# Patient Record
Sex: Male | Born: 1955 | Race: Black or African American | Hispanic: No | Marital: Single | Smoking: Current every day smoker
Health system: Southern US, Community
[De-identification: ages and names within clinical notes are randomized; demographics above are authoritative.]

## PROBLEM LIST (undated history)

## (undated) DIAGNOSIS — F329 Major depressive disorder, single episode, unspecified: Secondary | ICD-10-CM

## (undated) DIAGNOSIS — J811 Chronic pulmonary edema: Secondary | ICD-10-CM

## (undated) DIAGNOSIS — R51 Headache: Secondary | ICD-10-CM

## (undated) DIAGNOSIS — R079 Chest pain, unspecified: Secondary | ICD-10-CM

## (undated) DIAGNOSIS — M5030 Other cervical disc degeneration, unspecified cervical region: Secondary | ICD-10-CM

## (undated) DIAGNOSIS — R519 Headache, unspecified: Secondary | ICD-10-CM

## (undated) DIAGNOSIS — I219 Acute myocardial infarction, unspecified: Secondary | ICD-10-CM

## (undated) DIAGNOSIS — I200 Unstable angina: Secondary | ICD-10-CM

## (undated) DIAGNOSIS — M5430 Sciatica, unspecified side: Secondary | ICD-10-CM

## (undated) DIAGNOSIS — I10 Essential (primary) hypertension: Secondary | ICD-10-CM

## (undated) DIAGNOSIS — F32A Depression, unspecified: Secondary | ICD-10-CM

## (undated) DIAGNOSIS — E119 Type 2 diabetes mellitus without complications: Secondary | ICD-10-CM

## (undated) DIAGNOSIS — M549 Dorsalgia, unspecified: Secondary | ICD-10-CM

## (undated) DIAGNOSIS — I2510 Atherosclerotic heart disease of native coronary artery without angina pectoris: Secondary | ICD-10-CM

## (undated) DIAGNOSIS — J9690 Respiratory failure, unspecified, unspecified whether with hypoxia or hypercapnia: Secondary | ICD-10-CM

## (undated) DIAGNOSIS — Z720 Tobacco use: Secondary | ICD-10-CM

## (undated) DIAGNOSIS — F419 Anxiety disorder, unspecified: Secondary | ICD-10-CM

## (undated) DIAGNOSIS — S82209A Unspecified fracture of shaft of unspecified tibia, initial encounter for closed fracture: Secondary | ICD-10-CM

## (undated) DIAGNOSIS — E7800 Pure hypercholesterolemia, unspecified: Secondary | ICD-10-CM

## (undated) DIAGNOSIS — I509 Heart failure, unspecified: Secondary | ICD-10-CM

## (undated) DIAGNOSIS — I639 Cerebral infarction, unspecified: Secondary | ICD-10-CM

## (undated) DIAGNOSIS — K219 Gastro-esophageal reflux disease without esophagitis: Secondary | ICD-10-CM

## (undated) DIAGNOSIS — G8929 Other chronic pain: Secondary | ICD-10-CM

## (undated) DIAGNOSIS — M1990 Unspecified osteoarthritis, unspecified site: Secondary | ICD-10-CM

## (undated) DIAGNOSIS — T300 Burn of unspecified body region, unspecified degree: Secondary | ICD-10-CM

## (undated) DIAGNOSIS — G4730 Sleep apnea, unspecified: Secondary | ICD-10-CM

## (undated) DIAGNOSIS — F99 Mental disorder, not otherwise specified: Secondary | ICD-10-CM

## (undated) DIAGNOSIS — Z95810 Presence of automatic (implantable) cardiac defibrillator: Secondary | ICD-10-CM

## (undated) DIAGNOSIS — I429 Cardiomyopathy, unspecified: Secondary | ICD-10-CM

## (undated) HISTORY — PX: HERNIA REPAIR: SHX51

## (undated) HISTORY — PX: SKIN GRAFT: SHX250

## (undated) HISTORY — PX: INCISIONAL HERNIA REPAIR: SHX193

---

## 1995-10-30 HISTORY — PX: ABDOMINAL EXPLORATION SURGERY: SHX538

## 2006-04-14 ENCOUNTER — Encounter: Payer: Self-pay | Admitting: Emergency Medicine

## 2006-04-14 ENCOUNTER — Inpatient Hospital Stay (HOSPITAL_COMMUNITY): Admission: EM | Admit: 2006-04-14 | Discharge: 2006-04-17 | Payer: Self-pay | Admitting: Emergency Medicine

## 2006-04-14 ENCOUNTER — Ambulatory Visit: Payer: Self-pay | Admitting: *Deleted

## 2006-04-16 ENCOUNTER — Encounter: Payer: Self-pay | Admitting: Internal Medicine

## 2006-05-17 ENCOUNTER — Ambulatory Visit: Payer: Self-pay | Admitting: *Deleted

## 2006-05-31 ENCOUNTER — Ambulatory Visit: Payer: Self-pay | Admitting: Cardiology

## 2007-10-29 ENCOUNTER — Ambulatory Visit: Payer: Self-pay | Admitting: Cardiology

## 2007-11-10 ENCOUNTER — Ambulatory Visit: Payer: Self-pay | Admitting: Cardiology

## 2008-10-12 ENCOUNTER — Ambulatory Visit: Payer: Self-pay | Admitting: Cardiology

## 2011-03-13 NOTE — Assessment & Plan Note (Signed)
[facility]                          [place] [facility] OFFICE NOTE   Daniel Li, Daniel Li                  MRN:          [id number]  DATE:10/29/2007                            DOB:          1956/06/12    REFERRING PHYSICIAN:  Ernestine Conrad, MD   REASON FOR VISIT:  Transfer of medical care from our [place] office  to the [facility] office.   HISTORY OF PRESENT ILLNESS:  The patient is an [place] American male  with prior history of myocardial infarction in June 2007.  The patient  had heart catheterization which showed severe three vessel disease with  preserved LV function.  However, decision was made to treat him  percutaneously.  He underwent bare metal stenting of the distant  circumflex and bare metal stenting to the diagonal branch with total  occluded right collateral.  The patient reports that he has had some  increase symptoms of substernal chest pain when walking. He denies,  however, orthopnea or PND.  He has no palpitations or syncope.  He uses  quite a bit of Naproxen secondary to arthritis of the hip.  Unfortunately, he has ongoing tobacco use.  The patient is also not sure  of his medications in due part to changing insurance.  Apparently, he is  not taking Lopressor, Lisinopril or Cardizem nor his Niacin.   CURRENT MEDICATIONS:  1. Zantac 150 mg p.o. b.i.d.  2. Aspirin 325 mg p.o. daily.  3. Plavix 75 mg p.o. daily.  4. Lipitor 80 mg p.o. daily.  5. Glipizide 10 mg p.o. daily.  6. Lasix 40 mg p.o. daily.  7. Fish oil 1000 mg p.o. daily.  8. Diovan/Hydrochlorothiazide 26/12.5 mg p.o. daily.  9. Protonix 40 mg p.o. daily.  10.Naproxen 5 mg p.o. b.i.d.  11.Flexeril 10 mg p.o. daily.   PHYSICAL EXAMINATION:  VITAL SIGNS:  Blood pressure 150/102, heart rate  108, weight 192 pounds.  NECK:  Normal carotid upstrokes.  No carotid bruits.  LUNGS:  Clear breath sounds bilaterally.  HEART:  Regular rate and rhythm.  Normal S1, S2.  No  murmurs, rubs or  gallops.  ABDOMEN:  Soft, nontender, no rebound or guarding.  Good bowel sounds.  EXTREMITIES:  No cyanosis, clubbing or edema.  NEUROLOGICAL:  The patient is alert, oriented and grossly nonfocal.   PROBLEMS:  1. Coronary artery disease.      a.     Status post stent placement x2 to circumflex and diagonal       vessel.      b.     Significant residual coronary artery disease.      c.     Exertional angina.      d.     Normal LV systolic function __________ .  2. Multiple cardiac risk factors.  3. Dyslipidemia (followed by Dr. Loney Hering).  4. Tobacco use.  5. Hypertension, poorly controlled.   PLAN:  1. The patient will be started back on his previous medications, in      particular, Lopressor and Lisinopril for better blood pressure      control.  2. The patient appears to  be having angina, and we have added Imdur 30      mg p.o. daily to his medical regimen.  He has also been scheduled      for an echocardiogram and Cardiolite study for further evaluation.  3. I will encourage the patient to discontinue tobacco use.  4. The patient will follow up with [place] a few months earlier pending the      results of the above studies.     Learta Codding, MD,FACC  Electronically Signed    GED/MedQ  DD: 10/31/2007  DT: 10/31/2007  Job #: [id number]   cc:   Ernestine Conrad, MD

## 2011-03-16 NOTE — Assessment & Plan Note (Signed)
[facility]                         [place] [facility] OFFICE NOTE   Daniel Li, Daniel Li                  MRN:          [id number]  DATE:05/17/2006                            DOB:          [birth year]    PRIMARY CARE PHYSICIAN:  [facility].  I think he  has only been there one time.   This is a gentleman who has coronary artery disease, status post acute  myocardial infarction in the middle of June.  He had a heart catheterization  which showed severe three vessel coronary disease with preserved LV systolic  function.  The decision was made to treat him with percutaneous  revascularization.  He received a bare metal stent to his distal circumflex  and a bare metal stent to his diagonal branch with an totally occluded right  coronary artery which continues to be occluded.  His ejection fraction was  55% at that point.  He has hypertension, hyperlipidemia, diabetes mellitus,  ongoing tobacco abuse, and a history of alcohol abuse.  He is on a myriad of  medications and complains of a cough ever since he started taking his  lisinopril.   CURRENT MEDICATIONS:  1.  Zantac 150 twice a day.  2.  Aspirin 325 mg once a day.  3.  Lopressor 50 twice a day.  4.  Plavix 75 mg once a day.  5.  Lisinopril 20 mg a day.  6.  Lipitor 80 mg a day.  7.  Cardizem 120 mg a day.  8.  Niaspan 250 mg a day.  9.  Glipizide 10 mg a day.  10. Lasix 40 mg a day.  11. Fish oil 1000 mg once a day.  12. Takes nitroglycerin on an as-needed basis.   Has an occasional episode of chest discomfort, which is not limiting.  He  continues to smoke, unfortunately, and we talked a little bit about that.  His other limitation is that he has some limited financial reserve and is  waiting to start cardiac rehab once this has been approved.   PHYSICAL EXAMINATION:  VITAL SIGNS:  Blood pressure 144/94, pulse 81.  GENERAL:  He is an obese black male in  no apparent distress, alert and  oriented x4.  CHEST:  Decreased breath sounds at the bases.  CARDIOVASCULAR:  Irregular.  He has no jugular venous distention or carotid  bruits.  I do not hear a murmur.  His point of maximal impulse is not  palpable.  His cath site in his groin is well healed.  He has 1+ pulses.  There are no bruits.  EXTREMITIES:  There is no lower extremity edema.   His electrocardiogram shows an old anterior septal myocardial infarction  with no ischemic ST-T wave changes.  His R wave transition is in V4.  I have  no old EKGs for comparison.   So, this is a gentleman with several issues.  His coronary artery disease  appears to be substantial and has not been completely vascularized.  His  discomfort in his chest, however, is [place] Cardiovascular Society Class  I, under reasonable treatment.  His second issue are his multiple cardiac risk factors, which were  previously completely untreated but are now under the care of the [facility] Department.  His blood pressure is not adequately controlled  and will need to address that.  My plan is to stop his lisinopril because of  the cough and add Diovan 160 with 12.5 mg of hydrochlorothiazide in an  attempt to improve his blood pressure management.  We will have to pay close  attention to his potassium and his renal function.  Still, he is coming back  to see [place] in two weeks for blood pressure check.  At that point, we will  check some basic metabolic panel to insure that he has not had any worsening  problems.   With regard to his cigarette smoking, I have encouraged him to stop smoking.  He is pending cardiac rehab, so hopefully  get into a smoking cessation  program when he does that.  His lipids are aggressively treated currently  and will need to check some lipid functions probably in about three months.  Will see him back at that time.                                   Farris Has. Dorethea Clan,  MD   JMH/MedQ  DD:  05/17/2006  DT:  05/17/2006  Job #:  [id number]

## 2011-03-16 NOTE — H&P (Signed)
Daniel Li, Daniel Li           ACCOUNT NO.:  [id number]   MEDICAL RECORD NO.:  [id number]          PATIENT TYPE:  INP   LOCATION:  2020                         FACILITY:  [facility]   PHYSICIAN:  Arvilla Meres, M.D. LHCDATE OF BIRTH:  Sep 24, 1956   DATE OF ADMISSION:  04/14/2006  DATE OF DISCHARGE:                                HISTORY & PHYSICAL   CHIEF COMPLAINT:  Chest pain.   HISTORY OF PRESENT ILLNESS:  This is a 55 year old male with a history of  diabetes who presents with substernal chest pain.  He originally presented  to [facility] emergency room and was transferred to [facility] for further  evaluation.  He describes the chest pain as intense, 10/10, and is  refractory to several doses of nitroglycerin and Tylenol.   PAST MEDICAL HISTORY:  Alcohol abuse and long-standing diabetes.   MEDICATIONS:  The patient is unaware of all of his medications, but thinks  that he takes:  1.  Duragesic patch for back pain.  2.  Glipizide.   ALLERGIES:  No known drug allergies.   SOCIAL HISTORY:  Greater than 40 pack year smoking history, and history of  tobacco abuse.   REVIEW OF SYSTEMS:  Otherwise negative, besides what is reported in the HPI.   PHYSICAL EXAMINATION:  VITAL SIGNS:  Temperature was 110/70 in the right  arm, 107/90 in the left arm, pulse is 80, respiratory rate 18, pulse  oximetry 98% on room air.  NECK:  Supple.  LUNGS:  Clear to auscultation bilaterally.  CARDIOVASCULAR:  Demonstrates a laterally displaced S1 and S2 with no  murmurs, rubs, or gallops.  ABDOMEN:  Soft and nontender.  EXTREMITIES:  No clubbing, cyanosis, or edema.  NEUROLOGIC:  Cranial nerves II-XII intact with no focal deficits.  Pulses  are 2+ throughout with normal contour.   ELECTROCARDIOGRAM:  Normal sinus rhythm with no ST segment changes.   ASSESSMENT AND PLAN:  This is a 55 year old gentleman with atypical chest  pain.  A primary concern was given that the intensity of his chest  pain  seems out of proportion to his physical examination.  Will rule out for  aortic dissection with a CT of the thorax.  I have a low suspicion for this,  but will make it a consideration.  Other  possibilities include GERD.  Will start the patient on a proton pump  inhibitor, and will consider acute coronary syndrome with ruling out enzymes  and telemetry.  Will also place on sliding scale insulin for his diabetes,  check a hemoglobin A1C, a thyroid panel, lipid panel, initiate ACE inhibitor  and beta blockers.      [name], MD       ______________________________  Arvilla Meres, M.D. [facility]    DBH/MEDQ  D:  04/14/2006  T:  04/15/2006  Job:  [id number]

## 2011-03-16 NOTE — Discharge Summary (Signed)
NAMEJAMY, CLECKLER           ACCOUNT NO.:  [id number]   MEDICAL RECORD NO.:  [id number]          PATIENT TYPE:  INP   LOCATION:  6524                         FACILITY:  [facility]   PHYSICIAN:  Charlton Haws, M.D.     DATE OF BIRTH:  04/12/1956   DATE OF ADMISSION:  04/14/2006  DATE OF DISCHARGE:  04/17/2006                                 DISCHARGE SUMMARY   PHYSICIAN:  Primary cardiologist will be either Dr. Dorethea Clan or Dr. Dietrich Pates  in [place].   PRINCIPAL DIAGNOSIS:  Non-ST elevation myocardial infarction/coronary artery  disease.   OTHER DIAGNOSES:  1.  Hypertension.  2.  Hyperlipidemia.  3.  Type 2 diabetes mellitus.  4.  Ongoing tobacco abuse.  5.  Ethanol abuse.   ALLERGIES:  NO KNOWN DRUG ALLERGIES.   PROCEDURE:  1.  Left heart cardiac catheterization.  2.  PCI and stenting of the distal left circumflex and first diagonal with      bare-metal stents.   HISTORY OF PRESENT ILLNESS:  This patient is a 55 year old, African-American  male without prior history of CAD who presented to the [facility] emergency  department on April 14, 2006 with complaints of tenderness and chest pain  refractory to sublingual nitroglycerin and Tylenol.  His ECG showed no acute  ST or T-wave changes and he was transferred to [facility] for further  evaluation.   HOSPITAL COURSE:  Following arrival to [facility], he ruled in for non-ST  elevation MI with peak CK of 607, CK-MB of 14.4 and troponin-I of 1.29.  He  was initiated on aspirin, statin, beta blocker and ACE inhibitor therapy.  He underwent left heart cardiac catheterization on April 15, 2006, which  revealed an 80 percent stenosis in the first diagonal and an 80 percent  ostial stenosis in a small second diagonal, an 80 percent stenosis in the  distal second obtuse marginal, and a subtotal occlusion of the  posterolateral artery with right-to-right collaterals.  Ejection fraction  was 55 percent.  It was felt the infarct vessel  was likely the  posterolateral artery, which was not felt to be amenable to PCI.  Films were  reviewed with Dr. Juanda Chance and the decision was made to pursue PCI of the  distal left circumflex and first diagonal.  PCI took place on April 16, 2006  with placement of a 2.75 x 18 millimeter micro-driver MX-squared bare-metal  stent in the second obtuse marginal, and a placement of a 2.25 x 14  millimeter micro-driver MX-squared stent in the first diagonal.  He  tolerated this procedure well and postprocedure, his CK-MB is 3.1.  He has  been ambulating without limitations or recurrent chest discomfort and he is  being discharged home today in satisfactory condition.   DISCHARGE LABORATORY DATA:  Hemoglobin 13.6 and hematocrit 41.0, white blood  cell count 7.9, platelet count 198,000.  Sodium 144, potassium 4.0, chloride  106, CO2 of 31, BUN 8 and creatinine 1.2, glucose 102, calcium 9.5.  Peak CK  607, CK-MB 14.4, peak troponin-I 1.29.  Total cholesterol 221, triglycerides  151, HDL 40, LDL 151.  TSH  1.389.   DISPOSITION:  The patient is being discharged home today in good condition.   FOLLOW UP:  Follow-up appointment:  The patient will follow-up at [facility] in [place] in approximately two weeks.  He will be  contacted with an appointment.   DISCHARGE MEDICATIONS:  1.  Aspirin 325 mg p.o. daily.  2.  Plavix 75 mg p.o. daily.  3.  Lopressor 100 mg half a tablet p.o. b.i.d.  4.  Lisinopril 20 mg daily.  5.  Lipitor 80 mg daily.  6.  Glipizide as previously prescribed.  7.  Nitroglycerin 0.4 mg sublingual p.r.n. chest pain.   Outstanding labs and studies:  None.  Duration of discharge encounter:  45  minutes including physician time.      Ok Anis, NP    ______________________________  Charlton Haws, M.D.    CRB/MEDQ  D:  04/17/2006  T:  04/17/2006  Job:  [id number]   cc:   [facility]  [place]

## 2011-03-16 NOTE — Cardiovascular Report (Signed)
NAMEOAK, DOREY           ACCOUNT NO.:  [id number]   MEDICAL RECORD NO.:  [id number]          PATIENT TYPE:  INP   LOCATION:  6524                         FACILITY:  [facility]   PHYSICIAN:  Charlies Constable, M.D. [facility] DATE OF BIRTH:  May 15, 1956   DATE OF PROCEDURE:  04/16/2006  DATE OF DISCHARGE:                              CARDIAC CATHETERIZATION   PROCEDURES:  Percutaneous coronary intervention.   HISTORY:  Mr. Nielson is 55 years old and was admitted to the hospital  with chest pain, and his enzymes returned positive consistent with a non-ST-  elevation infarction.  He was studied yesterday by Dr. Eden Emms and found to  have total occlusion of the distal right coronary artery, 95% stenosis in  the distal circumflex artery, and 90% stenosis in the first diagonal branch  which is a moderate-sized vessel, and 95% stenosis at the ostium of second  diagonal branch which was a small vessel.  We elected to perform  intervention on the distal circumflex artery and first diagonal branch  today.   The procedure was performed via the right femoral artery using an arterial  sheath and a 6-French CLS-4 guiding catheter with side holes.  We passed a  Prowater wire down the circumflex artery across the lesion without  difficulty.  We predilated with a 2.25 x 50 mm Maverick, performing one  inflation up to 8 atmospheres for 30 seconds.  We then deployed a 2.75 x 18  mm driver bare metal stent with one inflation of 11 atmospheres for 30  seconds, and we postdilated 3.0 x 15 mm Quantum Maverick, performing two  inflations up to 15 atmospheres for 30 seconds.   We then approached the first diagonal branch of the LAD.  We wired the  vessel with the Prowater wire.  We predilated with a 2.25 x 50 mm Maverick,  performing one inflation to 7 atmospheres for 30 seconds.  We then deployed  a 2.25 x 14 mm driver, deploying this with one inflation of 8 atmospheres  for 30 seconds.  We postdilated with a  2.25 x 12 mm Quantum Maverick,  performing two inflations up to 15 atmospheres for 30 seconds.  Final  diagnostic study was then performed through the guiding catheter.  The  patient tolerated the procedure well and left the laboratory in satisfactory  condition.   RESULTS:  Initially stenosis in the  distal circumflex artery was estimated  at 95%.  Following stenting, this improved to 0%.   Initially stenosis in the first diagonal branch, which was just past the  ostium, was estimated at 95%.  Following stenting, this improved to 0%.   CONCLUSION:  1.  Successful percutaneous intervention of the lesion in the distal      circumflex artery using a driver bare metal stent with improvement in      stent narrowing from 95% to 0%.  2.  Successful percutaneous intervention of the lesion in the first diagonal      branch of the left anterior descending  using a driver bare metal stent      with improvement in stent narrowing from 95%  to 0%.   DISPOSITION:  The patient was then returned further observation.           ______________________________  Charlies Constable, M.D. [facility]     BB/MEDQ  D:  04/16/2006  T:  04/16/2006  Job:  [id number]   cc:   Charlton Haws, M.D.  [address]  [address]  [place]  [place] [address]   Cardiopulmonary Lab

## 2011-03-16 NOTE — Cardiovascular Report (Signed)
NAMEPRATHAM, CASSATT           ACCOUNT NO.:  [id number]   MEDICAL RECORD NO.:  [id number]          PATIENT TYPE:  INP   LOCATION:  2020                         FACILITY:  [facility]   PHYSICIAN:  Charlton Haws, M.D.     DATE OF BIRTH:  1956/10/01   DATE OF PROCEDURE:  DATE OF DISCHARGE:                              CARDIAC CATHETERIZATION   Ms. Want is a 54 year old patient with diabetes.  He was admitted to  the hospital with a probable subendocardial MI and chest pain.  Catheterization was indicated to clarify degree of coronary artery stenosis.  Catheterization was done using a 6-French sheath in the right femoral  artery.   The left main coronary artery had a 20% distal stenosis.   The left anterior descending artery proper was normal.   The first diagonal branch had 80% multiple discrete lesions.   The second diagonal branch was small with an 80% ostial lesion.   The circumflex coronary artery was a large caliber vessel with 40-50% distal  disease.  A second obtuse marginal branch had an 80% eccentric lesion.  The  superior branch had 50% discrete lesions.   The right coronary artery was dominant.  There were 20-30% multiple discrete  lesions distally.  The posterolateral branch appeared subtotally occluded  with right-to-right collaterals.   I suspect this was his infarct vessel.  It did not look very amenable to  percutaneous intervention.   RIGHT VENTRICULOGRAPHY:  Right ventriculography showed an EF of 55%.  There  was no gradient across the aortic valve and no MR.  Aortic pressure was  142/87.  LV pressure was 140/19.   IMPRESSION:  I suspect the patient's infarct artery was the posterolateral  branch.  I do not think that this should be treated percutaneously; however,  he still has myocardium in jeopardy in the first diagonal branch and obtuse  marginal branch lesions.   We will have our interventionalist review this.  It may be worthwhile to  bring him  back for intervention of the diagonal and/or the obtuse marginal  branch.           ______________________________  Charlton Haws, M.D.     PN/MEDQ  D:  04/15/2006  T:  04/16/2006  Job:  [id number]

## 2012-01-14 ENCOUNTER — Encounter (HOSPITAL_COMMUNITY): Payer: Self-pay | Admitting: Oncology

## 2012-01-14 ENCOUNTER — Emergency Department (HOSPITAL_COMMUNITY)
Admission: EM | Admit: 2012-01-14 | Discharge: 2012-01-14 | Disposition: A | Discharge status: Home / Self Care | Payer: Medicaid Other | Attending: Emergency Medicine | Admitting: Emergency Medicine

## 2012-01-14 ENCOUNTER — Emergency Department (HOSPITAL_COMMUNITY): Payer: Medicaid Other

## 2012-01-14 DIAGNOSIS — M5431 Sciatica, right side: Secondary | ICD-10-CM

## 2012-01-14 DIAGNOSIS — M5430 Sciatica, unspecified side: Secondary | ICD-10-CM | POA: Insufficient documentation

## 2012-01-14 DIAGNOSIS — M545 Low back pain, unspecified: Secondary | ICD-10-CM | POA: Insufficient documentation

## 2012-01-14 DIAGNOSIS — M25559 Pain in unspecified hip: Secondary | ICD-10-CM | POA: Insufficient documentation

## 2012-01-14 DIAGNOSIS — G8929 Other chronic pain: Secondary | ICD-10-CM

## 2012-01-14 DIAGNOSIS — M79609 Pain in unspecified limb: Secondary | ICD-10-CM | POA: Insufficient documentation

## 2012-01-14 HISTORY — DX: Cerebral infarction, unspecified: I63.9

## 2012-01-14 HISTORY — DX: Unspecified fracture of shaft of unspecified tibia, initial encounter for closed fracture: S82.209A

## 2012-01-14 HISTORY — DX: Burn of unspecified body region, unspecified degree: T30.0

## 2012-01-14 MED ORDER — HYDROCODONE-ACETAMINOPHEN 5-325 MG PO TABS
1.0000 | ORAL_TABLET | Freq: Once | ORAL | Status: AC
  Administered 2012-01-14: 1 via ORAL
  Filled 2012-01-14: qty 1

## 2012-01-14 MED ORDER — GABAPENTIN 100 MG PO CAPS: 100.0000 mg | ORAL_CAPSULE | Freq: Three times a day (TID) | ORAL | Status: DC

## 2012-01-14 MED ORDER — HYDROCODONE-ACETAMINOPHEN 5-325 MG PO TABS: ORAL_TABLET | ORAL | Status: DC

## 2012-01-14 NOTE — Discharge Instructions (Signed)
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
## 2012-03-26 ENCOUNTER — Other Ambulatory Visit: Payer: Self-pay

## 2012-03-26 ENCOUNTER — Encounter (HOSPITAL_COMMUNITY)
Admission: EM | Disposition: A | Discharge status: Home / Self Care | Payer: Self-pay | Source: Home / Self Care | Attending: Cardiovascular Disease

## 2012-03-26 ENCOUNTER — Encounter (HOSPITAL_COMMUNITY): Payer: Self-pay | Admitting: *Deleted

## 2012-03-26 ENCOUNTER — Emergency Department (HOSPITAL_COMMUNITY): Payer: Medicaid Other

## 2012-03-26 ENCOUNTER — Inpatient Hospital Stay (HOSPITAL_COMMUNITY)
Admission: EM | Admit: 2012-03-26 | Discharge: 2012-03-28 | DRG: 249 | Disposition: A | Discharge status: Home / Self Care | Payer: Medicaid Other | Attending: Cardiovascular Disease | Admitting: Cardiovascular Disease

## 2012-03-26 ENCOUNTER — Ambulatory Visit (HOSPITAL_COMMUNITY): Admit: 2012-03-26 | Payer: Self-pay | Admitting: Cardiovascular Disease

## 2012-03-26 DIAGNOSIS — I2510 Atherosclerotic heart disease of native coronary artery without angina pectoris: Secondary | ICD-10-CM

## 2012-03-26 DIAGNOSIS — F17200 Nicotine dependence, unspecified, uncomplicated: Secondary | ICD-10-CM | POA: Diagnosis present

## 2012-03-26 DIAGNOSIS — Z7982 Long term (current) use of aspirin: Secondary | ICD-10-CM

## 2012-03-26 DIAGNOSIS — I219 Acute myocardial infarction, unspecified: Secondary | ICD-10-CM

## 2012-03-26 DIAGNOSIS — I10 Essential (primary) hypertension: Secondary | ICD-10-CM | POA: Diagnosis present

## 2012-03-26 DIAGNOSIS — E785 Hyperlipidemia, unspecified: Secondary | ICD-10-CM | POA: Diagnosis present

## 2012-03-26 DIAGNOSIS — Z9861 Coronary angioplasty status: Secondary | ICD-10-CM

## 2012-03-26 DIAGNOSIS — E118 Type 2 diabetes mellitus with unspecified complications: Secondary | ICD-10-CM | POA: Diagnosis present

## 2012-03-26 DIAGNOSIS — I2119 ST elevation (STEMI) myocardial infarction involving other coronary artery of inferior wall: Principal | ICD-10-CM | POA: Diagnosis present

## 2012-03-26 DIAGNOSIS — Z8673 Personal history of transient ischemic attack (TIA), and cerebral infarction without residual deficits: Secondary | ICD-10-CM

## 2012-03-26 DIAGNOSIS — Z720 Tobacco use: Secondary | ICD-10-CM | POA: Insufficient documentation

## 2012-03-26 DIAGNOSIS — I213 ST elevation (STEMI) myocardial infarction of unspecified site: Secondary | ICD-10-CM

## 2012-03-26 DIAGNOSIS — I252 Old myocardial infarction: Secondary | ICD-10-CM

## 2012-03-26 DIAGNOSIS — Z955 Presence of coronary angioplasty implant and graft: Secondary | ICD-10-CM

## 2012-03-26 DIAGNOSIS — I498 Other specified cardiac arrhythmias: Secondary | ICD-10-CM | POA: Diagnosis not present

## 2012-03-26 DIAGNOSIS — E7800 Pure hypercholesterolemia, unspecified: Secondary | ICD-10-CM | POA: Insufficient documentation

## 2012-03-26 DIAGNOSIS — E119 Type 2 diabetes mellitus without complications: Secondary | ICD-10-CM | POA: Diagnosis present

## 2012-03-26 HISTORY — DX: Essential (primary) hypertension: I10

## 2012-03-26 HISTORY — DX: Atherosclerotic heart disease of native coronary artery without angina pectoris: I25.10

## 2012-03-26 HISTORY — DX: Pure hypercholesterolemia, unspecified: E78.00

## 2012-03-26 HISTORY — DX: Tobacco use: Z72.0

## 2012-03-26 HISTORY — PX: LEFT HEART CATH: SHX5478

## 2012-03-26 LAB — CARDIAC PANEL(CRET KIN+CKTOT+MB+TROPI)
CK, MB: 10.6 ng/mL (ref 0.3–4.0)
CK, MB: 30.8 ng/mL (ref 0.3–4.0)
CK, MB: 35.3 ng/mL (ref 0.3–4.0)
Relative Index: 3.2 — ABNORMAL HIGH (ref 0.0–2.5)
Total CK: 973 U/L — ABNORMAL HIGH (ref 7–232)
Total CK: 1123 U/L — ABNORMAL HIGH (ref 7–232)
Troponin I: 14.44 ng/mL (ref <0.30)

## 2012-03-26 LAB — CBC
Hemoglobin: 14.4 g/dL (ref 13.0–17.0)
Hemoglobin: 12.9 g/dL — ABNORMAL LOW (ref 13.0–17.0)
MCH: 27.5 pg (ref 26.0–34.0)
MCH: 27 pg (ref 26.0–34.0)
MCHC: 33.4 g/dL (ref 30.0–36.0)
MCHC: 33.2 g/dL (ref 30.0–36.0)
MCV: 82.3 fL (ref 78.0–100.0)
MCV: 81.4 fL (ref 78.0–100.0)
Platelets: 165 10*3/uL (ref 150–400)
RBC: 5.24 MIL/uL (ref 4.22–5.81)
RBC: 4.78 MIL/uL (ref 4.22–5.81)
RDW: 16.3 % — ABNORMAL HIGH (ref 11.5–15.5)
WBC: 11.1 10*3/uL — ABNORMAL HIGH (ref 4.0–10.5)

## 2012-03-26 LAB — BASIC METABOLIC PANEL
CO2: 24 mEq/L (ref 19–32)
Calcium: 8.8 mg/dL (ref 8.4–10.5)
Chloride: 101 mEq/L (ref 96–112)
Creatinine, Ser: 0.9 mg/dL (ref 0.50–1.35)
GFR calc non Af Amer: >90 mL/min (ref >90)
Glucose, Bld: 232 mg/dL — ABNORMAL HIGH (ref 70–99)
Glucose, Bld: 169 mg/dL — ABNORMAL HIGH (ref 70–99)
Sodium: 139 mEq/L (ref 135–145)

## 2012-03-26 LAB — LIPID PANEL
Cholesterol: 236 mg/dL — ABNORMAL HIGH (ref 0–200)
HDL: 60 mg/dL (ref >39)
Total CHOL/HDL Ratio: 3.9 RATIO
Triglycerides: 84 mg/dL (ref <150)
VLDL: 17 mg/dL (ref 0–40)

## 2012-03-26 LAB — POCT I-STAT TROPONIN I: Troponin i, poc: 0.01 ng/mL (ref 0.00–0.08)

## 2012-03-26 LAB — GLUCOSE, CAPILLARY
Glucose-Capillary: 166 mg/dL — ABNORMAL HIGH (ref 70–99)
Glucose-Capillary: 117 mg/dL — ABNORMAL HIGH (ref 70–99)

## 2012-03-26 LAB — PRO B NATRIURETIC PEPTIDE: Pro B Natriuretic peptide (BNP): 25.2 pg/mL (ref 0–125)

## 2012-03-26 LAB — HEMOGLOBIN A1C: Hgb A1c MFr Bld: 6.8 % — ABNORMAL HIGH (ref <5.7)

## 2012-03-26 SURGERY — LEFT HEART CATH
Anesthesia: LOCAL

## 2012-03-26 MED ORDER — SODIUM CHLORIDE 0.9 % IV SOLN
1.0000 mL/kg/h | INTRAVENOUS | Status: AC
  Administered 2012-03-26: 1 mL/kg/h via INTRAVENOUS

## 2012-03-26 MED ORDER — ATORVASTATIN CALCIUM 40 MG PO TABS
40.0000 mg | ORAL_TABLET | Freq: Every day | ORAL | Status: DC
  Administered 2012-03-26: 40 mg via ORAL
  Filled 2012-03-26 (×2): qty 1

## 2012-03-26 MED ORDER — MORPHINE SULFATE 2 MG/ML IJ SOLN: 2.0000 mg | INTRAMUSCULAR | Status: DC | PRN

## 2012-03-26 MED ORDER — NITROGLYCERIN IN D5W 200-5 MCG/ML-% IV SOLN
10.0000 ug/min | INTRAVENOUS | Status: DC
  Administered 2012-03-26: 5 ug/min via INTRAVENOUS
  Filled 2012-03-26: qty 250

## 2012-03-26 MED ORDER — LIDOCAINE HCL (PF) 1 % IJ SOLN
INTRAMUSCULAR | Status: AC
  Filled 2012-03-26: qty 30

## 2012-03-26 MED ORDER — CLOPIDOGREL BISULFATE 300 MG PO TABS
300.0000 mg | ORAL_TABLET | Freq: Every day | ORAL | Status: DC
  Administered 2012-03-26: 300 mg via ORAL
  Filled 2012-03-26 (×2): qty 1

## 2012-03-26 MED ORDER — ACETAMINOPHEN 325 MG PO TABS: 650.0000 mg | ORAL_TABLET | ORAL | Status: DC | PRN

## 2012-03-26 MED ORDER — MIDAZOLAM HCL 2 MG/2ML IJ SOLN
INTRAMUSCULAR | Status: AC
  Filled 2012-03-26: qty 2

## 2012-03-26 MED ORDER — SODIUM CHLORIDE 0.9 % IV SOLN
1000.0000 mL | Freq: Once | INTRAVENOUS | Status: AC
  Administered 2012-03-26: 1000 mL via INTRAVENOUS

## 2012-03-26 MED ORDER — HYDROCODONE-ACETAMINOPHEN 5-325 MG PO TABS: 1.0000 | ORAL_TABLET | ORAL | Status: DC | PRN

## 2012-03-26 MED ORDER — TICAGRELOR 90 MG PO TABS
90.0000 mg | ORAL_TABLET | Freq: Two times a day (BID) | ORAL | Status: DC
  Administered 2012-03-26 – 2012-03-28 (×5): 90 mg via ORAL
  Filled 2012-03-26 (×6): qty 1

## 2012-03-26 MED ORDER — ASPIRIN 81 MG PO CHEW
81.0000 mg | CHEWABLE_TABLET | Freq: Every day | ORAL | Status: DC
  Administered 2012-03-26 – 2012-03-28 (×3): 81 mg via ORAL
  Filled 2012-03-26 (×3): qty 1

## 2012-03-26 MED ORDER — GABAPENTIN 100 MG PO CAPS
100.0000 mg | ORAL_CAPSULE | Freq: Three times a day (TID) | ORAL | Status: DC
  Administered 2012-03-26 – 2012-03-28 (×7): 100 mg via ORAL
  Filled 2012-03-26 (×9): qty 1

## 2012-03-26 MED ORDER — DOPAMINE-DEXTROSE 3.2-5 MG/ML-% IV SOLN
INTRAVENOUS | Status: AC
  Filled 2012-03-26: qty 250

## 2012-03-26 MED ORDER — HEPARIN (PORCINE) IN NACL 100-0.45 UNIT/ML-% IJ SOLN
12.0000 [IU]/kg/h | INTRAMUSCULAR | Status: DC
  Administered 2012-03-26 (×2): 12 [IU]/kg/h via INTRAVENOUS
  Filled 2012-03-26: qty 250

## 2012-03-26 MED ORDER — FENTANYL CITRATE 0.05 MG/ML IJ SOLN
INTRAMUSCULAR | Status: AC
  Filled 2012-03-26: qty 2

## 2012-03-26 MED ORDER — ONDANSETRON HCL 4 MG/2ML IJ SOLN: 4.0000 mg | Freq: Four times a day (QID) | INTRAMUSCULAR | Status: DC | PRN

## 2012-03-26 MED ORDER — NITROGLYCERIN 0.2 MG/ML ON CALL CATH LAB
INTRAVENOUS | Status: AC
  Filled 2012-03-26: qty 1

## 2012-03-26 MED ORDER — BIOTENE DRY MOUTH MT LIQD
15.0000 mL | Freq: Two times a day (BID) | OROMUCOSAL | Status: DC
  Administered 2012-03-26: 15 mL via OROMUCOSAL

## 2012-03-26 MED ORDER — ASPIRIN 81 MG PO CHEW
CHEWABLE_TABLET | ORAL | Status: AC
  Administered 2012-03-26: 324 mg via ORAL
  Filled 2012-03-26: qty 4

## 2012-03-26 MED ORDER — METOPROLOL TARTRATE 12.5 MG HALF TABLET
12.5000 mg | ORAL_TABLET | Freq: Two times a day (BID) | ORAL | Status: DC
  Administered 2012-03-26 – 2012-03-28 (×5): 12.5 mg via ORAL
  Filled 2012-03-26 (×6): qty 1

## 2012-03-26 MED ORDER — INSULIN ASPART 100 UNIT/ML ~~LOC~~ SOLN: 0.0000 [IU] | Freq: Three times a day (TID) | SUBCUTANEOUS | Status: DC

## 2012-03-26 MED ORDER — HEPARIN (PORCINE) IN NACL 2-0.9 UNIT/ML-% IJ SOLN
INTRAMUSCULAR | Status: AC
  Filled 2012-03-26: qty 2000

## 2012-03-26 MED ORDER — GLIPIZIDE ER 10 MG PO TB24
10.0000 mg | ORAL_TABLET | Freq: Every day | ORAL | Status: DC
  Administered 2012-03-26 – 2012-03-28 (×3): 10 mg via ORAL
  Filled 2012-03-26 (×4): qty 1

## 2012-03-26 MED ORDER — SODIUM CHLORIDE 0.9 % IV SOLN: 1000.0000 mL | INTRAVENOUS | Status: DC

## 2012-03-26 MED ORDER — QUETIAPINE FUMARATE ER 50 MG PO TB24
150.0000 mg | ORAL_TABLET | Freq: Every day | ORAL | Status: DC
  Administered 2012-03-26: 150 mg via ORAL
  Filled 2012-03-26 (×2): qty 3

## 2012-03-26 MED ORDER — METOPROLOL TARTRATE 50 MG PO TABS
25.0000 mg | ORAL_TABLET | Freq: Once | ORAL | Status: AC
  Administered 2012-03-26: 25 mg via ORAL
  Filled 2012-03-26: qty 1

## 2012-03-26 MED ORDER — HEPARIN SODIUM (PORCINE) 5000 UNIT/ML IJ SOLN
60.0000 [IU]/kg | Freq: Three times a day (TID) | INTRAMUSCULAR | Status: DC
  Administered 2012-03-26: 5100 [IU] via INTRAVENOUS
  Filled 2012-03-26 (×5): qty 1.02

## 2012-03-26 MED ORDER — MORPHINE SULFATE 2 MG/ML IJ SOLN
2.0000 mg | Freq: Once | INTRAMUSCULAR | Status: AC
  Administered 2012-03-26: 2 mg via INTRAVENOUS
  Filled 2012-03-26: qty 1

## 2012-03-26 MED ORDER — MORPHINE SULFATE 2 MG/ML IJ SOLN
2.0000 mg | Freq: Once | INTRAMUSCULAR | Status: AC
  Administered 2012-03-26: 2 mg via INTRAVENOUS

## 2012-03-26 MED ORDER — BIVALIRUDIN 250 MG IV SOLR
INTRAVENOUS | Status: AC
  Filled 2012-03-26: qty 250

## 2012-03-26 MED ORDER — MORPHINE SULFATE 2 MG/ML IJ SOLN
INTRAMUSCULAR | Status: AC
  Filled 2012-03-26: qty 1

## 2012-03-26 MED ORDER — TICAGRELOR 90 MG PO TABS
ORAL_TABLET | ORAL | Status: AC
  Filled 2012-03-26: qty 2
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
## 2012-03-26 NOTE — ED Notes (Signed)
Initial ECG was hand delivered to Dr. Colon Branch at 00:47. [facility]

## 2012-03-26 NOTE — Progress Notes (Signed)
CARDIAC REHAB PHASE I   PRE:  Rate/Rhythm: 68 SR    BP: sitting 129/90    SaO2: 98 2L, 97 RA  MODE:  Ambulation: 280 ft   POST:  Rate/Rhythm: 87  SR    BP: sitting 136/86     SaO2: 97 RA  Pt c/o some lightheadedness upon standing and walking. Seemed to resolve however did have left knee buckled x3, he sts due to sciatic issues. Pt difficult to understand his speech, sounds thick. Sts he feels groggy, something about something they gave him. Strange countenance. To recliner, gave MI book and stent card. Will f/u in am. [phone]  Harriet Masson CES, ACSM
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
## 2012-03-26 NOTE — Care Management Note (Addendum)
    Page 1 of 1   03/28/2012     10:01:47 AM   CARE MANAGEMENT NOTE 03/28/2012  Patient:  STETSON, PELAEZ   Account Number:  [id number]  Date Initiated:  03/26/2012  Documentation initiated by:  Junius Creamer  Subjective/Objective Assessment:   adm w mi     Action/Plan:   lives w friend, pcp dr Lyda Perone bluth   Anticipated DC Date:  03/28/2012   Anticipated DC Plan:  HOME/SELF CARE      DC Planning Services  CM consult      Choice offered to / List presented to:             Status of service:   Medicare Important Message given?   (If response is "NO", the following Medicare IM given date fields will be blank) Date Medicare IM given:   Date Additional Medicare IM given:    Discharge Disposition:  HOME/SELF CARE  Per UR Regulation:  Reviewed for med. necessity/level of care/duration of stay  If discussed at Long Length of Stay Meetings, dates discussed:    Comments:  03-28-12 0956 Tomi Bamberger, RN,BSN [phone] CM did call [facility] in [place] and they have medication available. CM also placed mediciad # in sticky note for MD/PA to call for prior authorization. MD please write Rx for 30 day supply no refills and then the original with refills. Thanks. CM did give pt a 30 day free card. No further needs from CM at this time.   5/29 9:25a debbie dowell rn,bsn [phone] on brilinta 90mg  bid. have req cm sec ck on copay amt. pt has medicaid for meds but want to be sure no problems w med. has $3.00 copay for brilinta.

## 2012-03-26 NOTE — CV Procedure (Signed)
Cardiac Catheterization Procedure Note  Name: Daniel Li MRN: [id number] DOB: 1956/05/22  Procedure: Left Heart Cath, Selective Coronary Angiography, LV angiography, PTCA and stenting of the RCA  Indication: Acute inferior wall MI.  Procedural Details:  The right wrist was prepped, draped, and anesthetized with 1% lidocaine. Using the modified Seldinger technique, a 6 French sheath was introduced into the right radial artery. 2.5 mg of nicardipine was administered through the sheath, and Angiomax was started for anticoagulation. The patient had been preloaded with 300 mg of Plavix. Standard Judkins catheters were used for selective coronary angiography and left ventriculography. Left ventriculography was done at the completion of the interventional procedure. Catheter exchanges were performed over an exchange length guidewire.  PROCEDURAL FINDINGS Hemodynamics: AO 96/54 LV 96/25   Coronary angiography: Coronary dominance: right  Left mainstem: Patent with mild luminal irregularity.  Left anterior descending (LAD): There are diffuse irregularities throughout. The proximal LAD has scattered 50% stenoses. The LAD after the first diagonal has a hazy 50% stenosis. The first diagonal has a patent stent with no significant in-stent restenosis. However, there is 50% ostial stenosis of this vessel. The second diagonal is small and has mild ostial stenosis. The LAD reaches the left ventricular apex without any critical stenoses  Left circumflex (LCx): The left circumflex has severe diffuse plaque throughout. There are no high-grade stenoses but there is marked irregularity of the vessel. There is an ulcerated-appearing area in the proximal circumflex with associated 30-40% stenosis. The mid circumflex has scattered 30-50% stenoses. The distal circumflex has a patent stent with no significant in-stent restenosis. The obtuse marginal bifurcates and the twin vessels, both of which are diffusely  diseased without severe focal stenoses.  Right coronary artery (RCA): The RCA is totally occluded in the proximal vessel just after the ostium. The distal branch vessels the right coronary artery fill from left to right collaterals.  Left ventriculography: There is mild inferior wall hypokinesis, left ventricular ejection fraction is estimated at 55%.  PCI Note:  Following the diagnostic procedure, the decision was made to proceed with PCI.  The patient had been loaded with 300 mg of Plavix in the emergency room. I decided to reload him with brilinta 180 mg considering his diabetes and acute myocardial infarction. This was administered on the Cath Lab table. Once a therapeutic ACT was achieved, a 6 [place] JR 4 guide catheter was inserted.  A cougar coronary guidewire was used to cross the lesion.  The lesion was predilated with a 2.5 x 15 mm balloon.  The vessel remained subtotally occluded with TIMI 1 flow. The distal portion of the occlusion could be visualized and I felt the lesion should be stented as the best way to reperfuse this patient. A 4.0 x 22 mm integrity bare-metal stent platform was chosen. The stent was deployed at 14 atmospheres. This restored TIMI-3 flow to the vessel. The stent was postdilated with a 4.5 mm noncompliant balloon. Following reperfusion, patient developed marked bradycardia and hypotension and he required intravenous atropine and dopamine. By the end of the procedure, the dopamine was discontinued and the patient was hemodynamically stable.  Following PCI, there was 0% residual stenosis and TIMI-3 flow. There was distal subtotal occlusion of the posterolateral branch, but this was unchanged compared to the previous study from 2007. Final angiography confirmed an excellent result. The patient tolerated the procedure well. There were no immediate procedural complications. A TR band was used for radial hemostasis. The patient was transferred to the post  catheterization recovery  area for further monitoring.  PCI Data: Vessel - RCA/Segment - proximal Percent Stenosis (pre)  100 TIMI-flow 0 Stent 4.0 x 22 mm integrity bare-metal stent Percent Stenosis (post) 0 TIMI-flow (post) 3  Final Conclusions:   1. Acute inferior wall myocardial infarction secondary to total occlusion of the right coronary artery, treated successfully with primary PCI using a bare-metal stent 2. Continued patency of the stented segments in the first diagonal branch of the LAD and second obtuse marginal branch of the left circumflex 3. Severe diffuse coronary irregularity and plaque without associated high-grade stenoses in the left main, LAD, and left circumflex vessels 4. Mild segmental left ventricular systolic dysfunction   Recommendations:  Transferred to the CCU for post MI care. Anticipate hospital discharge in 48 hours if no complications.  Daniel Li 03/26/2012, 3:36 AM

## 2012-03-26 NOTE — Progress Notes (Signed)
CRITICAL VALUE ALERT  Critical value received:  CKMB = 10.6; Troponin =2.06  Date of notification:  03/26/2012  Time of notification:  0527  Critical value read back:yes  Nurse who received alert:  Mayford Knife [address] MD notified (1st page):  No one called; expected results  Time of first page:  MD notified (2nd page):  Time of second page:  Responding MD:  Time MD responded:

## 2012-03-26 NOTE — ED Notes (Signed)
Pt. transferred to [facility] Cath Lab via [facility]

## 2012-03-26 NOTE — Progress Notes (Signed)
Daniel Li Internal Medicine Resident Note  Subjective:  This morning feels very weak but denies any chest pain, SOB, abdominal pain, nausea or vomiting.   Objective:  Vital Signs in the last 24 hours: Filed Vitals:   03/26/12 0600 03/26/12 0615 03/26/12 0630 03/26/12 0700  BP: 120/93 114/84 129/100 123/95  Pulse: 77 81 77   Temp:      TempSrc:      Resp: 12 15 13    Height:      Weight:      SpO2: 100% 99% 100%     Intake/Output from previous day: 05/28 0701 - 05/29 0700 In: 484.2 [P.O.:200; I.V.:284.2] Out: -   24-hour weight change: Weight change:   Weight trends: Filed Weights   03/26/12 0049 03/26/12 0337  Weight: 188 lb (85.276 kg) 181 lb 14.1 oz (82.5 kg)    Physical Exam: Pt is alert and oriented, NAD HEENT: normal Neck: JVP - normal, carotids 2+ equal without bruits Lungs: CTA bilaterally CV: RRR without murmur or gallop Abd: soft, NT, Positive BS,  Ext: no edema, distal pulses intact and equal Skin: warm/dry no rash  Lab Results:  Basename 03/26/12 0430 03/26/12 0057  WBC 8.9 11.1*  HGB 12.9* 14.4  PLT 169 165    Basename 03/26/12 0430 03/26/12 0057  NA 139 140  K 4.0 3.5  CL 105 101  CO2 24 25  GLUCOSE 169* 232*  BUN 11 11  CREATININE 0.90 1.05    Basename 03/26/12 0350  TROPONINI 2.06*    Basename 03/26/12 0356  GLUCAP 166*    Basename 03/26/12 0057  PROBNP 25.2   Cardiac Studies:  Cath 5/29 3:40 am by Dr Excell Seltzer 1. Acute inferior wall myocardial infarction secondary to total occlusion of the right coronary artery, treated successfully with primary PCI using a bare-metal stent  2. Continued patency of the stented segments in the first diagonal branch of the LAD and second obtuse marginal branch of the left circumflex  3. Severe diffuse coronary irregularity and plaque without associated high-grade stenoses in the left main, LAD, and left circumflex vessels  4. Mild segmental left ventricular systolic dysfunction with EF of 55  %   Scheduled Meds:   . sodium chloride  1,000 mL Intravenous Once  . antiseptic oral rinse  15 mL Mouth Rinse BID  . aspirin      . aspirin  81 mg Oral Daily  . atorvastatin  40 mg Oral q1800  . bivalirudin      . DOPamine      . fentaNYL      . gabapentin  100 mg Oral TID  . glipiZIDE  10 mg Oral Q breakfast  . heparin      . insulin aspart  0-15 Units Subcutaneous TID WC  . lidocaine      . metoprolol tartrate  25 mg Oral Once  . metoprolol tartrate  12.5 mg Oral BID  . midazolam      . morphine  2 mg Intravenous Once  . morphine  2 mg Intravenous Once  . nitroGLYCERIN      . QUEtiapine Fumarate  150 mg Oral QHS  . Ticagrelor      . Ticagrelor  90 mg Oral BID  . DISCONTD: clopidogrel  300 mg Oral Q breakfast  . DISCONTD: heparin  60 Units/kg Intravenous Q8H   Continuous Infusions:   . sodium chloride 1 mL/kg/hr (03/26/12 0345)  . DISCONTD: sodium chloride    . DISCONTD: heparin  12 Units/kg/hr (03/26/12 0128)  . DISCONTD: nitroGLYCERIN Stopped (03/26/12 0216)   PRN Meds:.acetaminophen, HYDROcodone-acetaminophen, morphine injection, ondansetron (ZOFRAN) IV  Imaging: Chest Portable 1 View  03/26/2012  *RADIOLOGY REPORT*  Clinical Data: Chest pain  PORTABLE CHEST - 1 VIEW  Comparison: CT chest 04/14/2006  Findings: Shallow inspiration.  Normal heart size and pulmonary vascularity.  No focal airspace consolidation in the lungs.  No blunting of costophrenic angles.  No pneumothorax.  Mildly tortuous aorta.  IMPRESSION: No evidence of active pulmonary disease.  Original Report Authenticated By: Marlon Pel, M.D.   Assessment/Plan:   Pt is a 56 y.o. yo male with a PMHx of CAD s/p PCI in 2007 , DM, Type 2, HLD, Hypertension  who was admitted on 03/26/2012 for chest pain and was found to have STEMI. Acute inferior wall myocardial infarction secondary to total occlusion of the right coronary artery, treated successfully with primary PCI using a bare-metal stent   1.  STEMI: s/p PCI with bare-metal stent to RCA. Patient has history of multivessel disease s/p PCI in 2007. Currently on Aspirin, Brilinta, Statin , Metoprolol. Cardiac rehab phase in place.   2. Hyperlipidemia : LDL 159. Currently on Statin   3. Hypertension: BP well controlled. Currently on metoprolol.  - Consider ACEi if BP stable in the setting of MI and DM   4. DM, Type 2: Hgb A1c  pending - Continue Glipizide and SSI  5. Tobacco abuse. Counseled. Cessation Consult in place  Length of Stay: 0 days  Patient history and plan of care reviewed with attending, Dr. Jerry Caras, M.D. 03/26/2012, 7:08 AM   As above  Pt examined and agree with assessmend and plan as above

## 2012-03-26 NOTE — Progress Notes (Signed)
Received cm consult for cbg meter. Case manager does not get this for pt. Pt will need prescription at disch then picks up at drugstore.

## 2012-03-26 NOTE — Progress Notes (Signed)
Chaplain responded to a spiritual care consult. Chaplain provided presence and listened. The visit concluded when the patient needed to go to the bathroom. Chaplain identified no spiritual care needs at this time. Chaplain will follow up as needed.

## 2012-03-26 NOTE — ED Notes (Signed)
Pt stated started having pain in chest and SOB about 2 hrs ago. Has a previous HX of MI. Pain is generalized over entire chest.

## 2012-03-26 NOTE — Consult Note (Signed)
Pt smokes 6 cigarettes per day down from 1 1/2 ppd. He is in action stage and very eager to quit. Recommended 14 mg patch to start with. Discussed patch use instructions. Pt verbalizes understanding. Referred to 1-800 quit now for f/u and support. Discussed oral fixation substitutes, second hand smoke and in home smoking policy. Reviewed and gave pt Written education/contact information.

## 2012-03-27 DIAGNOSIS — I2510 Atherosclerotic heart disease of native coronary artery without angina pectoris: Secondary | ICD-10-CM

## 2012-03-27 LAB — CARDIAC PANEL(CRET KIN+CKTOT+MB+TROPI)
CK, MB: 10.9 ng/mL (ref 0.3–4.0)
CK, MB: 20.2 ng/mL (ref 0.3–4.0)
Relative Index: 1.6 (ref 0.0–2.5)
Relative Index: 1.8 (ref 0.0–2.5)
Relative Index: 2.4 (ref 0.0–2.5)
Total CK: 611 U/L — ABNORMAL HIGH (ref 7–232)
Total CK: 846 U/L — ABNORMAL HIGH (ref 7–232)
Troponin I: 5.21 ng/mL (ref <0.30)

## 2012-03-27 LAB — CBC
Hemoglobin: 13.1 g/dL (ref 13.0–17.0)
MCH: 26.6 pg (ref 26.0–34.0)
MCV: 80.7 fL (ref 78.0–100.0)
Platelets: 163 10*3/uL (ref 150–400)
RBC: 4.93 MIL/uL (ref 4.22–5.81)
WBC: 6.4 10*3/uL (ref 4.0–10.5)

## 2012-03-27 LAB — BASIC METABOLIC PANEL
CO2: 25 mEq/L (ref 19–32)
Calcium: 8.9 mg/dL (ref 8.4–10.5)
Chloride: 108 mEq/L (ref 96–112)
Creatinine, Ser: 0.91 mg/dL (ref 0.50–1.35)
Glucose, Bld: 99 mg/dL (ref 70–99)

## 2012-03-27 LAB — GLUCOSE, CAPILLARY
Glucose-Capillary: 94 mg/dL (ref 70–99)
Glucose-Capillary: 127 mg/dL — ABNORMAL HIGH (ref 70–99)

## 2012-03-27 MED ORDER — LISINOPRIL 2.5 MG PO TABS
2.5000 mg | ORAL_TABLET | Freq: Every day | ORAL | Status: DC
  Administered 2012-03-27 – 2012-03-28 (×2): 2.5 mg via ORAL
  Filled 2012-03-27 (×2): qty 1

## 2012-03-27 MED ORDER — ATORVASTATIN CALCIUM 80 MG PO TABS
80.0000 mg | ORAL_TABLET | Freq: Every day | ORAL | Status: DC
  Administered 2012-03-27: 80 mg via ORAL
  Filled 2012-03-27 (×2): qty 1

## 2012-03-27 MED FILL — Dextrose Inj 5%: INTRAVENOUS | Qty: 50 | Status: AC

## 2012-03-27 NOTE — Progress Notes (Signed)
CARDIAC REHAB PHASE I   PRE:  Rate/Rhythm: 71 SR    BP: sitting 121/90    SaO2:   MODE:  Ambulation: 620 ft   POST:  Rate/Rhythm: 87 SR    BP: sitting 128/88     SaO2:   Pt very sleepy today. Sts he can't wake up after taking med last night. No CP/SOB walking. Wobbly at times due to sleepiness. Discussed CRPII in [place] and pt requests referral be sent. Will f/u am with rest of ed so pt will be more alert.  [id number]  Harriet Masson CES, ACSM

## 2012-03-27 NOTE — Progress Notes (Signed)
*  PRELIMINARY RESULTS* Echocardiogram 2D Echocardiogram has been performed.  Daniel Li, Daniel Li 03/27/2012, 2:05 PM

## 2012-03-27 NOTE — Progress Notes (Signed)
Patient being transferred to [id number], report given to [facility]. Discussed reason for admission, intervention and previous medical history. Advised that patient is oriented but lethargic this morning. All questions answered.

## 2012-03-27 NOTE — Progress Notes (Signed)
Daniel Li Internal Medicine Resident Note  Subjective:  Feels very sleepy. Denies any chest pain, SOB, abdominal pain, nausea or vomiting. During rehab assesment yesterday felt lightheadedness upon standing and walking.   Objective:  Vital Signs in the last 24 hours: Filed Vitals:   03/27/12 0200 03/27/12 0300 03/27/12 0400 03/27/12 0500  BP: 128/84 129/76 134/82 115/85  Pulse:   66   Temp:   98.4 F (36.9 C)   TempSrc:   Oral   Resp:   18   Height:      Weight:      SpO2:   93%     Intake/Output from previous day: 05/29 0701 - 05/30 0700 In: 1102.5 [P.O.:690; I.V.:412.5] Out: 2650 [Urine:2650]  24-hour weight change: Weight change:   Weight trends: Filed Weights   03/26/12 0049 03/26/12 0337  Weight: 188 lb (85.276 kg) 181 lb 14.1 oz (82.5 kg)    Physical Exam: Pt is alert and oriented, NAD HEENT: normal Neck: JVP - normal, carotids 2+ equal without bruits Lungs: CTA bilaterally CV: RRR without murmur or gallop Abd: soft, NT, Positive BS,  Ext: no C/C/E, distal pulses intact and equal Skin: warm/dry no rash  Lab Results:  Basename 03/26/12 0430 03/26/12 0057  WBC 8.9 11.1*  HGB 12.9* 14.4  PLT 169 165    Basename 03/26/12 0430 03/26/12 0057  NA 139 140  K 4.0 3.5  CL 105 101  CO2 24 25  GLUCOSE 169* 232*  BUN 11 11  CREATININE 0.90 1.05    Basename 03/26/12 2344 03/26/12 1600  TROPONINI 10.22* 16.54*    Basename 03/26/12 2124 03/26/12 1605 03/26/12 1232 03/26/12 0758 03/26/12 0356  GLUCAP 122* 122* 117* 106* 166*    Basename 03/26/12 0057  PROBNP 25.2   Tele: Episodes of Vtach.   Scheduled Meds:   . aspirin  81 mg Oral Daily  . atorvastatin  40 mg Oral q1800  . gabapentin  100 mg Oral TID  . glipiZIDE  10 mg Oral Q breakfast  . insulin aspart  0-15 Units Subcutaneous TID WC  . metoprolol tartrate  12.5 mg Oral BID  . QUEtiapine Fumarate  150 mg Oral QHS  . Ticagrelor  90 mg Oral BID  . DISCONTD: antiseptic oral rinse  15 mL  Mouth Rinse BID   Continuous Infusions:   . sodium chloride 1 mL/kg/hr (03/26/12 0345)   PRN Meds:.acetaminophen, HYDROcodone-acetaminophen, morphine injection, ondansetron (ZOFRAN) IV  Imaging: Chest Portable 1 View  03/26/2012  *RADIOLOGY REPORT*  Clinical Data: Chest pain  PORTABLE CHEST - 1 VIEW  Comparison: CT chest 04/14/2006  Findings: Shallow inspiration.  Normal heart size and pulmonary vascularity.  No focal airspace consolidation in the lungs.  No blunting of costophrenic angles.  No pneumothorax.  Mildly tortuous aorta.  IMPRESSION: No evidence of active pulmonary disease.  Original Report Authenticated By: Marlon Pel, M.D.   Assessment/Plan:   Pt is a 56 y.o. yo male with a PMHx of CAD s/p PCI in 2007 , DM, Type 2, HLD, Hypertension who was admitted on 03/26/2012 for chest pain and was found to have STEMI. Acute inferior wall myocardial infarction secondary to total occlusion of the right coronary artery, treated successfully with primary PCI using a bare-metal stent   1. STEMI: s/p PCI with bare-metal stent to RCA. Patient has history of multivessel disease s/p PCI in 2007. Troponin max elevation of 16 now trending down. TSH 0.75. Currently on Aspirin, Brilinta, Statin , Metoprolol.  Cardiac rehab phase I in place. Case manager assisting with  Medication.  Plan: Patient can be transferred to tele today and possible d/c tomorrow.  Need to ambulate.   2. Hyperlipidemia : LDL 159. Currently on Statin   3. Hypertension: BP well controlled. Currently on metoprolol 12.5 bid.  Patient lightheaded with ambulation. Will hold of ACEi as of now but should be reevaluated as an outpatient .   4. DM, Type 2: Hgb A1c 6.8  - Continue Glipizide and SSI   5. Tobacco abuse. Counseled. Cessation Consult in place    Length of Stay: 1 days  Patient history and plan of care reviewed with attending, Dr. Shirlee Latch.   Almyra Deforest, M.D. 03/27/2012, 6:25 AM   Patient seen with  resident, agree with note.  1. S/p inferior STEMI: Continue work with rehab, may go to telemetry today.  Continue current meds, add lisinopril 2.5 mg daily.  Will get echo.  2. Rhythm: 2 episodes of what appear to be type II 2nd degree AV block last night.  Hopefully this is a sequelae of the inferior MI that will resolve. Continue to monitor on telemetry.  3.  Need to stop smoking.   Marca Ancona 03/27/2012 8:55 AM

## 2012-03-28 ENCOUNTER — Encounter (HOSPITAL_COMMUNITY): Payer: Self-pay | Admitting: Cardiology

## 2012-03-28 DIAGNOSIS — E7800 Pure hypercholesterolemia, unspecified: Secondary | ICD-10-CM | POA: Insufficient documentation

## 2012-03-28 DIAGNOSIS — Z720 Tobacco use: Secondary | ICD-10-CM | POA: Insufficient documentation

## 2012-03-28 LAB — HEPATIC FUNCTION PANEL
AST: 48 U/L — ABNORMAL HIGH (ref 0–37)
Albumin: 3.2 g/dL — ABNORMAL LOW (ref 3.5–5.2)
Alkaline Phosphatase: 76 U/L (ref 39–117)
Total Bilirubin: 0.4 mg/dL (ref 0.3–1.2)

## 2012-03-28 MED ORDER — ASPIRIN 81 MG PO CHEW: 81.0000 mg | CHEWABLE_TABLET | Freq: Every day | ORAL | Status: DC

## 2012-03-28 MED ORDER — NITROGLYCERIN 0.4 MG SL SUBL: 0.4000 mg | SUBLINGUAL_TABLET | SUBLINGUAL | Status: DC | PRN

## 2012-03-28 MED ORDER — TICAGRELOR 90 MG PO TABS: 90.0000 mg | ORAL_TABLET | Freq: Two times a day (BID) | ORAL | Status: DC

## 2012-03-28 MED ORDER — METOPROLOL TARTRATE 25 MG PO TABS: 12.5000 mg | ORAL_TABLET | Freq: Two times a day (BID) | ORAL | Status: DC

## 2012-03-28 MED ORDER — SIMVASTATIN 40 MG PO TABS: 40.0000 mg | ORAL_TABLET | Freq: Every evening | ORAL | Status: DC

## 2012-03-28 MED ORDER — LISINOPRIL 2.5 MG PO TABS: 2.5000 mg | ORAL_TABLET | Freq: Every day | ORAL | Status: DC

## 2012-03-28 NOTE — Discharge Summary (Signed)
Agree as outlined. Please see my progress note the same date.  [name] 03/28/2012 6:23 PM

## 2012-03-28 NOTE — Discharge Instructions (Signed)
Myocardial Infarction A myocardial infarction (MI) is damage to the heart that is not reversible. It is also called a heart attack. An MI usually occurs when a heart (coronary) artery becomes blocked or narrowed. This cuts off the blood supply to the heart. When one or more of the heart (coronary) arteries becomes blocked, that area of the heart begins to die. This causes pain felt during an MI.  If you think you might be having an MI, call your local emergency services immediately (911 in [place] It is recommended that you take a 162 mg non-enteric coated aspirin if you do not have an aspirin allergy. Do not drive yourself to the hospital or wait to see if your symptoms go away. The sooner MI is treated, the greater the amount of heart muscle saved. Time is muscle. It can save your life. CAUSES  An MI can occur from:  A gradual buildup of a fatty substance called plaque. When plaque builds up in the arteries, this condition is called atherosclerosis. This buildup can block or reduce the blood supply to the heart artery(s).   A sudden plaque rupture within a heart artery that causes a blood clot (thrombus). A blood clot can block the heart artery which does not allow blood flow to the heart.   A severe tightening (spasm) of the heart artery. This is a less common cause of a heart attack. When a heart artery spasms, it cuts off blood flow through the artery. Spasms can occur in heart arteries that do not have atherosclerosis.  RISK FACTORS People at risk for an MI usually have one or more risk factors, such as:  High blood pressure.   High cholesterol.   Smoking.   Gender. Men have a higher heart attack risk.   Overweight/obesity.   Age.   Family history.   Lack of exercise.   Diabetes.   Stress.   Excessive alcohol use.   Street drug use (cocaine and methamphetamines).  SYMPTOMS  MI symptoms can vary, such as:  In both men and women, MI symptoms can include the following:    Chest pain. The chest pain may feel like a crushing, squeezing, or "pressure" type feeling. MI pain can be "referred," meaning pain can be caused in one part of the body but felt in another part of the body. Referred MI pain may occur in the left arm, neck, or jaw. Pain may even be felt in the right arm.   Shortness of breath (dyspnea).   Heartburn or indigestion with or without vomiting, shortness of breath, or sweating (diaphoresis).   Sudden, cold sweats.   Sudden lightheadedness.   Upper back pain.   Women can have unique MI symptoms, such as:   Unexplained feelings of nervousness or anxiety.   Discomfort between the shoulder blades (scapula) or upper back.   Tingling in the hands and arms.   In elderly people (regardless of gender), MI symptoms can be subtle, such as:   Sweating (diaphoresis).   Shortness of breath (dyspnea).   General tiredness (fatigue) or not feeling well (malaise).  DIAGNOSIS  Diagnosis of an MI involves several tests such as:  An assessment of your vital signs such as heart rhythm, blood pressure, respiratory rate, and oxygen level.   An EKG (ECG) to look at the electrical activity of your heart.   Blood tests called cardiac markers are drawn at scheduled times to measure proteins or enzymes released by the damaged heart muscle.   A chest  X-ray.   An echocardiogram to evaluate heart motion and blood flow.   Coronary angiography (cardiac catheterization). This is a diagnostic procedure to look at the heart arteries.  TREATMENT  Acute Intervention. For an MI, the national standard in the [place] is to have an acute intervention in under 90 minutes from the time you get to the hospital. An acute intervention is a special procedure to open up the heart arteries. It is done in a treatment room called a "catheterization lab" (cath lab). Some hospitals do no have a cath lab. If you are having an MI and the hospital does not have a cath lab, the  standard is to transport you to a hospital that has one. In the cath lab, acute intervention includes:  Angioplasty. An angioplasty involves inserting a thin, flexible tube (catheter) into an artery in either your groin or wrist. The catheter is threaded to the heart arteries. A balloon at the end of the catheter is inflated to open a narrowed or blocked heart artery. During an angioplasty procedure, a small mesh tube (stent) may be used to keep the heart artery open. Depending on your condition and health history, one of two types of stents may be placed:   Drug-eluting stent (DES). A DES is coated with a medicine to prevent scar tissue from growing over the stent. With drug-eluting stents, blood thinning medicine will need to be taken for up to a year.   Bare metal stent. This type of stent has no special coating to keep tissue from growing over it. This type of stent is used if you cannot take blood thinning medicine for a prolonged time or you need surgery in the near future. After a bare metal stent is placed, blood thinning medicine will need to be taken for about a month.   If you are taking blood thinning medicine (anti-platelet therapy) after stent placement, do not stop taking it unless your caregiver says it is okay to do so. Make sure you understand how long you need to take the medicine.  Surgical Intervention  If an acute intervention is not successful, surgery may be needed:   Open heart surgery (coronary artery bypass graft, CABG). CABG takes a vein (saphenous vein) from your leg. The vein is then attached to the blocked heart artery which bypasses the blockage. This then allows blood flow to the heart muscle.  Additional Interventions  A "clot buster" medicine (thrombolytic) may be given. This medicine can help break up a clot in the heart artery. This medicine may be given if a person cannot get to a cath lab right away.   Intra-aortic balloon pump (IABP). If you have suffered a  very severe MI and are too unstable to go to the cath lab or to surgery, an IABP may be used. This is a temporary mechanical device used to increase blood flow to the heart and reduce the workload of the heart until you are stable enough to go to the cath lab or surgery.  HOME CARE INSTRUCTIONS After an MI, you may need the following:  Medication. Take medication as directed by your caregiver. Medications after an MI may:   Keep your blood from clotting easily (blood thinners).   Control your blood pressure.   Help lower your cholesterol.   Control abnormal heart rhythms.   Lifestyle changes. Under the guidance of your caregiver, lifestyle changes include:   Quitting smoking, if you smoke. Your caregiver can help you quit.   Being   physically active.   Maintaining a healthy weight.   Eating a heart healthy diet. A dietician can help you learn healthy eating options.   Managing diabetes.   Reducing stress.   Limiting alcohol intake.  SEEK IMMEDIATE MEDICAL CARE IF:   You have severe chest pain, especially if the pain is crushing or pressure-like and spreads to the arms, back, neck, or jaw. This is an emergency. Do not wait to see if the pain will go away. Get medical help at once. Call your local emergency services (911 in the [place] Do not drive yourself to the hospital.   You have shortness of breath during rest, sleep, or with activity.   You have sudden sweating or clammy skin.   You feel sick to your stomach (nauseous) and throw up (vomit).   You suddenly become lightheaded or dizzy.   You feel your heart beating rapidly or you notice "skipped" beats.  MAKE SURE YOU:   Understand these instructions.   Will watch your condition.   Will get help right away if you are not doing well or get worse.  Document Released: 10/15/2005 Document Revised: 10/04/2011 Document Reviewed: 03/14/2011 [facility] Patient Information 2012 [place], [place]. Radial Site Care Refer to this  sheet in the next few weeks. These instructions provide you with information on caring for yourself after your procedure. Your caregiver may also give you more specific instructions. Your treatment has been planned according to current medical practices, but problems sometimes occur. Call your caregiver if you have any problems or questions after your procedure. HOME CARE INSTRUCTIONS  You may shower the day after the procedure.Remove the bandage (dressing) and gently wash the site with plain soap and water.Gently pat the site dry.   Do not apply powder or lotion to the site.   Do not submerge the affected site in water for 3 to 5 days.   Inspect the site at least twice daily.   Do not flex or bend the affected arm for 24 hours.   No lifting over 5 pounds (2.3 kg) for 5 days after your procedure.   Do not drive home if you are discharged the same day of the procedure. Have someone else drive you.   You may drive 24 hours after the procedure unless otherwise instructed by your caregiver.   Do not operate machinery or power tools for 24 hours.   A responsible adult should be with you for the first 24 hours after you arrive home.  What to expect:  Any bruising will usually fade within 1 to 2 weeks.   Blood that collects in the tissue (hematoma) may be painful to the touch. It should usually decrease in size and tenderness within 1 to 2 weeks.  SEEK IMMEDIATE MEDICAL CARE IF:  You have unusual pain at the radial site.   You have redness, warmth, swelling, or pain at the radial site.   You have drainage (other than a small amount of blood on the dressing).   You have chills.   You have a fever or persistent symptoms for more than 72 hours.   You have a fever and your symptoms suddenly get worse.   Your arm becomes pale, cool, tingly, or numb.   You have heavy bleeding from the site. Hold pressure on the site.  Document Released: 11/17/2010 Document Revised: 10/04/2011  Document Reviewed: 11/17/2010 [facility] Patient Information 2012 [place], [place].

## 2012-03-28 NOTE — Progress Notes (Signed)
Ed completed with pt. Very receptive. Sts he is done smoking. [id number] Ethelda Chick CES, ACSM
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
## 2012-03-28 NOTE — Progress Notes (Signed)
    Subjective:  No chest pain or dyspnea. No complaints this am.   Objective:  Vital Signs in the last 24 hours: Temp:  [97.9 F (36.6 C)-99.1 F (37.3 C)] 97.9 F (36.6 C) (05/31 0500) Pulse Rate:  [68-78] 68  (05/31 0500) Resp:  [12-18] 18  (05/31 0500) BP: (108-141)/(73-96) 108/73 mmHg (05/31 0500) SpO2:  [97 %-99 %] 97 % (05/31 0500)  Intake/Output from previous day: 05/30 0701 - 05/31 0700 In: -  Out: 500 [Urine:500]  Physical Exam: Pt is alert and oriented, NAD HEENT: normal Neck: JVP - normal, carotids 2+= without bruits Lungs: CTA bilaterally CV: RRR without murmur or gallop Abd: soft, NT, Positive BS, no hepatomegaly Ext: no C/C/E, distal pulses intact and equal. Right radial site ok. Skin: warm/dry no rash  Lab Results:  Basename 03/27/12 0613 03/26/12 0430  WBC 6.4 8.9  HGB 13.1 12.9*  PLT 163 169    Basename 03/27/12 0613 03/26/12 0430  NA 144 139  K 3.7 4.0  CL 108 105  CO2 25 24  GLUCOSE 99 169*  BUN 8 11  CREATININE 0.91 0.90    Basename 03/27/12 1540 03/27/12 0925  TROPONINI 5.21* 6.36*    Cardiac Studies: 2D Echo pending  Tele: sinus rhythm  Assessment/Plan:  1. Inferior MI - stable after primary PCI with a bare-metal stent to the RCA. 2D Echo pending - ok for discharge home after the echo is interpreted. ASA/Brilinta 12 months if able to get the medicine. Needs 30 day card for brilinta. I stressed the importance of med adherence and tobacco cessation. Follow-up in [place] office.  2. Hyperlipidemia - change statin to zocor for cost  3. HTN - controlled on lisinopril and metoprolol  4. Dispo - home today.   [name], M.D. 03/28/2012, 8:26 AM

## 2012-03-28 NOTE — Progress Notes (Signed)
Pt discharged to home per MD order.  Pt received all discharge instructions and medication information, including follow appointments  And prescriptions and card for 30-day Brilinta supply.  Pt alert and oriented at discharge with no complaints of pain.  Pt escorted to exit via guest services.  Daniel Li

## 2012-04-04 ENCOUNTER — Ambulatory Visit (INDEPENDENT_AMBULATORY_CARE_PROVIDER_SITE_OTHER): Payer: Medicaid Other | Admitting: Adult Health

## 2012-04-04 ENCOUNTER — Encounter: Payer: Self-pay | Admitting: Adult Health

## 2012-04-04 VITALS — BP 139/95 | HR 74 | Ht 66.0 in | Wt 186.0 lb

## 2012-04-04 DIAGNOSIS — I10 Essential (primary) hypertension: Secondary | ICD-10-CM

## 2012-04-04 DIAGNOSIS — I2510 Atherosclerotic heart disease of native coronary artery without angina pectoris: Secondary | ICD-10-CM

## 2012-04-04 DIAGNOSIS — E7800 Pure hypercholesterolemia, unspecified: Secondary | ICD-10-CM

## 2012-04-04 MED ORDER — GLIPIZIDE ER 10 MG PO TB24: 10.0000 mg | ORAL_TABLET | Freq: Every day | ORAL | Status: DC

## 2012-04-04 NOTE — Assessment & Plan Note (Signed)
Continues on simvastatin. Doubt muscle pain is related to use of statin at this time. Will follow. He is to continue naproxen for pain control.
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
## 2012-04-04 NOTE — Assessment & Plan Note (Signed)
He had totally occluded RCA requiring BMS intervention. He is now on DAPT with Brilinta and ASA. He has no complaints of bleeding or significant bruising. He is medically complaint and has stopped smoking. He is due to see cardiac rehab for first appointment on June 20th,. He will be seen in the office on follow up in 3 months. I have advised that he follow up as requested for continuing assessment and treatment of CAD.

## 2012-04-04 NOTE — Patient Instructions (Signed)
**Note De-Identified  Obfuscation** We have refilled Glipizide for you at this visit, in the future please have you Primary care doctor refill your diabetic medications  Your physician recommends that you continue on your current medications as directed. Please refer to the Current Medication list given to you today.  Your physician recommends that you schedule a follow-up appointment in: 3 months

## 2012-04-04 NOTE — Assessment & Plan Note (Signed)
Currently well controlled on lisinopril 2.5 mg. Creatinine on discharge 0.91 with potassium of 3.7.

## 2012-04-17 ENCOUNTER — Other Ambulatory Visit: Payer: Self-pay | Admitting: *Deleted

## 2012-04-17 ENCOUNTER — Encounter (HOSPITAL_COMMUNITY): Payer: Self-pay

## 2012-04-17 ENCOUNTER — Encounter (HOSPITAL_COMMUNITY)
Admission: RE | Admit: 2012-04-17 | Discharge: 2012-04-17 | Disposition: A | Discharge status: Home / Self Care | Payer: Medicaid Other | Source: Ambulatory Visit | Attending: Cardiovascular Disease | Admitting: Cardiovascular Disease

## 2012-04-17 NOTE — Patient Instructions (Signed)
Pt has finished orientation and is scheduled to start CR on 04/21/12 at 11 am. Pt has been instructed to arrive to class 15 minutes early for scheduled class. Pt has been instructed to wear comfortable clothing and shoes with rubber soles. Pt has been told to take their medications 1 hour prior to coming to class.  If the patient is not going to attend class, he/she has been instructed to call.

## 2012-04-17 NOTE — Telephone Encounter (Signed)
Per Hart Rochester in Cardiac Rehab, patient's blood pressure was high upon initial assessment for Rehab.  Discussed with Joni Reining and will advise patient to take and extra 2.5 mg of Lisinopril today and total of 5 mg daily thereafter.  Blood pressure check scheduled for Monday am prior to first Rehab appointment.
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
## 2012-04-21 ENCOUNTER — Encounter (HOSPITAL_COMMUNITY): Payer: Medicaid Other

## 2012-04-21 ENCOUNTER — Ambulatory Visit (INDEPENDENT_AMBULATORY_CARE_PROVIDER_SITE_OTHER): Payer: Medicaid Other

## 2012-04-21 DIAGNOSIS — I10 Essential (primary) hypertension: Secondary | ICD-10-CM

## 2012-04-21 MED ORDER — HYDROCHLOROTHIAZIDE 12.5 MG PO CAPS: 12.5000 mg | ORAL_CAPSULE | Freq: Every day | ORAL | Status: DC

## 2012-04-21 NOTE — Progress Notes (Signed)
**Note De-Identified  Obfuscation** Pt's BP in left arm was 147/103 at 9 am. Pt. stated that he had just taken his BP meds at 8:30 this morning. His BP was rechecked at 9:50 am in left arm and was 152/107. Per Joni Reining, NP pt. Is advised to start taking HCTZ 12.5 mg daily and to have BMET drawn on 6-27, he verbalized understanding. RX sent to [facility] (HCTZ is $4.00 for 30 tablets) and lab faxed to [facility] lab. /LV

## 2012-04-23 ENCOUNTER — Encounter (HOSPITAL_COMMUNITY): Payer: Medicaid Other

## 2012-04-23 ENCOUNTER — Other Ambulatory Visit: Payer: Self-pay

## 2012-04-23 DIAGNOSIS — I10 Essential (primary) hypertension: Secondary | ICD-10-CM

## 2012-04-23 MED ORDER — HYDROCHLOROTHIAZIDE 12.5 MG PO CAPS: 12.5000 mg | ORAL_CAPSULE | Freq: Every day | ORAL | Status: DC

## 2012-04-23 MED ORDER — LISINOPRIL 2.5 MG PO TABS: 2.5000 mg | ORAL_TABLET | Freq: Every day | ORAL | Status: DC

## 2012-04-23 MED ORDER — NITROGLYCERIN 0.4 MG SL SUBL: 0.4000 mg | SUBLINGUAL_TABLET | SUBLINGUAL | Status: DC | PRN

## 2012-04-23 MED ORDER — METOPROLOL TARTRATE 25 MG PO TABS: 12.5000 mg | ORAL_TABLET | Freq: Two times a day (BID) | ORAL | Status: DC

## 2012-04-23 MED ORDER — TICAGRELOR 90 MG PO TABS: 90.0000 mg | ORAL_TABLET | Freq: Two times a day (BID) | ORAL | Status: DC

## 2012-04-23 MED ORDER — SIMVASTATIN 40 MG PO TABS: 40.0000 mg | ORAL_TABLET | Freq: Every evening | ORAL | Status: DC

## 2012-04-24 LAB — BASIC METABOLIC PANEL
BUN: 14 mg/dL (ref 6–23)
Calcium: 9.6 mg/dL (ref 8.4–10.5)
Glucose, Bld: 135 mg/dL — ABNORMAL HIGH (ref 70–99)

## 2012-04-25 ENCOUNTER — Encounter (HOSPITAL_COMMUNITY): Payer: Medicaid Other

## 2012-04-28 ENCOUNTER — Telehealth: Payer: Self-pay | Admitting: Adult Health

## 2012-04-28 ENCOUNTER — Encounter (HOSPITAL_COMMUNITY): Payer: Medicaid Other

## 2012-04-28 NOTE — Telephone Encounter (Signed)
**Note De-identified  Obfuscation** Pt. advised, he verbalized understanding./LV 

## 2012-04-28 NOTE — Telephone Encounter (Signed)
Lab results / tg  °

## 2012-04-30 ENCOUNTER — Encounter (HOSPITAL_COMMUNITY)
Admission: RE | Admit: 2012-04-30 | Discharge: 2012-04-30 | Disposition: A | Discharge status: Home / Self Care | Payer: Medicaid Other | Source: Ambulatory Visit | Attending: Cardiology | Admitting: Cardiology

## 2012-04-30 DIAGNOSIS — I2510 Atherosclerotic heart disease of native coronary artery without angina pectoris: Secondary | ICD-10-CM | POA: Insufficient documentation

## 2012-04-30 DIAGNOSIS — Z5189 Encounter for other specified aftercare: Secondary | ICD-10-CM | POA: Insufficient documentation

## 2012-04-30 DIAGNOSIS — I252 Old myocardial infarction: Secondary | ICD-10-CM | POA: Insufficient documentation

## 2012-05-02 ENCOUNTER — Encounter: Payer: Self-pay | Admitting: Cardiovascular Disease

## 2012-05-02 ENCOUNTER — Encounter (HOSPITAL_COMMUNITY)
Admission: RE | Admit: 2012-05-02 | Discharge: 2012-05-02 | Disposition: A | Discharge status: Home / Self Care | Payer: Medicaid Other | Source: Ambulatory Visit | Attending: Cardiology | Admitting: Cardiology

## 2012-05-05 ENCOUNTER — Encounter (HOSPITAL_COMMUNITY)
Admission: RE | Admit: 2012-05-05 | Discharge: 2012-05-05 | Disposition: A | Discharge status: Home / Self Care | Payer: Medicaid Other | Source: Ambulatory Visit | Attending: Cardiology | Admitting: Cardiology

## 2012-05-06 ENCOUNTER — Encounter: Payer: Self-pay | Admitting: Cardiovascular Disease

## 2012-05-06 ENCOUNTER — Ambulatory Visit (INDEPENDENT_AMBULATORY_CARE_PROVIDER_SITE_OTHER): Payer: Medicaid Other | Admitting: Cardiovascular Disease

## 2012-05-06 VITALS — BP 145/95 | Ht 66.0 in | Wt 184.4 lb

## 2012-05-06 DIAGNOSIS — I2510 Atherosclerotic heart disease of native coronary artery without angina pectoris: Secondary | ICD-10-CM

## 2012-05-06 DIAGNOSIS — I10 Essential (primary) hypertension: Secondary | ICD-10-CM

## 2012-05-06 NOTE — Assessment & Plan Note (Signed)
Stable with no angina and good activity level.  Continue medical Rx Samples of Brillinta given.  F/U PA in 6 months

## 2012-05-06 NOTE — Assessment & Plan Note (Signed)
Well controlled.  Continue current medications and low sodium Dash type diet.    
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
## 2012-05-06 NOTE — Assessment & Plan Note (Signed)
Cholesterol is at goal.  Continue current dose of statin and diet Rx.  No myalgias or side effects.  F/U  LFT's in 6 months. Lab Results  Component Value Date   LDLCALC 159* 03/26/2012

## 2012-05-06 NOTE — Patient Instructions (Signed)
Your physician recommends that you continue on your current medications as directed. Please refer to the Current Medication list given to you today.  Your physician recommends that you schedule a follow-up appointment in: 6 months  

## 2012-05-07 ENCOUNTER — Encounter (HOSPITAL_COMMUNITY)
Admission: RE | Admit: 2012-05-07 | Discharge: 2012-05-07 | Disposition: A | Discharge status: Home / Self Care | Payer: Medicaid Other | Source: Ambulatory Visit | Attending: Cardiology | Admitting: Cardiology

## 2012-05-09 ENCOUNTER — Encounter (HOSPITAL_COMMUNITY): Payer: Medicaid Other

## 2012-05-12 ENCOUNTER — Encounter (HOSPITAL_COMMUNITY): Payer: Medicaid Other

## 2012-05-14 ENCOUNTER — Encounter (HOSPITAL_COMMUNITY)
Admission: RE | Admit: 2012-05-14 | Discharge: 2012-05-14 | Disposition: A | Discharge status: Home / Self Care | Payer: Medicaid Other | Source: Ambulatory Visit | Attending: Cardiology | Admitting: Cardiology

## 2012-05-16 ENCOUNTER — Encounter (HOSPITAL_COMMUNITY)
Admission: RE | Admit: 2012-05-16 | Discharge: 2012-05-16 | Disposition: A | Discharge status: Home / Self Care | Payer: Medicaid Other | Source: Ambulatory Visit | Attending: Cardiology | Admitting: Cardiology

## 2012-05-19 ENCOUNTER — Encounter (HOSPITAL_COMMUNITY): Payer: Medicaid Other

## 2012-05-21 ENCOUNTER — Encounter (HOSPITAL_COMMUNITY)
Admission: RE | Admit: 2012-05-21 | Discharge: 2012-05-21 | Disposition: A | Discharge status: Home / Self Care | Payer: Medicaid Other | Source: Ambulatory Visit | Attending: Cardiology | Admitting: Cardiology

## 2012-05-23 ENCOUNTER — Encounter (HOSPITAL_COMMUNITY)
Admission: RE | Admit: 2012-05-23 | Discharge: 2012-05-23 | Disposition: A | Discharge status: Home / Self Care | Payer: Medicaid Other | Source: Ambulatory Visit | Attending: Cardiology | Admitting: Cardiology

## 2012-05-26 ENCOUNTER — Encounter (HOSPITAL_COMMUNITY)
Admission: RE | Admit: 2012-05-26 | Discharge: 2012-05-26 | Disposition: A | Discharge status: Home / Self Care | Payer: Medicaid Other | Source: Ambulatory Visit | Attending: Cardiology | Admitting: Cardiology

## 2012-05-28 ENCOUNTER — Encounter (HOSPITAL_COMMUNITY)
Admission: RE | Admit: 2012-05-28 | Discharge: 2012-05-28 | Disposition: A | Discharge status: Home / Self Care | Payer: Medicaid Other | Source: Ambulatory Visit | Attending: Cardiology | Admitting: Cardiology

## 2012-05-30 ENCOUNTER — Encounter (HOSPITAL_COMMUNITY)
Admission: RE | Admit: 2012-05-30 | Discharge: 2012-05-30 | Disposition: A | Discharge status: Home / Self Care | Payer: Medicaid Other | Source: Ambulatory Visit | Attending: Cardiology | Admitting: Cardiology

## 2012-05-30 DIAGNOSIS — I2510 Atherosclerotic heart disease of native coronary artery without angina pectoris: Secondary | ICD-10-CM | POA: Insufficient documentation

## 2012-05-30 DIAGNOSIS — I252 Old myocardial infarction: Secondary | ICD-10-CM | POA: Insufficient documentation

## 2012-05-30 DIAGNOSIS — Z5189 Encounter for other specified aftercare: Secondary | ICD-10-CM | POA: Insufficient documentation

## 2012-06-02 ENCOUNTER — Encounter (HOSPITAL_COMMUNITY)
Admission: RE | Admit: 2012-06-02 | Discharge: 2012-06-02 | Disposition: A | Discharge status: Home / Self Care | Payer: Medicaid Other | Source: Ambulatory Visit | Attending: Cardiology | Admitting: Cardiology

## 2012-06-04 ENCOUNTER — Encounter (HOSPITAL_COMMUNITY)
Admission: RE | Admit: 2012-06-04 | Discharge: 2012-06-04 | Disposition: A | Discharge status: Home / Self Care | Payer: Medicaid Other | Source: Ambulatory Visit | Attending: Cardiology | Admitting: Cardiology

## 2012-06-06 ENCOUNTER — Encounter (HOSPITAL_COMMUNITY)
Admission: RE | Admit: 2012-06-06 | Discharge: 2012-06-06 | Disposition: A | Discharge status: Home / Self Care | Payer: Medicaid Other | Source: Ambulatory Visit | Attending: Cardiology | Admitting: Cardiology

## 2012-06-09 ENCOUNTER — Encounter (HOSPITAL_COMMUNITY)
Admission: RE | Admit: 2012-06-09 | Discharge: 2012-06-09 | Disposition: A | Discharge status: Home / Self Care | Payer: Medicaid Other | Source: Ambulatory Visit | Attending: Cardiology | Admitting: Cardiology

## 2012-06-10 ENCOUNTER — Emergency Department (HOSPITAL_COMMUNITY): Payer: Medicaid Other

## 2012-06-10 ENCOUNTER — Encounter (HOSPITAL_COMMUNITY): Payer: Self-pay

## 2012-06-10 ENCOUNTER — Emergency Department (HOSPITAL_COMMUNITY)
Admission: EM | Admit: 2012-06-10 | Discharge: 2012-06-10 | Disposition: A | Discharge status: Home / Self Care | Payer: Medicaid Other | Attending: Emergency Medicine | Admitting: Emergency Medicine

## 2012-06-10 DIAGNOSIS — Z7982 Long term (current) use of aspirin: Secondary | ICD-10-CM | POA: Insufficient documentation

## 2012-06-10 DIAGNOSIS — Z8673 Personal history of transient ischemic attack (TIA), and cerebral infarction without residual deficits: Secondary | ICD-10-CM | POA: Insufficient documentation

## 2012-06-10 DIAGNOSIS — Z79899 Other long term (current) drug therapy: Secondary | ICD-10-CM | POA: Insufficient documentation

## 2012-06-10 DIAGNOSIS — E119 Type 2 diabetes mellitus without complications: Secondary | ICD-10-CM | POA: Insufficient documentation

## 2012-06-10 DIAGNOSIS — M79609 Pain in unspecified limb: Secondary | ICD-10-CM | POA: Insufficient documentation

## 2012-06-10 DIAGNOSIS — R209 Unspecified disturbances of skin sensation: Secondary | ICD-10-CM | POA: Insufficient documentation

## 2012-06-10 DIAGNOSIS — I2510 Atherosclerotic heart disease of native coronary artery without angina pectoris: Secondary | ICD-10-CM | POA: Insufficient documentation

## 2012-06-10 DIAGNOSIS — G8929 Other chronic pain: Secondary | ICD-10-CM

## 2012-06-10 DIAGNOSIS — E7800 Pure hypercholesterolemia, unspecified: Secondary | ICD-10-CM | POA: Insufficient documentation

## 2012-06-10 DIAGNOSIS — I10 Essential (primary) hypertension: Secondary | ICD-10-CM | POA: Insufficient documentation

## 2012-06-10 HISTORY — DX: Gastro-esophageal reflux disease without esophagitis: K21.9

## 2012-06-10 HISTORY — DX: Sciatica, unspecified side: M54.30

## 2012-06-10 HISTORY — DX: Unspecified osteoarthritis, unspecified site: M19.90

## 2012-06-10 HISTORY — DX: Dorsalgia, unspecified: M54.9

## 2012-06-10 HISTORY — DX: Other chronic pain: G89.29

## 2012-06-10 LAB — TROPONIN I: Troponin I: <0.3 ng/mL (ref <0.30)

## 2012-06-10 LAB — CBC
MCH: 27.5 pg (ref 26.0–34.0)
Platelets: 208 10*3/uL (ref 150–400)
RBC: 4.83 MIL/uL (ref 4.22–5.81)
WBC: 7.1 10*3/uL (ref 4.0–10.5)

## 2012-06-10 LAB — BASIC METABOLIC PANEL
Calcium: 9.9 mg/dL (ref 8.4–10.5)
GFR calc Af Amer: >90 mL/min (ref >90)
GFR calc non Af Amer: >90 mL/min (ref >90)
Sodium: 141 mEq/L (ref 135–145)

## 2012-06-10 MED ORDER — HYDROCODONE-ACETAMINOPHEN 5-325 MG PO TABS
2.0000 | ORAL_TABLET | Freq: Once | ORAL | Status: AC
  Administered 2012-06-10: 2 via ORAL
  Filled 2012-06-10: qty 2

## 2012-06-10 MED ORDER — OXYCODONE-ACETAMINOPHEN 5-325 MG PO TABS: ORAL_TABLET | ORAL | Status: AC
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
## 2012-06-10 NOTE — ED Notes (Signed)
Pt reports that he has been having bil handand arm numbness. For 1 year, pain is worse today and he has been to see his pmd for same and told it was from his high blood pressure.

## 2012-06-11 ENCOUNTER — Encounter (HOSPITAL_COMMUNITY)
Admission: RE | Admit: 2012-06-11 | Discharge: 2012-06-11 | Disposition: A | Discharge status: Home / Self Care | Payer: Medicaid Other | Source: Ambulatory Visit | Attending: Cardiology | Admitting: Cardiology

## 2012-06-13 ENCOUNTER — Encounter (HOSPITAL_COMMUNITY)
Admission: RE | Admit: 2012-06-13 | Discharge: 2012-06-13 | Disposition: A | Discharge status: Home / Self Care | Payer: Medicaid Other | Source: Ambulatory Visit | Attending: Cardiology | Admitting: Cardiology

## 2012-06-16 ENCOUNTER — Encounter (HOSPITAL_COMMUNITY)
Admission: RE | Admit: 2012-06-16 | Discharge: 2012-06-16 | Disposition: A | Discharge status: Home / Self Care | Payer: Medicaid Other | Source: Ambulatory Visit | Attending: Cardiology | Admitting: Cardiology

## 2012-06-18 ENCOUNTER — Encounter (HOSPITAL_COMMUNITY)
Admission: RE | Admit: 2012-06-18 | Discharge: 2012-06-18 | Disposition: A | Discharge status: Home / Self Care | Payer: Medicaid Other | Source: Ambulatory Visit | Attending: Cardiology | Admitting: Cardiology

## 2012-06-20 ENCOUNTER — Encounter (HOSPITAL_COMMUNITY)
Admission: RE | Admit: 2012-06-20 | Discharge: 2012-06-20 | Disposition: A | Discharge status: Home / Self Care | Payer: Medicaid Other | Source: Ambulatory Visit | Attending: Cardiology | Admitting: Cardiology

## 2012-06-23 ENCOUNTER — Encounter (HOSPITAL_COMMUNITY)
Admission: RE | Admit: 2012-06-23 | Discharge: 2012-06-23 | Disposition: A | Discharge status: Home / Self Care | Payer: Medicaid Other | Source: Ambulatory Visit | Attending: Cardiology | Admitting: Cardiology

## 2012-06-25 ENCOUNTER — Encounter (HOSPITAL_COMMUNITY)
Admission: RE | Admit: 2012-06-25 | Discharge: 2012-06-25 | Disposition: A | Discharge status: Home / Self Care | Payer: Medicaid Other | Source: Ambulatory Visit | Attending: Cardiology | Admitting: Cardiology

## 2012-06-27 ENCOUNTER — Encounter (HOSPITAL_COMMUNITY)
Admission: RE | Admit: 2012-06-27 | Discharge: 2012-06-27 | Disposition: A | Discharge status: Home / Self Care | Payer: Medicaid Other | Source: Ambulatory Visit | Attending: Cardiology | Admitting: Cardiology

## 2012-06-30 ENCOUNTER — Encounter (HOSPITAL_COMMUNITY): Payer: Medicaid Other

## 2012-07-02 ENCOUNTER — Encounter (HOSPITAL_COMMUNITY)
Admission: RE | Admit: 2012-07-02 | Discharge: 2012-07-02 | Disposition: A | Discharge status: Home / Self Care | Payer: Medicaid Other | Source: Ambulatory Visit | Attending: Cardiology | Admitting: Cardiology

## 2012-07-02 DIAGNOSIS — I252 Old myocardial infarction: Secondary | ICD-10-CM | POA: Insufficient documentation

## 2012-07-02 DIAGNOSIS — Z5189 Encounter for other specified aftercare: Secondary | ICD-10-CM | POA: Insufficient documentation

## 2012-07-02 DIAGNOSIS — I2510 Atherosclerotic heart disease of native coronary artery without angina pectoris: Secondary | ICD-10-CM | POA: Insufficient documentation

## 2012-07-04 ENCOUNTER — Encounter (HOSPITAL_COMMUNITY)
Admission: RE | Admit: 2012-07-04 | Discharge: 2012-07-04 | Disposition: A | Discharge status: Home / Self Care | Payer: Medicaid Other | Source: Ambulatory Visit | Attending: Cardiology | Admitting: Cardiology

## 2012-07-07 ENCOUNTER — Encounter (HOSPITAL_COMMUNITY)
Admission: RE | Admit: 2012-07-07 | Discharge: 2012-07-07 | Disposition: A | Discharge status: Home / Self Care | Payer: Medicaid Other | Source: Ambulatory Visit | Attending: Cardiology | Admitting: Cardiology

## 2012-07-08 ENCOUNTER — Ambulatory Visit (INDEPENDENT_AMBULATORY_CARE_PROVIDER_SITE_OTHER): Payer: Medicaid Other | Admitting: Adult Health

## 2012-07-08 ENCOUNTER — Encounter: Payer: Self-pay | Admitting: Adult Health

## 2012-07-08 VITALS — BP 170/110 | HR 82 | Ht 66.0 in | Wt 184.8 lb

## 2012-07-08 DIAGNOSIS — E119 Type 2 diabetes mellitus without complications: Secondary | ICD-10-CM

## 2012-07-08 DIAGNOSIS — I10 Essential (primary) hypertension: Secondary | ICD-10-CM

## 2012-07-08 DIAGNOSIS — E7800 Pure hypercholesterolemia, unspecified: Secondary | ICD-10-CM

## 2012-07-08 DIAGNOSIS — I2510 Atherosclerotic heart disease of native coronary artery without angina pectoris: Secondary | ICD-10-CM

## 2012-07-08 LAB — CBC WITH DIFFERENTIAL/PLATELET
Basophils Absolute: 0 10*3/uL (ref 0.0–0.1)
HCT: 43.8 % (ref 39.0–52.0)
Lymphocytes Relative: 33 % (ref 12–46)
Monocytes Absolute: 0.4 10*3/uL (ref 0.1–1.0)
Neutro Abs: 3.8 10*3/uL (ref 1.7–7.7)
RDW: 15.6 % — ABNORMAL HIGH (ref 11.5–15.5)
WBC: 6.3 10*3/uL (ref 4.0–10.5)

## 2012-07-08 LAB — COMPREHENSIVE METABOLIC PANEL
ALT: 22 U/L (ref 0–53)
AST: 30 U/L (ref 0–37)
Alkaline Phosphatase: 62 U/L (ref 39–117)
CO2: 28 mEq/L (ref 19–32)
Sodium: 141 mEq/L (ref 135–145)
Total Bilirubin: 0.6 mg/dL (ref 0.3–1.2)
Total Protein: 6.8 g/dL (ref 6.0–8.3)

## 2012-07-08 LAB — HEMOGLOBIN A1C: Mean Plasma Glucose: 140 mg/dL — ABNORMAL HIGH (ref <117)

## 2012-07-08 MED ORDER — METOPROLOL TARTRATE 25 MG PO TABS: 12.5000 mg | ORAL_TABLET | Freq: Two times a day (BID) | ORAL | Status: DC

## 2012-07-08 MED ORDER — TICAGRELOR 90 MG PO TABS: 90.0000 mg | ORAL_TABLET | Freq: Two times a day (BID) | ORAL | Status: DC

## 2012-07-08 MED ORDER — LISINOPRIL 2.5 MG PO TABS: 2.5000 mg | ORAL_TABLET | Freq: Every day | ORAL | Status: DC

## 2012-07-08 MED ORDER — SIMVASTATIN 40 MG PO TABS: 40.0000 mg | ORAL_TABLET | Freq: Every evening | ORAL | Status: DC

## 2012-07-08 NOTE — Assessment & Plan Note (Signed)
Blood pressure is not only control. This is due in part because he has not taken his medications today. I advised him to take his medications every day on time with he is to continue a low sodium diet and quit smoking. I have advised him to bring his bottles with him on the next visit so that we can evaluate his medications better than by verbal list he provides. I will check some labs today to include a CBC and chemistries along with a hemoglobin A1c which will be also sent to his primary care physician. We will see him again in 6 months unless becomes symptomatic.

## 2012-07-08 NOTE — Patient Instructions (Addendum)
LABS TODAY:  CBC, CMET, HGBA1C  Your physician wants you to follow-up in: 6 months with Joni Reining, NP.  You will receive a reminder letter in the mail two months in advance. If you don't receive a letter, please call our office to schedule the follow-up appointment.
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
## 2012-07-08 NOTE — Assessment & Plan Note (Signed)
He has no further complaints of recurrent chest discomfort or shortness of breath. I have provided him samples of Brilinta. He is advised a low-sodium low-cholesterol diet. He will need to have followup cholesterol studies iwhen he is fasting ongoing assessment and risk reduction.

## 2012-07-08 NOTE — Assessment & Plan Note (Signed)
He has had cholesterol evaluated 3 months ago and remains on simvastatin 40 mg daily. He will he followup studies in the next 6 months.

## 2012-07-09 ENCOUNTER — Encounter (HOSPITAL_COMMUNITY)
Admission: RE | Admit: 2012-07-09 | Discharge: 2012-07-09 | Disposition: A | Discharge status: Home / Self Care | Payer: Medicaid Other | Source: Ambulatory Visit | Attending: Cardiology | Admitting: Cardiology

## 2012-07-11 ENCOUNTER — Encounter (HOSPITAL_COMMUNITY)
Admission: RE | Admit: 2012-07-11 | Discharge: 2012-07-11 | Disposition: A | Discharge status: Home / Self Care | Payer: Medicaid Other | Source: Ambulatory Visit | Attending: Cardiology | Admitting: Cardiology

## 2012-07-14 ENCOUNTER — Encounter (HOSPITAL_COMMUNITY)
Admission: RE | Admit: 2012-07-14 | Discharge: 2012-07-14 | Disposition: A | Discharge status: Home / Self Care | Payer: Medicaid Other | Source: Ambulatory Visit | Attending: Cardiology | Admitting: Cardiology

## 2012-07-16 ENCOUNTER — Encounter (HOSPITAL_COMMUNITY)
Admission: RE | Admit: 2012-07-16 | Discharge: 2012-07-16 | Disposition: A | Discharge status: Home / Self Care | Payer: Medicaid Other | Source: Ambulatory Visit | Attending: Cardiology | Admitting: Cardiology

## 2012-07-18 ENCOUNTER — Encounter (HOSPITAL_COMMUNITY)
Admission: RE | Admit: 2012-07-18 | Discharge: 2012-07-18 | Disposition: A | Discharge status: Home / Self Care | Payer: Medicaid Other | Source: Ambulatory Visit | Attending: Cardiology | Admitting: Cardiology

## 2012-07-21 ENCOUNTER — Encounter (HOSPITAL_COMMUNITY): Payer: Medicaid Other

## 2012-07-23 ENCOUNTER — Encounter (HOSPITAL_COMMUNITY)
Admission: RE | Admit: 2012-07-23 | Discharge: 2012-07-23 | Disposition: A | Discharge status: Home / Self Care | Payer: Medicaid Other | Source: Ambulatory Visit | Attending: Cardiovascular Disease | Admitting: Cardiovascular Disease

## 2012-07-25 ENCOUNTER — Encounter (HOSPITAL_COMMUNITY)
Admission: RE | Admit: 2012-07-25 | Discharge: 2012-07-25 | Disposition: A | Discharge status: Home / Self Care | Payer: Medicaid Other | Source: Ambulatory Visit | Attending: Cardiology | Admitting: Cardiology

## 2012-07-28 ENCOUNTER — Encounter (HOSPITAL_COMMUNITY)
Admission: RE | Admit: 2012-07-28 | Discharge: 2012-07-28 | Disposition: A | Discharge status: Home / Self Care | Payer: Medicaid Other | Source: Ambulatory Visit | Attending: Cardiology | Admitting: Cardiology

## 2012-07-30 ENCOUNTER — Encounter (HOSPITAL_COMMUNITY)
Admission: RE | Admit: 2012-07-30 | Discharge: 2012-07-30 | Disposition: A | Discharge status: Home / Self Care | Payer: Medicaid Other | Source: Ambulatory Visit | Attending: Cardiology | Admitting: Cardiology

## 2012-07-30 DIAGNOSIS — I252 Old myocardial infarction: Secondary | ICD-10-CM | POA: Insufficient documentation

## 2012-07-30 DIAGNOSIS — Z5189 Encounter for other specified aftercare: Secondary | ICD-10-CM | POA: Insufficient documentation

## 2012-08-01 ENCOUNTER — Encounter (HOSPITAL_COMMUNITY): Payer: Medicaid Other

## 2012-08-04 ENCOUNTER — Encounter (HOSPITAL_COMMUNITY): Payer: Medicaid Other

## 2012-08-06 ENCOUNTER — Encounter (HOSPITAL_COMMUNITY): Payer: Medicaid Other

## 2012-08-12 NOTE — Progress Notes (Signed)
Patient graduated from Cardiac Rehabilitation today on 07/30/12 after completing 36 sessions. He achieved LTG of 30 minutes of aerobic exercise at Max Met level of 4.0. All patients vitals are WNL. Patient did not meet with dietician due to no transportation on day of class. Discharge instruction has been reviewed in detail and patient stated an understanding of material given. Patient plans to exercise at home on by walking in his neighborhood. Cardiac Rehab staff will make f/u calls at 1 month, 6 months, and 1 year. Patient had no complaints of any abnormal S/S or pain on their exit visit.

## 2012-08-12 NOTE — Patient Instructions (Signed)
Pt has graduated from program on 07/30/12. Home exercise packet given and explained. Patient told to call at anytime if there are every any questions. Staff will be doing follow-up calls at 1 month, 6 months and 1 year.

## 2012-08-13 NOTE — Progress Notes (Signed)
Cardiac Rehabilitation Program Outcomes Report   Orientation:  04/17/2012 Graduate Date:  07/30/2012 Discharge Date:  07/30/2012 # of sessions completed: 36 DX: Stent X 1 and myocardial Infarction   Cardiologist: [place] [facility] Family MD:  Dr. Lauris Chroman Class Time:  08:15  A.  Exercise Program:  Tolerates exercise @ 4.0 METS for 15 minutes, No Change functional capacity  0 % and Improved  muscular strength  141.8 %  B.  Mental Health:  Good mental attitude and Quality of Life (QOL)  improvements:  Overall  5.86 %, Health/Functioning 3.39 %, Socioeconomics 33.19 %, Psych/Spiritual 0 %, Family 15 %    C.  Education/Instruction/Skills  Knows THR for exercise, Uses Perceived Exertion Scale and/or Dyspnea Scale and Attended all education classes  Uses Perceived Exertion Scale and/or Dyspnea Scale  D.  Nutrition/Weight Control/Body Composition:  Adherence to prescribed nutrition program: good , % Body Fat  23.8%, Patient has lost 3.7 kg and Patient weight change: 8   E.  Blood Lipids    Lab Results  Component Value Date   CHOL 236* 03/26/2012   HDL 60 03/26/2012   LDLCALC 159* 03/26/2012   TRIG 84 03/26/2012   CHOLHDL 3.9 03/26/2012    F.  Lifestyle Changes:  Making positive lifestyle changes and Not smoking:  Quit 2007  G.  Symptoms noted with exercise:  Asymptomatic  Report Completed By:  Lelon Huh. Alberta Cairns RN   Comments:  Patient has completed all 36 sessions. His peak Mets were 4.0.  His resting HR was 104 and his resting BP was 142/80 and his peak HR was 114 and peak BP was 172/92. He did very well while in rehab. He has improved remarkably.

## 2012-08-13 NOTE — Progress Notes (Signed)
Cardiac Rehabilitation Program Outcomes Report   Orientation: 04/17/2012 Graduate Date:  tbd  Discharge Date: tbd # of sessions completed: 18 DX. Stent X1 and Myocardial Infarction  Cardiologist: [place] Bing Family MD:  Dr. Lauris Chroman Class Time:  08:15  A.  Exercise Program:  Tolerates exercise @ 4.0  METS for 15 minutes  B.  Mental Health:  Good mental attitude  C.  Education/Instruction/Skills  Knows THR for exercise and Uses Perceived Exertion Scale and/or Dyspnea Scale  Uses Perceived Exertion Scale and/or Dyspnea Scale  D.  Nutrition/Weight Control/Body Composition:  Adherence to prescribed nutrition program: good    E.  Blood Lipids    Lab Results  Component Value Date   CHOL 236* 03/26/2012   HDL 60 03/26/2012   LDLCALC 159* 03/26/2012   TRIG 84 03/26/2012   CHOLHDL 3.9 03/26/2012    F.  Lifestyle Changes:  Making positive lifestyle changes  G.  Symptoms noted with exercise:  Asymptomatic  Report Completed By:  Lelon Huh. Clevland Cork RN   Comments:  This is the patients halfway repprt. He has achieved a 4.0 mets. Resting HR was 89 and resting BP was 162/90, His peak HR was 109 and peak BP was 172/92. He has progressed very well. A graduation report will follow.

## 2012-08-13 NOTE — Progress Notes (Signed)
Cardiac Rehabilitation Program Outcomes Report   Orientation:  04/17/2012 Graduate Date:  07/30/2012 Discharge Date:  07/30/12 # of sessions completed: 36 DX: Stent X1 and MI  Cardiologist: [place] Bing Family MD:  Dr. Ernestine Conrad Class Time:  08:15  A.  Exercise Program:  Tolerates exercise @ 1.7 METS for 15 minutes  B.  Mental Health:  Good mental attitude  C.  Education/Instruction/Skills  Knows THR for exercise, Uses Perceived Exertion Scale and/or Dyspnea Scale and Attended 1 education classes  Uses Perceived Exertion Scale and/or Dyspnea Scale  D.  Nutrition/Weight Control/Body Composition:  Adherence to prescribed nutrition program: good    E.  Blood Lipids    Lab Results  Component Value Date   CHOL 236* 03/26/2012   HDL 60 03/26/2012   LDLCALC 159* 03/26/2012   TRIG 84 03/26/2012   CHOLHDL 3.9 03/26/2012    F.  Lifestyle Changes:  Making positive lifestyle changes  G.  Symptoms noted with exercise:  Asymptomatic  Report Completed By:  Lelon Huh. Eleanna Theilen RN   Comments:  Patient has completed the first week of rehab. He achieved a peak Mets of 1.9 during the week. His peak HR was 102 and his peak BP was 118/90. He has done very well for his first week.

## 2012-10-23 ENCOUNTER — Emergency Department (HOSPITAL_COMMUNITY): Payer: Medicaid Other

## 2012-10-23 ENCOUNTER — Encounter (HOSPITAL_COMMUNITY): Payer: Self-pay

## 2012-10-23 ENCOUNTER — Emergency Department (HOSPITAL_COMMUNITY)
Admission: EM | Admit: 2012-10-23 | Discharge: 2012-10-23 | Disposition: A | Discharge status: Home / Self Care | Payer: Medicaid Other | Attending: Emergency Medicine | Admitting: Emergency Medicine

## 2012-10-23 DIAGNOSIS — J209 Acute bronchitis, unspecified: Secondary | ICD-10-CM | POA: Insufficient documentation

## 2012-10-23 DIAGNOSIS — I10 Essential (primary) hypertension: Secondary | ICD-10-CM | POA: Insufficient documentation

## 2012-10-23 DIAGNOSIS — R059 Cough, unspecified: Secondary | ICD-10-CM | POA: Insufficient documentation

## 2012-10-23 DIAGNOSIS — Z87891 Personal history of nicotine dependence: Secondary | ICD-10-CM | POA: Insufficient documentation

## 2012-10-23 DIAGNOSIS — M79609 Pain in unspecified limb: Secondary | ICD-10-CM | POA: Insufficient documentation

## 2012-10-23 DIAGNOSIS — G8929 Other chronic pain: Secondary | ICD-10-CM | POA: Insufficient documentation

## 2012-10-23 DIAGNOSIS — Z8719 Personal history of other diseases of the digestive system: Secondary | ICD-10-CM | POA: Insufficient documentation

## 2012-10-23 DIAGNOSIS — J40 Bronchitis, not specified as acute or chronic: Secondary | ICD-10-CM

## 2012-10-23 DIAGNOSIS — I252 Old myocardial infarction: Secondary | ICD-10-CM | POA: Insufficient documentation

## 2012-10-23 DIAGNOSIS — R509 Fever, unspecified: Secondary | ICD-10-CM | POA: Insufficient documentation

## 2012-10-23 DIAGNOSIS — Z8781 Personal history of (healed) traumatic fracture: Secondary | ICD-10-CM | POA: Insufficient documentation

## 2012-10-23 DIAGNOSIS — R0602 Shortness of breath: Secondary | ICD-10-CM | POA: Insufficient documentation

## 2012-10-23 DIAGNOSIS — E119 Type 2 diabetes mellitus without complications: Secondary | ICD-10-CM | POA: Insufficient documentation

## 2012-10-23 DIAGNOSIS — Z8673 Personal history of transient ischemic attack (TIA), and cerebral infarction without residual deficits: Secondary | ICD-10-CM | POA: Insufficient documentation

## 2012-10-23 DIAGNOSIS — R05 Cough: Secondary | ICD-10-CM | POA: Insufficient documentation

## 2012-10-23 DIAGNOSIS — H53149 Visual discomfort, unspecified: Secondary | ICD-10-CM | POA: Insufficient documentation

## 2012-10-23 DIAGNOSIS — Z87828 Personal history of other (healed) physical injury and trauma: Secondary | ICD-10-CM | POA: Insufficient documentation

## 2012-10-23 DIAGNOSIS — R079 Chest pain, unspecified: Secondary | ICD-10-CM | POA: Insufficient documentation

## 2012-10-23 DIAGNOSIS — I2510 Atherosclerotic heart disease of native coronary artery without angina pectoris: Secondary | ICD-10-CM | POA: Insufficient documentation

## 2012-10-23 DIAGNOSIS — H538 Other visual disturbances: Secondary | ICD-10-CM | POA: Insufficient documentation

## 2012-10-23 DIAGNOSIS — Z8739 Personal history of other diseases of the musculoskeletal system and connective tissue: Secondary | ICD-10-CM | POA: Insufficient documentation

## 2012-10-23 DIAGNOSIS — R61 Generalized hyperhidrosis: Secondary | ICD-10-CM | POA: Insufficient documentation

## 2012-10-23 DIAGNOSIS — M549 Dorsalgia, unspecified: Secondary | ICD-10-CM | POA: Insufficient documentation

## 2012-10-23 DIAGNOSIS — Z79899 Other long term (current) drug therapy: Secondary | ICD-10-CM | POA: Insufficient documentation

## 2012-10-23 DIAGNOSIS — J329 Chronic sinusitis, unspecified: Secondary | ICD-10-CM

## 2012-10-23 LAB — CBC WITH DIFFERENTIAL/PLATELET
Basophils Absolute: 0 10*3/uL (ref 0.0–0.1)
Eosinophils Absolute: 0.2 10*3/uL (ref 0.0–0.7)
Eosinophils Relative: 2 % (ref 0–5)
MCH: 26.5 pg (ref 26.0–34.0)
MCHC: 32.3 g/dL (ref 30.0–36.0)
MCV: 82 fL (ref 78.0–100.0)
Platelets: 254 10*3/uL (ref 150–400)
RDW: 14.6 % (ref 11.5–15.5)

## 2012-10-23 LAB — TROPONIN I: Troponin I: <0.3 ng/mL (ref <0.30)

## 2012-10-23 LAB — BASIC METABOLIC PANEL
Calcium: 9.7 mg/dL (ref 8.4–10.5)
Creatinine, Ser: 0.93 mg/dL (ref 0.50–1.35)
GFR calc non Af Amer: >90 mL/min (ref >90)
Sodium: 137 mEq/L (ref 135–145)

## 2012-10-23 MED ORDER — AMOXICILLIN-POT CLAVULANATE 875-125 MG PO TABS: 1.0000 | ORAL_TABLET | Freq: Two times a day (BID) | ORAL | Status: DC

## 2012-10-23 MED ORDER — KETOROLAC TROMETHAMINE 30 MG/ML IJ SOLN
30.0000 mg | Freq: Once | INTRAMUSCULAR | Status: AC
  Administered 2012-10-23: 30 mg via INTRAVENOUS
  Filled 2012-10-23: qty 1

## 2012-10-23 MED ORDER — HYDROCODONE-HOMATROPINE 5-1.5 MG PO TABS: 1.0000 | ORAL_TABLET | ORAL | Status: DC | PRN
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
## 2012-10-23 NOTE — ED Notes (Signed)
C/o headache x3 days with blurred vision and cp.

## 2012-10-29 HISTORY — PX: CORONARY ANGIOPLASTY WITH STENT PLACEMENT: SHX49

## 2012-11-03 ENCOUNTER — Ambulatory Visit: Payer: Medicaid Other | Admitting: Adult Health

## 2012-11-25 ENCOUNTER — Encounter (HOSPITAL_COMMUNITY)
Admission: EM | Disposition: A | Discharge status: Home / Self Care | Payer: Self-pay | Source: Ambulatory Visit | Attending: Cardiology

## 2012-11-25 ENCOUNTER — Inpatient Hospital Stay (HOSPITAL_COMMUNITY)
Admission: EM | Admit: 2012-11-25 | Discharge: 2012-11-28 | DRG: 282 | Disposition: A | Discharge status: Home / Self Care | Payer: Medicaid Other | Source: Ambulatory Visit | Attending: Cardiology | Admitting: Cardiology

## 2012-11-25 ENCOUNTER — Encounter (HOSPITAL_COMMUNITY): Payer: Self-pay | Admitting: *Deleted

## 2012-11-25 ENCOUNTER — Emergency Department (HOSPITAL_COMMUNITY): Payer: Medicaid Other

## 2012-11-25 DIAGNOSIS — I2510 Atherosclerotic heart disease of native coronary artery without angina pectoris: Secondary | ICD-10-CM

## 2012-11-25 DIAGNOSIS — R0789 Other chest pain: Secondary | ICD-10-CM | POA: Clinically undetermined

## 2012-11-25 DIAGNOSIS — R079 Chest pain, unspecified: Secondary | ICD-10-CM

## 2012-11-25 DIAGNOSIS — Z9861 Coronary angioplasty status: Secondary | ICD-10-CM

## 2012-11-25 DIAGNOSIS — Z87891 Personal history of nicotine dependence: Secondary | ICD-10-CM

## 2012-11-25 DIAGNOSIS — Z8673 Personal history of transient ischemic attack (TIA), and cerebral infarction without residual deficits: Secondary | ICD-10-CM

## 2012-11-25 DIAGNOSIS — Z79899 Other long term (current) drug therapy: Secondary | ICD-10-CM

## 2012-11-25 DIAGNOSIS — I2582 Chronic total occlusion of coronary artery: Secondary | ICD-10-CM | POA: Diagnosis present

## 2012-11-25 DIAGNOSIS — K219 Gastro-esophageal reflux disease without esophagitis: Secondary | ICD-10-CM | POA: Diagnosis present

## 2012-11-25 DIAGNOSIS — Z720 Tobacco use: Secondary | ICD-10-CM

## 2012-11-25 DIAGNOSIS — Z7901 Long term (current) use of anticoagulants: Secondary | ICD-10-CM

## 2012-11-25 DIAGNOSIS — I2119 ST elevation (STEMI) myocardial infarction involving other coronary artery of inferior wall: Secondary | ICD-10-CM

## 2012-11-25 DIAGNOSIS — E119 Type 2 diabetes mellitus without complications: Secondary | ICD-10-CM | POA: Diagnosis present

## 2012-11-25 DIAGNOSIS — Z23 Encounter for immunization: Secondary | ICD-10-CM

## 2012-11-25 DIAGNOSIS — I10 Essential (primary) hypertension: Secondary | ICD-10-CM | POA: Diagnosis present

## 2012-11-25 DIAGNOSIS — I200 Unstable angina: Secondary | ICD-10-CM

## 2012-11-25 DIAGNOSIS — E7800 Pure hypercholesterolemia, unspecified: Secondary | ICD-10-CM | POA: Diagnosis present

## 2012-11-25 DIAGNOSIS — I214 Non-ST elevation (NSTEMI) myocardial infarction: Principal | ICD-10-CM | POA: Diagnosis present

## 2012-11-25 DIAGNOSIS — M129 Arthropathy, unspecified: Secondary | ICD-10-CM | POA: Diagnosis present

## 2012-11-25 DIAGNOSIS — I252 Old myocardial infarction: Secondary | ICD-10-CM

## 2012-11-25 DIAGNOSIS — G8929 Other chronic pain: Secondary | ICD-10-CM | POA: Diagnosis present

## 2012-11-25 HISTORY — PX: LEFT HEART CATHETERIZATION WITH CORONARY ANGIOGRAM: SHX5451

## 2012-11-25 LAB — COMPREHENSIVE METABOLIC PANEL
ALT: 17 U/L (ref 0–53)
AST: 29 U/L (ref 0–37)
Calcium: 9.4 mg/dL (ref 8.4–10.5)
Creatinine, Ser: 1.03 mg/dL (ref 0.50–1.35)
GFR calc Af Amer: >90 mL/min (ref >90)
GFR calc non Af Amer: 79 mL/min — ABNORMAL LOW (ref >90)
Glucose, Bld: 140 mg/dL — ABNORMAL HIGH (ref 70–99)
Sodium: 145 mEq/L (ref 135–145)
Total Protein: 7.3 g/dL (ref 6.0–8.3)

## 2012-11-25 LAB — GLUCOSE, CAPILLARY
Glucose-Capillary: 125 mg/dL — ABNORMAL HIGH (ref 70–99)
Glucose-Capillary: 114 mg/dL — ABNORMAL HIGH (ref 70–99)
Glucose-Capillary: 114 mg/dL — ABNORMAL HIGH (ref 70–99)
Glucose-Capillary: 159 mg/dL — ABNORMAL HIGH (ref 70–99)
Glucose-Capillary: 143 mg/dL — ABNORMAL HIGH (ref 70–99)

## 2012-11-25 LAB — POCT ACTIVATED CLOTTING TIME: Activated Clotting Time: 328 seconds

## 2012-11-25 LAB — MRSA PCR SCREENING: MRSA by PCR: NEGATIVE

## 2012-11-25 LAB — CBC
MCH: 27 pg (ref 26.0–34.0)
MCHC: 33.5 g/dL (ref 30.0–36.0)
MCV: 80.6 fL (ref 78.0–100.0)
Platelets: 216 10*3/uL (ref 150–400)

## 2012-11-25 LAB — TROPONIN I
Troponin I: <0.3 ng/mL (ref <0.30)
Troponin I: 2.78 ng/mL (ref <0.30)
Troponin I: 3.12 ng/mL (ref <0.30)
Troponin I: 6.88 ng/mL (ref <0.30)
Troponin I: 12.93 ng/mL (ref <0.30)

## 2012-11-25 LAB — HEMOGLOBIN A1C: Hgb A1c MFr Bld: 7 % — ABNORMAL HIGH (ref <5.7)

## 2012-11-25 SURGERY — LEFT HEART CATHETERIZATION WITH CORONARY ANGIOGRAM
Anesthesia: LOCAL

## 2012-11-25 MED ORDER — TICAGRELOR 90 MG PO TABS
90.0000 mg | ORAL_TABLET | Freq: Two times a day (BID) | ORAL | Status: DC
  Administered 2012-11-25 – 2012-11-28 (×7): 90 mg via ORAL
  Filled 2012-11-25 (×10): qty 1

## 2012-11-25 MED ORDER — SODIUM CHLORIDE 0.9 % IV BOLUS (SEPSIS)
250.0000 mL | Freq: Once | INTRAVENOUS | Status: AC
  Administered 2012-11-25: 250 mL via INTRAVENOUS

## 2012-11-25 MED ORDER — GABAPENTIN 100 MG PO CAPS
100.0000 mg | ORAL_CAPSULE | Freq: Three times a day (TID) | ORAL | Status: DC
  Administered 2012-11-25 – 2012-11-28 (×10): 100 mg via ORAL
  Filled 2012-11-25 (×12): qty 1

## 2012-11-25 MED ORDER — TICAGRELOR 90 MG PO TABS
90.0000 mg | ORAL_TABLET | Freq: Once | ORAL | Status: AC
  Administered 2012-11-25: 90 mg via ORAL

## 2012-11-25 MED ORDER — METOPROLOL TARTRATE 12.5 MG HALF TABLET
12.5000 mg | ORAL_TABLET | Freq: Two times a day (BID) | ORAL | Status: DC
  Administered 2012-11-25 – 2012-11-28 (×6): 12.5 mg via ORAL
  Filled 2012-11-25 (×8): qty 1

## 2012-11-25 MED ORDER — HEPARIN (PORCINE) IN NACL 2-0.9 UNIT/ML-% IJ SOLN
INTRAMUSCULAR | Status: AC
  Filled 2012-11-25: qty 1500

## 2012-11-25 MED ORDER — INSULIN ASPART 100 UNIT/ML ~~LOC~~ SOLN
0.0000 [IU] | Freq: Three times a day (TID) | SUBCUTANEOUS | Status: DC
  Administered 2012-11-25: 2 [IU] via SUBCUTANEOUS
  Administered 2012-11-27: 1 [IU] via SUBCUTANEOUS

## 2012-11-25 MED ORDER — SODIUM CHLORIDE 0.9 % IJ SOLN: 3.0000 mL | INTRAMUSCULAR | Status: DC | PRN

## 2012-11-25 MED ORDER — BIVALIRUDIN 250 MG IV SOLR
INTRAVENOUS | Status: AC
  Filled 2012-11-25: qty 250

## 2012-11-25 MED ORDER — SODIUM CHLORIDE 0.9 % IV SOLN
0.2500 mg/kg/h | INTRAVENOUS | Status: DC
  Filled 2012-11-25: qty 250

## 2012-11-25 MED ORDER — NITROGLYCERIN 0.4 MG SL SUBL: 0.4000 mg | SUBLINGUAL_TABLET | SUBLINGUAL | Status: DC | PRN

## 2012-11-25 MED ORDER — OXYCODONE-ACETAMINOPHEN 5-325 MG PO TABS
1.0000 | ORAL_TABLET | ORAL | Status: DC | PRN
  Administered 2012-11-25: 2 via ORAL
  Filled 2012-11-25: qty 2

## 2012-11-25 MED ORDER — MORPHINE SULFATE 2 MG/ML IJ SOLN: 2.0000 mg | INTRAMUSCULAR | Status: DC | PRN

## 2012-11-25 MED ORDER — QUETIAPINE FUMARATE ER 50 MG PO TB24
150.0000 mg | ORAL_TABLET | Freq: Every day | ORAL | Status: DC
  Administered 2012-11-25 – 2012-11-27 (×3): 150 mg via ORAL
  Filled 2012-11-25 (×5): qty 3

## 2012-11-25 MED ORDER — LIDOCAINE HCL (PF) 1 % IJ SOLN
INTRAMUSCULAR | Status: AC
  Filled 2012-11-25: qty 30

## 2012-11-25 MED ORDER — ASPIRIN 81 MG PO CHEW: 324.0000 mg | CHEWABLE_TABLET | Freq: Once | ORAL | Status: DC

## 2012-11-25 MED ORDER — HEPARIN BOLUS VIA INFUSION
4000.0000 [IU] | Freq: Once | INTRAVENOUS | Status: AC
  Administered 2012-11-25: 4000 [IU] via INTRAVENOUS
  Filled 2012-11-25: qty 4000

## 2012-11-25 MED ORDER — MIDAZOLAM HCL 2 MG/2ML IJ SOLN
INTRAMUSCULAR | Status: AC
  Filled 2012-11-25: qty 2

## 2012-11-25 MED ORDER — ACETAMINOPHEN 325 MG PO TABS: 650.0000 mg | ORAL_TABLET | ORAL | Status: DC | PRN

## 2012-11-25 MED ORDER — GI COCKTAIL ~~LOC~~
30.0000 mL | Freq: Once | ORAL | Status: AC
  Administered 2012-11-25: 30 mL via ORAL
  Filled 2012-11-25: qty 30

## 2012-11-25 MED ORDER — ASPIRIN EC 81 MG PO TBEC
81.0000 mg | DELAYED_RELEASE_TABLET | Freq: Every day | ORAL | Status: DC
  Administered 2012-11-26 – 2012-11-28 (×3): 81 mg via ORAL
  Filled 2012-11-25 (×3): qty 1

## 2012-11-25 MED ORDER — SODIUM CHLORIDE 0.9 % IV SOLN
INTRAVENOUS | Status: DC
  Administered 2012-11-25: 50 mL via INTRAVENOUS

## 2012-11-25 MED ORDER — SODIUM CHLORIDE 0.9 % IJ SOLN: 3.0000 mL | Freq: Two times a day (BID) | INTRAMUSCULAR | Status: DC

## 2012-11-25 MED ORDER — NITROGLYCERIN IN D5W 200-5 MCG/ML-% IV SOLN
2.0000 ug/min | INTRAVENOUS | Status: DC
  Administered 2012-11-25: 5 ug/min via INTRAVENOUS
  Administered 2012-11-26: 20 ug/min via INTRAVENOUS
  Filled 2012-11-25: qty 250

## 2012-11-25 MED ORDER — SODIUM CHLORIDE 0.9 % IV SOLN
INTRAVENOUS | Status: AC
  Administered 2012-11-25: 75 mL/h via INTRAVENOUS

## 2012-11-25 MED ORDER — SODIUM CHLORIDE 0.9 % IV SOLN: 250.0000 mL | INTRAVENOUS | Status: DC | PRN

## 2012-11-25 MED ORDER — SODIUM CHLORIDE 0.9 % IV SOLN: 1.0000 mL/kg/h | INTRAVENOUS | Status: DC

## 2012-11-25 MED ORDER — SIMVASTATIN 40 MG PO TABS
40.0000 mg | ORAL_TABLET | Freq: Every evening | ORAL | Status: DC
  Administered 2012-11-25 – 2012-11-27 (×3): 40 mg via ORAL
  Filled 2012-11-25 (×4): qty 1

## 2012-11-25 MED ORDER — SODIUM CHLORIDE 0.9 % IV SOLN
20.0000 mL | INTRAVENOUS | Status: DC
  Administered 2012-11-25: 20 mL via INTRAVENOUS

## 2012-11-25 MED ORDER — FENTANYL CITRATE 0.05 MG/ML IJ SOLN
INTRAMUSCULAR | Status: AC
  Filled 2012-11-25: qty 2

## 2012-11-25 MED ORDER — ONDANSETRON HCL 4 MG/2ML IJ SOLN: 4.0000 mg | Freq: Four times a day (QID) | INTRAMUSCULAR | Status: DC | PRN

## 2012-11-25 MED ORDER — MORPHINE SULFATE 2 MG/ML IJ SOLN
2.0000 mg | INTRAMUSCULAR | Status: DC | PRN
  Administered 2012-11-25: 2 mg via INTRAVENOUS
  Filled 2012-11-25: qty 1

## 2012-11-25 MED ORDER — SODIUM CHLORIDE 0.9 % IV SOLN
INTRAVENOUS | Status: DC | PRN
  Administered 2012-11-25: 10 mL/h via INTRAVENOUS

## 2012-11-25 MED ORDER — LISINOPRIL 2.5 MG PO TABS
2.5000 mg | ORAL_TABLET | Freq: Every day | ORAL | Status: DC
  Administered 2012-11-26 – 2012-11-28 (×3): 2.5 mg via ORAL
  Filled 2012-11-25 (×4): qty 1

## 2012-11-25 MED ORDER — HEPARIN (PORCINE) IN NACL 100-0.45 UNIT/ML-% IJ SOLN
950.0000 [IU]/h | INTRAMUSCULAR | Status: DC
  Administered 2012-11-25: 950 [IU]/h via INTRAVENOUS
  Filled 2012-11-25: qty 250

## 2012-11-25 MED ORDER — NITROGLYCERIN 0.2 MG/ML ON CALL CATH LAB
INTRAVENOUS | Status: AC
  Filled 2012-11-25: qty 1

## 2012-11-25 MED ORDER — ASPIRIN 81 MG PO CHEW: 324.0000 mg | CHEWABLE_TABLET | ORAL | Status: DC
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
## 2012-11-25 NOTE — ED Notes (Signed)
Attempted to call report. Floor RN unable to accept report.  

## 2012-11-25 NOTE — CV Procedure (Signed)
Cardiac Catheterization Operative Report  Siraj Dermody Novell [id number] 1/28/201412:34 PM Ernestine Conrad, MD  Procedure Performed:  1. Left Heart Catheterization 2. Selective Coronary Angiography 3. Left ventricular angiogram 4.   Attempted PTCA of the chronically occluded Circumflex  Operator: Verne Carrow, MD  Arterial access site:  Right radial artery.   Indication:  57 yo male with history of  HTN, HLD, DM, CAD s/p prior PCI with stent placement OM, Diagonal and RCA with last presentation in 2013 with bare metal stent placed proximal RCA in May 2013 in setting of STEMI and totally occluded RCA. Now admitted with chest pain, troponin elevated. Urgent cath.                               Procedure Details: The risks, benefits, complications, treatment options, and expected outcomes were discussed with the patient. The patient and/or family concurred with the proposed plan, giving informed consent. The patient was brought to the cath lab after IV hydration was begun and oral premedication was given. The patient was further sedated with Versed and Fentanyl. The right wrist was assessed with an Allens test which was positive. The right wrist was prepped and draped in a sterile fashion. 1% lidocaine was used for local anesthesia. Using the modified Seldinger access technique, a 5 French sheath was placed in the right radial artery. 3 mg Verapamil was given through the sheath. A bolus of Angiomax was given and a drip was started.  Standard diagnostic catheters were used to perform selective coronary angiography. I engaged the left main with a XB LAD 3.5 guiding catheter. The films were carefully reviewed. His RCA disease is chronic distally with patent RCA stent int he proximal segment. The Circumflex is now totally occluded just beyond the ostium. There is collateral filling of the mid and distal Circumflex which is known to be a diffusely diseased vessel by past cath films. I elected to  try and open the Circumflex. The ACT was over 200. I then attempted to pass a Runthrough wire down the Circumflex. This wire would not pass. I then used a Whisper wire without success. This appeared to be a chronic occlusion. I then inserted a Finecross catheter with a Fielder XT wire and advanced this system into the Circumflex. Angiography through the catheter confirmed I was intra-luminal. I could not pass a wire down the vessel. Multiple wires were used. The PCI was stopped.  A pigtail catheter was used to perform a left ventricular angiogram. The sheath was removed from the right radial artery and a Terumo hemostasis band was applied at the arteriotomy site on the right wrist.    There were no immediate complications. The patient was taken to the recovery area in stable condition.   Hemodynamic Findings: Central aortic pressure: 105/72 Left ventricular pressure: 103/9/12  Angiographic Findings:  Left main:  No obstructive disease.   Left Anterior Descending Artery: Moderate caliber vessel that courses to the apex. The proximal vessel has serial 30% stenoses. The mid vessel has a 40% stenosis just beyond the takeoff of the diagonal branch. The distal LAD has a 50% focal stenosis. The diagonal branch is moderate in caliber with patent proximal stent. The mid portion of the diagonal branch has a 80% stenosis (1.5 mm vessel).   Circumflex Artery: 100% proximal occlusion. The mid and distal vessel including a distal obtuse marginal branch fills from left to left collaterals.   Right Coronary  Artery: Large, dominant vessel with patent proximal stent without restenosis. The distal vessel has serial 30% stenoses. The PDA is moderate in caliber and has a focal 50% stenosis. The Posterolateral branch has a sub-total occlusion and fills from right to right collaterals.(This is known from prior cath)  Left Ventricular Angiogram: LVEF 50-55%  Impression: 1. Triple vessel CAD with chronic occlusion of  Circumflex (unable to open with PCI), chronic occlusion right sided posterolateral branch, moderate disease LAD and diagonal.  2.  NSTEMI 3.  Preserved LV systolic function.   Recommendations: Unable to open the Circumflex which appears to be chronically occluded. Will monitor in CCU. Will continue ASA and Brilinta. Will continue beta blocker,statin. Will resume NTG drip.        Complications:  None. The patient tolerated the procedure well.

## 2012-11-25 NOTE — Progress Notes (Signed)
CRITICAL VALUE ALERT  Critical value received:  Troponin 2.78  Date of notification:  11/25/12  Time of notification:  0940  Critical value read back: YES  Nurse who received alert: Jaelon Gatley   MD notified (1st page):Rothbart/Trish   Time of first page:  0945  MD notified (2nd page):  Time of second page:  Responding MD:  Rosann Auerbach  Time MD responded: [phone]

## 2012-11-25 NOTE — ED Notes (Signed)
Patient transported to X-ray 

## 2012-11-25 NOTE — Interval H&P Note (Signed)
History and Physical Interval Note:  11/25/2012 11:10 AM  Daniel Li  has presented today for cardiac cath with the diagnosis of nstemi  The various methods of treatment have been discussed with the patient and family. After consideration of risks, benefits and other options for treatment, the patient has consented to  Procedure(s) (LRB) with comments: LEFT HEART CATHETERIZATION WITH CORONARY ANGIOGRAM (N/A) as a surgical intervention .  The patient's history has been reviewed, patient examined, no change in status, stable for surgery.  I have reviewed the patient's chart and labs.  Questions were answered to the patient's satisfaction.     MCALHANY,CHRISTOPHER
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
## 2012-11-25 NOTE — Progress Notes (Signed)
PROGRESS NOTE  Subjective:   Pt is a 57 yo with hx of cad who presents with symptoms consistent with unstable angina. His initial troponin levels were normal. The turn positive overnight.  He has been started on heparin. He has been kept n.p.o. for possible cardiac catheterization today.  He is already on Brilinta 90 mg twice a day and aspirin 81 mg a day. He's also on lisinopril 2.5 mg a day and metoprolol 12.5 mg twice a day.  Objective:    Vital Signs:   Temp:  [98.1 F (36.7 C)-98.3 F (36.8 C)] 98.1 F (36.7 C) (01/28 0800) Pulse Rate:  [53-75] 56  (01/28 0900) Resp:  [14-17] 15  (01/28 0900) BP: (89-124)/(56-89) 104/64 mmHg (01/28 0900) SpO2:  [96 %-100 %] 99 % (01/28 0900) Weight:  [172 lb 6.4 oz (78.2 kg)] 172 lb 6.4 oz (78.2 kg) (01/28 0700)      24-hour weight change: Weight change:   Weight trends: Filed Weights   11/25/12 0700  Weight: 172 lb 6.4 oz (78.2 kg)    Intake/Output:  01/27 0701 - 01/28 0700 In: 70 [I.V.:70] Out: -  Total I/O In: 100 [I.V.:100] Out: -    Physical Exam: BP 104/64  Pulse 56  Temp 98.1 F (36.7 C) (Oral)  Resp 15  Ht 5\' 6"  (1.676 m)  Wt 172 lb 6.4 oz (78.2 kg)  BMI 27.83 kg/m2  SpO2 99%  General: Vital signs reviewed and noted. sleepy  Head: Normocephalic, atraumatic.  Eyes: conjunctivae/corneas clear.  EOM's intact.   Throat: normal  Neck:  normal  Lungs:   clear  Heart:  RR  Abdomen:  Soft, non-tender, non-distended    Extremities: No edema, pulses are intact.   Neurologic: A&O X3, CN II - XII are grossly intact.   Psych: Normal     Labs: BMET:  Basename 11/25/12 0351  NA 145  K 3.8  CL 105  CO2 30  GLUCOSE 140*  BUN 8  CREATININE 1.03  CALCIUM 9.4  MG 2.3  PHOS --    Liver function tests:  Basename 11/25/12 0351  AST 29  ALT 17  ALKPHOS 71  BILITOT 0.2*  PROT 7.3  ALBUMIN 3.5    Basename 11/25/12 0440  LIPASE 39  AMYLASE --    CBC:  Basename 11/25/12 0351  WBC 8.3  NEUTROABS --    HGB 14.3  HCT 42.7  MCV 80.6  PLT 216    Cardiac Enzymes:  Basename 11/25/12 0832 11/25/12 0352  CKTOTAL -- --  CKMB -- --  TROPONINI 2.78*3.12* <0.30    Coagulation Studies:  Basename 11/25/12 0832  LABPROT 16.1*  INR 1.32    Other: No components found with this basename: POCBNP:3 No results found for this basename: DDIMER in the last 72 hours No results found for this basename: HGBA1C in the last 72 hours No results found for this basename: CHOL,HDL,LDLCALC,TRIG,CHOLHDL in the last 72 hours No results found for this basename: TSH,T4TOTAL,FREET3,T3FREE,THYROIDAB in the last 72 hours No results found for this basename: VITAMINB12,FOLATE,FERRITIN,TIBC,IRON,RETICCTPCT in the last 72 hours   Other results:  Tele:  NSR, TWI anteriorly  Medications:    Infusions:    . sodium chloride 20 mL (11/25/12 0435)  . sodium chloride 50 mL (11/25/12 0739)  . heparin 950 Units/hr (11/25/12 0906)    Scheduled Medications:    . aspirin EC  81 mg Oral Daily  . gabapentin  100 mg Oral TID  . insulin aspart  0-9  Units Subcutaneous TID WC  . lisinopril  2.5 mg Oral Daily  . metoprolol tartrate  12.5 mg Oral BID  . QUEtiapine Fumarate  150 mg Oral QHS  . simvastatin  40 mg Oral QPM  . Ticagrelor  90 mg Oral BID    Assessment/ Plan:    1. Coronary artery disease the patient has a history of coronary artery disease and is status post stenting in the past. He presents now with several hours of symptoms consistent with unstable angina and now has positive troponin levels. His EKG is clearly different than his previous EKG in December and shows T-wave inversions in the anterior leads.  Patient needs an urgent heart catheterization this morning. We will keep him n.p.o.  We discussed the risks, benefits, and options of heart catheterization. He understands and agrees to proceed.  He has only been taking his Brilinta 51 ONCE a day.  Will re-bolus him    Disposition: cath  today.  Length of Stay: 0  Vesta Mixer, Montez Hageman., MD, [facility] 11/25/2012, 10:24 AM Office [phone] Pager [phone]

## 2012-11-25 NOTE — Progress Notes (Signed)
Utilization Review Completed.Daniel Li T12/05/2013  

## 2012-11-25 NOTE — H&P (View-Only) (Signed)
 PROGRESS NOTE  Subjective:   Pt is a 57 yo with hx of cad who presents with symptoms consistent with unstable angina. His initial troponin levels were normal. The turn positive overnight.  He has been started on heparin. He has been kept n.p.o. for possible cardiac catheterization today.  He is already on Brilinta 90 mg twice a day and aspirin 81 mg a day. He's also on lisinopril 2.5 mg a day and metoprolol 12.5 mg twice a day.  Objective:    Vital Signs:   Temp:  [98.1 F (36.7 C)-98.3 F (36.8 C)] 98.1 F (36.7 C) (01/28 0800) Pulse Rate:  [53-75] 56  (01/28 0900) Resp:  [14-17] 15  (01/28 0900) BP: (89-124)/(56-89) 104/64 mmHg (01/28 0900) SpO2:  [96 %-100 %] 99 % (01/28 0900) Weight:  [172 lb 6.4 oz (78.2 kg)] 172 lb 6.4 oz (78.2 kg) (01/28 0700)      24-hour weight change: Weight change:   Weight trends: Filed Weights   11/25/12 0700  Weight: 172 lb 6.4 oz (78.2 kg)    Intake/Output:  01/27 0701 - 01/28 0700 In: 70 [I.V.:70] Out: -  Total I/O In: 100 [I.V.:100] Out: -    Physical Exam: BP 104/64  Pulse 56  Temp 98.1 F (36.7 C) (Oral)  Resp 15  Ht 5' 6" (1.676 m)  Wt 172 lb 6.4 oz (78.2 kg)  BMI 27.83 kg/m2  SpO2 99%  General: Vital signs reviewed and noted. sleepy  Head: Normocephalic, atraumatic.  Eyes: conjunctivae/corneas clear.  EOM's intact.   Throat: normal  Neck:  normal  Lungs:   clear  Heart:  RR  Abdomen:  Soft, non-tender, non-distended    Extremities: No edema, pulses are intact.   Neurologic: A&O X3, CN II - XII are grossly intact.   Psych: Normal     Labs: BMET:  Basename 11/25/12 0351  NA 145  K 3.8  CL 105  CO2 30  GLUCOSE 140*  BUN 8  CREATININE 1.03  CALCIUM 9.4  MG 2.3  PHOS --    Liver function tests:  Basename 11/25/12 0351  AST 29  ALT 17  ALKPHOS 71  BILITOT 0.2*  PROT 7.3  ALBUMIN 3.5    Basename 11/25/12 0440  LIPASE 39  AMYLASE --    CBC:  Basename 11/25/12 0351  WBC 8.3  NEUTROABS --    HGB 14.3  HCT 42.7  MCV 80.6  PLT 216    Cardiac Enzymes:  Basename 11/25/12 0832 11/25/12 0352  CKTOTAL -- --  CKMB -- --  TROPONINI 2.78*3.12* <0.30    Coagulation Studies:  Basename 11/25/12 0832  LABPROT 16.1*  INR 1.32    Other: No components found with this basename: POCBNP:3 No results found for this basename: DDIMER in the last 72 hours No results found for this basename: HGBA1C in the last 72 hours No results found for this basename: CHOL,HDL,LDLCALC,TRIG,CHOLHDL in the last 72 hours No results found for this basename: TSH,T4TOTAL,FREET3,T3FREE,THYROIDAB in the last 72 hours No results found for this basename: VITAMINB12,FOLATE,FERRITIN,TIBC,IRON,RETICCTPCT in the last 72 hours   Other results:  Tele:  NSR, TWI anteriorly  Medications:    Infusions:    . sodium chloride 20 mL (11/25/12 0435)  . sodium chloride 50 mL (11/25/12 0739)  . heparin 950 Units/hr (11/25/12 0906)    Scheduled Medications:    . aspirin EC  81 mg Oral Daily  . gabapentin  100 mg Oral TID  . insulin aspart  0-9   Units Subcutaneous TID WC  . lisinopril  2.5 mg Oral Daily  . metoprolol tartrate  12.5 mg Oral BID  . QUEtiapine Fumarate  150 mg Oral QHS  . simvastatin  40 mg Oral QPM  . Ticagrelor  90 mg Oral BID    Assessment/ Plan:    1. Coronary artery disease the patient has a history of coronary artery disease and is status post stenting in the past. He presents now with several hours of symptoms consistent with unstable angina and now has positive troponin levels. His EKG is clearly different than his previous EKG in December and shows T-wave inversions in the anterior leads.  Patient needs an urgent heart catheterization this morning. We will keep him n.p.o.  We discussed the risks, benefits, and options of heart catheterization. He understands and agrees to proceed.  He has only been taking his Brilinta 90 ONCE a day.  Will re-bolus him    Disposition: cath  today.  Length of Stay: 0  Philip J. Nahser, Jr., MD, FACC 11/25/2012, 10:24 AM Office [phone] Pager [phone]    

## 2012-11-25 NOTE — Progress Notes (Signed)
Patient to Cath Lab.

## 2012-11-25 NOTE — ED Notes (Signed)
Per EMS: [facility] coming [facility] with c/o chest pain. Pt states he took nitro x2 prior to EMS arrival with no relief. Pt states it's a burning sensation that starts from epigastric radiating up center chest. Watching TV when pain started tonight, 10/10 pain, skin warm and dry, respirations equal and unlabored. A&Ox4. EMS gave pt 324 asa and nitro x2 with no relief. 20g in left [facility]

## 2012-11-26 DIAGNOSIS — I214 Non-ST elevation (NSTEMI) myocardial infarction: Principal | ICD-10-CM | POA: Diagnosis present

## 2012-11-26 DIAGNOSIS — R0789 Other chest pain: Secondary | ICD-10-CM | POA: Clinically undetermined

## 2012-11-26 DIAGNOSIS — I219 Acute myocardial infarction, unspecified: Secondary | ICD-10-CM

## 2012-11-26 LAB — GLUCOSE, CAPILLARY
Glucose-Capillary: 111 mg/dL — ABNORMAL HIGH (ref 70–99)
Glucose-Capillary: 107 mg/dL — ABNORMAL HIGH (ref 70–99)
Glucose-Capillary: 112 mg/dL — ABNORMAL HIGH (ref 70–99)

## 2012-11-26 LAB — PROTIME-INR: INR: 1.42 (ref 0.00–1.49)

## 2012-11-26 LAB — BASIC METABOLIC PANEL
Chloride: 105 mEq/L (ref 96–112)
GFR calc Af Amer: >90 mL/min (ref >90)
GFR calc non Af Amer: 82 mL/min — ABNORMAL LOW (ref >90)
Potassium: 3.8 mEq/L (ref 3.5–5.1)
Sodium: 141 mEq/L (ref 135–145)

## 2012-11-26 LAB — CBC
Hemoglobin: 12.5 g/dL — ABNORMAL LOW (ref 13.0–17.0)
MCHC: 32.6 g/dL (ref 30.0–36.0)
RDW: 15.1 % (ref 11.5–15.5)
WBC: 9.7 10*3/uL (ref 4.0–10.5)

## 2012-11-26 MED ORDER — ISOSORBIDE MONONITRATE ER 30 MG PO TB24
30.0000 mg | ORAL_TABLET | Freq: Every day | ORAL | Status: DC
  Administered 2012-11-26 – 2012-11-28 (×3): 30 mg via ORAL
  Filled 2012-11-26 (×3): qty 1

## 2012-11-26 MED ORDER — HYDROCODONE-ACETAMINOPHEN 5-325 MG PO TABS
1.0000 | ORAL_TABLET | ORAL | Status: DC | PRN
  Administered 2012-11-26 (×2): 1 via ORAL
  Administered 2012-11-27: 2 via ORAL
  Administered 2012-11-27: 1 via ORAL
  Filled 2012-11-26 (×2): qty 1
  Filled 2012-11-26: qty 2
  Filled 2012-11-26: qty 1

## 2012-11-26 MED FILL — Dextrose Inj 5%: INTRAVENOUS | Qty: 50 | Status: AC

## 2012-11-26 NOTE — Progress Notes (Signed)
11/26/2012  1030  CK MB and Trop results given to [facility] PA  Lilliah Priego, Linnell Fulling

## 2012-11-26 NOTE — Progress Notes (Signed)
  Echocardiogram 2D Echocardiogram has been performed.  Ellender Hose A 11/26/2012, 1:39 PM

## 2012-11-26 NOTE — Progress Notes (Signed)
Pt states he is having 9/10 CP, NTG has been increased from to . EKG obtained no new changes noted, Morphine 2mg  IV given. Pt still complaining of 9/10 CP VSS. MD made aware, order for NS bolus obtained and given since BP was getting soft (92/53). Will continue to monitor closely.

## 2012-11-26 NOTE — Progress Notes (Signed)
Patient Name: Daniel Li Date of Encounter: 11/26/2012  Principal Problem:  *Non-ST elevation myocardial infarction (NSTEMI), initial episode of care Active Problems:  Acute chest wall pain  Hypertension, essential  CAD (coronary artery disease)  Hypercholesterolemia  Tobacco abuse    SUBJECTIVE: Pt c/o 9/10-10/10 chest pain, starts in his abdomen, goes up his chest and through to the upper part of his back. Worse with deep inspiration and cough. NTG not much help, morphine helps, has not been pain-free overnight. Eating breakfast without difficulty. Tenderness noted to chest wall > abdomen.  (Per RN, pt slept well but would c/o 10/10 pain when he woke.)  OBJECTIVE Filed Vitals:   11/26/12 0400 11/26/12 0500 11/26/12 0600 11/26/12 0700  BP: 93/49 88/56 89/57  91/59  Pulse: 61 59 61 61  Temp:      TempSrc:      Resp:      Height:      Weight:      SpO2: 94% 92% 94% 95%    Intake/Output Summary (Last 24 hours) at 11/26/12 0814 Last data filed at 11/26/12 0700  Gross per 24 hour  Intake 2219.13 ml  Output    450 ml  Net 1769.13 ml   Filed Weights   11/25/12 0700  Weight: 172 lb 6.4 oz (78.2 kg)    PHYSICAL EXAM General: Well developed, well nourished, male in mild distress. Head: Normocephalic, atraumatic.  Neck: Supple without bruits, JVD not elevated. Lungs:  Resp regular and unlabored, few basilar rales, decreased with cough. Heart: RRR, S1, S2, no S3, S4, or murmur. No rub Abdomen: Soft, non-tender, non-distended, BS + x 4.  Extremities: No clubbing, cyanosis, no edema.  Neuro: Alert and oriented X 3. Moves all extremities spontaneously. Psych: Normal affect.  LABS: CBC:  Basename 11/26/12 0530 11/25/12 0351  WBC 9.7 8.3  NEUTROABS -- --  HGB 12.5* 14.3  HCT 38.3* 42.7  MCV 81.5 80.6  PLT 196 216   INR:  Basename 11/26/12 0530  INR 1.42   Basic Metabolic Panel:  Basename 11/26/12 0530 11/25/12 0351  NA 141 145  K 3.8 3.8  CL 105  105  CO2 28 30  GLUCOSE 102* 140*  BUN 7 8  CREATININE 1.00 1.03  CALCIUM 9.0 9.4  MG -- 2.3  PHOS -- --   Liver Function Tests:  Basename 11/25/12 0351  AST 29  ALT 17  ALKPHOS 71  BILITOT 0.2*  PROT 7.3  ALBUMIN 3.5   Cardiac Enzymes:  Basename 11/25/12 1849 11/25/12 1307 11/25/12 0832  CKTOTAL -- -- --  CKMB -- -- --  CKMBINDEX -- -- --  TROPONINI 12.93* 6.88* 2.78*3.12*   Lab Results  Component Value Date   CKTOTAL 1553* 11/26/2012   CKMB 34.8* 11/26/2012   CKMB INDEX 2.2    TROPONINI 12.68* 11/26/2012     BNP: Pro B Natriuretic peptide (BNP)  Date/Time Value Range Status  11/25/2012  8:32 AM 33.4  0 - 125 pg/mL Final  03/26/2012 12:57 AM 25.2  0 - 125 pg/mL Final   Hemoglobin A1C:  Basename 11/25/12 0832  HGBA1C 7.0*   TELE:   SR, occ PVCs and pairs  ECG: 26-Nov-2012 06:57:18 T wave abnormality, consider anterolateral ischemia - T wave inversions in V4-5 more pronounced than previous ECGs Vent. rate 60 BPM PR interval 162 ms QRS duration 88 ms QT/QTc 434/434 ms P-R-T axes 58 27 -80  Radiology/Studies: Dg Chest 2 View 11/25/2012  *RADIOLOGY REPORT*  Clinical Data: Chest  pain  CHEST - 2 VIEW  Comparison: 10/23/2012  Findings: Aortic tortuosity.  Normal heart size.  Clear lungs.  No pleural effusion or pneumothorax.  No acute osseous finding.  IMPRESSION: No radiographic evidence of acute cardiopulmonary process.   Original Report Authenticated By: Jearld Lesch, M.D.     Current Medications:     . aspirin EC  81 mg Oral Daily  . gabapentin  100 mg Oral TID  . insulin aspart  0-9 Units Subcutaneous TID WC  . lisinopril  2.5 mg Oral Daily  . metoprolol tartrate  12.5 mg Oral BID  . QUEtiapine Fumarate  150 mg Oral QHS  . simvastatin  40 mg Oral QPM  . Ticagrelor  90 mg Oral BID      . nitroGLYCERIN 30 mcg/min (11/26/12 0600)    ASSESSMENT AND PLAN: Principal Problem:  *Non-ST elevation myocardial infarction (NSTEMI), initial episode of  care - see cath report, culprit lesion is CFX (U/A to do PCI), RCA with disease but unchanged, EF 55%, medical therapy. Pt on ASA/BB/statin/ACE. No med changes as BP has been soft. Will recheck troponin and CKMB. OK to wean NTG drip if enzymes trending down.  Addendum: enzymes rechecked and slight downward trend on troponin and index normal on CKMB although both are elevated. Pt had no increase in pain with decreasing IV NTG and seems more comfortable with oral pain meds.  Active Problems:  Acute chest wall pain - will add oral pain meds and try to get better control of symptoms.   Hypertension, essential - good control.   CAD (coronary artery disease) - see the cath report   Hypercholesterolemia - on statin, ck profile   Tobacco abuse - cessation encouraged.  Plan - tx TCU today and continue to monitor, treat symptoms.  Melida Quitter , PA-C 8:14 AM 11/26/2012

## 2012-11-26 NOTE — Progress Notes (Signed)
    I have seen and examined the patient. I agree with the above note with the addition of : The patient presented with non-ST elevation myocardial infarction. He continues to have atypical chest discomfort worse with deep breath with some back discomfort. He is still on nitroglycerin drip. I reviewed the cardiac cath films. The left circumflex was diffusely diseased and tortuous on previous angiography. It was occluded on angiography from yesterday with what seems to be a large organized thrombus proximally. Occlusion could not be crossed in spite of multiple attempts by Dr. Sanjuana Kava. The left circumflex had left to left collaterals. I recommend aggressive treatment for coronary artery disease and modifying risk factors. He does not need further revascularization. I will add long-acting nitroglycerin and try to wean him off the drip. Also given that he has some pleuritic discomfort, I will request an echocardiogram to make sure he has no pericardial effusion. Continue dual antiplatelet therapy for a year given his recent MI.  Lorine Bears MD, [facility] 11/26/2012 4:46 PM

## 2012-11-27 ENCOUNTER — Inpatient Hospital Stay (HOSPITAL_COMMUNITY): Payer: Medicaid Other

## 2012-11-27 LAB — GLUCOSE, CAPILLARY
Glucose-Capillary: 122 mg/dL — ABNORMAL HIGH (ref 70–99)
Glucose-Capillary: 94 mg/dL (ref 70–99)

## 2012-11-27 NOTE — Progress Notes (Signed)
CARDIAC REHAB PHASE I   PRE:  Rate/Rhythm: 80SR  BP:  Supine:   Sitting: 118/66  Standing:    SaO2: 94%RA  MODE:  Ambulation: 450 ft   POST:  Rate/Rhythem: 84SR  BP:  Supine:   Sitting: 118/70  Standing:    SaO2: 96%RA 1332-1422 Pt ambulated fairly well with assistance x1. Pt stated CP was 5/10 when entering the room, and 3/10 after walking. C/o pain and muscle soreness in jaw, shoulders, and upper back. Educated on MI, anti-platelet medication, exercise, nutrition, restrictions, NTG, and smoking cessation. Pt states that he is currently working on smoking cessation by weaning off tobacco products. Pt gave permission to contact about CRP II at [facility]. Returned pt to bed with call light and phone within reach.    Deetta Perla

## 2012-11-27 NOTE — Progress Notes (Signed)
Noted pt is MI with plan to treat med. Please advise when pt is able to ambulate. Will follow. Thx. Ethelda Chick CES, ACSM

## 2012-11-27 NOTE — Progress Notes (Signed)
Subjective:  No left-sided chest discomfort.  Still some rightsided chest pain worse with movement.  Objective:  Vital Signs in the last 24 hours: Temp:  [97.3 F (36.3 C)-99.2 F (37.3 C)] 98.8 F (37.1 C) (01/30 0356) Pulse Rate:  [61-79] 79  (01/29 2227) Resp:  [18-20] 18  (01/30 0352) BP: (97-123)/(58-80) 100/60 mmHg (01/30 0352) SpO2:  [91 %-96 %] 92 % (01/30 0356)  Intake/Output from previous day: 01/29 0701 - 01/30 0700 In: 496 [P.O.:480; I.V.:16] Out: 500 [Urine:500] Intake/Output from this shift:       . aspirin EC  81 mg Oral Daily  . gabapentin  100 mg Oral TID  . insulin aspart  0-9 Units Subcutaneous TID WC  . isosorbide mononitrate  30 mg Oral Daily  . lisinopril  2.5 mg Oral Daily  . metoprolol tartrate  12.5 mg Oral BID  . QUEtiapine Fumarate  150 mg Oral QHS  . simvastatin  40 mg Oral QPM  . Ticagrelor  90 mg Oral BID      Physical Exam: The patient appears to be in no distress.  Head and neck exam reveals that the pupils are equal and reactive.  The extraocular movements are full.  There is no scleral icterus.  Mouth and pharynx are benign.  No lymphadenopathy.  No carotid bruits.  The jugular venous pressure is normal.  Thyroid is not enlarged or tender.  Chest is clear to percussion and auscultation.  Mild inspiratory rales right base.  No rub.  Expansion of the chest is symmetrical.  Heart reveals no abnormal lift or heave.  First and second heart sounds are normal.  There is no murmur gallop rub or click.  The abdomen is soft and nontender.  Bowel sounds are normoactive.  There is no hepatosplenomegaly or mass.  There are no abdominal bruits.  Extremities reveal no phlebitis or edema.  Pedal pulses are good.  There is no cyanosis or clubbing.  Neurologic exam is normal strength and no lateralizing weakness.  No sensory deficits.  Integument reveals no rash  Lab Results:  Basename 11/26/12 0530 11/25/12 0351  WBC 9.7 8.3  HGB 12.5* 14.3   PLT 196 216    Basename 11/26/12 0530 11/25/12 0351  NA 141 145  K 3.8 3.8  CL 105 105  CO2 28 30  GLUCOSE 102* 140*  BUN 7 8  CREATININE 1.00 1.03    Basename 11/26/12 0918 11/25/12 1849  TROPONINI 12.68* 12.93*   Hepatic Function Panel  Basename 11/25/12 0351  PROT 7.3  ALBUMIN 3.5  AST 29  ALT 17  ALKPHOS 71  BILITOT 0.2*  BILIDIR --  IBILI --   No results found for this basename: CHOL in the last 72 hours No results found for this basename: PROTIME in the last 72 hours  Imaging: Imaging results have been reviewed  Cardiac Studies: Telemetry shows NSR.  2D echo 11/26/12 shows:  Study Conclusions  Left ventricle: The cavity size was normal. Wall thickness was increased in a pattern of mild LVH. Systolic function was normal. The estimated ejection fraction was in the range of 50% to 55%. There is mild hypokinesis of the distalanteroseptal myocardium. Doppler parameters are consistent with abnormal left ventricular relaxation (grade 1 diastolic dysfunction). Transthoracic echocardiography. M-mode, complete 2D, spectral Doppler, and color Doppler. Height: Height: 167.6cm. Height: 66in. Weight: Weight: 78.2kg. Weight: 172lb. Body mass index: BMI: 27.8kg/m^2. Body surface area: BSA: 1.74m^2. Blood pressure: 105/76. Patient status: Inpatient. Location: ICU/CCU  Assessment/Plan:  Patient Active Hospital Problem List: Non-ST elevation myocardial infarction (NSTEMI), initial episode of care (11/26/2012)   Assessment: No angina off IV NTG   Plan: Advance activity, transfer to telemetry.  Cardiac rehab   CAD (coronary artery disease) (03/26/2012)   Assessment: Triple vessel disease.    Plan: Medical management. DAPT for at least a year.   Acute chest wall pain (11/26/2012)   Assessment: Still having rightsided chest pain.   Plan: Repeat chest xray and EKG today. Anticipate home 11/28/12   LOS: 2 days    Cassell Clement 11/27/2012, 8:35 AM

## 2012-11-28 ENCOUNTER — Encounter (HOSPITAL_COMMUNITY): Payer: Self-pay | Admitting: Cardiology

## 2012-11-28 LAB — GLUCOSE, CAPILLARY: Glucose-Capillary: 106 mg/dL — ABNORMAL HIGH (ref 70–99)

## 2012-11-28 MED ORDER — ASPIRIN 81 MG PO TBEC: 81.0000 mg | DELAYED_RELEASE_TABLET | Freq: Every day | ORAL | Status: DC

## 2012-11-28 MED ORDER — ACETAMINOPHEN 325 MG PO TABS: 650.0000 mg | ORAL_TABLET | ORAL | Status: DC | PRN

## 2012-11-28 MED ORDER — AMOXICILLIN-POT CLAVULANATE 875-125 MG PO TABS: 1.0000 | ORAL_TABLET | Freq: Two times a day (BID) | ORAL | Status: DC

## 2012-11-28 MED ORDER — ISOSORBIDE MONONITRATE ER 30 MG PO TB24: 30.0000 mg | ORAL_TABLET | Freq: Every day | ORAL | Status: DC

## 2012-11-28 NOTE — Progress Notes (Signed)
Cardiology Progress Note Patient Name: Daniel Li Date of Encounter: 11/28/2012, 6:33 AM     Subjective  No chest pain or sob. Mild right sided chest soreness and back pain (improved with Vicodin).    Objective   Telemetry: Sinus rhythm 70s  Medications: . aspirin EC  81 mg Oral Daily  . gabapentin  100 mg Oral TID  . insulin aspart  0-9 Units Subcutaneous TID WC  . isosorbide mononitrate  30 mg Oral Daily  . lisinopril  2.5 mg Oral Daily  . metoprolol tartrate  12.5 mg Oral BID  . QUEtiapine Fumarate  150 mg Oral QHS  . simvastatin  40 mg Oral QPM  . Ticagrelor  90 mg Oral BID      Physical Exam: Temp:  [98.8 F (37.1 C)-99.5 F (37.5 C)] 99.5 F (37.5 C) (01/30 2100) Pulse Rate:  [76-90] 90  (01/30 2252) Resp:  [16-18] 16  (01/30 2100) BP: (105-125)/(67-89) 121/76 mmHg (01/30 2252) SpO2:  [90 %-100 %] 100 % (01/30 2100)  General: Well developed black male, in no acute distress. Head: Normocephalic, atraumatic, sclera non-icteric, nares are without discharge.  Neck: Supple. No JVD Lungs: Clear bilaterally to auscultation without wheezes, rales, or rhonchi. Breathing is unlabored. Heart: RRR S1 S2 without murmurs, rubs, or gallops.  Abdomen: Soft, non-tender, non-distended with normoactive bowel sounds. No rebound/guarding. No obvious abdominal masses. Msk:  Strength and tone appear normal for age. Extremities: No edema. No clubbing or cyanosis. Distal pedal pulses are intact and equal bilaterally. Neuro: Alert and oriented X 3. Moves all extremities spontaneously. Psych:  Responds to questions appropriately with a flat affect.   Intake/Output Summary (Last 24 hours) at 11/28/12 0633 Last data filed at 11/27/12 1100  Gross per 24 hour  Intake    360 ml  Output    500 ml  Net   -140 ml    Labs:  Basename 11/26/12 0530  NA 141  K 3.8  CL 105  CO2 28  GLUCOSE 102*  BUN 7  CREATININE 1.00  CALCIUM 9.0   Basename 11/26/12 0530  WBC 9.7    HGB 12.5*  HCT 38.3*  MCV 81.5  PLT 196   Basename 11/27/12 0925 11/26/12 0918 11/25/12 1849 11/25/12 1307  CKTOTAL -- 1553* -- --  CKMB -- 34.8* -- --  TROPONINI 7.23* 12.68* 12.93* 6.88*   Basename 11/25/12 0832  HGBA1C 7.0*    Radiology/Studies:    11/27/2012 - CHEST - 2 VIEW Findings: Heart size and vascularity are normal and the lungs are clear.  No osseous abnormality of significance.  IMPRESSION: Essentially normal chest.  No acute disease.     11/25/12 - Cardiac Cath Hemodynamic Findings:  Central aortic pressure: 105/72  Left ventricular pressure: 103/9/12  Angiographic Findings:  Left main: No obstructive disease.  Left Anterior Descending Artery: Moderate caliber vessel that courses to the apex. The proximal vessel has serial 30% stenoses. The mid vessel has a 40% stenosis just beyond the takeoff of the diagonal branch. The distal LAD has a 50% focal stenosis. The diagonal branch is moderate in caliber with patent proximal stent. The mid portion of the diagonal branch has a 80% stenosis (1.5 mm vessel).  Circumflex Artery: 100% proximal occlusion. The mid and distal vessel including a distal obtuse marginal branch fills from left to left collaterals.  Right Coronary Artery: Large, dominant vessel with patent proximal stent without restenosis. The distal vessel has serial 30% stenoses. The  PDA is moderate in caliber and has a focal 50% stenosis. The Posterolateral branch has a sub-total occlusion and fills from right to right collaterals.(This is known from prior cath)  Left Ventricular Angiogram: LVEF 50-55%  Impression:  1. 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  2. NSTEMI  3. Preserved LV systolic function.  Recommendations: Unable to open the Circumflex which appears to be chronically occluded. Will monitor in CCU. Will continue ASA and  Brilinta. Will continue beta blocker,statin. Will resume NTG drip.   11/26/12 - Echo Study Conclusions: Left ventricle: The cavity size was normal. Wall thickness was increased in a pattern of mild LVH. Systolic function was normal. The estimated ejection fraction was in the range of 50% to 55%. There is mild hypokinesis of the distalanteroseptal myocardium. Doppler parameters are consistent with abnormal left ventricular relaxation (grade 1 diastolic dysfunction).      Assessment and Plan   1. NSTEMI/CAD: Cath 11/25/12 showed chronic occlusion of LCx and PL branch, mod dz of LAD and diagonal, and preserved LV systolic fxn. EF 50-55% by echo with grade 1 diastolic dysfunction and mild hypokinesis of the distalanteroseptal myocardium. Cath site stable. Able to ambulate with cardiac rehab with improvement in chest pain. Cont medical therapy with ASA, Brilinta, BB, Imdur (new), statin, ACEI.   2. Acute Chest Wall Pain: C/o right sided chest pain that actually improved with ambulation yesterday. Still "sore" this morning. CXR without acute findings. EKG without acute ST/T changes. Troponin trending down. Suspect musculoskeletal in nature.  3. Hyperlipidemia: Cont statin and follow up lipid panel as outpatient.  4. Tobacco Abuse: Encourage cessation  5. Diabetes Mellitus, Type 2: A1c 7.0. Resume Glipizide at DC and follow up with PCP  6. Hypertension: HCTZ stopped on admission. BB and ACEI continued. Imdur added. SBPs 100-120s. MD advise on holding HCTZ at discharge.  Disposition: Likely home today pending evaluation by MD  Signed, HOPE, JESSICA PA-C  Patient examined.  No further ischemic heart pain.  He still has a lot of musculoskeletal chest wall pain. Chest xray okay.  EKG shows improving ST-T changes laterally BP low normal on current therapy so will not restart HCTZ at this time. It can be restarted as an outpatient if BP trends upward. Would use tylenol for chest wall pain. Okay for  discharge today.
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
## 2012-12-10 ENCOUNTER — Ambulatory Visit: Payer: Medicaid Other | Admitting: Physician Assistant

## 2012-12-11 ENCOUNTER — Ambulatory Visit: Payer: Medicaid Other | Admitting: Adult Health

## 2012-12-17 ENCOUNTER — Encounter: Payer: Self-pay | Admitting: Adult Health

## 2012-12-17 ENCOUNTER — Ambulatory Visit (INDEPENDENT_AMBULATORY_CARE_PROVIDER_SITE_OTHER): Payer: Medicaid Other | Admitting: Adult Health

## 2012-12-17 VITALS — BP 142/94 | HR 93 | Ht 66.5 in | Wt 183.2 lb

## 2012-12-17 DIAGNOSIS — Z720 Tobacco use: Secondary | ICD-10-CM

## 2012-12-17 DIAGNOSIS — E7800 Pure hypercholesterolemia, unspecified: Secondary | ICD-10-CM

## 2012-12-17 DIAGNOSIS — I10 Essential (primary) hypertension: Secondary | ICD-10-CM

## 2012-12-17 DIAGNOSIS — F17200 Nicotine dependence, unspecified, uncomplicated: Secondary | ICD-10-CM

## 2012-12-17 DIAGNOSIS — I2510 Atherosclerotic heart disease of native coronary artery without angina pectoris: Secondary | ICD-10-CM

## 2012-12-17 NOTE — Progress Notes (Deleted)
Name: Daniel Li    DOB: 1956-06-21  Age: 57 y.o.  MR#: [id number]       PCP:  Avon Gully, MD      Insurance: Payor: MEDICAID [place]  Plan: MEDICAID [place] ACCESS  Product Type: *No Product type*    CC:    Chief Complaint  Patient presents with  . Follow-up    weakness in left arm and shoulder area since hospital stay    VS Filed Vitals:   12/17/12 1414  BP: 142/94  Pulse: 93  Height: 5' 6.5" (1.689 m)  Weight: 183 lb 3.2 oz (83.099 kg)  SpO2: 97%    Weights Current Weight  12/17/12 183 lb 3.2 oz (83.099 kg)  11/25/12 172 lb 6.4 oz (78.2 kg)  11/25/12 172 lb 6.4 oz (78.2 kg)    Blood Pressure  BP Readings from Last 3 Encounters:  12/17/12 142/94  11/28/12 116/70  11/28/12 116/70     Admit date:  (Not on file) Last encounter with RMR:  12/11/2012   Allergy Pork-derived products  Current Outpatient Prescriptions  Medication Sig Dispense Refill  . acetaminophen (TYLENOL) 325 MG tablet Take 2 tablets (650 mg total) by mouth every 4 (four) hours as needed for pain.      [name] Kitchen amoxicillin-clavulanate (AUGMENTIN) 875-125 MG per tablet Take 1 tablet by mouth every 12 (twelve) hours. Complete as previously prescribed      . aspirin EC 81 MG EC tablet Take 1 tablet (81 mg total) by mouth daily.      [name] Kitchen gabapentin (NEURONTIN) 100 MG capsule Take 1 capsule (100 mg total) by mouth 3 (three) times daily.  30 capsule  0  . glipiZIDE (GLUCOTROL XL) 10 MG 24 hr tablet Take 1 tablet (10 mg total) by mouth daily.  30 tablet  1  . isosorbide mononitrate (IMDUR) 30 MG 24 hr tablet Take 1 tablet (30 mg total) by mouth daily.  30 tablet  2  . lisinopril (PRINIVIL,ZESTRIL) 2.5 MG tablet Take 1 tablet (2.5 mg total) by mouth daily.  30 tablet  3  . metoprolol tartrate (LOPRESSOR) 25 MG tablet Take 0.5 tablets (12.5 mg total) by mouth 2 (two) times daily.  60 tablet  3  . nitroGLYCERIN (NITROSTAT) 0.4 MG SL tablet Place 1 tablet (0.4 mg total) under the tongue every 5 (five) minutes as  needed for chest pain (up to 3 doses).  25 tablet  3  . QUEtiapine Fumarate (SEROQUEL XR) 150 MG 24 hr tablet Take 150 mg by mouth at bedtime.      . simvastatin (ZOCOR) 40 MG tablet Take 1 tablet (40 mg total) by mouth every evening.  30 tablet  3  . Ticagrelor (BRILINTA) 90 MG TABS tablet Take 1 tablet (90 mg total) by mouth 2 (two) times daily.  60 tablet  6   No current facility-administered medications for this visit.    Discontinued Meds:   There are no discontinued medications.  Patient Active Problem List  Diagnosis  . Acute MI, inferior wall, initial episode of care  . Diabetes mellitus type 2 with complications  . Hypertension, essential  . CAD (coronary artery disease)  . Hypercholesterolemia  . Tobacco abuse  . Non-ST elevation myocardial infarction (NSTEMI), initial episode of care  . Acute chest wall pain    LABS @BMET3 @  @CMPRESULT3 @ @CBC3 @  Lipid Panel     Component Value Date/Time   CHOL 236* 03/26/2012 0430   TRIG 84 03/26/2012 0430   HDL 60 03/26/2012  0430   CHOLHDL 3.9 03/26/2012 0430   VLDL 17 03/26/2012 0430   LDLCALC 159* 03/26/2012 0430    ABG No results found for this basename: phart, pco2, pco2art, po2, po2art, hco3, tco2, acidbasedef, o2sat     BNP (last 3 results)  Recent Labs  03/26/12 0057 11/25/12 0832  PROBNP 25.2 33.4   Cardiac Panel (last 3 results) No results found for this basename: CKTOTAL, CKMB, TROPONINI, RELINDX,  in the last 72 hours  Iron/TIBC/Ferritin No results found for this basename: iron, tibc, ferritin     EKG Orders placed in visit on 12/17/12  . EKG 12-LEAD     Prior Assessment and Plan Problem List as of 12/17/2012     ICD-9-CM     Cardiology Problems   Acute MI, inferior wall, initial episode of care   Hypertension, essential   Last Assessment & Plan   07/08/2012 Office Visit Written 07/08/2012 12:18 PM by Jodelle Gross, NP     Blood pressure is not only control. This is due in part because he has  not taken his medications today. I advised him to take his medications every day on time with he is to continue a low sodium diet and quit smoking. I have advised him to bring his bottles with him on the next visit so that we can evaluate his medications better than by verbal list he provides. I will check some labs today to include a CBC and chemistries along with a hemoglobin A1c which will be also sent to his primary care physician. We will see him again in 6 months unless becomes symptomatic.    CAD (coronary artery disease)   Last Assessment & Plan   07/08/2012 Office Visit Written 07/08/2012 12:19 PM by Jodelle Gross, NP     He has no further complaints of recurrent chest discomfort or shortness of breath. I have provided him samples of Brilinta. He is advised a low-sodium low-cholesterol diet. He will need to have followup cholesterol studies iwhen he is fasting ongoing assessment and risk reduction.    Hypercholesterolemia   Last Assessment & Plan   07/08/2012 Office Visit Written 07/08/2012 12:19 PM by Jodelle Gross, NP     He has had cholesterol evaluated 3 months ago and remains on simvastatin 40 mg daily. He will he followup studies in the next 6 months.    Non-ST elevation myocardial infarction (NSTEMI), initial episode of care     Other   Diabetes mellitus type 2 with complications   Tobacco abuse   Acute chest wall pain       Imaging: Dg Chest 2 View  11/27/2012  *RADIOLOGY REPORT*  Clinical Data: Right-sided chest pain.  Shortness of breath and cough.  CHEST - 2 VIEW  Comparison: 11/25/2012  Findings: Heart size and vascularity are normal and the lungs are clear.  No osseous abnormality of significance.  IMPRESSION: Essentially normal chest.  No acute disease.   Original Report Authenticated By: Francene Boyers, M.D.    Dg Chest 2 View  11/25/2012  *RADIOLOGY REPORT*  Clinical Data: Chest pain  CHEST - 2 VIEW  Comparison: 10/23/2012  Findings: Aortic tortuosity.  Normal  heart size.  Clear lungs.  No pleural effusion or pneumothorax.  No acute osseous finding.  IMPRESSION: No radiographic evidence of acute cardiopulmonary process.   Original Report Authenticated By: Jearld Lesch, M.D.

## 2012-12-17 NOTE — Assessment & Plan Note (Signed)
He is stopped for now, but doesn't think he will be able to do this for long. I have encourage him in his efforts to stop completely.

## 2012-12-17 NOTE — Patient Instructions (Signed)
Your physician recommends that you schedule a follow-up appointment in:  1 month with Laurine Blazer  Stop Simvastatin(zocor) for one week and restart, if cramps continue call the office at [phone].
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
## 2012-12-17 NOTE — Assessment & Plan Note (Signed)
He is mildly elevated today on this visit. Will continue to follow for adjustment of medications if needed.

## 2012-12-17 NOTE — Assessment & Plan Note (Signed)
He is now asymptomatic. He continues on DAPT and nitrates. He has stopped smoking for now. Will continue to treat medically. Non-obstructive disease elsewhere. I have encouraged medical compliance, especially Brilinta. He verbalizes understanding. We will see him again in one month for close follow up.

## 2012-12-17 NOTE — Assessment & Plan Note (Signed)
He is experiencing what appears to be myalgia pain with walking and cramping in his hands on simvastatin. I have asked him to stop this medication for one week to see if this is the case. If symptoms subside, will try Pravachol and add Co-Q-10 to medical regimen.  I have checked distal pulses and found them to be strong, therefore do not have suspicion for intermittent claudication.

## 2013-01-13 ENCOUNTER — Telehealth: Payer: Self-pay

## 2013-01-13 NOTE — Telephone Encounter (Signed)
Pt was referred by Dr. Felecia Shelling for screening colonoscopy. He said he has a hx of cardiac problems. OV with Tana Coast, PA scheduled for 01/28/2013 at 1:30 PM.
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
## 2013-01-14 ENCOUNTER — Encounter: Payer: Self-pay | Admitting: Adult Health

## 2013-01-14 ENCOUNTER — Ambulatory Visit (INDEPENDENT_AMBULATORY_CARE_PROVIDER_SITE_OTHER): Payer: Medicaid Other | Admitting: Adult Health

## 2013-01-14 VITALS — BP 146/96 | HR 90 | Ht 66.5 in | Wt 184.0 lb

## 2013-01-14 DIAGNOSIS — I10 Essential (primary) hypertension: Secondary | ICD-10-CM

## 2013-01-14 DIAGNOSIS — F17200 Nicotine dependence, unspecified, uncomplicated: Secondary | ICD-10-CM

## 2013-01-14 DIAGNOSIS — E7800 Pure hypercholesterolemia, unspecified: Secondary | ICD-10-CM

## 2013-01-14 DIAGNOSIS — Z720 Tobacco use: Secondary | ICD-10-CM

## 2013-01-14 MED ORDER — TICAGRELOR 90 MG PO TABS: 90.0000 mg | ORAL_TABLET | Freq: Two times a day (BID) | ORAL | Status: DC

## 2013-01-14 NOTE — Patient Instructions (Addendum)
Your physician recommends that you schedule a follow-up appointment in: 6 months  

## 2013-01-14 NOTE — Assessment & Plan Note (Signed)
He is without complaint concerning chest pain, shortness of breath, dizziness, or lack of energy. He has not had to use any nitroglycerin sublingual. He is not complaining of any melena or using dual antiplatelet therapy. Continue him on his current medication regimen if he is stable and responding well to it. We will see him again in 6 months unless he becomes symptomatic. He knows to call us if he does have recurrent discomfort.

## 2013-01-14 NOTE — Assessment & Plan Note (Signed)
He has stopped smoking for 3 weeks. He is using a Nicoderm patch for assistance. I congratulated him on his efforts and have encouraged him to continue in this lifestyle change.

## 2013-01-14 NOTE — Progress Notes (Deleted)
Name: Daniel Li    DOB: [birth year]  Age: 57 y.o.  MR#: [id number]       PCP:  Avon Gully, MD      Insurance: Payor: MEDICAID [place]  Plan: MEDICAID [place] ACCESS  Product Type: *No Product type*    CC:   No chief complaint on file.   VS Filed Vitals:   01/14/13 1259  BP: 146/96  Pulse: 90  Height: 5' 6.5" (1.689 m)  Weight: 184 lb (83.462 kg)  SpO2: 98%    Weights Current Weight  01/14/13 184 lb (83.462 kg)  12/17/12 183 lb 3.2 oz (83.099 kg)  11/25/12 172 lb 6.4 oz (78.2 kg)    Blood Pressure  BP Readings from Last 3 Encounters:  01/14/13 146/96  12/17/12 142/94  11/28/12 116/70     Admit date:  (Not on file) Last encounter with RMR:  12/17/2012   Allergy Pork-derived products  Current Outpatient Prescriptions  Medication Sig Dispense Refill  . acetaminophen (TYLENOL) 325 MG tablet Take 2 tablets (650 mg total) by mouth every 4 (four) hours as needed for pain.      [name] Kitchen amoxicillin-clavulanate (AUGMENTIN) 875-125 MG per tablet Take 1 tablet by mouth every 12 (twelve) hours. Complete as previously prescribed      . aspirin EC 81 MG EC tablet Take 1 tablet (81 mg total) by mouth daily.      [name] Kitchen glipiZIDE (GLUCOTROL XL) 10 MG 24 hr tablet Take 1 tablet (10 mg total) by mouth daily.  30 tablet  1  . isosorbide mononitrate (IMDUR) 30 MG 24 hr tablet Take 1 tablet (30 mg total) by mouth daily.  30 tablet  2  . lisinopril (PRINIVIL,ZESTRIL) 2.5 MG tablet Take 1 tablet (2.5 mg total) by mouth daily.  30 tablet  3  . metoprolol tartrate (LOPRESSOR) 25 MG tablet Take 0.5 tablets (12.5 mg total) by mouth 2 (two) times daily.  60 tablet  3  . nitroGLYCERIN (NITROSTAT) 0.4 MG SL tablet Place 1 tablet (0.4 mg total) under the tongue every 5 (five) minutes as needed for chest pain (up to 3 doses).  25 tablet  3  . simvastatin (ZOCOR) 40 MG tablet Take 1 tablet (40 mg total) by mouth every evening.  30 tablet  3  . Ticagrelor (BRILINTA) 90 MG TABS tablet Take 1 tablet (90 mg total)  by mouth 2 (two) times daily.  60 tablet  6   No current facility-administered medications for this visit.    Discontinued Meds:    Medications Discontinued During This Encounter  Medication Reason  . QUEtiapine Fumarate (SEROQUEL XR) 150 MG 24 hr tablet Patient Preference    Patient Active Problem List  Diagnosis  . Diabetes mellitus type 2 with complications  . Hypertension, essential  . CAD (coronary artery disease)  . Hypercholesterolemia  . Tobacco abuse  . Non-ST elevation myocardial infarction (NSTEMI), initial episode of care    LABS    Component Value Date/Time   NA 141 11/26/2012 0530   NA 145 11/25/2012 0351   NA 137 10/23/2012 1345   K 3.8 11/26/2012 0530   K 3.8 11/25/2012 0351   K 4.0 10/23/2012 1345   CL 105 11/26/2012 0530   CL 105 11/25/2012 0351   CL 101 10/23/2012 1345   CO2 28 11/26/2012 0530   CO2 30 11/25/2012 0351   CO2 29 10/23/2012 1345   GLUCOSE 102* 11/26/2012 0530   GLUCOSE 140* 11/25/2012 0351   GLUCOSE 97 10/23/2012 1345  BUN 7 11/26/2012 0530   BUN 8 11/25/2012 0351   BUN 9 10/23/2012 1345   CREATININE 1.00 11/26/2012 0530   CREATININE 1.03 11/25/2012 0351   CREATININE 0.93 10/23/2012 1345   CREATININE 0.88 07/08/2012 1226   CREATININE 1.02 04/24/2012 0752   CALCIUM 9.0 11/26/2012 0530   CALCIUM 9.4 11/25/2012 0351   CALCIUM 9.7 10/23/2012 1345   GFRNONAA 82* 11/26/2012 0530   GFRNONAA 79* 11/25/2012 0351   GFRNONAA >90 10/23/2012 1345   GFRAA >90 11/26/2012 0530   GFRAA >90 11/25/2012 0351   GFRAA >90 10/23/2012 1345   CMP     Component Value Date/Time   NA 141 11/26/2012 0530   K 3.8 11/26/2012 0530   CL 105 11/26/2012 0530   CO2 28 11/26/2012 0530   GLUCOSE 102* 11/26/2012 0530   BUN 7 11/26/2012 0530   CREATININE 1.00 11/26/2012 0530   CREATININE 0.88 07/08/2012 1226   CALCIUM 9.0 11/26/2012 0530   PROT 7.3 11/25/2012 0351   ALBUMIN 3.5 11/25/2012 0351   AST 29 11/25/2012 0351   ALT 17 11/25/2012 0351   ALKPHOS 71 11/25/2012 0351   BILITOT  0.2* 11/25/2012 0351   GFRNONAA 82* 11/26/2012 0530   GFRAA >90 11/26/2012 0530       Component Value Date/Time   WBC 9.7 11/26/2012 0530   WBC 8.3 11/25/2012 0351   WBC 8.7 10/23/2012 1345   HGB 12.5* 11/26/2012 0530   HGB 14.3 11/25/2012 0351   HGB 14.1 10/23/2012 1345   HCT 38.3* 11/26/2012 0530   HCT 42.7 11/25/2012 0351   HCT 43.7 10/23/2012 1345   MCV 81.5 11/26/2012 0530   MCV 80.6 11/25/2012 0351   MCV 82.0 10/23/2012 1345    Lipid Panel     Component Value Date/Time   CHOL 236* 03/26/2012 0430   TRIG 84 03/26/2012 0430   HDL 60 03/26/2012 0430   CHOLHDL 3.9 03/26/2012 0430   VLDL 17 03/26/2012 0430   LDLCALC 159* 03/26/2012 0430    ABG No results found for this basename: phart, pco2, pco2art, po2, po2art, hco3, tco2, acidbasedef, o2sat     Lab Results  Component Value Date   TSH 0.759 03/26/2012   BNP (last 3 results)  Recent Labs  03/26/12 0057 11/25/12 0832  PROBNP 25.2 33.4   Cardiac Panel (last 3 results) No results found for this basename: CKTOTAL, CKMB, TROPONINI, RELINDX,  in the last 72 hours  Iron/TIBC/Ferritin No results found for this basename: iron, tibc, ferritin     EKG Orders placed in visit on 12/17/12  . EKG 12-LEAD     Prior Assessment and Plan Problem List as of 01/14/2013     ICD-9-CM     Cardiology Problems   Hypertension, essential   Last Assessment & Plan   12/17/2012 Office Visit Written 12/17/2012  4:16 PM by Jodelle Gross, NP     He is mildly elevated today on this visit. Will continue to follow for adjustment of medications if needed.     CAD (coronary artery disease)   Last Assessment & Plan   12/17/2012 Office Visit Written 12/17/2012  4:15 PM by Jodelle Gross, NP     He is now asymptomatic. He continues on DAPT and nitrates. He has stopped smoking for now. Will continue to treat medically. Non-obstructive disease elsewhere. I have encouraged medical compliance, especially Brilinta. He verbalizes understanding. We will  see him again in one month for close follow up.     Hypercholesterolemia  Last Assessment & Plan   12/17/2012 Office Visit Written 12/17/2012  4:18 PM by Jodelle Gross, NP     He is experiencing what appears to be myalgia pain with walking and cramping in his hands on simvastatin. I have asked him to stop this medication for one week to see if this is the case. If symptoms subside, will try Pravachol and add Co-Q-10 to medical regimen.  I have checked distal pulses and found them to be strong, therefore do not have suspicion for intermittent claudication.     Non-ST elevation myocardial infarction (NSTEMI), initial episode of care     Other   Diabetes mellitus type 2 with complications   Tobacco abuse   Last Assessment & Plan   12/17/2012 Office Visit Written 12/17/2012  4:19 PM by Jodelle Gross, NP     He is stopped for now, but doesn't think he will be able to do this for long. I have encourage him in his efforts to stop completely.        Imaging: No results found.

## 2013-01-14 NOTE — Assessment & Plan Note (Signed)
He denies any myalgias with use of simvastatin. We will have followup labs completed in 6 months to include fasting lipids and LFT's  if not completed by primary care physician.

## 2013-01-14 NOTE — Assessment & Plan Note (Signed)
Excellent control of blood pressure on this visit. He is to continue on lisinopril, and metoprolol. Followup labs will be ordered in 6 months on return visit.

## 2013-01-28 ENCOUNTER — Ambulatory Visit (INDEPENDENT_AMBULATORY_CARE_PROVIDER_SITE_OTHER): Payer: Medicaid Other | Admitting: Gastroenterology

## 2013-01-28 ENCOUNTER — Encounter: Payer: Self-pay | Admitting: Gastroenterology

## 2013-01-28 VITALS — BP 151/94 | HR 106 | Temp 98.1°F | Ht 66.0 in | Wt 182.4 lb

## 2013-01-28 DIAGNOSIS — Z79899 Other long term (current) drug therapy: Secondary | ICD-10-CM | POA: Insufficient documentation

## 2013-01-28 DIAGNOSIS — Z1211 Encounter for screening for malignant neoplasm of colon: Secondary | ICD-10-CM

## 2013-01-28 MED ORDER — PEG 3350-KCL-NA BICARB-NACL 420 G PO SOLR: 4000.0000 mL | ORAL | Status: DC

## 2013-01-28 NOTE — Progress Notes (Signed)
Brilinta of with screening colonoscopy. As he was just started on this medication in January, we do not hold this for a minimum of one year unless in emergency situations, GI Bleed etc.

## 2013-01-28 NOTE — Patient Instructions (Addendum)
We have scheduled you for a colonoscopy with Dr. [name]. Please see separate instructions.  I will discuss your case with Daniel Li and if we need to postpone your procedure we will let you know.
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
## 2013-01-28 NOTE — Assessment & Plan Note (Signed)
Patient presents to schedule his first ever screening colonoscopy. He is without any GI symptoms. He is on antiplatelet therapy for h/o cardiac stents. Last MI in 10/2012 with unsuccessful stenting at that time. I will touch base with his cardiologist, Joni Reining to see if he is fit for procedure. I suspect that we can maintain him on Brilinta for the colonoscopy with caveat if large polyp encounter he may have to come back at later date to have removed off Brilinta.   Will tentatively put him on schedule for colonoscopy. Phenergan 25mg  IV 30 mins before procedure to augment conscious sedation given history of chronic etoh use.

## 2013-01-28 NOTE — Progress Notes (Signed)
CC PCP 

## 2013-01-31 ENCOUNTER — Emergency Department (HOSPITAL_COMMUNITY): Payer: Medicaid Other

## 2013-01-31 ENCOUNTER — Encounter (HOSPITAL_COMMUNITY): Payer: Self-pay

## 2013-01-31 ENCOUNTER — Observation Stay (HOSPITAL_COMMUNITY)
Admission: EM | Admit: 2013-01-31 | Discharge: 2013-02-02 | Disposition: A | Discharge status: Home / Self Care | Payer: Medicaid Other | Attending: Internal Medicine | Admitting: Internal Medicine

## 2013-01-31 DIAGNOSIS — E119 Type 2 diabetes mellitus without complications: Secondary | ICD-10-CM | POA: Insufficient documentation

## 2013-01-31 DIAGNOSIS — M5030 Other cervical disc degeneration, unspecified cervical region: Secondary | ICD-10-CM | POA: Insufficient documentation

## 2013-01-31 DIAGNOSIS — E785 Hyperlipidemia, unspecified: Secondary | ICD-10-CM | POA: Insufficient documentation

## 2013-01-31 DIAGNOSIS — R9431 Abnormal electrocardiogram [ECG] [EKG]: Secondary | ICD-10-CM

## 2013-01-31 DIAGNOSIS — F1020 Alcohol dependence, uncomplicated: Secondary | ICD-10-CM | POA: Insufficient documentation

## 2013-01-31 DIAGNOSIS — I2510 Atherosclerotic heart disease of native coronary artery without angina pectoris: Secondary | ICD-10-CM | POA: Insufficient documentation

## 2013-01-31 DIAGNOSIS — R079 Chest pain, unspecified: Principal | ICD-10-CM | POA: Insufficient documentation

## 2013-01-31 DIAGNOSIS — F10929 Alcohol use, unspecified with intoxication, unspecified: Secondary | ICD-10-CM

## 2013-01-31 DIAGNOSIS — I10 Essential (primary) hypertension: Secondary | ICD-10-CM | POA: Insufficient documentation

## 2013-01-31 HISTORY — DX: Other cervical disc degeneration, unspecified cervical region: M50.30

## 2013-01-31 LAB — CBC WITH DIFFERENTIAL/PLATELET
Eosinophils Absolute: 0.3 10*3/uL (ref 0.0–0.7)
Eosinophils Relative: 4 % (ref 0–5)
HCT: 45.6 % (ref 39.0–52.0)
Hemoglobin: 15.6 g/dL (ref 13.0–17.0)
Lymphocytes Relative: 44 % (ref 12–46)
Lymphs Abs: 3 10*3/uL (ref 0.7–4.0)
MCH: 27.7 pg (ref 26.0–34.0)
MCV: 81 fL (ref 78.0–100.0)
Monocytes Relative: 4 % (ref 3–12)
RBC: 5.63 MIL/uL (ref 4.22–5.81)
WBC: 6.7 10*3/uL (ref 4.0–10.5)

## 2013-01-31 LAB — COMPREHENSIVE METABOLIC PANEL
ALT: 20 U/L (ref 0–53)
AST: 38 U/L — ABNORMAL HIGH (ref 0–37)
CO2: 26 mEq/L (ref 19–32)
Calcium: 9.8 mg/dL (ref 8.4–10.5)
Chloride: 104 mEq/L (ref 96–112)
Creatinine, Ser: 1.03 mg/dL (ref 0.50–1.35)
GFR calc Af Amer: >90 mL/min (ref >90)
GFR calc non Af Amer: 79 mL/min — ABNORMAL LOW (ref >90)
Glucose, Bld: 115 mg/dL — ABNORMAL HIGH (ref 70–99)
Total Bilirubin: 0.3 mg/dL (ref 0.3–1.2)

## 2013-01-31 LAB — D-DIMER, QUANTITATIVE: D-Dimer, Quant: 0.38 ug/mL-FEU (ref 0.00–0.48)

## 2013-01-31 MED ORDER — SODIUM CHLORIDE 0.9 % IV SOLN
INTRAVENOUS | Status: DC
  Administered 2013-01-31 – 2013-02-02 (×3): via INTRAVENOUS

## 2013-01-31 MED ORDER — LORAZEPAM 1 MG PO TABS
1.0000 mg | ORAL_TABLET | ORAL | Status: DC | PRN
  Administered 2013-01-31 – 2013-02-01 (×2): 1 mg via ORAL
  Filled 2013-01-31 (×2): qty 1

## 2013-01-31 MED ORDER — LISINOPRIL 5 MG PO TABS
2.5000 mg | ORAL_TABLET | Freq: Every day | ORAL | Status: DC
  Administered 2013-02-01 – 2013-02-02 (×2): 2.5 mg via ORAL
  Filled 2013-01-31 (×2): qty 1

## 2013-01-31 MED ORDER — GLIPIZIDE ER 10 MG PO TB24
10.0000 mg | ORAL_TABLET | Freq: Every day | ORAL | Status: DC
Start: 2013-02-01 — End: 2013-02-02
  Administered 2013-02-01 – 2013-02-02 (×2): 10 mg via ORAL
  Filled 2013-01-31 (×4): qty 1

## 2013-01-31 MED ORDER — TICAGRELOR 90 MG PO TABS
90.0000 mg | ORAL_TABLET | Freq: Two times a day (BID) | ORAL | Status: DC
  Administered 2013-02-01 – 2013-02-02 (×3): 90 mg via ORAL
  Filled 2013-01-31 (×7): qty 1

## 2013-01-31 MED ORDER — ACETAMINOPHEN 325 MG PO TABS: 650.0000 mg | ORAL_TABLET | ORAL | Status: DC | PRN

## 2013-01-31 MED ORDER — SIMVASTATIN 20 MG PO TABS
40.0000 mg | ORAL_TABLET | Freq: Every evening | ORAL | Status: DC
  Administered 2013-02-01: 40 mg via ORAL
  Filled 2013-01-31: qty 2
  Filled 2013-01-31: qty 1

## 2013-01-31 MED ORDER — ISOSORBIDE MONONITRATE ER 60 MG PO TB24
30.0000 mg | ORAL_TABLET | Freq: Every day | ORAL | Status: DC
  Administered 2013-02-01 – 2013-02-02 (×2): 30 mg via ORAL
  Filled 2013-01-31 (×4): qty 1

## 2013-01-31 MED ORDER — HYDROCODONE-ACETAMINOPHEN 10-325 MG PO TABS
1.0000 | ORAL_TABLET | ORAL | Status: DC | PRN
  Administered 2013-01-31 – 2013-02-02 (×7): 1 via ORAL
  Filled 2013-01-31 (×7): qty 1

## 2013-01-31 MED ORDER — METOPROLOL TARTRATE 25 MG PO TABS
12.5000 mg | ORAL_TABLET | Freq: Two times a day (BID) | ORAL | Status: DC
  Administered 2013-01-31 – 2013-02-02 (×4): 12.5 mg via ORAL
  Filled 2013-01-31 (×4): qty 1

## 2013-01-31 MED ORDER — SODIUM CHLORIDE 0.9 % IV SOLN
INTRAVENOUS | Status: DC
  Administered 2013-01-31: 20:00:00 via INTRAVENOUS

## 2013-01-31 MED ORDER — ASPIRIN EC 81 MG PO TBEC
81.0000 mg | DELAYED_RELEASE_TABLET | Freq: Every day | ORAL | Status: DC
  Administered 2013-02-01 – 2013-02-02 (×2): 81 mg via ORAL
  Filled 2013-01-31 (×4): qty 1

## 2013-01-31 MED ORDER — ASPIRIN 81 MG PO CHEW
324.0000 mg | CHEWABLE_TABLET | Freq: Once | ORAL | Status: AC
  Administered 2013-01-31: 324 mg via ORAL
  Filled 2013-01-31: qty 4

## 2013-01-31 MED ORDER — NITROGLYCERIN 0.4 MG SL SUBL: 0.4000 mg | SUBLINGUAL_TABLET | SUBLINGUAL | Status: DC | PRN

## 2013-01-31 MED ORDER — FENTANYL CITRATE 0.05 MG/ML IJ SOLN
50.0000 ug | INTRAMUSCULAR | Status: DC | PRN
  Administered 2013-01-31: 50 ug via INTRAVENOUS
  Filled 2013-01-31: qty 2

## 2013-01-31 MED ORDER — PANTOPRAZOLE SODIUM 40 MG IV SOLR
40.0000 mg | INTRAVENOUS | Status: DC
  Administered 2013-01-31: 40 mg via INTRAVENOUS
  Filled 2013-01-31: qty 40
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
## 2013-01-31 NOTE — ED Notes (Signed)
Pt c/o pain on his right side.

## 2013-02-01 LAB — GLUCOSE, CAPILLARY: Glucose-Capillary: 83 mg/dL (ref 70–99)

## 2013-02-01 LAB — TROPONIN I: Troponin I: <0.3 ng/mL (ref <0.30)

## 2013-02-01 MED ORDER — SODIUM CHLORIDE 0.9 % IV SOLN: INTRAVENOUS | Status: AC

## 2013-02-01 MED ORDER — ONDANSETRON HCL 4 MG/2ML IJ SOLN: 4.0000 mg | Freq: Three times a day (TID) | INTRAMUSCULAR | Status: AC | PRN

## 2013-02-01 MED ORDER — PANTOPRAZOLE SODIUM 40 MG PO TBEC
40.0000 mg | DELAYED_RELEASE_TABLET | Freq: Every day | ORAL | Status: DC
  Administered 2013-02-01: 40 mg via ORAL
  Filled 2013-02-01: qty 1

## 2013-02-01 MED ORDER — ENOXAPARIN SODIUM 40 MG/0.4ML ~~LOC~~ SOLN
40.0000 mg | SUBCUTANEOUS | Status: DC
  Administered 2013-02-01 – 2013-02-02 (×2): 40 mg via SUBCUTANEOUS
  Filled 2013-02-01 (×3): qty 0.4

## 2013-02-01 NOTE — Progress Notes (Signed)
The patient is receiving Protonix by the intravenous route.  Based on criteria approved by the Pharmacy and Therapeutics Committee and the Medical Executive Committee, the medication is being converted to the equivalent oral dose form.  These criteria include: -No Active GI bleeding -Able to tolerate diet of full liquids (or better) or tube feeding OR able to tolerate other medications by the oral or enteral route  If you have any questions about this conversion, please contact the Pharmacy Department (ext [phone]).  Thank you.  Mady Gemma, [facility] 02/01/2013 9:11 AM

## 2013-02-01 NOTE — Progress Notes (Signed)
Pharmacy Consult: Lovenox for VTE prophylaxis.  Patient Measurements: Height: 5' 6.5" (168.9 cm) Weight: 176 lb 14.4 oz (80.241 kg) IBW/kg (Calculated) : 64.95 Body mass index is 28.13 kg/(m^2).   VITALS Filed Vitals:   02/01/13 0428  BP: 118/83  Pulse: 78  Temp: 98 F (36.7 C)  Resp: 20    INR Last Three Days: No results found for this basename: INR,  in the last 72 hours  CBC:    Component Value Date/Time   WBC 6.7 01/31/2013 1925   RBC 5.63 01/31/2013 1925   HGB 15.6 01/31/2013 1925   HCT 45.6 01/31/2013 1925   PLT 212 01/31/2013 1925   MCV 81.0 01/31/2013 1925   MCH 27.7 01/31/2013 1925   MCHC 34.2 01/31/2013 1925   RDW 16.6* 01/31/2013 1925   LYMPHSABS 3.0 01/31/2013 1925   MONOABS 0.3 01/31/2013 1925   EOSABS 0.3 01/31/2013 1925   BASOSABS 0.0 01/31/2013 1925    RENAL FUNCTION: Estimated Creatinine Clearance: 80.5 ml/min (by C-G formula based on Cr of 1.03).  Assessment: Dose stable for age, weight, renal function and indication.  Plan: Sign off.  Mady Gemma, [facility] 02/01/2013 9:49 AM

## 2013-02-01 NOTE — Progress Notes (Signed)
Subjective: Patient yesterday due to alcohol intoxication and back and chest pain. He has rt sided chest pain.  Objective: Vital signs in last 24 hours: Temp:  [98 F (36.7 C)-98.4 F (36.9 C)] 98 F (36.7 C) (04/06 0428) Pulse Rate:  [78-100] 78 (04/06 0428) Resp:  [18-20] 20 (04/06 0428) BP: (100-127)/(68-88) 118/83 mmHg (04/06 0428) SpO2:  [93 %-98 %] 93 % (04/06 0428) Weight:  [80.241 kg (176 lb 14.4 oz)-82.555 kg (182 lb)] 80.241 kg (176 lb 14.4 oz) (04/05 2257) Weight change:  Last BM Date: 01/31/13  Intake/Output from previous day:    PHYSICAL EXAM General appearance: alert Resp: clear to auscultation bilaterally Cardio: S1, S2 normal GI: soft, non-tender; bowel sounds normal; no masses,  no organomegaly Extremities: extremities normal, atraumatic, no cyanosis or edema  Lab Results:    @labtest @ [facility] No results found for this basename: PHART, PCO2, PO2ART, TCO2, HCO3,  in the last 72 hours CULTURES No results found for this or any previous visit (from the past 240 hour(s)). Studies/Results: Dg Ribs Unilateral W/chest Right  01/31/2013  *RADIOLOGY REPORT*  Clinical Data: Pain on right side of the chest.  No known injury.  RIGHT RIBS AND CHEST - 3+ VIEW  Comparison: 11/27/2012  Findings: Heart size is normal.  There are no focal consolidations or pleural effusions.  Oblique views demonstrate no acute rib fractures.  Degenerative changes are seen in the spine.  IMPRESSION:  1. No evidence for acute cardiopulmonary abnormality. 2.  No evidence for acute rib fracture.   Original Report Authenticated By: Norva Pavlov, M.D.     Medications: I have reviewed the patient's current medications.  Assesment: 1. Rt side Chest pain probably musculoskeletal cause 2. Coronary artery disease.  3. Hypertension.  4. Ethyl alcoholism, currently inebriated.  5. Cervical degenerative disk disease.  6. Diabetes.  7. Hyperlipidemia.  Active Problems:   * No active hospital  problems. *    Plan: Continue pain management Current treatment    LOS: 1 day   Aiyana Stegmann 02/01/2013, 9:31 AM

## 2013-02-01 NOTE — H&P (Signed)
[name], [name]           ACCOUNT NO.:  [id number]  MEDICAL RECORD NO.:  [id number]  LOCATION:  A305                          FACILITY:  [facility]  PHYSICIAN:  Mila Homer. Sudie Bailey, M.D.DATE OF BIRTH:  [birth year]  DATE OF ADMISSION:  01/31/2013 DATE OF DISCHARGE:  LH                             HISTORY & PHYSICAL   This 57 year old presented to the emergency room tonight with pain in the right chest.  He also had been drinking heavily.  He was quite sleepy at time of my exam, so I did not get much of a history from him.  The electronic medical record noted the following problems:  Type 2 diabetes, benign essential hypertension, coronary artery disease, hyperlipidemia, tobacco use disorder, long-term use of high risk medication.  Apparently, he had a STEMI in May, 2013 and had a stent placed.  He had a non-STEM MI in January, 2014 with total occlusion of the LCX, but this was not able to be opened with PCI.  CURRENT MEDICATIONS: 1. Acetaminophen. 2. Aspirin. 3. Glipizide. 4. Isosorbide. 5. Lisinopril. 6. Metoprolol. 7. P.r.n. nitroglycerin. 8. Simvastatin. 9. Valenti.  ADMISSION EXAM:  GENERAL/VITAL SIGNS:  A very sleepy middle-aged man whose temperature is 98.4, pulse 87, respiratory rate 18, blood pressure is 100/68. HEART:  Seemed to have a regular rhythm, rate of 70. LUNGS:  His lungs showed some mild expiratory wheezing in the right anterior chest. ABDOMEN:  Soft without organomegaly or mass or tenderness.  He had no edema of his ankles.  Chest x-ray with rib detail was negative.  His CBC was normal.  CMP showed a glucose of 115, AST of 38.  His ethyl alcohol level was 253.  EKG showed what appeared to be an old septal infarct.  His troponin was less than 0.30 on admission, however.  ADMISSION DIAGNOSES: 1. Chest pain. 2. Coronary artery disease. 3. Hypertension. 4. Ethyl alcoholism, currently inebriated. 5. Cervical degenerative disk disease. 6.  Diabetes. 7. Hyperlipidemia. 8. Chronic pain in the arms, back, and leg. Admitted to the hospital.  We will check another troponin in 6 hours and 12 hours.  He is currently on IV fluids.  I will continue his current meds.  His LMD, Dr. Felecia Shelling, will see him in the morning.  This patient at this point is an observation patient.     Mila Homer. Sudie Bailey, M.D.     SDK/MEDQ  D:  01/31/2013  T:  02/01/2013  Job:  [id number]

## 2013-02-01 NOTE — Progress Notes (Signed)
Utilization Review Completed.   Marlana Mckowen, RN, BSN Nurse Case Manager  [phone]  

## 2013-02-02 LAB — BASIC METABOLIC PANEL
BUN: 11 mg/dL (ref 6–23)
Chloride: 104 mEq/L (ref 96–112)
GFR calc non Af Amer: 79 mL/min — ABNORMAL LOW (ref >90)
Glucose, Bld: 101 mg/dL — ABNORMAL HIGH (ref 70–99)
Potassium: 4.1 mEq/L (ref 3.5–5.1)
Sodium: 140 mEq/L (ref 135–145)

## 2013-02-02 LAB — GLUCOSE, CAPILLARY: Glucose-Capillary: 116 mg/dL — ABNORMAL HIGH (ref 70–99)

## 2013-02-02 MED ORDER — NAPROXEN 500 MG PO TABS: 500.0000 mg | ORAL_TABLET | Freq: Two times a day (BID) | ORAL | Status: DC

## 2013-02-02 NOTE — Progress Notes (Signed)
Pt. States understanding of medications and discharge instructions.

## 2013-02-02 NOTE — Progress Notes (Signed)
Subjective: Patient continue to complain of rt side chest and back pain. No nausea or vomiting.  Objective: Vital signs in last 24 hours: Temp:  [97.7 F (36.5 C)-98.1 F (36.7 C)] 97.7 F (36.5 C) (04/07 0503) Pulse Rate:  [63-80] 63 (04/07 0503) Resp:  [18-20] 18 (04/07 0503) BP: (116-136)/(80-83) 136/83 mmHg (04/07 0503) SpO2:  [96 %-97 %] 97 % (04/07 0503) Weight change:  Last BM Date: 01/31/13  Intake/Output from previous day: 04/06 0701 - 04/07 0700 In: 680 [P.O.:680] Out: 800 [Urine:800]  PHYSICAL EXAM General appearance: alert Resp: clear to auscultation bilaterally Cardio: S1, S2 normal GI: soft, non-tender; bowel sounds normal; no masses,  no organomegaly Extremities: extremities normal, atraumatic, no cyanosis or edema  Lab Results:    @labtest @ [facility] No results found for this basename: PHART, PCO2, PO2ART, TCO2, HCO3,  in the last 72 hours CULTURES No results found for this or any previous visit (from the past 240 hour(s)). Studies/Results: Dg Ribs Unilateral W/chest Right  01/31/2013  *RADIOLOGY REPORT*  Clinical Data: Pain on right side of the chest.  No known injury.  RIGHT RIBS AND CHEST - 3+ VIEW  Comparison: 11/27/2012  Findings: Heart size is normal.  There are no focal consolidations or pleural effusions.  Oblique views demonstrate no acute rib fractures.  Degenerative changes are seen in the spine.  IMPRESSION:  1. No evidence for acute cardiopulmonary abnormality. 2.  No evidence for acute rib fracture.   Original Report Authenticated By: Norva Pavlov, M.D.     Medications: I have reviewed the patient's current medications.  Assesment: 1. Rt side Chest pain probably musculoskeletal cause 2. Coronary artery disease.  3. Hypertension.  4. Ethyl alcoholism, currently inebriated.  5. Cervical degenerative disk disease.  6. Diabetes.  7. Hyperlipidemia.  Active Problems:   * No active hospital problems. *    Plan: Continue pain  management Current treatment    LOS: 2 days   Emmalou Hunger 02/02/2013, 7:14 AM
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
## 2013-02-02 NOTE — Discharge Summary (Signed)
Physician Discharge Summary  Patient ID: Daniel Li MRN: [id number] DOB/AGE: 03/16/1956 57 y.o. Primary Care Physician:Jameya Pontiff, MD Admit date: 01/31/2013 Discharge date: 02/02/2013    Discharge Diagnoses:  1. Rt side Chest pain probably musculoskeletal cause  2. Coronary artery disease.  3. Hypertension.  4. Ethyl alcoholism, currently inebriated.  5. Cervical degenerative disk disease.  6. Diabetes.  7. Hyperlipidem   Active Problems:   * No active hospital problems. *     Medication List    TAKE these medications       acetaminophen 325 MG tablet  Commonly known as:  TYLENOL  Take 2 tablets (650 mg total) by mouth every 4 (four) hours as needed for pain.     aspirin 81 MG EC tablet  Take 1 tablet (81 mg total) by mouth daily.     glipiZIDE 10 MG 24 hr tablet  Commonly known as:  GLUCOTROL XL  Take 1 tablet (10 mg total) by mouth daily.     isosorbide mononitrate 30 MG 24 hr tablet  Commonly known as:  IMDUR  Take 1 tablet (30 mg total) by mouth daily.     lisinopril 2.5 MG tablet  Commonly known as:  PRINIVIL,ZESTRIL  Take 1 tablet (2.5 mg total) by mouth daily.     metoprolol tartrate 25 MG tablet  Commonly known as:  LOPRESSOR  Take 0.5 tablets (12.5 mg total) by mouth 2 (two) times daily.     naproxen 500 MG tablet  Commonly known as:  NAPROSYN  Take 1 tablet (500 mg total) by mouth 2 (two) times daily with a meal.     nitroGLYCERIN 0.4 MG SL tablet  Commonly known as:  NITROSTAT  Place 1 tablet (0.4 mg total) under the tongue every 5 (five) minutes as needed for chest pain (up to 3 doses).     polyethylene glycol-electrolytes 420 G solution  Commonly known as:  TRILYTE  Take 4,000 mLs by mouth as directed.     simvastatin 40 MG tablet  Commonly known as:  ZOCOR  Take 1 tablet (40 mg total) by mouth every evening.     Ticagrelor 90 MG Tabs tablet  Commonly known as:  BRILINTA  Take 1 tablet (90 mg total) by mouth 2 (two) times  daily.        Discharged Condition: improved    Consults: none  Significant Diagnostic Studies: Dg Ribs Unilateral W/chest Right  01/31/2013  *RADIOLOGY REPORT*  Clinical Data: Pain on right side of the chest.  No known injury.  RIGHT RIBS AND CHEST - 3+ VIEW  Comparison: 11/27/2012  Findings: Heart size is normal.  There are no focal consolidations or pleural effusions.  Oblique views demonstrate no acute rib fractures.  Degenerative changes are seen in the spine.  IMPRESSION:  1. No evidence for acute cardiopulmonary abnormality. 2.  No evidence for acute rib fracture.   Original Report Authenticated By: Norva Pavlov, M.D.     Lab Results: Basic Metabolic Panel:  Recent Labs  40/98/11 1925 02/02/13 0544  NA 142 140  K 3.8 4.1  CL 104 104  CO2 26 27  GLUCOSE 115* 101*  BUN 11 11  CREATININE 1.03 1.03  CALCIUM 9.8 9.0   Liver Function Tests:  Recent Labs  01/31/13 1925  AST 38*  ALT 20  ALKPHOS 71  BILITOT 0.3  PROT 7.5  ALBUMIN 3.9     CBC:  Recent Labs  01/31/13 1925  WBC 6.7  NEUTROABS 3.2  HGB 15.6  HCT 45.6  MCV 81.0  PLT 212    No results found for this or any previous visit (from the past 240 hour(s)).   Hospital Course:  This is a 57 years old male patient with history of multiple medical illnesses was admitted due alcohol intoxication and rt side chest/back pain. Patient was admitted under telemetry and was monitored. He received pain medications improved.  Discharge Exam: Blood pressure 129/88, pulse 76, temperature 97.9 F (36.6 C), temperature source Oral, resp. rate 18, height 5' 6.5" (1.689 m), weight 80.241 kg (176 lb 14.4 oz), SpO2 96.00%.   Disposition:  Home         Follow-up Information   Follow up with [facility], [place] In 1 week.   Contact information:   [address] [place] [address] [phone]       Signed: Avon Gully   02/02/2013, 2:40 PM

## 2013-02-02 NOTE — Care Management Note (Signed)
    Page 1 of 1   02/02/2013     3:04:57 PM   CARE MANAGEMENT NOTE 02/02/2013  Patient:  Daniel Li, Daniel Li   Account Number:  [id number]  Date Initiated:  02/02/2013  Documentation initiated by:  Sharrie Rothman  Subjective/Objective Assessment:   Pt admitted from home with CP and etoh abuse. Pt lives with his niece and will return home at discharge. Pt admits to drinking beer prior to admission. Pt is independent with ADL's.     Action/Plan:   No CM needs noted.   Anticipated DC Date:  02/03/2013   Anticipated DC Plan:  HOME/SELF CARE      DC Planning Services  CM consult      Choice offered to / List presented to:             Status of service:  Completed, signed off Medicare Important Message given?   (If response is "NO", the following Medicare IM given date fields will be blank) Date Medicare IM given:   Date Additional Medicare IM given:    Discharge Disposition:  HOME/SELF CARE  Per UR Regulation:    If discussed at Long Length of Stay Meetings, dates discussed:    Comments:  02/02/13 1505 Arlyss Queen, RN BSN CM Pt discharged home today. Pt will be followed by [facility]. No other CM needs noted.  02/02/13 0930 Arlyss Queen, RN BSN CM

## 2013-02-03 NOTE — Progress Notes (Signed)
Daniel Gross, NP ','<More Detail >>       Message    Yes, he can have colonoscopy in May. Can stop Brilinta for 3 days if you need to but re-start ASAP afterwards. He has multiple stents, (RCA and LAD) and therefore will need to stay on Brilinta for life.      ----- Message -----   From: Tiffany Kocher, PA-C   Sent: 01/29/2013 4:04 PM   To: Daniel Gross, NP, Tiffany Kocher, PA-C      Can you look at this one again? Looks like 10/2012 no stent. 02/2012 stent. D/C 02/2012 had Brilinta. Is this guy doable for colonoscopy in May of this year or do we need to wait until 10/2013?

## 2013-02-03 NOTE — Progress Notes (Signed)
Discussed case further with Joni Reining, NP. Patient started on Brilinta in 02/2012 at time of last stent. He has multiple stents and will be on Brilinta for life.  Cancel colonoscopy for now. He cannot come off Brilinta until 03/2013.  Make OV for latter part of May to schedule his colonoscopy off Brilinta.

## 2013-02-04 NOTE — Progress Notes (Signed)
I spoke to [name] power of attorney Elease Hashimoto and she is aware of TCS being cancelled and OPV in May

## 2013-02-20 ENCOUNTER — Ambulatory Visit (HOSPITAL_COMMUNITY): Admission: RE | Admit: 2013-02-20 | Payer: Medicaid Other | Source: Ambulatory Visit | Admitting: Internal Medicine

## 2013-02-20 ENCOUNTER — Encounter (HOSPITAL_COMMUNITY): Admission: RE | Payer: Self-pay | Source: Ambulatory Visit

## 2013-02-20 SURGERY — COLONOSCOPY
Anesthesia: Moderate Sedation

## 2013-03-24 ENCOUNTER — Encounter: Payer: Self-pay | Admitting: Gastroenterology

## 2013-03-24 ENCOUNTER — Ambulatory Visit (INDEPENDENT_AMBULATORY_CARE_PROVIDER_SITE_OTHER): Payer: Medicaid Other | Admitting: Gastroenterology

## 2013-03-24 VITALS — BP 130/96 | HR 82 | Temp 98.2°F | Ht 66.5 in | Wt 183.6 lb

## 2013-03-24 DIAGNOSIS — Z1211 Encounter for screening for malignant neoplasm of colon: Secondary | ICD-10-CM

## 2013-03-24 NOTE — Patient Instructions (Addendum)
We've scheduled you for colonoscopy with Dr. Rourk. Please see separate instructions. 

## 2013-03-24 NOTE — Progress Notes (Signed)
Cc PCP 

## 2013-03-24 NOTE — Assessment & Plan Note (Signed)
Colonoscopy in the near future. Per previous recommendations by his cardiologist, Joni Reining NP, we will hold his Brilinta for 3 days before procedure and start back asap. He has h/o chronic heavy etoh abuse. Will plan on deep sedation in the OR given this and because we want to ensure adequate sedation given we are stopping his Brilinta. Patient is agreeable with this approach.  I have discussed the risks, alternatives, benefits with regards to but not limited to the risk of reaction to medication, bleeding, infection, perforation and the patient is agreeable to proceed. Written consent to be obtained.  Dr. [name] to provide further instructions regarding starting back Brilinta after procedure .
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
## 2013-04-02 ENCOUNTER — Encounter (HOSPITAL_COMMUNITY): Payer: Self-pay | Admitting: Pharmacy Technician

## 2013-04-08 NOTE — Patient Instructions (Addendum)
Daniel Li  04/08/2013   Your procedure is scheduled on:  04/16/2013  Report to [facility] at  615 AM.  Call this number if you have problems the morning of surgery: [phone]   Remember:   Do not eat food or drink liquids after midnight.   Take these medicines the morning of surgery with A SIP OF WATER: neurontin, lisinopril, metoprolol,    Hold Brilainta 3 days prior to procedure.  Do not wear jewelry, make-up or nail polish.  Do not wear lotions, powders, or perfumes.   Do not shave 48 hours prior to surgery. Men may shave face and neck.  Do not bring valuables to the hospital.  [facility] is not responsible  for any belongings or valuables.  Contacts, dentures or bridgework may not be worn into surgery.  Leave suitcase in the car. After surgery it may be brought to your room.  For patients admitted to the hospital, checkout time is 11:00 AM the day of discharge.   Patients discharged the day of surgery will not be allowed to drive  home.  Name and phone number of your driver: family  Special Instructions: N/A   Please read over the following fact sheets that you were given: Pain Booklet, Coughing and Deep Breathing, MRSA Information, Surgical Site Infection Prevention, Anesthesia Post-op Instructions and Care and Recovery After Surgery Colonoscopy A colonoscopy is an exam to evaluate your entire colon. In this exam, your colon is cleansed. A long fiberoptic tube is inserted through your rectum and into your colon. The fiberoptic scope (endoscope) is a long bundle of enclosed and very flexible fibers. These fibers transmit light to the area examined and send images from that area to your caregiver. Discomfort is usually minimal. You may be given a drug to help you sleep (sedative) during or prior to the procedure. This exam helps to detect lumps (tumors), polyps, inflammation, and areas of bleeding. Your caregiver may also take a small piece of tissue (biopsy) that will  be examined under a microscope. LET YOUR CAREGIVER KNOW ABOUT:   Allergies to food or medicine.  Medicines taken, including vitamins, herbs, eyedrops, over-the-counter medicines, and creams.  Use of steroids (by mouth or creams).  Previous problems with anesthetics or numbing medicines.  History of bleeding problems or blood clots.  Previous surgery.  Other health problems, including diabetes and kidney problems.  Possibility of pregnancy, if this applies. BEFORE THE PROCEDURE   A clear liquid diet may be required for 2 days before the exam.  Ask your caregiver about changing or stopping your regular medications.  Liquid injections (enemas) or laxatives may be required.  A large amount of electrolyte solution may be given to you to drink over a short period of time. This solution is used to clean out your colon.  You should be present 60 minutes prior to your procedure or as directed by your caregiver. AFTER THE PROCEDURE   If you received a sedative or pain relieving medication, you will need to arrange for someone to drive you home.  Occasionally, there is a little blood passed with the first bowel movement. Do not be concerned. FINDING OUT THE RESULTS OF YOUR TEST Not all test results are available during your visit. If your test results are not back during the visit, make an appointment with your caregiver to find out the results. Do not assume everything is normal if you have not heard from your caregiver or the medical  facility. It is important for you to follow up on all of your test results. HOME CARE INSTRUCTIONS   It is not unusual to pass moderate amounts of gas and experience mild abdominal cramping following the procedure. This is due to air being used to inflate your colon during the exam. Walking or a warm pack on your belly (abdomen) may help.  You may resume all normal meals and activities after sedatives and medicines have worn off.  Only take  over-the-counter or prescription medicines for pain, discomfort, or fever as directed by your caregiver. Do not use aspirin or blood thinners if a biopsy was taken. Consult your caregiver for medicine usage if biopsies were taken. SEEK IMMEDIATE MEDICAL CARE IF:   You have a fever.  You pass large blood clots or fill a toilet with blood following the procedure. This may also occur 10 to 14 days following the procedure. This is more likely if a biopsy was taken.  You develop abdominal pain that keeps getting worse and cannot be relieved with medicine. Document Released: 10/12/2000 Document Revised: 01/07/2012 Document Reviewed: 05/27/2008 [facility] Patient Information 2014 [place], [place]. PATIENT INSTRUCTIONS POST-ANESTHESIA  IMMEDIATELY FOLLOWING SURGERY:  Do not drive or operate machinery for the first twenty four hours after surgery.  Do not make any important decisions for twenty four hours after surgery or while taking narcotic pain medications or sedatives.  If you develop intractable nausea and vomiting or a severe headache please notify your doctor immediately.  FOLLOW-UP:  Please make an appointment with your surgeon as instructed. You do not need to follow up with anesthesia unless specifically instructed to do so.  WOUND CARE INSTRUCTIONS (if applicable):  Keep a dry clean dressing on the anesthesia/puncture wound site if there is drainage.  Once the wound has quit draining you may leave it open to air.  Generally you should leave the bandage intact for twenty four hours unless there is drainage.  If the epidural site drains for more than 36-48 hours please call the anesthesia department.  QUESTIONS?:  Please feel free to call your physician or the hospital operator if you have any questions, and they will be happy to assist you.

## 2013-04-09 ENCOUNTER — Encounter (HOSPITAL_COMMUNITY)
Admission: RE | Admit: 2013-04-09 | Discharge: 2013-04-09 | Disposition: A | Discharge status: Home / Self Care | Payer: Medicaid Other | Source: Ambulatory Visit | Attending: Internal Medicine | Admitting: Internal Medicine

## 2013-04-09 ENCOUNTER — Encounter (HOSPITAL_COMMUNITY): Payer: Self-pay

## 2013-04-09 ENCOUNTER — Encounter (HOSPITAL_COMMUNITY): Payer: Self-pay | Admitting: Pharmacy Technician

## 2013-04-09 HISTORY — DX: Acute myocardial infarction, unspecified: I21.9

## 2013-04-09 HISTORY — DX: Depression, unspecified: F32.A

## 2013-04-09 HISTORY — DX: Major depressive disorder, single episode, unspecified: F32.9

## 2013-04-09 HISTORY — DX: Anxiety disorder, unspecified: F41.9

## 2013-04-09 HISTORY — DX: Mental disorder, not otherwise specified: F99

## 2013-04-09 LAB — CBC
Hemoglobin: 15.2 g/dL (ref 13.0–17.0)
MCH: 27.9 pg (ref 26.0–34.0)
MCHC: 33.9 g/dL (ref 30.0–36.0)
MCV: 82.4 fL (ref 78.0–100.0)
Platelets: 203 10*3/uL (ref 150–400)
RBC: 5.45 MIL/uL (ref 4.22–5.81)

## 2013-04-09 LAB — BASIC METABOLIC PANEL
CO2: 32 mEq/L (ref 19–32)
Calcium: 9.9 mg/dL (ref 8.4–10.5)
Creatinine, Ser: 1.01 mg/dL (ref 0.50–1.35)
GFR calc non Af Amer: 81 mL/min — ABNORMAL LOW (ref >90)
Glucose, Bld: 153 mg/dL — ABNORMAL HIGH (ref 70–99)

## 2013-04-16 ENCOUNTER — Encounter (HOSPITAL_COMMUNITY): Payer: Self-pay | Admitting: Anesthesiology

## 2013-04-16 ENCOUNTER — Ambulatory Visit (HOSPITAL_COMMUNITY)
Admission: RE | Admit: 2013-04-16 | Discharge: 2013-04-16 | Disposition: A | Discharge status: Home / Self Care | Payer: Medicaid Other | Source: Ambulatory Visit | Attending: Internal Medicine | Admitting: Internal Medicine

## 2013-04-16 ENCOUNTER — Ambulatory Visit (HOSPITAL_COMMUNITY): Payer: Medicaid Other | Admitting: Anesthesiology

## 2013-04-16 ENCOUNTER — Encounter (HOSPITAL_COMMUNITY)
Admission: RE | Disposition: A | Discharge status: Home / Self Care | Payer: Self-pay | Source: Ambulatory Visit | Attending: Internal Medicine

## 2013-04-16 ENCOUNTER — Encounter (HOSPITAL_COMMUNITY): Payer: Self-pay | Admitting: *Deleted

## 2013-04-16 DIAGNOSIS — E119 Type 2 diabetes mellitus without complications: Secondary | ICD-10-CM | POA: Insufficient documentation

## 2013-04-16 DIAGNOSIS — Z01812 Encounter for preprocedural laboratory examination: Secondary | ICD-10-CM | POA: Insufficient documentation

## 2013-04-16 DIAGNOSIS — I10 Essential (primary) hypertension: Secondary | ICD-10-CM | POA: Insufficient documentation

## 2013-04-16 DIAGNOSIS — Z1211 Encounter for screening for malignant neoplasm of colon: Secondary | ICD-10-CM

## 2013-04-16 DIAGNOSIS — Z01810 Encounter for preprocedural cardiovascular examination: Secondary | ICD-10-CM | POA: Insufficient documentation

## 2013-04-16 DIAGNOSIS — D128 Benign neoplasm of rectum: Secondary | ICD-10-CM | POA: Insufficient documentation

## 2013-04-16 DIAGNOSIS — K620 Anal polyp: Secondary | ICD-10-CM

## 2013-04-16 DIAGNOSIS — K621 Rectal polyp: Secondary | ICD-10-CM

## 2013-04-16 HISTORY — PX: COLONOSCOPY WITH PROPOFOL: SHX5780

## 2013-04-16 HISTORY — PX: POLYPECTOMY: SHX5525

## 2013-04-16 HISTORY — PX: BIOPSY: SHX5522

## 2013-04-16 LAB — GLUCOSE, CAPILLARY: Glucose-Capillary: 93 mg/dL (ref 70–99)

## 2013-04-16 SURGERY — COLONOSCOPY WITH PROPOFOL
Anesthesia: Monitor Anesthesia Care | Site: Rectum

## 2013-04-16 MED ORDER — WATER FOR IRRIGATION, STERILE IR SOLN
Status: DC | PRN
  Administered 2013-04-16: 1000 mL

## 2013-04-16 MED ORDER — STERILE WATER FOR IRRIGATION IR SOLN
Status: DC | PRN
  Administered 2013-04-16: 08:00:00

## 2013-04-16 MED ORDER — ONDANSETRON HCL 4 MG/2ML IJ SOLN
4.0000 mg | Freq: Once | INTRAMUSCULAR | Status: AC
  Administered 2013-04-16: 4 mg via INTRAVENOUS

## 2013-04-16 MED ORDER — GLYCOPYRROLATE 0.2 MG/ML IJ SOLN
INTRAMUSCULAR | Status: AC
  Filled 2013-04-16: qty 1

## 2013-04-16 MED ORDER — FENTANYL CITRATE 0.05 MG/ML IJ SOLN
INTRAMUSCULAR | Status: AC
  Filled 2013-04-16: qty 2

## 2013-04-16 MED ORDER — LACTATED RINGERS IV SOLN
INTRAVENOUS | Status: DC
  Administered 2013-04-16: 1000 mL via INTRAVENOUS

## 2013-04-16 MED ORDER — GLYCOPYRROLATE 0.2 MG/ML IJ SOLN
0.2000 mg | Freq: Once | INTRAMUSCULAR | Status: AC
  Administered 2013-04-16: 0.2 mg via INTRAVENOUS

## 2013-04-16 MED ORDER — MIDAZOLAM HCL 2 MG/2ML IJ SOLN
INTRAMUSCULAR | Status: AC
  Filled 2013-04-16: qty 2

## 2013-04-16 MED ORDER — PROPOFOL INFUSION 10 MG/ML OPTIME
INTRAVENOUS | Status: DC | PRN
  Administered 2013-04-16: 100 ug/kg/min via INTRAVENOUS

## 2013-04-16 MED ORDER — FENTANYL CITRATE 0.05 MG/ML IJ SOLN
25.0000 ug | INTRAMUSCULAR | Status: DC | PRN
  Administered 2013-04-16 (×2): 25 ug via INTRAVENOUS

## 2013-04-16 MED ORDER — FENTANYL CITRATE 0.05 MG/ML IJ SOLN: 25.0000 ug | INTRAMUSCULAR | Status: DC | PRN

## 2013-04-16 MED ORDER — ONDANSETRON HCL 4 MG/2ML IJ SOLN
INTRAMUSCULAR | Status: AC
  Filled 2013-04-16: qty 2

## 2013-04-16 MED ORDER — MIDAZOLAM HCL 2 MG/2ML IJ SOLN
1.0000 mg | INTRAMUSCULAR | Status: DC | PRN
  Administered 2013-04-16 (×2): 2 mg via INTRAVENOUS

## 2013-04-16 MED ORDER — ONDANSETRON HCL 4 MG/2ML IJ SOLN: 4.0000 mg | Freq: Once | INTRAMUSCULAR | Status: DC | PRN

## 2013-04-16 SURGICAL SUPPLY — 21 items
ELECT REM PT RETURN 9FT ADLT (ELECTROSURGICAL)
ELECTRODE REM PT RTRN 9FT ADLT (ELECTROSURGICAL) IMPLANT
FCP BXJMBJMB 240X2.8X (CUTTING FORCEPS) ×2
FLOOR PAD 36X40 (MISCELLANEOUS) ×3
FORCEPS BIOP RAD 4 LRG CAP 4 (CUTTING FORCEPS) IMPLANT
FORCEPS BIOP RJ4 240 W/NDL (CUTTING FORCEPS) ×1
FORCEPS BXJMBJMB 240X2.8X (CUTTING FORCEPS) ×2 IMPLANT
INJECTOR/SNARE I SNARE (MISCELLANEOUS) IMPLANT
LUBRICANT JELLY 4.5OZ STERILE (MISCELLANEOUS) ×3 IMPLANT
MANIFOLD NEPTUNE II (INSTRUMENTS) ×3 IMPLANT
NEEDLE SCLEROTHERAPY 25GX240 (NEEDLE) ×3 IMPLANT
PAD FLOOR 36X40 (MISCELLANEOUS) ×2 IMPLANT
PROBE APC STR FIRE (PROBE) IMPLANT
PROBE INJECTION GOLD (MISCELLANEOUS)
PROBE INJECTION GOLD 7FR (MISCELLANEOUS) IMPLANT
SNARE ROTATE MED OVAL 20MM (MISCELLANEOUS) IMPLANT
SYR 50ML LL SCALE MARK (SYRINGE) ×3 IMPLANT
TRAP SPECIMEN MUCOUS 40CC (MISCELLANEOUS) ×3 IMPLANT
TUBING ENDO SMARTCAP PENTAX (MISCELLANEOUS) ×3 IMPLANT
TUBING IRRIGATION ENDOGATOR (MISCELLANEOUS) IMPLANT
WATER STERILE IRR 1000ML POUR (IV SOLUTION) ×6 IMPLANT

## 2013-04-16 NOTE — Transfer of Care (Signed)
Immediate Anesthesia Transfer of Care Note  Patient: Daniel Li  Procedure(s) Performed: Procedure(s) with comments: COLONOSCOPY WITH PROPOFOL (N/A) - in cecum at 0756, out at = total mins POLYPECTOMY (N/A)  Patient Location: PACU  Anesthesia Type:MAC  Level of Consciousness: awake, alert  and oriented  Airway & Oxygen Therapy: Patient Spontanous Breathing and Patient connected to face mask oxygen  Post-op Assessment: Report given to PACU RN  Post vital signs: Reviewed and stable  Complications: No apparent anesthesia complications
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
## 2013-04-16 NOTE — Anesthesia Preprocedure Evaluation (Signed)
Anesthesia Evaluation  Patient identified by MRN, date of birth, ID band Patient awake    Reviewed: Allergy & Precautions, H&P , NPO status , Patient's Chart, lab work & pertinent test results  Airway Mallampati: II TM Distance: >3 FB     Dental  (+) Teeth Intact and Partial Lower   Pulmonary  breath sounds clear to auscultation        Cardiovascular hypertension, Pt. on medications + CAD and + Past MI Rhythm:Regular Rate:Normal     Neuro/Psych PSYCHIATRIC DISORDERS Anxiety Depression  Neuromuscular disease CVA    GI/Hepatic GERD-  ,  Endo/Other  diabetes, Well Controlled, Type 2, Oral Hypoglycemic Agents  Renal/GU      Musculoskeletal  (+) Arthritis -,   Abdominal   Peds  Hematology   Anesthesia Other Findings   Reproductive/Obstetrics                           Anesthesia Physical Anesthesia Plan  ASA: III  Anesthesia Plan: MAC   Post-op Pain Management:    Induction: Intravenous  Airway Management Planned: Simple Face Mask  Additional Equipment:   Intra-op Plan:   Post-operative Plan:   Informed Consent: I have reviewed the patients History and Physical, chart, labs and discussed the procedure including the risks, benefits and alternatives for the proposed anesthesia with the patient or authorized representative who has indicated his/her understanding and acceptance.     Plan Discussed with:   Anesthesia Plan Comments:         Anesthesia Quick Evaluation

## 2013-04-16 NOTE — Interval H&P Note (Signed)
History and Physical Interval Note:  04/16/2013 7:30 AM  Daniel Li  has presented today for surgery, with the diagnosis of Screening TCS  The various methods of treatment have been discussed with the patient and family. After consideration of risks, benefits and other options for treatment, the patient has consented to  Procedure(s) with comments: COLONOSCOPY WITH PROPOFOL (N/A) - 7:30-high risk med, etoh abuse as a surgical intervention .  The patient's history has been reviewed, patient examined, no change in status, stable for surgery.  I have reviewed the patient's chart and labs.  Questions were answered to the patient's satisfaction.      High-risk screening colonoscopy-first-ever examination per plan.  The risks, benefits, limitations, alternatives and imponderables have been reviewed with the patient. Questions have been answered. All parties are agreeable.   [name]

## 2013-04-16 NOTE — Addendum Note (Signed)
Addendum created 04/16/13 0981 by Moshe Salisbury, CRNA   Modules edited: Anesthesia Events

## 2013-04-16 NOTE — Anesthesia Postprocedure Evaluation (Signed)
  Anesthesia Post-op Note  Patient: Daniel Li  Procedure(s) Performed: Procedure(s) with comments: COLONOSCOPY WITH PROPOFOL (N/A) - in cecum at 0756, out at = total mins POLYPECTOMY (N/A)  Patient Location: PACU  Anesthesia Type:MAC  Level of Consciousness: awake, alert  and oriented  Airway and Oxygen Therapy: Patient Spontanous Breathing and Patient connected to face mask oxygen  Post-op Pain: none  Post-op Assessment: Post-op Vital signs reviewed, Patient's Cardiovascular Status Stable, Respiratory Function Stable, Patent Airway and No signs of Nausea or vomiting  Post-op Vital Signs: Reviewed and stable, 37.1  Complications: No apparent anesthesia complications

## 2013-04-17 NOTE — Op Note (Signed)
[facility] [address] [place], [address]   COLONOSCOPY PROCEDURE REPORT  PATIENT: [name], [name]  MR#:         [id number] BIRTHDATE: [birth year] , 56  yrs. old GENDER: Male ENDOSCOPIST: R.  Roetta Sessions, MD FACP FACG REFERRED BY:  Glenice Laine, M.D.  Kathlen Brunswick, M.D. PROCEDURE DATE:  04/16/2013 PROCEDURE:     Colonoscopy with snare polypectomy and biopsy  INDICATIONS: First-ever high risk repeat colonoscopy-Brilinta stopped  3 days ago per cardiology  INFORMED CONSENT:  The risks, benefits, alternatives and imponderables including but not limited to bleeding, perforation as well as the possibility of a missed lesion have been reviewed.  The potential for biopsy, lesion removal, etc. have also been discussed.  Questions have been answered.  All parties agreeable. Please see the history and physical in the medical record for more information.  MEDICATIONS:   Deep sedation per Dr. [facility]  DESCRIPTION OF PROCEDURE:  After a digital rectal exam was performed, the     colonoscope was advanced from the anus through the rectum and colon to the area of the cecum, ileocecal valve and appendiceal orifice.  The cecum was deeply intubated.  These structures were well-seen and photographed for the record.  From the level of the cecum and ileocecal valve, the scope was slowly and cautiously withdrawn.  The mucosal surfaces were carefully surveyed utilizing scope tip deflection to facilitate fold flattening as needed.  The scope was pulled down into the rectum where a thorough examination including retroflexion was performed.     FINDINGS:  Adequate preparation. (1) 4 mm pedunculated polyp at 3 cm in from the anal verge; (1) diminutive polyp in at 5 cm from the anal verge; otherwise normal rectum. Somewhat tortuous but otherwise normal-appearing colon.  THERAPEUTIC / DIAGNOSTIC MANEUVERS PERFORMED:  The larger rectal polyp was hot  snared and recovered. The smaller polyp was cold biopsied/removed.  COMPLICATIONS: None  CECAL WITHDRAWAL TIME:  15 minutes  IMPRESSION:  Rectal polyps-removed as described above  RECOMMENDATIONS: Followup on pathology. Patient instructed to resume Brilinta on Saturday, June 21st   _______________________________ eSigned:  R. Roetta Sessions, MD FACP [facility] 04/16/2013 8:28 AM   CC:    PATIENT NAME:  Daniel, Li MR#: [id number]

## 2013-04-20 ENCOUNTER — Encounter (HOSPITAL_COMMUNITY): Payer: Self-pay | Admitting: Internal Medicine

## 2013-04-23 ENCOUNTER — Encounter: Payer: Self-pay | Admitting: Cardiology

## 2013-05-10 ENCOUNTER — Encounter: Payer: Self-pay | Admitting: Internal Medicine

## 2013-06-29 ENCOUNTER — Encounter: Payer: Self-pay | Admitting: Cardiology

## 2013-08-10 ENCOUNTER — Ambulatory Visit: Payer: Medicaid Other | Admitting: Adult Health

## 2013-08-11 ENCOUNTER — Encounter: Payer: Self-pay | Admitting: Cardiology

## 2013-08-11 ENCOUNTER — Ambulatory Visit (INDEPENDENT_AMBULATORY_CARE_PROVIDER_SITE_OTHER): Payer: Medicaid Other | Admitting: Cardiology

## 2013-08-11 VITALS — BP 137/96 | HR 80 | Ht 66.0 in | Wt 183.0 lb

## 2013-08-11 DIAGNOSIS — I25810 Atherosclerosis of coronary artery bypass graft(s) without angina pectoris: Secondary | ICD-10-CM

## 2013-08-11 MED ORDER — NICOTINE 7 MG/24HR TD PT24: 1.0000 | MEDICATED_PATCH | TRANSDERMAL | Status: DC

## 2013-08-11 MED ORDER — HYDROCHLOROTHIAZIDE 12.5 MG PO CAPS: 12.5000 mg | ORAL_CAPSULE | Freq: Every day | ORAL | Status: DC

## 2013-08-11 MED ORDER — LISINOPRIL 20 MG PO TABS: 20.0000 mg | ORAL_TABLET | Freq: Every day | ORAL | Status: DC

## 2013-08-11 MED ORDER — NICOTINE 21 MG/24HR TD PT24: 1.0000 | MEDICATED_PATCH | TRANSDERMAL | Status: DC

## 2013-08-11 MED ORDER — NICOTINE 14 MG/24HR TD PT24: 1.0000 | MEDICATED_PATCH | TRANSDERMAL | Status: DC
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
## 2013-08-11 NOTE — Patient Instructions (Signed)
Your physician recommends that you schedule a follow-up appointment in: 6 months with Dr. Wyline Mood. You should receive a letter in the mail in 4 months. If you do not receive this letter by February 2015 call our office to schedule this appointment.   Your physician has recommended you make the following change in your medication:  Stop: Lisinopril / HCTZ Stop: Simvastatin Start: Lisinopril 20 MG once daily Start: Hydrochlorothiazide 12.5 MG once daily. Start: Crestor 20 MG once daily Start: Nicotine patches As directed.  Continue all other medications the same.

## 2013-08-12 ENCOUNTER — Other Ambulatory Visit: Payer: Self-pay | Admitting: *Deleted

## 2013-08-12 MED ORDER — ROSUVASTATIN CALCIUM 20 MG PO TABS: 20.0000 mg | ORAL_TABLET | Freq: Every day | ORAL | Status: DC

## 2014-03-01 ENCOUNTER — Encounter: Payer: Self-pay | Admitting: Cardiology

## 2014-03-01 ENCOUNTER — Ambulatory Visit (INDEPENDENT_AMBULATORY_CARE_PROVIDER_SITE_OTHER): Payer: Medicaid Other | Admitting: Cardiology

## 2014-03-01 VITALS — BP 167/103 | HR 100 | Ht 66.0 in | Wt 192.0 lb

## 2014-03-01 DIAGNOSIS — E7800 Pure hypercholesterolemia, unspecified: Secondary | ICD-10-CM

## 2014-03-01 DIAGNOSIS — Z79899 Other long term (current) drug therapy: Secondary | ICD-10-CM

## 2014-03-01 DIAGNOSIS — I10 Essential (primary) hypertension: Secondary | ICD-10-CM

## 2014-03-01 DIAGNOSIS — I2510 Atherosclerotic heart disease of native coronary artery without angina pectoris: Secondary | ICD-10-CM

## 2014-03-01 MED ORDER — HYDROCHLOROTHIAZIDE 25 MG PO TABS: 25.0000 mg | ORAL_TABLET | Freq: Every day | ORAL | Status: DC
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
## 2014-03-01 NOTE — Patient Instructions (Signed)
   Stop Brilanta   Increase HCTZ to 25mg  daily - new sent to pharm Continue all other medications.   Lab for CMET, FLP - Reminder:  Nothing to eat or drink after 12 midnight prior to labs. Office will contact with results via phone or letter.   Nurse visit in 2 weeks for blood pressure check Your physician wants you to follow up in: 6 months.  You will receive a reminder letter in the mail one-two months in advance.  If you don't receive a letter, please call our office to schedule the follow up appointment

## 2014-03-16 ENCOUNTER — Ambulatory Visit (INDEPENDENT_AMBULATORY_CARE_PROVIDER_SITE_OTHER): Payer: Medicaid Other | Admitting: *Deleted

## 2014-03-16 VITALS — BP 144/99 | HR 88 | Ht 66.0 in | Wt 186.0 lb

## 2014-03-16 DIAGNOSIS — I10 Essential (primary) hypertension: Secondary | ICD-10-CM

## 2014-03-16 NOTE — Progress Notes (Signed)
Patient presents to office for nurse visit due to recent changes in medication at last visit and elevated BP. Patient hasn't taken his medications today due to having lab work but says he will take it when he get home. Patient hasn't missed any doses of medication and reports no side effects. No c/o chest pain, dizziness or sob.

## 2014-06-28 ENCOUNTER — Telehealth: Payer: Self-pay | Admitting: Cardiology

## 2014-06-28 NOTE — Telephone Encounter (Signed)
Patient came in wanting to reschedule his Oct appt to a sooner appointment due to experiencing recent chest pains.  He was not currently experiencing active chest pain when he came in.   I reschedule him to Sept 15th and advised him to go to ER at anytime that he felt he needed to.   He asked that Dr Nelly Laurence nurse contact him just to discuss what he has been experiencing

## 2014-06-28 NOTE — Telephone Encounter (Signed)
Spoke with sister Mardene Celeste) - stated he was currently at [facility] for elevated BP.

## 2014-06-29 ENCOUNTER — Encounter (HOSPITAL_COMMUNITY): Payer: Self-pay | Admitting: Nurse Practitioner

## 2014-06-29 ENCOUNTER — Inpatient Hospital Stay (HOSPITAL_COMMUNITY)
Admission: AD | Admit: 2014-06-29 | Discharge: 2014-07-01 | DRG: 287 | Disposition: A | Discharge status: Home / Self Care | Payer: Medicaid Other | Source: Other Acute Inpatient Hospital | Attending: Cardiovascular Disease | Admitting: Cardiovascular Disease

## 2014-06-29 ENCOUNTER — Telehealth: Payer: Self-pay | Admitting: Cardiology

## 2014-06-29 DIAGNOSIS — Z720 Tobacco use: Secondary | ICD-10-CM

## 2014-06-29 DIAGNOSIS — M5020 Other cervical disc displacement, unspecified cervical region: Secondary | ICD-10-CM | POA: Diagnosis present

## 2014-06-29 DIAGNOSIS — I509 Heart failure, unspecified: Secondary | ICD-10-CM | POA: Diagnosis present

## 2014-06-29 DIAGNOSIS — I10 Essential (primary) hypertension: Secondary | ICD-10-CM | POA: Diagnosis present

## 2014-06-29 DIAGNOSIS — Z8673 Personal history of transient ischemic attack (TIA), and cerebral infarction without residual deficits: Secondary | ICD-10-CM | POA: Diagnosis not present

## 2014-06-29 DIAGNOSIS — E785 Hyperlipidemia, unspecified: Secondary | ICD-10-CM | POA: Diagnosis present

## 2014-06-29 DIAGNOSIS — I2510 Atherosclerotic heart disease of native coronary artery without angina pectoris: Secondary | ICD-10-CM | POA: Diagnosis present

## 2014-06-29 DIAGNOSIS — E118 Type 2 diabetes mellitus with unspecified complications: Secondary | ICD-10-CM | POA: Diagnosis present

## 2014-06-29 DIAGNOSIS — M5030 Other cervical disc degeneration, unspecified cervical region: Secondary | ICD-10-CM | POA: Diagnosis present

## 2014-06-29 DIAGNOSIS — Z9861 Coronary angioplasty status: Secondary | ICD-10-CM | POA: Diagnosis not present

## 2014-06-29 DIAGNOSIS — I200 Unstable angina: Secondary | ICD-10-CM | POA: Diagnosis present

## 2014-06-29 DIAGNOSIS — G8929 Other chronic pain: Secondary | ICD-10-CM | POA: Diagnosis present

## 2014-06-29 DIAGNOSIS — I25110 Atherosclerotic heart disease of native coronary artery with unstable angina pectoris: Secondary | ICD-10-CM

## 2014-06-29 DIAGNOSIS — I5032 Chronic diastolic (congestive) heart failure: Secondary | ICD-10-CM | POA: Diagnosis present

## 2014-06-29 DIAGNOSIS — F17200 Nicotine dependence, unspecified, uncomplicated: Secondary | ICD-10-CM | POA: Diagnosis present

## 2014-06-29 DIAGNOSIS — I519 Heart disease, unspecified: Secondary | ICD-10-CM | POA: Diagnosis present

## 2014-06-29 DIAGNOSIS — E7800 Pure hypercholesterolemia, unspecified: Secondary | ICD-10-CM | POA: Diagnosis present

## 2014-06-29 DIAGNOSIS — Z7982 Long term (current) use of aspirin: Secondary | ICD-10-CM | POA: Diagnosis not present

## 2014-06-29 DIAGNOSIS — F411 Generalized anxiety disorder: Secondary | ICD-10-CM | POA: Diagnosis present

## 2014-06-29 DIAGNOSIS — F3289 Other specified depressive episodes: Secondary | ICD-10-CM | POA: Diagnosis present

## 2014-06-29 DIAGNOSIS — I252 Old myocardial infarction: Secondary | ICD-10-CM | POA: Diagnosis not present

## 2014-06-29 DIAGNOSIS — K219 Gastro-esophageal reflux disease without esophagitis: Secondary | ICD-10-CM | POA: Diagnosis present

## 2014-06-29 DIAGNOSIS — F329 Major depressive disorder, single episode, unspecified: Secondary | ICD-10-CM | POA: Diagnosis present

## 2014-06-29 HISTORY — DX: Type 2 diabetes mellitus without complications: E11.9

## 2014-06-29 LAB — GLUCOSE, CAPILLARY
GLUCOSE-CAPILLARY: 105 mg/dL — AB (ref 70–99)
GLUCOSE-CAPILLARY: 216 mg/dL — AB (ref 70–99)
Glucose-Capillary: 66 mg/dL — ABNORMAL LOW (ref 70–99)
Glucose-Capillary: 170 mg/dL — ABNORMAL HIGH (ref 70–99)

## 2014-06-29 LAB — HEPARIN LEVEL (UNFRACTIONATED): Heparin Unfractionated: <0.1 IU/mL — ABNORMAL LOW (ref 0.30–0.70)

## 2014-06-29 MED ORDER — METHOCARBAMOL 500 MG PO TABS
500.0000 mg | ORAL_TABLET | Freq: Three times a day (TID) | ORAL | Status: DC | PRN
  Filled 2014-06-29: qty 1

## 2014-06-29 MED ORDER — GABAPENTIN 100 MG PO CAPS
100.0000 mg | ORAL_CAPSULE | Freq: Three times a day (TID) | ORAL | Status: DC
  Administered 2014-06-29 – 2014-07-01 (×4): 100 mg via ORAL
  Filled 2014-06-29 (×7): qty 1

## 2014-06-29 MED ORDER — SODIUM CHLORIDE 0.9 % IJ SOLN: 3.0000 mL | INTRAMUSCULAR | Status: DC | PRN

## 2014-06-29 MED ORDER — ACETAMINOPHEN 325 MG PO TABS: 650.0000 mg | ORAL_TABLET | ORAL | Status: DC | PRN

## 2014-06-29 MED ORDER — HEPARIN BOLUS VIA INFUSION
2300.0000 [IU] | Freq: Once | INTRAVENOUS | Status: AC
  Administered 2014-06-29: 2300 [IU] via INTRAVENOUS
  Filled 2014-06-29: qty 2300

## 2014-06-29 MED ORDER — NICOTINE 14 MG/24HR TD PT24
14.0000 mg | MEDICATED_PATCH | TRANSDERMAL | Status: DC
  Administered 2014-06-29 – 2014-06-30 (×2): 14 mg via TRANSDERMAL
  Filled 2014-06-29 (×3): qty 1

## 2014-06-29 MED ORDER — ATORVASTATIN CALCIUM 80 MG PO TABS
80.0000 mg | ORAL_TABLET | Freq: Every day | ORAL | Status: DC
Start: 2014-06-30 — End: 2014-07-01
  Administered 2014-06-30 – 2014-07-01 (×2): 80 mg via ORAL
  Filled 2014-06-29 (×2): qty 1

## 2014-06-29 MED ORDER — SODIUM CHLORIDE 0.9 % IV SOLN
INTRAVENOUS | Status: DC
  Administered 2014-06-30: 04:00:00 via INTRAVENOUS

## 2014-06-29 MED ORDER — ONDANSETRON HCL 4 MG/2ML IJ SOLN: 4.0000 mg | Freq: Four times a day (QID) | INTRAMUSCULAR | Status: DC | PRN

## 2014-06-29 MED ORDER — ASPIRIN EC 81 MG PO TBEC
81.0000 mg | DELAYED_RELEASE_TABLET | Freq: Every day | ORAL | Status: DC
  Administered 2014-06-30 – 2014-07-01 (×2): 81 mg via ORAL
  Filled 2014-06-29 (×2): qty 1

## 2014-06-29 MED ORDER — TRAMADOL-ACETAMINOPHEN 37.5-325 MG PO TABS
1.0000 | ORAL_TABLET | Freq: Three times a day (TID) | ORAL | Status: DC | PRN
  Administered 2014-07-01: 1 via ORAL
  Filled 2014-06-29: qty 1

## 2014-06-29 MED ORDER — NITROGLYCERIN 0.4 MG SL SUBL: 0.4000 mg | SUBLINGUAL_TABLET | SUBLINGUAL | Status: DC | PRN

## 2014-06-29 MED ORDER — GLIPIZIDE ER 10 MG PO TB24
10.0000 mg | ORAL_TABLET | Freq: Every day | ORAL | Status: DC
  Administered 2014-07-01: 10 mg via ORAL
  Filled 2014-06-29 (×3): qty 1

## 2014-06-29 MED ORDER — NITROGLYCERIN 2 % TD OINT
0.5000 [in_us] | TOPICAL_OINTMENT | Freq: Four times a day (QID) | TRANSDERMAL | Status: DC
  Administered 2014-06-30: 0.5 [in_us] via TOPICAL
  Filled 2014-06-29: qty 30

## 2014-06-29 MED ORDER — INSULIN ASPART 100 UNIT/ML ~~LOC~~ SOLN: 0.0000 [IU] | Freq: Three times a day (TID) | SUBCUTANEOUS | Status: DC

## 2014-06-29 MED ORDER — LISINOPRIL 20 MG PO TABS
20.0000 mg | ORAL_TABLET | Freq: Every day | ORAL | Status: DC
  Administered 2014-06-29 – 2014-07-01 (×3): 20 mg via ORAL
  Filled 2014-06-29 (×3): qty 1

## 2014-06-29 MED ORDER — SODIUM CHLORIDE 0.9 % IJ SOLN: 3.0000 mL | Freq: Two times a day (BID) | INTRAMUSCULAR | Status: DC

## 2014-06-29 MED ORDER — METOPROLOL TARTRATE 25 MG PO TABS
25.0000 mg | ORAL_TABLET | Freq: Two times a day (BID) | ORAL | Status: DC
  Administered 2014-06-29 – 2014-07-01 (×4): 25 mg via ORAL
  Filled 2014-06-29 (×5): qty 1

## 2014-06-29 MED ORDER — ASPIRIN 81 MG PO CHEW
81.0000 mg | CHEWABLE_TABLET | ORAL | Status: AC
  Administered 2014-06-30: 81 mg via ORAL
  Filled 2014-06-29: qty 1

## 2014-06-29 MED ORDER — HEPARIN (PORCINE) IN NACL 100-0.45 UNIT/ML-% IJ SOLN
1250.0000 [IU]/h | INTRAMUSCULAR | Status: DC
  Administered 2014-06-30: 1250 [IU]/h via INTRAVENOUS
  Filled 2014-06-29 (×2): qty 250

## 2014-06-29 MED ORDER — SODIUM CHLORIDE 0.9 % IV SOLN: 250.0000 mL | INTRAVENOUS | Status: DC | PRN
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
## 2014-06-29 NOTE — Telephone Encounter (Signed)
Contacted by Dr Ronne Binning at [facility], patient admitted with symptoms concerning for angina that occurred at rest. By report no specific EKG changes, negative troponins. Nuclear stress by verbal report over phone with large inferolateral wall reversible defect. Patient with known multivessel CAD including known CTO LCX that was not able to be opened last year, also known RCA disease with prior stenting. Either could explain inferolateral ischemia. I have asked he be transferred for [facility] for further management and likely repeat cath.   Zandra Abts MD

## 2014-06-30 ENCOUNTER — Ambulatory Visit (HOSPITAL_COMMUNITY): Admission: RE | Admit: 2014-06-30 | Payer: Medicaid Other | Source: Ambulatory Visit | Admitting: Cardiovascular Disease

## 2014-06-30 ENCOUNTER — Encounter (HOSPITAL_COMMUNITY)
Admission: AD | Disposition: A | Discharge status: Home / Self Care | Payer: Medicaid Other | Source: Other Acute Inpatient Hospital | Attending: Cardiovascular Disease

## 2014-06-30 DIAGNOSIS — I2510 Atherosclerotic heart disease of native coronary artery without angina pectoris: Secondary | ICD-10-CM

## 2014-06-30 HISTORY — PX: LEFT HEART CATHETERIZATION WITH CORONARY ANGIOGRAM: SHX5451

## 2014-06-30 LAB — COMPREHENSIVE METABOLIC PANEL
ALT: 23 U/L (ref 0–53)
AST: 21 U/L (ref 0–37)
Albumin: 3.4 g/dL — ABNORMAL LOW (ref 3.5–5.2)
Alkaline Phosphatase: 59 U/L (ref 39–117)
Anion gap: 10 (ref 5–15)
BUN: 10 mg/dL (ref 6–23)
CALCIUM: 9.1 mg/dL (ref 8.4–10.5)
CO2: 28 meq/L (ref 19–32)
Chloride: 105 mEq/L (ref 96–112)
Creatinine, Ser: 1.07 mg/dL (ref 0.50–1.35)
GFR calc Af Amer: 87 mL/min — ABNORMAL LOW (ref >90)
GFR calc non Af Amer: 75 mL/min — ABNORMAL LOW (ref >90)
Glucose, Bld: 122 mg/dL — ABNORMAL HIGH (ref 70–99)
Potassium: 4.5 mEq/L (ref 3.7–5.3)
SODIUM: 143 meq/L (ref 137–147)
TOTAL PROTEIN: 6.5 g/dL (ref 6.0–8.3)
Total Bilirubin: 0.3 mg/dL (ref 0.3–1.2)

## 2014-06-30 LAB — LIPID PANEL
CHOLESTEROL: 242 mg/dL — AB (ref 0–200)
HDL: 58 mg/dL (ref >39)
LDL Cholesterol: 162 mg/dL — ABNORMAL HIGH (ref 0–99)
TRIGLYCERIDES: 111 mg/dL (ref <150)
Total CHOL/HDL Ratio: 4.2 RATIO
VLDL: 22 mg/dL (ref 0–40)

## 2014-06-30 LAB — CBC
HCT: 42.8 % (ref 39.0–52.0)
HEMOGLOBIN: 14 g/dL (ref 13.0–17.0)
MCH: 27.8 pg (ref 26.0–34.0)
MCHC: 32.7 g/dL (ref 30.0–36.0)
MCV: 85.1 fL (ref 78.0–100.0)
Platelets: 182 10*3/uL (ref 150–400)
RBC: 5.03 MIL/uL (ref 4.22–5.81)
RDW: 15 % (ref 11.5–15.5)
WBC: 5.9 10*3/uL (ref 4.0–10.5)

## 2014-06-30 LAB — GLUCOSE, CAPILLARY
GLUCOSE-CAPILLARY: 120 mg/dL — AB (ref 70–99)
GLUCOSE-CAPILLARY: 103 mg/dL — AB (ref 70–99)
GLUCOSE-CAPILLARY: 187 mg/dL — AB (ref 70–99)
Glucose-Capillary: 117 mg/dL — ABNORMAL HIGH (ref 70–99)

## 2014-06-30 LAB — HEPARIN LEVEL (UNFRACTIONATED)
HEPARIN UNFRACTIONATED: 0.52 [IU]/mL (ref 0.30–0.70)
Heparin Unfractionated: 0.52 IU/mL (ref 0.30–0.70)

## 2014-06-30 LAB — PROTIME-INR
INR: 1.31 (ref 0.00–1.49)
Prothrombin Time: 16.3 seconds — ABNORMAL HIGH (ref 11.6–15.2)

## 2014-06-30 SURGERY — LEFT HEART CATHETERIZATION WITH CORONARY ANGIOGRAM
Anesthesia: LOCAL

## 2014-06-30 MED ORDER — SODIUM CHLORIDE 0.9 % IV SOLN
INTRAVENOUS | Status: AC
  Administered 2014-06-30: 18:00:00 via INTRAVENOUS

## 2014-06-30 MED ORDER — TRAMADOL HCL 50 MG PO TABS: 50.0000 mg | ORAL_TABLET | Freq: Two times a day (BID) | ORAL | Status: DC | PRN

## 2014-06-30 MED ORDER — LIDOCAINE HCL (PF) 1 % IJ SOLN
INTRAMUSCULAR | Status: AC
  Filled 2014-06-30: qty 30

## 2014-06-30 MED ORDER — VERAPAMIL HCL 2.5 MG/ML IV SOLN
INTRAVENOUS | Status: AC
  Filled 2014-06-30: qty 2

## 2014-06-30 MED ORDER — CYCLOBENZAPRINE HCL 10 MG PO TABS
10.0000 mg | ORAL_TABLET | Freq: Two times a day (BID) | ORAL | Status: DC | PRN
  Filled 2014-06-30: qty 1

## 2014-06-30 MED ORDER — HEPARIN SODIUM (PORCINE) 1000 UNIT/ML IJ SOLN
INTRAMUSCULAR | Status: AC
  Filled 2014-06-30: qty 1

## 2014-06-30 MED ORDER — ISOSORBIDE MONONITRATE ER 30 MG PO TB24
30.0000 mg | ORAL_TABLET | Freq: Every day | ORAL | Status: DC
  Administered 2014-06-30 – 2014-07-01 (×2): 30 mg via ORAL
  Filled 2014-06-30 (×2): qty 1

## 2014-06-30 MED ORDER — MIDAZOLAM HCL 2 MG/2ML IJ SOLN
INTRAMUSCULAR | Status: AC
  Filled 2014-06-30: qty 2

## 2014-06-30 MED ORDER — HEPARIN (PORCINE) IN NACL 2-0.9 UNIT/ML-% IJ SOLN
INTRAMUSCULAR | Status: AC
  Filled 2014-06-30: qty 1000

## 2014-06-30 MED ORDER — PNEUMOCOCCAL VAC POLYVALENT 25 MCG/0.5ML IJ INJ
0.5000 mL | INJECTION | INTRAMUSCULAR | Status: DC
  Filled 2014-06-30: qty 0.5

## 2014-06-30 MED ORDER — FENTANYL CITRATE 0.05 MG/ML IJ SOLN
INTRAMUSCULAR | Status: AC
  Filled 2014-06-30: qty 2

## 2014-06-30 MED FILL — Heparin Sodium (Porcine) 100 Unt/ML in Sodium Chloride 0.45%: INTRAMUSCULAR | Qty: 250 | Status: AC

## 2014-06-30 NOTE — Progress Notes (Signed)
ANTICOAGULATION CONSULT NOTE - Follow Up Consult  Pharmacy Consult for heparin Indication: USAP  Labs:  Recent Labs  06/29/14 1930 06/30/14 0350  HGB  --  14.0  HCT  --  42.8  PLT  --  182  LABPROT  --  16.3*  INR  --  1.31  HEPARINUNFRC <0.10* 0.52  CREATININE  --  1.07    Assessment: 58yo male now therapeutic on heparin after rate increase, plan for cath this afternoon.  Will continue gtt at current rate and confirm stable with additional level.  Wynona Neat, PharmD, BCPS  06/30/2014,5:06 AM

## 2014-06-30 NOTE — Progress Notes (Addendum)
Patient Name: Daniel Li Date of Encounter: 06/30/2014  Principal Problem:   Unstable angina Active Problems:   Diabetes mellitus type 2 with complications   Hypertension, essential   Hypercholesterolemia   Tobacco abuse    Patient Profile: 58 yo AA male with a history of HTN, HLD, DM type 2, tobacco abuse, multiple CVAs, chronic diastolic CHF, CAD s/p BMS to 1st OM & RCA and occlusion of the LCx admitted on 06/29/14 for chest pain.   SUBJECTIVE: No chest pain or DOE overnight. Orthopnea remains, but improving. No dizziness, swelling in extremities, or presyncopal symptoms. Patient states that he is overall fatigued. No other complaints. Patient repeatedly joked about his hunger pains and anticipated being able to eat following the cath.   OBJECTIVE Filed Vitals:   06/29/14 1827 06/29/14 2039 06/30/14 0454 06/30/14 0915  BP: 142/111 145/93 150/107 139/98  Pulse: 74 77 79 74  Temp: 98.8 F (37.1 C) 98.4 F (36.9 C) 97.7 F (36.5 C) 98.7 F (37.1 C)  TempSrc: Oral Oral Oral Oral  Resp: 18 18 18    Height: 5\' 6"  (1.676 m)     Weight: 174 lb (78.926 kg)  189 lb (85.73 kg)   SpO2: 97% 96% 98% 94%   No intake or output data in the 24 hours ending 06/30/14 1032 Filed Weights   06/29/14 1827 06/30/14 0454  Weight: 174 lb (78.926 kg) 189 lb (85.73 kg)    PHYSICAL EXAM General: Well developed, well nourished, male in no acute distress. Head: Normocephalic, atraumatic.  Neck: Supple without bruits, JVD not elevated. Lungs:  Resp regular and unlabored, Few basilar crackles bilaterally. Heart: RRR, S1, S2, no S3, S4, or murmur; no rub. Abdomen: Soft, non-tender, non-distended, BS + x 4.  Extremities: No clubbing, cyanosis, edema.  Neuro: Alert and oriented X 3. Moves all extremities spontaneously. Psych: Normal affect.  LABS: CBC:  Recent Labs  06/30/14 0350  WBC 5.9  HGB 14.0  HCT 42.8  MCV 85.1  PLT 182   INR:  Recent Labs  06/30/14 0350  INR 1.31     Basic Metabolic Panel:  Recent Labs  06/30/14 0350  NA 143  K 4.5  CL 105  CO2 28  GLUCOSE 122*  BUN 10  CREATININE 1.07  CALCIUM 9.1   Liver Function Tests:  Recent Labs  06/30/14 0350  AST 21  ALT 23  ALKPHOS 59  BILITOT 0.3  PROT 6.5  ALBUMIN 3.4*  Fasting Lipid Panel:  Recent Labs  06/30/14 0350  CHOL 242*  HDL 58  LDLCALC 162*  TRIG 111  CHOLHDL 4.2    TELE:   SR, No significant ectopy noted.  ECG: SR with t wave inversions in leads I, II, V4 and V5 and LVH (old)  Current Medications:  . aspirin EC  81 mg Oral Daily  . atorvastatin  80 mg Oral q1800  . gabapentin  100 mg Oral TID  . glipiZIDE  10 mg Oral QAC breakfast  . insulin aspart  0-15 Units Subcutaneous TID WC  . lisinopril  20 mg Oral Daily  . metoprolol tartrate  25 mg Oral BID  . nicotine  14 mg Transdermal Q24H  . nitroGLYCERIN  0.5 inch Topical 4 times per day  . sodium chloride  3 mL Intravenous Q12H   . sodium chloride 50 mL/hr at 06/30/14 0400  . heparin 1,250 Units/hr (06/30/14 0956)    ASSESSMENT AND PLAN:   Unstable angina: Stress testing 06/29/2014 which  revealed a large area of reversibility in the inferolateral wall. He has known prior history of right coronary artery stenting as well as an occluded left circumflex based on catheterization in 2014. He is currently chest pain-free. Continue aspirin, beta blocker, statin and heparin therapy. NPO for scheduled for diagnostic cardiac catheterization today. If stenting is required, a BMS may be necessary due to potential back surgery in the near future.     HTN: Stable.       HLD: Cholesterol and LDL high. Liver function tests look good. Lipitor 80 mg is new.    DM: Cont glipizide and SSI.        ? New ICM: Ef 30% on spect. Prev nl in 10/2012. Will repeat echo, timing per MD. Euvolemic on exam. Cont BB/ACE.     Tob Abuse: Cessation advised.      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 We will need to keep this in mind in case he requires stenting. On home rx.  Signed, Lucy Antigua, PA-S  Seen and agree with changes made. Rosaria Ferries , PA-C 10:32 AM 06/30/2014   Patient seen and examined. Agree with assessment and plan. No recurrent chest pain on IV heparin. EF 30% on nuclear study yesterday with a large area of inferolateral ischemia, probable contributed by occluded Lcx but also ? Progressive RCA disease. For cath and possible PCI later today. If stenting is necessary may need BMS in light of potential surgery in near future. Not a candidate for effient with prior CVA's. LDL 161, now on lipitor 80 mg.    Troy Sine, MD, [facility] 06/30/2014 11:03 AM

## 2014-06-30 NOTE — Progress Notes (Signed)
TR band removed; pressure dsg applied; no immediate complications noted.  BP (149/102) has been elevated but this is pt's baseline.  30mg  Imdur PO ordered by MD and given to pt at 17:51.  Informed pt to leave pressure dsg in place for at least 24hr.  LT radial has remained at level 0.  Will continue to monitor.

## 2014-06-30 NOTE — CV Procedure (Signed)
Cardiac Catheterization Operative Report  Sylvain Hasten Cooter [id number] 9/2/20154:16 PM FANTA,TESFAYE, MD  Procedure Performed:  1. Left Heart Catheterization 2. Selective Coronary Angiography 3. Left ventricular angiogram  Operator: Lauree Chandler, MD  Arterial access site:  Left radial artery.   Indication: 58 yo male with history of CAD with prior PCI and known occlusion of Circumflex, HTN, HLD, DM who was transferred to [facility] from [facility] ED where he was admitted with chest pain and had a stress myoview with inferolateral ischemia, new LV dysfunction with LVEF=30%. Troponin negative.                                     Procedure Details: The risks, benefits, complications, treatment options, and expected outcomes were discussed with the patient. The patient and/or family concurred with the proposed plan, giving informed consent. The patient was brought to the cath lab after IV hydration was begun and oral premedication was given. The patient was further sedated with Versed and Fentanyl. The left wrist was assessed with a reverse Allens test which was positive. The left wrist was prepped and draped in a sterile fashion. 1% lidocaine was used for local anesthesia. Using the modified Seldinger access technique, a 5 French sheath was placed in the left radial artery. 3 mg Verapamil was given through the sheath. 4100 units IV heparin was given. Standard diagnostic catheters were used to perform selective coronary angiography. A pigtail catheter was used to perform a left ventricular angiogram. The sheath was removed from the right radial artery and a Terumo hemostasis band was applied at the arteriotomy site on the right wrist.   There were no immediate complications. The patient was taken to the recovery area in stable condition.   Hemodynamic Findings: Central aortic pressure: 131/86 Left ventricular pressure: 132/14/20  Angiographic Findings:  Left main: No obstructive  disease.   Left Anterior Descending Artery: Moderate caliber vessel that courses to the apex. The proximal vessel has serial 30% stenoses. The mid vessel has an eccentric 40% stenosis just beyond the takeoff of the diagonal branch. There is a smooth irregularity which is unchanged from last cath in 2014. The distal LAD has a 40% focal stenosis. The diagonal branch is moderate in caliber with patent proximal stent. The mid portion of the diagonal branch has a 60-70% stenosis (1.75 mm vessel). This is unchanged from last cath.   Circumflex Artery: 100% proximal occlusion. (chronic). The mid and distal vessel fills from left to left collaterals.   Right Coronary Artery: Large, dominant vessel with patent proximal stent. The stented segment has no restenosis. The distal vessel has serial 30-40% stenoses. The PDA is moderate in caliber and has a focal 50% stenosis, unchanged. The Posterolateral branch has a sub-total occlusion and fills from right to right collaterals.(This is known from prior cath)  Left Ventricular Angiogram: LVEF=35%  Impression: 1. Triple vessel CAD. His coronary anatomy is grossly unchanged from cath in January 2015. He has diffuse disease in his distal RCA that is not amenable to PCI. His Circumflex is chronically occluded and fills from collaterals. The LAD has mild to moderate non-obstructive disease. There is moderate disease in the diagonal branch and the PDA.  2. Moderate LV systolic dysfunction  Recommendations: Will continue medical management. I will add Imdur.        Complications:  None. The patient tolerated the procedure well.

## 2014-06-30 NOTE — Progress Notes (Signed)
ANTICOAGULATION CONSULT NOTE - Initial Consult  Pharmacy Consult for Heparin Indication: chest pain/ACS  Allergies  Allergen Reactions  . Pork-Derived Products Other (See Comments)    Does not eat for religious reasons    Patient Measurements: Height: 5\' 6"  (167.6 cm) Weight: 189 lb (85.73 kg) IBW/kg (Calculated) : 63.8 Heparin Dosing Weight: 79 kg  Vital Signs: Temp: 98.7 F (37.1 C) (09/02 0915) Temp src: Oral (09/02 0915) BP: 139/98 mmHg (09/02 0915) Pulse Rate: 74 (09/02 0915)  Labs:  Recent Labs  06/29/14 1930 06/30/14 0350 06/30/14 1010  HGB  --  14.0  --   HCT  --  42.8  --   PLT  --  182  --   LABPROT  --  16.3*  --   INR  --  1.31  --   HEPARINUNFRC <0.10* 0.52 0.52  CREATININE  --  1.07  --     Estimated Creatinine Clearance: 78.2 ml/min (by C-G formula based on Cr of 1.07).   Medical History: Past Medical History  Diagnosis Date  . Type 2 diabetes mellitus   . Burn   . Tibia fracture (l) leg  . Hypercholesterolemia   . HTN (hypertension)   . CAD (coronary artery disease)     a.. NSTEMI s/p BMS to 1st Diagonal and distal OM2 in 2007; b. STEMI 03/26/12 s/p BMS to RCA; c. NSTEMI 10/2012 cath  chronic occlusion of LCx (unable to open with PCI) and PL branch, mod dz of LAD and diagonal, and preserved LV systolic fxn, Med Rx;  d. 06/2014 Myoview:  EF 30%, large area of reversibility in inflat wall.  . Tobacco abuse   . Chronic back pain   . Arthritis   . GERD (gastroesophageal reflux disease)   . Sciatic pain   . Chronic arm pain   . DDD (degenerative disc disease), cervical   . Stroke     a. multiple dating back to 2002.  . Mental disorder   . Depression   . Anxiety   . Chronic diastolic CHF (congestive heart failure)     a. 10/2012 Echo: EF 50-55%, mild HK of dist antsept myocardium, Gr 1 DD.    Medications:  Prescriptions prior to admission  Medication Sig Dispense Refill  . albuterol (PROVENTIL HFA;VENTOLIN HFA) 108 (90 BASE) MCG/ACT  inhaler Inhale 2 puffs into the lungs 2 (two) times daily as needed for wheezing or shortness of breath.      [name] Kitchen aspirin EC 81 MG tablet Take 81 mg by mouth daily.      . cyclobenzaprine (FLEXERIL) 10 MG tablet Take 10 mg by mouth 2 (two) times daily as needed for muscle spasms.      [name] Kitchen gabapentin (NEURONTIN) 100 MG capsule Take 100 mg by mouth 3 (three) times daily.      [name] Kitchen glipiZIDE (GLUCOTROL XL) 10 MG 24 hr tablet Take 1 tablet (10 mg total) by mouth daily.  30 tablet  1  . hydrochlorothiazide (HYDRODIURIL) 25 MG tablet Take 1 tablet (25 mg total) by mouth daily.  30 tablet  6  . lisinopril (PRINIVIL,ZESTRIL) 20 MG tablet Take 1 tablet (20 mg total) by mouth daily.  30 tablet  6  . metoprolol tartrate (LOPRESSOR) 25 MG tablet Take 25 mg by mouth 2 (two) times daily.      . nitroGLYCERIN (NITROSTAT) 0.4 MG SL tablet Place 0.4 mg under the tongue every 5 (five) minutes as needed for chest pain (up to 3 doses).      [name] Kitchen  traMADol (ULTRAM) 50 MG tablet Take 50 mg by mouth 2 (two) times daily as needed for moderate pain.        Assessment: 58yo male transferred from [facility] on heparin for chest pain. MI was ruled out prior to transfer.  He has a history of CAD, s/p NSTEMI in 2007 and has history of CVAs. Heparin level is therapeutic x2.  No signs of bleeding  Goal of Therapy:  Heparin level 0.3-0.7 units/ml Monitor platelets by anticoagulation protocol: Yes   Plan:  -Continue heparin 1250 units/hr -Daily HL, CBC -F/u after cath this afternoon   Hughes Better, PharmD, BCPS Clinical Pharmacist Pager: [phone] 06/30/2014 11:07 AM

## 2014-06-30 NOTE — Interval H&P Note (Signed)
History and Physical Interval Note:  06/30/2014 3:35 PM  Daniel Li  has presented today for cardiac cath with the diagnosis of cp/unstable angina.  The various methods of treatment have been discussed with the patient and family. After consideration of risks, benefits and other options for treatment, the patient has consented to  Procedure(s): LEFT HEART CATHETERIZATION WITH CORONARY ANGIOGRAM (N/A) as a surgical intervention .  The patient's history has been reviewed, patient examined, no change in status, stable for surgery.  I have reviewed the patient's chart and labs.  Questions were answered to the patient's satisfaction.    Cath Lab Visit (complete for each Cath Lab visit)  Clinical Evaluation Leading to the Procedure:   ACS: No.  Non-ACS:    Anginal Classification: CCS III  Anti-ischemic medical therapy: Minimal Therapy (1 class of medications)  Non-Invasive Test Results: No non-invasive testing performed  Prior CABG: No previous CABG        MCALHANY,CHRISTOPHER

## 2014-06-30 NOTE — H&P (View-Only) (Signed)
Patient Name: Shadrach Bartunek Ptacek Date of Encounter: 06/30/2014  Principal Problem:   Unstable angina Active Problems:   Diabetes mellitus type 2 with complications   Hypertension, essential   Hypercholesterolemia   Tobacco abuse    Patient Profile: 58 yo AA male with a history of HTN, HLD, DM type 2, tobacco abuse, multiple CVAs, chronic diastolic CHF, CAD s/p BMS to 1st OM & RCA and occlusion of the LCx admitted on 06/29/14 for chest pain.   SUBJECTIVE: No chest pain or DOE overnight. Orthopnea remains, but improving. No dizziness, swelling in extremities, or presyncopal symptoms. Patient states that he is overall fatigued. No other complaints. Patient repeatedly joked about his hunger pains and anticipated being able to eat following the cath.   OBJECTIVE Filed Vitals:   06/29/14 1827 06/29/14 2039 06/30/14 0454 06/30/14 0915  BP: 142/111 145/93 150/107 139/98  Pulse: 74 77 79 74  Temp: 98.8 F (37.1 C) 98.4 F (36.9 C) 97.7 F (36.5 C) 98.7 F (37.1 C)  TempSrc: Oral Oral Oral Oral  Resp: 18 18 18    Height: 5\' 6"  (1.676 m)     Weight: 174 lb (78.926 kg)  189 lb (85.73 kg)   SpO2: 97% 96% 98% 94%   No intake or output data in the 24 hours ending 06/30/14 1032 Filed Weights   06/29/14 1827 06/30/14 0454  Weight: 174 lb (78.926 kg) 189 lb (85.73 kg)    PHYSICAL EXAM General: Well developed, well nourished, male in no acute distress. Head: Normocephalic, atraumatic.  Neck: Supple without bruits, JVD not elevated. Lungs:  Resp regular and unlabored, Few basilar crackles bilaterally. Heart: RRR, S1, S2, no S3, S4, or murmur; no rub. Abdomen: Soft, non-tender, non-distended, BS + x 4.  Extremities: No clubbing, cyanosis, edema.  Neuro: Alert and oriented X 3. Moves all extremities spontaneously. Psych: Normal affect.  LABS: CBC:  Recent Labs  06/30/14 0350  WBC 5.9  HGB 14.0  HCT 42.8  MCV 85.1  PLT 182   INR:  Recent Labs  06/30/14 0350  INR 1.31     Basic Metabolic Panel:  Recent Labs  06/30/14 0350  NA 143  K 4.5  CL 105  CO2 28  GLUCOSE 122*  BUN 10  CREATININE 1.07  CALCIUM 9.1   Liver Function Tests:  Recent Labs  06/30/14 0350  AST 21  ALT 23  ALKPHOS 59  BILITOT 0.3  PROT 6.5  ALBUMIN 3.4*  Fasting Lipid Panel:  Recent Labs  06/30/14 0350  CHOL 242*  HDL 58  LDLCALC 162*  TRIG 111  CHOLHDL 4.2    TELE:   SR, No significant ectopy noted.  ECG: SR with t wave inversions in leads I, II, V4 and V5 and LVH (old)  Current Medications:  . aspirin EC  81 mg Oral Daily  . atorvastatin  80 mg Oral q1800  . gabapentin  100 mg Oral TID  . glipiZIDE  10 mg Oral QAC breakfast  . insulin aspart  0-15 Units Subcutaneous TID WC  . lisinopril  20 mg Oral Daily  . metoprolol tartrate  25 mg Oral BID  . nicotine  14 mg Transdermal Q24H  . nitroGLYCERIN  0.5 inch Topical 4 times per day  . sodium chloride  3 mL Intravenous Q12H   . sodium chloride 50 mL/hr at 06/30/14 0400  . heparin 1,250 Units/hr (06/30/14 0956)    ASSESSMENT AND PLAN:   Unstable angina: Stress testing 06/29/2014 which  revealed a large area of reversibility in the inferolateral wall. He has known prior history of right coronary artery stenting as well as an occluded left circumflex based on catheterization in 2014. He is currently chest pain-free. Continue aspirin, beta blocker, statin and heparin therapy. NPO for scheduled for diagnostic cardiac catheterization today. If stenting is required, a BMS may be necessary due to potential back surgery in the near future.     HTN: Stable.       HLD: Cholesterol and LDL high. Liver function tests look good. Lipitor 80 mg is new.    DM: Cont glipizide and SSI.        ? New ICM: Ef 30% on spect. Prev nl in 10/2012. Will repeat echo, timing per MD. Euvolemic on exam. Cont BB/ACE.     Tob Abuse: Cessation advised.      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 We will need to keep this in mind in case he requires stenting. On home rx.  Signed, Lucy Antigua, PA-S  Seen and agree with changes made. Rosaria Ferries , PA-C 10:32 AM 06/30/2014   Patient seen and examined. Agree with assessment and plan. No recurrent chest pain on IV heparin. EF 30% on nuclear study yesterday with a large area of inferolateral ischemia, probable contributed by occluded Lcx but also ? Progressive RCA disease. For cath and possible PCI later today. If stenting is necessary may need BMS in light of potential surgery in near future. Not a candidate for effient with prior CVA's. LDL 161, now on lipitor 80 mg.    Troy Sine, MD, [facility] 06/30/2014 11:03 AM

## 2014-06-30 NOTE — Progress Notes (Signed)
Utilization review completed.  

## 2014-07-01 ENCOUNTER — Encounter (HOSPITAL_COMMUNITY): Payer: Self-pay | Admitting: General Practice

## 2014-07-01 LAB — GLUCOSE, CAPILLARY
GLUCOSE-CAPILLARY: 129 mg/dL — AB (ref 70–99)
Glucose-Capillary: 145 mg/dL — ABNORMAL HIGH (ref 70–99)

## 2014-07-01 MED ORDER — METOPROLOL TARTRATE 25 MG PO TABS: 37.5000 mg | ORAL_TABLET | Freq: Two times a day (BID) | ORAL | Status: DC

## 2014-07-01 MED ORDER — ATORVASTATIN CALCIUM 80 MG PO TABS: 80.0000 mg | ORAL_TABLET | Freq: Every day | ORAL | Status: DC

## 2014-07-01 MED ORDER — ISOSORBIDE MONONITRATE ER 30 MG PO TB24: 30.0000 mg | ORAL_TABLET | Freq: Every day | ORAL | Status: DC

## 2014-07-01 MED ORDER — METOPROLOL TARTRATE 25 MG PO TABS
37.5000 mg | ORAL_TABLET | Freq: Two times a day (BID) | ORAL | Status: DC
  Filled 2014-07-01: qty 1

## 2014-07-01 MED ORDER — METOPROLOL TARTRATE 12.5 MG HALF TABLET
12.5000 mg | ORAL_TABLET | Freq: Once | ORAL | Status: AC
  Administered 2014-07-01: 12.5 mg via ORAL
  Filled 2014-07-01: qty 1

## 2014-07-01 NOTE — Discharge Instructions (Signed)
PLEASE REMEMBER TO BRING ALL OF YOUR MEDICATIONS TO EACH OF YOUR FOLLOW-UP OFFICE VISITS.  PLEASE ATTEND ALL SCHEDULED FOLLOW-UP APPOINTMENTS.   Activity: Increase activity slowly as tolerated. You may shower, but no soaking baths (or swimming) for 1 week. No driving for 2 days. No lifting over 5 lbs for 1 week. No sexual activity for 1 week.   You May Return to Work: in 1 week (if applicable)  Wound Care: You may wash cath site gently with soap and water. Keep cath site clean and dry. If you notice pain, swelling, bleeding or pus at your cath site, please call [phone].    Cardiac Cath Site Care Refer to this sheet in the next few weeks. These instructions provide you with information on caring for yourself after your procedure. Your caregiver may also give you more specific instructions. Your treatment has been planned according to current medical practices, but problems sometimes occur. Call your caregiver if you have any problems or questions after your procedure. HOME CARE INSTRUCTIONS  You may shower 24 hours after the procedure. Remove the bandage (dressing) and gently wash the site with plain soap and water. Gently pat the site dry.   Do not apply powder or lotion to the site.   Do not sit in a bathtub, swimming pool, or whirlpool for 5 to 7 days.   No bending, squatting, or lifting anything over 10 pounds (4.5 kg) as directed by your caregiver.   Inspect the site at least twice daily.   Do not drive home if you are discharged the same day of the procedure. Have someone else drive you.   You may drive 24 hours after the procedure unless otherwise instructed by your caregiver.  What to expect:  Any bruising will usually fade within 1 to 2 weeks.   Blood that collects in the tissue (hematoma) may be painful to the touch. It should usually decrease in size and tenderness within 1 to 2 weeks.  SEEK IMMEDIATE MEDICAL CARE IF:  You have unusual pain at the site or down the  affected limb.   You have redness, warmth, swelling, or pain at the site.   You have drainage (other than a small amount of blood on the dressing).   You have chills.   You have a fever or persistent symptoms for more than 72 hours.   You have a fever and your symptoms suddenly get worse.   Your leg becomes pale, cool, tingly, or numb.   You have heavy bleeding from the site. Hold pressure on the site.  Document Released: 11/17/2010 Document Revised: 10/04/2011 Document Reviewed: 11/17/2010 [facility] Patient Information 2012 [place].    Angina Pectoris Angina pectoris is extreme discomfort in your chest, neck, or arm. Your doctor may call it just angina. It is caused by a lack of oxygen to your heart wall. It may feel like tightness or heavy pressure. It may feel like a crushing or squeezing pain. Some people say it feels like gas. It may go down your shoulders, back, and arms. Some people have symptoms other than pain. These include:  Tiredness.  Shortness of breath.  Cold sweats.  Feeling sick to your stomach (nausea). There are four types of angina:  Stable angina. This type often lasts the same amount of time each time it happens. Activity, stress, or excitement can bring it on. It often gets better after taking a medicine called nitroglycerin. This goes under your tongue.  Unstable angina. This type can  happen when you are not active or even during sleep. It can suddenly get worse or happen more often. It may not get better after taking the special medicine. It can last up to 30 minutes.  Microvascular angina. This type is more common in women. It may be more severe or last longer than other types.  Prinzmetal angina. This type often happens when you are not active or in the early morning hours. HOME CARE   Only take medicines as told by your doctor.  Stay active or exercise more as told by your doctor.  Limit very hard activity as told by your  doctor.  Limit heavy lifting as told by your doctor.  Keep a healthy weight.  Learn about and eat foods that are healthy for your heart.  Do not use any tobacco such as cigarettes, chewing tobacco, or e-cigarettes. GET HELP RIGHT AWAY IF:   You have chest, neck, deep shoulder, or arm pain or discomfort that lasts more than a few minutes.  You have chest, neck, deep shoulder, or arm pain or discomfort that goes away and comes back over and over again.  You have heavy sweating that seems to happen for no reason.  You have shortness of breath or trouble breathing.  Your angina does not get better after a few minutes of rest.  Your angina does not get better after you take nitroglycerin medicine. These can all be symptoms of a heart attack. Get help right away. Call your local emergency service (911 in [place] Do not  drive yourself to the hospital. Do not  wait to for your symptoms to go away. MAKE SURE YOU:   Understand these instructions.  Will watch your condition.  Will get help right away if you are not doing well or get worse. Document Released: 04/02/2008 Document Revised: 10/20/2013 Document Reviewed: 02/16/2014 [facility] Patient Information 2015 [place], [place]. This information is not intended to replace advice given to you by your health care provider. Make sure you discuss any questions you have with your health care provider.  Smoking Cessation Quitting smoking is important to your health and has many advantages. However, it is not always easy to quit since nicotine is a very addictive drug. Oftentimes, people try 3 times or more before being able to quit. This document explains the best ways for you to prepare to quit smoking. Quitting takes hard work and a lot of effort, but you can do it. ADVANTAGES OF QUITTING SMOKING  You will live longer, feel better, and live better.  Your body will feel the impact of quitting smoking almost immediately.  Within 20 minutes,  blood pressure decreases. Your pulse returns to its normal level.  After 8 hours, carbon monoxide levels in the blood return to normal. Your oxygen level increases.  After 24 hours, the chance of having a heart attack starts to decrease. Your breath, hair, and body stop smelling like smoke.  After 48 hours, damaged nerve endings begin to recover. Your sense of taste and smell improve.  After 72 hours, the body is virtually free of nicotine. Your bronchial tubes relax and breathing becomes easier.  After 2 to 12 weeks, lungs can hold more air. Exercise becomes easier and circulation improves.  The risk of having a heart attack, stroke, cancer, or lung disease is greatly reduced.  After 1 year, the risk of coronary heart disease is cut in half.  After 5 years, the risk of stroke falls to the same as a nonsmoker.  After 10 years, the risk of lung cancer is cut in half and the risk of other cancers decreases significantly.  After 15 years, the risk of coronary heart disease drops, usually to the level of a nonsmoker.  If you are pregnant, quitting smoking will improve your chances of having a healthy baby.  The people you live with, especially any children, will be healthier.  You will have extra money to spend on things other than cigarettes. QUESTIONS TO THINK ABOUT BEFORE ATTEMPTING TO QUIT You may want to talk about your answers with your health care provider.  Why do you want to quit?  If you tried to quit in the past, what helped and what did not?  What will be the most difficult situations for you after you quit? How will you plan to handle them?  Who can help you through the tough times? Your family? Friends? A health care provider?  What pleasures do you get from smoking? What ways can you still get pleasure if you quit? Here are some questions to ask your health care provider:  How can you help me to be successful at quitting?  What medicine do you think would be best  for me and how should I take it?  What should I do if I need more help?  What is smoking withdrawal like? How can I get information on withdrawal? GET READY  Set a quit date.  Change your environment by getting rid of all cigarettes, ashtrays, matches, and lighters in your home, car, or work. Do not let people smoke in your home.  Review your past attempts to quit. Think about what worked and what did not. GET SUPPORT AND ENCOURAGEMENT You have a better chance of being successful if you have help. You can get support in many ways.  Tell your family, friends, and coworkers that you are going to quit and need their support. Ask them not to smoke around you.  Get individual, group, or telephone counseling and support. Programs are available at [employer] and health centers. Call your local health department for information about programs in your area.  Spiritual beliefs and practices may help some smokers quit.  Download a "quit meter" on your computer to keep track of quit statistics, such as how long you have gone without smoking, cigarettes not smoked, and money saved.  Get a self-help book about quitting smoking and staying off tobacco. [place] yourself from urges to smoke. Talk to someone, go for a walk, or occupy your time with a task.  Change your normal routine. Take a different route to work. Drink tea instead of coffee. Eat breakfast in a different place.  Reduce your stress. Take a hot bath, exercise, or read a book.  Plan something enjoyable to do every day. Reward yourself for not smoking.  Explore interactive web-based programs that specialize in helping you quit. GET MEDICINE AND USE IT CORRECTLY Medicines can help you stop smoking and decrease the urge to smoke. Combining medicine with the above behavioral methods and support can greatly increase your chances of successfully quitting smoking.  Nicotine replacement therapy helps  deliver nicotine to your body without the negative effects and risks of smoking. Nicotine replacement therapy includes nicotine gum, lozenges, inhalers, nasal sprays, and skin patches. Some may be available over-the-counter and others require a prescription.  Antidepressant medicine helps people abstain from smoking, but how this works is unknown. This medicine is available by prescription.  Nicotinic receptor partial agonist medicine simulates the effect of nicotine in your brain. This medicine is available by prescription. Ask your health care provider for advice about which medicines to use and how to use them based on your health history. Your health care provider will tell you what side effects to look out for if you choose to be on a medicine or therapy. Carefully read the information on the package. Do not use any other product containing nicotine while using a nicotine replacement product.  RELAPSE OR DIFFICULT SITUATIONS Most relapses occur within the first 3 months after quitting. Do not be discouraged if you start smoking again. Remember, most people try several times before finally quitting. You may have symptoms of withdrawal because your body is used to nicotine. You may crave cigarettes, be irritable, feel very hungry, cough often, get headaches, or have difficulty concentrating. The withdrawal symptoms are only temporary. They are strongest when you first quit, but they will go away within 10-14 days. To reduce the chances of relapse, try to:  Avoid drinking alcohol. Drinking lowers your chances of successfully quitting.  Reduce the amount of caffeine you consume. Once you quit smoking, the amount of caffeine in your body increases and can give you symptoms, such as a rapid heartbeat, sweating, and anxiety.  Avoid smokers because they can make you want to smoke.  Do not let weight gain distract you. Many smokers will gain weight when they quit, usually less than 10 pounds. Eat a  healthy diet and stay active. You can always lose the weight gained after you quit.  Find ways to improve your mood other than smoking. FOR MORE INFORMATION  www.smokefree.gov  Document Released: 10/09/2001 Document Revised: 03/01/2014 Document Reviewed: 01/24/2012 [facility] Patient Information 2015 [place], [place]. This information is not intended to replace advice given to you by your health care provider. Make sure you discuss any questions you have with your health care provider.  You Can Quit Smoking If you are ready to quit smoking or are thinking about it, congratulations! You have chosen to help yourself be healthier and live longer! There are lots of different ways to quit smoking. Nicotine gum, nicotine patches, a nicotine inhaler, or nicotine nasal spray can help with physical craving. Hypnosis, support groups, and medicines help break the habit of smoking. TIPS TO GET OFF AND STAY OFF CIGARETTES  Learn to predict your moods. Do not let a bad situation be your excuse to have a cigarette. Some situations in your life might tempt you to have a cigarette.  Ask friends and co-workers not to smoke around you.  Make your home smoke-free.  Never have "just one" cigarette. It leads to wanting another and another. Remind yourself of your decision to quit.  On a card, make a list of your reasons for not smoking. Read it at least the same number of times a day as you have a cigarette. Tell yourself everyday, "I do not want to smoke. I choose not to smoke."  Ask someone at home or work to help you with your plan to quit smoking.  Have something planned after you eat or have a cup of coffee. Take a walk or get other exercise to perk you up. This will help to keep you from overeating.  Try a relaxation exercise to calm you down and decrease your stress. Remember, you may be tense and nervous the first two weeks after you quit. This will pass.  Find new activities to keep your hands  busy. Play  with a pen, coin, or rubber band. Doodle or draw things on paper.  Brush your teeth right after eating. This will help cut down the craving for the taste of tobacco after meals. You can try mouthwash too.  Try gum, breath mints, or diet candy to keep something in your mouth. IF YOU SMOKE AND WANT TO QUIT:  Do not stock up on cigarettes. Never buy a carton. Wait until one pack is finished before you buy another.  Never carry cigarettes with you at work or at home.  Keep cigarettes as far away from you as possible. Leave them with someone else.  Never carry matches or a lighter with you.  Ask yourself, "Do I need this cigarette or is this just a reflex?"  Bet with someone that you can quit. Put cigarette money in a piggy bank every morning. If you smoke, you give up the money. If you do not smoke, by the end of the week, you keep the money.  Keep trying. It takes 21 days to change a habit!  Talk to your doctor about using medicines to help you quit. These include nicotine replacement gum, lozenges, or skin patches. Document Released: 08/11/2009 Document Revised: 01/07/2012 Document Reviewed: 08/11/2009 [facility] Patient Information 2015 [place], [place]. This information is not intended to replace advice given to you by your health care provider. Make sure you discuss any questions you have with your health care provider.

## 2014-07-01 NOTE — Progress Notes (Signed)
CARDIAC REHAB PHASE I   PRE:  Rate/Rhythm: 78 SR  BP:  Supine: 136/92  Sitting:   Standing:    SaO2: 94 RA  MODE:  Ambulation: 1000 ft   POST:  Rate/Rhythm: 78  BP:  Supine:   Sitting: 155/98  Standing:    SaO2: 97 RA 1025-1120 Pt tolerated ambulation well without c/o of cp or SOB. BP up before and after walk. Pt to recliner after walker with call light in reach. Completed discharge education with pt. We discussed angina proper use of sl NTG, CHF zones, sodium restrictions and when to call MD and 911. He voices understanding. Pt declines Outpt. CRP, wants to do his exercise at home.  Rodney Langton RN 07/01/2014 11:20 AM

## 2014-07-01 NOTE — Discharge Summary (Signed)
CARDIOLOGY DISCHARGE SUMMARY   Patient ID: Daniel Li MRN: [id number] DOB/AGE: 58-Aug-1957 58 y.o.  Admit date: 06/29/2014 Discharge date: 07/01/2014  PCP: Rosita Fire, MD Primary Cardiologist: Dr. Harl Bowie  Primary Discharge Diagnosis:  Unstable angina - medical therapy for CAD  Secondary Discharge Diagnosis:    Diabetes mellitus type 2 with complications   Hypertension, essential   Hypercholesterolemia   Tobacco abuse  Procedures: Cardiac catheterization, coronary angiogram, left ventriculogram  Hospital Course: Daniel Li is a 58 y.o. male with a history of CAD. He had intermittent substernal chest pain and came to  [facility] where he was admitted for further evaluation and treatment. His cardiac enzymes were negative for MI, but a stress test was abnormal with an EF of 30% and inferolateral ischemia. He was transferred to [facility] for Catheterization.  He has known significant coronary artery disease with multiple stents in the past. Cardiac catheterization results are below. His anatomy had no lesions amenable to percutaneous intervention and was not significantly changed from his previous catheterization. Medical therapy was recommended. Imdur was added to his medication regimen. A lipid profile was performed which showed significant hyperlipidemia. He also had a statin added to his medication regimen.  On 09/03, he was seen by Dr. Claiborne Billings and all data were reviewed. He was tolerating the medications well and ambulating without chest pain or shortness of breath. No further inpatient workup was indicated and he is considered stable for discharge, to follow up as an outpatient.   Labs:   Lab Results  Component Value Date   WBC 5.9 06/30/2014   HGB 14.0 06/30/2014   HCT 42.8 06/30/2014   MCV 85.1 06/30/2014   PLT 182 06/30/2014     Recent Labs Lab 06/30/14 0350  NA 143  K 4.5  CL 105  CO2 28  BUN 10  CREATININE 1.07  CALCIUM 9.1  PROT 6.5    BILITOT 0.3  ALKPHOS 59  ALT 23  AST 21  GLUCOSE 122*   Lipid Panel     Component Value Date/Time   CHOL 242* 06/30/2014 0350   TRIG 111 06/30/2014 0350   HDL 58 06/30/2014 0350   CHOLHDL 4.2 06/30/2014 0350   VLDL 22 06/30/2014 0350   LDLCALC 162* 06/30/2014 0350    Recent Labs  06/30/14 0350  INR 1.31   Cardiac Cath: 06/30/2014 Angiographic Findings:  Left main: No obstructive disease.  Left Anterior Descending Artery: Moderate caliber vessel that courses to the apex. The proximal vessel has serial 30% stenoses. The mid vessel has an eccentric 40% stenosis just beyond the takeoff of the diagonal branch. There is a smooth irregularity which is unchanged from last cath in 2014. The distal LAD has a 40% focal stenosis. The diagonal branch is moderate in caliber with patent proximal stent. The mid portion of the diagonal branch has a 60-70% stenosis (1.75 mm vessel). This is unchanged from last cath.  Circumflex Artery: 100% proximal occlusion. (chronic). The mid and distal vessel fills from left to left collaterals.  Right Coronary Artery: Large, dominant vessel with patent proximal stent. The stented segment has no restenosis. The distal vessel has serial 30-40% stenoses. The PDA is moderate in caliber and has a focal 50% stenosis, unchanged. The Posterolateral branch has a sub-total occlusion and fills from right to right collaterals.(This is known from prior cath)  Left Ventricular Angiogram: LVEF=35%  Impression:  1. Triple vessel CAD. His coronary anatomy is grossly unchanged from cath in January  2015. He has diffuse disease in his distal RCA that is not amenable to PCI. His Circumflex is chronically occluded and fills from collaterals. The LAD has mild to moderate non-obstructive disease. There is moderate disease in the diagonal branch and the PDA.  2. Moderate LV systolic dysfunction  Recommendations: Will continue medical management. I will add Imdur.   EKG: 07/01/2014 Sinus rhythm,  inferolateral T-wave inversions Vent. rate 71 BPM PR interval 186 ms QRS duration 96 ms QT/QTc 406/441 ms P-R-T axes 51 13 201  FOLLOW UP PLANS AND APPOINTMENTS Allergies  Allergen Reactions  . Pork-Derived Products Other (See Comments)    Does not eat for religious reasons     Medication List         albuterol 108 (90 BASE) MCG/ACT inhaler  Commonly known as:  PROVENTIL HFA;VENTOLIN HFA  Inhale 2 puffs into the lungs 2 (two) times daily as needed for wheezing or shortness of breath.     aspirin EC 81 MG tablet  Take 81 mg by mouth daily.     atorvastatin 80 MG tablet  Commonly known as:  LIPITOR  Take 1 tablet (80 mg total) by mouth daily at 6 PM.     cyclobenzaprine 10 MG tablet  Commonly known as:  FLEXERIL  Take 10 mg by mouth 2 (two) times daily as needed for muscle spasms.     gabapentin 100 MG capsule  Commonly known as:  NEURONTIN  Take 100 mg by mouth 3 (three) times daily.     glipiZIDE 10 MG 24 hr tablet  Commonly known as:  GLUCOTROL XL  Take 1 tablet (10 mg total) by mouth daily.     hydrochlorothiazide 25 MG tablet  Commonly known as:  HYDRODIURIL  Take 1 tablet (25 mg total) by mouth daily.     isosorbide mononitrate 30 MG 24 hr tablet  Commonly known as:  IMDUR  Take 1 tablet (30 mg total) by mouth daily.     lisinopril 20 MG tablet  Commonly known as:  PRINIVIL,ZESTRIL  Take 1 tablet (20 mg total) by mouth daily.     metoprolol tartrate 25 MG tablet  Commonly known as:  LOPRESSOR  Take 1.5 tablets (37.5 mg total) by mouth 2 (two) times daily.     nitroGLYCERIN 0.4 MG SL tablet  Commonly known as:  NITROSTAT  Place 0.4 mg under the tongue every 5 (five) minutes as needed for chest pain (up to 3 doses).     traMADol 50 MG tablet  Commonly known as:  ULTRAM  Take 50 mg by mouth 2 (two) times daily as needed for moderate pain.        Discharge Instructions   Diet - low sodium heart healthy    Complete by:  As directed      Diet Carb  Modified    Complete by:  As directed      Increase activity slowly    Complete by:  As directed           Follow-up Information   Follow up with Arnoldo Lenis, MD On 07/13/2014. (At 3:00 PM)    Specialty:  Cardiology   Contact information:   [place] [address] [phone]       BRING ALL MEDICATIONS WITH YOU TO FOLLOW UP APPOINTMENTS  Time spent with patient to include physician time: 43 min Signed: Rosaria Ferries, PA-C 07/01/2014, 1:14 PM Co-Sign MD

## 2014-07-01 NOTE — Progress Notes (Signed)
Patient Name: Daniel Li Date of Encounter: 07/01/2014  Principal Problem:   Unstable angina Active Problems:   Diabetes mellitus type 2 with complications   Hypertension, essential   Hypercholesterolemia   Tobacco abuse    Patient Profile: 58 yo AA male with a history of HTN, HLD, DM type 2, tobacco abuse, multiple CVAs, chronic diastolic CHF, CAD s/p BMS to 1st OM & RCA and occlusion of the LCx admitted on 06/29/14 for chest pain.   SUBJECTIVE: No chest pain or palpations overnight. Was able to sleep flat all night. Awoke once last nigh with SOB, but resolved within one minute while he continued to lay flat.   OBJECTIVE Filed Vitals:   06/30/14 1745 06/30/14 1830 06/30/14 1959 07/01/14 0500  BP: 147/107 149/102 131/85 138/78  Pulse: 84 82 86 70  Temp:   98.5 F (36.9 C) 98.1 F (36.7 C)  TempSrc:   Oral Oral  Resp: 14 14 16 18   Height:      Weight:    173 lb 3.2 oz (78.563 kg)  SpO2: 91% 94% 95% 96%    Intake/Output Summary (Last 24 hours) at 07/01/14 0917 Last data filed at 06/30/14 1626  Gross per 24 hour  Intake 758.75 ml  Output      0 ml  Net 758.75 ml   Filed Weights   06/29/14 1827 06/30/14 0454 07/01/14 0500  Weight: 174 lb (78.926 kg) 189 lb (85.73 kg) 173 lb 3.2 oz (78.563 kg)    PHYSICAL EXAM General: Well developed, well nourished, male in no acute distress. Head: Normocephalic, atraumatic.  Neck: Supple without bruits, JVD not elevated. Lungs:  Resp regular and unlabored, few basilar crackles. Heart: RRR, S1, S2, no S3, S4, or murmur; no rub. Abdomen: Soft, non-tender, non-distended, BS + x 4.  Extremities: No clubbing, cyanosis, no edema.  Neuro: Alert and oriented X 3. Moves all extremities spontaneously. Psych: Normal affect.  LABS: CBC: Recent Labs  06/30/14 0350  WBC 5.9  HGB 14.0  HCT 42.8  MCV 85.1  PLT 182   INR: Recent Labs  06/30/14 0350  INR 8.29   Basic Metabolic Panel: Recent Labs  06/30/14 0350  NA  143  K 4.5  CL 105  CO2 28  GLUCOSE 122*  BUN 10  CREATININE 1.07  CALCIUM 9.1   Liver Function Tests: Recent Labs  06/30/14 0350  AST 21  ALT 23  ALKPHOS 59  BILITOT 0.3  PROT 6.5  ALBUMIN 3.4*   Fasting Lipid Panel: Recent Labs  06/30/14 0350  CHOL 242*  HDL 58  LDLCALC 162*  TRIG 111  CHOLHDL 4.2   TELE:   SR with rate in the 80s with occasional PVCs. No significant ectopy.     Cardiac Catheterization: 9/02 Dr. Angelena Form  Hemodynamic Findings:  Central aortic pressure: 131/86  Left ventricular pressure: 132/14/20  Angiographic Findings:  Left main: No obstructive disease.  Left Anterior Descending Artery: Moderate caliber vessel that courses to the apex. The proximal vessel has serial 30% stenoses. The mid vessel has an eccentric 40% stenosis just beyond the takeoff of the diagonal branch. There is a smooth irregularity which is unchanged from last cath in 2014. The distal LAD has a 40% focal stenosis. The diagonal branch is moderate in caliber with patent proximal stent. The mid portion of the diagonal branch has a 60-70% stenosis (1.75 mm vessel). This is unchanged from last cath.  Circumflex Artery: 100% proximal occlusion. (chronic). The mid  and distal vessel fills from left to left collaterals.  Right Coronary Artery: Large, dominant vessel with patent proximal stent. The stented segment has no restenosis. The distal vessel has serial 30-40% stenoses. The PDA is moderate in caliber and has a focal 50% stenosis, unchanged. The Posterolateral branch has a sub-total occlusion and fills from right to right collaterals.(This is known from prior cath)  Left Ventricular Angiogram: LVEF=35%  Impression:  1. Triple vessel CAD. His coronary anatomy is grossly unchanged from cath in January 2015. He has diffuse disease in his distal RCA that is not amenable to PCI. His Circumflex is chronically occluded and fills from collaterals. The LAD has mild to moderate non-obstructive  disease. There is moderate disease in the diagonal branch and the PDA.  2. Moderate LV systolic dysfunction  Recommendations: Will continue medical management. I will add Imdur.    Current Medications:  . aspirin EC  81 mg Oral Daily  . atorvastatin  80 mg Oral q1800  . gabapentin  100 mg Oral TID  . glipiZIDE  10 mg Oral QAC breakfast  . insulin aspart  0-15 Units Subcutaneous TID WC  . isosorbide mononitrate  30 mg Oral Daily  . lisinopril  20 mg Oral Daily  . metoprolol tartrate  25 mg Oral BID  . nicotine  14 mg Transdermal Q24H  . pneumococcal 23 valent vaccine  0.5 mL Intramuscular Tomorrow-1000      ASSESSMENT AND PLAN: Unstable angina: Stress testing 06/29/2014 which revealed a large area of reversibility in the inferolateral wall. He has known prior history of right coronary artery stenting as well as an occluded left circumflex based on catheterization in 2014. Cardiac Cath on 9/02 shows triple vessel CAD and moderate LV systolic dysfunction (see study results above). He is currently chest pain-free. Continue ASA, BB, ACE, statin. Dr. Angelena Form added Imdur 30mg  on 9/02 following cath. Possibly d/c today, advise per MD.   HTN: Stable. On BB, ACEI, Imdur.   HLD: Cholesterol and LDL high. On Lipitor 80 mg, new per this hospitalization.  DM: Cont glipizide and SSI.   ? New ICM: Ef 30% on spect. Prev nl in 10/2012. Will repeat echo, timing per MD.  Euvolemic on exam. Cont BB/ACE. Consider low dose Diuretic (Cr 1.05), MD to advise.    Tob Abuse: Cessation advised.  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 On home rx.   Signed,  Lucy Antigua, PA-S2  Seen and agree, with changes made. Rosaria Ferries , PA-C 9:17 AM 07/01/2014  Patient seen and examined. Agree with assessment and plan. Cath data reviewed. Ischemia on nuclear study related to old LCx occlusion and diffuse distal RCA disease. Imdur added at 30 mg ,  would titrate to 60 mg as outpatient. Increase lopressor to 37.5 mg bid and titrate as outpatient. If patient continues to have recurrent angina consider addition of ranolazine to medical regimen or amlodipine. Plan DC later today and f/u in [place].   Troy Sine, MD, [facility] 07/01/2014 10:35 AM

## 2014-07-13 ENCOUNTER — Ambulatory Visit (INDEPENDENT_AMBULATORY_CARE_PROVIDER_SITE_OTHER): Payer: Medicaid Other | Admitting: Cardiology

## 2014-07-13 ENCOUNTER — Encounter: Payer: Self-pay | Admitting: Cardiology

## 2014-07-13 VITALS — BP 133/76 | HR 80 | Ht 66.0 in | Wt 194.8 lb

## 2014-07-13 DIAGNOSIS — I519 Heart disease, unspecified: Secondary | ICD-10-CM

## 2014-07-13 DIAGNOSIS — R404 Transient alteration of awareness: Secondary | ICD-10-CM

## 2014-07-13 DIAGNOSIS — I2510 Atherosclerotic heart disease of native coronary artery without angina pectoris: Secondary | ICD-10-CM | POA: Diagnosis not present

## 2014-07-13 DIAGNOSIS — R400 Somnolence: Secondary | ICD-10-CM

## 2014-07-13 MED ORDER — NITROGLYCERIN 0.4 MG SL SUBL: 0.4000 mg | SUBLINGUAL_TABLET | SUBLINGUAL | Status: DC | PRN
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
## 2014-07-13 NOTE — Patient Instructions (Addendum)
   Refill sent for your Nitroglycerin There were no changes to your medications. Continue as directed. Your physician has requested that you have an echocardiogram. Echocardiography is a painless test that uses sound waves to create images of your heart. It provides your doctor with information about the size and shape of your heart and how well your heart's chambers and valves are working. This procedure takes approximately one hour. There are no restrictions for this procedure. Referral sent for Sleep Study. Dr. Laqueta Linden' office will contact you to schedule. Your physician has recommended that you have a sleep study. This test records several body functions during sleep, including: brain activity, eye movement, oxygen and carbon dioxide blood levels, heart rate and rhythm, breathing rate and rhythm, the flow of air through your mouth and nose, snoring, body muscle movements, and chest and belly movement. Office will contact with results via phone or letter.   Your physician wants you to follow up in:  3 months.

## 2014-07-19 ENCOUNTER — Encounter (HOSPITAL_COMMUNITY): Payer: Self-pay | Admitting: Emergency Medicine

## 2014-07-19 ENCOUNTER — Inpatient Hospital Stay (HOSPITAL_COMMUNITY)
Admission: EM | Admit: 2014-07-19 | Discharge: 2014-07-21 | DRG: 293 | Disposition: A | Discharge status: Home Health | Payer: Medicaid Other | Attending: Internal Medicine | Admitting: Internal Medicine

## 2014-07-19 ENCOUNTER — Emergency Department (HOSPITAL_COMMUNITY): Payer: Medicaid Other

## 2014-07-19 ENCOUNTER — Inpatient Hospital Stay (HOSPITAL_COMMUNITY): Payer: Medicaid Other

## 2014-07-19 DIAGNOSIS — Z7982 Long term (current) use of aspirin: Secondary | ICD-10-CM | POA: Diagnosis not present

## 2014-07-19 DIAGNOSIS — F411 Generalized anxiety disorder: Secondary | ICD-10-CM | POA: Diagnosis present

## 2014-07-19 DIAGNOSIS — E1142 Type 2 diabetes mellitus with diabetic polyneuropathy: Secondary | ICD-10-CM | POA: Diagnosis present

## 2014-07-19 DIAGNOSIS — I509 Heart failure, unspecified: Secondary | ICD-10-CM | POA: Diagnosis present

## 2014-07-19 DIAGNOSIS — Z79899 Other long term (current) drug therapy: Secondary | ICD-10-CM | POA: Diagnosis not present

## 2014-07-19 DIAGNOSIS — R071 Chest pain on breathing: Secondary | ICD-10-CM | POA: Diagnosis present

## 2014-07-19 DIAGNOSIS — R079 Chest pain, unspecified: Secondary | ICD-10-CM | POA: Diagnosis present

## 2014-07-19 DIAGNOSIS — F3289 Other specified depressive episodes: Secondary | ICD-10-CM | POA: Diagnosis present

## 2014-07-19 DIAGNOSIS — K219 Gastro-esophageal reflux disease without esophagitis: Secondary | ICD-10-CM | POA: Diagnosis present

## 2014-07-19 DIAGNOSIS — F17200 Nicotine dependence, unspecified, uncomplicated: Secondary | ICD-10-CM | POA: Diagnosis present

## 2014-07-19 DIAGNOSIS — I2510 Atherosclerotic heart disease of native coronary artery without angina pectoris: Secondary | ICD-10-CM | POA: Diagnosis present

## 2014-07-19 DIAGNOSIS — E1149 Type 2 diabetes mellitus with other diabetic neurological complication: Secondary | ICD-10-CM | POA: Diagnosis present

## 2014-07-19 DIAGNOSIS — J189 Pneumonia, unspecified organism: Secondary | ICD-10-CM | POA: Insufficient documentation

## 2014-07-19 DIAGNOSIS — I10 Essential (primary) hypertension: Secondary | ICD-10-CM | POA: Diagnosis present

## 2014-07-19 DIAGNOSIS — M129 Arthropathy, unspecified: Secondary | ICD-10-CM | POA: Diagnosis present

## 2014-07-19 DIAGNOSIS — I252 Old myocardial infarction: Secondary | ICD-10-CM

## 2014-07-19 DIAGNOSIS — I429 Cardiomyopathy, unspecified: Secondary | ICD-10-CM

## 2014-07-19 DIAGNOSIS — F1010 Alcohol abuse, uncomplicated: Secondary | ICD-10-CM | POA: Diagnosis present

## 2014-07-19 DIAGNOSIS — I5043 Acute on chronic combined systolic (congestive) and diastolic (congestive) heart failure: Secondary | ICD-10-CM | POA: Diagnosis present

## 2014-07-19 DIAGNOSIS — I200 Unstable angina: Secondary | ICD-10-CM

## 2014-07-19 DIAGNOSIS — Z1211 Encounter for screening for malignant neoplasm of colon: Secondary | ICD-10-CM

## 2014-07-19 DIAGNOSIS — E785 Hyperlipidemia, unspecified: Secondary | ICD-10-CM | POA: Diagnosis present

## 2014-07-19 DIAGNOSIS — Z720 Tobacco use: Secondary | ICD-10-CM | POA: Diagnosis present

## 2014-07-19 DIAGNOSIS — I2589 Other forms of chronic ischemic heart disease: Secondary | ICD-10-CM | POA: Diagnosis present

## 2014-07-19 DIAGNOSIS — Z86718 Personal history of other venous thrombosis and embolism: Secondary | ICD-10-CM

## 2014-07-19 DIAGNOSIS — Z9861 Coronary angioplasty status: Secondary | ICD-10-CM

## 2014-07-19 DIAGNOSIS — M549 Dorsalgia, unspecified: Secondary | ICD-10-CM | POA: Diagnosis present

## 2014-07-19 DIAGNOSIS — Z8673 Personal history of transient ischemic attack (TIA), and cerebral infarction without residual deficits: Secondary | ICD-10-CM | POA: Diagnosis not present

## 2014-07-19 DIAGNOSIS — G8929 Other chronic pain: Secondary | ICD-10-CM | POA: Diagnosis present

## 2014-07-19 DIAGNOSIS — I255 Ischemic cardiomyopathy: Secondary | ICD-10-CM | POA: Diagnosis present

## 2014-07-19 DIAGNOSIS — E7800 Pure hypercholesterolemia, unspecified: Secondary | ICD-10-CM | POA: Diagnosis present

## 2014-07-19 DIAGNOSIS — F329 Major depressive disorder, single episode, unspecified: Secondary | ICD-10-CM | POA: Diagnosis present

## 2014-07-19 DIAGNOSIS — E118 Type 2 diabetes mellitus with unspecified complications: Secondary | ICD-10-CM | POA: Diagnosis present

## 2014-07-19 DIAGNOSIS — R0789 Other chest pain: Secondary | ICD-10-CM

## 2014-07-19 DIAGNOSIS — I5042 Chronic combined systolic (congestive) and diastolic (congestive) heart failure: Secondary | ICD-10-CM

## 2014-07-19 HISTORY — DX: Cardiomyopathy, unspecified: I42.9

## 2014-07-19 LAB — CBC
HCT: 41.7 % (ref 39.0–52.0)
Hemoglobin: 14.1 g/dL (ref 13.0–17.0)
MCH: 27.4 pg (ref 26.0–34.0)
MCHC: 33.8 g/dL (ref 30.0–36.0)
MCV: 81.1 fL (ref 78.0–100.0)
Platelets: 184 10*3/uL (ref 150–400)
RBC: 5.14 MIL/uL (ref 4.22–5.81)
RDW: 13.9 % (ref 11.5–15.5)
WBC: 11.9 10*3/uL — ABNORMAL HIGH (ref 4.0–10.5)

## 2014-07-19 LAB — COMPREHENSIVE METABOLIC PANEL
ALT: 20 U/L (ref 0–53)
AST: 26 U/L (ref 0–37)
Albumin: 3.6 g/dL (ref 3.5–5.2)
Alkaline Phosphatase: 65 U/L (ref 39–117)
Anion gap: 14 (ref 5–15)
BUN: 13 mg/dL (ref 6–23)
CALCIUM: 9 mg/dL (ref 8.4–10.5)
CO2: 23 mEq/L (ref 19–32)
Chloride: 102 mEq/L (ref 96–112)
Creatinine, Ser: 0.87 mg/dL (ref 0.50–1.35)
GFR calc Af Amer: >90 mL/min (ref >90)
GFR calc non Af Amer: >90 mL/min (ref >90)
Glucose, Bld: 111 mg/dL — ABNORMAL HIGH (ref 70–99)
POTASSIUM: 3.8 meq/L (ref 3.7–5.3)
SODIUM: 139 meq/L (ref 137–147)
TOTAL PROTEIN: 6.7 g/dL (ref 6.0–8.3)
Total Bilirubin: 0.3 mg/dL (ref 0.3–1.2)

## 2014-07-19 LAB — TROPONIN I

## 2014-07-19 LAB — I-STAT TROPONIN, ED: TROPONIN I, POC: 0 ng/mL (ref 0.00–0.08)

## 2014-07-19 LAB — D-DIMER, QUANTITATIVE: D-Dimer, Quant: 0.54 ug/mL-FEU — ABNORMAL HIGH (ref 0.00–0.48)

## 2014-07-19 LAB — GLUCOSE, CAPILLARY: GLUCOSE-CAPILLARY: 189 mg/dL — AB (ref 70–99)

## 2014-07-19 LAB — PRO B NATRIURETIC PEPTIDE: Pro B Natriuretic peptide (BNP): 209 pg/mL — ABNORMAL HIGH (ref 0–125)

## 2014-07-19 LAB — MRSA PCR SCREENING: MRSA BY PCR: NEGATIVE

## 2014-07-19 MED ORDER — GABAPENTIN 100 MG PO CAPS
100.0000 mg | ORAL_CAPSULE | Freq: Three times a day (TID) | ORAL | Status: DC
  Administered 2014-07-19 – 2014-07-21 (×5): 100 mg via ORAL
  Filled 2014-07-19 (×7): qty 1

## 2014-07-19 MED ORDER — ACETAMINOPHEN 325 MG PO TABS: 650.0000 mg | ORAL_TABLET | ORAL | Status: DC | PRN

## 2014-07-19 MED ORDER — NITROGLYCERIN 0.4 MG SL SUBL: 0.4000 mg | SUBLINGUAL_TABLET | SUBLINGUAL | Status: DC | PRN

## 2014-07-19 MED ORDER — DEXTROSE 5 % IV SOLN
1.0000 g | Freq: Once | INTRAVENOUS | Status: DC
  Administered 2014-07-19: 1 g via INTRAVENOUS
  Filled 2014-07-19: qty 1

## 2014-07-19 MED ORDER — ALBUTEROL SULFATE HFA 108 (90 BASE) MCG/ACT IN AERS: 2.0000 | INHALATION_SPRAY | Freq: Two times a day (BID) | RESPIRATORY_TRACT | Status: DC | PRN

## 2014-07-19 MED ORDER — ALBUTEROL SULFATE (2.5 MG/3ML) 0.083% IN NEBU: 2.5000 mg | INHALATION_SOLUTION | Freq: Two times a day (BID) | RESPIRATORY_TRACT | Status: DC | PRN

## 2014-07-19 MED ORDER — ATORVASTATIN CALCIUM 80 MG PO TABS
80.0000 mg | ORAL_TABLET | Freq: Every day | ORAL | Status: DC
  Administered 2014-07-19 – 2014-07-20 (×2): 80 mg via ORAL
  Filled 2014-07-19 (×3): qty 1

## 2014-07-19 MED ORDER — TRAMADOL HCL 50 MG PO TABS
50.0000 mg | ORAL_TABLET | Freq: Two times a day (BID) | ORAL | Status: DC | PRN
  Administered 2014-07-20: 50 mg via ORAL
  Filled 2014-07-19: qty 1

## 2014-07-19 MED ORDER — ASPIRIN 81 MG PO CHEW: 324.0000 mg | CHEWABLE_TABLET | Freq: Once | ORAL | Status: DC

## 2014-07-19 MED ORDER — METOPROLOL TARTRATE 25 MG PO TABS
37.5000 mg | ORAL_TABLET | Freq: Two times a day (BID) | ORAL | Status: DC
  Administered 2014-07-19 – 2014-07-21 (×4): 37.5 mg via ORAL
  Filled 2014-07-19 (×5): qty 1

## 2014-07-19 MED ORDER — VANCOMYCIN HCL IN DEXTROSE 750-5 MG/150ML-% IV SOLN
750.0000 mg | Freq: Three times a day (TID) | INTRAVENOUS | Status: DC
  Administered 2014-07-19 – 2014-07-20 (×3): 750 mg via INTRAVENOUS
  Filled 2014-07-19 (×5): qty 150

## 2014-07-19 MED ORDER — HYDROMORPHONE HCL 1 MG/ML IJ SOLN
1.0000 mg | INTRAMUSCULAR | Status: AC | PRN
Start: 2014-07-19 — End: 2014-07-20
  Administered 2014-07-19 – 2014-07-20 (×2): 1 mg via INTRAVENOUS
  Filled 2014-07-19 (×2): qty 1

## 2014-07-19 MED ORDER — HEPARIN SODIUM (PORCINE) 5000 UNIT/ML IJ SOLN
5000.0000 [IU] | Freq: Three times a day (TID) | INTRAMUSCULAR | Status: DC
  Administered 2014-07-19 – 2014-07-21 (×6): 5000 [IU] via SUBCUTANEOUS
  Filled 2014-07-19 (×8): qty 1

## 2014-07-19 MED ORDER — DEXTROSE 5 % IV SOLN
2.0000 g | Freq: Two times a day (BID) | INTRAVENOUS | Status: DC
  Administered 2014-07-19 – 2014-07-20 (×3): 2 g via INTRAVENOUS
  Filled 2014-07-19 (×4): qty 2

## 2014-07-19 MED ORDER — ASPIRIN EC 81 MG PO TBEC
81.0000 mg | DELAYED_RELEASE_TABLET | Freq: Every day | ORAL | Status: DC
  Administered 2014-07-20 – 2014-07-21 (×2): 81 mg via ORAL
  Filled 2014-07-19 (×2): qty 1

## 2014-07-19 MED ORDER — MORPHINE SULFATE 2 MG/ML IJ SOLN
2.0000 mg | INTRAMUSCULAR | Status: DC | PRN
  Administered 2014-07-20 (×2): 2 mg via INTRAVENOUS
  Filled 2014-07-19 (×2): qty 1

## 2014-07-19 MED ORDER — CYCLOBENZAPRINE HCL 10 MG PO TABS: 10.0000 mg | ORAL_TABLET | Freq: Two times a day (BID) | ORAL | Status: DC | PRN

## 2014-07-19 MED ORDER — ONDANSETRON HCL 4 MG/2ML IJ SOLN: 4.0000 mg | Freq: Four times a day (QID) | INTRAMUSCULAR | Status: DC | PRN

## 2014-07-19 MED ORDER — ISOSORBIDE MONONITRATE ER 30 MG PO TB24
30.0000 mg | ORAL_TABLET | Freq: Every day | ORAL | Status: DC
  Administered 2014-07-20 – 2014-07-21 (×2): 30 mg via ORAL
  Filled 2014-07-19 (×2): qty 1

## 2014-07-19 MED ORDER — MORPHINE SULFATE 4 MG/ML IJ SOLN
4.0000 mg | Freq: Once | INTRAMUSCULAR | Status: AC
  Administered 2014-07-19: 4 mg via INTRAVENOUS
  Filled 2014-07-19: qty 1

## 2014-07-19 MED ORDER — INSULIN ASPART 100 UNIT/ML ~~LOC~~ SOLN
0.0000 [IU] | Freq: Three times a day (TID) | SUBCUTANEOUS | Status: DC
  Administered 2014-07-20: 1 [IU] via SUBCUTANEOUS

## 2014-07-19 MED ORDER — METHOCARBAMOL 500 MG PO TABS
500.0000 mg | ORAL_TABLET | Freq: Three times a day (TID) | ORAL | Status: DC | PRN
  Filled 2014-07-19: qty 1

## 2014-07-19 MED ORDER — FUROSEMIDE 10 MG/ML IJ SOLN
40.0000 mg | Freq: Once | INTRAMUSCULAR | Status: AC
  Administered 2014-07-19: 40 mg via INTRAVENOUS
  Filled 2014-07-19: qty 4
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
## 2014-07-19 NOTE — ED Notes (Signed)
Pt retrieved from X-ray to take to room.  Using urinal.
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
## 2014-07-19 NOTE — Progress Notes (Signed)
Report received from ED nurse Janett Billow for patient to be admitted into 5w17

## 2014-07-19 NOTE — ED Notes (Signed)
Bed change from telemetry to step down, flow and charge nurse notified.

## 2014-07-19 NOTE — ED Notes (Signed)
Attempted to give report.  RN Justice Rocher unable to take report.  Charge RN too busy with 4 patients.

## 2014-07-19 NOTE — ED Notes (Signed)
Pt comes in via [facility], with CP 10/10 no SOB, no N/V. PTA pt took two nitro, then received three more via EMS, 3mg  of morphine and 324 of ASA. Unrelieved pain.
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
## 2014-07-19 NOTE — ED Provider Notes (Signed)
Pt seen and evaluated.  C/O CP across entire anterior chest, into B neck and BUE.  Worse with movement, breathing. Onset 1 hr ago sitting "watching TV".  Cough for "a few weeks".  "shakes" on the way over c EMS.  No documented fever at home, afebrile here.  No relief with NTG x 5, and IV MS per EMS.  History of heart disease, with recent admit, and unchanged cath.  9/2 Per Cr. [place]Al[place]: Left Ventricular Angiogram: LVEF=35%  Impression:  1. Triple vessel CAD. His coronary anatomy is grossly unchanged from cath in January 2015. He has diffuse disease in his distal RCA that is not amenable to PCI. His Circumflex is chronically occluded and fills from collaterals. The LAD has mild to moderate non-obstructive disease. There is moderate disease in the diagonal branch and the PDA.  2. Moderate LV systolic dysfunction  Recommendations: Will continue medical management. I will add Imdur.   EKG today with inferior and lateral T inversions without change. First Troponin normal.  CXR with [place] of Lt hemidiaphragm.  WBC 11.9.    CP both typical and atypical features. Recent admit with rule out, and cath with DZ only ammenable to medical management. Will give antibiotics, run enzymes, d/w cardiology.   Tanna Furry, MD 07/19/14 [phone]

## 2014-07-20 ENCOUNTER — Encounter (HOSPITAL_COMMUNITY): Payer: Self-pay | Admitting: *Deleted

## 2014-07-20 ENCOUNTER — Inpatient Hospital Stay (HOSPITAL_COMMUNITY): Payer: Medicaid Other

## 2014-07-20 DIAGNOSIS — I255 Ischemic cardiomyopathy: Secondary | ICD-10-CM | POA: Diagnosis present

## 2014-07-20 DIAGNOSIS — I5042 Chronic combined systolic (congestive) and diastolic (congestive) heart failure: Secondary | ICD-10-CM

## 2014-07-20 DIAGNOSIS — I509 Heart failure, unspecified: Secondary | ICD-10-CM

## 2014-07-20 DIAGNOSIS — F1010 Alcohol abuse, uncomplicated: Secondary | ICD-10-CM

## 2014-07-20 DIAGNOSIS — Z86718 Personal history of other venous thrombosis and embolism: Secondary | ICD-10-CM

## 2014-07-20 DIAGNOSIS — J189 Pneumonia, unspecified organism: Secondary | ICD-10-CM

## 2014-07-20 DIAGNOSIS — I10 Essential (primary) hypertension: Secondary | ICD-10-CM

## 2014-07-20 DIAGNOSIS — I059 Rheumatic mitral valve disease, unspecified: Secondary | ICD-10-CM

## 2014-07-20 DIAGNOSIS — E785 Hyperlipidemia, unspecified: Secondary | ICD-10-CM

## 2014-07-20 DIAGNOSIS — R071 Chest pain on breathing: Secondary | ICD-10-CM

## 2014-07-20 DIAGNOSIS — I428 Other cardiomyopathies: Secondary | ICD-10-CM

## 2014-07-20 DIAGNOSIS — E7800 Pure hypercholesterolemia, unspecified: Secondary | ICD-10-CM

## 2014-07-20 DIAGNOSIS — F17200 Nicotine dependence, unspecified, uncomplicated: Secondary | ICD-10-CM

## 2014-07-20 DIAGNOSIS — I429 Cardiomyopathy, unspecified: Secondary | ICD-10-CM

## 2014-07-20 DIAGNOSIS — I2510 Atherosclerotic heart disease of native coronary artery without angina pectoris: Secondary | ICD-10-CM

## 2014-07-20 HISTORY — DX: Cardiomyopathy, unspecified: I42.9

## 2014-07-20 LAB — CBC WITH DIFFERENTIAL/PLATELET
BASOS ABS: 0 10*3/uL (ref 0.0–0.1)
BASOS PCT: 0 % (ref 0–1)
Eosinophils Absolute: 0 10*3/uL (ref 0.0–0.7)
Eosinophils Relative: 0 % (ref 0–5)
HEMATOCRIT: 44.7 % (ref 39.0–52.0)
HEMOGLOBIN: 15.2 g/dL (ref 13.0–17.0)
LYMPHS PCT: 17 % (ref 12–46)
Lymphs Abs: 2 10*3/uL (ref 0.7–4.0)
MCH: 27.4 pg (ref 26.0–34.0)
MCHC: 34 g/dL (ref 30.0–36.0)
MCV: 80.7 fL (ref 78.0–100.0)
MONOS PCT: 6 % (ref 3–12)
Monocytes Absolute: 0.7 10*3/uL (ref 0.1–1.0)
NEUTROS ABS: 9.3 10*3/uL — AB (ref 1.7–7.7)
Neutrophils Relative %: 77 % (ref 43–77)
Platelets: 206 10*3/uL (ref 150–400)
RBC: 5.54 MIL/uL (ref 4.22–5.81)
RDW: 13.6 % (ref 11.5–15.5)
WBC: 12 10*3/uL — AB (ref 4.0–10.5)

## 2014-07-20 LAB — TROPONIN I

## 2014-07-20 LAB — COMPREHENSIVE METABOLIC PANEL
ALBUMIN: 3.5 g/dL (ref 3.5–5.2)
ALT: 17 U/L (ref 0–53)
ANION GAP: 12 (ref 5–15)
AST: 18 U/L (ref 0–37)
Alkaline Phosphatase: 70 U/L (ref 39–117)
BUN: 12 mg/dL (ref 6–23)
CALCIUM: 9.9 mg/dL (ref 8.4–10.5)
CO2: 27 mEq/L (ref 19–32)
CREATININE: 0.87 mg/dL (ref 0.50–1.35)
Chloride: 97 mEq/L (ref 96–112)
GFR calc Af Amer: >90 mL/min (ref >90)
GFR calc non Af Amer: >90 mL/min (ref >90)
GLUCOSE: 135 mg/dL — AB (ref 70–99)
Potassium: 3.7 mEq/L (ref 3.7–5.3)
Sodium: 136 mEq/L — ABNORMAL LOW (ref 137–147)
TOTAL PROTEIN: 7.3 g/dL (ref 6.0–8.3)
Total Bilirubin: 0.7 mg/dL (ref 0.3–1.2)

## 2014-07-20 LAB — MAGNESIUM: Magnesium: 1.9 mg/dL (ref 1.5–2.5)

## 2014-07-20 LAB — GLUCOSE, CAPILLARY
GLUCOSE-CAPILLARY: 105 mg/dL — AB (ref 70–99)
GLUCOSE-CAPILLARY: 132 mg/dL — AB (ref 70–99)
GLUCOSE-CAPILLARY: 95 mg/dL (ref 70–99)
Glucose-Capillary: 111 mg/dL — ABNORMAL HIGH (ref 70–99)

## 2014-07-20 LAB — RAPID URINE DRUG SCREEN, HOSP PERFORMED
Amphetamines: NOT DETECTED
BARBITURATES: NOT DETECTED
Benzodiazepines: NOT DETECTED
Cocaine: NOT DETECTED
Opiates: POSITIVE — AB
Tetrahydrocannabinol: NOT DETECTED

## 2014-07-20 MED ORDER — IOHEXOL 350 MG/ML SOLN
100.0000 mL | Freq: Once | INTRAVENOUS | Status: AC | PRN
  Administered 2014-07-20: 100 mL via INTRAVENOUS

## 2014-07-20 MED ORDER — FUROSEMIDE 40 MG PO TABS
40.0000 mg | ORAL_TABLET | Freq: Every day | ORAL | Status: DC
  Administered 2014-07-21: 40 mg via ORAL
  Filled 2014-07-20: qty 1

## 2014-07-20 MED ORDER — METHOCARBAMOL 750 MG PO TABS
750.0000 mg | ORAL_TABLET | Freq: Three times a day (TID) | ORAL | Status: DC | PRN
  Filled 2014-07-20: qty 1

## 2014-07-20 NOTE — Consult Note (Signed)
Chart reviewed.  Patient personally interviewed and examined by me.  Patient with known 3 vessel distal CAD on medical management presents with very atypical CP.  It initially started as a sharp pain immediately after drinking water and then became a pressure.  The pain was not affected by NTG at all but did improve some with morphine.  His pain is completely reproducible with palpation of his chest wall and the patient jumps if even slight pressure applied to his right chest wall.  His WBC is elevated and chest xray is suspicious for possible PNA.  Would continue to rule out with serial enzymes.  Agree with a dose of Lasix for elevated BNP.

## 2014-07-20 NOTE — Progress Notes (Signed)
Subjective: Awaiting echo, still with 9/10 chest pain   Objective: Vital signs in last 24 hours: Temp:  [97.8 F (36.6 C)-98.7 F (37.1 C)] 98.1 F (36.7 C) (09/22 0740) Pulse Rate:  [74-91] 77 (09/22 0740) Resp:  [11-22] 21 (09/22 0740) BP: (121-151)/(87-104) 121/87 mmHg (09/22 0740) SpO2:  [93 %-98 %] 96 % (09/22 0740) Weight:  [194 lb (87.998 kg)] 194 lb (87.998 kg) (09/21 1438) Weight change:  Last BM Date: 07/20/14 Intake/Output from previous day: -2125 09/21 0701 - 09/22 0700 In: 300 [IV Piggyback:300] Out: 2425 [Urine:2425] Intake/Output this shift:    PE: General:Pleasant affect, NAD Skin:Warm and dry, brisk capillary refill HEENT:normocephalic, sclera clear, mucus membranes moist Neck:supple, no JVD, no bruits  Heart:S1S2 RRR without murmur, gallup, rub or click, + chest wall tenderness to palpation Lungs:without rales, + rhonchi, no wheezes FTD:DUKG, non tender, + BS, do not palpate liver spleen or masses Ext:no lower ext edema, 2+ pedal pulses, 2+ radial pulses Neuro:alert and oriented, MAE, follows commands, + facial symmetry  tele:  SR no ectopy Lab Results:  Recent Labs  07/19/14 1435  WBC 11.9*  HGB 14.1  HCT 41.7  PLT 184   BMET  Recent Labs  07/19/14 1435  NA 139  K 3.8  CL 102  CO2 23  GLUCOSE 111*  BUN 13  CREATININE 0.87  CALCIUM 9.0    Recent Labs  07/19/14 2238 07/20/14 0109  TROPONINI <0.30 <0.30    Lab Results  Component Value Date   CHOL 242* 06/30/2014   HDL 58 06/30/2014   LDLCALC 162* 06/30/2014   TRIG 111 06/30/2014   CHOLHDL 4.2 06/30/2014   Lab Results  Component Value Date   HGBA1C 7.0* 11/25/2012     Lab Results  Component Value Date   TSH 0.759 03/26/2012    Hepatic Function Panel  Recent Labs  07/19/14 1435  PROT 6.7  ALBUMIN 3.6  AST 26  ALT 20  ALKPHOS 65  BILITOT 0.3   No results found for this basename: CHOL,  in the last 72 hours No results found for this basename: PROTIME,  in  the last 72 hours  D dimer 0.54   Studies/Results: Dg Chest 2 View  07/19/2014   CLINICAL DATA:  Chest pain.  Shortness of breath.  EXAM: CHEST  2 VIEW  COMPARISON:  Earlier today at 1400 hr.  FINDINGS: 1823 hr. Lateral view degraded by patient arm position. Artifact degradation posteriorly on the lateral view. Midline trachea. Mild cardiomegaly. No pleural effusion or pneumothorax. lung volumes remain low, slightly improved. Mild left base subsegmental atelectasis remains. No lobar consolidation. No congestive failure.  IMPRESSION: No acute cardiopulmonary disease.  Cardiomegaly without congestive failure.  Mild subsegmental atelectasis at the left lung base. No evidence of pneumonia.   Electronically Signed   By: Abigail Miyamoto M.D.   On: 07/19/2014 18:43   Dg Chest Port 1 View  07/19/2014   CLINICAL DATA:  Chest pain, neck pain left arm pain. Five coronary artery stents placed in the past.  EXAM: PORTABLE CHEST - 1 VIEW  COMPARISON:  06/28/2014.  FINDINGS: Decreased inspiration. Stable borderline enlarged cardiac silhouette. Mildly prominent pulmonary vasculature, accentuated by the poor inspiration. Mild patchy density at the left lung base. Clear right lung. Lower thoracic spine degenerative changes. Small amount of bilateral neck calcification.  IMPRESSION: 1. Poor inspiration with mild patchy atelectasis or pneumonia in the left lower lobe. 2. Stable borderline cardiomegaly and  mild pulmonary vascular congestion. 3. Small amount of bilateral carotid artery atheromatous calcification.   Electronically Signed   By: Enrique Sack M.D.   On: 07/19/2014 15:07    Medications: I have reviewed the patient's current medications. Scheduled Meds: . aspirin  324 mg Oral Once  . aspirin EC  81 mg Oral Daily  . atorvastatin  80 mg Oral q1800  . ceFEPime (MAXIPIME) IV  2 g Intravenous Q12H  . gabapentin  100 mg Oral TID  . heparin  5,000 Units Subcutaneous 3 times per day  . insulin aspart  0-9 Units  Subcutaneous TID WC  . isosorbide mononitrate  30 mg Oral Daily  . metoprolol tartrate  37.5 mg Oral BID  . vancomycin  750 mg Intravenous Q8H   Continuous Infusions:  PRN Meds:.acetaminophen, albuterol, cyclobenzaprine, methocarbamol, morphine injection, nitroGLYCERIN, ondansetron (ZOFRAN) IV, traMADol   Assessment/Plan: Principal Problem:   Chest pain- negative MI, CXR 2 view much improved from PCXR; elevated ddimer and hx of DVTs in the past.  CTA results pending, this does not seem like cardiac pain  Active Problems:   Diabetes mellitus type 2 with complications-per IM   Hypercholesterolemia on lipitor 80 but LDL of 162, will reinforce importance of taking meds- he has not been taking   Tobacco abuse- recommend stopping- he said he stopped 3-4 days ago because the chest pain was so bad, we discussed not resuming.     Cardiomyopathy- new decrease in EF on last visit 06/30/14 to 35% by cath, awaiting echo- some HF yesterday now -2L after lasix.    CAD- + nuc study in 06/29/14 but on cath 06/30/14  No changes from cath 10/2013: 100% stenosis of LCX-chronic, LAD 30-40%, diag branch with patent stent mid portion with 60-70% stenosis in 1.75 mm vessel- and patent stent in RCA PDA with focal 50% stenosis, PLA sub-total occlusion and fills from right to right collateral. Isosorbide was added.    LOS: 1 day   Time spent with pt. :15 minutes. [facility] R  Nurse Practitioner Certified Pager [phone] or after 5pm and on weekends call [phone] 07/20/2014, 10:49 AM   I have seen and evaluated the patient this PM Cecilie Kicks, NP. I agree with her findings, examination as well as impression recommendations.  Continues to have + CP despite Neg Troponin & NEg CTA chest.  Unlikely PE or Cardiac etiology.  He did diurese well after IV Lasix x 1 -- should be on home lasix PO.  Will order PO 40mg  daily -- this will replace home HCTZ.  At this point, I do not feel that additional cardiac evaluation is  necessary.  BP & HR are stable on BB, Nitrate.  Had not been taking statin -- (was told that he doesn't need it, misunderstood).  On Abx per [facility] for ? PNA.  Will monitor & f/u on Echo.   Leonie Man, M.D., M.S. Interventional Cardiologist   Pager # [phone]

## 2014-07-20 NOTE — Progress Notes (Signed)
  Echocardiogram 2D Echocardiogram has been performed.  Mauricio Po 07/20/2014, 12:40 PM

## 2014-07-20 NOTE — Progress Notes (Signed)
[facility] TEAM 1 - Stepdown/ICU TEAM Progress Note  Daniel Li XFG:[id number] DOB: [birth year] DOA: 07/19/2014 PCP: Rosita Fire, MD  Admit HPI / Brief Narrative: 58 y/o B? 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 It felt like the pain he had exhibited from his first heart attack.  The quality pain is described as being a tightness with relief if he lays still. It is worsened if he turns in the bed if he coughs and there is a reproducible nature to the pain  Pocketing the picture however that he had a cough for the past month and he is had some sputum but he says that sputum has been greenish. He's had no ill contacts but was hospitalized for 5 days earlier this month next line he was completely fine yesterday according to sister who saw him at church he was singing walking laughing and doing well.  At the time of having the chest pain he is not doing any activity other than watching TV and had stood up to get a glass of water  Emergency room workup revealed proBNP to 9.2 troponin 0 point 30 chest x-ray portable showed poor inspiration with atelectasis/pneumonia left lower lobe stable borderline cardiomegaly mild pulmonary vascular congestion  White count 11.9 rest of his CBC normal  Renal panel completely normal-random glucose 111  EKG shows normal sinus rhythm PR interval 0.08 QRS axis 45 her inversions V3 V4 V5 which are no new  sinus rhythm.   HPI/Subjective: 9/22 continued substernal chest pain with radiation into her left arm rated at 9/10, described as sharp. Positive SOB.  Assessment/Plan: Chronic systolic and diastolic CHF/chest pain -Patient with MI x3, troponin x3 negative  -9/22 echocardiogram; shows worsening cardiac function see results below -CXR negative for pneumonia, afebrile, negative bands or left shift. Leukocytosis most likely reactive DC antibiotics -UDS pending  HTN -Within AHA guidelines -Continue Lasix 40 mg a -Continue Imdur 30 mg daily -Continue metoprolol 37.5 mg BID -Cardiology recommendations pending  Chest pain noncardiac -Continue tramadol 50 mg BID -Continue Robaxin 750 mg TID  Hx DVT LLE not on anticoagulation/abnormal d-dimer -Stat CT angiogram; negative for PE.  HLD -Continue Lipitor 80 mg daily -9/2 Lipid panel shows patient has not with an AHA guidelines, dietitian consult  Alcohol abuse -Stable -UDS pending  Tobacco abuse -Counseled on cessation    Code Status: FULL Family Communication: no family present at time of exam Disposition Plan: Discharge in 24-48 hours    Consultants: Dr. Glenetta Hew (cardiology)    Procedure/Significant Events: 11/26/2012 echocardiogram;Left ventricle: mild LVH. -LVEF= 50% to 55%.; Mild hypokinesis of the distalanteroseptal myocardium.  -(grade1 diastolic dysfunction).   9/21 CXR;No acute cardiopulmonary disease.-Cardiomegaly w/o CHF -Mild subsegmental atelectasis at the left lung base.- No evidence of PNA 9/22 CT angiogram chest PE protocol;No evidence of PE 9/22 echocardiogram; LVEF=  35% to 40%. Hypokinesis of the inferolateral/ inferior myocardium. - (grade 1 diastolic  dysfunction).   Culture 9/21 blood pending 9/21 MRSA by PCR negative   Antibiotics: Cefepime 9/21>> stopped 9/22 Vancomycin 9/21>> stopped 9/22    DVT prophylaxis: Heparin subcutaneous   Devices NA   LINES / TUBES:       Continuous Infusions:   Objective: VITAL SIGNS: Temp: 98.1 F (36.7 C) (09/22 0740) Temp src: Oral (09/22 0740) BP: 121/87 mmHg (09/22 0740) Pulse Rate: 77 (09/22 0740) SPO2; FIO2:   Intake/Output Summary (Last 24 hours) at 07/20/14 0834 Last data filed at 07/20/14 0400  Gross per 24 hour  Intake    300 ml  Output   2425 ml  Net  -2125 ml     Exam: General: A./O. x4, moderate distress secondary to ongoing CT rated at 9/10, positive SOB  Lungs: Clear to auscultation bilaterally without wheezes or crackles Cardiovascular: Regular rate and rhythm without murmur gallop or rub normal S1 and S2; reproducible CP to palpation over the sternum and left breast.  Abdomen: Nontender, nondistended, soft, bowel sounds positive, no rebound, no ascites, no appreciable mass Extremities: No significant cyanosis, clubbing, or edema bilateral lower extremities  Data Reviewed: Basic Metabolic Panel:  Recent Labs Lab 07/19/14 1435  NA 139  K 3.8  CL 102  CO2 23  GLUCOSE 111*  BUN 13  CREATININE 0.87  CALCIUM 9.0   Liver Function Tests:  Recent Labs Lab 07/19/14 1435  AST 26  ALT 20  ALKPHOS 65  BILITOT 0.3  PROT 6.7  ALBUMIN 3.6   No results found for this basename: LIPASE, AMYLASE,  in the last 168 hours No results found for this basename: AMMONIA,  in the last 168 hours CBC:  Recent Labs Lab 07/19/14 1435  WBC 11.9*  HGB 14.1  HCT 41.7  MCV 81.1  PLT 184   Cardiac Enzymes:  Recent Labs Lab 07/19/14 1436 07/19/14 2010 07/19/14 2238 07/20/14 0109  TROPONINI <0.30 <0.30 <0.30 <0.30   BNP (last 3 results)  Recent Labs  07/19/14 1436  PROBNP 209.0*   CBG:  Recent Labs Lab 07/19/14 2157 07/20/14 0749  GLUCAP 189* 111*    Recent Results (from the past 240 hour(s))  MRSA PCR SCREENING     Status: None   Collection Time    07/19/14  7:33 PM      Result Value Ref Range Status   MRSA by PCR NEGATIVE  NEGATIVE Final   Comment:             The GeneXpert MRSA Assay (FDA     approved for NASAL specimens     only), is one component of a     comprehensive MRSA colonization     surveillance program. It is not     intended to diagnose MRSA     infection nor to guide or     monitor treatment for     MRSA infections.     Studies:  Recent x-ray studies have been reviewed in detail by the Attending Physician  Scheduled Meds:  Scheduled Meds: . aspirin  324 mg Oral Once  . aspirin EC  81 mg Oral Daily  . atorvastatin  80 mg Oral q1800  . ceFEPime (MAXIPIME) IV  2 g Intravenous Q12H  . gabapentin  100 mg Oral TID  . heparin  5,000 Units Subcutaneous 3 times per day  . insulin aspart  0-9 Units Subcutaneous TID WC  . isosorbide mononitrate  30 mg Oral Daily  . metoprolol tartrate  37.5 mg Oral BID  .  vancomycin  750 mg Intravenous Q8H    Time spent on care of this patient: 40 mins   Allie Bossier , MD   [facility] Office  [phone] Pager [phone]  On-Call/Text Page:      [email or web address]      password [email or web address]  If 7PM-7AM, please contact night-coverage [email or web address] Password [email or web address] 07/20/2014, 8:34 AM   LOS: 1 day

## 2014-07-21 DIAGNOSIS — I2589 Other forms of chronic ischemic heart disease: Secondary | ICD-10-CM

## 2014-07-21 LAB — PRO B NATRIURETIC PEPTIDE: Pro B Natriuretic peptide (BNP): 211.4 pg/mL — ABNORMAL HIGH (ref 0–125)

## 2014-07-21 LAB — GLUCOSE, CAPILLARY
GLUCOSE-CAPILLARY: 94 mg/dL (ref 70–99)
Glucose-Capillary: 128 mg/dL — ABNORMAL HIGH (ref 70–99)

## 2014-07-21 MED ORDER — FUROSEMIDE 40 MG PO TABS: 40.0000 mg | ORAL_TABLET | Freq: Every day | ORAL | Status: DC

## 2014-07-21 NOTE — Progress Notes (Signed)
Patient prepared for d/c home at 15;30. Sister updated with regard to home services.

## 2014-07-21 NOTE — Discharge Summary (Signed)
Physician Discharge Summary  Daniel Li OBS:[id number] DOB: 03-06-1956 DOA: 07/19/2014  PCP: Rosita Fire, MD  Admit date: 07/19/2014 Discharge date: 07/21/2014  Time spent: >30 minutes  Recommendations for Outpatient Follow-up:  1. Followup with Dr. Harl Bowie as scheduled 2. Followup with Dr. Felicie Morn to schedule appointment as soon as possible 3. Home health RN for CAD and CHF management after discharge  Discharge Diagnoses:    Chest pain/triple vessel CAD - medical management   Diabetes mellitus type 2 with complications   Hypercholesterolemia   History of statin induced rhabdomyolysis   Tobacco abuse   Ischemic cardiomyopathy: EF 35% on cath 06/30/14, new from Jan 2015; Confirmed on Echo with Inferior Hypokinesis  Discharge Condition: Stable  Diet recommendation: Heart healthy  Filed Weights   07/19/14 1438 07/21/14 0400  Weight: 194 lb (87.998 kg) 193 lb 12.6 oz (87.9 kg)    History of present illness:  58 year old male patient with type 2 diabetes, dyslipidemia, hypertension, history of multiple MIs and prior coronary interventions. Most recent cardiac catheterization on 06/30/2014 he was found to have 3 vessel CAD with no apparent amenable targets for PCI. At time of discharge medical therapy was planned. He also has ongoing history of alcohol abuse. On the morning of admission he had sudden onset chest pain. He took nitroglycerin without relief. EMS was called and administered further nitroglycerin again without any relief. Patient described the pain as typical to his initial heart attack. He described this as being tightness. Patient also had been having a productive cough for the past month with greenish-colored sputum.   In the emergency department patient's initial troponin was negative, EKG was negative for acute ischemic changes. Chest x-ray showed possible atelectasis versus left lower lobe pneumonia and mild pulmonary vascular congestion. Had a white count of  11,900.  Hospital Course:   Acute on chronic systolic and diastolic CHF EF on recent cardiac catheterization 9/2 estimated at 30%-echocardiogram completed this admission and estimated ejection fraction 35-40%. Cardiology has suspected that initial presenting symptoms may be related to heart failure exacerbation. Was on hydrochlorothiazide prior to admission this was discontinued in favor of Lasix. Patient had a brisk diuretic response and currently is nearly 6000 cc negative. Plan is to continue Lasix, ACE inhibitor and beta blocker after discharge. Discussed with patient's sister and she has noticed the patient has some short-term memory deficits and forgets to participate in his care appropriately including forgetting physician appointments. She agrees that home health RN with focus on CAD and CHF management would be appropriate.  Known 3 vessel CAD/chest pain Appreciate cardiology assistance this admission. EKG and serial enzymes have been negative. Imdur was continued. Suspect pain was related to angina but no additional cardiac evaluation indicated at this time since recently underwent cardiac catheterization earlier in the month. Cardiology documents that recent echo confirms cath results with reproducible regional wall motion abnormalities. It was felt though he may not have PCI options but consideration was given to asking the interventionalists to review the films for the patient's cardiologist Dr. Harl Bowie. On date of discharge patient was chest pain free.  Remote history DVT Patient reports that while incarcerated around the year 2000 he had a DVT. This was a solitary event and has not recurred since that time. Because of his atypical chest pain a CT of the chest was obtained which was negative for pulmonary embolism. Patient does not have any physical findings consistent with lower extremity edema/DVT so no additional workup is indicated.  HTN  Currently well controlled  HLD /apparent  statin induced rhabdomyolysis Recent cholesterol 242 with LDL elevated at 162. Previously documented this admission (and the patient's sister reported) that his doctor took him off his cholesterol medication. Discuss with Dr. Legrand Rams. Patient apparently with history of recurrent issues related to myalgias and elevated CPK in setting of statin use. Initially Crestor was the offending agent so it was discontinued, but based on my conversation with Dr. Legrand Rams it seems the Lipitor was also discontinued for the same reason. I discussed this with inpatient cardiology group and plans are to not send patient home on statin. Dr. Harl Bowie can further discuss with Dr. Legrand Rams to determine if this patient is appropriate for an attempt to resume statin therapy  Alcohol abuse  Currently stable and did not exhibit symptoms of withdrawal during the hospitalization   Tobacco abuse  Counseled on cessation by Dr. Sherral Hammers this admission   Procedures: 2-D echocardiogram: LVEF= 35% to 40%. Hypokinesis of the inferolateral/ inferior myocardium.  - (grade 1 diastolic dysfunction).   Consultations: Cardiology/Dr. Ellyn Hack  Discharge Exam: Filed Vitals:   07/21/14 1243  BP: 122/77  Pulse: 70  Temp: 97.4 F (36.3 C)  Resp: 15   General: No acute distress, seems restless but denies chest pain or shortness of breath Lungs: Clear to auscultation bilaterally without wheezes or crackles, room air  Cardiovascular: Regular rate and rhythm without murmur gallop or rub normal S1 and S2; and 10 used to have reproducible CP to palpation over the sternum and left breast.  Abdomen: Nontender, nondistended, soft, bowel sounds positive, no rebound, no ascites, no appreciable mass  Extremities: No significant cyanosis, clubbing, or edema bilateral lower extremities  Current Discharge Medication List    START taking these medications   Details  furosemide (LASIX) 40 MG tablet Take 1 tablet (40 mg total) by mouth daily. Qty: 30  tablet, Refills: 0      CONTINUE these medications which have NOT CHANGED   Details  aspirin EC 81 MG tablet Take 81 mg by mouth daily.    cyclobenzaprine (FLEXERIL) 10 MG tablet Take 10 mg by mouth 2 (two) times daily as needed for muscle spasms.    gabapentin (NEURONTIN) 100 MG capsule Take 100 mg by mouth 3 (three) times daily.    glipiZIDE (GLUCOTROL XL) 10 MG 24 hr tablet Take 1 tablet (10 mg total) by mouth daily. Qty: 30 tablet, Refills: 1    isosorbide mononitrate (IMDUR) 30 MG 24 hr tablet Take 1 tablet (30 mg total) by mouth daily. Qty: 30 tablet, Refills: 11    lisinopril (PRINIVIL,ZESTRIL) 20 MG tablet Take 1 tablet (20 mg total) by mouth daily. Qty: 30 tablet, Refills: 6    methocarbamol (ROBAXIN) 500 MG tablet Take 500 mg by mouth every 8 (eight) hours as needed for muscle spasms.    metoprolol tartrate (LOPRESSOR) 25 MG tablet Take 1.5 tablets (37.5 mg total) by mouth 2 (two) times daily. Qty: 90 tablet, Refills: 11    nitroGLYCERIN (NITROSTAT) 0.4 MG SL tablet Place 1 tablet (0.4 mg total) under the tongue every 5 (five) minutes as needed for chest pain (up to 3 doses). Qty: 25 tablet, Refills: 3    traMADol (ULTRAM) 50 MG tablet Take 50 mg by mouth 2 (two) times daily as needed for moderate pain.    albuterol (PROVENTIL HFA;VENTOLIN HFA) 108 (90 BASE) MCG/ACT inhaler Inhale 2 puffs into the lungs 2 (two) times daily as needed for wheezing or shortness of breath.  STOP taking these medications     atorvastatin (LIPITOR) 80 MG tablet      hydrochlorothiazide (HYDRODIURIL) 25 MG tablet        Allergies  Allergen Reactions  . Pork-Derived Products Other (See Comments)    Does not eat for religious reasons   Follow-up Information   Schedule an appointment as soon as possible for a visit with [facility], MD.   Specialty:  Internal Medicine   Contact information:   [address] [place] [id number] [phone]       The results of  significant diagnostics from this hospitalization (including imaging, microbiology, ancillary and laboratory) are listed below for reference.    Significant Diagnostic Studies: Dg Chest 2 View  07/19/2014   CLINICAL DATA:  Chest pain.  Shortness of breath.  EXAM: CHEST  2 VIEW  COMPARISON:  Earlier today at 1400 hr.  FINDINGS: 1823 hr. Lateral view degraded by patient arm position. Artifact degradation posteriorly on the lateral view. Midline trachea. Mild cardiomegaly. No pleural effusion or pneumothorax. lung volumes remain low, slightly improved. Mild left base subsegmental atelectasis remains. No lobar consolidation. No congestive failure.  IMPRESSION: No acute cardiopulmonary disease.  Cardiomegaly without congestive failure.  Mild subsegmental atelectasis at the left lung base. No evidence of pneumonia.   Electronically Signed   By: Abigail Miyamoto M.D.   On: 07/19/2014 18:43   Ct Angio Chest Pe W/cm &/or Wo Cm  07/20/2014   CLINICAL DATA:  Chest pain, shortness of breath.  EXAM: CT ANGIOGRAPHY CHEST WITH CONTRAST  TECHNIQUE: Multidetector CT imaging of the chest was performed using the standard protocol during bolus administration of intravenous contrast. Multiplanar CT image reconstructions and MIPs were obtained to evaluate the vascular anatomy.  CONTRAST:  147mL OMNIPAQUE IOHEXOL 350 MG/ML SOLN  COMPARISON:  CT scan of April 14, 2006.  FINDINGS: No pneumothorax or pleural effusion is noted. Minimal subsegmental atelectasis is noted posteriorly in both lung bases. There is no evidence of pulmonary embolus. Visualized portion of upper abdomen appears normal. No mediastinal mass or adenopathy is noted. No significant osseous abnormality is noted. There is no evidence of thoracic aortic aneurysm.  Review of the MIP images confirms the above findings.  IMPRESSION: No evidence of pulmonary embolus. No significant abnormality seen in the chest.   Electronically Signed   By: Sabino Dick M.D.   On: 07/20/2014  11:05   Dg Chest Port 1 View  07/19/2014   CLINICAL DATA:  Chest pain, neck pain left arm pain. Five coronary artery stents placed in the past.  EXAM: PORTABLE CHEST - 1 VIEW  COMPARISON:  06/28/2014.  FINDINGS: Decreased inspiration. Stable borderline enlarged cardiac silhouette. Mildly prominent pulmonary vasculature, accentuated by the poor inspiration. Mild patchy density at the left lung base. Clear right lung. Lower thoracic spine degenerative changes. Small amount of bilateral neck calcification.  IMPRESSION: 1. Poor inspiration with mild patchy atelectasis or pneumonia in the left lower lobe. 2. Stable borderline cardiomegaly and mild pulmonary vascular congestion. 3. Small amount of bilateral carotid artery atheromatous calcification.   Electronically Signed   By: Enrique Sack M.D.   On: 07/19/2014 15:07    Microbiology: Recent Results (from the past 240 hour(s))  CULTURE, BLOOD (SINGLE)     Status: None   Collection Time    07/19/14  5:33 PM      Result Value Ref Range Status   Specimen Description BLOOD LEFT ANTECUBITAL   Final   Special Requests BOTTLES DRAWN  AEROBIC ONLY 4CC   Final   Culture  Setup Time     Final   Value: 07/20/2014 01:29     Performed at [facility]   Culture     Final   Value:        BLOOD CULTURE RECEIVED NO GROWTH TO DATE CULTURE WILL BE HELD FOR 5 DAYS BEFORE ISSUING A FINAL NEGATIVE REPORT     Performed at [facility]   Report Status PENDING   Incomplete  MRSA PCR SCREENING     Status: None   Collection Time    07/19/14  7:33 PM      Result Value Ref Range Status   MRSA by PCR NEGATIVE  NEGATIVE Final   Comment:            The GeneXpert MRSA Assay (FDA     approved for NASAL specimens     only), is one component of a     comprehensive MRSA colonization     surveillance program. It is not     intended to diagnose MRSA     infection nor to guide or     monitor treatment for     MRSA infections.     Labs: Basic Metabolic  Panel:  Recent Labs Lab 07/19/14 1435 07/20/14 1010  NA 139 136*  K 3.8 3.7  CL 102 97  CO2 23 27  GLUCOSE 111* 135*  BUN 13 12  CREATININE 0.87 0.87  CALCIUM 9.0 9.9  MG  --  1.9   Liver Function Tests:  Recent Labs Lab 07/19/14 1435 07/20/14 1010  AST 26 18  ALT 20 17  ALKPHOS 65 70  BILITOT 0.3 0.7  PROT 6.7 7.3  ALBUMIN 3.6 3.5   CBC:  Recent Labs Lab 07/19/14 1435 07/20/14 1010  WBC 11.9* 12.0*  NEUTROABS  --  9.3*  HGB 14.1 15.2  HCT 41.7 44.7  MCV 81.1 80.7  PLT 184 206   Cardiac Enzymes:  Recent Labs Lab 07/19/14 1436 07/19/14 2010 07/19/14 2238 07/20/14 0109  TROPONINI <0.30 <0.30 <0.30 <0.30   BNP: BNP (last 3 results)  Recent Labs  07/19/14 1436 07/21/14 0245  PROBNP 209.0* 211.4*   CBG:  Recent Labs Lab 07/20/14 1243 07/20/14 1704 07/20/14 2142 07/21/14 0810 07/21/14 1242  GLUCAP 105* 132* 95 128* 94   Signed:  ELLIS,ALLISON L. ANP [facility] 07/21/2014, 3:03 PM  I have personally examined this patient and reviewed the entire database. I have reviewed the above note, made any necessary editorial changes, and agree with its content.  Cherene Altes, MD [facility]

## 2014-07-21 NOTE — Discharge Instructions (Signed)
Furosemide tablets What is this medicine? FUROSEMIDE (fyoor [place] se mide) is a diuretic. It helps you make more urine and to lose salt and excess water from your body. This medicine is used to treat high blood pressure, and edema or swelling from heart, kidney, or liver disease. This medicine may be used for other purposes; ask your health care provider or pharmacist if you have questions. COMMON BRAND NAME(S): Delone, Lasix What should I tell my health care provider before I take this medicine? They need to know if you have any of these conditions: -abnormal blood electrolytes -diarrhea or vomiting -gout -heart disease -kidney disease, small amounts of urine, or difficulty passing urine -liver disease -an unusual or allergic reaction to furosemide, sulfa drugs, other medicines, foods, dyes, or preservatives -pregnant or trying to get pregnant -breast-feeding How should I use this medicine? Take this medicine by mouth with a glass of water. Follow the directions on the prescription label. You may take this medicine with or without food. If it upsets your stomach, take it with food or milk. Do not take your medicine more often than directed. Remember that you will need to pass more urine after taking this medicine. Do not take your medicine at a time of day that will cause you problems. Do not take at bedtime. Talk to your pediatrician regarding the use of this medicine in children. While this drug may be prescribed for selected conditions, precautions do apply. Overdosage: If you think you have taken too much of this medicine contact a poison control center or emergency room at once. NOTE: This medicine is only for you. Do not share this medicine with others. What if I miss a dose? If you miss a dose, take it as soon as you can. If it is almost time for your next dose, take only that dose. Do not take double or extra doses. What may interact with this medicine? -aspirin and aspirin-like  medicines -certain antibiotics -chloral hydrate -cisplatin -cyclosporine -digoxin -diuretics -laxatives -lithium -medicines for blood pressure -medicines that relax muscles for surgery -methotrexate -NSAIDs, medicines for pain and inflammation like ibuprofen, naproxen, or indomethacin -phenytoin -steroid medicines like prednisone or cortisone -sucralfate This list may not describe all possible interactions. Give your health care provider a list of all the medicines, herbs, non-prescription drugs, or dietary supplements you use. Also tell them if you smoke, drink alcohol, or use illegal drugs. Some items may interact with your medicine. What should I watch for while using this medicine? Visit your doctor or health care professional for regular checks on your progress. Check your blood pressure regularly. Ask your doctor or health care professional what your blood pressure should be, and when you should contact him or her. If you are a diabetic, check your blood sugar as directed. You may need to be on a special diet while taking this medicine. Check with your doctor. Also, ask how many glasses of fluid you need to drink a day. You must not get dehydrated. You may get drowsy or dizzy. Do not drive, use machinery, or do anything that needs mental alertness until you know how this drug affects you. Do not stand or sit up quickly, especially if you are an older patient. This reduces the risk of dizzy or fainting spells. Alcohol can make you more drowsy and dizzy. Avoid alcoholic drinks. This medicine can make you more sensitive to the sun. Keep out of the sun. If you cannot avoid being in the sun, wear protective clothing  and use sunscreen. Do not use sun lamps or tanning beds/booths. What side effects may I notice from receiving this medicine? Side effects that you should report to your doctor or health care professional as soon as possible: -blood in urine or stools -dry mouth -fever or  chills -hearing loss or ringing in the ears -irregular heartbeat -muscle pain or weakness, cramps -skin rash -stomach upset, pain, or nausea -tingling or numbness in the hands or feet -unusually weak or tired -vomiting or diarrhea -yellowing of the eyes or skin Side effects that usually do not require medical attention (report to your doctor or health care professional if they continue or are bothersome): -headache -loss of appetite -unusual bleeding or bruising This list may not describe all possible side effects. Call your doctor for medical advice about side effects. You may report side effects to FDA at 1-800-FDA-1088. Where should I keep my medicine? Keep out of the reach of children. Store at room temperature between 15 and 30 degrees C (59 and 86 degrees F). Protect from light. Throw away any unused medicine after the expiration date. NOTE: This sheet is a summary. It may not cover all possible information. If you have questions about this medicine, talk to your doctor, pharmacist, or health care provider.  2015, Elsevier/Gold Standard. (2009-10-03 16:24:50) Heart Failure Heart failure means your heart has trouble pumping blood. This makes it hard for your body to work well. Heart failure is usually a long-term (chronic) condition. You must take good care of yourself and follow your doctor's treatment plan. HOME CARE  Take your heart medicine as told by your doctor.  Do not stop taking medicine unless your doctor tells you to.  Do not skip any dose of medicine.  Refill your medicines before they run out.  Take other medicines only as told by your doctor or pharmacist.  Stay active if told by your doctor. The elderly and people with severe heart failure should talk with a doctor about physical activity.  Eat heart-healthy foods. Choose foods that are without trans fat and are low in saturated fat, cholesterol, and salt (sodium). This includes fresh or frozen fruits and  vegetables, fish, lean meats, fat-free or low-fat dairy foods, whole grains, and high-fiber foods. Lentils and dried peas and beans (legumes) are also good choices.  Limit salt if told by your doctor.  Cook in a healthy way. Roast, grill, broil, bake, poach, steam, or stir-fry foods.  Limit fluids as told by your doctor.  Weigh yourself every morning. Do this after you pee (urinate) and before you eat breakfast. Write down your weight to give to your doctor.  Take your blood pressure and write it down if your doctor tells you to.  Ask your doctor how to check your pulse. Check your pulse as told.  Lose weight if told by your doctor.  Stop smoking or chewing tobacco. Do not use gum or patches that help you quit without your doctor's approval.  Schedule and go to doctor visits as told.  Nonpregnant women should have no more than 1 drink a day. Men should have no more than 2 drinks a day. Talk to your doctor about drinking alcohol.  Stop illegal drug use.  Stay current with shots (immunizations).  Manage your health conditions as told by your doctor.  Learn to manage your stress.  Rest when you are tired.  If it is really hot outside:  Avoid intense activities.  Use air conditioning or fans, or get in  a cooler place.  Avoid caffeine and alcohol.  Wear loose-fitting, lightweight, and light-colored clothing.  If it is really cold outside:  Avoid intense activities.  Layer your clothing.  Wear mittens or gloves, a hat, and a scarf when going outside.  Avoid alcohol.  Learn about heart failure and get support as needed.  Get help to maintain or improve your quality of life and your ability to care for yourself as needed. GET HELP IF:   You gain 03 lb/1.4 kg or more in 1 day or 05 lb/2.3 kg in a week.  You are more short of breath than usual.  You cannot do your normal activities.  You tire easily.  You cough more than normal, especially with activity.  You  have any or more puffiness (swelling) in areas such as your hands, feet, ankles, or belly (abdomen).  You cannot sleep because it is hard to breathe.  You feel like your heart is beating fast (palpitations).  You get dizzy or light-headed when you stand up. GET HELP RIGHT AWAY IF:   You have trouble breathing.  There is a change in mental status, such as becoming less alert or not being able to focus.  You have chest pain or discomfort.  You faint. MAKE SURE YOU:   Understand these instructions.  Will watch your condition.  Will get help right away if you are not doing well or get worse. Document Released: 07/24/2008 Document Revised: 03/01/2014 Document Reviewed: 12/01/2012 [facility] Patient Information 2015 [place], [place]. This information is not intended to replace advice given to you by your health care provider. Make sure you discuss any questions you have with your health care provider.

## 2014-07-21 NOTE — Care Management Note (Signed)
    Page 1 of 1   07/21/2014     3:05:17 PM CARE MANAGEMENT NOTE 07/21/2014  Patient:  Daniel, Li   Account Number:  [id number]  Date Initiated:  07/21/2014  Documentation initiated by:  Coreyon Nicotra  Subjective/Objective Assessment:   dx diastolic/systolic failure; lives alone    PCP  Tesfaye Fanta     Action/Plan:   Anticipated DC Date:     Anticipated DC Plan:  [place]  CM consult      [facility] Choice  HOME HEALTH   Choice offered to / List presented to:  C-5 Sibling      HH arranged  HH-1 RN  [place].   Status of service:  Completed, signed off  Discharge Disposition:  [place]  Per UR Regulation:  Reviewed for med. necessity/level of care/duration of stay  Comments:  07/21/14 [place] MSN BSN CCM Pt to discharge home with home health RN for heart failure education and mgmt.  Provided list of agencies to pt but he wants CM to discuss with sister.  TC to sister, Daniel Li, @[phone], and reviewed list of agencies.  Referral made to [facility] per sister's choice.  Pt will be residing @ [address] in [place], telephone [phone].

## 2014-07-21 NOTE — Progress Notes (Signed)
Patient Name: Daniel Li Date of Encounter: 07/21/2014  Principal Problem:   Chest pain Active Problems:   Diabetes mellitus type 2 with complications   CAD (coronary artery disease)   Hypercholesterolemia   Tobacco abuse   Cardiomyopathy EF 35% on cath 06/30/14, new from Jan 2015    Patient Profile: 58 yo male w/ hx cath 06/2014 CFX 100%, RCA dist dz (no PCI possible), LAD mod dz, D1 80% EF 35%. Also w/ possible OSA, HTN, HLD, TOB, GERD. Admitted 09/21 w/ chest pain, ez neg MI, EF unchanged from 06/30/2014 cath. CTA neg for PE.   SUBJECTIVE: Still gets a little pain w/ deep inspiration, denies other chest pain or SOB  OBJECTIVE Filed Vitals:   07/20/14 2203 07/21/14 0011 07/21/14 0400 07/21/14 0804  BP: 124/81 109/70 111/80   Pulse: 84 76 68   Temp:  98.2 F (36.8 C) 98 F (36.7 C) 98.1 F (36.7 C)  TempSrc:  Oral Oral Oral  Resp:  13 11   Height:      Weight:   193 lb 12.6 oz (87.9 kg)   SpO2:  95% 90%     Intake/Output Summary (Last 24 hours) at 07/21/14 1219 Last data filed at 07/21/14 1100  Gross per 24 hour  Intake    240 ml  Output   3300 ml  Net  -3060 ml   Filed Weights   07/19/14 1438 07/21/14 0400  Weight: 194 lb (87.998 kg) 193 lb 12.6 oz (87.9 kg)    PHYSICAL EXAM General: Well developed, well nourished, male in no acute distress. Head: Normocephalic, atraumatic.  Neck: Supple without bruits, JVD minimal elevation Lungs:  Resp regular and unlabored, few rales bases, good air exchange. Heart: RRR, S1, S2, no S3, S4, or murmur; no rub. Abdomen: Soft, non-tender, non-distended, BS + x 4.  Extremities: No clubbing, cyanosis, no edema.  Neuro: Alert and oriented X 3. Moves all extremities spontaneously. Psych: Normal affect.  LABS: CBC:  Recent Labs  07/19/14 1435 07/20/14 1010  WBC 11.9* 12.0*  NEUTROABS  --  9.3*  HGB 14.1 15.2  HCT 41.7 44.7  MCV 81.1 80.7  PLT 184 161   Basic Metabolic Panel:  Recent Labs   07/19/14 1435 07/20/14 1010  NA 139 136*  K 3.8 3.7  CL 102 97  CO2 23 27  GLUCOSE 111* 135*  BUN 13 12  CREATININE 0.87 0.87  CALCIUM 9.0 9.9  MG  --  1.9   Liver Function Tests:  Recent Labs  07/19/14 1435 07/20/14 1010  AST 26 18  ALT 20 17  ALKPHOS 65 70  BILITOT 0.3 0.7  PROT 6.7 7.3  ALBUMIN 3.6 3.5   Cardiac Enzymes:  Recent Labs  07/19/14 2010 07/19/14 2238 07/20/14 0109  TROPONINI <0.30 <0.30 <0.30    Recent Labs  07/19/14 1448  TROPIPOC 0.00   BNP: Pro B Natriuretic peptide (BNP)  Date/Time Value Ref Range Status  07/21/2014  2:45 AM 211.4* 0 - 125 pg/mL Final  07/19/2014  2:36 PM 209.0* 0 - 125 pg/mL Final   D-dimer:  Recent Labs  07/19/14 1733  DDIMER 0.54*   Lab Results  Component Value Date   CHOL 242* 06/30/2014   HDL 58 06/30/2014   LDLCALC 162* 06/30/2014   TRIG 111 06/30/2014   CHOLHDL 4.2 06/30/2014   TELE:  SR,     ECHO: 07/19/2014  Study Conclusions - Left ventricle: The cavity size was normal. Systolic function  was moderately reduced. The estimated ejection fraction was in the range of 35% to 40%. There is hypokinesis of the inferolateral and inferior myocardium. Doppler parameters are consistent with abnormal left ventricular relaxation (grade 1 diastolic dysfunction). - Mitral valve: There was mild regurgitation. Impressions: - When compared to prior echo (10/2012), EF is reduced. (Same as EF from cath 06/30/2014.)   Radiology/Studies: Dg Chest 2 View  07/19/2014   CLINICAL DATA:  Chest pain.  Shortness of breath.  EXAM: CHEST  2 VIEW  COMPARISON:  Earlier today at 1400 hr.  FINDINGS: 1823 hr. Lateral view degraded by patient arm position. Artifact degradation posteriorly on the lateral view. Midline trachea. Mild cardiomegaly. No pleural effusion or pneumothorax. lung volumes remain low, slightly improved. Mild left base subsegmental atelectasis remains. No lobar consolidation. No congestive failure.  IMPRESSION: No acute  cardiopulmonary disease.  Cardiomegaly without congestive failure.  Mild subsegmental atelectasis at the left lung base. No evidence of pneumonia.   Electronically Signed   By: Abigail Miyamoto M.D.   On: 07/19/2014 18:43   Ct Angio Chest Pe W/cm &/or Wo Cm 07/20/2014   CLINICAL DATA:  Chest pain, shortness of breath.  EXAM: CT ANGIOGRAPHY CHEST WITH CONTRAST  TECHNIQUE: Multidetector CT imaging of the chest was performed using the standard protocol during bolus administration of intravenous contrast. Multiplanar CT image reconstructions and MIPs were obtained to evaluate the vascular anatomy.  CONTRAST:  160m OMNIPAQUE IOHEXOL 350 MG/ML SOLN  COMPARISON:  CT scan of April 14, 2006.  FINDINGS: No pneumothorax or pleural effusion is noted. Minimal subsegmental atelectasis is noted posteriorly in both lung bases. There is no evidence of pulmonary embolus. Visualized portion of upper abdomen appears normal. No mediastinal mass or adenopathy is noted. No significant osseous abnormality is noted. There is no evidence of thoracic aortic aneurysm.  Review of the MIP images confirms the above findings.  IMPRESSION: No evidence of pulmonary embolus. No significant abnormality seen in the chest.   Electronically Signed   By: JSabino DickM.D.   On: 07/20/2014 11:05     Current Medications:  . aspirin  324 mg Oral Once  . aspirin EC  81 mg Oral Daily  . atorvastatin  80 mg Oral q1800  . furosemide  40 mg Oral Daily  . gabapentin  100 mg Oral TID  . heparin  5,000 Units Subcutaneous 3 times per day  . insulin aspart  0-9 Units Subcutaneous TID WC  . isosorbide mononitrate  30 mg Oral Daily  . metoprolol tartrate  37.5 mg Oral BID      ASSESSMENT AND PLAN: Principal Problem:  Chest pain- ez negative MI, elevated ddimer and hx of DVTs, but CTA negative. Consider ESR, will leave to MD.   Active Problems:  Diabetes mellitus type 2 with complications-per IM   Hypercholesterolemia on lipitor 80 but LDL of 162,  -- had been on Crestor that was "stopped" by PCP or Dr. BHarl Bowie would not confuse the issue by Rx'ing this med for d/c.  Tobacco abuse- recommend stopping- he said he stopped 3-4 days ago because the chest pain was so bad. Encouraged continued cessation.  Cardiomyopathy- new decrease in EF from 2014 to 06/30/14 to 35% by cath, confirmed by echo- some HF yesterday now -6L .   CAD- + nuc study in 06/29/14 but on cath 06/30/14 No changes from cath 10/2012: 100% stenosis of LCX-chronic, LAD 30-40%, diag branch with patent stent mid portion with 60-70% stenosis in 1.75 mm vessel-  and patent stent in RCA PDA with focal 50% stenosis, PLA sub-total occlusion and fills from right to right collateral. Isosorbide was added.   Principal Problem:   Chest pain Active Problems:   Diabetes mellitus type 2 with complications   CAD (coronary artery disease)   Hypercholesterolemia   Tobacco abuse   Cardiomyopathy EF 35% on cath 06/30/14, new from Jan 2015   Signed, Rosaria Ferries , PA-C 12:19 PM 07/21/2014   have seen and evaluated the patient this PM with Rosaria Ferries, PA-C.   I agree with her findings, examination as well as impression recommendations.   CP notalby improved today - only with deep inspiration.  Neg Troponin & NEg CTA chest. Unlikely PE or Cardiac etiology.   Continues to diurese briskly on home lasix  PO 24m daily -- this will replace home HCTZ.  At this point, I do not feel that additional cardiac evaluation is necessary.   Echo yesterday essentially confirms Cath results (regional WMA also concur with angiography) -- unfortunately, it did not seem as though he has PCI options (can ask [employer] to review films for Dr. BHarl Bowie-- but the report is not promising)  BP & HR are stable on BB, Nitrate.  Had not been taking statin --  Crestor,  He was told to stop.  Will defer to Dr. BHarl Bowie/ PCP.  Would not Rx Atorvastatin (as ordered in-pt).  [facility] - stopped  yesterday.  No active cardiac conditions -- meds are stable   I anticipate d/c home soon per [facility].  Will ensure f/u established with Dr. BHarl Bowie  HLeonie Man M.D., M.S. Interventional Cardiologist   Pager # [phone]

## 2014-07-22 NOTE — ED Provider Notes (Signed)
I saw and evaluated the patient, reviewed the resident's note and I agree with the findings and plan.   EKG Interpretation   Date/Time:  Monday July 19 2014 14:30:33 EDT Ventricular Rate:  81 PR Interval:  146 QRS Duration: 97 QT Interval:  382 QTC Calculation: 443 R Axis:   23 Text Interpretation:  Sinus rhythm Consider left atrial enlargement  Borderline repolarization abnormality Confirmed by Jeneen Rinks  MD, [place] ([id number])  on 07/19/2014 2:51:02 PM      Please see my additional dictation.  Tanna Furry, MD 07/22/14 [phone]

## 2014-07-26 LAB — CULTURE, BLOOD (SINGLE): CULTURE: NO GROWTH

## 2014-07-29 ENCOUNTER — Other Ambulatory Visit: Payer: Medicaid Other

## 2014-08-02 ENCOUNTER — Ambulatory Visit: Payer: Medicaid Other | Admitting: Cardiology

## 2014-08-18 ENCOUNTER — Other Ambulatory Visit: Payer: Self-pay | Admitting: Cardiology

## 2014-08-25 ENCOUNTER — Other Ambulatory Visit: Payer: Self-pay | Admitting: Cardiology

## 2014-10-07 ENCOUNTER — Encounter (HOSPITAL_COMMUNITY): Payer: Self-pay | Admitting: Cardiovascular Disease

## 2014-10-14 ENCOUNTER — Ambulatory Visit (INDEPENDENT_AMBULATORY_CARE_PROVIDER_SITE_OTHER): Payer: Medicaid Other | Admitting: Cardiology

## 2014-10-14 ENCOUNTER — Ambulatory Visit: Payer: Medicaid Other | Admitting: Cardiology

## 2014-10-14 ENCOUNTER — Encounter: Payer: Self-pay | Admitting: Cardiology

## 2014-10-14 VITALS — BP 134/87 | HR 89 | Ht 66.5 in | Wt 195.0 lb

## 2014-10-14 DIAGNOSIS — E7800 Pure hypercholesterolemia, unspecified: Secondary | ICD-10-CM

## 2014-10-14 DIAGNOSIS — G4730 Sleep apnea, unspecified: Secondary | ICD-10-CM

## 2014-10-14 DIAGNOSIS — I10 Essential (primary) hypertension: Secondary | ICD-10-CM

## 2014-10-14 DIAGNOSIS — I5022 Chronic systolic (congestive) heart failure: Secondary | ICD-10-CM

## 2014-10-14 DIAGNOSIS — I2510 Atherosclerotic heart disease of native coronary artery without angina pectoris: Secondary | ICD-10-CM

## 2014-10-14 MED ORDER — EZETIMIBE 10 MG PO TABS: 10.0000 mg | ORAL_TABLET | Freq: Every day | ORAL | Status: DC

## 2014-10-14 MED ORDER — CARVEDILOL 6.25 MG PO TABS: 6.2500 mg | ORAL_TABLET | Freq: Two times a day (BID) | ORAL | Status: DC
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
## 2014-10-14 NOTE — Patient Instructions (Signed)
Your physician recommends that you schedule a follow-up appointment in: 1 month. Your physician has recommended you make the following change in your medication:  Stop metoprolol tartrate. Start carvedilol 6.25 mg twice daily. Start zetia 10 mg daily. Increase your furosemide to 40 mg in the morning and 20 mg in the evening for 3 days, then resume previous dosing of 40 mg daily in the morning. Continue all other medications the same. You have been referred to Dr. Adriana Reams.

## 2014-11-09 ENCOUNTER — Encounter: Payer: Self-pay | Admitting: Cardiology

## 2014-11-09 ENCOUNTER — Ambulatory Visit (INDEPENDENT_AMBULATORY_CARE_PROVIDER_SITE_OTHER): Payer: Medicaid Other | Admitting: Cardiology

## 2014-11-09 ENCOUNTER — Ambulatory Visit: Payer: Medicaid Other | Admitting: Cardiology

## 2014-11-09 VITALS — BP 148/102 | HR 84 | Ht 66.0 in | Wt 195.1 lb

## 2014-11-09 DIAGNOSIS — I10 Essential (primary) hypertension: Secondary | ICD-10-CM

## 2014-11-09 DIAGNOSIS — E785 Hyperlipidemia, unspecified: Secondary | ICD-10-CM

## 2014-11-09 DIAGNOSIS — I5022 Chronic systolic (congestive) heart failure: Secondary | ICD-10-CM

## 2014-11-09 DIAGNOSIS — I2510 Atherosclerotic heart disease of native coronary artery without angina pectoris: Secondary | ICD-10-CM

## 2014-11-09 MED ORDER — EZETIMIBE 10 MG PO TABS: 10.0000 mg | ORAL_TABLET | Freq: Every day | ORAL | Status: DC

## 2014-11-09 MED ORDER — FUROSEMIDE 40 MG PO TABS: 40.0000 mg | ORAL_TABLET | Freq: Every day | ORAL | Status: DC

## 2014-11-09 MED ORDER — CARVEDILOL 12.5 MG PO TABS: 12.5000 mg | ORAL_TABLET | Freq: Two times a day (BID) | ORAL | Status: DC

## 2014-11-09 NOTE — Patient Instructions (Addendum)
Your physician recommends that you schedule a follow-up appointment in: 1 month with Dr. Harl Bowie  Your physician has recommended you make the following change in your medication:   INCREASE COREG TO 12.5 MG TWICE DAILY  START ZETIA 10 MG DAILY  WE HAVE REFILLED LASIX WITH 11 REFILLS  Thank you for choosing [facility]!!
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
## 2014-11-11 ENCOUNTER — Ambulatory Visit: Payer: Medicaid Other | Admitting: Cardiology

## 2014-11-15 ENCOUNTER — Ambulatory Visit: Payer: Medicaid Other | Admitting: Cardiology

## 2014-12-10 ENCOUNTER — Ambulatory Visit (INDEPENDENT_AMBULATORY_CARE_PROVIDER_SITE_OTHER): Payer: Medicaid Other | Admitting: Cardiology

## 2014-12-10 ENCOUNTER — Encounter: Payer: Self-pay | Admitting: Cardiology

## 2014-12-10 VITALS — BP 127/85 | HR 90 | Ht 66.0 in | Wt 195.0 lb

## 2014-12-10 DIAGNOSIS — I2510 Atherosclerotic heart disease of native coronary artery without angina pectoris: Secondary | ICD-10-CM

## 2014-12-10 DIAGNOSIS — I10 Essential (primary) hypertension: Secondary | ICD-10-CM

## 2014-12-10 DIAGNOSIS — I5022 Chronic systolic (congestive) heart failure: Secondary | ICD-10-CM

## 2014-12-10 MED ORDER — CARVEDILOL 25 MG PO TABS: 25.0000 mg | ORAL_TABLET | Freq: Two times a day (BID) | ORAL | Status: DC

## 2014-12-10 NOTE — Patient Instructions (Signed)
   Increase Coreg to 25mg  twice a day  (may take 2 tabs of your 12.5mg  tablet twice a day till finish current supply) - new sent to pharmacy today.  Continue all other medications.   Follow up in  1 month
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
## 2015-01-21 ENCOUNTER — Encounter: Payer: Self-pay | Admitting: Cardiology

## 2015-01-21 ENCOUNTER — Ambulatory Visit (INDEPENDENT_AMBULATORY_CARE_PROVIDER_SITE_OTHER): Payer: Medicaid Other | Admitting: Cardiology

## 2015-01-21 VITALS — BP 123/78 | HR 93 | Ht 66.0 in | Wt 191.0 lb

## 2015-01-21 DIAGNOSIS — E785 Hyperlipidemia, unspecified: Secondary | ICD-10-CM

## 2015-01-21 DIAGNOSIS — I255 Ischemic cardiomyopathy: Secondary | ICD-10-CM

## 2015-01-21 DIAGNOSIS — I2510 Atherosclerotic heart disease of native coronary artery without angina pectoris: Secondary | ICD-10-CM | POA: Diagnosis not present

## 2015-01-21 DIAGNOSIS — I10 Essential (primary) hypertension: Secondary | ICD-10-CM

## 2015-01-21 MED ORDER — LISINOPRIL 40 MG PO TABS: 40.0000 mg | ORAL_TABLET | Freq: Every day | ORAL | Status: DC

## 2015-01-21 NOTE — Patient Instructions (Signed)
Your physician recommends that you schedule a follow-up appointment in: 1 month with Dr. Harl Bowie  Your physician has recommended you make the following change in your medication:   INCREASE LISINOPRIL 40 MG DAILY  CONTINUE ALL OTHER MEDICATIONS AS DIRECTED  Your physician recommends that you return for lab work in: 2 WEEKS BMP/MAG  Thank you for choosing [facility]!!
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
## 2015-02-08 ENCOUNTER — Telehealth: Payer: Self-pay | Admitting: *Deleted

## 2015-02-08 DIAGNOSIS — N289 Disorder of kidney and ureter, unspecified: Secondary | ICD-10-CM

## 2015-02-08 MED ORDER — LISINOPRIL 20 MG PO TABS: 20.0000 mg | ORAL_TABLET | Freq: Every day | ORAL | Status: DC

## 2015-02-08 NOTE — Telephone Encounter (Signed)
-----   Message from Laurine Blazer, LPN sent at 05/01/8888  8:06 AM EDT -----   ----- Message -----    From: Arnoldo Lenis, MD    Sent: 02/07/2015  12:56 PM      To: Laurine Blazer, LPN  The recent increase in his lisinopril appears to have put some strain on his kidneys. Please go back to lisonpril 20mg  daily, repeat BMET in 2 weeks.  Zandra Abts MD

## 2015-02-08 NOTE — Telephone Encounter (Signed)
PT sister Mardene Celeste) made aware of decrease of lisinopril and repeat BMP. Medication sent to pharmacy. Mailed lab orders with date to repeat. Forwarded results to Dr. Legrand Rams

## 2015-02-18 ENCOUNTER — Ambulatory Visit (INDEPENDENT_AMBULATORY_CARE_PROVIDER_SITE_OTHER): Payer: Medicaid Other | Admitting: Cardiology

## 2015-02-18 ENCOUNTER — Encounter: Payer: Self-pay | Admitting: Cardiology

## 2015-02-18 VITALS — BP 131/89 | HR 103 | Ht 66.0 in | Wt 188.0 lb

## 2015-02-18 DIAGNOSIS — I2510 Atherosclerotic heart disease of native coronary artery without angina pectoris: Secondary | ICD-10-CM | POA: Diagnosis not present

## 2015-02-18 DIAGNOSIS — I5022 Chronic systolic (congestive) heart failure: Secondary | ICD-10-CM

## 2015-02-18 NOTE — Patient Instructions (Signed)
Continue all current medications. Follow up in  3 months 
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
## 2015-05-04 ENCOUNTER — Encounter: Payer: Self-pay | Admitting: Cardiology

## 2015-05-04 ENCOUNTER — Encounter: Payer: Self-pay | Admitting: *Deleted

## 2015-05-04 ENCOUNTER — Ambulatory Visit (INDEPENDENT_AMBULATORY_CARE_PROVIDER_SITE_OTHER): Payer: Medicaid Other | Admitting: Cardiology

## 2015-05-04 VITALS — BP 137/89 | HR 78 | Ht 66.0 in | Wt 191.0 lb

## 2015-05-04 DIAGNOSIS — E785 Hyperlipidemia, unspecified: Secondary | ICD-10-CM | POA: Diagnosis not present

## 2015-05-04 DIAGNOSIS — I5022 Chronic systolic (congestive) heart failure: Secondary | ICD-10-CM

## 2015-05-04 DIAGNOSIS — I10 Essential (primary) hypertension: Secondary | ICD-10-CM

## 2015-05-04 DIAGNOSIS — I2510 Atherosclerotic heart disease of native coronary artery without angina pectoris: Secondary | ICD-10-CM | POA: Diagnosis not present

## 2015-05-04 MED ORDER — NITROGLYCERIN 0.4 MG SL SUBL: 0.4000 mg | SUBLINGUAL_TABLET | SUBLINGUAL | Status: DC | PRN

## 2015-05-04 MED ORDER — HYDRALAZINE HCL 25 MG PO TABS: 25.0000 mg | ORAL_TABLET | Freq: Three times a day (TID) | ORAL | Status: DC

## 2015-05-04 NOTE — Patient Instructions (Signed)
   Begin Hydralazine 25mg  three times per day - new sent to pharmacy today. Continue all other medications.   Follow up in  6 weeks
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
## 2015-05-06 ENCOUNTER — Other Ambulatory Visit: Payer: Self-pay | Admitting: Cardiology

## 2015-06-15 ENCOUNTER — Other Ambulatory Visit: Payer: Self-pay | Admitting: Cardiology

## 2015-06-20 ENCOUNTER — Encounter: Payer: Self-pay | Admitting: Cardiology

## 2015-06-20 ENCOUNTER — Ambulatory Visit (INDEPENDENT_AMBULATORY_CARE_PROVIDER_SITE_OTHER): Payer: Medicaid Other | Admitting: Cardiology

## 2015-06-20 VITALS — BP 113/75 | HR 67 | Ht 66.0 in | Wt 190.8 lb

## 2015-06-20 DIAGNOSIS — I10 Essential (primary) hypertension: Secondary | ICD-10-CM | POA: Diagnosis not present

## 2015-06-20 DIAGNOSIS — I5022 Chronic systolic (congestive) heart failure: Secondary | ICD-10-CM

## 2015-06-20 DIAGNOSIS — I2510 Atherosclerotic heart disease of native coronary artery without angina pectoris: Secondary | ICD-10-CM | POA: Diagnosis not present

## 2015-06-20 MED ORDER — SPIRONOLACTONE 25 MG PO TABS: 12.5000 mg | ORAL_TABLET | Freq: Every day | ORAL | Status: DC

## 2015-06-20 NOTE — Patient Instructions (Signed)
Your physician recommends that you schedule a follow-up appointment in: 3 MONTHS WITH DR. [place]  Your physician has recommended you make the following change in your medication:   START SPIRONOLACTONE 12.5 MG DAILY  Your physician recommends that you return for lab work in: 2 WEEKS BMP  Thank you for choosing [facility]!!
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
## 2015-07-08 ENCOUNTER — Encounter: Payer: Self-pay | Admitting: *Deleted

## 2015-07-12 ENCOUNTER — Other Ambulatory Visit: Payer: Self-pay | Admitting: Cardiology

## 2015-08-05 ENCOUNTER — Other Ambulatory Visit: Payer: Self-pay | Admitting: Cardiology

## 2015-08-05 ENCOUNTER — Other Ambulatory Visit: Payer: Self-pay | Admitting: *Deleted

## 2015-08-05 MED ORDER — CARVEDILOL 25 MG PO TABS: 25.0000 mg | ORAL_TABLET | Freq: Two times a day (BID) | ORAL | Status: DC

## 2015-09-08 ENCOUNTER — Other Ambulatory Visit: Payer: Self-pay | Admitting: *Deleted

## 2015-09-08 MED ORDER — LISINOPRIL 20 MG PO TABS: 20.0000 mg | ORAL_TABLET | Freq: Every day | ORAL | Status: DC

## 2015-09-16 ENCOUNTER — Encounter: Payer: Self-pay | Admitting: *Deleted

## 2015-09-16 ENCOUNTER — Ambulatory Visit (INDEPENDENT_AMBULATORY_CARE_PROVIDER_SITE_OTHER): Payer: Medicaid Other | Admitting: Cardiology

## 2015-09-16 ENCOUNTER — Encounter: Payer: Self-pay | Admitting: Cardiology

## 2015-09-16 VITALS — BP 118/76 | HR 85 | Ht 66.0 in | Wt 192.8 lb

## 2015-09-16 DIAGNOSIS — I2510 Atherosclerotic heart disease of native coronary artery without angina pectoris: Secondary | ICD-10-CM | POA: Diagnosis not present

## 2015-09-16 DIAGNOSIS — E785 Hyperlipidemia, unspecified: Secondary | ICD-10-CM

## 2015-09-16 DIAGNOSIS — I5022 Chronic systolic (congestive) heart failure: Secondary | ICD-10-CM
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
## 2015-09-16 NOTE — Patient Instructions (Signed)
Your physician recommends that you schedule a follow-up appointment in: 3 MONTHS WITH DR. Willowbrook  Your physician recommends that you continue on your current medications as directed. Please refer to the Current Medication list given to you today.  PLEASE BRING YOUR MEDICATIONS WITH YOU FOR A NURSE VISIT NEXT WEEK.  Thank you for choosing [facility]!!

## 2015-09-20 ENCOUNTER — Ambulatory Visit (INDEPENDENT_AMBULATORY_CARE_PROVIDER_SITE_OTHER): Payer: Medicaid Other | Admitting: *Deleted

## 2015-09-20 DIAGNOSIS — Z5181 Encounter for therapeutic drug level monitoring: Secondary | ICD-10-CM | POA: Diagnosis not present

## 2015-09-20 NOTE — Progress Notes (Signed)
Patient in office this morning for review of all his medication bottles.    See medication changes in EPIC.    His Coreg bottle is for 6.25mg  twice a day  - this was increased to 25mg  twice a day on 12/10/14.  Patient did not have bottles for the following:  Imdur, Toprol XL, Pravastatin, & Ultram.

## 2015-09-29 NOTE — Progress Notes (Signed)
RE: medication review  Received: 2 days ago    Arnoldo Lenis, MD  Laurine Blazer, LPN           I did, he can continue meds currently until our next follow up   Zandra Abts MD       Previous Messages     ----- Message -----   From: Laurine Blazer, LPN   Sent: [id number]  8:36 AM    To: Arnoldo Lenis, MD  Subject: medication review                 Did you get the nurse visit on this patient? This is the one you had come back for [place] to review all medication bottles. There were some changes noted.    Thanks,  Edd Fabian

## 2015-09-29 NOTE — Progress Notes (Signed)
Sister Mardene Celeste) notified & verbalized understanding.

## 2015-10-03 ENCOUNTER — Other Ambulatory Visit: Payer: Self-pay | Admitting: Cardiology

## 2015-10-25 ENCOUNTER — Other Ambulatory Visit: Payer: Self-pay | Admitting: Cardiology

## 2015-11-25 ENCOUNTER — Other Ambulatory Visit: Payer: Self-pay | Admitting: Cardiology

## 2015-12-19 ENCOUNTER — Encounter: Payer: Self-pay | Admitting: Cardiology

## 2015-12-19 ENCOUNTER — Ambulatory Visit (INDEPENDENT_AMBULATORY_CARE_PROVIDER_SITE_OTHER): Payer: Medicaid Other | Admitting: Cardiology

## 2015-12-19 VITALS — BP 108/81 | HR 89 | Ht 66.0 in | Wt 182.0 lb

## 2015-12-19 DIAGNOSIS — I5022 Chronic systolic (congestive) heart failure: Secondary | ICD-10-CM

## 2015-12-19 DIAGNOSIS — I2510 Atherosclerotic heart disease of native coronary artery without angina pectoris: Secondary | ICD-10-CM

## 2015-12-19 DIAGNOSIS — E785 Hyperlipidemia, unspecified: Secondary | ICD-10-CM

## 2015-12-19 DIAGNOSIS — I10 Essential (primary) hypertension: Secondary | ICD-10-CM | POA: Diagnosis not present

## 2015-12-19 NOTE — Patient Instructions (Signed)
Continue all current medications. Your physician wants you to follow up in: 6 months.  You will receive a reminder letter in the mail one-two months in advance.  If you don't receive a letter, please call our office to schedule the follow up appointment   
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
## 2015-12-20 ENCOUNTER — Encounter: Payer: Self-pay | Admitting: *Deleted

## 2015-12-26 ENCOUNTER — Other Ambulatory Visit: Payer: Self-pay | Admitting: Cardiology

## 2016-01-18 ENCOUNTER — Other Ambulatory Visit: Payer: Self-pay | Admitting: Cardiology

## 2016-02-13 ENCOUNTER — Other Ambulatory Visit: Payer: Self-pay | Admitting: Cardiology

## 2016-08-15 ENCOUNTER — Encounter: Payer: Self-pay | Admitting: Internal Medicine

## 2016-08-15 DIAGNOSIS — Z139 Encounter for screening, unspecified: Secondary | ICD-10-CM

## 2017-02-21 ENCOUNTER — Inpatient Hospital Stay (HOSPITAL_COMMUNITY): Payer: Medicaid Other

## 2017-02-21 ENCOUNTER — Encounter (HOSPITAL_COMMUNITY): Payer: Self-pay | Admitting: *Deleted

## 2017-02-21 ENCOUNTER — Encounter (HOSPITAL_COMMUNITY)
Admission: EM | Disposition: A | Discharge status: Home Health | Payer: Self-pay | Source: Home / Self Care | Attending: Cardiovascular Disease

## 2017-02-21 ENCOUNTER — Inpatient Hospital Stay (HOSPITAL_COMMUNITY)
Admission: EM | Admit: 2017-02-21 | Discharge: 2017-02-26 | DRG: 246 | Disposition: A | Discharge status: Home Health | Payer: Medicaid Other | Attending: Cardiovascular Disease | Admitting: Cardiovascular Disease

## 2017-02-21 DIAGNOSIS — I959 Hypotension, unspecified: Secondary | ICD-10-CM | POA: Diagnosis not present

## 2017-02-21 DIAGNOSIS — I70219 Atherosclerosis of native arteries of extremities with intermittent claudication, unspecified extremity: Secondary | ICD-10-CM | POA: Diagnosis not present

## 2017-02-21 DIAGNOSIS — K567 Ileus, unspecified: Secondary | ICD-10-CM | POA: Diagnosis present

## 2017-02-21 DIAGNOSIS — F419 Anxiety disorder, unspecified: Secondary | ICD-10-CM | POA: Diagnosis present

## 2017-02-21 DIAGNOSIS — Z955 Presence of coronary angioplasty implant and graft: Secondary | ICD-10-CM

## 2017-02-21 DIAGNOSIS — M6282 Rhabdomyolysis: Secondary | ICD-10-CM | POA: Diagnosis present

## 2017-02-21 DIAGNOSIS — K219 Gastro-esophageal reflux disease without esophagitis: Secondary | ICD-10-CM | POA: Diagnosis present

## 2017-02-21 DIAGNOSIS — I2109 ST elevation (STEMI) myocardial infarction involving other coronary artery of anterior wall: Secondary | ICD-10-CM | POA: Diagnosis not present

## 2017-02-21 DIAGNOSIS — Z7984 Long term (current) use of oral hypoglycemic drugs: Secondary | ICD-10-CM

## 2017-02-21 DIAGNOSIS — J810 Acute pulmonary edema: Secondary | ICD-10-CM | POA: Diagnosis not present

## 2017-02-21 DIAGNOSIS — Z8673 Personal history of transient ischemic attack (TIA), and cerebral infarction without residual deficits: Secondary | ICD-10-CM

## 2017-02-21 DIAGNOSIS — Z7982 Long term (current) use of aspirin: Secondary | ICD-10-CM | POA: Diagnosis not present

## 2017-02-21 DIAGNOSIS — E7800 Pure hypercholesterolemia, unspecified: Secondary | ICD-10-CM | POA: Diagnosis present

## 2017-02-21 DIAGNOSIS — I110 Hypertensive heart disease with heart failure: Secondary | ICD-10-CM | POA: Diagnosis present

## 2017-02-21 DIAGNOSIS — I2510 Atherosclerotic heart disease of native coronary artery without angina pectoris: Secondary | ICD-10-CM | POA: Diagnosis not present

## 2017-02-21 DIAGNOSIS — J9601 Acute respiratory failure with hypoxia: Secondary | ICD-10-CM | POA: Diagnosis not present

## 2017-02-21 DIAGNOSIS — E785 Hyperlipidemia, unspecified: Secondary | ICD-10-CM | POA: Diagnosis present

## 2017-02-21 DIAGNOSIS — I252 Old myocardial infarction: Secondary | ICD-10-CM

## 2017-02-21 DIAGNOSIS — M1990 Unspecified osteoarthritis, unspecified site: Secondary | ICD-10-CM | POA: Diagnosis present

## 2017-02-21 DIAGNOSIS — G4733 Obstructive sleep apnea (adult) (pediatric): Secondary | ICD-10-CM | POA: Diagnosis present

## 2017-02-21 DIAGNOSIS — R42 Dizziness and giddiness: Secondary | ICD-10-CM | POA: Diagnosis not present

## 2017-02-21 DIAGNOSIS — E1165 Type 2 diabetes mellitus with hyperglycemia: Secondary | ICD-10-CM | POA: Diagnosis present

## 2017-02-21 DIAGNOSIS — F329 Major depressive disorder, single episode, unspecified: Secondary | ICD-10-CM | POA: Diagnosis present

## 2017-02-21 DIAGNOSIS — Z9861 Coronary angioplasty status: Secondary | ICD-10-CM

## 2017-02-21 DIAGNOSIS — I5042 Chronic combined systolic (congestive) and diastolic (congestive) heart failure: Secondary | ICD-10-CM

## 2017-02-21 DIAGNOSIS — Z8249 Family history of ischemic heart disease and other diseases of the circulatory system: Secondary | ICD-10-CM

## 2017-02-21 DIAGNOSIS — Z9114 Patient's other noncompliance with medication regimen: Secondary | ICD-10-CM

## 2017-02-21 DIAGNOSIS — I2102 ST elevation (STEMI) myocardial infarction involving left anterior descending coronary artery: Principal | ICD-10-CM | POA: Diagnosis present

## 2017-02-21 DIAGNOSIS — I513 Intracardiac thrombosis, not elsewhere classified: Secondary | ICD-10-CM | POA: Diagnosis present

## 2017-02-21 DIAGNOSIS — R0902 Hypoxemia: Secondary | ICD-10-CM | POA: Diagnosis not present

## 2017-02-21 DIAGNOSIS — J9691 Respiratory failure, unspecified with hypoxia: Secondary | ICD-10-CM

## 2017-02-21 DIAGNOSIS — E876 Hypokalemia: Secondary | ICD-10-CM | POA: Diagnosis present

## 2017-02-21 DIAGNOSIS — Z87891 Personal history of nicotine dependence: Secondary | ICD-10-CM

## 2017-02-21 DIAGNOSIS — R51 Headache: Secondary | ICD-10-CM | POA: Diagnosis not present

## 2017-02-21 DIAGNOSIS — I5043 Acute on chronic combined systolic (congestive) and diastolic (congestive) heart failure: Secondary | ICD-10-CM | POA: Diagnosis present

## 2017-02-21 DIAGNOSIS — I509 Heart failure, unspecified: Secondary | ICD-10-CM

## 2017-02-21 DIAGNOSIS — I213 ST elevation (STEMI) myocardial infarction of unspecified site: Secondary | ICD-10-CM | POA: Diagnosis present

## 2017-02-21 DIAGNOSIS — R10819 Abdominal tenderness, unspecified site: Secondary | ICD-10-CM

## 2017-02-21 DIAGNOSIS — Z823 Family history of stroke: Secondary | ICD-10-CM

## 2017-02-21 HISTORY — PX: LEFT HEART CATH AND CORONARY ANGIOGRAPHY: CATH118249

## 2017-02-21 HISTORY — PX: CORONARY STENT INTERVENTION: CATH118234

## 2017-02-21 LAB — POCT I-STAT 3, ART BLOOD GAS (G3+)
Acid-Base Excess: 2 mmol/L (ref 0.0–2.0)
BICARBONATE: 26.3 mmol/L (ref 20.0–28.0)
O2 Saturation: 100 %
PCO2 ART: 38.1 mmHg (ref 32.0–48.0)
PH ART: 7.446 (ref 7.350–7.450)
PO2 ART: 363 mmHg — AB (ref 83.0–108.0)
TCO2: 27 mmol/L (ref 0–100)

## 2017-02-21 LAB — CBC
HCT: 42.6 % (ref 39.0–52.0)
Hemoglobin: 14.3 g/dL (ref 13.0–17.0)
MCH: 26.9 pg (ref 26.0–34.0)
MCHC: 33.6 g/dL (ref 30.0–36.0)
MCV: 80.1 fL (ref 78.0–100.0)
PLATELETS: 268 10*3/uL (ref 150–400)
RBC: 5.32 MIL/uL (ref 4.22–5.81)
RDW: 15 % (ref 11.5–15.5)
WBC: 10.1 10*3/uL (ref 4.0–10.5)

## 2017-02-21 LAB — COMPREHENSIVE METABOLIC PANEL
ALBUMIN: 3.1 g/dL — AB (ref 3.5–5.0)
ALT: 36 U/L (ref 17–63)
ANION GAP: 11 (ref 5–15)
AST: 37 U/L (ref 15–41)
Alkaline Phosphatase: 71 U/L (ref 38–126)
BILIRUBIN TOTAL: 0.4 mg/dL (ref 0.3–1.2)
BUN: 15 mg/dL (ref 6–20)
CHLORIDE: 102 mmol/L (ref 101–111)
CO2: 22 mmol/L (ref 22–32)
Calcium: 8.6 mg/dL — ABNORMAL LOW (ref 8.9–10.3)
Creatinine, Ser: 1.09 mg/dL (ref 0.61–1.24)
Glucose, Bld: 272 mg/dL — ABNORMAL HIGH (ref 65–99)
POTASSIUM: 3.7 mmol/L (ref 3.5–5.1)
SODIUM: 135 mmol/L (ref 135–145)
TOTAL PROTEIN: 6.3 g/dL — AB (ref 6.5–8.1)

## 2017-02-21 LAB — LIPASE, BLOOD: Lipase: 18 U/L (ref 11–51)

## 2017-02-21 LAB — PROTIME-INR
INR: 5.77
INR: 5.17 — AB
PROTHROMBIN TIME: 53.7 s — AB (ref 11.4–15.2)
Prothrombin Time: 48.4 seconds — ABNORMAL HIGH (ref 11.4–15.2)

## 2017-02-21 LAB — LIPID PANEL
CHOL/HDL RATIO: 3.8 ratio
CHOLESTEROL: 233 mg/dL — AB (ref 0–200)
HDL: 62 mg/dL (ref >40)
LDL Cholesterol: 158 mg/dL — ABNORMAL HIGH (ref 0–99)
TRIGLYCERIDES: 65 mg/dL (ref <150)
VLDL: 13 mg/dL (ref 0–40)

## 2017-02-21 LAB — BASIC METABOLIC PANEL
ANION GAP: 13 (ref 5–15)
BUN: 14 mg/dL (ref 6–20)
CALCIUM: 9 mg/dL (ref 8.9–10.3)
CO2: 23 mmol/L (ref 22–32)
Chloride: 100 mmol/L — ABNORMAL LOW (ref 101–111)
Creatinine, Ser: 1.06 mg/dL (ref 0.61–1.24)
GLUCOSE: 234 mg/dL — AB (ref 65–99)
POTASSIUM: 3.7 mmol/L (ref 3.5–5.1)
SODIUM: 136 mmol/L (ref 135–145)

## 2017-02-21 LAB — TROPONIN I
Troponin I: 0.08 ng/mL (ref <0.03)
Troponin I: 0.09 ng/mL (ref <0.03)
Troponin I: 0.1 ng/mL (ref <0.03)

## 2017-02-21 LAB — GLUCOSE, CAPILLARY: GLUCOSE-CAPILLARY: 197 mg/dL — AB (ref 65–99)

## 2017-02-21 LAB — MRSA PCR SCREENING: MRSA by PCR: NEGATIVE

## 2017-02-21 LAB — BRAIN NATRIURETIC PEPTIDE: B Natriuretic Peptide: 1119.4 pg/mL — ABNORMAL HIGH (ref 0.0–100.0)

## 2017-02-21 LAB — APTT: aPTT: 99 seconds — ABNORMAL HIGH (ref 24–36)

## 2017-02-21 LAB — POCT ACTIVATED CLOTTING TIME: ACTIVATED CLOTTING TIME: 362 s

## 2017-02-21 LAB — LACTIC ACID, PLASMA: LACTIC ACID, VENOUS: 1.8 mmol/L (ref 0.5–1.9)

## 2017-02-21 SURGERY — LEFT HEART CATH AND CORONARY ANGIOGRAPHY
Anesthesia: LOCAL

## 2017-02-21 MED ORDER — ONDANSETRON HCL 4 MG/2ML IJ SOLN: 4.0000 mg | Freq: Four times a day (QID) | INTRAMUSCULAR | Status: DC | PRN

## 2017-02-21 MED ORDER — ORAL CARE MOUTH RINSE
15.0000 mL | Freq: Two times a day (BID) | OROMUCOSAL | Status: DC
  Administered 2017-02-21 – 2017-02-24 (×4): 15 mL via OROMUCOSAL

## 2017-02-21 MED ORDER — FUROSEMIDE 10 MG/ML IJ SOLN
60.0000 mg | Freq: Two times a day (BID) | INTRAMUSCULAR | Status: DC
  Administered 2017-02-22 (×2): 60 mg via INTRAVENOUS
  Filled 2017-02-21 (×2): qty 6

## 2017-02-21 MED ORDER — LIDOCAINE HCL (PF) 1 % IJ SOLN
INTRAMUSCULAR | Status: DC | PRN
  Administered 2017-02-21: 5 mL

## 2017-02-21 MED ORDER — SODIUM CHLORIDE 0.9 % IV SOLN
1.7500 mg/kg/h | INTRAVENOUS | Status: AC
  Filled 2017-02-21 (×3): qty 250

## 2017-02-21 MED ORDER — HEPARIN (PORCINE) IN NACL 2-0.9 UNIT/ML-% IJ SOLN
INTRAMUSCULAR | Status: DC | PRN
  Administered 2017-02-21: 1000 mL

## 2017-02-21 MED ORDER — HYDRALAZINE HCL 20 MG/ML IJ SOLN: 5.0000 mg | INTRAMUSCULAR | Status: AC | PRN

## 2017-02-21 MED ORDER — SODIUM CHLORIDE 0.9 % IV SOLN
INTRAVENOUS | Status: DC | PRN
  Administered 2017-02-21 (×2): 1.75 mg/kg/h via INTRAVENOUS

## 2017-02-21 MED ORDER — HEPARIN (PORCINE) IN NACL 2-0.9 UNIT/ML-% IJ SOLN
INTRAMUSCULAR | Status: AC
  Filled 2017-02-21: qty 1000

## 2017-02-21 MED ORDER — NITROGLYCERIN 0.4 MG SL SUBL
0.4000 mg | SUBLINGUAL_TABLET | SUBLINGUAL | Status: DC | PRN
  Administered 2017-02-22: 0.4 mg via SUBLINGUAL
  Filled 2017-02-21: qty 1

## 2017-02-21 MED ORDER — FUROSEMIDE 10 MG/ML IJ SOLN
INTRAMUSCULAR | Status: DC | PRN
  Administered 2017-02-21: 40 mg via INTRAVENOUS

## 2017-02-21 MED ORDER — TICAGRELOR 90 MG PO TABS
90.0000 mg | ORAL_TABLET | Freq: Two times a day (BID) | ORAL | Status: AC
  Administered 2017-02-21 – 2017-02-22 (×3): 90 mg via ORAL
  Filled 2017-02-21 (×3): qty 1

## 2017-02-21 MED ORDER — CANGRELOR TETRASODIUM 50 MG IV SOLR
INTRAVENOUS | Status: AC
  Filled 2017-02-21: qty 50

## 2017-02-21 MED ORDER — NITROGLYCERIN 1 MG/10 ML FOR IR/CATH LAB
INTRA_ARTERIAL | Status: AC
  Filled 2017-02-21: qty 10

## 2017-02-21 MED ORDER — BIVALIRUDIN TRIFLUOROACETATE 250 MG IV SOLR
INTRAVENOUS | Status: AC
  Filled 2017-02-21: qty 250

## 2017-02-21 MED ORDER — CARVEDILOL 3.125 MG PO TABS
3.1250 mg | ORAL_TABLET | Freq: Two times a day (BID) | ORAL | Status: DC
Start: 2017-02-21 — End: 2017-02-26
  Administered 2017-02-21 – 2017-02-26 (×11): 3.125 mg via ORAL
  Filled 2017-02-21 (×11): qty 1

## 2017-02-21 MED ORDER — IOPAMIDOL (ISOVUE-370) INJECTION 76%
INTRAVENOUS | Status: DC | PRN
  Administered 2017-02-21: 165 mL via INTRA_ARTERIAL

## 2017-02-21 MED ORDER — TICAGRELOR 90 MG PO TABS
ORAL_TABLET | ORAL | Status: AC
  Filled 2017-02-21: qty 2

## 2017-02-21 MED ORDER — INSULIN ASPART 100 UNIT/ML ~~LOC~~ SOLN
0.0000 [IU] | Freq: Three times a day (TID) | SUBCUTANEOUS | Status: DC
  Administered 2017-02-21 – 2017-02-22 (×2): 2 [IU] via SUBCUTANEOUS
  Administered 2017-02-22 (×2): 1 [IU] via SUBCUTANEOUS
  Administered 2017-02-23: 2 [IU] via SUBCUTANEOUS
  Administered 2017-02-24: 1 [IU] via SUBCUTANEOUS
  Administered 2017-02-24: 3 [IU] via SUBCUTANEOUS
  Administered 2017-02-25 – 2017-02-26 (×2): 1 [IU] via SUBCUTANEOUS

## 2017-02-21 MED ORDER — TICAGRELOR 90 MG PO TABS
ORAL_TABLET | ORAL | Status: DC | PRN
  Administered 2017-02-21: 180 mg via ORAL

## 2017-02-21 MED ORDER — FUROSEMIDE 10 MG/ML IJ SOLN: 40.0000 mg | Freq: Two times a day (BID) | INTRAMUSCULAR | Status: DC

## 2017-02-21 MED ORDER — BIVALIRUDIN BOLUS VIA INFUSION - CUPID
INTRAVENOUS | Status: DC | PRN
  Administered 2017-02-21: 62.25 mg via INTRAVENOUS

## 2017-02-21 MED ORDER — SODIUM CHLORIDE 0.9 % IV SOLN: INTRAVENOUS | Status: AC

## 2017-02-21 MED ORDER — LABETALOL HCL 5 MG/ML IV SOLN: 10.0000 mg | INTRAVENOUS | Status: AC | PRN

## 2017-02-21 MED ORDER — SODIUM CHLORIDE 0.9 % IV SOLN: 250.0000 mL | INTRAVENOUS | Status: DC | PRN

## 2017-02-21 MED ORDER — ASPIRIN 81 MG PO CHEW: 81.0000 mg | CHEWABLE_TABLET | Freq: Every day | ORAL | Status: DC

## 2017-02-21 MED ORDER — FUROSEMIDE 10 MG/ML IJ SOLN
INTRAMUSCULAR | Status: AC
  Filled 2017-02-21: qty 4

## 2017-02-21 MED ORDER — ASPIRIN EC 81 MG PO TBEC
81.0000 mg | DELAYED_RELEASE_TABLET | Freq: Every day | ORAL | Status: DC
  Administered 2017-02-22 – 2017-02-26 (×5): 81 mg via ORAL
  Filled 2017-02-21 (×5): qty 1

## 2017-02-21 MED ORDER — ACETAMINOPHEN 325 MG PO TABS
650.0000 mg | ORAL_TABLET | ORAL | Status: DC | PRN
  Administered 2017-02-22 – 2017-02-25 (×5): 650 mg via ORAL
  Filled 2017-02-21 (×6): qty 2

## 2017-02-21 MED ORDER — PANTOPRAZOLE SODIUM 40 MG IV SOLR
40.0000 mg | Freq: Every day | INTRAVENOUS | Status: DC
  Administered 2017-02-21 – 2017-02-25 (×5): 40 mg via INTRAVENOUS
  Filled 2017-02-21 (×5): qty 40

## 2017-02-21 MED ORDER — SODIUM CHLORIDE 0.9% FLUSH
3.0000 mL | Freq: Two times a day (BID) | INTRAVENOUS | Status: DC
  Administered 2017-02-21: 3 mL via INTRAVENOUS
  Administered 2017-02-22: 10 mL via INTRAVENOUS
  Administered 2017-02-22: 3 mL via INTRAVENOUS
  Administered 2017-02-23: 10 mL via INTRAVENOUS
  Administered 2017-02-25 – 2017-02-26 (×3): 3 mL via INTRAVENOUS

## 2017-02-21 MED ORDER — IOPAMIDOL (ISOVUE-370) INJECTION 76%
INTRAVENOUS | Status: AC
  Filled 2017-02-21: qty 125

## 2017-02-21 MED ORDER — SODIUM CHLORIDE 0.9% FLUSH
3.0000 mL | INTRAVENOUS | Status: DC | PRN
  Administered 2017-02-24: 3 mL via INTRAVENOUS
  Filled 2017-02-21: qty 3

## 2017-02-21 MED ORDER — MORPHINE SULFATE (PF) 4 MG/ML IV SOLN
2.0000 mg | INTRAVENOUS | Status: DC | PRN
  Administered 2017-02-26: 2 mg via INTRAVENOUS
  Filled 2017-02-21: qty 1

## 2017-02-21 MED ORDER — CANGRELOR BOLUS VIA INFUSION
INTRAVENOUS | Status: DC | PRN
  Administered 2017-02-21: 2490 ug via INTRAVENOUS

## 2017-02-21 SURGICAL SUPPLY — 18 items
BALLN EUPHORA RX 2.0X12 (BALLOONS) ×2
BALLN ~~LOC~~ EUPHORA RX 2.5X12 (BALLOONS) ×2
BALLOON EUPHORA RX 2.0X12 (BALLOONS) ×1 IMPLANT
BALLOON ~~LOC~~ EUPHORA RX 2.5X12 (BALLOONS) ×1 IMPLANT
CATH INFINITI 5FR MULTPACK ANG (CATHETERS) ×2 IMPLANT
CATH VISTA GUIDE 6FR XBLAD3.5 (CATHETERS) ×2 IMPLANT
ELECT DEFIB PAD ADLT CADENCE (PAD) ×2 IMPLANT
KIT ENCORE 26 ADVANTAGE (KITS) ×2 IMPLANT
KIT HEART LEFT (KITS) ×2 IMPLANT
PACK CARDIAC CATHETERIZATION (CUSTOM PROCEDURE TRAY) ×2 IMPLANT
SHEATH PINNACLE 6F 10CM (SHEATH) ×2 IMPLANT
STENT SYNERGY DES 2.25X16 (Permanent Stent) ×2 IMPLANT
SYR MEDRAD MARK V 150ML (SYRINGE) ×2 IMPLANT
TRANSDUCER W/STOPCOCK (MISCELLANEOUS) ×2 IMPLANT
TUBING CIL FLEX 10 FLL-RA (TUBING) ×2 IMPLANT
WIRE ASAHI PROWATER 180CM (WIRE) ×2 IMPLANT
WIRE EMERALD 3MM-J .035X150CM (WIRE) ×2 IMPLANT
WIRE HI TORQ VERSACORE-J 145CM (WIRE) ×2 IMPLANT
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
## 2017-02-21 NOTE — Progress Notes (Signed)
Patient ID: Daniel Li, male   DOB: 14-Dec-1955, 61 y.o.   MRN: [id number]  Patient discussed with Dr. Gwenlyn Found.  HF team will take over care in am.   [name] 02/21/2017 9:18 PM

## 2017-02-21 NOTE — Progress Notes (Signed)
This note also relates to the following rows which could not be included: SpO2 - Cannot attach notes to unvalidated device data  RT assisted in pt transport from ED to cath lab on Bipap.  Pt's vitals remained stable throughout.

## 2017-02-21 NOTE — Care Management Note (Signed)
Case Management Note  Patient Details  Name: Daniel Li MRN: [id number] Date of Birth: 28-Feb-1956  Subjective/Objective:   Present from [place] with STEMI, Acute on Chronic HF, Acute resp Failure , HTN, , HLD, hx CVA.                  Action/Plan: NCM will follow for dc needs.  Expected Discharge Date:                  Expected Discharge Plan:     In-House Referral:     Discharge planning Services  CM Consult  Post Acute Care Choice:    Choice offered to:     DME Arranged:    DME Agency:     HH Arranged:    HH Agency:     Status of Service:  In process, will continue to follow  If discussed at Long Length of Stay Meetings, dates discussed:    Additional Comments:  Zenon Mayo, RN 02/21/2017, 3:25 PM
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
## 2017-02-21 NOTE — Progress Notes (Signed)
eLink Physician-Brief Progress Note Patient Name: Jasdeep Kepner Knueppel DOB: 04/19/1956 MRN: [id number]   Date of Service  02/21/2017  HPI/Events of Note  Ab x ray shows Ileus vs partial SBO  eICU Interventions  Keep NPO Monitor abd exam, x ray     Intervention Category Intermediate Interventions: Abdominal pain - evaluation and management  Marygrace Sandoval 02/21/2017, 6:34 PM

## 2017-02-21 NOTE — ED Notes (Signed)
Patiednt was trasferred to cath lab with RN, Kemper Durie,  MD and Resp therapy assisting

## 2017-02-21 NOTE — Progress Notes (Signed)
CRITICAL VALUE ALERT  Critical value received:  INR 5.17  Date of notification:  02/21/2017  Time of notification:  4680  Critical value read back:Yes.    Paged NP Mancel Bale to inform, NP to bedside to evaluate for orders.

## 2017-02-21 NOTE — Progress Notes (Signed)
    Called due to INR of 5.7, repeat done 5.1. No signs of bleeding at femoral site. Hemodynamically stable on assessment. Discussed with Dr. Gwenlyn Found. Will leave sheath in at this time given these results. Daily INRs. Blood pressure stable, therefore add low dose coreg now.   Barnet Pall, NP-C 02/21/2017, 4:34 PM Pager: [phone]

## 2017-02-21 NOTE — ED Triage Notes (Signed)
Patient transfer from [facility], ems states patient went to the ED c/o chest pain and sob, onset approx. 1 hour PTA. Patient was given Rocephin, Heparin, NTG, ASA, ativan, lasis and Mag at [facility], code stemi was called however patient was having breathing problems and stopped very briefly. Dr Gwenlyn Found at bedside , exam Patient and took emergent to cath lab.

## 2017-02-21 NOTE — Progress Notes (Signed)
CRITICAL VALUE ALERT  Critical value received:  INR 5.77  Date of notification:  4/26  Time of notification:  1500  Critical value read back:Yes.    Nurse who received alert:  Lexi  MD notified (1st page):  Mancel Bale, NP Cards  Time of first page:  1500  MD notified (2nd page):  Time of second page:  Responding MD:  Mancel Bale NP  Time MD responded:  [phone]

## 2017-02-21 NOTE — Progress Notes (Signed)
Called eLink and spoke with MD Mannam regarding patient's abdominal xray showing ileus.  MD Mannam advised to make patient NPO

## 2017-02-21 NOTE — ED Notes (Signed)
All belongings sent with patient including  His teeth to the cath lab.

## 2017-02-22 ENCOUNTER — Inpatient Hospital Stay (HOSPITAL_COMMUNITY): Payer: Medicaid Other

## 2017-02-22 DIAGNOSIS — I5042 Chronic combined systolic (congestive) and diastolic (congestive) heart failure: Secondary | ICD-10-CM

## 2017-02-22 DIAGNOSIS — R0902 Hypoxemia: Secondary | ICD-10-CM

## 2017-02-22 DIAGNOSIS — I5043 Acute on chronic combined systolic (congestive) and diastolic (congestive) heart failure: Secondary | ICD-10-CM

## 2017-02-22 DIAGNOSIS — I2510 Atherosclerotic heart disease of native coronary artery without angina pectoris: Secondary | ICD-10-CM

## 2017-02-22 DIAGNOSIS — E876 Hypokalemia: Secondary | ICD-10-CM

## 2017-02-22 LAB — GLUCOSE, CAPILLARY
GLUCOSE-CAPILLARY: 149 mg/dL — AB (ref 65–99)
GLUCOSE-CAPILLARY: 131 mg/dL — AB (ref 65–99)
Glucose-Capillary: 128 mg/dL — ABNORMAL HIGH (ref 65–99)
Glucose-Capillary: 167 mg/dL — ABNORMAL HIGH (ref 65–99)
Glucose-Capillary: 198 mg/dL — ABNORMAL HIGH (ref 65–99)

## 2017-02-22 LAB — ECHOCARDIOGRAM COMPLETE
Ao-asc: 37 cm
CHL CUP DOP CALC LVOT VTI: 7.57 cm
CHL CUP LV S' LATERAL: 4.26 cm/s
FS: 2 % — AB (ref 28–44)
Height: 66 in
IVS/LV PW RATIO, ED: 1.1
LA ID, A-P, ES: 37 mm
LA diam index: 2.1 cm/m2
LA vol A4C: 85.6 ml
LA vol index: 49.3 mL/m2
LA vol: 86.8 mL
LEFT ATRIUM END SYS DIAM: 37 mm
LV PW d: 12.6 mm — AB (ref 0.6–1.1)
LV TDI E'MEDIAL: 5.63
LVOT area: 4.52 cm2
LVOT peak vel: 53.5 cm/s
LVOTD: 24 mm
LVOTSV: 34 mL
Lateral S' vel: 13.3 cm/s
PISA EROA: 0.17 cm2
RV TAPSE: 13.7 mm
VTI: 129 cm
Weight: 2380.97 oz

## 2017-02-22 LAB — BASIC METABOLIC PANEL
ANION GAP: 8 (ref 5–15)
ANION GAP: 9 (ref 5–15)
BUN: 12 mg/dL (ref 6–20)
BUN: 15 mg/dL (ref 6–20)
CALCIUM: 8.8 mg/dL — AB (ref 8.9–10.3)
CHLORIDE: 107 mmol/L (ref 101–111)
CHLORIDE: 103 mmol/L (ref 101–111)
CO2: 25 mmol/L (ref 22–32)
CO2: 27 mmol/L (ref 22–32)
CREATININE: 1.12 mg/dL (ref 0.61–1.24)
Calcium: 8.9 mg/dL (ref 8.9–10.3)
Creatinine, Ser: 1.36 mg/dL — ABNORMAL HIGH (ref 0.61–1.24)
GFR calc non Af Amer: >60 mL/min (ref >60)
GFR calc non Af Amer: 55 mL/min — ABNORMAL LOW (ref >60)
Glucose, Bld: 136 mg/dL — ABNORMAL HIGH (ref 65–99)
Glucose, Bld: 159 mg/dL — ABNORMAL HIGH (ref 65–99)
Potassium: 3.9 mmol/L (ref 3.5–5.1)
Potassium: 4.1 mmol/L (ref 3.5–5.1)
SODIUM: 140 mmol/L (ref 135–145)
SODIUM: 139 mmol/L (ref 135–145)

## 2017-02-22 LAB — LIPID PANEL
CHOLESTEROL: 212 mg/dL — AB (ref 0–200)
HDL: 55 mg/dL (ref >40)
LDL Cholesterol: 140 mg/dL — ABNORMAL HIGH (ref 0–99)
Total CHOL/HDL Ratio: 3.9 RATIO
Triglycerides: 84 mg/dL (ref <150)
VLDL: 17 mg/dL (ref 0–40)

## 2017-02-22 LAB — CK: Total CK: 286 U/L (ref 49–397)

## 2017-02-22 LAB — CBC
HCT: 40.2 % (ref 39.0–52.0)
HEMOGLOBIN: 13.3 g/dL (ref 13.0–17.0)
MCH: 26.1 pg (ref 26.0–34.0)
MCHC: 33.1 g/dL (ref 30.0–36.0)
MCV: 79 fL (ref 78.0–100.0)
PLATELETS: 272 10*3/uL (ref 150–400)
RBC: 5.09 MIL/uL (ref 4.22–5.81)
RDW: 14.5 % (ref 11.5–15.5)
WBC: 12.8 10*3/uL — AB (ref 4.0–10.5)

## 2017-02-22 LAB — HEMOGLOBIN A1C
Hgb A1c MFr Bld: 6.4 % — ABNORMAL HIGH (ref 4.8–5.6)
Hgb A1c MFr Bld: 6.7 % — ABNORMAL HIGH (ref 4.8–5.6)
MEAN PLASMA GLUCOSE: 137 mg/dL
Mean Plasma Glucose: 146 mg/dL

## 2017-02-22 LAB — MAGNESIUM: MAGNESIUM: 2.1 mg/dL (ref 1.7–2.4)

## 2017-02-22 LAB — HIV ANTIBODY (ROUTINE TESTING W REFLEX): HIV Screen 4th Generation wRfx: NONREACTIVE

## 2017-02-22 LAB — PROTIME-INR
INR: 1.49
PROTHROMBIN TIME: 18.2 s — AB (ref 11.4–15.2)

## 2017-02-22 LAB — TROPONIN I: Troponin I: 0.12 ng/mL (ref <0.03)

## 2017-02-22 MED ORDER — PERFLUTREN LIPID MICROSPHERE
INTRAVENOUS | Status: AC
  Administered 2017-02-22: 2 mL
  Filled 2017-02-22: qty 10

## 2017-02-22 MED ORDER — CLOPIDOGREL BISULFATE 300 MG PO TABS
300.0000 mg | ORAL_TABLET | Freq: Once | ORAL | Status: AC
  Administered 2017-02-23: 300 mg via ORAL
  Filled 2017-02-22: qty 1

## 2017-02-22 MED ORDER — WARFARIN SODIUM 3 MG PO TABS
6.0000 mg | ORAL_TABLET | Freq: Once | ORAL | Status: AC
  Administered 2017-02-22: 6 mg via ORAL
  Filled 2017-02-22: qty 2

## 2017-02-22 MED ORDER — PERFLUTREN LIPID MICROSPHERE
1.0000 mL | INTRAVENOUS | Status: AC | PRN
  Administered 2017-02-22: 4 mL via INTRAVENOUS
  Filled 2017-02-22: qty 10

## 2017-02-22 MED ORDER — POTASSIUM CHLORIDE CRYS ER 20 MEQ PO TBCR
40.0000 meq | EXTENDED_RELEASE_TABLET | Freq: Once | ORAL | Status: AC
  Administered 2017-02-22: 40 meq via ORAL
  Filled 2017-02-22: qty 2

## 2017-02-22 MED ORDER — WARFARIN - PHARMACIST DOSING INPATIENT
Freq: Every day | Status: DC
  Administered 2017-02-22: 1

## 2017-02-22 MED ORDER — SPIRONOLACTONE 25 MG PO TABS
12.5000 mg | ORAL_TABLET | Freq: Every day | ORAL | Status: DC
  Administered 2017-02-22 – 2017-02-23 (×2): 12.5 mg via ORAL
  Filled 2017-02-22 (×2): qty 1

## 2017-02-22 MED ORDER — EZETIMIBE 10 MG PO TABS: 10.0000 mg | ORAL_TABLET | Freq: Every day | ORAL | Status: DC

## 2017-02-22 MED ORDER — HEPARIN (PORCINE) IN NACL 100-0.45 UNIT/ML-% IJ SOLN
1000.0000 [IU]/h | INTRAMUSCULAR | Status: DC
  Administered 2017-02-22: 800 [IU]/h via INTRAVENOUS
  Filled 2017-02-22: qty 250

## 2017-02-22 MED ORDER — LOSARTAN POTASSIUM 25 MG PO TABS
25.0000 mg | ORAL_TABLET | Freq: Every day | ORAL | Status: DC
  Administered 2017-02-22 – 2017-02-23 (×2): 25 mg via ORAL
  Filled 2017-02-22 (×2): qty 1

## 2017-02-22 MED ORDER — EZETIMIBE 10 MG PO TABS
10.0000 mg | ORAL_TABLET | Freq: Every day | ORAL | Status: DC
  Administered 2017-02-22 – 2017-02-26 (×5): 10 mg via ORAL
  Filled 2017-02-22 (×5): qty 1

## 2017-02-22 MED ORDER — CLOPIDOGREL BISULFATE 75 MG PO TABS
75.0000 mg | ORAL_TABLET | Freq: Every day | ORAL | Status: DC
  Administered 2017-02-24 – 2017-02-26 (×3): 75 mg via ORAL
  Filled 2017-02-22 (×3): qty 1

## 2017-02-22 MED FILL — Cangrelor Tetrasodium For IV Soln 50 MG: INTRAVENOUS | Qty: 50 | Status: AC

## 2017-02-22 MED FILL — Nitroglycerin IV Soln 100 MCG/ML in D5W: INTRA_ARTERIAL | Qty: 10 | Status: AC

## 2017-02-22 NOTE — Progress Notes (Signed)
[phone] Pt still on bedrest. MI education started with pt who voiced understanding. Stressed importance of brilinta with stent. Pt stated he has been on brilinta in the past. Needs to see case manager. Discussed smoking cessation and gave fake cigarette and handout. Gave diabetic and heart healthy diets. Pt has attended CRP 2 before in [place]. Will refer to [facility] again. Pt voiced understanding of ed and told me correct use of NTG for CP. Will followup tomorrow to walk and to complete ed. Pt stated he has had MI education before. Feel that pt knows what to do, just needs to be compliant. Graylon Good RN BSN 02/22/2017 2:26 PM

## 2017-02-22 NOTE — Progress Notes (Signed)
Pt not taking any medications. Called pt pharmacy and they have not filled any medications for pt since late last year. [facility] and they administered medications but needed a consent form and pt refused to sign. Pt requested to contact sister, she called back and verified that pt is not currently taking any medications.

## 2017-02-22 NOTE — Care Management Note (Addendum)
Case Management Note [name] RN, BSN Unit 2W-Case Manager [phone]  Patient Details  Name: Daniel Li MRN: [id number] Date of Birth: 06/17/1956  Subjective/Objective:    Pt admitted from [facility] with Stemi, s/p emergent cath- pt has been placed on Brilinta                Action/Plan: PTA pt lived at home- referral for medication needs received- pt has Medicaid- cost per med $3.70- Brilinta is covered under Medicaid- attempted to see pt at bedside to discuss d/c needs- pt resting (4/27 am)- CM will see pt prior to discharge and provide 30 day free card for Brilinta.   Expected Discharge Date:                  Expected Discharge Plan:     In-House Referral:     Discharge planning Services  CM Consult, Medication Assistance  Post Acute Care Choice:    Choice offered to:     DME Arranged:    DME Agency:     HH Arranged:    HH Agency:     Status of Service:  In process, will continue to follow  If discussed at Long Length of Stay Meetings, dates discussed:    Discharge Disposition:   Additional Comments:  02/23/17- 1530- [name] RN, CM- spoke with pt at bedside- per pt he lives alone- has a nephew that helps him and a sister. He indicates that he does not have difficulty getting his meds with Medicaid and uses [facility]. Per conversation with bedside RN- [name]- plan now is not to d/c on Brilinta and pt will be on ASA/plavix/coumadin due to thrombus that was found. Pt may benefit from [facility] at discharge for medication management- would not qualify for HHPT/OT as he does not have a qualifying dx under medicaid. CM will continue to follow for d/c needs  Dawayne Patricia, RN 02/22/2017, 11:24 AM

## 2017-02-22 NOTE — Progress Notes (Addendum)
Patient ID: Daniel Li, male   DOB: [birth year], 61 y.o.   MRN: [id number]     CARDIOLOGY CONSULT NOTE  Patient ID: Daniel Li MRN: [id number] DOB/AGE: 31-Aug-1956 61 y.o.  Admit date: 02/21/2017 Primary Physician: Legrand Rams Primary Cardiologist: Harl Bowie Reason for Consultation: CHF  HPI: We were asked to see Daniel Li in consultation for CHF by Dr. Gwenlyn Found  62 yo with long history of CAD s/p multiple ACS episodes, active smoking, HTN, hyperlipidemia, and DM presented this admission with anterior STEMI and pulmonary edema.  On talking with him, it seems like he has been out of a number of his medications (he is not really sure which ones he has been taking and not taking).  Symptoms began around a week ago with bilateral leg cramps (calves/thighs).  This was so bad he had to walk with a cane. 4-5 days prior to admission, he started to develop central chest pain and then severe exertional dyspnea.  When he finally decided to come to the hospital, he was markedly short of breath.   In the ER, he was noted to have anterior STEMI on ECG and pulmonary edema on CXR.  He was given Lasix IV and sent to the cath lab for coronary angiography. This showed old total occlusion of LCx with collaterals, old total occlusion PLV, and new occlusion of the proximal LAD.  There were, of note, some collaterals to LAD territory.  Patient had DES to LAD.  He is no longer having chest pain.  Interestingly, troponin only rose to 0.12.    This morning, no chest pain.  Breathing improved with good diuresis overnight.  Still having pain in legs. Still has right femoral sheath (INR was high yesterday but likely inaccurate, fine this morning for sheath removal).   Review of systems complete and found to be negative unless listed above in HPI  Past Medical History: 1. OSA: Supposed to be using CPAP.  2. HTN 3. CAD: Long history.  - NSTMEI 2007 with BMS D1 and OM2. - STEMI 5/13 with BMS RCA - NSTEMI 1/14 with  CTO LCx and PLV, unable to open LCx.  - STEMI 4/18 with LHC showing old TO LCx with collaterals, old TO PLV, 70% ostial D1, totally occluded proximal LAD with some collaterals => DES to LAD.  4. Depression 5. Type II DM 6. Hyperlipidemia: History of rhabdomyolysis with statins, he is on Zetia.  7. Smoker 8. Chronic systolic CHF: Ischemic cardiomyopathy.  Last echo from 9/16 showed improvement in EF back to 55-60%. However, LV-gram this admission with EF 25%.   Family History  Problem Relation Age of Onset  . Stroke Mother   . Heart attack Mother   . Heart attack Father   . Stroke Sister   . Heart attack Sister   . Heart attack Brother   . Stroke Brother   . Liver disease Neg Hx   . Colon cancer Neg Hx     Social History   Social History  . Marital status: Single    Spouse name: N/A  . Number of children: 1  . Years of education: N/A   Occupational History  . Not on file.   Social History Main Topics  . Smoking status: Former Smoker    Packs/day: 0.50    Years: 20.00    Types: Cigarettes    Start date: 07/21/1977    Quit date: 10/29/2014  . Smokeless tobacco: Never Used     Comment: cutting back to 1  or 2 cigarettes every 3 days  . Alcohol use 1.8 oz/week    3 Cans of beer per week     Comment: 16 ounce per day  . Drug use: No     Comment: previously incarcerated for drug related offense.  [name] Kitchen Sexual activity: Not Currently   Other Topics Concern  . Not on file   Social History Narrative  . No narrative on file     Prescriptions Prior to Admission  Medication Sig Dispense Refill Last Dose  . aspirin EC 81 MG tablet Take 81 mg by mouth daily.   Taking  . carvedilol (COREG) 25 MG tablet Take 25 mg by mouth 2 (two) times daily with a meal.   Taking  . cyclobenzaprine (FLEXERIL) 10 MG tablet Take 10 mg by mouth 2 (two) times daily as needed for muscle spasms.   Taking  . furosemide (LASIX) 40 MG tablet TAKE 1 TABLET DAILY. 30 tablet 6   . gabapentin (NEURONTIN) 100  MG capsule Take 100 mg by mouth 3 (three) times daily.   Taking  . glipiZIDE (GLUCOTROL XL) 10 MG 24 hr tablet Take 1 tablet (10 mg total) by mouth daily. 30 tablet 1 Taking  . hydrALAZINE (APRESOLINE) 25 MG tablet TAKE 1 TABLET BY MOUTH 3 TIMES DAILY. 90 tablet 6   . lisinopril (PRINIVIL,ZESTRIL) 20 MG tablet TAKE (1) TABLET BY MOUTH ONCE DAILY. 30 tablet 6   . methocarbamol (ROBAXIN) 500 MG tablet Take 500 mg by mouth every 8 (eight) hours as needed for muscle spasms.   Taking  . nitroGLYCERIN (NITROSTAT) 0.4 MG SL tablet PLACE ONE (1) TABLET UNDER TONGUE EVERY 5 MINUTES UP TO (3) DOSES AS NEEDED FOR CHEST PAIN. 25 tablet 3   . spironolactone (ALDACTONE) 25 MG tablet TAKE (1/2) TABLET BY MOUTH DAILY. 15 tablet 3   . ZETIA 10 MG tablet TAKE 1 TABLET ONCE DAILY. 30 tablet 6 Taking    Physical exam Blood pressure 107/89, pulse 94, temperature 98.5 F (36.9 C), temperature source Oral, resp. rate 20, height 5\' 6"  (1.676 m), weight 148 lb 13 oz (67.5 kg), SpO2 95 %. General: NAD Neck: JVP 9-10 cm, no thyromegaly or thyroid nodule.  Lungs: Clear to auscultation bilaterally with normal respiratory effort. CV: Nondisplaced PMI.  Heart regular S1/S2, no S3/S4, no murmur.  No peripheral edema.  No carotid bruit.  Trace bilateral PT pulses.  Abdomen: Soft, mild peri-umbilical tenderness, bowel sounds present, no hepatosplenomegaly, no distention.  Skin: Intact without lesions or rashes.  Neurologic: Alert and oriented x 3.  Psych: Normal affect. Extremities: No clubbing or cyanosis. Right femoral sheath still in place.  HEENT: Normal.   Labs:   Lab Results  Component Value Date   WBC 12.8 (H) 02/22/2017   HGB 13.3 02/22/2017   HCT 40.2 02/22/2017   MCV 79.0 02/22/2017   PLT 272 02/22/2017    Recent Labs Lab 02/21/17 1302  02/22/17 0410  NA 135  < > 140  K 3.7  < > 3.9  CL 102  < > 107  CO2 22  < > 25  BUN 15  < > 12  CREATININE 1.09  < > 1.12  CALCIUM 8.6*  < > 8.8*  PROT 6.3*  --    --   BILITOT 0.4  --   --   ALKPHOS 71  --   --   ALT 36  --   --   AST 37  --   --  GLUCOSE 272*  < > 136*  < > = values in this interval not displayed.   TnI 0.12 BNP 1119  Radiology: - Abdominal XR: Ileus versus possible SBO - CXR: Pulmonary edema  EKG (Personally reviewed): NSR, anterior MI  ASSESSMENT AND PLAN: 61 yo with long history of CAD s/p multiple ACS episodes, active smoking, HTN, hyperlipidemia, and DM presented this admission with anterior STEMI and pulmonary edema.  1. CAD: Recent anterior STEMI.  Extensive prior history of CAD.  He has known total occlusion LCx and PLV, DES to totally occluded LAD this admission.  CHF symptoms seemed more prominent and TnI only rose to 0.12, it is possible that his actual event (LAD occlusion) was a number of days ago based on symptom timing.  He did have some collaterals to the LAD system.  No further chest pain.   - Continue ASA 81 and ticagrelor.  - No statin given history of rhabdomyolysis.  LDL 153.  Will continue Zetia (not sure if he was taking at home).  Ideally, will need to start PCSK9-inhibitor.  - Remove arterial sheath.  2. Acute on chronic systolic CHF: Ischemic cardiomyoapthy.  EF 25% on LV-gram, LVEDP 38 on cath 4/26.  Pulmonary edema on admission CXR.  He diuresed well overnight, feels better today.  Still volume overloaded on exam.  - Will need formal echo.  - Continue Lasix 60 mg IV bid.  - Start spironolactone 12.5 mg daily and losartan 25 mg daily (questionable whether or not he was taking lisinopril at home, probably not => will give losartan today and transition over to [facility] tomorrow if BP stable).  - Continue low dose Coreg 3.125 mg bid for now, will not titrate up until CHF controlled.  3. Diabetes: Per primary service.  4. Smoking: Strongly encouraged to quit.  5. Leg pain/cramps: Bilateral.  Has history of rhabdomyolysis.  Unlikely to have recurred but will send CK level.  Weak pedal pulses, will check  peripheral arterial dopplers.  6. Abdominal pain: AXR suggests ileus, less likely partial SBO.  Has bowel sounds this morning, no nausea/vomiting.  Currently NPO with meds with sips but suspect he could have a trial of liquids soon.   Work with cardiac rehab after sheath out/bed rest complete.   Signed: [name] 02/22/2017 8:32 AM  LV thrombus noted on echo.  Start heparin gtt/overlap with warfarin 8 hrs after sheath is out. He will need to transition from ticagrelor to clopidogrel prior to discharge, stop ASA 81 at 1 month.   [name] 02/22/2017 3:02 PM

## 2017-02-22 NOTE — Progress Notes (Signed)
Pt c/o throbbing chest pain right to mid location.  RN made aware.  EKG completed -- no significant changes to be noted.  RN gave PRN 0.4 SL nitro X1 and patient felt relief with no c/o of pain.    RN will continue to monitor patient closely.

## 2017-02-22 NOTE — Progress Notes (Signed)
  Echocardiogram 2D Echocardiogram has been performed.  Bobbye Charleston 02/22/2017, 3:15 PM

## 2017-02-22 NOTE — Progress Notes (Signed)
Pt right femoral sheath removed. Pressure held for twenty minutes. No bleeding, hematoma, or hypotension noted. Pt tolerated fairly well. Education provided about maintaining pressure to site when coughing, sneezing, laughing, and straight leg time. Pt verbalized understanding of teaching. Site level zero at this time. Pressure dressing applied. Will continue to monitor.

## 2017-02-22 NOTE — Progress Notes (Signed)
Patient name: Daniel Li Medical record number: [id number] Date of birth: 04-Feb-1956 Age: 61 y.o. Gender: male PCP: Rosita Fire, MD  Date: 02/22/2017 Reason for Consult: STEMI, acute on chronic CHF exacerbation with pulmonary edema   Referring Physician: Dr. Gwenlyn Found   HPI: 61 year old man with PMH CAD s/p multiple caths and bare metal stents x3, chronic combined CHF (06/2014 EF 35-40%, G1DD), HTN, HLD, OSA on CPAP, and CVA who presented from [facility] with STEMI.   He was transferred from [facility] where he presented complaining of intermittent chest pain, cough, dyspnea, and weakness for the past 3 weeks. In the ED his chest xray showed bilateral pleural effusions with infiltrates. EKG showed acute anterior ST segment elevation. There he was given heparin, aspirin, sublingual nitroglycerine, IV lasix 40 mg, mag, IV rocephin, IV steroids, and breathing treatments. He developed respiratory distress and was placed on BiPAP. Code STEMI was called and he was transferred to [facility] via EMS. In the ED EKG showed ST segment elevations in V1-V3 and troponin 0.08 and he was taken to cath lab emergently.   He follows with Dr. Harl Bowie for his CAD, he was last seen 11/2015 and was not having chest pain, shortness of breath, dyspnea on exertion, or lower extremity edema at that time.   Physical Exam:  Temp:  [96.6 F (35.9 C)-98.6 F (37 C)] 98.5 F (36.9 C) (04/27 0300) Pulse Rate:  [0-109] 94 (04/27 0700) Resp:  [0-53] 20 (04/27 0700) BP: (90-137)/(74-111) 107/89 (04/27 0700) SpO2:  [0 %-100 %] 95 % (04/27 0700) Arterial Line BP: (107-142)/(73-109) 114/73 (04/27 0700) FiO2 (%):  [100 %] 100 % (04/26 1348) Weight:  [148 lb 13 oz (67.5 kg)] 148 lb 13 oz (67.5 kg) (04/26 1348)  Intake/Output Summary (Last 24 hours) at 02/22/17 0738 Last data filed at 02/22/17 8242  Gross per 24 hour  Intake              462 ml  Output             2810 ml  Net            -2348 ml   Physical  exam  General: no acute distress, well developed, well nourished, appears stated age Neuro: awake, alert, oriented x3, conversational, moving all extremities HEENT: moist mucous membranes, PERRLA  Cardiovascular: RRR, no appreciated murmur, no peripheral edema  Pulmonary: On [place], crackles on bases, breathing comfortably. Abdomen: Soft, non distended, tenderness and guarding to palpation over the umbilicus and epigastrum Skin: Intact, warm, dry    LAB RESULTS BMET    Component Value Date/Time   NA 140 02/22/2017 0410   K 3.9 02/22/2017 0410   CL 107 02/22/2017 0410   CO2 25 02/22/2017 0410   GLUCOSE 136 (H) 02/22/2017 0410   BUN 12 02/22/2017 0410   CREATININE 1.12 02/22/2017 0410   CREATININE 0.88 07/08/2012 1226   CALCIUM 8.8 (L) 02/22/2017 0410   GFRNONAA >60 02/22/2017 0410   GFRAA >60 02/22/2017 0410   CBC    Component Value Date/Time   WBC 12.8 (H) 02/22/2017 0410   RBC 5.09 02/22/2017 0410   HGB 13.3 02/22/2017 0410   HCT 40.2 02/22/2017 0410   PLT 272 02/22/2017 0410   MCV 79.0 02/22/2017 0410   MCH 26.1 02/22/2017 0410   MCHC 33.1 02/22/2017 0410   RDW 14.5 02/22/2017 0410   LYMPHSABS 2.0 07/20/2014 1010   MONOABS 0.7 07/20/2014 1010   EOSABS 0.0 07/20/2014 1010   BASOSABS  0.0 07/20/2014 1010   ABG    Component Value Date/Time   PHART 7.446 02/21/2017 1520   HCO3 26.3 02/21/2017 1520   TCO2 27 02/21/2017 1520   O2SAT 100.0 02/21/2017 1520   Radiology Assessment and plan: Patient Active Problem List   Diagnosis Date Noted  . Acute ST elevation myocardial infarction (STEMI) involving left anterior descending (LAD) coronary artery ([place]) 02/21/2017  . STEMI (ST elevation myocardial infarction) ([place]) 02/21/2017  . Heart attack ([place])   . Acute pulmonary edema (HCC)   . Acute respiratory failure with hypoxia ([place])   . Ischemic cardiomyopathy: EF 35% on cath 06/30/14, new from Jan 2015; Confirmed on Echo with Inferior Hypokinesis 07/20/2014  . HCAP  (healthcare-associated pneumonia) 07/19/2014  . Chest pain 07/19/2014  . Unstable angina ([place]) 06/29/2014  . High risk medication use 01/28/2013  . Special screening for malignant neoplasms, colon 01/28/2013  . Hypercholesterolemia   . Tobacco abuse   . Diabetes mellitus type 2 with complications ([place]) 48/54/6270  . Hypertension, essential 03/26/2012  . CAD (coronary artery disease) 03/26/2012   Acute Respiratory Failure 2/2 pulmonary edema in the setting of acute on chronic heart failure in the setting of acute STEMI Patient is breathing comfortably at this time but has crackles on pulmonary exam. -2.3 L yesterday. Will need further diuresis today.  CHF team on board, diuresis per their rec fup ECHO.  Monitor resp status, hopefully will not need escalation as we diurese further.   Acute anterior STEMI with hx of signifcant CAD including multiple stents in the past due to NSTEMI and STEMI.  Noncompliance with meds HLD - hx of statin induced myopathy, on zetia  Taken for emergent cath at presentation, a drug eluding stent was placed in his LAD. He has been started on Brilinta Lipid profile at admission revealed elevated LDL 158, he is on Zetia at home but ?compliance. May benefit from PCSK9 inhibitor, would benefit from the lipid clinic.   GI prophylaxis  Received Solumedrol at presentation.  -Ordered IV protonix 40 mg daily   Epigastric tenderness , possible ileus He has had abdominal pain for weeks, and had 3 episodes of non bloody non bilious vomiting prior to admissiont, subjective fever, diaphoresis, and decreased appetite.  - ABD xray showed possible ileus. Patient is NPO. Bowel rest for now. Assess for improvement. No NGT needed   Hyperglycemia  No HgbA1c since 10/2012 in our system, glipizide is on his home medication list. Sensitive sliding scale and CBG with meals and qHS ordered.     DVT prophylaxis  - ordered SCDs for now, had elevated INR  Elevated INR - unclear  etiology, resolved on repeat, no active bleeding noted,  - f/up INR and CBC  Attending Note:  61 year old male with CAD, DM and high cholesterol who presents to the hospital with a STEMI.  Patient was taken to the cath lab and multiple stents were placed.  Patient developed respiratory failure that was hypoxemic requiring BiPAP.  Patient was diuresed overnight and slowly BiPAP is coming off.  On exam, bibasilar crackles noted.  I reviewed CXR myself, pulmonary edema noted.  Discussed with PCCM-resident.  Acute respiratory failure:  - BiPAP to PRN   Acute pulmonary edema:  - Lasix 60 mg IV BID  Hypokalemia:  - Replace specially with lasix  - BMET in AM  Hypoxemia:  - Titrate O2 for sat of 88-92%.  PCCM will sign off, please call back if needed.  Patient seen and examined,  agree with above note.  I dictated the care and orders written for this patient under my direction.  Rush Farmer, MD [phone]

## 2017-02-22 NOTE — Progress Notes (Signed)
THIS PATIENT HAS [place] MEDICAID  CO-PAY- $ 3.70 FOR EACH RX  NO OTHER INS

## 2017-02-22 NOTE — Progress Notes (Signed)
ANTICOAGULATION CONSULT NOTE - Initial Consult  Pharmacy Consult:  Heparin / Coumadin (Overlap D#1) Indication:  LV thrombus  Allergies  Allergen Reactions  . Pork-Derived Products Other (See Comments)    Does not eat for religious reasons    Patient Measurements: Height: 5\' 6"  (167.6 cm) Weight: 148 lb 13 oz (67.5 kg) IBW/kg (Calculated) : 63.8 Heparin Dosing Weight: 64 kg  Vital Signs: Temp: 98.2 F (36.8 C) (04/27 1100) Temp Source: Oral (04/27 1100) BP: 104/84 (04/27 1400) Pulse Rate: 95 (04/27 1400)  Labs:  Recent Labs  02/21/17 1302 02/21/17 1356 02/21/17 1405 02/21/17 1514 02/21/17 1918 02/22/17 0410 02/22/17 0428  HGB 14.3  --   --   --   --  13.3  --   HCT 42.6  --   --   --   --  40.2  --   PLT 268  --   --   --   --  272  --   APTT  --   --  99*  --   --   --   --   LABPROT  --   --  53.7* 48.4*  --  18.2*  --   INR  --   --  5.77* 5.17*  --  1.49  --   CREATININE 1.09 1.06  --   --   --  1.12  --   CKTOTAL  --   --   --   --   --   --  286  TROPONINI 0.08* 0.09*  --   --  0.10*  --  0.12*    Estimated Creatinine Clearance: 63.3 mL/min (by C-G formula based on SCr of 1.12 mg/dL).   Medical History: Past Medical History:  Diagnosis Date  . Anxiety   . Arthritis   . Burn   . CAD (coronary artery disease)    a.. NSTEMI s/p BMS to 1st Diagonal and distal OM2 in 2007; b. STEMI 03/26/12 s/p BMS to RCA; c. NSTEMI 10/2012 cath  chronic occlusion of LCx (unable to open with PCI) and PL branch, mod dz of LAD and diagonal, and preserved LV systolic fxn, Med Rx;  d. 06/2014 Myoview:  EF 30%, large area of reversibility in inflat wall.  . Cardiomyopathy EF 35% on cath 06/30/14, new from jan 2015 07/20/2014  . Chronic arm pain   . Chronic back pain   . Chronic diastolic CHF (congestive heart failure) ([place])    a. 10/2012 Echo: EF 50-55%, mild HK of dist antsept myocardium, Gr 1 DD.  [name] Kitchen DDD (degenerative disc disease), cervical   . Depression   . GERD  (gastroesophageal reflux disease)   . HTN (hypertension)   . Hypercholesterolemia   . Mental disorder   . Myocardial infarction ([place])   . Sciatic pain   . Shortness of breath   . Stroke [facility])    a. multiple dating back to 2002.  . Tibia fracture (l) leg  . Tobacco abuse   . Type 2 diabetes mellitus (HCC)       Assessment: 54 YOM with significant cardiac history presented from [facility] with a STEMI.  He underwent cardiac catheterization yesterday and sheath removal was delayed until this AM due to an elevated INR.  INR rechecked and it was at 1.49, and the sheath was removed at 0900.  Pharmacy consulted to start IV heparin 8 hours post sheath removal and also Coumadin for an LV thrombus found on the ECHO.  No bleeding nor hematoma  per RN.  Patient does not eat/use pork-derived products due to religious reasons.  Spoke to patient and he agrees to be on IV heparin.   Goal of Therapy:  Heparin level 0.3-0.7 units/ml Monitor platelets by anticoagulation protocol: Yes    Plan:  - At 1700, start heparin gtt at 800 units/hr, no bolus post cath - Check 6 hr heparin level - Coumadin 6mg  PO today - Daily PT / INR, CBC, heparin level   Nyleah Mcginnis D. Mina Marble, PharmD, BCPS Pager:  [phone] 02/22/2017, 3:31 PM

## 2017-02-23 LAB — CBC
HCT: 42.6 % (ref 39.0–52.0)
HEMOGLOBIN: 14.1 g/dL (ref 13.0–17.0)
MCH: 26.3 pg (ref 26.0–34.0)
MCHC: 33.1 g/dL (ref 30.0–36.0)
MCV: 79.3 fL (ref 78.0–100.0)
Platelets: 253 10*3/uL (ref 150–400)
RBC: 5.37 MIL/uL (ref 4.22–5.81)
RDW: 14.7 % (ref 11.5–15.5)
WBC: 8.5 10*3/uL (ref 4.0–10.5)

## 2017-02-23 LAB — GLUCOSE, CAPILLARY
GLUCOSE-CAPILLARY: 123 mg/dL — AB (ref 65–99)
GLUCOSE-CAPILLARY: 121 mg/dL — AB (ref 65–99)
GLUCOSE-CAPILLARY: 151 mg/dL — AB (ref 65–99)
Glucose-Capillary: 153 mg/dL — ABNORMAL HIGH (ref 65–99)

## 2017-02-23 LAB — MAGNESIUM: MAGNESIUM: 2 mg/dL (ref 1.7–2.4)

## 2017-02-23 LAB — BASIC METABOLIC PANEL
ANION GAP: 12 (ref 5–15)
Anion gap: 7 (ref 5–15)
BUN: 16 mg/dL (ref 6–20)
BUN: 12 mg/dL (ref 6–20)
CALCIUM: 8.9 mg/dL (ref 8.9–10.3)
CHLORIDE: 104 mmol/L (ref 101–111)
CO2: 26 mmol/L (ref 22–32)
CO2: 27 mmol/L (ref 22–32)
Calcium: 8.8 mg/dL — ABNORMAL LOW (ref 8.9–10.3)
Chloride: 101 mmol/L (ref 101–111)
Creatinine, Ser: 1.21 mg/dL (ref 0.61–1.24)
Creatinine, Ser: 1.09 mg/dL (ref 0.61–1.24)
GFR calc Af Amer: >60 mL/min (ref >60)
GFR calc non Af Amer: >60 mL/min (ref >60)
GFR calc non Af Amer: >60 mL/min (ref >60)
GLUCOSE: 109 mg/dL — AB (ref 65–99)
Glucose, Bld: 109 mg/dL — ABNORMAL HIGH (ref 65–99)
POTASSIUM: 4.1 mmol/L (ref 3.5–5.1)
Potassium: 3.4 mmol/L — ABNORMAL LOW (ref 3.5–5.1)
SODIUM: 138 mmol/L (ref 135–145)
Sodium: 139 mmol/L (ref 135–145)

## 2017-02-23 LAB — PROTIME-INR
INR: 1.53
Prothrombin Time: 18.6 seconds — ABNORMAL HIGH (ref 11.4–15.2)

## 2017-02-23 LAB — PHOSPHORUS: Phosphorus: 3.3 mg/dL (ref 2.5–4.6)

## 2017-02-23 LAB — HEPARIN LEVEL (UNFRACTIONATED)
HEPARIN UNFRACTIONATED: 0.27 [IU]/mL — AB (ref 0.30–0.70)
HEPARIN UNFRACTIONATED: 0.46 [IU]/mL (ref 0.30–0.70)
Heparin Unfractionated: <0.1 IU/mL — ABNORMAL LOW (ref 0.30–0.70)

## 2017-02-23 MED ORDER — HEPARIN (PORCINE) IN NACL 100-0.45 UNIT/ML-% IJ SOLN
1050.0000 [IU]/h | INTRAMUSCULAR | Status: DC
  Administered 2017-02-23 – 2017-02-24 (×2): 1150 [IU]/h via INTRAVENOUS
  Administered 2017-02-25: 1050 [IU]/h via INTRAVENOUS
  Filled 2017-02-23 (×3): qty 250

## 2017-02-23 MED ORDER — WARFARIN SODIUM 3 MG PO TABS
6.0000 mg | ORAL_TABLET | Freq: Once | ORAL | Status: AC
  Administered 2017-02-23: 6 mg via ORAL
  Filled 2017-02-23: qty 2

## 2017-02-23 MED ORDER — FUROSEMIDE 40 MG PO TABS
40.0000 mg | ORAL_TABLET | Freq: Every day | ORAL | Status: DC
  Administered 2017-02-24 – 2017-02-26 (×3): 40 mg via ORAL
  Filled 2017-02-23 (×3): qty 1

## 2017-02-23 MED ORDER — POTASSIUM CHLORIDE CRYS ER 20 MEQ PO TBCR
40.0000 meq | EXTENDED_RELEASE_TABLET | Freq: Once | ORAL | Status: AC
  Administered 2017-02-23: 40 meq via ORAL
  Filled 2017-02-23: qty 2

## 2017-02-23 NOTE — Progress Notes (Signed)
ANTICOAGULATION CONSULT NOTE - Follow Up Consult  Pharmacy Consult for Heparin  Indication: LV thrombus  Allergies  Allergen Reactions  . Pork-Derived Products Other (See Comments)    Does not eat for religious reasons    Patient Measurements: Height: 5\' 6"  (167.6 cm) Weight: 148 lb 13 oz (67.5 kg) IBW/kg (Calculated) : 63.8  Vital Signs: Temp: 98.4 F (36.9 C) (04/27 2330) Temp Source: Oral (04/27 2330) BP: 87/58 (04/28 0032) Pulse Rate: 81 (04/28 0032)  Labs:  Recent Labs  02/21/17 1302 02/21/17 1356 02/21/17 1405 02/21/17 1514 02/21/17 1918 02/22/17 0410 02/22/17 0428 02/22/17 1626 02/22/17 2325  HGB 14.3  --   --   --   --  13.3  --   --   --   HCT 42.6  --   --   --   --  40.2  --   --   --   PLT 268  --   --   --   --  272  --   --   --   APTT  --   --  99*  --   --   --   --   --   --   LABPROT  --   --  53.7* 48.4*  --  18.2*  --   --   --   INR  --   --  5.77* 5.17*  --  1.49  --   --   --   HEPARINUNFRC  --   --   --   --   --   --   --   --  <0.10*  CREATININE 1.09 1.06  --   --   --  1.12  --  1.36*  --   CKTOTAL  --   --   --   --   --   --  286  --   --   TROPONINI 0.08* 0.09*  --   --  0.10*  --  0.12*  --   --     Estimated Creatinine Clearance: 52.1 mL/min (A) (by C-G formula based on SCr of 1.36 mg/dL (H)).    Assessment: 5 YOM with significant cardiac history presented from [facility] with a STEMI.  He underwent cardiac catheterization 4/26 and sheath removal was delayed until 4/27 AM due to an elevated INR.  INR rechecked and it was at 1.49, and the sheath was removed at 0900.  Pharmacy consulted to start IV heparin 8 hours post sheath removal and also Coumadin for an LV thrombus found on the ECHO. Patient does not eat/use pork-derived products due to religious reasons, has agreed to use IV heparin  4/28 AM: initial heparin level undetectable  Goal of Therapy:  Heparin level 0.3-0.7 units/ml Monitor platelets by anticoagulation protocol:  Yes   Plan:  -Inc heparin to 1000 units/hr -0900 HL  Narda Bonds 02/23/2017,1:05 AM

## 2017-02-23 NOTE — Progress Notes (Signed)
ANTICOAGULATION CONSULT NOTE - Follow Up Consult  Pharmacy Consult for heparin/coumadin (overlap day #2) Indication: LV thrombus  Allergies  Allergen Reactions  . Pork-Derived Products Other (See Comments)    Does not eat for religious reasons - okay with using IV heparin    Patient Measurements: Height: 5\' 6"  (167.6 cm) Weight: 148 lb 13 oz (67.5 kg) IBW/kg (Calculated) : 63.8 Heparin Dosing Weight: 64 kg  Vital Signs: Temp: 98 F (36.7 C) (04/28 1535) Temp Source: Oral (04/28 1535) BP: 81/58 (04/28 1500) Pulse Rate: 85 (04/28 0800)  Labs:  Recent Labs  02/21/17 1302 02/21/17 1356  02/21/17 1405 02/21/17 1514 02/21/17 1918 02/22/17 0410 02/22/17 0428 02/22/17 1626 02/22/17 2325 02/23/17 0406 02/23/17 0928 02/23/17 1708  HGB 14.3  --   --   --   --   --  13.3  --   --   --  14.1  --   --   HCT 42.6  --   --   --   --   --  40.2  --   --   --  42.6  --   --   PLT 268  --   --   --   --   --  272  --   --   --  253  --   --   APTT  --   --   --  99*  --   --   --   --   --   --   --   --   --   LABPROT  --   --   < > 53.7* 48.4*  --  18.2*  --   --   --  18.6*  --   --   INR  --   --   < > 5.77* 5.17*  --  1.49  --   --   --  1.53  --   --   HEPARINUNFRC  --   --   --   --   --   --   --   --   --  <0.10*  --  0.27* 0.46  CREATININE 1.09 1.06  --   --   --   --  1.12  --  1.36*  --  1.21  --  1.09  CKTOTAL  --   --   --   --   --   --   --  286  --   --   --   --   --   TROPONINI 0.08* 0.09*  --   --   --  0.10*  --  0.12*  --   --   --   --   --   < > = values in this interval not displayed.  Estimated Creatinine Clearance: 65 mL/min (by C-G formula based on SCr of 1.09 mg/dL).   Medications:  Infusions:  . sodium chloride Stopped (02/22/17 1807)  . heparin 1,150 Units/hr (02/23/17 1100)    Assessment: 61 yo M admitted 02/21/2017 from [place] with STEMI. He underwent cardiac cath 4/27 and sheath was removed this AM. Pharmacy consulted to start heparin  while INR subtherapeutic. LV thrombus also noted on ECHO.  Heparin level 0.46 (therapeutic), INR 1.53, Hgb 14.1, Plt 253, no signs/symptoms of bleeding noted.  Goal of Therapy:  Heparin level 0.3-0.7 units/ml  Monitor platelets by anticoagulation protocol: Yes   Plan:  - Continue heparin 1150 units/hr - Monitor daily heparin  level, CBC and signs/symptoms of bleeding - Coumadin previously ordered for tonight's dose of 6 mg x1  Dimitri Ped, PharmD, BCPS PGY-2 Infectious Diseases Pharmacy Resident Pager: [phone] 02/23/2017,5:44 PM

## 2017-02-23 NOTE — Progress Notes (Signed)
CARDIAC REHAB PHASE I   PRE:  Rate/Rhythm: 78 SR  BP:  Supine: 105/83 Sitting:   Standing:    SaO2: 100% RA  MODE:  Ambulation: 350 ft   POST:  Rate/Rhythm: 100 with PVCs, couplets  BP:  Supine:   Sitting: 96/73 recheck 97/84 Standing:    SaO2: 85% RA increased to 99% RA with pursed-lip breathing and rest  0947-1050 Patient tolerated ambulation fair with assist X1 and pushing rolling walker. Oxygen saturation dropped between 85%-87% on room air, multiple rest breaks taken. SaO2 recovered with rest and pursed-lip breathing. Blood pressure low after walk, 96/73 pt c/o feeling lightheaded, water given, informed pt's nurse of low BP and sx's. CHF education and exercise guidelines given including daily weights, low sodium diet, and when to call physician/911. Pt verbalizes understanding of instructions given. Rechecked BP at 97/84. Pt was assisted by RN and NT to bathroom.  Seward Carol, MS, ACSM CEP

## 2017-02-23 NOTE — Progress Notes (Signed)
ANTICOAGULATION CONSULT NOTE - Initial Consult  Pharmacy Consult:  Heparin / Coumadin (Overlap D#2) Indication:  LV thrombus  Allergies  Allergen Reactions  . Pork-Derived Products Other (See Comments)    Does not eat for religious reasons - okay with using IV heparin    Patient Measurements: Height: 5\' 6"  (167.6 cm) Weight: 148 lb 13 oz (67.5 kg) IBW/kg (Calculated) : 63.8 Heparin Dosing Weight: 64 kg  Vital Signs: Temp: 97.8 F (36.6 C) (04/28 0819) Temp Source: Oral (04/28 0819) BP: 100/66 (04/28 0800) Pulse Rate: 85 (04/28 0800)  Labs:  Recent Labs  02/21/17 1302 02/21/17 1356  02/21/17 1405 02/21/17 1514 02/21/17 1918 02/22/17 0410 02/22/17 0428 02/22/17 1626 02/22/17 2325 02/23/17 0406 02/23/17 0928  HGB 14.3  --   --   --   --   --  13.3  --   --   --  14.1  --   HCT 42.6  --   --   --   --   --  40.2  --   --   --  42.6  --   PLT 268  --   --   --   --   --  272  --   --   --  253  --   APTT  --   --   --  99*  --   --   --   --   --   --   --   --   LABPROT  --   --   < > 53.7* 48.4*  --  18.2*  --   --   --  18.6*  --   INR  --   --   < > 5.77* 5.17*  --  1.49  --   --   --  1.53  --   HEPARINUNFRC  --   --   --   --   --   --   --   --   --  <0.10*  --  0.27*  CREATININE 1.09 1.06  --   --   --   --  1.12  --  1.36*  --  1.21  --   CKTOTAL  --   --   --   --   --   --   --  286  --   --   --   --   TROPONINI 0.08* 0.09*  --   --   --  0.10*  --  0.12*  --   --   --   --   < > = values in this interval not displayed.  Estimated Creatinine Clearance: 58.6 mL/min (by C-G formula based on SCr of 1.21 mg/dL).   Medical History: Past Medical History:  Diagnosis Date  . Anxiety   . Arthritis   . Burn   . CAD (coronary artery disease)    a.. NSTEMI s/p BMS to 1st Diagonal and distal OM2 in 2007; b. STEMI 03/26/12 s/p BMS to RCA; c. NSTEMI 10/2012 cath  chronic occlusion of LCx (unable to open with PCI) and PL branch, mod dz of LAD and diagonal, and  preserved LV systolic fxn, Med Rx;  d. 06/2014 Myoview:  EF 30%, large area of reversibility in inflat wall.  . Cardiomyopathy EF 35% on cath 06/30/14, new from jan 2015 07/20/2014  . Chronic arm pain   . Chronic back pain   . Chronic diastolic CHF (congestive heart failure) ([place])  a. 10/2012 Echo: EF 50-55%, mild HK of dist antsept myocardium, Gr 1 DD.  [name] Kitchen DDD (degenerative disc disease), cervical   . Depression   . GERD (gastroesophageal reflux disease)   . HTN (hypertension)   . Hypercholesterolemia   . Mental disorder   . Myocardial infarction ([place])   . Sciatic pain   . Shortness of breath   . Stroke [facility])    a. multiple dating back to 2002.  . Tibia fracture (l) leg  . Tobacco abuse   . Type 2 diabetes mellitus (HCC)       Assessment: 33 YOM with significant cardiac history presented from [facility] with a STEMI.  He underwent cardiac catheterization yesterday and sheath removal was delayed until this AM due to an elevated INR.  INR rechecked and it was at 1.49, and the sheath was removed at 0900.  Pharmacy consulted to start IV heparin 8 hours post sheath removal and also Coumadin for an LV thrombus found on the ECHO.  No bleeding nor hematoma per RN.  Patient does not eat/use pork-derived products due to religious reasons.  Spoke to patient and he agrees to be on IV heparin.  Heparin level this morning just slightly below goal at 0.27.  CBC stable, no overt bleeding or complications noted.  Per RN, no issues with IV infusion.   Goal of Therapy:  Heparin level 0.3-0.7 units/ml Monitor platelets by anticoagulation protocol: Yes    Plan:  - Increase IV heparin to 1150 units/hr. - Check 6 hr heparin level - Repeat Coumadin 6mg  PO today - Daily PT / INR, CBC, heparin level  Uvaldo Rising, BCPS  Clinical Pharmacist Pager [phone]  02/23/2017 11:09 AM

## 2017-02-23 NOTE — Progress Notes (Addendum)
Patient ID: Daniel Li, male   DOB: 1955/11/16, 61 y.o.   MRN: [id number]   SUBJECTIVE: Has headache this morning.  No dyspnea.  Diuresed well yesterday.  No chest pain.  BP low overnight, SBP 100s this morning.  Denies lightheadedness.   Started on heparin gtt and warfarin with LV thrombus.   Echo (4/27) with EF 20%, diffuse hypokinesis, LV thrombus noted, normal RV, moderate MR.   Scheduled Meds: . aspirin EC  81 mg Oral Daily  . carvedilol  3.125 mg Oral BID WC  . clopidogrel  300 mg Oral Once  . [START ON 02/24/2017] clopidogrel  75 mg Oral Daily  . ezetimibe  10 mg Oral Daily  . [START ON 02/24/2017] furosemide  40 mg Oral Daily  . insulin aspart  0-9 Units Subcutaneous TID WC  . losartan  25 mg Oral Daily  . mouth rinse  15 mL Mouth Rinse BID  . pantoprazole (PROTONIX) IV  40 mg Intravenous QHS  . potassium chloride  40 mEq Oral Once  . sodium chloride flush  3 mL Intravenous Q12H  . spironolactone  12.5 mg Oral Daily  . Warfarin - Pharmacist Dosing Inpatient   Does not apply q1800   Continuous Infusions: . sodium chloride Stopped (02/22/17 1807)  . heparin 1,000 Units/hr (02/23/17 0108)   PRN Meds:.sodium chloride, acetaminophen, morphine injection, nitroGLYCERIN, ondansetron (ZOFRAN) IV, sodium chloride flush    Vitals:   02/23/17 0600 02/23/17 0700 02/23/17 0800 02/23/17 0819  BP: 99/69 93/68 100/66   Pulse: 85 65 85   Resp: 18 (!) 22 (!) 21   Temp:    97.8 F (36.6 C)  TempSrc:    Oral  SpO2: 98% 90% 92%   Weight:      Height:        Intake/Output Summary (Last 24 hours) at 02/23/17 0853 Last data filed at 02/23/17 0800  Gross per 24 hour  Intake          1067.06 ml  Output             3960 ml  Net         -2892.94 ml    LABS: Basic Metabolic Panel:  Recent Labs  02/22/17 0428 02/22/17 1626 02/23/17 0406  NA  --  139 139  K  --  4.1 3.4*  CL  --  103 101  CO2  --  27 26  GLUCOSE  --  159* 109*  BUN  --  15 16  CREATININE  --  1.36*  1.21  CALCIUM  --  8.9 8.9  MG 2.1  --  2.0  PHOS  --   --  3.3   Liver Function Tests:  Recent Labs  02/21/17 1302  AST 37  ALT 36  ALKPHOS 71  BILITOT 0.4  PROT 6.3*  ALBUMIN 3.1*    Recent Labs  02/21/17 1356  LIPASE 18   CBC:  Recent Labs  02/22/17 0410 02/23/17 0406  WBC 12.8* 8.5  HGB 13.3 14.1  HCT 40.2 42.6  MCV 79.0 79.3  PLT 272 253   Cardiac Enzymes:  Recent Labs  02/21/17 1356 02/21/17 1918 02/22/17 0428  CKTOTAL  --   --  286  TROPONINI 0.09* 0.10* 0.12*   BNP: Invalid input(s): POCBNP D-Dimer: No results for input(s): DDIMER in the last 72 hours. Hemoglobin A1C:  Recent Labs  02/21/17 1356  HGBA1C 6.7*   Fasting Lipid Panel:  Recent Labs  02/22/17 0410  CHOL 212*  HDL 55  LDLCALC 140*  TRIG 84  CHOLHDL 3.9   Thyroid Function Tests: No results for input(s): TSH, T4TOTAL, T3FREE, THYROIDAB in the last 72 hours.  Invalid input(s): FREET3 Anemia Panel: No results for input(s): VITAMINB12, FOLATE, FERRITIN, TIBC, IRON, RETICCTPCT in the last 72 hours.  RADIOLOGY: Dg Abd 1 View  Result Date: 02/21/2017 CLINICAL DATA:  61 year old with periumbilical abdominal pain and tenderness. Cardiac catheterization with stent placement earlier today. EXAM: Portable ABDOMEN - 1 VIEW COMPARISON:  CT abdomen 04/14/2006. FINDINGS: Mild gaseous distention of several loops of jejunum in the left upper quadrant. Distal small bowel and colon of normal caliber. Large amount of stool in the rectum. Surgical anastomotic suture material in the left mid abdomen. Contrast material within the urinary bladder from the cardiac catheterization earlier today. Degenerative changes throughout the lower thoracic and lumbar spine. Interstitial and airspace pulmonary edema involving the visualized lung bases as described on the portable chest x-ray earlier today. IMPRESSION: Localized ileus in the left upper quadrant versus early partial small bowel obstruction. Ileus  is favored. Electronically Signed   By: Evangeline Dakin M.D.   On: 02/21/2017 16:55   Dg Chest Port 1 View  Result Date: 02/21/2017 CLINICAL DATA:  Respiratory failure with hypoxia. EXAM: PORTABLE CHEST 1 VIEW COMPARISON:  Radiographs of February 21, 2017. FINDINGS: Stable cardiomediastinal silhouette. Stable bilateral pulmonary edema is noted. No pneumothorax or pleural effusion is noted. Bony thorax is unremarkable. IMPRESSION: Stable bilateral pulmonary edema. Electronically Signed   By: Marijo Conception, M.D.   On: 02/21/2017 15:01    PHYSICAL EXAM General: NAD Neck: No JVD, no thyromegaly or thyroid nodule.  Lungs: Clear to auscultation bilaterally with normal respiratory effort. CV: Nondisplaced PMI.  Heart regular S1/S2, no S3/S4, 1/6 HSM apex.  No peripheral edema.   Abdomen: Soft, nontender, no hepatosplenomegaly, no distention.  Neurologic: Alert and oriented x 3.  Psych: Normal affect. Extremities: No clubbing or cyanosis. Right groin cath site benign.   TELEMETRY: Reviewed telemetry pt in NSR  ASSESSMENT AND PLAN: 61 yo with long history of CAD s/p multiple ACS episodes, active smoking, HTN, hyperlipidemia, and DM presented this admission with anterior STEMI and pulmonary edema.  1. CAD: Recent anterior STEMI.  Extensive prior history of CAD.  He has known total occlusion LCx and PLV, DES to totally occluded LAD this admission.  CHF symptoms seemed more prominent and TnI only rose to 0.12, it is possible that his actual event (LAD occlusion) was a number of days ago based on symptom timing.  He did have some collaterals to the LAD system. No further chest pain.   - He will need to be on warfarin for LV thrombus.  Will transition from ticagrelor to clopidogrel today.  He will continue ASA 81 for 1 month then stop.   - No statin given history of rhabdomyolysis.  LDL 153.  Will continue Zetia (not sure if he was taking at home).  Ideally, will need to start PCSK9-inhibitor.  2. Acute on  chronic systolic CHF: Ischemic cardiomyoapthy.  EF 25% on LV-gram, LVEDP 38 on cath 4/26.  Pulmonary edema on admission CXR.  Echo with EF 20%, diffuse hypokinesis, LV thrombus noted, normal RV, moderate MR. He diuresed well yesterday and no longer appears volume overloaded on exam. Low BP overnight, stable now.  - No Lasix today, start 40 mg po daily tomorrow.  - Continue Coreg 3.125 mg bid, spironolactone 12.5 mg daily and losartan 25 mg  daily.  BP too low to titrate or change to [facility].  - With very low EF and recent MI, think he would be a good candidate for Lifevest.  Repeat echo in 3 months to determine ICD candidacy.  3. Diabetes: Per primary service.  4. Smoking: Strongly encouraged to quit.  5. Leg pain/cramps: Bilateral.  Has history of rhabdomyolysis.  CK normal.  Weak pedal pulses, will check peripheral arterial dopplers.  6. Abdominal pain: AXR suggests ileus, less likely partial SBO.  Resolved, started diet.  7. LV thrombus: Started on heparin gtt/warfarin overlap.  Continue heparin until INR > 2.   Mobilize with cardiac rehab, may go to stepdown.   [name] 02/23/2017 8:59 AM

## 2017-02-24 ENCOUNTER — Other Ambulatory Visit: Payer: Self-pay

## 2017-02-24 ENCOUNTER — Inpatient Hospital Stay (HOSPITAL_COMMUNITY): Payer: Medicaid Other

## 2017-02-24 LAB — BASIC METABOLIC PANEL
Anion gap: 5 (ref 5–15)
BUN: 10 mg/dL (ref 6–20)
CHLORIDE: 106 mmol/L (ref 101–111)
CO2: 27 mmol/L (ref 22–32)
Calcium: 8.7 mg/dL — ABNORMAL LOW (ref 8.9–10.3)
Creatinine, Ser: 1.06 mg/dL (ref 0.61–1.24)
GFR calc Af Amer: >60 mL/min (ref >60)
GFR calc non Af Amer: >60 mL/min (ref >60)
GLUCOSE: 120 mg/dL — AB (ref 65–99)
POTASSIUM: 3.8 mmol/L (ref 3.5–5.1)
SODIUM: 138 mmol/L (ref 135–145)

## 2017-02-24 LAB — PROTIME-INR
INR: 1.58
Prothrombin Time: 19 seconds — ABNORMAL HIGH (ref 11.4–15.2)

## 2017-02-24 LAB — GLUCOSE, CAPILLARY
GLUCOSE-CAPILLARY: 80 mg/dL (ref 65–99)
Glucose-Capillary: 134 mg/dL — ABNORMAL HIGH (ref 65–99)
Glucose-Capillary: 212 mg/dL — ABNORMAL HIGH (ref 65–99)
Glucose-Capillary: 120 mg/dL — ABNORMAL HIGH (ref 65–99)

## 2017-02-24 LAB — CBC
HEMATOCRIT: 41.4 % (ref 39.0–52.0)
Hemoglobin: 13.3 g/dL (ref 13.0–17.0)
MCH: 25.6 pg — ABNORMAL LOW (ref 26.0–34.0)
MCHC: 32.1 g/dL (ref 30.0–36.0)
MCV: 79.6 fL (ref 78.0–100.0)
Platelets: 241 10*3/uL (ref 150–400)
RBC: 5.2 MIL/uL (ref 4.22–5.81)
RDW: 14.5 % (ref 11.5–15.5)
WBC: 6.3 10*3/uL (ref 4.0–10.5)

## 2017-02-24 LAB — HEPARIN LEVEL (UNFRACTIONATED): HEPARIN UNFRACTIONATED: 0.56 [IU]/mL (ref 0.30–0.70)

## 2017-02-24 MED ORDER — DIGOXIN 125 MCG PO TABS
0.1250 mg | ORAL_TABLET | Freq: Every day | ORAL | Status: DC
  Administered 2017-02-24 – 2017-02-26 (×3): 0.125 mg via ORAL
  Filled 2017-02-24 (×3): qty 1

## 2017-02-24 MED ORDER — SPIRONOLACTONE 25 MG PO TABS
12.5000 mg | ORAL_TABLET | Freq: Every evening | ORAL | Status: DC
  Administered 2017-02-24 – 2017-02-26 (×3): 12.5 mg via ORAL
  Filled 2017-02-24 (×3): qty 1

## 2017-02-24 MED ORDER — GLIPIZIDE 10 MG PO TABS
10.0000 mg | ORAL_TABLET | Freq: Every day | ORAL | Status: DC
  Administered 2017-02-25 – 2017-02-26 (×2): 10 mg via ORAL
  Filled 2017-02-24 (×4): qty 1

## 2017-02-24 MED ORDER — WARFARIN SODIUM 7.5 MG PO TABS
7.5000 mg | ORAL_TABLET | Freq: Once | ORAL | Status: AC
  Administered 2017-02-24: 7.5 mg via ORAL
  Filled 2017-02-24: qty 1

## 2017-02-24 MED ORDER — LOSARTAN POTASSIUM 25 MG PO TABS
12.5000 mg | ORAL_TABLET | Freq: Every day | ORAL | Status: DC
  Administered 2017-02-24 – 2017-02-26 (×3): 12.5 mg via ORAL
  Filled 2017-02-24 (×3): qty 1

## 2017-02-24 NOTE — Progress Notes (Signed)
ANTICOAGULATION CONSULT NOTE - Follow Up Consult  Pharmacy Consult for heparin/coumadin (overlap day #3) Indication: LV thrombus  Allergies  Allergen Reactions  . Pork-Derived Products Other (See Comments)    Does not eat for religious reasons - okay with using IV heparin    Patient Measurements: Height: 5\' 6"  (167.6 cm) Weight: 138 lb 12.8 oz (63 kg) IBW/kg (Calculated) : 63.8 Heparin Dosing Weight: 64 kg  Vital Signs: Temp: 97.7 F (36.5 C) (04/29 0425) Temp Source: Oral (04/29 0425) BP: 81/63 (04/29 0800) Pulse Rate: 79 (04/29 0800)  Labs:  Recent Labs  02/21/17 1356 02/21/17 1405  02/21/17 1918 02/22/17 0410 02/22/17 0428  02/23/17 0406 02/23/17 0928 02/23/17 1708 02/24/17 0410  HGB  --   --   --   --  13.3  --   --  14.1  --   --  13.3  HCT  --   --   --   --  40.2  --   --  42.6  --   --  41.4  PLT  --   --   --   --  272  --   --  253  --   --  241  APTT  --  99*  --   --   --   --   --   --   --   --   --   LABPROT  --  53.7*  < >  --  18.2*  --   --  18.6*  --   --  19.0*  INR  --  5.77*  < >  --  1.49  --   --  1.53  --   --  1.58  HEPARINUNFRC  --   --   --   --   --   --   < >  --  0.27* 0.46 0.56  CREATININE 1.06  --   --   --  1.12  --   < > 1.21  --  1.09 1.06  CKTOTAL  --   --   --   --   --  286  --   --   --   --   --   TROPONINI 0.09*  --   --  0.10*  --  0.12*  --   --   --   --   --   < > = values in this interval not displayed.  Estimated Creatinine Clearance: 66 mL/min (by C-G formula based on SCr of 1.06 mg/dL).   Medications:  Infusions:  . sodium chloride Stopped (02/22/17 1807)  . heparin 1,150 Units/hr (02/23/17 2115)    Assessment: 61 yo M admitted 02/21/2017 from [place] with STEMI. He underwent cardiac cath 4/27 and sheath was removed this AM. Pharmacy consulted to start heparin while INR subtherapeutic. LV thrombus also noted on ECHO.  Heparin level 0.56 (therapeutic), INR 1.58.  CBC stable. No signs/symptoms of bleeding  noted.  Goal of Therapy:  Heparin level 0.3-0.7 units/ml  Monitor platelets by anticoagulation protocol: Yes   Plan:  - Continue heparin 1150 units/hr - Monitor daily heparin level, CBC and signs/symptoms of bleeding - Coumadin 7.5 mg x 1 tonight. - Daily INR.  Uvaldo Rising, BCPS  Clinical Pharmacist Pager [phone]  02/24/2017 8:04 AM

## 2017-02-24 NOTE — Progress Notes (Addendum)
Patient ID: Daniel Li, male   DOB: [birth year], 61 y.o.   MRN: [id number]   SUBJECTIVE: Doing ok this morning.  Felt some "twisting" in his chest overnight.  No dyspnea at rest or with walk, but was mildly lightheaded when he walked yesterday and BP was low.  Oxygen saturation dropped with walking.  SBP down to 80s at times overnight.   Started on heparin gtt and warfarin with LV thrombus.   ECG with persistent anterior ST elevation.   Echo (4/27) with EF 20%, diffuse hypokinesis, LV apical thrombus noted, normal RV, moderate MR.   Scheduled Meds: . aspirin EC  81 mg Oral Daily  . carvedilol  3.125 mg Oral BID WC  . clopidogrel  75 mg Oral Daily  . digoxin  0.125 mg Oral Daily  . ezetimibe  10 mg Oral Daily  . furosemide  40 mg Oral Daily  . insulin aspart  0-9 Units Subcutaneous TID WC  . losartan  12.5 mg Oral Daily  . mouth rinse  15 mL Mouth Rinse BID  . pantoprazole (PROTONIX) IV  40 mg Intravenous QHS  . sodium chloride flush  3 mL Intravenous Q12H  . spironolactone  12.5 mg Oral QPM  . warfarin  7.5 mg Oral ONCE-1800  . Warfarin - Pharmacist Dosing Inpatient   Does not apply q1800   Continuous Infusions: . sodium chloride Stopped (02/22/17 1807)  . heparin 1,150 Units/hr (02/23/17 2115)   PRN Meds:.sodium chloride, acetaminophen, morphine injection, nitroGLYCERIN, ondansetron (ZOFRAN) IV, sodium chloride flush    Vitals:   02/24/17 0500 02/24/17 0600 02/24/17 0700 02/24/17 0800  BP: (!) 85/70 (!) 83/74 90/72 (!) 81/63  Pulse: 79 78 84 79  Resp: 15 (!) 21 20 17   Temp:      TempSrc:      SpO2: 94% 96% 98% 95%  Weight:      Height:        Intake/Output Summary (Last 24 hours) at 02/24/17 0822 Last data filed at 02/24/17 0800  Gross per 24 hour  Intake           1321.5 ml  Output             1625 ml  Net           -303.5 ml    LABS: Basic Metabolic Panel:  Recent Labs  02/22/17 0428  02/23/17 0406 02/23/17 1708 02/24/17 0410  NA  --   < > 139  138 138  K  --   < > 3.4* 4.1 3.8  CL  --   < > 101 104 106  CO2  --   < > 26 27 27   GLUCOSE  --   < > 109* 109* 120*  BUN  --   < > 16 12 10   CREATININE  --   < > 1.21 1.09 1.06  CALCIUM  --   < > 8.9 8.8* 8.7*  MG 2.1  --  2.0  --   --   PHOS  --   --  3.3  --   --   < > = values in this interval not displayed. Liver Function Tests:  Recent Labs  02/21/17 1302  AST 37  ALT 36  ALKPHOS 71  BILITOT 0.4  PROT 6.3*  ALBUMIN 3.1*    Recent Labs  02/21/17 1356  LIPASE 18   CBC:  Recent Labs  02/23/17 0406 02/24/17 0410  WBC 8.5 6.3  HGB 14.1 13.3  HCT 42.6 41.4  MCV 79.3 79.6  PLT 253 241   Cardiac Enzymes:  Recent Labs  02/21/17 1356 02/21/17 1918 02/22/17 0428  CKTOTAL  --   --  286  TROPONINI 0.09* 0.10* 0.12*   BNP: Invalid input(s): POCBNP D-Dimer: No results for input(s): DDIMER in the last 72 hours. Hemoglobin A1C:  Recent Labs  02/21/17 1356  HGBA1C 6.7*   Fasting Lipid Panel:  Recent Labs  02/22/17 0410  CHOL 212*  HDL 55  LDLCALC 140*  TRIG 84  CHOLHDL 3.9   Thyroid Function Tests: No results for input(s): TSH, T4TOTAL, T3FREE, THYROIDAB in the last 72 hours.  Invalid input(s): FREET3 Anemia Panel: No results for input(s): VITAMINB12, FOLATE, FERRITIN, TIBC, IRON, RETICCTPCT in the last 72 hours.  RADIOLOGY: Dg Abd 1 View  Result Date: 02/21/2017 CLINICAL DATA:  61 year old with periumbilical abdominal pain and tenderness. Cardiac catheterization with stent placement earlier today. EXAM: Portable ABDOMEN - 1 VIEW COMPARISON:  CT abdomen 04/14/2006. FINDINGS: Mild gaseous distention of several loops of jejunum in the left upper quadrant. Distal small bowel and colon of normal caliber. Large amount of stool in the rectum. Surgical anastomotic suture material in the left mid abdomen. Contrast material within the urinary bladder from the cardiac catheterization earlier today. Degenerative changes throughout the lower thoracic and  lumbar spine. Interstitial and airspace pulmonary edema involving the visualized lung bases as described on the portable chest x-ray earlier today. IMPRESSION: Localized ileus in the left upper quadrant versus early partial small bowel obstruction. Ileus is favored. Electronically Signed   By: Evangeline Dakin M.D.   On: 02/21/2017 16:55   Dg Chest Port 1 View  Result Date: 02/21/2017 CLINICAL DATA:  Respiratory failure with hypoxia. EXAM: PORTABLE CHEST 1 VIEW COMPARISON:  Radiographs of February 21, 2017. FINDINGS: Stable cardiomediastinal silhouette. Stable bilateral pulmonary edema is noted. No pneumothorax or pleural effusion is noted. Bony thorax is unremarkable. IMPRESSION: Stable bilateral pulmonary edema. Electronically Signed   By: Marijo Conception, M.D.   On: 02/21/2017 15:01    PHYSICAL EXAM General: NAD Neck: JVP 7 cm, no thyromegaly or thyroid nodule.  Lungs: CTAB, somewhat distant BS.  CV: Nondisplaced PMI.  Heart regular S1/S2, no S3/S4, 1/6 HSM apex.  No peripheral edema.   Abdomen: Soft, nontender, no hepatosplenomegaly, no distention.  Neurologic: Alert and oriented x 3.  Psych: Normal affect. Extremities: No clubbing or cyanosis. Right groin cath site benign.   TELEMETRY: Personally reviewed telemetry pt in NSR with occasional PVCs  ASSESSMENT AND PLAN: 61 yo with long history of CAD s/p multiple ACS episodes, active smoking, HTN, hyperlipidemia, and DM presented this admission with anterior STEMI and pulmonary edema.  1. CAD: Recent anterior STEMI.  Extensive prior history of CAD.  He has known total occlusion LCx and PLV, DES to totally occluded LAD this admission.  CHF symptoms seemed more prominent and TnI only rose to 0.12, it is possible that his actual event (LAD occlusion) was a number of days prior to admission based on symptom timing.  He did have some collaterals to the LAD system. No further ischemic-type chest pain.   - He will need to be on warfarin for LV  thrombus.  Continue Plavix.  He will continue ASA 81 for 1 month then stop.   - No statin given history of rhabdomyolysis.  LDL 153.  Will continue Zetia (not sure if he was taking at home).  Ideally, will need to start PCSK9-inhibitor.  2. Acute  on chronic systolic CHF: Ischemic cardiomyoapthy.  EF 25% on LV-gram, LVEDP 38 on cath 4/26.  Pulmonary edema on admission CXR.  Echo with EF 20%, diffuse hypokinesis, LV thrombus noted, normal RV, moderate MR. Persistent ST elevation anteriorly reflective of apical akinesis/aneurysm.  He diuresed well initially, weight down 10 lbs, and no longer appears volume overloaded on exam. Low BP overnight, some lightheadedness with walking.  - Resume home Lasix 40 mg daily.   - Add digoxin.  - With drop in oxygen saturation with walking, will get CXR to make sure lungs are cleared.  May be due to COPD given long smoking history.  - Continue low dose Coreg, decrease losartan to 12.5 mg daily, move spironolactone to the evening.   - With very low EF and recent MI, think he would be a good candidate for Lifevest.  Repeat echo in 3 months to determine ICD candidacy.  3. Diabetes: Can resume home glipizide. May benefit from empagliflozin transition but will need to see if insurance would cover.  4. Smoking: Strongly encouraged to quit.  5. Leg pain/cramps: Bilateral.  Has history of rhabdomyolysis.  CK normal.  Weak pedal pulses, will check peripheral arterial dopplers (still pending).  6. Abdominal pain: Ileus, now resolved.  7. LV thrombus: Started on heparin gtt/warfarin overlap.  Continue heparin until INR > 2.   Mobilize with cardiac rehab, may go to stepdown.   [name] 02/24/2017 8:22 AM

## 2017-02-25 ENCOUNTER — Inpatient Hospital Stay (HOSPITAL_COMMUNITY): Payer: Medicaid Other

## 2017-02-25 DIAGNOSIS — I70219 Atherosclerosis of native arteries of extremities with intermittent claudication, unspecified extremity: Secondary | ICD-10-CM

## 2017-02-25 LAB — GLUCOSE, CAPILLARY
GLUCOSE-CAPILLARY: 168 mg/dL — AB (ref 65–99)
GLUCOSE-CAPILLARY: 115 mg/dL — AB (ref 65–99)
Glucose-Capillary: 130 mg/dL — ABNORMAL HIGH (ref 65–99)
Glucose-Capillary: 88 mg/dL (ref 65–99)
Glucose-Capillary: 84 mg/dL (ref 65–99)

## 2017-02-25 LAB — BASIC METABOLIC PANEL
Anion gap: 6 (ref 5–15)
BUN: 13 mg/dL (ref 6–20)
CALCIUM: 8.9 mg/dL (ref 8.9–10.3)
CO2: 28 mmol/L (ref 22–32)
CREATININE: 1.1 mg/dL (ref 0.61–1.24)
Chloride: 105 mmol/L (ref 101–111)
GFR calc Af Amer: >60 mL/min (ref >60)
GFR calc non Af Amer: >60 mL/min (ref >60)
GLUCOSE: 125 mg/dL — AB (ref 65–99)
Potassium: 3.9 mmol/L (ref 3.5–5.1)
Sodium: 139 mmol/L (ref 135–145)

## 2017-02-25 LAB — CBC
HCT: 38.4 % — ABNORMAL LOW (ref 39.0–52.0)
Hemoglobin: 12.6 g/dL — ABNORMAL LOW (ref 13.0–17.0)
MCH: 26.4 pg (ref 26.0–34.0)
MCHC: 32.8 g/dL (ref 30.0–36.0)
MCV: 80.3 fL (ref 78.0–100.0)
PLATELETS: 218 10*3/uL (ref 150–400)
RBC: 4.78 MIL/uL (ref 4.22–5.81)
RDW: 14.8 % (ref 11.5–15.5)
WBC: 4.8 10*3/uL (ref 4.0–10.5)

## 2017-02-25 LAB — PROTIME-INR
INR: 1.7
Prothrombin Time: 20.2 seconds — ABNORMAL HIGH (ref 11.4–15.2)

## 2017-02-25 LAB — HEPARIN LEVEL (UNFRACTIONATED): HEPARIN UNFRACTIONATED: 0.7 [IU]/mL (ref 0.30–0.70)

## 2017-02-25 MED ORDER — WARFARIN SODIUM 7.5 MG PO TABS
7.5000 mg | ORAL_TABLET | Freq: Once | ORAL | Status: AC
  Administered 2017-02-25: 7.5 mg via ORAL
  Filled 2017-02-25: qty 1

## 2017-02-25 NOTE — Social Work (Addendum)
CSW received consult from [facility] as patient sister, Daniel Li concerned for him at home. CSW spoke with sister and she indicated that he has many social concerns and has court case and she cannot care for him. CSW validated and discussed her issues. CSW encouraged her to f/u with local social service and APS dept for community resources for patient if she see fit. CSW discussed that he is still being medically worked up and does not know a DC plan.  She indicated that she cannot pick him up from hospital when it is time for DC to home and wld we assist. CSW indicated that at that time we would explore options for him for transportation and let her know if we can assist him with getting home. She is worried about his health as pt does not appear to be in the best health. CSW unsure if social service can assist but did provide her that phone number in the community to f/u. Pt has medicaid and could possibly get assisted with housing through county they reside in if sister not feeling she can care for him at her home due to her health.  Sister indicated that she will f/u with social service for more assistance.

## 2017-02-25 NOTE — Progress Notes (Signed)
CARDIAC REHAB PHASE I   PRE:  Rate/Rhythm: 87 SR  BP:  Supine: 97/73  Sitting:   Standing:    SaO2: 98%RA  MODE:  Ambulation: 450 ft   POST:  Rate/Rhythm: 91 SR  BP:  Supine:   Sitting: 98/67  Standing:    SaO2: 99%RA 1020-1100 Pt walked 450 ft on RA with rolling walker and asst x 1 with fairly steady gait. LOB once when he lifted his foot. To recliner after walk. Did not need to stop and rest. Sats good on RA after walk. Encouraged pt to watch his sodium in diet. Offered to show him lifevest video. Will watch later he stated. Call bell in reach. Tired by end of walk.   Graylon Good, RN BSN  02/25/2017 10:56 AM

## 2017-02-25 NOTE — Progress Notes (Signed)
Received phone call from Alphonzo Lemmings, patient's sister and per Mardene Celeste she is Mr. Senkbeil's POA.   Mardene Celeste requested case management give her a call at [phone] to discuss patient's discharge plan as Mardene Celeste doesn't feel it is safe for patient to be discharged home and would like to discuss facility placement.  Called case manager [name], RN, BSN [phone] and left voicemail informing her of request.

## 2017-02-25 NOTE — Progress Notes (Signed)
ANTICOAGULATION CONSULT NOTE - Follow Up Consult  Pharmacy Consult for heparin/coumadin (overlap day #4) Indication: LV thrombus  Allergies  Allergen Reactions  . Pork-Derived Products Other (See Comments)    Does not eat for religious reasons - okay with using IV heparin    Patient Measurements: Height: 5\' 6"  (167.6 cm) Weight: 137 lb 12.6 oz (62.5 kg) IBW/kg (Calculated) : 63.8 Heparin Dosing Weight: 64 kg  Vital Signs: Temp: 98.8 F (37.1 C) (04/30 0407) Temp Source: Oral (04/30 0407) BP: 92/73 (04/30 0407) Pulse Rate: 76 (04/30 0553)  Labs:  Recent Labs  02/23/17 0406  02/23/17 1708 02/24/17 0410 02/25/17 0305  HGB 14.1  --   --  13.3 12.6*  HCT 42.6  --   --  41.4 38.4*  PLT 253  --   --  241 218  LABPROT 18.6*  --   --  19.0* 20.2*  INR 1.53  --   --  1.58 1.70  HEPARINUNFRC  --   < > 0.46 0.56 0.70  CREATININE 1.21  --  1.09 1.06 1.10  < > = values in this interval not displayed.  Estimated Creatinine Clearance: 63.1 mL/min (by C-G formula based on SCr of 1.1 mg/dL).   Medications:  Infusions:  . sodium chloride Stopped (02/22/17 1807)  . heparin 1,150 Units/hr (02/25/17 0400)    Assessment: 61 yo M admitted 02/21/2017 from [facility] with STEMI. He underwent cardiac cath 4/27 and sheath was removed this AM. Pharmacy consulted to start heparin while INR subtherapeutic. LV thrombus also noted on ECHO.  Heparin level 0.7 (therapeutic) but trending up this morning, INR also trending up to 1.7. CBC with slight downward trend. No signs/symptoms of bleeding noted.  Goal of Therapy:  INR goal 2-3 Heparin level 0.3-0.7 units/ml  Monitor platelets by anticoagulation protocol: Yes   Plan:  - Decrease heparin 1050 units/hr - Monitor daily heparin level, CBC and signs/symptoms of bleeding - Repeat Coumadin 7.5 mg x 1 tonight. - Daily INR.  Erin Hearing PharmD., BCPS Clinical Pharmacist Pager [phone] 02/25/2017 7:25 AM

## 2017-02-25 NOTE — Progress Notes (Signed)
Patient ID: Daniel Li, male   DOB: 1956/03/27, 61 y.o.   MRN: [id number]   SUBJECTIVE:  Denies SOB/CP.   Echo (4/27) with EF 20%, diffuse hypokinesis, LV apical thrombus noted, normal RV, moderate MR.   Scheduled Meds: . aspirin EC  81 mg Oral Daily  . carvedilol  3.125 mg Oral BID WC  . clopidogrel  75 mg Oral Daily  . digoxin  0.125 mg Oral Daily  . ezetimibe  10 mg Oral Daily  . furosemide  40 mg Oral Daily  . glipiZIDE  10 mg Oral QAC breakfast  . insulin aspart  0-9 Units Subcutaneous TID WC  . losartan  12.5 mg Oral Daily  . mouth rinse  15 mL Mouth Rinse BID  . pantoprazole (PROTONIX) IV  40 mg Intravenous QHS  . sodium chloride flush  3 mL Intravenous Q12H  . spironolactone  12.5 mg Oral QPM  . warfarin  7.5 mg Oral ONCE-1800  . Warfarin - Pharmacist Dosing Inpatient   Does not apply q1800   Continuous Infusions: . sodium chloride Stopped (02/22/17 1807)  . heparin 1,050 Units/hr (02/25/17 0823)   PRN Meds:.sodium chloride, acetaminophen, morphine injection, nitroGLYCERIN, ondansetron (ZOFRAN) IV, sodium chloride flush    Vitals:   02/25/17 0000 02/25/17 0407 02/25/17 0553 02/25/17 0817  BP:  92/73  (!) 89/78  Pulse: 72 84 76 86  Resp:  19    Temp:  98.8 F (37.1 C)  98.5 F (36.9 C)  TempSrc:  Oral  Oral  SpO2: 97% 96% 98% 97%  Weight:   137 lb 12.6 oz (62.5 kg)   Height:        Intake/Output Summary (Last 24 hours) at 02/25/17 0845 Last data filed at 02/25/17 0817  Gross per 24 hour  Intake              830 ml  Output             1600 ml  Net             -770 ml    LABS: Basic Metabolic Panel:  Recent Labs  02/23/17 0406  02/24/17 0410 02/25/17 0305  NA 139  < > 138 139  K 3.4*  < > 3.8 3.9  CL 101  < > 106 105  CO2 26  < > 27 28  GLUCOSE 109*  < > 120* 125*  BUN 16  < > 10 13  CREATININE 1.21  < > 1.06 1.10  CALCIUM 8.9  < > 8.7* 8.9  MG 2.0  --   --   --   PHOS 3.3  --   --   --   < > = values in this interval not  displayed. Liver Function Tests: No results for input(s): AST, ALT, ALKPHOS, BILITOT, PROT, ALBUMIN in the last 72 hours. No results for input(s): LIPASE, AMYLASE in the last 72 hours. CBC:  Recent Labs  02/24/17 0410 02/25/17 0305  WBC 6.3 4.8  HGB 13.3 12.6*  HCT 41.4 38.4*  MCV 79.6 80.3  PLT 241 218   Cardiac Enzymes: No results for input(s): CKTOTAL, CKMB, CKMBINDEX, TROPONINI in the last 72 hours. BNP: Invalid input(s): POCBNP D-Dimer: No results for input(s): DDIMER in the last 72 hours. Hemoglobin A1C: No results for input(s): HGBA1C in the last 72 hours. Fasting Lipid Panel: No results for input(s): CHOL, HDL, LDLCALC, TRIG, CHOLHDL, LDLDIRECT in the last 72 hours. Thyroid Function Tests: No results for input(s): TSH,  T4TOTAL, T3FREE, THYROIDAB in the last 72 hours.  Invalid input(s): FREET3 Anemia Panel: No results for input(s): VITAMINB12, FOLATE, FERRITIN, TIBC, IRON, RETICCTPCT in the last 72 hours.  RADIOLOGY: Dg Abd 1 View  Result Date: 02/21/2017 CLINICAL DATA:  61 year old with periumbilical abdominal pain and tenderness. Cardiac catheterization with stent placement earlier today. EXAM: Portable ABDOMEN - 1 VIEW COMPARISON:  CT abdomen 04/14/2006. FINDINGS: Mild gaseous distention of several loops of jejunum in the left upper quadrant. Distal small bowel and colon of normal caliber. Large amount of stool in the rectum. Surgical anastomotic suture material in the left mid abdomen. Contrast material within the urinary bladder from the cardiac catheterization earlier today. Degenerative changes throughout the lower thoracic and lumbar spine. Interstitial and airspace pulmonary edema involving the visualized lung bases as described on the portable chest x-ray earlier today. IMPRESSION: Localized ileus in the left upper quadrant versus early partial small bowel obstruction. Ileus is favored. Electronically Signed   By: Evangeline Dakin M.D.   On: 02/21/2017 16:55    Dg Chest Port 1 View  Result Date: 02/24/2017 CLINICAL DATA:  CHF EXAM: PORTABLE CHEST 1 VIEW COMPARISON:  02/21/2017 FINDINGS: There is a small left pleural effusion. There is near complete resolution of bilateral perihilar interstitial and alveolar airspace opacities. There is no focal parenchymal opacity. There is no pneumothorax. Stable cardiomediastinal silhouette. The osseous structures are unremarkable. IMPRESSION: Interval resolution of CHF.  Small persistent left pleural effusion. Electronically Signed   By: Kathreen Devoid   On: 02/24/2017 09:38   Dg Chest Port 1 View  Result Date: 02/21/2017 CLINICAL DATA:  Respiratory failure with hypoxia. EXAM: PORTABLE CHEST 1 VIEW COMPARISON:  Radiographs of February 21, 2017. FINDINGS: Stable cardiomediastinal silhouette. Stable bilateral pulmonary edema is noted. No pneumothorax or pleural effusion is noted. Bony thorax is unremarkable. IMPRESSION: Stable bilateral pulmonary edema. Electronically Signed   By: Marijo Conception, M.D.   On: 02/21/2017 15:01    PHYSICAL EXAM General:  Well appearing. No resp difficulty. Sitting on the side of the bed.  HEENT: normal Neck: supple. no JVD. Carotids 2+ bilat; no bruits. No lymphadenopathy or thryomegaly appreciated. Cor: PMI nondisplaced. Regular rate & rhythm. No rubs, gallops or murmurs. Lungs: Decreased in the bases. On room air.   Abdomen: soft, nontender, nondistended. No hepatosplenomegaly. No bruits or masses. Good bowel sounds. Extremities: no cyanosis, clubbing, rash, edema Neuro: alert & orientedx3, cranial nerves grossly intact. moves all 4 extremities w/o difficulty. Affect pleasant  TELEMETRY: NSR 70s personally reviewed.   ASSESSMENT AND PLAN: 61 yo with long history of CAD s/p multiple ACS episodes, active smoking, HTN, hyperlipidemia, and DM presented this admission with anterior STEMI and pulmonary edema.  1. CAD: Recent anterior STEMI.  Extensive prior history of CAD.  He has known  total occlusion LCx and PLV, DES to totally occluded LAD this admission.  CHF symptoms seemed more prominent and TnI only rose to 0.12, it is possible that his actual event (LAD occlusion) was a number of days prior to admission based on symptom timing.  He did have some collaterals to the LAD system. No further ischemic-type chest pain.   - Continue coumadin with LV thrombus - Continue Plavix.  -  He will continue ASA 81 for 1 month then stop (03/26/2017)   - No statin given history of rhabdomyolysis.  LDL 153.  Will continue Zetia (not sure if he was taking at home).  Ideally, will need to start PCSK9-inhibitor.  2. Acute  on chronic systolic CHF: Ischemic cardiomyoapthy.  EF 25% on LV-gram, LVEDP 38 on cath 4/26.  Pulmonary edema on admission CXR.  Echo with EF 20%, diffuse hypokinesis, LV thrombus noted, normal RV, moderate MR. 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  Volume status stable.  - Continue po lasix.  - SBP soft will not titrate meds. Continue low dose Coreg, losartan 12.5 mg daily, and spiro at the current dose.  -  Repeat echo in 3 months to determine ICD candidacy. - Will need Lifevest prior to d/c   3. Diabetes: Can resume home glipizide. May benefit from empagliflozin transition but will need to see if insurance would cover.  4. Smoking: Strongly encouraged to quit.  5. Leg pain/cramps: Bilateral.  Has history of rhabdomyolysis.  CK normal.   Vascular US ordered.   6. Abdominal pain: Ileus, now resolved.  7. LV thrombus: Continue heparin + coumadin. INR 1.7.  Continue heparin until INR > 2. Pharmacy dosing.   Consult PT. ? HH versus SNF?  Lives alone.   Completed form for Life Vest.   Amy Clegg NP-C  02/25/2017 8:45 AM   Patient seen with NP, agree with the above note.  Walking today with cardiac rehab, but remains weak.  SBP in 90s generally, no room to titrate meds.  Not lightheaded with his afternoon walk.   Awaiting therapeutic INR, on  heparin gtt/warfarin.  Will have PT see him to see if they recommend SNF versus home with PT.   [name] 02/25/2017

## 2017-02-25 NOTE — Care Management Note (Addendum)
Original Note Created by; Case Management Note Daniel Gibbons RN, BSN Unit 2W-Case Manager [phone]  Patient Details  Name: Daniel Li MRN: [id number] Date of Birth: 1955/11/25  Subjective/Objective:    Pt admitted from [facility] with Stemi, s/p emergent cath- pt has been placed on Brilinta                Action/Plan: PTA pt lived at home- referral for medication needs received- pt has Medicaid- cost per med $3.70- Brilinta is covered under Medicaid- attempted to see pt at bedside to discuss d/c needs- pt resting (4/27 am)- CM will see pt prior to discharge and provide 30 day free card for Brilinta.   Expected Discharge Date:                  Expected Discharge Plan:  [facility] [place]  In-House Referral:     Discharge planning Services  CM Consult, Medication Assistance  Post Acute Care Choice:    Choice offered to:     DME Arranged:    DME Agency:     HH Arranged:    HH Agency:     Status of Service:  In process, will continue to follow  If discussed at Long Length of Stay Meetings, dates discussed:    Discharge Disposition:   Additional Comments: 02/25/2017  CM contacted sister POA Daniel Li regarding concern voiced to bedside nurse due to safety concerns with pt returning home.  Per sister ; pt drinks excessively, is non compliant with medications and allows unsafe people in the home to stay with him.  Sister requested placement in SNF - CM explained the process with PT eval and CSW (CSW consulted and will contact sister directly).   CM also explained that [facility] for medication management could be arranged if discharged home.  PT eval not yet completed; however pt has been seen by Cardiac Rehab  Pt has been approved for Life Vest, pt will be  fitted tonight or tomorrow.  CM informed attending that Attending Daniel Li aware of referral.  CM faxed all required documentation to [place] team.  Per NP - pt will likely discharge   tomorrow  (PT ordered to determine safe disposition)- [facility] aware.  Pt is independent from home.    To be covered under medicaid; [employer] requires trial and failure or insufficient response to metformin containing products unless contraindicated or documented adverse event when using either a preferred or a non-prefrerred SGLT2 Inhibitor and Combination.  CM contacted Life Vest rep regarding referral for pt.  CM will place Life Vest application on shadow chart or attending to sign (attending made aware) Daniel Quinones, RN, BSN [phone]   02/23/17- 20- Daniel Gibbons RN, CM- spoke with pt at bedside- per pt he lives alone- has a nephew that helps him and a sister. He indicates that he does not have difficulty getting his meds with Medicaid and uses [facility]. Per conversation with bedside RN- Daniel Li- plan now is not to d/c on Brilinta and pt will be on ASA/plavix/coumadin due to thrombus that was found. Pt may benefit from [facility] at discharge for medication management- would not qualify for HHPT/OT as he does not have a qualifying dx under medicaid. CM will continue to follow for d/c needs

## 2017-02-25 NOTE — Progress Notes (Signed)
VASCULAR LAB PRELIMINARY  ARTERIAL  ABI completed: Right ABI of 0.84 is suggestive of mild arterial occlusive disease at rest. Left ABI of 1.27 is suggestive of arterial flow within normal limits at rest.    RIGHT    LEFT    PRESSURE WAVEFORM  PRESSURE WAVEFORM  BRACHIAL 93 Triphasic BRACHIAL 92 Triphasic  DP 62 Monophasic DP 110 Monophasic  PT 78 Monophasic PT 118 Triphasic    RIGHT LEFT  ABI 0.84 1.27     [name], RVT 02/25/2017, 10:20 AM

## 2017-02-26 ENCOUNTER — Other Ambulatory Visit (HOSPITAL_COMMUNITY): Payer: Self-pay | Admitting: Cardiology

## 2017-02-26 DIAGNOSIS — I2129 ST elevation (STEMI) myocardial infarction involving other sites: Secondary | ICD-10-CM

## 2017-02-26 DIAGNOSIS — I236 Thrombosis of atrium, auricular appendage, and ventricle as current complications following acute myocardial infarction: Secondary | ICD-10-CM | POA: Insufficient documentation

## 2017-02-26 LAB — BASIC METABOLIC PANEL
ANION GAP: 6 (ref 5–15)
BUN: 13 mg/dL (ref 6–20)
CALCIUM: 8.8 mg/dL — AB (ref 8.9–10.3)
CO2: 27 mmol/L (ref 22–32)
Chloride: 106 mmol/L (ref 101–111)
Creatinine, Ser: 1.11 mg/dL (ref 0.61–1.24)
Glucose, Bld: 119 mg/dL — ABNORMAL HIGH (ref 65–99)
Potassium: 3.8 mmol/L (ref 3.5–5.1)
SODIUM: 139 mmol/L (ref 135–145)

## 2017-02-26 LAB — GLUCOSE, CAPILLARY
GLUCOSE-CAPILLARY: 135 mg/dL — AB (ref 65–99)
GLUCOSE-CAPILLARY: 107 mg/dL — AB (ref 65–99)
Glucose-Capillary: 108 mg/dL — ABNORMAL HIGH (ref 65–99)

## 2017-02-26 LAB — CBC
HCT: 40.4 % (ref 39.0–52.0)
HEMOGLOBIN: 13 g/dL (ref 13.0–17.0)
MCH: 25.8 pg — AB (ref 26.0–34.0)
MCHC: 32.2 g/dL (ref 30.0–36.0)
MCV: 80.3 fL (ref 78.0–100.0)
PLATELETS: 226 10*3/uL (ref 150–400)
RBC: 5.03 MIL/uL (ref 4.22–5.81)
RDW: 14.6 % (ref 11.5–15.5)
WBC: 5.2 10*3/uL (ref 4.0–10.5)

## 2017-02-26 LAB — PROTIME-INR
INR: 2
PROTHROMBIN TIME: 23 s — AB (ref 11.4–15.2)

## 2017-02-26 LAB — HEPARIN LEVEL (UNFRACTIONATED): Heparin Unfractionated: 0.45 IU/mL (ref 0.30–0.70)

## 2017-02-26 MED ORDER — DIGOXIN 125 MCG PO TABS: 0.1250 mg | ORAL_TABLET | Freq: Every day | ORAL | 3 refills | Status: DC

## 2017-02-26 MED ORDER — NITROGLYCERIN 0.4 MG SL SUBL: 0.4000 mg | SUBLINGUAL_TABLET | SUBLINGUAL | 3 refills | Status: DC | PRN

## 2017-02-26 MED ORDER — FUROSEMIDE 40 MG PO TABS: 40.0000 mg | ORAL_TABLET | Freq: Every day | ORAL | 3 refills | Status: DC

## 2017-02-26 MED ORDER — CARVEDILOL 3.125 MG PO TABS: 3.1250 mg | ORAL_TABLET | Freq: Two times a day (BID) | ORAL | 3 refills | Status: DC

## 2017-02-26 MED ORDER — CLOPIDOGREL BISULFATE 75 MG PO TABS: 75.0000 mg | ORAL_TABLET | Freq: Every day | ORAL | 3 refills | Status: DC

## 2017-02-26 MED ORDER — SPIRONOLACTONE 25 MG PO TABS: 12.5000 mg | ORAL_TABLET | Freq: Every evening | ORAL | 3 refills | Status: DC

## 2017-02-26 MED ORDER — WARFARIN SODIUM 7.5 MG PO TABS
7.5000 mg | ORAL_TABLET | ORAL | Status: AC
  Administered 2017-02-26: 7.5 mg via ORAL
  Filled 2017-02-26: qty 1

## 2017-02-26 MED ORDER — WARFARIN SODIUM 5 MG PO TABS: 7.5000 mg | ORAL_TABLET | Freq: Every day | ORAL | 3 refills | Status: DC

## 2017-02-26 MED ORDER — LOSARTAN POTASSIUM 25 MG PO TABS: 12.5000 mg | ORAL_TABLET | Freq: Every day | ORAL | 3 refills | Status: DC

## 2017-02-26 NOTE — Telephone Encounter (Signed)
Patient scheduled for discharge 02/26/17, will need a coumadin follow up 5/3 or 5/4 per Rebecca Eaton  Order placed and will call for appointment

## 2017-02-26 NOTE — Care Management Note (Signed)
Original Note Created by; Case Management Note Daniel Gibbons RN, BSN Unit 2W-Case Manager [phone]  Patient Details  Name: Daniel Li MRN: [id number] Date of Birth: [birth year]  Subjective/Objective:    Pt admitted from [facility] with [facility], s/p emergent cath- pt has been placed on Brilinta                Action/Plan: PTA pt lived at home- referral for medication needs received- pt has Medicaid- cost per med $3.70- Brilinta is covered under Medicaid- attempted to see pt at bedside to discuss d/c needs- pt resting (4/27 am)- CM will see pt prior to discharge and provide 30 day free card for Brilinta.   Expected Discharge Date:                  Expected Discharge Plan:  [place]  In-House Referral:     Discharge planning Services  CM Consult, Medication Assistance  Post Acute Care Choice:    Choice offered to:     DME Arranged:    DME Agency:     HH Arranged:    HH Agency:     Status of Service:  In process, will continue to follow  If discussed at Long Length of Stay Meetings, dates discussed:    Discharge Disposition:   Additional Comments: 02/26/2017  CSW followed up with POA - provided DSS of [facility] phone number.  CM spoke with [facility] - pt has been deemed safe to discharge home with out therapy.  CM will request [facility] for medication management.  CSW will arrange transport from hospital to home.  Pt states he has the information for [facility] - but hasn't contacted them as of yet.  Pt denied concerns with returning to home environment without out sisters help - pt states "I can take care of myself".      02/25/17 CM contacted sister POA Daniel Li regarding concern voiced to bedside nurse due to safety concerns with pt returning home.  Per sister ; pt drinks excessively, is non compliant with medications and allows unsafe people in the home to stay with him.  Sister requested placement in SNF - CM explained the process with PT eval and  CSW (CSW consulted and will contact sister directly).   CM also explained that [facility] for medication management could be arranged if discharged home.  PT eval not yet completed; however pt has been seen by Cardiac Rehab  Pt has been approved for Life Vest, pt will be  fitted tonight or tomorrow.  CM informed attending that Attending [place] aware of referral.  CM faxed all required documentation to [place] team.  Per NP - pt will likely discharge  tomorrow  (PT ordered to determine safe disposition)- [place] aware.  Pt is independent from home.    To be covered under medicaid; Jardiance Tablet requires trial and failure or insufficient response to metformin containing products unless contraindicated or documented adverse event when using either a preferred or a non-prefrerred SGLT2 Inhibitor and Combination.  CM contacted Life Vest rep regarding referral for pt.  CM will place Life Vest application on shadow chart or attending to sign (attending made aware) Daniel Quinones, RN, BSN [phone]   02/23/17- 84- Daniel Gibbons RN, CM- spoke with pt at bedside- per pt he lives alone- has a nephew that helps him and a sister. He indicates that he does not have difficulty getting his meds with Medicaid and uses [facility]. Per conversation  with bedside RN- Daniel Li- plan now is not to d/c on Brilinta and pt will be on ASA/plavix/coumadin due to thrombus that was found. Pt may benefit from [facility] at discharge for medication management- would not qualify for HHPT/OT as he does not have a qualifying dx under medicaid. CM will continue to follow for d/c needs

## 2017-02-26 NOTE — Care Management Note (Addendum)
Original Note Created by; Case Management Note Daniel Gibbons RN, BSN Unit 2W-Case Manager [phone]  Patient Details  Name: Daniel Li MRN: [id number] Date of Birth: 05/25/1956  Subjective/Objective:    Pt admitted from [facility] with Stemi, s/p emergent cath- pt has been placed on Brilinta                Action/Plan: PTA pt lived at home- referral for medication needs received- pt has Medicaid- cost per med $3.70- Brilinta is covered under Medicaid- attempted to see pt at bedside to discuss d/c needs- pt resting (4/27 am)- CM will see pt prior to discharge and provide 30 day free card for Brilinta.   Expected Discharge Date:  02/26/17               Expected Discharge Plan:  [place]  In-House Referral:     Discharge planning Services  CM Consult, Medication Assistance  Post Acute Care Choice:    Choice offered to:  Patient  DME Arranged:  3-N-1 DME Agency:  [place]:  RN [facility] Agency:  [place]  Status of Service:  In process, will continue to follow  If discussed at Long Length of Stay Meetings, dates discussed:    Discharge Disposition:   Additional Comments: 02/26/2017  Pt declined HHPT, Aide .  CM offered pt choice for HH/DME - pt chose [facility] - agency contacted and referral accepted - agency informed pt will discharge home today  CSW followed up with POA - provided DSS of [facility] phone number.  CM spoke with [facility] - pt has been deemed safe to discharge home with out therapy.  CM will request [facility] for medication management.  CSW will arrange transport from hospital to home.  Pt states he has the information for [facility] - but hasn't contacted them as of yet.  Pt denied concerns with returning to home environment without out sisters help - pt states "I can take care of myself".   Pt states he has walker and cane in the home  02/25/17 CM contacted sister POA Daniel Li regarding concern voiced  to bedside nurse due to safety concerns with pt returning home.  Per sister ; pt drinks excessively, is non compliant with medications and allows unsafe people in the home to stay with him.  Sister requested placement in SNF - CM explained the process with PT eval and CSW (CSW consulted and will contact sister directly).   CM also explained that [facility] for medication management could be arranged if discharged home.  PT eval not yet completed; however pt has been seen by Cardiac Rehab  Pt has been approved for Life Vest, pt will be  fitted tonight or tomorrow.  CM informed attending that Attending [place] aware of referral.  CM faxed all required documentation to [place] team.  Per NP - pt will likely discharge  tomorrow  (PT ordered to determine safe disposition)- [place] aware.  Pt is independent from home.    To be covered under medicaid; Jardiance Tablet requires trial and failure or insufficient response to metformin containing products unless contraindicated or documented adverse event when using either a preferred or a non-prefrerred SGLT2 Inhibitor and Combination - Information provided to attending via direct text and physician sticky tab.  CM contacted Life Vest rep regarding referral for pt.  CM will place Life Vest application on shadow chart or attending to sign (attending made aware)  [name], RN, BSN [phone]   02/23/17- 45- Daniel Gibbons RN, CM- spoke with pt at bedside- per pt he lives alone- has a nephew that helps him and a sister. He indicates that he does not have difficulty getting his meds with Medicaid and uses [place]. Per conversation with bedside RN- Daniel Li- plan now is not to d/c on Brilinta and pt will be on ASA/plavix/coumadin due to thrombus that was found. Pt may benefit from [facility] at discharge for medication management- would not qualify for HHPT/OT as he does not have a qualifying dx under medicaid. CM will continue to follow for d/c needs

## 2017-02-26 NOTE — Progress Notes (Signed)
Patient ID: Daniel Li, male   DOB: 1956-08-07, 61 y.o.   MRN: [id number]   SUBJECTIVE:  Has a headache this morning. Anxious about going home. States it is a very high stress situation. States he is still occasional SOB but much improved from PTA.   Weight up 1 lb. Creatinine stable.   Echo (4/27) with EF 20%, diffuse hypokinesis, LV apical thrombus noted, normal RV, moderate MR.   Scheduled Meds: . aspirin EC  81 mg Oral Daily  . carvedilol  3.125 mg Oral BID WC  . clopidogrel  75 mg Oral Daily  . digoxin  0.125 mg Oral Daily  . ezetimibe  10 mg Oral Daily  . furosemide  40 mg Oral Daily  . glipiZIDE  10 mg Oral QAC breakfast  . insulin aspart  0-9 Units Subcutaneous TID WC  . losartan  12.5 mg Oral Daily  . pantoprazole (PROTONIX) IV  40 mg Intravenous QHS  . sodium chloride flush  3 mL Intravenous Q12H  . spironolactone  12.5 mg Oral QPM  . Warfarin - Pharmacist Dosing Inpatient   Does not apply q1800   Continuous Infusions: . sodium chloride Stopped (02/22/17 1807)  . heparin 1,050 Units/hr (02/26/17 0600)   PRN Meds:.sodium chloride, acetaminophen, morphine injection, nitroGLYCERIN, ondansetron (ZOFRAN) IV, sodium chloride flush    Vitals:   02/26/17 0400 02/26/17 0431 02/26/17 0500 02/26/17 0816  BP: (!) 89/63   99/61  Pulse: 74 70  72  Resp:      Temp: 98.9 F (37.2 C)   98.8 F (37.1 C)  TempSrc: Oral Oral  Oral  SpO2: 99%   96%  Weight:   138 lb 7.2 oz (62.8 kg)   Height:        Intake/Output Summary (Last 24 hours) at 02/26/17 0843 Last data filed at 02/26/17 0600  Gross per 24 hour  Intake           724.38 ml  Output             2725 ml  Net         -2000.62 ml    LABS: Basic Metabolic Panel:  Recent Labs  02/25/17 0305 02/26/17 0218  NA 139 139  K 3.9 3.8  CL 105 106  CO2 28 27  GLUCOSE 125* 119*  BUN 13 13  CREATININE 1.10 1.11  CALCIUM 8.9 8.8*   Liver Function Tests: No results for input(s): AST, ALT, ALKPHOS, BILITOT,  PROT, ALBUMIN in the last 72 hours. No results for input(s): LIPASE, AMYLASE in the last 72 hours. CBC:  Recent Labs  02/25/17 0305 02/26/17 0218  WBC 4.8 5.2  HGB 12.6* 13.0  HCT 38.4* 40.4  MCV 80.3 80.3  PLT 218 226   Cardiac Enzymes: No results for input(s): CKTOTAL, CKMB, CKMBINDEX, TROPONINI in the last 72 hours. BNP: Invalid input(s): POCBNP D-Dimer: No results for input(s): DDIMER in the last 72 hours. Hemoglobin A1C: No results for input(s): HGBA1C in the last 72 hours. Fasting Lipid Panel: No results for input(s): CHOL, HDL, LDLCALC, TRIG, CHOLHDL, LDLDIRECT in the last 72 hours. Thyroid Function Tests: No results for input(s): TSH, T4TOTAL, T3FREE, THYROIDAB in the last 72 hours.  Invalid input(s): FREET3 Anemia Panel: No results for input(s): VITAMINB12, FOLATE, FERRITIN, TIBC, IRON, RETICCTPCT in the last 72 hours.  RADIOLOGY: Dg Abd 1 View  Result Date: 02/21/2017 CLINICAL DATA:  61 year old with periumbilical abdominal pain and tenderness. Cardiac catheterization with stent placement earlier today. EXAM: Portable  ABDOMEN - 1 VIEW COMPARISON:  CT abdomen 04/14/2006. FINDINGS: Mild gaseous distention of several loops of jejunum in the left upper quadrant. Distal small bowel and colon of normal caliber. Large amount of stool in the rectum. Surgical anastomotic suture material in the left mid abdomen. Contrast material within the urinary bladder from the cardiac catheterization earlier today. Degenerative changes throughout the lower thoracic and lumbar spine. Interstitial and airspace pulmonary edema involving the visualized lung bases as described on the portable chest x-ray earlier today. IMPRESSION: Localized ileus in the left upper quadrant versus early partial small bowel obstruction. Ileus is favored. Electronically Signed   By: Evangeline Dakin M.D.   On: 02/21/2017 16:55   Dg Chest Port 1 View  Result Date: 02/24/2017 CLINICAL DATA:  CHF EXAM: PORTABLE CHEST  1 VIEW COMPARISON:  02/21/2017 FINDINGS: There is a small left pleural effusion. There is near complete resolution of bilateral perihilar interstitial and alveolar airspace opacities. There is no focal parenchymal opacity. There is no pneumothorax. Stable cardiomediastinal silhouette. The osseous structures are unremarkable. IMPRESSION: Interval resolution of CHF.  Small persistent left pleural effusion. Electronically Signed   By: Kathreen Devoid   On: 02/24/2017 09:38   Dg Chest Port 1 View  Result Date: 02/21/2017 CLINICAL DATA:  Respiratory failure with hypoxia. EXAM: PORTABLE CHEST 1 VIEW COMPARISON:  Radiographs of February 21, 2017. FINDINGS: Stable cardiomediastinal silhouette. Stable bilateral pulmonary edema is noted. No pneumothorax or pleural effusion is noted. Bony thorax is unremarkable. IMPRESSION: Stable bilateral pulmonary edema. Electronically Signed   By: Marijo Conception, M.D.   On: 02/21/2017 15:01   PHYSICAL EXAM  General: Fatigued appearing. NAD HEENT: normal Neck: supple. JVD 6-7. Carotids 2+ bilat; no bruits. No thyromegaly or nodule noted. Cor: PMI nondisplaced. RRR, No M/G/R noted Lungs: Diminished basilar sounds.  Abdomen: soft, non-tender, distended, no HSM. No bruits or masses. +BS  Extremities: no cyanosis, clubbing, rash, R and LLE no edema.  Neuro: alert & orientedx3, cranial nerves grossly intact. moves all 4 extremities w/o difficulty. Affect pleasant   TELEMETRY: Personally reviewed, NSR 70-80s   ASSESSMENT AND PLAN: 61 yo with long history of CAD s/p multiple ACS episodes, active smoking, HTN, hyperlipidemia, and DM presented this admission with anterior STEMI and pulmonary edema.   1. CAD: Recent anterior STEMI.  Extensive prior history of CAD.  He has known total occlusion LCx and PLV, DES to totally occluded LAD this admission.  CHF symptoms seemed more prominent and TnI only rose to 0.12, it is possible that his actual event (LAD occlusion) was a number of  days prior to admission based on symptom timing.  He did have some collaterals to the LAD system. No further ischemic-type chest pain.   - Continue coumadin with LV thrombus - Continue Plavix.  - He will continue ASA 81 for 1 month then stop (03/26/2017)   - No statin given history of rhabdomyolysis.  LDL 153.  Will continue Zetia (not sure if he was taking at home).  Ideally, will need to start PCSK9-inhibitor.  - No change.  2. Acute on chronic systolic CHF: Ischemic cardiomyoapthy.  EF 25% on LV-gram, LVEDP 38 on cath 4/26.  Pulmonary edema on admission CXR.  Echo with EF 20%, diffuse hypokinesis, LV thrombus noted, normal RV, moderate MR. 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  Volume status stable.  - Continue po lasix 40 mg daily  - SBP remain soft, preventing med titration at this time.  -  Continue low dose coreg 3.125 mg BID - Continue losartan 12.5 mg daily - Continue spiro 12.5 mg qhs - Will need repeat echo in 3 months to determine ICD candidacy. - Lifevest ordered.  3. Diabetes:  - Continue glipizide. Needs PCP follow up.  - Can resume home glipizide. May benefit from empagliflozin transition but will need to see if insurance would cover.  4. Smoking:  - Encouraged cessation.   5. Leg pain/cramps:  Bilateral.  Has history of rhabdomyolysis.  CK normal.   - ABIs with R ABI of 0.84 suggestive of mild disease. And L ABI of 1.27 (Normal flow at rest) 6. Abdominal pain:  - 2/2 illeus. Now resolved.  7. LV thrombus:  - INR 2.0 this am.  Will stop heparin.  - Will need coumadin clinic follow up arranged once dispo decided.   PT eval pending. ? [place] vs SNF. Per CSW notes, sister does not feel comfortable caring for him at home.   Have ordered Lifevest. Will check timing with rep.   Shirley Friar, PA-C  02/26/2017 8:43 AM   Advanced Heart Failure Team Pager [phone] (M-F; 7a - 4p)  Please contact [facility] for night-coverage after  hours (4p -7a ) and weekends on [email or web address]  Patient seen with PA, agree with the above note.  INR 2 today.  SBP 90s but stable, no lightheadedness.  No room to titrate meds.  I think that he can leave the hospital today.  Need PT to see if they recommend rehab stay, if not discharge home with services.    He will need close followup with me in CHF clinic, arrange for 1-2 weeks.  He will need coumadin clinic followup. He needs lipid clinic followup for [place] or [facility].  He will go home with a Lifevest.  Meds for home: Warfarin aim for INR 2-2.5, ASA 81 until 5/29 then stop, Plavix 75 daily, Lasix 40 mg daily, digoxin 0.125 daily, Coreg 3.125 bid, losartan 12.5 daily, spironolactone 12.5 daily, Zetia 10 daily.   [name] 02/26/2017 9:14 AM

## 2017-02-26 NOTE — Progress Notes (Signed)
Discharge instructions discussed with patient. All medications and when they are due discussed with patient at length.  Will give patient all PM medications prior to discharge, as patient is unsure he will make it to pharmacy before they close this evening.  Discussed need for patient to have a scale, heart failure notified and need for script for NTG SL.  Discussed Life vest with patient and went over all parts of life vest, battery, when to change it, when he could have the vest off.  Appointments were discussed as well.  Emphasized the importance of keeping all appointments and blood draws for his coumadin.  Education material discussed and sent with patient for review.  Patient voiced understanding and was relieved that HH RN would be out in the AM for visit.  Discussed with patient numbers to call for questions or concerns.  

## 2017-02-26 NOTE — Plan of Care (Signed)
Problem: Activity: Goal: Risk for activity intolerance will decrease Outcome: Completed/Met Date Met: 02/26/17 Patient able to ambulate with cardiac rehab and PT in hall.

## 2017-02-26 NOTE — Discharge Summary (Signed)
Advanced Heart Failure Discharge Note  Discharge Summary   Patient ID: Daniel Li MRN: [id number], DOB/AGE: 02-02-1956 61 y.o. Admit date: 02/21/2017 D/C date:     02/26/2017   Primary Discharge Diagnoses:  1. CAD s/p anterior STEMI 2. Acute on chronic systolic CHF 3. Ischemic cardiomyopathy.  4. Diabetes, type II 5. Tobacco abuse 6. LV thrombus 7. Abdominal illeus.  8. Hypertension 9. Hyperlipidemia.   Hospital Course: Mr. Wishon is a 61 year old male with a past medical history of CAD, HTN, HLD, ICM, OSA on CPAP and CVA. He presented to [facility] on 02/21/17 with chest pain, found to have an anterior STEMI. He was transferred to [facility] for urgent cardiac cath. He has a history of CAD with NSTEMI in 2007 with BMS placement to his D1 and OM2. Also with history of STEMI in May 2013 at which time he had a BMS placed to his RCA. NSTEMI in January of 2014, cath showed CTO of left circumflex, unable to intervene upon. EF 55-60% in September 2016.   Left heart cath report below. Cath showed known CTO of the PLV and circumflex. He had a proximal LAD lesion which was treated with a DES. He is not a CABG candidate. LV function was estimated at 15-20%, LVEDP was 38 mm Hg. Echo showed an EF of 20%, also with LV thrombus. Troponin peaked at 0.12, it was felt that he could be late presenting.   1. CAD: Recent anterior STEMI.  Extensive prior history of CAD.  He has known total occlusion LCx and PLV, DES to totally occluded LAD this admission.  CHF symptoms seemed more prominent and TnI only rose to 0.12, it is possible that his actual event (LAD occlusion) was a number of days prior to admission based on symptom timing.  He did have some collaterals to the LAD system. No further ischemic-type chest pain.   - Continue coumadin with LV thrombus. He will have his INR check on Thurs 02/28/17 at the coumadin clinic in [place].  - Continue Plavix.  - He will continue ASA 81 for 1 month then  stop (03/26/2017)   - No statin given history of rhabdomyolysis.  LDL 153.  Will continue Zetia (not sure if he was taking at home).  Ideally, will need to start PCSK9-inhibitor.   2. Acute on chronic systolic CHF: Ischemic cardiomyoapthy.  EF 25% on LV-gram, LVEDP 38 on cath 4/26.  Pulmonary edema on admission CXR.  Echo with EF 20%, diffuse hypokinesis, LV thrombus noted, normal RV, moderate MR. 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  Volume status stable.  - Continue po lasix 40 mg daily  - SBP remain soft, preventing med titration at this time.  - Continue low dose coreg 3.125 mg BID - Continue losartan 12.5 mg daily - Continue spiro 12.5 mg qhs - Digoxin 0.125mg   - Will need repeat echo in 3 months to determine ICD candidacy. - Lifevest ordered, and patient was fitted for vest and given education.   3. Diabetes:  - Continue glipizide. Needs PCP follow up.  - Can resume home glipizide. May benefit from empagliflozin transition but will need to see if insurance would cover.   4. Smoking:  - Encouraged cessation.    5. Leg pain/cramps:  Bilateral.  Has history of rhabdomyolysis.  CK normal.   - ABIs with R ABI of 0.84 suggestive of mild disease. And L ABI of 1.27 (Normal flow at rest)  6. Abdominal  pain:  - 2/2 illeus. Now resolved.   7. LV thrombus:  - On coumadin as above. Will have INR checked on 02/28/17 at [facility] in [place].      Discharge Weight: 138 pounds.  Discharge Vitals: Blood pressure 108/77, pulse 83, temperature 99 F (37.2 C), temperature source Oral, resp. rate 20, height 5\' 6"  (1.676 m), weight 138 lb 7.2 oz (62.8 kg), SpO2 97 %.  Labs: Lab Results  Component Value Date   WBC 5.2 02/26/2017   HGB 13.0 02/26/2017   HCT 40.4 02/26/2017   MCV 80.3 02/26/2017   PLT 226 02/26/2017    Recent Labs Lab 02/21/17 1302  02/26/17 0218  NA 135  < > 139  K 3.7  < > 3.8  CL 102  < > 106  CO2 22  < > 27  BUN 15  < > 13  CREATININE  1.09  < > 1.11  CALCIUM 8.6*  < > 8.8*  PROT 6.3*  --   --   BILITOT 0.4  --   --   ALKPHOS 71  --   --   ALT 36  --   --   AST 37  --   --   GLUCOSE 272*  < > 119*  < > = values in this interval not displayed. Lab Results  Component Value Date   CHOL 212 (H) 02/22/2017   HDL 55 02/22/2017   LDLCALC 140 (H) 02/22/2017   TRIG 84 02/22/2017   BNP (last 3 results)  Recent Labs  02/21/17 1302  BNP 1,119.4*      Diagnostic Studies/Procedures   Transthoracic Echocardiography 02/22/17 Study Conclusions  - Left ventricle: The cavity size was mildly dilated. Wall   thickness was increased in a pattern of mild LVH. 1.9 x 1.2 cm LV   apical thrombus noted. Diffuse severe hypokinesis with akinesis   at the apex. The estimated ejection fraction was 20%. Features   are consistent with a pseudonormal left ventricular filling   pattern, with concomitant abnormal relaxation and increased   filling pressure (grade 2 diastolic dysfunction). - Aortic valve: There was no stenosis. - Aorta: Mildly dilated aortic root and ascending aorta. Aortic   root dimension: 37 mm (ED). Ascending aortic diameter: 37 mm (S). - Mitral valve: Suspect there is a ruptured chord, do not see   evidence for papillary muscle rupture. There was moderate   regurgitation. - Left atrium: The atrium was moderately dilated. - Right ventricle: The cavity size was normal. Systolic function   was normal. - Pulmonary arteries: No complete TR doppler jet so unable to   estimate PA systolic pressure. - Inferior vena cava: The vessel was normal in size. The   respirophasic diameter changes were in the normal range (= 50%),   consistent with normal central venous pressure.  Impressions:  - Mildly dilated LV with severe diffuse hypokinesis and akinesis at   the apex (this suggests multiple coronary territories involved,   as seen on cardiac cath). There was an apical LV thrombus. EF   20%. Normal RV size and  systolic function. There appeared to be a   ruptured chord but no evidence for papillary muscle rupture,   moderate mitral regurgitation.   Discharge Medications   Allergies as of 02/26/2017      Reactions   Pork-derived Products Other (See Comments)   Does not eat for religious reasons - okay with using IV heparin      Medication List  STOP taking these medications   hydrALAZINE 25 MG tablet Commonly known as:  APRESOLINE   lisinopril 20 MG tablet Commonly known as:  PRINIVIL,ZESTRIL     TAKE these medications   aspirin EC 81 MG tablet Take 81 mg by mouth daily.   carvedilol 3.125 MG tablet Commonly known as:  COREG Take 1 tablet (3.125 mg total) by mouth 2 (two) times daily with a meal.   clopidogrel 75 MG tablet Commonly known as:  PLAVIX Take 1 tablet (75 mg total) by mouth daily. Start taking on:  02/27/2017   digoxin 0.125 MG tablet Commonly known as:  LANOXIN Take 1 tablet (0.125 mg total) by mouth daily. Start taking on:  02/27/2017   furosemide 40 MG tablet Commonly known as:  LASIX Take 1 tablet (40 mg total) by mouth daily. Start taking on:  02/27/2017 What changed:  See the new instructions.   glipiZIDE 10 MG 24 hr tablet Commonly known as:  GLUCOTROL XL Take 1 tablet (10 mg total) by mouth daily.   losartan 25 MG tablet Commonly known as:  COZAAR Take 0.5 tablets (12.5 mg total) by mouth daily. Start taking on:  02/27/2017   nitroGLYCERIN 0.4 MG SL tablet Commonly known as:  NITROSTAT PLACE ONE (1) TABLET UNDER TONGUE EVERY 5 MINUTES UP TO (3) DOSES AS NEEDED FOR CHEST PAIN.   spironolactone 25 MG tablet Commonly known as:  ALDACTONE Take 0.5 tablets (12.5 mg total) by mouth every evening. What changed:  See the new instructions.   warfarin 5 MG tablet Commonly known as:  COUMADIN Take 1.5 tablets (7.5 mg total) by mouth daily.   ZETIA 10 MG tablet Generic drug:  ezetimibe TAKE 1 TABLET ONCE DAILY.            Durable Medical  Equipment        Start     Ordered   02/25/17 [phone]  For home use only DME Vest life vest  Once     02/25/17 0851      Disposition   The patient will be discharged in stable condition to home. Discharge Instructions    (HEART FAILURE PATIENTS) Call MD:  Anytime you have any of the following symptoms: 1) 3 pound weight gain in 24 hours or 5 pounds in 1 week 2) shortness of breath, with or without a dry hacking cough 3) swelling in the hands, feet or stomach 4) if you have to sleep on extra pillows at night in order to breathe.    Complete by:  As directed    AMB Referral to Cardiac Rehabilitation - Phase II    Complete by:  As directed    Diagnosis:  STEMI   Amb Referral to Cardiac Rehabilitation    Complete by:  As directed    Diagnosis:   STEMI Coronary Stents     Diet - low sodium heart healthy    Complete by:  As directed    Heart Failure patients record your daily weight using the same scale at the same time of day    Complete by:  As directed    Increase activity slowly    Complete by:  As directed    STOP any activity that causes chest pain, shortness of breath, dizziness, sweating, or exessive weakness    Complete by:  As directed      Follow-up Information    Carlyle Dolly, MD Follow up on 03/12/2017.   Specialty:  Cardiology Why:  at 3:20 for hospital  follow up. Please arrive 15 min prior to your appointment time.  Contact information: [place] [id number] [place] Follow up on 02/28/2017.   Specialty:  Cardiology Why:  at 2:00pm INR check - blood work for Coumadin.  Contact information: [place] [phone]            Duration of Discharge Encounter: Greater than 35 minutes   Signed, Arbutus Leas, NP 02/26/2017, 12:26 PM

## 2017-02-26 NOTE — Progress Notes (Signed)
CARDIAC REHAB PHASE I   PRE:  Rate/Rhythm: 88 SR    BP: sitting 97/79    SaO2:   MODE:  Ambulation: 370 ft   POST:  Rate/Rhythm: 97 SR    BP: sitting 108/77     SaO2:   Pt able to walk with RW. Slow pace, steady. No c/o today. To recliner. Did not review ed as CM here to see him. [place], ACSM 02/26/2017 11:17 AM

## 2017-02-26 NOTE — Plan of Care (Signed)
Problem: Health Behavior/Discharge Planning: Goal: Ability to safely manage health-related needs after discharge will improve Outcome: Adequate for Discharge Patient will be discharged with Life Vest

## 2017-02-26 NOTE — Evaluation (Signed)
Physical Therapy Evaluation Patient Details Name: Daniel Li MRN: [id number] DOB: 1956/01/16 Today's Date: 02/26/2017   History of Present Illness  Pt is a 61 y.o. male admitted as transfer from [place] on 02/21/17 with STEMI. S/p emergent cath on 4/26. Found to have LV thrombus. CXR shows bilateral pulmonary edema. Pertinent PMH includes MI, CHF (EF 25%), DM, smoking, rhabdomyolysis, CVA, depression, DDD, CAD, anxiety, arthritis.   Clinical Impression  Pt presents to PT with generalized weakness, decreased activity tolerance, and an overall decrease in functional mobility secondary to above. PTA, pt mod indep with cane and lives at home alone; does not have 24/7 assist available. Today, pt amb in hallway with RW and min guard for balance. Educ on fall risk prevention and energy conservation. Pt reports indep with all ADLs and confidence in doing these tasks alone at home; states he would need assist for transportation as he does not drive. Discussed these needs with CM. Pt would benefit from continued acute PT services to maximize functional mobility and independence prior to d/c home.    Follow Up Recommendations No PT follow up;Supervision - Intermittent    Equipment Recommendations  3in1 (PT)    Recommendations for Other Services       Precautions / Restrictions Precautions Precautions: Fall Restrictions Weight Bearing Restrictions: No      Mobility  Bed Mobility               General bed mobility comments: Received sitting in chair  Transfers Overall transfer level: Needs assistance Equipment used: None Transfers: Sit to/from Stand Sit to Stand: Supervision         General transfer comment: Stood with supervision for safety; able to stand with no AD but prefers to use RW.  Ambulation/Gait Ambulation/Gait assistance: Min guard Ambulation Distance (Feet): 200 Feet Assistive device: Quad cane;Rolling walker (2 wheeled) Gait Pattern/deviations:  Step-through pattern;Decreased stride length Gait velocity: Decreased Gait velocity interpretation: Below normal speed for age/gender General Gait Details: Amb 16' with quad cane, pt with increased difficulty sequencing with cane and states he prefers to use RW which he will plan to use at home. Amb an additional 47' with RW and min guard for balance, signficantly slow gait speed; 1x standing rest break secondary to dizziness, BP stable.   Stairs            Wheelchair Mobility    Modified Rankin (Stroke Patients Only)       Balance Overall balance assessment: Needs assistance Sitting-balance support: No upper extremity supported;Feet supported Sitting balance-Leahy Scale: Fair       Standing balance-Leahy Scale: Fair Standing balance comment: Able to stand with no AD and min guard for balance; stability improved with UE support                             Pertinent Vitals/Pain Pain Assessment: No/denies pain    Home Living Family/patient expects to be discharged to:: Private residence Living Arrangements: Alone Available Help at Discharge: Friend(s);Available PRN/intermittently Type of Home: House Home Access: Ramped entrance     Home Layout: One level Home Equipment: Walker - 2 wheels;Cane - quad;Wheelchair - manual      Prior Function Level of Independence: Needs assistance   Gait / Transfers Assistance Needed: Mod indep with cane or RW. Does not drive; relies on bus or friends for transportation  ADL's / Homemaking Assistance Needed: Indep with ADLs; has friends help with housework if needed  Hand Dominance        Extremity/Trunk Assessment   Upper Extremity Assessment Upper Extremity Assessment: Generalized weakness    Lower Extremity Assessment Lower Extremity Assessment: Generalized weakness;RLE deficits/detail RLE Deficits / Details: Pt reports R knee "gives out" on him at home       Communication   Communication: Other  (comment) (Slight slurred speech)  Cognition Arousal/Alertness: Awake/alert Behavior During Therapy: WFL for tasks assessed/performed Overall Cognitive Status: Within Functional Limits for tasks assessed                                        General Comments      Exercises     Assessment/Plan    PT Assessment Patient needs continued PT services  PT Problem List Decreased strength;Decreased knowledge of use of DME;Decreased activity tolerance;Decreased balance;Decreased mobility;Cardiopulmonary status limiting activity       PT Treatment Interventions DME instruction;Gait training;Therapeutic exercise;Balance training;Functional mobility training;Stair training;Therapeutic activities;Patient/family education    PT Goals (Current goals can be found in the Care Plan section)  Acute Rehab PT Goals Patient Stated Goal: Return home with help if needed PT Goal Formulation: With patient Time For Goal Achievement: 03/12/17 Potential to Achieve Goals: Good    Frequency Min 3X/week   Barriers to discharge Decreased caregiver support Pt lives alone, has friends who can check on him periodically; will need assist for transport to appointments, groceries, etc.    Co-evaluation               AM-PAC PT "6 Clicks" Daily Activity  Outcome Measure Difficulty turning over in bed (including adjusting bedclothes, sheets and blankets)?: None Difficulty moving from lying on back to sitting on the side of the bed? : None Difficulty sitting down on and standing up from a chair with arms (e.g., wheelchair, bedside commode, etc,.)?: None Help needed moving to and from a bed to chair (including a wheelchair)?: None Help needed walking in hospital room?: A Little Help needed climbing 3-5 steps with a railing? : A Little 6 Click Score: 22    End of Session Equipment Utilized During Treatment: Gait belt Activity Tolerance: Patient tolerated treatment well Patient left: in  chair;with call bell/phone within reach Nurse Communication: Mobility status PT Visit Diagnosis: Other abnormalities of gait and mobility (R26.89)    Time: 8786-7672 PT Time Calculation (min) (ACUTE ONLY): 19 min   Charges:   PT Evaluation $PT Eval Moderate Complexity: 1 Procedure     PT G Codes:       Daniel Li, SPT Office-[phone]  Daniel Caras 02/26/2017, 12:35 PM

## 2017-02-26 NOTE — Progress Notes (Signed)
ANTICOAGULATION CONSULT NOTE - Follow Up Consult  Pharmacy Consult for heparin/coumadin (overlap day #5) Indication: LV thrombus  Allergies  Allergen Reactions  . Pork-Derived Products Other (See Comments)    Does not eat for religious reasons - okay with using IV heparin    Patient Measurements: Height: 5\' 6"  (167.6 cm) Weight: 138 lb 7.2 oz (62.8 kg) IBW/kg (Calculated) : 63.8 Heparin Dosing Weight: 64 kg  Vital Signs: Temp: 99 F (37.2 C) (05/01 1149) Temp Source: Oral (05/01 1149) BP: 108/77 (05/01 1149) Pulse Rate: 83 (05/01 1149)  Labs:  Recent Labs  02/24/17 0410 02/25/17 0305 02/26/17 0218  HGB 13.3 12.6* 13.0  HCT 41.4 38.4* 40.4  PLT 241 218 226  LABPROT 19.0* 20.2* 23.0*  INR 1.58 1.70 2.00  HEPARINUNFRC 0.56 0.70 0.45  CREATININE 1.06 1.10 1.11    Estimated Creatinine Clearance: 62.9 mL/min (by C-G formula based on SCr of 1.11 mg/dL).   Medications:  Infusions:  . sodium chloride Stopped (02/22/17 1807)    Assessment: 61 yo M admitted 02/21/2017 from [facility] with STEMI. He underwent cardiac cath 4/27 and sheath was removed this AM. Pharmacy consulted to start heparin while INR subtherapeutic. LV thrombus also noted on ECHO.  Heparin level 0.45 (therapeutic), INR also trending up to 2. CBC stable. No signs/symptoms of bleeding noted.  Goal of Therapy:  INR goal 2-3 Heparin level 0.3-0.7 units/ml  Monitor platelets by anticoagulation protocol: Yes   Plan:  - stop heparin  - Would prescribe warfarin 7.5mg  daily at d/c with INR check on Thursday, it is likely that he will need a couple of days per week of 5mg  - giving him a weekly dose closer to 47.5. Discussed with HF service.  - Would give him an Rx for 5mg  tablets   Erin Hearing PharmD., BCPS Clinical Pharmacist Pager [phone] 02/26/2017 12:11 PM

## 2017-02-27 DIAGNOSIS — I2109 ST elevation (STEMI) myocardial infarction involving other coronary artery of anterior wall: Secondary | ICD-10-CM | POA: Diagnosis not present

## 2017-02-28 ENCOUNTER — Ambulatory Visit (INDEPENDENT_AMBULATORY_CARE_PROVIDER_SITE_OTHER): Payer: Medicaid Other | Admitting: *Deleted

## 2017-02-28 DIAGNOSIS — I2129 ST elevation (STEMI) myocardial infarction involving other sites: Secondary | ICD-10-CM | POA: Diagnosis not present

## 2017-02-28 LAB — POCT INR: INR: 2.7

## 2017-03-01 ENCOUNTER — Other Ambulatory Visit: Payer: Self-pay | Admitting: *Deleted

## 2017-03-01 MED ORDER — EZETIMIBE 10 MG PO TABS: 10.0000 mg | ORAL_TABLET | Freq: Every day | ORAL | 0 refills | Status: DC

## 2017-03-04 ENCOUNTER — Telehealth: Payer: Self-pay | Admitting: *Deleted

## 2017-03-04 ENCOUNTER — Ambulatory Visit (INDEPENDENT_AMBULATORY_CARE_PROVIDER_SITE_OTHER): Payer: Medicaid Other | Admitting: *Deleted

## 2017-03-04 ENCOUNTER — Encounter: Payer: Self-pay | Admitting: *Deleted

## 2017-03-04 DIAGNOSIS — I2129 ST elevation (STEMI) myocardial infarction involving other sites: Secondary | ICD-10-CM

## 2017-03-04 LAB — POCT INR: INR: 1.6

## 2017-03-04 NOTE — Telephone Encounter (Signed)
Letter sent - pt aware

## 2017-03-04 NOTE — Telephone Encounter (Signed)
Pt requesting Dr Harl Bowie write letter for tomorrow to excuse him from court tomorrow since recent d/c from hospital - pt has appt 5/15

## 2017-03-04 NOTE — Telephone Encounter (Signed)
To whom it may concern,  Mr Daniel Li is followed in our cardiology clinic for multiple serious cardiac conditions. He recently had a severe heart attack requiring hospital admission, and continues to recover. Please excuse him from court obligations at this time during this recovery process   Carlyle Dolly MD

## 2017-03-07 ENCOUNTER — Ambulatory Visit (INDEPENDENT_AMBULATORY_CARE_PROVIDER_SITE_OTHER): Payer: Medicaid Other | Admitting: *Deleted

## 2017-03-07 ENCOUNTER — Telehealth: Payer: Self-pay | Admitting: Cardiology

## 2017-03-07 DIAGNOSIS — I2129 ST elevation (STEMI) myocardial infarction involving other sites: Secondary | ICD-10-CM

## 2017-03-07 LAB — POCT INR: INR: 2.6

## 2017-03-07 NOTE — Telephone Encounter (Signed)
Daniel Li [facility]  pt 30.6 inr 2.6

## 2017-03-07 NOTE — Telephone Encounter (Signed)
Done.  See coumadin note. 

## 2017-03-12 ENCOUNTER — Ambulatory Visit: Payer: Medicaid Other | Admitting: Cardiology

## 2017-03-12 ENCOUNTER — Ambulatory Visit (INDEPENDENT_AMBULATORY_CARE_PROVIDER_SITE_OTHER): Payer: Medicaid Other | Admitting: *Deleted

## 2017-03-12 DIAGNOSIS — I2129 ST elevation (STEMI) myocardial infarction involving other sites: Secondary | ICD-10-CM

## 2017-03-12 LAB — POCT INR: INR: 3.3

## 2017-03-13 ENCOUNTER — Ambulatory Visit (INDEPENDENT_AMBULATORY_CARE_PROVIDER_SITE_OTHER): Payer: Medicaid Other | Admitting: Cardiology

## 2017-03-13 ENCOUNTER — Encounter: Payer: Self-pay | Admitting: Cardiology

## 2017-03-13 VITALS — BP 100/69 | HR 89 | Ht 66.0 in | Wt 155.0 lb

## 2017-03-13 DIAGNOSIS — I5022 Chronic systolic (congestive) heart failure: Secondary | ICD-10-CM | POA: Diagnosis not present

## 2017-03-13 DIAGNOSIS — E782 Mixed hyperlipidemia: Secondary | ICD-10-CM

## 2017-03-13 DIAGNOSIS — I2510 Atherosclerotic heart disease of native coronary artery without angina pectoris: Secondary | ICD-10-CM | POA: Diagnosis not present

## 2017-03-13 DIAGNOSIS — I10 Essential (primary) hypertension: Secondary | ICD-10-CM

## 2017-03-13 NOTE — Patient Instructions (Signed)
Your physician recommends that you schedule a follow-up appointment in: [place] has recommended you make the following change in your medication:   START REPATHA 140 MG INJECTION EVERY 2 WEEKS - WE WILL CONTACT PHARMACY FOR YOU REGARDING GETTING THIS MEDICATION STARTED   TAKE ASPIRIN 81 MG DAILY FOR THE NEXT 10 DAYS THEN STOP   Thank you for choosing [facility]!!
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
## 2017-03-18 ENCOUNTER — Ambulatory Visit (INDEPENDENT_AMBULATORY_CARE_PROVIDER_SITE_OTHER): Payer: Medicaid Other | Admitting: *Deleted

## 2017-03-18 DIAGNOSIS — I2129 ST elevation (STEMI) myocardial infarction involving other sites: Secondary | ICD-10-CM

## 2017-03-18 LAB — POCT INR: INR: 5.2

## 2017-03-19 ENCOUNTER — Other Ambulatory Visit: Payer: Self-pay | Admitting: *Deleted

## 2017-03-19 MED ORDER — EVOLOCUMAB 140 MG/ML ~~LOC~~ SOAJ: 140.0000 mg | SUBCUTANEOUS | 3 refills | Status: DC

## 2017-03-21 ENCOUNTER — Ambulatory Visit (INDEPENDENT_AMBULATORY_CARE_PROVIDER_SITE_OTHER): Payer: Medicaid Other | Admitting: *Deleted

## 2017-03-21 DIAGNOSIS — I2129 ST elevation (STEMI) myocardial infarction involving other sites: Secondary | ICD-10-CM

## 2017-03-21 LAB — POCT INR: INR: 1.9

## 2017-03-27 ENCOUNTER — Other Ambulatory Visit: Payer: Self-pay | Admitting: *Deleted

## 2017-03-27 ENCOUNTER — Ambulatory Visit (INDEPENDENT_AMBULATORY_CARE_PROVIDER_SITE_OTHER): Payer: Medicaid Other | Admitting: *Deleted

## 2017-03-27 DIAGNOSIS — I2129 ST elevation (STEMI) myocardial infarction involving other sites: Secondary | ICD-10-CM

## 2017-03-27 LAB — POCT INR: INR: 3.8

## 2017-03-27 MED ORDER — EZETIMIBE 10 MG PO TABS: 10.0000 mg | ORAL_TABLET | Freq: Every day | ORAL | 3 refills | Status: AC

## 2017-04-03 ENCOUNTER — Ambulatory Visit (INDEPENDENT_AMBULATORY_CARE_PROVIDER_SITE_OTHER): Payer: Medicaid Other | Admitting: *Deleted

## 2017-04-03 DIAGNOSIS — I2129 ST elevation (STEMI) myocardial infarction involving other sites: Secondary | ICD-10-CM

## 2017-04-03 LAB — POCT INR: INR: 2.9

## 2017-04-11 ENCOUNTER — Telehealth: Payer: Self-pay | Admitting: Cardiology

## 2017-04-11 ENCOUNTER — Ambulatory Visit (INDEPENDENT_AMBULATORY_CARE_PROVIDER_SITE_OTHER): Payer: Medicaid Other | Admitting: Cardiology

## 2017-04-11 DIAGNOSIS — I2129 ST elevation (STEMI) myocardial infarction involving other sites: Secondary | ICD-10-CM

## 2017-04-11 LAB — POCT INR: INR: 2.7

## 2017-04-11 NOTE — Telephone Encounter (Signed)
See coumadin encounter of this date 

## 2017-04-11 NOTE — Telephone Encounter (Signed)
Pt  31.9  inr  2.7

## 2017-04-15 ENCOUNTER — Ambulatory Visit: Payer: Medicaid Other | Admitting: Cardiology

## 2017-04-16 ENCOUNTER — Ambulatory Visit (INDEPENDENT_AMBULATORY_CARE_PROVIDER_SITE_OTHER): Payer: Medicaid Other | Admitting: *Deleted

## 2017-04-16 ENCOUNTER — Telehealth: Payer: Self-pay | Admitting: Cardiology

## 2017-04-16 DIAGNOSIS — I2129 ST elevation (STEMI) myocardial infarction involving other sites: Secondary | ICD-10-CM

## 2017-04-16 LAB — POCT INR: INR: 3.1

## 2017-04-16 NOTE — Telephone Encounter (Signed)
Cathy with [facility] called.  PT  36.8  INR  3.1 Please call [phone]

## 2017-04-16 NOTE — Telephone Encounter (Signed)
Done.  See coumadin note. 
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
## 2017-04-18 ENCOUNTER — Ambulatory Visit: Payer: Medicaid Other | Admitting: Cardiology

## 2017-04-22 ENCOUNTER — Encounter (HOSPITAL_COMMUNITY): Payer: Medicaid Other

## 2017-04-23 ENCOUNTER — Ambulatory Visit: Payer: Medicaid Other | Admitting: Cardiovascular Disease

## 2017-04-23 ENCOUNTER — Encounter: Payer: Self-pay | Admitting: *Deleted

## 2017-04-23 ENCOUNTER — Encounter: Payer: Self-pay | Admitting: Cardiovascular Disease

## 2017-04-24 ENCOUNTER — Ambulatory Visit (INDEPENDENT_AMBULATORY_CARE_PROVIDER_SITE_OTHER): Payer: Medicaid Other | Admitting: *Deleted

## 2017-04-24 DIAGNOSIS — I2129 ST elevation (STEMI) myocardial infarction involving other sites: Secondary | ICD-10-CM

## 2017-04-24 LAB — POCT INR: INR: 1.8

## 2017-04-25 ENCOUNTER — Emergency Department (HOSPITAL_COMMUNITY)
Admission: EM | Admit: 2017-04-25 | Discharge: 2017-04-25 | Disposition: A | Discharge status: Home / Self Care | Payer: Medicaid Other | Attending: Emergency Medicine | Admitting: Emergency Medicine

## 2017-04-25 ENCOUNTER — Emergency Department (HOSPITAL_COMMUNITY): Payer: Medicaid Other

## 2017-04-25 ENCOUNTER — Encounter (HOSPITAL_COMMUNITY): Payer: Self-pay | Admitting: Emergency Medicine

## 2017-04-25 DIAGNOSIS — Z87891 Personal history of nicotine dependence: Secondary | ICD-10-CM | POA: Insufficient documentation

## 2017-04-25 DIAGNOSIS — I252 Old myocardial infarction: Secondary | ICD-10-CM | POA: Diagnosis not present

## 2017-04-25 DIAGNOSIS — Z7984 Long term (current) use of oral hypoglycemic drugs: Secondary | ICD-10-CM | POA: Diagnosis not present

## 2017-04-25 DIAGNOSIS — R079 Chest pain, unspecified: Secondary | ICD-10-CM

## 2017-04-25 DIAGNOSIS — E119 Type 2 diabetes mellitus without complications: Secondary | ICD-10-CM | POA: Diagnosis not present

## 2017-04-25 DIAGNOSIS — I2510 Atherosclerotic heart disease of native coronary artery without angina pectoris: Secondary | ICD-10-CM | POA: Insufficient documentation

## 2017-04-25 DIAGNOSIS — Z955 Presence of coronary angioplasty implant and graft: Secondary | ICD-10-CM | POA: Diagnosis not present

## 2017-04-25 DIAGNOSIS — F1410 Cocaine abuse, uncomplicated: Secondary | ICD-10-CM | POA: Insufficient documentation

## 2017-04-25 DIAGNOSIS — F10920 Alcohol use, unspecified with intoxication, uncomplicated: Secondary | ICD-10-CM

## 2017-04-25 DIAGNOSIS — F10120 Alcohol abuse with intoxication, uncomplicated: Secondary | ICD-10-CM | POA: Diagnosis not present

## 2017-04-25 DIAGNOSIS — Z7901 Long term (current) use of anticoagulants: Secondary | ICD-10-CM | POA: Diagnosis not present

## 2017-04-25 DIAGNOSIS — I959 Hypotension, unspecified: Secondary | ICD-10-CM | POA: Diagnosis not present

## 2017-04-25 DIAGNOSIS — I110 Hypertensive heart disease with heart failure: Secondary | ICD-10-CM | POA: Insufficient documentation

## 2017-04-25 DIAGNOSIS — I5043 Acute on chronic combined systolic (congestive) and diastolic (congestive) heart failure: Secondary | ICD-10-CM | POA: Diagnosis not present

## 2017-04-25 DIAGNOSIS — Z79899 Other long term (current) drug therapy: Secondary | ICD-10-CM | POA: Insufficient documentation

## 2017-04-25 LAB — CBC
HCT: 39.1 % (ref 39.0–52.0)
HEMOGLOBIN: 12.4 g/dL — AB (ref 13.0–17.0)
MCH: 25.8 pg — AB (ref 26.0–34.0)
MCHC: 31.7 g/dL (ref 30.0–36.0)
MCV: 81.3 fL (ref 78.0–100.0)
Platelets: 192 10*3/uL (ref 150–400)
RBC: 4.81 MIL/uL (ref 4.22–5.81)
RDW: 16.7 % — ABNORMAL HIGH (ref 11.5–15.5)
WBC: 7.1 10*3/uL (ref 4.0–10.5)

## 2017-04-25 LAB — COMPREHENSIVE METABOLIC PANEL
ALBUMIN: 3.6 g/dL (ref 3.5–5.0)
ALK PHOS: 40 U/L (ref 38–126)
ALT: 18 U/L (ref 17–63)
AST: 27 U/L (ref 15–41)
Anion gap: 6 (ref 5–15)
BILIRUBIN TOTAL: 0.6 mg/dL (ref 0.3–1.2)
BUN: 9 mg/dL (ref 6–20)
CALCIUM: 8.9 mg/dL (ref 8.9–10.3)
CO2: 23 mmol/L (ref 22–32)
Chloride: 109 mmol/L (ref 101–111)
Creatinine, Ser: 1.06 mg/dL (ref 0.61–1.24)
GFR calc Af Amer: >60 mL/min (ref >60)
GFR calc non Af Amer: >60 mL/min (ref >60)
GLUCOSE: 84 mg/dL (ref 65–99)
POTASSIUM: 3.1 mmol/L — AB (ref 3.5–5.1)
Sodium: 138 mmol/L (ref 135–145)
TOTAL PROTEIN: 6 g/dL — AB (ref 6.5–8.1)

## 2017-04-25 LAB — I-STAT TROPONIN, ED
TROPONIN I, POC: 0.08 ng/mL (ref 0.00–0.08)
Troponin i, poc: 0.07 ng/mL (ref 0.00–0.08)

## 2017-04-25 LAB — RAPID URINE DRUG SCREEN, HOSP PERFORMED
Amphetamines: NOT DETECTED
BENZODIAZEPINES: NOT DETECTED
Barbiturates: NOT DETECTED
Cocaine: POSITIVE — AB
Opiates: NOT DETECTED
TETRAHYDROCANNABINOL: NOT DETECTED

## 2017-04-25 LAB — ETHANOL: Alcohol, Ethyl (B): 181 mg/dL — ABNORMAL HIGH (ref <5)

## 2017-04-25 MED ORDER — SODIUM CHLORIDE 0.9 % IV BOLUS (SEPSIS)
500.0000 mL | Freq: Once | INTRAVENOUS | Status: AC
  Administered 2017-04-25: 500 mL via INTRAVENOUS

## 2017-04-25 MED ORDER — OXYCODONE-ACETAMINOPHEN 5-325 MG PO TABS: 1.0000 | ORAL_TABLET | Freq: Once | ORAL | Status: DC

## 2017-04-25 NOTE — ED Triage Notes (Signed)
[place] [facility] pt brough in for chest pain, pt rates 9/10 was given 324 ASA and NTG x 3, pt reports life vest shocked him 6 times today. Twice during transport. Ems personal report no changes in ecg rhythm at the time.

## 2017-04-25 NOTE — ED Notes (Signed)
1 liter bolus finished, pt is alert and speaking. [employer] rep at bedside interrogating life vest. Pt appears intoxicated
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
## 2017-04-25 NOTE — ED Notes (Signed)
EDP made aware of BP. 

## 2017-04-26 ENCOUNTER — Telehealth: Payer: Self-pay | Admitting: *Deleted

## 2017-04-26 NOTE — Telephone Encounter (Signed)
[place] [employer] PA for [place] was denied thorugh [place] [employer] website portal - routed to Dr Harl Bowie

## 2017-04-29 ENCOUNTER — Telehealth: Payer: Self-pay | Admitting: *Deleted

## 2017-04-29 ENCOUNTER — Encounter: Payer: Self-pay | Admitting: *Deleted

## 2017-04-29 NOTE — Telephone Encounter (Signed)
Please send the following:  To whom in may concern,  Daniel Li is followed in our cardiology clinic. Due to multiple ongoing health issues, including recovering from a major heart attack just 2 months ago, please consider excusing any court obligations at this time. He has had multiple emergency room evaluations over the last few weeks for various ongoing symptoms, and from a medical standpoint is not fit to complete court responsibilities at this time   Carlyle Dolly MD

## 2017-04-29 NOTE — Telephone Encounter (Signed)
Letter sent to clerk of court as requested - pt made aware and appreciative

## 2017-04-29 NOTE — Telephone Encounter (Signed)
Pt requesting Dr Harl Bowie send letter to excuse him from court tomorrow - says he was at  Hospital ED (will request records) for weakness/headache and says he is unable to report for court tomorrow. Routed to Dr Harl Bowie

## 2017-05-09 ENCOUNTER — Ambulatory Visit (INDEPENDENT_AMBULATORY_CARE_PROVIDER_SITE_OTHER): Payer: Medicaid Other | Admitting: *Deleted

## 2017-05-09 DIAGNOSIS — I2129 ST elevation (STEMI) myocardial infarction involving other sites: Secondary | ICD-10-CM

## 2017-05-09 LAB — POCT INR: INR: 1.9

## 2017-05-18 ENCOUNTER — Encounter (HOSPITAL_COMMUNITY): Payer: Self-pay

## 2017-05-18 ENCOUNTER — Emergency Department (HOSPITAL_COMMUNITY)
Admission: EM | Admit: 2017-05-18 | Discharge: 2017-05-18 | Disposition: A | Discharge status: Home / Self Care | Payer: Medicaid Other | Attending: Emergency Medicine | Admitting: Emergency Medicine

## 2017-05-18 ENCOUNTER — Emergency Department (HOSPITAL_COMMUNITY): Payer: Medicaid Other

## 2017-05-18 DIAGNOSIS — I110 Hypertensive heart disease with heart failure: Secondary | ICD-10-CM | POA: Diagnosis not present

## 2017-05-18 DIAGNOSIS — I5032 Chronic diastolic (congestive) heart failure: Secondary | ICD-10-CM | POA: Diagnosis not present

## 2017-05-18 DIAGNOSIS — E119 Type 2 diabetes mellitus without complications: Secondary | ICD-10-CM | POA: Diagnosis not present

## 2017-05-18 DIAGNOSIS — Z79899 Other long term (current) drug therapy: Secondary | ICD-10-CM | POA: Diagnosis not present

## 2017-05-18 DIAGNOSIS — Z7902 Long term (current) use of antithrombotics/antiplatelets: Secondary | ICD-10-CM | POA: Insufficient documentation

## 2017-05-18 DIAGNOSIS — Y999 Unspecified external cause status: Secondary | ICD-10-CM | POA: Diagnosis not present

## 2017-05-18 DIAGNOSIS — Y929 Unspecified place or not applicable: Secondary | ICD-10-CM | POA: Insufficient documentation

## 2017-05-18 DIAGNOSIS — S4991XA Unspecified injury of right shoulder and upper arm, initial encounter: Secondary | ICD-10-CM | POA: Diagnosis present

## 2017-05-18 DIAGNOSIS — Z8673 Personal history of transient ischemic attack (TIA), and cerebral infarction without residual deficits: Secondary | ICD-10-CM | POA: Insufficient documentation

## 2017-05-18 DIAGNOSIS — Z7984 Long term (current) use of oral hypoglycemic drugs: Secondary | ICD-10-CM | POA: Diagnosis not present

## 2017-05-18 DIAGNOSIS — Z7901 Long term (current) use of anticoagulants: Secondary | ICD-10-CM | POA: Insufficient documentation

## 2017-05-18 DIAGNOSIS — S46811A Strain of other muscles, fascia and tendons at shoulder and upper arm level, right arm, initial encounter: Secondary | ICD-10-CM | POA: Diagnosis not present

## 2017-05-18 DIAGNOSIS — I2510 Atherosclerotic heart disease of native coronary artery without angina pectoris: Secondary | ICD-10-CM | POA: Insufficient documentation

## 2017-05-18 DIAGNOSIS — Y939 Activity, unspecified: Secondary | ICD-10-CM | POA: Diagnosis not present

## 2017-05-18 DIAGNOSIS — Y33XXXA Other specified events, undetermined intent, initial encounter: Secondary | ICD-10-CM | POA: Diagnosis not present

## 2017-05-18 DIAGNOSIS — Z955 Presence of coronary angioplasty implant and graft: Secondary | ICD-10-CM | POA: Diagnosis not present

## 2017-05-18 DIAGNOSIS — Z87891 Personal history of nicotine dependence: Secondary | ICD-10-CM | POA: Insufficient documentation

## 2017-05-18 LAB — CBC
HEMATOCRIT: 40 % (ref 39.0–52.0)
HEMOGLOBIN: 12.8 g/dL — AB (ref 13.0–17.0)
MCH: 26 pg (ref 26.0–34.0)
MCHC: 32 g/dL (ref 30.0–36.0)
MCV: 81.3 fL (ref 78.0–100.0)
Platelets: 222 10*3/uL (ref 150–400)
RBC: 4.92 MIL/uL (ref 4.22–5.81)
RDW: 17.1 % — ABNORMAL HIGH (ref 11.5–15.5)
WBC: 7 10*3/uL (ref 4.0–10.5)

## 2017-05-18 LAB — BASIC METABOLIC PANEL
ANION GAP: 10 (ref 5–15)
BUN: 10 mg/dL (ref 6–20)
CO2: 23 mmol/L (ref 22–32)
Calcium: 8.6 mg/dL — ABNORMAL LOW (ref 8.9–10.3)
Chloride: 109 mmol/L (ref 101–111)
Creatinine, Ser: 0.98 mg/dL (ref 0.61–1.24)
GFR calc Af Amer: >60 mL/min (ref >60)
GFR calc non Af Amer: >60 mL/min (ref >60)
GLUCOSE: 87 mg/dL (ref 65–99)
POTASSIUM: 3.4 mmol/L — AB (ref 3.5–5.1)
Sodium: 142 mmol/L (ref 135–145)

## 2017-05-18 LAB — I-STAT TROPONIN, ED: Troponin i, poc: 0.03 ng/mL (ref 0.00–0.08)

## 2017-05-18 MED ORDER — DIAZEPAM 2 MG PO TABS
2.0000 mg | ORAL_TABLET | Freq: Once | ORAL | Status: AC
  Administered 2017-05-18: 2 mg via ORAL
  Filled 2017-05-18: qty 1

## 2017-05-18 MED ORDER — ACETAMINOPHEN 500 MG PO TABS
1000.0000 mg | ORAL_TABLET | Freq: Once | ORAL | Status: AC
  Administered 2017-05-18: 1000 mg via ORAL
  Filled 2017-05-18: qty 2

## 2017-05-18 MED ORDER — OXYCODONE HCL 5 MG PO TABS
5.0000 mg | ORAL_TABLET | Freq: Once | ORAL | Status: AC
  Administered 2017-05-18: 5 mg via ORAL
  Filled 2017-05-18: qty 1
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
## 2017-05-18 NOTE — Progress Notes (Signed)
Orthopedic Tech Progress Note Patient Details:  Daniel Li [birth year] [id number]  Ortho Devices Type of Ortho Device: Arm sling Ortho Device/Splint Location: right arm Ortho Device/Splint Interventions: Application, Adjustment   Kristopher Oppenheim 05/18/2017, 4:39 AM

## 2017-05-18 NOTE — ED Notes (Signed)
Patient states that he has been having pain on and off for the last 2 days.

## 2017-05-18 NOTE — Discharge Instructions (Signed)
Tylenol 1-2 tabs po q4h prn  Follow up with your PCP.
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
## 2017-05-18 NOTE — ED Triage Notes (Signed)
Patient here for chest pain that is central radiating to the right arm.  324 ASA and 1 Nitro given. Patient is wearing a lifevest.  Has elevation for EMS in L2, L3, L4.  Repeat EKG shot here and given to [facility] MD.  Patient does have ETOH on board, can talk in complete sentences but has some delayed responses.

## 2017-05-23 ENCOUNTER — Encounter (HOSPITAL_COMMUNITY): Payer: Medicaid Other

## 2017-05-28 ENCOUNTER — Ambulatory Visit (INDEPENDENT_AMBULATORY_CARE_PROVIDER_SITE_OTHER): Payer: Medicaid Other | Admitting: *Deleted

## 2017-05-28 DIAGNOSIS — I2129 ST elevation (STEMI) myocardial infarction involving other sites: Secondary | ICD-10-CM

## 2017-05-28 LAB — POCT INR: INR: 3.8

## 2017-05-29 ENCOUNTER — Telehealth: Payer: Self-pay | Admitting: Cardiology

## 2017-05-29 NOTE — Telephone Encounter (Signed)
Mr. Dahir is moving to [facility] on 05/31/17.  [facility] has requested that his Pharmacy send [place] a list of current medications to compare with our list.  They only need Dr. Sandre Kitty.  Please complete and fax to [phone] attention :  Burman Freestone

## 2017-05-30 ENCOUNTER — Encounter (HOSPITAL_COMMUNITY): Payer: Self-pay

## 2017-05-30 ENCOUNTER — Other Ambulatory Visit (HOSPITAL_COMMUNITY): Payer: Self-pay

## 2017-05-30 ENCOUNTER — Other Ambulatory Visit: Payer: Self-pay | Admitting: *Deleted

## 2017-05-30 DIAGNOSIS — I5022 Chronic systolic (congestive) heart failure: Secondary | ICD-10-CM

## 2017-05-30 NOTE — Progress Notes (Signed)
[employer] renewal order due for patient. Patient was never seen back in CHF clinic post hosp discharge. Will have renewal order signed under Dr. Aundra Dubin who saw patient in hospital and schedule patient for follow up and echo with Dr. Aundra Dubin to determine further renewal vs DC vs EP referral.  Renee Pain, RN

## 2017-05-30 NOTE — Telephone Encounter (Signed)
Faxed as requested

## 2017-06-04 ENCOUNTER — Ambulatory Visit: Payer: Medicaid Other | Admitting: Cardiovascular Disease

## 2017-06-04 ENCOUNTER — Encounter: Payer: Self-pay | Admitting: *Deleted

## 2017-06-04 NOTE — Progress Notes (Signed)
[facility] sent approval for Repatha has been approved with $3 co pay - approval sent for scanning

## 2017-06-06 ENCOUNTER — Emergency Department (HOSPITAL_COMMUNITY): Payer: Medicaid Other

## 2017-06-06 ENCOUNTER — Encounter (HOSPITAL_COMMUNITY): Payer: Self-pay | Admitting: Pharmacy Technician

## 2017-06-06 ENCOUNTER — Inpatient Hospital Stay (HOSPITAL_COMMUNITY)
Admission: EM | Admit: 2017-06-06 | Discharge: 2017-06-11 | DRG: 287 | Disposition: A | Discharge status: Home / Self Care | Payer: Medicaid Other | Attending: Internal Medicine | Admitting: Internal Medicine

## 2017-06-06 DIAGNOSIS — G4733 Obstructive sleep apnea (adult) (pediatric): Secondary | ICD-10-CM | POA: Diagnosis present

## 2017-06-06 DIAGNOSIS — Z79899 Other long term (current) drug therapy: Secondary | ICD-10-CM

## 2017-06-06 DIAGNOSIS — E1140 Type 2 diabetes mellitus with diabetic neuropathy, unspecified: Secondary | ICD-10-CM | POA: Diagnosis present

## 2017-06-06 DIAGNOSIS — I236 Thrombosis of atrium, auricular appendage, and ventricle as current complications following acute myocardial infarction: Secondary | ICD-10-CM | POA: Diagnosis present

## 2017-06-06 DIAGNOSIS — K219 Gastro-esophageal reflux disease without esophagitis: Secondary | ICD-10-CM | POA: Diagnosis present

## 2017-06-06 DIAGNOSIS — E7800 Pure hypercholesterolemia, unspecified: Secondary | ICD-10-CM | POA: Diagnosis present

## 2017-06-06 DIAGNOSIS — Z955 Presence of coronary angioplasty implant and graft: Secondary | ICD-10-CM

## 2017-06-06 DIAGNOSIS — I10 Essential (primary) hypertension: Secondary | ICD-10-CM | POA: Diagnosis present

## 2017-06-06 DIAGNOSIS — I5023 Acute on chronic systolic (congestive) heart failure: Secondary | ICD-10-CM

## 2017-06-06 DIAGNOSIS — Z8249 Family history of ischemic heart disease and other diseases of the circulatory system: Secondary | ICD-10-CM

## 2017-06-06 DIAGNOSIS — I255 Ischemic cardiomyopathy: Secondary | ICD-10-CM | POA: Diagnosis present

## 2017-06-06 DIAGNOSIS — G8929 Other chronic pain: Secondary | ICD-10-CM | POA: Diagnosis present

## 2017-06-06 DIAGNOSIS — Z8673 Personal history of transient ischemic attack (TIA), and cerebral infarction without residual deficits: Secondary | ICD-10-CM

## 2017-06-06 DIAGNOSIS — I513 Intracardiac thrombosis, not elsewhere classified: Secondary | ICD-10-CM | POA: Diagnosis present

## 2017-06-06 DIAGNOSIS — Z823 Family history of stroke: Secondary | ICD-10-CM

## 2017-06-06 DIAGNOSIS — E785 Hyperlipidemia, unspecified: Secondary | ICD-10-CM | POA: Diagnosis present

## 2017-06-06 DIAGNOSIS — Z87891 Personal history of nicotine dependence: Secondary | ICD-10-CM

## 2017-06-06 DIAGNOSIS — R7989 Other specified abnormal findings of blood chemistry: Secondary | ICD-10-CM

## 2017-06-06 DIAGNOSIS — I5042 Chronic combined systolic (congestive) and diastolic (congestive) heart failure: Secondary | ICD-10-CM | POA: Diagnosis present

## 2017-06-06 DIAGNOSIS — R791 Abnormal coagulation profile: Secondary | ICD-10-CM | POA: Diagnosis present

## 2017-06-06 DIAGNOSIS — I110 Hypertensive heart disease with heart failure: Secondary | ICD-10-CM | POA: Diagnosis present

## 2017-06-06 DIAGNOSIS — Z7901 Long term (current) use of anticoagulants: Secondary | ICD-10-CM

## 2017-06-06 DIAGNOSIS — R079 Chest pain, unspecified: Secondary | ICD-10-CM

## 2017-06-06 DIAGNOSIS — I252 Old myocardial infarction: Secondary | ICD-10-CM

## 2017-06-06 DIAGNOSIS — J449 Chronic obstructive pulmonary disease, unspecified: Secondary | ICD-10-CM | POA: Diagnosis present

## 2017-06-06 DIAGNOSIS — I714 Abdominal aortic aneurysm, without rupture: Secondary | ICD-10-CM | POA: Diagnosis present

## 2017-06-06 DIAGNOSIS — G253 Myoclonus: Secondary | ICD-10-CM

## 2017-06-06 DIAGNOSIS — I214 Non-ST elevation (NSTEMI) myocardial infarction: Secondary | ICD-10-CM | POA: Diagnosis present

## 2017-06-06 DIAGNOSIS — R778 Other specified abnormalities of plasma proteins: Secondary | ICD-10-CM

## 2017-06-06 DIAGNOSIS — E118 Type 2 diabetes mellitus with unspecified complications: Secondary | ICD-10-CM | POA: Diagnosis present

## 2017-06-06 DIAGNOSIS — Z9889 Other specified postprocedural states: Secondary | ICD-10-CM

## 2017-06-06 DIAGNOSIS — I25110 Atherosclerotic heart disease of native coronary artery with unstable angina pectoris: Principal | ICD-10-CM | POA: Diagnosis present

## 2017-06-06 LAB — CBC WITH DIFFERENTIAL/PLATELET
BASOS ABS: 0 10*3/uL (ref 0.0–0.1)
Basophils Relative: 0 %
EOS PCT: 1 %
Eosinophils Absolute: 0.1 10*3/uL (ref 0.0–0.7)
HCT: 38.4 % — ABNORMAL LOW (ref 39.0–52.0)
Hemoglobin: 12.3 g/dL — ABNORMAL LOW (ref 13.0–17.0)
LYMPHS PCT: 29 %
Lymphs Abs: 1.7 10*3/uL (ref 0.7–4.0)
MCH: 26.1 pg (ref 26.0–34.0)
MCHC: 32 g/dL (ref 30.0–36.0)
MCV: 81.4 fL (ref 78.0–100.0)
Monocytes Absolute: 0.2 10*3/uL (ref 0.1–1.0)
Monocytes Relative: 3 %
NEUTROS ABS: 3.8 10*3/uL (ref 1.7–7.7)
Neutrophils Relative %: 67 %
PLATELETS: 162 10*3/uL (ref 150–400)
RBC: 4.72 MIL/uL (ref 4.22–5.81)
RDW: 16 % — ABNORMAL HIGH (ref 11.5–15.5)
WBC: 5.8 10*3/uL (ref 4.0–10.5)

## 2017-06-06 LAB — COMPREHENSIVE METABOLIC PANEL
ALT: 12 U/L — AB (ref 17–63)
ANION GAP: 7 (ref 5–15)
AST: 26 U/L (ref 15–41)
Albumin: 3.5 g/dL (ref 3.5–5.0)
Alkaline Phosphatase: 44 U/L (ref 38–126)
BUN: 10 mg/dL (ref 6–20)
CHLORIDE: 109 mmol/L (ref 101–111)
CO2: 27 mmol/L (ref 22–32)
CREATININE: 1.19 mg/dL (ref 0.61–1.24)
Calcium: 9.1 mg/dL (ref 8.9–10.3)
GFR calc non Af Amer: >60 mL/min (ref >60)
Glucose, Bld: 111 mg/dL — ABNORMAL HIGH (ref 65–99)
POTASSIUM: 4.1 mmol/L (ref 3.5–5.1)
SODIUM: 143 mmol/L (ref 135–145)
Total Bilirubin: 0.5 mg/dL (ref 0.3–1.2)
Total Protein: 6 g/dL — ABNORMAL LOW (ref 6.5–8.1)

## 2017-06-06 LAB — I-STAT TROPONIN, ED: Troponin i, poc: 0.15 ng/mL (ref 0.00–0.08)

## 2017-06-06 LAB — TSH: TSH: 0.728 u[IU]/mL (ref 0.350–4.500)

## 2017-06-06 MED ORDER — IOPAMIDOL (ISOVUE-370) INJECTION 76%
INTRAVENOUS | Status: AC
Start: 2017-06-06 — End: 2017-06-07
  Administered 2017-06-07: 100 mL
  Filled 2017-06-06: qty 100

## 2017-06-06 MED ORDER — MORPHINE SULFATE (PF) 4 MG/ML IV SOLN
4.0000 mg | Freq: Once | INTRAVENOUS | Status: AC
  Administered 2017-06-06: 4 mg via INTRAVENOUS
  Filled 2017-06-06: qty 1

## 2017-06-06 NOTE — ED Notes (Signed)
Patient transported to CT 

## 2017-06-06 NOTE — ED Triage Notes (Signed)
Pt arrives [facility] from home with reports of CP 10/10. Pt with Zoll life vest on and unsure if shocking him. Pt given 325mg  aspirin, 2 sublingual Nitroglycerin, 5mg  morphine and 400cc NS prior to arrival. Pt with hx of 7 MI's. VSS on arrival.
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
## 2017-06-06 NOTE — ED Provider Notes (Signed)
Patient has vague historian comPlains of anterior chest pain onset 12 noon today pain migrates from one spot on his chest to another. Does not feel like "heart attacks" he's had in the past. Nothing makes symptoms better or worse.treated by EMS with aspirin and 2 sublingual nitroglycerin 5 mg of morphine and saline 400 mL intravenously prior to arrival He denies shortness of breath. On exam alert Glasgow Coma Score 15. No distress lungs clear auscultation heart regular rate and rhythm abdomen nondistended nontender neurologic cranial nerves II through XII grossly intact. Moves all extremity as well. I have not seen any myoclonic jerks or seizure activity.   Orlie Dakin, MD 06/06/17 2320

## 2017-06-07 ENCOUNTER — Inpatient Hospital Stay (HOSPITAL_COMMUNITY): Payer: Medicaid Other

## 2017-06-07 ENCOUNTER — Encounter (HOSPITAL_COMMUNITY): Payer: Self-pay

## 2017-06-07 DIAGNOSIS — I214 Non-ST elevation (NSTEMI) myocardial infarction: Secondary | ICD-10-CM | POA: Diagnosis not present

## 2017-06-07 DIAGNOSIS — E7800 Pure hypercholesterolemia, unspecified: Secondary | ICD-10-CM

## 2017-06-07 DIAGNOSIS — I255 Ischemic cardiomyopathy: Secondary | ICD-10-CM

## 2017-06-07 DIAGNOSIS — I25110 Atherosclerotic heart disease of native coronary artery with unstable angina pectoris: Principal | ICD-10-CM

## 2017-06-07 DIAGNOSIS — R079 Chest pain, unspecified: Secondary | ICD-10-CM | POA: Diagnosis not present

## 2017-06-07 DIAGNOSIS — I252 Old myocardial infarction: Secondary | ICD-10-CM | POA: Diagnosis not present

## 2017-06-07 DIAGNOSIS — Z8249 Family history of ischemic heart disease and other diseases of the circulatory system: Secondary | ICD-10-CM | POA: Diagnosis not present

## 2017-06-07 DIAGNOSIS — I714 Abdominal aortic aneurysm, without rupture: Secondary | ICD-10-CM | POA: Diagnosis present

## 2017-06-07 DIAGNOSIS — R791 Abnormal coagulation profile: Secondary | ICD-10-CM | POA: Diagnosis present

## 2017-06-07 DIAGNOSIS — R748 Abnormal levels of other serum enzymes: Secondary | ICD-10-CM | POA: Diagnosis not present

## 2017-06-07 DIAGNOSIS — I5042 Chronic combined systolic (congestive) and diastolic (congestive) heart failure: Secondary | ICD-10-CM

## 2017-06-07 DIAGNOSIS — E785 Hyperlipidemia, unspecified: Secondary | ICD-10-CM | POA: Diagnosis present

## 2017-06-07 DIAGNOSIS — Z7901 Long term (current) use of anticoagulants: Secondary | ICD-10-CM | POA: Diagnosis not present

## 2017-06-07 DIAGNOSIS — I10 Essential (primary) hypertension: Secondary | ICD-10-CM | POA: Diagnosis not present

## 2017-06-07 DIAGNOSIS — Z87891 Personal history of nicotine dependence: Secondary | ICD-10-CM | POA: Diagnosis not present

## 2017-06-07 DIAGNOSIS — K219 Gastro-esophageal reflux disease without esophagitis: Secondary | ICD-10-CM | POA: Diagnosis present

## 2017-06-07 DIAGNOSIS — I236 Thrombosis of atrium, auricular appendage, and ventricle as current complications following acute myocardial infarction: Secondary | ICD-10-CM | POA: Diagnosis not present

## 2017-06-07 DIAGNOSIS — Z9889 Other specified postprocedural states: Secondary | ICD-10-CM | POA: Diagnosis not present

## 2017-06-07 DIAGNOSIS — I340 Nonrheumatic mitral (valve) insufficiency: Secondary | ICD-10-CM | POA: Diagnosis not present

## 2017-06-07 DIAGNOSIS — J449 Chronic obstructive pulmonary disease, unspecified: Secondary | ICD-10-CM | POA: Diagnosis present

## 2017-06-07 DIAGNOSIS — G253 Myoclonus: Secondary | ICD-10-CM | POA: Diagnosis present

## 2017-06-07 DIAGNOSIS — I513 Intracardiac thrombosis, not elsewhere classified: Secondary | ICD-10-CM | POA: Diagnosis present

## 2017-06-07 DIAGNOSIS — Z955 Presence of coronary angioplasty implant and graft: Secondary | ICD-10-CM | POA: Diagnosis not present

## 2017-06-07 DIAGNOSIS — I5023 Acute on chronic systolic (congestive) heart failure: Secondary | ICD-10-CM | POA: Diagnosis not present

## 2017-06-07 DIAGNOSIS — Z8673 Personal history of transient ischemic attack (TIA), and cerebral infarction without residual deficits: Secondary | ICD-10-CM | POA: Diagnosis not present

## 2017-06-07 DIAGNOSIS — Z79899 Other long term (current) drug therapy: Secondary | ICD-10-CM | POA: Diagnosis not present

## 2017-06-07 DIAGNOSIS — G8929 Other chronic pain: Secondary | ICD-10-CM | POA: Diagnosis present

## 2017-06-07 DIAGNOSIS — I259 Chronic ischemic heart disease, unspecified: Secondary | ICD-10-CM | POA: Diagnosis not present

## 2017-06-07 DIAGNOSIS — Z823 Family history of stroke: Secondary | ICD-10-CM | POA: Diagnosis not present

## 2017-06-07 DIAGNOSIS — I110 Hypertensive heart disease with heart failure: Secondary | ICD-10-CM | POA: Diagnosis present

## 2017-06-07 DIAGNOSIS — G4733 Obstructive sleep apnea (adult) (pediatric): Secondary | ICD-10-CM | POA: Diagnosis present

## 2017-06-07 DIAGNOSIS — E118 Type 2 diabetes mellitus with unspecified complications: Secondary | ICD-10-CM

## 2017-06-07 DIAGNOSIS — E1140 Type 2 diabetes mellitus with diabetic neuropathy, unspecified: Secondary | ICD-10-CM | POA: Diagnosis present

## 2017-06-07 DIAGNOSIS — I2129 ST elevation (STEMI) myocardial infarction involving other sites: Secondary | ICD-10-CM | POA: Diagnosis not present

## 2017-06-07 LAB — ECHOCARDIOGRAM COMPLETE
HEIGHTINCHES: 66 in
WEIGHTICAEL: 2515.2 [oz_av]

## 2017-06-07 LAB — TROPONIN I
TROPONIN I: 0.14 ng/mL — AB (ref <0.03)
TROPONIN I: 0.13 ng/mL — AB (ref <0.03)
TROPONIN I: 0.09 ng/mL — AB (ref <0.03)

## 2017-06-07 LAB — LIPID PANEL
CHOL/HDL RATIO: 3.2 ratio
Cholesterol: 180 mg/dL (ref 0–200)
HDL: 56 mg/dL (ref >40)
LDL Cholesterol: 112 mg/dL — ABNORMAL HIGH (ref 0–99)
Triglycerides: 58 mg/dL (ref <150)
VLDL: 12 mg/dL (ref 0–40)

## 2017-06-07 LAB — GLUCOSE, CAPILLARY
GLUCOSE-CAPILLARY: 70 mg/dL (ref 65–99)
GLUCOSE-CAPILLARY: 89 mg/dL (ref 65–99)
Glucose-Capillary: 159 mg/dL — ABNORMAL HIGH (ref 65–99)
Glucose-Capillary: 97 mg/dL (ref 65–99)

## 2017-06-07 LAB — HEMOGLOBIN A1C
HEMOGLOBIN A1C: 6.4 % — AB (ref 4.8–5.6)
MEAN PLASMA GLUCOSE: 136.98 mg/dL

## 2017-06-07 LAB — DIGOXIN LEVEL: Digoxin Level: 0.2 ng/mL — ABNORMAL LOW (ref 0.8–2.0)

## 2017-06-07 LAB — BRAIN NATRIURETIC PEPTIDE: B NATRIURETIC PEPTIDE 5: 956.1 pg/mL — AB (ref 0.0–100.0)

## 2017-06-07 LAB — PROTIME-INR
INR: 2.24
PROTHROMBIN TIME: 25.1 s — AB (ref 11.4–15.2)

## 2017-06-07 LAB — I-STAT TROPONIN, ED: TROPONIN I, POC: 0.14 ng/mL — AB (ref 0.00–0.08)

## 2017-06-07 LAB — RAPID URINE DRUG SCREEN, HOSP PERFORMED
Amphetamines: NOT DETECTED
BARBITURATES: NOT DETECTED
Benzodiazepines: NOT DETECTED
COCAINE: NOT DETECTED
Opiates: POSITIVE — AB
TETRAHYDROCANNABINOL: NOT DETECTED

## 2017-06-07 MED ORDER — DICLOFENAC SODIUM 1 % TD GEL
2.0000 g | Freq: Four times a day (QID) | TRANSDERMAL | Status: DC
Start: 2017-06-07 — End: 2017-06-07

## 2017-06-07 MED ORDER — CLOPIDOGREL BISULFATE 75 MG PO TABS
75.0000 mg | ORAL_TABLET | Freq: Every day | ORAL | Status: DC
  Administered 2017-06-07 – 2017-06-11 (×5): 75 mg via ORAL
  Filled 2017-06-07 (×5): qty 1

## 2017-06-07 MED ORDER — FUROSEMIDE 40 MG PO TABS
40.0000 mg | ORAL_TABLET | Freq: Every day | ORAL | Status: DC
  Administered 2017-06-07 – 2017-06-10 (×4): 40 mg via ORAL
  Filled 2017-06-07 (×4): qty 1

## 2017-06-07 MED ORDER — NITROGLYCERIN 0.4 MG SL SUBL: 0.4000 mg | SUBLINGUAL_TABLET | SUBLINGUAL | Status: DC | PRN

## 2017-06-07 MED ORDER — DIGOXIN 125 MCG PO TABS
0.1250 mg | ORAL_TABLET | Freq: Every day | ORAL | Status: DC
  Administered 2017-06-07 – 2017-06-11 (×5): 0.125 mg via ORAL
  Filled 2017-06-07 (×6): qty 1

## 2017-06-07 MED ORDER — ALPRAZOLAM 0.25 MG PO TABS
0.2500 mg | ORAL_TABLET | Freq: Two times a day (BID) | ORAL | Status: DC | PRN
  Administered 2017-06-08: 0.25 mg via ORAL
  Filled 2017-06-07: qty 1

## 2017-06-07 MED ORDER — LOSARTAN POTASSIUM 25 MG PO TABS
12.5000 mg | ORAL_TABLET | Freq: Every day | ORAL | Status: DC
  Administered 2017-06-07 – 2017-06-11 (×5): 12.5 mg via ORAL
  Filled 2017-06-07 (×5): qty 1

## 2017-06-07 MED ORDER — PERFLUTREN LIPID MICROSPHERE
1.0000 mL | INTRAVENOUS | Status: AC | PRN
  Administered 2017-06-07: 2 mL via INTRAVENOUS
  Filled 2017-06-07: qty 10

## 2017-06-07 MED ORDER — MORPHINE SULFATE (PF) 4 MG/ML IV SOLN
2.0000 mg | INTRAVENOUS | Status: DC | PRN
  Administered 2017-06-07: 2 mg via INTRAVENOUS
  Filled 2017-06-07: qty 1

## 2017-06-07 MED ORDER — KETOROLAC TROMETHAMINE 30 MG/ML IJ SOLN
30.0000 mg | Freq: Four times a day (QID) | INTRAMUSCULAR | Status: DC
  Administered 2017-06-07 – 2017-06-10 (×12): 30 mg via INTRAVENOUS
  Filled 2017-06-07 (×12): qty 1

## 2017-06-07 MED ORDER — EZETIMIBE 10 MG PO TABS
10.0000 mg | ORAL_TABLET | Freq: Every day | ORAL | Status: DC
  Administered 2017-06-07 – 2017-06-11 (×5): 10 mg via ORAL
  Filled 2017-06-07 (×5): qty 1

## 2017-06-07 MED ORDER — DICLOFENAC SODIUM 1 % TD GEL
4.0000 g | Freq: Four times a day (QID) | TRANSDERMAL | Status: DC
  Administered 2017-06-07 – 2017-06-11 (×13): 4 g via TOPICAL
  Filled 2017-06-07: qty 100

## 2017-06-07 MED ORDER — ONDANSETRON HCL 4 MG/2ML IJ SOLN: 4.0000 mg | Freq: Four times a day (QID) | INTRAMUSCULAR | Status: DC | PRN

## 2017-06-07 MED ORDER — ACETAMINOPHEN 325 MG PO TABS: 650.0000 mg | ORAL_TABLET | ORAL | Status: DC | PRN

## 2017-06-07 MED ORDER — ZOLPIDEM TARTRATE 5 MG PO TABS
5.0000 mg | ORAL_TABLET | Freq: Every evening | ORAL | Status: DC | PRN
  Administered 2017-06-09: 5 mg via ORAL
  Filled 2017-06-07: qty 1

## 2017-06-07 MED ORDER — CARVEDILOL 3.125 MG PO TABS
3.1250 mg | ORAL_TABLET | Freq: Two times a day (BID) | ORAL | Status: DC
  Administered 2017-06-07 – 2017-06-11 (×9): 3.125 mg via ORAL
  Filled 2017-06-07 (×9): qty 1

## 2017-06-07 MED ORDER — MORPHINE SULFATE (PF) 4 MG/ML IV SOLN: 4.0000 mg | Freq: Once | INTRAVENOUS | Status: DC

## 2017-06-07 MED ORDER — SPIRONOLACTONE 25 MG PO TABS
12.5000 mg | ORAL_TABLET | Freq: Every evening | ORAL | Status: DC
  Administered 2017-06-07 – 2017-06-10 (×4): 12.5 mg via ORAL
  Filled 2017-06-07 (×5): qty 1

## 2017-06-07 MED ORDER — INSULIN ASPART 100 UNIT/ML ~~LOC~~ SOLN
0.0000 [IU] | Freq: Three times a day (TID) | SUBCUTANEOUS | Status: DC
  Administered 2017-06-07: 2 [IU] via SUBCUTANEOUS
  Administered 2017-06-09: 3 [IU] via SUBCUTANEOUS
  Administered 2017-06-09: 1 [IU] via SUBCUTANEOUS

## 2017-06-07 MED ORDER — HYDRALAZINE HCL 20 MG/ML IJ SOLN: 5.0000 mg | INTRAMUSCULAR | Status: DC | PRN

## 2017-06-07 MED ORDER — NICOTINE 21 MG/24HR TD PT24: 21.0000 mg | MEDICATED_PATCH | Freq: Every day | TRANSDERMAL | Status: DC

## 2017-06-07 MED ORDER — INSULIN ASPART 100 UNIT/ML ~~LOC~~ SOLN: 0.0000 [IU] | Freq: Every day | SUBCUTANEOUS | Status: DC

## 2017-06-07 MED ORDER — GABAPENTIN 100 MG PO CAPS
100.0000 mg | ORAL_CAPSULE | Freq: Two times a day (BID) | ORAL | Status: DC
  Administered 2017-06-08 – 2017-06-11 (×7): 100 mg via ORAL
  Filled 2017-06-07 (×7): qty 1

## 2017-06-07 NOTE — Progress Notes (Signed)
Pt is scheduled for left heart cath on Monday 06/10/17 in the afternoon - may possibly go earlier. NPO at [facility] Sunday night. INR check Monday morning.   Tami Lin Evalin Shawhan, PA-C 06/07/2017, 12:40 PM [phone]
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
## 2017-06-07 NOTE — Progress Notes (Signed)
ANTICOAGULATION CONSULT NOTE - Follow Up Consult  Pharmacy Consult for Heparin when INR < 2 Indication: chest pain/ACS and LV thrombus  Allergies  Allergen Reactions  . Pork-Derived Products Other (See Comments)    Does not eat for religious reasons - okay with using IV heparin    Patient Measurements: Height: 5\' 6"  (167.6 cm) Weight: 157 lb 3.2 oz (71.3 kg) IBW/kg (Calculated) : 63.8 Heparin Dosing Weight: 72  Vital Signs: Temp: 98 F (36.7 C) (08/10 0828) Temp Source: Oral (08/10 0828) BP: 115/66 (08/10 0828) Pulse Rate: 71 (08/10 0828)  Labs:  Recent Labs  06/06/17 2112 06/07/17 0425 06/07/17 0731  HGB 12.3*  --   --   HCT 38.4*  --   --   PLT 162  --   --   LABPROT  --  25.1*  --   INR  --  2.24  --   CREATININE 1.19  --   --   TROPONINI  --  0.14* 0.13*    Estimated Creatinine Clearance: 59.6 mL/min (by C-G formula based on SCr of 1.19 mg/dL).   Medications:  Scheduled:  . carvedilol  3.125 mg Oral BID WC  . clopidogrel  75 mg Oral Daily  . diclofenac sodium  4 g Topical QID  . digoxin  0.125 mg Oral Daily  . ezetimibe  10 mg Oral Daily  . furosemide  40 mg Oral Daily  . [START ON 06/08/2017] gabapentin  100 mg Oral BID  . insulin aspart  0-5 Units Subcutaneous QHS  . insulin aspart  0-9 Units Subcutaneous TID WC  . ketorolac  30 mg Intravenous Q6H  . losartan  12.5 mg Oral Daily  . spironolactone  12.5 mg Oral QPM    Assessment: 61yo male presenting with chest pain this AM with extensive cardiac hx on Coumadin pta for LV thrombus, to start heparin bridge when INR < 2.  Pt has not received Vitamin K to reverse INR.  Per d/w pt, he did receive his Coumadin dose on 8/9.  INR was therapeutic this AM.  Goal of Therapy:  Heparin level 0.3-0.7 units/ml Monitor platelets by anticoagulation protocol: Yes   Plan:  Daily INR Start heparin when INR < 2   Gracy Bruins, B.S., PharmD [facility]
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
## 2017-06-07 NOTE — Clinical Social Work Note (Signed)
Clinical Social Work Assessment  Patient Details  Name: Daniel Li MRN: [id number] Date of Birth: Mar 14, 1956  Date of referral:  06/07/17               Reason for consult:  Discharge Planning, Facility Placement                Permission sought to share information with:  Facility Sport and exercise psychologist, Family Supports Permission granted to share information::  No  Name::     Daniel Li  Agency::  [facility]  Relationship::  sister  Contact Information:  [phone]  Housing/Transportation Living arrangements for the past 2 months:  [place], [place] (just moved to ALF 8/2) Source of Information:  Other (Comment Required) (sister) Patient Interpreter Needed:  None Criminal Activity/Legal Involvement Pertinent to Current Situation/Hospitalization:  No - Comment as needed Significant Relationships:  Siblings Lives with:  Facility Resident Do you feel safe going back to the place where you live?  Yes Need for family participation in patient care:  No (Coment)  Care giving concerns: Patient from [facility]F in [place]. Not assessed by PT yet.   Social Worker assessment / plan: CSW attempted to meet with patient at bedside but patient was asleep. CSW called patient's sister and sister confirmed patient from ALF and plan is to return. Patient lived at home until 8/2 when he moved to ALF. Sister very involved in patient's care and has all patient's doctor's information and appointments. CSW to continue to follow and support with discharg back to ALF when ready.  Employment status:  Retired Forensic scientist:  Medicaid In [place] PT Recommendations:  Not assessed at this time Information / Referral to community resources:  Other (Comment Required) (back to ALF)  Patient/Family's Response to care: Patient's sister appreciative of care.  Patient/Family's Understanding of and Emotional Response to Diagnosis, Current Treatment,  and Prognosis: Patient's sister with good understanding of patient's condition and hopeful for patient to return to ALF.   Emotional Assessment Appearance:  Appears stated age Attitude/Demeanor/Rapport:  Unable to Assess (patient asleep) Affect (typically observed):  Unable to Assess (patient asleep) Orientation:  Oriented to Self, Oriented to Place, Oriented to  Time, Oriented to Situation (WDL, but unable to assess firsthand because pt asleep) Alcohol / Substance use:  Not Applicable Psych involvement (Current and /or in the community):  No (Comment)  Discharge Needs  Concerns to be addressed:  Discharge Planning Concerns Readmission within the last 30 days:  No Current discharge risk:  Physical Impairment Barriers to Discharge:  Continued Medical Work up   Daniel Emms, LCSW 06/07/2017, 4:49 PM

## 2017-06-07 NOTE — Progress Notes (Signed)
  Echocardiogram 2D Echocardiogram with Definity has been performed.  Tresa Res 06/07/2017, 11:38 AM

## 2017-06-07 NOTE — Progress Notes (Signed)
PROGRESS NOTE    Daniel Li   JJK:[id number]  DOB: 1956-04-27  DOA: 06/06/2017 PCP: Rosita Fire, MD   Brief Narrative:  Daniel Li a 61 y.o. male with a hx of CAD with NSTEMI in 2007, STEMI in 2013, NSTEMI in 2014, STEMI in 4/18 ,   ischemic cardiomyopathy with EF of 20% and Life Vest, Grade 2dCHF, DM, LV thrombus on Coumadin, HTN, HLD, tobacco abuse, OSA on CPAP. Sent home in April on ASA, Plavix and Coumadin.   Subjective: Ongoing chest pain for at least 1-2 wks now  ROS: no complaints of nausea, vomiting, constipation diarrhea, cough, dyspnea or dysuria. No other complaints.   Assessment & Plan:   Principal Problem:   Chest pain - Morphine helps, nitro did not - Trop 0.14 - R shoulder tenderness and musculoskeletal chest component >> Voltaren gel, Toradol and heating pad - cardiology plans on cath on Monday as INR is 2.24 today  Active Problems:   Diabetes mellitus type 2 with neuropathy - Last A1c 6.7 on 02/22/15, well controled. 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 (HCC) - Coumadin- Heparin when INR comes down  DVT prophylaxis: Coumadin Code Status: Full code Family Communication:  Disposition Plan:  Consultants:   cardiology Procedures:    Antimicrobials:  Anti-infectives    None       Objective: Vitals:   06/07/17 0300 06/07/17 0344 06/07/17 0828 06/07/17 1209  BP: (!) 115/91 (!) 116/91 115/66 102/71  Pulse: 76  71 81  Resp:  16    Temp:  97.6 F (36.4 C) 98 F (36.7 C) 97.9 F (36.6 C)  TempSrc:  Oral Oral Oral  SpO2: 93% 95% 92% 100%  Weight:  71.3 kg (157 lb 3.2  oz)    Height:  5\' 6"  (1.676 m)     No intake or output data in the 24 hours ending 06/07/17 1247 Filed Weights   06/07/17 0344  Weight: 71.3 kg (157 lb 3.2 oz)    Examination: General exam: Appears comfortable  HEENT: PERRLA, oral mucosa moist, no sclera icterus or thrush Respiratory system: Clear to auscultation. Respiratory effort normal. Cardiovascular system: S1 & S2 heard, RRR.  No murmurs  Chest: very tender across chest  Gastrointestinal system: Abdomen soft, non-tender, nondistended. Normal bowel sound. No organomegaly Central nervous system: Alert and oriented. No focal neurological deficits. Extremities: No cyanosis, clubbing or edema Skin: No rashes or ulcers Psychiatry:  Mood & affect appropriate.     Data Reviewed: I have personally reviewed following labs and imaging studies  CBC:  Recent Labs Lab 06/06/17 2112  WBC 5.8  NEUTROABS 3.8  HGB 12.3*  HCT 38.4*  MCV 81.4  PLT 371   Basic Metabolic Panel:  Recent Labs Lab 06/06/17 2112  NA 143  K 4.1  CL 109  CO2 27  GLUCOSE 111*  BUN 10  CREATININE 1.19  CALCIUM 9.1   GFR: Estimated Creatinine Clearance: 59.6 mL/min (by C-G formula based on SCr of 1.19 mg/dL). Liver Function Tests:  Recent Labs Lab 06/06/17 2112  AST 26  ALT 12*  ALKPHOS 44  BILITOT 0.5  PROT 6.0*  ALBUMIN 3.5   No results for input(s): LIPASE, AMYLASE in the last 168 hours. No results for input(s): AMMONIA in the last 168 hours. Coagulation Profile:  Recent Labs Lab 06/07/17 0425  INR 2.24   Cardiac Enzymes:  Recent Labs Lab 06/07/17 0425 06/07/17 0731  TROPONINI 0.14* 0.13*   BNP (last 3 results) No results for input(s): PROBNP in the last 8760 hours. HbA1C:  Recent Labs  06/07/17 0425  HGBA1C 6.4*   CBG:  Recent Labs Lab 06/07/17 0724 06/07/17 1128  GLUCAP 70 89   Lipid Profile:  Recent Labs  06/07/17 0425  CHOL 180  HDL 56  LDLCALC 112*  TRIG 58  CHOLHDL 3.2   Thyroid Function  Tests:  Recent Labs  06/06/17 2137  TSH 0.728   Anemia Panel: No results for input(s): VITAMINB12, FOLATE, FERRITIN, TIBC, IRON, RETICCTPCT in the last 72 hours. Urine analysis: No results found for: COLORURINE, APPEARANCEUR, LABSPEC, PHURINE, GLUCOSEU, HGBUR, BILIRUBINUR, KETONESUR, PROTEINUR, UROBILINOGEN, NITRITE, LEUKOCYTESUR Sepsis Labs: @LABRCNTIP (procalcitonin:4,lacticidven:4) )No results found for this or any previous visit (from the past 240 hour(s)).       Radiology Studies: Dg Chest 2 View  Result Date: 06/06/2017 CLINICAL DATA:  Acute chest pain today. EXAM: CHEST  2 VIEW COMPARISON:  05/18/2017 and prior radiographs FINDINGS: Cardiomegaly noted. There is no evidence of focal airspace disease, pulmonary edema, suspicious pulmonary nodule/mass, pleural effusion, or pneumothorax. No acute bony abnormalities are identified. IMPRESSION: Cardiomegaly without evidence of acute cardiopulmonary disease. Electronically Signed   By: Margarette Canada M.D.   On: 06/06/2017 21:33   Ct Angio Chest/abd/pel For Dissection W And/or Wo Contrast  Result Date: 06/07/2017 CLINICAL DATA:  Sharp chest pain radiating to the back this evening. EXAM: CT ANGIOGRAPHY CHEST, ABDOMEN AND PELVIS TECHNIQUE: Multidetector CT imaging through the chest, abdomen and pelvis was performed using the standard protocol during bolus administration of intravenous contrast. Multiplanar reconstructed images and MIPs were obtained and reviewed to evaluate the vascular anatomy. CONTRAST:  100 cc Isovue 370 IV COMPARISON:  None. FINDINGS: CTA CHEST FINDINGS Cardiovascular: No aortic aneurysm or dissection. Three-vessel coronary arteriosclerosis. Cardiomegaly without pericardial effusion. No acute large central pulmonary embolus. Mediastinum/Nodes: No enlarged mediastinal, hilar, or axillary lymph nodes. Thyroid gland, trachea, and esophagus demonstrate no significant findings. Lungs/Pleura: Minimal centrilobular emphysema in the  right upper lobe. Bibasilar atelectasis. No effusion or pneumothorax. No pulmonary consolidation. Musculoskeletal: No chest wall abnormality. No acute or significant osseous findings. Degenerative disc disease and osteophytes along the thoracic spine. Review of the MIP images confirms the above findings. CTA ABDOMEN AND PELVIS FINDINGS VASCULAR Aorta: Mild infrarenal fusiform dilatation of the abdominal aorta extending to the bifurcation measuring 5.4 cm in craniocaudad dimension by 3.2 cm transverse. Soft plaque is seen along the left lateral wall with ulcerated plaque along its more cephalad margin, series 10, image 62 and series 7, image 165. Celiac: Patent without evidence of aneurysm, dissection, vasculitis or significant stenosis. SMA: Patent without evidence of aneurysm, dissection, vasculitis or significant stenosis. Renals: Single renal arteries bilaterally with calcification at the left renal artery origin but without significant stenosis and minimal calcification of the proximal right renal artery. IMA: Patent Inflow: Ectasia of the right common iliac artery to 2 cm and on the left, 1.5 cm. No focal aneurysm or thrombosis. Mild atherosclerosis of internal and external iliacs. Veins: No obvious venous abnormality within the limitations of this arterial phase study. Review of the MIP images confirms the above findings. NON-VASCULAR Hepatobiliary: No focal liver abnormality  is seen. No gallstones, gallbladder wall thickening, or biliary dilatation. Pancreas: Unremarkable. No pancreatic ductal dilatation or surrounding inflammatory changes. Spleen: Normal in size without focal abnormality. Adrenals/Urinary Tract: Mildly thickened appearing adrenal glands bilaterally which may represent hyperplasia. Cortical thinning and scarring of the lower pole the right kidney laterally. No obstructive uropathy or nephrolithiasis. No definite enhancing mass. The urinary bladder is physiologically distended. Stomach/Bowel:  Contracted stomach with normal small bowel rotation. Moderate stool burden within large bowel. Normal appendix. No bowel inflammation or obstruction. Lymphatic: No adenopathy. Reproductive: Normal size prostate. Other: No free air nor free fluid. Musculoskeletal: Degenerative disc disease with vacuum disc phenomenon L3-4 and L4-5. There is a moderate disc bulge at L3-4. Mild dextroconvex curvature the mid lumbar spine. Multilevel degenerative facet arthropathy. Review of the MIP images confirms the above findings. IMPRESSION: 1. No thoracic aortic dissection. 2. 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 3. Ectasia of the common iliac arteries. 4. Degenerative disc disease with vacuum disc phenomenon at L3-4 and L4-5 with moderate-sized broad-based disc bulge at L3-4. 5. Right renal cortical scarring in the lower pole. Electronically Signed   By: Ashley Royalty M.D.   On: 06/07/2017 01:00      Scheduled Meds: . carvedilol  3.125 mg Oral BID WC  . clopidogrel  75 mg Oral Daily  . diclofenac sodium  4 g Topical QID  . digoxin  0.125 mg Oral Daily  . ezetimibe  10 mg Oral Daily  . furosemide  40 mg Oral Daily  . [START ON 06/08/2017] gabapentin  100 mg Oral BID  . insulin aspart  0-5 Units Subcutaneous QHS  . insulin aspart  0-9 Units Subcutaneous TID WC  . ketorolac  30 mg Intravenous Q6H  . losartan  12.5 mg Oral Daily  . spironolactone  12.5 mg Oral QPM   Continuous Infusions:   LOS: 0 days    Time spent in minutes: 35    Debbe Odea, MD [facility] Pager: [email or web address] Password [facility] 06/07/2017, 12:47 PM

## 2017-06-08 DIAGNOSIS — I259 Chronic ischemic heart disease, unspecified: Secondary | ICD-10-CM

## 2017-06-08 DIAGNOSIS — I10 Essential (primary) hypertension: Secondary | ICD-10-CM

## 2017-06-08 DIAGNOSIS — I2129 ST elevation (STEMI) myocardial infarction involving other sites: Secondary | ICD-10-CM

## 2017-06-08 LAB — PROTIME-INR
INR: 2.09
PROTHROMBIN TIME: 23.8 s — AB (ref 11.4–15.2)

## 2017-06-08 LAB — GLUCOSE, CAPILLARY
GLUCOSE-CAPILLARY: 114 mg/dL — AB (ref 65–99)
GLUCOSE-CAPILLARY: 93 mg/dL (ref 65–99)
Glucose-Capillary: 97 mg/dL (ref 65–99)
Glucose-Capillary: 140 mg/dL — ABNORMAL HIGH (ref 65–99)

## 2017-06-08 MED ORDER — HEPARIN (PORCINE) IN NACL 100-0.45 UNIT/ML-% IJ SOLN
1250.0000 [IU]/h | INTRAMUSCULAR | Status: DC
  Administered 2017-06-08: 1100 [IU]/h via INTRAVENOUS
  Filled 2017-06-08 (×2): qty 250

## 2017-06-08 NOTE — Progress Notes (Signed)
ANTICOAGULATION CONSULT NOTE - Follow Up Consult  Pharmacy Consult for Heparin when INR < 2 Indication: chest pain/ACS and LV thrombus  Allergies  Allergen Reactions  . Pork-Derived Products Other (See Comments)    Does not eat for religious reasons - okay with using IV heparin    Patient Measurements: Height: 5\' 6"  (167.6 cm) Weight: 155 lb 8 oz (70.5 kg) IBW/kg (Calculated) : 63.8 Heparin Dosing Weight: 72  Vital Signs: Temp: 98.3 F (36.8 C) (08/11 1108) Temp Source: Oral (08/11 1108) BP: 110/77 (08/11 1108) Pulse Rate: 75 (08/11 0448)  Labs:  Recent Labs  06/06/17 2112 06/07/17 0425 06/07/17 0731 06/07/17 1425 06/08/17 0320  HGB 12.3*  --   --   --   --   HCT 38.4*  --   --   --   --   PLT 162  --   --   --   --   LABPROT  --  25.1*  --   --  23.8*  INR  --  2.24  --   --  2.09  CREATININE 1.19  --   --   --   --   TROPONINI  --  0.14* 0.13* 0.09*  --     Estimated Creatinine Clearance: 59.6 mL/min (by C-G formula based on SCr of 1.19 mg/dL).   Medications:  Scheduled:  . carvedilol  3.125 mg Oral BID WC  . clopidogrel  75 mg Oral Daily  . diclofenac sodium  4 g Topical QID  . digoxin  0.125 mg Oral Daily  . ezetimibe  10 mg Oral Daily  . furosemide  40 mg Oral Daily  . gabapentin  100 mg Oral BID  . insulin aspart  0-5 Units Subcutaneous QHS  . insulin aspart  0-9 Units Subcutaneous TID WC  . ketorolac  30 mg Intravenous Q6H  . losartan  12.5 mg Oral Daily  . spironolactone  12.5 mg Oral QPM    Assessment: 61yo male presenting with chest pain on 8/10 with extensive cardiac hx on Coumadin pta for LV thrombus, to start heparin bridge when INR < 2.  Pt has not received Vitamin K to reverse INR.  Per d/w pt, he did receive his Coumadin dose on 8/9.  INR was therapeutic this AM at 2.09.  Goal of Therapy:  Heparin level 0.3-0.7 units/ml Monitor platelets by anticoagulation protocol: Yes   Plan:  INR at 1800 today, then daily INR Start heparin when  INR < 2   Leroy Libman, PharmD Pharmacy Resident Pager: [phone]

## 2017-06-08 NOTE — Progress Notes (Signed)
Pt currently still wearing LifeVest while in hospital however per patient and patient's RN Marya Amsler, the battery is dying and patient doesn't have back up battery. Spoke with patient's sister, Mardene Celeste, regarding bringing a replacement battery but sister stated she has her own health issues and is unable to obtain battery at patient's ALF and bring here. Dr. Wynelle Cleveland aware of the above. Spoke with Ignacia Bayley, NP with cardiology who stated to remove lifevest from patient while in hospital, stated patient doesn't need to wear while in house. Call made to Bailey, Linus Galas, regarding battery replacement for patient prior to discharge. Waiting return phone call. Pt's RN Marya Amsler made aware of above, to update patient.

## 2017-06-08 NOTE — Progress Notes (Signed)
PROGRESS NOTE    Daniel Li   NFA:[id number]  DOB: November 13, 1955  DOA: 06/06/2017 PCP: Rosita Fire, MD   Brief Narrative:  Daniel Li a 61 y.o. male with a hx of CAD with NSTEMI in 2007, STEMI in 2013, NSTEMI in 2014, STEMI in 4/18 ,   ischemic cardiomyopathy with EF of 20% and Life Vest, Grade 2dCHF, DM, LV thrombus on Coumadin, HTN, HLD, tobacco abuse, OSA on CPAP. Sent home in April on ASA, Plavix and Coumadin.   Subjective: Chest pain and right shoulder pain is not as severe as yesterday. ROS: no complaints of nausea, vomiting, constipation diarrhea, cough, dyspnea or dysuria. No other complaints.   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 Patient is taking glipizide at home - ISS - Neurontin    Hypertension, essential -continue Coreg, losartan, Lasix and spironolactone for CHF -IV hydralazine when necessary   Chronic combined systolic (congestive) and diastolic (congestive) heart failure  - EF 20-25 % - cont Cozaar, Dig, Lasix and Aldactone - life vest- has run out of battery, trying to replace- cont telemetry    Hypercholesterolemia - Zetia     DVT prophylaxis: Coumadin Code Status: Full code Family Communication:  Disposition Plan:  Consultants:   cardiology Procedures:    Antimicrobials:  Anti-infectives    None       Objective: Vitals:   06/08/17 0448 06/08/17 0806 06/08/17 1007 06/08/17 1108  BP: 100/70 98/83 105/70 110/77  Pulse: 75     Resp:      Temp: 98.3 F (36.8 C) 98.5 F (36.9 C)  98.3 F (36.8 C)    TempSrc: Oral Oral  Oral  SpO2: 96% 97%  98%  Weight: 70.5 kg (155 lb 8 oz)     Height:       No intake or output data in the 24 hours ending 06/08/17 1159 Filed Weights   06/07/17 0344 06/08/17 0448  Weight: 71.3 kg (157 lb 3.2 oz) 70.5 kg (155 lb 8 oz)    Examination: General exam: Appears comfortable  HEENT: PERRLA, oral mucosa moist, no sclera icterus or thrush Respiratory system: Clear to auscultation. Respiratory effort normal. Chest: less tender across chest wall and right shoulder Cardiovascular system: S1 & S2 heard,  No murmurs  Gastrointestinal system: Abdomen soft, non-tender, nondistended. Normal bowel sound. No organomegaly Central nervous system: Alert and oriented. No focal neurological deficits. Extremities: No cyanosis, clubbing or edema Skin: No rashes or ulcers Psychiatry:  Mood & affect appropriate.     Data Reviewed: I have personally reviewed following labs and imaging studies  CBC:  Recent Labs Lab 06/06/17 2112  WBC 5.8  NEUTROABS 3.8  HGB 12.3*  HCT 38.4*  MCV 81.4  PLT 469   Basic Metabolic Panel:  Recent Labs Lab 06/06/17 2112  NA 143  K 4.1  CL 109  CO2 27  GLUCOSE 111*  BUN 10  CREATININE 1.19  CALCIUM 9.1   GFR: Estimated Creatinine Clearance: 59.6 mL/min (by C-G formula based on SCr of 1.19 mg/dL). Liver Function  Tests:  Recent Labs Lab 06/06/17 2112  AST 26  ALT 12*  ALKPHOS 44  BILITOT 0.5  PROT 6.0*  ALBUMIN 3.5   No results for input(s): LIPASE, AMYLASE in the last 168 hours. No results for input(s): AMMONIA in the last 168 hours. Coagulation Profile:  Recent Labs Lab 06/07/17 0425 06/08/17 0320  INR 2.24 2.09   Cardiac Enzymes:  Recent Labs Lab 06/07/17 0425 06/07/17 0731 06/07/17 1425  TROPONINI 0.14* 0.13* 0.09*   BNP (last 3 results) No results for input(s): PROBNP in the last 8760 hours. HbA1C:  Recent Labs  06/07/17 0425  HGBA1C 6.4*   CBG:  Recent Labs Lab 06/07/17 1128  06/07/17 1640 06/07/17 2137 06/08/17 0803 06/08/17 1106  GLUCAP 89 159* 97 97 140*   Lipid Profile:  Recent Labs  06/07/17 0425  CHOL 180  HDL 56  LDLCALC 112*  TRIG 58  CHOLHDL 3.2   Thyroid Function Tests:  Recent Labs  06/06/17 2137  TSH 0.728   Anemia Panel: No results for input(s): VITAMINB12, FOLATE, FERRITIN, TIBC, IRON, RETICCTPCT in the last 72 hours. Urine analysis: No results found for: COLORURINE, APPEARANCEUR, LABSPEC, PHURINE, GLUCOSEU, HGBUR, BILIRUBINUR, KETONESUR, PROTEINUR, UROBILINOGEN, NITRITE, LEUKOCYTESUR Sepsis Labs: @LABRCNTIP (procalcitonin:4,lacticidven:4) )No results found for this or any previous visit (from the past 240 hour(s)).       Radiology Studies: Dg Chest 2 View  Result Date: 06/06/2017 CLINICAL DATA:  Acute chest pain today. EXAM: CHEST  2 VIEW COMPARISON:  05/18/2017 and prior radiographs FINDINGS: Cardiomegaly noted. There is no evidence of focal airspace disease, pulmonary edema, suspicious pulmonary nodule/mass, pleural effusion, or pneumothorax. No acute bony abnormalities are identified. IMPRESSION: Cardiomegaly without evidence of acute cardiopulmonary disease. Electronically Signed   By: Margarette Canada M.D.   On: 06/06/2017 21:33   Ct Angio Chest/abd/pel For Dissection W And/or Wo Contrast  Result Date: 06/07/2017 CLINICAL DATA:  Sharp chest pain radiating to the back this evening. EXAM: CT ANGIOGRAPHY CHEST, ABDOMEN AND PELVIS TECHNIQUE: Multidetector CT imaging through the chest, abdomen and pelvis was performed using the standard protocol during bolus administration of intravenous contrast. Multiplanar reconstructed images and MIPs were obtained and reviewed to evaluate the vascular anatomy. CONTRAST:  100 cc Isovue 370 IV COMPARISON:  None. FINDINGS: CTA CHEST FINDINGS Cardiovascular: No aortic aneurysm or dissection. Three-vessel coronary arteriosclerosis. Cardiomegaly without pericardial effusion. No acute large central  pulmonary embolus. Mediastinum/Nodes: No enlarged mediastinal, hilar, or axillary lymph nodes. Thyroid gland, trachea, and esophagus demonstrate no significant findings. Lungs/Pleura: Minimal centrilobular emphysema in the right upper lobe. Bibasilar atelectasis. No effusion or pneumothorax. No pulmonary consolidation. Musculoskeletal: No chest wall abnormality. No acute or significant osseous findings. Degenerative disc disease and osteophytes along the thoracic spine. Review of the MIP images confirms the above findings. CTA ABDOMEN AND PELVIS FINDINGS VASCULAR Aorta: Mild infrarenal fusiform dilatation of the abdominal aorta extending to the bifurcation measuring 5.4 cm in craniocaudad dimension by 3.2 cm transverse. Soft plaque is seen along the left lateral wall with ulcerated plaque along its more cephalad margin, series 10, image 62 and series 7, image 165. Celiac: Patent without evidence of aneurysm, dissection, vasculitis or significant stenosis. SMA: Patent without evidence of aneurysm, dissection, vasculitis or significant stenosis. Renals: Single renal arteries bilaterally with calcification at the left renal artery origin but without significant stenosis and minimal calcification of the proximal right renal artery. IMA: Patent Inflow: Ectasia of the right common iliac artery to 2 cm and on the left, 1.5 cm. No focal  aneurysm or thrombosis. Mild atherosclerosis of internal and external iliacs. Veins: No obvious venous abnormality within the limitations of this arterial phase study. Review of the MIP images confirms the above findings. NON-VASCULAR Hepatobiliary: No focal liver abnormality is seen. No gallstones, gallbladder wall thickening, or biliary dilatation. Pancreas: Unremarkable. No pancreatic ductal dilatation or surrounding inflammatory changes. Spleen: Normal in size without focal abnormality. Adrenals/Urinary Tract: Mildly thickened appearing adrenal glands bilaterally which may represent  hyperplasia. Cortical thinning and scarring of the lower pole the right kidney laterally. No obstructive uropathy or nephrolithiasis. No definite enhancing mass. The urinary bladder is physiologically distended. Stomach/Bowel: Contracted stomach with normal small bowel rotation. Moderate stool burden within large bowel. Normal appendix. No bowel inflammation or obstruction. Lymphatic: No adenopathy. Reproductive: Normal size prostate. Other: No free air nor free fluid. Musculoskeletal: Degenerative disc disease with vacuum disc phenomenon L3-4 and L4-5. There is a moderate disc bulge at L3-4. Mild dextroconvex curvature the mid lumbar spine. Multilevel degenerative facet arthropathy. Review of the MIP images confirms the above findings. IMPRESSION: 1. No thoracic aortic dissection. 2. 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 3. Ectasia of the common iliac arteries. 4. Degenerative disc disease with vacuum disc phenomenon at L3-4 and L4-5 with moderate-sized broad-based disc bulge at L3-4. 5. Right renal cortical scarring in the lower pole. Electronically Signed   By: Ashley Royalty M.D.   On: 06/07/2017 01:00      Scheduled Meds: . carvedilol  3.125 mg Oral BID WC  . clopidogrel  75 mg Oral Daily  . diclofenac sodium  4 g Topical QID  . digoxin  0.125 mg Oral Daily  . ezetimibe  10 mg Oral Daily  . furosemide  40 mg Oral Daily  . gabapentin  100 mg Oral BID  . insulin aspart  0-5 Units Subcutaneous QHS  . insulin aspart  0-9 Units Subcutaneous TID WC  . ketorolac  30 mg Intravenous Q6H  . losartan  12.5 mg Oral Daily  . spironolactone  12.5 mg Oral QPM   Continuous Infusions:   LOS: 1 day    Time spent in minutes: [place], MD [facility] Pager: [email or web address] Password [email or web address] 06/08/2017, 11:59 AM

## 2017-06-08 NOTE — Progress Notes (Signed)
Subjective:  He complains of some numbness of the left side of his jaw.  States the arm jerking is better and complains of very mild chest pain today.  Objective:  Vital Signs in the last 24 hours: BP 110/77 (BP Location: Right Arm)   Pulse 75   Temp 98.3 F (36.8 C) (Oral)   Resp 16   Ht 5\' 6"  (1.676 m)   Wt 70.5 kg (155 lb 8 oz)   SpO2 98%   BMI 25.10 kg/m   Physical Exam: Pleasant black male in no acute distress Lungs:  Clear Cardiac:  Regular rhythm, normal S1 and S2, no S3, displaced apex Abdomen:  Soft, nontender, no masses Extremities:  No edema present  Intake/Output from previous day: No intake/output data recorded.  Weight Filed Weights   06/07/17 0344 06/08/17 0448  Weight: 71.3 kg (157 lb 3.2 oz) 70.5 kg (155 lb 8 oz)    Lab Results: Basic Metabolic Panel:  Recent Labs  06/06/17 2112  NA 143  K 4.1  CL 109  CO2 27  GLUCOSE 111*  BUN 10  CREATININE 1.19   CBC:  Recent Labs  06/06/17 2112  WBC 5.8  NEUTROABS 3.8  HGB 12.3*  HCT 38.4*  MCV 81.4  PLT 162   Cardiac Enzymes: Troponin (Point of Care Test)  Recent Labs  06/07/17 0128  TROPIPOC 0.14*   Cardiac Panel (last 3 results)  Recent Labs  06/07/17 0425 06/07/17 0731 06/07/17 1425  TROPONINI 0.14* 0.13* 0.09*    Telemetry: Personally reviewed.  Normal sinus rhythm  Assessment/Plan:  1.  Unstable angina pectoris 2.  Recent myocardial infarction with stenting 3.  Ischemic cardiomyopathy 4.  Chronic systolic and diastolic heart failure  Recommendations:  Catheterization is planned for Monday to recheck stent.  Heparin to start when INR drops under 2.     W. Doristine Church  MD [facility]  06/08/2017, 11:45 AM

## 2017-06-08 NOTE — Progress Notes (Signed)
Spoke with Richardson Landry with [employer] with [employer] per [employer] recommendation. Order placed for rep to bring battery out to patient. Per Richardson Landry, the rep will contact charge nurse regarding date/time of arrival for battery.

## 2017-06-09 LAB — CBC
HCT: 37.3 % — ABNORMAL LOW (ref 39.0–52.0)
Hemoglobin: 12.3 g/dL — ABNORMAL LOW (ref 13.0–17.0)
MCH: 26.2 pg (ref 26.0–34.0)
MCHC: 33 g/dL (ref 30.0–36.0)
MCV: 79.4 fL (ref 78.0–100.0)
Platelets: 151 10*3/uL (ref 150–400)
RBC: 4.7 MIL/uL (ref 4.22–5.81)
RDW: 15.3 % (ref 11.5–15.5)
WBC: 7.3 10*3/uL (ref 4.0–10.5)

## 2017-06-09 LAB — PROTIME-INR
INR: 1.74
Prothrombin Time: 20.6 seconds — ABNORMAL HIGH (ref 11.4–15.2)

## 2017-06-09 LAB — GLUCOSE, CAPILLARY
GLUCOSE-CAPILLARY: 100 mg/dL — AB (ref 65–99)
Glucose-Capillary: 208 mg/dL — ABNORMAL HIGH (ref 65–99)
Glucose-Capillary: 141 mg/dL — ABNORMAL HIGH (ref 65–99)
Glucose-Capillary: 124 mg/dL — ABNORMAL HIGH (ref 65–99)

## 2017-06-09 LAB — VITAMIN B1: Vitamin B1 (Thiamine): 107.4 nmol/L (ref 66.5–200.0)

## 2017-06-09 LAB — HEPARIN LEVEL (UNFRACTIONATED)
HEPARIN UNFRACTIONATED: 0.27 [IU]/mL — AB (ref 0.30–0.70)
HEPARIN UNFRACTIONATED: 0.41 [IU]/mL (ref 0.30–0.70)

## 2017-06-09 MED ORDER — ASPIRIN 81 MG PO CHEW
81.0000 mg | CHEWABLE_TABLET | ORAL | Status: AC
  Administered 2017-06-10: 81 mg via ORAL
  Filled 2017-06-09: qty 1

## 2017-06-09 MED ORDER — SODIUM CHLORIDE 0.9 % IV SOLN: INTRAVENOUS | Status: DC

## 2017-06-09 MED ORDER — SODIUM CHLORIDE 0.9 % WEIGHT BASED INFUSION
3.0000 mL/kg/h | INTRAVENOUS | Status: DC
  Administered 2017-06-10: 3 mL/kg/h via INTRAVENOUS

## 2017-06-09 MED ORDER — ASPIRIN 81 MG PO CHEW: 81.0000 mg | CHEWABLE_TABLET | ORAL | Status: AC

## 2017-06-09 MED ORDER — SODIUM CHLORIDE 0.9 % IV SOLN: 250.0000 mL | INTRAVENOUS | Status: DC | PRN

## 2017-06-09 MED ORDER — SODIUM CHLORIDE 0.9% FLUSH
3.0000 mL | Freq: Two times a day (BID) | INTRAVENOUS | Status: DC
  Administered 2017-06-09: 3 mL via INTRAVENOUS

## 2017-06-09 MED ORDER — SODIUM CHLORIDE 0.9% FLUSH: 3.0000 mL | Freq: Two times a day (BID) | INTRAVENOUS | Status: DC

## 2017-06-09 MED ORDER — SODIUM CHLORIDE 0.9% FLUSH: 3.0000 mL | INTRAVENOUS | Status: DC | PRN

## 2017-06-09 MED ORDER — SODIUM CHLORIDE 0.9% FLUSH
3.0000 mL | INTRAVENOUS | Status: DC | PRN
Start: 2017-06-09 — End: 2017-06-10

## 2017-06-09 MED ORDER — SODIUM CHLORIDE 0.9 % WEIGHT BASED INFUSION
1.0000 mL/kg/h | INTRAVENOUS | Status: DC
  Administered 2017-06-10: 1 mL/kg/h via INTRAVENOUS

## 2017-06-09 NOTE — Progress Notes (Signed)
NT told me he thinks pt smoked marijuana in the room. Upon entering, room smelled like smoke. Pt denied smoking. Education provided. Charge nurse aware. Will continue to monitor.

## 2017-06-09 NOTE — Progress Notes (Signed)
Subjective:  No complaints of chest pain or shortness of breath today.  The numbness in his face is better.  No additional jerking motions.  Objective:  Vital Signs in the last 24 hours: BP 121/85   Pulse 96   Temp 98.2 F (36.8 C) (Oral)   Resp 20   Ht 5\' 6"  (1.676 m)   Wt 70.9 kg (156 lb 4.8 oz)   SpO2 96%   BMI 25.23 kg/m   Physical Exam: Pleasant black male in no acute distress Lungs:  Clear Cardiac:  Regular rhythm, normal S1 and S2, no S3, displaced apex Abdomen:  Soft, nontender, no masses Extremities:  No edema present  Intake/Output from previous day: 08/11 0701 - 08/12 0700 In: 110.1 [I.V.:110.1] Out: 950 [Urine:950]  Weight Filed Weights   06/07/17 0344 06/08/17 0448 06/09/17 0554  Weight: 71.3 kg (157 lb 3.2 oz) 70.5 kg (155 lb 8 oz) 70.9 kg (156 lb 4.8 oz)    Lab Results: Basic Metabolic Panel:  Recent Labs  06/06/17 2112  NA 143  K 4.1  CL 109  CO2 27  GLUCOSE 111*  BUN 10  CREATININE 1.19   CBC:  Recent Labs  06/06/17 2112 06/09/17 0222  WBC 5.8 7.3  NEUTROABS 3.8  --   HGB 12.3* 12.3*  HCT 38.4* 37.3*  MCV 81.4 79.4  PLT 162 151   Cardiac Enzymes: Troponin (Point of Care Test)  Recent Labs  06/07/17 0128  TROPIPOC 0.14*   Cardiac Panel (last 3 results)  Recent Labs  06/07/17 0425 06/07/17 0731 06/07/17 1425  TROPONINI 0.14* 0.13* 0.09*    Telemetry: Personally reviewed.  Normal sinus rhythm  Assessment/Plan:  1.  Unstable angina pectoris 2.  Recent myocardial infarction with stenting 3.  Ischemic cardiomyopathy 4.  Chronic systolic and diastolic heart failure  Recommendations:  INR is now less than 2 and he is currently on heparin.  Catheterization is planned for tomorrow.Cardiac catheterization was discussed with the patient fully including risks of myocardial infarction, death, stroke, bleeding, arrhythmia, dye allergy, renal insufficiency or bleeding.  The patient understands and is willing to proceed.   Possibility of intervention at the same time also discussed with patient and he understands and is agreeable to proceed      W. Doristine Church  MD [facility] Cardiology  06/09/2017, 11:18 AM

## 2017-06-09 NOTE — Progress Notes (Signed)
PROGRESS NOTE    Daniel Li   OHY:[id number]  DOB: [birth year]  DOA: 06/06/2017 PCP: Rosita Fire, MD   Brief Narrative:  Daniel Li a 61 y.o. male with a hx of CAD with NSTEMI in 2007, STEMI in 2013, NSTEMI in 2014, STEMI in 4/18 ,   ischemic cardiomyopathy with EF of 20% and Life Vest, Grade 2dCHF, DM, LV thrombus on Coumadin, HTN, HLD, tobacco abuse, OSA on CPAP. Sent home in April on ASA, Plavix and Coumadin.   Subjective: Chest pain and right shoulder pain is much better. No new complaints.  ROS: no complaints of nausea, vomiting, constipation diarrhea, cough, dyspnea or dysuria. No other complaints.   Assessment & Plan:   Principal Problem:   Chest pain - Morphine helps, nitro did not - Trop 0.14 - R shoulder tenderness and musculoskeletal chest wall pain >> Voltaren gel, Toradol and heating pad- no longer tender today - cardiology plans on cath on Monday as INR is elevated  Active Problems:   LV (left ventricular) mural thrombus with acute MI ([facility]) - Coumadin>>  Heparin      Diabetes mellitus type 2 with neuropathy - Last A1c 6.7 on 02/22/15, well controled. Patient is taking glipizide at home - ISS - Neurontin    Hypertension, essential -continue Coreg, losartan, Lasix and spironolactone for CHF -IV hydralazine when necessary   Chronic combined systolic (congestive) and diastolic (congestive) heart failure  - EF 20-25 % - cont Cozaar, Dig, Lasix and Aldactone - life vest- has run out of battery, trying to replace- cont telemetry    Hypercholesterolemia - Zetia     DVT prophylaxis: Coumadin Code Status: Full code Family Communication:  Disposition Plan:  Consultants:   cardiology Procedures:    Antimicrobials:  Anti-infectives    None       Objective: Vitals:   06/08/17 1108 06/08/17 2200 06/09/17 0554 06/09/17 0900  BP: 110/77 112/89 103/81 121/85  Pulse:  77 71 96  Resp:  18 20   Temp: 98.3 F (36.8 C) 98.3 F (36.8  C) 98.2 F (36.8 C)   TempSrc: Oral Oral Oral   SpO2: 98% 96% 96%   Weight:   70.9 kg (156 lb 4.8 oz)   Height:        Intake/Output Summary (Last 24 hours) at 06/09/17 0953 Last data filed at 06/09/17 0700  Gross per 24 hour  Intake           110.12 ml  Output              950 ml  Net          -839.88 ml   Filed Weights   06/07/17 0344 06/08/17 0448 06/09/17 0554  Weight: 71.3 kg (157 lb 3.2 oz) 70.5 kg (155 lb 8 oz) 70.9 kg (156 lb 4.8 oz)    Examination: General exam: Appears comfortable  HEENT: PERRLA, oral mucosa moist, no sclera icterus or thrush Respiratory system: Clear to auscultation. Respiratory effort normal. Cardiovascular system: S1 & S2 heard,  No murmurs  Gastrointestinal system: Abdomen soft, non-tender, nondistended. Normal bowel sound. No organomegaly Central nervous system: Alert and oriented. No focal neurological deficits. Extremities: No cyanosis, clubbing or edema Skin: No rashes or ulcers Psychiatry:  Mood & affect appropriate.    Data Reviewed: I have personally reviewed following labs and imaging studies  CBC:  Recent Labs Lab 06/06/17 2112 06/09/17 0222  WBC 5.8 7.3  NEUTROABS 3.8  --   HGB 12.3* 12.3*  HCT 38.4* 37.3*  MCV 81.4 79.4  PLT 162 156   Basic Metabolic Panel:  Recent Labs Lab 06/06/17 2112  NA 143  K 4.1  CL 109  CO2 27  GLUCOSE 111*  BUN 10  CREATININE 1.19  CALCIUM 9.1   GFR: Estimated Creatinine Clearance: 59.6 mL/min (by C-G formula based on SCr of 1.19 mg/dL). Liver Function Tests:  Recent Labs Lab 06/06/17 2112  AST 26  ALT 12*  ALKPHOS 44  BILITOT 0.5  PROT 6.0*  ALBUMIN 3.5   No results for input(s): LIPASE, AMYLASE in the last 168 hours. No results for input(s): AMMONIA in the last 168 hours. Coagulation Profile:  Recent Labs Lab 06/07/17 0425 06/08/17 0320 06/09/17 0222  INR 2.24 2.09 1.74   Cardiac Enzymes:  Recent Labs Lab 06/07/17 0425 06/07/17 0731 06/07/17 1425    TROPONINI 0.14* 0.13* 0.09*   BNP (last 3 results) No results for input(s): PROBNP in the last 8760 hours. HbA1C:  Recent Labs  06/07/17 0425  HGBA1C 6.4*   CBG:  Recent Labs Lab 06/08/17 0803 06/08/17 1106 06/08/17 1635 06/08/17 2159 06/09/17 0716  GLUCAP 97 140* 114* 93 100*   Lipid Profile:  Recent Labs  06/07/17 0425  CHOL 180  HDL 56  LDLCALC 112*  TRIG 58  CHOLHDL 3.2   Thyroid Function Tests:  Recent Labs  06/06/17 2137  TSH 0.728   Anemia Panel: No results for input(s): VITAMINB12, FOLATE, FERRITIN, TIBC, IRON, RETICCTPCT in the last 72 hours. Urine analysis: No results found for: COLORURINE, APPEARANCEUR, LABSPEC, PHURINE, GLUCOSEU, HGBUR, BILIRUBINUR, KETONESUR, PROTEINUR, UROBILINOGEN, NITRITE, LEUKOCYTESUR Sepsis Labs: @LABRCNTIP (procalcitonin:4,lacticidven:4) )No results found for this or any previous visit (from the past 240 hour(s)).       Radiology Studies: No results found.    Scheduled Meds: . carvedilol  3.125 mg Oral BID WC  . clopidogrel  75 mg Oral Daily  . diclofenac sodium  4 g Topical QID  . digoxin  0.125 mg Oral Daily  . ezetimibe  10 mg Oral Daily  . furosemide  40 mg Oral Daily  . gabapentin  100 mg Oral BID  . insulin aspart  0-5 Units Subcutaneous QHS  . insulin aspart  0-9 Units Subcutaneous TID WC  . ketorolac  30 mg Intravenous Q6H  . losartan  12.5 mg Oral Daily  . spironolactone  12.5 mg Oral QPM   Continuous Infusions: . heparin 1,250 Units/hr (06/09/17 0436)     LOS: 2 days    Time spent in minutes: 35    Debbe Odea, MD [facility] Pager: [email or web address] Password [facility] 06/09/2017, 9:53 AM

## 2017-06-09 NOTE — Progress Notes (Signed)
ANTICOAGULATION CONSULT NOTE - Follow Up Consult  Pharmacy Consult for Heparin when INR < 2 Indication: chest pain/ACS and LV thrombus  Allergies  Allergen Reactions  . Pork-Derived Products Other (See Comments)    Does not eat for religious reasons - okay with using IV heparin    Patient Measurements: Height: 5\' 6"  (167.6 cm) Weight: 156 lb 4.8 oz (70.9 kg) IBW/kg (Calculated) : 63.8 Heparin Dosing Weight: 72 kg  Vital Signs: Temp: 98.2 F (36.8 C) (08/12 0554) Temp Source: Oral (08/12 0554) BP: 121/85 (08/12 0900) Pulse Rate: 96 (08/12 0900)  Labs:  Recent Labs  06/06/17 2112 06/07/17 0425 06/07/17 0731 06/07/17 1425 06/08/17 0320 06/09/17 0222 06/09/17 1111  HGB 12.3*  --   --   --   --  12.3*  --   HCT 38.4*  --   --   --   --  37.3*  --   PLT 162  --   --   --   --  151  --   LABPROT  --  25.1*  --   --  23.8* 20.6*  --   INR  --  2.24  --   --  2.09 1.74  --   HEPARINUNFRC  --   --   --   --   --  0.27* 0.41  CREATININE 1.19  --   --   --   --   --   --   TROPONINI  --  0.14* 0.13* 0.09*  --   --   --     Estimated Creatinine Clearance: 59.6 mL/min (by C-G formula based on SCr of 1.19 mg/dL).   Medications:  Scheduled:  . carvedilol  3.125 mg Oral BID WC  . clopidogrel  75 mg Oral Daily  . diclofenac sodium  4 g Topical QID  . digoxin  0.125 mg Oral Daily  . ezetimibe  10 mg Oral Daily  . furosemide  40 mg Oral Daily  . gabapentin  100 mg Oral BID  . insulin aspart  0-5 Units Subcutaneous QHS  . insulin aspart  0-9 Units Subcutaneous TID WC  . ketorolac  30 mg Intravenous Q6H  . losartan  12.5 mg Oral Daily  . spironolactone  12.5 mg Oral QPM    Assessment: 61yo male presenting with chest pain on 8/10 with extensive cardiac hx on Coumadin pta for LV thrombus, to start heparin bridge when INR < 2. Heparin gtt started at 9PM on 8/11. Heparin level now therapeutic x 1 at 0.41. CBC stable and no s/s bleeding noted.   Goal of Therapy:  Heparin  level 0.3-0.7 units/ml Monitor platelets by anticoagulation protocol: Yes   Plan:  Continue heparin gtt to 1250 units/hr Confirmatory heparin level in 6 hrs Daily heparin level and CBC Monitor for s/s bleeding  Leroy Libman, PharmD Pharmacy Resident Pager: [phone]

## 2017-06-09 NOTE — Progress Notes (Signed)
ANTICOAGULATION CONSULT NOTE - Follow Up Consult  Pharmacy Consult for Heparin when INR < 2 Indication: chest pain/ACS and LV thrombus  Allergies  Allergen Reactions  . Pork-Derived Products Other (See Comments)    Does not eat for religious reasons - okay with using IV heparin    Patient Measurements: Height: 5\' 6"  (167.6 cm) Weight: 155 lb 8 oz (70.5 kg) IBW/kg (Calculated) : 63.8 Heparin Dosing Weight: 72 kg  Vital Signs: Temp: 98.3 F (36.8 C) (08/11 2200) Temp Source: Oral (08/11 2200) BP: 112/89 (08/11 2200) Pulse Rate: 77 (08/11 2200)  Labs:  Recent Labs  06/06/17 2112 06/07/17 0425 06/07/17 0731 06/07/17 1425 06/08/17 0320 06/09/17 0222  HGB 12.3*  --   --   --   --  12.3*  HCT 38.4*  --   --   --   --  37.3*  PLT 162  --   --   --   --  151  LABPROT  --  25.1*  --   --  23.8* 20.6*  INR  --  2.24  --   --  2.09 1.74  HEPARINUNFRC  --   --   --   --   --  0.27*  CREATININE 1.19  --   --   --   --   --   TROPONINI  --  0.14* 0.13* 0.09*  --   --     Estimated Creatinine Clearance: 59.6 mL/min (by C-G formula based on SCr of 1.19 mg/dL).   Medications:  Scheduled:  . carvedilol  3.125 mg Oral BID WC  . clopidogrel  75 mg Oral Daily  . diclofenac sodium  4 g Topical QID  . digoxin  0.125 mg Oral Daily  . ezetimibe  10 mg Oral Daily  . furosemide  40 mg Oral Daily  . gabapentin  100 mg Oral BID  . insulin aspart  0-5 Units Subcutaneous QHS  . insulin aspart  0-9 Units Subcutaneous TID WC  . ketorolac  30 mg Intravenous Q6H  . losartan  12.5 mg Oral Daily  . spironolactone  12.5 mg Oral QPM    Assessment: 61yo male presenting with chest pain on 8/10 with extensive cardiac hx on Coumadin pta for LV thrombus, to start heparin bridge when INR < 2. Heparin gtt started at 9PM on 8/11. INR this morning is 1.74 and heparin level is subtherapeutic at 0.27. RN reports no issues with heparin infusion overnight. CBC stable and no s/s bleeding noted.   Goal of  Therapy:  Heparin level 0.3-0.7 units/ml Monitor platelets by anticoagulation protocol: Yes   Plan:  Increase heparin gtt to 1250 units/hr Heparin level in 6 hrs Daily heparin level and CBC Monitor for s/s bleeding  Argie Ramming, PharmD Clinical Pharmacist 06/09/17 4:31 AM

## 2017-06-10 ENCOUNTER — Encounter (HOSPITAL_COMMUNITY): Payer: Self-pay | Admitting: Cardiovascular Disease

## 2017-06-10 ENCOUNTER — Encounter (HOSPITAL_COMMUNITY)
Admission: EM | Disposition: A | Discharge status: Home / Self Care | Payer: Self-pay | Source: Home / Self Care | Attending: Internal Medicine

## 2017-06-10 ENCOUNTER — Ambulatory Visit (HOSPITAL_COMMUNITY)
Admission: RE | Admit: 2017-06-10 | Payer: Medicaid Other | Source: Ambulatory Visit | Admitting: Interventional Cardiology

## 2017-06-10 DIAGNOSIS — I5023 Acute on chronic systolic (congestive) heart failure: Secondary | ICD-10-CM

## 2017-06-10 HISTORY — PX: LEFT HEART CATH AND CORONARY ANGIOGRAPHY: CATH118249

## 2017-06-10 LAB — BASIC METABOLIC PANEL
Anion gap: 7 (ref 5–15)
BUN: 17 mg/dL (ref 6–20)
CALCIUM: 8.8 mg/dL — AB (ref 8.9–10.3)
CO2: 27 mmol/L (ref 22–32)
Chloride: 108 mmol/L (ref 101–111)
Creatinine, Ser: 1.36 mg/dL — ABNORMAL HIGH (ref 0.61–1.24)
GFR, EST NON AFRICAN AMERICAN: 55 mL/min — AB (ref >60)
Glucose, Bld: 106 mg/dL — ABNORMAL HIGH (ref 65–99)
POTASSIUM: 4.4 mmol/L (ref 3.5–5.1)
SODIUM: 142 mmol/L (ref 135–145)

## 2017-06-10 LAB — GLUCOSE, CAPILLARY
GLUCOSE-CAPILLARY: 106 mg/dL — AB (ref 65–99)
GLUCOSE-CAPILLARY: 106 mg/dL — AB (ref 65–99)
GLUCOSE-CAPILLARY: 134 mg/dL — AB (ref 65–99)
Glucose-Capillary: 91 mg/dL (ref 65–99)

## 2017-06-10 LAB — PROTIME-INR
INR: 1.59
PROTHROMBIN TIME: 19.2 s — AB (ref 11.4–15.2)

## 2017-06-10 LAB — CBC
HEMATOCRIT: 35.6 % — AB (ref 39.0–52.0)
HEMOGLOBIN: 11.8 g/dL — AB (ref 13.0–17.0)
MCH: 26 pg (ref 26.0–34.0)
MCHC: 33.1 g/dL (ref 30.0–36.0)
MCV: 78.6 fL (ref 78.0–100.0)
Platelets: 149 10*3/uL — ABNORMAL LOW (ref 150–400)
RBC: 4.53 MIL/uL (ref 4.22–5.81)
RDW: 15.1 % (ref 11.5–15.5)
WBC: 7.6 10*3/uL (ref 4.0–10.5)

## 2017-06-10 LAB — HEPARIN LEVEL (UNFRACTIONATED): HEPARIN UNFRACTIONATED: 0.38 [IU]/mL (ref 0.30–0.70)

## 2017-06-10 SURGERY — LEFT HEART CATH AND CORONARY ANGIOGRAPHY
Anesthesia: LOCAL

## 2017-06-10 MED ORDER — SODIUM CHLORIDE 0.9% FLUSH: 3.0000 mL | Freq: Two times a day (BID) | INTRAVENOUS | Status: DC

## 2017-06-10 MED ORDER — VERAPAMIL HCL 2.5 MG/ML IV SOLN
INTRAVENOUS | Status: DC | PRN
  Administered 2017-06-10: 10 mL via INTRA_ARTERIAL

## 2017-06-10 MED ORDER — SODIUM CHLORIDE 0.9 % IV SOLN: 250.0000 mL | INTRAVENOUS | Status: DC | PRN

## 2017-06-10 MED ORDER — FENTANYL CITRATE (PF) 100 MCG/2ML IJ SOLN
INTRAMUSCULAR | Status: DC | PRN
  Administered 2017-06-10: 25 ug via INTRAVENOUS

## 2017-06-10 MED ORDER — IOPAMIDOL (ISOVUE-370) INJECTION 76%
INTRAVENOUS | Status: DC | PRN
  Administered 2017-06-10: 70 mL via INTRA_ARTERIAL

## 2017-06-10 MED ORDER — HEPARIN (PORCINE) IN NACL 100-0.45 UNIT/ML-% IJ SOLN
1300.0000 [IU]/h | INTRAMUSCULAR | Status: DC
  Administered 2017-06-10: 1250 [IU]/h via INTRAVENOUS
  Filled 2017-06-10: qty 250

## 2017-06-10 MED ORDER — FUROSEMIDE 10 MG/ML IJ SOLN
40.0000 mg | Freq: Two times a day (BID) | INTRAMUSCULAR | Status: DC
  Administered 2017-06-10 – 2017-06-11 (×2): 40 mg via INTRAVENOUS
  Filled 2017-06-10 (×2): qty 4

## 2017-06-10 MED ORDER — HEPARIN (PORCINE) IN NACL 2-0.9 UNIT/ML-% IJ SOLN
INTRAMUSCULAR | Status: AC
  Filled 2017-06-10: qty 1000

## 2017-06-10 MED ORDER — WARFARIN SODIUM 6 MG PO TABS
12.0000 mg | ORAL_TABLET | Freq: Once | ORAL | Status: AC
  Administered 2017-06-10: 12 mg via ORAL
  Filled 2017-06-10: qty 2
  Filled 2017-06-10: qty 6

## 2017-06-10 MED ORDER — LIDOCAINE HCL (PF) 1 % IJ SOLN
INTRAMUSCULAR | Status: DC | PRN
  Administered 2017-06-10: 2 mL via INTRADERMAL

## 2017-06-10 MED ORDER — LIDOCAINE HCL (PF) 1 % IJ SOLN
INTRAMUSCULAR | Status: AC
  Filled 2017-06-10: qty 30

## 2017-06-10 MED ORDER — IOPAMIDOL (ISOVUE-370) INJECTION 76%
INTRAVENOUS | Status: AC
  Filled 2017-06-10: qty 100

## 2017-06-10 MED ORDER — WARFARIN - PHYSICIAN DOSING INPATIENT: Freq: Every day | Status: DC

## 2017-06-10 MED ORDER — VERAPAMIL HCL 2.5 MG/ML IV SOLN
INTRAVENOUS | Status: AC
  Filled 2017-06-10: qty 2

## 2017-06-10 MED ORDER — SODIUM CHLORIDE 0.9% FLUSH: 3.0000 mL | INTRAVENOUS | Status: DC | PRN

## 2017-06-10 MED ORDER — HEPARIN SODIUM (PORCINE) 1000 UNIT/ML IJ SOLN
INTRAMUSCULAR | Status: DC | PRN
  Administered 2017-06-10: 3500 [IU] via INTRAVENOUS

## 2017-06-10 MED ORDER — SODIUM CHLORIDE 0.9 % IV SOLN: INTRAVENOUS | Status: AC

## 2017-06-10 MED ORDER — FENTANYL CITRATE (PF) 100 MCG/2ML IJ SOLN
INTRAMUSCULAR | Status: AC
  Filled 2017-06-10: qty 2

## 2017-06-10 MED ORDER — MIDAZOLAM HCL 2 MG/2ML IJ SOLN
INTRAMUSCULAR | Status: AC
  Filled 2017-06-10: qty 2

## 2017-06-10 MED ORDER — HEPARIN (PORCINE) IN NACL 2-0.9 UNIT/ML-% IJ SOLN
INTRAMUSCULAR | Status: AC | PRN
  Administered 2017-06-10: 1000 mL

## 2017-06-10 MED ORDER — MIDAZOLAM HCL 2 MG/2ML IJ SOLN
INTRAMUSCULAR | Status: DC | PRN
Start: 2017-06-10 — End: 2017-06-10
  Administered 2017-06-10: 1 mg via INTRAVENOUS

## 2017-06-10 MED ORDER — HEPARIN SODIUM (PORCINE) 1000 UNIT/ML IJ SOLN
INTRAMUSCULAR | Status: AC
  Filled 2017-06-10: qty 1

## 2017-06-10 SURGICAL SUPPLY — 13 items
CATH EXPO 5F FL3.5 (CATHETERS) ×2 IMPLANT
CATH EXPO 5FR FR4 (CATHETERS) ×2 IMPLANT
CATH INFINITI 5FR ANG PIGTAIL (CATHETERS) ×2 IMPLANT
DEVICE RAD COMP TR BAND LRG (VASCULAR PRODUCTS) ×2 IMPLANT
GLIDESHEATH SLEND SS 6F .021 (SHEATH) ×2 IMPLANT
GUIDEWIRE INQWIRE 1.5J.035X260 (WIRE) ×1 IMPLANT
INQWIRE 1.5J .035X260CM (WIRE) ×2
KIT HEART LEFT (KITS) ×2 IMPLANT
PACK CARDIAC CATHETERIZATION (CUSTOM PROCEDURE TRAY) ×2 IMPLANT
SHEATH PINNACLE 5F 10CM (SHEATH) ×2 IMPLANT
TRANSDUCER W/STOPCOCK (MISCELLANEOUS) ×2 IMPLANT
TUBING CIL FLEX 10 FLL-RA (TUBING) ×2 IMPLANT
WIRE EMERALD 3MM-J .035X150CM (WIRE) ×2 IMPLANT

## 2017-06-10 NOTE — Progress Notes (Signed)
PROGRESS NOTE    Daniel Li   UVO:[id number]  DOB: 12-Nov-1955  DOA: 06/06/2017 PCP: Rosita Fire, MD   Brief Narrative:  Daniel Li a 61 y.o. male with a hx of CAD with NSTEMI in 2007, STEMI in 2013, NSTEMI in 2014, STEMI in 4/18 ,   ischemic cardiomyopathy with EF of 20% and Life Vest, Grade 2dCHF, DM, LV thrombus on Coumadin, HTN, HLD, tobacco abuse, OSA on CPAP. Sent home in April on ASA, Plavix and Coumadin.   Subjective:   No new complaints. No pain ROS: no complaints of nausea, vomiting, constipation diarrhea, cough, dyspnea or dysuria. No other complaints.   Assessment & Plan:   Principal Problem:   Chest pain - Morphine helps, nitro did not - Trop 0.14 - R shoulder tenderness and musculoskeletal chest wall pain >> Voltaren gel, Toradol and heating pad- no longer tender today - cath today- diffuse CAD but no areas amenable to stent- elevated filling pressures and cardiology has increased Lasix  Active Problems:  Chronic combined systolic (congestive) and diastolic (congestive) heart failure  - EF 20-25 % - cont Cozaar, Dig, Lasix and Aldactone - life vest- has run out of battery, trying to replace- cont telemetry - IV Lasix started today per cardiology    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 Patient is taking glipizide at home - ISS - Neurontin    Hypertension, essential -continue Coreg, losartan, Lasix and spironolactone for CHF -IV hydralazine when necessary    Hypercholesterolemia - Zetia     DVT prophylaxis: Coumadin Code Status: Full code Family Communication:  Disposition Plan:  Consultants:   cardiology Procedures:    Antimicrobials:  Anti-infectives    None       Objective: Vitals:   06/10/17 1133 06/10/17 1138 06/10/17 1143 06/10/17 1148  BP: (!) 121/94 114/89 112/88 (!) 117/92    Pulse: 81 79 76 75  Resp: (!) 22 (!) 22 18 14   Temp:      TempSrc:      SpO2: 92% 94% 98% 95%  Weight:      Height:        Intake/Output Summary (Last 24 hours) at 06/10/17 1232 Last data filed at 06/10/17 0900  Gross per 24 hour  Intake           733.25 ml  Output             1050 ml  Net          -316.75 ml   Filed Weights   06/08/17 0448 06/09/17 0554 06/10/17 0423  Weight: 70.5 kg (155 lb 8 oz) 70.9 kg (156 lb 4.8 oz) 71.2 kg (157 lb)    Examination: General exam: Appears comfortable  HEENT: PERRLA, oral mucosa moist, no sclera icterus or thrush Respiratory system: Clear to auscultation. Respiratory effort normal. Cardiovascular system: S1 & S2 heard,  No murmurs  Gastrointestinal system: Abdomen soft, non-tender, nondistended. Normal bowel sound. No organomegaly Central nervous system: Alert and oriented. No focal neurological deficits. Extremities: No cyanosis, clubbing or edema Skin: No rashes or ulcers Psychiatry:  Mood & affect appropriate.    Data Reviewed: I have personally reviewed following labs and imaging studies  CBC:  Recent Labs Lab 06/06/17 2112 06/09/17 0222 06/10/17 0145  WBC 5.8 7.3 7.6  NEUTROABS 3.8  --   --  HGB 12.3* 12.3* 11.8*  HCT 38.4* 37.3* 35.6*  MCV 81.4 79.4 78.6  PLT 162 151 161*   Basic Metabolic Panel:  Recent Labs Lab 06/06/17 2112 06/10/17 0428  NA 143 142  K 4.1 4.4  CL 109 108  CO2 27 27  GLUCOSE 111* 106*  BUN 10 17  CREATININE 1.19 1.36*  CALCIUM 9.1 8.8*   GFR: Estimated Creatinine Clearance: 52.1 mL/min (A) (by C-G formula based on SCr of 1.36 mg/dL (H)). Liver Function Tests:  Recent Labs Lab 06/06/17 2112  AST 26  ALT 12*  ALKPHOS 44  BILITOT 0.5  PROT 6.0*  ALBUMIN 3.5   No results for input(s): LIPASE, AMYLASE in the last 168 hours. No results for input(s): AMMONIA in the last 168 hours. Coagulation Profile:  Recent Labs Lab 06/07/17 0425 06/08/17 0320 06/09/17 0222 06/10/17 0145   INR 2.24 2.09 1.74 1.59   Cardiac Enzymes:  Recent Labs Lab 06/07/17 0425 06/07/17 0731 06/07/17 1425  TROPONINI 0.14* 0.13* 0.09*   BNP (last 3 results) No results for input(s): PROBNP in the last 8760 hours. HbA1C: No results for input(s): HGBA1C in the last 72 hours. CBG:  Recent Labs Lab 06/09/17 1136 06/09/17 1632 06/09/17 2138 06/10/17 0727 06/10/17 1053  GLUCAP 208* 141* 124* 106* 91   Lipid Profile: No results for input(s): CHOL, HDL, LDLCALC, TRIG, CHOLHDL, LDLDIRECT in the last 72 hours. Thyroid Function Tests: No results for input(s): TSH, T4TOTAL, FREET4, T3FREE, THYROIDAB in the last 72 hours. Anemia Panel: No results for input(s): VITAMINB12, FOLATE, FERRITIN, TIBC, IRON, RETICCTPCT in the last 72 hours. Urine analysis: No results found for: COLORURINE, APPEARANCEUR, LABSPEC, PHURINE, GLUCOSEU, HGBUR, BILIRUBINUR, KETONESUR, PROTEINUR, UROBILINOGEN, NITRITE, LEUKOCYTESUR Sepsis Labs: @LABRCNTIP (procalcitonin:4,lacticidven:4) )No results found for this or any previous visit (from the past 240 hour(s)).       Radiology Studies: No results found.    Scheduled Meds: . [MAR Hold] carvedilol  3.125 mg Oral BID WC  . [MAR Hold] clopidogrel  75 mg Oral Daily  . [MAR Hold] diclofenac sodium  4 g Topical QID  . [MAR Hold] digoxin  0.125 mg Oral Daily  . [MAR Hold] ezetimibe  10 mg Oral Daily  . furosemide  40 mg Intravenous BID  . [MAR Hold] gabapentin  100 mg Oral BID  . [MAR Hold] insulin aspart  0-5 Units Subcutaneous QHS  . [MAR Hold] insulin aspart  0-9 Units Subcutaneous TID WC  . [MAR Hold] losartan  12.5 mg Oral Daily  . sodium chloride flush  3 mL Intravenous Q12H  . [MAR Hold] spironolactone  12.5 mg Oral QPM   Continuous Infusions: . sodium chloride 50 mL/hr at 06/10/17 1215  . sodium chloride       LOS: 3 days    Time spent in minutes: 35    Debbe Odea, MD [facility] Pager: [email or web address] Password [facility] 06/10/2017,  12:32 PM

## 2017-06-10 NOTE — H&P (View-Only) (Signed)
Subjective:  No complaints of chest pain or shortness of breath today.  The numbness in his face is better.  No additional jerking motions.  Objective:  Vital Signs in the last 24 hours: BP 121/85   Pulse 96   Temp 98.2 F (36.8 C) (Oral)   Resp 20   Ht 5\' 6"  (1.676 m)   Wt 70.9 kg (156 lb 4.8 oz)   SpO2 96%   BMI 25.23 kg/m   Physical Exam: Pleasant black male in no acute distress Lungs:  Clear Cardiac:  Regular rhythm, normal S1 and S2, no S3, displaced apex Abdomen:  Soft, nontender, no masses Extremities:  No edema present  Intake/Output from previous day: 08/11 0701 - 08/12 0700 In: 110.1 [I.V.:110.1] Out: 950 [Urine:950]  Weight Filed Weights   06/07/17 0344 06/08/17 0448 06/09/17 0554  Weight: 71.3 kg (157 lb 3.2 oz) 70.5 kg (155 lb 8 oz) 70.9 kg (156 lb 4.8 oz)    Lab Results: Basic Metabolic Panel:  Recent Labs  06/06/17 2112  NA 143  K 4.1  CL 109  CO2 27  GLUCOSE 111*  BUN 10  CREATININE 1.19   CBC:  Recent Labs  06/06/17 2112 06/09/17 0222  WBC 5.8 7.3  NEUTROABS 3.8  --   HGB 12.3* 12.3*  HCT 38.4* 37.3*  MCV 81.4 79.4  PLT 162 151   Cardiac Enzymes: Troponin (Point of Care Test)  Recent Labs  06/07/17 0128  TROPIPOC 0.14*   Cardiac Panel (last 3 results)  Recent Labs  06/07/17 0425 06/07/17 0731 06/07/17 1425  TROPONINI 0.14* 0.13* 0.09*    Telemetry: Personally reviewed.  Normal sinus rhythm  Assessment/Plan:  1.  Unstable angina pectoris 2.  Recent myocardial infarction with stenting 3.  Ischemic cardiomyopathy 4.  Chronic systolic and diastolic heart failure  Recommendations:  INR is now less than 2 and he is currently on heparin.  Catheterization is planned for tomorrow.Cardiac catheterization was discussed with the patient fully including risks of myocardial infarction, death, stroke, bleeding, arrhythmia, dye allergy, renal insufficiency or bleeding.  The patient understands and is willing to proceed.   Possibility of intervention at the same time also discussed with patient and he understands and is agreeable to proceed      W. Doristine Church  MD [facility] Cardiology  06/09/2017, 11:18 AM

## 2017-06-10 NOTE — Interval H&P Note (Signed)
History and Physical Interval Note:  06/10/2017 11:14 AM  Daniel Li  has presented today for cardiac cath with the diagnosis of NSTEMI/unstable angina  The various methods of treatment have been discussed with the patient and family. After consideration of risks, benefits and other options for treatment, the patient has consented to  Procedure(s): LEFT HEART CATH AND CORONARY ANGIOGRAPHY (N/A) as a surgical intervention .  The patient's history has been reviewed, patient examined, no change in status, stable for surgery.  I have reviewed the patient's chart and labs.  Questions were answered to the patient's satisfaction.    Cath Lab Visit (complete for each Cath Lab visit)  Clinical Evaluation Leading to the Procedure:   ACS: Yes.    Non-ACS:    Anginal Classification: CCS III  Anti-ischemic medical therapy: Minimal Therapy (1 class of medications)  Non-Invasive Test Results: No non-invasive testing performed  Prior CABG: No previous CABG         Lauree Chandler

## 2017-06-10 NOTE — Progress Notes (Signed)
ANTICOAGULATION CONSULT NOTE - Follow Up Consult  Pharmacy Consult for Heparin/coumadin Indication: chest pain/ACS and LV thrombus  Allergies  Allergen Reactions  . Pork-Derived Products Other (See Comments)    Does not eat for religious reasons - okay with using IV heparin    Patient Measurements: Height: 5\' 6"  (167.6 cm) Weight: 157 lb (71.2 kg) IBW/kg (Calculated) : 63.8 Heparin Dosing Weight: 72 kg  Vital Signs: Temp: 97.7 F (36.5 C) (08/13 0423) Temp Source: Oral (08/13 0423) BP: 117/92 (08/13 1148) Pulse Rate: 75 (08/13 1148)  Labs:  Recent Labs  06/07/17 1425 06/08/17 0320 06/09/17 0222 06/09/17 1111 06/10/17 0145 06/10/17 0428  HGB  --   --  12.3*  --  11.8*  --   HCT  --   --  37.3*  --  35.6*  --   PLT  --   --  151  --  149*  --   LABPROT  --  23.8* 20.6*  --  19.2*  --   INR  --  2.09 1.74  --  1.59  --   HEPARINUNFRC  --   --  0.27* 0.41 0.38  --   CREATININE  --   --   --   --   --  1.36*  TROPONINI 0.09*  --   --   --   --   --     Estimated Creatinine Clearance: 52.1 mL/min (A) (by C-G formula based on SCr of 1.36 mg/dL (H)).   Medications:  Scheduled:  . carvedilol  3.125 mg Oral BID WC  . clopidogrel  75 mg Oral Daily  . diclofenac sodium  4 g Topical QID  . digoxin  0.125 mg Oral Daily  . ezetimibe  10 mg Oral Daily  . furosemide  40 mg Intravenous BID  . gabapentin  100 mg Oral BID  . insulin aspart  0-5 Units Subcutaneous QHS  . insulin aspart  0-9 Units Subcutaneous TID WC  . losartan  12.5 mg Oral Daily  . sodium chloride flush  3 mL Intravenous Q12H  . spironolactone  12.5 mg Oral QPM  . warfarin  12 mg Oral ONCE-1800  . Warfarin - Physician Dosing Inpatient   Does not apply q1800    Assessment: 61yo male presenting with chest pain on 8/10 with extensive cardiac hx on Coumadin pta for LV thrombus,. He is now s/p cath (not amenable to PCI) to restart heparin/coumadin. Sheath removed at ~ 12pm.. Coumadin 12.5mg  po today per MD.   -last heparin rate was 1250/hr w/ HL= 0.38 -INR= 1.59  Goal of Therapy:  Heparin level 0.3-0.7 units/ml Monitor platelets by anticoagulation protocol: Yes   Plan:  Restart heparin at 8pm at 1250 units/hr Daily heparin level and CBC Daily PT/INR  Hildred Laser, Pharm D 06/10/2017 1:17 PM

## 2017-06-11 ENCOUNTER — Other Ambulatory Visit (HOSPITAL_COMMUNITY): Payer: Medicaid Other

## 2017-06-11 ENCOUNTER — Inpatient Hospital Stay (HOSPITAL_COMMUNITY): Admission: RE | Admit: 2017-06-11 | Payer: Medicaid Other | Source: Ambulatory Visit | Admitting: Cardiology

## 2017-06-11 LAB — BASIC METABOLIC PANEL
Anion gap: 7 (ref 5–15)
BUN: 19 mg/dL (ref 6–20)
CHLORIDE: 108 mmol/L (ref 101–111)
CO2: 27 mmol/L (ref 22–32)
CREATININE: 1.42 mg/dL — AB (ref 0.61–1.24)
Calcium: 8.8 mg/dL — ABNORMAL LOW (ref 8.9–10.3)
GFR calc Af Amer: >60 mL/min (ref >60)
GFR calc non Af Amer: 52 mL/min — ABNORMAL LOW (ref >60)
Glucose, Bld: 93 mg/dL (ref 65–99)
POTASSIUM: 3.7 mmol/L (ref 3.5–5.1)
Sodium: 142 mmol/L (ref 135–145)

## 2017-06-11 LAB — CBC
HEMATOCRIT: 35.8 % — AB (ref 39.0–52.0)
HEMOGLOBIN: 11.9 g/dL — AB (ref 13.0–17.0)
MCH: 26.1 pg (ref 26.0–34.0)
MCHC: 33.2 g/dL (ref 30.0–36.0)
MCV: 78.5 fL (ref 78.0–100.0)
Platelets: 155 10*3/uL (ref 150–400)
RBC: 4.56 MIL/uL (ref 4.22–5.81)
RDW: 15.2 % (ref 11.5–15.5)
WBC: 7.7 10*3/uL (ref 4.0–10.5)

## 2017-06-11 LAB — PROTIME-INR
INR: 1.54
Prothrombin Time: 18.6 seconds — ABNORMAL HIGH (ref 11.4–15.2)

## 2017-06-11 LAB — GLUCOSE, CAPILLARY
Glucose-Capillary: 97 mg/dL (ref 65–99)
Glucose-Capillary: 94 mg/dL (ref 65–99)

## 2017-06-11 LAB — HEPARIN LEVEL (UNFRACTIONATED): Heparin Unfractionated: 0.17 IU/mL — ABNORMAL LOW (ref 0.30–0.70)

## 2017-06-11 MED ORDER — ENOXAPARIN SODIUM 80 MG/0.8ML ~~LOC~~ SOLN: 70.0000 mg | Freq: Two times a day (BID) | SUBCUTANEOUS | 0 refills | Status: DC

## 2017-06-11 MED ORDER — ENOXAPARIN SODIUM 80 MG/0.8ML ~~LOC~~ SOLN
70.0000 mg | Freq: Once | SUBCUTANEOUS | Status: AC
  Administered 2017-06-11: 13:00:00 70 mg via SUBCUTANEOUS
  Filled 2017-06-11: qty 0.8

## 2017-06-11 MED ORDER — METOLAZONE 2.5 MG PO TABS: 2.5000 mg | ORAL_TABLET | Freq: Every day | ORAL | 11 refills | Status: DC | PRN

## 2017-06-11 MED ORDER — ENOXAPARIN SODIUM 80 MG/0.8ML ~~LOC~~ SOLN: 70.0000 mg | Freq: Two times a day (BID) | SUBCUTANEOUS | Status: DC

## 2017-06-11 MED ORDER — POTASSIUM CHLORIDE CRYS ER 20 MEQ PO TBCR: 20.0000 meq | EXTENDED_RELEASE_TABLET | Freq: Every day | ORAL | 0 refills | Status: DC

## 2017-06-11 MED ORDER — ACETAMINOPHEN 325 MG PO TABS: 650.0000 mg | ORAL_TABLET | ORAL | Status: DC | PRN

## 2017-06-11 MED ORDER — FUROSEMIDE 40 MG PO TABS: 40.0000 mg | ORAL_TABLET | Freq: Two times a day (BID) | ORAL | Status: DC

## 2017-06-11 MED ORDER — POTASSIUM CHLORIDE CRYS ER 20 MEQ PO TBCR
20.0000 meq | EXTENDED_RELEASE_TABLET | Freq: Every day | ORAL | Status: DC
  Administered 2017-06-11: 20 meq via ORAL
  Filled 2017-06-11: qty 1

## 2017-06-11 MED ORDER — GABAPENTIN 100 MG PO CAPS: 100.0000 mg | ORAL_CAPSULE | Freq: Two times a day (BID) | ORAL | 0 refills | Status: DC

## 2017-06-11 MED ORDER — WARFARIN SODIUM 7.5 MG PO TABS: 7.5000 mg | ORAL_TABLET | Freq: Once | ORAL | Status: DC

## 2017-06-11 MED ORDER — FUROSEMIDE 40 MG PO TABS: 40.0000 mg | ORAL_TABLET | Freq: Two times a day (BID) | ORAL | 3 refills | Status: DC

## 2017-06-11 NOTE — Discharge Summary (Signed)
Physician Discharge Summary  Daniel Li ZWC:[id number] DOB: 1956-10-01 DOA: 06/06/2017  PCP: Rosita Fire, MD  Admit date: 06/06/2017 Discharge date: 06/11/2017  Admitted From: home Disposition:  home   Recommendations for Outpatient Follow-up:  1. 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 Sent home in April on ASA, Plavix and Coumadin.   Hospital Course:  Principal Problem:   Chest pain - Morphine helps, nitro did not - Trop 0.14 - R shoulder tenderness and musculoskeletal chest wall pain >> Voltaren gel, Toradol and heating pad- no longer tender   - 8/13 cath  - diffuse CAD but no areas amenable to stent- elevated filling pressures noted and cardiology increased Lasix  Active Problems:  Chronic combined systolic (congestive) and diastolic (congestive) heart failure  - EF 20-25 % - cont Cozaar, Dig, Lasix and Aldactone-  K 20 meq added, will add Zaroxlyn for PRn use for weight gain- life vest-   - IV Lasix started given yesterday per cardiology- Weight 157>> 155 lb - will have  cardiology f/u for ICD discussion    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 Patient is takingglipizideat home - ISS - Neurontin    Hypertension, essential -continue Coreg, losartan, Lasix and spironolactone for CHF -IV hydralazine when necessary    Hypercholesterolemia - Zetia     Discharge Instructions  Discharge Instructions    (HEART FAILURE PATIENTS) Call MD:  Anytime you have any of the following symptoms: 1) 3 pound weight gain in 24 hours or 5 pounds in 1 week 2) shortness of breath, with or without a dry hacking cough 3) swelling in the hands, feet or stomach 4) if you have to sleep on extra pillows at night in order to breathe.    Complete by:  As directed    Diet - low sodium heart healthy    Complete by:  As directed    Diet Carb Modified    Complete by:  As directed    Increase activity slowly    Complete by:  As directed      Allergies as of 06/11/2017      Reactions   Pork-derived Products Other (See Comments)   Does not eat for religious reasons - okay with using IV heparin  Medication List    TAKE these medications   acetaminophen 325 MG tablet Commonly known as:  TYLENOL Take 2 tablets (650 mg total) by mouth every 4 (four) hours as needed for headache or mild pain.   carvedilol 3.125 MG tablet Commonly known as:  COREG Take 1 tablet (3.125 mg total) by mouth 2 (two) times daily with a meal.   clopidogrel 75 MG tablet Commonly known as:  PLAVIX Take 1 tablet (75 mg total) by mouth daily.   digoxin 0.125 MG tablet Commonly known as:  LANOXIN Take 1 tablet (0.125 mg total) by mouth daily.   enoxaparin 80 MG/0.8ML injection Commonly known as:   LOVENOX Inject 0.7 mLs (70 mg total) into the skin every 12 (twelve) hours.   ezetimibe 10 MG tablet Commonly known as:  ZETIA Take 1 tablet (10 mg total) by mouth daily.   furosemide 40 MG tablet Commonly known as:  LASIX Take 1 tablet (40 mg total) by mouth 2 (two) times daily. What changed:  when to take this   gabapentin 100 MG capsule Commonly known as:  NEURONTIN Take 1 capsule (100 mg total) by mouth 2 (two) times daily.   glipiZIDE 10 MG 24 hr tablet Commonly known as:  GLUCOTROL XL Take 1 tablet (10 mg total) by mouth daily.   losartan 25 MG tablet Commonly known as:  COZAAR Take 0.5 tablets (12.5 mg total) by mouth daily.   metolazone 2.5 MG tablet Commonly known as:  ZAROXOLYN Take 1 tablet (2.5 mg total) by mouth daily as needed. For weight and fluid gain. Only if weight dose not come down with an extra Lasix. See discharge orders.   nitroGLYCERIN 0.4 MG SL tablet Commonly known as:  NITROSTAT Place 1 tablet (0.4 mg total) under the tongue every 5 (five) minutes as needed for chest pain.   potassium chloride SA 20 MEQ tablet Commonly known as:  K-DUR,KLOR-CON Take 1 tablet (20 mEq total) by mouth daily.   spironolactone 25 MG tablet Commonly known as:  ALDACTONE Take 0.5 tablets (12.5 mg total) by mouth every evening.   warfarin 5 MG tablet Commonly known as:  COUMADIN Take 1.5 tablets (7.5 mg total) by mouth daily.      Follow-up Information    [facility] Follow up on 06/13/2017.   Specialty:  Cardiology Why:  11:00AM for coumadin folllow up Contact information: [place]       Lendon Colonel, NP Follow up on 06/24/2017.   Specialties:  Nurse Practitioner, Radiology, Cardiology Why:  3:30 pm for TCM hospital follow up you need a Bmet and Mg+ level ordered by cardiology on this date. please take these papers with you and show them.  Contact  information: [place] [address] [phone]        Thompson Grayer, MD Follow up on 06/24/2017.   Specialty:  Cardiology Why:  2:30 pm for eval for ICD Contact information: [address] [place] [address] [phone]          Allergies  Allergen Reactions  . Pork-Derived Products Other (See Comments)    Does not eat for religious reasons - okay with using IV heparin     Procedures/Studies: 2 D ECHO Study Conclusions  - Left ventricle: The cavity size was moderately dilated. Wall   thickness was normal. Systolic function was severely reduced. The   estimated ejection fraction  was in the range of 20% to 25%.   Dyskinesis of the apical myocardium. Akinesis and scarring of the   anteroseptal and anterior myocardium; consistent with infarction   in the distribution of the left anterior descending coronary   artery. Severe hypokinesis of the inferolateral and inferior   myocardium. Doppler parameters are consistent with restrictive   physiology, indicative of decreased left ventricular diastolic   compliance and/or increased left atrial pressure. Acoustic   contrast opacification revealed no evidence ofthrombus. - Mitral valve: There was moderate to severe regurgitation directed   centrally. - Left atrium: The atrium was severely dilated. - Atrial septum: The septum bowed from left to right, consistent   with increased left atrial pressure. No defect or patent foramen   ovale was identified. - Pulmonary arteries: Systolic pressure was mildly increased. PA   peak pressure: 37 mm Hg (S).  Cath 8/13 1. Triple vessel CAD 2. The RCA is a large dominant vessel with a widely patent proximal stent. The distal vessel has mild disease. The PDA has a moderate stenosis, unchanged.  3. The Circumflex is chronically occluded and fills from left to left collaterals.  4. The LAD courses to the apex. The proximal vessel has a widely patent stent. The  mid LAD has a moderate stenosis just beyond an aneurysmal segment. This does not appear to be flow limiting The diagonal branch is moderate in caliber with a patent stent and moderate disease in the mid vessel.  5. Elevated filling pressures.   Dg Chest 2 View  Result Date: 06/06/2017 CLINICAL DATA:  Acute chest pain today. EXAM: CHEST  2 VIEW COMPARISON:  05/18/2017 and prior radiographs FINDINGS: Cardiomegaly noted. There is no evidence of focal airspace disease, pulmonary edema, suspicious pulmonary nodule/mass, pleural effusion, or pneumothorax. No acute bony abnormalities are identified. IMPRESSION: Cardiomegaly without evidence of acute cardiopulmonary disease. Electronically Signed   By: Margarette Canada M.D.   On: 06/06/2017 21:33   Dg Chest 2 View  Result Date: 05/18/2017 CLINICAL DATA:  Intermittent chest pain for 2 days. EXAM: CHEST  2 VIEW COMPARISON:  Most recent radiograph 04/25/2017, multiple priors. FINDINGS: Overlying monitoring devices with life vest in place. Stable to decreased cardiomegaly from prior exam. No pulmonary edema, large pleural effusion, or focal airspace disease. No pneumothorax. Stable osseous structures. IMPRESSION: Stable cardiomegaly without acute abnormality. Electronically Signed   By: Jeb Levering M.D.   On: 05/18/2017 03:34   Ct Angio Chest/abd/pel For Dissection W And/or Wo Contrast  Result Date: 06/07/2017 CLINICAL DATA:  Sharp chest pain radiating to the back this evening. EXAM: CT ANGIOGRAPHY CHEST, ABDOMEN AND PELVIS TECHNIQUE: Multidetector CT imaging through the chest, abdomen and pelvis was performed using the standard protocol during bolus administration of intravenous contrast. Multiplanar reconstructed images and MIPs were obtained and reviewed to evaluate the vascular anatomy. CONTRAST:  100 cc Isovue 370 IV COMPARISON:  None. FINDINGS: CTA CHEST FINDINGS Cardiovascular: No aortic aneurysm or dissection. Three-vessel coronary arteriosclerosis.  Cardiomegaly without pericardial effusion. No acute large central pulmonary embolus. Mediastinum/Nodes: No enlarged mediastinal, hilar, or axillary lymph nodes. Thyroid gland, trachea, and esophagus demonstrate no significant findings. Lungs/Pleura: Minimal centrilobular emphysema in the right upper lobe. Bibasilar atelectasis. No effusion or pneumothorax. No pulmonary consolidation. Musculoskeletal: No chest wall abnormality. No acute or significant osseous findings. Degenerative disc disease and osteophytes along the thoracic spine. Review of the MIP images confirms the above findings. CTA ABDOMEN AND PELVIS FINDINGS VASCULAR Aorta: Mild infrarenal fusiform dilatation of the  abdominal aorta extending to the bifurcation measuring 5.4 cm in craniocaudad dimension by 3.2 cm transverse. Soft plaque is seen along the left lateral wall with ulcerated plaque along its more cephalad margin, series 10, image 62 and series 7, image 165. Celiac: Patent without evidence of aneurysm, dissection, vasculitis or significant stenosis. SMA: Patent without evidence of aneurysm, dissection, vasculitis or significant stenosis. Renals: Single renal arteries bilaterally with calcification at the left renal artery origin but without significant stenosis and minimal calcification of the proximal right renal artery. IMA: Patent Inflow: Ectasia of the right common iliac artery to 2 cm and on the left, 1.5 cm. No focal aneurysm or thrombosis. Mild atherosclerosis of internal and external iliacs. Veins: No obvious venous abnormality within the limitations of this arterial phase study. Review of the MIP images confirms the above findings. NON-VASCULAR Hepatobiliary: No focal liver abnormality is seen. No gallstones, gallbladder wall thickening, or biliary dilatation. Pancreas: Unremarkable. No pancreatic ductal dilatation or surrounding inflammatory changes. Spleen: Normal in size without focal abnormality. Adrenals/Urinary Tract: Mildly  thickened appearing adrenal glands bilaterally which may represent hyperplasia. Cortical thinning and scarring of the lower pole the right kidney laterally. No obstructive uropathy or nephrolithiasis. No definite enhancing mass. The urinary bladder is physiologically distended. Stomach/Bowel: Contracted stomach with normal small bowel rotation. Moderate stool burden within large bowel. Normal appendix. No bowel inflammation or obstruction. Lymphatic: No adenopathy. Reproductive: Normal size prostate. Other: No free air nor free fluid. Musculoskeletal: Degenerative disc disease with vacuum disc phenomenon L3-4 and L4-5. There is a moderate disc bulge at L3-4. Mild dextroconvex curvature the mid lumbar spine. Multilevel degenerative facet arthropathy. Review of the MIP images confirms the above findings. IMPRESSION: 1. No thoracic aortic dissection. 2. 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 3. Ectasia of the common iliac arteries. 4. Degenerative disc disease with vacuum disc phenomenon at L3-4 and L4-5 with moderate-sized broad-based disc bulge at L3-4. 5. Right renal cortical scarring in the lower pole. Electronically Signed   By: Ashley Royalty M.D.   On: 06/07/2017 01:00       Discharge Exam: Vitals:   06/10/17 2100 06/11/17 0457  BP: 122/82 106/70  Pulse:  77  Resp:  16  Temp:  98.3 F (36.8 C)  SpO2:  93%   Vitals:   06/10/17 1410 06/10/17 2059 06/10/17 2100 06/11/17 0457  BP: (!) 125/101 122/82 122/82 106/70  Pulse: 81 87  77  Resp: 18 18  16   Temp: 98 F (36.7 C) 98.9 F (37.2 C)  98.3 F (36.8 C)  TempSrc: Oral Oral  Oral  SpO2: 95% 95%  93%  Weight:    70.4 kg (155 lb 4.8 oz)  Height:        General: Pt is alert, awake, not in acute distress Cardiovascular: RRR, S1/S2 +, no rubs, no gallops Respiratory: CTA bilaterally, no wheezing, no rhonchi Abdominal:  Soft, NT, ND, bowel sounds + Extremities: no edema, no cyanosis    The results of significant diagnostics from this hospitalization (including imaging, microbiology, ancillary and laboratory) are listed below for reference.     Microbiology: No results found for this or any previous visit (from the past 240 hour(s)).   Labs: BNP (last 3 results)  Recent Labs  02/21/17 1302 06/07/17 0425  BNP 1,119.4* 347.4*   Basic Metabolic Panel:  Recent Labs Lab 06/06/17 2112 06/10/17 0428 06/11/17 2595  NA 143 142 142  K 4.1 4.4 3.7  CL 109 108 108  CO2 27 27 27   GLUCOSE 111* 106* 93  BUN 10 17 19   CREATININE 1.19 1.36* 1.42*  CALCIUM 9.1 8.8* 8.8*   Liver Function Tests:  Recent Labs Lab 06/06/17 2112  AST 26  ALT 12*  ALKPHOS 44  BILITOT 0.5  PROT 6.0*  ALBUMIN 3.5   No results for input(s): LIPASE, AMYLASE in the last 168 hours. No results for input(s): AMMONIA in the last 168 hours. CBC:  Recent Labs Lab 06/06/17 2112 06/09/17 0222 06/10/17 0145 06/11/17 0204  WBC 5.8 7.3 7.6 7.7  NEUTROABS 3.8  --   --   --   HGB 12.3* 12.3* 11.8* 11.9*  HCT 38.4* 37.3* 35.6* 35.8*  MCV 81.4 79.4 78.6 78.5  PLT 162 151 149* 155   Cardiac Enzymes:  Recent Labs Lab 06/07/17 0425 06/07/17 0731 06/07/17 1425  TROPONINI 0.14* 0.13* 0.09*   BNP: Invalid input(s): POCBNP CBG:  Recent Labs Lab 06/10/17 1053 06/10/17 1600 06/10/17 2102 06/11/17 0738 06/11/17 1117  GLUCAP 91 106* 134* 97 94   D-Dimer No results for input(s): DDIMER in the last 72 hours. Hgb A1c No results for input(s): HGBA1C in the last 72 hours. Lipid Profile No results for input(s): CHOL, HDL, LDLCALC, TRIG, CHOLHDL, LDLDIRECT in the last 72 hours. Thyroid function studies No results for input(s): TSH, T4TOTAL, T3FREE, THYROIDAB in the last 72 hours.  Invalid input(s): FREET3 Anemia work up No results for input(s): VITAMINB12, FOLATE, FERRITIN, TIBC, IRON, RETICCTPCT in the last 72  hours. Urinalysis No results found for: COLORURINE, APPEARANCEUR, LABSPEC, [place], GLUCOSEU, HGBUR, BILIRUBINUR, KETONESUR, PROTEINUR, UROBILINOGEN, NITRITE, LEUKOCYTESUR Sepsis Labs Invalid input(s): PROCALCITONIN,  WBC,  LACTICIDVEN Microbiology No results found for this or any previous visit (from the past 240 hour(s)).   Time coordinating discharge: Over 30 minutes  SIGNED:   Debbe Odea, MD  [facility] 06/11/2017, 12:16 PM Pager   If 7PM-7AM, please contact night-coverage [email or web address] Password [email or web address]

## 2017-06-11 NOTE — Progress Notes (Signed)
ANTICOAGULATION CONSULT NOTE - Follow Up Consult  Pharmacy Consult for lovenox/coumadin Indication: chest pain/ACS and LV thrombus  Allergies  Allergen Reactions  . Pork-Derived Products Other (See Comments)    Does not eat for religious reasons - okay with using IV heparin    Patient Measurements: Height: 5\' 6"  (167.6 cm) Weight: 155 lb 4.8 oz (70.4 kg) IBW/kg (Calculated) : 63.8 Heparin Dosing Weight: 72 kg  Vital Signs: Temp: 98.3 F (36.8 C) (08/14 0457) Temp Source: Oral (08/14 0457) BP: 106/70 (08/14 0457) Pulse Rate: 77 (08/14 0457)  Labs:  Recent Labs  06/09/17 0222 06/09/17 1111 06/10/17 0145 06/10/17 0428 06/11/17 0204  HGB 12.3*  --  11.8*  --  11.9*  HCT 37.3*  --  35.6*  --  35.8*  PLT 151  --  149*  --  155  LABPROT 20.6*  --  19.2*  --  18.6*  INR 1.74  --  1.59  --  1.54  HEPARINUNFRC 0.27* 0.41 0.38  --  0.17*  CREATININE  --   --   --  1.36* 1.42*    Estimated Creatinine Clearance: 49.9 mL/min (A) (by C-G formula based on SCr of 1.42 mg/dL (H)).   Medications:  Scheduled:  . carvedilol  3.125 mg Oral BID WC  . clopidogrel  75 mg Oral Daily  . diclofenac sodium  4 g Topical QID  . digoxin  0.125 mg Oral Daily  . ezetimibe  10 mg Oral Daily  . furosemide  40 mg Oral BID  . gabapentin  100 mg Oral BID  . insulin aspart  0-5 Units Subcutaneous QHS  . insulin aspart  0-9 Units Subcutaneous TID WC  . losartan  12.5 mg Oral Daily  . sodium chloride flush  3 mL Intravenous Q12H  . spironolactone  12.5 mg Oral QPM  . Warfarin - Physician Dosing Inpatient   Does not apply q1800    Assessment: 61yo male presenting with chest pain on 8/10 with extensive cardiac hx on Coumadin pta for LV thrombus,. He is now s/p cath on 8/13 (not amenable to PCI) on heparin/coumadin. Coumadin 12.5mg  po 8/13 per MD. Change to lovenox today and pharmacy also dosing coumadin.  -INR= 1.59  Home coumadin regimen: 5mg  qday x 7.5mg  MWF   Goal of Therapy:  Heparin  level 0.3-0.7 units/ml Monitor platelets by anticoagulation protocol: Yes   Plan:  Lovenox 70mg  sq q12h Coumadin 7.5mg  po today At discharge would consider coumadin 7.5mg  po 8/14>>8/17 then resume home dose Daily PT/INR while inpatient  Hildred Laser, Pharm D 06/11/2017 10:47 AM

## 2017-06-11 NOTE — Progress Notes (Signed)
Progress Note  Patient Name: Daniel Li Date of Encounter: 06/11/2017  Primary Cardiologist: Dr. Harl Bowie  Subjective   Patient is feeling well; denies chest pain, SOB, and palpitations.   Inpatient Medications    Scheduled Meds: . carvedilol  3.125 mg Oral BID WC  . clopidogrel  75 mg Oral Daily  . diclofenac sodium  4 g Topical QID  . digoxin  0.125 mg Oral Daily  . ezetimibe  10 mg Oral Daily  . furosemide  40 mg Oral BID  . gabapentin  100 mg Oral BID  . insulin aspart  0-5 Units Subcutaneous QHS  . insulin aspart  0-9 Units Subcutaneous TID WC  . losartan  12.5 mg Oral Daily  . sodium chloride flush  3 mL Intravenous Q12H  . spironolactone  12.5 mg Oral QPM  . Warfarin - Physician Dosing Inpatient   Does not apply q1800   Continuous Infusions: . sodium chloride    . heparin 1,300 Units/hr (06/11/17 0507)   PRN Meds: sodium chloride, acetaminophen, ALPRAZolam, hydrALAZINE, morphine injection, nitroGLYCERIN, ondansetron (ZOFRAN) IV, sodium chloride flush, zolpidem   Vital Signs    Vitals:   06/10/17 1410 06/10/17 2059 06/10/17 2100 06/11/17 0457  BP: (!) 125/101 122/82 122/82 106/70  Pulse: 81 87  77  Resp: 18 18  16   Temp: 98 F (36.7 C) 98.9 F (37.2 C)  98.3 F (36.8 C)  TempSrc: Oral Oral  Oral  SpO2: 95% 95%  93%  Weight:    155 lb 4.8 oz (70.4 kg)  Height:        Intake/Output Summary (Last 24 hours) at 06/11/17 1020 Last data filed at 06/11/17 0807  Gross per 24 hour  Intake           847.14 ml  Output             2900 ml  Net         -2052.86 ml   Filed Weights   06/09/17 0554 06/10/17 0423 06/11/17 0457  Weight: 156 lb 4.8 oz (70.9 kg) 157 lb (71.2 kg) 155 lb 4.8 oz (70.4 kg)     Physical Exam   General: Well developed, well nourished, male appearing in no acute distress. Head: Normocephalic, atraumatic.  Neck: Supple without bruits, JVD. Lungs:  Resp regular and unlabored, CTA. Heart: RRR, S1, S2, no S3, S4, or murmur; no  rub. Abdomen: Soft, non-tender, non-distended with normoactive bowel sounds. No hepatomegaly. No rebound/guarding. No obvious abdominal masses. Extremities: No clubbing, cyanosis, trace edema. Distal pedal pulses are 2+ bilaterally. Neuro: Alert and oriented X 3. Moves all extremities spontaneously. Psych: Normal affect.  Labs    Chemistry  Recent Labs Lab 06/06/17 2112 06/10/17 0428 06/11/17 0204  NA 143 142 142  K 4.1 4.4 3.7  CL 109 108 108  CO2 27 27 27   GLUCOSE 111* 106* 93  BUN 10 17 19   CREATININE 1.19 1.36* 1.42*  CALCIUM 9.1 8.8* 8.8*  PROT 6.0*  --   --   ALBUMIN 3.5  --   --   AST 26  --   --   ALT 12*  --   --   ALKPHOS 44  --   --   BILITOT 0.5  --   --   GFRNONAA >60 55* 52*  GFRAA >60 >60 >60  ANIONGAP 7 7 7      Hematology  Recent Labs Lab 06/09/17 0222 06/10/17 0145 06/11/17 0204  WBC 7.3 7.6 7.7  RBC 4.70 4.53 4.56  HGB 12.3* 11.8* 11.9*  HCT 37.3* 35.6* 35.8*  MCV 79.4 78.6 78.5  MCH 26.2 26.0 26.1  MCHC 33.0 33.1 33.2  RDW 15.3 15.1 15.2  PLT 151 149* 155    Cardiac Enzymes  Recent Labs Lab 06/07/17 0425 06/07/17 0731 06/07/17 1425  TROPONINI 0.14* 0.13* 0.09*     Recent Labs Lab 06/06/17 2136 06/07/17 0128  TROPIPOC 0.15* 0.14*     BNP  Recent Labs Lab 06/07/17 0425  BNP 956.1*     DDimer No results for input(s): DDIMER in the last 168 hours.   Radiology    No results found.   Telemetry    Sinus rhythm - Personally Reviewed  ECG    No new tracings - Personally Reviewed  Cardiac Studies   Left heart cath 06/10/17:  Ost 1st Diag to 1st Diag lesion, 0 %stenosed.  Ost 1st Diag lesion, 70 %stenosed.  Ost LAD lesion, 0 %stenosed.  Ost Cx to Prox Cx lesion, 100 %stenosed.  Prox Cx to Mid Cx lesion, 90 %stenosed.  Mid Cx lesion, 90 %stenosed.  Mid LAD lesion, 60 %stenosed.  RPDA lesion, 60 %stenosed.  Dist RCA-1 lesion, 50 %stenosed.  Dist RCA-2 lesion, 100 %stenosed.  Mid RCA lesion, 40  %stenosed.  Ost RCA to Mid RCA lesion, 20 %stenosed.  1st Diag lesion, 50 %stenosed.   1. Triple vessel CAD 2. The RCA is a large dominant vessel with a widely patent proximal stent. The distal vessel has mild disease. The PDA has a moderate stenosis, unchanged.  3. The Circumflex is chronically occluded and fills from left to left collaterals.  4. The LAD courses to the apex. The proximal vessel has a widely patent stent. The mid LAD has a moderate stenosis just beyond an aneurysmal segment. This does not appear to be flow limiting The diagonal branch is moderate in caliber with a patent stent and moderate disease in the mid vessel.  5. Elevated filling pressures.   Recommendations: No focal targets for PCI. Elevated filling pressures. I would suggest diuresis with IV Lasix. I will give one dose of Lasix 40 mg IV x 1 now and another dose tonight. Smoking cessation advised.    Echocardiogram 06/07/17: Study Conclusions - Left ventricle: The cavity size was moderately dilated. Wall   thickness was normal. Systolic function was severely reduced. The   estimated ejection fraction was in the range of 20% to 25%.   Dyskinesis of the apical myocardium. Akinesis and scarring of the   anteroseptal and anterior myocardium; consistent with infarction   in the distribution of the left anterior descending coronary   artery. Severe hypokinesis of the inferolateral and inferior   myocardium. Doppler parameters are consistent with restrictive   physiology, indicative of decreased left ventricular diastolic   compliance and/or increased left atrial pressure. Acoustic   contrast opacification revealed no evidence ofthrombus. - Mitral valve: There was moderate to severe regurgitation directed   centrally. - Left atrium: The atrium was severely dilated. - Atrial septum: The septum bowed from left to right, consistent   with increased left atrial pressure. No defect or patent foramen   ovale was  identified. - Pulmonary arteries: Systolic pressure was mildly increased. PA   peak pressure: 37 mm Hg (S).   Patient Profile     61 y.o. male with a hx of CAD s/p anterior STEMI (02/21/17) s/p , acute on chronic systolic heart failure, ischemic cardiomyopathy, DM, LV thrombus, HTN, HLD, tobacco  abuse, OSA on CPAP who is being seen for evaluation of chest pain.  Assessment & Plan    1. Chest pain, unstable angina, CAD s/p STEMI 01/2017 and s/p DES to LAD - heart catheterization without focal targets for PCI, RCA with patent stent, chronically occluded circumflex, and patent LAD stent (recommendations above) - continue plavix   2. Chronic systolic and diastolic heart failure, ischemic cardiomyopathy - elevated filling pressures during cath - IV diuresis started yesterday: 40 mg IV lasix BID - continue diuresis  - weight is 155 lbs, down from 157 lbs on admission - overall net negative 3.5L with 2.5 L urine output yesterday - no new intervention on cath, should proceed with evaluation of ICD implantation given repeat echo with  - continue lifevest for now   3. LV thrombus - coumadin resumed with lovenox bridge   4. HTN - continue home meds   Signed, Daniel Li , PA-C 10:20 AM 06/11/2017 Pager: [phone]  History and all data above reviewed.  Patient examined.  I agree with the findings as above.  He denies chest pain or SOB.   The patient exam reveals COR:RRR  ,  Lungs: Clear  ,  Abd: Positive bowel sounds, no rebound no guarding, Ext No edema  .  All available labs, radiology testing, previous records reviewed. Agree with documented assessment and plan. OK to discharge.  We have arranged follow up in the EP clinic as he needs an ICD.  I discussed with him the importance of follow up with his appts.    Daniel Li  11:16 AM  06/11/2017

## 2017-06-11 NOTE — Progress Notes (Signed)
ANTICOAGULATION CONSULT NOTE - Follow Up Consult  Pharmacy Consult for heparin Indication: LV thrombus  Labs:  Recent Labs  06/09/17 0222 06/09/17 1111 06/10/17 0145 06/10/17 0428 06/11/17 0204  HGB 12.3*  --  11.8*  --  11.9*  HCT 37.3*  --  35.6*  --  35.8*  PLT 151  --  149*  --  155  LABPROT 20.6*  --  19.2*  --  18.6*  INR 1.74  --  1.59  --  1.54  HEPARINUNFRC 0.27* 0.41 0.38  --  0.17*  CREATININE  --   --   --  1.36* 1.42*    Assessment: 60yo male subtherapeutic on heparin after resumed post-cath; previously therapeutic at this rate but was at low end of goal, may need more time to accumulate.  Goal of Therapy:  Heparin level 0.3-0.7 units/ml   Plan:  Will increase heparin gtt slightly to 1300 units/hr and check level in 6hr.  Wynona Neat, PharmD, BCPS  06/11/2017,4:24 AM

## 2017-06-11 NOTE — Discharge Instructions (Addendum)
No driving for 2 days. No lifting over 5 lbs for 1 week. No sexual activity for 1 week.  Keep procedure site clean & dry. If you notice increased pain, swelling, bleeding or pus, call/return!  You may shower, but no soaking baths/hot tubs/pools for 1 week.   You must weight yourself every day and take and extra 40 mg of Lasix if you gain > 3 lb in 24 hrs. If weight continues to go up, take a Zaroxylin 2.5 mg 30 mg before a dose of Lasix (only once). If weight still continues to go up, call your doctor and be seen in the office.   Please take all your medications with you for your next visit with your Primary MD. Please request your Primary MD to go over all hospital test results at the follow up. Please ask your Primary MD to get all Hospital records sent to his/her office.  If you experience worsening of your admission symptoms, develop shortness of breath, chest pain, suicidal or homicidal thoughts or a life threatening emergency, you must seek medical attention immediately by calling 911 or calling your MD.  Dennis Bast must read the complete instructions/literature along with all the possible adverse reactions/side effects for all the medicines you take including new medications that have been prescribed to you. Take new medicines after you have completely understood and accpet all the possible adverse reactions/side effects.   Do not drive when taking pain medications or sedatives.    Do not take more than prescribed Pain, Sleep and Anxiety Medications  If you have smoked or chewed Tobacco in the last 2 yrs please stop. Stop any regular alcohol and or recreational drug use.  Wear Seat belts while driving.

## 2017-06-11 NOTE — NC FL2 (Signed)
[place] LEVEL OF CARE SCREENING TOOL     IDENTIFICATION  Patient Name: Daniel Li Birthdate: [birth year] Sex: male Admission Date (Current Location): 06/06/2017  [facility] and [place] Number:  Herbalist and Address:  [facility] [place]. [facility], [place] [address] [place], [place] [address]      Provider Number: [id number]  Attending Physician Name and Address:  Debbe Odea, MD  Relative Name and Phone Number:       Current Level of Care: Hospital Recommended Level of Care: [place] Prior Approval Number:    Date Approved/Denied:   PASRR Number:    Discharge Plan: Other (Comment) (ALF - [facility])    Current Diagnoses: Patient Active Problem List   Diagnosis Date Noted  . Acute on chronic systolic CHF (congestive heart failure) ([place])   . Elevated troponin   . LV (left ventricular) mural thrombus with acute MI ([place]) 02/26/2017  . Chronic combined systolic (congestive) and diastolic (congestive) heart failure ([place])   . Acute ST elevation myocardial infarction (STEMI) involving left anterior descending (LAD) coronary artery ([place]) 02/21/2017  . STEMI (ST elevation myocardial infarction) ([place]) 02/21/2017  . Heart attack ([place])   . Acute pulmonary edema (HCC)   . Acute respiratory failure with hypoxia ([place])   . Ischemic cardiomyopathy: EF 35% on cath 06/30/14, new from Jan 2015; Confirmed on Echo with Inferior Hypokinesis 07/20/2014  . HCAP (healthcare-associated pneumonia) 07/19/2014  . Chest pain 07/19/2014  . Unstable angina ([place]) 06/29/2014  . High risk medication use 01/28/2013  . Special screening for malignant neoplasms, colon 01/28/2013  . Hypercholesterolemia   . Tobacco abuse   . Diabetes mellitus type 2 with complications ([place]) 81/19/1478  . Hypertension, essential 03/26/2012  . CAD (coronary artery disease) 03/26/2012    Orientation RESPIRATION BLADDER Height & Weight     Self, Time,  Situation, Place  Normal Continent Weight: 155 lb 4.8 oz (70.4 kg) Height:  5\' 6"  (167.6 cm)  BEHAVIORAL SYMPTOMS/MOOD NEUROLOGICAL BOWEL NUTRITION STATUS      Continent Diet (please see DC summary)  AMBULATORY STATUS COMMUNICATION OF NEEDS Skin    (baseline) Verbally Normal                       Personal Care Assistance Level of Assistance   (baseline)           Functional Limitations Info  Sight, Hearing, Speech Sight Info: Adequate Hearing Info: Adequate Speech Info: Adequate    SPECIAL CARE FACTORS FREQUENCY   (none)                    Contractures Contractures Info: Not present    Additional Factors Info  Code Status, Allergies, Insulin Sliding Scale Code Status Info: Full Allergies Info: pork-derived products   Insulin Sliding Scale Info: insulin 3x/day at meals and at bedtime       Current Medications (06/11/2017):  This is the current hospital active medication list Current Facility-Administered Medications  Medication Dose Route Frequency Provider Last Rate Last Dose  . 0.9 %  sodium chloride infusion  250 mL Intravenous PRN Burnell Blanks, MD      . acetaminophen (TYLENOL) tablet 650 mg  650 mg Oral Q4H PRN Ivor Costa, MD      . ALPRAZolam Duanne Moron) tablet 0.25 mg  0.25 mg Oral BID PRN Ivor Costa, MD   0.25 mg at 06/08/17 1825  . carvedilol (COREG) tablet 3.125  mg  3.125 mg Oral BID WC Ivor Costa, MD   3.125 mg at 06/11/17 0805  . clopidogrel (PLAVIX) tablet 75 mg  75 mg Oral Daily Ivor Costa, MD   75 mg at 06/11/17 0805  . diclofenac sodium (VOLTAREN) 1 % transdermal gel 4 g  4 g Topical QID Debbe Odea, MD   4 g at 06/11/17 0813  . digoxin (LANOXIN) tablet 0.125 mg  0.125 mg Oral Daily Ivor Costa, MD   0.125 mg at 06/11/17 0805  . enoxaparin (LOVENOX) injection 70 mg  70 mg Subcutaneous Q12H Kris Mouton, RPH      . ezetimibe (ZETIA) tablet 10 mg  10 mg Oral Daily Ivor Costa, MD   10 mg at 06/11/17 0805  . furosemide (LASIX) tablet  40 mg  40 mg Oral BID Ledora Bottcher, PA      . gabapentin (NEURONTIN) capsule 100 mg  100 mg Oral BID Ivor Costa, MD   100 mg at 06/11/17 0805  . hydrALAZINE (APRESOLINE) injection 5 mg  5 mg Intravenous Q2H PRN Ivor Costa, MD      . insulin aspart (novoLOG) injection 0-5 Units  0-5 Units Subcutaneous QHS Ivor Costa, MD      . insulin aspart (novoLOG) injection 0-9 Units  0-9 Units Subcutaneous TID WC Ivor Costa, MD   1 Units at 06/09/17 1651  . losartan (COZAAR) tablet 12.5 mg  12.5 mg Oral Daily Ivor Costa, MD   12.5 mg at 06/11/17 0805  . morphine 4 MG/ML injection 2 mg  2 mg Intravenous Q3H PRN Ivor Costa, MD   2 mg at 06/07/17 0323  . nitroGLYCERIN (NITROSTAT) SL tablet 0.4 mg  0.4 mg Sublingual Q5 min PRN Ivor Costa, MD      . ondansetron [facility]) injection 4 mg  4 mg Intravenous Q6H PRN Ivor Costa, MD      . potassium chloride SA (K-DUR,KLOR-CON) CR tablet 20 mEq  20 mEq Oral Daily Minus Breeding, MD   20 mEq at 06/11/17 1244  . sodium chloride flush (NS) 0.9 % injection 3 mL  3 mL Intravenous Q12H Lauree Chandler D, MD      . sodium chloride flush (NS) 0.9 % injection 3 mL  3 mL Intravenous PRN Burnell Blanks, MD      . spironolactone (ALDACTONE) tablet 12.5 mg  12.5 mg Oral QPM Ivor Costa, MD   12.5 mg at 06/10/17 1651  . warfarin (COUMADIN) tablet 7.5 mg  7.5 mg Oral ONCE-1800 Kris Mouton, [facility]      . Warfarin - Physician Dosing Inpatient   Does not apply [id number] Debbe Odea, MD      . zolpidem (AMBIEN) tablet 5 mg  5 mg Oral QHS PRN,MR X 1 Ivor Costa, MD   5 mg at 06/09/17 2120     Discharge Medications: Please see discharge summary for a list of discharge medications.  Relevant Imaging Results:  Relevant Lab Results:   Additional Information SSN: [id number]  Estanislado Emms, LCSW

## 2017-06-11 NOTE — Care Management Note (Signed)
Case Management Note  Patient Details  Name: Daniel Li MRN: [id number] Date of Birth: [birth year]  Subjective/Objective:  Pt presented for Chest Pian- PTA on coumadin for LV thrombus. INR 2.24 on admission. Heparin started on 06-09-17. Cath 06-11-17 revealed diffuse disease- no areas in need for stent. Pt with increased filling pressures- Lasix increased post cath.                   Action/Plan: Plan will be to return to ALF in [place]. Pt uses [facility] in [place] and they deliver to ALF. Pt has battery for life vest. No further needs from CM at this time.   Expected Discharge Date:  06/11/17               Expected Discharge Plan:  Assisted Living / Rest Home  In-House Referral:  Clinical Social Work  Discharge planning Services  CM Consult  Post Acute Care Choice:  NA Choice offered to:  NA  DME Arranged:  N/A DME Agency:  NA  HH Arranged:  NA HH Agency:  NA  Status of Service:  Completed, signed off  If discussed at [employer] of Stay Meetings, dates discussed:    Additional Comments:  Bethena Roys, RN 06/11/2017, 12:44 PM

## 2017-06-11 NOTE — Progress Notes (Signed)
Patient will discharge back to [facility] in [place]. Anticipated discharge date: 06/11/17 Family notified: Alphonzo Lemmings, sister Transportation by: ALF staff, Len Blalock  Nurse to call report to [phone].   CSW signing off.  Estanislado Emms, [place]  Clinical Social Worker

## 2017-06-13 ENCOUNTER — Ambulatory Visit (INDEPENDENT_AMBULATORY_CARE_PROVIDER_SITE_OTHER): Payer: Medicaid Other | Admitting: *Deleted

## 2017-06-13 DIAGNOSIS — I2129 ST elevation (STEMI) myocardial infarction involving other sites: Secondary | ICD-10-CM | POA: Diagnosis not present

## 2017-06-18 ENCOUNTER — Ambulatory Visit (INDEPENDENT_AMBULATORY_CARE_PROVIDER_SITE_OTHER): Payer: Medicaid Other | Admitting: *Deleted

## 2017-06-18 DIAGNOSIS — I2129 ST elevation (STEMI) myocardial infarction involving other sites: Secondary | ICD-10-CM | POA: Diagnosis not present

## 2017-06-18 LAB — POCT INR: INR: 1.7

## 2017-06-18 MED ORDER — WARFARIN SODIUM 5 MG PO TABS: ORAL_TABLET | ORAL | 3 refills | Status: DC

## 2017-06-18 MED ORDER — ENOXAPARIN SODIUM 80 MG/0.8ML ~~LOC~~ SOLN: 70.0000 mg | Freq: Two times a day (BID) | SUBCUTANEOUS | 1 refills | Status: DC

## 2017-06-19 ENCOUNTER — Telehealth: Payer: Self-pay | Admitting: *Deleted

## 2017-06-19 ENCOUNTER — Other Ambulatory Visit: Payer: Self-pay | Admitting: *Deleted

## 2017-06-19 MED ORDER — POTASSIUM CHLORIDE CRYS ER 20 MEQ PO TBCR: 20.0000 meq | EXTENDED_RELEASE_TABLET | Freq: Every day | ORAL | 3 refills | Status: DC

## 2017-06-19 MED ORDER — CARVEDILOL 3.125 MG PO TABS: 3.1250 mg | ORAL_TABLET | Freq: Two times a day (BID) | ORAL | 6 refills | Status: DC

## 2017-06-19 NOTE — Telephone Encounter (Signed)
Nurse overlooked new coumadin order for last night.   Pt should have gotten 12.5mg  warfarin and only got 5mg .  Pt was suppose to get 12.5mg  tonight but I increased dose to 15mg .  He has received lovenox injections as ordered.  He is scheduled for INR check in the morning.

## 2017-06-20 ENCOUNTER — Ambulatory Visit (INDEPENDENT_AMBULATORY_CARE_PROVIDER_SITE_OTHER): Payer: Medicaid Other | Admitting: *Deleted

## 2017-06-20 DIAGNOSIS — I2129 ST elevation (STEMI) myocardial infarction involving other sites: Secondary | ICD-10-CM

## 2017-06-20 LAB — POCT INR: INR: 2.7

## 2017-06-24 ENCOUNTER — Telehealth: Payer: Self-pay | Admitting: Cardiology

## 2017-06-24 ENCOUNTER — Institutional Professional Consult (permissible substitution): Payer: Medicaid Other | Admitting: Internal Medicine

## 2017-06-24 ENCOUNTER — Ambulatory Visit: Payer: Medicaid Other | Admitting: Adult Health

## 2017-06-24 NOTE — Telephone Encounter (Signed)
Caretaker has questions regarding Repatha/tg

## 2017-06-25 ENCOUNTER — Encounter: Payer: Self-pay | Admitting: Internal Medicine

## 2017-06-25 NOTE — Telephone Encounter (Signed)
Returned pt call. No answer, left message for pt to return call.  

## 2017-06-27 ENCOUNTER — Ambulatory Visit (INDEPENDENT_AMBULATORY_CARE_PROVIDER_SITE_OTHER): Payer: Medicaid Other | Admitting: *Deleted

## 2017-06-27 DIAGNOSIS — I2129 ST elevation (STEMI) myocardial infarction involving other sites: Secondary | ICD-10-CM | POA: Diagnosis not present

## 2017-06-27 LAB — POCT INR: INR: 2.6

## 2017-07-02 ENCOUNTER — Telehealth (HOSPITAL_COMMUNITY): Payer: Self-pay | Admitting: Vascular Surgery

## 2017-07-02 NOTE — Telephone Encounter (Signed)
Left pt message to make f/u appt w/ [facility] 

## 2017-07-02 NOTE — Telephone Encounter (Signed)
Daniel Li called in reference to needing information about shots

## 2017-07-02 NOTE — Telephone Encounter (Signed)
Called pt. No answer, left message for pt. To return call.  

## 2017-07-02 NOTE — Telephone Encounter (Signed)
Please call updated phone number from last phone note.  Glenard Haring at [facility]

## 2017-07-02 NOTE — Telephone Encounter (Signed)
Spoke with Levada Dy from [facility] and requesting we send Repatha instructions (approved on 06/04/17) - send LOV with Dr Harl Bowie with instructions

## 2017-07-12 ENCOUNTER — Other Ambulatory Visit: Payer: Self-pay | Admitting: *Deleted

## 2017-07-12 ENCOUNTER — Ambulatory Visit: Payer: Medicaid Other | Admitting: Cardiology

## 2017-07-12 MED ORDER — WARFARIN SODIUM 5 MG PO TABS: 7.5000 mg | ORAL_TABLET | Freq: Every day | ORAL | 1 refills | Status: DC

## 2017-07-15 ENCOUNTER — Encounter (HOSPITAL_COMMUNITY): Payer: Self-pay | Admitting: Cardiology

## 2017-07-15 ENCOUNTER — Encounter (HOSPITAL_COMMUNITY): Payer: Medicaid Other | Admitting: Cardiology

## 2017-07-15 ENCOUNTER — Ambulatory Visit (HOSPITAL_COMMUNITY)
Admission: RE | Admit: 2017-07-15 | Discharge: 2017-07-15 | Disposition: A | Discharge status: Home / Self Care | Payer: Medicaid Other | Source: Ambulatory Visit | Attending: Cardiology | Admitting: Cardiology

## 2017-07-15 ENCOUNTER — Encounter (HOSPITAL_COMMUNITY): Payer: Self-pay

## 2017-07-15 VITALS — BP 94/75 | HR 86 | Wt 168.8 lb

## 2017-07-15 DIAGNOSIS — I110 Hypertensive heart disease with heart failure: Secondary | ICD-10-CM | POA: Insufficient documentation

## 2017-07-15 DIAGNOSIS — Z8673 Personal history of transient ischemic attack (TIA), and cerebral infarction without residual deficits: Secondary | ICD-10-CM | POA: Insufficient documentation

## 2017-07-15 DIAGNOSIS — I2510 Atherosclerotic heart disease of native coronary artery without angina pectoris: Secondary | ICD-10-CM | POA: Diagnosis not present

## 2017-07-15 DIAGNOSIS — E119 Type 2 diabetes mellitus without complications: Secondary | ICD-10-CM | POA: Diagnosis not present

## 2017-07-15 DIAGNOSIS — Z7902 Long term (current) use of antithrombotics/antiplatelets: Secondary | ICD-10-CM | POA: Insufficient documentation

## 2017-07-15 DIAGNOSIS — Z7901 Long term (current) use of anticoagulants: Secondary | ICD-10-CM | POA: Diagnosis not present

## 2017-07-15 DIAGNOSIS — Z8249 Family history of ischemic heart disease and other diseases of the circulatory system: Secondary | ICD-10-CM | POA: Insufficient documentation

## 2017-07-15 DIAGNOSIS — I2129 ST elevation (STEMI) myocardial infarction involving other sites: Secondary | ICD-10-CM | POA: Diagnosis not present

## 2017-07-15 DIAGNOSIS — Z7984 Long term (current) use of oral hypoglycemic drugs: Secondary | ICD-10-CM | POA: Diagnosis not present

## 2017-07-15 DIAGNOSIS — Z823 Family history of stroke: Secondary | ICD-10-CM | POA: Insufficient documentation

## 2017-07-15 DIAGNOSIS — I5022 Chronic systolic (congestive) heart failure: Secondary | ICD-10-CM

## 2017-07-15 DIAGNOSIS — I5042 Chronic combined systolic (congestive) and diastolic (congestive) heart failure: Secondary | ICD-10-CM

## 2017-07-15 DIAGNOSIS — Z79899 Other long term (current) drug therapy: Secondary | ICD-10-CM | POA: Insufficient documentation

## 2017-07-15 DIAGNOSIS — M79606 Pain in leg, unspecified: Secondary | ICD-10-CM | POA: Insufficient documentation

## 2017-07-15 DIAGNOSIS — I5023 Acute on chronic systolic (congestive) heart failure: Secondary | ICD-10-CM | POA: Diagnosis not present

## 2017-07-15 DIAGNOSIS — Z87891 Personal history of nicotine dependence: Secondary | ICD-10-CM | POA: Insufficient documentation

## 2017-07-15 DIAGNOSIS — I252 Old myocardial infarction: Secondary | ICD-10-CM | POA: Insufficient documentation

## 2017-07-15 LAB — BASIC METABOLIC PANEL
Anion gap: 7 (ref 5–15)
BUN: 12 mg/dL (ref 6–20)
CALCIUM: 9.5 mg/dL (ref 8.9–10.3)
CO2: 31 mmol/L (ref 22–32)
CREATININE: 1.18 mg/dL (ref 0.61–1.24)
Chloride: 101 mmol/L (ref 101–111)
Glucose, Bld: 47 mg/dL — ABNORMAL LOW (ref 65–99)
Potassium: 4.2 mmol/L (ref 3.5–5.1)
SODIUM: 139 mmol/L (ref 135–145)

## 2017-07-15 LAB — PROTIME-INR
INR: 2.38
PROTHROMBIN TIME: 25.8 s — AB (ref 11.4–15.2)

## 2017-07-15 LAB — LIPID PANEL
Cholesterol: 225 mg/dL — ABNORMAL HIGH (ref 0–200)
HDL: 48 mg/dL (ref >40)
LDL CALC: 123 mg/dL — AB (ref 0–99)
Total CHOL/HDL Ratio: 4.7 RATIO
Triglycerides: 268 mg/dL — ABNORMAL HIGH (ref <150)
VLDL: 54 mg/dL — ABNORMAL HIGH (ref 0–40)

## 2017-07-15 LAB — DIGOXIN LEVEL: Digoxin Level: <0.2 ng/mL — ABNORMAL LOW (ref 0.8–2.0)

## 2017-07-15 MED ORDER — FUROSEMIDE 40 MG PO TABS: ORAL_TABLET | ORAL | 6 refills | Status: DC

## 2017-07-15 MED ORDER — SPIRONOLACTONE 25 MG PO TABS: 25.0000 mg | ORAL_TABLET | Freq: Every evening | ORAL | 6 refills | Status: DC

## 2017-07-15 NOTE — Progress Notes (Signed)
Advanced Heart Failure Clinic Note   Primary Cardiologist: Dr. Harl Bowie Primary HF: Dr. Aundra Dubin   HPI:  Daniel Li is a 61 y.o. male with long history of CAD s/p multiple ACS episodes, HTN, HLD, tobacco abuse, DM, OSA, CVA and systolic CHF.   He has a history of CAD with NSTEMI in 2007 with BMS placement to his D1 and OM2. Also with history of STEMI in May 2013 at which time he had a BMS placed to his RCA. NSTEMI in January of 2014, cath showed CTO of left circumflex, unable to intervene upon. EF 55-60% in September 2016. Anterior STEMI 4/18, DES to pLAD.   Recently admitted 8/9 - 06/11/17 with chest pain. Cath as below. No targets for PCI, LAD stent patent. EF remained low at 20-25% as below. Planned for ICD consideration.   He presents today for post hospital follow up. Pt is a poor historian, and continuously goes off on tangents seemingly unrelated to his current situation or symptoms. He feels OK. He states he has chest pain most days. Describes it as a pressure like someone setting a "cinder-block" on his chest. It usually improves with NTG and tramadol, but he finds it hard to get to sleep. It can also be improved or worsened by positioning. He does complain of DOE. He has SOB with bathing or changing clothes. He occasionally has peripheral edema. He still has a lifevest, he just forgot to put it on this morning.   ECG (personally reviewed, 8/18): NSR, old ASMI, diffuse TWIs  Labs (8/18): K 3.7, creatinine 1.42  Review of systems complete and found to be negative unless listed in HPI.   Past Medical History 1. OSA: Supposed to be using CPAP.  2. HTN 3. CAD: Long history.  - NSTMEI 2007 with BMS D1 and OM2. - STEMI 5/13 with BMS RCA - NSTEMI 1/14 with CTO LCx and PLV, unable to open LCx.  - STEMI 4/18 with LHC showing old TO LCx with collaterals, old TO PLV, 70% ostial D1, totally occluded proximal LAD with some collaterals => DES to LAD.  - LHC 8/18 with old TO LCx, old TO  PLV, 60% mLAD, patent LAD stent.  4. Depression 5. Type II DM 6. Hyperlipidemia: History of rhabdomyolysis with statins, he is on Zetia.  7. Smoker 8. Chronic systolic CHF: Ischemic cardiomyopathy.  Echo from 9/16 showed improvement in EF to 55-60%.   - Echo (4/18) with EF 20-25%, dyskinetic apex, moderate-severe MR, severe LAE.  9. Leg pain/cramps: history of rhabdomyolysis in setting of statin use.  - ABIs (4/18) with R ABI of 0.84 suggestive of mild disease.  L ABI of 1.27 (Normal flow at rest) 10. LV thrombus   Current Outpatient Prescriptions  Medication Sig Dispense Refill  . acetaminophen (TYLENOL) 325 MG tablet Take 2 tablets (650 mg total) by mouth every 4 (four) hours as needed for headache or mild pain.    . carvedilol (COREG) 3.125 MG tablet Take 1 tablet (3.125 mg total) by mouth 2 (two) times daily with a meal. 60 tablet 6  . clopidogrel (PLAVIX) 75 MG tablet Take 1 tablet (75 mg total) by mouth daily. 30 tablet 3  . digoxin (LANOXIN) 0.125 MG tablet Take 1 tablet (0.125 mg total) by mouth daily. 30 tablet 3  . Evolocumab (REPATHA [place]) Inject 140 mg into the skin every 21 ( twenty-one) days.    [name] Kitchen ezetimibe (ZETIA) 10 MG tablet Take 1 tablet (10 mg total) by mouth daily. [place]  tablet 3  . furosemide (LASIX) 40 MG tablet Take 1 tablet (40 mg total) by mouth 2 (two) times daily. 60 tablet 3  . gabapentin (NEURONTIN) 100 MG capsule Take 1 capsule (100 mg total) by mouth 2 (two) times daily. 60 capsule 0  . glipiZIDE (GLUCOTROL XL) 10 MG 24 hr tablet Take 1 tablet (10 mg total) by mouth daily. 30 tablet 1  . losartan (COZAAR) 25 MG tablet Take 0.5 tablets (12.5 mg total) by mouth daily. 15 tablet 3  . nitroGLYCERIN (NITROSTAT) 0.4 MG SL tablet Place 1 tablet (0.4 mg total) under the tongue every 5 (five) minutes as needed for chest pain. 25 tablet 3  . potassium chloride SA (K-DUR,KLOR-CON) 20 MEQ tablet Take 1 tablet (20 mEq total) by mouth daily. 30 tablet 3  . spironolactone  (ALDACTONE) 25 MG tablet Take 0.5 tablets (12.5 mg total) by mouth every evening. 15 tablet 3  . warfarin (COUMADIN) 5 MG tablet Take 1.5 tablets (7.5 mg total) by mouth daily. Take as directed by coumadin clinic 45 tablet 1  . metolazone (ZAROXOLYN) 2.5 MG tablet Take 1 tablet (2.5 mg total) by mouth daily as needed. For weight and fluid gain. Only if weight dose not come down with an extra Lasix. See discharge orders. (Patient not taking: Reported on 07/15/2017) 30 tablet 11   No current facility-administered medications for this encounter.     Allergies  Allergen Reactions  . Pork-Derived Products Other (See Comments)    Does not eat for religious reasons - okay with using IV heparin      Social History   Social History  . Marital status: Single    Spouse name: N/A  . Number of children: 1  . Years of education: N/A   Occupational History  . Not on file.   Social History Main Topics  . Smoking status: Former Smoker    Packs/day: 0.50    Years: 20.00    Types: Cigarettes    Start date: 07/21/1977    Quit date: 10/29/2014  . Smokeless tobacco: Never Used     Comment: cutting back to 1 or 2 cigarettes every 3 days  . Alcohol use 1.8 oz/week    3 Cans of beer per week     Comment: 16 ounce per day  . Drug use: No     Comment: previously incarcerated for drug related offense.  [name] Kitchen Sexual activity: Not Currently   Other Topics Concern  . Not on file   Social History Narrative  . No narrative on file      Family History  Problem Relation Age of Onset  . Stroke Mother   . Heart attack Mother   . Heart attack Father   . Stroke Sister   . Heart attack Sister   . Heart attack Brother   . Stroke Brother   . Liver disease Neg Hx   . Colon cancer Neg Hx     Vitals:   07/15/17 1410  BP: 94/75  Pulse: 86  SpO2: 99%  Weight: 168 lb 12 oz (76.5 kg)   Wt Readings from Last 3 Encounters:  07/15/17 168 lb 12 oz (76.5 kg)  06/11/17 155 lb 4.8 oz (70.4 kg)  04/25/17 158  lb 9.6 oz (71.9 kg)   PHYSICAL EXAM: General:  Elderly appearing. NAD HEENT: normal Neck: supple. JVD 9-10 cm with mild HJR. Carotids 2+ bilat; no bruits. No lymphadenopathy or thyromegaly appreciated. Cor: PMI nondisplaced. Regular rate & rhythm. No rubs,  gallops or murmurs. Lungs: clear Abdomen: soft, nontender, nondistended. No hepatosplenomegaly. No bruits or masses. Good bowel sounds. Extremities: no cyanosis, clubbing, rash, or edema. Neuro: alert & oriented x 3, cranial nerves grossly intact. moves all 4 extremities w/o difficulty. Affect pleasant.  ASSESSMENT & PLAN:  1. CAD: Recent anterior STEMI 4/18. Extensive prior history of CAD. He has known total occlusion LCx and PLV, DES to totally occluded LAD admission 01/2017.  - Cath 06/10/17 as above with no PCI targets with chronically occluded circumflex with collaterals, LAD stent patent. - He continues to have atypical chest pain. No clear targets on recent cath. No indication for additional work up at this time.  - Continue coumadin with LV thrombus.  - Continue Plavix.  - He completed 1 month of ASA. No further.  - No statin given history of rhabdomyolysis. LDL 153. - Continue Zetia - Start Repatha.   2. Acute on chronic systolic KTG:YBWLSLHT cardiomyoapthy. EF 25% on LV-gram, LVEDP 38 on cath 4/26. Pulmonary edema on admission CXR. Echo 8/18 with EF 20%, diffuse hypokinesis, LV thrombus noted, normal RV, moderate MR. 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 Volume status stable.  - NYHA III-IIIb. - Volume status elevated on exam.  - Increase lasix to 80 mg q am and 40 mg q pm. BMET today and repeat 10 days.  - Continue low dose coreg 3.125 mg BID - Continue losartan 12.5 mg daily.  - Increase spironolactone to 25 mg qhs.  - Continue Digoxin 0.125mg . Level today.  - His repeat Echo in 8/18 showed chronically depressed EF. Will refer to EP for ICD eval.   3. Diabetes:  - Per PCP.     4. Smoking:  - Encouraged complete cessation.   5. Leg pain/cramps:  - Bilateral. Has history of rhabdomyolysis. CK normal.  - ABIs with R ABI of 0.84 suggestive of mild disease. And L ABI of 1.27 (Normal flow at rest) - Avoiding statins.   6. LV thrombus:  - On coumadin as above.  - Will check INR today. (INR appt last week cancelled due to weather and not yet rescheduled.)  7. CVA - H/o multiple CVAs - on coumadin as above.  Shirley Friar, PA-C 07/15/17   Patient seen with PA, agree with the above note.    Patient has some volume on exam.  Will increase Lasix to 80 qam/40 qpm.  Not much BP room for med titration, but will increase spironolactone to 25 mg daily.  Will need BMET today and again in 10 days.   He has persistently low EF and ischemic cardiomyopathy, will refer for ICD.  Not CRT candidate.  Would prefer [employer] device for [employer].    He is on warfarin for LV apical thrombus and Plavix for stent in 4/18.  Not on ASA.   Unable to take statins with history of rhabdomyolysis.  He is on Zetia.  Will check lipids today, refer to lipid clinic for [place].   [name] 07/16/2017

## 2017-07-15 NOTE — Patient Instructions (Addendum)
STOP aspirin.  Will contact you regarding Repatha medication and how to start.  INCREASE Lasix to 80 mg (2 tabs) in ama and 40 mg (1 tab) in pm.  INCREASE Spironolactone 25 mg (1 whole tablet) once daily.  Will refer you to electrophysiology at [facility]. Address: [address] ([place]), [place],  [address]  Phone: [phone]  Routine lab work today. Will notify you of abnormal results, otherwise no news is good news!  Follow up 1-2 weeks for labs.  ________________________________________________________________  ________________________________________________________________  Follow up 3-4 weeks for appointment.  _________________________________________________________________  _________________________________________________________________  Take all medication as prescribed the day of your appointment. Bring all medications with you to your appointment.  Do the following things EVERYDAY: 1) Weigh yourself in the morning before breakfast. Write it down and keep it in a log. 2) Take your medicines as prescribed 3) Eat low salt foods-Limit salt (sodium) to 2000 mg per day.  4) Stay as active as you can everyday 5) Limit all fluids for the day to less than 2 liters

## 2017-07-16 ENCOUNTER — Telehealth: Payer: Self-pay | Admitting: Pharmacist

## 2017-07-16 NOTE — Telephone Encounter (Signed)
Referral from Dr Aundra Dubin for [place] therapy. Submitted prior authorization today to see if pt's insurance will cover [place].

## 2017-07-18 ENCOUNTER — Ambulatory Visit (INDEPENDENT_AMBULATORY_CARE_PROVIDER_SITE_OTHER): Payer: Medicaid Other | Admitting: *Deleted

## 2017-07-18 DIAGNOSIS — Z5181 Encounter for therapeutic drug level monitoring: Secondary | ICD-10-CM | POA: Diagnosis not present

## 2017-07-18 DIAGNOSIS — I2129 ST elevation (STEMI) myocardial infarction involving other sites: Secondary | ICD-10-CM | POA: Diagnosis not present

## 2017-07-18 LAB — POCT INR: INR: 2.3

## 2017-07-19 ENCOUNTER — Telehealth: Payer: Self-pay | Admitting: *Deleted

## 2017-07-19 ENCOUNTER — Other Ambulatory Visit: Payer: Self-pay | Admitting: Pharmacist

## 2017-07-19 MED ORDER — EVOLOCUMAB 140 MG/ML ~~LOC~~ SOAJ: 1.0000 "pen " | SUBCUTANEOUS | 11 refills | Status: DC

## 2017-07-19 NOTE — Telephone Encounter (Signed)
Opened in error

## 2017-07-24 ENCOUNTER — Encounter: Payer: Self-pay | Admitting: Cardiology

## 2017-07-25 ENCOUNTER — Ambulatory Visit (HOSPITAL_COMMUNITY)
Admission: RE | Admit: 2017-07-25 | Discharge: 2017-07-25 | Disposition: A | Discharge status: Home / Self Care | Payer: Medicaid Other | Source: Ambulatory Visit | Attending: Internal Medicine | Admitting: Internal Medicine

## 2017-07-25 DIAGNOSIS — I5042 Chronic combined systolic (congestive) and diastolic (congestive) heart failure: Secondary | ICD-10-CM | POA: Diagnosis present

## 2017-07-25 LAB — BASIC METABOLIC PANEL
Anion gap: 10 (ref 5–15)
BUN: 11 mg/dL (ref 6–20)
CHLORIDE: 98 mmol/L — AB (ref 101–111)
CO2: 29 mmol/L (ref 22–32)
CREATININE: 1.12 mg/dL (ref 0.61–1.24)
Calcium: 9.4 mg/dL (ref 8.9–10.3)
GFR calc non Af Amer: >60 mL/min (ref >60)
Glucose, Bld: 95 mg/dL (ref 65–99)
POTASSIUM: 4.3 mmol/L (ref 3.5–5.1)
Sodium: 137 mmol/L (ref 135–145)

## 2017-07-30 ENCOUNTER — Ambulatory Visit: Payer: Medicaid Other | Admitting: Cardiology
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
## 2017-08-01 ENCOUNTER — Ambulatory Visit (INDEPENDENT_AMBULATORY_CARE_PROVIDER_SITE_OTHER): Payer: Medicaid Other | Admitting: Pharmacist

## 2017-08-01 DIAGNOSIS — E7800 Pure hypercholesterolemia, unspecified: Secondary | ICD-10-CM

## 2017-08-01 NOTE — Patient Instructions (Signed)
It was nice to meet you today.  Continue with your Repatha injections (started today in clinic).  Continue all of your other medications.  Your next Repatha injection is due on 10/18. The injections are given every 14 days. Keep the injections in the fridge until they are ready to be used.  We will recheck labs at one of your future visits with the heart failure clinic.

## 2017-08-14 ENCOUNTER — Ambulatory Visit (HOSPITAL_COMMUNITY)
Admission: RE | Admit: 2017-08-14 | Discharge: 2017-08-14 | Disposition: A | Discharge status: Home / Self Care | Payer: Medicaid Other | Source: Ambulatory Visit | Attending: Cardiology | Admitting: Cardiology

## 2017-08-14 ENCOUNTER — Encounter (HOSPITAL_COMMUNITY): Payer: Self-pay

## 2017-08-14 VITALS — BP 102/80 | HR 88 | Wt 171.8 lb

## 2017-08-14 DIAGNOSIS — Z7901 Long term (current) use of anticoagulants: Secondary | ICD-10-CM | POA: Insufficient documentation

## 2017-08-14 DIAGNOSIS — Z79899 Other long term (current) drug therapy: Secondary | ICD-10-CM | POA: Insufficient documentation

## 2017-08-14 DIAGNOSIS — I110 Hypertensive heart disease with heart failure: Secondary | ICD-10-CM | POA: Insufficient documentation

## 2017-08-14 DIAGNOSIS — F17210 Nicotine dependence, cigarettes, uncomplicated: Secondary | ICD-10-CM | POA: Insufficient documentation

## 2017-08-14 DIAGNOSIS — M6282 Rhabdomyolysis: Secondary | ICD-10-CM | POA: Insufficient documentation

## 2017-08-14 DIAGNOSIS — G4733 Obstructive sleep apnea (adult) (pediatric): Secondary | ICD-10-CM | POA: Insufficient documentation

## 2017-08-14 DIAGNOSIS — I10 Essential (primary) hypertension: Secondary | ICD-10-CM | POA: Diagnosis not present

## 2017-08-14 DIAGNOSIS — E119 Type 2 diabetes mellitus without complications: Secondary | ICD-10-CM | POA: Insufficient documentation

## 2017-08-14 DIAGNOSIS — I5023 Acute on chronic systolic (congestive) heart failure: Secondary | ICD-10-CM | POA: Diagnosis not present

## 2017-08-14 DIAGNOSIS — F329 Major depressive disorder, single episode, unspecified: Secondary | ICD-10-CM | POA: Insufficient documentation

## 2017-08-14 DIAGNOSIS — I255 Ischemic cardiomyopathy: Secondary | ICD-10-CM | POA: Diagnosis not present

## 2017-08-14 DIAGNOSIS — I2582 Chronic total occlusion of coronary artery: Secondary | ICD-10-CM | POA: Insufficient documentation

## 2017-08-14 DIAGNOSIS — Z7902 Long term (current) use of antithrombotics/antiplatelets: Secondary | ICD-10-CM | POA: Insufficient documentation

## 2017-08-14 DIAGNOSIS — E785 Hyperlipidemia, unspecified: Secondary | ICD-10-CM | POA: Insufficient documentation

## 2017-08-14 DIAGNOSIS — I5042 Chronic combined systolic (congestive) and diastolic (congestive) heart failure: Secondary | ICD-10-CM

## 2017-08-14 DIAGNOSIS — Z8673 Personal history of transient ischemic attack (TIA), and cerebral infarction without residual deficits: Secondary | ICD-10-CM | POA: Diagnosis not present

## 2017-08-14 DIAGNOSIS — R0789 Other chest pain: Secondary | ICD-10-CM | POA: Insufficient documentation

## 2017-08-14 DIAGNOSIS — I2510 Atherosclerotic heart disease of native coronary artery without angina pectoris: Secondary | ICD-10-CM | POA: Diagnosis not present

## 2017-08-14 DIAGNOSIS — I252 Old myocardial infarction: Secondary | ICD-10-CM | POA: Insufficient documentation

## 2017-08-14 DIAGNOSIS — Z720 Tobacco use: Secondary | ICD-10-CM | POA: Diagnosis not present

## 2017-08-14 DIAGNOSIS — M79605 Pain in left leg: Secondary | ICD-10-CM | POA: Diagnosis not present

## 2017-08-14 DIAGNOSIS — Z955 Presence of coronary angioplasty implant and graft: Secondary | ICD-10-CM | POA: Diagnosis not present

## 2017-08-14 DIAGNOSIS — M79604 Pain in right leg: Secondary | ICD-10-CM | POA: Diagnosis not present

## 2017-08-14 LAB — CBC
HCT: 39.6 % (ref 39.0–52.0)
HEMOGLOBIN: 13 g/dL (ref 13.0–17.0)
MCH: 25.8 pg — ABNORMAL LOW (ref 26.0–34.0)
MCHC: 32.8 g/dL (ref 30.0–36.0)
MCV: 78.6 fL (ref 78.0–100.0)
PLATELETS: 218 10*3/uL (ref 150–400)
RBC: 5.04 MIL/uL (ref 4.22–5.81)
RDW: 15.4 % (ref 11.5–15.5)
WBC: 6.9 10*3/uL (ref 4.0–10.5)

## 2017-08-14 LAB — BASIC METABOLIC PANEL
ANION GAP: 6 (ref 5–15)
BUN: 13 mg/dL (ref 6–20)
CALCIUM: 9.2 mg/dL (ref 8.9–10.3)
CO2: 28 mmol/L (ref 22–32)
Chloride: 103 mmol/L (ref 101–111)
Creatinine, Ser: 1.04 mg/dL (ref 0.61–1.24)
GLUCOSE: 102 mg/dL — AB (ref 65–99)
Potassium: 3.8 mmol/L (ref 3.5–5.1)
Sodium: 137 mmol/L (ref 135–145)

## 2017-08-14 NOTE — Progress Notes (Signed)
Advanced Heart Failure Clinic Note   Primary Cardiologist: Dr. Harl Bowie Primary HF: Dr. Aundra Dubin   HPI:  Daniel Li is a 61 y.o. male with long history of CAD s/p multiple ACS episodes, HTN, HLD, tobacco abuse, DM, OSA, CVA and systolic CHF.   He has a history of CAD with NSTEMI in 2007 with BMS placement to his D1 and OM2. Also with history of STEMI in May 2013 at which time he had a BMS placed to his RCA. NSTEMI in January of 2014, cath showed CTO of left circumflex, unable to intervene upon. EF 55-60% in September 2016. Anterior STEMI 4/18, DES to pLAD.   Recently admitted 8/9 - 06/11/17 with chest pain. Cath as below. No targets for PCI, LAD stent patent. EF remained low at 20-25% as below. Planned for ICD consideration.   He presents today for regular follow up. At last visit EP referral made. He has not heard from them, though pt is located at [facility], and only his home number is on the chart. He has been overall feeling OK.  He continues to have atypical chest pain. He states it is now mostly related to food (after dinner) and sometimes when lying flat. Denies SOB with ADLs. Mild SOB with climbing stairs or an incline. He still has his lifevest, but "forgot" to wear it this morning. He is lightheaded with rapid standing, worse in the mornings. Some mornings he will have to balance himself along a wall to walk down a hall first thing in the am. Denies near syncope. Continues to smoke but "trying to cut back".  Review of systems complete and found to be negative unless listed in HPI.    Past Medical History 1. OSA: Supposed to be using CPAP.  2. HTN 3. CAD: Long history.  - NSTMEI 2007 with BMS D1 and OM2. - STEMI 5/13 with BMS RCA - NSTEMI 1/14 with CTO LCx and PLV, unable to open LCx.  - STEMI 4/18 with LHC showing old TO LCx with collaterals, old TO PLV, 70% ostial D1, totally occluded proximal LAD with some collaterals => DES to LAD.  - LHC 8/18 with old TO  LCx, old TO PLV, 60% mLAD, patent LAD stent.  4. Depression 5. Type II DM 6. Hyperlipidemia: History of rhabdomyolysis with statins, he is on Zetia.  7. Smoker 8. Chronic systolic CHF: Ischemic cardiomyopathy.  Echo from 9/16 showed improvement in EF to 55-60%.   - Echo (4/18) with EF 20-25%, dyskinetic apex, moderate-severe MR, severe LAE.  9. Leg pain/cramps: history of rhabdomyolysis in setting of statin use.  - ABIs (4/18) with R ABI of 0.84 suggestive of mild disease.  L ABI of 1.27 (Normal flow at rest) 10. LV thrombus   Current Outpatient Prescriptions  Medication Sig Dispense Refill  . acetaminophen (TYLENOL) 325 MG tablet Take 2 tablets (650 mg total) by mouth every 4 (four) hours as needed for headache or mild pain.    . carvedilol (COREG) 3.125 MG tablet Take 1 tablet (3.125 mg total) by mouth 2 (two) times daily with a meal. 60 tablet 6  . clopidogrel (PLAVIX) 75 MG tablet Take 1 tablet (75 mg total) by mouth daily. 30 tablet 3  . digoxin (LANOXIN) 0.125 MG tablet Take 1 tablet (0.125 mg total) by mouth daily. 30 tablet 3  . Evolocumab (REPATHA SURECLICK) 161 MG/ML SOAJ Inject 1 pen into the skin every 14 (fourteen) days. 2 pen 11  . ezetimibe (ZETIA) 10 MG tablet  Take 1 tablet (10 mg total) by mouth daily. 30 tablet 3  . furosemide (LASIX) 40 MG tablet Take 80 mg (2 tabs) in am and 40 mg (1 tab) in pm 90 tablet 6  . gabapentin (NEURONTIN) 100 MG capsule Take 1 capsule (100 mg total) by mouth 2 (two) times daily. 60 capsule 0  . glipiZIDE (GLUCOTROL XL) 10 MG 24 hr tablet Take 1 tablet (10 mg total) by mouth daily. 30 tablet 1  . losartan (COZAAR) 25 MG tablet Take 0.5 tablets (12.5 mg total) by mouth daily. 15 tablet 3  . metolazone (ZAROXOLYN) 2.5 MG tablet Take 1 tablet (2.5 mg total) by mouth daily as needed. For weight and fluid gain. Only if weight dose not come down with an extra Lasix. See discharge orders. (Patient not taking: Reported on 07/15/2017) 30 tablet 11  .  nitroGLYCERIN (NITROSTAT) 0.4 MG SL tablet Place 1 tablet (0.4 mg total) under the tongue every 5 (five) minutes as needed for chest pain. 25 tablet 3  . potassium chloride SA (K-DUR,KLOR-CON) 20 MEQ tablet Take 1 tablet (20 mEq total) by mouth daily. 30 tablet 3  . spironolactone (ALDACTONE) 25 MG tablet Take 1 tablet (25 mg total) by mouth every evening. 30 tablet 6  . warfarin (COUMADIN) 5 MG tablet Take 1.5 tablets (7.5 mg total) by mouth daily. Take as directed by coumadin clinic 45 tablet 1   No current facility-administered medications for this encounter.     Allergies  Allergen Reactions  . Pork-Derived Products Other (See Comments)    Does not eat for religious reasons - okay with using IV heparin      Social History   Social History  . Marital status: Single    Spouse name: N/A  . Number of children: 1  . Years of education: N/A   Occupational History  . Not on file.   Social History Main Topics  . Smoking status: Former Smoker    Packs/day: 0.50    Years: 20.00    Types: Cigarettes    Start date: 07/21/1977    Quit date: 10/29/2014  . Smokeless tobacco: Never Used     Comment: cutting back to 1 or 2 cigarettes every 3 days  . Alcohol use 1.8 oz/week    3 Cans of beer per week     Comment: 16 ounce per day  . Drug use: No     Comment: previously incarcerated for drug related offense.  [name] Kitchen Sexual activity: Not Currently   Other Topics Concern  . Not on file   Social History Narrative  . No narrative on file      Family History  Problem Relation Age of Onset  . Stroke Mother   . Heart attack Mother   . Heart attack Father   . Stroke Sister   . Heart attack Sister   . Heart attack Brother   . Stroke Brother   . Liver disease Neg Hx   . Colon cancer Neg Hx     Vitals:   08/14/17 1403  BP: 102/80  Pulse: 88  SpO2: 100%  Weight: 171 lb 12.8 oz (77.9 kg)   Wt Readings from Last 3 Encounters:  08/14/17 171 lb 12.8 oz (77.9 kg)  07/15/17 168 lb 12  oz (76.5 kg)  06/11/17 155 lb 4.8 oz (70.4 kg)   PHYSICAL EXAM: General: Elderly appearing. NAD.  HEENT: Normal Neck: Supple. JVP 6-7 cm. Carotids 2+ bilat; no bruits. No thyromegaly or nodule noted.  Cor: PMI nondisplaced. RRR, No M/G/R noted Lungs: CTAB, normal effort. Abdomen: Soft, non-tender, non-distended, no HSM. No bruits or masses. +BS  Extremities: No cyanosis, clubbing, or rash. R and LLE no edema. + Varicose veins L>R.  Neuro: Alert & orientedx3, cranial nerves grossly intact. moves all 4 extremities w/o difficulty. Affect pleasant   ASSESSMENT & PLAN:  1. CAD: Recent anterior STEMI 4/18. Extensive prior history of CAD. He has known total occlusion LCx and PLV, DES to totally occluded LAD admission 01/2017.  - Cath 06/10/17 as above with no PCI targets with chronically occluded circumflex with collaterals, LAD stent patent. - Continues to have atypical chest pain. Sounds more like GERD at this time. Will recommend he try Zantac, and follow up with PCP for further/PPI if needed.  - No indication for additional ACS work up at this time.  - Continue coumadin with LV thrombus.  - Continue Plavix.  - He completed 1 month of ASA. No further.  - No statin given history of rhabdomyolysis. LDL 123 07/15/17.. - Continue Zetia - Continue Repatha.   2. Acute on chronic systolic VOJ:[id number] cardiomyoapthy. EF 25% on LV-gram, LVEDP 38 on cath 4/26. Pulmonary edema on admission CXR. Echo 8/18 with EF 20%, diffuse hypokinesis, LV thrombus noted, normal RV, moderate MR. 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 Volume status stable.  - NYHA II-III - Volume status stable on exam.  - Continue lasix 80 mg q am and 40 mg q pm. BMET today.  - Continue low dose coreg 3.125 mg BID. Will not increase with lightheadedness. - Continue losartan 12.5 mg daily.  - Continue spironolactone 25 mg qhs.  - Continue Digoxin 0.125mg . Level stable 07/15/17.  - Referral  sent to EP at last visit, but never heard word. Will re-send. (May be due to pt being in Assisted living.)  3. Diabetes:  - Per PCP  4. Smoking:  - Encouraged complete cessation.  5. Leg pain/cramps:  - Bilateral. Has history of rhabdomyolysis. CK normal.  - ABIs with R ABI of 0.84 suggestive of mild disease. And L ABI of 1.27 (Normal flow at rest) - Avoiding statins.  - No changes.  6. LV thrombus:  - On coumadin as above.  - Has INR appt tomorrow.   7. CVA - H/o multiple CVAs - on coumadin as above. Denies bleeding.   Overall doing well. Labs today. No room to up-titrate meds with soft pressures and occasional lightheadedness. Re-send referal to EP with correct phone number. RTC 2 months; sooner with symptoms.   Shirley Friar, PA-C 08/14/17   Greater than 50% of the 25 minute visit was spent in counseling/coordination of care regarding EP referral/ICD, salt/fluid restrictions, sliding scale diuretics, and disease state education.

## 2017-08-14 NOTE — Patient Instructions (Signed)
Routine lab work today. Will notify you of abnormal results, otherwise no news is good news!  Take Zantac over the counter for discomfort after eating. If symptoms do not subside, call PCP to follow up.  Follow up 2 months with Dr. Aundra Dubin.  _________________________________________________________ Daniel Li Code: 8002  Take all medication as prescribed the day of your appointment. Bring all medications with you to your appointment.  Do the following things EVERYDAY: 1) Weigh yourself in the morning before breakfast. Write it down and keep it in a log. 2) Take your medicines as prescribed 3) Eat low salt foods-Limit salt (sodium) to 2000 mg per day.  4) Stay as active as you can everyday 5) Limit all fluids for the day to less than 2 liters

## 2017-08-15 ENCOUNTER — Ambulatory Visit (INDEPENDENT_AMBULATORY_CARE_PROVIDER_SITE_OTHER): Payer: Medicaid Other

## 2017-08-15 ENCOUNTER — Ambulatory Visit (INDEPENDENT_AMBULATORY_CARE_PROVIDER_SITE_OTHER): Payer: Medicaid Other | Admitting: *Deleted

## 2017-08-15 DIAGNOSIS — I2129 ST elevation (STEMI) myocardial infarction involving other sites: Secondary | ICD-10-CM

## 2017-08-15 DIAGNOSIS — Z23 Encounter for immunization: Secondary | ICD-10-CM

## 2017-08-15 DIAGNOSIS — Z5181 Encounter for therapeutic drug level monitoring: Secondary | ICD-10-CM | POA: Diagnosis not present

## 2017-08-15 LAB — POCT INR: INR: 2.4

## 2017-09-11 ENCOUNTER — Ambulatory Visit (INDEPENDENT_AMBULATORY_CARE_PROVIDER_SITE_OTHER): Payer: Medicaid Other | Admitting: Internal Medicine

## 2017-09-11 ENCOUNTER — Encounter: Payer: Self-pay | Admitting: Internal Medicine

## 2017-09-11 VITALS — BP 102/70 | HR 92 | Ht 66.0 in | Wt 172.4 lb

## 2017-09-11 DIAGNOSIS — I5022 Chronic systolic (congestive) heart failure: Secondary | ICD-10-CM

## 2017-09-11 DIAGNOSIS — I255 Ischemic cardiomyopathy: Secondary | ICD-10-CM

## 2017-09-11 NOTE — Patient Instructions (Addendum)
Medication Instructions:  Your physician recommends that you continue on your current medications as directed. Please refer to the Current Medication list given to you today.  -- If you need a refill on your cardiac medications before your next appointment, please call your pharmacy. --  Labwork: Your physician recommends that have lab work today:  CBC/BMET   Testing/Procedures: Your physician has recommended that you have a defibrillator inserted on 11/20. An implantable cardioverter defibrillator (ICD) is a small device that is placed in your chest or, in rare cases, your abdomen. This device uses electrical pulses or shocks to help control life-threatening, irregular heartbeats that could lead the heart to suddenly stop beating (sudden cardiac arrest). Leads are attached to the ICD that goes into your heart. This is done in the hospital and usually requires an overnight stay. Please see the instruction sheet given to you today for more information.   Please arrive at [facility] at 1:30 pm   Do not eat or drink after midnight the night prior to the procedure HOLD COUMADIN 48 hours prior to procedure.  Last dose 11/17 Do not take any medications the morning of the test Plan for one night stay Will need someone to drive you home at discharge  Please follow cleansing instructions     Follow-Up: Your physician wants you to follow-up in 10-14 days from 11/20 with device clinic for wound check and in 3 months with Dr. Rayann Heman.    Thank you for choosing [facility]!!   Frederik Schmidt, RN [phone]  Any Other Special Instructions Will Be Listed Below (If Applicable).

## 2017-09-12 ENCOUNTER — Telehealth: Payer: Self-pay | Admitting: Internal Medicine

## 2017-09-12 ENCOUNTER — Ambulatory Visit (INDEPENDENT_AMBULATORY_CARE_PROVIDER_SITE_OTHER): Payer: Medicaid Other | Admitting: *Deleted

## 2017-09-12 DIAGNOSIS — Z5181 Encounter for therapeutic drug level monitoring: Secondary | ICD-10-CM | POA: Diagnosis not present

## 2017-09-12 DIAGNOSIS — I2129 ST elevation (STEMI) myocardial infarction involving other sites: Secondary | ICD-10-CM

## 2017-09-12 LAB — CBC WITH DIFFERENTIAL/PLATELET
BASOS ABS: 0 10*3/uL (ref 0.0–0.2)
BASOS: 0 %
EOS (ABSOLUTE): 0.1 10*3/uL (ref 0.0–0.4)
Eos: 1 %
HEMOGLOBIN: 13.5 g/dL (ref 13.0–17.7)
Hematocrit: 40.8 % (ref 37.5–51.0)
IMMATURE GRANS (ABS): 0 10*3/uL (ref 0.0–0.1)
IMMATURE GRANULOCYTES: 0 %
LYMPHS: 40 %
Lymphocytes Absolute: 3.1 10*3/uL (ref 0.7–3.1)
MCH: 26 pg — ABNORMAL LOW (ref 26.6–33.0)
MCHC: 33.1 g/dL (ref 31.5–35.7)
MCV: 79 fL (ref 79–97)
MONOCYTES: 7 %
Monocytes Absolute: 0.5 10*3/uL (ref 0.1–0.9)
NEUTROS ABS: 3.9 10*3/uL (ref 1.4–7.0)
NEUTROS PCT: 52 %
PLATELETS: 235 10*3/uL (ref 150–379)
RBC: 5.19 x10E6/uL (ref 4.14–5.80)
RDW: 16.1 % — ABNORMAL HIGH (ref 12.3–15.4)
WBC: 7.7 10*3/uL (ref 3.4–10.8)

## 2017-09-12 LAB — BASIC METABOLIC PANEL
BUN / CREAT RATIO: 13 (ref 10–24)
BUN: 16 mg/dL (ref 8–27)
CALCIUM: 10.3 mg/dL — AB (ref 8.6–10.2)
CHLORIDE: 100 mmol/L (ref 96–106)
CO2: 29 mmol/L (ref 20–29)
CREATININE: 1.24 mg/dL (ref 0.76–1.27)
GFR calc Af Amer: 72 mL/min/{1.73_m2} (ref >59)
GFR calc non Af Amer: 62 mL/min/{1.73_m2} (ref >59)
GLUCOSE: 90 mg/dL (ref 65–99)
Potassium: 4.5 mmol/L (ref 3.5–5.2)
Sodium: 142 mmol/L (ref 134–144)

## 2017-09-12 LAB — POCT INR: INR: 2.8

## 2017-09-12 NOTE — Telephone Encounter (Signed)
Sherri and I spoke with [facility] regarding the patient's transportation to the hospital on 11/20 for ICD implant.  They informed us that it was not enough notice to facilitate his transportation (need 5 business day notice).  After reviewing this with Dr. Rayann Heman, it was decided to move the procedure to 11/23.  [facility] was notified with the changes.

## 2017-09-12 NOTE — Telephone Encounter (Signed)
Attempted to reach assisted living facility where pt resides to discuss.  No answer

## 2017-09-12 NOTE — Telephone Encounter (Signed)
Follow Up   Caregiver calling back regarding patient's upcoming procedure. Requesting call back

## 2017-09-12 NOTE — Telephone Encounter (Signed)
New Message     Fax letter to [facility] [phone]    Needs a letter of medical necessity for his appt for on  09/17/17 for ICD implant surgery , before they will provide his transportation

## 2017-09-12 NOTE — Telephone Encounter (Signed)
Spoke with [facility] regarding patient's procedure on 11/20.  The facility will transport him to the hospital.  I reminded them to follow the pre procedure instructions.  The facility verbalized understanding

## 2017-09-13 ENCOUNTER — Encounter: Payer: Self-pay | Admitting: Internal Medicine

## 2017-09-13 ENCOUNTER — Telehealth: Payer: Self-pay | Admitting: Cardiology

## 2017-09-13 NOTE — Telephone Encounter (Signed)
Telephoned Judy back at [facility] and read instructions from CVRR visit.
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
## 2017-09-13 NOTE — Telephone Encounter (Signed)
Bethena Roys from [employer]  [phone]) called in regards to the instructions of his coumdin that was given to him On 09-12-17. Patient is scheduled for surgery in the upcoming week.

## 2017-09-17 ENCOUNTER — Encounter (HOSPITAL_COMMUNITY)
Admission: RE | Disposition: A | Discharge status: Home / Self Care | Payer: Self-pay | Source: Ambulatory Visit | Attending: Internal Medicine

## 2017-09-17 ENCOUNTER — Encounter (HOSPITAL_COMMUNITY): Payer: Self-pay | Admitting: Internal Medicine

## 2017-09-17 ENCOUNTER — Ambulatory Visit (HOSPITAL_COMMUNITY)
Admission: RE | Admit: 2017-09-17 | Discharge: 2017-09-18 | Disposition: A | Discharge status: Home / Self Care | Payer: Medicaid Other | Source: Ambulatory Visit | Attending: Internal Medicine | Admitting: Internal Medicine

## 2017-09-17 ENCOUNTER — Other Ambulatory Visit: Payer: Self-pay | Admitting: Cardiology

## 2017-09-17 ENCOUNTER — Other Ambulatory Visit: Payer: Self-pay

## 2017-09-17 DIAGNOSIS — Z7902 Long term (current) use of antithrombotics/antiplatelets: Secondary | ICD-10-CM | POA: Insufficient documentation

## 2017-09-17 DIAGNOSIS — Z79899 Other long term (current) drug therapy: Secondary | ICD-10-CM | POA: Insufficient documentation

## 2017-09-17 DIAGNOSIS — Z959 Presence of cardiac and vascular implant and graft, unspecified: Secondary | ICD-10-CM

## 2017-09-17 DIAGNOSIS — I110 Hypertensive heart disease with heart failure: Secondary | ICD-10-CM | POA: Diagnosis not present

## 2017-09-17 DIAGNOSIS — I2510 Atherosclerotic heart disease of native coronary artery without angina pectoris: Secondary | ICD-10-CM | POA: Diagnosis not present

## 2017-09-17 DIAGNOSIS — M1990 Unspecified osteoarthritis, unspecified site: Secondary | ICD-10-CM | POA: Diagnosis not present

## 2017-09-17 DIAGNOSIS — I252 Old myocardial infarction: Secondary | ICD-10-CM | POA: Diagnosis not present

## 2017-09-17 DIAGNOSIS — I5042 Chronic combined systolic (congestive) and diastolic (congestive) heart failure: Secondary | ICD-10-CM | POA: Diagnosis not present

## 2017-09-17 DIAGNOSIS — E119 Type 2 diabetes mellitus without complications: Secondary | ICD-10-CM | POA: Diagnosis not present

## 2017-09-17 DIAGNOSIS — E7800 Pure hypercholesterolemia, unspecified: Secondary | ICD-10-CM | POA: Insufficient documentation

## 2017-09-17 DIAGNOSIS — I255 Ischemic cardiomyopathy: Secondary | ICD-10-CM | POA: Diagnosis present

## 2017-09-17 DIAGNOSIS — Z87891 Personal history of nicotine dependence: Secondary | ICD-10-CM | POA: Insufficient documentation

## 2017-09-17 DIAGNOSIS — Z006 Encounter for examination for normal comparison and control in clinical research program: Secondary | ICD-10-CM | POA: Insufficient documentation

## 2017-09-17 DIAGNOSIS — Z7901 Long term (current) use of anticoagulants: Secondary | ICD-10-CM | POA: Insufficient documentation

## 2017-09-17 DIAGNOSIS — Z23 Encounter for immunization: Secondary | ICD-10-CM | POA: Insufficient documentation

## 2017-09-17 DIAGNOSIS — Z955 Presence of coronary angioplasty implant and graft: Secondary | ICD-10-CM | POA: Insufficient documentation

## 2017-09-17 DIAGNOSIS — Z8673 Personal history of transient ischemic attack (TIA), and cerebral infarction without residual deficits: Secondary | ICD-10-CM | POA: Insufficient documentation

## 2017-09-17 DIAGNOSIS — G4733 Obstructive sleep apnea (adult) (pediatric): Secondary | ICD-10-CM | POA: Insufficient documentation

## 2017-09-17 HISTORY — DX: Sleep apnea, unspecified: G47.30

## 2017-09-17 HISTORY — PX: ICD IMPLANT: EP1208

## 2017-09-17 HISTORY — DX: Headache: R51

## 2017-09-17 HISTORY — DX: Presence of automatic (implantable) cardiac defibrillator: Z95.810

## 2017-09-17 HISTORY — DX: Headache, unspecified: R51.9

## 2017-09-17 LAB — GLUCOSE, CAPILLARY
Glucose-Capillary: 120 mg/dL — ABNORMAL HIGH (ref 65–99)
Glucose-Capillary: 79 mg/dL (ref 65–99)
Glucose-Capillary: 221 mg/dL — ABNORMAL HIGH (ref 65–99)
Glucose-Capillary: 95 mg/dL (ref 65–99)

## 2017-09-17 LAB — PROTIME-INR
INR: 1.86
PROTHROMBIN TIME: 21.2 s — AB (ref 11.4–15.2)

## 2017-09-17 LAB — SURGICAL PCR SCREEN
MRSA, PCR: POSITIVE — AB
Staphylococcus aureus: POSITIVE — AB

## 2017-09-17 SURGERY — ICD IMPLANT

## 2017-09-17 MED ORDER — MIDAZOLAM HCL 5 MG/5ML IJ SOLN
INTRAMUSCULAR | Status: AC
  Filled 2017-09-17: qty 5

## 2017-09-17 MED ORDER — MIDAZOLAM HCL 5 MG/5ML IJ SOLN
INTRAMUSCULAR | Status: DC | PRN
Start: 2017-09-17 — End: 2017-09-17
  Administered 2017-09-17 (×2): 1 mg via INTRAVENOUS

## 2017-09-17 MED ORDER — MUPIROCIN 2 % EX OINT
TOPICAL_OINTMENT | CUTANEOUS | Status: AC
  Administered 2017-09-17: 11:00:00
  Filled 2017-09-17: qty 22

## 2017-09-17 MED ORDER — TRAMADOL HCL 50 MG PO TABS
50.0000 mg | ORAL_TABLET | Freq: Two times a day (BID) | ORAL | Status: DC | PRN
  Administered 2017-09-18: 50 mg via ORAL
  Filled 2017-09-17: qty 1

## 2017-09-17 MED ORDER — CARVEDILOL 3.125 MG PO TABS
3.1250 mg | ORAL_TABLET | Freq: Two times a day (BID) | ORAL | Status: DC
  Administered 2017-09-18: 3.125 mg via ORAL
  Filled 2017-09-17: qty 1

## 2017-09-17 MED ORDER — WARFARIN - PHYSICIAN DOSING INPATIENT: Freq: Every day | Status: DC

## 2017-09-17 MED ORDER — SODIUM CHLORIDE 0.9 % IV SOLN
INTRAVENOUS | Status: DC
  Administered 2017-09-17: 11:00:00 via INTRAVENOUS

## 2017-09-17 MED ORDER — SPIRONOLACTONE 25 MG PO TABS: 25.0000 mg | ORAL_TABLET | Freq: Every evening | ORAL | Status: DC

## 2017-09-17 MED ORDER — SODIUM CHLORIDE 0.9% FLUSH: 3.0000 mL | INTRAVENOUS | Status: DC | PRN

## 2017-09-17 MED ORDER — PNEUMOCOCCAL VAC POLYVALENT 25 MCG/0.5ML IJ INJ
0.5000 mL | INJECTION | INTRAMUSCULAR | Status: AC
  Administered 2017-09-18: 0.5 mL via INTRAMUSCULAR
  Filled 2017-09-17: qty 0.5

## 2017-09-17 MED ORDER — ACETAMINOPHEN 325 MG PO TABS: 325.0000 mg | ORAL_TABLET | ORAL | Status: DC | PRN

## 2017-09-17 MED ORDER — SODIUM CHLORIDE 0.9% FLUSH
3.0000 mL | Freq: Two times a day (BID) | INTRAVENOUS | Status: DC
  Administered 2017-09-17: 3 mL via INTRAVENOUS

## 2017-09-17 MED ORDER — ONDANSETRON HCL 4 MG/2ML IJ SOLN: 4.0000 mg | Freq: Four times a day (QID) | INTRAMUSCULAR | Status: DC | PRN

## 2017-09-17 MED ORDER — LIDOCAINE HCL (PF) 1 % IJ SOLN
INTRAMUSCULAR | Status: AC
  Filled 2017-09-17: qty 30

## 2017-09-17 MED ORDER — HEPARIN (PORCINE) IN NACL 2-0.9 UNIT/ML-% IJ SOLN
INTRAMUSCULAR | Status: AC
  Filled 2017-09-17: qty 500

## 2017-09-17 MED ORDER — GENTAMICIN SULFATE 40 MG/ML IJ SOLN
INTRAMUSCULAR | Status: AC
  Filled 2017-09-17: qty 2

## 2017-09-17 MED ORDER — FENTANYL CITRATE (PF) 100 MCG/2ML IJ SOLN
INTRAMUSCULAR | Status: AC
  Filled 2017-09-17: qty 2

## 2017-09-17 MED ORDER — GLIPIZIDE ER 10 MG PO TB24
10.0000 mg | ORAL_TABLET | Freq: Every day | ORAL | Status: DC
  Administered 2017-09-18: 10 mg via ORAL
  Filled 2017-09-17: qty 1

## 2017-09-17 MED ORDER — SODIUM CHLORIDE 0.9 % IR SOLN
80.0000 mg | Status: AC
  Administered 2017-09-17: 80 mg

## 2017-09-17 MED ORDER — LOSARTAN POTASSIUM 25 MG PO TABS
12.5000 mg | ORAL_TABLET | Freq: Every day | ORAL | Status: DC
Start: 2017-09-18 — End: 2017-09-18
  Administered 2017-09-18: 12.5 mg via ORAL
  Filled 2017-09-17: qty 1

## 2017-09-17 MED ORDER — CEFAZOLIN SODIUM-DEXTROSE 2-4 GM/100ML-% IV SOLN
INTRAVENOUS | Status: AC
  Filled 2017-09-17: qty 100

## 2017-09-17 MED ORDER — CEFAZOLIN SODIUM-DEXTROSE 2-4 GM/100ML-% IV SOLN
2.0000 g | INTRAVENOUS | Status: AC
Start: 2017-09-17 — End: 2017-09-17
  Administered 2017-09-17: 2 g via INTRAVENOUS

## 2017-09-17 MED ORDER — POTASSIUM CHLORIDE CRYS ER 20 MEQ PO TBCR
20.0000 meq | EXTENDED_RELEASE_TABLET | Freq: Every day | ORAL | Status: DC
  Administered 2017-09-18: 20 meq via ORAL
  Filled 2017-09-17: qty 1

## 2017-09-17 MED ORDER — FUROSEMIDE 80 MG PO TABS
80.0000 mg | ORAL_TABLET | Freq: Every day | ORAL | Status: DC
Start: 2017-09-18 — End: 2017-09-18
  Administered 2017-09-18: 80 mg via ORAL
  Filled 2017-09-17: qty 1

## 2017-09-17 MED ORDER — SODIUM CHLORIDE 0.9 % IV SOLN: 250.0000 mL | INTRAVENOUS | Status: DC | PRN

## 2017-09-17 MED ORDER — IOPAMIDOL (ISOVUE-370) INJECTION 76%
INTRAVENOUS | Status: DC | PRN
  Administered 2017-09-17: 15 mL via INTRAVENOUS

## 2017-09-17 MED ORDER — FENTANYL CITRATE (PF) 100 MCG/2ML IJ SOLN
INTRAMUSCULAR | Status: DC | PRN
  Administered 2017-09-17: 25 ug via INTRAVENOUS

## 2017-09-17 MED ORDER — IOPAMIDOL (ISOVUE-370) INJECTION 76%
INTRAVENOUS | Status: AC
  Filled 2017-09-17: qty 50

## 2017-09-17 MED ORDER — CHLORHEXIDINE GLUCONATE 4 % EX LIQD: 60.0000 mL | Freq: Once | CUTANEOUS | Status: DC

## 2017-09-17 MED ORDER — HYDROCODONE-ACETAMINOPHEN 5-325 MG PO TABS
1.0000 | ORAL_TABLET | ORAL | Status: DC | PRN
  Administered 2017-09-17 (×2): 1 via ORAL
  Administered 2017-09-18: 2 via ORAL
  Filled 2017-09-17: qty 2
  Filled 2017-09-17 (×2): qty 1

## 2017-09-17 MED ORDER — CLOPIDOGREL BISULFATE 75 MG PO TABS
75.0000 mg | ORAL_TABLET | Freq: Every day | ORAL | Status: DC
  Administered 2017-09-18: 75 mg via ORAL
  Filled 2017-09-17: qty 1

## 2017-09-17 MED ORDER — CEFAZOLIN SODIUM-DEXTROSE 1-4 GM/50ML-% IV SOLN
1.0000 g | Freq: Four times a day (QID) | INTRAVENOUS | Status: AC
  Administered 2017-09-17 – 2017-09-18 (×3): 1 g via INTRAVENOUS
  Filled 2017-09-17 (×3): qty 50

## 2017-09-17 MED ORDER — GABAPENTIN 100 MG PO CAPS
100.0000 mg | ORAL_CAPSULE | Freq: Two times a day (BID) | ORAL | Status: DC
  Administered 2017-09-17 – 2017-09-18 (×2): 100 mg via ORAL
  Filled 2017-09-17 (×2): qty 1

## 2017-09-17 MED ORDER — WARFARIN SODIUM 2.5 MG PO TABS
2.5000 mg | ORAL_TABLET | Freq: Once | ORAL | Status: AC
Start: 2017-09-17 — End: 2017-09-17
  Administered 2017-09-17: 2.5 mg via ORAL
  Filled 2017-09-17: qty 1

## 2017-09-17 MED ORDER — LIDOCAINE HCL (PF) 1 % IJ SOLN
INTRAMUSCULAR | Status: DC | PRN
Start: 2017-09-17 — End: 2017-09-17
  Administered 2017-09-17: 60 mL

## 2017-09-17 MED ORDER — DIGOXIN 125 MCG PO TABS
0.1250 mg | ORAL_TABLET | Freq: Every day | ORAL | Status: DC
  Administered 2017-09-18: 0.125 mg via ORAL
  Filled 2017-09-17: qty 1

## 2017-09-17 MED ORDER — HEPARIN (PORCINE) IN NACL 2-0.9 UNIT/ML-% IJ SOLN
INTRAMUSCULAR | Status: AC | PRN
  Administered 2017-09-17: 500 mL

## 2017-09-17 SURGICAL SUPPLY — 6 items
CABLE SURGICAL S-101-97-12 (CABLE) ×3 IMPLANT
ICD FORTIFY ASSU VR CD1357-40Q (ICD Generator) ×3 IMPLANT
LEAD DURATA 7122Q-65CM (Lead) ×3 IMPLANT
PAD DEFIB LIFELINK (PAD) ×3 IMPLANT
SHEATH CLASSIC 7F (SHEATH) ×3 IMPLANT
TRAY PACEMAKER INSERTION (PACKS) ×3 IMPLANT

## 2017-09-17 NOTE — Discharge Summary (Signed)
ELECTROPHYSIOLOGY PROCEDURE DISCHARGE SUMMARY    Patient ID: Daniel Li,  MRN: [id number], DOB/AGE: May 28, 1956 61 y.o.  Admit date: 09/17/2017 Discharge date: 09/18/2017  Primary Care Physician: Mabeline Caras, NP  Primary Cardiologist: Dr. Harl Bowie AHF: Dr. Aundra Dubin Electrophysiologist: Dr. Rayann Heman  Primary Discharge Diagnosis:  1. ICM  Secondary Discharge Diagnosis:  1. Chronic CHF (mixedsystolic/diastolic) 2. Advanced CAD, last intervention April 2018     On Plavix w/warfarin, BB (no stain 2/2 hx of rhabodmyolysis with statin) 3. CVA (old) 4. DM 5. OSA 6. HTN 7. Hx of LV thrombus     On warfarin, managed with coumadin clinic  Allergies  Allergen Reactions  . Pork-Derived Products Other (See Comments)    Does not eat for religious reasons - okay with using IV heparin     Procedures This Admission:  1.  Implantation of a SJM single chamber ICD on 09/17/17 by Dr Rayann Heman.  The patient received a [place], model 7122Q-65 (serial number [id number]) right ventricular defibrillator lead,  [place] VR model CD1357-40Q (serial  Number [id number]) ICD DFT's were deferred at time of implant.   There were no immediate post procedure complications. 2.  CXR on 09/18/17 demonstrated no pneumothorax status post device implantation.   Brief HPI: Daniel Li is a 61 y.o. male was referred to electrophysiology in the outpatient setting for consideration of ICD implantation.  Past medical history notable for above.  The patient has persistent LV dysfunction despite guideline directed therapy.  Risks, benefits, and alternatives to ICD implantation were reviewed with the patient who wished to proceed.   Hospital Course:  The patient was admitted and underwent implantation of an ICD with details as outlined above. He was monitored on telemetry overnight which demonstrated SR.  Left chest was without hematoma or ecchymosis.  The device was  interrogated and found to be functioning normally.  CXR was obtained and demonstrated no pneumothorax status post device implantation.  Wound care, arm mobility, and restrictions were reviewed with the patient.  The patient was examined by Dr. Rayann Heman and considered stable for discharge to home.   The patient's discharge medications include an ARB (Losartan) and beta blocker (carvedilol).   Physical Exam: Vitals:   09/17/17 2124 09/18/17 0440 09/18/17 0500 09/18/17 0854  BP: 97/75 99/72  109/77  Pulse: 74 67  77  Resp: 18 16    Temp: 98.6 F (37 C) 97.9 F (36.6 C)    TempSrc: Oral Oral    SpO2: 98% 98%    Weight:   172 lb 4.8 oz (78.2 kg)   Height:        GEN- The patient is well appearing, alert and oriented x 3 today.   HEENT: normocephalic, atraumatic; sclera clear, conjunctiva pink; hearing intact; oropharynx clear Lungs- CTA b/l, normal work of breathing.  No wheezes, rales, rhonchi Heart- RRR, soft SM, np rubs or gallops, PMI not laterally displaced GI- soft, non-tender, non-distended Extremities- no clubbing, cyanosis, or edema MS- no significant deformity or atrophy Skin- warm and dry, no rash or lesion, left  chest without hematoma/ecchymosis Psych- euthymic mood, full affect Neuro- no gross deficits  Supplemental Discharge Instructions for  Pacemaker/Defibrillator Patients  Activity No heavy lifting or vigorous activity with your left/right arm for 6 to 8 weeks.  Do not raise your left/right arm above your head for one week.  Gradually raise your affected arm as drawn below.  09/21/17                   09/22/17                  09/23/17                 09/24/17 __  NO DRIVING (the patient does not drive) WOUND CARE - Keep the wound area clean and dry.  Do not get this area wet, no showers until cleared to at your wound check visit. - The tape/steri-strips on your wound will fall off; do not pull them off.  No bandage is needed on the site.  DO  NOT  apply any creams, oils, or ointments to the wound area. - If you notice any drainage or discharge from the wound, any swelling or bruising at the site, or you develop a fever > 101? F after you are discharged home, call the office at once.  Special Instructions - You are still able to use cellular telephones; use the ear opposite the side where you have your pacemaker/defibrillator.  Avoid carrying your cellular phone near your device. - When traveling through airports, show security personnel your identification card to avoid being screened in the metal detectors.  Ask the security personnel to use the hand wand. - Avoid arc welding equipment, MRI testing (magnetic resonance imaging), TENS units (transcutaneous nerve stimulators).  Call the office for questions about other devices. - Avoid electrical appliances that are in poor condition or are not properly grounded. - Microwave ovens are safe to be near or to operate.  Additional information for defibrillator patients should your device go off: - If your device goes off ONCE and you feel fine afterward, notify the device clinic nurses. - If your device goes off ONCE and you do not feel well afterward, call 911. - If your device goes off TWICE, call 911. - If your device goes off THREE times in one day, call 911.  DO NOT DRIVE YOURSELF OR A FAMILY MEMBER WITH A DEFIBRILLATOR TO THE HOSPITAL-CALL 911.    Labs:   Lab Results  Component Value Date   WBC 7.7 09/11/2017   HGB 13.5 09/11/2017   HCT 40.8 09/11/2017   MCV 79 09/11/2017   PLT 235 09/11/2017    Recent Labs  Lab 09/18/17 0409  NA 138  K 3.8  CL 103  CO2 29  BUN 10  CREATININE 1.03  CALCIUM 9.1  GLUCOSE 106*    Discharge Medications:  Allergies as of 09/18/2017      Reactions   Pork-derived Products Other (See Comments)   Does not eat for religious reasons - okay with using IV heparin      Medication List    TAKE these medications   acetaminophen 325 MG  tablet Commonly known as:  TYLENOL Take 2 tablets (650 mg total) by mouth every 4 (four) hours as needed for headache or mild pain.   carvedilol 3.125 MG tablet Commonly known as:  COREG Take 1 tablet (3.125 mg total) by mouth 2 (two) times daily with a meal.   clopidogrel 75 MG tablet Commonly known as:  PLAVIX Take 1 tablet (75 mg total) by mouth daily.   digoxin 0.125 MG tablet Commonly known as:  LANOXIN Take 1 tablet (0.125 mg total) by mouth daily.   Evolocumab 140 MG/ML Soaj Commonly known as:  REPATHA SURECLICK Inject 1 pen into the skin every 14 (fourteen) days.   ezetimibe 10 MG  tablet Commonly known as:  ZETIA Take 1 tablet (10 mg total) by mouth daily.   furosemide 40 MG tablet Commonly known as:  LASIX Take 80 mg (2 tabs) in am and 40 mg (1 tab) in pm   gabapentin 100 MG capsule Commonly known as:  NEURONTIN Take 1 capsule (100 mg total) by mouth 2 (two) times daily.   glipiZIDE 10 MG 24 hr tablet Commonly known as:  GLUCOTROL XL Take 1 tablet (10 mg total) by mouth daily.   guaifenesin 100 MG/5ML syrup Commonly known as:  ROBITUSSIN Take 200 mg as needed by mouth for cough.   losartan 25 MG tablet Commonly known as:  COZAAR Take 0.5 tablets (12.5 mg total) by mouth daily.   metolazone 2.5 MG tablet Commonly known as:  ZAROXOLYN Take 1 tablet (2.5 mg total) by mouth daily as needed. For weight and fluid gain. Only if weight dose not come down with an extra Lasix. See discharge orders.   mupirocin ointment 2 % Commonly known as:  BACTROBAN Place into the nose 2 (two) times daily for 4 days.   nitroGLYCERIN 0.4 MG SL tablet Commonly known as:  NITROSTAT Place 1 tablet (0.4 mg total) under the tongue every 5 (five) minutes as needed for chest pain.   potassium chloride SA 20 MEQ tablet Commonly known as:  K-DUR,KLOR-CON Take 1 tablet (20 mEq total) by mouth daily.   spironolactone 25 MG tablet Commonly known as:  ALDACTONE Take 1 tablet (25 mg  total) by mouth every evening.   traMADol 50 MG tablet Commonly known as:  ULTRAM Take 50 mg 2 (two) times daily as needed by mouth for moderate pain.   warfarin 5 MG tablet Commonly known as:  COUMADIN Take as directed. If you are unsure how to take this medication, talk to your nurse or doctor. Original instructions:  TAKE 1&1/2 TABLETS (7.5MG ) BY MOUTH DAILY.TAKE AS DIRECTED BY COUMADIN CLINIC. What changed:  See the new instructions. Notes to patient:  Continue current regime as directed by coumadin clinic       Disposition:  Home  Discharge Instructions    Diet - low sodium heart healthy   Complete by:  As directed    Increase activity slowly   Complete by:  As directed      Follow-up Information    [place] Office Follow up on 09/30/2017.   Specialty:  Cardiology Why:  3:30PM, wound check visit Contact information: [address], [address] [place]       Larey Dresser, MD Follow up on 10/15/2017.   Specialty:  Cardiology Why:  2:40PM Contact information: [place] [address] [place] Follow up on 10/01/2017.   Specialty:  Cardiology Why:  11:15AM, coumadin clinic Contact information: [address] [place]       Thompson Grayer, MD Follow up on 12/27/2017.   Specialty:  Cardiology Why:  9:45AM Contact information: [place] [address] [phone]           Duration of Discharge Encounter: Greater than 30 minutes including physician time.  Venetia Night, PA-C 09/18/2017 9:30 AM

## 2017-09-17 NOTE — Interval H&P Note (Signed)
History and Physical Interval Note:  09/17/2017 10:29 AM  Daniel Li  has presented today for surgery, with the diagnosis of ischemic cardiomyopathy  The various methods of treatment have been discussed with the patient and family. After consideration of risks, benefits and other options for treatment, the patient has consented to  Procedure(s): ICD IMPLANT (N/A) as a surgical intervention .  The patient's history has been reviewed, patient examined, no change in status, stable for surgery.  I have reviewed the patient's chart and labs.  Questions were answered to the patient's satisfaction.   I spoke at length with director from his assisted care facility.  We discussed discharge instructions including wound care, arm movement, and anticoagulation resumption post procedure.  She confirms that the patient makes his own medical decisions and provides his own written consent.  Risks and benefits discussed again today at length with the patient.  ICD Criteria  Current LVEF:20%. Within 12 months prior to implant: Yes   Heart failure history: Yes, Class III  Cardiomyopathy history: Yes, Ischemic Cardiomyopathy - Prior MI.  Atrial Fibrillation/Atrial Flutter: No.  Ventricular tachycardia history: No.  Cardiac arrest history: No.  History of syndromes with risk of sudden death: No.  Previous ICD: No.  Current ICD indication: Primary  PPM indication: No.   Class I or II Bradycardia indication present: No  Beta Blocker therapy for 3 or more months: Yes, prescribed.   Ace Inhibitor/ARB therapy for 3 or more months: Yes, prescribed.      Thompson Grayer

## 2017-09-17 NOTE — Discharge Instructions (Signed)
° ° °  Supplemental Discharge Instructions for  Pacemaker/Defibrillator Patients  Activity No heavy lifting or vigorous activity with your left/right arm for 6 to 8 weeks.  Do not raise your left/right arm above your head for one week.  Gradually raise your affected arm as drawn below.             09/21/17                   09/22/17                  09/23/17                 09/24/17 __  NO DRIVING for  1 week   ; you may begin driving on  08/24/24.  WOUND CARE - Keep the wound area clean and dry.  Do not get this area wet, no showers until cleared to at your wound check visit. - The tape/steri-strips on your wound will fall off; do not pull them off.  No bandage is needed on the site.  DO  NOT apply any creams, oils, or ointments to the wound area. - If you notice any drainage or discharge from the wound, any swelling or bruising at the site, or you develop a fever > 101? F after you are discharged home, call the office at once.  Special Instructions - You are still able to use cellular telephones; use the ear opposite the side where you have your pacemaker/defibrillator.  Avoid carrying your cellular phone near your device. - When traveling through airports, show security personnel your identification card to avoid being screened in the metal detectors.  Ask the security personnel to use the hand wand. - Avoid arc welding equipment, MRI testing (magnetic resonance imaging), TENS units (transcutaneous nerve stimulators).  Call the office for questions about other devices. - Avoid electrical appliances that are in poor condition or are not properly grounded. - Microwave ovens are safe to be near or to operate.  Additional information for defibrillator patients should your device go off: - If your device goes off ONCE and you feel fine afterward, notify the device clinic nurses. - If your device goes off ONCE and you do not feel well afterward, call 911. - If your device goes off TWICE, call  911. - If your device goes off THREE times in one day, call 911.  DO NOT DRIVE YOURSELF OR A FAMILY MEMBER WITH A DEFIBRILLATOR TO THE HOSPITAL--CALL 911.

## 2017-09-18 ENCOUNTER — Ambulatory Visit (HOSPITAL_COMMUNITY): Payer: Medicaid Other

## 2017-09-18 DIAGNOSIS — I2510 Atherosclerotic heart disease of native coronary artery without angina pectoris: Secondary | ICD-10-CM | POA: Diagnosis not present

## 2017-09-18 DIAGNOSIS — Z006 Encounter for examination for normal comparison and control in clinical research program: Secondary | ICD-10-CM | POA: Diagnosis not present

## 2017-09-18 DIAGNOSIS — I255 Ischemic cardiomyopathy: Secondary | ICD-10-CM | POA: Diagnosis not present

## 2017-09-18 DIAGNOSIS — I252 Old myocardial infarction: Secondary | ICD-10-CM | POA: Diagnosis not present

## 2017-09-18 DIAGNOSIS — I5042 Chronic combined systolic (congestive) and diastolic (congestive) heart failure: Secondary | ICD-10-CM

## 2017-09-18 LAB — GLUCOSE, CAPILLARY: GLUCOSE-CAPILLARY: 113 mg/dL — AB (ref 65–99)

## 2017-09-18 LAB — PROTIME-INR
INR: 1.57
Prothrombin Time: 18.6 seconds — ABNORMAL HIGH (ref 11.4–15.2)

## 2017-09-18 LAB — BASIC METABOLIC PANEL
Anion gap: 6 (ref 5–15)
BUN: 10 mg/dL (ref 6–20)
CHLORIDE: 103 mmol/L (ref 101–111)
CO2: 29 mmol/L (ref 22–32)
CREATININE: 1.03 mg/dL (ref 0.61–1.24)
Calcium: 9.1 mg/dL (ref 8.9–10.3)
GFR calc Af Amer: >60 mL/min (ref >60)
GFR calc non Af Amer: >60 mL/min (ref >60)
Glucose, Bld: 106 mg/dL — ABNORMAL HIGH (ref 65–99)
POTASSIUM: 3.8 mmol/L (ref 3.5–5.1)
Sodium: 138 mmol/L (ref 135–145)

## 2017-09-18 MED ORDER — MUPIROCIN 2 % EX OINT
TOPICAL_OINTMENT | Freq: Two times a day (BID) | CUTANEOUS | Status: DC
  Administered 2017-09-18: 10:00:00 via NASAL

## 2017-09-18 MED ORDER — MUPIROCIN 2 % EX OINT: TOPICAL_OINTMENT | Freq: Two times a day (BID) | CUTANEOUS | 0 refills | Status: AC

## 2017-09-18 NOTE — Clinical Social Work Note (Signed)
Clinical Social Work Assessment  Patient Details  Name: Daniel Li MRN: [id number] Date of Birth: 04-05-1956  Date of referral:  09/18/17               Reason for consult:  Facility Placement(from [facility])                Permission sought to share information with:  Chartered certified accountant granted to share information::  Yes, Verbal Permission Granted  Name::        Agency::  [facility]  Relationship::     Contact Information:     Housing/Transportation Living arrangements for the past 2 months:  [place] of Information:  Patient, Facility Patient Interpreter Needed:  None Criminal Activity/Legal Involvement Pertinent to Current Situation/Hospitalization:  No - Comment as needed Significant Relationships:  Siblings Lives with:  Facility Resident Do you feel safe going back to the place where you live?  Yes Need for family participation in patient care:  No (Coment)  Care giving concerns:  Patient from [facility] in [place]. Patient had ICD placed. Discharge back to facility today.   Social Worker assessment / plan: CSW met with patient at bedside. Patient oriented and stated he is feeling well. Agreeable to go back to ALF. CSW offered to call family member for patient and patient declined. CSW called ALF and they indicated a staff member is on the way to pick up patient. CSW to fax discharge summary to facility. CSW signing off.  Employment status:  Retired Forensic scientist:  Medicaid In [place] PT Recommendations:  Not assessed at this time Information / Referral to community resources:  Other (Comment Required)(ALF)  Patient/Family's Response to care:  Patient appreciative of care.  Patient/Family's Understanding of and Emotional Response to Diagnosis, Current Treatment, and Prognosis: Patient with understanding of procedure he had and ready to go back to ALF.  Emotional Assessment Appearance:   Appears stated age Attitude/Demeanor/Rapport:  Other(appropriate) Affect (typically observed):  Pleasant, Calm Orientation:  Oriented to Self, Oriented to Place, Oriented to  Time, Oriented to Situation Alcohol / Substance use:  Not Applicable Psych involvement (Current and /or in the community):  No (Comment)  Discharge Needs  Concerns to be addressed:  Discharge Planning Concerns Readmission within the last 30 days:  No Current discharge risk:  None Barriers to Discharge:  No Barriers Identified   Estanislado Emms, LCSW 09/18/2017, 9:53 AM

## 2017-09-18 NOTE — Progress Notes (Signed)
Discharge instructions reviewed with pt. Pt has no questions at this time. IV removed. Pt's pacemaker dressing is clean dry and intact.

## 2017-09-18 NOTE — Progress Notes (Signed)
Device interrogation is reviewed and normal CXR reveals stable lead, no ptx  Thompson Grayer MD, [facility] 09/18/2017 9:02 AM

## 2017-09-30 ENCOUNTER — Ambulatory Visit (INDEPENDENT_AMBULATORY_CARE_PROVIDER_SITE_OTHER): Payer: Medicaid Other | Admitting: *Deleted

## 2017-09-30 DIAGNOSIS — I5022 Chronic systolic (congestive) heart failure: Secondary | ICD-10-CM | POA: Diagnosis not present

## 2017-09-30 DIAGNOSIS — Z95810 Presence of automatic (implantable) cardiac defibrillator: Secondary | ICD-10-CM

## 2017-09-30 DIAGNOSIS — I255 Ischemic cardiomyopathy: Secondary | ICD-10-CM

## 2017-09-30 LAB — CUP PACEART INCLINIC DEVICE CHECK
Battery Remaining Longevity: 105 mo
Brady Statistic RV Percent Paced: 0.05 %
Date Time Interrogation Session: 20181203161234
HIGH POWER IMPEDANCE MEASURED VALUE: 60 Ohm
Implantable Lead Implant Date: 20181120
Implantable Lead Location: 753860
Implantable Pulse Generator Implant Date: 20181120
Lead Channel Impedance Value: 450 Ohm
Lead Channel Pacing Threshold Amplitude: 0.75 V
Lead Channel Sensing Intrinsic Amplitude: 12 mV
Lead Channel Setting Pacing Amplitude: 3.5 V
Lead Channel Setting Pacing Pulse Width: 0.5 ms
MDC IDC MSMT LEADCHNL RV PACING THRESHOLD PULSEWIDTH: 0.5 ms
MDC IDC SET LEADCHNL RV SENSING SENSITIVITY: 0.5 mV
Pulse Gen Serial Number: 9786940

## 2017-09-30 NOTE — Progress Notes (Signed)
Wound check appointment. Steri-strips removed. Wound without redness or edema. Incision edges approximated, wound well healed. Normal device function. Threshold, sensing, and impedances consistent with implant measurements. Device programmed at 3.5V for extra safety margin until 3 month visit. Histogram distribution appropriate for patient and level of activity. No ventricular arrhythmias noted. Patient educated about wound care, arm mobility, lifting restrictions, shock plan, and Merlin monitor. ROV with JA/E on 12/27/17.

## 2017-10-01 ENCOUNTER — Ambulatory Visit (INDEPENDENT_AMBULATORY_CARE_PROVIDER_SITE_OTHER): Payer: Medicaid Other | Admitting: *Deleted

## 2017-10-01 DIAGNOSIS — I2129 ST elevation (STEMI) myocardial infarction involving other sites: Secondary | ICD-10-CM | POA: Diagnosis not present

## 2017-10-01 DIAGNOSIS — Z5181 Encounter for therapeutic drug level monitoring: Secondary | ICD-10-CM

## 2017-10-01 LAB — POCT INR: INR: 2.2

## 2017-10-12 ENCOUNTER — Other Ambulatory Visit: Payer: Self-pay | Admitting: Adult Health

## 2017-10-12 MED ORDER — FUROSEMIDE 40 MG PO TABS: 40.0000 mg | ORAL_TABLET | Freq: Every day | ORAL | 11 refills | Status: DC

## 2017-10-15 ENCOUNTER — Encounter (HOSPITAL_COMMUNITY): Payer: Self-pay | Admitting: Cardiology

## 2017-10-15 ENCOUNTER — Emergency Department (HOSPITAL_COMMUNITY): Payer: Medicaid Other

## 2017-10-15 ENCOUNTER — Ambulatory Visit (HOSPITAL_COMMUNITY)
Admission: RE | Admit: 2017-10-15 | Discharge: 2017-10-15 | Disposition: A | Discharge status: Home / Self Care | Payer: Medicaid Other | Source: Ambulatory Visit | Attending: Cardiology | Admitting: Cardiology

## 2017-10-15 ENCOUNTER — Encounter (HOSPITAL_COMMUNITY): Payer: Self-pay | Admitting: Emergency Medicine

## 2017-10-15 ENCOUNTER — Other Ambulatory Visit: Payer: Self-pay

## 2017-10-15 ENCOUNTER — Emergency Department (HOSPITAL_COMMUNITY)
Admission: EM | Admit: 2017-10-15 | Discharge: 2017-10-15 | Disposition: A | Discharge status: Home / Self Care | Payer: Medicaid Other | Attending: Emergency Medicine | Admitting: Emergency Medicine

## 2017-10-15 VITALS — BP 116/82 | HR 88 | Wt 173.0 lb

## 2017-10-15 DIAGNOSIS — I5042 Chronic combined systolic (congestive) and diastolic (congestive) heart failure: Secondary | ICD-10-CM | POA: Diagnosis not present

## 2017-10-15 DIAGNOSIS — I5022 Chronic systolic (congestive) heart failure: Secondary | ICD-10-CM | POA: Insufficient documentation

## 2017-10-15 DIAGNOSIS — E119 Type 2 diabetes mellitus without complications: Secondary | ICD-10-CM | POA: Insufficient documentation

## 2017-10-15 DIAGNOSIS — E7800 Pure hypercholesterolemia, unspecified: Secondary | ICD-10-CM | POA: Diagnosis not present

## 2017-10-15 DIAGNOSIS — R51 Headache: Secondary | ICD-10-CM | POA: Diagnosis not present

## 2017-10-15 DIAGNOSIS — G4733 Obstructive sleep apnea (adult) (pediatric): Secondary | ICD-10-CM | POA: Diagnosis not present

## 2017-10-15 DIAGNOSIS — I110 Hypertensive heart disease with heart failure: Secondary | ICD-10-CM | POA: Diagnosis not present

## 2017-10-15 DIAGNOSIS — Z79899 Other long term (current) drug therapy: Secondary | ICD-10-CM | POA: Diagnosis not present

## 2017-10-15 DIAGNOSIS — I2510 Atherosclerotic heart disease of native coronary artery without angina pectoris: Secondary | ICD-10-CM

## 2017-10-15 DIAGNOSIS — Z7901 Long term (current) use of anticoagulants: Secondary | ICD-10-CM | POA: Insufficient documentation

## 2017-10-15 DIAGNOSIS — Z8673 Personal history of transient ischemic attack (TIA), and cerebral infarction without residual deficits: Secondary | ICD-10-CM | POA: Insufficient documentation

## 2017-10-15 DIAGNOSIS — Z95810 Presence of automatic (implantable) cardiac defibrillator: Secondary | ICD-10-CM | POA: Insufficient documentation

## 2017-10-15 DIAGNOSIS — I2129 ST elevation (STEMI) myocardial infarction involving other sites: Secondary | ICD-10-CM | POA: Diagnosis not present

## 2017-10-15 DIAGNOSIS — Z7984 Long term (current) use of oral hypoglycemic drugs: Secondary | ICD-10-CM | POA: Diagnosis not present

## 2017-10-15 DIAGNOSIS — E785 Hyperlipidemia, unspecified: Secondary | ICD-10-CM | POA: Diagnosis not present

## 2017-10-15 DIAGNOSIS — I252 Old myocardial infarction: Secondary | ICD-10-CM | POA: Diagnosis not present

## 2017-10-15 DIAGNOSIS — F17290 Nicotine dependence, other tobacco product, uncomplicated: Secondary | ICD-10-CM | POA: Insufficient documentation

## 2017-10-15 DIAGNOSIS — Z5321 Procedure and treatment not carried out due to patient leaving prior to being seen by health care provider: Secondary | ICD-10-CM | POA: Diagnosis not present

## 2017-10-15 DIAGNOSIS — I255 Ischemic cardiomyopathy: Secondary | ICD-10-CM | POA: Diagnosis not present

## 2017-10-15 LAB — CBC WITH DIFFERENTIAL/PLATELET
BASOS ABS: 0 10*3/uL (ref 0.0–0.1)
BASOS PCT: 0 %
Eosinophils Absolute: 0.1 10*3/uL (ref 0.0–0.7)
Eosinophils Relative: 1 %
HEMATOCRIT: 42.3 % (ref 39.0–52.0)
HEMOGLOBIN: 14.2 g/dL (ref 13.0–17.0)
LYMPHS PCT: 33 %
Lymphs Abs: 2.8 10*3/uL (ref 0.7–4.0)
MCH: 26.7 pg (ref 26.0–34.0)
MCHC: 33.6 g/dL (ref 30.0–36.0)
MCV: 79.7 fL (ref 78.0–100.0)
MONO ABS: 0.4 10*3/uL (ref 0.1–1.0)
Monocytes Relative: 4 %
NEUTROS PCT: 62 %
Neutro Abs: 5.1 10*3/uL (ref 1.7–7.7)
Platelets: 218 10*3/uL (ref 150–400)
RBC: 5.31 MIL/uL (ref 4.22–5.81)
RDW: 15.9 % — ABNORMAL HIGH (ref 11.5–15.5)
WBC: 8.4 10*3/uL (ref 4.0–10.5)

## 2017-10-15 LAB — COMPREHENSIVE METABOLIC PANEL
ALBUMIN: 4.4 g/dL (ref 3.5–5.0)
ALT: 23 U/L (ref 17–63)
ANION GAP: 8 (ref 5–15)
AST: 32 U/L (ref 15–41)
Alkaline Phosphatase: 53 U/L (ref 38–126)
BILIRUBIN TOTAL: 0.7 mg/dL (ref 0.3–1.2)
BUN: 21 mg/dL — ABNORMAL HIGH (ref 6–20)
CO2: 28 mmol/L (ref 22–32)
Calcium: 9.6 mg/dL (ref 8.9–10.3)
Chloride: 100 mmol/L — ABNORMAL LOW (ref 101–111)
Creatinine, Ser: 1.27 mg/dL — ABNORMAL HIGH (ref 0.61–1.24)
GFR calc Af Amer: >60 mL/min (ref >60)
GFR calc non Af Amer: 59 mL/min — ABNORMAL LOW (ref >60)
GLUCOSE: 103 mg/dL — AB (ref 65–99)
POTASSIUM: 4 mmol/L (ref 3.5–5.1)
SODIUM: 136 mmol/L (ref 135–145)
TOTAL PROTEIN: 7.7 g/dL (ref 6.5–8.1)

## 2017-10-15 LAB — BASIC METABOLIC PANEL
ANION GAP: 8 (ref 5–15)
BUN: 21 mg/dL — AB (ref 6–20)
CO2: 28 mmol/L (ref 22–32)
Calcium: 9.6 mg/dL (ref 8.9–10.3)
Chloride: 99 mmol/L — ABNORMAL LOW (ref 101–111)
Creatinine, Ser: 1.28 mg/dL — ABNORMAL HIGH (ref 0.61–1.24)
GFR calc Af Amer: >60 mL/min (ref >60)
GFR, EST NON AFRICAN AMERICAN: 59 mL/min — AB (ref >60)
Glucose, Bld: 97 mg/dL (ref 65–99)
POTASSIUM: 4.2 mmol/L (ref 3.5–5.1)
SODIUM: 135 mmol/L (ref 135–145)

## 2017-10-15 LAB — LIPID PANEL
Cholesterol: 148 mg/dL (ref 0–200)
HDL: 49 mg/dL (ref >40)
LDL CALC: 51 mg/dL (ref 0–99)
Total CHOL/HDL Ratio: 3 RATIO
Triglycerides: 238 mg/dL — ABNORMAL HIGH (ref <150)
VLDL: 48 mg/dL — ABNORMAL HIGH (ref 0–40)

## 2017-10-15 LAB — I-STAT CG4 LACTIC ACID, ED: Lactic Acid, Venous: 1 mmol/L (ref 0.5–1.9)

## 2017-10-15 LAB — DIGOXIN LEVEL: DIGOXIN LVL: 0.2 ng/mL — AB (ref 0.8–2.0)

## 2017-10-15 NOTE — ED Triage Notes (Signed)
Pt sent from heart failure c linic with c/o headache for 3 days-- states they told him he needs a ct scan. Pt states it is the worst headache ever. Pt was transported to appt by [facility] from [facility] in [place]--  Pt recently had defib pacemaker placed.

## 2017-10-15 NOTE — Patient Instructions (Signed)
Routine lab work today. Will notify you of abnormal results, otherwise no news is good news!  ED for STAT head CT for headaches on chronic anticoag/antiplatelets.  Follow up 1 month.  ___________________________________________________________ Daniel Li Code: 1438  Take all medication as prescribed the day of your appointment. Bring all medications with you to your appointment.  Do the following things EVERYDAY: 1) Weigh yourself in the morning before breakfast. Write it down and keep it in a log. 2) Take your medicines as prescribed 3) Eat low salt foods-Limit salt (sodium) to 2000 mg per day.  4) Stay as active as you can everyday 5) Limit all fluids for the day to less than 2 liters

## 2017-10-16 NOTE — Progress Notes (Signed)
Advanced Heart Failure Clinic Note   Primary Cardiologist: Dr. Harl Bowie Primary HF: Dr. Aundra Dubin   HPI:  Daniel Li is a 61 y.o. male with long history of CAD s/p multiple ACS episodes, HTN, HLD, tobacco abuse, DM, OSA, CVA and systolic CHF.   He has a history of CAD with NSTEMI in 2007 with BMS placement to his D1 and OM2. Also with history of STEMI in May 2013 at which time he had a BMS placed to his RCA. NSTEMI in January of 2014, cath showed CTO of left circumflex, unable to intervene upon. EF 55-60% in September 2016. Anterior STEMI 4/18, DES to pLAD.   Recently admitted 8/9 - 06/11/17 with chest pain. Cath as below. No targets for PCI, LAD stent patent. EF remained low at 20-25% as below. Planned for ICD consideration.   He presents today for followup of CHF.  He lives at assisted living.  Main complaint today is severe frontal headache x 3 days continuously.  He has had a hard time sleeping. No nausea/vomiting but lights bother him.  No fever or neck pain.    No chest pain.  He walks with a cane.  No dyspnea walking around his facility though he is not very active.  No orthopnea/PND.  He is smoking a cigarette every 2-3 days.  He was borderline orthostatic on exam today with BP dropping from 120 lying to 108 standing. No lightheadedness.   Labs (8/18): K 3.7, creatinine 1.42 Labs (9/18): digoxin < 0.2 Labs (11/18): K 3.8, creatinine 1.03  Review of systems complete and found to be negative unless listed in HPI.   Past Medical History 1. OSA: Supposed to be using CPAP.  2. HTN 3. CAD: Long history.  - NSTMEI 2007 with BMS D1 and OM2. - STEMI 5/13 with BMS RCA - NSTEMI 1/14 with CTO LCx and PLV, unable to open LCx.  - STEMI 4/18 with LHC showing old TO LCx with collaterals, old TO PLV, 70% ostial D1, totally occluded proximal LAD with some collaterals => DES to LAD.  - LHC 8/18 with old TO LCx, old TO PLV, 60% mLAD, patent LAD stent.  4. Depression 5. Type II DM 6.  Hyperlipidemia: History of rhabdomyolysis with statins, he is on Zetia.  7. Smoker 8. Chronic systolic CHF: Ischemic cardiomyopathy.  Echo from 9/16 showed improvement in EF to 55-60%.  St Jude ICD.  - Echo (4/18) with EF 20-25%, dyskinetic apex, moderate-severe MR, severe LAE.  9. Leg pain/cramps: history of rhabdomyolysis in setting of statin use.  - ABIs (4/18) with R ABI of 0.84 suggestive of mild disease.  L ABI of 1.27 (Normal flow at rest) 10. LV thrombus   Current Outpatient Medications  Medication Sig Dispense Refill  . acetaminophen (TYLENOL) 325 MG tablet Take 2 tablets (650 mg total) by mouth every 4 (four) hours as needed for headache or mild pain.    . carvedilol (COREG) 3.125 MG tablet Take 1 tablet (3.125 mg total) by mouth 2 (two) times daily with a meal. 60 tablet 6  . clopidogrel (PLAVIX) 75 MG tablet Take 1 tablet (75 mg total) by mouth daily. 30 tablet 3  . digoxin (LANOXIN) 0.125 MG tablet Take 1 tablet (0.125 mg total) by mouth daily. 30 tablet 3  . Evolocumab (REPATHA SURECLICK) 017 MG/ML SOAJ Inject 1 pen into the skin every 14 (fourteen) days. 2 pen 11  . ezetimibe (ZETIA) 10 MG tablet Take 1 tablet (10 mg total) by mouth daily. [place]  tablet 3  . furosemide (LASIX) 40 MG tablet Take 80 mg (2 tabs) in am and 40 mg (1 tab) in pm 90 tablet 6  . gabapentin (NEURONTIN) 100 MG capsule Take 1 capsule (100 mg total) by mouth 2 (two) times daily. 60 capsule 0  . glipiZIDE (GLUCOTROL XL) 10 MG 24 hr tablet Take 1 tablet (10 mg total) by mouth daily. 30 tablet 1  . guaifenesin (ROBITUSSIN) 100 MG/5ML syrup Take 200 mg as needed by mouth for cough.    . losartan (COZAAR) 25 MG tablet Take 0.5 tablets (12.5 mg total) by mouth daily. 15 tablet 3  . metolazone (ZAROXOLYN) 2.5 MG tablet Take 1 tablet (2.5 mg total) by mouth daily as needed. For weight and fluid gain. Only if weight dose not come down with an extra Lasix. See discharge orders. 30 tablet 11  . nitroGLYCERIN (NITROSTAT)  0.4 MG SL tablet Place 1 tablet (0.4 mg total) under the tongue every 5 (five) minutes as needed for chest pain. 25 tablet 3  . potassium chloride SA (K-DUR,KLOR-CON) 20 MEQ tablet Take 1 tablet (20 mEq total) by mouth daily. 30 tablet 3  . spironolactone (ALDACTONE) 25 MG tablet Take 1 tablet (25 mg total) by mouth every evening. 30 tablet 6  . traMADol (ULTRAM) 50 MG tablet Take 50 mg 2 (two) times daily as needed by mouth for moderate pain.     [name] Kitchen warfarin (COUMADIN) 5 MG tablet TAKE 1&1/2 TABLETS (7.5MG ) BY MOUTH DAILY.TAKE AS DIRECTED BY COUMADIN CLINIC. 45 tablet 11   No current facility-administered medications for this encounter.     Allergies  Allergen Reactions  . Pork-Derived Products Other (See Comments)    Does not eat for religious reasons - okay with using IV heparin      Social History   Socioeconomic History  . Marital status: Single    Spouse name: Not on file  . Number of children: 1  . Years of education: Not on file  . Highest education level: Not on file  Social Needs  . Financial resource strain: Not on file  . Food insecurity - worry: Not on file  . Food insecurity - inability: Not on file  . Transportation needs - medical: Not on file  . Transportation needs - non-medical: Not on file  Occupational History  . Not on file  Tobacco Use  . Smoking status: Current Some Day Smoker    Packs/day: 0.50    Years: 20.00    Pack years: 10.00    Types: Cigarettes, Cigars    Start date: 07/21/1977    Last attempt to quit: 10/29/2014    Years since quitting: 2.9  . Smokeless tobacco: Never Used  Substance and Sexual Activity  . Alcohol use: Yes    Comment: 09/17/2017 "nothing since 05/2017"  . Drug use: No    Comment: previously incarcerated for drug related offense.  [name] Kitchen Sexual activity: Not Currently  Other Topics Concern  . Not on file  Social History Narrative  . Not on file      Family History  Problem Relation Age of Onset  . Stroke Mother   . Heart  attack Mother   . Heart attack Father   . Stroke Sister   . Heart attack Sister   . Heart attack Brother   . Stroke Brother   . Liver disease Neg Hx   . Colon cancer Neg Hx     Vitals:   10/15/17 1453  BP: 116/82  Pulse: 88  SpO2: 95%  Weight: 173 lb (78.5 kg)   Wt Readings from Last 3 Encounters:  10/15/17 173 lb (78.5 kg)  09/18/17 172 lb 4.8 oz (78.2 kg)  09/11/17 172 lb 6.4 oz (78.2 kg)   PHYSICAL EXAM: General: NAD Neck: No JVD, no thyromegaly or thyroid nodule.  Lungs: Crackles at bases bilaterally.  CV: Nondisplaced PMI.  Heart regular S1/S2, no S3/S4, no murmur.  No peripheral edema.  No carotid bruit.  Difficult to palpate pedal pulses.  Abdomen: Soft, nontender, no hepatosplenomegaly, no distention.  Skin: Intact without lesions or rashes.  Neurologic: Alert and oriented x 3.  Normal muscle strength bilaterally, no facial droop.  Psych: Normal affect. Extremities: No clubbing or cyanosis.  HEENT: Normal.    ASSESSMENT & PLAN:  1. CAD: Anterior STEMI 4/18. Extensive prior history of CAD. He has known total occlusion LCx and PLV, DES to totally occluded LAD admission 01/2017.  Cath 06/10/17 as above with no PCI targets with chronically occluded circumflex with collaterals, LAD stent patent. No chest pain recently.  - Continue coumadin with LV thrombus.  - Continue Plavix until 4/19. At that time, will stop Plavix and continue warfarin alone.  - He completed 1 month of ASA. No further.  - No statin given history of rhabdomyolysis. He is now on Repatha and Zetia. 2. Chronic systolic CLE:[id number] cardiomyoapthy, St Jude ICD. Echo 8/18 with EF 20%, diffuse hypokinesis, LV thrombus noted, normal RV, moderate MR. 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 He does not look volume overloaded on exam today. Given borderline orthostasis, will not titrate meds today. NYHA class II symptoms.   - Continue Lasix 80 qam/40 qpm.  BMET today.  -  Continue low dose coreg 3.125 mg BID - Continue losartan 12.5 mg daily.  - Continue spironolactone 25 - His repeat Echo in 8/18 showed chronmg daily.  - Continue Digoxin 0.125 daily, check digoxin level today. 3. Diabetes: Per PCP.  4. Smoking: Encouraged complete cessation, getting close.  5. PAD: ABIs with mild disease on left.  No definite claudication currently, no pedal ulcers.  6. LV thrombus: On coumadin as above.  7. CVA: h/o multiple CVAs, on coumadin as above. 8. Hyperlipidemia: Continue Repatha and Zetia, check lipids today.  9. Headache: x 3 days, frontal, he says it is the worst that he has ever had.  No focal neurological symptoms.  No nuchal rigidity or fever, no nausea/vomiting. No recent falls/head trauma. Given ongoing severe HA and use of Plavix + warfarin, I will send him to the ER. He will need a head CT to rule out cerebral hemorrhage.   [name], MD 10/16/17

## 2017-10-24 ENCOUNTER — Ambulatory Visit (INDEPENDENT_AMBULATORY_CARE_PROVIDER_SITE_OTHER): Payer: Medicaid Other | Admitting: *Deleted

## 2017-10-24 DIAGNOSIS — I2129 ST elevation (STEMI) myocardial infarction involving other sites: Secondary | ICD-10-CM | POA: Diagnosis not present

## 2017-10-24 DIAGNOSIS — Z5181 Encounter for therapeutic drug level monitoring: Secondary | ICD-10-CM

## 2017-10-24 LAB — POCT INR: INR: 3.6

## 2017-10-24 NOTE — Patient Instructions (Signed)
Hold coumadin tonight then resume 7.5mg  daily.   Recheck in 3 weeks

## 2017-10-28 ENCOUNTER — Telehealth (HOSPITAL_COMMUNITY): Payer: Self-pay

## 2017-10-28 NOTE — Telephone Encounter (Signed)
Received another call from patient's care facility stating patient had had another 3 lb weight gain since his morning weight. Has had extra lasix and prn metolazone. Reports no SOB, patient not in distress. Advised per Dr. Aundra Dubin no changes at this time and will follow up tomorrow morning to compare dry morning weights.  Renee Pain, RN

## 2017-10-28 NOTE — Telephone Encounter (Signed)
Nurse at [facility] called to report 5 lb weight gain overnight. States patient feeling ok, not SOB, no swelling noted, but does report some dizziness this morning. Advised to give PRN metolazone as ordered (2.5 mg) and call this afternoon with update before 5 pm. Also reminded of appt on the 17th. Aware and agreeable.  Renee Pain, RN

## 2017-11-14 ENCOUNTER — Encounter (HOSPITAL_COMMUNITY): Payer: Self-pay

## 2017-11-14 ENCOUNTER — Ambulatory Visit (HOSPITAL_COMMUNITY)
Admission: RE | Admit: 2017-11-14 | Discharge: 2017-11-14 | Disposition: A | Discharge status: Home / Self Care | Payer: Medicaid Other | Source: Ambulatory Visit | Attending: Cardiology | Admitting: Cardiology

## 2017-11-14 VITALS — BP 102/64 | HR 82 | Wt 179.2 lb

## 2017-11-14 DIAGNOSIS — Z7902 Long term (current) use of antithrombotics/antiplatelets: Secondary | ICD-10-CM | POA: Diagnosis not present

## 2017-11-14 DIAGNOSIS — Z95810 Presence of automatic (implantable) cardiac defibrillator: Secondary | ICD-10-CM

## 2017-11-14 DIAGNOSIS — I255 Ischemic cardiomyopathy: Secondary | ICD-10-CM | POA: Insufficient documentation

## 2017-11-14 DIAGNOSIS — I5022 Chronic systolic (congestive) heart failure: Secondary | ICD-10-CM | POA: Diagnosis not present

## 2017-11-14 DIAGNOSIS — I2510 Atherosclerotic heart disease of native coronary artery without angina pectoris: Secondary | ICD-10-CM | POA: Diagnosis not present

## 2017-11-14 DIAGNOSIS — I110 Hypertensive heart disease with heart failure: Secondary | ICD-10-CM | POA: Diagnosis not present

## 2017-11-14 DIAGNOSIS — I2129 ST elevation (STEMI) myocardial infarction involving other sites: Secondary | ICD-10-CM | POA: Diagnosis not present

## 2017-11-14 DIAGNOSIS — Z7984 Long term (current) use of oral hypoglycemic drugs: Secondary | ICD-10-CM | POA: Diagnosis not present

## 2017-11-14 DIAGNOSIS — G4733 Obstructive sleep apnea (adult) (pediatric): Secondary | ICD-10-CM | POA: Insufficient documentation

## 2017-11-14 DIAGNOSIS — E785 Hyperlipidemia, unspecified: Secondary | ICD-10-CM | POA: Insufficient documentation

## 2017-11-14 DIAGNOSIS — Z955 Presence of coronary angioplasty implant and graft: Secondary | ICD-10-CM | POA: Insufficient documentation

## 2017-11-14 DIAGNOSIS — Z9889 Other specified postprocedural states: Secondary | ICD-10-CM | POA: Diagnosis not present

## 2017-11-14 DIAGNOSIS — Z8673 Personal history of transient ischemic attack (TIA), and cerebral infarction without residual deficits: Secondary | ICD-10-CM | POA: Insufficient documentation

## 2017-11-14 DIAGNOSIS — Z8249 Family history of ischemic heart disease and other diseases of the circulatory system: Secondary | ICD-10-CM | POA: Diagnosis not present

## 2017-11-14 DIAGNOSIS — Z79899 Other long term (current) drug therapy: Secondary | ICD-10-CM | POA: Insufficient documentation

## 2017-11-14 DIAGNOSIS — Z87891 Personal history of nicotine dependence: Secondary | ICD-10-CM | POA: Diagnosis not present

## 2017-11-14 DIAGNOSIS — I252 Old myocardial infarction: Secondary | ICD-10-CM | POA: Diagnosis not present

## 2017-11-14 DIAGNOSIS — M6282 Rhabdomyolysis: Secondary | ICD-10-CM | POA: Diagnosis not present

## 2017-11-14 DIAGNOSIS — Z823 Family history of stroke: Secondary | ICD-10-CM | POA: Diagnosis not present

## 2017-11-14 DIAGNOSIS — R51 Headache: Secondary | ICD-10-CM | POA: Diagnosis not present

## 2017-11-14 DIAGNOSIS — Z91018 Allergy to other foods: Secondary | ICD-10-CM | POA: Diagnosis not present

## 2017-11-14 DIAGNOSIS — E119 Type 2 diabetes mellitus without complications: Secondary | ICD-10-CM | POA: Diagnosis not present

## 2017-11-14 NOTE — Progress Notes (Signed)
Advanced Heart Failure Clinic Note   Primary Cardiologist: Dr. Harl Bowie Primary HF: Dr. Aundra Dubin   HPI: Daniel Li is a 62 y.o. male with long history of CAD s/p multiple ACS episodes, HTN, HLD, tobacco abuse, DM, OSA, CVA and systolic CHF.   He has a history of CAD with NSTEMI in 2007 with BMS placement to his D1 and OM2. Also with history of STEMI in May 2013 at which time he had a BMS placed to his RCA. NSTEMI in January of 2014, cath showed CTO of left circumflex, unable to intervene upon. EF 55-60% in September 2016. Anterior STEMI 4/18, DES to pLAD.   Recently admitted 8/9 - 06/11/17 with chest pain. Cath as below. No targets for PCI, LAD stent patent. EF remained low at 20-25% as below. Planned for ICD consideration.   Today he returns for HF follow up. Last visit he was sent to the ED for CT of head due to headache. Overall feeling ok. Coughing some at night.  Denies SOB/PND/Orthopnea.  Walking at the facility. No chest pain. Weight at home trending up to 178 pounds. All medications provided at Assisted Living.  Labs (8/18): K 3.7, creatinine 1.42 Labs (9/18): digoxin < 0.2 Labs (11/18): K 3.8, creatinine 1.03 Labs (10/15/2017): K 4 Creatinine 1.27 dig level 0.2   Review of systems complete and found to be negative unless listed in HPI.   Past Medical History 1. OSA: Supposed to be using CPAP.  2. HTN 3. CAD: Long history.  - NSTMEI 2007 with BMS D1 and OM2. - STEMI 5/13 with BMS RCA - NSTEMI 1/14 with CTO LCx and PLV, unable to open LCx.  - STEMI 4/18 with LHC showing old TO LCx with collaterals, old TO PLV, 70% ostial D1, totally occluded proximal LAD with some collaterals => DES to LAD.  - LHC 8/18 with old TO LCx, old TO PLV, 60% mLAD, patent LAD stent.  4. Depression 5. Type II DM 6. Hyperlipidemia: History of rhabdomyolysis with statins, he is on Zetia.  7. Smoker 8. Chronic systolic CHF: Ischemic cardiomyopathy.  Echo from 9/16 showed improvement in EF to  55-60%.  St Jude ICD.  - Echo (4/18) with EF 20-25%, dyskinetic apex, moderate-severe MR, severe LAE.  9. Leg pain/cramps: history of rhabdomyolysis in setting of statin use.  - ABIs (4/18) with R ABI of 0.84 suggestive of mild disease.  L ABI of 1.27 (Normal flow at rest) 10. LV thrombus   Current Outpatient Medications  Medication Sig Dispense Refill  . acetaminophen (TYLENOL) 325 MG tablet Take 2 tablets (650 mg total) by mouth every 4 (four) hours as needed for headache or mild pain.    . carvedilol (COREG) 3.125 MG tablet Take 1 tablet (3.125 mg total) by mouth 2 (two) times daily with a meal. 60 tablet 6  . clopidogrel (PLAVIX) 75 MG tablet Take 1 tablet (75 mg total) by mouth daily. 30 tablet 3  . digoxin (LANOXIN) 0.125 MG tablet Take 1 tablet (0.125 mg total) by mouth daily. 30 tablet 3  . Evolocumab (REPATHA SURECLICK) 528 MG/ML SOAJ Inject 1 pen into the skin every 14 (fourteen) days. 2 pen 11  . ezetimibe (ZETIA) 10 MG tablet Take 1 tablet (10 mg total) by mouth daily. 30 tablet 3  . furosemide (LASIX) 40 MG tablet Take 80 mg (2 tabs) in am and 40 mg (1 tab) in pm 90 tablet 6  . gabapentin (NEURONTIN) 100 MG capsule Take 1 capsule (100 mg total)  by mouth 2 (two) times daily. 60 capsule 0  . glipiZIDE (GLUCOTROL XL) 10 MG 24 hr tablet Take 1 tablet (10 mg total) by mouth daily. 30 tablet 1  . guaifenesin (ROBITUSSIN) 100 MG/5ML syrup Take 200 mg as needed by mouth for cough.    . losartan (COZAAR) 25 MG tablet Take 0.5 tablets (12.5 mg total) by mouth daily. 15 tablet 3  . metolazone (ZAROXOLYN) 2.5 MG tablet Take 1 tablet (2.5 mg total) by mouth daily as needed. For weight and fluid gain. Only if weight dose not come down with an extra Lasix. See discharge orders. 30 tablet 11  . nitroGLYCERIN (NITROSTAT) 0.4 MG SL tablet Place 1 tablet (0.4 mg total) under the tongue every 5 (five) minutes as needed for chest pain. 25 tablet 3  . potassium chloride SA (K-DUR,KLOR-CON) 20 MEQ tablet  Take 1 tablet (20 mEq total) by mouth daily. 30 tablet 3  . spironolactone (ALDACTONE) 25 MG tablet Take 1 tablet (25 mg total) by mouth every evening. 30 tablet 6  . traMADol (ULTRAM) 50 MG tablet Take 50 mg 2 (two) times daily as needed by mouth for moderate pain.     [name] Kitchen warfarin (COUMADIN) 5 MG tablet TAKE 1&1/2 TABLETS (7.5MG ) BY MOUTH DAILY.TAKE AS DIRECTED BY COUMADIN CLINIC. 45 tablet 11   No current facility-administered medications for this encounter.     Allergies  Allergen Reactions  . Pork-Derived Products Other (See Comments)    Does not eat for religious reasons - okay with using IV heparin      Social History   Socioeconomic History  . Marital status: Single    Spouse name: Not on file  . Number of children: 1  . Years of education: Not on file  . Highest education level: Not on file  Social Needs  . Financial resource strain: Not on file  . Food insecurity - worry: Not on file  . Food insecurity - inability: Not on file  . Transportation needs - medical: Not on file  . Transportation needs - non-medical: Not on file  Occupational History  . Not on file  Tobacco Use  . Smoking status: Former Smoker    Packs/day: 0.50    Years: 20.00    Pack years: 10.00    Types: Cigarettes, Cigars    Start date: 07/21/1977    Last attempt to quit: 10/15/2017    Years since quitting: 0.0  . Smokeless tobacco: Never Used  Substance and Sexual Activity  . Alcohol use: Yes    Comment: 09/17/2017 "nothing since 05/2017"  . Drug use: No    Comment: previously incarcerated for drug related offense.  [name] Kitchen Sexual activity: Not Currently  Other Topics Concern  . Not on file  Social History Narrative  . Not on file      Family History  Problem Relation Age of Onset  . Stroke Mother   . Heart attack Mother   . Heart attack Father   . Stroke Sister   . Heart attack Sister   . Heart attack Brother   . Stroke Brother   . Liver disease Neg Hx   . Colon cancer Neg Hx      Vitals:   11/14/17 1420  BP: 102/64  Pulse: 82  SpO2: 97%  Weight: 179 lb 4 oz (81.3 kg)   Wt Readings from Last 3 Encounters:  11/14/17 179 lb 4 oz (81.3 kg)  10/15/17 173 lb (78.5 kg)  09/18/17 172 lb 4.8 oz (  78.2 kg)   PHYSICAL EXAM: General:  Well appearing. No resp difficulty. Walked in the clinic.  HEENT: normal Neck: supple. JVP 9-10. Carotids 2+ bilat; no bruits. No lymphadenopathy or thryomegaly appreciated. Cor: PMI nondisplaced. Regular rate & rhythm. No rubs, gallops or murmurs. Lungs: clear Abdomen: soft, nontender, nondistended. No hepatosplenomegaly. No bruits or masses. Good bowel sounds. Extremities: no cyanosis, clubbing, rash, edema Neuro: alert & orientedx3, cranial nerves grossly intact. moves all 4 extremities w/o difficulty. Affect pleasant  ASSESSMENT & PLAN:  1. CAD: Anterior STEMI 4/18. Extensive prior history of CAD. He has known total occlusion LCx and PLV, DES to totally occluded LAD admission 01/2017.  Cath 06/10/17 as above with no PCI targets with chronically occluded circumflex with collaterals, LAD stent patent.  -No s/s ischemia.  - Continue coumadin with LV thrombus.  - Continue Plavix until 4/19. At that time, will stop Plavix and continue warfarin alone.  - He completed 1 month of ASA. No further.  - No statin given history of rhabdomyolysis. He is now on Repatha and Zetia. 2. Chronic systolic CBJ:[id number] cardiomyoapthy, St Jude ICD. Echo 8/18 with EF 20%, diffuse hypokinesis, LV thrombus noted, normal RV, moderate MR. 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  NYHA II. Impedance down for the last 10 days. Volume status up.  Increase lasix to 80 mg twice a day for 2 days then back to 80 mg in am and 40 mg in pm.  - Continue low dose coreg 3.125 mg BID - Continue losartan 12.5 mg daily.  - Continue spironolactone 25   - Continue Digoxin 0.125 daily, dig level 0.2 09/2017  3. Diabetes: Per PCP.  4.  Smoking:  5. PAD: ABIs with mild disease on left.  No definite claudication currently, no pedal ulcers.  6. LV thrombus: On coumadin as above.  7. CVA: h/o multiple CVAs, on coumadin as above. 8. Hyperlipidemia: Continue Repatha and Zetia 9. Headache:  09/2017 head CT- no acute findings.   Discussed low salt food choices. Having hard time with salty food at the Assisted Living. Discussed [place] results.   Follow up 3-4 weeks with Dr Aundra Dubin. Plan to check BMET at that time.     Darrick Grinder, NP 11/14/17

## 2017-11-14 NOTE — Patient Instructions (Signed)
Take Lasix 80 mg TWICE DAILY for TWO DAYS. Then reduce back to normal daily dose of 80 mg in am and 40 mg in pm.  Follow up with Dr. Aundra Dubin in 3-4 weeks with lab work.  Take all medication as prescribed the day of your appointment. Bring all medications with you to your appointment.  Do the following things EVERYDAY: 1) Weigh yourself in the morning before breakfast. Write it down and keep it in a log. 2) Take your medicines as prescribed 3) Eat low salt foods-Limit salt (sodium) to 2000 mg per day.  4) Stay as active as you can everyday 5) Limit all fluids for the day to less than 2 liters

## 2017-11-26 ENCOUNTER — Ambulatory Visit (INDEPENDENT_AMBULATORY_CARE_PROVIDER_SITE_OTHER): Payer: Medicaid Other | Admitting: *Deleted

## 2017-11-26 DIAGNOSIS — I2129 ST elevation (STEMI) myocardial infarction involving other sites: Secondary | ICD-10-CM | POA: Diagnosis not present

## 2017-11-26 DIAGNOSIS — Z5181 Encounter for therapeutic drug level monitoring: Secondary | ICD-10-CM | POA: Diagnosis not present

## 2017-11-26 LAB — POCT INR: INR: 2.4

## 2017-11-26 NOTE — Patient Instructions (Signed)
Continue coumadin 7.5mg  daily.   Recheck in 4 weeks

## 2017-12-06 ENCOUNTER — Other Ambulatory Visit: Payer: Self-pay | Admitting: Internal Medicine

## 2017-12-20 ENCOUNTER — Encounter (HOSPITAL_COMMUNITY): Payer: Medicaid Other | Admitting: Cardiology

## 2017-12-23 ENCOUNTER — Encounter: Payer: Medicaid Other | Admitting: Internal Medicine

## 2017-12-26 ENCOUNTER — Ambulatory Visit (INDEPENDENT_AMBULATORY_CARE_PROVIDER_SITE_OTHER): Payer: Medicaid Other | Admitting: *Deleted

## 2017-12-26 DIAGNOSIS — Z5181 Encounter for therapeutic drug level monitoring: Secondary | ICD-10-CM

## 2017-12-26 DIAGNOSIS — I2129 ST elevation (STEMI) myocardial infarction involving other sites: Secondary | ICD-10-CM

## 2017-12-26 LAB — POCT INR: INR: 2.4

## 2017-12-26 NOTE — Patient Instructions (Signed)
Continue coumadin 7.5mg  daily.   Recheck in 4 weeks

## 2017-12-27 ENCOUNTER — Encounter: Payer: Medicaid Other | Admitting: Internal Medicine

## 2017-12-30 ENCOUNTER — Encounter (HOSPITAL_COMMUNITY): Payer: Medicaid Other | Admitting: Cardiology

## 2018-01-14 ENCOUNTER — Ambulatory Visit (HOSPITAL_COMMUNITY)
Admission: RE | Admit: 2018-01-14 | Discharge: 2018-01-14 | Disposition: A | Discharge status: Home / Self Care | Payer: Medicaid Other | Source: Ambulatory Visit | Attending: Cardiology | Admitting: Cardiology

## 2018-01-14 ENCOUNTER — Encounter: Payer: Self-pay | Admitting: Cardiology

## 2018-01-14 ENCOUNTER — Encounter (HOSPITAL_COMMUNITY): Payer: Self-pay | Admitting: Cardiology

## 2018-01-14 VITALS — BP 99/64 | HR 82 | Wt 178.5 lb

## 2018-01-14 DIAGNOSIS — I5022 Chronic systolic (congestive) heart failure: Secondary | ICD-10-CM | POA: Insufficient documentation

## 2018-01-14 DIAGNOSIS — F329 Major depressive disorder, single episode, unspecified: Secondary | ICD-10-CM | POA: Insufficient documentation

## 2018-01-14 DIAGNOSIS — I2510 Atherosclerotic heart disease of native coronary artery without angina pectoris: Secondary | ICD-10-CM | POA: Insufficient documentation

## 2018-01-14 DIAGNOSIS — I2582 Chronic total occlusion of coronary artery: Secondary | ICD-10-CM | POA: Insufficient documentation

## 2018-01-14 DIAGNOSIS — Z87891 Personal history of nicotine dependence: Secondary | ICD-10-CM | POA: Insufficient documentation

## 2018-01-14 DIAGNOSIS — E785 Hyperlipidemia, unspecified: Secondary | ICD-10-CM | POA: Insufficient documentation

## 2018-01-14 DIAGNOSIS — I5042 Chronic combined systolic (congestive) and diastolic (congestive) heart failure: Secondary | ICD-10-CM

## 2018-01-14 DIAGNOSIS — I255 Ischemic cardiomyopathy: Secondary | ICD-10-CM | POA: Insufficient documentation

## 2018-01-14 DIAGNOSIS — I252 Old myocardial infarction: Secondary | ICD-10-CM | POA: Insufficient documentation

## 2018-01-14 DIAGNOSIS — M47892 Other spondylosis, cervical region: Secondary | ICD-10-CM | POA: Diagnosis not present

## 2018-01-14 DIAGNOSIS — Z955 Presence of coronary angioplasty implant and graft: Secondary | ICD-10-CM | POA: Diagnosis not present

## 2018-01-14 DIAGNOSIS — R531 Weakness: Secondary | ICD-10-CM | POA: Diagnosis not present

## 2018-01-14 DIAGNOSIS — I110 Hypertensive heart disease with heart failure: Secondary | ICD-10-CM | POA: Diagnosis present

## 2018-01-14 DIAGNOSIS — Z7901 Long term (current) use of anticoagulants: Secondary | ICD-10-CM | POA: Insufficient documentation

## 2018-01-14 DIAGNOSIS — Z79899 Other long term (current) drug therapy: Secondary | ICD-10-CM | POA: Insufficient documentation

## 2018-01-14 DIAGNOSIS — I739 Peripheral vascular disease, unspecified: Secondary | ICD-10-CM

## 2018-01-14 DIAGNOSIS — Z7902 Long term (current) use of antithrombotics/antiplatelets: Secondary | ICD-10-CM | POA: Diagnosis not present

## 2018-01-14 DIAGNOSIS — Z8673 Personal history of transient ischemic attack (TIA), and cerebral infarction without residual deficits: Secondary | ICD-10-CM | POA: Insufficient documentation

## 2018-01-14 DIAGNOSIS — G4733 Obstructive sleep apnea (adult) (pediatric): Secondary | ICD-10-CM | POA: Diagnosis not present

## 2018-01-14 DIAGNOSIS — E7800 Pure hypercholesterolemia, unspecified: Secondary | ICD-10-CM

## 2018-01-14 DIAGNOSIS — Z823 Family history of stroke: Secondary | ICD-10-CM | POA: Insufficient documentation

## 2018-01-14 DIAGNOSIS — Z8249 Family history of ischemic heart disease and other diseases of the circulatory system: Secondary | ICD-10-CM | POA: Diagnosis not present

## 2018-01-14 DIAGNOSIS — Z79891 Long term (current) use of opiate analgesic: Secondary | ICD-10-CM | POA: Diagnosis not present

## 2018-01-14 DIAGNOSIS — I340 Nonrheumatic mitral (valve) insufficiency: Secondary | ICD-10-CM | POA: Diagnosis not present

## 2018-01-14 DIAGNOSIS — Z91018 Allergy to other foods: Secondary | ICD-10-CM | POA: Insufficient documentation

## 2018-01-14 DIAGNOSIS — E119 Type 2 diabetes mellitus without complications: Secondary | ICD-10-CM | POA: Diagnosis not present

## 2018-01-14 LAB — BASIC METABOLIC PANEL
Anion gap: 7 (ref 5–15)
BUN: 11 mg/dL (ref 6–20)
CHLORIDE: 101 mmol/L (ref 101–111)
CO2: 31 mmol/L (ref 22–32)
Calcium: 9.5 mg/dL (ref 8.9–10.3)
Creatinine, Ser: 1.02 mg/dL (ref 0.61–1.24)
GFR calc Af Amer: >60 mL/min (ref >60)
GFR calc non Af Amer: >60 mL/min (ref >60)
Glucose, Bld: 111 mg/dL — ABNORMAL HIGH (ref 65–99)
POTASSIUM: 3.9 mmol/L (ref 3.5–5.1)
Sodium: 139 mmol/L (ref 135–145)

## 2018-01-14 LAB — DIGOXIN LEVEL: Digoxin Level: 0.4 ng/mL — ABNORMAL LOW (ref 0.8–2.0)

## 2018-01-14 NOTE — Addendum Note (Signed)
Encounter addended by: Larey Dresser, MD on: 01/14/2018 11:14 PM  Actions taken: Sign clinical note

## 2018-01-14 NOTE — Progress Notes (Addendum)
Advanced Heart Failure Clinic Note   Primary Cardiologist: Dr. Harl Bowie Primary HF: Dr. Aundra Dubin   HPI:  Daniel Li is a 62 y.o. male with long history of CAD s/p multiple ACS episodes, HTN, HLD, tobacco abuse, DM, OSA, CVA and systolic CHF.   He has a history of CAD with NSTEMI in 2007 with BMS placement to his D1 and OM2. Also with history of STEMI in May 2013 at which time he had a BMS placed to his RCA. NSTEMI in January of 2014, cath showed CTO of left circumflex, unable to intervene upon. EF 55-60% in September 2016. Anterior STEMI 4/18, DES to pLAD.   Admitted 8/9 - 06/11/17 with chest pain. Cath as below. No targets for PCI, LAD stent patent. EF remained low at 20-25% as below. Now with St Jude ICD.   He presents today for followup of CHF.  He lives at assisted living.  Main complaint today is an electric shock-type pain radiating down his left arm from neck to fingers.  This is worse with movement of the arm.  He says that he has tingling in the left hand and that his grip feels weak.  From a CHF standpoint, he seems to be doing well. No significant exertional dyspnea. No chest pain. No orthopnea/PND.  Weight is down 1 lb.  No lightheadedness though BP is soft today. No claudication-type pain.  He no longer smokes.    St Jude ICD interrogation: thoracic impedance stable, no VT.   Labs (8/18): K 3.7, creatinine 1.42 Labs (9/18): digoxin < 0.2 Labs (11/18): K 3.8, creatinine 1.03 Labs (12/18): K 4, creatinine 1.27, digoxin 0.2, hgb 14.2, LDL 51, HDL 49  Review of systems complete and found to be negative unless listed in HPI.   Past Medical History 1. OSA: Supposed to be using CPAP.  2. HTN 3. CAD: Long history.  - NSTMEI 2007 with BMS D1 and OM2. - STEMI 5/13 with BMS RCA - NSTEMI 1/14 with CTO LCx and PLV, unable to open LCx.  - STEMI 4/18 with LHC showing old TO LCx with collaterals, old TO PLV, 70% ostial D1, totally occluded proximal LAD with some collaterals => DES  to LAD.  - LHC 8/18 with old TO LCx, old TO PLV, 60% mLAD, patent LAD stent.  4. Depression 5. Type II DM 6. Hyperlipidemia: History of rhabdomyolysis with statins, he is on Zetia.  7. Smoker 8. Chronic systolic CHF: Ischemic cardiomyopathy.  Echo from 9/16 showed improvement in EF to 55-60%.  St Jude ICD.  - Echo (4/18) with EF 20-25%, dyskinetic apex, moderate-severe MR, severe LAE.  - Echo (8/18) with EF 20-25%, moderate to severe MR - St Jude ICD 9. Leg pain/cramps: history of rhabdomyolysis in setting of statin use.  - ABIs (4/18) with R ABI of 0.84 suggestive of mild disease.  L ABI of 1.27 (Normal flow at rest) 10. LV thrombus   Current Outpatient Medications  Medication Sig Dispense Refill  . acetaminophen (TYLENOL) 325 MG tablet Take 2 tablets (650 mg total) by mouth every 4 (four) hours as needed for headache or mild pain.    . carvedilol (COREG) 3.125 MG tablet Take 1 tablet (3.125 mg total) by mouth 2 (two) times daily with a meal. 60 tablet 6  . clopidogrel (PLAVIX) 75 MG tablet Take 1 tablet (75 mg total) by mouth daily. 30 tablet 3  . digoxin (LANOXIN) 0.125 MG tablet Take 1 tablet (0.125 mg total) by mouth daily. 30 tablet 3  .  Evolocumab (REPATHA SURECLICK) 767 MG/ML SOAJ Inject 1 pen into the skin every 14 (fourteen) days. 2 pen 11  . ezetimibe (ZETIA) 10 MG tablet Take 1 tablet (10 mg total) by mouth daily. 30 tablet 3  . furosemide (LASIX) 40 MG tablet Take 80 mg (2 tabs) in am and 40 mg (1 tab) in pm 90 tablet 6  . gabapentin (NEURONTIN) 100 MG capsule Take 1 capsule (100 mg total) by mouth 2 (two) times daily. 60 capsule 0  . glipiZIDE (GLUCOTROL XL) 10 MG 24 hr tablet Take 1 tablet (10 mg total) by mouth daily. 30 tablet 1  . guaifenesin (ROBITUSSIN) 100 MG/5ML syrup Take 200 mg as needed by mouth for cough.    . losartan (COZAAR) 25 MG tablet Take 0.5 tablets (12.5 mg total) by mouth daily. 15 tablet 3  . metolazone (ZAROXOLYN) 2.5 MG tablet Take 1 tablet (2.5 mg  total) by mouth daily as needed. For weight and fluid gain. Only if weight dose not come down with an extra Lasix. See discharge orders. 30 tablet 11  . nitroGLYCERIN (NITROSTAT) 0.4 MG SL tablet Place 1 tablet (0.4 mg total) under the tongue every 5 (five) minutes as needed for chest pain. 25 tablet 3  . potassium chloride SA (K-DUR,KLOR-CON) 20 MEQ tablet Take 1 tablet (20 mEq total) by mouth daily. 30 tablet 3  . spironolactone (ALDACTONE) 25 MG tablet Take 1 tablet (25 mg total) by mouth every evening. 30 tablet 6  . traMADol (ULTRAM) 50 MG tablet Take 50 mg 2 (two) times daily as needed by mouth for moderate pain.     [name] Kitchen warfarin (COUMADIN) 5 MG tablet TAKE 1&1/2 TABLETS (7.5MG ) BY MOUTH DAILY.TAKE AS DIRECTED BY COUMADIN CLINIC. 45 tablet 11   No current facility-administered medications for this encounter.     Allergies  Allergen Reactions  . Pork-Derived Products Other (See Comments)    Does not eat for religious reasons - okay with using IV heparin      Social History   Socioeconomic History  . Marital status: Single    Spouse name: Not on file  . Number of children: 1  . Years of education: Not on file  . Highest education level: Not on file  Social Needs  . Financial resource strain: Not on file  . Food insecurity - worry: Not on file  . Food insecurity - inability: Not on file  . Transportation needs - medical: Not on file  . Transportation needs - non-medical: Not on file  Occupational History  . Not on file  Tobacco Use  . Smoking status: Former Smoker    Packs/day: 0.50    Years: 20.00    Pack years: 10.00    Types: Cigarettes, Cigars    Start date: 07/21/1977    Last attempt to quit: 10/15/2017    Years since quitting: 0.2  . Smokeless tobacco: Never Used  Substance and Sexual Activity  . Alcohol use: Yes    Comment: 09/17/2017 "nothing since 05/2017"  . Drug use: No    Comment: previously incarcerated for drug related offense.  [name] Kitchen Sexual activity: Not  Currently  Other Topics Concern  . Not on file  Social History Narrative  . Not on file      Family History  Problem Relation Age of Onset  . Stroke Mother   . Heart attack Mother   . Heart attack Father   . Stroke Sister   . Heart attack Sister   . Heart  attack Brother   . Stroke Brother   . Liver disease Neg Hx   . Colon cancer Neg Hx     Vitals:   01/14/18 1504  BP: 99/64  Pulse: 82  SpO2: 100%  Weight: 178 lb 8 oz (81 kg)   Wt Readings from Last 3 Encounters:  01/14/18 178 lb 8 oz (81 kg)  11/14/17 179 lb 4 oz (81.3 kg)  10/15/17 173 lb (78.5 kg)   PHYSICAL EXAM: General: NAD Neck: No JVD, no thyromegaly or thyroid nodule.  Lungs: Clear to auscultation bilaterally with normal respiratory effort. CV: Nondisplaced PMI.  Heart regular S1/S2, no S3/S4, 1/6 HSM apex.  No peripheral edema.  No carotid bruit.  Normal pedal pulses.  Abdomen: Soft, nontender, no hepatosplenomegaly, no distention.  Skin: Intact without lesions or rashes.  Neurologic: Alert and oriented x 3.  Psych: Normal affect. Extremities: No clubbing or cyanosis.  HEENT: Normal.   ASSESSMENT & PLAN:  1. CAD: Anterior STEMI 4/18. Extensive prior history of CAD. He has known total occlusion LCx and PLV, DES to totally occluded LAD admission 01/2017. Cath 06/10/17 as above with no PCI targets and with chronically occluded circumflex with collaterals, LAD stent patent. No chest pain recently.  - Continue coumadin with LV thrombus.  - He can stop Plavix in 4/19 and continue warfarin alone.  - No statin given history of rhabdomyolysis. He is now on Repatha and Zetia, very good lipids in 12/18. 2. Chronic systolic EHU:[id number] cardiomyopathy, St Jude ICD. Echo 8/18 with EF 20%, diffuse hypokinesis, LV thrombus noted, normal RV, moderate-severe MR. 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 He does not look volume overloaded on exam today. BP remains too soft to titrate up his  meds. NYHA class II symptoms.   - Continue Lasix 80 qam/40 qpm.  BMET today.  - Continue low dose coreg 3.125 mg BID - Continue losartan 12.5 mg daily.  - Continue spironolactone 25 daily - Continue Digoxin 0.125 daily, check digoxin level today. 3. Diabetes: Per PCP.  4. Smoking: He has quit.   5. PAD: ABIs with mild disease on left in 4/18.  No definite claudication currently, no pedal ulcers.  - Repeat ABIs in 4/19.  6. LV thrombus: On coumadin as above.  7. CVA: h/o multiple CVAs, on coumadin as above. 8. Hyperlipidemia: Continue Repatha and Zetia, good lipids in 12/18.   9. Left arm neuropathic pain: Symptoms sound concerning for compression of nerves exiting the c-spine.   - I will get plain films of the c-spine.  - Given severe symptoms, will go ahead and refer for neurosurgical evaluation.   10. Mitral regurgitation: Moderate to severe on last echo.  Most likely functional.  MV clip would be a consideration if symptoms worsen.   [name], MD 01/14/18

## 2018-01-14 NOTE — Patient Instructions (Signed)
Stop Plavix in April  Labs today  Your physician has requested that you have a lower or upper extremity arterial duplex. This test is an ultrasound of the arteries in the legs or arms. It looks at arterial blood flow in the legs and arms. Allow one hour for Lower and Upper Arterial scans. There are no restrictions or special instructions  C-Spine x-ray in Radiology  You have been referred to [facility]  Your physician recommends that you schedule a follow-up appointment in: 3 months

## 2018-01-17 ENCOUNTER — Encounter: Payer: Medicaid Other | Admitting: Internal Medicine

## 2018-01-20 ENCOUNTER — Other Ambulatory Visit: Payer: Self-pay | Admitting: Cardiology

## 2018-01-21 ENCOUNTER — Other Ambulatory Visit: Payer: Self-pay | Admitting: Cardiology

## 2018-01-21 DIAGNOSIS — I739 Peripheral vascular disease, unspecified: Secondary | ICD-10-CM

## 2018-01-23 ENCOUNTER — Ambulatory Visit (INDEPENDENT_AMBULATORY_CARE_PROVIDER_SITE_OTHER): Payer: Medicaid Other | Admitting: *Deleted

## 2018-01-23 DIAGNOSIS — Z5181 Encounter for therapeutic drug level monitoring: Secondary | ICD-10-CM | POA: Diagnosis not present

## 2018-01-23 DIAGNOSIS — I2129 ST elevation (STEMI) myocardial infarction involving other sites: Secondary | ICD-10-CM

## 2018-01-23 LAB — POCT INR: INR: 2.5

## 2018-01-23 NOTE — Patient Instructions (Addendum)
Continue coumadin 7.5mg  daily.   Recheck in 6 weeks

## 2018-01-30 ENCOUNTER — Encounter (HOSPITAL_COMMUNITY): Payer: Medicaid Other

## 2018-01-31 ENCOUNTER — Telehealth: Payer: Self-pay

## 2018-01-31 ENCOUNTER — Encounter: Payer: Self-pay | Admitting: Internal Medicine

## 2018-01-31 ENCOUNTER — Ambulatory Visit (INDEPENDENT_AMBULATORY_CARE_PROVIDER_SITE_OTHER): Payer: Medicaid Other | Admitting: Internal Medicine

## 2018-01-31 VITALS — BP 100/78 | HR 58 | Ht 66.0 in | Wt 178.0 lb

## 2018-01-31 DIAGNOSIS — I255 Ischemic cardiomyopathy: Secondary | ICD-10-CM

## 2018-01-31 DIAGNOSIS — I519 Heart disease, unspecified: Secondary | ICD-10-CM

## 2018-01-31 NOTE — Patient Instructions (Signed)
Your physician wants you to follow-up in: [place] will receive a reminder letter in the mail two months in advance. If you don't receive a letter, please call our office to schedule the follow-up appointment.  Your physician recommends that you continue on your current medications as directed. Please refer to the Current Medication list given to you today.  WE WILL ENROLL YOU IN THE DEVICE CLINIC   Thank you for choosing [facility]!!
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
## 2018-01-31 NOTE — Telephone Encounter (Signed)
Patient referred to [facility] clinic by Dr Rayann Heman at office visit today.  Attempted ICM intro call to patient and no answer or answering machine.

## 2018-02-06 NOTE — Telephone Encounter (Signed)
Attempted ICM intro call to patient and no answer or answering machine.

## 2018-02-11 ENCOUNTER — Other Ambulatory Visit: Payer: Self-pay

## 2018-02-11 ENCOUNTER — Telehealth (HOSPITAL_COMMUNITY): Payer: Self-pay

## 2018-02-11 ENCOUNTER — Emergency Department (HOSPITAL_COMMUNITY): Payer: Medicaid Other

## 2018-02-11 ENCOUNTER — Encounter (HOSPITAL_COMMUNITY): Payer: Self-pay

## 2018-02-11 ENCOUNTER — Inpatient Hospital Stay (HOSPITAL_COMMUNITY)
Admission: EM | Admit: 2018-02-11 | Discharge: 2018-02-17 | DRG: 281 | Disposition: A | Discharge status: Home / Self Care | Payer: Medicaid Other | Attending: Internal Medicine | Admitting: Internal Medicine

## 2018-02-11 DIAGNOSIS — R072 Precordial pain: Secondary | ICD-10-CM

## 2018-02-11 DIAGNOSIS — I110 Hypertensive heart disease with heart failure: Secondary | ICD-10-CM | POA: Diagnosis present

## 2018-02-11 DIAGNOSIS — I2129 ST elevation (STEMI) myocardial infarction involving other sites: Secondary | ICD-10-CM

## 2018-02-11 DIAGNOSIS — I214 Non-ST elevation (NSTEMI) myocardial infarction: Secondary | ICD-10-CM | POA: Diagnosis present

## 2018-02-11 DIAGNOSIS — Z7984 Long term (current) use of oral hypoglycemic drugs: Secondary | ICD-10-CM

## 2018-02-11 DIAGNOSIS — Z8249 Family history of ischemic heart disease and other diseases of the circulatory system: Secondary | ICD-10-CM

## 2018-02-11 DIAGNOSIS — I5022 Chronic systolic (congestive) heart failure: Secondary | ICD-10-CM | POA: Diagnosis not present

## 2018-02-11 DIAGNOSIS — R791 Abnormal coagulation profile: Secondary | ICD-10-CM | POA: Diagnosis present

## 2018-02-11 DIAGNOSIS — M6282 Rhabdomyolysis: Secondary | ICD-10-CM | POA: Diagnosis present

## 2018-02-11 DIAGNOSIS — K219 Gastro-esophageal reflux disease without esophagitis: Secondary | ICD-10-CM | POA: Diagnosis present

## 2018-02-11 DIAGNOSIS — Z87891 Personal history of nicotine dependence: Secondary | ICD-10-CM

## 2018-02-11 DIAGNOSIS — Z955 Presence of coronary angioplasty implant and graft: Secondary | ICD-10-CM

## 2018-02-11 DIAGNOSIS — Z7901 Long term (current) use of anticoagulants: Secondary | ICD-10-CM

## 2018-02-11 DIAGNOSIS — Z95810 Presence of automatic (implantable) cardiac defibrillator: Secondary | ICD-10-CM

## 2018-02-11 DIAGNOSIS — E1151 Type 2 diabetes mellitus with diabetic peripheral angiopathy without gangrene: Secondary | ICD-10-CM | POA: Diagnosis present

## 2018-02-11 DIAGNOSIS — I25110 Atherosclerotic heart disease of native coronary artery with unstable angina pectoris: Secondary | ICD-10-CM | POA: Diagnosis present

## 2018-02-11 DIAGNOSIS — Z823 Family history of stroke: Secondary | ICD-10-CM

## 2018-02-11 DIAGNOSIS — I513 Intracardiac thrombosis, not elsewhere classified: Secondary | ICD-10-CM | POA: Diagnosis present

## 2018-02-11 DIAGNOSIS — E876 Hypokalemia: Secondary | ICD-10-CM | POA: Diagnosis present

## 2018-02-11 DIAGNOSIS — I236 Thrombosis of atrium, auricular appendage, and ventricle as current complications following acute myocardial infarction: Secondary | ICD-10-CM | POA: Diagnosis present

## 2018-02-11 DIAGNOSIS — E118 Type 2 diabetes mellitus with unspecified complications: Secondary | ICD-10-CM | POA: Diagnosis not present

## 2018-02-11 DIAGNOSIS — I252 Old myocardial infarction: Secondary | ICD-10-CM

## 2018-02-11 DIAGNOSIS — I255 Ischemic cardiomyopathy: Secondary | ICD-10-CM | POA: Diagnosis present

## 2018-02-11 DIAGNOSIS — Z8673 Personal history of transient ischemic attack (TIA), and cerebral infarction without residual deficits: Secondary | ICD-10-CM

## 2018-02-11 DIAGNOSIS — E782 Mixed hyperlipidemia: Secondary | ICD-10-CM | POA: Diagnosis present

## 2018-02-11 DIAGNOSIS — M1990 Unspecified osteoarthritis, unspecified site: Secondary | ICD-10-CM | POA: Diagnosis present

## 2018-02-11 DIAGNOSIS — I10 Essential (primary) hypertension: Secondary | ICD-10-CM

## 2018-02-11 DIAGNOSIS — G8929 Other chronic pain: Secondary | ICD-10-CM | POA: Diagnosis present

## 2018-02-11 DIAGNOSIS — Z79899 Other long term (current) drug therapy: Secondary | ICD-10-CM

## 2018-02-11 DIAGNOSIS — I21A1 Myocardial infarction type 2: Principal | ICD-10-CM | POA: Diagnosis present

## 2018-02-11 DIAGNOSIS — G4733 Obstructive sleep apnea (adult) (pediatric): Secondary | ICD-10-CM | POA: Diagnosis present

## 2018-02-11 DIAGNOSIS — E7800 Pure hypercholesterolemia, unspecified: Secondary | ICD-10-CM | POA: Diagnosis present

## 2018-02-11 HISTORY — DX: Heart failure, unspecified: I50.9

## 2018-02-11 HISTORY — DX: Unstable angina: I20.0

## 2018-02-11 HISTORY — DX: Respiratory failure, unspecified, unspecified whether with hypoxia or hypercapnia: J96.90

## 2018-02-11 HISTORY — DX: Chronic pulmonary edema: J81.1

## 2018-02-11 HISTORY — DX: Chest pain, unspecified: R07.9

## 2018-02-11 LAB — BASIC METABOLIC PANEL
ANION GAP: 8 (ref 5–15)
BUN: 12 mg/dL (ref 6–20)
CO2: 28 mmol/L (ref 22–32)
Calcium: 9.3 mg/dL (ref 8.9–10.3)
Chloride: 104 mmol/L (ref 101–111)
Creatinine, Ser: 1.05 mg/dL (ref 0.61–1.24)
GFR calc Af Amer: >60 mL/min (ref >60)
GLUCOSE: 112 mg/dL — AB (ref 65–99)
POTASSIUM: 3.9 mmol/L (ref 3.5–5.1)
SODIUM: 140 mmol/L (ref 135–145)

## 2018-02-11 LAB — DIGOXIN LEVEL: DIGOXIN LVL: 0.3 ng/mL — AB (ref 0.8–2.0)

## 2018-02-11 LAB — PROTIME-INR
INR: 2.73
Prothrombin Time: 28.7 seconds — ABNORMAL HIGH (ref 11.4–15.2)

## 2018-02-11 LAB — I-STAT TROPONIN, ED: Troponin i, poc: 0.07 ng/mL (ref 0.00–0.08)

## 2018-02-11 LAB — CBC
HEMATOCRIT: 42.4 % (ref 39.0–52.0)
HEMOGLOBIN: 13.6 g/dL (ref 13.0–17.0)
MCH: 25.7 pg — ABNORMAL LOW (ref 26.0–34.0)
MCHC: 32.1 g/dL (ref 30.0–36.0)
MCV: 80.2 fL (ref 78.0–100.0)
Platelets: 193 10*3/uL (ref 150–400)
RBC: 5.29 MIL/uL (ref 4.22–5.81)
RDW: 16.2 % — ABNORMAL HIGH (ref 11.5–15.5)
WBC: 8.7 10*3/uL (ref 4.0–10.5)

## 2018-02-11 LAB — TROPONIN I: Troponin I: 1.11 ng/mL (ref <0.03)

## 2018-02-11 MED ORDER — SPIRONOLACTONE 25 MG PO TABS
25.0000 mg | ORAL_TABLET | Freq: Every evening | ORAL | Status: DC
  Administered 2018-02-11 – 2018-02-16 (×6): 25 mg via ORAL
  Filled 2018-02-11 (×6): qty 1

## 2018-02-11 MED ORDER — DIGOXIN 125 MCG PO TABS
0.1250 mg | ORAL_TABLET | Freq: Every day | ORAL | Status: DC
  Administered 2018-02-12 – 2018-02-17 (×6): 0.125 mg via ORAL
  Filled 2018-02-11 (×6): qty 1

## 2018-02-11 MED ORDER — FUROSEMIDE 40 MG PO TABS: 40.0000 mg | ORAL_TABLET | Freq: Two times a day (BID) | ORAL | Status: DC

## 2018-02-11 MED ORDER — EZETIMIBE 10 MG PO TABS
10.0000 mg | ORAL_TABLET | Freq: Every day | ORAL | Status: DC
  Administered 2018-02-12 – 2018-02-17 (×6): 10 mg via ORAL
  Filled 2018-02-11 (×6): qty 1

## 2018-02-11 MED ORDER — POTASSIUM CHLORIDE CRYS ER 20 MEQ PO TBCR
20.0000 meq | EXTENDED_RELEASE_TABLET | Freq: Every day | ORAL | Status: DC
  Administered 2018-02-12 – 2018-02-17 (×6): 20 meq via ORAL
  Filled 2018-02-11 (×6): qty 1

## 2018-02-11 MED ORDER — NITROGLYCERIN 2 % TD OINT
1.0000 [in_us] | TOPICAL_OINTMENT | Freq: Once | TRANSDERMAL | Status: DC
  Filled 2018-02-11: qty 1

## 2018-02-11 MED ORDER — FUROSEMIDE 40 MG PO TABS
40.0000 mg | ORAL_TABLET | Freq: Every day | ORAL | Status: DC
  Administered 2018-02-11 – 2018-02-16 (×6): 40 mg via ORAL
  Filled 2018-02-11 (×5): qty 1

## 2018-02-11 MED ORDER — GABAPENTIN 100 MG PO CAPS
100.0000 mg | ORAL_CAPSULE | Freq: Two times a day (BID) | ORAL | Status: DC
  Administered 2018-02-11 – 2018-02-17 (×12): 100 mg via ORAL
  Filled 2018-02-11 (×12): qty 1

## 2018-02-11 MED ORDER — ONDANSETRON HCL 4 MG/2ML IJ SOLN: 4.0000 mg | Freq: Four times a day (QID) | INTRAMUSCULAR | Status: DC | PRN

## 2018-02-11 MED ORDER — WARFARIN SODIUM 7.5 MG PO TABS
7.5000 mg | ORAL_TABLET | Freq: Once | ORAL | Status: AC
  Administered 2018-02-11: 7.5 mg via ORAL
  Filled 2018-02-11: qty 1

## 2018-02-11 MED ORDER — TRAMADOL HCL 50 MG PO TABS
50.0000 mg | ORAL_TABLET | Freq: Four times a day (QID) | ORAL | Status: DC | PRN
  Administered 2018-02-11: 50 mg via ORAL
  Filled 2018-02-11: qty 1

## 2018-02-11 MED ORDER — LOSARTAN POTASSIUM 25 MG PO TABS
12.5000 mg | ORAL_TABLET | Freq: Every day | ORAL | Status: DC
  Administered 2018-02-12 – 2018-02-17 (×5): 12.5 mg via ORAL
  Filled 2018-02-11: qty 0.5
  Filled 2018-02-11: qty 1
  Filled 2018-02-11 (×2): qty 0.5
  Filled 2018-02-11 (×4): qty 1

## 2018-02-11 MED ORDER — FUROSEMIDE 80 MG PO TABS
80.0000 mg | ORAL_TABLET | Freq: Every day | ORAL | Status: DC
  Administered 2018-02-12 – 2018-02-17 (×5): 80 mg via ORAL
  Filled 2018-02-11 (×7): qty 1

## 2018-02-11 MED ORDER — CLOPIDOGREL BISULFATE 75 MG PO TABS
75.0000 mg | ORAL_TABLET | Freq: Every day | ORAL | Status: DC
  Administered 2018-02-11 – 2018-02-17 (×2): 75 mg via ORAL
  Filled 2018-02-11 (×6): qty 1

## 2018-02-11 MED ORDER — MORPHINE SULFATE (PF) 4 MG/ML IV SOLN
2.0000 mg | INTRAVENOUS | Status: DC | PRN
  Administered 2018-02-11 – 2018-02-12 (×2): 4 mg via INTRAVENOUS
  Administered 2018-02-12 – 2018-02-13 (×3): 2 mg via INTRAVENOUS
  Administered 2018-02-13: 4 mg via INTRAVENOUS
  Administered 2018-02-13: 2 mg via INTRAVENOUS
  Administered 2018-02-14: 4 mg via INTRAVENOUS
  Filled 2018-02-11 (×8): qty 1

## 2018-02-11 MED ORDER — ACETAMINOPHEN 650 MG RE SUPP: 650.0000 mg | Freq: Four times a day (QID) | RECTAL | Status: DC | PRN

## 2018-02-11 MED ORDER — MORPHINE SULFATE (PF) 4 MG/ML IV SOLN
4.0000 mg | Freq: Once | INTRAVENOUS | Status: AC
  Administered 2018-02-11: 4 mg via INTRAVENOUS
  Filled 2018-02-11: qty 1

## 2018-02-11 MED ORDER — EVOLOCUMAB 140 MG/ML ~~LOC~~ SOAJ
1.0000 "pen " | SUBCUTANEOUS | Status: DC
  Administered 2018-02-13: 1 via SUBCUTANEOUS
  Filled 2018-02-11: qty 1

## 2018-02-11 MED ORDER — ACETAMINOPHEN 325 MG PO TABS: 650.0000 mg | ORAL_TABLET | Freq: Four times a day (QID) | ORAL | Status: DC | PRN

## 2018-02-11 MED ORDER — WARFARIN - PHARMACIST DOSING INPATIENT: Freq: Every day | Status: DC

## 2018-02-11 MED ORDER — NITROGLYCERIN IN D5W 200-5 MCG/ML-% IV SOLN
0.0000 ug/min | INTRAVENOUS | Status: DC
  Administered 2018-02-11: 5 ug/min via INTRAVENOUS
  Filled 2018-02-11: qty 250

## 2018-02-11 MED ORDER — CARVEDILOL 3.125 MG PO TABS
3.1250 mg | ORAL_TABLET | Freq: Two times a day (BID) | ORAL | Status: DC
  Administered 2018-02-11 – 2018-02-17 (×11): 3.125 mg via ORAL
  Filled 2018-02-11 (×11): qty 1

## 2018-02-11 MED ORDER — ONDANSETRON HCL 4 MG PO TABS: 4.0000 mg | ORAL_TABLET | Freq: Four times a day (QID) | ORAL | Status: DC | PRN

## 2018-02-11 MED ORDER — INSULIN ASPART 100 UNIT/ML ~~LOC~~ SOLN
0.0000 [IU] | Freq: Three times a day (TID) | SUBCUTANEOUS | Status: DC
Start: 2018-02-12 — End: 2018-02-17
  Administered 2018-02-13: 1 [IU] via SUBCUTANEOUS

## 2018-02-11 MED ORDER — ENOXAPARIN SODIUM 40 MG/0.4ML ~~LOC~~ SOLN: 40.0000 mg | SUBCUTANEOUS | Status: DC
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
## 2018-02-11 NOTE — Progress Notes (Addendum)
Per pharmacist, heparin should be started when INR is less than 2. Last result was 2.73 at 1913. INR will be checked again at 0100.

## 2018-02-11 NOTE — Progress Notes (Signed)
CRITICAL VALUE ALERT  Critical Value: Troponin 1.11  Date & Time Notied:  02/11/18 2025  Provider Notified: Dr. Myna Hidalgo  Orders Received/Actions taken: New orders given

## 2018-02-11 NOTE — Telephone Encounter (Signed)
VM on CHF clinic triage line from [place] states patient c/o CP and HA. Patient with extensive cardiac history including stents. Attempted multiple times past 30 minutes to return call, line rings once and then goes to a busy tone.  No alternate phone number to call.  Patient only has facility listed on his active phone numbers as well. Will continue to attempt to reach facility in order to advise to send patient to ER.  Renee Pain, RN

## 2018-02-11 NOTE — ED Notes (Signed)
Held his nitro paste due to his bp being low
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
## 2018-02-11 NOTE — ED Triage Notes (Signed)
[facility] and started having chest pain while walking outside this morning.  Pt says went in his room and his defibrillator went off.  Pt c/o chest pain in chest, neck, and jaws.  EMS administered 2 nitro and 4 baby asa without relief.

## 2018-02-11 NOTE — Significant Event (Signed)
Called regarding critical troponin elevation.   [name] has hx of CAD, CHF, HTN, DM, and was admitted earlier in the day with chest pain, normal troponin, and EKG similar to prior. No relief with NTG, treated with full-dose ASA prior to arrival in ED. Pain was reproducible and MSK etiology was suspected.   Second troponin returns elevated to 1.11 and pain persists, though has now improved with morphine.   Appreciate on-call cardiologist discussing case and giving following recommendations:  Hold warfarin and start IV heparin infusion, start nitroglycerin infusion as BP allows, and transfer to [facility]. ASA 324 already given.  Discussed plan with RN.

## 2018-02-12 ENCOUNTER — Observation Stay (HOSPITAL_COMMUNITY): Payer: Medicaid Other

## 2018-02-12 ENCOUNTER — Encounter (HOSPITAL_COMMUNITY): Payer: Self-pay | Admitting: Student

## 2018-02-12 DIAGNOSIS — I10 Essential (primary) hypertension: Secondary | ICD-10-CM | POA: Diagnosis not present

## 2018-02-12 DIAGNOSIS — E1151 Type 2 diabetes mellitus with diabetic peripheral angiopathy without gangrene: Secondary | ICD-10-CM | POA: Diagnosis present

## 2018-02-12 DIAGNOSIS — E7800 Pure hypercholesterolemia, unspecified: Secondary | ICD-10-CM | POA: Diagnosis not present

## 2018-02-12 DIAGNOSIS — E876 Hypokalemia: Secondary | ICD-10-CM | POA: Diagnosis present

## 2018-02-12 DIAGNOSIS — R791 Abnormal coagulation profile: Secondary | ICD-10-CM | POA: Diagnosis present

## 2018-02-12 DIAGNOSIS — Z8249 Family history of ischemic heart disease and other diseases of the circulatory system: Secondary | ICD-10-CM | POA: Diagnosis not present

## 2018-02-12 DIAGNOSIS — K219 Gastro-esophageal reflux disease without esophagitis: Secondary | ICD-10-CM | POA: Diagnosis present

## 2018-02-12 DIAGNOSIS — I252 Old myocardial infarction: Secondary | ICD-10-CM | POA: Diagnosis not present

## 2018-02-12 DIAGNOSIS — I214 Non-ST elevation (NSTEMI) myocardial infarction: Secondary | ICD-10-CM | POA: Diagnosis not present

## 2018-02-12 DIAGNOSIS — Z87891 Personal history of nicotine dependence: Secondary | ICD-10-CM | POA: Diagnosis not present

## 2018-02-12 DIAGNOSIS — I2510 Atherosclerotic heart disease of native coronary artery without angina pectoris: Secondary | ICD-10-CM | POA: Diagnosis not present

## 2018-02-12 DIAGNOSIS — I110 Hypertensive heart disease with heart failure: Secondary | ICD-10-CM | POA: Diagnosis present

## 2018-02-12 DIAGNOSIS — Z823 Family history of stroke: Secondary | ICD-10-CM | POA: Diagnosis not present

## 2018-02-12 DIAGNOSIS — R079 Chest pain, unspecified: Secondary | ICD-10-CM | POA: Diagnosis present

## 2018-02-12 DIAGNOSIS — Z7984 Long term (current) use of oral hypoglycemic drugs: Secondary | ICD-10-CM | POA: Diagnosis not present

## 2018-02-12 DIAGNOSIS — E782 Mixed hyperlipidemia: Secondary | ICD-10-CM | POA: Diagnosis present

## 2018-02-12 DIAGNOSIS — Z7901 Long term (current) use of anticoagulants: Secondary | ICD-10-CM | POA: Diagnosis not present

## 2018-02-12 DIAGNOSIS — G8929 Other chronic pain: Secondary | ICD-10-CM | POA: Diagnosis present

## 2018-02-12 DIAGNOSIS — I513 Intracardiac thrombosis, not elsewhere classified: Secondary | ICD-10-CM | POA: Diagnosis present

## 2018-02-12 DIAGNOSIS — Z955 Presence of coronary angioplasty implant and graft: Secondary | ICD-10-CM | POA: Diagnosis not present

## 2018-02-12 DIAGNOSIS — M6282 Rhabdomyolysis: Secondary | ICD-10-CM | POA: Diagnosis present

## 2018-02-12 DIAGNOSIS — I25119 Atherosclerotic heart disease of native coronary artery with unspecified angina pectoris: Secondary | ICD-10-CM | POA: Diagnosis not present

## 2018-02-12 DIAGNOSIS — I255 Ischemic cardiomyopathy: Secondary | ICD-10-CM | POA: Diagnosis present

## 2018-02-12 DIAGNOSIS — I5022 Chronic systolic (congestive) heart failure: Secondary | ICD-10-CM | POA: Diagnosis present

## 2018-02-12 DIAGNOSIS — M1990 Unspecified osteoarthritis, unspecified site: Secondary | ICD-10-CM | POA: Diagnosis present

## 2018-02-12 DIAGNOSIS — Z95810 Presence of automatic (implantable) cardiac defibrillator: Secondary | ICD-10-CM | POA: Diagnosis not present

## 2018-02-12 DIAGNOSIS — R072 Precordial pain: Secondary | ICD-10-CM | POA: Diagnosis not present

## 2018-02-12 DIAGNOSIS — I21A1 Myocardial infarction type 2: Secondary | ICD-10-CM | POA: Diagnosis present

## 2018-02-12 DIAGNOSIS — I340 Nonrheumatic mitral (valve) insufficiency: Secondary | ICD-10-CM

## 2018-02-12 DIAGNOSIS — G4733 Obstructive sleep apnea (adult) (pediatric): Secondary | ICD-10-CM | POA: Diagnosis present

## 2018-02-12 DIAGNOSIS — I25110 Atherosclerotic heart disease of native coronary artery with unstable angina pectoris: Secondary | ICD-10-CM | POA: Diagnosis present

## 2018-02-12 DIAGNOSIS — Z8673 Personal history of transient ischemic attack (TIA), and cerebral infarction without residual deficits: Secondary | ICD-10-CM | POA: Diagnosis not present

## 2018-02-12 DIAGNOSIS — E118 Type 2 diabetes mellitus with unspecified complications: Secondary | ICD-10-CM | POA: Diagnosis not present

## 2018-02-12 LAB — GLUCOSE, CAPILLARY
GLUCOSE-CAPILLARY: 143 mg/dL — AB (ref 65–99)
GLUCOSE-CAPILLARY: 99 mg/dL (ref 65–99)
GLUCOSE-CAPILLARY: 135 mg/dL — AB (ref 65–99)
GLUCOSE-CAPILLARY: 74 mg/dL (ref 65–99)
GLUCOSE-CAPILLARY: 128 mg/dL — AB (ref 65–99)

## 2018-02-12 LAB — ECHOCARDIOGRAM COMPLETE
HEIGHTINCHES: 66 in
WEIGHTICAEL: 2772.5 [oz_av]

## 2018-02-12 LAB — BASIC METABOLIC PANEL
ANION GAP: 8 (ref 5–15)
BUN: 9 mg/dL (ref 6–20)
CHLORIDE: 102 mmol/L (ref 101–111)
CO2: 31 mmol/L (ref 22–32)
Calcium: 9.1 mg/dL (ref 8.9–10.3)
Creatinine, Ser: 1.03 mg/dL (ref 0.61–1.24)
GFR calc Af Amer: >60 mL/min (ref >60)
GFR calc non Af Amer: >60 mL/min (ref >60)
GLUCOSE: 85 mg/dL (ref 65–99)
POTASSIUM: 4.1 mmol/L (ref 3.5–5.1)
Sodium: 141 mmol/L (ref 135–145)

## 2018-02-12 LAB — TROPONIN I
TROPONIN I: 1.46 ng/mL — AB (ref <0.03)
Troponin I: 1.67 ng/mL (ref <0.03)

## 2018-02-12 LAB — PROTIME-INR
INR: 2.74
PROTHROMBIN TIME: 28.8 s — AB (ref 11.4–15.2)

## 2018-02-12 LAB — MAGNESIUM
Magnesium: 2.2 mg/dL (ref 1.7–2.4)
Magnesium: 1.9 mg/dL (ref 1.7–2.4)

## 2018-02-12 LAB — HEMOGLOBIN A1C
Hgb A1c MFr Bld: 6.7 % — ABNORMAL HIGH (ref 4.8–5.6)
Mean Plasma Glucose: 145.59 mg/dL

## 2018-02-12 LAB — MRSA PCR SCREENING: MRSA by PCR: NEGATIVE

## 2018-02-12 MED ORDER — ALPRAZOLAM 0.5 MG PO TABS
0.5000 mg | ORAL_TABLET | Freq: Every evening | ORAL | Status: DC | PRN
  Administered 2018-02-15 – 2018-02-16 (×2): 0.5 mg via ORAL
  Filled 2018-02-12 (×2): qty 1
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
## 2018-02-12 NOTE — Progress Notes (Signed)
ANTICOAGULATION CONSULT NOTE - follow up Tracy for Coumadin-->Heparin Indication: LV Thrombus  Allergies  Allergen Reactions  . Pork-Derived Products Other (See Comments)    Does not eat for religious reasons - okay with using IV heparin    Patient Measurements: Height: 5\' 6"  (167.6 cm) Weight: 173 lb 4.5 oz (78.6 kg) IBW/kg (Calculated) : 63.8  Vital Signs: Temp: 97.5 F (36.4 C) (04/17 0400) Temp Source: Oral (04/17 0400) BP: 96/78 (04/17 0630) Pulse Rate: 56 (04/17 0630)  Labs: Recent Labs    02/11/18 1538 02/11/18 1913 02/12/18 0038 02/12/18 0650  HGB 13.6  --   --   --   HCT 42.4  --   --   --   PLT 193  --   --   --   LABPROT  --  28.7* 28.8*  --   INR  --  2.73 2.74  --   CREATININE 1.05  --   --  1.03  TROPONINI  --  1.11* 1.67* 1.46*    Estimated Creatinine Clearance: 74.2 mL/min (by C-G formula based on SCr of 1.03 mg/dL).   Medical History: Past Medical History:  Diagnosis Date  . AICD (automatic cardioverter/defibrillator) present   . Anxiety   . Arthritis    "all over" (09/17/2017)  . Burn   . CAD (coronary artery disease)    a.. NSTEMI s/p BMS to 1st Diagonal and distal OM2 in 2007; b. STEMI 03/26/12 s/p BMS to RCA; c. NSTEMI 10/2012 cath  chronic occlusion of LCx (unable to open with PCI) and PL branch, mod dz of LAD and diagonal, and preserved LV systolic fxn, Med Rx;  d. 06/2014 Myoview:  EF 30%, large area of reversibility in inflat wall.  . Cardiomyopathy EF 35% on cath 06/30/14, new from jan 2015 07/20/2014  . Chest pain   . CHF (congestive heart failure) ([place])   . Chronic back pain    "all over" (09/17/2017)  . DDD (degenerative disc disease), cervical   . Depression   . GERD (gastroesophageal reflux disease)   . Headache    "a few/wk" (09/17/2017)  . HTN (hypertension)   . Hypercholesterolemia   . Mental disorder   . Myocardial infarction ([place])    "I've had 7" (09/17/2017)  . Pulmonary edema   . Respiratory  failure ([place])   . Sciatic pain   . Sleep apnea   . Stroke [facility])    a. multiple dating back to 2002; *I''ve had 5; LUE/LLE weaker since" (09/17/2017)  . Tibia fracture (l) leg  . Tobacco abuse   . Type 2 diabetes mellitus ([place])   . Unstable angina ([facility])     Medications:  Medications Prior to Admission  Medication Sig Dispense Refill Last Dose  . acetaminophen (TYLENOL) 325 MG tablet Take 2 tablets (650 mg total) by mouth every 4 (four) hours as needed for headache or mild pain.   unknown  . carvedilol (COREG) 3.125 MG tablet TAKE (1) TABLET BY MOUTH TWICE A DAY WITH MEALS. 60 tablet 11 02/11/2018 at 800a  . digoxin (LANOXIN) 0.125 MG tablet Take 1 tablet (0.125 mg total) by mouth daily. 30 tablet 3 02/11/2018 at Unknown time  . Evolocumab (REPATHA SURECLICK) 016 MG/ML SOAJ Inject 1 pen into the skin every 14 (fourteen) days. 2 pen 11 01/30/2018 at Unknown time  . ezetimibe (ZETIA) 10 MG tablet Take 1 tablet (10 mg total) by mouth daily. 30 tablet 3 02/11/2018 at Unknown time  . furosemide (LASIX)  40 MG tablet Take 80 mg (2 tabs) in am and 40 mg (1 tab) in pm (Patient taking differently: Take 40-80 mg by mouth 2 (two) times daily. Take 80 mg (2 tabs) in am and 40 mg (1 tab) in pm. *May take one tablet (in addition) as needed for weight gain greater than 3 pounds) 90 tablet 6 02/11/2018 at Unknown time  . gabapentin (NEURONTIN) 100 MG capsule Take 1 capsule (100 mg total) by mouth 2 (two) times daily. 60 capsule 0 02/11/2018 at Unknown time  . glipiZIDE (GLUCOTROL XL) 10 MG 24 hr tablet Take 1 tablet (10 mg total) by mouth daily. 30 tablet 1 02/11/2018 at Unknown time  . losartan (COZAAR) 25 MG tablet Take 0.5 tablets (12.5 mg total) by mouth daily. 15 tablet 3 02/11/2018 at Unknown time  . metolazone (ZAROXOLYN) 2.5 MG tablet Take 1 tablet (2.5 mg total) by mouth daily as needed. For weight and fluid gain. Only if weight dose not come down with an extra Lasix. See discharge orders. 30 tablet 11 unknown   . nitroGLYCERIN (NITROSTAT) 0.4 MG SL tablet Place 1 tablet (0.4 mg total) under the tongue every 5 (five) minutes as needed for chest pain. 25 tablet 3 unknown  . potassium chloride SA (K-DUR,KLOR-CON) 20 MEQ tablet Take 1 tablet (20 mEq total) by mouth daily. 30 tablet 3 02/11/2018 at Unknown time  . spironolactone (ALDACTONE) 25 MG tablet Take 1 tablet (25 mg total) by mouth every evening. 30 tablet 6 02/10/2018 at Unknown time  . traMADol (ULTRAM) 50 MG tablet Take 50 mg by mouth every 6 (six) hours as needed for moderate pain or severe pain.   01/28/2018 at Unknown time  . warfarin (COUMADIN) 5 MG tablet TAKE 1&1/2 TABLETS (7.5MG ) BY MOUTH DAILY.TAKE AS DIRECTED BY COUMADIN CLINIC. 45 tablet 11 02/10/2018 at 1700    Assessment: 62 yo presente to ED with chest pain. He is on chronic coumadin therapy for LV thrombus.  Pharmacy asked to transition Coumadin to Heparin. INR remains therapeutic at 2.74. Last dose of Coumadin 4/16. Home dose is 7.5mg  daily  Goal of Therapy:  Heparin level 0.3-0.7 units/ml INR 2-3 Monitor platelets by anticoagulation protocol: Yes   Plan:  Start Heparin when INR <2 PT-INR daily Monitor for S/S of bleeding  Isac Sarna, BS [place]ena [place], BCPS Clinical Pharmacist Pager [phone] 02/12/2018,8:01 AM

## 2018-02-12 NOTE — Progress Notes (Signed)
Night shift ICU/SDU coverage note.  The patient's troponin was up from 1.11 at 1913 yesterday to 1.67 ng/mL.  Heparin nitroglycerin were ordered.  He is currently getting nitroglycerin, but since his INR is therapeutic heparin infusion has not had to be started.  He has been set up, but still waiting for transfer to [facility] for cardiology evaluation.  Tennis Must, MD.

## 2018-02-12 NOTE — Progress Notes (Signed)
*  PRELIMINARY RESULTS* Echocardiogram 2D Echocardiogram has been performed.  Daniel Li 02/12/2018, 4:05 PM

## 2018-02-12 NOTE — Progress Notes (Signed)
[facility] PROGRESS NOTE  Damontae Loppnow Brodbeck WER:[id number] DOB: [birth year] DOA: 02/11/2018 PCP: [name], NP  Interim summary and HPI 62 y.o. male with medical history of extensiveCAD(multiple NSTEMI 2007 w/ BMS, STEMI May 2013 and 01/2017 STEMI s/p DES), sCHF(EF 20%) with AICD,apical thrombus on coumadin,DJD, CVA, DM2, HLD, OSA presented with chest pain that began around 1 PM on 02/11/2018 when he was standing outside "enjoying the fresh air".  The patient went inside to his bedroom and sat on his bed when he continued to have chest pressure radiating to his neck with associated dizziness and shortness of breath.  As result, EMS was activated.  The patient had been in his usual state of health without any recent travels or new medications.  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   7-prior hx of stroke -non focal deficit appreciated -continue coumadin   8-chronic systolic HF: in the setting of ischemic cardiomyopathy  -S/P St. Jude AICD -compensated currently  -last EF 20-25% -continue carvedilol, digoxin, losartan and spironolactone  -follow daily weights  and strict intake and output -continue furosemide (home dose) -cardiology on board and plan is to involved heart failure service once at [facility]   Code Status: Full Family Communication: no family at bedside  Disposition Plan: patient case discussed with cardiology, given high risk for CAD and concerns for active NSTEMI will transfer to [facility] for heart cath. For now continue NTG drip and PRN morphine, will also continue plavix, B-blocker and cozaar. Heparin drip to be initiated once INR 2 or less than 2.   Consultants:  Cardiology   Procedures:  See below for x-ray reports   Antibiotics:  None   HPI/Subjective: Afebrile, still having mild CP and denying SOB. Also reported no nausea, vomiting or diaphoresis. Patient CP improved after NTG drip started and morphine given. EKG w/o acute ischemic changes. Has continued Troponin rising   Objective: Vitals:   02/12/18 0630 02/12/18 0820  BP: 96/78 109/84  Pulse: (!) 56 64  Resp: (!) 8   Temp:    SpO2: 98%     Intake/Output Summary (Last 24 hours) at 02/12/2018 0918 Last data filed at 02/12/2018 0500 Gross per 24 hour  Intake 9.13 ml  Output 1050 ml  Net -1040.87 ml   Filed Weights   02/11/18 1841 02/11/18 2151 02/12/18 0500  Weight: 79.8 kg (176 lb) 78.6 kg (173 lb 4.5 oz) 78.6 kg (173 lb 4.5 oz)    Exam:   General:  Afebrile, having mild chest discomfort (even improved while on NTG drip); no nausea, no vomiting and currently w/o SOB.  Cardiovascular: S1 and S2, no rubs, no gallops, no murmur, no JVD appreciated.  Respiratory: good air movement  bilaterally, no wheezing, no crackles appreciate don exam.   Abdomen: soft, no distended, no tender to palpation, positive BS, no guarding.  Musculoskeletal: no edema, no cyanosis, no clubbing   Neuro: CN intact, no focal deficit appreciated, AAOX3, MS 4/5 bilaterally and symmetrically in the setting of poor effort.   Skin: no induration, open wounds, rash or petechiae  appreciated on exam.   Data Reviewed: Basic Metabolic Panel: Recent Labs  Lab 02/11/18 1538 02/12/18 0038 02/12/18 0650  NA 140  --  141  K 3.9  --  4.1  CL 104  --  102  CO2 28  --  31  GLUCOSE 112*  --  85  BUN 12  --  9  CREATININE 1.05  --  1.03  CALCIUM 9.3  --  9.1  MG  --  2.2 1.9   CBC: Recent Labs  Lab 02/11/18 1538  WBC 8.7  HGB 13.6  HCT 42.4  MCV 80.2  PLT 193   Cardiac Enzymes: Recent Labs  Lab 02/11/18 1913 02/12/18 0038 02/12/18 0650  TROPONINI 1.11* 1.67* 1.46*   BNP (last 3 results) Recent Labs    02/21/17 1302 06/07/17 0425  BNP 1,119.4* 956.1*    CBG: Recent Labs  Lab 02/11/18 2146 02/12/18 0801  GLUCAP 143* 99    Recent Results (from the past 240 hour(s))  MRSA PCR Screening     Status: None   Collection Time: 02/11/18  9:42 PM  Result Value Ref Range Status   MRSA by PCR NEGATIVE NEGATIVE Final    Comment:        The GeneXpert MRSA Assay (FDA approved for NASAL specimens only), is one component of a comprehensive MRSA colonization surveillance program. It is not intended to diagnose MRSA infection nor to guide or monitor treatment for MRSA infections. Performed at [facility], [address], [place], [place] [address]      Studies: Dg Chest 2 View  Result Date: 02/11/2018 CLINICAL DATA:  Chest pain EXAM: CHEST - 2 VIEW COMPARISON:  09/18/2017 FINDINGS: Cardiac shadow is stable. Pacing device is again seen. The lungs are clear bilaterally. No acute bony abnormality is noted. IMPRESSION: No acute abnormality noted. Electronically Signed   By: Inez Catalina M.D.   On: 02/11/2018 16:58    Scheduled Meds: . carvedilol  3.125 mg Oral BID WC  . clopidogrel  75 mg Oral Daily  . digoxin  0.125 mg Oral Daily  . [START ON 02/13/2018] Evolocumab  1 pen Subcutaneous Q14 Days  . ezetimibe  10 mg Oral Daily  . furosemide  80 mg Oral QAC breakfast   And  . furosemide  40 mg Oral q1800  . gabapentin  100 mg Oral BID  . insulin  aspart  0-9 Units Subcutaneous TID WC  . losartan  12.5 mg Oral Daily  . potassium chloride SA  20 mEq Oral Daily  . spironolactone  25 mg Oral QPM   Continuous Infusions: . nitroGLYCERIN 5 mcg/min (02/11/18 2155)    Time spent: 40 minutes   Barton Dubois  [facility] Pager [phone]. If 7PM-7AM, please contact night-coverage at [email or web address], password [facility] 02/12/2018, 9:18 AM  LOS: 0 days

## 2018-02-12 NOTE — Progress Notes (Signed)
CRITICAL VALUE ALERT  Critical Value:  Troponin 1.67  Date & Time Notied:  02/12/18  0210  Provider Notified: Olevia Bowens  Orders Received/Actions taken: New orders added

## 2018-02-13 LAB — CBC
HEMATOCRIT: 43.3 % (ref 39.0–52.0)
Hemoglobin: 13.9 g/dL (ref 13.0–17.0)
MCH: 25.6 pg — ABNORMAL LOW (ref 26.0–34.0)
MCHC: 32.1 g/dL (ref 30.0–36.0)
MCV: 79.9 fL (ref 78.0–100.0)
Platelets: 183 10*3/uL (ref 150–400)
RBC: 5.42 MIL/uL (ref 4.22–5.81)
RDW: 16.1 % — ABNORMAL HIGH (ref 11.5–15.5)
WBC: 6.9 10*3/uL (ref 4.0–10.5)

## 2018-02-13 LAB — CUP PACEART INCLINIC DEVICE CHECK
Date Time Interrogation Session: 20190418132820
Implantable Lead Location: 753860
MDC IDC LEAD IMPLANT DT: 20181120
MDC IDC PG IMPLANT DT: 20181120
Pulse Gen Serial Number: 9786940

## 2018-02-13 LAB — GLUCOSE, CAPILLARY
GLUCOSE-CAPILLARY: 121 mg/dL — AB (ref 65–99)
GLUCOSE-CAPILLARY: 101 mg/dL — AB (ref 65–99)
Glucose-Capillary: 110 mg/dL — ABNORMAL HIGH (ref 65–99)

## 2018-02-13 LAB — BASIC METABOLIC PANEL
Anion gap: 11 (ref 5–15)
BUN: 13 mg/dL (ref 6–20)
CO2: 29 mmol/L (ref 22–32)
Calcium: 9.7 mg/dL (ref 8.9–10.3)
Chloride: 98 mmol/L — ABNORMAL LOW (ref 101–111)
Creatinine, Ser: 1.04 mg/dL (ref 0.61–1.24)
GFR calc non Af Amer: >60 mL/min (ref >60)
Glucose, Bld: 218 mg/dL — ABNORMAL HIGH (ref 65–99)
POTASSIUM: 3.6 mmol/L (ref 3.5–5.1)
SODIUM: 138 mmol/L (ref 135–145)

## 2018-02-13 LAB — PROTIME-INR
INR: 2.2
Prothrombin Time: 24.3 seconds — ABNORMAL HIGH (ref 11.4–15.2)

## 2018-02-13 MED ORDER — PHYTONADIONE 5 MG PO TABS
2.5000 mg | ORAL_TABLET | Freq: Once | ORAL | Status: AC
Start: 2018-02-13 — End: 2018-02-13
  Administered 2018-02-13: 2.5 mg via ORAL
  Filled 2018-02-13: qty 1

## 2018-02-13 NOTE — Progress Notes (Signed)
Patient was advised multiple times about the risk of getting out of bed without asking for assistance in light of his medication infusion(s). Patient was advised of the risks of the medications that are in use. Patient refuses to be compliant with the fall risk/risk mitigation offered and continues to get out of bed without asking for assistance.

## 2018-02-13 NOTE — Progress Notes (Signed)
Progress Note  Patient Name: Daniel Li Date of Encounter: 02/13/2018  Primary Cardiologist: Carlyle Dolly, MD   Subjective   Intermittent chest pain this AM. Currntly sitting comfortably.   Inpatient Medications    Scheduled Meds: . carvedilol  3.125 mg Oral BID WC  . clopidogrel  75 mg Oral Daily  . digoxin  0.125 mg Oral Daily  . Evolocumab  1 pen Subcutaneous Q14 Days  . ezetimibe  10 mg Oral Daily  . furosemide  80 mg Oral QAC breakfast   And  . furosemide  40 mg Oral q1800  . gabapentin  100 mg Oral BID  . insulin aspart  0-9 Units Subcutaneous TID WC  . losartan  12.5 mg Oral Daily  . potassium chloride SA  20 mEq Oral Daily  . spironolactone  25 mg Oral QPM   Continuous Infusions: . nitroGLYCERIN 5 mcg/min (02/13/18 0500)   PRN Meds: acetaminophen **OR** acetaminophen, ALPRAZolam, morphine, ondansetron **OR** ondansetron (ZOFRAN) IV   Vital Signs    Vitals:   02/13/18 0500 02/13/18 0600 02/13/18 0700 02/13/18 0800  BP:  102/71 92/68 92/73   Pulse: 68   (!) 57  Resp: 17 18 12 12   Temp:    98.4 F (36.9 C)  TempSrc:    Oral  SpO2: 94%   93%  Weight: 174 lb 13.2 oz (79.3 kg)     Height:        Intake/Output Summary (Last 24 hours) at 02/13/2018 0816 Last data filed at 02/13/2018 0500 Gross per 24 hour  Intake 37.5 ml  Output 1550 ml  Net -1512.5 ml   Filed Weights   02/11/18 2151 02/12/18 0500 02/13/18 0500  Weight: 173 lb 4.5 oz (78.6 kg) 173 lb 4.5 oz (78.6 kg) 174 lb 13.2 oz (79.3 kg)    Telemetry    SR - Personally Reviewed  ECG    na  Physical Exam  GEN: No acute distress.   Neck: No JVD Cardiac: RRR, no murmurs, rubs, or gallops.  Respiratory: Clear to auscultation bilaterally. GI: Soft, nontender, non-distended  MS: No edema; No deformity. Neuro:  Nonfocal  Psych: Normal affect   Labs    Chemistry Recent Labs  Lab 02/11/18 1538 02/12/18 0650  NA 140 141  K 3.9 4.1  CL 104 102  CO2 28 31  GLUCOSE 112* 85    BUN 12 9  CREATININE 1.05 1.03  CALCIUM 9.3 9.1  GFRNONAA >60 >60  GFRAA >60 >60  ANIONGAP 8 8     Hematology Recent Labs  Lab 02/11/18 1538  WBC 8.7  RBC 5.29  HGB 13.6  HCT 42.4  MCV 80.2  MCH 25.7*  MCHC 32.1  RDW 16.2*  PLT 193    Cardiac Enzymes Recent Labs  Lab 02/11/18 1913 02/12/18 0038 02/12/18 0650  TROPONINI 1.11* 1.67* 1.46*    Recent Labs  Lab 02/11/18 1541  TROPIPOC 0.07     BNPNo results for input(s): BNP, PROBNP in the last 168 hours.   DDimer No results for input(s): DDIMER in the last 168 hours.   Radiology    Dg Chest 2 View  Result Date: 02/11/2018 CLINICAL DATA:  Chest pain EXAM: CHEST - 2 VIEW COMPARISON:  09/18/2017 FINDINGS: Cardiac shadow is stable. Pacing device is again seen. The lungs are clear bilaterally. No acute bony abnormality is noted. IMPRESSION: No acute abnormality noted. Electronically Signed   By: Inez Catalina M.D.   On: 02/11/2018 16:58    Cardiac  Studies    Patient Profile     Daniel Li is a 62 y.o. male with past medical history of CAD (s/p BMS to D1 and OM2 in 2007, BMS to RCA in 02/2012, CTO of LCx by cath in 2014, and most recently anterior STEMI in 01/2017 with DES to Proximal-LAD), chronic systolic CHF (EF 03-54% by echo in 05/2017), ischemic cardiomyopathy (s/p St. 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 Carles Collet.     Assessment & Plan    1. NSTEMI/CAD - Admitted with chest pain, trop up to 1.67 and trending down consistent with NSTEMI.  - Current medical therapy with coreg 3.125mg  bid, plavix 75, zetia, evolucamab(statin intolerant), losarta 12.5, NG gtt. No hep yet since INR 2.7 yesterday, pending today   2. Chronic systolic HF - echo yesterday with stable LVEF 20-25% - appears euvolemic - continue current  meds   Awaiting transfer to [facility]. Pending INR today, may give dose of oral vit K.   For questions or updates, please contact [facility] Please consult [email or web address] for contact info under Cardiology/STEMI.      Merrily Pew, MD  02/13/2018, 8:16 AM

## 2018-02-13 NOTE — Progress Notes (Signed)
Evolocumab pen not available. Pt asked if someone is able to bring it from home, he said yes and that his friend could bring it to him after lunch. Explained to pt that once his friend brings the pen I would still need to send it to Pharmacy to have it labeled before I could administer it to him.

## 2018-02-13 NOTE — Progress Notes (Signed)
[facility] PROGRESS NOTE  Daniel Li PYP:[id number] DOB: 1955/11/20 DOA: 02/11/2018 PCP: [name], NP  Interim summary and HPI 62 y.o. male with medical history of extensiveCAD(multiple NSTEMI 2007 w/ BMS, STEMI May 2013 and 01/2017 STEMI s/p DES), sCHF(EF 20%) with AICD,apical thrombus on coumadin,DJD, CVA, DM2, HLD, OSA presented with chest pain that began around 1 PM on 02/11/2018 when he was standing outside "enjoying the fresh air".  The patient went inside to his bedroom and sat on his bed when he continued to have chest pressure radiating to his neck with associated dizziness and shortness of breath.  As result, EMS was activated.  The patient had been in his usual state of health without any recent travels or new medications.  Assessment/Plan: 1-NSTEMI -patient with ischemic cardiomyopathy and known CAD -troponin trended up to 1.67 and trending down now -continue b-blocker, Cozaar and NTG drip -will continue plavix -no heparin drip until INR 2 or < 2; INR currently 2.2 -still having mild intermittent CP discomfort  -cardiology on board; plan is to transfer him to [facility] for heart Cath.  There is still no beds available and the plan is to transfer the patient straight to the Cath Lab tomorrow 4/19 if his INR is 1.8 or less.  2-HLD -intolerant to statins  -continue Repatha (not available in the hospital) and Zetia -Patient to bring medication from home in order to continue treatment.  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   7-prior hx  of stroke -non focal deficit appreciated -continue coumadin   8-chronic systolic HF: in the setting of ischemic cardiomyopathy  -S/P St. Jude AICD -compensated currently  -last EF 20-25% -continue carvedilol, digoxin, losartan and spironolactone  -follow daily weights and strict intake and output -continue furosemide (home dose) -cardiology on board and plan is to involved heart failure service once at [facility]   Code Status: Full Family Communication: no family at bedside  Disposition Plan: patient case discussed with cardiology, given high risk for CAD and concerns for active NSTEMI will transfer to [facility] for heart cath. For now continue NTG drip and PRN morphine, will also continue plavix, B-blocker and cozaar. Heparin drip to be initiated once INR 2 or less than 2.   Consultants:  Cardiology   Procedures:  See below for x-ray reports   Antibiotics:  None   HPI/Subjective: No fever, no shortness of breath, no nausea, no vomiting.  Patient is still with mild intermittent chest discomfort (even this is will improve while on nitroglycerin drip and using as needed morphine).  INR down to 2.2  Objective: Vitals:   02/13/18 1611 02/13/18 1701  BP:  111/82  Pulse: 61 75  Resp: 14   Temp: 98.3 F (36.8 C)   SpO2: 96%     Intake/Output Summary (Last 24 hours) at 02/13/2018 1729 Last data filed at 02/13/2018 1500 Gross per 24 hour  Intake 40.5 ml  Output 1775 ml  Net -1734.5 ml   Filed Weights   02/11/18 2151 02/12/18 0500 02/13/18 0500  Weight: 78.6 kg (173 lb 4.5 oz) 78.6 kg (173  lb 4.5 oz) 79.3 kg (174 lb 13.2 oz)    Exam:   General: Fever, still having mild chest discomfort intermittently (control with nitroglycerin drip and as needed morphine pushes).  Patient reports no shortness of breath currently, no nausea, no vomiting, no abdominal pain.    Cardiovascular: S1 and S2, no rubs, no gallops, no murmurs, no JVD.   Respiratory: Good air movement bilaterally, no  wheezing, no crackles, no using accessory muscles.  Abdomen: Soft, nondistended, no tenderness to palpation, positive bowel sounds, no guarding.  Musculoskeletal: No edema, no cyanosis, no clubbing.  Neuro: Cranial nerve grossly intact, patient is alert, awake and oriented x3; no focal deficit appreciated.  Skin: No induration, no open wounds, no rash or petechiae on exam.  Data Reviewed: Basic Metabolic Panel: Recent Labs  Lab 02/11/18 1538 02/12/18 0038 02/12/18 0650 02/13/18 0948  NA 140  --  141 138  K 3.9  --  4.1 3.6  CL 104  --  102 98*  CO2 28  --  31 29  GLUCOSE 112*  --  85 218*  BUN 12  --  9 13  CREATININE 1.05  --  1.03 1.04  CALCIUM 9.3  --  9.1 9.7  MG  --  2.2 1.9  --    CBC: Recent Labs  Lab 02/11/18 1538 02/13/18 0948  WBC 8.7 6.9  HGB 13.6 13.9  HCT 42.4 43.3  MCV 80.2 79.9  PLT 193 183   Cardiac Enzymes: Recent Labs  Lab 02/11/18 1913 02/12/18 0038 02/12/18 0650  TROPONINI 1.11* 1.67* 1.46*   BNP (last 3 results) Recent Labs    02/21/17 1302 06/07/17 0425  BNP 1,119.4* 956.1*    CBG: Recent Labs  Lab 02/12/18 1628 02/12/18 2105 02/13/18 0758 02/13/18 1124 02/13/18 1613  GLUCAP 74 128* 110* 121* 101*    Recent Results (from the past 240 hour(s))  MRSA PCR Screening     Status: None   Collection Time: 02/11/18  9:42 PM  Result Value Ref Range Status   MRSA by PCR NEGATIVE NEGATIVE Final    Comment:        The GeneXpert MRSA Assay (FDA approved for NASAL specimens only), is one component of a comprehensive MRSA colonization surveillance program. It is not intended to diagnose MRSA infection nor to guide or monitor treatment for MRSA infections. Performed at [facility], [address] [place], [place] [address]      Studies: No results found.  Scheduled Meds: . carvedilol  3.125 mg Oral BID WC  . clopidogrel  75 mg Oral Daily  . digoxin  0.125 mg Oral Daily  . Evolocumab  1 pen Subcutaneous Q14 Days  .  ezetimibe  10 mg Oral Daily  . furosemide  80 mg Oral QAC breakfast   And  . furosemide  40 mg Oral q1800  . gabapentin  100 mg Oral BID  . insulin aspart  0-9 Units Subcutaneous TID WC  . losartan  12.5 mg Oral Daily  . potassium chloride SA  20 mEq Oral Daily  . spironolactone  25 mg Oral QPM   Continuous Infusions: . nitroGLYCERIN 10 mcg/min (02/13/18 1000)    Time spent: 30 minutes   [facility] Pager [phone]. If 7PM-7AM, please contact night-coverage at [email or web address], password [facility] 02/13/2018, 5:29 PM  LOS: 1 day

## 2018-02-14 ENCOUNTER — Ambulatory Visit (HOSPITAL_COMMUNITY): Admission: RE | Admit: 2018-02-14 | Payer: Medicaid Other | Source: Ambulatory Visit

## 2018-02-14 ENCOUNTER — Inpatient Hospital Stay (HOSPITAL_COMMUNITY)
Admission: EM | Disposition: A | Discharge status: Home / Self Care | Payer: Self-pay | Source: Home / Self Care | Attending: Cardiovascular Disease

## 2018-02-14 DIAGNOSIS — I2510 Atherosclerotic heart disease of native coronary artery without angina pectoris: Secondary | ICD-10-CM

## 2018-02-14 HISTORY — PX: CORONARY ANGIOGRAPHY: CATH118303

## 2018-02-14 LAB — BASIC METABOLIC PANEL
Anion gap: 10 (ref 5–15)
BUN: 17 mg/dL (ref 6–20)
CHLORIDE: 98 mmol/L — AB (ref 101–111)
CO2: 31 mmol/L (ref 22–32)
CREATININE: 1.13 mg/dL (ref 0.61–1.24)
Calcium: 9.7 mg/dL (ref 8.9–10.3)
GFR calc non Af Amer: >60 mL/min (ref >60)
Glucose, Bld: 114 mg/dL — ABNORMAL HIGH (ref 65–99)
Potassium: 4.2 mmol/L (ref 3.5–5.1)
Sodium: 139 mmol/L (ref 135–145)

## 2018-02-14 LAB — GLUCOSE, CAPILLARY
GLUCOSE-CAPILLARY: 107 mg/dL — AB (ref 65–99)
GLUCOSE-CAPILLARY: 78 mg/dL (ref 65–99)
Glucose-Capillary: 105 mg/dL — ABNORMAL HIGH (ref 65–99)
Glucose-Capillary: 150 mg/dL — ABNORMAL HIGH (ref 65–99)

## 2018-02-14 LAB — PROTIME-INR
INR: 1.71
Prothrombin Time: 19.9 seconds — ABNORMAL HIGH (ref 11.4–15.2)

## 2018-02-14 SURGERY — CORONARY ANGIOGRAPHY (CATH LAB)
Anesthesia: LOCAL

## 2018-02-14 MED ORDER — LIDOCAINE HCL (PF) 1 % IJ SOLN
INTRAMUSCULAR | Status: DC | PRN
  Administered 2018-02-14: 2 mL

## 2018-02-14 MED ORDER — NITROGLYCERIN 1 MG/10 ML FOR IR/CATH LAB
INTRA_ARTERIAL | Status: AC
  Filled 2018-02-14: qty 10

## 2018-02-14 MED ORDER — HEPARIN SODIUM (PORCINE) 1000 UNIT/ML IJ SOLN
INTRAMUSCULAR | Status: AC
  Filled 2018-02-14: qty 1

## 2018-02-14 MED ORDER — SODIUM CHLORIDE 0.9% FLUSH: 3.0000 mL | INTRAVENOUS | Status: DC | PRN

## 2018-02-14 MED ORDER — VERAPAMIL HCL 2.5 MG/ML IV SOLN
INTRA_ARTERIAL | Status: DC | PRN
  Administered 2018-02-14: 5 mL via INTRA_ARTERIAL

## 2018-02-14 MED ORDER — LIDOCAINE HCL (PF) 1 % IJ SOLN
INTRAMUSCULAR | Status: AC
  Filled 2018-02-14: qty 30

## 2018-02-14 MED ORDER — HEPARIN (PORCINE) IN NACL 100-0.45 UNIT/ML-% IJ SOLN
1100.0000 [IU]/h | INTRAMUSCULAR | Status: DC
  Administered 2018-02-14: 1100 [IU]/h via INTRAVENOUS
  Filled 2018-02-14: qty 250

## 2018-02-14 MED ORDER — ONDANSETRON HCL 4 MG/2ML IJ SOLN: 4.0000 mg | Freq: Four times a day (QID) | INTRAMUSCULAR | Status: DC | PRN

## 2018-02-14 MED ORDER — WARFARIN SODIUM 10 MG PO TABS
10.0000 mg | ORAL_TABLET | Freq: Once | ORAL | Status: AC
  Administered 2018-02-14: 10 mg via ORAL
  Filled 2018-02-14: qty 1

## 2018-02-14 MED ORDER — ACETAMINOPHEN 325 MG PO TABS
650.0000 mg | ORAL_TABLET | ORAL | Status: DC | PRN
  Administered 2018-02-15: 650 mg via ORAL
  Filled 2018-02-14: qty 2

## 2018-02-14 MED ORDER — WARFARIN SODIUM 10 MG PO TABS
10.0000 mg | ORAL_TABLET | Freq: Once | ORAL | Status: DC
  Filled 2018-02-14: qty 1

## 2018-02-14 MED ORDER — SODIUM CHLORIDE 0.9 % IV SOLN: INTRAVENOUS | Status: AC

## 2018-02-14 MED ORDER — ASPIRIN 81 MG PO CHEW
81.0000 mg | CHEWABLE_TABLET | ORAL | Status: AC
  Administered 2018-02-14: 81 mg via ORAL
  Filled 2018-02-14: qty 1

## 2018-02-14 MED ORDER — ASPIRIN 81 MG PO CHEW
81.0000 mg | CHEWABLE_TABLET | Freq: Every day | ORAL | Status: DC
  Administered 2018-02-15: 81 mg via ORAL
  Filled 2018-02-14: qty 1

## 2018-02-14 MED ORDER — IOHEXOL 350 MG/ML SOLN
INTRAVENOUS | Status: DC | PRN
  Administered 2018-02-14: 60 mL

## 2018-02-14 MED ORDER — ASPIRIN 81 MG PO CHEW: 81.0000 mg | CHEWABLE_TABLET | ORAL | Status: DC

## 2018-02-14 MED ORDER — MIDAZOLAM HCL 2 MG/2ML IJ SOLN
INTRAMUSCULAR | Status: AC
  Filled 2018-02-14: qty 2

## 2018-02-14 MED ORDER — SODIUM CHLORIDE 0.9 % IV SOLN
INTRAVENOUS | Status: DC
  Administered 2018-02-14: 11:00:00 via INTRAVENOUS

## 2018-02-14 MED ORDER — MORPHINE SULFATE (PF) 2 MG/ML IV SOLN: 2.0000 mg | INTRAVENOUS | Status: DC | PRN

## 2018-02-14 MED ORDER — FENTANYL CITRATE (PF) 100 MCG/2ML IJ SOLN
INTRAMUSCULAR | Status: AC
  Filled 2018-02-14: qty 2

## 2018-02-14 MED ORDER — WARFARIN - PHARMACIST DOSING INPATIENT
Freq: Every day | Status: DC
  Administered 2018-02-14: 19:00:00

## 2018-02-14 MED ORDER — HEPARIN (PORCINE) IN NACL 2-0.9 UNITS/ML
INTRAMUSCULAR | Status: AC | PRN
  Administered 2018-02-14 (×2): 500 mL

## 2018-02-14 MED ORDER — HEPARIN SODIUM (PORCINE) 1000 UNIT/ML IJ SOLN
INTRAMUSCULAR | Status: DC | PRN
  Administered 2018-02-14: 4000 [IU] via INTRAVENOUS

## 2018-02-14 MED ORDER — SODIUM CHLORIDE 0.9 % IV SOLN: 250.0000 mL | INTRAVENOUS | Status: DC | PRN

## 2018-02-14 MED ORDER — SODIUM CHLORIDE 0.9% FLUSH
3.0000 mL | Freq: Two times a day (BID) | INTRAVENOUS | Status: DC
  Administered 2018-02-15 – 2018-02-17 (×5): 3 mL via INTRAVENOUS

## 2018-02-14 MED ORDER — HEPARIN (PORCINE) IN NACL 1000-0.9 UT/500ML-% IV SOLN
INTRAVENOUS | Status: AC
  Filled 2018-02-14: qty 500

## 2018-02-14 MED ORDER — VERAPAMIL HCL 2.5 MG/ML IV SOLN
INTRAVENOUS | Status: AC
  Filled 2018-02-14: qty 2

## 2018-02-14 MED ORDER — HEPARIN (PORCINE) IN NACL 100-0.45 UNIT/ML-% IJ SOLN
1150.0000 [IU]/h | INTRAMUSCULAR | Status: DC
  Administered 2018-02-14: 1200 [IU]/h via INTRAVENOUS
  Administered 2018-02-15 – 2018-02-16 (×2): 1150 [IU]/h via INTRAVENOUS
  Filled 2018-02-14 (×4): qty 250

## 2018-02-14 SURGICAL SUPPLY — 10 items
BAND ZEPHYR COMPRESS 30 LONG (HEMOSTASIS) ×2 IMPLANT
CATH OPTITORQUE TIG 4.0 6F (CATHETERS) ×2 IMPLANT
GLIDESHEATH SLEND A-KIT 6F 22G (SHEATH) ×2 IMPLANT
GUIDEWIRE INQWIRE 1.5J.035X260 (WIRE) ×1 IMPLANT
INQWIRE 1.5J .035X260CM (WIRE) ×2
KIT HEART LEFT (KITS) ×2 IMPLANT
PACK CARDIAC CATHETERIZATION (CUSTOM PROCEDURE TRAY) ×2 IMPLANT
TRANSDUCER W/STOPCOCK (MISCELLANEOUS) ×2 IMPLANT
TUBING CIL FLEX 10 FLL-RA (TUBING) ×2 IMPLANT
WIRE HI TORQ VERSACORE-J 145CM (WIRE) ×2 IMPLANT

## 2018-02-14 NOTE — Progress Notes (Signed)
[facility] PROGRESS NOTE  Harrold Fitchett Geis POE:[id number] DOB: 07-04-1956 DOA: 02/11/2018 PCP: [name], NP  Interim summary and HPI 62 y.o. male with medical history of extensiveCAD(multiple NSTEMI 2007 w/ BMS, STEMI May 2013 and 01/2017 STEMI s/p DES), sCHF(EF 20%) with AICD,apical thrombus on coumadin,DJD, CVA, DM2, HLD, OSA presented with chest pain that began around 1 PM on 02/11/2018 when he was standing outside "enjoying the fresh air".  The patient went inside to his bedroom and sat on his bed when he continued to have chest pressure radiating to his neck with associated dizziness and shortness of breath.  As result, EMS was activated.  The patient had been in his usual state of health without any recent travels or new medications.  Assessment/Plan: 1-NSTEMI -patient with ischemic cardiomyopathy and known CAD -troponin trended up to 1.67 and trending down now -continue b-blocker, Cozaar and NTG drip -will continue plavix -still having mild intermittent CP discomfort; but very little and well-controlled with the use of intermittent morphine and nitroglycerin drip. -cardiology on board; plan is to transfer him to [facility]; INR 1.7 today, patient started on heparin drip.  Heart cath later this afternoon.    2-HLD -intolerant to statins  -continue Repatha (not available in the hospital) and Zetia -Patient to bring medication from home in order to continue treatment.  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 -Continue current regimen. -Follow vital signs. -Intermittent mild soft blood pressure while receiving nitroglycerin drip.  5-type 2 diabetes  -continue SSI and continue holding oral hypoglycemic regimen while inpatient  -follow CBG's, if needed will add levemir  6-tobacco abuse in remission -patient quit about 2 months ago -congratulated  and encourage to keep himself smoking free.   7-prior hx of stroke -non focal deficit appreciated -continue coumadin   8-chronic systolic HF: in the setting of ischemic cardiomyopathy  -S/P St. Jude AICD -compensated currently  -last EF 20-25% -continue carvedilol, digoxin, losartan and spironolactone  -follow daily weights and strict intake and output -continue furosemide (home dose) -cardiology on board and plan is to involved heart failure service once at [facility]   Code Status: Full Family Communication: no family at bedside  Disposition Plan: patient case discussed with cardiology, active NSTEMI, given extensive coronary artery disease and ischemic cardiomyopathy, patient will be transferred to [facility] for heart cath. For now continue NTG drip and PRN morphine, will also continue plavix, B-blocker and cozaar.  Heparin drip initiated today as his INR is less than 2.   Consultants:  Cardiology   Procedures:  See below for x-ray reports   Antibiotics:  None   HPI/Subjective: No fever, no shortness of breath, no nausea, no vomiting.  Very little chest discomfort but is well controlled with the use of morphine and nitroglycerin drip.  Patient's INR down to 1.7.  Objective: Vitals:   02/14/18 0700 02/14/18 0800  BP: (!) 89/68 97/71  Pulse: (!) 58 64  Resp: 11 19  Temp:  97.9 F (36.6 C)  SpO2: 96% 97%    Intake/Output Summary (Last 24 hours) at 02/14/2018 0921 Last data filed at 02/14/2018 0500 Gross per 24 hour  Intake 25.5 ml  Output 2650 ml  Net -2624.5 ml   Filed Weights   02/12/18 0500 02/13/18 0500 02/14/18 0500  Weight: 78.6 kg (173 lb 4.5 oz) 79.3 kg (174 lb 13.2 oz) 78.6 kg (173  lb 4.5 oz)    Exam:   General: Afebrile, patient is still having very little intermittent chest discomfort which has been well-controlled with the use of nitroglycerin drip and intermittent morphine.  Denies shortness of breath.  No nausea, no  vomiting.  Cardiovascular: S1 and S2, no rubs, no gallops, no murmurs, no JVD.  AICD device in place  Respiratory: Clear to auscultation bilaterally, no using accessory muscles.  Abdomen: Soft, nondistended, no tenderness to palpation, positive bowel sounds.  Well-healed mid abdominal scar from previous incision.  Musculoskeletal: No edema, no cyanosis, no clubbing.  Neuro: Cranial nerves intact, patient is alert, awake and oriented x3, no focal deficits.  Skin: No induration, no open wounds, no rash or petechiae.   Data Reviewed: Basic Metabolic Panel: Recent Labs  Lab 02/11/18 1538 02/12/18 0038 02/12/18 0650 02/13/18 0948 02/14/18 0502  NA 140  --  141 138 139  K 3.9  --  4.1 3.6 4.2  CL 104  --  102 98* 98*  CO2 28  --  31 29 31   GLUCOSE 112*  --  85 218* 114*  BUN 12  --  9 13 17   CREATININE 1.05  --  1.03 1.04 1.13  CALCIUM 9.3  --  9.1 9.7 9.7  MG  --  2.2 1.9  --   --    CBC: Recent Labs  Lab 02/11/18 1538 02/13/18 0948  WBC 8.7 6.9  HGB 13.6 13.9  HCT 42.4 43.3  MCV 80.2 79.9  PLT 193 183   Cardiac Enzymes: Recent Labs  Lab 02/11/18 1913 02/12/18 0038 02/12/18 0650  TROPONINI 1.11* 1.67* 1.46*   BNP (last 3 results) Recent Labs    02/21/17 1302 06/07/17 0425  BNP 1,119.4* 956.1*    CBG: Recent Labs  Lab 02/12/18 2105 02/13/18 0758 02/13/18 1124 02/13/18 1613 02/14/18 0749  GLUCAP 128* 110* 121* 101* 107*    Recent Results (from the past 240 hour(s))  MRSA PCR Screening     Status: None   Collection Time: 02/11/18  9:42 PM  Result Value Ref Range Status   MRSA by PCR NEGATIVE NEGATIVE Final    Comment:        The GeneXpert MRSA Assay (FDA approved for NASAL specimens only), is one component of a comprehensive MRSA colonization surveillance program. It is not intended to diagnose MRSA infection nor to guide or monitor treatment for MRSA infections. Performed at [facility], [address] [place], [place] [address]       Studies: No results found.  Scheduled Meds: . carvedilol  3.125 mg Oral BID WC  . clopidogrel  75 mg Oral Daily  . digoxin  0.125 mg Oral Daily  . Evolocumab  1 pen Subcutaneous Q14 Days  . ezetimibe  10 mg Oral Daily  . furosemide  80 mg Oral QAC breakfast   And  . furosemide  40 mg Oral q1800  . gabapentin  100 mg Oral BID  . insulin aspart  0-9 Units Subcutaneous TID WC  . losartan  12.5 mg Oral Daily  . potassium chloride SA  20 mEq Oral Daily  . spironolactone  25 mg Oral QPM   Continuous Infusions: . heparin 1,100 Units/hr (02/14/18 0757)  . nitroGLYCERIN 10 mcg/min (02/13/18 1000)    Time spent: 25 minutes   [facility] Pager [phone]. If 7PM-7AM, please contact night-coverage at [email or web address], password [facility] 02/14/2018, 9:21 AM  LOS: 2 days

## 2018-02-14 NOTE — Progress Notes (Addendum)
ANTICOAGULATION CONSULT NOTE - Initial Consult  Pharmacy Consult for warfarin/heparin Indication: LV mural thrombus  Allergies  Allergen Reactions  . Pork-Derived Products Other (See Comments)    Does not eat for religious reasons - okay with using IV heparin    Patient Measurements: Height: 5\' 6"  (167.6 cm) Weight: 173 lb 4.5 oz (78.6 kg) IBW/kg (Calculated) : 63.8  Heparin dosing weight: 78 kg   Vital Signs: Temp: 97.9 F (36.6 C) (04/19 0800) Temp Source: Oral (04/19 0800) BP: 97/61 (04/19 1333) Pulse Rate: 66 (04/19 1333)  Labs: Recent Labs    02/11/18 1538  02/11/18 1913 02/12/18 0038 02/12/18 0650 02/13/18 0948 02/14/18 0502  HGB 13.6  --   --   --   --  13.9  --   HCT 42.4  --   --   --   --  43.3  --   PLT 193  --   --   --   --  183  --   LABPROT  --    < > 28.7* 28.8*  --  24.3* 19.9*  INR  --    < > 2.73 2.74  --  2.20 1.71  CREATININE 1.05  --   --   --  1.03 1.04 1.13  TROPONINI  --   --  1.11* 1.67* 1.46*  --   --    < > = values in this interval not displayed.    Estimated Creatinine Clearance: 67.7 mL/min (by C-G formula based on SCr of 1.13 mg/dL).   Medical History: Past Medical History:  Diagnosis Date  . AICD (automatic cardioverter/defibrillator) present   . Anxiety   . Arthritis    "all over" (09/17/2017)  . Burn   . CAD (coronary artery disease)    a. NSTEMI s/p BMS to 1st Diagonal and distal OM2 in 2007; b. STEMI 03/26/12 s/p BMS to RCA; c. NSTEMI 10/2012 : CTO of LCx (unable to open with PCI) and PL branch, mod dz of LAD and diagonal, and preserved LV systolic fxn, Med Rx;  d.  anterior STEMI in 01/2017 with DES to Proximal LAD  . Cardiomyopathy EF 35% on cath 06/30/14, new from jan 2015 07/20/2014  . Chest pain   . CHF (congestive heart failure) ([place])   . Chronic back pain    "all over" (09/17/2017)  . DDD (degenerative disc disease), cervical   . Depression   . GERD (gastroesophageal reflux disease)   . Headache    "a few/wk"  (09/17/2017)  . HTN (hypertension)   . Hypercholesterolemia   . Mental disorder   . Myocardial infarction ([place])    "I've had 7" (09/17/2017)  . Pulmonary edema   . Respiratory failure ([place])   . Sciatic pain   . Sleep apnea   . Stroke [facility])    a. multiple dating back to 2002; *I''ve had 5; LUE/LLE weaker since" (09/17/2017)  . Tibia fracture (l) leg  . Tobacco abuse   . Type 2 diabetes mellitus ([place])   . Unstable angina ([facility])     Medications:  Medications Prior to Admission  Medication Sig Dispense Refill Last Dose  . acetaminophen (TYLENOL) 325 MG tablet Take 2 tablets (650 mg total) by mouth every 4 (four) hours as needed for headache or mild pain.   unknown  . carvedilol (COREG) 3.125 MG tablet TAKE (1) TABLET BY MOUTH TWICE A DAY WITH MEALS. 60 tablet 11 02/11/2018 at 800a  . digoxin (LANOXIN) 0.125 MG tablet Take 1  tablet (0.125 mg total) by mouth daily. 30 tablet 3 02/11/2018 at Unknown time  . Evolocumab (REPATHA SURECLICK) 144 MG/ML SOAJ Inject 1 pen into the skin every 14 (fourteen) days. 2 pen 11 01/30/2018 at Unknown time  . ezetimibe (ZETIA) 10 MG tablet Take 1 tablet (10 mg total) by mouth daily. 30 tablet 3 02/11/2018 at Unknown time  . furosemide (LASIX) 40 MG tablet Take 80 mg (2 tabs) in am and 40 mg (1 tab) in pm (Patient taking differently: Take 40-80 mg by mouth 2 (two) times daily. Take 80 mg (2 tabs) in am and 40 mg (1 tab) in pm. *May take one tablet (in addition) as needed for weight gain greater than 3 pounds) 90 tablet 6 02/11/2018 at Unknown time  . gabapentin (NEURONTIN) 100 MG capsule Take 1 capsule (100 mg total) by mouth 2 (two) times daily. 60 capsule 0 02/11/2018 at Unknown time  . glipiZIDE (GLUCOTROL XL) 10 MG 24 hr tablet Take 1 tablet (10 mg total) by mouth daily. 30 tablet 1 02/11/2018 at Unknown time  . losartan (COZAAR) 25 MG tablet Take 0.5 tablets (12.5 mg total) by mouth daily. 15 tablet 3 02/11/2018 at Unknown time  . metolazone (ZAROXOLYN) 2.5 MG  tablet Take 1 tablet (2.5 mg total) by mouth daily as needed. For weight and fluid gain. Only if weight dose not come down with an extra Lasix. See discharge orders. 30 tablet 11 unknown  . nitroGLYCERIN (NITROSTAT) 0.4 MG SL tablet Place 1 tablet (0.4 mg total) under the tongue every 5 (five) minutes as needed for chest pain. 25 tablet 3 unknown  . potassium chloride SA (K-DUR,KLOR-CON) 20 MEQ tablet Take 1 tablet (20 mEq total) by mouth daily. 30 tablet 3 02/11/2018 at Unknown time  . spironolactone (ALDACTONE) 25 MG tablet Take 1 tablet (25 mg total) by mouth every evening. 30 tablet 6 02/10/2018 at Unknown time  . traMADol (ULTRAM) 50 MG tablet Take 50 mg by mouth every 6 (six) hours as needed for moderate pain or severe pain.   01/28/2018 at Unknown time  . warfarin (COUMADIN) 5 MG tablet TAKE 1&1/2 TABLETS (7.5MG ) BY MOUTH DAILY.TAKE AS DIRECTED BY COUMADIN CLINIC. 45 tablet 11 02/10/2018 at 1700    Assessment: 59 YOM on warfarin at home LV mural thrombus here with chest pain now s/p LHC. Pharmacy consulted to resume home warfarin therapy. INR was 2.2 on admission now down to 1.7. H/H and plt wnl.   Home warfarin dose: 7.5 mg daily   Goal of Therapy:  INR 2-3 Monitor platelets by anticoagulation protocol: Yes   Plan:  -Warfarin 10 mg once today -Per cards, resume IV heparin 4 hours after TR band is off. Will start IV heparin at 1200 units/hr. No bolus  -F/u 6 hr HL, daily CBC, and s/s of bleeding -Daily PT/INR  Albertina Parr, PharmD., BCPS Clinical Pharmacist Clinical phone for 02/14/18 until 3:30pm: [phone] If after 3:30pm, please call main pharmacy at: [phone]

## 2018-02-14 NOTE — Interval H&P Note (Signed)
2Cath Lab Visit (complete for each Cath Lab visit)  Clinical Evaluation Leading to the Procedure:   ACS: Yes.    Non-ACS:    Anginal Classification: CCS III  Anti-ischemic medical therapy: Minimal Therapy (1 class of medications)  Non-Invasive Test Results: No non-invasive testing performed  Prior CABG: No previous CABG      History and Physical Interval Note:  02/14/2018 12:53 PM  Daniel Li  has presented today for surgery, with the diagnosis of cp  The various methods of treatment have been discussed with the patient and family. After consideration of risks, benefits and other options for treatment, the patient has consented to  Procedure(s): LEFT HEART CATH AND CORONARY ANGIOGRAPHY (N/A) as a surgical intervention .  The patient's history has been reviewed, patient examined, no change in status, stable for surgery.  I have reviewed the patient's chart and labs.  Questions were answered to the patient's satisfaction.     Quay Burow

## 2018-02-14 NOTE — Progress Notes (Addendum)
Progress Note  Patient Name: Daniel Li Date of Encounter: 02/14/2018  Primary Cardiologist: Carlyle Dolly, MD   Subjective   Brief episodes of chest pain overnight. INR down to 1.7 this morning. Has been NPO since midnight.   Inpatient Medications    Scheduled Meds: . carvedilol  3.125 mg Oral BID WC  . clopidogrel  75 mg Oral Daily  . digoxin  0.125 mg Oral Daily  . Evolocumab  1 pen Subcutaneous Q14 Days  . ezetimibe  10 mg Oral Daily  . furosemide  80 mg Oral QAC breakfast   And  . furosemide  40 mg Oral q1800  . gabapentin  100 mg Oral BID  . insulin aspart  0-9 Units Subcutaneous TID WC  . losartan  12.5 mg Oral Daily  . potassium chloride SA  20 mEq Oral Daily  . spironolactone  25 mg Oral QPM   Continuous Infusions: . heparin    . nitroGLYCERIN 10 mcg/min (02/13/18 1000)   PRN Meds: acetaminophen **OR** acetaminophen, ALPRAZolam, morphine, ondansetron **OR** ondansetron (ZOFRAN) IV   Vital Signs    Vitals:   02/14/18 0300 02/14/18 0330 02/14/18 0400 02/14/18 0500  BP: 97/69 97/72 94/77    Pulse: 64 (!) 58    Resp: 11 11 15    Temp:   97.9 F (36.6 C)   TempSrc:   Oral   SpO2: 93% 94%    Weight:    173 lb 4.5 oz (78.6 kg)  Height:        Intake/Output Summary (Last 24 hours) at 02/14/2018 0740 Last data filed at 02/14/2018 0500 Gross per 24 hour  Intake 28.5 ml  Output 2650 ml  Net -2621.5 ml   Filed Weights   02/12/18 0500 02/13/18 0500 02/14/18 0500  Weight: 173 lb 4.5 oz (78.6 kg) 174 lb 13.2 oz (79.3 kg) 173 lb 4.5 oz (78.6 kg)    Telemetry    NSR, HR in mid-50's to 80's with occasional PVC's.  - Personally Reviewed  ECG    No new tracings.   Physical Exam   General: Well developed, well nourished African American male appearing in no acute distress. Head: Normocephalic, atraumatic.  Neck: Supple without bruits, JVD not elevated. Lungs:  Resp regular and unlabored, CTA without wheezing or rales. Heart: RRR, S1, S2, no  S3, S4, or murmur; no rub. Abdomen: Soft, non-tender, non-distended with normoactive bowel sounds. No hepatomegaly. No rebound/guarding. No obvious abdominal masses. Extremities: No clubbing, cyanosis, or edema. Distal pedal pulses are 2+ bilaterally. Neuro: Alert and oriented X 3. Moves all extremities spontaneously. Psych: Normal affect.  Labs    Chemistry Recent Labs  Lab 02/12/18 0650 02/13/18 0948 02/14/18 0502  NA 141 138 139  K 4.1 3.6 4.2  CL 102 98* 98*  CO2 31 29 31   GLUCOSE 85 218* 114*  BUN 9 13 17   CREATININE 1.03 1.04 1.13  CALCIUM 9.1 9.7 9.7  GFRNONAA >60 >60 >60  GFRAA >60 >60 >60  ANIONGAP 8 11 10      Hematology Recent Labs  Lab 02/11/18 1538 02/13/18 0948  WBC 8.7 6.9  RBC 5.29 5.42  HGB 13.6 13.9  HCT 42.4 43.3  MCV 80.2 79.9  MCH 25.7* 25.6*  MCHC 32.1 32.1  RDW 16.2* 16.1*  PLT 193 183    Cardiac Enzymes Recent Labs  Lab 02/11/18 1913 02/12/18 0038 02/12/18 0650  TROPONINI 1.11* 1.67* 1.46*    Recent Labs  Lab 02/11/18 1541  TROPIPOC 0.07  BNPNo results for input(s): BNP, PROBNP in the last 168 hours.   DDimer No results for input(s): DDIMER in the last 168 hours.   Radiology    No results found.  Cardiac Studies   Echocardiogram: 02/12/2018 Study Conclusions  - Left ventricle: The cavity size was mildly to moderately dilated.   Wall thickness was normal. Systolic function was severely   reduced. The estimated ejection fraction was in the range of 20%   to 25%. Diffuse hypokinesis. Doppler parameters are consistent   with abnormal left ventricular relaxation (grade 1 diastolic   dysfunction). - Regional wall motion abnormality: Akinesis of the basal-mid   anteroseptal and apical myocardium. - Aortic valve: Valve area (VTI): 1.87 cm^2. Valve area (Vmax):   1.93 cm^2. Valve area (Vmean): 1.7 cm^2. - Mitral valve: There was mild regurgitation. - Left atrium: The atrium was moderately dilated. - Technically  adequate study.  Patient Profile     62 y.o. male w/ PMH of CAD (s/p BMS to D1 and OM2 in 2007, BMS to RCA in 02/2012, CTO of LCx by cath in 2014, and most recently anterior STEMI in 01/2017 with DES to Proximal-LAD), chronic systolic CHF (EF 72-53% by echo in 05/2017), ischemic cardiomyopathy (s/p St. Jude ICD placement in 08/2017), history of LV thrombus (on Coumadin), moderate to severe MR, HTN, HLD, Type 2 DM, prior CVA's and OSAwho presented to [facility] ED on 02/11/2018 for evaluation of chest pain.   Assessment & Plan    1. NSTEMI in the setting of known CAD - s/p BMS to D1 and OM2 in 2007, BMS to RCA in 02/2012, CTO of LCx by cath in 2014, and most recently anterior STEMI in 01/2017 with DES to Proximal-LAD. Most recent ischemic evaluation was a catheterization in 05/2017 which showed triple vessel CAD with no focal targets for PCI as outlined above (troponin values were flat at 0.09 - 0.14 that admission).  - presented for evaluation chest discomfort and troponin values peaked at 1.67. Coumadin held on admission and INR down to 1.71 this AM. Heparin has been initiated. Plan is for transfer to [facility] this afternoon for cardiac catheterization (can have clear liquids for breakfast then NPO afterwards). - Continue Plavix and BB. Intolerant to statins (on Repatha and Zetia)   2. Chronic Systolic CHF - the patient has a known reduced EF of 20-25% by echo in 05/2017 with similar findings by repeat echo this admission.  - continue PTA Coreg, Losartan, Spironolactone, and Lasix at current dosing. Will hold Losartan and Lasix this AM due to cath later today.   3. Ischemic Cardiomyopathy - s/p St. Jude ICD placement in 08/2017. Followed by Dr. Rayann Heman as an outpatient.  4. LV Thrombus - Coumadin currently held due to catheterization. INR currently 1.71 and Heparin has been initiated.   5. HLD - developed Rhabdomyolysis with statins in the past. Currently on Repatha and Zetia. LDL at 51  by FLP in 09/2017.  For questions or updates, please contact [place] Please consult [email or web address] for contact info under Cardiology/STEMI.   Arna Medici , PA-C 7:40 AM 02/14/2018 Pager: [phone]  Attending note Patient seen and discussed with PA Ahmed Prima, I agree with her documentation above. Admitted with NSTEMI, extensive history of CAD and ICM. Echo stable this admit. Delay in cath due to elevated INR on home counadin, INR today acceptable for cath. Plan for transfer today and cath . Continue current medical therapy   Carlyle Dolly MD

## 2018-02-14 NOTE — H&P (View-Only) (Signed)
Progress Note  Patient Name: Daniel Li Date of Encounter: 02/14/2018  Primary Cardiologist: Carlyle Dolly, MD   Subjective   Brief episodes of chest pain overnight. INR down to 1.7 this morning. Has been NPO since midnight.   Inpatient Medications    Scheduled Meds: . carvedilol  3.125 mg Oral BID WC  . clopidogrel  75 mg Oral Daily  . digoxin  0.125 mg Oral Daily  . Evolocumab  1 pen Subcutaneous Q14 Days  . ezetimibe  10 mg Oral Daily  . furosemide  80 mg Oral QAC breakfast   And  . furosemide  40 mg Oral q1800  . gabapentin  100 mg Oral BID  . insulin aspart  0-9 Units Subcutaneous TID WC  . losartan  12.5 mg Oral Daily  . potassium chloride SA  20 mEq Oral Daily  . spironolactone  25 mg Oral QPM   Continuous Infusions: . heparin    . nitroGLYCERIN 10 mcg/min (02/13/18 1000)   PRN Meds: acetaminophen **OR** acetaminophen, ALPRAZolam, morphine, ondansetron **OR** ondansetron (ZOFRAN) IV   Vital Signs    Vitals:   02/14/18 0300 02/14/18 0330 02/14/18 0400 02/14/18 0500  BP: 97/69 97/72 94/77    Pulse: 64 (!) 58    Resp: 11 11 15    Temp:   97.9 F (36.6 C)   TempSrc:   Oral   SpO2: 93% 94%    Weight:    173 lb 4.5 oz (78.6 kg)  Height:        Intake/Output Summary (Last 24 hours) at 02/14/2018 0740 Last data filed at 02/14/2018 0500 Gross per 24 hour  Intake 28.5 ml  Output 2650 ml  Net -2621.5 ml   Filed Weights   02/12/18 0500 02/13/18 0500 02/14/18 0500  Weight: 173 lb 4.5 oz (78.6 kg) 174 lb 13.2 oz (79.3 kg) 173 lb 4.5 oz (78.6 kg)    Telemetry    NSR, HR in mid-50's to 80's with occasional PVC's.  - Personally Reviewed  ECG    No new tracings.   Physical Exam   General: Well developed, well nourished African American male appearing in no acute distress. Head: Normocephalic, atraumatic.  Neck: Supple without bruits, JVD not elevated. Lungs:  Resp regular and unlabored, CTA without wheezing or rales. Heart: RRR, S1, S2, no  S3, S4, or murmur; no rub. Abdomen: Soft, non-tender, non-distended with normoactive bowel sounds. No hepatomegaly. No rebound/guarding. No obvious abdominal masses. Extremities: No clubbing, cyanosis, or edema. Distal pedal pulses are 2+ bilaterally. Neuro: Alert and oriented X 3. Moves all extremities spontaneously. Psych: Normal affect.  Labs    Chemistry Recent Labs  Lab 02/12/18 0650 02/13/18 0948 02/14/18 0502  NA 141 138 139  K 4.1 3.6 4.2  CL 102 98* 98*  CO2 31 29 31   GLUCOSE 85 218* 114*  BUN 9 13 17   CREATININE 1.03 1.04 1.13  CALCIUM 9.1 9.7 9.7  GFRNONAA >60 >60 >60  GFRAA >60 >60 >60  ANIONGAP 8 11 10      Hematology Recent Labs  Lab 02/11/18 1538 02/13/18 0948  WBC 8.7 6.9  RBC 5.29 5.42  HGB 13.6 13.9  HCT 42.4 43.3  MCV 80.2 79.9  MCH 25.7* 25.6*  MCHC 32.1 32.1  RDW 16.2* 16.1*  PLT 193 183    Cardiac Enzymes Recent Labs  Lab 02/11/18 1913 02/12/18 0038 02/12/18 0650  TROPONINI 1.11* 1.67* 1.46*    Recent Labs  Lab 02/11/18 1541  TROPIPOC 0.07  BNPNo results for input(s): BNP, PROBNP in the last 168 hours.   DDimer No results for input(s): DDIMER in the last 168 hours.   Radiology    No results found.  Cardiac Studies   Echocardiogram: 02/12/2018 Study Conclusions  - Left ventricle: The cavity size was mildly to moderately dilated.   Wall thickness was normal. Systolic function was severely   reduced. The estimated ejection fraction was in the range of 20%   to 25%. Diffuse hypokinesis. Doppler parameters are consistent   with abnormal left ventricular relaxation (grade 1 diastolic   dysfunction). - Regional wall motion abnormality: Akinesis of the basal-mid   anteroseptal and apical myocardium. - Aortic valve: Valve area (VTI): 1.87 cm^2. Valve area (Vmax):   1.93 cm^2. Valve area (Vmean): 1.7 cm^2. - Mitral valve: There was mild regurgitation. - Left atrium: The atrium was moderately dilated. - Technically  adequate study.  Patient Profile     62 y.o. male w/ PMH of CAD (s/p BMS to D1 and OM2 in 2007, BMS to RCA in 02/2012, CTO of LCx by cath in 2014, and most recently anterior STEMI in 01/2017 with DES to Proximal-LAD), chronic systolic CHF (EF 81-27% by echo in 05/2017), ischemic cardiomyopathy (s/p St. 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   Assessment & Plan    1. NSTEMI in the setting of known CAD - s/p BMS to D1 and OM2 in 2007, BMS to RCA in 02/2012, CTO of LCx by cath in 2014, and most recently anterior STEMI in 01/2017 with DES to Proximal-LAD. Most recent ischemic evaluation was a catheterization in 05/2017 which showed triple vessel CAD with no focal targets for PCI as outlined above (troponin values were flat at 0.09 - 0.14 that admission).  - presented for evaluation chest discomfort and troponin values peaked at 1.67. Coumadin held on admission and INR down to 1.71 this AM. Heparin has been initiated. Plan is for transfer to [facility] this afternoon for cardiac catheterization (can have clear liquids for breakfast then NPO afterwards). - Continue Plavix and BB. Intolerant to statins (on Repatha and Zetia)   2. Chronic Systolic CHF - the patient has a known reduced EF of 20-25% by echo in 05/2017 with similar findings by repeat echo this admission.  - continue PTA Coreg, Losartan, Spironolactone, and Lasix at current dosing. Will hold Losartan and Lasix this AM due to cath later today.   3. Ischemic Cardiomyopathy - s/p St. Jude ICD placement in 08/2017. Followed by Dr. Rayann Heman as an outpatient.  4. LV Thrombus - Coumadin currently held due to catheterization. INR currently 1.71 and Heparin has been initiated.   5. HLD - developed Rhabdomyolysis with statins in the past. Currently on Repatha and Zetia. LDL at 51  by FLP in 09/2017.  For questions or updates, please contact [place] Please consult [email or web address] for contact info under Cardiology/STEMI.   Arna Medici , PA-C 7:40 AM 02/14/2018 Pager: [phone]  Attending note Patient seen and discussed with PA Ahmed Prima, I agree with her documentation above. Admitted with NSTEMI, extensive history of CAD and ICM. Echo stable this admit. Delay in cath due to elevated INR on home counadin, INR today acceptable for cath. Plan for transfer today and cath . Continue current medical therapy   Carlyle Dolly MD

## 2018-02-14 NOTE — Progress Notes (Signed)
Received pt from [facility] via [facility] alert and oriented X4.  Skin warm and dry with complaint of chest pressure.  IVF infusing with Nitro and Heparin drip.  Consent signed and pt placed on beside monitor.  BS check at 105mg /dl.  Second IV started in the right antecubital. Tolerated well.  No distress noted at this time.

## 2018-02-14 NOTE — Progress Notes (Signed)
[facility] BAND REMOVAL  LOCATION:   Right radial  DEFLATED PER PROTOCOL:    Yes.    TIME BAND OFF / DRESSING APPLIED:    1700p clean gauze with tegaderm   SITE UPON ARRIVAL:    Level 0  SITE AFTER BAND REMOVAL:    Level 0  CIRCULATION SENSATION AND MOVEMENT:    Within Normal Limits   Yes.    COMMENTS:   No problem at site or with hand movement

## 2018-02-15 DIAGNOSIS — I25119 Atherosclerotic heart disease of native coronary artery with unspecified angina pectoris: Secondary | ICD-10-CM

## 2018-02-15 DIAGNOSIS — I255 Ischemic cardiomyopathy: Secondary | ICD-10-CM

## 2018-02-15 LAB — HEPARIN LEVEL (UNFRACTIONATED)
Heparin Unfractionated: 0.52 IU/mL (ref 0.30–0.70)
Heparin Unfractionated: 0.69 IU/mL (ref 0.30–0.70)

## 2018-02-15 LAB — GLUCOSE, CAPILLARY
GLUCOSE-CAPILLARY: 118 mg/dL — AB (ref 65–99)
GLUCOSE-CAPILLARY: 184 mg/dL — AB (ref 65–99)
Glucose-Capillary: 114 mg/dL — ABNORMAL HIGH (ref 65–99)
Glucose-Capillary: 233 mg/dL — ABNORMAL HIGH (ref 65–99)

## 2018-02-15 LAB — PROTIME-INR
INR: 1.43
Prothrombin Time: 17.3 seconds — ABNORMAL HIGH (ref 11.4–15.2)

## 2018-02-15 LAB — CBC
HEMATOCRIT: 41.8 % (ref 39.0–52.0)
Hemoglobin: 13.9 g/dL (ref 13.0–17.0)
MCH: 26 pg (ref 26.0–34.0)
MCHC: 33.3 g/dL (ref 30.0–36.0)
MCV: 78.3 fL (ref 78.0–100.0)
Platelets: 205 10*3/uL (ref 150–400)
RBC: 5.34 MIL/uL (ref 4.22–5.81)
RDW: 16.3 % — ABNORMAL HIGH (ref 11.5–15.5)
WBC: 5.9 10*3/uL (ref 4.0–10.5)

## 2018-02-15 LAB — BASIC METABOLIC PANEL
Anion gap: 8 (ref 5–15)
BUN: 13 mg/dL (ref 6–20)
CO2: 27 mmol/L (ref 22–32)
Calcium: 9.2 mg/dL (ref 8.9–10.3)
Chloride: 105 mmol/L (ref 101–111)
Creatinine, Ser: 1.08 mg/dL (ref 0.61–1.24)
GFR calc Af Amer: >60 mL/min (ref >60)
GFR calc non Af Amer: >60 mL/min (ref >60)
GLUCOSE: 113 mg/dL — AB (ref 65–99)
POTASSIUM: 4.1 mmol/L (ref 3.5–5.1)
Sodium: 140 mmol/L (ref 135–145)

## 2018-02-15 MED ORDER — WARFARIN SODIUM 7.5 MG PO TABS
7.5000 mg | ORAL_TABLET | Freq: Once | ORAL | Status: AC
  Administered 2018-02-15: 7.5 mg via ORAL
  Filled 2018-02-15: qty 1

## 2018-02-15 NOTE — Progress Notes (Signed)
Progress Note  Patient Name: Daniel Li Date of Encounter: 02/15/2018  Primary Cardiologist: Dr. Carlyle Dolly  Subjective   No chest pain or shortness of breath lying supine.  No palpitations.  No abdominal pain.  Inpatient Medications    Scheduled Meds: . aspirin  81 mg Oral Daily  . carvedilol  3.125 mg Oral BID WC  . clopidogrel  75 mg Oral Daily  . digoxin  0.125 mg Oral Daily  . Evolocumab  1 pen Subcutaneous Q14 Days  . ezetimibe  10 mg Oral Daily  . furosemide  80 mg Oral QAC breakfast   And  . furosemide  40 mg Oral q1800  . gabapentin  100 mg Oral BID  . insulin aspart  0-9 Units Subcutaneous TID WC  . losartan  12.5 mg Oral Daily  . potassium chloride SA  20 mEq Oral Daily  . sodium chloride flush  3 mL Intravenous Q12H  . spironolactone  25 mg Oral QPM  . Warfarin - Pharmacist Dosing Inpatient   Does not apply q1800   Continuous Infusions: . sodium chloride    . heparin 1,200 Units/hr (02/14/18 2052)  . nitroGLYCERIN Stopped (02/14/18 1315)   PRN Meds: sodium chloride, acetaminophen, ALPRAZolam, morphine injection, ondansetron **OR** ondansetron (ZOFRAN) IV, sodium chloride flush   Vital Signs    Vitals:   02/15/18 0456 02/15/18 0500 02/15/18 0911 02/15/18 0930  BP: 103/68  107/79 107/80  Pulse: 60 71 68 75  Resp:  15    Temp: 98 F (36.7 C)   97.6 F (36.4 C)  TempSrc: Oral   Oral  SpO2:  95%  93%  Weight:  171 lb 12.8 oz (77.9 kg)    Height:        Intake/Output Summary (Last 24 hours) at 02/15/2018 1034 Last data filed at 02/15/2018 0615 Gross per 24 hour  Intake 475.6 ml  Output 400 ml  Net 75.6 ml   Filed Weights   02/13/18 0500 02/14/18 0500 02/15/18 0500  Weight: 174 lb 13.2 oz (79.3 kg) 173 lb 4.5 oz (78.6 kg) 171 lb 12.8 oz (77.9 kg)    Telemetry    Sinus rhythm.  Personally reviewed.  Physical Exam   GEN: No acute distress.   Neck: No JVD. Cardiac: RRR, no  gallop.  Respiratory: Nonlabored. Clear to  auscultation bilaterally. GI: Soft, nontender, bowel sounds present. MS: No edema; No deformity. Neuro:  Nonfocal. Psych: Alert and oriented x 3. Normal affect.  Labs    Chemistry Recent Labs  Lab 02/13/18 0948 02/14/18 0502 02/15/18 0508  NA 138 139 140  K 3.6 4.2 4.1  CL 98* 98* 105  CO2 29 31 27   GLUCOSE 218* 114* 113*  BUN 13 17 13   CREATININE 1.04 1.13 1.08  CALCIUM 9.7 9.7 9.2  GFRNONAA >60 >60 >60  GFRAA >60 >60 >60  ANIONGAP 11 10 8      Hematology Recent Labs  Lab 02/11/18 1538 02/13/18 0948 02/15/18 0508  WBC 8.7 6.9 5.9  RBC 5.29 5.42 5.34  HGB 13.6 13.9 13.9  HCT 42.4 43.3 41.8  MCV 80.2 79.9 78.3  MCH 25.7* 25.6* 26.0  MCHC 32.1 32.1 33.3  RDW 16.2* 16.1* 16.3*  PLT 193 183 205    Cardiac Enzymes Recent Labs  Lab 02/11/18 1913 02/12/18 0038 02/12/18 0650  TROPONINI 1.11* 1.67* 1.46*    Recent Labs  Lab 02/11/18 1541  TROPIPOC 0.07     Radiology    No results found.  Cardiac Studies   Cardiac catheterization 02/14/2018:  RPDA lesion is 75% stenosed.  Post Atrio lesion is 100% stenosed.  Ost Cx to Prox Cx lesion is 100% stenosed.  Previously placed Ost LAD to Prox LAD stent (unknown type) is widely patent.  Prox LAD lesion is 50% stenosed.  Previously placed Ost 1st Diag stent (unknown type) is widely patent.  1st Diag lesion is 75% stenosed.  IMPRESSION:Mr. Kurtzman's anatomy is unchanged compared to his previous angiogram which I performed a year ago in setting of an anterior STEMI. This proximal LAD is widely patent. 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 He has a total distal PLA branch and a 75% focal proximal PDA. I did not perform left ventriculography obtain left heart pressures because of a known left ventricular. Eye suspected this was demand ischemia, type II non-STEMI. He has severe LV dysfunction and does have an ICD in place for primary prevention. Continue medical therapy  will be recommended. The sheath was removed and a TR band was placed on the right wrist to achieve patient hemostasis. The patient left the lab in stable condition. He can be re-coumadinized for his mural thrombus.  Echocardiogram 02/12/2018: Study Conclusions  - Left ventricle: The cavity size was mildly to moderately dilated.   Wall thickness was normal. Systolic function was severely   reduced. The estimated ejection fraction was in the range of 20%   to 25%. Diffuse hypokinesis. Doppler parameters are consistent   with abnormal left ventricular relaxation (grade 1 diastolic   dysfunction). - Regional wall motion abnormality: Akinesis of the basal-mid   anteroseptal and apical myocardium. - Aortic valve: Valve area (VTI): 1.87 cm^2. Valve area (Vmax):   1.93 cm^2. Valve area (Vmean): 1.7 cm^2. - Mitral valve: There was mild regurgitation. - Left atrium: The atrium was moderately dilated. - Technically adequate study.  Patient Profile     62 y.o. male with a history of CAD (s/p BMS to D1 and OM2 in 2007, BMS to RCA in 02/2012, CTO of LCx by cath in 2014, and most recently anterior STEMI in 01/2017 with DES to Proximal-LAD), chronic systolic CHF (EF 09-81% by echo in 05/2017), ischemic cardiomyopathy (s/p St. 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  Assessment & Plan    1. NSTEMI, likely type 2 event.  Coronary anatomy is stable by follow-up cardiac catheterization.  Plan to continue medical therapy.  2.  Multivessel CAD status post BMS to the first diagonal and second obtuse marginal in 2007, BMS to the RCA in 2013, and DES to proximal LAD in 2018.  He has known occlusion of the circumflex with collaterals and overall stable anatomy by follow-up cardiac catheterization.  3.  Chronic systolic heart failure with LVEF 20-25%.   4.  History of LV mural thrombus, on  chronic Coumadin.  Most recent echocardiogram does not indicate presence of active thrombus.  5.  Hyperlipidemia, on Repatha and Zetia.  He has a history of statin intolerance due to rhabdomyolysis.  6.  St. Jude ICD in place.  No recent device discharges.  Plan to continue medical therapy.  Currently on aspirin, Plavix, and Coumadin - will stop aspirin for now particularly in the absence of any recent coronary intervention.  Continue Coreg, Lanoxin, Cozaar, Zetia, Aldactone, Lasix, and potassium supplements.  Increase activity as tolerated.  Follow-up BMET in a.m.  He  will stay on heparin for now, but doubt that he will require a long-term bridge while resuming Coumadin in the absence of an active LV mural thrombus by echocardiogram.  Possible discharge home tomorrow.  Signed, Rozann Lesches, MD  02/15/2018, 10:34 AM

## 2018-02-15 NOTE — Progress Notes (Signed)
Patient complained of lightheadedness.  SBP=103.  CBG= 184.  Patient also complained of right chest pain 3 out of 10.  Patient given 650mg  tylenol.  ARob Hickman, PA notified, stated to continue monitoring patient.

## 2018-02-15 NOTE — Progress Notes (Signed)
Patient ID: Daniel Li, male   DOB: 1956/10/13, 62 y.o.   MRN: [id number]  PROGRESS NOTE    Daniel Li  IHK:[id number] DOB: 08-Jun-1956 DOA: 02/11/2018  PCP: [name], NP   Brief Narrative:  62 y.o. male with a history of CAD (s/p BMS to D1 and OM2 in 2007, BMS to RCA in 02/2012, CTO of LCx by cath in 2014, and most recently anterior STEMI in 01/2017 with DES to Proximal-LAD), chronic systolic CHF (EF 75-64% by echo in 05/2017), ischemic cardiomyopathy (s/p St. 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 Pt presented with NSTEMI.   Assessment & Plan:   Active Problems: NSTEMI (non-ST elevated myocardial infarction) likely type 2 event - Coronary anatomy is stable by follow-up cardiac catheterization - Continue medical therapy   Multivessel CAD status post BMS to the first diagonal and second obtuse marginal in 2007, BMS to the RCA in 2013, and DES to proximal LAD in 2018 - Overall stable anatomy .  Chronic systolic heart failure with LVEF 20-25% / St. Jude ICD in place / Ischemic cardiomyopathy  - Cardio following  - No recent discharges   History of LV mural thrombus, on chronic Coumadin - Continue current AC  Hyperlipidemia - On Repatha and Zetia - History of statin intolerance due to rhabdomyolysis.    DVT prophylaxis: Heparin drip, coumadin Code Status: full code  Family Communication: no family at the bedside  Disposition Plan: home once cleared by cardio   Consultants:   Cardiology   Procedures:   Cardiac catheterization 02/14/2018: The proximal LAD is widely patent. 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 He has a total distal PLA branch and a 75% focal proximal PDA. Suspected this was demand ischemia, type II non-STEMI. He has severe LV dysfunction and does have an ICD in place for primary prevention.     Echocardiogram 02/12/2018: Left ventricle: The cavity size was mildly to moderately dilated. Wall thickness was normal. Systolic function was severely reduced. The estimated ejection fraction was in the range of 20% to 25%. Diffuse hypokinesis. Doppler parameters are consistent with grade 1 diastolic dysfunction. Regional wall motion abnormality:   Antimicrobials:   None    Subjective: No overnight events.  Objective: Vitals:   02/15/18 0456 02/15/18 0500 02/15/18 0911 02/15/18 0930  BP: 103/68  107/79 107/80  Pulse: 60 71 68 75  Resp:  15    Temp: 98 F (36.7 C)   97.6 F (36.4 C)  TempSrc: Oral   Oral  SpO2:  95%  93%  Weight:  77.9 kg (171 lb 12.8 oz)    Height:        Intake/Output Summary (Last 24 hours) at 02/15/2018 1226 Last data filed at 02/15/2018 0615 Gross per 24 hour  Intake 475.6 ml  Output 400 ml  Net 75.6 ml   Filed Weights   02/13/18 0500 02/14/18 0500 02/15/18 0500  Weight: 79.3 kg (174 lb 13.2 oz) 78.6 kg (173 lb 4.5 oz) 77.9 kg (171 lb 12.8 oz)    Examination:  General exam: Appears calm and comfortable  Respiratory system: Clear to auscultation. Respiratory effort normal. Cardiovascular system: S1 & S2 heard, RRR.  Gastrointestinal system: Abdomen is nondistended, soft and nontender. No organomegaly or masses felt. Normal bowel sounds heard. Central nervous system: Alert and oriented. No focal neurological deficits. Extremities: Symmetric 5 x 5 power. Skin:  No rashes, lesions or ulcers Psychiatry: Judgement and insight appear normal. Mood & affect appropriate.   Data Reviewed: I have personally reviewed following labs and imaging studies  CBC: Recent Labs  Lab 02/11/18 1538 02/13/18 0948 02/15/18 0508  WBC 8.7 6.9 5.9  HGB 13.6 13.9 13.9  HCT 42.4 43.3 41.8  MCV 80.2 79.9 78.3  PLT 193 183 630   Basic Metabolic Panel: Recent Labs  Lab 02/11/18 1538 02/12/18 0038 02/12/18 0650 02/13/18 0948 02/14/18 0502 02/15/18 0508  NA  140  --  141 138 139 140  K 3.9  --  4.1 3.6 4.2 4.1  CL 104  --  102 98* 98* 105  CO2 28  --  31 29 31 27   GLUCOSE 112*  --  85 218* 114* 113*  BUN 12  --  9 13 17 13   CREATININE 1.05  --  1.03 1.04 1.13 1.08  CALCIUM 9.3  --  9.1 9.7 9.7 9.2  MG  --  2.2 1.9  --   --   --    GFR: Estimated Creatinine Clearance: 70.5 mL/min (by C-G formula based on SCr of 1.08 mg/dL). Liver Function Tests: No results for input(s): AST, ALT, ALKPHOS, BILITOT, PROT, ALBUMIN in the last 168 hours. No results for input(s): LIPASE, AMYLASE in the last 168 hours. No results for input(s): AMMONIA in the last 168 hours. Coagulation Profile: Recent Labs  Lab 02/11/18 1913 02/12/18 0038 02/13/18 0948 02/14/18 0502 02/15/18 0508  INR 2.73 2.74 2.20 1.71 1.43   Cardiac Enzymes: Recent Labs  Lab 02/11/18 1913 02/12/18 0038 02/12/18 0650  TROPONINI 1.11* 1.67* 1.46*   BNP (last 3 results) No results for input(s): PROBNP in the last 8760 hours. HbA1C: No results for input(s): HGBA1C in the last 72 hours. CBG: Recent Labs  Lab 02/14/18 1206 02/14/18 1504 02/14/18 2110 02/15/18 0731 02/15/18 1153  GLUCAP 105* 78 150* 114* 118*   Lipid Profile: No results for input(s): CHOL, HDL, LDLCALC, TRIG, CHOLHDL, LDLDIRECT in the last 72 hours. Thyroid Function Tests: No results for input(s): TSH, T4TOTAL, FREET4, T3FREE, THYROIDAB in the last 72 hours. Anemia Panel: No results for input(s): VITAMINB12, FOLATE, FERRITIN, TIBC, IRON, RETICCTPCT in the last 72 hours. Urine analysis: No results found for: COLORURINE, APPEARANCEUR, LABSPEC, PHURINE, GLUCOSEU, HGBUR, BILIRUBINUR, KETONESUR, PROTEINUR, UROBILINOGEN, NITRITE, LEUKOCYTESUR Sepsis Labs: @LABRCNTIP (procalcitonin:4,lacticidven:4)    MRSA PCR Screening     Status: None   Collection Time: 02/11/18  9:42 PM  Result Value Ref Range Status   MRSA by PCR NEGATIVE NEGATIVE Final      Radiology Studies: Dg Chest 2 View Result Date:  02/11/2018 No acute abnormality noted.     Scheduled Meds: . aspirin  81 mg Oral Daily  . carvedilol  3.125 mg Oral BID WC  . clopidogrel  75 mg Oral Daily  . digoxin  0.125 mg Oral Daily  . ezetimibe  10 mg Oral Daily  . furosemide  80 mg Oral QAC breakfast  . furosemide  40 mg Oral q1800  . gabapentin  100 mg Oral BID  . insulin aspart  0-9 Units Subcutaneous TID WC  . losartan  12.5 mg Oral Daily  . potassium chloride  20 mEq Oral Daily  . spironolactone  25 mg Oral QPM  . Warfarin    Does not apply q1800   Continuous Infusions: . sodium chloride    . heparin 1,200 Units/hr (02/14/18 2052)  . nitroGLYCERIN Stopped (02/14/18 1315)     LOS:  3 days    Time spent: 25 minutes  Greater than 50% of the time spent on counseling and coordinating the care.   Leisa Lenz, MD [facility] Pager [phone]  If 7PM-7AM, please contact night-coverage [email or web address] Password [facility] 02/15/2018, 12:26 PM

## 2018-02-15 NOTE — Progress Notes (Signed)
ANTICOAGULATION CONSULT NOTE - Follow Up Consult  Pharmacy Consult for heparin Indication: LV thrombus  Labs: Recent Labs    02/12/18 0650 02/13/18 0948 02/14/18 0502 02/15/18 0508  HGB  --  13.9  --  13.9  HCT  --  43.3  --  41.8  PLT  --  183  --  205  LABPROT  --  24.3* 19.9*  --   INR  --  2.20 1.71  --   HEPARINUNFRC  --   --   --  0.52  CREATININE 1.03 1.04 1.13  --   TROPONINI 1.46*  --   --   --     Assessment/Plan:  62yo male therapeutic on heparin with initial dosing while INR low. Will continue gtt at current rate and confirm stable with additional level.   Wynona Neat, PharmD, BCPS  02/15/2018,6:12 AM

## 2018-02-15 NOTE — Progress Notes (Signed)
ANTICOAGULATION CONSULT NOTE - Initial Consult  Pharmacy Consult for warfarin/heparin Indication: LV mural thrombus  Allergies  Allergen Reactions  . Pork-Derived Products Other (See Comments)    Does not eat for religious reasons - okay with using IV heparin    Patient Measurements: Height: 5\' 6"  (167.6 cm) Weight: 171 lb 12.8 oz (77.9 kg) IBW/kg (Calculated) : 63.8  Heparin dosing weight: 78 kg   Vital Signs: Temp: 97.6 F (36.4 C) (04/20 0930) Temp Source: Oral (04/20 0930) BP: 107/80 (04/20 0930) Pulse Rate: 75 (04/20 0930)  Labs: Recent Labs    02/13/18 0948 02/14/18 0502 02/15/18 0508 02/15/18 1249  HGB 13.9  --  13.9  --   HCT 43.3  --  41.8  --   PLT 183  --  205  --   LABPROT 24.3* 19.9* 17.3*  --   INR 2.20 1.71 1.43  --   HEPARINUNFRC  --   --  0.52 0.69  CREATININE 1.04 1.13 1.08  --     Estimated Creatinine Clearance: 70.5 mL/min (by C-G formula based on SCr of 1.08 mg/dL).   Medical History: Past Medical History:  Diagnosis Date  . AICD (automatic cardioverter/defibrillator) present   . Anxiety   . Arthritis    "all over" (09/17/2017)  . Burn   . CAD (coronary artery disease)    a. NSTEMI s/p BMS to 1st Diagonal and distal OM2 in 2007; b. STEMI 03/26/12 s/p BMS to RCA; c. NSTEMI 10/2012 : CTO of LCx (unable to open with PCI) and PL branch, mod dz of LAD and diagonal, and preserved LV systolic fxn, Med Rx;  d.  anterior STEMI in 01/2017 with DES to Proximal LAD  . Cardiomyopathy EF 35% on cath 06/30/14, new from jan 2015 07/20/2014  . Chest pain   . CHF (congestive heart failure) ([place])   . Chronic back pain    "all over" (09/17/2017)  . DDD (degenerative disc disease), cervical   . Depression   . GERD (gastroesophageal reflux disease)   . Headache    "a few/wk" (09/17/2017)  . HTN (hypertension)   . Hypercholesterolemia   . Mental disorder   . Myocardial infarction ([place])    "I've had 7" (09/17/2017)  . Pulmonary edema   . Respiratory  failure ([place])   . Sciatic pain   . Sleep apnea   . Stroke [facility])    a. multiple dating back to 2002; *I''ve had 5; LUE/LLE weaker since" (09/17/2017)  . Tibia fracture (l) leg  . Tobacco abuse   . Type 2 diabetes mellitus ([place])   . Unstable angina ([facility])     Medications:  Medications Prior to Admission  Medication Sig Dispense Refill Last Dose  . acetaminophen (TYLENOL) 325 MG tablet Take 2 tablets (650 mg total) by mouth every 4 (four) hours as needed for headache or mild pain.   unknown  . carvedilol (COREG) 3.125 MG tablet TAKE (1) TABLET BY MOUTH TWICE A DAY WITH MEALS. 60 tablet 11 02/11/2018 at 800a  . digoxin (LANOXIN) 0.125 MG tablet Take 1 tablet (0.125 mg total) by mouth daily. 30 tablet 3 02/11/2018 at Unknown time  . Evolocumab (REPATHA SURECLICK) 408 MG/ML SOAJ Inject 1 pen into the skin every 14 (fourteen) days. 2 pen 11 01/30/2018 at Unknown time  . ezetimibe (ZETIA) 10 MG tablet Take 1 tablet (10 mg total) by mouth daily. 30 tablet 3 02/11/2018 at Unknown time  . furosemide (LASIX) 40 MG tablet Take 80 mg (2  tabs) in am and 40 mg (1 tab) in pm (Patient taking differently: Take 40-80 mg by mouth 2 (two) times daily. Take 80 mg (2 tabs) in am and 40 mg (1 tab) in pm. *May take one tablet (in addition) as needed for weight gain greater than 3 pounds) 90 tablet 6 02/11/2018 at Unknown time  . gabapentin (NEURONTIN) 100 MG capsule Take 1 capsule (100 mg total) by mouth 2 (two) times daily. 60 capsule 0 02/11/2018 at Unknown time  . glipiZIDE (GLUCOTROL XL) 10 MG 24 hr tablet Take 1 tablet (10 mg total) by mouth daily. 30 tablet 1 02/11/2018 at Unknown time  . losartan (COZAAR) 25 MG tablet Take 0.5 tablets (12.5 mg total) by mouth daily. 15 tablet 3 02/11/2018 at Unknown time  . metolazone (ZAROXOLYN) 2.5 MG tablet Take 1 tablet (2.5 mg total) by mouth daily as needed. For weight and fluid gain. Only if weight dose not come down with an extra Lasix. See discharge orders. 30 tablet 11 unknown   . nitroGLYCERIN (NITROSTAT) 0.4 MG SL tablet Place 1 tablet (0.4 mg total) under the tongue every 5 (five) minutes as needed for chest pain. 25 tablet 3 unknown  . potassium chloride SA (K-DUR,KLOR-CON) 20 MEQ tablet Take 1 tablet (20 mEq total) by mouth daily. 30 tablet 3 02/11/2018 at Unknown time  . spironolactone (ALDACTONE) 25 MG tablet Take 1 tablet (25 mg total) by mouth every evening. 30 tablet 6 02/10/2018 at Unknown time  . traMADol (ULTRAM) 50 MG tablet Take 50 mg by mouth every 6 (six) hours as needed for moderate pain or severe pain.   01/28/2018 at Unknown time  . warfarin (COUMADIN) 5 MG tablet TAKE 1&1/2 TABLETS (7.5MG ) BY MOUTH DAILY.TAKE AS DIRECTED BY COUMADIN CLINIC. 45 tablet 11 02/10/2018 at 1700    Assessment: 47 YOM on warfarin at home LV mural thrombus here with chest pain now s/p LHC. Pharmacy consulted to resume home warfarin therapy. INR was 2.2 on admission.  INR subtherapeutic today at 1.43, and heparin level therapeutic at upper end 0.69 s/p restart after TR band.  CBC stable and wnl, no bleeding noted at this time.    Home warfarin dose: 7.5 mg daily   Goal of Therapy:  Heparin level 0.3-0.7 INR 2-3 Monitor platelets by anticoagulation protocol: Yes   Plan:  Decrease heparin to 1150 units/hr Give warfarin 7.5mg  x 1 tonight Monitor daily heparin level, CBC, INR and s/s bleeding  Bertis Ruddy, PharmD Pharmacy Resident Pager #: [phone] 02/15/2018 2:11 PM

## 2018-02-16 LAB — BASIC METABOLIC PANEL
Anion gap: 10 (ref 5–15)
BUN: 13 mg/dL (ref 6–20)
CHLORIDE: 103 mmol/L (ref 101–111)
CO2: 26 mmol/L (ref 22–32)
Calcium: 9.1 mg/dL (ref 8.9–10.3)
Creatinine, Ser: 1.08 mg/dL (ref 0.61–1.24)
GFR calc non Af Amer: >60 mL/min (ref >60)
Glucose, Bld: 114 mg/dL — ABNORMAL HIGH (ref 65–99)
POTASSIUM: 3.8 mmol/L (ref 3.5–5.1)
Sodium: 139 mmol/L (ref 135–145)

## 2018-02-16 LAB — CBC
HEMATOCRIT: 40.8 % (ref 39.0–52.0)
HEMOGLOBIN: 13.3 g/dL (ref 13.0–17.0)
MCH: 25.6 pg — ABNORMAL LOW (ref 26.0–34.0)
MCHC: 32.6 g/dL (ref 30.0–36.0)
MCV: 78.6 fL (ref 78.0–100.0)
Platelets: 202 10*3/uL (ref 150–400)
RBC: 5.19 MIL/uL (ref 4.22–5.81)
RDW: 15.8 % — ABNORMAL HIGH (ref 11.5–15.5)
WBC: 6.6 10*3/uL (ref 4.0–10.5)

## 2018-02-16 LAB — GLUCOSE, CAPILLARY
GLUCOSE-CAPILLARY: 170 mg/dL — AB (ref 65–99)
GLUCOSE-CAPILLARY: 190 mg/dL — AB (ref 65–99)
Glucose-Capillary: 111 mg/dL — ABNORMAL HIGH (ref 65–99)
Glucose-Capillary: 122 mg/dL — ABNORMAL HIGH (ref 65–99)
Glucose-Capillary: 276 mg/dL — ABNORMAL HIGH (ref 65–99)

## 2018-02-16 LAB — PROTIME-INR
INR: 1.84
PROTHROMBIN TIME: 21.1 s — AB (ref 11.4–15.2)

## 2018-02-16 LAB — HEPARIN LEVEL (UNFRACTIONATED): HEPARIN UNFRACTIONATED: 0.52 [IU]/mL (ref 0.30–0.70)

## 2018-02-16 MED ORDER — WARFARIN SODIUM 7.5 MG PO TABS
7.5000 mg | ORAL_TABLET | Freq: Once | ORAL | Status: AC
  Administered 2018-02-16: 7.5 mg via ORAL
  Filled 2018-02-16: qty 1

## 2018-02-16 NOTE — Progress Notes (Signed)
Patient ID: Daniel Li, male   DOB: Apr 24, 1956, 62 y.o.   MRN: [id number]  PROGRESS NOTE    Daniel Li  PQZ:[id number] DOB: Jan 19, 1956 DOA: 02/11/2018  PCP: [name], NP   Brief Narrative:  62 y.o. male with a history of CAD (s/p BMS to D1 and OM2 in 2007, BMS to RCA in 02/2012, CTO of LCx by cath in 2014, and most recently anterior STEMI in 01/2017 with DES to Proximal-LAD), chronic systolic CHF (EF 33-54% by echo in 05/2017), ischemic cardiomyopathy (s/p St. 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 Pt presented with NSTEMI.   Assessment & Plan:   Active Problems: NSTEMI (non-ST elevated myocardial infarction) likely type 2 event / Multivessel CAD status post BMS to the first diagonal and second obtuse marginal in 2007, BMS to the RCA in 2013, and DES to proximal LAD in 2018 - Overall stable anatomy  - Some chest pain today - Per cardio, continue to monitor, continue heparin and coumadin - Continue aspirin and plavix - Obtain 12 lead EKG  Chronic systolic heart failure with LVEF 20-25% / St. Jude ICD in place / Ischemic cardiomyopathy  - No recent discharges  - Respiratory status stable  - Continue lasix, digoxin, spironolactone, coreg, losartan  History of LV mural thrombus, on chronic Coumadin - Continue heparin and coumadin   Hyperlipidemia - On Repatha and Zetia - History of statin intolerance due to rhabdomyolysis.    DVT prophylaxis: Hep drip and coumadin Code Status: full code  Family Communication: no family at bedside this am  Disposition Plan: home in am if chest pain improves    Consultants:   Cardiology   Procedures:   Cardiac catheterization 02/14/2018: The proximal LAD is widely patent. 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 He has a total distal PLA branch and a 75% focal proximal PDA. Suspected  this was demand ischemia, type II non-STEMI. He has severe LV dysfunction and does have an ICD in place for primary prevention.    Echocardiogram 02/12/2018: Left ventricle: The cavity size was mildly to moderately dilated. Wall thickness was normal. Systolic function was severely reduced. The estimated ejection fraction was in the range of 20% to 25%. Diffuse hypokinesis. Doppler parameters are consistent with grade 1 diastolic dysfunction. Regional wall motion abnormality:   Antimicrobials:   None    Subjective: No overnight events.  Objective: Vitals:   02/15/18 1942 02/16/18 0017 02/16/18 0532 02/16/18 0743  BP: 112/78 101/67 98/70 97/73   Pulse: 82 66 62 65  Resp: 13 13 10    Temp: 98.8 F (37.1 C)   98 F (36.7 C)  TempSrc: Oral   Oral  SpO2: 98% 96%  96%  Weight:   77.7 kg (171 lb 3.2 oz)   Height:        Intake/Output Summary (Last 24 hours) at 02/16/2018 0847 Last data filed at 02/16/2018 0615 Gross per 24 hour  Intake 1223.73 ml  Output 1400 ml  Net -176.27 ml   Filed Weights   02/14/18 0500 02/15/18 0500 02/16/18 0532  Weight: 78.6 kg (173 lb 4.5 oz) 77.9 kg (171 lb 12.8 oz) 77.7 kg (171 lb 3.2 oz)    Physical Exam  Constitutional: Appears well-developed and well-nourished. No distress.  CVS: RRR, S1/S2 + Pulmonary: Effort and breath sounds normal, no stridor, rhonchi, wheezes, rales.  Abdominal: Soft. BS +,  no  distension, tenderness, rebound or guarding.  Musculoskeletal: Normal range of motion. No edema and no tenderness.  Lymphadenopathy: No lymphadenopathy noted, cervical, inguinal. Neuro: Alert. Normal reflexes, muscle tone coordination. No cranial nerve deficit. Skin: Skin is warm and dry. No rash noted. Not diaphoretic. No erythema. No pallor.  Psychiatric: Normal mood and affect. Behavior, judgment, thought content normal.     Data Reviewed: I have personally reviewed following labs and imaging studies  CBC: Recent Labs  Lab 02/11/18 1538  02/13/18 0948 02/15/18 0508 02/16/18 0505  WBC 8.7 6.9 5.9 6.6  HGB 13.6 13.9 13.9 13.3  HCT 42.4 43.3 41.8 40.8  MCV 80.2 79.9 78.3 78.6  PLT 193 183 205 941   Basic Metabolic Panel: Recent Labs  Lab 02/12/18 0038 02/12/18 0650 02/13/18 0948 02/14/18 0502 02/15/18 0508 02/16/18 0505  NA  --  141 138 139 140 139  K  --  4.1 3.6 4.2 4.1 3.8  CL  --  102 98* 98* 105 103  CO2  --  31 29 31 27 26   GLUCOSE  --  85 218* 114* 113* 114*  BUN  --  9 13 17 13 13   CREATININE  --  1.03 1.04 1.13 1.08 1.08  CALCIUM  --  9.1 9.7 9.7 9.2 9.1  MG 2.2 1.9  --   --   --   --    GFR: Estimated Creatinine Clearance: 70.5 mL/min (by C-G formula based on SCr of 1.08 mg/dL). Liver Function Tests: No results for input(s): AST, ALT, ALKPHOS, BILITOT, PROT, ALBUMIN in the last 168 hours. No results for input(s): LIPASE, AMYLASE in the last 168 hours. No results for input(s): AMMONIA in the last 168 hours. Coagulation Profile: Recent Labs  Lab 02/11/18 1913 02/12/18 0038 02/13/18 0948 02/14/18 0502 02/15/18 0508  INR 2.73 2.74 2.20 1.71 1.43   Cardiac Enzymes: Recent Labs  Lab 02/11/18 1913 02/12/18 0038 02/12/18 0650  TROPONINI 1.11* 1.67* 1.46*   BNP (last 3 results) No results for input(s): PROBNP in the last 8760 hours. HbA1C: No results for input(s): HGBA1C in the last 72 hours. CBG: Recent Labs  Lab 02/15/18 0731 02/15/18 1153 02/15/18 1550 02/15/18 2114 02/16/18 0724  GLUCAP 114* 118* 184* 233* 111*   Lipid Profile: No results for input(s): CHOL, HDL, LDLCALC, TRIG, CHOLHDL, LDLDIRECT in the last 72 hours. Thyroid Function Tests: No results for input(s): TSH, T4TOTAL, FREET4, T3FREE, THYROIDAB in the last 72 hours. Anemia Panel: No results for input(s): VITAMINB12, FOLATE, FERRITIN, TIBC, IRON, RETICCTPCT in the last 72 hours. Urine analysis: No results found for: COLORURINE, APPEARANCEUR, LABSPEC, PHURINE, GLUCOSEU, HGBUR, BILIRUBINUR, KETONESUR, PROTEINUR,  UROBILINOGEN, NITRITE, LEUKOCYTESUR Sepsis Labs: @LABRCNTIP (procalcitonin:4,lacticidven:4)    MRSA PCR Screening     Status: None   Collection Time: 02/11/18  9:42 PM  Result Value Ref Range Status   MRSA by PCR NEGATIVE NEGATIVE Final      Radiology Studies: Dg Chest 2 View Result Date: 02/11/2018 No acute abnormality noted.     Scheduled Meds: . aspirin  81 mg Oral Daily  . carvedilol  3.125 mg Oral BID WC  . clopidogrel  75 mg Oral Daily  . digoxin  0.125 mg Oral Daily  . ezetimibe  10 mg Oral Daily  . furosemide  80 mg Oral QAC breakfast  . furosemide  40 mg Oral q1800  . gabapentin  100 mg Oral BID  . insulin aspart  0-9 Units Subcutaneous TID WC  . losartan  12.5 mg Oral Daily  .  potassium chloride  20 mEq Oral Daily  . spironolactone  25 mg Oral QPM  . Warfarin    Does not apply q1800   Continuous Infusions: . sodium chloride    . heparin 1,150 Units/hr (02/15/18 1757)  . nitroGLYCERIN Stopped (02/14/18 1315)     LOS: 4 days    Time spent: 25 minutes  Greater than 50% of the time spent on counseling and coordinating the care.   Leisa Lenz, MD [facility] Pager [phone]  If 7PM-7AM, please contact night-coverage [email or web address] Password [facility] 02/16/2018, 8:47 AM

## 2018-02-16 NOTE — Progress Notes (Signed)
Progress Note  Patient Name: Daniel Li Date of Encounter: 02/16/2018  Primary Cardiologist: Dr. Carlyle Dolly  Subjective   States that he did not sleep well.  No shortness of breath at rest.  Did have some recurrent chest pain.  No palpitations.  Inpatient Medications    Scheduled Meds: . aspirin  81 mg Oral Daily  . carvedilol  3.125 mg Oral BID WC  . clopidogrel  75 mg Oral Daily  . digoxin  0.125 mg Oral Daily  . Evolocumab  1 pen Subcutaneous Q14 Days  . ezetimibe  10 mg Oral Daily  . furosemide  80 mg Oral QAC breakfast   And  . furosemide  40 mg Oral q1800  . gabapentin  100 mg Oral BID  . insulin aspart  0-9 Units Subcutaneous TID WC  . losartan  12.5 mg Oral Daily  . potassium chloride SA  20 mEq Oral Daily  . sodium chloride flush  3 mL Intravenous Q12H  . spironolactone  25 mg Oral QPM  . Warfarin - Pharmacist Dosing Inpatient   Does not apply q1800   Continuous Infusions: . sodium chloride    . heparin 1,150 Units/hr (02/15/18 1757)  . nitroGLYCERIN Stopped (02/14/18 1315)   PRN Meds: sodium chloride, acetaminophen, ALPRAZolam, morphine injection, ondansetron **OR** ondansetron (ZOFRAN) IV, sodium chloride flush   Vital Signs    Vitals:   02/15/18 1942 02/16/18 0017 02/16/18 0532 02/16/18 0743  BP: 112/78 101/67 98/70 97/73   Pulse: 82 66 62 65  Resp: 13 13 10    Temp: 98.8 F (37.1 C)   98 F (36.7 C)  TempSrc: Oral   Oral  SpO2: 98% 96%  96%  Weight:   171 lb 3.2 oz (77.7 kg)   Height:        Intake/Output Summary (Last 24 hours) at 02/16/2018 0810 Last data filed at 02/16/2018 0615 Gross per 24 hour  Intake 1443.73 ml  Output 1400 ml  Net 43.73 ml   Filed Weights   02/14/18 0500 02/15/18 0500 02/16/18 0532  Weight: 173 lb 4.5 oz (78.6 kg) 171 lb 12.8 oz (77.9 kg) 171 lb 3.2 oz (77.7 kg)    Telemetry    Sinus rhythm.  Personally reviewed.  Physical Exam   GEN: No acute distress.   Neck: No JVD. Cardiac: RRR, no  gallop.  Respiratory: Nonlabored. Clear to auscultation bilaterally. GI: Soft, nontender, bowel sounds present. MS: No edema; No deformity. Neuro:  Nonfocal. Psych: Alert and oriented x 3. Normal affect.  Labs    Chemistry Recent Labs  Lab 02/14/18 0502 02/15/18 0508 02/16/18 0505  NA 139 140 139  K 4.2 4.1 3.8  CL 98* 105 103  CO2 31 27 26   GLUCOSE 114* 113* 114*  BUN 17 13 13   CREATININE 1.13 1.08 1.08  CALCIUM 9.7 9.2 9.1  GFRNONAA >60 >60 >60  GFRAA >60 >60 >60  ANIONGAP 10 8 10      Hematology Recent Labs  Lab 02/13/18 0948 02/15/18 0508 02/16/18 0505  WBC 6.9 5.9 6.6  RBC 5.42 5.34 5.19  HGB 13.9 13.9 13.3  HCT 43.3 41.8 40.8  MCV 79.9 78.3 78.6  MCH 25.6* 26.0 25.6*  MCHC 32.1 33.3 32.6  RDW 16.1* 16.3* 15.8*  PLT 183 205 202    Cardiac Enzymes Recent Labs  Lab 02/11/18 1913 02/12/18 0038 02/12/18 0650  TROPONINI 1.11* 1.67* 1.46*    Recent Labs  Lab 02/11/18 1541  TROPIPOC 0.07  Radiology    No results found.  Cardiac Studies   Cardiac catheterization 02/14/2018:  RPDA lesion is 75% stenosed.  Post Atrio lesion is 100% stenosed.  Ost Cx to Prox Cx lesion is 100% stenosed.  Previously placed Ost LAD to Prox LAD stent (unknown type) is widely patent.  Prox LAD lesion is 50% stenosed.  Previously placed Ost 1st Diag stent (unknown type) is widely patent.  1st Diag lesion is 75% stenosed.  IMPRESSION:Mr. Brendlinger's anatomy is unchanged compared to his previous angiogram which I performed a year ago in setting of an anterior STEMI. This proximal LAD is widely patent. 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 He has a total distal PLA branch and a 75% focal proximal PDA. I did not perform left ventriculography obtain left heart pressures because of a known left ventricular. Eye suspected this was demand ischemia, type II non-STEMI. He has severe LV dysfunction and does have an ICD in place for  primary prevention. Continue medical therapy will be recommended. The sheath was removed and a TR band was placed on the right wrist to achieve patient hemostasis. The patient left the lab in stable condition. He can be re-coumadinized for his mural thrombus.  Echocardiogram 02/12/2018: Study Conclusions  - Left ventricle: The cavity size was mildly to moderately dilated. Wall thickness was normal. Systolic function was severely reduced. The estimated ejection fraction was in the range of 20% to 25%. Diffuse hypokinesis. Doppler parameters are consistent with abnormal left ventricular relaxation (grade 1 diastolic dysfunction). - Regional wall motion abnormality: Akinesis of the basal-mid anteroseptal and apical myocardium. - Aortic valve: Valve area (VTI): 1.87 cm^2. Valve area (Vmax): 1.93 cm^2. Valve area (Vmean): 1.7 cm^2. - Mitral valve: There was mild regurgitation. - Left atrium: The atrium was moderately dilated. - Technically adequate study.  Patient Profile     62 y.o. male with a history of CAD (s/p BMS to D1 and OM2 in 2007, BMS to RCA in 02/2012, CTO of LCx by cath in 2014, and most recently anterior STEMI in 01/2017 with DES to Proximal-LAD), chronic systolic CHF (EF 13-24% by echo in 05/2017), ischemic cardiomyopathy (s/p St. 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  Assessment & Plan    1.  NSTEMI, likely type 2 event.  Coronary anatomy found to be stable at recent cardiac catheterization with plan to continue medical therapy.  He did have recurrent chest pain yesterday.  2.  Multivessel CAD with history of BMS to the first diagonal and second obtuse marginal in 2007, BMS to the RCA in 2013, and DES to the proximal LAD in 2018.  Circumflex is known to be occluded and associated with collaterals.  3.  Chronic systolic heart failure with LVEF  20-25% range.  4.  History of LV mural thrombus on chronic Coumadin.  Most recent echocardiogram does not demonstrate active thrombus.  5.  Mixed hyperlipidemia, on Repatha and Zetia with history of rhabdomyolysis on statins.  6.  St. Jude ICD in place.  No device shocks.  Get a follow-up ECG.  Continue Plavix and Coumadin, currently with heparin bridge although do not test anticipate long-term need for bridge if subtherapeutic at discharge without active thrombus on most recent echocardiogram.  Continue with Coreg, Lanoxin, Cozaar, Zetia, Aldactone, Lasix, and potassium supplements.  Would watch today and if otherwise stable consider discharge next 24  hours.  Patient continues on Coumadin with heparin bridge per pharmacy.  Signed, Rozann Lesches, MD  02/16/2018, 8:10 AM

## 2018-02-16 NOTE — Progress Notes (Signed)
ANTICOAGULATION CONSULT NOTE - Initial Consult  Pharmacy Consult for warfarin/heparin Indication: LV mural thrombus  Allergies  Allergen Reactions  . Pork-Derived Products Other (See Comments)    Does not eat for religious reasons - okay with using IV heparin    Patient Measurements: Height: 5\' 6"  (167.6 cm) Weight: 171 lb 3.2 oz (77.7 kg) IBW/kg (Calculated) : 63.8  Heparin dosing weight: 78 kg   Vital Signs: Temp: 98 F (36.7 C) (04/21 0743) Temp Source: Oral (04/21 0743) BP: 110/80 (04/21 0907) Pulse Rate: 68 (04/21 0907)  Labs: Recent Labs    02/14/18 0502 02/15/18 0508 02/15/18 1249 02/16/18 0505 02/16/18 0947  HGB  --  13.9  --  13.3  --   HCT  --  41.8  --  40.8  --   PLT  --  205  --  202  --   LABPROT 19.9* 17.3*  --   --  21.1*  INR 1.71 1.43  --   --  1.84  HEPARINUNFRC  --  0.52 0.69 0.52  --   CREATININE 1.13 1.08  --  1.08  --     Estimated Creatinine Clearance: 70.5 mL/min (by C-G formula based on SCr of 1.08 mg/dL).   Medical History: Past Medical History:  Diagnosis Date  . AICD (automatic cardioverter/defibrillator) present   . Anxiety   . Arthritis    "all over" (09/17/2017)  . Burn   . CAD (coronary artery disease)    a. NSTEMI s/p BMS to 1st Diagonal and distal OM2 in 2007; b. STEMI 03/26/12 s/p BMS to RCA; c. NSTEMI 10/2012 : CTO of LCx (unable to open with PCI) and PL branch, mod dz of LAD and diagonal, and preserved LV systolic fxn, Med Rx;  d.  anterior STEMI in 01/2017 with DES to Proximal LAD  . Cardiomyopathy EF 35% on cath 06/30/14, new from jan 2015 07/20/2014  . Chest pain   . CHF (congestive heart failure) ([place])   . Chronic back pain    "all over" (09/17/2017)  . DDD (degenerative disc disease), cervical   . Depression   . GERD (gastroesophageal reflux disease)   . Headache    "a few/wk" (09/17/2017)  . HTN (hypertension)   . Hypercholesterolemia   . Mental disorder   . Myocardial infarction ([place])    "I've had 7"  (09/17/2017)  . Pulmonary edema   . Respiratory failure ([place])   . Sciatic pain   . Sleep apnea   . Stroke [facility])    a. multiple dating back to 2002; *I''ve had 5; LUE/LLE weaker since" (09/17/2017)  . Tibia fracture (l) leg  . Tobacco abuse   . Type 2 diabetes mellitus ([place])   . Unstable angina (HCC)     Medications:  Medications Prior to Admission  Medication Sig Dispense Refill Last Dose  . acetaminophen (TYLENOL) 325 MG tablet Take 2 tablets (650 mg total) by mouth every 4 (four) hours as needed for headache or mild pain.   unknown  . carvedilol (COREG) 3.125 MG tablet TAKE (1) TABLET BY MOUTH TWICE A DAY WITH MEALS. 60 tablet 11 02/11/2018 at 800a  . digoxin (LANOXIN) 0.125 MG tablet Take 1 tablet (0.125 mg total) by mouth daily. 30 tablet 3 02/11/2018 at Unknown time  . Evolocumab (REPATHA SURECLICK) 161 MG/ML SOAJ Inject 1 pen into the skin every 14 (fourteen) days. 2 pen 11 01/30/2018 at Unknown time  . ezetimibe (ZETIA) 10 MG tablet Take 1 tablet (10 mg total)  by mouth daily. 30 tablet 3 02/11/2018 at Unknown time  . furosemide (LASIX) 40 MG tablet Take 80 mg (2 tabs) in am and 40 mg (1 tab) in pm (Patient taking differently: Take 40-80 mg by mouth 2 (two) times daily. Take 80 mg (2 tabs) in am and 40 mg (1 tab) in pm. *May take one tablet (in addition) as needed for weight gain greater than 3 pounds) 90 tablet 6 02/11/2018 at Unknown time  . gabapentin (NEURONTIN) 100 MG capsule Take 1 capsule (100 mg total) by mouth 2 (two) times daily. 60 capsule 0 02/11/2018 at Unknown time  . glipiZIDE (GLUCOTROL XL) 10 MG 24 hr tablet Take 1 tablet (10 mg total) by mouth daily. 30 tablet 1 02/11/2018 at Unknown time  . losartan (COZAAR) 25 MG tablet Take 0.5 tablets (12.5 mg total) by mouth daily. 15 tablet 3 02/11/2018 at Unknown time  . metolazone (ZAROXOLYN) 2.5 MG tablet Take 1 tablet (2.5 mg total) by mouth daily as needed. For weight and fluid gain. Only if weight dose not come down with an extra  Lasix. See discharge orders. 30 tablet 11 unknown  . nitroGLYCERIN (NITROSTAT) 0.4 MG SL tablet Place 1 tablet (0.4 mg total) under the tongue every 5 (five) minutes as needed for chest pain. 25 tablet 3 unknown  . potassium chloride SA (K-DUR,KLOR-CON) 20 MEQ tablet Take 1 tablet (20 mEq total) by mouth daily. 30 tablet 3 02/11/2018 at Unknown time  . spironolactone (ALDACTONE) 25 MG tablet Take 1 tablet (25 mg total) by mouth every evening. 30 tablet 6 02/10/2018 at Unknown time  . traMADol (ULTRAM) 50 MG tablet Take 50 mg by mouth every 6 (six) hours as needed for moderate pain or severe pain.   01/28/2018 at Unknown time  . warfarin (COUMADIN) 5 MG tablet TAKE 1&1/2 TABLETS (7.5MG ) BY MOUTH DAILY.TAKE AS DIRECTED BY COUMADIN CLINIC. 45 tablet 11 02/10/2018 at 1700    Assessment: 61 YOM on warfarin at home LV mural thrombus here with chest pain now s/p LHC. Pharmacy consulted to resume home warfarin therapy. INR was 2.2 on admission.  INR subtherapeutic at 1.84, heparin level therapeutic at 0.52.  CBC wnl and no bleeding reported today.  Home warfarin dose: 7.5 mg daily   Goal of Therapy:  Heparin level 0.3-0.7 INR 2-3 Monitor platelets by anticoagulation protocol: Yes   Plan:  Continue heparin gtt at 1150 units/hr Give warfarin 7.5mg  x 1 tonight Monitor daily INR, heparin level, CBC, s/s bleeding  Bertis Ruddy, PharmD Pharmacy Resident Pager #: [phone] 02/16/2018 10:55 AM

## 2018-02-17 ENCOUNTER — Encounter (HOSPITAL_COMMUNITY): Payer: Self-pay | Admitting: Cardiovascular Disease

## 2018-02-17 DIAGNOSIS — R072 Precordial pain: Secondary | ICD-10-CM

## 2018-02-17 DIAGNOSIS — I10 Essential (primary) hypertension: Secondary | ICD-10-CM

## 2018-02-17 DIAGNOSIS — I5022 Chronic systolic (congestive) heart failure: Secondary | ICD-10-CM

## 2018-02-17 LAB — CBC
HCT: 40.7 % (ref 39.0–52.0)
Hemoglobin: 13.4 g/dL (ref 13.0–17.0)
MCH: 25.8 pg — AB (ref 26.0–34.0)
MCHC: 32.9 g/dL (ref 30.0–36.0)
MCV: 78.4 fL (ref 78.0–100.0)
PLATELETS: 195 10*3/uL (ref 150–400)
RBC: 5.19 MIL/uL (ref 4.22–5.81)
RDW: 16.3 % — AB (ref 11.5–15.5)
WBC: 6.4 10*3/uL (ref 4.0–10.5)

## 2018-02-17 LAB — BASIC METABOLIC PANEL
Anion gap: 8 (ref 5–15)
BUN: 9 mg/dL (ref 6–20)
CALCIUM: 8.9 mg/dL (ref 8.9–10.3)
CO2: 25 mmol/L (ref 22–32)
CREATININE: 0.99 mg/dL (ref 0.61–1.24)
Chloride: 104 mmol/L (ref 101–111)
GFR calc Af Amer: >60 mL/min (ref >60)
GLUCOSE: 104 mg/dL — AB (ref 65–99)
Potassium: 3.3 mmol/L — ABNORMAL LOW (ref 3.5–5.1)
SODIUM: 137 mmol/L (ref 135–145)

## 2018-02-17 LAB — PROTIME-INR
INR: 2.31
Prothrombin Time: 25.2 seconds — ABNORMAL HIGH (ref 11.4–15.2)

## 2018-02-17 LAB — GLUCOSE, CAPILLARY: Glucose-Capillary: 113 mg/dL — ABNORMAL HIGH (ref 65–99)

## 2018-02-17 LAB — HEPARIN LEVEL (UNFRACTIONATED): Heparin Unfractionated: 0.68 IU/mL (ref 0.30–0.70)

## 2018-02-17 MED ORDER — WARFARIN SODIUM 7.5 MG PO TABS
7.5000 mg | ORAL_TABLET | Freq: Every day | ORAL | Status: DC
Start: 2018-02-17 — End: 2018-02-17

## 2018-02-17 MED ORDER — CLOPIDOGREL BISULFATE 75 MG PO TABS: 75.0000 mg | ORAL_TABLET | Freq: Every day | ORAL | 3 refills | Status: DC

## 2018-02-17 MED FILL — Heparin Sod (Porcine)-NaCl IV Soln 1000 Unit/500ML-0.9%: INTRAVENOUS | Qty: 1000 | Status: AC

## 2018-02-17 MED FILL — Midazolam HCl Inj 2 MG/2ML (Base Equivalent): INTRAMUSCULAR | Qty: 2 | Status: AC

## 2018-02-17 MED FILL — Fentanyl Citrate Preservative Free (PF) Inj 100 MCG/2ML: INTRAMUSCULAR | Qty: 2 | Status: AC

## 2018-02-17 NOTE — Progress Notes (Signed)
Progress Note  Patient Name: Daniel Li Isadore Date of Encounter: 02/17/2018  Primary Cardiologist: Carlyle Dolly, MD  Subjective   Feeling well this morning. No further chest pain, was able to ambulate with pain.   Inpatient Medications    Scheduled Meds: . carvedilol  3.125 mg Oral BID WC  . clopidogrel  75 mg Oral Daily  . digoxin  0.125 mg Oral Daily  . Evolocumab  1 pen Subcutaneous Q14 Days  . ezetimibe  10 mg Oral Daily  . furosemide  80 mg Oral QAC breakfast   And  . furosemide  40 mg Oral q1800  . gabapentin  100 mg Oral BID  . insulin aspart  0-9 Units Subcutaneous TID WC  . losartan  12.5 mg Oral Daily  . potassium chloride SA  20 mEq Oral Daily  . sodium chloride flush  3 mL Intravenous Q12H  . spironolactone  25 mg Oral QPM  . Warfarin - Pharmacist Dosing Inpatient   Does not apply q1800   Continuous Infusions: . sodium chloride    . heparin 1,150 Units/hr (02/17/18 0336)  . nitroGLYCERIN Stopped (02/14/18 1315)   PRN Meds: sodium chloride, acetaminophen, ALPRAZolam, morphine injection, ondansetron **OR** ondansetron (ZOFRAN) IV, sodium chloride flush   Vital Signs    Vitals:   02/16/18 2005 02/17/18 0429 02/17/18 0432 02/17/18 0731  BP: 106/71 93/71  97/71  Pulse: 85 67  67  Resp: 17 13  15   Temp: 99.2 F (37.3 C) 98.3 F (36.8 C)  98.1 F (36.7 C)  TempSrc: Oral Oral  Oral  SpO2: 98% 98%  98%  Weight:   173 lb 6.4 oz (78.7 kg)   Height:        Intake/Output Summary (Last 24 hours) at 02/17/2018 0813 Last data filed at 02/17/2018 8921 Gross per 24 hour  Intake 968.53 ml  Output 1000 ml  Net -31.47 ml   Filed Weights   02/15/18 0500 02/16/18 0532 02/17/18 0432  Weight: 171 lb 12.8 oz (77.9 kg) 171 lb 3.2 oz (77.7 kg) 173 lb 6.4 oz (78.7 kg)    Telemetry    SR - Personally Reviewed  ECG    SR with old ST changes in inferolateral leads - Personally Reviewed  Physical Exam   General: Well developed, well nourished, male  appearing in no acute distress. Head: Normocephalic, atraumatic.  Neck: Supple without bruits, JVD. Lungs:  Resp regular and unlabored, CTA. Heart: RRR, S1, S2, no S3, S4, or murmur; no rub. Abdomen: Soft, non-tender, non-distended with normoactive bowel sounds. Extremities: No clubbing, cyanosis, edema. Distal pedal pulses are 2+ bilaterally. Neuro: Alert and oriented X 3. Moves all extremities spontaneously. Psych: Normal affect.  Labs    Chemistry Recent Labs  Lab 02/15/18 0508 02/16/18 0505 02/17/18 0503  NA 140 139 137  K 4.1 3.8 3.3*  CL 105 103 104  CO2 27 26 25   GLUCOSE 113* 114* 104*  BUN 13 13 9   CREATININE 1.08 1.08 0.99  CALCIUM 9.2 9.1 8.9  GFRNONAA >60 >60 >60  GFRAA >60 >60 >60  ANIONGAP 8 10 8      Hematology Recent Labs  Lab 02/15/18 0508 02/16/18 0505 02/17/18 0503  WBC 5.9 6.6 6.4  RBC 5.34 5.19 5.19  HGB 13.9 13.3 13.4  HCT 41.8 40.8 40.7  MCV 78.3 78.6 78.4  MCH 26.0 25.6* 25.8*  MCHC 33.3 32.6 32.9  RDW 16.3* 15.8* 16.3*  PLT 205 202 195    Cardiac Enzymes Recent Labs  Lab 02/11/18 1913 02/12/18 0038 02/12/18 0650  TROPONINI 1.11* 1.67* 1.46*    Recent Labs  Lab 02/11/18 1541  TROPIPOC 0.07     BNPNo results for input(s): BNP, PROBNP in the last 168 hours.   DDimer No results for input(s): DDIMER in the last 168 hours.    Radiology    No results found.  Cardiac Studies   Cardiac catheterization 02/14/2018:  RPDA lesion is 75% stenosed.  Post Atrio lesion is 100% stenosed.  Ost Cx to Prox Cx lesion is 100% stenosed.  Previously placed Ost LAD to Prox LAD stent (unknown type) is widely patent.  Prox LAD lesion is 50% stenosed.  Previously placed Ost 1st Diag stent (unknown type) is widely patent.  1st Diag lesion is 75% stenosed.  IMPRESSION:Mr. Maves's anatomy is unchanged compared to his previous angiogram which I performed a year ago in setting of an anterior STEMI. This proximal LAD is widely patent.  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 He has a total distal PLA branch and a 75% focal proximal PDA. I did not perform left ventriculography obtain left heart pressures because of a known left ventricular. Eye suspected this was demand ischemia, type II non-STEMI. He has severe LV dysfunction and does have an ICD in place for primary prevention. Continue medical therapy will be recommended. The sheath was removed and a TR band was placed on the right wrist to achieve patient hemostasis. The patient left the lab in stable condition. He can be re-coumadinized for his mural thrombus.  Echocardiogram 02/12/2018: Study Conclusions  - Left ventricle: The cavity size was mildly to moderately dilated. Wall thickness was normal. Systolic function was severely reduced. The estimated ejection fraction was in the range of 20% to 25%. Diffuse hypokinesis. Doppler parameters are consistent with abnormal left ventricular relaxation (grade 1 diastolic dysfunction). - Regional wall motion abnormality: Akinesis of the basal-mid anteroseptal and apical myocardium. - Aortic valve: Valve area (VTI): 1.87 cm^2. Valve area (Vmax): 1.93 cm^2. Valve area (Vmean): 1.7 cm^2. - Mitral valve: There was mild regurgitation. - Left atrium: The atrium was moderately dilated. - Technically adequate study.  Patient Profile     62 y.o. male with a history ofCAD (s/p BMS to D1 and OM2 in 2007, BMS to RCA in 02/2012, CTO of LCx by cath in 2014, and most recently anterior STEMI in 01/2017 with DES to Proximal-LAD), chronic systolic CHF (EF 46-50% by echo in 05/2017), ischemic cardiomyopathy (s/p St. 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   Assessment & Plan    1. NSTEMI: troponin peaked at 1.67. Underwent cath 4/19 noted above with stable, unchanged  anatomy. Plan to continue with medical therapy. No further chest pain. EKG unchanged from previous.   2. Chronic systolic HF: known EF of 35-46% on previous echos. Volume stable on exam.  -- continue Coreg, Cozaar, Aldactone, lasix with K+ supplement.   3. History of LV thrombus: on coumadin with heparin bridge. INR 2.31 today. Echo this admission with no LV thrombus noted.   4. HL: on Repatha and Zetia with history of rhabdomyolysis on statins.   5. S/p ICD (St. Jude): stable, no device shocks.   6. Hypokalemia: On K+ supplement   Signed, Reino Bellis, NP  02/17/2018, 8:13 AM  Pager # [phone]   For questions or updates, please contact [place] Please consult  [email or web address] for contact info under Cardiology/STEMI.

## 2018-02-17 NOTE — Discharge Summary (Addendum)
Physician Discharge Summary  Art Levan [facility] XHB:[id number] DOB: 1956-01-29 DOA: 02/11/2018  PCP: [name], NP  Admit date: 02/11/2018 Discharge date: 02/17/2018  Recommendations for Outpatient Follow-up:  Continue coumadin 7.5 mg daily, follow up INR in outpatient setting to keep it in goal range 2-3 Follow up with cardiology per scheduled appointment Follow up with PCP In 1-2 weeks to make sure symptoms are stable  Discharge Diagnoses:  Active Problems:   Diabetes mellitus type 2 with complications ([place])   Benign essential HTN   Hypercholesterolemia   Ischemic cardiomyopathy: EF 35% on cath 06/30/14, new from Jan 2015; Confirmed on Echo with Inferior Hypokinesis   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 Pt presented with NSTEMI.   Hospital Course:    Assessment & Plan:   Active Problems: NSTEMI (non-ST elevated myocardial infarction) likely type 2 event / Multivessel CAD status post BMS to the first diagonal and second obtuse marginal in 2007, BMS to the RCA in 2013, and DES to proximal LAD in 2018 - Overall stable anatomy  - Some chest pain today - Per cardio, continue to monitor, continue heparin and coumadin - Continue  plavix   Chronic systolic heart failure with LVEF 20-25% / St.Jude ICD in place / Ischemic  cardiomyopathy  - No recent discharges  - Respiratory status stable  - Continue lasix, digoxin, spironolactone, coreg, losartan  History of LV mural thrombus, on chronic Coumadin - Continue coumadin   Hyperlipidemia - On Repatha and Zetia - History of statin intolerance due to rhabdomyolysis.    DVT prophylaxis: coumadin on discharge  Code Status: full code  Family Communication: no family at bedside this am    Consultants:   Cardiology   Procedures:   Cardiac catheterization 02/14/2018: The proximal LAD is widely patent. 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 He has a total distal PLA branch and a 75% focal proximal PDA. Suspected this was demand ischemia, type II non-STEMI. He has severe LV dysfunction and does have an ICD in place for primary prevention.    Echocardiogram 02/12/2018: Left ventricle: The cavity size was mildly to moderately dilated. Wall thickness was normal. Systolic function was severely reduced. The estimated ejection fraction was in the range of 20% to 25%. Diffuse hypokinesis. Doppler parameters are consistent with grade 1 diastolic dysfunction. Regional wall motion abnormality:   Antimicrobials:   None    Signed:  Leisa Lenz, MD  [facility] 02/17/2018, 10:21 AM  Pager #: [phone]  Time spent in minutes: 30 minutes    Discharge Exam: Vitals:   02/17/18 0955 02/17/18 0956  BP:  108/77  Pulse: 77 75  Resp:    Temp:    SpO2:     Vitals:   02/17/18 0432 02/17/18 0731 02/17/18 0955 02/17/18 0956  BP:  97/71  108/77  Pulse:  67 77 75  Resp:  15    Temp:  98.1 F (36.7 C)    TempSrc:  Oral    SpO2:  98%    Weight: 78.7 kg (173 lb 6.4 oz)     Height:        General: Pt is alert, follows commands appropriately, not in acute distress Cardiovascular: Regular rate and rhythm, S1/S2 + Respiratory: Clear to auscultation bilaterally, no wheezing, no crackles, no  rhonchi Abdominal: Soft, non tender, non distended, bowel sounds +, no guarding Extremities: no edema, no cyanosis, pulses palpable bilaterally DP and PT Neuro: Grossly nonfocal  Discharge Instructions  Discharge Instructions    Call MD for:  persistant nausea and vomiting   Complete by:  As directed    Call MD for:  redness, tenderness, or signs of infection (pain, swelling, redness, odor or green/yellow discharge around incision site)   Complete by:  As directed    Call MD for:  severe uncontrolled pain   Complete by:  As directed    Diet - low sodium heart healthy   Complete by:  As directed    Discharge instructions   Complete by:  As directed    Continue coumadin 7.5 mg daily, follow up INR in outpatient setting to keep it in goal range 2-3 Follow up with cardiology per scheduled appointment Follow up with PCP In 1-2 weeks to make sure symptoms are stable   Increase activity slowly   Complete by:  As directed      Allergies as of 02/17/2018      Reactions   Pork-derived Products Other (See Comments)   Does not eat for religious reasons - okay with using IV heparin      Medication List    TAKE these medications   acetaminophen 325 MG tablet Commonly known as:  TYLENOL Take 2 tablets (650 mg total) by mouth every 4 (four) hours as needed for headache or mild pain.   carvedilol 3.125 MG tablet Commonly known as:  COREG TAKE (1) TABLET BY MOUTH TWICE A DAY WITH MEALS.   clopidogrel 75 MG tablet Commonly known as:  PLAVIX Take 1 tablet (75 mg total) by mouth daily. Start taking on:  02/18/2018   digoxin 0.125 MG tablet Commonly known as:  LANOXIN Take 1 tablet (0.125 mg total) by mouth daily.   Evolocumab 140 MG/ML Soaj Commonly known as:  REPATHA SURECLICK Inject 1 pen into the skin every 14 (fourteen) days.   ezetimibe 10 MG tablet Commonly known as:  ZETIA Take 1 tablet (10 mg total) by mouth daily.   furosemide 40 MG tablet Commonly known as:  LASIX Take  80 mg (2 tabs) in am and 40 mg (1 tab) in pm What changed:    how much to take  how to take this  when to take this  additional instructions   gabapentin 100 MG capsule Commonly known as:  NEURONTIN Take 1 capsule (100 mg total) by mouth 2 (two) times daily.   glipiZIDE 10 MG 24 hr tablet Commonly known as:  GLUCOTROL XL Take 1 tablet (10 mg total) by mouth daily.   losartan 25 MG tablet Commonly known as:  COZAAR Take 0.5 tablets (12.5 mg total) by mouth daily.   metolazone 2.5 MG tablet Commonly known as:  ZAROXOLYN Take 1 tablet (2.5 mg total) by mouth daily as needed. For weight and fluid gain. Only if weight dose not come down with an extra Lasix.  See discharge orders.   nitroGLYCERIN 0.4 MG SL tablet Commonly known as:  NITROSTAT Place 1 tablet (0.4 mg total) under the tongue every 5 (five) minutes as needed for chest pain.   potassium chloride SA 20 MEQ tablet Commonly known as:  K-DUR,KLOR-CON Take 1 tablet (20 mEq total) by mouth daily.   spironolactone 25 MG tablet Commonly known as:  ALDACTONE Take 1 tablet (25 mg total) by mouth every evening.   traMADol 50 MG tablet Commonly known as:  ULTRAM Take 50 mg by mouth every 6 (six) hours as needed for moderate pain or severe pain.   warfarin 5 MG tablet Commonly known as:  COUMADIN Take as directed. If you are unsure how to take this medication, talk to your nurse or doctor. Original instructions:  TAKE 1&1/2 TABLETS (7.5MG ) BY MOUTH DAILY.TAKE AS DIRECTED BY COUMADIN CLINIC.      Follow-up Information    [name], NP. Schedule an appointment as soon as possible for a visit in 1 week(s).   Specialty:  Nurse Practitioner Contact information: [place] [address] [phone]        Arnoldo Lenis, MD .   Specialty:  Cardiology Contact information: [address] [place] [address] [phone]            The results of significant diagnostics from this  hospitalization (including imaging, microbiology, ancillary and laboratory) are listed below for reference.    Significant Diagnostic Studies: Dg Chest 2 View  Result Date: 02/11/2018 CLINICAL DATA:  Chest pain EXAM: CHEST - 2 VIEW COMPARISON:  09/18/2017 FINDINGS: Cardiac shadow is stable. Pacing device is again seen. The lungs are clear bilaterally. No acute bony abnormality is noted. IMPRESSION: No acute abnormality noted. Electronically Signed   By: Inez Catalina M.D.   On: 02/11/2018 16:58    Microbiology: Recent Results (from the past 240 hour(s))  MRSA PCR Screening     Status: None   Collection Time: 02/11/18  9:42 PM  Result Value Ref Range Status   MRSA by PCR NEGATIVE NEGATIVE Final    Comment:        The GeneXpert MRSA Assay (FDA approved for NASAL specimens only), is one component of a comprehensive MRSA colonization surveillance program. It is not intended to diagnose MRSA infection nor to guide or monitor treatment for MRSA infections. Performed at [facility], [address] [place], [place] [address]      Labs: Basic Metabolic Panel: Recent Labs  Lab 02/12/18 0038 02/12/18 0650 02/13/18 0948 02/14/18 0502 02/15/18 0508 02/16/18 0505 02/17/18 0503  NA  --  141 138 139 140 139 137  K  --  4.1 3.6 4.2 4.1 3.8 3.3*  CL  --  102 98* 98* 105 103 104  CO2  --  31 29 31 27 26 25   GLUCOSE  --  85 218* 114* 113* 114* 104*  BUN  --  9 13 17 13 13 9   CREATININE  --  1.03 1.04 1.13 1.08 1.08 0.99  CALCIUM  --  9.1 9.7 9.7 9.2 9.1 8.9  MG 2.2 1.9  --   --   --   --   --    Liver Function Tests: No results for input(s): AST, ALT, ALKPHOS, BILITOT, PROT, ALBUMIN in the last 168 hours. No results for input(s): LIPASE, AMYLASE in the last 168 hours. No results for input(s): AMMONIA in the last 168 hours. CBC: Recent Labs  Lab 02/11/18 1538 02/13/18 7673  02/15/18 0508 02/16/18 0505 02/17/18 0503  WBC 8.7 6.9 5.9 6.6 6.4  HGB 13.6 13.9 13.9 13.3 13.4  HCT  42.4 43.3 41.8 40.8 40.7  MCV 80.2 79.9 78.3 78.6 78.4  PLT 193 183 205 202 195   Cardiac Enzymes: Recent Labs  Lab 02/11/18 1913 02/12/18 0038 02/12/18 0650  TROPONINI 1.11* 1.67* 1.46*   BNP: BNP (last 3 results) Recent Labs    02/21/17 1302 06/07/17 0425  BNP 1,119.4* 956.1*    ProBNP (last 3 results) No results for input(s): PROBNP in the last 8760 hours.  CBG: Recent Labs  Lab 02/16/18 1133 02/16/18 1802 02/16/18 1804 02/16/18 2126 02/17/18 0729  GLUCAP 122* 170* 190* 276* 113*

## 2018-02-17 NOTE — Plan of Care (Signed)
  Problem: Activity: Goal: Risk for activity intolerance will decrease Outcome: Progressing Pt ambulated to bathroom and around the room with no problems

## 2018-02-27 ENCOUNTER — Ambulatory Visit (HOSPITAL_COMMUNITY)
Admission: RE | Admit: 2018-02-27 | Discharge: 2018-02-27 | Disposition: A | Discharge status: Home / Self Care | Payer: Medicaid Other | Source: Ambulatory Visit | Attending: Cardiovascular Disease | Admitting: Cardiovascular Disease

## 2018-02-27 DIAGNOSIS — I10 Essential (primary) hypertension: Secondary | ICD-10-CM | POA: Diagnosis not present

## 2018-02-27 DIAGNOSIS — Z87891 Personal history of nicotine dependence: Secondary | ICD-10-CM | POA: Insufficient documentation

## 2018-02-27 DIAGNOSIS — Z8673 Personal history of transient ischemic attack (TIA), and cerebral infarction without residual deficits: Secondary | ICD-10-CM | POA: Insufficient documentation

## 2018-02-27 DIAGNOSIS — E785 Hyperlipidemia, unspecified: Secondary | ICD-10-CM | POA: Diagnosis not present

## 2018-02-27 DIAGNOSIS — I739 Peripheral vascular disease, unspecified: Secondary | ICD-10-CM | POA: Diagnosis present

## 2018-02-27 DIAGNOSIS — I2510 Atherosclerotic heart disease of native coronary artery without angina pectoris: Secondary | ICD-10-CM | POA: Insufficient documentation

## 2018-02-27 DIAGNOSIS — R9389 Abnormal findings on diagnostic imaging of other specified body structures: Secondary | ICD-10-CM | POA: Diagnosis not present

## 2018-02-27 DIAGNOSIS — E1151 Type 2 diabetes mellitus with diabetic peripheral angiopathy without gangrene: Secondary | ICD-10-CM | POA: Insufficient documentation

## 2018-02-28 ENCOUNTER — Inpatient Hospital Stay (HOSPITAL_COMMUNITY): Admission: RE | Admit: 2018-02-28 | Payer: Medicaid Other | Source: Ambulatory Visit

## 2018-03-05 ENCOUNTER — Encounter: Payer: Self-pay | Admitting: Cardiology

## 2018-03-05 ENCOUNTER — Other Ambulatory Visit (HOSPITAL_COMMUNITY)
Admission: RE | Admit: 2018-03-05 | Discharge: 2018-03-05 | Disposition: A | Discharge status: Home / Self Care | Payer: Medicaid Other | Source: Ambulatory Visit | Attending: Cardiology | Admitting: Cardiology

## 2018-03-05 ENCOUNTER — Ambulatory Visit (INDEPENDENT_AMBULATORY_CARE_PROVIDER_SITE_OTHER): Payer: Medicaid Other | Admitting: Cardiology

## 2018-03-05 VITALS — BP 90/62 | HR 84 | Ht 66.0 in | Wt 177.0 lb

## 2018-03-05 DIAGNOSIS — E7800 Pure hypercholesterolemia, unspecified: Secondary | ICD-10-CM | POA: Diagnosis not present

## 2018-03-05 DIAGNOSIS — Z9861 Coronary angioplasty status: Secondary | ICD-10-CM

## 2018-03-05 DIAGNOSIS — Z95810 Presence of automatic (implantable) cardiac defibrillator: Secondary | ICD-10-CM | POA: Diagnosis not present

## 2018-03-05 DIAGNOSIS — I255 Ischemic cardiomyopathy: Secondary | ICD-10-CM | POA: Diagnosis not present

## 2018-03-05 DIAGNOSIS — Z7901 Long term (current) use of anticoagulants: Secondary | ICD-10-CM | POA: Diagnosis not present

## 2018-03-05 DIAGNOSIS — I2129 ST elevation (STEMI) myocardial infarction involving other sites: Secondary | ICD-10-CM | POA: Diagnosis not present

## 2018-03-05 DIAGNOSIS — E118 Type 2 diabetes mellitus with unspecified complications: Secondary | ICD-10-CM

## 2018-03-05 DIAGNOSIS — I2510 Atherosclerotic heart disease of native coronary artery without angina pectoris: Secondary | ICD-10-CM | POA: Diagnosis not present

## 2018-03-05 DIAGNOSIS — Z79899 Other long term (current) drug therapy: Secondary | ICD-10-CM | POA: Insufficient documentation

## 2018-03-05 DIAGNOSIS — I214 Non-ST elevation (NSTEMI) myocardial infarction: Secondary | ICD-10-CM

## 2018-03-05 LAB — BASIC METABOLIC PANEL
Anion gap: 7 (ref 5–15)
BUN: 15 mg/dL (ref 6–20)
CO2: 31 mmol/L (ref 22–32)
Calcium: 9.5 mg/dL (ref 8.9–10.3)
Chloride: 101 mmol/L (ref 101–111)
Creatinine, Ser: 1.23 mg/dL (ref 0.61–1.24)
GFR calc Af Amer: >60 mL/min (ref >60)
GFR calc non Af Amer: >60 mL/min (ref >60)
Glucose, Bld: 83 mg/dL (ref 65–99)
Potassium: 4.2 mmol/L (ref 3.5–5.1)
Sodium: 139 mmol/L (ref 135–145)

## 2018-03-05 MED ORDER — AMLODIPINE BESYLATE 5 MG PO TABS: 5.0000 mg | ORAL_TABLET | Freq: Every day | ORAL | 3 refills | Status: DC

## 2018-03-05 NOTE — Patient Instructions (Signed)
Medication Instructions:  Your physician recommends that you continue on your current medications as directed. Please refer to the Current Medication list given to you today.   Labwork: Your physician recommends that you return for lab work today.    Testing/Procedures: NONE   Follow-Up: Your physician recommends that you schedule a follow-up appointment as planned.    Any Other Special Instructions Will Be Listed Below (If Applicable).     If you need a refill on your cardiac medications before your next appointment, please call your pharmacy.  Thank you for choosing [facility]!

## 2018-03-05 NOTE — Progress Notes (Signed)
03/05/2018 Daniel Li   23-May-1956  [id number]  Primary Physician [name], NP Primary Cardiologist: Dr Harl Bowie- Dr Clair Gulling  HPI:  62 y.o. male with a history ofCAD (s/p BMS to D1 and OM2 in 2007, BMS to RCA in 02/2012, CTO of LCx by cath in 2014, and most recently anterior STEMI in 01/2017 with DES to Proximal-LAD), chronic systolic CHF (EF 33-82% by echo in 05/2017), ischemic cardiomyopathy (s/p St. Jude ICD placement in 08/2017), history of LV thrombus (on Coumadin), moderate to severe MR, HTN, HLD, Type 2 DM, prior CVA's and OSAwho presented 02/11/18 with a NSTEMI. Cath done 02/11/18 showed patent LAD and Dx1 stents with distal RCA occlusion with L-R collaterals, high grade pCFX with L-L collaterals, 75% mLAD, 80% dDx1. The plan is for medical Rx. He is in the office today for a post hospital follow up.   The pt is a resident of an assorted living facility. He has had prior CVAs. They arrange his medications. Several of his medications were adjusted at discharge 02/17/18. Since discharge the pt says he has not had recurrent chest pain like he had on admission 4/22.      Current Outpatient Medications  Medication Sig Dispense Refill  . carvedilol (COREG) 3.125 MG tablet TAKE (1) TABLET BY MOUTH TWICE A DAY WITH MEALS. 60 tablet 11  . clopidogrel (PLAVIX) 75 MG tablet Take 1 tablet (75 mg total) by mouth daily. 30 tablet 3  . digoxin (LANOXIN) 0.125 MG tablet Take 1 tablet (0.125 mg total) by mouth daily. 30 tablet 3  . ezetimibe (ZETIA) 10 MG tablet Take 1 tablet (10 mg total) by mouth daily. 30 tablet 3  . furosemide (LASIX) 40 MG tablet Take 80 mg (2 tabs) in am and 40 mg (1 tab) in pm (Patient taking differently: Take 40-80 mg by mouth 2 (two) times daily. Take 80 mg (2 tabs) in am and 40 mg (1 tab) in pm. *May take one tablet (in addition) as needed for weight gain greater than 3 pounds) 90 tablet 6  . gabapentin (NEURONTIN) 100 MG capsule Take 1 capsule (100 mg total)  by mouth 2 (two) times daily. 60 capsule 0  . glipiZIDE (GLUCOTROL XL) 10 MG 24 hr tablet Take 1 tablet (10 mg total) by mouth daily. 30 tablet 1  . losartan (COZAAR) 25 MG tablet Take 0.5 tablets (12.5 mg total) by mouth daily. 15 tablet 3  . potassium chloride SA (K-DUR,KLOR-CON) 20 MEQ tablet Take 1 tablet (20 mEq total) by mouth daily. 30 tablet 3  . spironolactone (ALDACTONE) 25 MG tablet Take 1 tablet (25 mg total) by mouth every evening. 30 tablet 6  . warfarin (COUMADIN) 5 MG tablet TAKE 1&1/2 TABLETS (7.5MG ) BY MOUTH DAILY.TAKE AS DIRECTED BY COUMADIN CLINIC. 45 tablet 11  . Evolocumab (REPATHA SURECLICK) 505 MG/ML SOAJ Inject 1 pen into the skin every 14 (fourteen) days. 2 pen 11  . nitroGLYCERIN (NITROSTAT) 0.4 MG SL tablet Place 1 tablet (0.4 mg total) under the tongue every 5 (five) minutes as needed for chest pain. 25 tablet 3   No current facility-administered medications for this visit.     Allergies  Allergen Reactions  . Pork-Derived Products Other (See Comments)    Does not eat for religious reasons - okay with using IV heparin    Past Medical History:  Diagnosis Date  . AICD (automatic cardioverter/defibrillator) present   . Anxiety   . Arthritis    "all over" (09/17/2017)  .  Burn   . CAD (coronary artery disease)    a. NSTEMI s/p BMS to 1st Diagonal and distal OM2 in 2007; b. STEMI 03/26/12 s/p BMS to RCA; c. NSTEMI 10/2012 : CTO of LCx (unable to open with PCI) and PL branch, mod dz of LAD and diagonal, and preserved LV systolic fxn, Med Rx;  d.  anterior STEMI in 01/2017 with DES to Proximal LAD  . Cardiomyopathy EF 35% on cath 06/30/14, new from jan 2015 07/20/2014  . Chest pain   . CHF (congestive heart failure) ([place])   . Chronic back pain    "all over" (09/17/2017)  . DDD (degenerative disc disease), cervical   . Depression   . GERD (gastroesophageal reflux disease)   . Headache    "a few/wk" (09/17/2017)  . HTN (hypertension)   . Hypercholesterolemia   .  Mental disorder   . Myocardial infarction ([place])    "I've had 7" (09/17/2017)  . Pulmonary edema   . Respiratory failure ([place])   . Sciatic pain   . Sleep apnea   . Stroke [facility])    a. multiple dating back to 2002; *I''ve had 5; LUE/LLE weaker since" (09/17/2017)  . Tibia fracture (l) leg  . Tobacco abuse   . Type 2 diabetes mellitus ([place])   . Unstable angina [facility])     Social History   Socioeconomic History  . Marital status: Single    Spouse name: Not on file  . Number of children: 1  . Years of education: Not on file  . Highest education level: Not on file  Occupational History  . Not on file  Social Needs  . Financial resource strain: Not on file  . Food insecurity:    Worry: Not on file    Inability: Not on file  . Transportation needs:    Medical: Not on file    Non-medical: Not on file  Tobacco Use  . Smoking status: Former Smoker    Packs/day: 0.50    Years: 20.00    Pack years: 10.00    Types: Cigarettes, Cigars    Start date: 07/21/1977    Last attempt to quit: 10/15/2017    Years since quitting: 0.3  . Smokeless tobacco: Never Used  Substance and Sexual Activity  . Alcohol use: Yes    Comment: 09/17/2017 "nothing since 05/2017"  . Drug use: No    Comment: previously incarcerated for drug related offense.  [name] Kitchen Sexual activity: Not Currently  Lifestyle  . Physical activity:    Days per week: Not on file    Minutes per session: Not on file  . Stress: Not on file  Relationships  . Social connections:    Talks on phone: Not on file    Gets together: Not on file    Attends religious service: Not on file    Active member of club or organization: Not on file    Attends meetings of clubs or organizations: Not on file    Relationship status: Not on file  . Intimate partner violence:    Fear of current or ex partner: Not on file    Emotionally abused: Not on file    Physically abused: Not on file    Forced sexual activity: Not on file  Other Topics Concern    . Not on file  Social History Narrative  . Not on file     Family History  Problem Relation Age of Onset  . Stroke Mother   . Heart  attack Mother   . Heart attack Father   . Stroke Sister   . Heart attack Sister   . Heart attack Brother   . Stroke Brother   . Liver disease Neg Hx   . Colon cancer Neg Hx      Review of Systems: General: negative for chills, fever, night sweats or weight changes.  Cardiovascular: negative for chest pain, dyspnea on exertion, edema, orthopnea, palpitations, paroxysmal nocturnal dyspnea or shortness of breath Dermatological: negative for rash Respiratory: negative for cough or wheezing Urologic: negative for hematuria Abdominal: negative for nausea, vomiting, diarrhea, bright red blood per rectum, melena, or hematemesis Neurologic: negative for visual changes, syncope, or dizziness All other systems reviewed and are otherwise negative except as noted above.    Blood pressure 90/62, pulse 84, height 5\' 6"  (1.676 m), weight 177 lb (80.3 kg), SpO2 95 %.  General appearance: alert, cooperative and no distress Lungs: crackles Rt base Heart: regular rate and rhythm Extremities: no edema Skin: Skin color, texture, turgor normal. No rashes or lesions Neurologic: Grossly normal   ASSESSMENT AND PLAN:   NSTEMI (non-ST elevated myocardial infarction) [facility]) Seen today as a post hospital f/u after NSTEMI 02/11/18  CAD S/P percutaneous coronary angioplasty STEMI 03/26/2012 with BMS to RCA NSTEMI S/p PCI 2007 NSTEMI 11/25/12 total occlusion of the LCX not able to open with PCI. NSTEMI 02/11/18- cath- medical Rx   Ischemic cardiomyopathy EF 20-25% by echo 02/19/18  LV (left ventricular) mural thrombus with acute MI [facility]) Chronic anticoagulation  ICD (implantable cardioverter-defibrillator) in place S/P St Jude ICD Nov 2018  Hypercholesterolemia Stain intolerant with h/o rhabdomyolysis Pt is on Repatha and Zetia  Anticoagulated on  Coumadin Followed in our Coumadin clinic  Diabetes mellitus type 2 with complications ([place]) On oral agents   PLAN  He is not on any antianginal medication but he is not having chest pain. I considered adding Amlodipine but his B/P runs low. If he has chest pain we could consider Ranexa, I believe he had headaches with Imdur previously. I'll check a BMP today. He has f/u scheduled for July.   Kerin Ransom PA-C 03/05/2018 2:06 PM

## 2018-03-05 NOTE — Assessment & Plan Note (Signed)
On oral agents 

## 2018-03-05 NOTE — Assessment & Plan Note (Signed)
Followed in our Coumadin clinic

## 2018-03-05 NOTE — Assessment & Plan Note (Signed)
Seen today as a post hospital f/u after NSTEMI 02/11/18

## 2018-03-05 NOTE — Assessment & Plan Note (Signed)
S/P St Jude ICD Nov 2018

## 2018-03-05 NOTE — Assessment & Plan Note (Signed)
EF 20-25% by echo 02/19/18

## 2018-03-05 NOTE — Assessment & Plan Note (Signed)
Chronic anticoagulation 

## 2018-03-05 NOTE — Assessment & Plan Note (Addendum)
Stain intolerant with h/o rhabdomyolysis Pt is on Repatha and Zetia

## 2018-03-05 NOTE — Assessment & Plan Note (Signed)
STEMI 03/26/2012 with BMS to RCA NSTEMI S/p PCI 2007 NSTEMI 11/25/12 total occlusion of the LCX not able to open with PCI. NSTEMI 02/11/18- cath- medical Rx

## 2018-03-06 ENCOUNTER — Ambulatory Visit (INDEPENDENT_AMBULATORY_CARE_PROVIDER_SITE_OTHER): Payer: Medicaid Other | Admitting: *Deleted

## 2018-03-06 DIAGNOSIS — Z5181 Encounter for therapeutic drug level monitoring: Secondary | ICD-10-CM

## 2018-03-06 DIAGNOSIS — I2129 ST elevation (STEMI) myocardial infarction involving other sites: Secondary | ICD-10-CM | POA: Diagnosis not present

## 2018-03-06 LAB — POCT INR: INR: 3.3

## 2018-03-06 NOTE — Patient Instructions (Signed)
Take coumadin 1/2 tablet tonight then resume 1 1/2 tablets daily  Recheck in 4 weeks 

## 2018-03-07 NOTE — Telephone Encounter (Signed)
Spoke with patient and explained Dr Rayann Heman referred him to [facility] for monthly follow up.  He agreed and 1st ICM remote transmission scheduled for 03/17/2018.  He said he feels fine now. He has extra Furosemide he can take if needed per prescription from Dr. Aundra Dubin.  Weight today 173 lbs.  He lives at assisted living facility.  Provided ICM number and encouraged to call for fluid symptoms.

## 2018-03-17 ENCOUNTER — Ambulatory Visit (INDEPENDENT_AMBULATORY_CARE_PROVIDER_SITE_OTHER): Payer: Medicaid Other

## 2018-03-17 ENCOUNTER — Telehealth: Payer: Self-pay

## 2018-03-17 DIAGNOSIS — I5042 Chronic combined systolic (congestive) and diastolic (congestive) heart failure: Secondary | ICD-10-CM

## 2018-03-17 DIAGNOSIS — Z95810 Presence of automatic (implantable) cardiac defibrillator: Secondary | ICD-10-CM | POA: Diagnosis not present

## 2018-03-17 NOTE — Telephone Encounter (Signed)
Attempted ICM call to patient to request a remote transmission be sent today.  No answer or answering machine. Patient lives in assisted living facility.

## 2018-03-18 NOTE — Progress Notes (Signed)
EPIC Encounter for ICM Monitoring  Patient Name: Daniel Li is a 62 y.o. male Date: 03/18/2018 Primary Care Physican: [name], NP Primary Cardiologist: [facility] Electrophysiologist: Allred Dry Weight: 171.8 lbs        1st ICM remote. Heart Failure questions reviewed, pt occasionally has shortness of breath that comes and goes.  Advised SOB can be related to fluid accumulation and to call if it worsens. Lives in assisted living   Thoracic impedance normal but was abnormal suggesting fluid accumulation from 01/27/2018 - 02/13/2018, 03/03/2018 - 03/09/2018 and 03/12/2018 - 03/15/2018.  Prescribed dosage: Furosemide 40 mg Take 40-80 mg by mouth 2 (two) times daily. Take 80 mg (2 tabs) in am and 40 mg (1 tab) in pm. May take one tablet (in addition) as needed for weight gain greater than 3 pounds  Recommendations: No changes.  Reinforced fluid restriction to < 2 L daily and sodium restriction to less than 2000 mg daily.  Encouraged to call for fluid symptoms.  Follow-up plan: ICM clinic phone appointment on 04/17/2018.    Copy of ICM check sent to Dr. Rayann Heman.   3 month ICM trend: 03/17/2018    1 Year ICM trend:       Rosalene Billings, RN 03/18/2018 10:46 AM

## 2018-03-31 ENCOUNTER — Emergency Department (HOSPITAL_COMMUNITY)
Admission: EM | Admit: 2018-03-31 | Discharge: 2018-03-31 | Disposition: A | Discharge status: Home / Self Care | Payer: Medicaid Other | Attending: Emergency Medicine | Admitting: Emergency Medicine

## 2018-03-31 ENCOUNTER — Encounter (HOSPITAL_COMMUNITY): Payer: Self-pay

## 2018-03-31 ENCOUNTER — Emergency Department (HOSPITAL_COMMUNITY): Payer: Medicaid Other

## 2018-03-31 ENCOUNTER — Other Ambulatory Visit: Payer: Self-pay

## 2018-03-31 DIAGNOSIS — Z87891 Personal history of nicotine dependence: Secondary | ICD-10-CM | POA: Diagnosis not present

## 2018-03-31 DIAGNOSIS — R002 Palpitations: Secondary | ICD-10-CM | POA: Diagnosis present

## 2018-03-31 DIAGNOSIS — Z95810 Presence of automatic (implantable) cardiac defibrillator: Secondary | ICD-10-CM | POA: Diagnosis not present

## 2018-03-31 DIAGNOSIS — R05 Cough: Secondary | ICD-10-CM | POA: Insufficient documentation

## 2018-03-31 DIAGNOSIS — Z79899 Other long term (current) drug therapy: Secondary | ICD-10-CM | POA: Diagnosis not present

## 2018-03-31 DIAGNOSIS — I110 Hypertensive heart disease with heart failure: Secondary | ICD-10-CM | POA: Insufficient documentation

## 2018-03-31 DIAGNOSIS — J40 Bronchitis, not specified as acute or chronic: Secondary | ICD-10-CM | POA: Insufficient documentation

## 2018-03-31 DIAGNOSIS — R0781 Pleurodynia: Secondary | ICD-10-CM

## 2018-03-31 DIAGNOSIS — Z7984 Long term (current) use of oral hypoglycemic drugs: Secondary | ICD-10-CM | POA: Diagnosis not present

## 2018-03-31 DIAGNOSIS — I5042 Chronic combined systolic (congestive) and diastolic (congestive) heart failure: Secondary | ICD-10-CM | POA: Insufficient documentation

## 2018-03-31 DIAGNOSIS — Z7902 Long term (current) use of antithrombotics/antiplatelets: Secondary | ICD-10-CM | POA: Insufficient documentation

## 2018-03-31 DIAGNOSIS — R0789 Other chest pain: Secondary | ICD-10-CM | POA: Diagnosis not present

## 2018-03-31 DIAGNOSIS — E119 Type 2 diabetes mellitus without complications: Secondary | ICD-10-CM | POA: Insufficient documentation

## 2018-03-31 LAB — CBC WITH DIFFERENTIAL/PLATELET
Basophils Absolute: 0 10*3/uL (ref 0.0–0.1)
Basophils Relative: 0 %
Eosinophils Absolute: 0 10*3/uL (ref 0.0–0.7)
Eosinophils Relative: 1 %
HEMATOCRIT: 41.7 % (ref 39.0–52.0)
HEMOGLOBIN: 13.6 g/dL (ref 13.0–17.0)
LYMPHS PCT: 39 %
Lymphs Abs: 2.1 10*3/uL (ref 0.7–4.0)
MCH: 25.9 pg — ABNORMAL LOW (ref 26.0–34.0)
MCHC: 32.6 g/dL (ref 30.0–36.0)
MCV: 79.3 fL (ref 78.0–100.0)
MONOS PCT: 6 %
Monocytes Absolute: 0.3 10*3/uL (ref 0.1–1.0)
NEUTROS ABS: 2.9 10*3/uL (ref 1.7–7.7)
NEUTROS PCT: 54 %
Platelets: 197 10*3/uL (ref 150–400)
RBC: 5.26 MIL/uL (ref 4.22–5.81)
RDW: 15.9 % — ABNORMAL HIGH (ref 11.5–15.5)
WBC: 5.4 10*3/uL (ref 4.0–10.5)

## 2018-03-31 LAB — COMPREHENSIVE METABOLIC PANEL
ALK PHOS: 53 U/L (ref 38–126)
ALT: 20 U/L (ref 17–63)
ANION GAP: 8 (ref 5–15)
AST: 26 U/L (ref 15–41)
Albumin: 3.9 g/dL (ref 3.5–5.0)
BILIRUBIN TOTAL: 0.7 mg/dL (ref 0.3–1.2)
BUN: 14 mg/dL (ref 6–20)
CALCIUM: 9.4 mg/dL (ref 8.9–10.3)
CO2: 29 mmol/L (ref 22–32)
Chloride: 102 mmol/L (ref 101–111)
Creatinine, Ser: 1.09 mg/dL (ref 0.61–1.24)
GFR calc Af Amer: >60 mL/min (ref >60)
GFR calc non Af Amer: >60 mL/min (ref >60)
GLUCOSE: 127 mg/dL — AB (ref 65–99)
Potassium: 3.8 mmol/L (ref 3.5–5.1)
Sodium: 139 mmol/L (ref 135–145)
TOTAL PROTEIN: 7 g/dL (ref 6.5–8.1)

## 2018-03-31 LAB — TROPONIN I
Troponin I: 0.03 ng/mL (ref <0.03)
Troponin I: <0.03 ng/mL (ref <0.03)

## 2018-03-31 LAB — PROTIME-INR
INR: 2.26
PROTHROMBIN TIME: 24.8 s — AB (ref 11.4–15.2)

## 2018-03-31 LAB — DIGOXIN LEVEL: Digoxin Level: 0.3 ng/mL — ABNORMAL LOW (ref 0.8–2.0)

## 2018-03-31 LAB — BRAIN NATRIURETIC PEPTIDE: B NATRIURETIC PEPTIDE 5: 106 pg/mL — AB (ref 0.0–100.0)

## 2018-03-31 MED ORDER — LEVOFLOXACIN 500 MG PO TABS: 500.0000 mg | ORAL_TABLET | Freq: Every day | ORAL | 0 refills | Status: DC

## 2018-03-31 MED ORDER — SODIUM CHLORIDE 0.9 % IV BOLUS
500.0000 mL | Freq: Once | INTRAVENOUS | Status: AC
  Administered 2018-03-31: 500 mL via INTRAVENOUS

## 2018-03-31 MED ORDER — LEVOFLOXACIN 500 MG PO TABS
500.0000 mg | ORAL_TABLET | Freq: Once | ORAL | Status: AC
  Administered 2018-03-31: 500 mg via ORAL
  Filled 2018-03-31: qty 1

## 2018-03-31 MED ORDER — BENZONATATE 100 MG PO CAPS: 100.0000 mg | ORAL_CAPSULE | Freq: Three times a day (TID) | ORAL | 0 refills | Status: DC | PRN

## 2018-03-31 NOTE — ED Notes (Signed)
ICD interrogated with [place] interrogator.  Successful transmission.
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
## 2018-03-31 NOTE — ED Notes (Signed)
Ordered meal tray for patient

## 2018-03-31 NOTE — ED Triage Notes (Signed)
Pt reports he has a defibrillator and woke up this morning with the alarm going off and says he felt a little tingle in his chest while the monitor lit up.  Pt denies any symptoms other than cold symptoms.

## 2018-03-31 NOTE — ED Notes (Signed)
Gave patient meal tray.

## 2018-03-31 NOTE — ED Notes (Signed)
Called report to Ms Kellie Simmering at [facility]. She verbalized understanding and states she will have to pick patient up but it will be 1-2 hours before she can get here.

## 2018-03-31 NOTE — ED Notes (Signed)
Date and time results received: 03/31/18 9:51 AM (use smartphrase ".now" to insert current time)  Test: Troponin Critical Value: 0.03  Name of Provider Notified: Lita Mains  Orders Received? Or Actions Taken?: Orders Received - See Orders for details

## 2018-04-03 ENCOUNTER — Ambulatory Visit (INDEPENDENT_AMBULATORY_CARE_PROVIDER_SITE_OTHER): Payer: Medicaid Other | Admitting: *Deleted

## 2018-04-03 DIAGNOSIS — I255 Ischemic cardiomyopathy: Secondary | ICD-10-CM

## 2018-04-03 DIAGNOSIS — I2129 ST elevation (STEMI) myocardial infarction involving other sites: Secondary | ICD-10-CM | POA: Diagnosis not present

## 2018-04-03 DIAGNOSIS — Z5181 Encounter for therapeutic drug level monitoring: Secondary | ICD-10-CM

## 2018-04-03 LAB — POCT INR: INR: 3 (ref 2.0–3.0)

## 2018-04-03 NOTE — Patient Instructions (Signed)
Take coumadin 5mg  daily except 7.5mg  on Saturday thru 04/07/18.  On 6/11 restart coumadin 7.5mg  daily.   Recheck 04/15/18

## 2018-04-04 ENCOUNTER — Telehealth (HOSPITAL_COMMUNITY): Payer: Self-pay | Admitting: *Deleted

## 2018-04-04 NOTE — Telephone Encounter (Signed)
Latoya with [facility] left a VM stating patients hands were swollen and he is c/o shortness or breath. Called back to get more information (weight, vitals, current meds, any other complaints). I attempted to reach them several times and only received a busy signal. Attempted to call patient directly but the only number he has listed is the facility number. Looked online for an alternative number to [place] and they only have the one number that ive been calling listed.

## 2018-04-15 ENCOUNTER — Ambulatory Visit (INDEPENDENT_AMBULATORY_CARE_PROVIDER_SITE_OTHER): Payer: Medicaid Other | Admitting: *Deleted

## 2018-04-15 DIAGNOSIS — Z5181 Encounter for therapeutic drug level monitoring: Secondary | ICD-10-CM

## 2018-04-15 DIAGNOSIS — I2129 ST elevation (STEMI) myocardial infarction involving other sites: Secondary | ICD-10-CM | POA: Diagnosis not present

## 2018-04-15 LAB — POCT INR: INR: 4.1 — AB (ref 2.0–3.0)

## 2018-04-15 MED ORDER — WARFARIN SODIUM 5 MG PO TABS: ORAL_TABLET | ORAL | 11 refills | Status: DC

## 2018-04-15 NOTE — Patient Instructions (Signed)
Hold coumadin tonight then decrease dose to 7.5mg  daily except 5mg  on Sundays and Wednesdays.   Recheck 05/06/18 New Rx for warfarin sent to [facility]

## 2018-04-16 ENCOUNTER — Ambulatory Visit (HOSPITAL_COMMUNITY)
Admission: RE | Admit: 2018-04-16 | Discharge: 2018-04-16 | Disposition: A | Discharge status: Home / Self Care | Payer: Medicaid Other | Source: Ambulatory Visit | Attending: Cardiology | Admitting: Cardiology

## 2018-04-16 VITALS — BP 110/62 | HR 86 | Wt 179.0 lb

## 2018-04-16 DIAGNOSIS — Z7984 Long term (current) use of oral hypoglycemic drugs: Secondary | ICD-10-CM | POA: Diagnosis not present

## 2018-04-16 DIAGNOSIS — M79602 Pain in left arm: Secondary | ICD-10-CM | POA: Diagnosis not present

## 2018-04-16 DIAGNOSIS — Z7901 Long term (current) use of anticoagulants: Secondary | ICD-10-CM | POA: Diagnosis not present

## 2018-04-16 DIAGNOSIS — M6282 Rhabdomyolysis: Secondary | ICD-10-CM | POA: Insufficient documentation

## 2018-04-16 DIAGNOSIS — Z7902 Long term (current) use of antithrombotics/antiplatelets: Secondary | ICD-10-CM | POA: Diagnosis not present

## 2018-04-16 DIAGNOSIS — I5022 Chronic systolic (congestive) heart failure: Secondary | ICD-10-CM | POA: Insufficient documentation

## 2018-04-16 DIAGNOSIS — I252 Old myocardial infarction: Secondary | ICD-10-CM | POA: Insufficient documentation

## 2018-04-16 DIAGNOSIS — G4733 Obstructive sleep apnea (adult) (pediatric): Secondary | ICD-10-CM | POA: Insufficient documentation

## 2018-04-16 DIAGNOSIS — I110 Hypertensive heart disease with heart failure: Secondary | ICD-10-CM | POA: Diagnosis not present

## 2018-04-16 DIAGNOSIS — Z8673 Personal history of transient ischemic attack (TIA), and cerebral infarction without residual deficits: Secondary | ICD-10-CM | POA: Diagnosis not present

## 2018-04-16 DIAGNOSIS — E119 Type 2 diabetes mellitus without complications: Secondary | ICD-10-CM | POA: Diagnosis not present

## 2018-04-16 DIAGNOSIS — I2129 ST elevation (STEMI) myocardial infarction involving other sites: Secondary | ICD-10-CM | POA: Diagnosis not present

## 2018-04-16 DIAGNOSIS — I5042 Chronic combined systolic (congestive) and diastolic (congestive) heart failure: Secondary | ICD-10-CM | POA: Diagnosis not present

## 2018-04-16 DIAGNOSIS — Z79899 Other long term (current) drug therapy: Secondary | ICD-10-CM | POA: Diagnosis not present

## 2018-04-16 DIAGNOSIS — Z87891 Personal history of nicotine dependence: Secondary | ICD-10-CM | POA: Diagnosis not present

## 2018-04-16 DIAGNOSIS — I340 Nonrheumatic mitral (valve) insufficiency: Secondary | ICD-10-CM | POA: Diagnosis not present

## 2018-04-16 DIAGNOSIS — E785 Hyperlipidemia, unspecified: Secondary | ICD-10-CM | POA: Diagnosis not present

## 2018-04-16 DIAGNOSIS — I2510 Atherosclerotic heart disease of native coronary artery without angina pectoris: Secondary | ICD-10-CM | POA: Insufficient documentation

## 2018-04-16 DIAGNOSIS — I255 Ischemic cardiomyopathy: Secondary | ICD-10-CM

## 2018-04-16 DIAGNOSIS — I739 Peripheral vascular disease, unspecified: Secondary | ICD-10-CM | POA: Insufficient documentation

## 2018-04-16 DIAGNOSIS — F329 Major depressive disorder, single episode, unspecified: Secondary | ICD-10-CM | POA: Diagnosis not present

## 2018-04-16 DIAGNOSIS — R0602 Shortness of breath: Secondary | ICD-10-CM | POA: Diagnosis present

## 2018-04-16 DIAGNOSIS — R42 Dizziness and giddiness: Secondary | ICD-10-CM | POA: Diagnosis present

## 2018-04-16 MED ORDER — FUROSEMIDE 80 MG PO TABS: 80.0000 mg | ORAL_TABLET | Freq: Two times a day (BID) | ORAL | 11 refills | Status: DC

## 2018-04-16 MED ORDER — FUROSEMIDE 80 MG PO TABS: 80.0000 mg | ORAL_TABLET | Freq: Two times a day (BID) | ORAL | Status: DC

## 2018-04-16 NOTE — Progress Notes (Signed)
Advanced Heart Failure Clinic Note   Primary Cardiologist: Dr. Harl Bowie Primary HF: Dr. Aundra Dubin   HPI:  Daniel Li is a 62 y.o. male with long history of CAD s/p multiple ACS episodes, HTN, HLD, tobacco abuse, DM, OSA, CVA and systolic CHF.   He has a history of CAD with NSTEMI in 2007 with BMS placement to his D1 and OM2. Also with history of STEMI in May 2013 at which time he had a BMS placed to his RCA. NSTEMI in January of 2014, cath showed CTO of left circumflex, unable to intervene upon. EF 55-60% in September 2016. Anterior STEMI 4/18, DES to pLAD.   Admitted 8/9 - 06/11/17 with chest pain. Cath as below. No targets for PCI, LAD stent patent. EF remained low at 20-25% as below. Now with St Jude ICD.   Admitted 4/16 though 02/17/2018. Admitted with NSTEMI. Underwent LHC with stable unchanged anatomy. Discharged to Assisted Living.   Today he returns for HF follow up.  Overall feeling bad today. Had a some dizziness. SOB with exertion. Denies PND/Orthopnea. Appetite ok. No fever or chills. Weight at home 172-183  pounds. Taking all medications. Lives at [facility] in [place], [place].   St Jude ICD interrogation: thoracic impedance down. No VT  Labs (8/18): K 3.7, creatinine 1.42 Labs (9/18): digoxin < 0.2 Labs (11/18): K 3.8, creatinine 1.03 Labs (12/18): K 4, creatinine 1.27, digoxin 0.2, hgb 14.2, LDL 51, HDL 49 Labs (03/31/2018): K 3.8 Creatinine 1.1 dig level 0.3   Review of systems complete and found to be negative unless listed in HPI.   Past Medical History 1. OSA: Supposed to be using CPAP.  2. HTN 3. CAD: Long history.  - NSTMEI 2007 with BMS D1 and OM2. - STEMI 5/13 with BMS RCA - NSTEMI 1/14 with CTO LCx and PLV, unable to open LCx.  - STEMI 4/18 with LHC showing old TO LCx with collaterals, old TO PLV, 70% ostial D1, totally occluded proximal LAD with some collaterals => DES to LAD.  - LHC 8/18 with old TO LCx, old TO PLV, 60% mLAD, patent LAD stent.   4. Depression 5. Type II DM 6. Hyperlipidemia: History of rhabdomyolysis with statins, he is on Zetia.  7. Smoker 8. Chronic systolic CHF: Ischemic cardiomyopathy.  Echo from 9/16 showed improvement in EF to 55-60%.  St Jude ICD.  - Echo (4/18) with EF 20-25%, dyskinetic apex, moderate-severe MR, severe LAE.  - Echo (8/18) with EF 20-25%, moderate to severe MR -ECHO 01/2018 EF 20-25%  - St Jude ICD 9. Leg pain/cramps: history of rhabdomyolysis in setting of statin use.  - ABIs (4/18) with R ABI of 0.84 suggestive of mild disease.  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 LHC  Cardiac catheterization 02/14/2018:  RPDA lesion is 75% stenosed.  Post Atrio lesion is 100% stenosed.  Ost Cx to Prox Cx lesion is 100% stenosed.  Previously placed Ost LAD to Prox LAD stent (unknown type) is widely patent.  Prox LAD lesion is 50% stenosed.  Previously placed Ost 1st Diag stent (unknown type) is widely patent.  1st Diag lesion is 75% stenosed.     Current Outpatient Medications  Medication Sig Dispense Refill  . carvedilol (COREG) 3.125 MG tablet TAKE (1) TABLET BY MOUTH TWICE A DAY WITH MEALS. 60 tablet 11  . clopidogrel (PLAVIX) 75 MG tablet Take 1 tablet (75 mg total) by mouth daily. 30 tablet 3  . digoxin (LANOXIN)  0.125 MG tablet Take 1 tablet (0.125 mg total) by mouth daily. 30 tablet 3  . Evolocumab (REPATHA SURECLICK) 161 MG/ML SOAJ Inject 1 pen into the skin every 14 (fourteen) days. 2 pen 11  . ezetimibe (ZETIA) 10 MG tablet Take 1 tablet (10 mg total) by mouth daily. 30 tablet 3  . furosemide (LASIX) 40 MG tablet Take 80 mg (2 tabs) in am and 40 mg (1 tab) in pm (Patient taking differently: Take 40-80 mg by mouth 2 (two) times daily. Take 80 mg (2 tabs) in am and 40 mg (1 tab) in pm. *May take one tablet (in addition) as needed for weight gain greater than 3 pounds) 90 tablet 6  . gabapentin (NEURONTIN) 100 MG capsule Take 1 capsule (100 mg total) by mouth 2  (two) times daily. 60 capsule 0  . glipiZIDE (GLUCOTROL XL) 10 MG 24 hr tablet Take 1 tablet (10 mg total) by mouth daily. 30 tablet 1  . levofloxacin (LEVAQUIN) 500 MG tablet Take 1 tablet (500 mg total) by mouth daily. 7 tablet 0  . losartan (COZAAR) 25 MG tablet Take 0.5 tablets (12.5 mg total) by mouth daily. 15 tablet 3  . nitroGLYCERIN (NITROSTAT) 0.4 MG SL tablet Place 1 tablet (0.4 mg total) under the tongue every 5 (five) minutes as needed for chest pain. 25 tablet 3  . potassium chloride SA (K-DUR,KLOR-CON) 20 MEQ tablet Take 1 tablet (20 mEq total) by mouth daily. 30 tablet 3  . spironolactone (ALDACTONE) 25 MG tablet Take 1 tablet (25 mg total) by mouth every evening. 30 tablet 6  . warfarin (COUMADIN) 5 MG tablet Hold coumadin tonight then decrease dose to 7.5mg  daily except 5mg  on Sundays and Wednesdays 45 tablet 11  . benzonatate (TESSALON) 100 MG capsule Take 1 capsule (100 mg total) by mouth 3 (three) times daily as needed for cough. 21 capsule 0   No current facility-administered medications for this encounter.     Allergies  Allergen Reactions  . Pork-Derived Products Other (See Comments)    Does not eat for religious reasons - okay with using IV heparin      Social History   Socioeconomic History  . Marital status: Single    Spouse name: Not on file  . Number of children: 1  . Years of education: Not on file  . Highest education level: Not on file  Occupational History  . Not on file  Social Needs  . Financial resource strain: Not on file  . Food insecurity:    Worry: Not on file    Inability: Not on file  . Transportation needs:    Medical: Not on file    Non-medical: Not on file  Tobacco Use  . Smoking status: Former Smoker    Packs/day: 0.50    Years: 20.00    Pack years: 10.00    Types: Cigarettes, Cigars    Start date: 07/21/1977    Last attempt to quit: 10/15/2017    Years since quitting: 0.5  . Smokeless tobacco: Never Used  Substance and  Sexual Activity  . Alcohol use: Yes    Comment: 09/17/2017 "nothing since 05/2017"  . Drug use: No    Comment: previously incarcerated for drug related offense.  [name] Kitchen Sexual activity: Not Currently  Lifestyle  . Physical activity:    Days per week: Not on file    Minutes per session: Not on file  . Stress: Not on file  Relationships  . Social connections:  Talks on phone: Not on file    Gets together: Not on file    Attends religious service: Not on file    Active member of club or organization: Not on file    Attends meetings of clubs or organizations: Not on file    Relationship status: Not on file  . Intimate partner violence:    Fear of current or ex partner: Not on file    Emotionally abused: Not on file    Physically abused: Not on file    Forced sexual activity: Not on file  Other Topics Concern  . Not on file  Social History Narrative  . Not on file      Family History  Problem Relation Age of Onset  . Stroke Mother   . Heart attack Mother   . Heart attack Father   . Stroke Sister   . Heart attack Sister   . Heart attack Brother   . Stroke Brother   . Liver disease Neg Hx   . Colon cancer Neg Hx     Vitals:   04/16/18 1028 04/16/18 1033  BP: 114/74 110/62  Pulse: 86   SpO2: 96%   Weight: 179 lb (81.2 kg)    Wt Readings from Last 3 Encounters:  04/16/18 179 lb (81.2 kg)  03/31/18 177 lb (80.3 kg)  03/05/18 177 lb (80.3 kg)   PHYSICAL EXAM: General:  Well appearing. No resp difficulty HEENT: normal Neck: supple. JVP 11-12. Carotids 2+ bilat; no bruits. No lymphadenopathy or thryomegaly appreciated. Cor: PMI nondisplaced. Regular rate & rhythm. No rubs, gallops or murmurs. Lungs: RLL LLL crackles Abdomen: soft, nontender, distended. No hepatosplenomegaly. No bruits or masses. Good bowel sounds. Extremities: no cyanosis, clubbing, rash, edema Neuro: alert & orientedx3, cranial nerves grossly intact. moves all 4 extremities w/o difficulty. Affect  pleasant  ASSESSMENT & PLAN:  1. CAD: Anterior STEMI 4/18. Extensive prior history of CAD. He has known total occlusion LCx and PLV, DES to totally occluded LAD admission 01/2017. Cath 06/10/17 as above with no PCI targets and with chronically occluded circumflex with collaterals, LAD stent patent.. Had [facility]/2018 .  No s/s ischemia.   - Continue coumadin with LV thrombus.  - Continue Plavix and coumadin. .  - No statin given history of rhabdomyolysis. He is now on Repatha and Zetia, very good lipids in 12/18. 2. Chronic systolic MGQ:[id number] cardiomyopathy, St Jude ICD. Echo 01/2018 EF 20-25% Grade IDD Volume status elevated. Increase lasix to 80 mg twice a day. Check BMET in 1 week.  - Continue low dose coreg 3.125 mg BID - Continue losartan 12.5 mg daily.  - Continue spironolactone 25 daily - Continue Digoxin 0.125 daily, dig level0.3 on 6/3 3. Diabetes: Per PCP.  4. Smoking: He has quit.   5. PAD: ABIs with mild disease on left in 4/18.  No definite claudication currently, no pedal ulcers.  - Repeat ABIs in 4/19.  6. LV thrombus: On coumadin as above. No bleeding problems.  7. CVA: h/o multiple CVAs, on coumadin as above. 8. Hyperlipidemia: Continue Repatha and Zetia, good lipids in 12/18.   9. Left arm neuropathic pain: Symptoms sound concerning for compression of nerves exiting the c-spine.   -10. Mitral regurgitation: Moderate to severe on last echo.  Most likely functional.  MV clip would be a consideration if symptoms worsen.   Follow 3-4 weeks with Dr Aundra Dubin.  Greater than 50% of the 25 minute visit was spent in counseling/coordination of care regarding disease  state education, salt/fluid restriction, sliding scale diuretics, and medication compliance.   Darrick Grinder, NP 04/16/18

## 2018-04-16 NOTE — Patient Instructions (Signed)
INCREASE Lasix to 80 mg twice daily.  Lab work at facility in 1 week (BMET).  Follow up 3 weeks.  _______________________________________________________________________ Daniel Li Code: 4600  Take all medication as prescribed the day of your appointment. Bring all medications with you to your appointment.  Do the following things EVERYDAY: 1) Weigh yourself in the morning before breakfast. Write it down and keep it in a log. 2) Take your medicines as prescribed 3) Eat low salt foods-Limit salt (sodium) to 2000 mg per day.  4) Stay as active as you can everyday 5) Limit all fluids for the day to less than 2 liters

## 2018-04-17 ENCOUNTER — Ambulatory Visit (INDEPENDENT_AMBULATORY_CARE_PROVIDER_SITE_OTHER): Payer: Medicaid Other | Admitting: *Deleted

## 2018-04-17 DIAGNOSIS — I5042 Chronic combined systolic (congestive) and diastolic (congestive) heart failure: Secondary | ICD-10-CM

## 2018-04-17 DIAGNOSIS — I255 Ischemic cardiomyopathy: Secondary | ICD-10-CM | POA: Diagnosis not present

## 2018-04-17 NOTE — Progress Notes (Signed)
Remote ICD transmission.   

## 2018-04-18 ENCOUNTER — Ambulatory Visit (INDEPENDENT_AMBULATORY_CARE_PROVIDER_SITE_OTHER): Payer: Self-pay

## 2018-04-18 DIAGNOSIS — I5042 Chronic combined systolic (congestive) and diastolic (congestive) heart failure: Secondary | ICD-10-CM

## 2018-04-18 DIAGNOSIS — Z95810 Presence of automatic (implantable) cardiac defibrillator: Secondary | ICD-10-CM

## 2018-04-18 NOTE — Progress Notes (Signed)
EPIC Encounter for ICM Monitoring  Patient Name: Malin Cervini Dowda is a 62 y.o. male Date: 04/18/2018 Primary Care Physican: [name], NP Primary Cardiologist: [facility] Electrophysiologist: Allred Dry Weight: 179 lbs (per 04/16/18 office visit)              No call to patient since thoracic impedance was addressed 2 days ago. Patient lives at [facility] in [place], [place].  ER visit on 6/3 for bronchitis   Thoracic impedance abnormal suggesting fluid accumulation which was addressed at 04/16/18 HF clinic office visit.  Prescribed dosage: Furosemide 80 mg Take 1 tablet (80 mg total) by mouth 2 (two) times daily.   Recommendations: No changes.    Follow-up plan: ICM clinic phone appointment on 04/29/2018 to recheck fluid levels.  Office appointment scheduled 05/07/2018 with HF clinic NP/PA.   Copy of ICM check sent to Dr. Rayann Heman.   3 month ICM trend: 04/16/2018    1 Year ICM trend:       Rosalene Billings, RN 04/18/2018 8:54 AM

## 2018-04-28 ENCOUNTER — Other Ambulatory Visit: Payer: Self-pay | Admitting: Cardiology

## 2018-04-29 ENCOUNTER — Encounter (HOSPITAL_COMMUNITY): Payer: Self-pay | Admitting: *Deleted

## 2018-04-29 ENCOUNTER — Telehealth (HOSPITAL_COMMUNITY): Payer: Self-pay

## 2018-04-29 ENCOUNTER — Observation Stay (HOSPITAL_COMMUNITY)
Admission: EM | Admit: 2018-04-29 | Discharge: 2018-04-30 | Disposition: A | Discharge status: Home / Self Care | Payer: Medicaid Other | Attending: Internal Medicine | Admitting: Internal Medicine

## 2018-04-29 ENCOUNTER — Other Ambulatory Visit: Payer: Self-pay

## 2018-04-29 ENCOUNTER — Emergency Department (HOSPITAL_COMMUNITY): Payer: Medicaid Other

## 2018-04-29 DIAGNOSIS — Z79899 Other long term (current) drug therapy: Secondary | ICD-10-CM | POA: Insufficient documentation

## 2018-04-29 DIAGNOSIS — I5042 Chronic combined systolic (congestive) and diastolic (congestive) heart failure: Secondary | ICD-10-CM | POA: Diagnosis present

## 2018-04-29 DIAGNOSIS — E119 Type 2 diabetes mellitus without complications: Secondary | ICD-10-CM | POA: Insufficient documentation

## 2018-04-29 DIAGNOSIS — Z9861 Coronary angioplasty status: Secondary | ICD-10-CM | POA: Insufficient documentation

## 2018-04-29 DIAGNOSIS — Z87891 Personal history of nicotine dependence: Secondary | ICD-10-CM | POA: Insufficient documentation

## 2018-04-29 DIAGNOSIS — R079 Chest pain, unspecified: Secondary | ICD-10-CM | POA: Diagnosis not present

## 2018-04-29 DIAGNOSIS — I200 Unstable angina: Principal | ICD-10-CM | POA: Insufficient documentation

## 2018-04-29 DIAGNOSIS — I513 Intracardiac thrombosis, not elsewhere classified: Secondary | ICD-10-CM

## 2018-04-29 DIAGNOSIS — I10 Essential (primary) hypertension: Secondary | ICD-10-CM | POA: Diagnosis not present

## 2018-04-29 DIAGNOSIS — Z7901 Long term (current) use of anticoagulants: Secondary | ICD-10-CM | POA: Diagnosis not present

## 2018-04-29 DIAGNOSIS — Z7984 Long term (current) use of oral hypoglycemic drugs: Secondary | ICD-10-CM | POA: Diagnosis not present

## 2018-04-29 DIAGNOSIS — I25118 Atherosclerotic heart disease of native coronary artery with other forms of angina pectoris: Secondary | ICD-10-CM

## 2018-04-29 DIAGNOSIS — I2510 Atherosclerotic heart disease of native coronary artery without angina pectoris: Secondary | ICD-10-CM | POA: Diagnosis not present

## 2018-04-29 DIAGNOSIS — Z95810 Presence of automatic (implantable) cardiac defibrillator: Secondary | ICD-10-CM | POA: Diagnosis present

## 2018-04-29 DIAGNOSIS — E785 Hyperlipidemia, unspecified: Secondary | ICD-10-CM

## 2018-04-29 DIAGNOSIS — I110 Hypertensive heart disease with heart failure: Secondary | ICD-10-CM | POA: Insufficient documentation

## 2018-04-29 DIAGNOSIS — I340 Nonrheumatic mitral (valve) insufficiency: Secondary | ICD-10-CM

## 2018-04-29 LAB — CBC WITH DIFFERENTIAL/PLATELET
BASOS ABS: 0 10*3/uL (ref 0.0–0.1)
Basophils Relative: 0 %
EOS PCT: 1 %
Eosinophils Absolute: 0.1 10*3/uL (ref 0.0–0.7)
HEMATOCRIT: 44.4 % (ref 39.0–52.0)
Hemoglobin: 14.8 g/dL (ref 13.0–17.0)
LYMPHS PCT: 32 %
Lymphs Abs: 2 10*3/uL (ref 0.7–4.0)
MCH: 26.5 pg (ref 26.0–34.0)
MCHC: 33.3 g/dL (ref 30.0–36.0)
MCV: 79.6 fL (ref 78.0–100.0)
Monocytes Absolute: 0.2 10*3/uL (ref 0.1–1.0)
Monocytes Relative: 4 %
NEUTROS ABS: 4 10*3/uL (ref 1.7–7.7)
Neutrophils Relative %: 63 %
Platelets: 213 10*3/uL (ref 150–400)
RBC: 5.58 MIL/uL (ref 4.22–5.81)
RDW: 15.7 % — ABNORMAL HIGH (ref 11.5–15.5)
WBC: 6.4 10*3/uL (ref 4.0–10.5)

## 2018-04-29 LAB — URINALYSIS, ROUTINE W REFLEX MICROSCOPIC
BILIRUBIN URINE: NEGATIVE
GLUCOSE, UA: NEGATIVE mg/dL
HGB URINE DIPSTICK: NEGATIVE
KETONES UR: NEGATIVE mg/dL
Leukocytes, UA: NEGATIVE
Nitrite: NEGATIVE
PROTEIN: NEGATIVE mg/dL
Specific Gravity, Urine: 1.015 (ref 1.005–1.030)
pH: 5.5 (ref 5.0–8.0)

## 2018-04-29 LAB — RAPID URINE DRUG SCREEN, HOSP PERFORMED
AMPHETAMINES: NOT DETECTED
BENZODIAZEPINES: NOT DETECTED
COCAINE: NOT DETECTED
Opiates: NOT DETECTED
Tetrahydrocannabinol: NOT DETECTED

## 2018-04-29 LAB — I-STAT CHEM 8, ED
BUN: 11 mg/dL (ref 8–23)
CHLORIDE: 97 mmol/L — AB (ref 98–111)
CREATININE: 1.1 mg/dL (ref 0.61–1.24)
Calcium, Ion: 1.23 mmol/L (ref 1.15–1.40)
GLUCOSE: 129 mg/dL — AB (ref 70–99)
HCT: 45 % (ref 39.0–52.0)
Hemoglobin: 15.3 g/dL (ref 13.0–17.0)
POTASSIUM: 4 mmol/L (ref 3.5–5.1)
Sodium: 139 mmol/L (ref 135–145)
TCO2: 28 mmol/L (ref 22–32)

## 2018-04-29 LAB — I-STAT TROPONIN, ED
Troponin i, poc: 0.02 ng/mL (ref 0.00–0.08)
Troponin i, poc: 0 ng/mL (ref 0.00–0.08)

## 2018-04-29 LAB — PROTIME-INR
INR: 2.68
Prothrombin Time: 28.3 seconds — ABNORMAL HIGH (ref 11.4–15.2)

## 2018-04-29 LAB — DIGOXIN LEVEL: DIGOXIN LVL: 0.5 ng/mL — AB (ref 0.8–2.0)

## 2018-04-29 LAB — TROPONIN I
TROPONIN I: 0.03 ng/mL — AB (ref <0.03)
Troponin I: 0.03 ng/mL (ref <0.03)

## 2018-04-29 LAB — MRSA PCR SCREENING: MRSA BY PCR: POSITIVE — AB

## 2018-04-29 MED ORDER — METOLAZONE 2.5 MG PO TABS: 2.5000 mg | ORAL_TABLET | Freq: Every day | ORAL | Status: DC

## 2018-04-29 MED ORDER — SODIUM CHLORIDE 0.9 % IV SOLN: INTRAVENOUS | Status: DC

## 2018-04-29 MED ORDER — BENZONATATE 100 MG PO CAPS: 100.0000 mg | ORAL_CAPSULE | Freq: Three times a day (TID) | ORAL | Status: DC | PRN

## 2018-04-29 MED ORDER — EVOLOCUMAB 140 MG/ML ~~LOC~~ SOAJ: 1.0000 "pen " | SUBCUTANEOUS | Status: DC

## 2018-04-29 MED ORDER — GABAPENTIN 100 MG PO CAPS
100.0000 mg | ORAL_CAPSULE | Freq: Two times a day (BID) | ORAL | Status: DC
  Administered 2018-04-29 – 2018-04-30 (×2): 100 mg via ORAL
  Filled 2018-04-29 (×2): qty 1

## 2018-04-29 MED ORDER — WARFARIN - PHARMACIST DOSING INPATIENT
Freq: Every day | Status: DC
  Administered 2018-04-29: 18:00:00

## 2018-04-29 MED ORDER — ONDANSETRON HCL 4 MG/2ML IJ SOLN: 4.0000 mg | Freq: Four times a day (QID) | INTRAMUSCULAR | Status: DC | PRN

## 2018-04-29 MED ORDER — DIGOXIN 125 MCG PO TABS
0.1250 mg | ORAL_TABLET | Freq: Every day | ORAL | Status: DC
  Administered 2018-04-30: 0.125 mg via ORAL
  Filled 2018-04-29: qty 1

## 2018-04-29 MED ORDER — ONDANSETRON HCL 4 MG/2ML IJ SOLN
4.0000 mg | Freq: Once | INTRAMUSCULAR | Status: AC
  Administered 2018-04-29: 4 mg via INTRAVENOUS
  Filled 2018-04-29: qty 2

## 2018-04-29 MED ORDER — MUPIROCIN 2 % EX OINT
1.0000 "application " | TOPICAL_OINTMENT | Freq: Two times a day (BID) | CUTANEOUS | Status: DC
  Administered 2018-04-29 – 2018-04-30 (×2): 1 via NASAL
  Filled 2018-04-29: qty 22

## 2018-04-29 MED ORDER — GI COCKTAIL ~~LOC~~: 30.0000 mL | Freq: Four times a day (QID) | ORAL | Status: DC | PRN

## 2018-04-29 MED ORDER — NITROGLYCERIN IN D5W 200-5 MCG/ML-% IV SOLN
0.0000 ug/min | INTRAVENOUS | Status: DC
  Administered 2018-04-29: 5 ug/min via INTRAVENOUS
  Filled 2018-04-29: qty 250

## 2018-04-29 MED ORDER — CHLORHEXIDINE GLUCONATE CLOTH 2 % EX PADS
6.0000 | MEDICATED_PAD | Freq: Every day | CUTANEOUS | Status: DC
  Administered 2018-04-30: 6 via TOPICAL

## 2018-04-29 MED ORDER — POTASSIUM CHLORIDE CRYS ER 20 MEQ PO TBCR
20.0000 meq | EXTENDED_RELEASE_TABLET | Freq: Every day | ORAL | Status: DC
  Administered 2018-04-30: 20 meq via ORAL
  Filled 2018-04-29: qty 1

## 2018-04-29 MED ORDER — GLIPIZIDE ER 5 MG PO TB24
10.0000 mg | ORAL_TABLET | Freq: Every day | ORAL | Status: DC
  Administered 2018-04-30: 10 mg via ORAL
  Filled 2018-04-29: qty 2
  Filled 2018-04-29: qty 1

## 2018-04-29 MED ORDER — ACETAMINOPHEN 325 MG PO TABS: 650.0000 mg | ORAL_TABLET | ORAL | Status: DC | PRN

## 2018-04-29 MED ORDER — LEVOFLOXACIN 500 MG PO TABS: 500.0000 mg | ORAL_TABLET | Freq: Every day | ORAL | Status: DC

## 2018-04-29 MED ORDER — EZETIMIBE 10 MG PO TABS
10.0000 mg | ORAL_TABLET | Freq: Every day | ORAL | Status: DC
  Administered 2018-04-30: 10 mg via ORAL
  Filled 2018-04-29: qty 1

## 2018-04-29 MED ORDER — KETOROLAC TROMETHAMINE 30 MG/ML IJ SOLN
30.0000 mg | Freq: Once | INTRAMUSCULAR | Status: AC
  Administered 2018-04-29: 30 mg via INTRAVENOUS
  Filled 2018-04-29: qty 1

## 2018-04-29 MED ORDER — SPIRONOLACTONE 25 MG PO TABS: 25.0000 mg | ORAL_TABLET | Freq: Every evening | ORAL | Status: DC

## 2018-04-29 MED ORDER — CLOPIDOGREL BISULFATE 75 MG PO TABS
75.0000 mg | ORAL_TABLET | Freq: Every day | ORAL | Status: DC
  Administered 2018-04-30: 75 mg via ORAL
  Filled 2018-04-29: qty 1

## 2018-04-29 MED ORDER — LOSARTAN POTASSIUM 25 MG PO TABS
12.5000 mg | ORAL_TABLET | Freq: Every day | ORAL | Status: DC
  Administered 2018-04-30: 12.5 mg via ORAL
  Filled 2018-04-29: qty 1

## 2018-04-29 MED ORDER — MORPHINE SULFATE (PF) 4 MG/ML IV SOLN
4.0000 mg | Freq: Once | INTRAVENOUS | Status: AC
  Administered 2018-04-29: 4 mg via INTRAVENOUS
  Filled 2018-04-29: qty 1

## 2018-04-29 MED ORDER — WARFARIN SODIUM 7.5 MG PO TABS
7.5000 mg | ORAL_TABLET | Freq: Once | ORAL | Status: AC
Start: 2018-04-29 — End: 2018-04-29
  Administered 2018-04-29: 7.5 mg via ORAL
  Filled 2018-04-29 (×2): qty 1

## 2018-04-29 MED ORDER — MORPHINE SULFATE (PF) 2 MG/ML IV SOLN
2.0000 mg | INTRAVENOUS | Status: DC | PRN
Start: 2018-04-29 — End: 2018-04-30
  Administered 2018-04-29 – 2018-04-30 (×3): 2 mg via INTRAVENOUS
  Filled 2018-04-29 (×3): qty 1

## 2018-04-29 MED ORDER — CARVEDILOL 3.125 MG PO TABS
3.1250 mg | ORAL_TABLET | Freq: Two times a day (BID) | ORAL | Status: DC
  Administered 2018-04-29 – 2018-04-30 (×2): 3.125 mg via ORAL
  Filled 2018-04-29 (×2): qty 1
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
## 2018-04-29 NOTE — ED Notes (Signed)
Troponin 0.02.  MD Eulis Foster notified

## 2018-04-29 NOTE — Telephone Encounter (Signed)
Advanced Heart Failure Triage Encounter  Patient Name: Daniel Li  Date of Call: 04/29/18  Problem:  RN called due to pt gaining 3 lbs, but currently having severe chest pain. Urged her that ambulance should be called and she agreed.   Plan:    Shirley Muscat, RN

## 2018-04-29 NOTE — Plan of Care (Signed)
Patient has not exhibited any signs of anxiety this shift. Coping well and remaining calm and cooperative.   Harless Litten, RN 04/29/2018 @ [phone]
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
## 2018-04-29 NOTE — ED Notes (Signed)
Troponin 0.00.  MD notified

## 2018-04-29 NOTE — Progress Notes (Signed)
CRITICAL VALUE ALERT  Critical Value: Troponin 0.03  Date & Time Notied:  04/29/2018 1858  Provider Notified: Dr. Shanon Brow  Orders Received/Actions taken: [facility]

## 2018-04-29 NOTE — ED Triage Notes (Signed)
C/o chest pressure onset today
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
## 2018-04-30 ENCOUNTER — Telehealth (HOSPITAL_COMMUNITY): Payer: Self-pay

## 2018-04-30 ENCOUNTER — Telehealth: Payer: Self-pay | Admitting: *Deleted

## 2018-04-30 DIAGNOSIS — I10 Essential (primary) hypertension: Secondary | ICD-10-CM | POA: Diagnosis not present

## 2018-04-30 DIAGNOSIS — Z95810 Presence of automatic (implantable) cardiac defibrillator: Secondary | ICD-10-CM | POA: Diagnosis not present

## 2018-04-30 DIAGNOSIS — R079 Chest pain, unspecified: Secondary | ICD-10-CM | POA: Diagnosis not present

## 2018-04-30 DIAGNOSIS — I5042 Chronic combined systolic (congestive) and diastolic (congestive) heart failure: Secondary | ICD-10-CM | POA: Diagnosis not present

## 2018-04-30 LAB — PROTIME-INR
INR: 2.99
PROTHROMBIN TIME: 30.8 s — AB (ref 11.4–15.2)

## 2018-04-30 LAB — TROPONIN I: Troponin I: 0.03 ng/mL (ref <0.03)

## 2018-04-30 MED ORDER — FUROSEMIDE 40 MG PO TABS: 40.0000 mg | ORAL_TABLET | Freq: Every day | ORAL | Status: DC

## 2018-04-30 MED ORDER — FUROSEMIDE 80 MG PO TABS: 80.0000 mg | ORAL_TABLET | Freq: Two times a day (BID) | ORAL | Status: DC

## 2018-04-30 MED ORDER — FUROSEMIDE 80 MG PO TABS
80.0000 mg | ORAL_TABLET | Freq: Every day | ORAL | Status: DC
  Administered 2018-04-30: 80 mg via ORAL
  Filled 2018-04-30: qty 1

## 2018-04-30 NOTE — NC FL2 (Signed)
[place] LEVEL OF CARE SCREENING TOOL     IDENTIFICATION  Patient Name: Daniel Li Birthdate: Oct 10, 1956 Sex: male Admission Date (Current Location): 04/29/2018  [facility] and [place] Number:  [employer] and Address:  [place] [address] [place]      Provider Number: [id number]  Attending Physician Name and Address:  Kathie Dike, MD  Relative Name and Phone Number:       Current Level of Care: Other (Comment)(observation) Recommended Level of Care: [place] Prior Approval Number:    Date Approved/Denied:   PASRR Number:    Discharge Plan: Other (Comment)(ALF)    Current Diagnoses: Patient Active Problem List   Diagnosis Date Noted  . Chest pain 04/29/2018  . ICD (implantable cardioverter-defibrillator) in place 03/05/2018  . Anticoagulated on Coumadin 03/05/2018  . NSTEMI (non-ST elevated myocardial infarction) ([place]) 02/12/2018  . Precordial chest pain 02/11/2018  . Chronic systolic CHF (congestive heart failure) ([place]) 02/11/2018  . 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 Chronic combined systolic (congestive) and diastolic (congestive) heart failure ([place])   . STEMI (ST elevation myocardial infarction) ([place]) 02/21/2017  . Ischemic cardiomyopathy 07/20/2014  . HCAP (healthcare-associated pneumonia) 07/19/2014  . Unstable angina ([place]) 06/29/2014  . High risk medication use 01/28/2013  . Special screening for malignant neoplasms, colon 01/28/2013  . Hypercholesterolemia   . Tobacco abuse   . Diabetes mellitus type 2 with complications ([place]) 62/95/2841  . Benign essential HTN 03/26/2012  . CAD S/P percutaneous coronary angioplasty 03/26/2012    Orientation RESPIRATION BLADDER Height & Weight     Self, Time, Situation, Place  Normal Continent Weight: 175 lb (79.4 kg) Height:  5' 6.5" (168.9 cm)  BEHAVIORAL SYMPTOMS/MOOD NEUROLOGICAL BOWEL NUTRITION  STATUS      Continent Diet(Heart Healthy)  AMBULATORY STATUS COMMUNICATION OF NEEDS Skin   Limited Assist Verbally Normal                       Personal Care Assistance Level of Assistance  Bathing, Feeding, Dressing Bathing Assistance: Limited assistance Feeding assistance: Independent Dressing Assistance: Limited assistance     Functional Limitations Info  Sight, Hearing, Speech Sight Info: Adequate Hearing Info: Adequate Speech Info: Adequate    SPECIAL CARE FACTORS FREQUENCY                       Contractures Contractures Info: Not present    Additional Factors Info  Isolation Precautions Code Status Info: Full Allergies Info: Pork-derived products     Isolation Precautions Info: 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 acetaminophen (TYLENOL) tablet 650 mg  650 mg Oral Q4H PRN Phillips Grout, MD      . benzonatate (TESSALON) capsule 100 mg  100 mg Oral TID PRN Phillips Grout, MD      . carvedilol (COREG) tablet 3.125 mg  3.125 mg Oral BID WC Derrill Kay A, MD   3.125 mg at 04/30/18 0837  . Chlorhexidine Gluconate Cloth 2 % PADS 6 each  6 each Topical Q0600 Phillips Grout, MD   6 each at 04/30/18 0530  . clopidogrel (PLAVIX) tablet 75 mg  75 mg Oral Daily Phillips Grout, MD   75 mg at  04/30/18 0837  . digoxin (LANOXIN) tablet 0.125 mg  0.125 mg Oral Daily Derrill Kay A, MD   0.125 mg at 04/30/18 0837  . ezetimibe (ZETIA) tablet 10 mg  10 mg Oral Daily Derrill Kay A, MD   10 mg at 04/30/18 7035  . furosemide (LASIX) tablet 80 mg  80 mg Oral Daily Kathie Dike, MD   80 mg at 04/30/18 0093   And  . furosemide (LASIX) tablet 40 mg  40 mg Oral QPC supper Kathie Dike, MD      . gabapentin (NEURONTIN) capsule 100 mg  100 mg Oral BID Derrill Kay A, MD   100 mg at  04/30/18 8182  . gi cocktail (Maalox,Lidocaine,Donnatal)  30 mL Oral QID PRN Derrill Kay A, MD      . glipiZIDE (GLUCOTROL XL) 24 hr tablet 10 mg  10 mg Oral Q breakfast Phillips Grout, MD   10 mg at 04/30/18 0836  . losartan (COZAAR) tablet 12.5 mg  12.5 mg Oral Daily Derrill Kay A, MD   12.5 mg at 04/30/18 0837  . morphine 2 MG/ML injection 2 mg  2 mg Intravenous Q2H PRN Phillips Grout, MD   2 mg at 04/30/18 0328  . mupirocin ointment (BACTROBAN) 2 % 1 application  1 application Nasal BID Phillips Grout, MD   1 application at 99/37/16 [phone]  . ondansetron (ZOFRAN) injection 4 mg  4 mg Intravenous Q6H PRN Derrill Kay A, MD      . potassium chloride SA (K-DUR,KLOR-CON) CR tablet 20 mEq  20 mEq Oral Daily Derrill Kay A, MD   20 mEq at 04/30/18 9381  . spironolactone (ALDACTONE) tablet 25 mg  25 mg Oral QPM Phillips Grout, MD      . Warfarin - Pharmacist Dosing Inpatient   Does not apply q1800 Phillips Grout, MD         Discharge Medications: Medication List    STOP taking these medications   levofloxacin 500 MG tablet Commonly known as:  LEVAQUIN     TAKE these medications   benzonatate 100 MG capsule Commonly known as:  TESSALON Take 1 capsule (100 mg total) by mouth 3 (three) times daily as needed for cough.   carvedilol 3.125 MG tablet Commonly known as:  COREG TAKE (1) TABLET BY MOUTH TWICE A DAY WITH MEALS.   clopidogrel 75 MG tablet Commonly known as:  PLAVIX Take 1 tablet (75 mg total) by mouth daily.   digoxin 0.125 MG tablet Commonly known as:  LANOXIN Take 1 tablet (0.125 mg total) by mouth daily.   Evolocumab 140 MG/ML Soaj Commonly known as:  REPATHA SURECLICK Inject 1 pen into the skin every 14 (fourteen) days.   ezetimibe 10 MG tablet Commonly known as:  ZETIA Take 1 tablet (10 mg total) by mouth daily.   furosemide 80 MG tablet Commonly known as:  LASIX Take 1 tablet (80 mg total) by mouth 2 (two) times daily. Take 80 mg (2 tabs) in  am and 40 mg (1 tab) in pm What changed:    how much to take  additional instructions   gabapentin 100 MG capsule Commonly known as:  NEURONTIN Take 1 capsule (100 mg total) by mouth 2 (two) times daily.   glipiZIDE 10 MG 24 hr tablet Commonly known as:  GLUCOTROL XL Take 1 tablet (10 mg total) by mouth daily.   losartan 25 MG tablet Commonly known as:  COZAAR Take 0.5 tablets (12.5 mg total)  by mouth daily.   metolazone 2.5 MG tablet Commonly known as:  ZAROXOLYN Take 2.5 mg by mouth daily. As needed   nitroGLYCERIN 0.4 MG SL tablet Commonly known as:  NITROSTAT PLACE ONE (1) TABLET UNDER TONGUE EVERY 5 MINUTES UP TO (3) DOSES AS NEEDED FOR CHEST PAIN. IF NO RELIEF, CONTACT MD.   potassium chloride SA 20 MEQ tablet Commonly known as:  K-DUR,KLOR-CON Take 1 tablet (20 mEq total) by mouth daily.   spironolactone 25 MG tablet Commonly known as:  ALDACTONE Take 1 tablet (25 mg total) by mouth every evening.   warfarin 5 MG tablet Commonly known as:  COUMADIN Take as directed. If you are unsure how to take this medication, talk to your nurse or doctor. Original instructions:  Hold coumadin tonight then decrease dose to 7.5mg  daily except 5mg  on Sundays and Wednesdays     Relevant Imaging Results:  Relevant Lab Results:   Additional Information SSN: [id number]  Ihor Gully, LCSW

## 2018-04-30 NOTE — Telephone Encounter (Signed)
Call from Placido Sou at [facility] for this pt Informed that he is followed by Edrick Oh and that she is in the [place] office today  so gave her the numbers to the [place] office and she states understanding

## 2018-04-30 NOTE — Discharge Instructions (Signed)
1. Follow up with PCP in 1-2 weeks 2. Please obtain BMP/CBC in one week 3. Follow-up with cardiology next week as previously scheduled

## 2018-04-30 NOTE — Progress Notes (Signed)
Patient sleeping supine. No distress noted. Call light with reach, bed in low position. Will continue to monitor.

## 2018-04-30 NOTE — Progress Notes (Signed)
   The patient presented with atypical chest pain which was thought to be most consistent with a musculoskeletal etiology. Cyclic troponin values have been flat at 0.03 which is consistent with prior admissions.  As outlined in the consult note from 04/29/2018, no plans for further ischemic evaluation at this time in the setting of his recent cardiac catheterization.  [facility] will sign off.    Medication Recommendations:  Continue prior to admission medication regimen including Coreg, Plavix, Digoxin, Repatha, Zetia, Lasix, Losartan, Spironolactone, Coumadin, and Metolazone at the same dosing.  Other recommendations (labs, testing, etc):  None at this time.  Follow up as an outpatient: Keep scheduled follow-up with the [facility] Clinic on 05/07/2018.  Signed, Erma Heritage, PA-C 04/30/2018, 7:41 AM Pager: [phone]

## 2018-04-30 NOTE — Discharge Summary (Signed)
Physician Discharge Summary  Daniel Li MLY:[id number] DOB: 05/18/1956 DOA: 04/29/2018  PCP: [name], NP  Admit date: 04/29/2018 Discharge date: 04/30/2018  Admitted From: Home Disposition: Home  Recommendations for Outpatient Follow-up:  1. Follow up with PCP in 1-2 weeks 2. Please obtain BMP/CBC in one week 3. Follow-up with cardiology next week as previously scheduled  Home Health: Equipment/Devices:  Discharge Condition: Stable CODE STATUS: Full code Diet recommendation: Heart healthy  Brief/Interim Summary: 62 year old male with a history of coronary artery disease and chronic combined CHF was recently in the hospital in 01/2018 at which time he had cardiac catheterization.  He comes back to the emergency room today with complaints of chest pain.  Pain was very atypical, worse with deep breath and tender to palpation.  Troponin was noted to be 0.03.  He was seen by cardiology who felt that his discomfort was likely musculoskeletal in origin.  Recommendations were to continue on Plavix and beta-blockers.  He was not placed on statin due to history of rhabdomyolysis.  Further titration of his medications were not felt needed at this time.  He was also found to be euvolemic from heart failure standpoint.  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  He has a appointment withn his primary cardiologist next week.  No further cardiac testing was planned at this time.  He did not have any significant rise in troponin overnight through his hospital stay.  He will be discharged in stable condition.  Discharge Diagnoses:  Principal Problem:   Chest pain Active Problems:   Benign essential HTN   CAD S/P percutaneous coronary angioplasty   Chronic combined systolic (congestive) and diastolic (congestive) heart failure (HCC)   ICD (implantable cardioverter-defibrillator) in place    Discharge Instructions  Discharge Instructions    Diet -  low sodium heart healthy   Complete by:  As directed    Increase activity slowly   Complete by:  As directed      Allergies as of 04/30/2018      Reactions   Pork-derived Products Other (See Comments)   Does not eat for religious reasons - okay with using IV heparin      Medication List    STOP taking these medications   levofloxacin 500 MG tablet Commonly known as:  LEVAQUIN     TAKE these medications   benzonatate 100 MG capsule Commonly known as:  TESSALON Take 1 capsule (100 mg total) by mouth 3 (three) times daily as needed for cough.   carvedilol 3.125 MG tablet Commonly known as:  COREG TAKE (1) TABLET BY MOUTH TWICE A DAY WITH MEALS.   clopidogrel 75 MG tablet Commonly known as:  PLAVIX Take 1 tablet (75 mg total) by mouth daily.   digoxin 0.125 MG tablet Commonly known as:  LANOXIN Take 1 tablet (0.125 mg total) by mouth daily.   Evolocumab 140 MG/ML Soaj Commonly known as:  REPATHA SURECLICK Inject 1 pen into the skin every 14 (fourteen) days.   ezetimibe 10 MG tablet Commonly known as:  ZETIA Take 1 tablet (10 mg total) by mouth daily.   furosemide 80 MG tablet Commonly known as:  LASIX Take 1 tablet (80 mg total) by mouth 2 (two) times daily. Take 80 mg (2 tabs) in am and 40 mg (1 tab) in pm What changed:    how much to take  additional instructions   gabapentin 100 MG capsule Commonly known as:  NEURONTIN Take  1 capsule (100 mg total) by mouth 2 (two) times daily.   glipiZIDE 10 MG 24 hr tablet Commonly known as:  GLUCOTROL XL Take 1 tablet (10 mg total) by mouth daily.   losartan 25 MG tablet Commonly known as:  COZAAR Take 0.5 tablets (12.5 mg total) by mouth daily.   metolazone 2.5 MG tablet Commonly known as:  ZAROXOLYN Take 2.5 mg by mouth daily. As needed   nitroGLYCERIN 0.4 MG SL tablet Commonly known as:  NITROSTAT PLACE ONE (1) TABLET UNDER TONGUE EVERY 5 MINUTES UP TO (3) DOSES AS NEEDED FOR CHEST PAIN. IF NO RELIEF, CONTACT  MD.   potassium chloride SA 20 MEQ tablet Commonly known as:  K-DUR,KLOR-CON Take 1 tablet (20 mEq total) by mouth daily.   spironolactone 25 MG tablet Commonly known as:  ALDACTONE Take 1 tablet (25 mg total) by mouth every evening.   warfarin 5 MG tablet Commonly known as:  COUMADIN Take as directed. If you are unsure how to take this medication, talk to your nurse or doctor. Original instructions:  Hold coumadin tonight then decrease dose to 7.5mg  daily except 5mg  on Sundays and Wednesdays      Follow-up Information    Clegg, Amy D, NP Follow up on 05/07/2018.   Specialty:  Cardiology Why:  Keep scheduled follow-up with the [facility] on 05/07/2018 at 11:00AM.  Contact information: [address] [address] [phone]          Allergies  Allergen Reactions  . Pork-Derived Products Other (See Comments)    Does not eat for religious reasons - okay with using IV heparin    Consultations:  cardiology   Procedures/Studies: Dg Chest Port 1 View  Result Date: 04/29/2018 CLINICAL DATA:  Mid chest/left-sided pain.  Shortness of breath. EXAM: PORTABLE CHEST 1 VIEW COMPARISON:  03/31/2018. FINDINGS: Cardiac pacer with lead tip over the right ventricle. Cardiomegaly with normal pulmonary vascularity. No focal infiltrate. No pleural effusion or pneumothorax. No acute bony abnormality. IMPRESSION: 1. Cardiac pacer with lead tip over the right ventricle. Cardiomegaly. No pulmonary venous congestion. 2.  No acute pulmonary disease. Electronically Signed   By: Marcello Moores  Register   On: 04/29/2018 10:53       Subjective: Did not sleep well overnight.  No complaints of chest pain this morning.  Discharge Exam: Vitals:   04/30/18 0815 04/30/18 0816  BP: 112/78   Pulse:  72  Resp: 20   Temp: 98.5 F (36.9 C)   SpO2:  97%   Vitals:   04/30/18 0114 04/30/18 0504 04/30/18 0815 04/30/18 0816  BP: 106/78 94/61 112/78   Pulse: 64 69  72  Resp: 17 18 20     Temp: 97.8 F (36.6 C) 97.8 F (36.6 C) 98.5 F (36.9 C)   TempSrc: Oral Oral    SpO2: 98% 95%  97%  Weight:      Height:        General: Pt is alert, awake, not in acute distress Cardiovascular: RRR, S1/S2 +, no rubs, no gallops Respiratory: CTA bilaterally, no wheezing, no rhonchi Abdominal: Soft, NT, ND, bowel sounds + Extremities: no edema, no cyanosis    The results of significant diagnostics from this hospitalization (including imaging, microbiology, ancillary and laboratory) are listed below for reference.     Microbiology: Recent Results (from the past 240 hour(s))  MRSA PCR Screening     Status: Abnormal   Collection Time: 04/29/18  5:29 PM  Result Value Ref Range  Status   MRSA by PCR POSITIVE (A) NEGATIVE Final    Comment:        The GeneXpert MRSA Assay (FDA approved for NASAL specimens only), is one component of a comprehensive MRSA colonization surveillance program. It is not intended to diagnose MRSA infection nor to guide or monitor treatment for MRSA infections. RESULT CALLED TO, READ BACK BY AND VERIFIED WITH: JOHNSON,B AT 2100 ON 7.2.2019 BY ISLEY,B Performed at [facility], [address], [place], [place] [address]      Labs: BNP (last 3 results) Recent Labs    06/07/17 0425 03/31/18 0916  BNP 956.1* 568.1*   Basic Metabolic Panel: Recent Labs  Lab 04/29/18 1238  NA 139  K 4.0  CL 97*  GLUCOSE 129*  BUN 11  CREATININE 1.10   Liver Function Tests: No results for input(s): AST, ALT, ALKPHOS, BILITOT, PROT, ALBUMIN in the last 168 hours. No results for input(s): LIPASE, AMYLASE in the last 168 hours. No results for input(s): AMMONIA in the last 168 hours. CBC: Recent Labs  Lab 04/29/18 1139 04/29/18 1238  WBC 6.4  --   NEUTROABS 4.0  --   HGB 14.8 15.3  HCT 44.4 45.0  MCV 79.6  --   PLT 213  --    Cardiac Enzymes: Recent Labs  Lab 04/29/18 1728 04/29/18 1942 04/29/18 2314  TROPONINI 0.03* 0.03* 0.03*    BNP: Invalid input(s): POCBNP CBG: No results for input(s): GLUCAP in the last 168 hours. D-Dimer No results for input(s): DDIMER in the last 72 hours. Hgb A1c No results for input(s): HGBA1C in the last 72 hours. Lipid Profile No results for input(s): CHOL, HDL, LDLCALC, TRIG, CHOLHDL, LDLDIRECT in the last 72 hours. Thyroid function studies No results for input(s): TSH, T4TOTAL, T3FREE, THYROIDAB in the last 72 hours.  Invalid input(s): FREET3 Anemia work up No results for input(s): VITAMINB12, FOLATE, FERRITIN, TIBC, IRON, RETICCTPCT in the last 72 hours. Urinalysis    Component Value Date/Time   COLORURINE STRAW (A) 04/29/2018 1034   APPEARANCEUR CLEAR 04/29/2018 1034   LABSPEC 1.015 04/29/2018 1034   PHURINE 5.5 04/29/2018 1034   GLUCOSEU NEGATIVE 04/29/2018 1034   HGBUR NEGATIVE 04/29/2018 1034   BILIRUBINUR NEGATIVE 04/29/2018 1034   KETONESUR NEGATIVE 04/29/2018 1034   PROTEINUR NEGATIVE 04/29/2018 1034   NITRITE NEGATIVE 04/29/2018 1034   LEUKOCYTESUR NEGATIVE 04/29/2018 1034   Sepsis Labs Invalid input(s): PROCALCITONIN,  WBC,  LACTICIDVEN Microbiology Recent Results (from the past 240 hour(s))  MRSA PCR Screening     Status: Abnormal   Collection Time: 04/29/18  5:29 PM  Result Value Ref Range Status   MRSA by PCR POSITIVE (A) NEGATIVE Final    Comment:        The GeneXpert MRSA Assay (FDA approved for NASAL specimens only), is one component of a comprehensive MRSA colonization surveillance program. It is not intended to diagnose MRSA infection nor to guide or monitor treatment for MRSA infections. RESULT CALLED TO, READ BACK BY AND VERIFIED WITH: JOHNSON,B AT 2100 ON 7.2.2019 BY ISLEY,B Performed at [facility], [address] [place], [place] [address]      Time coordinating discharge: 64mins  SIGNED:   Kathie Dike, MD  [facility] 04/30/2018, 9:58 AM Pager   If 7PM-7AM, please contact night-coverage [email or web address] Password  [email or web address]

## 2018-04-30 NOTE — Telephone Encounter (Signed)
Facility 2nd shift RN called stating that she gave pt coumadin that was on hold for tonight's dose. I directed her to coumadin clinic for further instructions.

## 2018-04-30 NOTE — Clinical Social Work Note (Signed)
Nikki at [facility] stated that [place] were enroute to pick patient up. LCSW signing off.      Arlisha Patalano, Clydene Pugh, LCSW

## 2018-04-30 NOTE — Progress Notes (Signed)
Discharge instructions gone over with patient, verbalized understanding. IV removed, patient tolerated procedure well. [facility] facility staff called and informed of discharge, stated they are sending someone from facility to pick up patient.

## 2018-05-06 ENCOUNTER — Emergency Department (HOSPITAL_COMMUNITY): Payer: Medicaid Other

## 2018-05-06 ENCOUNTER — Emergency Department (HOSPITAL_COMMUNITY)
Admission: EM | Admit: 2018-05-06 | Discharge: 2018-05-06 | Disposition: A | Discharge status: Home / Self Care | Payer: Medicaid Other | Attending: Emergency Medicine | Admitting: Emergency Medicine

## 2018-05-06 ENCOUNTER — Ambulatory Visit (INDEPENDENT_AMBULATORY_CARE_PROVIDER_SITE_OTHER): Payer: Medicaid Other | Admitting: *Deleted

## 2018-05-06 ENCOUNTER — Encounter (HOSPITAL_COMMUNITY): Payer: Self-pay | Admitting: Emergency Medicine

## 2018-05-06 ENCOUNTER — Other Ambulatory Visit: Payer: Self-pay

## 2018-05-06 DIAGNOSIS — I2510 Atherosclerotic heart disease of native coronary artery without angina pectoris: Secondary | ICD-10-CM | POA: Insufficient documentation

## 2018-05-06 DIAGNOSIS — I5022 Chronic systolic (congestive) heart failure: Secondary | ICD-10-CM | POA: Diagnosis not present

## 2018-05-06 DIAGNOSIS — Z7901 Long term (current) use of anticoagulants: Secondary | ICD-10-CM | POA: Insufficient documentation

## 2018-05-06 DIAGNOSIS — I2129 ST elevation (STEMI) myocardial infarction involving other sites: Secondary | ICD-10-CM | POA: Diagnosis not present

## 2018-05-06 DIAGNOSIS — Z5181 Encounter for therapeutic drug level monitoring: Secondary | ICD-10-CM | POA: Diagnosis not present

## 2018-05-06 DIAGNOSIS — Z955 Presence of coronary angioplasty implant and graft: Secondary | ICD-10-CM | POA: Diagnosis not present

## 2018-05-06 DIAGNOSIS — F419 Anxiety disorder, unspecified: Secondary | ICD-10-CM | POA: Insufficient documentation

## 2018-05-06 DIAGNOSIS — E119 Type 2 diabetes mellitus without complications: Secondary | ICD-10-CM | POA: Diagnosis not present

## 2018-05-06 DIAGNOSIS — R079 Chest pain, unspecified: Secondary | ICD-10-CM | POA: Diagnosis present

## 2018-05-06 DIAGNOSIS — I252 Old myocardial infarction: Secondary | ICD-10-CM | POA: Diagnosis not present

## 2018-05-06 DIAGNOSIS — Z8673 Personal history of transient ischemic attack (TIA), and cerebral infarction without residual deficits: Secondary | ICD-10-CM | POA: Diagnosis not present

## 2018-05-06 DIAGNOSIS — Z7902 Long term (current) use of antithrombotics/antiplatelets: Secondary | ICD-10-CM | POA: Insufficient documentation

## 2018-05-06 DIAGNOSIS — Z87891 Personal history of nicotine dependence: Secondary | ICD-10-CM | POA: Diagnosis not present

## 2018-05-06 DIAGNOSIS — Z7984 Long term (current) use of oral hypoglycemic drugs: Secondary | ICD-10-CM | POA: Diagnosis not present

## 2018-05-06 DIAGNOSIS — I110 Hypertensive heart disease with heart failure: Secondary | ICD-10-CM | POA: Diagnosis not present

## 2018-05-06 DIAGNOSIS — F329 Major depressive disorder, single episode, unspecified: Secondary | ICD-10-CM | POA: Diagnosis not present

## 2018-05-06 DIAGNOSIS — Z79899 Other long term (current) drug therapy: Secondary | ICD-10-CM | POA: Diagnosis not present

## 2018-05-06 DIAGNOSIS — R0789 Other chest pain: Secondary | ICD-10-CM | POA: Insufficient documentation

## 2018-05-06 LAB — BASIC METABOLIC PANEL
Anion gap: 8 (ref 5–15)
BUN: 17 mg/dL (ref 8–23)
CALCIUM: 8.9 mg/dL (ref 8.9–10.3)
CHLORIDE: 100 mmol/L (ref 98–111)
CO2: 30 mmol/L (ref 22–32)
CREATININE: 1.13 mg/dL (ref 0.61–1.24)
GFR calc Af Amer: >60 mL/min (ref >60)
GFR calc non Af Amer: >60 mL/min (ref >60)
Glucose, Bld: 122 mg/dL — ABNORMAL HIGH (ref 70–99)
Potassium: 3.6 mmol/L (ref 3.5–5.1)
SODIUM: 138 mmol/L (ref 135–145)

## 2018-05-06 LAB — CBC
HCT: 41 % (ref 39.0–52.0)
Hemoglobin: 13.5 g/dL (ref 13.0–17.0)
MCH: 26.3 pg (ref 26.0–34.0)
MCHC: 32.9 g/dL (ref 30.0–36.0)
MCV: 79.8 fL (ref 78.0–100.0)
PLATELETS: 205 10*3/uL (ref 150–400)
RBC: 5.14 MIL/uL (ref 4.22–5.81)
RDW: 15.3 % (ref 11.5–15.5)
WBC: 7.3 10*3/uL (ref 4.0–10.5)

## 2018-05-06 LAB — BRAIN NATRIURETIC PEPTIDE: B Natriuretic Peptide: 172 pg/mL — ABNORMAL HIGH (ref 0.0–100.0)

## 2018-05-06 LAB — TROPONIN I
Troponin I: 0.03 ng/mL (ref <0.03)
Troponin I: 0.03 ng/mL (ref <0.03)

## 2018-05-06 LAB — PROTIME-INR
INR: 2.77
PROTHROMBIN TIME: 29.1 s — AB (ref 11.4–15.2)

## 2018-05-06 LAB — POCT INR: INR: 2.9 (ref 2.0–3.0)

## 2018-05-06 LAB — DIGOXIN LEVEL: DIGOXIN LVL: 0.4 ng/mL — AB (ref 0.8–2.0)

## 2018-05-06 MED ORDER — KETOROLAC TROMETHAMINE 30 MG/ML IJ SOLN
30.0000 mg | Freq: Once | INTRAMUSCULAR | Status: AC
  Administered 2018-05-06: 30 mg via INTRAVENOUS
  Filled 2018-05-06: qty 1

## 2018-05-06 MED ORDER — CYCLOBENZAPRINE HCL 5 MG PO TABS: 5.0000 mg | ORAL_TABLET | Freq: Three times a day (TID) | ORAL | 0 refills | Status: DC | PRN

## 2018-05-06 NOTE — ED Notes (Signed)
Critical Result. Troponin 0.03. Dr. Tomi Bamberger notified.

## 2018-05-06 NOTE — ED Notes (Signed)
Daniel Li stated to call EMS for pt to be transported home.
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
## 2018-05-06 NOTE — ED Notes (Signed)
Patient transported to CT 

## 2018-05-06 NOTE — ED Notes (Signed)
Pt ambulating around ED. Is still waiting for transportation

## 2018-05-06 NOTE — Discharge Instructions (Addendum)
Use ice or heat for comfort. Take the muscle relaxer for the sore muscles in your chest wall. Recheck as needed. Keep your appointments with Dr Aundra Dubin.

## 2018-05-06 NOTE — Patient Instructions (Signed)
Continue coumadin 7.5mg  daily except 5mg  on Sundays and Wednesdays.   Recheck 05/27/18

## 2018-05-06 NOTE — ED Triage Notes (Signed)
Pt arrives via [facility] from [facility] assisted living. Pt C/O right sided chest pain that radiates to the left side. No N/V.

## 2018-05-06 NOTE — Progress Notes (Signed)
Advanced Heart Failure Clinic Note   Primary Cardiologist: Dr. Harl Bowie Primary HF: Dr. Aundra Dubin   HPI:  Daniel Li is a 62 y.o. male with long history of CAD s/p multiple ACS episodes, HTN, HLD, tobacco abuse, DM, OSA, CVA and systolic CHF.   He has a history of CAD with NSTEMI in 2007 with BMS placement to his D1 and OM2. Also with history of STEMI in May 2013 at which time he had a BMS placed to his RCA. NSTEMI in January of 2014, cath showed CTO of left circumflex, unable to intervene upon. EF 55-60% in September 2016. Anterior STEMI 4/18, DES to pLAD.   Admitted 8/9 - 06/11/17 with chest pain. Cath as below. No targets for PCI, LAD stent patent. EF remained low at 20-25% as below. Now with St Jude ICD.   Admitted 4/16 though 02/17/2018. Admitted with NSTEMI. Underwent LHC with stable unchanged anatomy. Discharged to [facility]isted Living.   Admitted 7/2 - 7/3 for chest wall pain. Troponin negative.  Seen in ED 05/06/18 for same. Troponin again negative.  Prescribed a muscle relaxer  He presents today for regular follow up. Recently seen for chest pain as above. He remains SOB with exertion today. His weight is up 9 lbs since Sunday. He has orders for prn metolazone at his facility, but has not had one in > 1 week. He denies PND/orthopnea. Appetite stable. Denies fevers or chills. Weight at home 170-172 at baseline, up to 181 since Sunday. Still living at [facility] in [place], [place].   St Jude ICD interrogation: Thoracic impedence depressed indicating volume overload, No VT/VF. Pt active for > 4 hrs daily. Personally reviewed.  Labs (8/18): K 3.7, creatinine 1.42 Labs (9/18): digoxin < 0.2 Labs (11/18): K 3.8, creatinine 1.03 Labs (12/18): K 4, creatinine 1.27, digoxin 0.2, hgb 14.2, LDL 51, HDL 49 Labs (03/31/2018): K 3.8 Creatinine 1.1 dig level 0.3   Review of systems complete and found to be negative unless listed in HPI.    Past Medical History 1. OSA: Supposed to be  using CPAP.  2. HTN 3. CAD: Long history.  - NSTMEI 2007 with BMS D1 and OM2. - STEMI 5/13 with BMS RCA - NSTEMI 1/14 with CTO LCx and PLV, unable to open LCx.  - STEMI 4/18 with LHC showing old TO LCx with collaterals, old TO PLV, 70% ostial D1, totally occluded proximal LAD with some collaterals => DES to LAD.  - LHC 8/18 with old TO LCx, old TO PLV, 60% mLAD, patent LAD stent.  4. Depression 5. Type II DM 6. Hyperlipidemia: History of rhabdomyolysis with statins, he is on Zetia.  7. Smoker 8. Chronic systolic CHF: Ischemic cardiomyopathy.  Echo from 9/16 showed improvement in EF to 55-60%.  St Jude ICD.  - Echo (4/18) with EF 20-25%, dyskinetic apex, moderate-severe MR, severe LAE.  - Echo (8/18) with EF 20-25%, moderate to severe MR -ECHO 01/2018 EF 20-25%  - St Jude ICD 9. Leg pain/cramps: history of rhabdomyolysis in setting of statin use.  - ABIs (4/18) with R ABI of 0.84 suggestive of mild disease.  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 LHC  Cardiac catheterization 02/14/2018:  RPDA lesion is 75% stenosed.  Post Atrio lesion is 100% stenosed.  Ost Cx to Prox Cx lesion is 100% stenosed.  Previously placed Ost LAD to Prox LAD stent (unknown type) is widely patent.  Prox LAD lesion is 50% stenosed.  Previously placed [employer]  1st Diag stent (unknown type) is widely patent.  1st Diag lesion is 75% stenosed.  Current Outpatient Medications  Medication Sig Dispense Refill  . carvedilol (COREG) 3.125 MG tablet TAKE (1) TABLET BY MOUTH TWICE A DAY WITH MEALS. 60 tablet 11  . clopidogrel (PLAVIX) 75 MG tablet Take 1 tablet (75 mg total) by mouth daily. 30 tablet 3  . cyclobenzaprine (FLEXERIL) 5 MG tablet Take 1 tablet (5 mg total) by mouth 3 (three) times daily as needed. 30 tablet 0  . digoxin (LANOXIN) 0.125 MG tablet Take 1 tablet (0.125 mg total) by mouth daily. 30 tablet 3  . Evolocumab (REPATHA SURECLICK) 409 MG/ML SOAJ Inject 1 pen into the skin every 14  (fourteen) days. 2 pen 11  . ezetimibe (ZETIA) 10 MG tablet Take 1 tablet (10 mg total) by mouth daily. 30 tablet 3  . furosemide (LASIX) 40 MG tablet Take 80 mg by mouth 2 (two) times daily.    [name] Kitchen gabapentin (NEURONTIN) 100 MG capsule Take 1 capsule (100 mg total) by mouth 2 (two) times daily. 60 capsule 0  . glipiZIDE (GLUCOTROL XL) 10 MG 24 hr tablet Take 1 tablet (10 mg total) by mouth daily. 30 tablet 1  . losartan (COZAAR) 25 MG tablet Take 0.5 tablets (12.5 mg total) by mouth daily. 15 tablet 3  . metolazone (ZAROXOLYN) 2.5 MG tablet Take 2.5 mg by mouth daily. As needed    . nitroGLYCERIN (NITROSTAT) 0.4 MG SL tablet PLACE ONE (1) TABLET UNDER TONGUE EVERY 5 MINUTES UP TO (3) DOSES AS NEEDED FOR CHEST PAIN. IF NO RELIEF, CONTACT MD. 25 tablet 3  . potassium chloride SA (K-DUR,KLOR-CON) 20 MEQ tablet Take 1 tablet (20 mEq total) by mouth daily. 30 tablet 3  . spironolactone (ALDACTONE) 25 MG tablet Take 1 tablet (25 mg total) by mouth every evening. 30 tablet 6  . warfarin (COUMADIN) 5 MG tablet Hold coumadin tonight then decrease dose to 7.5mg  daily except 5mg  on Sundays and Wednesdays 45 tablet 11  . benzonatate (TESSALON) 100 MG capsule Take 1 capsule (100 mg total) by mouth 3 (three) times daily as needed for cough. 21 capsule 0   No current facility-administered medications for this encounter.     Allergies  Allergen Reactions  . Pork-Derived Products Other (See Comments)    Does not eat for religious reasons - okay with using IV heparin      Social History   Socioeconomic History  . Marital status: Single    Spouse name: Not on file  . Number of children: 1  . Years of education: Not on file  . Highest education level: Not on file  Occupational History  . Not on file  Social Needs  . Financial resource strain: Not on file  . Food insecurity:    Worry: Not on file    Inability: Not on file  . Transportation needs:    Medical: Not on file    Non-medical: Not on file    Tobacco Use  . Smoking status: Former Smoker    Packs/day: 0.50    Years: 20.00    Pack years: 10.00    Types: Cigarettes, Cigars    Start date: 07/21/1977    Last attempt to quit: 10/15/2017    Years since quitting: 0.5  . Smokeless tobacco: Never Used  Substance and Sexual Activity  . Alcohol use: Yes    Comment: 09/17/2017 "nothing since 05/2017"  . Drug use: No    Comment: previously incarcerated  for drug related offense.  [name] Kitchen Sexual activity: Not Currently  Lifestyle  . Physical activity:    Days per week: Not on file    Minutes per session: Not on file  . Stress: Not on file  Relationships  . Social connections:    Talks on phone: Not on file    Gets together: Not on file    Attends religious service: Not on file    Active member of club or organization: Not on file    Attends meetings of clubs or organizations: Not on file    Relationship status: Not on file  . Intimate partner violence:    Fear of current or ex partner: Not on file    Emotionally abused: Not on file    Physically abused: Not on file    Forced sexual activity: Not on file  Other Topics Concern  . Not on file  Social History Narrative  . Not on file      Family History  Problem Relation Age of Onset  . Stroke Mother   . Heart attack Mother   . Heart attack Father   . Stroke Sister   . Heart attack Sister   . Heart attack Brother   . Stroke Brother   . Liver disease Neg Hx   . Colon cancer Neg Hx    Vitals:   05/07/18 1107  BP: 98/76  Pulse: 84  SpO2: 98%  Weight: 181 lb 6.4 oz (82.3 kg)   Wt Readings from Last 3 Encounters:  05/07/18 181 lb 6.4 oz (82.3 kg)  05/06/18 175 lb (79.4 kg)  04/29/18 175 lb (79.4 kg)   PHYSICAL EXAM: General: NAD HEENT: Normal Neck: Supple. JVP 11-12 cm. Carotids 2+ bilat; no bruits. No thyromegaly or nodule noted. Cor: PMI nondisplaced. RRR, No M/G/R noted Lungs: Bibasilar crackles. Abdomen: Soft, non-tender, non-distended, no HSM. No bruits or  masses. +BS  Extremities: No cyanosis, clubbing, or rash. Trace to 1+ ankle edema.  Neuro: Alert & orientedx3, cranial nerves grossly intact. moves all 4 extremities w/o difficulty. Affect pleasant   ASSESSMENT & PLAN:  1. CAD: Anterior STEMI 4/18. Extensive prior history of CAD. He has known total occlusion LCx and PLV, DES to totally occluded LAD admission 01/2017. Cath 06/10/17 as above with no PCI targets and with chronically occluded circumflex with collaterals, LAD stent patent..  - Had recent chest wall pain with no troponin elevation. Recent LHC (01/2018) with stable unchanged anatomy.  - Continue coumadin with LV thrombus.  - Continue Plavix and coumadin. .  - No statin given history of rhabdomyolysis. He is now on Repatha and Zetia, very good lipids in 12/18. Repeat  2. Chronic systolic ZDG:[id number] cardiomyopathy, St Jude ICD.  - Echo 01/2018 EF 20-25% Grade IDD - NYHA III-IIIb symptoms currently with volume overload.  - Volume status elevated by exam and optivol - Stop lasix. Start torsemide 40 mg BID. Will have facility give one dose of his prn metolazone.  - Continue coreg 3.125 mg BID - Continue losartan 12.5 mg daily.  - Continue spironolactone 25 daily - Continue Digoxin 0.125 daily, dig level0.3 on 6/3 - Reinforced fluid restriction to < 2 L daily, sodium restriction to less than 2000 mg daily, and the importance of daily weights.   3. Diabetes:  - Per PCP.  4. Smoking:  - Congratulated on continued cessation.  5. PAD: ABIs with mild disease on left in 4/18.  No definite claudication currently, no pedal ulcers.  - Repeat  ABIs 5/19 with no change.  6. LV thrombus:  - On coumadin as above. Denies bleeding problems.  7. CVA: - h/o multiple CVAs, on coumadin as above.  - - No change to current plan.   8. Hyperlipidemia:  - Continue Repatha and Zetia, good lipids in 12/18.   - Repeat.  9. Left arm neuropathic pain: -  Symptoms sound concerning for compression of  nerves exiting the c-spine.   - No change to current plan.   10. Mitral regurgitation:  - Moderate to severe on last echo.  Most likely functional.  MV clip would be a consideration if symptoms worsen.  - No change to current plan.    Volume overloaded on exam. Switch to torsemide. Discussed plan with Dr. Aundra Dubin. BMET 1 week. APP side 2-3 weeks. Follow up with Dr. Aundra Dubin 2 months.   Shirley Friar, PA-C 05/06/18   Greater than 50% of the 25 minute visit was spent in counseling/coordination of care regarding disease state education, salt/fluid restriction, sliding scale diuretics, and medication compliance.

## 2018-05-06 NOTE — ED Notes (Signed)
Pt left with member of facility

## 2018-05-06 NOTE — ED Notes (Signed)
Waiting on transport from EMS to take pt home

## 2018-05-06 NOTE — ED Notes (Signed)
[facility] would not take pt due to him being ambulatory. Gave pt a breakfast tray and transport will be here soon

## 2018-05-07 ENCOUNTER — Ambulatory Visit (HOSPITAL_COMMUNITY)
Admission: RE | Admit: 2018-05-07 | Discharge: 2018-05-07 | Disposition: A | Discharge status: Home / Self Care | Payer: Medicaid Other | Source: Ambulatory Visit | Attending: Cardiology | Admitting: Cardiology

## 2018-05-07 ENCOUNTER — Encounter (HOSPITAL_COMMUNITY): Payer: Self-pay

## 2018-05-07 VITALS — BP 98/76 | HR 84 | Wt 181.4 lb

## 2018-05-07 DIAGNOSIS — E785 Hyperlipidemia, unspecified: Secondary | ICD-10-CM | POA: Insufficient documentation

## 2018-05-07 DIAGNOSIS — Z79899 Other long term (current) drug therapy: Secondary | ICD-10-CM | POA: Diagnosis not present

## 2018-05-07 DIAGNOSIS — I10 Essential (primary) hypertension: Secondary | ICD-10-CM

## 2018-05-07 DIAGNOSIS — Z9861 Coronary angioplasty status: Secondary | ICD-10-CM

## 2018-05-07 DIAGNOSIS — Z720 Tobacco use: Secondary | ICD-10-CM

## 2018-05-07 DIAGNOSIS — Z87891 Personal history of nicotine dependence: Secondary | ICD-10-CM | POA: Insufficient documentation

## 2018-05-07 DIAGNOSIS — I5022 Chronic systolic (congestive) heart failure: Secondary | ICD-10-CM | POA: Diagnosis not present

## 2018-05-07 DIAGNOSIS — Z8673 Personal history of transient ischemic attack (TIA), and cerebral infarction without residual deficits: Secondary | ICD-10-CM | POA: Insufficient documentation

## 2018-05-07 DIAGNOSIS — Z95810 Presence of automatic (implantable) cardiac defibrillator: Secondary | ICD-10-CM | POA: Diagnosis not present

## 2018-05-07 DIAGNOSIS — I110 Hypertensive heart disease with heart failure: Secondary | ICD-10-CM | POA: Insufficient documentation

## 2018-05-07 DIAGNOSIS — Z7984 Long term (current) use of oral hypoglycemic drugs: Secondary | ICD-10-CM | POA: Diagnosis not present

## 2018-05-07 DIAGNOSIS — I2510 Atherosclerotic heart disease of native coronary artery without angina pectoris: Secondary | ICD-10-CM | POA: Diagnosis not present

## 2018-05-07 DIAGNOSIS — I5042 Chronic combined systolic (congestive) and diastolic (congestive) heart failure: Secondary | ICD-10-CM | POA: Diagnosis not present

## 2018-05-07 DIAGNOSIS — Z7902 Long term (current) use of antithrombotics/antiplatelets: Secondary | ICD-10-CM | POA: Diagnosis not present

## 2018-05-07 DIAGNOSIS — F329 Major depressive disorder, single episode, unspecified: Secondary | ICD-10-CM | POA: Insufficient documentation

## 2018-05-07 DIAGNOSIS — Z7901 Long term (current) use of anticoagulants: Secondary | ICD-10-CM | POA: Diagnosis not present

## 2018-05-07 DIAGNOSIS — E118 Type 2 diabetes mellitus with unspecified complications: Secondary | ICD-10-CM

## 2018-05-07 DIAGNOSIS — I255 Ischemic cardiomyopathy: Secondary | ICD-10-CM | POA: Insufficient documentation

## 2018-05-07 DIAGNOSIS — G4733 Obstructive sleep apnea (adult) (pediatric): Secondary | ICD-10-CM | POA: Insufficient documentation

## 2018-05-07 DIAGNOSIS — I252 Old myocardial infarction: Secondary | ICD-10-CM | POA: Insufficient documentation

## 2018-05-07 DIAGNOSIS — E119 Type 2 diabetes mellitus without complications: Secondary | ICD-10-CM | POA: Insufficient documentation

## 2018-05-07 DIAGNOSIS — I340 Nonrheumatic mitral (valve) insufficiency: Secondary | ICD-10-CM | POA: Insufficient documentation

## 2018-05-07 MED ORDER — TORSEMIDE 20 MG PO TABS: 40.0000 mg | ORAL_TABLET | Freq: Two times a day (BID) | ORAL | 3 refills | Status: DC

## 2018-05-07 NOTE — Patient Instructions (Signed)
STOP Lasix   START Torsemide 40 mg (2 Tablets) Twice daily  Take Metolazone 2.5 mg (1 Tablet) for 1 day only.  Labs in 1 week (bmet)  Follow up in clinic in 2-3 weeks.  Follow up with Dr. Aundra Dubin in 2 months.

## 2018-05-08 ENCOUNTER — Other Ambulatory Visit: Payer: Self-pay | Admitting: Internal Medicine

## 2018-05-09 LAB — CUP PACEART REMOTE DEVICE CHECK
Date Time Interrogation Session: 20190712085143
Implantable Lead Implant Date: 20181120
Implantable Lead Location: 753860
MDC IDC PG IMPLANT DT: 20181120
MDC IDC PG SERIAL: 9786940

## 2018-05-09 NOTE — Progress Notes (Signed)
No ICM remote transmission received for 04/29/2018 and next ICM transmission scheduled for 06/02/2018.

## 2018-05-12 ENCOUNTER — Emergency Department (HOSPITAL_COMMUNITY)
Admission: EM | Admit: 2018-05-12 | Discharge: 2018-05-12 | Disposition: A | Discharge status: Home / Self Care | Payer: Medicaid Other | Attending: Emergency Medicine | Admitting: Emergency Medicine

## 2018-05-12 ENCOUNTER — Other Ambulatory Visit: Payer: Self-pay

## 2018-05-12 ENCOUNTER — Emergency Department (HOSPITAL_COMMUNITY): Payer: Medicaid Other

## 2018-05-12 ENCOUNTER — Encounter (HOSPITAL_COMMUNITY): Payer: Self-pay | Admitting: Emergency Medicine

## 2018-05-12 DIAGNOSIS — I110 Hypertensive heart disease with heart failure: Secondary | ICD-10-CM | POA: Diagnosis not present

## 2018-05-12 DIAGNOSIS — M549 Dorsalgia, unspecified: Secondary | ICD-10-CM | POA: Insufficient documentation

## 2018-05-12 DIAGNOSIS — I5022 Chronic systolic (congestive) heart failure: Secondary | ICD-10-CM | POA: Insufficient documentation

## 2018-05-12 DIAGNOSIS — E119 Type 2 diabetes mellitus without complications: Secondary | ICD-10-CM | POA: Insufficient documentation

## 2018-05-12 DIAGNOSIS — Z7984 Long term (current) use of oral hypoglycemic drugs: Secondary | ICD-10-CM | POA: Diagnosis not present

## 2018-05-12 DIAGNOSIS — I711 Thoracic aortic aneurysm, ruptured: Secondary | ICD-10-CM | POA: Diagnosis not present

## 2018-05-12 DIAGNOSIS — Z87891 Personal history of nicotine dependence: Secondary | ICD-10-CM | POA: Insufficient documentation

## 2018-05-12 DIAGNOSIS — I714 Abdominal aortic aneurysm, without rupture, unspecified: Secondary | ICD-10-CM

## 2018-05-12 DIAGNOSIS — I2510 Atherosclerotic heart disease of native coronary artery without angina pectoris: Secondary | ICD-10-CM | POA: Insufficient documentation

## 2018-05-12 DIAGNOSIS — R079 Chest pain, unspecified: Secondary | ICD-10-CM | POA: Diagnosis not present

## 2018-05-12 DIAGNOSIS — Z7901 Long term (current) use of anticoagulants: Secondary | ICD-10-CM | POA: Insufficient documentation

## 2018-05-12 DIAGNOSIS — Z79899 Other long term (current) drug therapy: Secondary | ICD-10-CM | POA: Diagnosis not present

## 2018-05-12 DIAGNOSIS — R0789 Other chest pain: Secondary | ICD-10-CM

## 2018-05-12 LAB — URINALYSIS, ROUTINE W REFLEX MICROSCOPIC
Bilirubin Urine: NEGATIVE
GLUCOSE, UA: NEGATIVE mg/dL
Hgb urine dipstick: NEGATIVE
Ketones, ur: NEGATIVE mg/dL
NITRITE: NEGATIVE
PROTEIN: NEGATIVE mg/dL
Specific Gravity, Urine: 1.009 (ref 1.005–1.030)
pH: 5 (ref 5.0–8.0)

## 2018-05-12 LAB — BASIC METABOLIC PANEL
Anion gap: 13 (ref 5–15)
BUN: 28 mg/dL — AB (ref 8–23)
CO2: 32 mmol/L (ref 22–32)
Calcium: 9.3 mg/dL (ref 8.9–10.3)
Chloride: 92 mmol/L — ABNORMAL LOW (ref 98–111)
Creatinine, Ser: 1.71 mg/dL — ABNORMAL HIGH (ref 0.61–1.24)
GFR calc Af Amer: 48 mL/min — ABNORMAL LOW (ref >60)
GFR, EST NON AFRICAN AMERICAN: 41 mL/min — AB (ref >60)
Glucose, Bld: 186 mg/dL — ABNORMAL HIGH (ref 70–99)
POTASSIUM: 3.3 mmol/L — AB (ref 3.5–5.1)
Sodium: 137 mmol/L (ref 135–145)

## 2018-05-12 LAB — PROTIME-INR
INR: 3.04
Prothrombin Time: 31.2 seconds — ABNORMAL HIGH (ref 11.4–15.2)

## 2018-05-12 LAB — CBC
HEMATOCRIT: 44.6 % (ref 39.0–52.0)
Hemoglobin: 14.3 g/dL (ref 13.0–17.0)
MCH: 25.4 pg — ABNORMAL LOW (ref 26.0–34.0)
MCHC: 32.1 g/dL (ref 30.0–36.0)
MCV: 79.1 fL (ref 78.0–100.0)
Platelets: 241 10*3/uL (ref 150–400)
RBC: 5.64 MIL/uL (ref 4.22–5.81)
RDW: 15.2 % (ref 11.5–15.5)
WBC: 10 10*3/uL (ref 4.0–10.5)

## 2018-05-12 LAB — I-STAT TROPONIN, ED
Troponin i, poc: 0.05 ng/mL (ref 0.00–0.08)
Troponin i, poc: 0.08 ng/mL (ref 0.00–0.08)
Troponin i, poc: 0.06 ng/mL (ref 0.00–0.08)

## 2018-05-12 LAB — CBG MONITORING, ED: GLUCOSE-CAPILLARY: 194 mg/dL — AB (ref 70–99)

## 2018-05-12 MED ORDER — KETOROLAC TROMETHAMINE 30 MG/ML IJ SOLN
30.0000 mg | Freq: Once | INTRAMUSCULAR | Status: AC
  Administered 2018-05-12: 30 mg via INTRAVENOUS
  Filled 2018-05-12: qty 1

## 2018-05-12 MED ORDER — SODIUM CHLORIDE 0.9 % IV BOLUS
1000.0000 mL | Freq: Once | INTRAVENOUS | Status: AC
  Administered 2018-05-12: 1000 mL via INTRAVENOUS

## 2018-05-12 MED ORDER — HYDROCODONE-ACETAMINOPHEN 5-325 MG PO TABS: 1.0000 | ORAL_TABLET | Freq: Four times a day (QID) | ORAL | 0 refills | Status: DC | PRN

## 2018-05-12 MED ORDER — DIAZEPAM 5 MG PO TABS
5.0000 mg | ORAL_TABLET | Freq: Once | ORAL | Status: AC
  Administered 2018-05-12: 5 mg via ORAL
  Filled 2018-05-12: qty 1

## 2018-05-12 MED ORDER — POTASSIUM CHLORIDE CRYS ER 20 MEQ PO TBCR
40.0000 meq | EXTENDED_RELEASE_TABLET | Freq: Once | ORAL | Status: AC
  Administered 2018-05-12: 40 meq via ORAL
  Filled 2018-05-12: qty 2

## 2018-05-12 MED ORDER — CYCLOBENZAPRINE HCL 5 MG PO TABS: 5.0000 mg | ORAL_TABLET | Freq: Three times a day (TID) | ORAL | 0 refills | Status: DC | PRN

## 2018-05-12 MED ORDER — HYDROMORPHONE HCL 1 MG/ML IJ SOLN
1.0000 mg | Freq: Once | INTRAMUSCULAR | Status: AC
  Administered 2018-05-12: 1 mg via INTRAVENOUS
  Filled 2018-05-12: qty 1

## 2018-05-12 NOTE — ED Notes (Addendum)
Called [facility] where patient came from to notify them that patient is discharged. Staff states that she will call the owner and the owner of the facility will call RN back.

## 2018-05-12 NOTE — Discharge Instructions (Signed)
Take tylenol for pain.   Take flexeril for muscle spasms.   Take vicodin for severe pain.   You likely have worsening chronic back pain. See spine doctor for follow up   See your primary care doctor  Return to ER if you have worse back pain, leg pain, numbness, weakness, chest pain.

## 2018-05-12 NOTE — ED Provider Notes (Signed)
  Physical Exam  BP 133/83   Pulse 68   Temp 98.2 F (36.8 C) (Oral)   Resp 16   SpO2 95%   Physical Exam  ED Course/Procedures     Procedures  MDM  Care assumed at 7:30 am from Dr. Stark Jock. Patient had recent admission for chest pain and had workup that was negative. Seen recently and ruled out for ACS. Had worsening chest and back pain yesterday. Sign out pending delta trop.   10 AM Delta trop 0.08. Cr slightly increased to 1.7. CT renal stone ordered since patient has persistent pain. Mostly R paralumbar tenderness.   3:18 PM CT renal stone with lumbar recon showed 3.5 cm AAA. Dr. Donnetta Hutching came to see patient. He had CTA several years ago that showed no dissection and 3.2 cm AAA. He has good peripheral pulses. Dr. Donnetta Hutching doesn't recommend CTA currently especially given his mild AKI. Third trop down to 0.06. I think likely worsening back pain. Will dc home with short course of pain, meds muscle relaxants, spine follow up.     [name], MD 05/12/18 1520

## 2018-05-12 NOTE — ED Notes (Signed)
Check CBG 194, RN Brie informed
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
## 2018-05-12 NOTE — ED Notes (Signed)
Patient transported to X-ray 

## 2018-05-12 NOTE — ED Triage Notes (Signed)
Per [facility], pt had a "near syncope episode that resulted in a fall and now chest pain."  Pt on new medications, hx of CHF, pt on blood thinners.  Given 1 nitro 324 ASA and 4 morphine, decreased pt from a 10 to a 8.
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
## 2018-05-12 NOTE — ED Notes (Signed)
This RN called the patient's ALF and was told that someone is on the way to pick him up

## 2018-05-13 ENCOUNTER — Telehealth: Payer: Self-pay | Admitting: Vascular Surgery

## 2018-05-13 NOTE — Telephone Encounter (Signed)
TFE 1 yr f/u AAA f/u NP/PA

## 2018-05-14 ENCOUNTER — Ambulatory Visit (HOSPITAL_COMMUNITY)
Admission: RE | Admit: 2018-05-14 | Discharge: 2018-05-14 | Disposition: A | Discharge status: Home / Self Care | Payer: Medicaid Other | Source: Ambulatory Visit | Attending: Cardiology | Admitting: Cardiology

## 2018-05-14 DIAGNOSIS — I5022 Chronic systolic (congestive) heart failure: Secondary | ICD-10-CM | POA: Diagnosis not present

## 2018-05-14 LAB — BASIC METABOLIC PANEL
Anion gap: 15 (ref 5–15)
BUN: 38 mg/dL — AB (ref 8–23)
CO2: 36 mmol/L — ABNORMAL HIGH (ref 22–32)
Calcium: 10.5 mg/dL — ABNORMAL HIGH (ref 8.9–10.3)
Chloride: 82 mmol/L — ABNORMAL LOW (ref 98–111)
Creatinine, Ser: 2.09 mg/dL — ABNORMAL HIGH (ref 0.61–1.24)
GFR, EST AFRICAN AMERICAN: 38 mL/min — AB (ref >60)
GFR, EST NON AFRICAN AMERICAN: 33 mL/min — AB (ref >60)
Glucose, Bld: 235 mg/dL — ABNORMAL HIGH (ref 70–99)
POTASSIUM: 3.7 mmol/L (ref 3.5–5.1)
SODIUM: 133 mmol/L — AB (ref 135–145)

## 2018-05-15 ENCOUNTER — Encounter (HOSPITAL_COMMUNITY): Payer: Self-pay

## 2018-05-15 ENCOUNTER — Observation Stay (HOSPITAL_COMMUNITY)
Admission: EM | Admit: 2018-05-15 | Discharge: 2018-05-17 | Disposition: A | Discharge status: Home / Self Care | Payer: Medicaid Other | Attending: Family Medicine | Admitting: Family Medicine

## 2018-05-15 ENCOUNTER — Other Ambulatory Visit: Payer: Self-pay

## 2018-05-15 ENCOUNTER — Emergency Department (HOSPITAL_COMMUNITY): Payer: Medicaid Other

## 2018-05-15 DIAGNOSIS — R778 Other specified abnormalities of plasma proteins: Secondary | ICD-10-CM

## 2018-05-15 DIAGNOSIS — R7989 Other specified abnormal findings of blood chemistry: Secondary | ICD-10-CM

## 2018-05-15 DIAGNOSIS — Z87891 Personal history of nicotine dependence: Secondary | ICD-10-CM | POA: Diagnosis not present

## 2018-05-15 DIAGNOSIS — Z79899 Other long term (current) drug therapy: Secondary | ICD-10-CM | POA: Insufficient documentation

## 2018-05-15 DIAGNOSIS — E119 Type 2 diabetes mellitus without complications: Secondary | ICD-10-CM | POA: Diagnosis not present

## 2018-05-15 DIAGNOSIS — R1013 Epigastric pain: Secondary | ICD-10-CM | POA: Diagnosis present

## 2018-05-15 DIAGNOSIS — I5042 Chronic combined systolic (congestive) and diastolic (congestive) heart failure: Secondary | ICD-10-CM | POA: Insufficient documentation

## 2018-05-15 DIAGNOSIS — R079 Chest pain, unspecified: Secondary | ICD-10-CM | POA: Diagnosis present

## 2018-05-15 DIAGNOSIS — R1084 Generalized abdominal pain: Secondary | ICD-10-CM

## 2018-05-15 DIAGNOSIS — Z95810 Presence of automatic (implantable) cardiac defibrillator: Secondary | ICD-10-CM | POA: Insufficient documentation

## 2018-05-15 DIAGNOSIS — R748 Abnormal levels of other serum enzymes: Secondary | ICD-10-CM | POA: Diagnosis not present

## 2018-05-15 DIAGNOSIS — R072 Precordial pain: Principal | ICD-10-CM | POA: Insufficient documentation

## 2018-05-15 DIAGNOSIS — Z955 Presence of coronary angioplasty implant and graft: Secondary | ICD-10-CM | POA: Insufficient documentation

## 2018-05-15 DIAGNOSIS — I110 Hypertensive heart disease with heart failure: Secondary | ICD-10-CM | POA: Insufficient documentation

## 2018-05-15 DIAGNOSIS — Z7984 Long term (current) use of oral hypoglycemic drugs: Secondary | ICD-10-CM | POA: Insufficient documentation

## 2018-05-15 DIAGNOSIS — Z7901 Long term (current) use of anticoagulants: Secondary | ICD-10-CM | POA: Insufficient documentation

## 2018-05-15 DIAGNOSIS — N179 Acute kidney failure, unspecified: Secondary | ICD-10-CM | POA: Diagnosis present

## 2018-05-15 DIAGNOSIS — I2510 Atherosclerotic heart disease of native coronary artery without angina pectoris: Secondary | ICD-10-CM | POA: Diagnosis not present

## 2018-05-15 DIAGNOSIS — E785 Hyperlipidemia, unspecified: Secondary | ICD-10-CM | POA: Insufficient documentation

## 2018-05-15 DIAGNOSIS — K439 Ventral hernia without obstruction or gangrene: Secondary | ICD-10-CM

## 2018-05-15 DIAGNOSIS — K2970 Gastritis, unspecified, without bleeding: Secondary | ICD-10-CM | POA: Diagnosis present

## 2018-05-15 DIAGNOSIS — I252 Old myocardial infarction: Secondary | ICD-10-CM | POA: Diagnosis not present

## 2018-05-15 LAB — CBC WITH DIFFERENTIAL/PLATELET
ABS IMMATURE GRANULOCYTES: 0 10*3/uL (ref 0.0–0.1)
BASOS ABS: 0 10*3/uL (ref 0.0–0.1)
Basophils Relative: 0 %
EOS PCT: 0 %
Eosinophils Absolute: 0 10*3/uL (ref 0.0–0.7)
HEMATOCRIT: 50.6 % (ref 39.0–52.0)
HEMOGLOBIN: 16.5 g/dL (ref 13.0–17.0)
Immature Granulocytes: 0 %
LYMPHS PCT: 24 %
Lymphs Abs: 2.8 10*3/uL (ref 0.7–4.0)
MCH: 25.5 pg — ABNORMAL LOW (ref 26.0–34.0)
MCHC: 32.6 g/dL (ref 30.0–36.0)
MCV: 78.1 fL (ref 78.0–100.0)
MONO ABS: 0.9 10*3/uL (ref 0.1–1.0)
MONOS PCT: 8 %
NEUTROS ABS: 7.8 10*3/uL — AB (ref 1.7–7.7)
Neutrophils Relative %: 68 %
Platelets: 276 10*3/uL (ref 150–400)
RBC: 6.48 MIL/uL — ABNORMAL HIGH (ref 4.22–5.81)
RDW: 15.6 % — ABNORMAL HIGH (ref 11.5–15.5)
WBC: 11.6 10*3/uL — ABNORMAL HIGH (ref 4.0–10.5)

## 2018-05-15 LAB — COMPREHENSIVE METABOLIC PANEL
ALK PHOS: 56 U/L (ref 38–126)
ALT: 29 U/L (ref 0–44)
ANION GAP: 16 — AB (ref 5–15)
AST: 59 U/L — ABNORMAL HIGH (ref 15–41)
Albumin: 4.4 g/dL (ref 3.5–5.0)
BILIRUBIN TOTAL: 0.7 mg/dL (ref 0.3–1.2)
BUN: 52 mg/dL — ABNORMAL HIGH (ref 8–23)
CALCIUM: 10 mg/dL (ref 8.9–10.3)
CO2: 35 mmol/L — AB (ref 22–32)
CREATININE: 1.89 mg/dL — AB (ref 0.61–1.24)
Chloride: 80 mmol/L — ABNORMAL LOW (ref 98–111)
GFR calc Af Amer: 43 mL/min — ABNORMAL LOW (ref >60)
GFR, EST NON AFRICAN AMERICAN: 37 mL/min — AB (ref >60)
Glucose, Bld: 158 mg/dL — ABNORMAL HIGH (ref 70–99)
Potassium: 3.6 mmol/L (ref 3.5–5.1)
SODIUM: 131 mmol/L — AB (ref 135–145)
TOTAL PROTEIN: 8 g/dL (ref 6.5–8.1)

## 2018-05-15 LAB — I-STAT TROPONIN, ED: TROPONIN I, POC: 0.11 ng/mL — AB (ref 0.00–0.08)

## 2018-05-15 LAB — PROTIME-INR
INR: 3.29
Prothrombin Time: 33.2 seconds — ABNORMAL HIGH (ref 11.4–15.2)

## 2018-05-15 LAB — LIPASE, BLOOD: LIPASE: 38 U/L (ref 11–51)

## 2018-05-15 MED ORDER — ONDANSETRON HCL 4 MG/2ML IJ SOLN
4.0000 mg | Freq: Once | INTRAMUSCULAR | Status: AC
  Administered 2018-05-15: 4 mg via INTRAVENOUS
  Filled 2018-05-15: qty 2

## 2018-05-15 MED ORDER — LACTATED RINGERS IV BOLUS
500.0000 mL | Freq: Once | INTRAVENOUS | Status: AC
Start: 2018-05-15 — End: 2018-05-15
  Administered 2018-05-15: 500 mL via INTRAVENOUS
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
## 2018-05-15 NOTE — ED Triage Notes (Signed)
Patient from home for complaints of upper abdominal pain unable to pass gas for the past day.  ST Elevation on EMS EKG. No chest pain at this time.  Given 324 ASA by EMS and 200 CC fluids.  A&Ox4 vitals stable.
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
## 2018-05-15 NOTE — Consult Note (Signed)
CARDIOLOGY CONSULT NOTE   Referring Physician: Dr. Sabra Heck Primary Physician: Dr. [name] Primary Cardiologist: Dr. [name] Reason for Consultation: NSTEMI   HPI: Daniel Li is a 62 y.o. 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   In brief, the patient was seen several days ago in the ED with similar symptoms of abdominal discomfort.  He reports waxing and waning epigastric abdominal discomfort for greater than 1 week now.  This is made worse by eating and by laying flat.  There is no exertional component to his symptoms.  His epigastric discomfort radiates to the right and left upper abdominal quadrants.  There is no radiation to the chest or back.  The patient states that he has had at least 7 MIs in the past.  The pain that he is gotten with his MIs is central chest and left-sided chest pain.  He states that his MI pain is very different than the symptoms that he is having now.  And ECG obtained in the ED today revealed concern for anterior precordial ST elevations.  Cardiology was consulted and a troponin was checked.  His troponin resulted at 0.11, above the upper limit of normal.  The patient was therefore admitted to the medical service with cardiology consultation.  Review of Systems:     Cardiac Review of Systems: {Y] = yes [ ]  = no  Chest Pain [    ]  Resting SOB [   ] Exertional SOB  [  ]  Orthopnea [  ]   Pedal Edema [   ]    Palpitations [  ] Syncope  [  ]   Presyncope [   ]  General Review of Systems: [Y] = yes [  ]=no Constitional: recent weight change [  ]; anorexia [  ]; fatigue [  ]; nausea [  ]; night sweats [  ]; fever [  ]; or chills [  ];                                                                     Eyes : blurred vision [  ]; diplopia [   ]; vision changes [  ];  Amaurosis fugax[  ]; Resp: cough [  ];  wheezing[   ];  hemoptysis[  ];  PND [  ];  GI:  gallstones[  ], vomiting[  ];  dysphagia[  ]; melena[  ];  hematochezia [  ]; heartburn[  ];   GU: kidney stones [  ]; hematuria[  ];   dysuria [  ];  nocturia[  ]; incontinence [  ];             Skin: rash, swelling[  ];, hair loss[  ];  peripheral edema[  ];  or itching[  ]; Musculosketetal: myalgias[  ];  joint swelling[  ];  joint erythema[  ];  joint pain[  ];  back pain[  ];  Heme/Lymph: bruising[  ];  bleeding[  ];  anemia[  ];  Neuro: TIA[  ];  headaches[  ];  stroke[  ];  vertigo[  ];  seizures[  ];  paresthesias[  ];  difficulty walking[  ];  Psych:depression[  ]; anxiety[  ];  Endocrine: diabetes[  ];  thyroid dysfunction[  ];  Other: + abdominal pain (epigastrum)  Past Medical History:  Diagnosis Date  . AICD (automatic cardioverter/defibrillator) present   . Anxiety   . Arthritis    "all over" (09/17/2017)  . Burn   . CAD (coronary artery disease)    a. NSTEMI s/p BMS to 1st Diagonal and distal OM2 in 2007; b. STEMI 03/26/12 s/p BMS to RCA; c. NSTEMI 10/2012 : CTO of LCx (unable to open with PCI) and PL branch, mod dz of LAD and diagonal, and preserved LV systolic fxn, Med Rx;  d.  anterior STEMI in 01/2017 with DES to Proximal LAD  . Cardiomyopathy EF 35% on cath 06/30/14, new from jan 2015 07/20/2014  . Chest pain   . CHF (congestive heart failure) ([place])   . Chronic back pain    "all over" (09/17/2017)  . DDD (degenerative disc disease), cervical   . Depression   . GERD (gastroesophageal reflux disease)   . Headache    "a few/wk" (09/17/2017)  . HTN (hypertension)   . Hypercholesterolemia   . Mental disorder   . Myocardial infarction ([place])    "I've had 7" (09/17/2017)  . Pulmonary edema   . Respiratory failure ([place])   . Sciatic pain   . Sleep apnea   . Stroke [facility])    a. multiple dating back to 2002; *I''ve had 5; LUE/LLE weaker since" (09/17/2017)  . Tibia fracture (l) leg  . Tobacco abuse   . Type 2 diabetes mellitus ([place])    . Unstable angina (HCC)      (Not in a hospital admission)     Infusions: . lactated ringers 500 mL (05/15/18 2149)    Allergies  Allergen Reactions  . Pork-Derived Products Other (See Comments)    Does not eat for religious reasons - okay with using IV heparin    Social History   Socioeconomic History  . Marital status: Single    Spouse name: Not on file  . Number of children: 1  . Years of education: Not on file  . Highest education level: Not on file  Occupational History  . Not on file  Social Needs  . Financial resource strain: Not on file  . Food insecurity:    Worry: Not on file    Inability: Not on file  . Transportation needs:    Medical: Not on file    Non-medical: Not on file  Tobacco Use  . Smoking status: Former Smoker    Packs/day: 0.50    Years: 20.00    Pack years: 10.00    Types: Cigarettes, Cigars    Start date: 07/21/1977    Last attempt to quit: 10/15/2017    Years since quitting: 0.5  . Smokeless tobacco: Never Used  Substance and Sexual Activity  . Alcohol use: Yes    Comment: 09/17/2017 "nothing since 05/2017"  . Drug use: No    Comment: previously incarcerated for drug related offense.  [name] Kitchen Sexual activity: Not Currently  Lifestyle  . Physical activity:    Days per week: Not on file    Minutes per session: Not on file  . Stress: Not on file  Relationships  . Social connections:    Talks on phone: Not on file    Gets together: Not on file    Attends religious service: Not on file    Active  member of club or organization: Not on file    Attends meetings of clubs or organizations: Not on file    Relationship status: Not on file  . Intimate partner violence:    Fear of current or ex partner: Not on file    Emotionally abused: Not on file    Physically abused: Not on file    Forced sexual activity: Not on file  Other Topics Concern  . Not on file  Social History Narrative  . Not on file    Family History  Problem Relation  Age of Onset  . Stroke Mother   . Heart attack Mother   . Heart attack Father   . Stroke Sister   . Heart attack Sister   . Heart attack Brother   . Stroke Brother   . Liver disease Neg Hx   . Colon cancer Neg Hx     PHYSICAL EXAM: Vitals:   05/15/18 2115 05/15/18 2130  BP: 103/75 93/67  Pulse:    Resp: 16 12  Temp:    SpO2:      No intake or output data in the 24 hours ending 05/15/18 2248  General:  NAD. No respiratory difficulty HEENT: normal Neck: supple. no JVD. Carotids 2+ bilat; no bruits. No lymphadenopathy or thryomegaly appreciated. Cor: PMI nondisplaced. RRR. +summation gallop. 2/6 systolic murmur heard throughout the precordium Lungs: clear Abdomen: soft, mildly TTP in the epigastrum, nondistended. No hepatosplenomegaly. No bruits or masses. Good bowel sounds. Extremities: no cyanosis, clubbing, rash, edema Neuro: alert & oriented x 3, cranial nerves grossly intact. moves all 4 extremities w/o difficulty. Affect pleasant.  ECG: NSR, HR 85, normal axis, normal intervals, anterior Q waves, nonspecific diffuse ST depressions  Results for orders placed or performed during the hospital encounter of 05/15/18 (from the past 24 hour(s))  I-Stat Troponin, ED (not at [facility])     Status: Abnormal   Collection Time: 05/15/18  7:36 PM  Result Value Ref Range   Troponin i, poc 0.11 (HH) 0.00 - 0.08 ng/mL   Comment NOTIFIED PHYSICIAN    Comment 3          CBC with Differential     Status: Abnormal   Collection Time: 05/15/18  7:38 PM  Result Value Ref Range   WBC 11.6 (H) 4.0 - 10.5 K/uL   RBC 6.48 (H) 4.22 - 5.81 MIL/uL   Hemoglobin 16.5 13.0 - 17.0 g/dL   HCT 50.6 39.0 - 52.0 %   MCV 78.1 78.0 - 100.0 fL   MCH 25.5 (L) 26.0 - 34.0 pg   MCHC 32.6 30.0 - 36.0 g/dL   RDW 15.6 (H) 11.5 - 15.5 %   Platelets 276 150 - 400 K/uL   Neutrophils Relative % 68 %   Neutro Abs 7.8 (H) 1.7 - 7.7 K/uL   Lymphocytes Relative 24 %   Lymphs Abs 2.8 0.7 - 4.0 K/uL   Monocytes  Relative 8 %   Monocytes Absolute 0.9 0.1 - 1.0 K/uL   Eosinophils Relative 0 %   Eosinophils Absolute 0.0 0.0 - 0.7 K/uL   Basophils Relative 0 %   Basophils Absolute 0.0 0.0 - 0.1 K/uL   Immature Granulocytes 0 %   Abs Immature Granulocytes 0.0 0.0 - 0.1 K/uL  Comprehensive metabolic panel     Status: Abnormal   Collection Time: 05/15/18  7:38 PM  Result Value Ref Range   Sodium 131 (L) 135 - 145 mmol/L   Potassium 3.6 3.5 - 5.1  mmol/L   Chloride 80 (L) 98 - 111 mmol/L   CO2 35 (H) 22 - 32 mmol/L   Glucose, Bld 158 (H) 70 - 99 mg/dL   BUN 52 (H) 8 - 23 mg/dL   Creatinine, Ser 1.89 (H) 0.61 - 1.24 mg/dL   Calcium 10.0 8.9 - 10.3 mg/dL   Total Protein 8.0 6.5 - 8.1 g/dL   Albumin 4.4 3.5 - 5.0 g/dL   AST 59 (H) 15 - 41 U/L   ALT 29 0 - 44 U/L   Alkaline Phosphatase 56 38 - 126 U/L   Total Bilirubin 0.7 0.3 - 1.2 mg/dL   GFR calc non Af Amer 37 (L) >60 mL/min   GFR calc Af Amer 43 (L) >60 mL/min   Anion gap 16 (H) 5 - 15  Protime-INR     Status: Abnormal   Collection Time: 05/15/18  7:38 PM  Result Value Ref Range   Prothrombin Time 33.2 (H) 11.4 - 15.2 seconds   INR 3.29   Lipase, blood     Status: None   Collection Time: 05/15/18  7:49 PM  Result Value Ref Range   Lipase 38 11 - 51 U/L   Dg Abdomen Acute W/chest  Result Date: 05/15/2018 CLINICAL DATA:  Abdominal pain with nausea and vomiting. EXAM: DG ABDOMEN ACUTE W/ 1V CHEST COMPARISON:  CT abdomen pelvis and chest x-ray dated May 12, 2018. FINDINGS: Unchanged left chest wall pacemaker. Stable cardiomegaly. Normal pulmonary vascularity. No focal consolidation, pleural effusion, or pneumothorax. There is no evidence of dilated bowel loops or free intraperitoneal air. No radiopaque calculi or other significant radiographic abnormality is seen. Unchanged sutures and surgical clips in the left abdomen. No acute osseous abnormality. IMPRESSION: 1. Negative abdominal radiographs. 2.  No active cardiopulmonary disease.  Electronically Signed   By: Titus Dubin M.D.   On: 05/15/2018 20:33    ASSESSMENT: Arlin Sass Stanislawski is a 63 y.o. 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   Cardiology is consulted for NSTEMI.  Patient also found to have AKI. The patient's current symptom profile is somewhat unusual and is very reassuringly not consistent with his prior ischemic chest pain.  His ECG has multiple signs of chronic ischemic heart disease, however with no obvious acute ischemia compared to prior ECGs.  He has ruled in with a positive troponin, which may be due to chronic microvascular disease.  He was however recently stented after having a non-STEMI and April 6948, thus he is certainly at increased risk for subsequent acute coronary events.  PLAN/DISCUSSION: - admit to medicine for abdominal pain workup - recommend workup of AKI (no recent changes to ARB from what I can tell) - recheck troponin and ECG q6h x 2 - give ASA 325mg  x 1 followed by 81mg  daily (for now, although will likely drop aspirin prior to discharge to avoid triple therapy) - give plavix 300mg  x 1 followed by 75mg  daily - do not start heparin given therapeutic INR - hold dose of warfarin tonight - continue carvedilol, ezetimibe, evolocumab, spironolactone - recommend holding digoxin, losartan and glipizide for now given AKI - cardiology will reassess in AM re: next steps in workup  Marcie Mowers, MD Cardiology Fellow, PGY-6

## 2018-05-16 DIAGNOSIS — N179 Acute kidney failure, unspecified: Secondary | ICD-10-CM

## 2018-05-16 DIAGNOSIS — R748 Abnormal levels of other serum enzymes: Secondary | ICD-10-CM | POA: Diagnosis not present

## 2018-05-16 DIAGNOSIS — I110 Hypertensive heart disease with heart failure: Secondary | ICD-10-CM | POA: Diagnosis not present

## 2018-05-16 DIAGNOSIS — R1013 Epigastric pain: Secondary | ICD-10-CM | POA: Diagnosis not present

## 2018-05-16 DIAGNOSIS — R1084 Generalized abdominal pain: Secondary | ICD-10-CM | POA: Diagnosis not present

## 2018-05-16 DIAGNOSIS — K439 Ventral hernia without obstruction or gangrene: Secondary | ICD-10-CM | POA: Diagnosis not present

## 2018-05-16 DIAGNOSIS — R7989 Other specified abnormal findings of blood chemistry: Secondary | ICD-10-CM

## 2018-05-16 DIAGNOSIS — R072 Precordial pain: Secondary | ICD-10-CM | POA: Diagnosis not present

## 2018-05-16 DIAGNOSIS — I2510 Atherosclerotic heart disease of native coronary artery without angina pectoris: Secondary | ICD-10-CM | POA: Diagnosis not present

## 2018-05-16 DIAGNOSIS — R778 Other specified abnormalities of plasma proteins: Secondary | ICD-10-CM

## 2018-05-16 DIAGNOSIS — K2900 Acute gastritis without bleeding: Secondary | ICD-10-CM | POA: Diagnosis not present

## 2018-05-16 LAB — HIV ANTIBODY (ROUTINE TESTING W REFLEX): HIV Screen 4th Generation wRfx: NONREACTIVE

## 2018-05-16 LAB — MAGNESIUM: Magnesium: 2.2 mg/dL (ref 1.7–2.4)

## 2018-05-16 LAB — CBC
HEMATOCRIT: 45.9 % (ref 39.0–52.0)
Hemoglobin: 15 g/dL (ref 13.0–17.0)
MCH: 25.5 pg — ABNORMAL LOW (ref 26.0–34.0)
MCHC: 32.7 g/dL (ref 30.0–36.0)
MCV: 78.1 fL (ref 78.0–100.0)
Platelets: 239 10*3/uL (ref 150–400)
RBC: 5.88 MIL/uL — ABNORMAL HIGH (ref 4.22–5.81)
RDW: 14.8 % (ref 11.5–15.5)
WBC: 9.2 10*3/uL (ref 4.0–10.5)

## 2018-05-16 LAB — LIPID PANEL
Cholesterol: 185 mg/dL (ref 0–200)
HDL: 42 mg/dL (ref >40)
LDL CALC: 102 mg/dL — AB (ref 0–99)
TRIGLYCERIDES: 207 mg/dL — AB (ref <150)
Total CHOL/HDL Ratio: 4.4 RATIO
VLDL: 41 mg/dL — AB (ref 0–40)

## 2018-05-16 LAB — TROPONIN I
Troponin I: 0.09 ng/mL (ref <0.03)
Troponin I: 0.09 ng/mL (ref <0.03)
Troponin I: 0.07 ng/mL (ref <0.03)

## 2018-05-16 LAB — BASIC METABOLIC PANEL
Anion gap: 13 (ref 5–15)
Anion gap: 13 (ref 5–15)
Anion gap: 11 (ref 5–15)
BUN: 39 mg/dL — AB (ref 8–23)
BUN: 36 mg/dL — ABNORMAL HIGH (ref 8–23)
BUN: 34 mg/dL — AB (ref 8–23)
CALCIUM: 9.6 mg/dL (ref 8.9–10.3)
CHLORIDE: 86 mmol/L — AB (ref 98–111)
CO2: 34 mmol/L — ABNORMAL HIGH (ref 22–32)
CO2: 32 mmol/L (ref 22–32)
CO2: 34 mmol/L — ABNORMAL HIGH (ref 22–32)
CREATININE: 1.32 mg/dL — AB (ref 0.61–1.24)
Calcium: 9.4 mg/dL (ref 8.9–10.3)
Calcium: 9.5 mg/dL (ref 8.9–10.3)
Chloride: 84 mmol/L — ABNORMAL LOW (ref 98–111)
Chloride: 87 mmol/L — ABNORMAL LOW (ref 98–111)
Creatinine, Ser: 1.43 mg/dL — ABNORMAL HIGH (ref 0.61–1.24)
Creatinine, Ser: 1.34 mg/dL — ABNORMAL HIGH (ref 0.61–1.24)
GFR calc Af Amer: 60 mL/min — ABNORMAL LOW (ref >60)
GFR calc Af Amer: >60 mL/min (ref >60)
GFR calc Af Amer: >60 mL/min (ref >60)
GFR calc non Af Amer: 57 mL/min — ABNORMAL LOW (ref >60)
GFR calc non Af Amer: 56 mL/min — ABNORMAL LOW (ref >60)
GFR, EST NON AFRICAN AMERICAN: 51 mL/min — AB (ref >60)
GLUCOSE: 117 mg/dL — AB (ref 70–99)
Glucose, Bld: 119 mg/dL — ABNORMAL HIGH (ref 70–99)
Glucose, Bld: 177 mg/dL — ABNORMAL HIGH (ref 70–99)
POTASSIUM: 2.7 mmol/L — AB (ref 3.5–5.1)
POTASSIUM: 3.8 mmol/L (ref 3.5–5.1)
Potassium: 3.2 mmol/L — ABNORMAL LOW (ref 3.5–5.1)
SODIUM: 132 mmol/L — AB (ref 135–145)
Sodium: 133 mmol/L — ABNORMAL LOW (ref 135–145)
Sodium: 129 mmol/L — ABNORMAL LOW (ref 135–145)

## 2018-05-16 LAB — PHOSPHORUS: Phosphorus: 2.2 mg/dL — ABNORMAL LOW (ref 2.5–4.6)

## 2018-05-16 LAB — HEMOGLOBIN A1C
Hgb A1c MFr Bld: 7.5 % — ABNORMAL HIGH (ref 4.8–5.6)
MEAN PLASMA GLUCOSE: 168.55 mg/dL

## 2018-05-16 LAB — PROTIME-INR
INR: 3.59
PROTHROMBIN TIME: 35.5 s — AB (ref 11.4–15.2)

## 2018-05-16 LAB — GLUCOSE, CAPILLARY
GLUCOSE-CAPILLARY: 111 mg/dL — AB (ref 70–99)
Glucose-Capillary: 108 mg/dL — ABNORMAL HIGH (ref 70–99)
Glucose-Capillary: 115 mg/dL — ABNORMAL HIGH (ref 70–99)
Glucose-Capillary: 190 mg/dL — ABNORMAL HIGH (ref 70–99)
Glucose-Capillary: 166 mg/dL — ABNORMAL HIGH (ref 70–99)

## 2018-05-16 LAB — TSH: TSH: 1.842 u[IU]/mL (ref 0.350–4.500)

## 2018-05-16 LAB — BRAIN NATRIURETIC PEPTIDE: B Natriuretic Peptide: 113.7 pg/mL — ABNORMAL HIGH (ref 0.0–100.0)

## 2018-05-16 MED ORDER — WARFARIN - PHARMACIST DOSING INPATIENT
Freq: Every day | Status: DC
  Administered 2018-05-17: 17:00:00

## 2018-05-16 MED ORDER — PANTOPRAZOLE SODIUM 20 MG PO TBEC
20.0000 mg | DELAYED_RELEASE_TABLET | Freq: Every day | ORAL | Status: DC
  Administered 2018-05-16 – 2018-05-17 (×2): 20 mg via ORAL
  Filled 2018-05-16 (×2): qty 1

## 2018-05-16 MED ORDER — CARVEDILOL 3.125 MG PO TABS
3.1250 mg | ORAL_TABLET | Freq: Two times a day (BID) | ORAL | Status: DC
  Administered 2018-05-16 – 2018-05-17 (×4): 3.125 mg via ORAL
  Filled 2018-05-16 (×4): qty 1

## 2018-05-16 MED ORDER — POTASSIUM CHLORIDE CRYS ER 20 MEQ PO TBCR
40.0000 meq | EXTENDED_RELEASE_TABLET | ORAL | Status: AC
  Administered 2018-05-16 (×3): 40 meq via ORAL
  Filled 2018-05-16 (×3): qty 2

## 2018-05-16 MED ORDER — SPIRONOLACTONE 25 MG PO TABS
25.0000 mg | ORAL_TABLET | Freq: Every evening | ORAL | Status: DC
  Administered 2018-05-16 – 2018-05-17 (×2): 25 mg via ORAL
  Filled 2018-05-16 (×2): qty 1

## 2018-05-16 MED ORDER — CLOPIDOGREL BISULFATE 300 MG PO TABS
300.0000 mg | ORAL_TABLET | Freq: Once | ORAL | Status: DC
  Filled 2018-05-16: qty 1

## 2018-05-16 MED ORDER — ACETAMINOPHEN 650 MG RE SUPP: 650.0000 mg | Freq: Four times a day (QID) | RECTAL | Status: DC | PRN

## 2018-05-16 MED ORDER — INSULIN ASPART 100 UNIT/ML ~~LOC~~ SOLN
0.0000 [IU] | Freq: Three times a day (TID) | SUBCUTANEOUS | Status: DC
  Administered 2018-05-16 – 2018-05-17 (×4): 2 [IU] via SUBCUTANEOUS

## 2018-05-16 MED ORDER — ASPIRIN EC 81 MG PO TBEC
81.0000 mg | DELAYED_RELEASE_TABLET | Freq: Every day | ORAL | Status: DC
  Administered 2018-05-16 – 2018-05-17 (×2): 81 mg via ORAL
  Filled 2018-05-16 (×2): qty 1

## 2018-05-16 MED ORDER — DIGOXIN 125 MCG PO TABS
0.1250 mg | ORAL_TABLET | Freq: Every day | ORAL | Status: DC
Start: 2018-05-16 — End: 2018-05-17
  Administered 2018-05-16 – 2018-05-17 (×2): 0.125 mg via ORAL
  Filled 2018-05-16 (×2): qty 1

## 2018-05-16 MED ORDER — GI COCKTAIL ~~LOC~~: 30.0000 mL | Freq: Two times a day (BID) | ORAL | Status: DC | PRN

## 2018-05-16 MED ORDER — ASPIRIN 325 MG PO TABS: 325.0000 mg | ORAL_TABLET | Freq: Once | ORAL | Status: DC

## 2018-05-16 MED ORDER — EZETIMIBE 10 MG PO TABS
10.0000 mg | ORAL_TABLET | Freq: Every day | ORAL | Status: DC
  Administered 2018-05-16 – 2018-05-17 (×2): 10 mg via ORAL
  Filled 2018-05-16 (×2): qty 1

## 2018-05-16 MED ORDER — ACETAMINOPHEN 325 MG PO TABS
650.0000 mg | ORAL_TABLET | Freq: Four times a day (QID) | ORAL | Status: DC | PRN
  Administered 2018-05-16: 650 mg via ORAL
  Filled 2018-05-16: qty 2

## 2018-05-16 MED ORDER — EVOLOCUMAB 140 MG/ML ~~LOC~~ SOAJ: 1.0000 "pen " | SUBCUTANEOUS | Status: DC

## 2018-05-16 MED ORDER — POLYETHYLENE GLYCOL 3350 17 G PO PACK: 17.0000 g | PACK | Freq: Every day | ORAL | Status: DC | PRN

## 2018-05-16 MED ORDER — CLOPIDOGREL BISULFATE 75 MG PO TABS
75.0000 mg | ORAL_TABLET | Freq: Every day | ORAL | Status: DC
  Administered 2018-05-16 – 2018-05-17 (×2): 75 mg via ORAL
  Filled 2018-05-16 (×2): qty 1

## 2018-05-16 NOTE — Progress Notes (Signed)
Pt arrived to unit accompanied by hospital staff. Pt has no complaints. Pt telemetry applied. Pt's BS 108. Will continue to monitor the pt.

## 2018-05-16 NOTE — Progress Notes (Signed)
ANTICOAGULATION CONSULT NOTE - Initial Consult  Pharmacy Consult for Warfarin Indication: Hx mural thrombus  Allergies  Allergen Reactions  . Pork-Derived Products Other (See Comments)    Does not eat for religious reasons - okay with using IV heparin    Patient Measurements: Height: 5\' 6"  (167.6 cm) Weight: 169 lb 12.8 oz (77 kg) IBW/kg (Calculated) : 63.8  Vital Signs: Temp: 98.1 F (36.7 C) (07/19 2203) Temp Source: Oral (07/19 2203) BP: 107/77 (07/19 2202) Pulse Rate: 75 (07/19 2203)  Labs: Recent Labs    05/15/18 1938 05/16/18 0041 05/16/18 0644 05/16/18 1241 05/16/18 1626 05/16/18 2047  HGB 16.5  --  15.0  --   --   --   HCT 50.6  --  45.9  --   --   --   PLT 276  --  239  --   --   --   LABPROT 33.2*  --   --   --   --  35.5*  INR 3.29  --   --   --   --  3.59  CREATININE 1.89*  --  1.43* 1.32* 1.34*  --   TROPONINI  --  0.09* 0.09* 0.07*  --   --     Estimated Creatinine Clearance: 56.6 mL/min (A) (by C-G formula based on SCr of 1.34 mg/dL (H)).   Medical History: Past Medical History:  Diagnosis Date  . AICD (automatic cardioverter/defibrillator) present   . Anxiety   . Arthritis    "all over" (09/17/2017)  . Burn   . CAD (coronary artery disease)    a. NSTEMI s/p BMS to 1st Diagonal and distal OM2 in 2007; b. STEMI 03/26/12 s/p BMS to RCA; c. NSTEMI 10/2012 : CTO of LCx (unable to open with PCI) and PL branch, mod dz of LAD and diagonal, and preserved LV systolic fxn, Med Rx;  d.  anterior STEMI in 01/2017 with DES to Proximal LAD  . Cardiomyopathy EF 35% on cath 06/30/14, new from jan 2015 07/20/2014  . Chest pain   . CHF (congestive heart failure) ([place])   . Chronic back pain    "all over" (09/17/2017)  . DDD (degenerative disc disease), cervical   . Depression   . GERD (gastroesophageal reflux disease)   . Headache    "a few/wk" (09/17/2017)  . HTN (hypertension)   . Hypercholesterolemia   . Mental disorder   . Myocardial infarction ([place])    "I've had 7" (09/17/2017)  . Pulmonary edema   . Respiratory failure ([place])   . Sciatic pain   . Sleep apnea   . Stroke [facility])    a. multiple dating back to 2002; *I''ve had 5; LUE/LLE weaker since" (09/17/2017)  . Tibia fracture (l) leg  . Tobacco abuse   . Type 2 diabetes mellitus ([place])   . Unstable angina (HCC)     Medications:  Home regimen: Warfarin 7.5mg  daily except 5mg  on Wednesday and Sunday.   Assessment: 62 year old male admitted with abdominal pain and chest pain on Warfarin prior to admission for history of LV mural thrombus.   INR today is elevated at 3.59 - supratherapeutic.  Last dose on 05/15/18 at 1700 PM.   Goal of Therapy:  INR 2-3 Monitor platelets by anticoagulation protocol: Yes   Plan:  No Warfarin tonight due to elevated INR.  Daily PT/INR.   Sloan Leiter, PharmD, BCPS, BCCCP Clinical Pharmacist Clinical phone 05/16/2018 until 10:30PM [phone] After hours, please call #[phone] 05/16/2018,10:19 PM

## 2018-05-16 NOTE — Plan of Care (Signed)
  Problem: Education: Goal: Knowledge of General Education information will improve Description Including pain rating scale, medication(s)/side effects and non-pharmacologic comfort measures Outcome: Progressing   Problem: Activity: Goal: Risk for activity intolerance will decrease Outcome: Progressing   Problem: Nutrition: Goal: Adequate nutrition will be maintained Outcome: Progressing   Problem: Coping: Goal: Level of anxiety will decrease Outcome: Progressing   Problem: Elimination: Goal: Will not experience complications related to bowel motility Outcome: Progressing   Problem: Pain Managment: Goal: General experience of comfort will improve Outcome: Progressing   Problem: Safety: Goal: Ability to remain free from injury will improve Outcome: Progressing   Problem: Education: Goal: Knowledge of General Education information will improve Description Including pain rating scale, medication(s)/side effects and non-pharmacologic comfort measures Outcome: Progressing   Problem: Health Behavior/Discharge Planning: Goal: Ability to manage health-related needs will improve Outcome: Progressing   Problem: Clinical Measurements: Goal: Respiratory complications will improve Outcome: Progressing Goal: Cardiovascular complication will be avoided Outcome: Progressing   Problem: Activity: Goal: Risk for activity intolerance will decrease Outcome: Progressing   Problem: Nutrition: Goal: Adequate nutrition will be maintained Outcome: Progressing

## 2018-05-16 NOTE — Progress Notes (Signed)
CRITICAL VALUE ALERT  Critical Value:  Potassium 2.7 Date & Time Notied:  05/16/2018 0904  Provider Notified: MD order supplementation   Orders Received/Actions taken: see orders

## 2018-05-16 NOTE — Progress Notes (Signed)
Family Medicine Teaching Service Daily Progress Note Intern Pager: [phone]  Patient name: Daniel Li Medical record number: [id number] Date of birth: 05-29-1956 Age: 62 y.o. Gender: male  Primary Care Provider: [name], NP Consultants: Cardiology Code Status: DNR  Pt Overview and Major Events to Date:  7/18 - admitted for chest pain  Assessment and Plan: Daniel Li is a 62 y.o. male presenting with abdominal and chest pain. 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  Abdominal/Chest Pain Patient complains of 6/10 mid gastric/epigastric abdominal pain that started this morning and radiates to his chest.  He states that the pain is in the same location of a prior hernia repair in 1995.  Also notes nausea, subjective fever, decreased oral intake.  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  Cardiology consulted in the ED.  EKG showed multiple signs of chronic ischemic heart disease but no obvious signs of acute ischemia.  Reassuring that pain is not consistent with prior ischemic chest pain.  Trops: 1.1(7/18)->0.9 (7/19) -> 0.9 (7/19)-> 0.7 (7/19pm). EKG unchanged from 7/18 - Q waves in V1-V3, ST elevations in V1-V3, minor changes from 7/9 EKG. BNP: 113(7/18) from BL 106(03/31/2018).  Lipase: 38, AST: 59, ALT: 29, bili: 0.7, TSH: 1.8 - Cardiology following, appreciate recs, No further ischemic work-up at this time continue Plavix and statin and restart home meds. - Tylenol for abdominal pain PRN - Will hold warfarin tonight - f/u with cardiology outpt  HFrEF Last Echo 02/12/2018, EF 20-25%. Does not appear fluid overloaded on exam with no crackles or LE edema. On carvedilol, spironolactone, digoxin, torsemide, metolazone, losartan at home. - continue home spironolactone - continue home carvedilol - per cards recs, will  restart digoxin,  - holdingtorsemide, metolazone, losartan due to AKI - Strict I's/O's, daily weights  AKI Cr 1.89 on admission, recently 2.09 at cardiology office, baseline ~1.1.  S/p 500cc in ED. Cr: 1.32(7/19).  Creatinine trending down almost back to baseline. - will hold off on more fluids due to EF - Monitor creatinine - avoid nephrotoxic agents  Leg cramps Patient complains of leg cramps, unclear of when this started due to patient being a poor historian.  He states that it usually happens while sitting or laying.  States that the pain worsens while stretching.  Potassium 3.6 on admission. -Likely secondary to dehydration  -Continue to monitor  Leukocytosis WBC 11.6 on 7/18 -> 9.2(7/19).  Patient is afebrile. -Repeat CBC in a.m.  LV Mural Thrombus On warfarin, INR therapeutic. - Hold Warfarin tonight per cards recommendation - Warfarin per pharmacy - INR 3.29(7/18)  T2DM: Well controlled. Last A1c in April 6.7, On glipizide. - holding glipizide due to AKI  - sSSI - monitor CBGs AC/HS  HLD On Ezetimibe, Evolocumab at home - cont home ezetimibe - cont home evolocumab  HTN on carvedilol, spironolactone, digoxin, torsemide, metolazone, losartan at home. - continue home spironolactone - continue home carvedilol - hold torsemide, metolazone, losartan due to AKI - Continue to monitor vitals  FEN/GI: Heart Healthy/ Carb-modified Prophylaxis: SCDs   Disposition: Tele for ACS r/u  Subjective:  Patient is pleasant resting in bed this morning.  He has no new complaints today.  Patient was unaware of his medication history.  He was asked that his previous statin use but he could not recall why it been taken off  statin.  Objective: Temp:  [98.3 F (36.8 C)-98.4 F (36.9 C)] 98.3 F (36.8 C) (07/19 8588) Pulse Rate:  [74-92] 74 (07/19 0614) Resp:  [11-16] 13 (07/18 2345) BP: (91-103)/(49-75) 93/70 (07/19 0614) SpO2:  [91 %-94 %] 92 % (07/19 0614) Weight:  [160 lb  (72.6 kg)-169 lb 12.8 oz (77 kg)] 169 lb 12.8 oz (77 kg) (07/19 5027) Physical Exam: General: Alert and cooperative and appears to be in no acute distress HEENT: Neck non-tender without lymphadenopathy, masses or thyromegaly Cardio: Normal A1 and S2, no S3 or S4. Rhythm is regular. 2/6 murmur.  Pulm: Normal respiratory effort on room air.  crackles in the lower fields.  No wheezing. Abdomen: Bowel sounds normal. Abdomen soft.  Scar from previous ventral hernia repair evident on abdomen.  She was tender to palpation diffusely in his abdomen. Extremities: No peripheral edema. Warm/ well perfused.  Strong radial pulses. Neuro: Cranial nerves grossly intact  Laboratory: Recent Labs  Lab 05/12/18 0509 05/15/18 1938  WBC 10.0 11.6*  HGB 14.3 16.5  HCT 44.6 50.6  PLT 241 276   Recent Labs  Lab 05/12/18 0509 05/14/18 1115 05/15/18 1938  NA 137 133* 131*  K 3.3* 3.7 3.6  CL 92* 82* 80*  CO2 32 36* 35*  BUN 28* 38* 52*  CREATININE 1.71* 2.09* 1.89*  CALCIUM 9.3 10.5* 10.0  PROT  --   --  8.0  BILITOT  --   --  0.7  ALKPHOS  --   --  56  ALT  --   --  29  AST  --   --  59*  GLUCOSE 186* 235* 158*    No results found for this or any previous visit (from the past 240 hour(s)).   Imaging/Diagnostic Tests: Dg Abdomen Acute W/chest  Result Date: 05/15/2018 CLINICAL DATA:  Abdominal pain with nausea and vomiting. EXAM: DG ABDOMEN ACUTE W/ 1V CHEST COMPARISON:  CT abdomen pelvis and chest x-ray dated May 12, 2018. FINDINGS: Unchanged left chest wall pacemaker. Stable cardiomegaly. Normal pulmonary vascularity. No focal consolidation, pleural effusion, or pneumothorax. There is no evidence of dilated bowel loops or free intraperitoneal air. No radiopaque calculi or other significant radiographic abnormality is seen. Unchanged sutures and surgical clips in the left abdomen. No acute osseous abnormality. IMPRESSION: 1. Negative abdominal radiographs. 2.  No active cardiopulmonary disease.  Electronically Signed   By: Titus Dubin M.D.   On: 05/15/2018 20:33     Matilde Haymaker, MD 05/16/2018, 6:23 AM PGY-1, [place] Intern pager: [phone], text pages welcome

## 2018-05-16 NOTE — Progress Notes (Signed)
Progress Note  Patient Name: Daniel Li Date of Encounter: 05/16/2018  Primary Cardiologist: Carlyle Dolly, MD   Subjective   Patient reports feeling better this morning. Denies chest pain or SOB. Was able to tolerate lunch without recurrence of abdominal pain.   Inpatient Medications    Scheduled Meds: . aspirin EC  81 mg Oral Daily  . aspirin  325 mg Oral Once  . carvedilol  3.125 mg Oral BID WC  . clopidogrel  300 mg Oral Once  . clopidogrel  75 mg Oral Daily  . [START ON 05/29/2018] Evolocumab  1 pen Subcutaneous Q14 Days  . ezetimibe  10 mg Oral Daily  . insulin aspart  0-9 Units Subcutaneous TID WC  . pantoprazole  20 mg Oral Daily  . potassium chloride  40 mEq Oral Q2H  . spironolactone  25 mg Oral QPM   Continuous Infusions:  PRN Meds: acetaminophen **OR** acetaminophen, gi cocktail, polyethylene glycol   Vital Signs    Vitals:   05/15/18 2345 05/16/18 0059 05/16/18 0614 05/16/18 0846  BP: 95/75 (!) 102/49 93/70 105/65  Pulse:  79 74   Resp: 13     Temp:  98.4 F (36.9 C) 98.3 F (36.8 C)   TempSrc:  Oral Oral   SpO2:  94% 92%   Weight:   169 lb 12.8 oz (77 kg)   Height:        Intake/Output Summary (Last 24 hours) at 05/16/2018 1253 Last data filed at 05/16/2018 1032 Gross per 24 hour  Intake 860 ml  Output -  Net 860 ml   Filed Weights   05/15/18 1935 05/16/18 0614  Weight: 160 lb (72.6 kg) 169 lb 12.8 oz (77 kg)    Telemetry    Sinus rhythm with occasional PVCs - Personally Reviewed  ECG    Sinus rhythm with ST-T wave abnormalities c/w prior EKGs - Personally Reviewed  Physical Exam   GEN: Laying in bed in no acute distress.   Neck: No JVD, no carotid bruits Cardiac: RRR, no murmurs, rubs, or gallops. +chest wall TTP Respiratory: Clear to auscultation bilaterally, with mild crackles at lung bases; no wheezes or rhonchi GI: NABS, Soft, nontender, non-distended  MS: No edema; No deformity. Neuro:  Nonfocal, moving all  extremities spontaneously Psych: Normal affect   Labs    Chemistry Recent Labs  Lab 05/14/18 1115 05/15/18 1938 05/16/18 0644  NA 133* 131* 133*  K 3.7 3.6 2.7*  CL 82* 80* 86*  CO2 36* 35* 34*  GLUCOSE 235* 158* 119*  BUN 38* 52* 39*  CREATININE 2.09* 1.89* 1.43*  CALCIUM 10.5* 10.0 9.4  PROT  --  8.0  --   ALBUMIN  --  4.4  --   AST  --  59*  --   ALT  --  29  --   ALKPHOS  --  56  --   BILITOT  --  0.7  --   GFRNONAA 33* 37* 51*  GFRAA 38* 43* 60*  ANIONGAP 15 16* 13     Hematology Recent Labs  Lab 05/12/18 0509 05/15/18 1938 05/16/18 0644  WBC 10.0 11.6* 9.2  RBC 5.64 6.48* 5.88*  HGB 14.3 16.5 15.0  HCT 44.6 50.6 45.9  MCV 79.1 78.1 78.1  MCH 25.4* 25.5* 25.5*  MCHC 32.1 32.6 32.7  RDW 15.2 15.6* 14.8  PLT 241 276 239    Cardiac Enzymes Recent Labs  Lab 05/16/18 0041 05/16/18 0644  TROPONINI 0.09* 0.09*  Recent Labs  Lab 05/12/18 0527 05/12/18 0843 05/12/18 1427 05/15/18 1936  TROPIPOC 0.05 0.08 0.06 0.11*     BNP Recent Labs  Lab 05/15/18 1938  BNP 113.7*     DDimer No results for input(s): DDIMER in the last 168 hours.   Radiology    Dg Abdomen Acute W/chest  Result Date: 05/15/2018 CLINICAL DATA:  Abdominal pain with nausea and vomiting. EXAM: DG ABDOMEN ACUTE W/ 1V CHEST COMPARISON:  CT abdomen pelvis and chest x-ray dated May 12, 2018. FINDINGS: Unchanged left chest wall pacemaker. Stable cardiomegaly. Normal pulmonary vascularity. No focal consolidation, pleural effusion, or pneumothorax. There is no evidence of dilated bowel loops or free intraperitoneal air. No radiopaque calculi or other significant radiographic abnormality is seen. Unchanged sutures and surgical clips in the left abdomen. No acute osseous abnormality. IMPRESSION: 1. Negative abdominal radiographs. 2.  No active cardiopulmonary disease. Electronically Signed   By: Titus Dubin M.D.   On: 05/15/2018 20:33    Cardiac Studies   Left heart  catheterization 01/2018:  RPDA lesion is 75% stenosed.  Post Atrio lesion is 100% stenosed.  Ost Cx to Prox Cx lesion is 100% stenosed.  Previously placed Ost LAD to Prox LAD stent (unknown type) is widely patent.  Prox LAD lesion is 50% stenosed.  Previously placed Ost 1st Diag stent (unknown type) is widely patent.  1st Diag lesion is 75% stenosed.  IMPRESSION: Mr. Mckenney's anatomy is unchanged compared to his previous angiogram which I performed a year ago in setting of an anterior STEMI. This proximal LAD is widely patent. 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 He has a total distal PLA branch and a 75% focal proximal PDA. I did not perform left ventriculography obtain left heart pressures because of a known left ventricular. Suspected this was demand ischemia, type II non-STEMI. He has severe LV dysfunction and does have an ICD in place for primary prevention. Continue medical therapy will be recommended. The sheath was removed and a TR band was placed on the right wrist to achieve patient hemostasis. The patient left the lab in stable condition. He can be re-coumadinized for his mural thrombus.   Patient Profile     62 y.o. 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 Cardiology following for elevated troponins  Assessment & Plan    1. Elevated troponin in patient with significant CAD history: patient presented with epigastric pain. Reports pain is not consistent with prior MI's, of which he has had many. Troponin was elevated to 0.11 and trended down. EKG without ischemic changes. Found to have Cr elevated to 2.09 on admission (baseline 1.1). Suspect demand ischemia in the setting of acute on chronic renal insuffiency.  - Do not anticipate further ischemic work-up at this time - Continue plavix and statin  2.  Chronic systolic CHF: no complaints of SOB, orthopnea, or LE edema. With mild crackles at lung bases on exam today. Home torsemide on hold given AKI.  - Resume home torsemide and losartan once Cr stable - Continue coreg, digoxin, and spironolactone - Monitor volume status closely   3. Acute on chronic renal insufficiency stage 3: Cr 2.09 on admission, baseline 1.1. Possible he has not been eating/drinking as usual given abdominal pain for the past several days. Likely this has contributed to #1. Cr improved to 1.43 today after receiving IVFs. -  Continue to monitor closely - Caution with fluids given reduced EF  4. Hypokalemia: K 2.7 this morning. Ordered for 40 mEq x3 doses today - Recheck BMET this evening to monitor K closely - goal K>4  5. Epigastric pain: patients primary presenting complaints. Overall improved this morning.  - Continue management per primary team  For questions or updates, please contact [place] Please consult [email or web address] for contact info under Cardiology/STEMI.      Signed, Abigail Butts, PA-C  05/16/2018, 12:53 PM   [phone]

## 2018-05-17 DIAGNOSIS — R1013 Epigastric pain: Secondary | ICD-10-CM

## 2018-05-17 DIAGNOSIS — K439 Ventral hernia without obstruction or gangrene: Secondary | ICD-10-CM | POA: Diagnosis not present

## 2018-05-17 DIAGNOSIS — K2900 Acute gastritis without bleeding: Secondary | ICD-10-CM | POA: Diagnosis not present

## 2018-05-17 DIAGNOSIS — N179 Acute kidney failure, unspecified: Secondary | ICD-10-CM | POA: Diagnosis not present

## 2018-05-17 DIAGNOSIS — K2970 Gastritis, unspecified, without bleeding: Secondary | ICD-10-CM | POA: Diagnosis present

## 2018-05-17 LAB — BASIC METABOLIC PANEL
Anion gap: 9 (ref 5–15)
BUN: 24 mg/dL — ABNORMAL HIGH (ref 8–23)
CHLORIDE: 91 mmol/L — AB (ref 98–111)
CO2: 34 mmol/L — AB (ref 22–32)
CREATININE: 1.24 mg/dL (ref 0.61–1.24)
Calcium: 9.7 mg/dL (ref 8.9–10.3)
GFR calc Af Amer: >60 mL/min (ref >60)
GFR calc non Af Amer: >60 mL/min (ref >60)
Glucose, Bld: 149 mg/dL — ABNORMAL HIGH (ref 70–99)
Potassium: 3.8 mmol/L (ref 3.5–5.1)
SODIUM: 134 mmol/L — AB (ref 135–145)

## 2018-05-17 LAB — CBC
HCT: 45 % (ref 39.0–52.0)
HEMOGLOBIN: 14.7 g/dL (ref 13.0–17.0)
MCH: 25.5 pg — AB (ref 26.0–34.0)
MCHC: 32.7 g/dL (ref 30.0–36.0)
MCV: 78.1 fL (ref 78.0–100.0)
Platelets: 224 10*3/uL (ref 150–400)
RBC: 5.76 MIL/uL (ref 4.22–5.81)
RDW: 14.5 % (ref 11.5–15.5)
WBC: 7.2 10*3/uL (ref 4.0–10.5)

## 2018-05-17 LAB — GLUCOSE, CAPILLARY
Glucose-Capillary: 179 mg/dL — ABNORMAL HIGH (ref 70–99)
Glucose-Capillary: 189 mg/dL — ABNORMAL HIGH (ref 70–99)

## 2018-05-17 LAB — PROTIME-INR
INR: 3.19
PROTHROMBIN TIME: 32.4 s — AB (ref 11.4–15.2)

## 2018-05-17 MED ORDER — LOSARTAN POTASSIUM 25 MG PO TABS
12.5000 mg | ORAL_TABLET | Freq: Every day | ORAL | Status: DC
  Administered 2018-05-17: 12.5 mg via ORAL
  Filled 2018-05-17: qty 1

## 2018-05-17 MED ORDER — WARFARIN SODIUM 5 MG PO TABS
5.0000 mg | ORAL_TABLET | Freq: Once | ORAL | Status: AC
  Administered 2018-05-17: 5 mg via ORAL
  Filled 2018-05-17: qty 1

## 2018-05-17 MED ORDER — METOLAZONE 5 MG PO TABS: 2.5000 mg | ORAL_TABLET | Freq: Every day | ORAL | Status: DC

## 2018-05-17 MED ORDER — PANTOPRAZOLE SODIUM 20 MG PO TBEC: 20.0000 mg | DELAYED_RELEASE_TABLET | Freq: Every day | ORAL | 0 refills | Status: DC

## 2018-05-17 MED ORDER — DIGOXIN 125 MCG PO TABS: 0.1250 mg | ORAL_TABLET | Freq: Every day | ORAL | Status: DC

## 2018-05-17 MED ORDER — TORSEMIDE 20 MG PO TABS
40.0000 mg | ORAL_TABLET | Freq: Two times a day (BID) | ORAL | Status: DC
  Administered 2018-05-17 (×2): 40 mg via ORAL
  Filled 2018-05-17 (×2): qty 2

## 2018-05-17 NOTE — Progress Notes (Signed)
Family Medicine Teaching Service Daily Progress Note Intern Pager: [phone]  Patient name: Daniel Li Medical record number: [id number] Date of birth: [birth year] Age: 62 y.o. Gender: male  Primary Care Provider: [name], NP Consultants: Cardiology Code Status: DNR  Pt Overview and Major Events to Date:  7/18 - admitted for chest pain  Assessment and Plan: Daniel Li is a 62 y.o. male presenting with abdominal and chest pain. 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  Abdominal/Chest Pain: Trops: 0.11>0.9>> 0.7. EKG unchanged x3 with Q waves in V1-V3, ST elevations in V1-V3, minor changes from 7/9 EKG. BNP: 113.  Lipase: 38, AST: 59, ALT: 29, bili: 0.7.  Patient still complaining of chest pain this morning that appears to be more epigastric when questioned.  Reports that it is better.  Patient feels comfortable going home. - Cardiology has signed off - Tylenol for abdominal pain PRN - f/u with cardiology outpt  HFrEF: Last Echo 02/12/2018, EF 20-25%.   Does not appear fluid overloaded on exam with no crackles or LE edema. On carvedilol, spironolactone, digoxin, torsemide, metolazone, losartan at home. - continue home spironolactone, carvedilol, digoxin - holding torsemide, metolazone, losartan due to AKI - Strict I's/O's, daily weights  AKI Cr 1.89 on admission,  baseline ~1.1. Cr 1.24 on 7/20.  Creatinine trending down almost back to baseline.  - Monitor creatinine - avoid nephrotoxic agents  Leg cramps, stable: Patient complains of leg cramps, unclear of when this started due to patient being a poor historian.  He states that it usually happens while sitting or laying.  Patient on gabapentin nightly at home.  Not complaining of leg cramps this morning.  -Continue to monitor  Leukocytosis- resolved WBC 11.6 on 7/18 -> 7.2(7/20).  Patient remains afebrile. -Repeat CBC in a.m.  LV  Mural Thrombus On warfarin, INR therapeutic. - Warfarin per pharmacy - INR 3.19(7/20)  T2DM: Well controlled. Last A1c in April 6.7, On glipizide. - holding glipizide - sSSI - monitor CBGs AC/HS  HLD On Ezetimibe, Evolocumab at home - cont home ezetimibe - cont home evolocumab  HTN on carvedilol, spironolactone, digoxin, torsemide, metolazone, losartan at home.  Normotensive overnight. - continue home spironolactone, carvedilol - Will restart torsemide, metolazone, losartan prior to patient going home - Continue to monitor vitals  FEN/GI: Heart Healthy/ Carb-modified Prophylaxis: SCDs  Disposition: Patient stable for discharge  Subjective:  Patient denies any abdominal pain this morning. Reports some chest pain still, but improved from when he came in and still does not feel like previous MI's. Feels comfortable going home.   Objective: Temp:  [98.1 F (36.7 C)-98.5 F (36.9 C)] 98.1 F (36.7 C) (07/19 2203) Pulse Rate:  [74-81] 75 (07/19 2203) Resp:  [16] 16 (07/19 2203) BP: (93-111)/(65-77) 107/77 (07/19 2202) SpO2:  [92 %-97 %] 97 % (07/19 2203) Weight:  [169 lb 12.8 oz (77 kg)] 169 lb 12.8 oz (77 kg) (07/19 4259) Physical Exam: General: NAD, pleasant Eyes: PERRL, EOMI, no conjunctival pallor or injection ENTM: Moist mucous membranes, no pharyngeal erythema or exudate Neck: Supple, no LAD Cardiovascular: RRR, 2/6 systolic murmur, no LE edema Respiratory: CTA BL, normal work of breathing Gastrointestinal: soft, nontender, nondistended, normoactive BS, reducible ventral hernia present MSK: moves 4 extremities equally Derm: no rashes appreciated Neuro: CN II-XII grossly intact Psych: AOx3  Laboratory: Recent Labs  Lab 05/12/18 0509 05/15/18 1938 05/16/18 0644  WBC 10.0 11.6* 9.2  HGB 14.3  16.5 15.0  HCT 44.6 50.6 45.9  PLT 241 276 239   Recent Labs  Lab 05/15/18 1938 05/16/18 0644 05/16/18 1241 05/16/18 1626  NA 131* 133* 129* 132*  K 3.6 2.7*  3.2* 3.8  CL 80* 86* 84* 87*  CO2 35* 34* 32 34*  BUN 52* 39* 36* 34*  CREATININE 1.89* 1.43* 1.32* 1.34*  CALCIUM 10.0 9.4 9.6 9.5  PROT 8.0  --   --   --   BILITOT 0.7  --   --   --   ALKPHOS 56  --   --   --   ALT 29  --   --   --   AST 59*  --   --   --   GLUCOSE 158* 119* 177* 117*   TSH 1.842 Hemoglobin A1c 7.5  Imaging/Diagnostic Tests: No results found.  [place] Shaneice Barsanti, DO PGY-2, [place] Intern pager: [phone], text pages welcome 05/17/2018, 2:44 AM

## 2018-05-17 NOTE — Progress Notes (Signed)
Patient received discharge information and acknowledged understanding of it. Patient received printed prescription. RN called report to [facility] assisted living. Patient IVs were removed.

## 2018-05-17 NOTE — NC FL2 (Signed)
[place] LEVEL OF CARE SCREENING TOOL     IDENTIFICATION  Patient Name: Daniel Li Demicco Birthdate: 1955/12/21 Sex: male Admission Date (Current Location): 05/15/2018  [facility] and [place] Number:  Herbalist and Address:  [facility], [place] [address] [place], [place] [address]      Provider Number: [id number]  Attending Physician Name and Address:  Zenia Resides, MD  Relative Name and Phone Number:       Current Level of Care: Hospital Recommended Level of Care: [place] Prior Approval Number:    Date Approved/Denied:   PASRR Number:    Discharge Plan: Other (Comment)(Assisted Living Facility)    Current Diagnoses: Patient Active Problem List   Diagnosis Date Noted  . AKI (acute kidney injury) ([place]) 05/16/2018  . Generalized abdominal pain   . Elevated troponin   . Chest pain 04/29/2018  . ICD (implantable cardioverter-defibrillator) in place 03/05/2018  . Anticoagulated on Coumadin 03/05/2018  . NSTEMI (non-ST elevated myocardial infarction) ([place]) 02/12/2018  . Precordial chest pain 02/11/2018  . Chronic systolic CHF (congestive heart failure) ([place]) 02/11/2018  . LV (left ventricular) mural thrombus with acute MI ([place]) 02/26/2017  . Chronic combined systolic (congestive) and diastolic (congestive) heart failure ([place])   . STEMI (ST elevation myocardial infarction) ([place]) 02/21/2017  . Ischemic cardiomyopathy 07/20/2014  . High risk medication use 01/28/2013  . Special screening for malignant neoplasms, colon 01/28/2013  . Hypercholesterolemia   . Tobacco abuse   . Diabetes mellitus type 2 with complications ([place]) 42/59/5638  . Benign essential HTN 03/26/2012  . CAD S/P percutaneous coronary angioplasty 03/26/2012    Orientation RESPIRATION BLADDER Height & Weight     Self, Time, Situation, Place  Normal Continent Weight: 169 lb 9.6 oz (76.9 kg) Height:  5\' 6"  (167.6 cm)  BEHAVIORAL  SYMPTOMS/MOOD NEUROLOGICAL BOWEL NUTRITION STATUS      Continent Diet(Heart Healthy/Carb Modified, thin liquids)  AMBULATORY STATUS COMMUNICATION OF NEEDS Skin   Limited Assist Verbally Normal                       Personal Care Assistance Level of Assistance  Bathing, Feeding, Dressing Bathing Assistance: Limited assistance Feeding assistance: Independent Dressing Assistance: Limited assistance     Functional Limitations Info  Sight, Hearing, Speech Sight Info: Adequate Hearing Info: Adequate Speech Info: Adequate    SPECIAL CARE FACTORS FREQUENCY                       Contractures Contractures Info: Not present    Additional Factors Info  Isolation Precautions, Code Status, Allergies Code Status Info: DNR Allergies Info: Pork-derived products     Isolation Precautions Info: MRSA     Current Medications (05/17/2018):  This is the current hospital active medication listDischarge Medications: TAKE these medications   carvedilol 3.125 MG tablet Commonly known as:  COREG TAKE (1) TABLET BY MOUTH TWICE A DAY WITH MEALS.   clopidogrel 75 MG tablet Commonly known as:  PLAVIX Take 1 tablet (75 mg total) by mouth daily.   digoxin 0.125 MG tablet Commonly known as:  LANOXIN Take 1 tablet (0.125 mg total) by mouth daily.   Evolocumab 140 MG/ML Soaj Commonly known as:  REPATHA SURECLICK Inject 1 pen into the skin every 14 (fourteen) days.   ezetimibe 10 MG tablet Commonly known as:  ZETIA Take 1 tablet (10 mg total) by mouth daily.   gabapentin  100 MG capsule Commonly known as:  NEURONTIN Take 1 capsule (100 mg total) by mouth 2 (two) times daily.   glipiZIDE 10 MG 24 hr tablet Commonly known as:  GLUCOTROL XL Take 1 tablet (10 mg total) by mouth daily.   losartan 25 MG tablet Commonly known as:  COZAAR Take 0.5 tablets (12.5 mg total) by mouth daily.   metolazone 2.5 MG tablet Commonly known as:  ZAROXOLYN Take 2.5 mg by mouth daily. As  needed for weight gain only   nitroGLYCERIN 0.4 MG SL tablet Commonly known as:  NITROSTAT PLACE ONE (1) TABLET UNDER TONGUE EVERY 5 MINUTES UP TO (3) DOSES AS NEEDED FOR CHEST PAIN. IF NO RELIEF, CONTACT MD.   pantoprazole 20 MG tablet Commonly known as:  PROTONIX Take 1 tablet (20 mg total) by mouth daily.   potassium chloride SA 20 MEQ tablet Commonly known as:  K-DUR,KLOR-CON Take 1 tablet (20 mEq total) by mouth daily.   spironolactone 25 MG tablet Commonly known as:  ALDACTONE Take 1 tablet (25 mg total) by mouth every evening.   torsemide 20 MG tablet Commonly known as:  DEMADEX Take 2 tablets (40 mg total) by mouth 2 (two) times daily.   warfarin 5 MG tablet Commonly known as:  COUMADIN Take as directed. If you are unsure how to take this medication, talk to your nurse or doctor. Original instructions:  Hold coumadin tonight then decrease dose to 7.5mg  daily except 5mg  on Sundays and Wednesdays What changed:    how much to take  how to take this  when to take this  additional instructions      Relevant Imaging Results:  Relevant Lab Results:   Additional Information SSN: [id number]  Eileen Stanford, LCSW

## 2018-05-17 NOTE — Discharge Summary (Signed)
[facility] Discharge Summary  Patient name: Daniel Li Medical record number: [id number] Date of birth: June 29, 1956 Age: 62 y.o. Gender: male Date of Admission: 05/15/2018  Date of Discharge: 05/17/2018 Admitting Physician: Zenia Resides, MD  Primary Care Provider: [name], NP Consultants: Cardiology  Indication for Hospitalization: Abdominal Pain  Discharge Diagnoses/Problem List:  Abdominal Pain CAD T2DM HTN HLD Multiple Prior MIs HFrEF, chronic, stable  Disposition: Discharge to SNF  Discharge Condition: Stable  Discharge Exam:  General: NAD, pleasant Eyes: PERRL, EOMI, no conjunctival pallor or injection ENTM: Moist mucous membranes, no pharyngeal erythema or exudate Neck: Supple, no LAD Cardiovascular: RRR, 2/6 systolic murmur, no LE edema Respiratory: CTA BL, normal work of breathing Gastrointestinal: soft, nontender, nondistended, normoactive BS, reducible ventral hernia present MSK: moves 4 extremities equally Derm: no rashes appreciated Neuro: CN II-XII grossly intact Psych: AOx3  Brief Hospital Course:  Patient was admitted for epigastric abdominal pain and mildly elevated troponins. Cardiology was consulted and felt the pain was not cardiac in nature but wanted him admitted for observation ober night. Troponins trended down and no changes were made to his cardiac medicines. Abdominal pain was felt to be related to GERD and the patient was started on pantoprazole. Flexeril was discontinued due to patient's age and anticholinergic properties. The patient was stable for discharge to SNF on 05/17/2018.  Issues for Follow Up:  1. Follow up closely with cardiology. 2. Continue all medications as prescribed for chronic conditions.  Significant Procedures:   Significant Labs and Imaging:  Recent Labs  Lab 05/15/18 1938 05/16/18 0644 05/17/18 0459  WBC 11.6* 9.2 7.2  HGB 16.5 15.0 14.7  HCT 50.6 45.9 45.0  PLT 276  239 224   Recent Labs  Lab 05/15/18 1938 05/16/18 0644 05/16/18 1241 05/16/18 1626 05/17/18 0459  NA 131* 133* 129* 132* 134*  K 3.6 2.7* 3.2* 3.8 3.8  CL 80* 86* 84* 87* 91*  CO2 35* 34* 32 34* 34*  GLUCOSE 158* 119* 177* 117* 149*  BUN 52* 39* 36* 34* 24*  CREATININE 1.89* 1.43* 1.32* 1.34* 1.24  CALCIUM 10.0 9.4 9.6 9.5 9.7  MG  --   --  2.2  --   --   PHOS  --   --  2.2*  --   --   ALKPHOS 56  --   --   --   --   AST 59*  --   --   --   --   ALT 29  --   --   --   --   ALBUMIN 4.4  --   --   --   --    TSH 1.842 Hemoglobin A1c 7.5  Results/Tests Pending at Time of Discharge: none  Discharge Medications:  Allergies as of 05/17/2018      Reactions   Pork-derived Products Other (See Comments)   Does not eat for religious reasons - okay with using IV heparin      Medication List    STOP taking these medications   acetaminophen 325 MG tablet Commonly known as:  TYLENOL   benzonatate 100 MG capsule Commonly known as:  TESSALON   cyclobenzaprine 5 MG tablet Commonly known as:  FLEXERIL   HYDROcodone-acetaminophen 5-325 MG tablet Commonly known as:  NORCO/VICODIN     TAKE these medications   carvedilol 3.125 MG tablet Commonly known as:  COREG TAKE (1) TABLET BY MOUTH TWICE A DAY WITH MEALS.   clopidogrel 75 MG  tablet Commonly known as:  PLAVIX Take 1 tablet (75 mg total) by mouth daily.   digoxin 0.125 MG tablet Commonly known as:  LANOXIN Take 1 tablet (0.125 mg total) by mouth daily.   Evolocumab 140 MG/ML Soaj Commonly known as:  REPATHA SURECLICK Inject 1 pen into the skin every 14 (fourteen) days.   ezetimibe 10 MG tablet Commonly known as:  ZETIA Take 1 tablet (10 mg total) by mouth daily.   gabapentin 100 MG capsule Commonly known as:  NEURONTIN Take 1 capsule (100 mg total) by mouth 2 (two) times daily.   glipiZIDE 10 MG 24 hr tablet Commonly known as:  GLUCOTROL XL Take 1 tablet (10 mg total) by mouth daily.   losartan 25 MG  tablet Commonly known as:  COZAAR Take 0.5 tablets (12.5 mg total) by mouth daily.   metolazone 2.5 MG tablet Commonly known as:  ZAROXOLYN Take 2.5 mg by mouth daily. As needed for weight gain only   nitroGLYCERIN 0.4 MG SL tablet Commonly known as:  NITROSTAT PLACE ONE (1) TABLET UNDER TONGUE EVERY 5 MINUTES UP TO (3) DOSES AS NEEDED FOR CHEST PAIN. IF NO RELIEF, CONTACT MD.   pantoprazole 20 MG tablet Commonly known as:  PROTONIX Take 1 tablet (20 mg total) by mouth daily.   potassium chloride SA 20 MEQ tablet Commonly known as:  K-DUR,KLOR-CON Take 1 tablet (20 mEq total) by mouth daily.   spironolactone 25 MG tablet Commonly known as:  ALDACTONE Take 1 tablet (25 mg total) by mouth every evening.   torsemide 20 MG tablet Commonly known as:  DEMADEX Take 2 tablets (40 mg total) by mouth 2 (two) times daily.   warfarin 5 MG tablet Commonly known as:  COUMADIN Take as directed. If you are unsure how to take this medication, talk to your nurse or doctor. Original instructions:  Hold coumadin tonight then decrease dose to 7.5mg  daily except 5mg  on Sundays and Wednesdays What changed:    how much to take  how to take this  when to take this  additional instructions       Discharge Instructions: Please refer to Patient Instructions section of EMR for full details.  Patient was counseled important signs and symptoms that should prompt return to medical care, changes in medications, dietary instructions, activity restrictions, and follow up appointments.   Follow-Up Appointments: Follow-up Information    [name], NP. Schedule an appointment as soon as possible for a visit in 1 week(s).   Specialty:  Nurse Practitioner Why:  f/u with PCP after discharge Contact information: [place] [address] [phone]        Arnoldo Lenis, MD .   Specialty:  Cardiology Contact information: [place]  [address] [phone]           Daisy Floro, DO 05/17/2018, 3:30 PM PGY-1, [place]
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
## 2018-05-17 NOTE — Clinical Social Work Note (Addendum)
CSW called [facility] and confirmed pt is a resident there--facility ok with pt returning today. Facility states pt's ride will not be able to come get him until 7:30 PM. CSW faxed FL2 and D/C summary to fax number [phone])--confirmed. RN please call report to [phone] prior to pt leaving hospital. RN please print d/c summary and sent with pt's d/c paperwork.   Clinical Social Worker will sign off for now as social work intervention is no longer needed. Please consult [place] again if new need arises.   Deandra Gadson A Nola Botkins 05/17/2018.

## 2018-05-19 LAB — GLUCOSE, CAPILLARY
Glucose-Capillary: 171 mg/dL — ABNORMAL HIGH (ref 70–99)
Glucose-Capillary: 184 mg/dL — ABNORMAL HIGH (ref 70–99)

## 2018-05-21 LAB — H. PYLORI ANTIGEN, STOOL: H. Pylori Stool Ag, Eia: NEGATIVE

## 2018-05-24 ENCOUNTER — Emergency Department (HOSPITAL_COMMUNITY): Payer: Medicaid Other

## 2018-05-24 ENCOUNTER — Inpatient Hospital Stay (HOSPITAL_COMMUNITY)
Admission: EM | Admit: 2018-05-24 | Discharge: 2018-05-27 | DRG: 683 | Disposition: A | Discharge status: Home / Self Care | Payer: Medicaid Other | Attending: Internal Medicine | Admitting: Internal Medicine

## 2018-05-24 ENCOUNTER — Other Ambulatory Visit: Payer: Self-pay

## 2018-05-24 ENCOUNTER — Encounter (HOSPITAL_COMMUNITY): Payer: Self-pay | Admitting: Emergency Medicine

## 2018-05-24 DIAGNOSIS — K439 Ventral hernia without obstruction or gangrene: Secondary | ICD-10-CM | POA: Diagnosis present

## 2018-05-24 DIAGNOSIS — I5022 Chronic systolic (congestive) heart failure: Secondary | ICD-10-CM | POA: Diagnosis present

## 2018-05-24 DIAGNOSIS — E860 Dehydration: Secondary | ICD-10-CM

## 2018-05-24 DIAGNOSIS — N189 Chronic kidney disease, unspecified: Secondary | ICD-10-CM

## 2018-05-24 DIAGNOSIS — I2510 Atherosclerotic heart disease of native coronary artery without angina pectoris: Secondary | ICD-10-CM | POA: Diagnosis present

## 2018-05-24 DIAGNOSIS — S0093XA Contusion of unspecified part of head, initial encounter: Secondary | ICD-10-CM

## 2018-05-24 DIAGNOSIS — W19XXXA Unspecified fall, initial encounter: Secondary | ICD-10-CM

## 2018-05-24 DIAGNOSIS — N183 Chronic kidney disease, stage 3 (moderate): Secondary | ICD-10-CM | POA: Diagnosis present

## 2018-05-24 DIAGNOSIS — Z7902 Long term (current) use of antithrombotics/antiplatelets: Secondary | ICD-10-CM

## 2018-05-24 DIAGNOSIS — E871 Hypo-osmolality and hyponatremia: Secondary | ICD-10-CM | POA: Diagnosis not present

## 2018-05-24 DIAGNOSIS — Z79899 Other long term (current) drug therapy: Secondary | ICD-10-CM

## 2018-05-24 DIAGNOSIS — N179 Acute kidney failure, unspecified: Principal | ICD-10-CM | POA: Diagnosis present

## 2018-05-24 DIAGNOSIS — I130 Hypertensive heart and chronic kidney disease with heart failure and stage 1 through stage 4 chronic kidney disease, or unspecified chronic kidney disease: Secondary | ICD-10-CM | POA: Diagnosis present

## 2018-05-24 DIAGNOSIS — Z7901 Long term (current) use of anticoagulants: Secondary | ICD-10-CM

## 2018-05-24 DIAGNOSIS — W1809XA Striking against other object with subsequent fall, initial encounter: Secondary | ICD-10-CM | POA: Diagnosis present

## 2018-05-24 DIAGNOSIS — E7800 Pure hypercholesterolemia, unspecified: Secondary | ICD-10-CM | POA: Diagnosis present

## 2018-05-24 DIAGNOSIS — I236 Thrombosis of atrium, auricular appendage, and ventricle as current complications following acute myocardial infarction: Secondary | ICD-10-CM

## 2018-05-24 DIAGNOSIS — I952 Hypotension due to drugs: Secondary | ICD-10-CM | POA: Diagnosis present

## 2018-05-24 DIAGNOSIS — E876 Hypokalemia: Secondary | ICD-10-CM | POA: Diagnosis present

## 2018-05-24 DIAGNOSIS — I255 Ischemic cardiomyopathy: Secondary | ICD-10-CM | POA: Diagnosis present

## 2018-05-24 DIAGNOSIS — E1122 Type 2 diabetes mellitus with diabetic chronic kidney disease: Secondary | ICD-10-CM | POA: Diagnosis present

## 2018-05-24 DIAGNOSIS — K219 Gastro-esophageal reflux disease without esophagitis: Secondary | ICD-10-CM | POA: Diagnosis present

## 2018-05-24 DIAGNOSIS — T502X5A Adverse effect of carbonic-anhydrase inhibitors, benzothiadiazides and other diuretics, initial encounter: Secondary | ICD-10-CM | POA: Diagnosis present

## 2018-05-24 DIAGNOSIS — E1121 Type 2 diabetes mellitus with diabetic nephropathy: Secondary | ICD-10-CM | POA: Diagnosis present

## 2018-05-24 DIAGNOSIS — Z8673 Personal history of transient ischemic attack (TIA), and cerebral infarction without residual deficits: Secondary | ICD-10-CM

## 2018-05-24 DIAGNOSIS — R7989 Other specified abnormal findings of blood chemistry: Secondary | ICD-10-CM | POA: Diagnosis present

## 2018-05-24 DIAGNOSIS — R296 Repeated falls: Secondary | ICD-10-CM | POA: Diagnosis present

## 2018-05-24 DIAGNOSIS — T500X5A Adverse effect of mineralocorticoids and their antagonists, initial encounter: Secondary | ICD-10-CM | POA: Diagnosis present

## 2018-05-24 DIAGNOSIS — M1990 Unspecified osteoarthritis, unspecified site: Secondary | ICD-10-CM | POA: Diagnosis present

## 2018-05-24 DIAGNOSIS — G4730 Sleep apnea, unspecified: Secondary | ICD-10-CM | POA: Diagnosis present

## 2018-05-24 DIAGNOSIS — Z7984 Long term (current) use of oral hypoglycemic drugs: Secondary | ICD-10-CM

## 2018-05-24 DIAGNOSIS — Z91018 Allergy to other foods: Secondary | ICD-10-CM

## 2018-05-24 DIAGNOSIS — Z955 Presence of coronary angioplasty implant and graft: Secondary | ICD-10-CM

## 2018-05-24 DIAGNOSIS — Z87891 Personal history of nicotine dependence: Secondary | ICD-10-CM

## 2018-05-24 DIAGNOSIS — Z95810 Presence of automatic (implantable) cardiac defibrillator: Secondary | ICD-10-CM

## 2018-05-24 DIAGNOSIS — I252 Old myocardial infarction: Secondary | ICD-10-CM

## 2018-05-24 LAB — COMPREHENSIVE METABOLIC PANEL WITH GFR
ALT: 24 U/L (ref 0–44)
AST: 41 U/L (ref 15–41)
Albumin: 4.5 g/dL (ref 3.5–5.0)
Alkaline Phosphatase: 53 U/L (ref 38–126)
Anion gap: 15 (ref 5–15)
BUN: 52 mg/dL — ABNORMAL HIGH (ref 8–23)
CO2: 34 mmol/L — ABNORMAL HIGH (ref 22–32)
Calcium: 9.6 mg/dL (ref 8.9–10.3)
Chloride: 75 mmol/L — ABNORMAL LOW (ref 98–111)
Creatinine, Ser: 2 mg/dL — ABNORMAL HIGH (ref 0.61–1.24)
GFR calc Af Amer: 40 mL/min — ABNORMAL LOW
GFR calc non Af Amer: 34 mL/min — ABNORMAL LOW
Glucose, Bld: 154 mg/dL — ABNORMAL HIGH (ref 70–99)
Potassium: 3.4 mmol/L — ABNORMAL LOW (ref 3.5–5.1)
Sodium: 124 mmol/L — ABNORMAL LOW (ref 135–145)
Total Bilirubin: 1.2 mg/dL (ref 0.3–1.2)
Total Protein: 7.8 g/dL (ref 6.5–8.1)

## 2018-05-24 LAB — RAPID URINE DRUG SCREEN, HOSP PERFORMED
Amphetamines: NOT DETECTED
Barbiturates: NOT DETECTED
Benzodiazepines: NOT DETECTED
Cocaine: NOT DETECTED
Opiates: NOT DETECTED
Tetrahydrocannabinol: NOT DETECTED

## 2018-05-24 LAB — CBC WITH DIFFERENTIAL/PLATELET
Basophils Absolute: 0 K/uL (ref 0.0–0.1)
Basophils Relative: 0 %
Eosinophils Absolute: 0 K/uL (ref 0.0–0.7)
Eosinophils Relative: 0 %
HCT: 46.2 % (ref 39.0–52.0)
Hemoglobin: 16.5 g/dL (ref 13.0–17.0)
Lymphocytes Relative: 20 %
Lymphs Abs: 2.4 K/uL (ref 0.7–4.0)
MCH: 27.1 pg (ref 26.0–34.0)
MCHC: 35.7 g/dL (ref 30.0–36.0)
MCV: 76 fL — ABNORMAL LOW (ref 78.0–100.0)
Monocytes Absolute: 1 K/uL (ref 0.1–1.0)
Monocytes Relative: 8 %
Neutro Abs: 8.4 K/uL — ABNORMAL HIGH (ref 1.7–7.7)
Neutrophils Relative %: 72 %
Platelets: 270 K/uL (ref 150–400)
RBC: 6.08 MIL/uL — ABNORMAL HIGH (ref 4.22–5.81)
RDW: 13.7 % (ref 11.5–15.5)
WBC: 11.8 K/uL — ABNORMAL HIGH (ref 4.0–10.5)

## 2018-05-24 LAB — URINALYSIS, ROUTINE W REFLEX MICROSCOPIC
Bacteria, UA: NONE SEEN
Bilirubin Urine: NEGATIVE
Glucose, UA: NEGATIVE mg/dL
Hgb urine dipstick: NEGATIVE
Ketones, ur: NEGATIVE mg/dL
Nitrite: NEGATIVE
Protein, ur: NEGATIVE mg/dL
Specific Gravity, Urine: 1.009 (ref 1.005–1.030)
pH: 5 (ref 5.0–8.0)

## 2018-05-24 LAB — DIGOXIN LEVEL: Digoxin Level: 1.1 ng/mL (ref 0.8–2.0)

## 2018-05-24 LAB — AMMONIA: Ammonia: 17 umol/L (ref 9–35)

## 2018-05-24 LAB — PROTIME-INR
INR: 2.2
Prothrombin Time: 24.3 seconds — ABNORMAL HIGH (ref 11.4–15.2)

## 2018-05-24 LAB — TROPONIN I: Troponin I: 0.08 ng/mL (ref <0.03)

## 2018-05-24 LAB — ETHANOL: Alcohol, Ethyl (B): <10 mg/dL

## 2018-05-24 MED ORDER — PANTOPRAZOLE SODIUM 20 MG PO TBEC
20.0000 mg | DELAYED_RELEASE_TABLET | Freq: Every day | ORAL | Status: DC
  Filled 2018-05-24 (×2): qty 1

## 2018-05-24 MED ORDER — SODIUM CHLORIDE 0.9 % IV SOLN
INTRAVENOUS | Status: DC
  Administered 2018-05-25: via INTRAVENOUS

## 2018-05-24 MED ORDER — EZETIMIBE 10 MG PO TABS
10.0000 mg | ORAL_TABLET | Freq: Every day | ORAL | Status: DC
  Administered 2018-05-25 – 2018-05-27 (×3): 10 mg via ORAL
  Filled 2018-05-24 (×3): qty 1

## 2018-05-24 MED ORDER — ONDANSETRON HCL 4 MG PO TABS: 4.0000 mg | ORAL_TABLET | Freq: Four times a day (QID) | ORAL | Status: DC | PRN

## 2018-05-24 MED ORDER — GABAPENTIN 100 MG PO CAPS
100.0000 mg | ORAL_CAPSULE | Freq: Two times a day (BID) | ORAL | Status: DC
  Administered 2018-05-25 – 2018-05-27 (×5): 100 mg via ORAL
  Filled 2018-05-24 (×5): qty 1

## 2018-05-24 MED ORDER — CARVEDILOL 3.125 MG PO TABS
3.1250 mg | ORAL_TABLET | Freq: Two times a day (BID) | ORAL | Status: DC
  Administered 2018-05-25 – 2018-05-26 (×2): 3.125 mg via ORAL
  Filled 2018-05-24 (×6): qty 1

## 2018-05-24 MED ORDER — CLOPIDOGREL BISULFATE 75 MG PO TABS
75.0000 mg | ORAL_TABLET | Freq: Every day | ORAL | Status: DC
  Administered 2018-05-25 – 2018-05-27 (×3): 75 mg via ORAL
  Filled 2018-05-24 (×3): qty 1

## 2018-05-24 MED ORDER — SODIUM CHLORIDE 0.9 % IV BOLUS
1000.0000 mL | Freq: Once | INTRAVENOUS | Status: AC
  Administered 2018-05-24: 1000 mL via INTRAVENOUS

## 2018-05-24 MED ORDER — ONDANSETRON HCL 4 MG/2ML IJ SOLN
4.0000 mg | Freq: Four times a day (QID) | INTRAMUSCULAR | Status: DC | PRN
  Administered 2018-05-25: 4 mg via INTRAVENOUS
  Filled 2018-05-24: qty 2

## 2018-05-24 MED ORDER — POTASSIUM CHLORIDE CRYS ER 20 MEQ PO TBCR
20.0000 meq | EXTENDED_RELEASE_TABLET | Freq: Once | ORAL | Status: AC
  Administered 2018-05-24: 20 meq via ORAL
  Filled 2018-05-24: qty 1

## 2018-05-24 MED ORDER — INSULIN ASPART 100 UNIT/ML ~~LOC~~ SOLN
0.0000 [IU] | Freq: Three times a day (TID) | SUBCUTANEOUS | Status: DC
  Administered 2018-05-25 – 2018-05-26 (×4): 2 [IU] via SUBCUTANEOUS
  Administered 2018-05-26: 1 [IU] via SUBCUTANEOUS
  Administered 2018-05-27: 2 [IU] via SUBCUTANEOUS

## 2018-05-24 MED ORDER — MECLIZINE HCL 12.5 MG PO TABS: 12.5000 mg | ORAL_TABLET | Freq: Three times a day (TID) | ORAL | Status: DC | PRN

## 2018-05-24 NOTE — ED Triage Notes (Signed)
Pt sent from [facility] group home for evaluation of fall.  Pt was in the bathroom and fell back and hit head.  Denies loc.

## 2018-05-24 NOTE — ED Notes (Signed)
Magda Paganini with [employer] called due to interrogation on defib., states that everything looks normal other than "a little dry"
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
## 2018-05-24 NOTE — ED Notes (Signed)
CRITICAL VALUE ALERT  Critical Value:  Troponin 0.08  Date & Time Notied:  05/24/18 & 1929 hrs  Provider Notified: Dr. Jeanell Sparrow  Orders Received/Actions taken: N/A
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
## 2018-05-24 NOTE — ED Notes (Signed)
Pt attempted to give a urine sample at this time and was unsuccessful will try again after some fluids have been given.

## 2018-05-24 NOTE — ED Notes (Signed)
Patient transported to X-ray 

## 2018-05-24 NOTE — ED Notes (Signed)
Pt returned from xray

## 2018-05-25 DIAGNOSIS — Z95810 Presence of automatic (implantable) cardiac defibrillator: Secondary | ICD-10-CM | POA: Diagnosis not present

## 2018-05-25 DIAGNOSIS — I5022 Chronic systolic (congestive) heart failure: Secondary | ICD-10-CM | POA: Diagnosis present

## 2018-05-25 DIAGNOSIS — E860 Dehydration: Secondary | ICD-10-CM

## 2018-05-25 DIAGNOSIS — E1121 Type 2 diabetes mellitus with diabetic nephropathy: Secondary | ICD-10-CM | POA: Diagnosis present

## 2018-05-25 DIAGNOSIS — I255 Ischemic cardiomyopathy: Secondary | ICD-10-CM | POA: Diagnosis present

## 2018-05-25 DIAGNOSIS — K439 Ventral hernia without obstruction or gangrene: Secondary | ICD-10-CM | POA: Diagnosis present

## 2018-05-25 DIAGNOSIS — I2510 Atherosclerotic heart disease of native coronary artery without angina pectoris: Secondary | ICD-10-CM | POA: Diagnosis present

## 2018-05-25 DIAGNOSIS — E1122 Type 2 diabetes mellitus with diabetic chronic kidney disease: Secondary | ICD-10-CM | POA: Diagnosis present

## 2018-05-25 DIAGNOSIS — K219 Gastro-esophageal reflux disease without esophagitis: Secondary | ICD-10-CM | POA: Diagnosis present

## 2018-05-25 DIAGNOSIS — M1990 Unspecified osteoarthritis, unspecified site: Secondary | ICD-10-CM | POA: Diagnosis present

## 2018-05-25 DIAGNOSIS — I952 Hypotension due to drugs: Secondary | ICD-10-CM | POA: Diagnosis present

## 2018-05-25 DIAGNOSIS — N189 Chronic kidney disease, unspecified: Secondary | ICD-10-CM | POA: Diagnosis present

## 2018-05-25 DIAGNOSIS — R296 Repeated falls: Secondary | ICD-10-CM | POA: Diagnosis present

## 2018-05-25 DIAGNOSIS — Z87891 Personal history of nicotine dependence: Secondary | ICD-10-CM | POA: Diagnosis not present

## 2018-05-25 DIAGNOSIS — E7800 Pure hypercholesterolemia, unspecified: Secondary | ICD-10-CM | POA: Diagnosis present

## 2018-05-25 DIAGNOSIS — N179 Acute kidney failure, unspecified: Secondary | ICD-10-CM | POA: Diagnosis present

## 2018-05-25 DIAGNOSIS — N183 Chronic kidney disease, stage 3 (moderate): Secondary | ICD-10-CM

## 2018-05-25 DIAGNOSIS — R7989 Other specified abnormal findings of blood chemistry: Secondary | ICD-10-CM | POA: Diagnosis present

## 2018-05-25 DIAGNOSIS — T500X5A Adverse effect of mineralocorticoids and their antagonists, initial encounter: Secondary | ICD-10-CM | POA: Diagnosis present

## 2018-05-25 DIAGNOSIS — I130 Hypertensive heart and chronic kidney disease with heart failure and stage 1 through stage 4 chronic kidney disease, or unspecified chronic kidney disease: Secondary | ICD-10-CM | POA: Diagnosis present

## 2018-05-25 DIAGNOSIS — I252 Old myocardial infarction: Secondary | ICD-10-CM | POA: Diagnosis not present

## 2018-05-25 DIAGNOSIS — R748 Abnormal levels of other serum enzymes: Secondary | ICD-10-CM

## 2018-05-25 DIAGNOSIS — E876 Hypokalemia: Secondary | ICD-10-CM | POA: Diagnosis present

## 2018-05-25 DIAGNOSIS — T502X5A Adverse effect of carbonic-anhydrase inhibitors, benzothiadiazides and other diuretics, initial encounter: Secondary | ICD-10-CM | POA: Diagnosis present

## 2018-05-25 DIAGNOSIS — G4730 Sleep apnea, unspecified: Secondary | ICD-10-CM | POA: Diagnosis present

## 2018-05-25 DIAGNOSIS — E871 Hypo-osmolality and hyponatremia: Secondary | ICD-10-CM | POA: Diagnosis present

## 2018-05-25 DIAGNOSIS — W1809XA Striking against other object with subsequent fall, initial encounter: Secondary | ICD-10-CM | POA: Diagnosis present

## 2018-05-25 LAB — CBC
HCT: 43.2 % (ref 39.0–52.0)
HEMOGLOBIN: 15.1 g/dL (ref 13.0–17.0)
MCH: 26.6 pg (ref 26.0–34.0)
MCHC: 35 g/dL (ref 30.0–36.0)
MCV: 76.1 fL — ABNORMAL LOW (ref 78.0–100.0)
Platelets: 271 10*3/uL (ref 150–400)
RBC: 5.68 MIL/uL (ref 4.22–5.81)
RDW: 13.8 % (ref 11.5–15.5)
WBC: 13.8 10*3/uL — AB (ref 4.0–10.5)

## 2018-05-25 LAB — COMPREHENSIVE METABOLIC PANEL
ALBUMIN: 4 g/dL (ref 3.5–5.0)
ALK PHOS: 49 U/L (ref 38–126)
ALT: 23 U/L (ref 0–44)
AST: 35 U/L (ref 15–41)
Anion gap: 12 (ref 5–15)
BUN: 48 mg/dL — ABNORMAL HIGH (ref 8–23)
CO2: 36 mmol/L — AB (ref 22–32)
Calcium: 9.1 mg/dL (ref 8.9–10.3)
Chloride: 79 mmol/L — ABNORMAL LOW (ref 98–111)
Creatinine, Ser: 1.6 mg/dL — ABNORMAL HIGH (ref 0.61–1.24)
GFR calc Af Amer: 52 mL/min — ABNORMAL LOW (ref >60)
GFR calc non Af Amer: 45 mL/min — ABNORMAL LOW (ref >60)
GLUCOSE: 136 mg/dL — AB (ref 70–99)
Potassium: 2.7 mmol/L — CL (ref 3.5–5.1)
SODIUM: 127 mmol/L — AB (ref 135–145)
Total Bilirubin: 0.7 mg/dL (ref 0.3–1.2)
Total Protein: 7.1 g/dL (ref 6.5–8.1)

## 2018-05-25 LAB — HEMOGLOBIN A1C
Hgb A1c MFr Bld: 7.7 % — ABNORMAL HIGH (ref 4.8–5.6)
Mean Plasma Glucose: 174.29 mg/dL

## 2018-05-25 LAB — GLUCOSE, CAPILLARY
GLUCOSE-CAPILLARY: 177 mg/dL — AB (ref 70–99)
GLUCOSE-CAPILLARY: 145 mg/dL — AB (ref 70–99)
Glucose-Capillary: 156 mg/dL — ABNORMAL HIGH (ref 70–99)
Glucose-Capillary: 177 mg/dL — ABNORMAL HIGH (ref 70–99)

## 2018-05-25 LAB — TROPONIN I
TROPONIN I: 0.07 ng/mL — AB (ref <0.03)
TROPONIN I: 0.06 ng/mL — AB (ref <0.03)
Troponin I: 0.08 ng/mL (ref <0.03)

## 2018-05-25 LAB — MAGNESIUM: MAGNESIUM: 2.6 mg/dL — AB (ref 1.7–2.4)

## 2018-05-25 MED ORDER — WARFARIN SODIUM 5 MG PO TABS
5.0000 mg | ORAL_TABLET | Freq: Once | ORAL | Status: AC
Start: 2018-05-25 — End: 2018-05-25
  Administered 2018-05-25: 5 mg via ORAL
  Filled 2018-05-25: qty 1

## 2018-05-25 MED ORDER — PANTOPRAZOLE SODIUM 40 MG PO TBEC
40.0000 mg | DELAYED_RELEASE_TABLET | Freq: Every day | ORAL | Status: DC
  Administered 2018-05-25: 40 mg via ORAL
  Filled 2018-05-25: qty 1

## 2018-05-25 MED ORDER — ACETAMINOPHEN 325 MG PO TABS
650.0000 mg | ORAL_TABLET | Freq: Four times a day (QID) | ORAL | Status: DC | PRN
  Administered 2018-05-26: 650 mg via ORAL
  Filled 2018-05-25: qty 2

## 2018-05-25 MED ORDER — WARFARIN - PHARMACIST DOSING INPATIENT: Freq: Every day | Status: DC

## 2018-05-25 MED ORDER — POTASSIUM CHLORIDE CRYS ER 20 MEQ PO TBCR
40.0000 meq | EXTENDED_RELEASE_TABLET | Freq: Every day | ORAL | Status: DC
  Administered 2018-05-25 – 2018-05-27 (×3): 40 meq via ORAL
  Filled 2018-05-25 (×3): qty 2

## 2018-05-25 MED ORDER — SODIUM CHLORIDE 0.9 % IV SOLN: INTRAVENOUS | Status: DC

## 2018-05-25 MED ORDER — TRAMADOL HCL 50 MG PO TABS
50.0000 mg | ORAL_TABLET | Freq: Three times a day (TID) | ORAL | Status: DC | PRN
  Administered 2018-05-25 – 2018-05-27 (×3): 50 mg via ORAL
  Filled 2018-05-25 (×3): qty 1

## 2018-05-25 MED ORDER — PANTOPRAZOLE SODIUM 40 MG PO TBEC
40.0000 mg | DELAYED_RELEASE_TABLET | Freq: Two times a day (BID) | ORAL | Status: DC
  Administered 2018-05-25 – 2018-05-27 (×4): 40 mg via ORAL
  Filled 2018-05-25 (×4): qty 1

## 2018-05-25 MED ORDER — POTASSIUM CHLORIDE IN NACL 20-0.9 MEQ/L-% IV SOLN
INTRAVENOUS | Status: DC
  Administered 2018-05-25 – 2018-05-26 (×3): via INTRAVENOUS

## 2018-05-25 MED ORDER — NICOTINE 21 MG/24HR TD PT24
21.0000 mg | MEDICATED_PATCH | Freq: Every day | TRANSDERMAL | Status: DC
Start: 2018-05-25 — End: 2018-05-27
  Administered 2018-05-25 – 2018-05-27 (×3): 21 mg via TRANSDERMAL
  Filled 2018-05-25 (×3): qty 1

## 2018-05-25 NOTE — Progress Notes (Signed)
CRITICAL VALUE ALERT  Critical Value:  Potassium 2.7  Date & Time Notied:  05/25/18  Provider Notified: MADERA  Orders Received/Actions taken:
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
## 2018-05-25 NOTE — Progress Notes (Signed)
PROGRESS NOTE    Daniel Li  LXB:[id number] DOB: 04/24/1956 DOA: 05/24/2018 PCP: [name], NP     Brief Narrative:  62 year old male with past medical history significant for coronary artery disease, multiple myocardial infarction, systolic heart failure (ejection fraction 25%), diabetes mellitus with nephropathy and chronic kidney disease stage III; who presented to the hospital secondary to dizziness and lightheadedness.  Patient also reported decreased appetite and poor p.o. intake.  Found to be orthostatic, dehydrated and with acute on chronic renal failure.  Assessment & Plan: 1-acute on chronic renal failure -Appears to be prerenal in nature given dehydration and poor p.o. intake. -Will continue IV fluids overnight -Follow closely patient volume status given history of CHF. -Continue holding nephrotoxic agents and almost all antihypertensive drugs (continue only low dose coreg) -repeat BMET in am  2-hypokalemia -will replete as needed and follow trend  -continue holding diuretics for now  3-HTN -patient is hypotensive and orthostatic currently  -will continue holding antihypertensive agents and pursued further IVF's resuscitation -heart healthy diet ordered  4-chronic systolic HF/status post ICD placement -patient is dehydrated currently -will hold home antihypertensive and diuretics regimen; except for low dose b-blocker. -continue IVF's -follow daily weight, low sodium diet and strict I's and O's. -cardiology consulted to reassess discharge HF meds; as he would benefit of adjustment on his medications and close follow up.  5-type 2 diabetes with nephropathy -Continue holding oral hypoglycemic agents -Sliding scale insulin and follow CBGs trend.  6-Elevated troponin: -Flat elevation on his troponins without ischemic changes on telemetry or EKG. -Most likely associated with acute on chronic renal failure -will follow rec's from cardiology regarding  medication adjustment  -continue low dose b-blocker and plavix  7-GERD/epigastric pain -most likely associated with uncontrolled GERD due to ongoing ventral hernias. -will adjust PPI to twice a day -advise to follow multiple small meals. -continue PRN analgesics   8-hx of left ventricular thrombus -Continue Coumadin per pharmacy   DVT prophylaxis: Patient chronically on Coumadin Code Status: Code Family Communication: No family at bedside. Disposition Plan: Patient will remain in the hospital, continue IV fluids overnight, replete electrolytes as needed (especially potassium; repeat orthostatic vital signs in a.m. and follow basic metabolic panel in the morning.  Consultants:   Cardiology  Procedures:   See below for x-ray reports.  Antimicrobials:  Anti-infectives (From admission, onward)   None       Subjective: Afebrile, denies chest pain or palpitations.  Reports breathing is at baseline.  Still feeling weak and lightheaded when changing positions.  Also reports decrease appetite and p.o. Intake.  Patient with soft blood pressure and positive orthostatic changes.  Objective: Vitals:   05/24/18 2100 05/24/18 2115 05/24/18 2157 05/25/18 0454  BP: 99/74  93/68 (!) 82/51  Pulse:   (!) 41 80  Resp: 14 (!) 24 18 18   Temp:   98.6 F (37 C) 98.9 F (37.2 C)  TempSrc:   Oral Oral  SpO2:   100% 96%  Weight:   75 kg (165 lb 5.5 oz)   Height:   5' 6.5" (1.689 m)     Intake/Output Summary (Last 24 hours) at 05/25/2018 1425 Last data filed at 05/25/2018 1103 Gross per 24 hour  Intake 2017.5 ml  Output 1000 ml  Net 1017.5 ml   Filed Weights   05/24/18 1715 05/24/18 2157  Weight: 77.1 kg (170 lb) 75 kg (165 lb 5.5 oz)    Examination: General exam: Alert, awake, oriented x 3; denies chest pain  and shortness of breath.  Patient reports some epigastric discomfort, on his shoulders and across shoulders blade pain as well.  Also reported still having decreased appetite  and feeling dizzy when changing positions. Respiratory system: No wheezing, no crackles, good oxygen saturation on room air. Cardiovascular system: No rubs, no gallops, no JVD, rate controlled.   Gastrointestinal system: Abdomen is nondistended, soft and nontender. No organomegaly or masses felt. Normal bowel sounds heard. Central nervous system: Alert and oriented. No focal neurological deficits. Extremities: No edema, no cyanosis, no clubbing. Skin: No rashes, lesions or ulcers Psychiatry: Judgement and insight appear normal. Mood & affect appropriate.   Data Reviewed: I have personally reviewed following labs and imaging studies  CBC: Recent Labs  Lab 05/24/18 1828 05/25/18 0554  WBC 11.8* 13.8*  NEUTROABS 8.4*  --   HGB 16.5 15.1  HCT 46.2 43.2  MCV 76.0* 76.1*  PLT 270 301   Basic Metabolic Panel: Recent Labs  Lab 05/24/18 1828 05/25/18 0554 05/25/18 1012  NA 124* 127*  --   K 3.4* 2.7*  --   CL 75* 79*  --   CO2 34* 36*  --   GLUCOSE 154* 136*  --   BUN 52* 48*  --   CREATININE 2.00* 1.60*  --   CALCIUM 9.6 9.1  --   MG  --   --  2.6*   GFR: Estimated Creatinine Clearance: 44.6 mL/min (A) (by C-G formula based on SCr of 1.6 mg/dL (H)).   Liver Function Tests: Recent Labs  Lab 05/24/18 1828 05/25/18 0554  AST 41 35  ALT 24 23  ALKPHOS 53 49  BILITOT 1.2 0.7  PROT 7.8 7.1  ALBUMIN 4.5 4.0   Recent Labs  Lab 05/24/18 1829  AMMONIA 17   Coagulation Profile: Recent Labs  Lab 05/24/18 1828  INR 2.20   Cardiac Enzymes: Recent Labs  Lab 05/24/18 1829 05/24/18 2304 05/25/18 0554 05/25/18 1012  TROPONINI 0.08* 0.08* 0.07* 0.06*   HbA1C: Recent Labs    05/24/18 2304  HGBA1C 7.7*   CBG: Recent Labs  Lab 05/25/18 0731 05/25/18 1119  GLUCAP 156* 177*   Urine analysis:    Component Value Date/Time   COLORURINE YELLOW 05/24/2018 2005   APPEARANCEUR CLEAR 05/24/2018 2005   LABSPEC 1.009 05/24/2018 2005   PHURINE 5.0 05/24/2018 2005    GLUCOSEU NEGATIVE 05/24/2018 2005   HGBUR NEGATIVE 05/24/2018 2005   [place] NEGATIVE 05/24/2018 2005   [place] NEGATIVE 05/24/2018 2005   PROTEINUR NEGATIVE 05/24/2018 2005   NITRITE NEGATIVE 05/24/2018 2005   LEUKOCYTESUR SMALL (A) 05/24/2018 2005   Radiology Studies: Dg Chest 1 View  Result Date: 05/24/2018 CLINICAL DATA:  Weakness.  Fall, hit back and head EXAM: CHEST  1 VIEW COMPARISON:  05/15/2018 FINDINGS: Left AICD remains in place, unchanged. Heart is borderline in size. Lungs clear. No effusions or acute bony abnormality. IMPRESSION: No active disease. Electronically Signed   By: Rolm Baptise M.D.   On: 05/24/2018 18:27   Ct Head Wo Contrast  Result Date: 05/24/2018 CLINICAL DATA:  62 year old male with fall and head injury today. EXAM: CT HEAD WITHOUT CONTRAST TECHNIQUE: Contiguous axial images were obtained from the base of the skull through the vertex without intravenous contrast. COMPARISON:  10/15/2017 CT and prior studies FINDINGS: Brain: No evidence of acute infarction, hemorrhage, hydrocephalus, extra-axial collection or mass lesion/mass effect. Atrophy, chronic small-vessel white matter ischemic changes and remote RIGHT MCA and LEFT parietal infarcts noted. Vascular: Atherosclerotic calcifications noted. Skull:  Normal. Negative for fracture or focal lesion. Sinuses/Orbits: No acute finding. Other: None. IMPRESSION: 1. No evidence of acute intracranial abnormality 2. Atrophy, chronic small-vessel white matter ischemic changes and remote infarcts Electronically Signed   By: Margarette Canada M.D.   On: 05/24/2018 18:30    Scheduled Meds: . carvedilol  3.125 mg Oral BID WC  . clopidogrel  75 mg Oral Daily  . ezetimibe  10 mg Oral Daily  . gabapentin  100 mg Oral BID  . insulin aspart  0-9 Units Subcutaneous TID WC  . pantoprazole  40 mg Oral BID  . potassium chloride  40 mEq Oral Daily  . warfarin  5 mg Oral Once  . Warfarin - Pharmacist Dosing Inpatient   Does not apply  q1800   Continuous Infusions: . 0.9 % NaCl with KCl 20 mEq / L 50 mL/hr at 05/25/18 1103     LOS: 0 days    Time spent: 35 minutes   Barton Dubois, MD [facility] Pager [phone]  If 7PM-7AM, please contact night-coverage [email or web address] Password [email or web address] 05/25/2018, 2:25 PM

## 2018-05-26 LAB — BASIC METABOLIC PANEL
Anion gap: 11 (ref 5–15)
BUN: 26 mg/dL — ABNORMAL HIGH (ref 8–23)
CO2: 33 mmol/L — ABNORMAL HIGH (ref 22–32)
Calcium: 8.8 mg/dL — ABNORMAL LOW (ref 8.9–10.3)
Chloride: 81 mmol/L — ABNORMAL LOW (ref 98–111)
Creatinine, Ser: 1.18 mg/dL (ref 0.61–1.24)
GFR calc Af Amer: >60 mL/min (ref >60)
GFR calc non Af Amer: >60 mL/min (ref >60)
Glucose, Bld: 203 mg/dL — ABNORMAL HIGH (ref 70–99)
Potassium: 3.1 mmol/L — ABNORMAL LOW (ref 3.5–5.1)
SODIUM: 125 mmol/L — AB (ref 135–145)

## 2018-05-26 LAB — PROTIME-INR
INR: 2.7
Prothrombin Time: 28.5 seconds — ABNORMAL HIGH (ref 11.4–15.2)

## 2018-05-26 LAB — GLUCOSE, CAPILLARY
GLUCOSE-CAPILLARY: 139 mg/dL — AB (ref 70–99)
GLUCOSE-CAPILLARY: 183 mg/dL — AB (ref 70–99)
GLUCOSE-CAPILLARY: 90 mg/dL (ref 70–99)
Glucose-Capillary: 150 mg/dL — ABNORMAL HIGH (ref 70–99)
Glucose-Capillary: 214 mg/dL — ABNORMAL HIGH (ref 70–99)

## 2018-05-26 LAB — MAGNESIUM: Magnesium: 2.4 mg/dL (ref 1.7–2.4)

## 2018-05-26 MED ORDER — POTASSIUM CHLORIDE CRYS ER 20 MEQ PO TBCR
40.0000 meq | EXTENDED_RELEASE_TABLET | Freq: Every day | ORAL | Status: DC
  Administered 2018-05-26 – 2018-05-27 (×2): 40 meq via ORAL
  Filled 2018-05-26 (×2): qty 2

## 2018-05-26 MED ORDER — WARFARIN SODIUM 5 MG PO TABS
5.0000 mg | ORAL_TABLET | Freq: Once | ORAL | Status: AC
Start: 2018-05-26 — End: 2018-05-26
  Administered 2018-05-26: 5 mg via ORAL
  Filled 2018-05-26: qty 1

## 2018-05-26 MED ORDER — DIGOXIN 125 MCG PO TABS
0.1250 mg | ORAL_TABLET | Freq: Every day | ORAL | Status: DC
  Administered 2018-05-26 – 2018-05-27 (×2): 0.125 mg via ORAL
  Filled 2018-05-26 (×2): qty 1

## 2018-05-26 NOTE — Progress Notes (Signed)
PROGRESS NOTE    Daniel Li  [id number]:[id number] DOB: 1956-03-12 DOA: 05/24/2018 PCP: [name], NP     Brief Narrative:  62 year old male with past medical history significant for coronary artery disease, multiple myocardial infarction, systolic heart failure (ejection fraction 25%), diabetes mellitus with nephropathy and chronic kidney disease stage III; who presented to the hospital secondary to dizziness and lightheadedness.  Patient also reported decreased appetite and poor p.o. intake.  Found to be orthostatic, dehydrated and with acute on chronic renal failure.  Assessment & Plan: 1-acute on chronic renal failure -Appears to be prerenal in nature given dehydration and poor p.o. intake. -Will continue IV fluids overnight -Follow closely patient volume status given history of CHF. -Continue holding nephrotoxic agents and almost all antihypertensive drugs (continue only low dose coreg) -repeat BMET in am  2-hypokalemia -will replete as needed and follow trend  -continue holding diuretics for now  3-HTN -patient is hypotensive and orthostatic currently  -will continue holding antihypertensive agents and pursued further IVF's resuscitation -heart healthy diet ordered  4-chronic systolic HF/status post ICD placement -patient is dehydrated currently -will hold home antihypertensive and diuretics regimen; except for low dose b-blocker. -continue IVF's -follow daily weight, low sodium diet and strict I's and O's. -cardiology consulted to reassess discharge HF meds; as he would benefit of adjustment on his medications and close follow up. -will resume digoxin   5-type 2 diabetes with nephropathy -Continue holding oral hypoglycemic agents -Sliding scale insulin and follow CBGs trend.  6-Elevated troponin: -Flat elevation on his troponins without ischemic changes on telemetry or EKG. -Most likely associated with acute on chronic renal failure -will follow rec's from  cardiology regarding medication adjustment  -continue low dose b-blocker and plavix  7-GERD/epigastric pain -most likely associated with uncontrolled GERD due to ongoing ventral hernias. -will adjust PPI to twice a day -advise to follow multiple small meals. -continue PRN analgesics   8-hx of left ventricular thrombus -Continue Coumadin per pharmacy -cardiology to decide needs of further anticoagulation   DVT prophylaxis: Patient chronically on Coumadin Code Status: Code Family Communication: No family at bedside. Disposition Plan: Patient will remain in the hospital, continue IV fluids overnight, continue to replete electrolytes as needed (especially potassium; repeat orthostatic vital signs in a.m. and follow basic metabolic panel in the morning.  Consultants:   Cardiology  Procedures:   See below for x-ray reports.  Antimicrobials:  Anti-infectives (From admission, onward)   None       Subjective: Afebrile, no chest pain, no palpitations, no shortness of breath.  Still feeling slightly lightheaded when standing and his vital signs were borderline orthostatic.  No nausea no vomiting no abdominal pain reports that he is tolerating diet.  Objective: Vitals:   05/26/18 0823 05/26/18 0827 05/26/18 0829 05/26/18 1137  BP: 112/82 101/79 102/86   Pulse: 79 81 (!) 56 75  Resp: 18 18 18    Temp: 97.7 F (36.5 C) 97.7 F (36.5 C) 97.7 F (36.5 C)   TempSrc: Oral Oral Oral   SpO2: 94%     Weight:      Height:        Intake/Output Summary (Last 24 hours) at 05/26/2018 1337 Last data filed at 05/25/2018 1700 Gross per 24 hour  Intake 240 ml  Output -  Net 240 ml   Filed Weights   05/24/18 1715 05/24/18 2157  Weight: 77.1 kg (170 lb) 75 kg (165 lb 5.5 oz)    Examination: General exam: Alert, awake, oriented x  3; denies chest pain or palpitations.  Patient felt slightly lightheaded this morning when standing.  No nausea, no vomiting, no diarrhea. Respiratory system:  Clear to auscultation. Respiratory effort normal. Cardiovascular system:RRR. No rubs or gallops.  No JVD Gastrointestinal system: Abdomen is nondistended, soft and nontender. No organomegaly or masses felt. Normal bowel sounds heard. Central nervous system: Alert and oriented. No focal neurological deficits. Extremities: No C/C/E, +pedal pulses Skin: No rashes, lesions or ulcers Psychiatry: Judgement and insight appear normal. Mood & affect appropriate.   Data Reviewed: I have personally reviewed following labs and imaging studies  CBC: Recent Labs  Lab 05/24/18 1828 05/25/18 0554  WBC 11.8* 13.8*  NEUTROABS 8.4*  --   HGB 16.5 15.1  HCT 46.2 43.2  MCV 76.0* 76.1*  PLT 270 342   Basic Metabolic Panel: Recent Labs  Lab 05/24/18 1828 05/25/18 0554 05/25/18 1012 05/26/18 0444  NA 124* 127*  --  125*  K 3.4* 2.7*  --  3.1*  CL 75* 79*  --  81*  CO2 34* 36*  --  33*  GLUCOSE 154* 136*  --  203*  BUN 52* 48*  --  26*  CREATININE 2.00* 1.60*  --  1.18  CALCIUM 9.6 9.1  --  8.8*  MG  --   --  2.6* 2.4   GFR: Estimated Creatinine Clearance: 60.4 mL/min (by C-G formula based on SCr of 1.18 mg/dL).   Liver Function Tests: Recent Labs  Lab 05/24/18 1828 05/25/18 0554  AST 41 35  ALT 24 23  ALKPHOS 53 49  BILITOT 1.2 0.7  PROT 7.8 7.1  ALBUMIN 4.5 4.0   Recent Labs  Lab 05/24/18 1829  AMMONIA 17   Coagulation Profile: Recent Labs  Lab 05/24/18 1828 05/26/18 0444  INR 2.20 2.70   Cardiac Enzymes: Recent Labs  Lab 05/24/18 1829 05/24/18 2304 05/25/18 0554 05/25/18 1012  TROPONINI 0.08* 0.08* 0.07* 0.06*   HbA1C: Recent Labs    05/24/18 2304  HGBA1C 7.7*   CBG: Recent Labs  Lab 05/25/18 1119 05/25/18 1641 05/25/18 2215 05/26/18 0735 05/26/18 1059  GLUCAP 177* 177* 145* 139* 183*   Urine analysis:    Component Value Date/Time   COLORURINE YELLOW 05/24/2018 2005   APPEARANCEUR CLEAR 05/24/2018 2005   LABSPEC 1.009 05/24/2018 2005    PHURINE 5.0 05/24/2018 2005   GLUCOSEU NEGATIVE 05/24/2018 2005   HGBUR NEGATIVE 05/24/2018 2005   [place] NEGATIVE 05/24/2018 2005   [place] NEGATIVE 05/24/2018 2005   PROTEINUR NEGATIVE 05/24/2018 2005   NITRITE NEGATIVE 05/24/2018 2005   LEUKOCYTESUR SMALL (A) 05/24/2018 2005   Radiology Studies: Dg Chest 1 View  Result Date: 05/24/2018 CLINICAL DATA:  Weakness.  Fall, hit back and head EXAM: CHEST  1 VIEW COMPARISON:  05/15/2018 FINDINGS: Left AICD remains in place, unchanged. Heart is borderline in size. Lungs clear. No effusions or acute bony abnormality. IMPRESSION: No active disease. Electronically Signed   By: Rolm Baptise M.D.   On: 05/24/2018 18:27   Ct Head Wo Contrast  Result Date: 05/24/2018 CLINICAL DATA:  62 year old male with fall and head injury today. EXAM: CT HEAD WITHOUT CONTRAST TECHNIQUE: Contiguous axial images were obtained from the base of the skull through the vertex without intravenous contrast. COMPARISON:  10/15/2017 CT and prior studies FINDINGS: Brain: No evidence of acute infarction, hemorrhage, hydrocephalus, extra-axial collection or mass lesion/mass effect. Atrophy, chronic small-vessel white matter ischemic changes and remote RIGHT MCA and LEFT parietal infarcts noted. Vascular: Atherosclerotic calcifications noted.  Skull: Normal. Negative for fracture or focal lesion. Sinuses/Orbits: No acute finding. Other: None. IMPRESSION: 1. No evidence of acute intracranial abnormality 2. Atrophy, chronic small-vessel white matter ischemic changes and remote infarcts Electronically Signed   By: Margarette Canada M.D.   On: 05/24/2018 18:30    Scheduled Meds: . carvedilol  3.125 mg Oral BID WC  . clopidogrel  75 mg Oral Daily  . digoxin  0.125 mg Oral Daily  . ezetimibe  10 mg Oral Daily  . gabapentin  100 mg Oral BID  . insulin aspart  0-9 Units Subcutaneous TID WC  . nicotine  21 mg Transdermal Daily  . pantoprazole  40 mg Oral BID  . potassium chloride  40 mEq  Oral Daily  . potassium chloride  40 mEq Oral Daily  . warfarin  5 mg Oral Once  . Warfarin - Pharmacist Dosing Inpatient   Does not apply q1800   Continuous Infusions: . 0.9 % NaCl with KCl 20 mEq / L 50 mL/hr at 05/25/18 2031     LOS: 1 day    Time spent: 30 minutes   Barton Dubois, MD [facility] Pager [phone]  If 7PM-7AM, please contact night-coverage [email or web address] Password [email or web address] 05/26/2018, 1:37 PM

## 2018-05-26 NOTE — Progress Notes (Signed)
ANTICOAGULATION CONSULT NOTE - Initial Consult  Pharmacy Consult for Coumadin Indication: LV thrombus  Allergies  Allergen Reactions  . Pork-Derived Products Other (See Comments)    Does not eat for religious reasons - okay with using IV heparin    Patient Measurements: Height: 5' 6.5" (168.9 cm) Weight: 165 lb 5.5 oz (75 kg) IBW/kg (Calculated) : 64.95  Vital Signs: Temp: 97.7 F (36.5 C) (07/29 0829) Temp Source: Oral (07/29 0829) BP: 102/86 (07/29 0829) Pulse Rate: 56 (07/29 0829)  Labs: Recent Labs    05/24/18 1828  05/24/18 2304 05/25/18 0554 05/25/18 1012 05/26/18 0444  HGB 16.5  --   --  15.1  --   --   HCT 46.2  --   --  43.2  --   --   PLT 270  --   --  271  --   --   LABPROT 24.3*  --   --   --   --  28.5*  INR 2.20  --   --   --   --  2.70  CREATININE 2.00*  --   --  1.60*  --  1.18  TROPONINI  --    < > 0.08* 0.07* 0.06*  --    < > = values in this interval not displayed.    Estimated Creatinine Clearance: 60.4 mL/min (by C-G formula based on SCr of 1.18 mg/dL).   Medical History: Past Medical History:  Diagnosis Date  . AICD (automatic cardioverter/defibrillator) present   . Anxiety   . Arthritis    "all over" (09/17/2017)  . Burn   . CAD (coronary artery disease)    a. NSTEMI s/p BMS to 1st Diagonal and distal OM2 in 2007; b. STEMI 03/26/12 s/p BMS to RCA; c. NSTEMI 10/2012 : CTO of LCx (unable to open with PCI) and PL branch, mod dz of LAD and diagonal, and preserved LV systolic fxn, Med Rx;  d.  anterior STEMI in 01/2017 with DES to Proximal LAD  . Cardiomyopathy EF 35% on cath 06/30/14, new from jan 2015 07/20/2014  . Chest pain   . CHF (congestive heart failure) ([place])   . Chronic back pain    "all over" (09/17/2017)  . DDD (degenerative disc disease), cervical   . Depression   . GERD (gastroesophageal reflux disease)   . Headache    "a few/wk" (09/17/2017)  . HTN (hypertension)   . Hypercholesterolemia   . Mental disorder   . Myocardial  infarction ([place])    "I've had 7" (09/17/2017)  . Pulmonary edema   . Respiratory failure ([place])   . Sciatic pain   . Sleep apnea   . Stroke [facility])    a. multiple dating back to 2002; *I''ve had 5; LUE/LLE weaker since" (09/17/2017)  . Tibia fracture (l) leg  . Tobacco abuse   . Type 2 diabetes mellitus ([place])   . Unstable angina ([facility])     Medications:  Medications Prior to Admission  Medication Sig Dispense Refill Last Dose  . carvedilol (COREG) 3.125 MG tablet TAKE (1) TABLET BY MOUTH TWICE A DAY WITH MEALS. 60 tablet 11 05/24/2018 at Unknown time  . clopidogrel (PLAVIX) 75 MG tablet Take 1 tablet (75 mg total) by mouth daily. 30 tablet 3 05/24/2018 at Unknown time  . digoxin (LANOXIN) 0.125 MG tablet Take 1 tablet (0.125 mg total) by mouth daily. 30 tablet 3 05/24/2018 at Unknown time  . Evolocumab (REPATHA SURECLICK) 720 MG/ML SOAJ Inject 1 pen into the  skin every 14 (fourteen) days. 2 pen 11 Past Week at Unknown time  . ezetimibe (ZETIA) 10 MG tablet Take 1 tablet (10 mg total) by mouth daily. 30 tablet 3 05/24/2018 at Unknown time  . gabapentin (NEURONTIN) 100 MG capsule Take 1 capsule (100 mg total) by mouth 2 (two) times daily. 60 capsule 0 05/24/2018 at Unknown time  . glipiZIDE (GLUCOTROL XL) 10 MG 24 hr tablet Take 1 tablet (10 mg total) by mouth daily. 30 tablet 1 05/24/2018 at Unknown time  . losartan (COZAAR) 25 MG tablet Take 0.5 tablets (12.5 mg total) by mouth daily. 15 tablet 3 05/24/2018 at Unknown time  . metolazone (ZAROXOLYN) 2.5 MG tablet Take 2.5 mg by mouth See admin instructions. Take 1 tablet by mouth daily as needed for weight and fluid gain only   Past Week at Unknown time  . nitroGLYCERIN (NITROSTAT) 0.4 MG SL tablet PLACE ONE (1) TABLET UNDER TONGUE EVERY 5 MINUTES UP TO (3) DOSES AS NEEDED FOR CHEST PAIN. IF NO RELIEF, CONTACT MD. 25 tablet 3 not in past 30 days  . pantoprazole (PROTONIX) 20 MG tablet Take 1 tablet (20 mg total) by mouth daily. 30 tablet 0 05/24/2018 at  Unknown time  . potassium chloride SA (K-DUR,KLOR-CON) 20 MEQ tablet Take 1 tablet (20 mEq total) by mouth daily. 30 tablet 3 05/24/2018 at Unknown time  . spironolactone (ALDACTONE) 25 MG tablet Take 1 tablet (25 mg total) by mouth every evening. 30 tablet 6 05/23/2018 at Unknown time  . torsemide (DEMADEX) 20 MG tablet Take 2 tablets (40 mg total) by mouth 2 (two) times daily. 360 tablet 3 05/24/2018 at Unknown time  . warfarin (COUMADIN) 5 MG tablet Hold coumadin tonight then decrease dose to 7.5mg  daily except 5mg  on Sundays and Wednesdays (Patient taking differently: Take 5-7.5 mg by mouth See admin instructions. 7.5mg  daily except 5mg  on Sundays and Wednesdays) 45 tablet 11 05/24/2018 at Unknown time    Assessment:  62 y.o. male with history of CAD, multiple MIs, stents, systolic CHF, EF 20%, diabetes mellitus came to hospital with chief complaints of dizziness. He is on chronic coumadin for LV mural thrombus. INR therapeutic this but on upper end.  Home regimen:  5mg  on Sun and  Wed, 7.5mg  all other days  Goal of Therapy:  INR 2-3 Monitor platelets by anticoagulation protocol: Yes   Plan:  Coumadin 5mg  po x 1 today PT-INR daily Monitor for S/S of bleeding  Isac Sarna, BS [place], BCPS Clinical Pharmacist Pager [phone] 05/26/2018,9:26 AM
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
## 2018-05-26 NOTE — Clinical Social Work Note (Signed)
Clinical Social Work Assessment  Patient Details  Name: Daniel Li MRN: [id number] Date of Birth: July 01, 1956  Date of referral:  05/26/18               Reason for consult:  Other (Comment Required)(From [facility])                Permission sought to share information with:    Permission granted to share information::     Name::        Agency::  [facility]  Relationship::  sister, Maura Crandall Information:     Housing/Transportation Living arrangements for the past 2 months:  [place] of Information:  Facility, Siblings Patient Interpreter Needed:  None Criminal Activity/Legal Involvement Pertinent to Current Situation/Hospitalization:  No - Comment as needed Significant Relationships:  Siblings Lives with:  Facility Resident Do you feel safe going back to the place where you live?  No Need for family participation in patient care:  Yes (Comment)  Care giving concerns:  None identified. Facility resident.    Social Worker assessment / plan:  Patient has been at [facility] for almost a year. He is continent and mainly independent.  Sometimes, he ambulates with a cane.  Patient can return to the facility at discharge. LCSW spoke with patient's sister, Mardene Celeste, who stated that she was no longer desired to be patient's POA.  Both parties were advised that patient would discharge today.    Employment status:  Retired Forensic scientist:  Medicaid In [place] PT Recommendations:  Not assessed at this time Information / Referral to community resources:     Patient/Family's Response to care:  Family is agreeable to patient returning to the facility.   Patient/Family's Understanding of and Emotional Response to Diagnosis, Current Treatment, and Prognosis:  Patient/family understand that patient's diagnosis, treatment and prognosis.   Emotional Assessment Appearance:  Appears stated age Attitude/Demeanor/Rapport:    Affect (typically  observed):  Unable to Assess Orientation:  Oriented to Self, Oriented to Place, Oriented to  Time, Oriented to Situation Alcohol / Substance use:  Not Applicable Psych involvement (Current and /or in the community):  No (Comment)  Discharge Needs  Concerns to be addressed:  Other (Comment Required(return to facility) Readmission within the last 30 days:  No Current discharge risk:  None Barriers to Discharge:  No Barriers Identified   Ihor Gully, LCSW 05/26/2018, 11:44 AM

## 2018-05-27 DIAGNOSIS — I236 Thrombosis of atrium, auricular appendage, and ventricle as current complications following acute myocardial infarction: Secondary | ICD-10-CM

## 2018-05-27 DIAGNOSIS — K219 Gastro-esophageal reflux disease without esophagitis: Secondary | ICD-10-CM

## 2018-05-27 DIAGNOSIS — E871 Hypo-osmolality and hyponatremia: Secondary | ICD-10-CM

## 2018-05-27 DIAGNOSIS — E860 Dehydration: Secondary | ICD-10-CM

## 2018-05-27 LAB — GLUCOSE, CAPILLARY
GLUCOSE-CAPILLARY: 168 mg/dL — AB (ref 70–99)
Glucose-Capillary: 112 mg/dL — ABNORMAL HIGH (ref 70–99)
Glucose-Capillary: 155 mg/dL — ABNORMAL HIGH (ref 70–99)

## 2018-05-27 LAB — BASIC METABOLIC PANEL
Anion gap: 7 (ref 5–15)
BUN: 16 mg/dL (ref 8–23)
CHLORIDE: 92 mmol/L — AB (ref 98–111)
CO2: 34 mmol/L — ABNORMAL HIGH (ref 22–32)
Calcium: 8.6 mg/dL — ABNORMAL LOW (ref 8.9–10.3)
Creatinine, Ser: 1.08 mg/dL (ref 0.61–1.24)
GFR calc non Af Amer: >60 mL/min (ref >60)
Glucose, Bld: 127 mg/dL — ABNORMAL HIGH (ref 70–99)
POTASSIUM: 4.3 mmol/L (ref 3.5–5.1)
Sodium: 133 mmol/L — ABNORMAL LOW (ref 135–145)

## 2018-05-27 LAB — PROTIME-INR
INR: 2.71
Prothrombin Time: 28.6 seconds — ABNORMAL HIGH (ref 11.4–15.2)

## 2018-05-27 MED ORDER — TORSEMIDE 20 MG PO TABS: 20.0000 mg | ORAL_TABLET | Freq: Every day | ORAL | Status: DC

## 2018-05-27 MED ORDER — WARFARIN SODIUM 7.5 MG PO TABS
7.5000 mg | ORAL_TABLET | Freq: Once | ORAL | Status: DC
  Filled 2018-05-27: qty 1

## 2018-05-27 MED ORDER — NICOTINE 21 MG/24HR TD PT24: 21.0000 mg | MEDICATED_PATCH | Freq: Every day | TRANSDERMAL | 0 refills | Status: DC

## 2018-05-27 MED ORDER — ACETAMINOPHEN 325 MG PO TABS: 650.0000 mg | ORAL_TABLET | Freq: Four times a day (QID) | ORAL | 0 refills | Status: DC | PRN

## 2018-05-27 MED ORDER — MECLIZINE HCL 12.5 MG PO TABS: 12.5000 mg | ORAL_TABLET | Freq: Three times a day (TID) | ORAL | 0 refills | Status: DC | PRN

## 2018-05-27 MED ORDER — PANTOPRAZOLE SODIUM 40 MG PO TBEC: 40.0000 mg | DELAYED_RELEASE_TABLET | Freq: Two times a day (BID) | ORAL | 1 refills | Status: DC

## 2018-05-27 NOTE — Discharge Summary (Signed)
Physician Discharge Summary  Daniel Li Couse ZOX:[id number] DOB: 1956-05-24 DOA: 05/24/2018  PCP: [name], NP  Admit date: 05/24/2018 Discharge date: 05/27/2018  Time spent: 35 minutes  Recommendations for Outpatient Follow-up:  1. Repeat basic metabolic panel to follow electrolytes and renal function 2. Reassess blood pressure and further adjust antihypertensive regimen as needed. 3. Close follow-up with cardiology service to further adjust his medication regimen and decide his further Coumadin therapy is needed.   Discharge Diagnoses:    Post-infarction thrombus of left ventricle (HCC)   Acute on chronic renal failure ([place]); stage 3 at baseline.   Hyponatremia   Gastroesophageal reflux disease   Dehydration with hyponatremia   Discharge Condition: Stable and improved.  Patient discharged back to his assisted living facility for further care and had instructions to follow-up with PCP in 10 days and with cardiology service as an outpatient.  Diet recommendation: Heart healthy diet and modify carbohydrates.  Filed Weights   05/24/18 1715 05/24/18 2157  Weight: 77.1 kg (170 lb) 75 kg (165 lb 5.5 oz)    Brief History of present illness:  62 year old male with past medical history significant for coronary artery disease, multiple myocardial infarction, systolic heart failure (ejection fraction 25%), diabetes mellitus with nephropathy and chronic kidney disease stage III; who presented to the hospital secondary to dizziness and lightheadedness.  Patient also reported decreased appetite and poor p.o. intake.  Found to be orthostatic, dehydrated and with acute on chronic renal failure.  Hospital Course:  1-acute on chronic renal failure -Appears to be prerenal in nature given dehydration and poor p.o. intake. -After fluid resuscitation patient's renal function is back to baseline. -Okay to resume low-dose of losartan and also low dose of Demadex. -We will discontinue  metolazone and Aldactone  -repeat BMET at follow up visit to reassess renal function trend  -Patient advised to keep himself well-hydrated.    2-hypokalemia -repleted  -will continue daily supplementation -some diuretics are discontinued at discharge -repeat BMET at follow up visit to assess electrolytes trend.   3-HTN -patient is hypotensive and orthostatic on admission -antihypertensive agents adjusted. -heart healthy diet encouraged -advise to keep himself well hydrated.   4-chronic systolic HF/status post ICD placement -patient was dehydrated on admission. -volume status compensated now. -will hold metolazone and aldactone at discharge -restart demadex only 20mg  daily on 05/29/18 -follow daily weights and low sodium diet -continue low dose b-blocker and low dose losartan. -continue digoxin. -outpatient follow up with cardiology service arranged.   5-type 2 diabetes with nephropathy -Resume home oral hypoglycemic regimen and encourage modified carbohydrate diet.  6-Elevated troponin: -Flat elevation on his troponins without ischemic changes on telemetry or EKG. -Most likely associated with acute on chronic renal failure -will follow rec's from cardiology regarding medication adjustment  -continue low dose b-blocker and plavix  7-GERD/epigastric pain -most likely associated with uncontrolled GERD due to ongoing ventral hernias. -will adjust PPI to twice a day -advise to follow multiple small meals. -continue Tylenol for PRN analgesics and avoid the use of NSAIDs.  8-hx of left ventricular thrombus -Continue Coumadin  -Cardiology to decide needs of further anticoagulation during upcoming follow up visit. -Most recent echo demonstrated no thrombus on his left ventricle.    Procedures:  See below for x-ray reports.  Consultations:  Cardiology service.  Discharge Exam: Vitals:   05/27/18 0617 05/27/18 1338  BP: 97/73 101/74  Pulse: 64 69  Resp: 18    Temp: 97.7 F (36.5 C)   SpO2: (!) 72%  General exam: Alert, awake, oriented x 3; denies chest pain or palpitations.  Orthostatic vital signs negative and patient denies any further lightheadedness or syncope episode.  No nausea, no vomiting, no diarrhea. Respiratory system: Clear to auscultation. Respiratory effort normal. Cardiovascular system:RRR. No rubs or gallops.  No JVD Gastrointestinal system: Abdomen is nondistended, soft and nontender. No organomegaly or masses felt. Normal bowel sounds heard. Central nervous system: Alert and oriented. No focal neurological deficits. Extremities: No C/C/E, +pedal pulses Skin: No rashes, lesions or ulcers Psychiatry: Judgement and insight appear normal. Mood & affect appropriate.      Discharge Instructions   Discharge Instructions    (HEART FAILURE PATIENTS) Call MD:  Anytime you have any of the following symptoms: 1) 3 pound weight gain in 24 hours or 5 pounds in 1 week 2) shortness of breath, with or without a dry hacking cough 3) swelling in the hands, feet or stomach 4) if you have to sleep on extra pillows at night in order to breathe.   Complete by:  As directed    Diet - low sodium heart healthy   Complete by:  As directed    Discharge instructions   Complete by:  As directed    Follow heart healthy diet and modify carbohydrates. Keep yourself well-hydrated. Take medications as prescribed Follow-up with cardiology service as instructed. Arrange follow-up with PCP in 10 days. Check your weight on daily basis (contact cardiology office with any increment of more than 3 pounds overnight and/or more than 5 pounds in a week).     Allergies as of 05/27/2018      Reactions   Pork-derived Products Other (See Comments)   Does not eat for religious reasons - okay with using IV heparin      Medication List    STOP taking these medications   metolazone 2.5 MG tablet Commonly known as:  ZAROXOLYN   spironolactone 25 MG  tablet Commonly known as:  ALDACTONE     TAKE these medications   acetaminophen 325 MG tablet Commonly known as:  TYLENOL Take 2 tablets (650 mg total) by mouth every 6 (six) hours as needed for mild pain, fever or headache.   carvedilol 3.125 MG tablet Commonly known as:  COREG TAKE (1) TABLET BY MOUTH TWICE A DAY WITH MEALS.   clopidogrel 75 MG tablet Commonly known as:  PLAVIX Take 1 tablet (75 mg total) by mouth daily.   digoxin 0.125 MG tablet Commonly known as:  LANOXIN Take 1 tablet (0.125 mg total) by mouth daily.   Evolocumab 140 MG/ML So[place] Commonly known as:  REPATHA SURECLICK Inject 1 pen into the skin every 14 (fourteen) days.   ezetimibe 10 MG tablet Commonly known as:  ZETIA Take 1 tablet (10 mg total) by mouth daily.   gabapentin 100 MG capsule Commonly known as:  NEURONTIN Take 1 capsule (100 mg total) by mouth 2 (two) times daily.   glipiZIDE 10 MG 24 hr tablet Commonly known as:  GLUCOTROL XL Take 1 tablet (10 mg total) by mouth daily.   losartan 25 MG tablet Commonly known as:  COZAAR Take 0.5 tablets (12.5 mg total) by mouth daily.   meclizine 12.5 MG tablet Commonly known as:  ANTIVERT Take 1 tablet (12.5 mg total) by mouth 3 (three) times daily as needed for dizziness.   nicotine 21 mg/24hr patch Commonly known as:  NICODERM CQ - dosed in mg/24 hours Place 1 patch (21 mg total) onto the skin daily. Start taking  on:  05/28/2018   nitroGLYCERIN 0.4 MG SL tablet Commonly known as:  NITROSTAT PLACE ONE (1) TABLET UNDER TONGUE EVERY 5 MINUTES UP TO (3) DOSES AS NEEDED FOR CHEST PAIN. IF NO RELIEF, CONTACT MD.   pantoprazole 40 MG tablet Commonly known as:  PROTONIX Take 1 tablet (40 mg total) by mouth 2 (two) times daily. What changed:    medication strength  how much to take  when to take this   potassium chloride SA 20 MEQ tablet Commonly known as:  K-DUR,KLOR-CON Take 1 tablet (20 mEq total) by mouth daily.   torsemide 20 MG  tablet Commonly known as:  DEMADEX Take 1 tablet (20 mg total) by mouth daily. Start taking on:  05/29/2018 What changed:    how much to take  when to take this  These instructions start on 05/29/2018. If you are unsure what to do until then, ask your doctor or other care provider.   warfarin 5 MG tablet Commonly known as:  COUMADIN Take as directed. If you are unsure how to take this medication, talk to your nurse or doctor. Original instructions:  Hold coumadin tonight then decrease dose to 7.5mg  daily except 5mg  on Sundays and Wednesdays What changed:    how much to take  how to take this  when to take this  additional instructions      Allergies  Allergen Reactions  . Pork-Derived Products Other (See Comments)    Does not eat for religious reasons - okay with using IV heparin   Follow-up Information    Erma Heritage, PA-C Follow up on 06/25/2018.   Specialties:  Physician Assistant, Cardiology Why:  Cardiology Hospital Follow-Up on 06/25/2018 at 3:30 PM.  Contact information: [place] [address] [phone]        [name], NP. Schedule an appointment as soon as possible for a visit in 10 day(s).   Specialty:  Nurse Practitioner Contact information: [place] [address] [phone]        Arnoldo Lenis, MD .   Specialty:  Cardiology Contact information: [address] [place] [address] [phone]           The results of significant diagnostics from this hospitalization (including imaging, microbiology, ancillary and laboratory) are listed below for reference.    Significant Diagnostic Studies: Dg Chest 1 View  Result Date: 05/24/2018 CLINICAL DATA:  Weakness.  Fall, hit back and head EXAM: CHEST  1 VIEW COMPARISON:  05/15/2018 FINDINGS: Left AICD remains in place, unchanged. Heart is borderline in size. Lungs clear. No effusions or acute bony abnormality. IMPRESSION: No active disease.  Electronically Signed   By: Rolm Baptise M.D.   On: 05/24/2018 18:27   Dg Chest 2 View  Result Date: 05/12/2018 CLINICAL DATA:  Chest pain since yesterday morning. EXAM: CHEST - 2 VIEW COMPARISON:  05/06/2018 and older exams. FINDINGS: Cardiac silhouette is top-normal in size. No mediastinal or hilar masses. No evidence of adenopathy. Clear lungs.  No pleural effusion or pneumothorax. Single lead left anterior chest wall pacemaker is stable. Skeletal structures are intact. IMPRESSION: No active cardiopulmonary disease. Electronically Signed   By: Lajean Manes M.D.   On: 05/12/2018 08:34   Dg Chest 2 View  Result Date: 05/06/2018 CLINICAL DATA:  Right-sided chest pain. Cough and shortness of breath. EXAM: CHEST - 2 VIEW COMPARISON:  Radiograph 04/29/2018 FINDINGS: Single lead left-sided pacemaker remains in place. Mild cardiomegaly is unchanged from prior exam.  No pulmonary edema, focal airspace disease, pleural effusion or pneumothorax. No acute osseous abnormalities. IMPRESSION: Unchanged cardiomegaly without acute chest finding. Electronically Signed   By: Jeb Levering M.D.   On: 05/06/2018 02:18   Ct Head Wo Contrast  Result Date: 05/24/2018 CLINICAL DATA:  62 year old male with fall and head injury today. EXAM: CT HEAD WITHOUT CONTRAST TECHNIQUE: Contiguous axial images were obtained from the base of the skull through the vertex without intravenous contrast. COMPARISON:  10/15/2017 CT and prior studies FINDINGS: Brain: No evidence of acute infarction, hemorrhage, hydrocephalus, extra-axial collection or mass lesion/mass effect. Atrophy, chronic small-vessel white matter ischemic changes and remote RIGHT MCA and LEFT parietal infarcts noted. Vascular: Atherosclerotic calcifications noted. Skull: Normal. Negative for fracture or focal lesion. Sinuses/Orbits: No acute finding. Other: None. IMPRESSION: 1. No evidence of acute intracranial abnormality 2. Atrophy, chronic small-vessel white matter  ischemic changes and remote infarcts Electronically Signed   By: Margarette Canada M.D.   On: 05/24/2018 18:30   Ct L-spine No Charge  Result Date: 05/12/2018 CLINICAL DATA:  Low back pain, right flank pain, and nausea. EXAM: CT LUMBAR SPINE WITHOUT CONTRAST TECHNIQUE: Multidetector CT imaging of the lumbar spine was performed without intravenous contrast administration. Multiplanar CT image reconstructions were also generated. COMPARISON:  10/27/2013 lumbar spine MRI FINDINGS: Segmentation: Mildly transitional lumbosacral anatomy. For consistency with the prior MRI report, this will be considered a partially sacralized L5 on the left with fully formed L5-S1 disc space. Alignment: Mild lumbar dextroscoliosis.  No listhesis. Vertebrae: No fracture or destructive osseous process. Small lucencies/cystic changes posteriorly in the iliac bones bilaterally along the SI joints, likely benign. Paraspinal and other soft tissues: Intra-abdominal contents including an abdominal aortic aneurysm are more fully evaluated on separate abdomen and pelvis CT. Disc levels: Mild disc space narrowing and vacuum disc phenomenon at L1-2, L3-4, and L4-5. T12-L1: Mild left greater than right facet spurring without evidence of significant stenosis. L1-2: Circumferential disc bulging and left greater than right facet arthrosis result in mild spinal stenosis and moderate to severe bilateral neural foraminal stenosis, progressed from prior. L2-3: Circumferential disc bulging greater to the left and left greater than right facet arthrosis result in mild spinal stenosis and mild-to-moderate right and moderate to severe left neural foraminal stenosis, similar to prior. L3-4: Circumferential disc bulging greater to the left, left paracentral disc protrusion, and mild facet arthrosis result in mild spinal stenosis, left greater than right lateral recess stenosis, and mild-to-moderate right and moderate to severe left neural foraminal stenosis, similar  to prior. L4-5: Circumferential disc bulging, superimposed central disc protrusion, and moderate right and mild left facet arthrosis result in mild spinal stenosis, bilateral lateral recess stenosis, and moderate to severe left greater than right neural foraminal stenosis, similar to prior. L5-S1: Similar appearance of right far-lateral osteophyte. No evidence of significant spinal or neural foraminal stenosis. Minimal facet spurring. IMPRESSION: 1. No evidence of acute osseous abnormality. 2. Progressive L1-2 disc degeneration resulting in mild spinal stenosis and moderate to severe bilateral neural foraminal stenosis. 3. Similar appearance of disc and facet degeneration elsewhere resulting in mild spinal and moderate to severe neural foraminal stenosis from L2-3 to L4-5. Electronically Signed   By: Logan Bores M.D.   On: 05/12/2018 12:44   Dg Chest Port 1 View  Result Date: 04/29/2018 CLINICAL DATA:  Mid chest/left-sided pain.  Shortness of breath. EXAM: PORTABLE CHEST 1 VIEW COMPARISON:  03/31/2018. FINDINGS: Cardiac pacer with lead tip over the right ventricle. Cardiomegaly with normal  pulmonary vascularity. No focal infiltrate. No pleural effusion or pneumothorax. No acute bony abnormality. IMPRESSION: 1. Cardiac pacer with lead tip over the right ventricle. Cardiomegaly. No pulmonary venous congestion. 2.  No acute pulmonary disease. Electronically Signed   By: Marcello Moores  Register   On: 04/29/2018 10:53   Dg Abdomen Acute W/chest  Result Date: 05/15/2018 CLINICAL DATA:  Abdominal pain with nausea and vomiting. EXAM: DG ABDOMEN ACUTE W/ 1V CHEST COMPARISON:  CT abdomen pelvis and chest x-ray dated May 12, 2018. FINDINGS: Unchanged left chest wall pacemaker. Stable cardiomegaly. Normal pulmonary vascularity. No focal consolidation, pleural effusion, or pneumothorax. There is no evidence of dilated bowel loops or free intraperitoneal air. No radiopaque calculi or other significant radiographic abnormality  is seen. Unchanged sutures and surgical clips in the left abdomen. No acute osseous abnormality. IMPRESSION: 1. Negative abdominal radiographs. 2.  No active cardiopulmonary disease. Electronically Signed   By: Titus Dubin M.D.   On: 05/15/2018 20:33   Ct Renal Stone Study  Result Date: 05/12/2018 CLINICAL DATA:  Right flank pain, low back pain EXAM: CT ABDOMEN AND PELVIS WITHOUT CONTRAST TECHNIQUE: Multidetector CT imaging of the abdomen and pelvis was performed following the standard protocol without IV contrast. COMPARISON:  None. FINDINGS: Lower chest: Cardiomegaly. Pacer wires noted in the right heart. Dependent atelectasis in the lower lobes. No effusions. Hepatobiliary: No focal hepatic abnormality. Gallbladder unremarkable. Pancreas: No focal abnormality or ductal dilatation. Spleen: No focal abnormality.  Normal size. Adrenals/Urinary Tract: Fullness of the adrenal glands bilaterally, likely hyperplasia. Scarring in the mid and lower poles of the right kidney. No renal or ureteral stones. No hydronephrosis. No visualized renal mass. Urinary bladder is unremarkable. Stomach/Bowel: Stomach, large and small bowel grossly unremarkable. Appendix is normal. Vascular/Lymphatic: Mild infrarenal abdominal aortic aneurysm, 3.5 cm. Aortic and iliac atherosclerosis. Reproductive: No visible focal abnormality. Other: No free fluid or free air. Multiple small midline and left paramidline ventral hernias containing fat. Musculoskeletal: No acute bony abnormality. IMPRESSION: No renal or ureteral stones.  No hydronephrosis. 3.5 cm infrarenal abdominal aortic aneurysm. Recommend followup by ultrasound in 2 years. This recommendation follows ACR consensus guidelines: White Paper of the ACR Incidental Findings Committee II on Vascular Findings. J Am Coll Radiol 2013; 10:789-794. Bilateral adrenal fullness, likely hyperplasia. Cardiomegaly. Multiple small ventral hernias containing fat. Electronically Signed   By:  Rolm Baptise M.D.   On: 05/12/2018 12:30   Labs: Basic Metabolic Panel: Recent Labs  Lab 05/24/18 1828 05/25/18 0554 05/25/18 1012 05/26/18 0444 05/27/18 0425  NA 124* 127*  --  125* 133*  K 3.4* 2.7*  --  3.1* 4.3  CL 75* 79*  --  81* 92*  CO2 34* 36*  --  33* 34*  GLUCOSE 154* 136*  --  203* 127*  BUN 52* 48*  --  26* 16  CREATININE 2.00* 1.60*  --  1.18 1.08  CALCIUM 9.6 9.1  --  8.8* 8.6*  MG  --   --  2.6* 2.4  --    Liver Function Tests: Recent Labs  Lab 05/24/18 1828 05/25/18 0554  AST 41 35  ALT 24 23  ALKPHOS 53 49  BILITOT 1.2 0.7  PROT 7.8 7.1  ALBUMIN 4.5 4.0    Recent Labs  Lab 05/24/18 1829  AMMONIA 17   CBC: Recent Labs  Lab 05/24/18 1828 05/25/18 0554  WBC 11.8* 13.8*  NEUTROABS 8.4*  --   HGB 16.5 15.1  HCT 46.2 43.2  MCV 76.0* 76.1*  PLT 270 271   Cardiac Enzymes: Recent Labs  Lab 05/24/18 1829 05/24/18 2304 05/25/18 0554 05/25/18 1012  TROPONINI 0.08* 0.08* 0.07* 0.06*   BNP: BNP (last 3 results) Recent Labs    03/31/18 0916 05/06/18 0053 05/15/18 1938  BNP 106.0* 172.0* 113.7*   CBG: Recent Labs  Lab 05/26/18 1059 05/26/18 1623 05/26/18 2122 05/27/18 0734 05/27/18 1132  GLUCAP 183* 90 214* 112* 168*    Signed:  Barton Dubois MD.  [facility] 05/27/2018, 2:48 PM

## 2018-05-27 NOTE — Progress Notes (Signed)
ANTICOAGULATION CONSULT NOTE - Initial Consult  Pharmacy Consult for Coumadin Indication: LV thrombus  Allergies  Allergen Reactions  . Pork-Derived Products Other (See Comments)    Does not eat for religious reasons - okay with using IV heparin    Patient Measurements: Height: 5' 6.5" (168.9 cm) Weight: 165 lb 5.5 oz (75 kg) IBW/kg (Calculated) : 64.95  Vital Signs: Temp: 97.7 F (36.5 C) (07/30 0617) Temp Source: Oral (07/30 0617) BP: 97/73 (07/30 0617) Pulse Rate: 64 (07/30 0617)  Labs: Recent Labs    05/24/18 1828  05/24/18 2304 05/25/18 0554 05/25/18 1012 05/26/18 0444 05/27/18 0425  HGB 16.5  --   --  15.1  --   --   --   HCT 46.2  --   --  43.2  --   --   --   PLT 270  --   --  271  --   --   --   LABPROT 24.3*  --   --   --   --  28.5* 28.6*  INR 2.20  --   --   --   --  2.70 2.71  CREATININE 2.00*  --   --  1.60*  --  1.18 1.08  TROPONINI  --    < > 0.08* 0.07* 0.06*  --   --    < > = values in this interval not displayed.    Estimated Creatinine Clearance: 66 mL/min (by C-G formula based on SCr of 1.08 mg/dL).   Medical History: Past Medical History:  Diagnosis Date  . AICD (automatic cardioverter/defibrillator) present   . Anxiety   . Arthritis    "all over" (09/17/2017)  . Burn   . CAD (coronary artery disease)    a. NSTEMI s/p BMS to 1st Diagonal and distal OM2 in 2007; b. STEMI 03/26/12 s/p BMS to RCA; c. NSTEMI 10/2012 : CTO of LCx (unable to open with PCI) and PL branch, mod dz of LAD and diagonal, and preserved LV systolic fxn, Med Rx;  d.  anterior STEMI in 01/2017 with DES to Proximal LAD  . Cardiomyopathy EF 35% on cath 06/30/14, new from jan 2015 07/20/2014  . Chest pain   . CHF (congestive heart failure) ([place])   . Chronic back pain    "all over" (09/17/2017)  . DDD (degenerative disc disease), cervical   . Depression   . GERD (gastroesophageal reflux disease)   . Headache    "a few/wk" (09/17/2017)  . HTN (hypertension)   .  Hypercholesterolemia   . Mental disorder   . Myocardial infarction ([place])    "I've had 7" (09/17/2017)  . Pulmonary edema   . Respiratory failure ([place])   . Sciatic pain   . Sleep apnea   . Stroke [facility])    a. multiple dating back to 2002; *I''ve had 5; LUE/LLE weaker since" (09/17/2017)  . Tibia fracture (l) leg  . Tobacco abuse   . Type 2 diabetes mellitus ([place])   . Unstable angina (HCC)     Medications:  Medications Prior to Admission  Medication Sig Dispense Refill Last Dose  . carvedilol (COREG) 3.125 MG tablet TAKE (1) TABLET BY MOUTH TWICE A DAY WITH MEALS. 60 tablet 11 05/24/2018 at Unknown time  . clopidogrel (PLAVIX) 75 MG tablet Take 1 tablet (75 mg total) by mouth daily. 30 tablet 3 05/24/2018 at Unknown time  . digoxin (LANOXIN) 0.125 MG tablet Take 1 tablet (0.125 mg total) by mouth daily. 30 tablet 3  05/24/2018 at Unknown time  . Evolocumab (REPATHA SURECLICK) 664 MG/ML SOAJ Inject 1 pen into the skin every 14 (fourteen) days. 2 pen 11 Past Week at Unknown time  . ezetimibe (ZETIA) 10 MG tablet Take 1 tablet (10 mg total) by mouth daily. 30 tablet 3 05/24/2018 at Unknown time  . gabapentin (NEURONTIN) 100 MG capsule Take 1 capsule (100 mg total) by mouth 2 (two) times daily. 60 capsule 0 05/24/2018 at Unknown time  . glipiZIDE (GLUCOTROL XL) 10 MG 24 hr tablet Take 1 tablet (10 mg total) by mouth daily. 30 tablet 1 05/24/2018 at Unknown time  . losartan (COZAAR) 25 MG tablet Take 0.5 tablets (12.5 mg total) by mouth daily. 15 tablet 3 05/24/2018 at Unknown time  . metolazone (ZAROXOLYN) 2.5 MG tablet Take 2.5 mg by mouth See admin instructions. Take 1 tablet by mouth daily as needed for weight and fluid gain only   Past Week at Unknown time  . nitroGLYCERIN (NITROSTAT) 0.4 MG SL tablet PLACE ONE (1) TABLET UNDER TONGUE EVERY 5 MINUTES UP TO (3) DOSES AS NEEDED FOR CHEST PAIN. IF NO RELIEF, CONTACT MD. 25 tablet 3 not in past 30 days  . pantoprazole (PROTONIX) 20 MG tablet Take 1  tablet (20 mg total) by mouth daily. 30 tablet 0 05/24/2018 at Unknown time  . potassium chloride SA (K-DUR,KLOR-CON) 20 MEQ tablet Take 1 tablet (20 mEq total) by mouth daily. 30 tablet 3 05/24/2018 at Unknown time  . spironolactone (ALDACTONE) 25 MG tablet Take 1 tablet (25 mg total) by mouth every evening. 30 tablet 6 05/23/2018 at Unknown time  . torsemide (DEMADEX) 20 MG tablet Take 2 tablets (40 mg total) by mouth 2 (two) times daily. 360 tablet 3 05/24/2018 at Unknown time  . warfarin (COUMADIN) 5 MG tablet Hold coumadin tonight then decrease dose to 7.5mg  daily except 5mg  on Sundays and Wednesdays (Patient taking differently: Take 5-7.5 mg by mouth See admin instructions. 7.5mg  daily except 5mg  on Sundays and Wednesdays) 45 tablet 11 05/24/2018 at Unknown time    Assessment:  62 y.o. male with history of CAD, multiple MIs, stents, systolic CHF, EF 40%, diabetes mellitus came to hospital with chief complaints of dizziness. He is on chronic coumadin for LV mural thrombus. INR therapeutic Home regimen:  5mg  on Sun and  Wed, 7.5mg  all other days  Goal of Therapy:  INR 2-3 Monitor platelets by anticoagulation protocol: Yes   Plan:  Coumadin 7.5mg  po x 1 today PT-INR daily Monitor for S/S of bleeding  Margot Ables, PharmD Clinical Pharmacist 05/27/2018 11:06 AM

## 2018-05-27 NOTE — Clinical Social Work Note (Signed)
[facility] staff to pick patient up. Discharge clinicals sent. Patient's sister not contacted due to her stated that she no longer wished to be involved in previous communication.   LCSW signing off.    Daniel Li, Daniel Pugh, LCSW

## 2018-05-27 NOTE — NC FL2 (Signed)
[place] LEVEL OF CARE SCREENING TOOL     IDENTIFICATION  Patient Name: Daniel Li Birthdate: Nov 15, 1955 Sex: male Admission Date (Current Location): 05/24/2018  [facility] and [place] Number:  [employer] and Address:  [place] [address] [place]      Provider Number: [id number]  Attending Physician Name and Address:  Barton Dubois, MD  Relative Name and Phone Number:       Current Level of Care: Hospital Recommended Level of Care: [place] Prior Approval Number:    Date Approved/Denied:   PASRR Number:    Discharge Plan: Other (Comment)(ALF)    Current Diagnoses: Patient Active Problem List   Diagnosis Date Noted  . Hyponatremia   . Gastroesophageal reflux disease   . Dehydration with hyponatremia   . Acute on chronic renal failure ([place]) 05/25/2018  . Abdominal pain, acute, epigastric 05/17/2018  . Hernia of abdominal wall 05/17/2018  . Presumed Gastritis 05/17/2018  . AKI (acute kidney injury) ([place]) 05/16/2018  . Generalized abdominal pain   . Elevated troponin   . Chest pain 04/29/2018  . ICD (implantable cardioverter-defibrillator) in place 03/05/2018  . Anticoagulated on Coumadin 03/05/2018  . NSTEMI (non-ST elevated myocardial infarction) ([place]) 02/12/2018  . Precordial chest pain 02/11/2018  . Chronic systolic CHF (congestive heart failure) ([place]) 02/11/2018  . Post-infarction thrombus of left ventricle ([place]) 02/26/2017  . Chronic combined systolic (congestive) and diastolic (congestive) heart failure ([place])   . STEMI (ST elevation myocardial infarction) ([place]) 02/21/2017  . Ischemic cardiomyopathy 07/20/2014  . High risk medication use 01/28/2013  . Special screening for malignant neoplasms, colon 01/28/2013  . Hypercholesterolemia   . Tobacco abuse   . Diabetes mellitus type 2 with complications ([place]) 71/69/6789  . Benign essential HTN 03/26/2012  . CAD S/P percutaneous  coronary angioplasty 03/26/2012    Orientation RESPIRATION BLADDER Height & Weight     Self, Time, Situation, Place  Normal Continent Weight: 165 lb 5.5 oz (75 kg) Height:  5' 6.5" (168.9 cm)  BEHAVIORAL SYMPTOMS/MOOD NEUROLOGICAL BOWEL NUTRITION STATUS      Continent (Heart Healthy/carb modified)  AMBULATORY STATUS COMMUNICATION OF NEEDS Skin   Limited Assist Verbally Normal                       Personal Care Assistance Level of Assistance  Bathing, Feeding, Dressing Bathing Assistance: Limited assistance Feeding assistance: Independent Dressing Assistance: Limited assistance     Functional Limitations Info  Sight, Hearing, Speech Sight Info: Adequate Hearing Info: Adequate Speech Info: Adequate    SPECIAL CARE FACTORS FREQUENCY                       Contractures Contractures Info: Not present    Additional Factors Info  Code Status, Allergies Code Status Info: Full Code Allergies Info: Pork-derived products Psychotropic Info: n/a   Isolation Precautions Info: 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 acetaminophen (TYLENOL) tablet 650 mg  650 mg Oral Q6H PRN Barton Dubois, MD   650 mg at 05/26/18 2102  . carvedilol (COREG) tablet 3.125 mg  3.125 mg Oral BID WC Oswald Hillock, MD   Stopped at 05/27/18 [phone]  . clopidogrel (PLAVIX) tablet 75 mg  75 mg Oral Daily [place], Gagan  S, MD   75 mg at 05/27/18 0806  . digoxin (LANOXIN) tablet 0.125 mg  0.125 mg Oral Daily Arnoldo Lenis, MD   0.125 mg at 05/27/18 0805  . ezetimibe (ZETIA) tablet 10 mg  10 mg Oral Daily Oswald Hillock, MD   10 mg at 05/27/18 0805  . gabapentin (NEURONTIN) capsule 100 mg  100 mg Oral BID Oswald Hillock, MD   100 mg at 05/27/18 0806  . insulin aspart (novoLOG) injection 0-9 Units  0-9 Units Subcutaneous TID  WC Oswald Hillock, MD   2 Units at 05/27/18 1144  . meclizine (ANTIVERT) tablet 12.5 mg  12.5 mg Oral TID PRN Oswald Hillock, MD      . nicotine (NICODERM CQ - dosed in mg/24 hours) patch 21 mg  21 mg Transdermal Daily Blount, Scarlette Shorts T, NP   21 mg at 05/27/18 0805  . ondansetron (ZOFRAN) tablet 4 mg  4 mg Oral Q6H PRN Oswald Hillock, MD       Or  . ondansetron [facility]) injection 4 mg  4 mg Intravenous Q6H PRN Oswald Hillock, MD   4 mg at 05/25/18 2204  . pantoprazole (PROTONIX) EC tablet 40 mg  40 mg Oral BID Barton Dubois, MD   40 mg at 05/27/18 0806  . potassium chloride SA (K-DUR,KLOR-CON) CR tablet 40 mEq  40 mEq Oral Daily Barton Dubois, MD   40 mEq at 05/27/18 3419  . potassium chloride SA (K-DUR,KLOR-CON) CR tablet 40 mEq  40 mEq Oral Daily Barton Dubois, MD   40 mEq at 05/27/18 0805  . traMADol (ULTRAM) tablet 50 mg  50 mg Oral Q8H PRN Barton Dubois, MD   50 mg at 05/27/18 0805  . warfarin (COUMADIN) tablet 7.5 mg  7.5 mg Oral Once Barton Dubois, MD      . Warfarin - Pharmacist Dosing Inpatient   Does not apply [id number] Barton Dubois, MD         Discharge Medications: Medication List    STOP taking these medications   metolazone 2.5 MG tablet Commonly known as:  ZAROXOLYN   spironolactone 25 MG tablet Commonly known as:  ALDACTONE     TAKE these medications   acetaminophen 325 MG tablet Commonly known as:  TYLENOL Take 2 tablets (650 mg total) by mouth every 6 (six) hours as needed for mild pain, fever or headache.   carvedilol 3.125 MG tablet Commonly known as:  COREG TAKE (1) TABLET BY MOUTH TWICE A DAY WITH MEALS.   clopidogrel 75 MG tablet Commonly known as:  PLAVIX Take 1 tablet (75 mg total) by mouth daily.   digoxin 0.125 MG tablet Commonly known as:  LANOXIN Take 1 tablet (0.125 mg total) by mouth daily.   Evolocumab 140 MG/ML Soaj Commonly known as:  REPATHA SURECLICK Inject 1 pen into the skin every 14 (fourteen) days.   ezetimibe 10 MG  tablet Commonly known as:  ZETIA Take 1 tablet (10 mg total) by mouth daily.   gabapentin 100 MG capsule Commonly known as:  NEURONTIN Take 1 capsule (100 mg total) by mouth 2 (two) times daily.   glipiZIDE 10 MG 24 hr tablet Commonly known as:  GLUCOTROL XL Take 1 tablet (10 mg total) by mouth daily.   losartan 25 MG tablet Commonly known as:  COZAAR Take 0.5 tablets (12.5 mg total) by mouth daily.   meclizine 12.5 MG tablet Commonly known as:  ANTIVERT Take 1 tablet (12.5 mg total) by  mouth 3 (three) times daily as needed for dizziness.   nicotine 21 mg/24hr patch Commonly known as:  NICODERM CQ - dosed in mg/24 hours Place 1 patch (21 mg total) onto the skin daily. Start taking on:  05/28/2018   nitroGLYCERIN 0.4 MG SL tablet Commonly known as:  NITROSTAT PLACE ONE (1) TABLET UNDER TONGUE EVERY 5 MINUTES UP TO (3) DOSES AS NEEDED FOR CHEST PAIN. IF NO RELIEF, CONTACT MD.   pantoprazole 40 MG tablet Commonly known as:  PROTONIX Take 1 tablet (40 mg total) by mouth 2 (two) times daily. What changed:    medication strength  how much to take  when to take this   potassium chloride SA 20 MEQ tablet Commonly known as:  K-DUR,KLOR-CON Take 1 tablet (20 mEq total) by mouth daily.   torsemide 20 MG tablet Commonly known as:  DEMADEX Take 1 tablet (20 mg total) by mouth daily. Start taking on:  05/29/2018 What changed:    how much to take  when to take this  These instructions start on 05/29/2018. If you are unsure what to do until then, ask your doctor or other care provider.   warfarin 5 MG tablet Commonly known as:  COUMADIN Take as directed. If you are unsure how to take this medication, talk to your nurse or doctor. Original instructions:  Hold coumadin tonight then decrease dose to 7.5mg  daily except 5mg  on Sundays and Wednesdays What changed:    how much to take  how to take this  when to take this  additional instructions      Relevant  Imaging Results:  Relevant Lab Results:   Additional Information SSN: [id number]  Ihor Gully, LCSW

## 2018-05-27 NOTE — Progress Notes (Addendum)
Progress Note  Patient Name: Daniel Li Date of Encounter: 05/27/2018  Primary Cardiologist: Carlyle Dolly, MD   Subjective   Feeling much improved. Denies any recurrent dizziness. Reports "hurting all over, especially along his feet". No dyspnea, orthopnea, or PND.   Inpatient Medications    Scheduled Meds: . carvedilol  3.125 mg Oral BID WC  . clopidogrel  75 mg Oral Daily  . digoxin  0.125 mg Oral Daily  . ezetimibe  10 mg Oral Daily  . gabapentin  100 mg Oral BID  . insulin aspart  0-9 Units Subcutaneous TID WC  . nicotine  21 mg Transdermal Daily  . pantoprazole  40 mg Oral BID  . potassium chloride  40 mEq Oral Daily  . potassium chloride  40 mEq Oral Daily  . Warfarin - Pharmacist Dosing Inpatient   Does not apply q1800   Continuous Infusions: . 0.9 % NaCl with KCl 20 mEq / L 50 mL/hr at 05/27/18 0600   PRN Meds: acetaminophen, meclizine, ondansetron **OR** ondansetron (ZOFRAN) IV, traMADol   Vital Signs    Vitals:   05/26/18 1137 05/26/18 1530 05/26/18 2124 05/27/18 0617  BP:  110/78 105/85 97/73  Pulse: 75 76 87 64  Resp:   18 18  Temp:  98.4 F (36.9 C) 98.4 F (36.9 C) 97.7 F (36.5 C)  TempSrc:  Oral Oral Oral  SpO2:  100% 92% (!) 72%  Weight:      Height:        Intake/Output Summary (Last 24 hours) at 05/27/2018 0745 Last data filed at 05/27/2018 0600 Gross per 24 hour  Intake 3119.17 ml  Output -  Net 3119.17 ml   Filed Weights   05/24/18 1715 05/24/18 2157  Weight: 170 lb (77.1 kg) 165 lb 5.5 oz (75 kg)    Telemetry    NSR, HR in 60's to 70's. Occasional PVC's. - Personally Reviewed  ECG    No new tracings.   Physical Exam   General: Well developed, African American male appearing in no acute distress. Head: Normocephalic, atraumatic.  Neck: Supple without bruits, JVD not elevated. Lungs:  Resp regular and unlabored, CTA without wheezing or rales. Heart: RRR, S1, S2, no S3, S4, or murmur; no rub. Abdomen: Soft,  non-tender, non-distended with normoactive bowel sounds. No hepatomegaly. No rebound/guarding. No obvious abdominal masses. Extremities: No clubbing, cyanosis, or lower extremity edema. Distal pedal pulses are 2+ bilaterally. Neuro: Alert and oriented X 3. Moves all extremities spontaneously. Psych: Normal affect.  Labs    Chemistry Recent Labs  Lab 05/24/18 1828 05/25/18 0554 05/26/18 0444 05/27/18 0425  NA 124* 127* 125* 133*  K 3.4* 2.7* 3.1* 4.3  CL 75* 79* 81* 92*  CO2 34* 36* 33* 34*  GLUCOSE 154* 136* 203* 127*  BUN 52* 48* 26* 16  CREATININE 2.00* 1.60* 1.18 1.08  CALCIUM 9.6 9.1 8.8* 8.6*  PROT 7.8 7.1  --   --   ALBUMIN 4.5 4.0  --   --   AST 41 35  --   --   ALT 24 23  --   --   ALKPHOS 53 49  --   --   BILITOT 1.2 0.7  --   --   GFRNONAA 34* 45* >60 >60  GFRAA 40* 52* >60 >60  ANIONGAP 15 12 11 7      Hematology Recent Labs  Lab 05/24/18 1828 05/25/18 0554  WBC 11.8* 13.8*  RBC 6.08* 5.68  HGB 16.5 15.1  HCT 46.2 43.2  MCV 76.0* 76.1*  MCH 27.1 26.6  MCHC 35.7 35.0  RDW 13.7 13.8  PLT 270 271    Cardiac Enzymes Recent Labs  Lab 05/24/18 1829 05/24/18 2304 05/25/18 0554 05/25/18 1012  TROPONINI 0.08* 0.08* 0.07* 0.06*   No results for input(s): TROPIPOC in the last 168 hours.   BNPNo results for input(s): BNP, PROBNP in the last 168 hours.   DDimer No results for input(s): DDIMER in the last 168 hours.   Radiology    No results found.  Cardiac Studies   Echocardiogram: 01/2018 Study Conclusions  - Left ventricle: The cavity size was mildly to moderately dilated.   Wall thickness was normal. Systolic function was severely   reduced. The estimated ejection fraction was in the range of 20%   to 25%. Diffuse hypokinesis. Doppler parameters are consistent   with abnormal left ventricular relaxation (grade 1 diastolic   dysfunction). - Regional wall motion abnormality: Akinesis of the basal-mid   anteroseptal and apical  myocardium. - Aortic valve: Valve area (VTI): 1.87 cm^2. Valve area (Vmax):   1.93 cm^2. Valve area (Vmean): 1.7 cm^2. - Mitral valve: There was mild regurgitation. - Left atrium: The atrium was moderately dilated. - Technically adequate study.  Cardiac Catheterization: 01/2018  RPDA lesion is 75% stenosed.  Post Atrio lesion is 100% stenosed.  Ost Cx to Prox Cx lesion is 100% stenosed.  Previously placed Ost LAD to Prox LAD stent (unknown type) is widely patent.  Prox LAD lesion is 50% stenosed.  Previously placed Ost 1st Diag stent (unknown type) is widely patent.  1st Diag lesion is 75% stenosed.  IMPRESSION:Mr. Carbo's anatomy is unchanged compared to his previous angiogram which I performed a year ago in setting of an anterior STEMI. This proximal LAD is widely patent. 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 He has a total distal PLA Annelisa Ryback and a 75% focal proximal PDA. I did not perform left ventriculography obtain left heart pressures because of a known left ventricular. Eye suspected this was demand ischemia, type II non-STEMI. He has severe LV dysfunction and does have an ICD in place for primary prevention. Continue medical therapy will be recommended. The sheath was removed and a TR band was placed on the right wrist to achieve patient hemostasis. The patient left the lab in stable condition. He can be re-coumadinized for his mural thrombus.   Patient Profile     62 y.o. male w/ PMH of CAD (s/p BMS to D1 and OM in 11/2015, BMS to RCA in 2013, CTO of LCx in 2014, DES to LAD in 01/2017, patent LAD and D1 stents by cath in 01/2018 with chronic occlusion of LCx with bidirectional collaterals noted), chronic combined systolic and diastolic CHF (EF 40% by echo in 01/2018), ischemic cardiomyopathy (s/p St Jude ICD placement in 08/2017), HTN, HLD, Type 2 DM, and prior CVA whop presented to [facility] ED on 05/24/2018 for evaluation of worsening  weakness and dizziness.   Assessment & Plan    1. Dizziness and Orthostatic Hypotension - presented with worsening symptoms, found to have an AKI with creatinine elevated to 2.00 (baseline 1.2 - 1.3). He has been receiving IVF at 50 mL/hr with improvement in his electrolytes and kidney function. Creatinine now improved to 1.08. - he appears euvolemic on examination and has noted improvement in his dizziness. Will plan to stop IVF. Follow BP trend and if this allows, restart Losartan at 12.5mg  daily. He just  now received his AM medications so would follow BP after administration of these prior to resuming ARB.   2. Extensive CAD History - s/p BMS to D1 and OM in 11/2015, BMS to RCA in 2013, CTO of LCx in 2014, DES to LAD in 01/2017, patent LAD and D1 stents by cath in 01/2018 with chronic occlusion of LCx with bidirectional collaterals noted.  - cyclic troponin values have been flat at 0.08, 0.08, and 0.07 this admission, consistent with prior hospitalizations. Not consistent with ACS.  - Continue Plavix, BB therapy, and Zetia. Also on Repatha as an outpatient. Has a history of Rhabdomyolysis with statins in the past.   3. Chronic Systolic CHF - EF of 20 to 25% by echocardiogram in 01/2018. Was on Torsemide 40mg  BID prior to admission but this has been held in the setting of his AKI and dehydration. He has been restarted on PTA Digoxin and Coreg. Will review with Dr. Harl Bowie in regards to restarting Losartan 12.5mg  daily. Can likely be restarted today with possibility of resuming Spironolactone as an outpatient. The plan is for his Torsemide dosing to be reduced to 20mg  BID at the time of discharge.   4. Ischemic Cardiomyopathy - s/p St. Jude ICD placement in 08/2017. Most recent interrogation in 03/2018 showed normal device function but thoracic impedence was elevated which led to changes in his diuretic regimen.   5. History of LV Thrombus - No mention of thrombus noted by echocardiogram in  01/2018. Has remained on Coumadin. Will need to review with Dr. Harl Bowie (patient's Primary Cardiologist) regarding continuing this.   6. Hyponatremia/ Hypokalemia - on admission, Na+ was 124 with K+ at 2.7. Repeat labs this morning shows Na+ has improved to 133 and K+ at 4.3.   For questions or updates, please contact [place] Please consult [email or web address] for contact info under Cardiology/STEMI.   Arna Medici , PA-C 7:45 AM 05/27/2018 Pager: [phone]  Attending Note Patient seen and discussed with PA Ahmed Prima, I agree with her documentation above. Extensive cardiac history as outlined above. Recent issues with recurrent hypovolemia in setting of recent GI issues and decreased oral intake while on diuretics. Admitted with AKI and hyponatremia, both improving with gentle IVFs. We will d/c IVFs today, continue to hold diuretics, resume them tomorrow at lower dose of toresemide 20mg  bid. Initially low bp's on admission, his losartan and aldactone were held. Remain soft this morning, follow into afternoon, if SBP above 105 would restart his losartan. Hold aldactone until outpatient follow up.   Pending bp's this afternoon likely discharge   Carlyle Dolly MD

## 2018-05-28 ENCOUNTER — Encounter (HOSPITAL_COMMUNITY): Payer: Medicaid Other

## 2018-06-01 ENCOUNTER — Telehealth: Payer: Self-pay | Admitting: Cardiology

## 2018-06-01 NOTE — Telephone Encounter (Signed)
Received call from ALF w/ notification that pt gained 3 lb in 24 Hrs. Chart reviewed. He has a h/o CHF and is on torsemide. Nursing staff notes some abdominal distention today. Labs reviewed. He had a BMP 5 days ago that showed normal Scr at 1 and normal K at 4. Pt took 20 mg of torsemide this morning and 20 mEq of K-dur. I advised RN to give another dose of 20 mg of Torsemide and another 20 mg of K-dur for today only. Recheck weight again tomorrow morning and call back if any additional weight gain.   Lyda Jester PA-C 06/01/2018

## 2018-06-02 ENCOUNTER — Ambulatory Visit (INDEPENDENT_AMBULATORY_CARE_PROVIDER_SITE_OTHER): Payer: Medicaid Other

## 2018-06-02 DIAGNOSIS — Z95810 Presence of automatic (implantable) cardiac defibrillator: Secondary | ICD-10-CM

## 2018-06-02 DIAGNOSIS — I5022 Chronic systolic (congestive) heart failure: Secondary | ICD-10-CM | POA: Diagnosis not present

## 2018-06-03 ENCOUNTER — Telehealth (HOSPITAL_COMMUNITY): Payer: Self-pay | Admitting: *Deleted

## 2018-06-03 NOTE — Progress Notes (Signed)
EPIC Encounter for ICM Monitoring  Patient Name: Daniel Li is a 62 y.o. male Date: 06/03/2018 Primary Care Physican: [name], NP Primary Cardiologist:Branch/ [facility] Electrophysiologist:Allred Dry Weight: 171.8lbs per 8/6 telephone note      ALF called HF clinic today, 06/03/18, reporting patient weight gain on 8/4 and 8/6. Weight increase from 167.8 to 171.8 in last 24 hrs.  Patient has had 5 ER visit in July with 3 being admitted to the hospital.  Patient resides in ALF.   Thoracic impedance abnormal suggesting fluid accumulation starting at time of last hospital discharge on 05/26/2018.  Prescribed dosage: Torsemide20 mgTake 1 tablet (20 mg total) by mouth daily.   Labs: 05/27/2018 Creatinine 1.05, BUN 16, Potassium 4.3, Sodium 133, EGFR >60 05/26/2018 Creatinine 1.18, BUN 26, Potassium 3.1, Sodium 125, EGFR >60  05/25/2018 Creatinine 1.60, BUN 48, Potassium 2.7, Sodium 127, EGFR 45-52  05/24/2018 Creatinine 2.00, BUN 52, Potassium 3.4, Sodium 124, EGFR 34-40  05/17/2018 Creatinine 1.24, BUN 24, Potassium 3.8, Sodium 134, EGFR >60  05/16/2018 Creatinine 1.34, BUN 34, Potassium 3.8, Sodium 132, EGFR 56->60  05/15/2018 Creatinine 1.89, BUN 52, Potassium 3.6, Sodium 131, EGFR 37-42  05/14/2018 Creatinine 2.09, BUN 38, Potassium 3.7, Sodium 133, EGFR 33-38  05/12/2018 Creatinine 1.71, BUN 28, Potassium 3.3, Sodium 137, EGFR 41-48  05/06/2018 Creatinine 1.13, BUN 17, Potassium 3.6, Sodium 138, EGFR >60  04/29/2018 Creatinine 1.10, BUN 11, Potassium 4.0, Sodium 139 03/31/2018 Creatinine 1.09, BUN 14, Potassium 3.8, Sodium 139, EGFR >60  03/05/2018 Creatinine 1.23, BUN 15, Potassium 4.2, Sodium 139, EGFR >60  A complete set of results can be found in Results Review.  Recommendations:  HF clinic provided recommendations today, 06/03/18 patient can take 1 extra Torsemide tablet today.   Follow-up plan: ICM clinic phone appointment on 06/12/2018 to recheck fluid  levels.   Office appointment scheduled 06/25/2018 with Bernerd Pho, Ama and 07/09/2018 with Dr Aundra Dubin.    Copy of ICM check sent to Dr. Rayann Heman and Oda Kilts, PA (HF clinic) since he has already made recommendations today.   3 month ICM trend: 06/03/2018    1 Year ICM trend:       Rosalene Billings, RN 06/03/2018 8:55 AM

## 2018-06-03 NOTE — Telephone Encounter (Signed)
Ok to take extra torsemide 20 mg x 1.     [name] [address] [place], PA-C 06/03/2018 9:02 AM

## 2018-06-03 NOTE — Telephone Encounter (Signed)
Daniel Li called to report a 4lb weight gain overnight. Weight yesterday was 167.8lbs today 171.8lbs. Pt is asymptomatic and states he feels fine. Pt taking medications as prescribed. She wants to know if patient needs to take an extra Torsemide today. Call back #[phone].  Message routed to [place] for advice.

## 2018-06-03 NOTE — Telephone Encounter (Signed)
Judy aware and agreeable with plan.

## 2018-06-03 NOTE — Progress Notes (Signed)
Addressed via phone note.

## 2018-06-12 ENCOUNTER — Ambulatory Visit (INDEPENDENT_AMBULATORY_CARE_PROVIDER_SITE_OTHER): Payer: Medicaid Other

## 2018-06-12 ENCOUNTER — Telehealth (HOSPITAL_COMMUNITY): Payer: Self-pay | Admitting: *Deleted

## 2018-06-12 DIAGNOSIS — Z95810 Presence of automatic (implantable) cardiac defibrillator: Secondary | ICD-10-CM

## 2018-06-12 DIAGNOSIS — I5022 Chronic systolic (congestive) heart failure: Secondary | ICD-10-CM

## 2018-06-12 NOTE — Telephone Encounter (Signed)
He can take an extra 20 mg torsemide for the next two days. Thanks.

## 2018-06-12 NOTE — Telephone Encounter (Signed)
Called and verbal orders given to [place].  She will call us back if he continues gaining weight.

## 2018-06-12 NOTE — Progress Notes (Signed)
EPIC Encounter for ICM Monitoring  Patient Name: Daniel Li is a 62 y.o. male Date: 06/12/2018 Primary Care Physican: [name], NP Primary Care Physican: [name], NP Primary Cardiologist:Branch/ [facility] Electrophysiologist:Allred Dry Weight: 171.8lbsper 8/6 telephone note              Attempted call to patient and unable to reach.   Transmission reviewed.  Lives at [facility] facility.     Thoracic impedance normal but was abnormal suggesting fluid accumulation from 05/26/2018 - 06/08/2018.  Prescribed dosage: Torsemide20 mgTake1 tablet (36mtotal)by mouth daily.   Labs: 05/27/2018 Creatinine 1.05, BUN 16, Potassium 4.3, Sodium 133, EGFR >60 05/26/2018 Creatinine 1.18, BUN 26, Potassium 3.1, Sodium 125, EGFR >60  05/25/2018 Creatinine 1.60, BUN 48, Potassium 2.7, Sodium 127, EGFR 45-52  05/24/2018 Creatinine 2.00, BUN 52, Potassium 3.4, Sodium 124, EGFR 34-40  05/17/2018 Creatinine 1.24, BUN 24, Potassium 3.8, Sodium 134, EGFR >60  05/16/2018 Creatinine 1.34, BUN 34, Potassium 3.8, Sodium 132, EGFR 56->60  05/15/2018 Creatinine 1.89, BUN 52, Potassium 3.6, Sodium 131, EGFR 37-42  05/14/2018 Creatinine 2.09, BUN 38, Potassium 3.7, Sodium 133, EGFR 33-38  05/12/2018 Creatinine 1.71, BUN 28, Potassium 3.3, Sodium 137, EGFR 41-48  05/06/2018 Creatinine 1.13, BUN 17, Potassium 3.6, Sodium 138, EGFR >60  04/29/2018 Creatinine 1.10, BUN 11, Potassium 4.0, Sodium 139 03/31/2018 Creatinine 1.09, BUN 14, Potassium 3.8, Sodium 139, EGFR >60  03/05/2018 Creatinine 1.23, BUN 15, Potassium 4.2, Sodium 139, EGFR >60  A complete set of results can be found in Results Review.  Recommendations: Per telephone note 8/15, patient was advised to take extra 20 mg torsemide for the next two days due to weight gain.  Follow-up plan: ICM clinic phone appointment on 06/16/2018 to recheck fluid levels.      Copy of ICM check sent to Dr. ARayann Hemanand Dr MAundra Dubin    3 month ICM trend: 06/12/2018    1 Year ICM trend:       LRosalene Billings RN 06/12/2018 5:43 PM

## 2018-06-12 NOTE — Telephone Encounter (Signed)
Daniel Li with [facility] called to report patient had a 7 lb wt gain overnight.  Wt yesterday was 168.2 and today 175 lbs.  Asymptomatic. On fluid restrictions 64oz/day but patient was seen on camera drinking a jug of something yesterday (koolaide or Gatorade).   Advised her to educate patient again on strict fluid restrictions.  Patient had metolazone PRN but it was discontinued. Patient takes torsemide 20 mg daily.  She is asking what else she can give him.  Will forward to Lillia [place], NP to review.

## 2018-06-13 ENCOUNTER — Telehealth: Payer: Self-pay

## 2018-06-13 NOTE — Addendum Note (Signed)
Addended by: Rosalene Billings on: 06/13/2018 02:52 PM   Modules accepted: Level of Service

## 2018-06-13 NOTE — Telephone Encounter (Signed)
Remote ICM transmission received.  Attempted call to patient at [facility] facility and no answer.

## 2018-06-16 ENCOUNTER — Telehealth: Payer: Self-pay

## 2018-06-16 ENCOUNTER — Encounter (HOSPITAL_COMMUNITY): Payer: Self-pay

## 2018-06-16 ENCOUNTER — Other Ambulatory Visit: Payer: Self-pay

## 2018-06-16 ENCOUNTER — Emergency Department (HOSPITAL_COMMUNITY)
Admission: EM | Admit: 2018-06-16 | Discharge: 2018-06-16 | Disposition: A | Discharge status: Home / Self Care | Payer: Medicaid Other | Attending: Emergency Medicine | Admitting: Emergency Medicine

## 2018-06-16 ENCOUNTER — Emergency Department (HOSPITAL_COMMUNITY): Payer: Medicaid Other

## 2018-06-16 DIAGNOSIS — I2510 Atherosclerotic heart disease of native coronary artery without angina pectoris: Secondary | ICD-10-CM | POA: Diagnosis not present

## 2018-06-16 DIAGNOSIS — Z7901 Long term (current) use of anticoagulants: Secondary | ICD-10-CM | POA: Diagnosis not present

## 2018-06-16 DIAGNOSIS — I110 Hypertensive heart disease with heart failure: Secondary | ICD-10-CM | POA: Insufficient documentation

## 2018-06-16 DIAGNOSIS — Z79899 Other long term (current) drug therapy: Secondary | ICD-10-CM | POA: Diagnosis not present

## 2018-06-16 DIAGNOSIS — E119 Type 2 diabetes mellitus without complications: Secondary | ICD-10-CM | POA: Insufficient documentation

## 2018-06-16 DIAGNOSIS — I5022 Chronic systolic (congestive) heart failure: Secondary | ICD-10-CM | POA: Insufficient documentation

## 2018-06-16 DIAGNOSIS — R0789 Other chest pain: Secondary | ICD-10-CM | POA: Diagnosis present

## 2018-06-16 DIAGNOSIS — R05 Cough: Secondary | ICD-10-CM | POA: Diagnosis not present

## 2018-06-16 DIAGNOSIS — Z955 Presence of coronary angioplasty implant and graft: Secondary | ICD-10-CM | POA: Diagnosis not present

## 2018-06-16 DIAGNOSIS — Z7902 Long term (current) use of antithrombotics/antiplatelets: Secondary | ICD-10-CM | POA: Diagnosis not present

## 2018-06-16 DIAGNOSIS — Z87891 Personal history of nicotine dependence: Secondary | ICD-10-CM | POA: Diagnosis not present

## 2018-06-16 DIAGNOSIS — R059 Cough, unspecified: Secondary | ICD-10-CM

## 2018-06-16 LAB — CBC
HEMATOCRIT: 36.4 % — AB (ref 39.0–52.0)
Hemoglobin: 11.9 g/dL — ABNORMAL LOW (ref 13.0–17.0)
MCH: 26.1 pg (ref 26.0–34.0)
MCHC: 32.7 g/dL (ref 30.0–36.0)
MCV: 79.8 fL (ref 78.0–100.0)
Platelets: 242 10*3/uL (ref 150–400)
RBC: 4.56 MIL/uL (ref 4.22–5.81)
RDW: 16.2 % — AB (ref 11.5–15.5)
WBC: 4.5 10*3/uL (ref 4.0–10.5)

## 2018-06-16 LAB — BASIC METABOLIC PANEL
Anion gap: 7 (ref 5–15)
BUN: 12 mg/dL (ref 8–23)
CALCIUM: 8.3 mg/dL — AB (ref 8.9–10.3)
CHLORIDE: 106 mmol/L (ref 98–111)
CO2: 27 mmol/L (ref 22–32)
CREATININE: 0.98 mg/dL (ref 0.61–1.24)
Glucose, Bld: 190 mg/dL — ABNORMAL HIGH (ref 70–99)
Potassium: 3.5 mmol/L (ref 3.5–5.1)
SODIUM: 140 mmol/L (ref 135–145)

## 2018-06-16 LAB — BRAIN NATRIURETIC PEPTIDE: B NATRIURETIC PEPTIDE 5: 88 pg/mL (ref 0.0–100.0)

## 2018-06-16 LAB — DIGOXIN LEVEL: Digoxin Level: 0.4 ng/mL — ABNORMAL LOW (ref 0.8–2.0)

## 2018-06-16 LAB — PROTIME-INR
INR: 3.53
PROTHROMBIN TIME: 35.1 s — AB (ref 11.4–15.2)

## 2018-06-16 LAB — TROPONIN I: Troponin I: 0.03 ng/mL (ref <0.03)

## 2018-06-16 MED ORDER — SODIUM CHLORIDE 0.9 % IV BOLUS
500.0000 mL | Freq: Once | INTRAVENOUS | Status: AC
  Administered 2018-06-16: 500 mL via INTRAVENOUS

## 2018-06-16 MED ORDER — MORPHINE SULFATE (PF) 4 MG/ML IV SOLN
4.0000 mg | Freq: Once | INTRAVENOUS | Status: AC
  Administered 2018-06-16: 4 mg via INTRAVENOUS
  Filled 2018-06-16: qty 1

## 2018-06-16 MED ORDER — ASPIRIN 81 MG PO CHEW
324.0000 mg | CHEWABLE_TABLET | Freq: Once | ORAL | Status: AC
  Administered 2018-06-16: 324 mg via ORAL
  Filled 2018-06-16: qty 4

## 2018-06-16 MED ORDER — HYDROCODONE-ACETAMINOPHEN 5-325 MG PO TABS
1.0000 | ORAL_TABLET | Freq: Once | ORAL | Status: AC
  Administered 2018-06-16: 1 via ORAL
  Filled 2018-06-16: qty 1

## 2018-06-16 NOTE — Telephone Encounter (Signed)
Spoke with pt and reminded pt of remote transmission that is due today. Pt verbalized understanding.   

## 2018-06-16 NOTE — Telephone Encounter (Signed)
Daniel Li calling because pt monitor keep going off. I instructed the pt to call [facility] tech support but they said they did not answer. Pt is complaining of pressure on the upper right side and feeling dizzy and overall not feeling well
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
## 2018-06-16 NOTE — Telephone Encounter (Signed)
Call to patient as requested by device CMA because patient is feeling symptomatic today.  Spoke with patient.  He complains of constant right sided chest pressure that radiates to back, dizziness, and unable to sleep at night due to pain. He could not be specific how many days he's had symptoms.  Asked if he has tried Nitroglycerin for chest pain and he stated no.  As I was talking to patient, assisted living personnel Seward Speck gave him 1st Nitro and spoke with her to advise him to take as prescribed which is 1 tablet under tongue every 5 minutes x 3 doses.  If no relief after 3rd Nitro dose then patient should go to ER.  She verbalized understanding of directions.

## 2018-06-16 NOTE — Discharge Instructions (Addendum)
You were evaluated in the emergency department for chest pain and cough its been going on for 4 days.  There is no evidence that there is any heart injury and your x-ray did not show an obvious pneumonia.  It will be important for you to follow-up with your doctors for further management of this.

## 2018-06-16 NOTE — ED Notes (Signed)
This nurse called [facility] to request pick up for patient.  Staff stated she would call "Mrs. Daniel Li" to come pick up the resident.

## 2018-06-16 NOTE — ED Notes (Signed)
Discharge information went over with Bethena Roys from [facility] assisted living facility  Pt able to sign discharge information

## 2018-06-16 NOTE — Telephone Encounter (Signed)
See other phone note for today. 

## 2018-06-16 NOTE — ED Notes (Signed)
EKG given to Dr. Butler 

## 2018-06-16 NOTE — ED Notes (Signed)
CRITICAL VALUE ALERT  Critical Value:  Trop 0.03  Date & Time Notied:  1826, 06/16/18  Provider Notified: Dr. Melina Copa  Orders Received/Actions taken: no new orders at this time

## 2018-06-16 NOTE — ED Triage Notes (Signed)
Pt has a history of chronic chest pain. Chronic elevation in V1 V2 and V3. Defibrilator implanted. Was given 3 nitro on scene and BP hypotensive. Was given 150 mL. Is now 96/64. Has been coughing for 4 days with green sputum. When palpating chest, pt complains of pain as well.

## 2018-06-19 NOTE — Progress Notes (Signed)
  No ICM remote transmission received for 06/16/2018 and next ICM transmission scheduled for 07/07/2018.

## 2018-06-25 ENCOUNTER — Ambulatory Visit: Payer: Medicaid Other | Admitting: Student

## 2018-07-07 ENCOUNTER — Telehealth: Payer: Self-pay

## 2018-07-07 ENCOUNTER — Ambulatory Visit (INDEPENDENT_AMBULATORY_CARE_PROVIDER_SITE_OTHER): Payer: Medicaid Other

## 2018-07-07 DIAGNOSIS — I5022 Chronic systolic (congestive) heart failure: Secondary | ICD-10-CM | POA: Diagnosis not present

## 2018-07-07 DIAGNOSIS — Z95810 Presence of automatic (implantable) cardiac defibrillator: Secondary | ICD-10-CM | POA: Diagnosis not present

## 2018-07-07 NOTE — Telephone Encounter (Signed)
Spoke with pt and reminded pt of remote transmission that is due today. Pt verbalized understanding.   
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
## 2018-07-07 NOTE — Progress Notes (Signed)
EPIC Encounter for ICM Monitoring  Patient Name: Daniel Li is a 62 y.o. male Date: 07/07/2018 Primary Care Physican: [name], NP Primary Cardiologist:[facility] Electrophysiologist:Allred Dry Weight:unknown        Heart Failure questions reviewed, pt asymptomatic.   Thoracic impedance normal.  Prescribed dosage: Torsemide20 mgTake1 tablet (21mtotal)by mouth daily.  Labs: 05/27/2018 Creatinine1.05, BUN16, Potassium4.3, Sodium133, EGFR>60 05/26/2018 Creatinine1.18, BUN26, Potassium3.1, [id number] EGFR>60  05/25/2018 Creatinine1.60, BUN48, Potassium2.7, [id number] [id number] 05/24/2018 Creatinine2.00, BUN52, Potassium3.4, [id number] 05/17/2018 Creatinine1.24, BUN24, Potassium3.8, Sodium134, EGFR>60  05/16/2018 Creatinine1.34, BUN34, Potassium3.8, Sodium132, EGFR56->60  05/15/2018 Creatinine1.89, BUN52, Potassium3.6, Sodium131, [id number] 05/14/2018 Creatinine2.09, BUN38, Potassium3.7, [id number] 05/12/2018 Creatinine1.71, BUN28, Potassium3.3, [id number] 05/06/2018 Creatinine1.13, BUN17, Potassium3.6, Sodium138, EGFR>60  04/29/2018 Creatinine1.10, BUN11, Potassium4.0, Sodium139 03/31/2018 Creatinine1.09, BUN14, Potassium3.8, Sodium139, EGFR>60  03/05/2018 Creatinine1.23, BUN15, Potassium4.2, Sodium139, EGFR>60 A complete set of results can be found in Results Review.  Recommendations: No changes.    Encouraged to call for fluid symptoms.  Follow-up plan: ICM clinic phone appointment on 08/07/2018.   Office appointment scheduled 07/08/2018 with BBernerd Pho [facility]pper [place]    Copy of ICM check sent to Dr. ARayann Hemanand BBernerd Pho PA.   3 month ICM trend: 07/07/2018    1 Year ICM trend:       LRosalene Billings RN 07/07/2018 2:50 PM

## 2018-07-08 ENCOUNTER — Encounter: Payer: Self-pay | Admitting: Student

## 2018-07-08 ENCOUNTER — Ambulatory Visit: Payer: Self-pay | Admitting: *Deleted

## 2018-07-08 ENCOUNTER — Ambulatory Visit (INDEPENDENT_AMBULATORY_CARE_PROVIDER_SITE_OTHER): Payer: Medicaid Other | Admitting: Student

## 2018-07-08 VITALS — BP 104/78 | HR 90 | Ht 66.0 in | Wt 180.0 lb

## 2018-07-08 DIAGNOSIS — I340 Nonrheumatic mitral (valve) insufficiency: Secondary | ICD-10-CM

## 2018-07-08 DIAGNOSIS — I5022 Chronic systolic (congestive) heart failure: Secondary | ICD-10-CM | POA: Diagnosis not present

## 2018-07-08 DIAGNOSIS — Z9861 Coronary angioplasty status: Secondary | ICD-10-CM

## 2018-07-08 DIAGNOSIS — I236 Thrombosis of atrium, auricular appendage, and ventricle as current complications following acute myocardial infarction: Secondary | ICD-10-CM

## 2018-07-08 DIAGNOSIS — I2510 Atherosclerotic heart disease of native coronary artery without angina pectoris: Secondary | ICD-10-CM

## 2018-07-08 DIAGNOSIS — I255 Ischemic cardiomyopathy: Secondary | ICD-10-CM | POA: Diagnosis not present

## 2018-07-08 DIAGNOSIS — I959 Hypotension, unspecified: Secondary | ICD-10-CM

## 2018-07-08 DIAGNOSIS — E785 Hyperlipidemia, unspecified: Secondary | ICD-10-CM

## 2018-07-08 LAB — POCT INR: INR: 3.4 — AB (ref 2.0–3.0)

## 2018-07-08 NOTE — Patient Instructions (Signed)
Hold coumadin tonight then resume 7.5mg  daily except 5mg  on Sundays and Wednesdays.   Recheck in 3 weeks Instructions called to [facility]

## 2018-07-08 NOTE — Patient Instructions (Addendum)
Medication Instructions:   PLEASE TAKE AN EXTRA  TORSEMIDE 20 MG TODAY and Tomorrow. Then Resume Normal Dose!!  Labwork: none  Testing/Procedures: none  Follow-Up: Your physician recommends that you schedule a follow-up appointment in: 3 months    Any Other Special Instructions Will Be Listed Below (If Applicable).   INR today 3.4  If you need a refill on your cardiac medications before your next appointment, please call your pharmacy.

## 2018-07-09 ENCOUNTER — Encounter (HOSPITAL_COMMUNITY): Payer: Self-pay | Admitting: Cardiology

## 2018-07-09 ENCOUNTER — Ambulatory Visit (HOSPITAL_COMMUNITY)
Admission: RE | Admit: 2018-07-09 | Discharge: 2018-07-09 | Disposition: A | Discharge status: Home / Self Care | Payer: Medicaid Other | Source: Ambulatory Visit | Attending: Cardiology | Admitting: Cardiology

## 2018-07-09 ENCOUNTER — Other Ambulatory Visit: Payer: Self-pay

## 2018-07-09 VITALS — BP 113/77 | HR 82 | Wt 177.2 lb

## 2018-07-09 DIAGNOSIS — I5042 Chronic combined systolic (congestive) and diastolic (congestive) heart failure: Secondary | ICD-10-CM

## 2018-07-09 DIAGNOSIS — I5022 Chronic systolic (congestive) heart failure: Secondary | ICD-10-CM | POA: Insufficient documentation

## 2018-07-09 DIAGNOSIS — Z79899 Other long term (current) drug therapy: Secondary | ICD-10-CM | POA: Diagnosis not present

## 2018-07-09 DIAGNOSIS — I2510 Atherosclerotic heart disease of native coronary artery without angina pectoris: Secondary | ICD-10-CM | POA: Insufficient documentation

## 2018-07-09 DIAGNOSIS — E7800 Pure hypercholesterolemia, unspecified: Secondary | ICD-10-CM

## 2018-07-09 DIAGNOSIS — I110 Hypertensive heart disease with heart failure: Secondary | ICD-10-CM | POA: Insufficient documentation

## 2018-07-09 DIAGNOSIS — Z7901 Long term (current) use of anticoagulants: Secondary | ICD-10-CM | POA: Insufficient documentation

## 2018-07-09 DIAGNOSIS — Z9861 Coronary angioplasty status: Secondary | ICD-10-CM

## 2018-07-09 DIAGNOSIS — I739 Peripheral vascular disease, unspecified: Secondary | ICD-10-CM | POA: Diagnosis not present

## 2018-07-09 DIAGNOSIS — Z7984 Long term (current) use of oral hypoglycemic drugs: Secondary | ICD-10-CM | POA: Insufficient documentation

## 2018-07-09 DIAGNOSIS — E119 Type 2 diabetes mellitus without complications: Secondary | ICD-10-CM | POA: Diagnosis not present

## 2018-07-09 DIAGNOSIS — Z8673 Personal history of transient ischemic attack (TIA), and cerebral infarction without residual deficits: Secondary | ICD-10-CM | POA: Insufficient documentation

## 2018-07-09 DIAGNOSIS — Z8249 Family history of ischemic heart disease and other diseases of the circulatory system: Secondary | ICD-10-CM | POA: Insufficient documentation

## 2018-07-09 DIAGNOSIS — F329 Major depressive disorder, single episode, unspecified: Secondary | ICD-10-CM | POA: Insufficient documentation

## 2018-07-09 DIAGNOSIS — G4733 Obstructive sleep apnea (adult) (pediatric): Secondary | ICD-10-CM | POA: Diagnosis not present

## 2018-07-09 DIAGNOSIS — E785 Hyperlipidemia, unspecified: Secondary | ICD-10-CM | POA: Insufficient documentation

## 2018-07-09 DIAGNOSIS — Z823 Family history of stroke: Secondary | ICD-10-CM | POA: Diagnosis not present

## 2018-07-09 DIAGNOSIS — Z87891 Personal history of nicotine dependence: Secondary | ICD-10-CM | POA: Diagnosis not present

## 2018-07-09 DIAGNOSIS — I255 Ischemic cardiomyopathy: Secondary | ICD-10-CM | POA: Diagnosis not present

## 2018-07-09 LAB — BASIC METABOLIC PANEL
ANION GAP: 11 (ref 5–15)
BUN: 9 mg/dL (ref 8–23)
CALCIUM: 9.9 mg/dL (ref 8.9–10.3)
CHLORIDE: 98 mmol/L (ref 98–111)
CO2: 30 mmol/L (ref 22–32)
CREATININE: 1.24 mg/dL (ref 0.61–1.24)
GFR calc Af Amer: >60 mL/min (ref >60)
GFR calc non Af Amer: >60 mL/min (ref >60)
Glucose, Bld: 203 mg/dL — ABNORMAL HIGH (ref 70–99)
Potassium: 3.7 mmol/L (ref 3.5–5.1)
SODIUM: 139 mmol/L (ref 135–145)

## 2018-07-09 LAB — LIPID PANEL
CHOLESTEROL: 163 mg/dL (ref 0–200)
HDL: 47 mg/dL (ref >40)
LDL Cholesterol: 52 mg/dL (ref 0–99)
Total CHOL/HDL Ratio: 3.5 RATIO
Triglycerides: 322 mg/dL — ABNORMAL HIGH (ref <150)
VLDL: 64 mg/dL — ABNORMAL HIGH (ref 0–40)

## 2018-07-09 LAB — DIGOXIN LEVEL: DIGOXIN LVL: 0.4 ng/mL — AB (ref 0.8–2.0)

## 2018-07-09 MED ORDER — CARVEDILOL 6.25 MG PO TABS: 6.2500 mg | ORAL_TABLET | Freq: Two times a day (BID) | ORAL | 6 refills | Status: DC

## 2018-07-09 MED ORDER — SPIRONOLACTONE 25 MG PO TABS: 12.5000 mg | ORAL_TABLET | Freq: Every day | ORAL | 3 refills | Status: DC

## 2018-07-09 NOTE — Patient Instructions (Signed)
Stop Plavix  Start Spironolactone 12.5 mg (1/2 tab) daily  Increase Carvedilol to 6.25 mg Twice daily   Labs today  Labs in 10 days  Please follow up with Doroteo Bradford, our heart failure pharmacist in 3 weeks  Your physician recommends that you schedule a follow-up appointment in: 6 weeks with Dr Aundra Dubin

## 2018-07-10 NOTE — Progress Notes (Signed)
Advanced Heart Failure Clinic Note   Primary Cardiologist: Dr. Harl Bowie Primary HF: Dr. Aundra Dubin   HPI:  Keyonta Barradas Li is a 62 y.o. male with long history of CAD s/p multiple ACS episodes, HTN, HLD, tobacco abuse, DM, OSA, CVA and systolic CHF.   He has a history of CAD with NSTEMI in 2007 with BMS placement to his D1 and OM2. Also with history of STEMI in May 2013 at which time he had a BMS placed to his RCA. NSTEMI in January of 2014, cath showed CTO of left circumflex, unable to intervene upon. EF 55-60% in September 2016. Anterior STEMI 4/18, DES to pLAD.   Admitted 8/9 - 06/11/17 with chest pain. Cath as below. No targets for PCI, LAD stent patent. EF remained low at 20-25% as below. Now with St Jude ICD.   Admitted 4/16 though 02/17/2018. Admitted with NSTEMI. Underwent LHC with stable unchanged anatomy. Discharged to Assisted Living.   Admitted 7/2 - 04/30/18 for chest wall pain. Troponin negative.  Seen in ED 05/06/18 for same. Troponin again negative.  Prescribed a muscle relaxer  He presents today for followup of CHF and CAD.  He has had no recent chest pain.  He denies exertional dyspnea.  No orthopnea/PND. No lightheadedness. Limited mainly by right knee pain.    St Jude ICD interrogation: Stable thoracic impedance.   Labs (8/18): K 3.7, creatinine 1.42 Labs (9/18): digoxin < 0.2 Labs (11/18): K 3.8, creatinine 1.03 Labs (12/18): K 4, creatinine 1.27, digoxin 0.2, hgb 14.2, LDL 51, HDL 49 Labs (03/31/2018): K 3.8 Creatinine 1.1 dig level 0.3  Labs (8/19): K 3.5, creatinine 0.98, BNP 88, digoxin 0.4  ECG (8/19, personally reviewed): NSR, old anterior MI with residual anterior ST elevation V1-V3, anterolateral TWIs.   Review of systems complete and found to be negative unless listed in HPI.    Past Medical History 1. OSA: Supposed to be using CPAP.  2. HTN 3. CAD: Long history.  - NSTEMI 2007 with BMS D1 and OM2. - STEMI 5/13 with BMS RCA - NSTEMI 1/14 with CTO LCx and  PLV, unable to open LCx.  - STEMI 4/18 with LHC showing old TO LCx with collaterals, old TO PLV, 70% ostial D1, totally occluded proximal LAD with some collaterals => DES to LAD.  - LHC 8/18 with old TO LCx, old TO PLV, 60% mLAD, patent LAD stent.  - LHC 4/19 with totally occluded PLV, totally occluded LCx, patent LAD and D1 stents, 50% mid LAD.  4. Depression 5. Type II DM 6. Hyperlipidemia: History of rhabdomyolysis with statins, he is on Zetia.  7. Chronic systolic CHF: Ischemic cardiomyopathy.  Echo from 9/16 showed improvement in EF to 55-60%.  St Jude ICD.  - Echo (4/18) with EF 20-25%, dyskinetic apex, moderate-severe MR, severe LAE.  - Echo (8/18) with EF 20-25%, moderate to severe MR - Echo (4/19) witih EF 20-25%  8. PAD: - ABIs (4/18) with R ABI of 0.84 suggestive of mild disease.  L ABI of 1.27 (Normal flow at rest) - ABIs (5/19): abnormal TBI on right.  9. LV thrombus  Current Outpatient Medications  Medication Sig Dispense Refill  . acetaminophen (TYLENOL) 325 MG tablet Take 2 tablets (650 mg total) by mouth every 6 (six) hours as needed for mild pain, fever or headache. 40 tablet 0  . carvedilol (COREG) 6.25 MG tablet Take 1 tablet (6.25 mg total) by mouth 2 (two) times daily with a meal. 60 tablet 6  . digoxin (  LANOXIN) 0.125 MG tablet Take 1 tablet (0.125 mg total) by mouth daily. 30 tablet 3  . Evolocumab (REPATHA SURECLICK) 485 MG/ML SOAJ Inject 1 pen into the skin every 14 (fourteen) days. 2 pen 11  . ezetimibe (ZETIA) 10 MG tablet Take 1 tablet (10 mg total) by mouth daily. 30 tablet 3  . gabapentin (NEURONTIN) 100 MG capsule Take 1 capsule (100 mg total) by mouth 2 (two) times daily. 60 capsule 0  . glipiZIDE (GLUCOTROL XL) 10 MG 24 hr tablet Take 1 tablet (10 mg total) by mouth daily. 30 tablet 1  . losartan (COZAAR) 25 MG tablet Take 0.5 tablets (12.5 mg total) by mouth daily. 15 tablet 3  . meclizine (ANTIVERT) 12.5 MG tablet Take 1 tablet (12.5 mg total) by mouth 3  (three) times daily as needed for dizziness. 30 tablet 0  . nicotine (NICODERM CQ - DOSED IN MG/24 HOURS) 21 mg/24hr patch Place 1 patch (21 mg total) onto the skin daily. 28 patch 0  . nitroGLYCERIN (NITROSTAT) 0.4 MG SL tablet PLACE ONE (1) TABLET UNDER TONGUE EVERY 5 MINUTES UP TO (3) DOSES AS NEEDED FOR CHEST PAIN. IF NO RELIEF, CONTACT MD. (Patient taking differently: 0.4 mg every 5 (five) minutes as needed for chest pain. ) 25 tablet 3  . pantoprazole (PROTONIX) 40 MG tablet Take 1 tablet (40 mg total) by mouth 2 (two) times daily. 60 tablet 1  . potassium chloride SA (K-DUR,KLOR-CON) 20 MEQ tablet Take 1 tablet (20 mEq total) by mouth daily. 30 tablet 3  . torsemide (DEMADEX) 20 MG tablet Take 1 tablet (20 mg total) by mouth daily.    [name] Kitchen warfarin (COUMADIN) 5 MG tablet Hold coumadin tonight then decrease dose to 7.5mg  daily except 5mg  on Sundays and Wednesdays (Patient taking differently: Take 5-7.5 mg by mouth See admin instructions. 7.5mg  daily except 5mg  on Sundays and Wednesdays) 45 tablet 11  . spironolactone (ALDACTONE) 25 MG tablet Take 0.5 tablets (12.5 mg total) by mouth at bedtime. 15 tablet 3   No current facility-administered medications for this encounter.     Allergies  Allergen Reactions  . Pork-Derived Products Other (See Comments)    Does not eat for religious reasons - okay with using IV heparin      Social History   Socioeconomic History  . Marital status: Single    Spouse name: Not on file  . Number of children: 1  . Years of education: Not on file  . Highest education level: Not on file  Occupational History  . Not on file  Social Needs  . Financial resource strain: Not on file  . Food insecurity:    Worry: Not on file    Inability: Not on file  . Transportation needs:    Medical: Not on file    Non-medical: Not on file  Tobacco Use  . Smoking status: Former Smoker    Packs/day: 0.50    Years: 20.00    Pack years: 10.00    Types: Cigarettes, Cigars     Start date: 07/21/1977    Last attempt to quit: 10/15/2017    Years since quitting: 0.7  . Smokeless tobacco: Never Used  Substance and Sexual Activity  . Alcohol use: Yes    Comment: 09/17/2017 "nothing since 05/2017"  . Drug use: No    Comment: previously incarcerated for drug related offense.  [name] Kitchen Sexual activity: Not Currently  Lifestyle  . Physical activity:    Days per week: Not on file  Minutes per session: Not on file  . Stress: Not on file  Relationships  . Social connections:    Talks on phone: Not on file    Gets together: Not on file    Attends religious service: Not on file    Active member of club or organization: Not on file    Attends meetings of clubs or organizations: Not on file    Relationship status: Not on file  . Intimate partner violence:    Fear of current or ex partner: Not on file    Emotionally abused: Not on file    Physically abused: Not on file    Forced sexual activity: Not on file  Other Topics Concern  . Not on file  Social History Narrative  . Not on file      Family History  Problem Relation Age of Onset  . Stroke Mother   . Heart attack Mother   . Heart attack Father   . Stroke Sister   . Heart attack Sister   . Heart attack Brother   . Stroke Brother   . Liver disease Neg Hx   . Colon cancer Neg Hx    Vitals:   07/09/18 1102  BP: 113/77  Pulse: 82  SpO2: 99%  Weight: 80.4 kg (177 lb 4 oz)   Wt Readings from Last 3 Encounters:  07/09/18 80.4 kg (177 lb 4 oz)  07/08/18 81.6 kg (180 lb)  06/16/18 77.1 kg (170 lb)   PHYSICAL EXAM: General: NAD Neck: No JVD, no thyromegaly or thyroid nodule.  Lungs: Clear to auscultation bilaterally with normal respiratory effort. CV: Nondisplaced PMI.  Heart regular S1/S2, no S3/S4, no murmur.  No peripheral edema.  No carotid bruit.  Normal pedal pulses.  Abdomen: Soft, nontender, no hepatosplenomegaly, no distention.  Skin: Intact without lesions or rashes.  Neurologic: Alert and  oriented x 3.  Psych: Normal affect. Extremities: No clubbing or cyanosis.  HEENT: Normal.   ASSESSMENT & PLAN:  1. CAD:  Extensive prior history of CAD. He has known total occlusion LCx and PLV.  Last cath in 4/19 showed patent stents in LAD and D1. No chest pain.  - Continue coumadin with LV thrombus.   - It has been > 1 year since last PCI, he can stop Plavix at this point.   - No statin given history of rhabdomyolysis. He is now on Repatha and Zetia, check lipids today.  2. Chronic systolic UJW:[id number] cardiomyopathy, St Jude ICD. Echo 01/2018 EF 20-25% Grade IDD. NYHA class II symptoms.  Not volume overloaded by exam or Corevue.  - Continue torsemide 20 mg daily.   - Increase Coreg to 6.25 mg bid.  - Continue losartan 12.5 mg daily.  - Add back spironolactone 12.5 daily.  BMET today and in 10 days.  - Continue Digoxin 0.125 daily, check level today.  - Reinforced fluid restriction to < 2 L daily, sodium restriction to less than 2000 mg daily, and the importance of daily weights.   3. Diabetes: Per PCP.  4. Smoking: He has quit smoking.  5. PAD: No claudication, foot ulcers.   6. LV thrombus:  On coumadin as above. Denies bleeding problems.  7. 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  - On warfarin for LV thrombus.   See HF pharmacist in 3 wks, see me in 6 wks.   [name], MD 07/10/18

## 2018-07-11 ENCOUNTER — Telehealth (HOSPITAL_COMMUNITY): Payer: Self-pay | Admitting: *Deleted

## 2018-07-11 MED ORDER — ICOSAPENT ETHYL 1 G PO CAPS: 2.0000 g | ORAL_CAPSULE | Freq: Two times a day (BID) | ORAL | 3 refills | Status: DC

## 2018-07-11 NOTE — Telephone Encounter (Signed)
Result Notes for Digoxin level   Notes recorded by Darron Doom, RN on 07/11/2018 at 2:59 PM EDT Called [facility] and spoke with his med tech, she's aware and has added new med to patient's chart. Medication sent to [facility]. ------  Notes recorded by Larey Dresser, MD on 07/10/2018 at 9:21 PM EDT Recommend Vascepa 2 g bid for elevated triglycerides.

## 2018-07-14 ENCOUNTER — Telehealth (HOSPITAL_COMMUNITY): Payer: Self-pay | Admitting: *Deleted

## 2018-07-14 DIAGNOSIS — E7800 Pure hypercholesterolemia, unspecified: Secondary | ICD-10-CM

## 2018-07-14 MED ORDER — FENOFIBRATE 145 MG PO TABS: 145.0000 mg | ORAL_TABLET | Freq: Every day | ORAL | 6 refills | Status: DC

## 2018-07-14 NOTE — Telephone Encounter (Signed)
Pharmacy called to let us know the Vascepa we ordered is not covered by pt's Medicaid.  They state medicaid will only pay for Tricor or Lopid.  Will send to Dr Aundra Dubin for further recommendation

## 2018-07-14 NOTE — Telephone Encounter (Signed)
fenofibrobate 45 mg daily. Lipids 2 months.

## 2018-07-14 NOTE — Telephone Encounter (Signed)
Patient aware Order faxed to facility [phone] ([facility])

## 2018-07-16 ENCOUNTER — Other Ambulatory Visit (HOSPITAL_COMMUNITY): Payer: Self-pay | Admitting: *Deleted

## 2018-07-16 MED ORDER — TORSEMIDE 20 MG PO TABS: 20.0000 mg | ORAL_TABLET | Freq: Every day | ORAL | 2 refills | Status: DC

## 2018-07-17 ENCOUNTER — Ambulatory Visit (INDEPENDENT_AMBULATORY_CARE_PROVIDER_SITE_OTHER): Payer: Medicaid Other | Admitting: *Deleted

## 2018-07-17 DIAGNOSIS — I255 Ischemic cardiomyopathy: Secondary | ICD-10-CM

## 2018-07-17 DIAGNOSIS — I5022 Chronic systolic (congestive) heart failure: Secondary | ICD-10-CM

## 2018-07-17 NOTE — Progress Notes (Signed)
Remote ICD transmission.   

## 2018-07-21 ENCOUNTER — Ambulatory Visit (HOSPITAL_COMMUNITY)
Admission: RE | Admit: 2018-07-21 | Discharge: 2018-07-21 | Disposition: A | Discharge status: Home / Self Care | Payer: Medicaid Other | Source: Ambulatory Visit | Attending: Internal Medicine | Admitting: Internal Medicine

## 2018-07-21 DIAGNOSIS — E7800 Pure hypercholesterolemia, unspecified: Secondary | ICD-10-CM | POA: Diagnosis not present

## 2018-07-21 DIAGNOSIS — I5042 Chronic combined systolic (congestive) and diastolic (congestive) heart failure: Secondary | ICD-10-CM

## 2018-07-21 LAB — LIPID PANEL
Cholesterol: 142 mg/dL (ref 0–200)
HDL: 48 mg/dL (ref >40)
LDL Cholesterol: 64 mg/dL (ref 0–99)
Total CHOL/HDL Ratio: 3 RATIO
Triglycerides: 151 mg/dL — ABNORMAL HIGH (ref <150)
VLDL: 30 mg/dL (ref 0–40)

## 2018-07-21 LAB — BASIC METABOLIC PANEL
Anion gap: 12 (ref 5–15)
BUN: 15 mg/dL (ref 8–23)
CO2: 26 mmol/L (ref 22–32)
Calcium: 9.6 mg/dL (ref 8.9–10.3)
Chloride: 99 mmol/L (ref 98–111)
Creatinine, Ser: 1.34 mg/dL — ABNORMAL HIGH (ref 0.61–1.24)
GFR calc Af Amer: >60 mL/min (ref >60)
GFR calc non Af Amer: 55 mL/min — ABNORMAL LOW (ref >60)
Glucose, Bld: 334 mg/dL — ABNORMAL HIGH (ref 70–99)
Potassium: 3.9 mmol/L (ref 3.5–5.1)
Sodium: 137 mmol/L (ref 135–145)

## 2018-07-28 ENCOUNTER — Other Ambulatory Visit: Payer: Self-pay | Admitting: Internal Medicine

## 2018-07-29 ENCOUNTER — Ambulatory Visit (INDEPENDENT_AMBULATORY_CARE_PROVIDER_SITE_OTHER): Payer: Medicaid Other | Admitting: *Deleted

## 2018-07-29 DIAGNOSIS — Z5181 Encounter for therapeutic drug level monitoring: Secondary | ICD-10-CM | POA: Diagnosis not present

## 2018-07-29 DIAGNOSIS — I2129 ST elevation (STEMI) myocardial infarction involving other sites: Secondary | ICD-10-CM | POA: Diagnosis not present

## 2018-07-29 LAB — POCT INR: INR: 4.8 — AB (ref 2.0–3.0)

## 2018-07-29 MED ORDER — WARFARIN SODIUM 5 MG PO TABS: ORAL_TABLET | ORAL | 11 refills | Status: DC

## 2018-07-29 NOTE — Patient Instructions (Signed)
Hold coumadin tonight, take 1/2 tablet tomorrow night then decrease dose to 1 tablet daily except 1 1/2 tablets on Mondays and Thursdays.   Recheck in 2 weeks Orders sent [facility]

## 2018-07-30 ENCOUNTER — Inpatient Hospital Stay (HOSPITAL_COMMUNITY): Admission: RE | Admit: 2018-07-30 | Payer: Medicaid Other | Source: Ambulatory Visit

## 2018-07-30 ENCOUNTER — Ambulatory Visit (HOSPITAL_COMMUNITY)
Admission: RE | Admit: 2018-07-30 | Discharge: 2018-07-30 | Disposition: A | Discharge status: Home / Self Care | Payer: Medicaid Other | Source: Ambulatory Visit | Attending: Internal Medicine | Admitting: Internal Medicine

## 2018-07-30 ENCOUNTER — Telehealth: Payer: Self-pay | Admitting: Physician Assistant

## 2018-07-30 VITALS — BP 112/80 | HR 86 | Wt 183.8 lb

## 2018-07-30 DIAGNOSIS — I252 Old myocardial infarction: Secondary | ICD-10-CM | POA: Diagnosis not present

## 2018-07-30 DIAGNOSIS — G4733 Obstructive sleep apnea (adult) (pediatric): Secondary | ICD-10-CM | POA: Insufficient documentation

## 2018-07-30 DIAGNOSIS — Z8673 Personal history of transient ischemic attack (TIA), and cerebral infarction without residual deficits: Secondary | ICD-10-CM | POA: Insufficient documentation

## 2018-07-30 DIAGNOSIS — Z8249 Family history of ischemic heart disease and other diseases of the circulatory system: Secondary | ICD-10-CM | POA: Diagnosis not present

## 2018-07-30 DIAGNOSIS — Z95810 Presence of automatic (implantable) cardiac defibrillator: Secondary | ICD-10-CM

## 2018-07-30 DIAGNOSIS — I2129 ST elevation (STEMI) myocardial infarction involving other sites: Secondary | ICD-10-CM | POA: Diagnosis not present

## 2018-07-30 DIAGNOSIS — I110 Hypertensive heart disease with heart failure: Secondary | ICD-10-CM | POA: Diagnosis not present

## 2018-07-30 DIAGNOSIS — M6282 Rhabdomyolysis: Secondary | ICD-10-CM | POA: Diagnosis not present

## 2018-07-30 DIAGNOSIS — I2510 Atherosclerotic heart disease of native coronary artery without angina pectoris: Secondary | ICD-10-CM | POA: Insufficient documentation

## 2018-07-30 DIAGNOSIS — Z79899 Other long term (current) drug therapy: Secondary | ICD-10-CM | POA: Diagnosis not present

## 2018-07-30 DIAGNOSIS — Z7984 Long term (current) use of oral hypoglycemic drugs: Secondary | ICD-10-CM | POA: Diagnosis not present

## 2018-07-30 DIAGNOSIS — R0789 Other chest pain: Secondary | ICD-10-CM | POA: Insufficient documentation

## 2018-07-30 DIAGNOSIS — Z7901 Long term (current) use of anticoagulants: Secondary | ICD-10-CM | POA: Insufficient documentation

## 2018-07-30 DIAGNOSIS — R0601 Orthopnea: Secondary | ICD-10-CM | POA: Diagnosis not present

## 2018-07-30 DIAGNOSIS — R42 Dizziness and giddiness: Secondary | ICD-10-CM | POA: Insufficient documentation

## 2018-07-30 DIAGNOSIS — E785 Hyperlipidemia, unspecified: Secondary | ICD-10-CM | POA: Insufficient documentation

## 2018-07-30 DIAGNOSIS — R05 Cough: Secondary | ICD-10-CM | POA: Diagnosis not present

## 2018-07-30 DIAGNOSIS — I5042 Chronic combined systolic (congestive) and diastolic (congestive) heart failure: Secondary | ICD-10-CM | POA: Diagnosis not present

## 2018-07-30 DIAGNOSIS — E119 Type 2 diabetes mellitus without complications: Secondary | ICD-10-CM | POA: Diagnosis not present

## 2018-07-30 DIAGNOSIS — F329 Major depressive disorder, single episode, unspecified: Secondary | ICD-10-CM | POA: Insufficient documentation

## 2018-07-30 LAB — CUP PACEART REMOTE DEVICE CHECK
Battery Remaining Percentage: 91 %
Date Time Interrogation Session: 20190919060016
HIGH POWER IMPEDANCE MEASURED VALUE: 68 Ohm
HIGH POWER IMPEDANCE MEASURED VALUE: 68 Ohm
Lead Channel Impedance Value: 350 Ohm
Lead Channel Pacing Threshold Pulse Width: 0.5 ms
Lead Channel Sensing Intrinsic Amplitude: 12 mV
Lead Channel Setting Pacing Amplitude: 2.5 V
Lead Channel Setting Pacing Pulse Width: 0.5 ms
MDC IDC LEAD IMPLANT DT: 20181120
MDC IDC LEAD LOCATION: 753860
MDC IDC MSMT BATTERY REMAINING LONGEVITY: 96 mo
MDC IDC MSMT BATTERY VOLTAGE: 3.13 V
MDC IDC MSMT LEADCHNL RV PACING THRESHOLD AMPLITUDE: 0.75 V
MDC IDC PG IMPLANT DT: 20181120
MDC IDC PG SERIAL: 9786940
MDC IDC SET LEADCHNL RV SENSING SENSITIVITY: 0.5 mV
MDC IDC STAT BRADY RV PERCENT PACED: 1 %

## 2018-07-30 LAB — CBC
HEMATOCRIT: 41.7 % (ref 39.0–52.0)
HEMOGLOBIN: 13.2 g/dL (ref 13.0–17.0)
MCH: 25.5 pg — ABNORMAL LOW (ref 26.0–34.0)
MCHC: 31.7 g/dL (ref 30.0–36.0)
MCV: 80.7 fL (ref 78.0–100.0)
Platelets: 234 10*3/uL (ref 150–400)
RBC: 5.17 MIL/uL (ref 4.22–5.81)
RDW: 15.5 % (ref 11.5–15.5)
WBC: 6.1 10*3/uL (ref 4.0–10.5)

## 2018-07-30 LAB — BASIC METABOLIC PANEL
Anion gap: 11 (ref 5–15)
BUN: 10 mg/dL (ref 8–23)
CHLORIDE: 99 mmol/L (ref 98–111)
CO2: 26 mmol/L (ref 22–32)
Calcium: 9.2 mg/dL (ref 8.9–10.3)
Creatinine, Ser: 1.25 mg/dL — ABNORMAL HIGH (ref 0.61–1.24)
GFR calc Af Amer: >60 mL/min (ref >60)
GFR calc non Af Amer: >60 mL/min (ref >60)
GLUCOSE: 285 mg/dL — AB (ref 70–99)
POTASSIUM: 3.9 mmol/L (ref 3.5–5.1)
Sodium: 136 mmol/L (ref 135–145)

## 2018-07-30 MED ORDER — SPIRONOLACTONE 25 MG PO TABS: 25.0000 mg | ORAL_TABLET | Freq: Every day | ORAL | 3 refills | Status: DC

## 2018-07-30 NOTE — Patient Instructions (Signed)
Take an extra 20 mg of Torsemide 07/31/18  INCREASE Spironolactone to 25 mg, one tab daily  Labs needed in 7 days (will be done by the facility and results to [phone])   Your physician recommends that you schedule a follow-up appointment in: Keep follow up as scheduled with Dr Aundra Dubin  Do the following things EVERYDAY: 1) Weigh yourself in the morning before breakfast. Write it down and keep it in a log. 2) Take your medicines as prescribed 3) Eat low salt foods-Limit salt (sodium) to 2000 mg per day.  4) Stay as active as you can everyday 5) Limit all fluids for the day to less than 2 liters

## 2018-07-30 NOTE — Telephone Encounter (Signed)
Called by staff at [facility] because pt gained 8 lbs overnight.   They gave him an extra torsemide 20 mg, total 40 mg this am.  Has appt today at CHF clinic for pharmacist med titration. They say he will keep this.  Called the clinic to let them know of wt gain, they will assess him when he gets there.   Rosaria Ferries, PA-C 07/30/2018 8:11 AM Beeper [phone]

## 2018-07-30 NOTE — Progress Notes (Signed)
Advanced Heart Failure Clinic Note  PCP: Dr Alda Ponder Primary Cardiologist: Dr. Harl Bowie Primary HF: Dr. Aundra Dubin   HPI: Daniel Li is a 62 y.o. male with long history of CAD s/p multiple ACS episodes, HTN, HLD, tobacco abuse, DM, OSA, CVA and systolic CHF.   He has a history of CAD with NSTEMI in 2007 with BMS placement to his D1 and OM2. Also with history of STEMI in May 2013 at which time he had a BMS placed to his RCA. NSTEMI in January of 2014, cath showed CTO of left circumflex, unable to intervene upon. EF 55-60% in September 2016. Anterior STEMI 4/18, DES to pLAD.   Admitted 8/9 - 06/11/17 with chest pain. Cath as below. No targets for PCI, LAD stent patent. EF remained low at 20-25% as below. Now with St Jude ICD.   Admitted 4/16 though 02/17/2018. Admitted with NSTEMI. Underwent LHC with stable unchanged anatomy. Discharged to Assisted Living.   Admitted 7/2 - 04/30/18 for chest wall pain. Troponin negative.  Seen in ED 05/06/18 for same. Troponin again negative.  Prescribed a muscle relaxer  Today he returns for HF follow up. Complaining of dizziness. Denies PND. + Orthopnea. Coughing at night. Coughing up white sputum. No fever or chills. Had brief episode of chest discomfort. Weight trending up 179-186 pounds. Yesterday he had soup and today he had sausage. Today he took extra 20 mg torsemide. No bleeding problems. He is living at [facility].   St Jude ICD interrogation: Impendance starting to trend down.   Labs (8/18): K 3.7, creatinine 1.42 Labs (9/18): digoxin < 0.2 Labs (11/18): K 3.8, creatinine 1.03 Labs (12/18): K 4, creatinine 1.27, digoxin 0.2, hgb 14.2, LDL 51, HDL 49 Labs (03/31/2018): K 3.8 Creatinine 1.1 dig level 0.3  Labs (8/19): K 3.5, creatinine 0.98, BNP 88, digoxin 0.4 Labs (07/21/2018): K 3.9 Creatinine 1.34   ECG (8/19, personally reviewed): NSR, old anterior MI with residual anterior ST elevation V1-V3, anterolateral TWIs.   Review of systems  complete and found to be negative unless listed in HPI.    Past Medical History 1. OSA: Supposed to be using CPAP.  2. HTN 3. CAD: Long history.  - NSTEMI 2007 with BMS D1 and OM2. - STEMI 5/13 with BMS RCA - NSTEMI 1/14 with CTO LCx and PLV, unable to open LCx.  - STEMI 4/18 with LHC showing old TO LCx with collaterals, old TO PLV, 70% ostial D1, totally occluded proximal LAD with some collaterals => DES to LAD.  - LHC 8/18 with old TO LCx, old TO PLV, 60% mLAD, patent LAD stent.  - LHC 4/19 with totally occluded PLV, totally occluded LCx, patent LAD and D1 stents, 50% mid LAD.  4. Depression 5. Type II DM 6. Hyperlipidemia: History of rhabdomyolysis with statins, he is on Zetia.  7. Chronic systolic CHF: Ischemic cardiomyopathy.  Echo from 9/16 showed improvement in EF to 55-60%.  St Jude ICD.  - Echo (4/18) with EF 20-25%, dyskinetic apex, moderate-severe MR, severe LAE.  - Echo (8/18) with EF 20-25%, moderate to severe MR - Echo (4/19) witih EF 20-25%  8. PAD: - ABIs (4/18) with R ABI of 0.84 suggestive of mild disease.  L ABI of 1.27 (Normal flow at rest) - ABIs (5/19): abnormal TBI on right.  9. LV thrombus  Current Outpatient Medications  Medication Sig Dispense Refill  . acetaminophen (TYLENOL) 325 MG tablet Take 2 tablets (650 mg total) by mouth every 6 (six) hours as  needed for mild pain, fever or headache. 40 tablet 0  . carvedilol (COREG) 6.25 MG tablet Take 1 tablet (6.25 mg total) by mouth 2 (two) times daily with a meal. 60 tablet 6  . digoxin (LANOXIN) 0.125 MG tablet Take 1 tablet (0.125 mg total) by mouth daily. 30 tablet 3  . Evolocumab (REPATHA SURECLICK) 314 MG/ML SOAJ Inject 1 pen into the skin every 14 (fourteen) days. 2 pen 11  . ezetimibe (ZETIA) 10 MG tablet Take 1 tablet (10 mg total) by mouth daily. 30 tablet 3  . fenofibrate (TRICOR) 145 MG tablet Take 1 tablet (145 mg total) by mouth daily. 30 tablet 6  . gabapentin (NEURONTIN) 100 MG capsule Take 1  capsule (100 mg total) by mouth 2 (two) times daily. 60 capsule 0  . glipiZIDE (GLUCOTROL XL) 10 MG 24 hr tablet Take 1 tablet (10 mg total) by mouth daily. 30 tablet 1  . losartan (COZAAR) 25 MG tablet Take 0.5 tablets (12.5 mg total) by mouth daily. 15 tablet 3  . meclizine (ANTIVERT) 12.5 MG tablet Take 1 tablet (12.5 mg total) by mouth 3 (three) times daily as needed for dizziness. 30 tablet 0  . nicotine (NICODERM CQ - DOSED IN MG/24 HOURS) 21 mg/24hr patch Place 1 patch (21 mg total) onto the skin daily. 28 patch 0  . pantoprazole (PROTONIX) 40 MG tablet Take 1 tablet (40 mg total) by mouth 2 (two) times daily. 60 tablet 1  . potassium chloride SA (K-DUR,KLOR-CON) 20 MEQ tablet Take 1 tablet (20 mEq total) by mouth daily. 30 tablet 3  . spironolactone (ALDACTONE) 25 MG tablet Take 0.5 tablets (12.5 mg total) by mouth at bedtime. 15 tablet 3  . torsemide (DEMADEX) 20 MG tablet Take 1 tablet (20 mg total) by mouth daily. 30 tablet 2  . warfarin (COUMADIN) 5 MG tablet Hold coumadin tonight, take 2.5mg  tomorrow night then decrease dose to 5mg  daily except 7.5mg  on Mondays and Thursdays 45 tablet 11  . Icosapent Ethyl (VASCEPA) 1 g CAPS Take 2 capsules (2 g total) by mouth 2 (two) times daily. (Patient not taking: Reported on 07/30/2018) 120 capsule 3  . nitroGLYCERIN (NITROSTAT) 0.4 MG SL tablet PLACE ONE (1) TABLET UNDER TONGUE EVERY 5 MINUTES UP TO (3) DOSES AS NEEDED FOR CHEST PAIN. IF NO RELIEF, CONTACT MD. (Patient not taking: No sig reported) 25 tablet 3   No current facility-administered medications for this encounter.     Allergies  Allergen Reactions  . Pork-Derived Products Other (See Comments)    Does not eat for religious reasons - okay with using IV heparin      Social History   Socioeconomic History  . Marital status: Single    Spouse name: Not on file  . Number of children: 1  . Years of education: Not on file  . Highest education level: Not on file  Occupational  History  . Not on file  Social Needs  . Financial resource strain: Not on file  . Food insecurity:    Worry: Not on file    Inability: Not on file  . Transportation needs:    Medical: Not on file    Non-medical: Not on file  Tobacco Use  . Smoking status: Former Smoker    Packs/day: 0.50    Years: 20.00    Pack years: 10.00    Types: Cigarettes, Cigars    Start date: 07/21/1977    Last attempt to quit: 10/15/2017    Years since  quitting: 0.7  . Smokeless tobacco: Never Used  Substance and Sexual Activity  . Alcohol use: Yes    Comment: 09/17/2017 "nothing since 05/2017"  . Drug use: No    Comment: previously incarcerated for drug related offense.  [name] Kitchen Sexual activity: Not Currently  Lifestyle  . Physical activity:    Days per week: Not on file    Minutes per session: Not on file  . Stress: Not on file  Relationships  . Social connections:    Talks on phone: Not on file    Gets together: Not on file    Attends religious service: Not on file    Active member of club or organization: Not on file    Attends meetings of clubs or organizations: Not on file    Relationship status: Not on file  . Intimate partner violence:    Fear of current or ex partner: Not on file    Emotionally abused: Not on file    Physically abused: Not on file    Forced sexual activity: Not on file  Other Topics Concern  . Not on file  Social History Narrative  . Not on file      Family History  Problem Relation Age of Onset  . Stroke Mother   . Heart attack Mother   . Heart attack Father   . Stroke Sister   . Heart attack Sister   . Heart attack Brother   . Stroke Brother   . Liver disease Neg Hx   . Colon cancer Neg Hx    Vitals:   07/30/18 1014  BP: 112/80  Pulse: 86  SpO2: 96%  Weight: 83.4 kg (183 lb 12.8 oz)   Wt Readings from Last 3 Encounters:  07/30/18 83.4 kg (183 lb 12.8 oz)  07/09/18 80.4 kg (177 lb 4 oz)  07/08/18 81.6 kg (180 lb)   Orthostatics Sitting  102/74 Standing 100/72   PHYSICAL EXAM: General:  Well appearing. No resp difficulty HEENT: normal Neck: supple. JVP 9-10. Carotids 2+ bilat; no bruits. No lymphadenopathy or thryomegaly appreciated. Cor: PMI nondisplaced. Regular rate & rhythm. No rubs, gallops or murmurs. Lungs: LLL crackles Abdomen: soft, nontender, + distended. No hepatosplenomegaly. No bruits or masses. Good bowel sounds. Extremities: no cyanosis, clubbing, rash, edema Neuro: alert & orientedx3, cranial nerves grossly intact. moves all 4 extremities w/o difficulty. Affect pleasant  ASSESSMENT & PLAN:  1. CAD:  Extensive prior history of CAD. He has known total occlusion LCx and PLV.  Last cath in 4/19 showed patent stents in LAD and D1 - Continue coumadin with LV thrombus.   -Plavix stopped after last visit.  - No statin given history of rhabdomyolysis. He is now on Repatha and Zetia - He was instructed to call 911 for chest pain.  2. Chronic systolic HLK:TGYBWLSL cardiomyopathy, St Jude ICD. Echo 01/2018 EF 20-25% Grade IDD.  NYHA II-III - Volume status mildly elevated. Vest Reading 35%. Impedance trending down. Suspect this is related to high sodium diet.   -Continue 20 mg torsemide daily and he was instructed to take an extra 20 mg torsemide tomorrow.   - Continue torsemide 20 mg daily.   -Continue Coreg to 6.25 mg bid.  - Continue losartan 12.5 mg daily.  - Increase spiro to 25 mg daily. Check BMET  - Continue Digoxin 0.125 daily, dig level  0.4 .  3. Diabetes: Per PCP.  4. Smoking: He has quit smoking.  5. PAD: No claudication, foot ulcers.   6.  LV thrombus:  On coumadin as above. No bleeding problems.  Check CBC today.   7. 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  - On warfarin for LV thrombus.   Follow up with Dr Aundra Dubin in 3 weeks.  Greater than 50% of the 25 minute visit was spent in counseling/coordination of care regarding disease state education, salt/fluid restriction, sliding scale  diuretics, and medication compliance. Discussed low salt food options.   Darrick Grinder, NP 07/30/18

## 2018-08-06 ENCOUNTER — Other Ambulatory Visit (HOSPITAL_COMMUNITY): Payer: Self-pay

## 2018-08-06 MED ORDER — EVOLOCUMAB 140 MG/ML ~~LOC~~ SOAJ: 1.0000 "pen " | SUBCUTANEOUS | 3 refills | Status: DC

## 2018-08-07 ENCOUNTER — Telehealth: Payer: Self-pay

## 2018-08-07 NOTE — Telephone Encounter (Signed)
Confirmed remote transmission w/ pt nurse.   

## 2018-08-12 ENCOUNTER — Ambulatory Visit (INDEPENDENT_AMBULATORY_CARE_PROVIDER_SITE_OTHER): Payer: Medicaid Other | Admitting: *Deleted

## 2018-08-12 DIAGNOSIS — I2129 ST elevation (STEMI) myocardial infarction involving other sites: Secondary | ICD-10-CM

## 2018-08-12 DIAGNOSIS — Z5181 Encounter for therapeutic drug level monitoring: Secondary | ICD-10-CM

## 2018-08-12 LAB — POCT INR: INR: 3.8 — AB (ref 2.0–3.0)

## 2018-08-12 MED ORDER — WARFARIN SODIUM 5 MG PO TABS: ORAL_TABLET | ORAL | 11 refills | Status: DC

## 2018-08-12 NOTE — Patient Instructions (Signed)
Hold coumadin tonight then decrease dose to 1 tablet daily.   Recheck in 2 weeks Orders sent [facility]

## 2018-08-12 NOTE — Progress Notes (Signed)
No ICM remote transmission received for 08/07/2018 and next ICM transmission scheduled for 09/15/2018.

## 2018-08-20 ENCOUNTER — Other Ambulatory Visit: Payer: Self-pay | Admitting: Student

## 2018-08-21 ENCOUNTER — Other Ambulatory Visit: Payer: Self-pay

## 2018-08-21 ENCOUNTER — Emergency Department (HOSPITAL_COMMUNITY): Payer: Medicaid Other

## 2018-08-21 ENCOUNTER — Inpatient Hospital Stay (HOSPITAL_COMMUNITY)
Admission: EM | Admit: 2018-08-21 | Discharge: 2018-08-26 | DRG: 247 | Disposition: A | Discharge status: Home / Self Care | Payer: Medicaid Other | Attending: Cardiovascular Disease | Admitting: Cardiovascular Disease

## 2018-08-21 ENCOUNTER — Encounter (HOSPITAL_COMMUNITY): Payer: Self-pay

## 2018-08-21 DIAGNOSIS — F329 Major depressive disorder, single episode, unspecified: Secondary | ICD-10-CM | POA: Diagnosis present

## 2018-08-21 DIAGNOSIS — Z7902 Long term (current) use of antithrombotics/antiplatelets: Secondary | ICD-10-CM

## 2018-08-21 DIAGNOSIS — Z8249 Family history of ischemic heart disease and other diseases of the circulatory system: Secondary | ICD-10-CM

## 2018-08-21 DIAGNOSIS — Z91018 Allergy to other foods: Secondary | ICD-10-CM

## 2018-08-21 DIAGNOSIS — I252 Old myocardial infarction: Secondary | ICD-10-CM

## 2018-08-21 DIAGNOSIS — Z87891 Personal history of nicotine dependence: Secondary | ICD-10-CM

## 2018-08-21 DIAGNOSIS — I340 Nonrheumatic mitral (valve) insufficiency: Secondary | ICD-10-CM | POA: Diagnosis present

## 2018-08-21 DIAGNOSIS — Z9861 Coronary angioplasty status: Secondary | ICD-10-CM

## 2018-08-21 DIAGNOSIS — Z8673 Personal history of transient ischemic attack (TIA), and cerebral infarction without residual deficits: Secondary | ICD-10-CM

## 2018-08-21 DIAGNOSIS — I110 Hypertensive heart disease with heart failure: Secondary | ICD-10-CM | POA: Diagnosis present

## 2018-08-21 DIAGNOSIS — K219 Gastro-esophageal reflux disease without esophagitis: Secondary | ICD-10-CM | POA: Diagnosis present

## 2018-08-21 DIAGNOSIS — Z95810 Presence of automatic (implantable) cardiac defibrillator: Secondary | ICD-10-CM

## 2018-08-21 DIAGNOSIS — R7989 Other specified abnormal findings of blood chemistry: Secondary | ICD-10-CM | POA: Diagnosis present

## 2018-08-21 DIAGNOSIS — I25119 Atherosclerotic heart disease of native coronary artery with unspecified angina pectoris: Secondary | ICD-10-CM | POA: Diagnosis present

## 2018-08-21 DIAGNOSIS — I2582 Chronic total occlusion of coronary artery: Secondary | ICD-10-CM | POA: Diagnosis present

## 2018-08-21 DIAGNOSIS — R778 Other specified abnormalities of plasma proteins: Secondary | ICD-10-CM | POA: Diagnosis present

## 2018-08-21 DIAGNOSIS — I2510 Atherosclerotic heart disease of native coronary artery without angina pectoris: Secondary | ICD-10-CM

## 2018-08-21 DIAGNOSIS — E7800 Pure hypercholesterolemia, unspecified: Secondary | ICD-10-CM | POA: Diagnosis present

## 2018-08-21 DIAGNOSIS — I214 Non-ST elevation (NSTEMI) myocardial infarction: Principal | ICD-10-CM | POA: Diagnosis present

## 2018-08-21 DIAGNOSIS — E1140 Type 2 diabetes mellitus with diabetic neuropathy, unspecified: Secondary | ICD-10-CM | POA: Diagnosis present

## 2018-08-21 DIAGNOSIS — I959 Hypotension, unspecified: Secondary | ICD-10-CM | POA: Diagnosis present

## 2018-08-21 DIAGNOSIS — Z9111 Patient's noncompliance with dietary regimen: Secondary | ICD-10-CM

## 2018-08-21 DIAGNOSIS — R079 Chest pain, unspecified: Secondary | ICD-10-CM | POA: Diagnosis present

## 2018-08-21 DIAGNOSIS — I10 Essential (primary) hypertension: Secondary | ICD-10-CM | POA: Diagnosis present

## 2018-08-21 DIAGNOSIS — E1165 Type 2 diabetes mellitus with hyperglycemia: Secondary | ICD-10-CM | POA: Diagnosis not present

## 2018-08-21 DIAGNOSIS — E118 Type 2 diabetes mellitus with unspecified complications: Secondary | ICD-10-CM | POA: Diagnosis present

## 2018-08-21 DIAGNOSIS — I255 Ischemic cardiomyopathy: Secondary | ICD-10-CM | POA: Diagnosis present

## 2018-08-21 DIAGNOSIS — Z7982 Long term (current) use of aspirin: Secondary | ICD-10-CM

## 2018-08-21 DIAGNOSIS — G4733 Obstructive sleep apnea (adult) (pediatric): Secondary | ICD-10-CM | POA: Diagnosis present

## 2018-08-21 DIAGNOSIS — I5042 Chronic combined systolic (congestive) and diastolic (congestive) heart failure: Secondary | ICD-10-CM | POA: Diagnosis present

## 2018-08-21 DIAGNOSIS — Z79899 Other long term (current) drug therapy: Secondary | ICD-10-CM

## 2018-08-21 DIAGNOSIS — M1990 Unspecified osteoarthritis, unspecified site: Secondary | ICD-10-CM | POA: Diagnosis present

## 2018-08-21 DIAGNOSIS — E785 Hyperlipidemia, unspecified: Secondary | ICD-10-CM | POA: Diagnosis present

## 2018-08-21 DIAGNOSIS — Z955 Presence of coronary angioplasty implant and graft: Secondary | ICD-10-CM

## 2018-08-21 DIAGNOSIS — Z7901 Long term (current) use of anticoagulants: Secondary | ICD-10-CM

## 2018-08-21 MED ORDER — HYDROCODONE-ACETAMINOPHEN 5-325 MG PO TABS
1.0000 | ORAL_TABLET | Freq: Once | ORAL | Status: AC
  Administered 2018-08-21: 1 via ORAL
  Filled 2018-08-21: qty 1
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
## 2018-08-21 NOTE — ED Triage Notes (Signed)
Pt in by [facility] for chest pain x 4 days.  Pain is worse with palpation and deep breath.  Pt reports a cough and sore throat as well.   Pt was given 2 nitro and 4 baby aspirin prior to arrival by ems.  Pt is resident of [facility]

## 2018-08-22 ENCOUNTER — Encounter (HOSPITAL_COMMUNITY): Payer: Self-pay

## 2018-08-22 ENCOUNTER — Observation Stay (HOSPITAL_COMMUNITY): Payer: Medicaid Other

## 2018-08-22 ENCOUNTER — Other Ambulatory Visit: Payer: Self-pay

## 2018-08-22 DIAGNOSIS — R079 Chest pain, unspecified: Secondary | ICD-10-CM | POA: Diagnosis not present

## 2018-08-22 DIAGNOSIS — I208 Other forms of angina pectoris: Secondary | ICD-10-CM

## 2018-08-22 DIAGNOSIS — Z789 Other specified health status: Secondary | ICD-10-CM

## 2018-08-22 DIAGNOSIS — I5022 Chronic systolic (congestive) heart failure: Secondary | ICD-10-CM

## 2018-08-22 DIAGNOSIS — I340 Nonrheumatic mitral (valve) insufficiency: Secondary | ICD-10-CM

## 2018-08-22 DIAGNOSIS — I25118 Atherosclerotic heart disease of native coronary artery with other forms of angina pectoris: Secondary | ICD-10-CM

## 2018-08-22 DIAGNOSIS — R7989 Other specified abnormal findings of blood chemistry: Secondary | ICD-10-CM | POA: Diagnosis not present

## 2018-08-22 DIAGNOSIS — I519 Heart disease, unspecified: Secondary | ICD-10-CM

## 2018-08-22 DIAGNOSIS — I214 Non-ST elevation (NSTEMI) myocardial infarction: Principal | ICD-10-CM

## 2018-08-22 DIAGNOSIS — Z95810 Presence of automatic (implantable) cardiac defibrillator: Secondary | ICD-10-CM

## 2018-08-22 DIAGNOSIS — I959 Hypotension, unspecified: Secondary | ICD-10-CM

## 2018-08-22 DIAGNOSIS — I236 Thrombosis of atrium, auricular appendage, and ventricle as current complications following acute myocardial infarction: Secondary | ICD-10-CM

## 2018-08-22 DIAGNOSIS — E785 Hyperlipidemia, unspecified: Secondary | ICD-10-CM

## 2018-08-22 LAB — COMPREHENSIVE METABOLIC PANEL
ALBUMIN: 3.8 g/dL (ref 3.5–5.0)
ALK PHOS: 53 U/L (ref 38–126)
ALT: 18 U/L (ref 0–44)
AST: 30 U/L (ref 15–41)
Anion gap: 7 (ref 5–15)
BILIRUBIN TOTAL: 0.4 mg/dL (ref 0.3–1.2)
BUN: 14 mg/dL (ref 8–23)
CALCIUM: 9.2 mg/dL (ref 8.9–10.3)
CO2: 28 mmol/L (ref 22–32)
Chloride: 100 mmol/L (ref 98–111)
Creatinine, Ser: 1.12 mg/dL (ref 0.61–1.24)
GFR calc Af Amer: >60 mL/min (ref >60)
GLUCOSE: 306 mg/dL — AB (ref 70–99)
POTASSIUM: 3.9 mmol/L (ref 3.5–5.1)
Sodium: 135 mmol/L (ref 135–145)
TOTAL PROTEIN: 7 g/dL (ref 6.5–8.1)

## 2018-08-22 LAB — CBC
HCT: 40.3 % (ref 39.0–52.0)
HEMOGLOBIN: 12.6 g/dL — AB (ref 13.0–17.0)
MCH: 24.6 pg — AB (ref 26.0–34.0)
MCHC: 31.3 g/dL (ref 30.0–36.0)
MCV: 78.7 fL — ABNORMAL LOW (ref 80.0–100.0)
PLATELETS: 264 10*3/uL (ref 150–400)
RBC: 5.12 MIL/uL (ref 4.22–5.81)
RDW: 14.9 % (ref 11.5–15.5)
WBC: 8.8 10*3/uL (ref 4.0–10.5)
nRBC: 0 % (ref 0.0–0.2)

## 2018-08-22 LAB — CBC WITH DIFFERENTIAL/PLATELET
Abs Immature Granulocytes: 0.03 10*3/uL (ref 0.00–0.07)
Basophils Absolute: 0 10*3/uL (ref 0.0–0.1)
Basophils Relative: 0 %
EOS ABS: 0.1 10*3/uL (ref 0.0–0.5)
Eosinophils Relative: 1 %
HEMATOCRIT: 41.5 % (ref 39.0–52.0)
Hemoglobin: 13.1 g/dL (ref 13.0–17.0)
IMMATURE GRANULOCYTES: 0 %
LYMPHS ABS: 1.5 10*3/uL (ref 0.7–4.0)
LYMPHS PCT: 15 %
MCH: 25 pg — ABNORMAL LOW (ref 26.0–34.0)
MCHC: 31.6 g/dL (ref 30.0–36.0)
MCV: 79 fL — AB (ref 80.0–100.0)
MONOS PCT: 4 %
Monocytes Absolute: 0.4 10*3/uL (ref 0.1–1.0)
NEUTROS PCT: 80 %
Neutro Abs: 8.1 10*3/uL — ABNORMAL HIGH (ref 1.7–7.7)
Platelets: 255 10*3/uL (ref 150–400)
RBC: 5.25 MIL/uL (ref 4.22–5.81)
RDW: 14.8 % (ref 11.5–15.5)
WBC: 10.1 10*3/uL (ref 4.0–10.5)
nRBC: 0 % (ref 0.0–0.2)

## 2018-08-22 LAB — BASIC METABOLIC PANEL
ANION GAP: 10 (ref 5–15)
BUN: 15 mg/dL (ref 8–23)
CALCIUM: 9.3 mg/dL (ref 8.9–10.3)
CO2: 26 mmol/L (ref 22–32)
CREATININE: 1.29 mg/dL — AB (ref 0.61–1.24)
Chloride: 96 mmol/L — ABNORMAL LOW (ref 98–111)
GFR calc Af Amer: >60 mL/min (ref >60)
GFR calc non Af Amer: 58 mL/min — ABNORMAL LOW (ref >60)
GLUCOSE: 359 mg/dL — AB (ref 70–99)
Potassium: 3.8 mmol/L (ref 3.5–5.1)
Sodium: 132 mmol/L — ABNORMAL LOW (ref 135–145)

## 2018-08-22 LAB — GLUCOSE, CAPILLARY
GLUCOSE-CAPILLARY: 440 mg/dL — AB (ref 70–99)
Glucose-Capillary: 280 mg/dL — ABNORMAL HIGH (ref 70–99)
Glucose-Capillary: 340 mg/dL — ABNORMAL HIGH (ref 70–99)
Glucose-Capillary: 290 mg/dL — ABNORMAL HIGH (ref 70–99)

## 2018-08-22 LAB — PROTIME-INR
INR: 3.15
Prothrombin Time: 31.9 seconds — ABNORMAL HIGH (ref 11.4–15.2)

## 2018-08-22 LAB — TROPONIN I
TROPONIN I: 2.32 ng/mL — AB (ref <0.03)
Troponin I: 0.16 ng/mL (ref <0.03)
Troponin I: 1.22 ng/mL (ref <0.03)
Troponin I: 2.05 ng/mL (ref <0.03)

## 2018-08-22 LAB — MRSA PCR SCREENING: MRSA by PCR: NEGATIVE

## 2018-08-22 LAB — CBG MONITORING, ED: Glucose-Capillary: 319 mg/dL — ABNORMAL HIGH (ref 70–99)

## 2018-08-22 LAB — DIGOXIN LEVEL: DIGOXIN LVL: 0.3 ng/mL — AB (ref 0.8–2.0)

## 2018-08-22 MED ORDER — GABAPENTIN 100 MG PO CAPS
100.0000 mg | ORAL_CAPSULE | Freq: Two times a day (BID) | ORAL | Status: DC
  Administered 2018-08-22 – 2018-08-26 (×9): 100 mg via ORAL
  Filled 2018-08-22 (×9): qty 1

## 2018-08-22 MED ORDER — POTASSIUM CHLORIDE CRYS ER 20 MEQ PO TBCR
20.0000 meq | EXTENDED_RELEASE_TABLET | Freq: Every day | ORAL | Status: DC
  Administered 2018-08-22 – 2018-08-26 (×5): 20 meq via ORAL
  Filled 2018-08-22 (×5): qty 1

## 2018-08-22 MED ORDER — MORPHINE SULFATE (PF) 2 MG/ML IV SOLN
0.5000 mg | INTRAVENOUS | Status: DC | PRN
  Administered 2018-08-22: 0.5 mg via INTRAVENOUS
  Administered 2018-08-22 – 2018-08-24 (×2): 1 mg via INTRAVENOUS
  Filled 2018-08-22 (×3): qty 1

## 2018-08-22 MED ORDER — SODIUM CHLORIDE 0.9% FLUSH: 3.0000 mL | INTRAVENOUS | Status: DC | PRN

## 2018-08-22 MED ORDER — NICOTINE 21 MG/24HR TD PT24
21.0000 mg | MEDICATED_PATCH | Freq: Every day | TRANSDERMAL | Status: DC
  Filled 2018-08-22 (×2): qty 1

## 2018-08-22 MED ORDER — LOSARTAN POTASSIUM 25 MG PO TABS
12.5000 mg | ORAL_TABLET | Freq: Every day | ORAL | Status: DC
  Administered 2018-08-22 – 2018-08-26 (×3): 12.5 mg via ORAL
  Filled 2018-08-22 (×3): qty 1
  Filled 2018-08-22: qty 0.5
  Filled 2018-08-22: qty 1

## 2018-08-22 MED ORDER — PANTOPRAZOLE SODIUM 40 MG PO TBEC
40.0000 mg | DELAYED_RELEASE_TABLET | Freq: Two times a day (BID) | ORAL | Status: DC
  Administered 2018-08-22 – 2018-08-26 (×9): 40 mg via ORAL
  Filled 2018-08-22 (×9): qty 1

## 2018-08-22 MED ORDER — ACETAMINOPHEN 650 MG RE SUPP: 650.0000 mg | Freq: Four times a day (QID) | RECTAL | Status: DC | PRN

## 2018-08-22 MED ORDER — EZETIMIBE 10 MG PO TABS
10.0000 mg | ORAL_TABLET | Freq: Every day | ORAL | Status: DC
  Administered 2018-08-22 – 2018-08-26 (×4): 10 mg via ORAL
  Filled 2018-08-22 (×5): qty 1

## 2018-08-22 MED ORDER — ACETAMINOPHEN 325 MG PO TABS
650.0000 mg | ORAL_TABLET | Freq: Four times a day (QID) | ORAL | Status: DC | PRN
  Administered 2018-08-23 – 2018-08-25 (×3): 650 mg via ORAL
  Filled 2018-08-22 (×3): qty 2

## 2018-08-22 MED ORDER — ASPIRIN EC 81 MG PO TBEC
81.0000 mg | DELAYED_RELEASE_TABLET | Freq: Every day | ORAL | Status: DC
  Administered 2018-08-22 – 2018-08-26 (×4): 81 mg via ORAL
  Filled 2018-08-22 (×4): qty 1

## 2018-08-22 MED ORDER — ATORVASTATIN CALCIUM 40 MG PO TABS: 80.0000 mg | ORAL_TABLET | Freq: Every day | ORAL | Status: DC

## 2018-08-22 MED ORDER — SPIRONOLACTONE 25 MG PO TABS
25.0000 mg | ORAL_TABLET | Freq: Every day | ORAL | Status: DC
  Administered 2018-08-22 – 2018-08-25 (×4): 25 mg via ORAL
  Filled 2018-08-22 (×4): qty 1

## 2018-08-22 MED ORDER — FENOFIBRATE 160 MG PO TABS
160.0000 mg | ORAL_TABLET | Freq: Every day | ORAL | Status: DC
  Administered 2018-08-22 – 2018-08-26 (×5): 160 mg via ORAL
  Filled 2018-08-22 (×8): qty 1

## 2018-08-22 MED ORDER — CARVEDILOL 3.125 MG PO TABS
6.2500 mg | ORAL_TABLET | Freq: Two times a day (BID) | ORAL | Status: DC
  Administered 2018-08-22 – 2018-08-26 (×10): 6.25 mg via ORAL
  Filled 2018-08-22 (×3): qty 1
  Filled 2018-08-22: qty 2
  Filled 2018-08-22 (×2): qty 1
  Filled 2018-08-22: qty 2
  Filled 2018-08-22: qty 1
  Filled 2018-08-22: qty 2
  Filled 2018-08-22: qty 1

## 2018-08-22 MED ORDER — INSULIN ASPART 100 UNIT/ML ~~LOC~~ SOLN
11.0000 [IU] | Freq: Once | SUBCUTANEOUS | Status: AC
  Administered 2018-08-22: 11 [IU] via SUBCUTANEOUS

## 2018-08-22 MED ORDER — INSULIN ASPART 100 UNIT/ML ~~LOC~~ SOLN
0.0000 [IU] | SUBCUTANEOUS | Status: DC
  Administered 2018-08-22: 7 [IU] via SUBCUTANEOUS
  Administered 2018-08-22: 5 [IU] via SUBCUTANEOUS
  Administered 2018-08-22: 7 [IU] via SUBCUTANEOUS
  Administered 2018-08-22 – 2018-08-23 (×2): 5 [IU] via SUBCUTANEOUS
  Administered 2018-08-23: 7 [IU] via SUBCUTANEOUS
  Administered 2018-08-23: 2 [IU] via SUBCUTANEOUS
  Administered 2018-08-23: 5 [IU] via SUBCUTANEOUS
  Administered 2018-08-23: 1 [IU] via SUBCUTANEOUS
  Administered 2018-08-23: 7 [IU] via SUBCUTANEOUS
  Administered 2018-08-24: 2 [IU] via SUBCUTANEOUS
  Administered 2018-08-24: 9 [IU] via SUBCUTANEOUS
  Administered 2018-08-24: 2 [IU] via SUBCUTANEOUS
  Administered 2018-08-24 (×2): 5 [IU] via SUBCUTANEOUS
  Administered 2018-08-25 (×2): 1 [IU] via SUBCUTANEOUS
  Administered 2018-08-25 (×2): 2 [IU] via SUBCUTANEOUS
  Administered 2018-08-25: 1 [IU] via SUBCUTANEOUS

## 2018-08-22 MED ORDER — SODIUM CHLORIDE 0.9% FLUSH
3.0000 mL | Freq: Two times a day (BID) | INTRAVENOUS | Status: DC
  Administered 2018-08-22 – 2018-08-26 (×8): 3 mL via INTRAVENOUS

## 2018-08-22 MED ORDER — MECLIZINE HCL 12.5 MG PO TABS
12.5000 mg | ORAL_TABLET | Freq: Three times a day (TID) | ORAL | Status: DC | PRN
  Filled 2018-08-22: qty 1

## 2018-08-22 MED ORDER — TORSEMIDE 20 MG PO TABS
20.0000 mg | ORAL_TABLET | Freq: Every day | ORAL | Status: DC
  Administered 2018-08-22 – 2018-08-26 (×4): 20 mg via ORAL
  Filled 2018-08-22 (×4): qty 1

## 2018-08-22 MED ORDER — SODIUM CHLORIDE 0.9 % IV SOLN: 250.0000 mL | INTRAVENOUS | Status: DC | PRN

## 2018-08-22 MED ORDER — DIGOXIN 125 MCG PO TABS
0.1250 mg | ORAL_TABLET | Freq: Every day | ORAL | Status: DC
  Administered 2018-08-22 – 2018-08-26 (×5): 0.125 mg via ORAL
  Filled 2018-08-22 (×5): qty 1

## 2018-08-22 MED ORDER — ALUM & MAG HYDROXIDE-SIMETH 200-200-20 MG/5ML PO SUSP
30.0000 mL | Freq: Once | ORAL | Status: AC
  Administered 2018-08-22: 30 mL via ORAL
  Filled 2018-08-22: qty 30

## 2018-08-22 NOTE — ED Notes (Addendum)
Date and time results received: 08/22/18 1:06 AM  (use smartphrase ".now" to insert current time)  Test: trop Critical Value: 0.16  Name of Provider Notified: wickline  Orders Received? Or Actions Taken?: none at this time

## 2018-08-22 NOTE — ED Notes (Signed)
Date and time results received: 08/22/18 0255 (use smartphrase ".now" to insert current time)  Test: Troponin Critical Value: 1.22  Name of Provider Notified: Dr Christy Gentles  Orders Received? Or Actions Taken?:
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
## 2018-08-22 NOTE — Progress Notes (Signed)
08/21/2018 11:26 PM  08/22/2018 6:21 AM  Viann Shove Lacroix was seen and examined.  The H&P by the admitting provider, orders, imaging was reviewed.  Please see new orders.  Will continue to follow.  Await for cardiology team recommendations.   Vitals:   08/22/18 0537 08/22/18 0600  BP:  112/75  Pulse:  67  Resp:  13  Temp: 97.7 F (36.5 C)   SpO2:  96%   Results for orders placed or performed during the hospital encounter of 64/33/29  Basic metabolic panel  Result Value Ref Range   Sodium 132 (L) 135 - 145 mmol/L   Potassium 3.8 3.5 - 5.1 mmol/L   Chloride 96 (L) 98 - 111 mmol/L   CO2 26 22 - 32 mmol/L   Glucose, Bld 359 (H) 70 - 99 mg/dL   BUN 15 8 - 23 mg/dL   Creatinine, Ser 1.29 (H) 0.61 - 1.24 mg/dL   Calcium 9.3 8.9 - 10.3 mg/dL   GFR calc non Af Amer 58 (L) >60 mL/min   GFR calc Af Amer >60 >60 mL/min   Anion gap 10 5 - 15  CBC with Differential/Platelet  Result Value Ref Range   WBC 10.1 4.0 - 10.5 K/uL   RBC 5.25 4.22 - 5.81 MIL/uL   Hemoglobin 13.1 13.0 - 17.0 g/dL   HCT 41.5 39.0 - 52.0 %   MCV 79.0 (L) 80.0 - 100.0 fL   MCH 25.0 (L) 26.0 - 34.0 pg   MCHC 31.6 30.0 - 36.0 g/dL   RDW 14.8 11.5 - 15.5 %   Platelets 255 150 - 400 K/uL   nRBC 0.0 0.0 - 0.2 %   Neutrophils Relative % 80 %   Neutro Abs 8.1 (H) 1.7 - 7.7 K/uL   Lymphocytes Relative 15 %   Lymphs Abs 1.5 0.7 - 4.0 K/uL   Monocytes Relative 4 %   Monocytes Absolute 0.4 0.1 - 1.0 K/uL   Eosinophils Relative 1 %   Eosinophils Absolute 0.1 0.0 - 0.5 K/uL   Basophils Relative 0 %   Basophils Absolute 0.0 0.0 - 0.1 K/uL   Immature Granulocytes 0 %   Abs Immature Granulocytes 0.03 0.00 - 0.07 K/uL  Troponin I  Result Value Ref Range   Troponin I 0.16 (HH) <0.03 ng/mL  Protime-INR  Result Value Ref Range   Prothrombin Time 31.9 (H) 11.4 - 15.2 seconds   INR 3.15   Digoxin level  Result Value Ref Range   Digoxin Level 0.3 (L) 0.8 - 2.0 ng/mL  Troponin I  Result Value Ref Range   Troponin I  1.22 (HH) <0.03 ng/mL  CBG monitoring, ED  Result Value Ref Range   Glucose-Capillary 319 (H) 70 - 99 mg/dL     Murvin Natal, MD [facility]

## 2018-08-22 NOTE — Clinical Social Work Note (Signed)
Clinical Social Work Assessment  Patient Details  Name: Daniel Li Pressly MRN: [id number] Date of Birth: 1956-03-19  Date of referral:  08/22/18               Reason for consult:  Discharge Planning                Permission sought to share information with:    Permission granted to share information::     Name::        Agency::     Relationship::     Contact Information:     Housing/Transportation Living arrangements for the past 2 months:  Jud of Information:  Patient, Facility Patient Interpreter Needed:  None Criminal Activity/Legal Involvement Pertinent to Current Situation/Hospitalization:  No - Comment as needed Significant Relationships:  Siblings Lives with:  Facility Resident Do you feel safe going back to the place where you live?  Yes Need for family participation in patient care:  No (Coment)  Care giving concerns: Pt resides in an ALF.   Social Worker assessment / plan: Pt is a 62 year old male admitted from [facility]. Pt reviewed in Progression this AM. It appears Cardiology is requesting to have pt transferred to [facility]. Pt aware and in agreement. Spoke with staff at the ALF this AM. Pt is mostly independent in ADL's. They provide his medications and monitor his CHF. Facility will transport pt home at dc. CSW at [facility] will follow up for dc planning needs.  Employment status:  Disabled (Comment on whether or not currently receiving Disability) Insurance information:  Medicaid [place] PT Recommendations:  Not assessed at this time Information / Referral to community resources:     Patient/Family's Response to care: Pt accepting of care.   Patient/Family's Understanding of and Emotional Response to Diagnosis, Current Treatment, and Prognosis: Pt appears to understand diagnosis and treatment recommendations. No emotional distress identified.  Emotional Assessment Appearance:  Appears stated age Attitude/Demeanor/Rapport:   Engaged Affect (typically observed):  Calm, Pleasant Orientation:  Oriented to Self, Oriented to Place, Oriented to  Time, Oriented to Situation Alcohol / Substance use:  Not Applicable Psych involvement (Current and /or in the community):  No (Comment)  Discharge Needs  Concerns to be addressed:  Discharge Planning Concerns Readmission within the last 30 days:  No Current discharge risk:  None Barriers to Discharge:  No Barriers Identified   Shade Flood, LCSW 08/22/2018, 12:08 PM

## 2018-08-22 NOTE — Progress Notes (Signed)
Pt transferred to [facility] for possible cardiac cath. [facility] given report and transferred via stretcher. Report called to [place] at [facility].

## 2018-08-22 NOTE — ED Provider Notes (Signed)
Sleeping on reassessment.  When he wakes up he has multiple complaints including headache, sore throat, chest pain.  His chest pain is clearly reproducible with palpation He is awaiting admission   Ripley Fraise, MD 08/22/18 [phone]

## 2018-08-22 NOTE — Progress Notes (Signed)
CBG 440 Paged [facility] on-call [place]

## 2018-08-22 NOTE — Progress Notes (Signed)
ANTICOAGULATION CONSULT NOTE - Initial Consult  Pharmacy Consult for heparin Indication: ACS/STEMI  Allergies  Allergen Reactions  . Pork-Derived Products Other (See Comments)    Does not eat for religious reasons - okay with using IV heparin    Patient Measurements: Height: 5\' 6"  (167.6 cm) Weight: 179 lb 0.2 oz (81.2 kg) IBW/kg (Calculated) : 63.8 Heparin Dosing Weight: 80 kg  Vital Signs: Temp: 97.7 F (36.5 C) (10/25 0733) Temp Source: Oral (10/25 0733) BP: 112/75 (10/25 0600) Pulse Rate: 67 (10/25 0600)  Labs: Recent Labs    08/22/18 0018 08/22/18 0255 08/22/18 0610  HGB 13.1  --  12.6*  HCT 41.5  --  40.3  PLT 255  --  264  LABPROT 31.9*  --   --   INR 3.15  --   --   CREATININE 1.29*  --  1.12  TROPONINI 0.16* 1.22* 2.32*    Estimated Creatinine Clearance: 68.5 mL/min (by C-G formula based on SCr of 1.12 mg/dL).   Medical History: Past Medical History:  Diagnosis Date  . AICD (automatic cardioverter/defibrillator) present   . Anxiety   . Arthritis    "all over" (09/17/2017)  . Burn   . CAD (coronary artery disease)    a. NSTEMI s/p BMS to 1st Diagonal and distal OM2 in 2007; b. STEMI 03/26/12 s/p BMS to RCA; c. NSTEMI 10/2012 : CTO of LCx (unable to open with PCI) and PL branch, mod dz of LAD and diagonal, and preserved LV systolic fxn, Med Rx;  d.  anterior STEMI in 01/2017 with DES to Proximal LAD  . Cardiomyopathy EF 35% on cath 06/30/14, new from jan 2015 07/20/2014  . Chest pain   . CHF (congestive heart failure) ([place])   . Chronic back pain    "all over" (09/17/2017)  . DDD (degenerative disc disease), cervical   . Depression   . GERD (gastroesophageal reflux disease)   . Headache    "a few/wk" (09/17/2017)  . HTN (hypertension)   . Hypercholesterolemia   . Mental disorder   . Myocardial infarction ([place])    "I've had 7" (09/17/2017)  . Pulmonary edema   . Respiratory failure ([place])   . Sciatic pain   . Sleep apnea   . Stroke [facility])    a.  multiple dating back to 2002; *I''ve had 5; LUE/LLE weaker since" (09/17/2017)  . Tibia fracture (l) leg  . Tobacco abuse   . Type 2 diabetes mellitus ([place])   . Unstable angina (HCC)     Medications:  Medications Prior to Admission  Medication Sig Dispense Refill Last Dose  . acetaminophen (TYLENOL) 325 MG tablet Take 2 tablets (650 mg total) by mouth every 6 (six) hours as needed for mild pain, fever or headache. 40 tablet 0 Taking  . carvedilol (COREG) 6.25 MG tablet Take 1 tablet (6.25 mg total) by mouth 2 (two) times daily with a meal. 60 tablet 6 Taking  . digoxin (LANOXIN) 0.125 MG tablet Take 1 tablet (0.125 mg total) by mouth daily. 30 tablet 3 Taking  . Evolocumab (REPATHA SURECLICK) 263 MG/ML SOAJ Inject 1 pen into the skin every 14 (fourteen) days. 2 pen 3   . ezetimibe (ZETIA) 10 MG tablet Take 1 tablet (10 mg total) by mouth daily. 30 tablet 3 Taking  . fenofibrate (TRICOR) 145 MG tablet Take 1 tablet (145 mg total) by mouth daily. 30 tablet 6 Taking  . gabapentin (NEURONTIN) 100 MG capsule Take 1 capsule (100 mg total) by  mouth 2 (two) times daily. 60 capsule 0 Taking  . glipiZIDE (GLUCOTROL XL) 10 MG 24 hr tablet Take 1 tablet (10 mg total) by mouth daily. 30 tablet 1 Taking  . Icosapent Ethyl (VASCEPA) 1 g CAPS Take 2 capsules (2 g total) by mouth 2 (two) times daily. (Patient not taking: Reported on 07/30/2018) 120 capsule 3 Not Taking  . losartan (COZAAR) 25 MG tablet Take 0.5 tablets (12.5 mg total) by mouth daily. 15 tablet 3 Taking  . meclizine (ANTIVERT) 12.5 MG tablet Take 1 tablet (12.5 mg total) by mouth 3 (three) times daily as needed for dizziness. 30 tablet 0 Taking  . nicotine (NICODERM CQ - DOSED IN MG/24 HOURS) 21 mg/24hr patch Place 1 patch (21 mg total) onto the skin daily. 28 patch 0 Taking  . nitroGLYCERIN (NITROSTAT) 0.4 MG SL tablet PLACE ONE (1) TABLET UNDER TONGUE EVERY 5 MINUTES UP TO (3) DOSES AS NEEDED FOR CHEST PAIN. IF NO RELIEF, CONTACT MD. (Patient  not taking: No sig reported) 25 tablet 3 Not Taking  . pantoprazole (PROTONIX) 40 MG tablet Take 1 tablet (40 mg total) by mouth 2 (two) times daily. 60 tablet 1 Taking  . potassium chloride SA (K-DUR,KLOR-CON) 20 MEQ tablet Take 1 tablet (20 mEq total) by mouth daily. 30 tablet 3 Taking  . spironolactone (ALDACTONE) 25 MG tablet Take 1 tablet (25 mg total) by mouth at bedtime. 30 tablet 3   . torsemide (DEMADEX) 20 MG tablet Take 1 tablet (20 mg total) by mouth daily. 30 tablet 2 Taking  . warfarin (COUMADIN) 5 MG tablet Hold coumadin tonight then decrease dose to 5mg  daily 45 tablet 11     Assessment: Pharmacy consulted to dose heparin for ACS/STEMI. Patient's troponin on admission is 2.32 and INR 3.15.  Will monitor daily INR and initiate heparin once INR is below 2.  Goal of Therapy:  Heparin level 0.3-0.7 units/ml Monitor platelets by anticoagulation protocol: Yes   Plan:  Hold warfarin due to possible Cath. Initiate heparin once INR drops below 2. Monitor labs and s/s of bleeding  Ramond Craver 08/22/2018,9:55 AM

## 2018-08-23 ENCOUNTER — Other Ambulatory Visit: Payer: Self-pay

## 2018-08-23 DIAGNOSIS — I208 Other forms of angina pectoris: Secondary | ICD-10-CM | POA: Diagnosis not present

## 2018-08-23 DIAGNOSIS — I214 Non-ST elevation (NSTEMI) myocardial infarction: Secondary | ICD-10-CM | POA: Diagnosis not present

## 2018-08-23 LAB — PROTIME-INR
INR: 2.46
PROTHROMBIN TIME: 26.3 s — AB (ref 11.4–15.2)

## 2018-08-23 LAB — GLUCOSE, CAPILLARY
GLUCOSE-CAPILLARY: 171 mg/dL — AB (ref 70–99)
GLUCOSE-CAPILLARY: 307 mg/dL — AB (ref 70–99)
GLUCOSE-CAPILLARY: 313 mg/dL — AB (ref 70–99)
Glucose-Capillary: 124 mg/dL — ABNORMAL HIGH (ref 70–99)
Glucose-Capillary: 277 mg/dL — ABNORMAL HIGH (ref 70–99)
Glucose-Capillary: 294 mg/dL — ABNORMAL HIGH (ref 70–99)

## 2018-08-23 MED ORDER — NITROGLYCERIN 0.4 MG SL SUBL
0.4000 mg | SUBLINGUAL_TABLET | SUBLINGUAL | Status: DC | PRN
  Administered 2018-08-23 – 2018-08-24 (×4): 0.4 mg via SUBLINGUAL
  Filled 2018-08-23: qty 1

## 2018-08-23 NOTE — Progress Notes (Signed)
[place] for warfarin > heparin Indication: ACS/STEMI, LV thrombus  Allergies  Allergen Reactions  . Pork-Derived Products Other (See Comments)    Does not eat for religious reasons - okay with using IV heparin    Patient Measurements: Height: 5' 6.5" (168.9 cm) Weight: 176 lb 11.2 oz (80.2 kg) IBW/kg (Calculated) : 64.95 Heparin Dosing Weight: 80 kg  Vital Signs: Temp: 98 F (36.7 C) (10/26 0811) Temp Source: Oral (10/26 0811) BP: 91/68 (10/26 0811) Pulse Rate: 77 (10/26 0811)  Labs: Recent Labs    08/22/18 0018 08/22/18 0255 08/22/18 0610 08/22/18 1227 08/23/18 0501  HGB 13.1  --  12.6*  --   --   HCT 41.5  --  40.3  --   --   PLT 255  --  264  --   --   LABPROT 31.9*  --   --   --  26.3*  INR 3.15  --   --   --  2.46  CREATININE 1.29*  --  1.12  --   --   TROPONINI 0.16* 1.22* 2.32* 2.05*  --     Estimated Creatinine Clearance: 68.8 mL/min (by C-G formula based on SCr of 1.12 mg/dL).   Medical History: Past Medical History:  Diagnosis Date  . AICD (automatic cardioverter/defibrillator) present   . Anxiety   . Arthritis    "all over" (09/17/2017)  . Burn   . CAD (coronary artery disease)    a. NSTEMI s/p BMS to 1st Diagonal and distal OM2 in 2007; b. STEMI 03/26/12 s/p BMS to RCA; c. NSTEMI 10/2012 : CTO of LCx (unable to open with PCI) and PL branch, mod dz of LAD and diagonal, and preserved LV systolic fxn, Med Rx;  d.  anterior STEMI in 01/2017 with DES to Proximal LAD  . Cardiomyopathy EF 35% on cath 06/30/14, new from jan 2015 07/20/2014  . Chest pain   . CHF (congestive heart failure) ([place])   . Chronic back pain    "all over" (09/17/2017)  . DDD (degenerative disc disease), cervical   . Depression   . GERD (gastroesophageal reflux disease)   . Headache    "a few/wk" (09/17/2017)  . HTN (hypertension)   . Hypercholesterolemia   . Mental disorder   . Myocardial infarction ([place])    "I've had 7" (09/17/2017)  .  Pulmonary edema   . Respiratory failure ([place])   . Sciatic pain   . Sleep apnea   . Stroke [facility])    a. multiple dating back to 2002; *I''ve had 5; LUE/LLE weaker since" (09/17/2017)  . Tibia fracture (l) leg  . Tobacco abuse   . Type 2 diabetes mellitus ([place])   . Unstable angina (HCC)     Medications:  Medications Prior to Admission  Medication Sig Dispense Refill Last Dose  . acetaminophen (TYLENOL) 325 MG tablet Take 2 tablets (650 mg total) by mouth every 6 (six) hours as needed for mild pain, fever or headache. 40 tablet 0 unk  . digoxin (LANOXIN) 0.125 MG tablet Take 1 tablet (0.125 mg total) by mouth daily. 30 tablet 3 08/21/2018 at Unknown time  . Evolocumab (REPATHA SURECLICK) 751 MG/ML SOAJ Inject 1 pen into the skin every 14 (fourteen) days. 2 pen 3 08/13/2018  . ezetimibe (ZETIA) 10 MG tablet Take 1 tablet (10 mg total) by mouth daily. 30 tablet 3 08/21/2018 at Unknown time  . fenofibrate (TRICOR) 145 MG tablet Take 1 tablet (145 mg total)  by mouth daily. 30 tablet 6 08/21/2018 at Unknown time  . glipiZIDE (GLUCOTROL XL) 10 MG 24 hr tablet Take 1 tablet (10 mg total) by mouth daily. 30 tablet 1 08/21/2018 at Unknown time  . losartan (COZAAR) 25 MG tablet Take 0.5 tablets (12.5 mg total) by mouth daily. 15 tablet 3 08/21/2018 at Unknown time  . meclizine (ANTIVERT) 12.5 MG tablet Take 1 tablet (12.5 mg total) by mouth 3 (three) times daily as needed for dizziness. 30 tablet 0 08/21/2018 at Unknown time  . nicotine (NICODERM CQ - DOSED IN MG/24 HOURS) 21 mg/24hr patch Place 1 patch (21 mg total) onto the skin daily. 28 patch 0 08/21/2018 at Unknown time  . nitroGLYCERIN (NITROSTAT) 0.4 MG SL tablet PLACE ONE (1) TABLET UNDER TONGUE EVERY 5 MINUTES UP TO (3) DOSES AS NEEDED FOR CHEST PAIN. IF NO RELIEF, CONTACT MD. (Patient taking differently: Place 0.4 mg under the tongue every 5 (five) minutes as needed for chest pain. ) 25 tablet 3 08/21/2018 at Unknown time  . potassium chloride  SA (K-DUR,KLOR-CON) 20 MEQ tablet Take 1 tablet (20 mEq total) by mouth daily. 30 tablet 3 08/21/2018 at Unknown time  . torsemide (DEMADEX) 20 MG tablet Take 1 tablet (20 mg total) by mouth daily. 30 tablet 2 08/20/2018  . torsemide (DEMADEX) 20 MG tablet Take 20 mg by mouth daily as needed (weight gain greater than 3lbs overnight or greater than 5lbs in one week).   07/30/2018  . carvedilol (COREG) 6.25 MG tablet Take 1 tablet (6.25 mg total) by mouth 2 (two) times daily with a meal. (Patient not taking: Reported on 08/22/2018) 60 tablet 6 Not Taking at Unknown time  . gabapentin (NEURONTIN) 100 MG capsule Take 1 capsule (100 mg total) by mouth 2 (two) times daily. (Patient not taking: Reported on 08/22/2018) 60 capsule 0 Not Taking at Unknown time  . Icosapent Ethyl (VASCEPA) 1 g CAPS Take 2 capsules (2 g total) by mouth 2 (two) times daily. (Patient not taking: Reported on 07/30/2018) 120 capsule 3 Not Taking at Unknown time  . pantoprazole (PROTONIX) 40 MG tablet Take 1 tablet (40 mg total) by mouth 2 (two) times daily. (Patient not taking: Reported on 08/22/2018) 60 tablet 1 Not Taking at Unknown time  . spironolactone (ALDACTONE) 25 MG tablet Take 1 tablet (25 mg total) by mouth at bedtime. (Patient not taking: Reported on 08/22/2018) 30 tablet 3 Not Taking at Unknown time  . warfarin (COUMADIN) 5 MG tablet Hold coumadin tonight then decrease dose to 5mg  daily (Patient not taking: Reported on 08/22/2018) 45 tablet 11 Not Taking at Unknown time    Assessment: 69 yom on warfarin PTA for hx LV thrombus presenting with CP, elevated troponin. Pharmacy consulted to dose heparin for ACS when INR<2. Plan is for cath when INR<1.8. INR down to 2.46 today. Hg 12.6, plt wnl on admit. No bleed issues documented.  Goal of Therapy:  Heparin level 0.3-0.7 units/ml Monitor platelets by anticoagulation protocol: Yes   Plan:  Hold warfarin in anticipation of cath Heparin when INR<2 Monitor daily INR, CBC,  s/sx bleeding  Elicia Lamp, PharmD, BCPS Clinical Pharmacist Clinical phone [phone] Please check AMION for all [place] contact numbers 08/23/2018 10:54 AM

## 2018-08-23 NOTE — Progress Notes (Addendum)
Progress Note  Patient Name: Daniel Li Date of Encounter: 08/23/2018  Primary Cardiologist: Carlyle Dolly, MD  / Aundra Dubin / [place]   Subjective   Daniel Li is a 62 year old gentleman with a history of coronary artery disease-Status post BMS to D1 and OM 2 in 2007, status post BMS to RCA in May, 2013, CTO of the left circumflex artery in 2014.  Most recently was status post an anterior ST segment elevation myocardial infarction in April, 2018 with placement  of a DES to the proximal LAD.  Has a history of left ventricular  Thrombus and has been on Coumadin.  We are asked to see him yesterday by Dr. Maudie Mercury for further evaluation of an NSTEMI. Peak troponin was 2.32 on October 25 at 6:10  AM. His INR is currently 2.46.  The plan is to proceed with heart catheterization once his INR is less than 1.8.   Inpatient Medications    Scheduled Meds: . aspirin EC  81 mg Oral Daily  . carvedilol  6.25 mg Oral BID WC  . digoxin  0.125 mg Oral Daily  . ezetimibe  10 mg Oral Daily  . fenofibrate  160 mg Oral Daily  . gabapentin  100 mg Oral BID  . insulin aspart  0-9 Units Subcutaneous Q4H  . losartan  12.5 mg Oral Daily  . nicotine  21 mg Transdermal Daily  . pantoprazole  40 mg Oral BID  . potassium chloride SA  20 mEq Oral Daily  . sodium chloride flush  3 mL Intravenous Q12H  . spironolactone  25 mg Oral QHS  . torsemide  20 mg Oral Daily   Continuous Infusions: . sodium chloride     PRN Meds: sodium chloride, acetaminophen **OR** acetaminophen, meclizine, morphine injection, sodium chloride flush   Vital Signs    Vitals:   08/22/18 2046 08/23/18 0016 08/23/18 0410 08/23/18 0412  BP: 93/70 (!) 89/63 92/71   Pulse: 69 65 66   Resp: 20 20 18    Temp: 98.7 F (37.1 C) 98.4 F (36.9 C) 98.4 F (36.9 C)   TempSrc: Oral Oral Oral   SpO2: 98% 96% 97%   Weight:    80.2 kg  Height:        Intake/Output Summary (Last 24 hours) at 08/23/2018 0744 Last data filed at  08/23/2018 0300 Gross per 24 hour  Intake 240 ml  Output -  Net 240 ml   Filed Weights   08/22/18 0537 08/22/18 1518 08/23/18 0412  Weight: 81.2 kg 80 kg 80.2 kg    Telemetry     NSR  - Personally Reviewed  ECG     NSR  - Personally Reviewed  Physical Exam    GEN:  Middle-aged gentleman, no acute distress.  I woke him up this morning and then he said he was in 6/10 chest pain woke him up.  He did not appear to be in acute distress. Neck: No JVD Cardiac: RRR, no murmurs, rubs, or gallops.  Respiratory: Clear to auscultation bilaterally. GI: Soft, nontender, non-distended  MS: No edema; No deformity. Neuro:  Nonfocal  Psych: Normal affect   Labs    Chemistry Recent Labs  Lab 08/22/18 0018 08/22/18 0610  NA 132* 135  K 3.8 3.9  CL 96* 100  CO2 26 28  GLUCOSE 359* 306*  BUN 15 14  CREATININE 1.29* 1.12  CALCIUM 9.3 9.2  PROT  --  7.0  ALBUMIN  --  3.8  AST  --  30  ALT  --  18  ALKPHOS  --  53  BILITOT  --  0.4  GFRNONAA 58* >60  GFRAA >60 >60  ANIONGAP 10 7     Hematology Recent Labs  Lab 08/22/18 0018 08/22/18 0610  WBC 10.1 8.8  RBC 5.25 5.12  HGB 13.1 12.6*  HCT 41.5 40.3  MCV 79.0* 78.7*  MCH 25.0* 24.6*  MCHC 31.6 31.3  RDW 14.8 14.9  PLT 255 264    Cardiac Enzymes Recent Labs  Lab 08/22/18 0018 08/22/18 0255 08/22/18 0610 08/22/18 1227  TROPONINI 0.16* 1.22* 2.32* 2.05*   No results for input(s): TROPIPOC in the last 168 hours.   BNPNo results for input(s): BNP, PROBNP in the last 168 hours.   DDimer No results for input(s): DDIMER in the last 168 hours.   Radiology    Dg Chest 2 View  Result Date: 08/22/2018 CLINICAL DATA:  Cough, chest pain EXAM: CHEST - 2 VIEW COMPARISON:  06/16/2018 FINDINGS: Left AICD remains in place, unchanged. Mild cardiomegaly. Lungs clear. No effusions. No acute bony abnormality. IMPRESSION: No active cardiopulmonary disease. Electronically Signed   By: Rolm Baptise M.D.   On: 08/22/2018 00:27     Cardiac Studies    Patient Profile      62 year old gentleman with a history of coronary artery disease.  He remains on Repatha and Zetia.  He is had a history of rhabdomyolysis with statin therapy.  Assessment & Plan    1.  Coronary artery disease/non-ST segment elevation myocardial infarction: Patient appears to be comfortable.  He complains of some chest pain this morning but this may be residual pain from his heart attack yesterday.  We will get an EKG.  We have ordered some nitroglycerin.  The plan is to proceed with heart catheterization once his INR is less than 1.8.  ECG performed this am reveals no significant ischemic changes compared to previous   2.  Hyperlipidemia: Continue Repatha and Zetia.  3.  History of LV thrombus: He is currently on Coumadin.  INR is 2.4.  We will start him on heparin once his INR is less than 2.  We may need to initiate heparin earlier  if he is having dynamic EKG changes.  4.  Chronic combined systolic and diastolic congestive heart failure: His ejection fractions around 20 to 25%.  Continue current medications including carvedilol, digoxin, losartan, torsemide, spironolactone  For questions or updates, please contact [place] Please consult [email or web address] for contact info under        Signed, Mertie Moores, MD  08/23/2018, 7:44 AM

## 2018-08-24 DIAGNOSIS — I5042 Chronic combined systolic (congestive) and diastolic (congestive) heart failure: Secondary | ICD-10-CM | POA: Diagnosis present

## 2018-08-24 DIAGNOSIS — I5022 Chronic systolic (congestive) heart failure: Secondary | ICD-10-CM | POA: Diagnosis not present

## 2018-08-24 DIAGNOSIS — I361 Nonrheumatic tricuspid (valve) insufficiency: Secondary | ICD-10-CM | POA: Diagnosis not present

## 2018-08-24 DIAGNOSIS — I214 Non-ST elevation (NSTEMI) myocardial infarction: Secondary | ICD-10-CM | POA: Diagnosis present

## 2018-08-24 DIAGNOSIS — M1990 Unspecified osteoarthritis, unspecified site: Secondary | ICD-10-CM | POA: Diagnosis present

## 2018-08-24 DIAGNOSIS — I208 Other forms of angina pectoris: Secondary | ICD-10-CM | POA: Diagnosis not present

## 2018-08-24 DIAGNOSIS — I110 Hypertensive heart disease with heart failure: Secondary | ICD-10-CM | POA: Diagnosis present

## 2018-08-24 DIAGNOSIS — R7989 Other specified abnormal findings of blood chemistry: Secondary | ICD-10-CM | POA: Diagnosis not present

## 2018-08-24 DIAGNOSIS — E1165 Type 2 diabetes mellitus with hyperglycemia: Secondary | ICD-10-CM | POA: Diagnosis not present

## 2018-08-24 DIAGNOSIS — G4733 Obstructive sleep apnea (adult) (pediatric): Secondary | ICD-10-CM | POA: Diagnosis present

## 2018-08-24 DIAGNOSIS — I2510 Atherosclerotic heart disease of native coronary artery without angina pectoris: Secondary | ICD-10-CM | POA: Diagnosis not present

## 2018-08-24 DIAGNOSIS — E1140 Type 2 diabetes mellitus with diabetic neuropathy, unspecified: Secondary | ICD-10-CM | POA: Diagnosis present

## 2018-08-24 DIAGNOSIS — Z8673 Personal history of transient ischemic attack (TIA), and cerebral infarction without residual deficits: Secondary | ICD-10-CM | POA: Diagnosis not present

## 2018-08-24 DIAGNOSIS — I255 Ischemic cardiomyopathy: Secondary | ICD-10-CM | POA: Diagnosis not present

## 2018-08-24 DIAGNOSIS — Z7902 Long term (current) use of antithrombotics/antiplatelets: Secondary | ICD-10-CM | POA: Diagnosis not present

## 2018-08-24 DIAGNOSIS — Z95810 Presence of automatic (implantable) cardiac defibrillator: Secondary | ICD-10-CM | POA: Diagnosis not present

## 2018-08-24 DIAGNOSIS — Z79899 Other long term (current) drug therapy: Secondary | ICD-10-CM | POA: Diagnosis not present

## 2018-08-24 DIAGNOSIS — I340 Nonrheumatic mitral (valve) insufficiency: Secondary | ICD-10-CM | POA: Diagnosis present

## 2018-08-24 DIAGNOSIS — I2582 Chronic total occlusion of coronary artery: Secondary | ICD-10-CM | POA: Diagnosis present

## 2018-08-24 DIAGNOSIS — E785 Hyperlipidemia, unspecified: Secondary | ICD-10-CM | POA: Diagnosis present

## 2018-08-24 DIAGNOSIS — Z955 Presence of coronary angioplasty implant and graft: Secondary | ICD-10-CM | POA: Diagnosis not present

## 2018-08-24 DIAGNOSIS — E7800 Pure hypercholesterolemia, unspecified: Secondary | ICD-10-CM | POA: Diagnosis present

## 2018-08-24 DIAGNOSIS — Z87891 Personal history of nicotine dependence: Secondary | ICD-10-CM | POA: Diagnosis not present

## 2018-08-24 DIAGNOSIS — I25119 Atherosclerotic heart disease of native coronary artery with unspecified angina pectoris: Secondary | ICD-10-CM | POA: Diagnosis present

## 2018-08-24 DIAGNOSIS — K219 Gastro-esophageal reflux disease without esophagitis: Secondary | ICD-10-CM | POA: Diagnosis present

## 2018-08-24 DIAGNOSIS — I252 Old myocardial infarction: Secondary | ICD-10-CM | POA: Diagnosis not present

## 2018-08-24 DIAGNOSIS — F329 Major depressive disorder, single episode, unspecified: Secondary | ICD-10-CM | POA: Diagnosis present

## 2018-08-24 DIAGNOSIS — Z7901 Long term (current) use of anticoagulants: Secondary | ICD-10-CM | POA: Diagnosis not present

## 2018-08-24 DIAGNOSIS — I959 Hypotension, unspecified: Secondary | ICD-10-CM | POA: Diagnosis present

## 2018-08-24 LAB — GLUCOSE, CAPILLARY
GLUCOSE-CAPILLARY: 157 mg/dL — AB (ref 70–99)
GLUCOSE-CAPILLARY: 298 mg/dL — AB (ref 70–99)
Glucose-Capillary: 170 mg/dL — ABNORMAL HIGH (ref 70–99)
Glucose-Capillary: 173 mg/dL — ABNORMAL HIGH (ref 70–99)
Glucose-Capillary: 276 mg/dL — ABNORMAL HIGH (ref 70–99)
Glucose-Capillary: 425 mg/dL — ABNORMAL HIGH (ref 70–99)
Glucose-Capillary: 426 mg/dL — ABNORMAL HIGH (ref 70–99)
Glucose-Capillary: 427 mg/dL — ABNORMAL HIGH (ref 70–99)

## 2018-08-24 LAB — PROTIME-INR
INR: 1.9
Prothrombin Time: 21.5 seconds — ABNORMAL HIGH (ref 11.4–15.2)

## 2018-08-24 LAB — HEPARIN LEVEL (UNFRACTIONATED): Heparin Unfractionated: 0.25 IU/mL — ABNORMAL LOW (ref 0.30–0.70)

## 2018-08-24 MED ORDER — HEPARIN BOLUS VIA INFUSION
4000.0000 [IU] | Freq: Once | INTRAVENOUS | Status: AC
  Administered 2018-08-24: 4000 [IU] via INTRAVENOUS
  Filled 2018-08-24: qty 4000

## 2018-08-24 MED ORDER — HEPARIN (PORCINE) IN NACL 100-0.45 UNIT/ML-% IJ SOLN
1200.0000 [IU]/h | INTRAMUSCULAR | Status: DC
  Administered 2018-08-24: 950 [IU]/h via INTRAVENOUS
  Administered 2018-08-25: 1200 [IU]/h via INTRAVENOUS
  Filled 2018-08-24 (×2): qty 250

## 2018-08-24 MED ORDER — SODIUM CHLORIDE 0.9 % WEIGHT BASED INFUSION
3.0000 mL/kg/h | INTRAVENOUS | Status: DC
  Administered 2018-08-25: 3 mL/kg/h via INTRAVENOUS

## 2018-08-24 MED ORDER — SODIUM CHLORIDE 0.9 % WEIGHT BASED INFUSION
1.0000 mL/kg/h | INTRAVENOUS | Status: DC
  Administered 2018-08-25: 1 mL/kg/h via INTRAVENOUS

## 2018-08-24 MED ORDER — SODIUM CHLORIDE 0.9 % IV SOLN: 250.0000 mL | INTRAVENOUS | Status: DC | PRN

## 2018-08-24 MED ORDER — SODIUM CHLORIDE 0.9% FLUSH: 3.0000 mL | INTRAVENOUS | Status: DC | PRN

## 2018-08-24 MED ORDER — INSULIN ASPART 100 UNIT/ML ~~LOC~~ SOLN
11.0000 [IU] | Freq: Once | SUBCUTANEOUS | Status: AC
  Administered 2018-08-24: 11 [IU] via SUBCUTANEOUS

## 2018-08-24 MED ORDER — SODIUM CHLORIDE 0.9% FLUSH
3.0000 mL | Freq: Two times a day (BID) | INTRAVENOUS | Status: DC
  Administered 2018-08-25: 3 mL via INTRAVENOUS

## 2018-08-24 MED ORDER — ASPIRIN 81 MG PO CHEW
81.0000 mg | CHEWABLE_TABLET | ORAL | Status: AC
  Administered 2018-08-25: 81 mg via ORAL
  Filled 2018-08-24: qty 1

## 2018-08-24 NOTE — Progress Notes (Signed)
[place] for warfarin > heparin Indication: ACS/STEMI, LV thrombus  Allergies  Allergen Reactions  . Pork-Derived Products Other (See Comments)    Does not eat for religious reasons - okay with using IV heparin    Patient Measurements: Height: 5' 6.5" (168.9 cm) Weight: 176 lb 14.4 oz (80.2 kg) IBW/kg (Calculated) : 64.95 Heparin Dosing Weight: 80 kg  Vital Signs: Temp: 98.4 F (36.9 C) (10/27 0422) Temp Source: Oral (10/27 0422) BP: 94/74 (10/27 0855) Pulse Rate: 68 (10/27 0855)  Labs: Recent Labs    08/22/18 0018 08/22/18 0255 08/22/18 0610 08/22/18 1227 08/23/18 0501 08/24/18 0445  HGB 13.1  --  12.6*  --   --   --   HCT 41.5  --  40.3  --   --   --   PLT 255  --  264  --   --   --   LABPROT 31.9*  --   --   --  26.3* 21.5*  INR 3.15  --   --   --  2.46 1.90  CREATININE 1.29*  --  1.12  --   --   --   TROPONINI 0.16* 1.22* 2.32* 2.05*  --   --     Estimated Creatinine Clearance: 68.8 mL/min (by C-G formula based on SCr of 1.12 mg/dL).   Medical History: Past Medical History:  Diagnosis Date  . AICD (automatic cardioverter/defibrillator) present   . Anxiety   . Arthritis    "all over" (09/17/2017)  . Burn   . CAD (coronary artery disease)    a. NSTEMI s/p BMS to 1st Diagonal and distal OM2 in 2007; b. STEMI 03/26/12 s/p BMS to RCA; c. NSTEMI 10/2012 : CTO of LCx (unable to open with PCI) and PL branch, mod dz of LAD and diagonal, and preserved LV systolic fxn, Med Rx;  d.  anterior STEMI in 01/2017 with DES to Proximal LAD  . Cardiomyopathy EF 35% on cath 06/30/14, new from jan 2015 07/20/2014  . Chest pain   . CHF (congestive heart failure) ([place])   . Chronic back pain    "all over" (09/17/2017)  . DDD (degenerative disc disease), cervical   . Depression   . GERD (gastroesophageal reflux disease)   . Headache    "a few/wk" (09/17/2017)  . HTN (hypertension)   . Hypercholesterolemia   . Mental disorder   .  Myocardial infarction ([place])    "I've had 7" (09/17/2017)  . Pulmonary edema   . Respiratory failure ([place])   . Sciatic pain   . Sleep apnea   . Stroke [facility])    a. multiple dating back to 2002; *I''ve had 5; LUE/LLE weaker since" (09/17/2017)  . Tibia fracture (l) leg  . Tobacco abuse   . Type 2 diabetes mellitus ([place])   . Unstable angina (HCC)     Medications:  Medications Prior to Admission  Medication Sig Dispense Refill Last Dose  . acetaminophen (TYLENOL) 325 MG tablet Take 2 tablets (650 mg total) by mouth every 6 (six) hours as needed for mild pain, fever or headache. 40 tablet 0 unk  . digoxin (LANOXIN) 0.125 MG tablet Take 1 tablet (0.125 mg total) by mouth daily. 30 tablet 3 08/21/2018 at Unknown time  . Evolocumab (REPATHA SURECLICK) 952 MG/ML SOAJ Inject 1 pen into the skin every 14 (fourteen) days. 2 pen 3 08/13/2018  . ezetimibe (ZETIA) 10 MG tablet Take 1 tablet (10 mg total) by mouth daily. Advance  tablet 3 08/21/2018 at Unknown time  . fenofibrate (TRICOR) 145 MG tablet Take 1 tablet (145 mg total) by mouth daily. 30 tablet 6 08/21/2018 at Unknown time  . glipiZIDE (GLUCOTROL XL) 10 MG 24 hr tablet Take 1 tablet (10 mg total) by mouth daily. 30 tablet 1 08/21/2018 at Unknown time  . losartan (COZAAR) 25 MG tablet Take 0.5 tablets (12.5 mg total) by mouth daily. 15 tablet 3 08/21/2018 at Unknown time  . meclizine (ANTIVERT) 12.5 MG tablet Take 1 tablet (12.5 mg total) by mouth 3 (three) times daily as needed for dizziness. 30 tablet 0 08/21/2018 at Unknown time  . nicotine (NICODERM CQ - DOSED IN MG/24 HOURS) 21 mg/24hr patch Place 1 patch (21 mg total) onto the skin daily. 28 patch 0 08/21/2018 at Unknown time  . nitroGLYCERIN (NITROSTAT) 0.4 MG SL tablet PLACE ONE (1) TABLET UNDER TONGUE EVERY 5 MINUTES UP TO (3) DOSES AS NEEDED FOR CHEST PAIN. IF NO RELIEF, CONTACT MD. (Patient taking differently: Place 0.4 mg under the tongue every 5 (five) minutes as needed for chest pain. )  25 tablet 3 08/21/2018 at Unknown time  . potassium chloride SA (K-DUR,KLOR-CON) 20 MEQ tablet Take 1 tablet (20 mEq total) by mouth daily. 30 tablet 3 08/21/2018 at Unknown time  . torsemide (DEMADEX) 20 MG tablet Take 1 tablet (20 mg total) by mouth daily. 30 tablet 2 08/20/2018  . torsemide (DEMADEX) 20 MG tablet Take 20 mg by mouth daily as needed (weight gain greater than 3lbs overnight or greater than 5lbs in one week).   07/30/2018  . carvedilol (COREG) 6.25 MG tablet Take 1 tablet (6.25 mg total) by mouth 2 (two) times daily with a meal. (Patient not taking: Reported on 08/22/2018) 60 tablet 6 Not Taking at Unknown time  . gabapentin (NEURONTIN) 100 MG capsule Take 1 capsule (100 mg total) by mouth 2 (two) times daily. (Patient not taking: Reported on 08/22/2018) 60 capsule 0 Not Taking at Unknown time  . Icosapent Ethyl (VASCEPA) 1 g CAPS Take 2 capsules (2 g total) by mouth 2 (two) times daily. (Patient not taking: Reported on 07/30/2018) 120 capsule 3 Not Taking at Unknown time  . pantoprazole (PROTONIX) 40 MG tablet Take 1 tablet (40 mg total) by mouth 2 (two) times daily. (Patient not taking: Reported on 08/22/2018) 60 tablet 1 Not Taking at Unknown time  . spironolactone (ALDACTONE) 25 MG tablet Take 1 tablet (25 mg total) by mouth at bedtime. (Patient not taking: Reported on 08/22/2018) 30 tablet 3 Not Taking at Unknown time  . warfarin (COUMADIN) 5 MG tablet Hold coumadin tonight then decrease dose to 5mg  daily (Patient not taking: Reported on 08/22/2018) 45 tablet 11 Not Taking at Unknown time    Assessment: 37 yom on warfarin PTA for hx LV thrombus presenting with CP, elevated troponin. Pharmacy consulted to dose heparin for ACS when INR<2. Plan is for cath when INR<1.8.  INR now 1.9 and will initiate heparin as planned, CBC stable and no bleeding reported.  Goal of Therapy:  Heparin level 0.3-0.7 units/ml Monitor platelets by anticoagulation protocol: Yes   Plan:  Heparin 4000  units x 1, and heparin gtt at 950 units/hr Hold warfarin in anticipation of cath F/u 6 hour heparin level Monitor INR, heparin level, s/s bleeding  Bertis Ruddy, PharmD Clinical Pharmacist Please check AMION for all [place] numbers 08/24/2018 9:07 AM

## 2018-08-24 NOTE — Progress Notes (Signed)
[place] for warfarin > heparin Indication: ACS/STEMI, LV thrombus  Allergies  Allergen Reactions  . Pork-Derived Products Other (See Comments)    Does not eat for religious reasons - okay with using IV heparin    Patient Measurements: Height: 5' 6.5" (168.9 cm) Weight: 176 lb 14.4 oz (80.2 kg) IBW/kg (Calculated) : 64.95 Heparin Dosing Weight: 80 kg  Vital Signs: Temp: 98.7 F (37.1 C) (10/27 1150) Temp Source: Oral (10/27 1150) BP: 101/70 (10/27 1150) Pulse Rate: 69 (10/27 1150)  Labs: Recent Labs    08/22/18 0018 08/22/18 0255 08/22/18 0610 08/22/18 1227 08/23/18 0501 08/24/18 0445 08/24/18 1511  HGB 13.1  --  12.6*  --   --   --   --   HCT 41.5  --  40.3  --   --   --   --   PLT 255  --  264  --   --   --   --   LABPROT 31.9*  --   --   --  26.3* 21.5*  --   INR 3.15  --   --   --  2.46 1.90  --   HEPARINUNFRC  --   --   --   --   --   --  0.25*  CREATININE 1.29*  --  1.12  --   --   --   --   TROPONINI 0.16* 1.22* 2.32* 2.05*  --   --   --     Estimated Creatinine Clearance: 68.8 mL/min (by C-G formula based on SCr of 1.12 mg/dL).   Medical History: Past Medical History:  Diagnosis Date  . AICD (automatic cardioverter/defibrillator) present   . Anxiety   . Arthritis    "all over" (09/17/2017)  . Burn   . CAD (coronary artery disease)    a. NSTEMI s/p BMS to 1st Diagonal and distal OM2 in 2007; b. STEMI 03/26/12 s/p BMS to RCA; c. NSTEMI 10/2012 : CTO of LCx (unable to open with PCI) and PL branch, mod dz of LAD and diagonal, and preserved LV systolic fxn, Med Rx;  d.  anterior STEMI in 01/2017 with DES to Proximal LAD  . Cardiomyopathy EF 35% on cath 06/30/14, new from jan 2015 07/20/2014  . Chest pain   . CHF (congestive heart failure) ([place])   . Chronic back pain    "all over" (09/17/2017)  . DDD (degenerative disc disease), cervical   . Depression   . GERD (gastroesophageal reflux disease)   . Headache    "a  few/wk" (09/17/2017)  . HTN (hypertension)   . Hypercholesterolemia   . Mental disorder   . Myocardial infarction ([place])    "I've had 7" (09/17/2017)  . Pulmonary edema   . Respiratory failure ([place])   . Sciatic pain   . Sleep apnea   . Stroke [facility])    a. multiple dating back to 2002; *I''ve had 5; LUE/LLE weaker since" (09/17/2017)  . Tibia fracture (l) leg  . Tobacco abuse   . Type 2 diabetes mellitus ([place])   . Unstable angina (HCC)     Medications:  Medications Prior to Admission  Medication Sig Dispense Refill Last Dose  . acetaminophen (TYLENOL) 325 MG tablet Take 2 tablets (650 mg total) by mouth every 6 (six) hours as needed for mild pain, fever or headache. 40 tablet 0 unk  . digoxin (LANOXIN) 0.125 MG tablet Take 1 tablet (0.125 mg total) by mouth daily. 30 tablet  3 08/21/2018 at Unknown time  . Evolocumab (REPATHA SURECLICK) 102 MG/ML SOAJ Inject 1 pen into the skin every 14 (fourteen) days. 2 pen 3 08/13/2018  . ezetimibe (ZETIA) 10 MG tablet Take 1 tablet (10 mg total) by mouth daily. 30 tablet 3 08/21/2018 at Unknown time  . fenofibrate (TRICOR) 145 MG tablet Take 1 tablet (145 mg total) by mouth daily. 30 tablet 6 08/21/2018 at Unknown time  . glipiZIDE (GLUCOTROL XL) 10 MG 24 hr tablet Take 1 tablet (10 mg total) by mouth daily. 30 tablet 1 08/21/2018 at Unknown time  . losartan (COZAAR) 25 MG tablet Take 0.5 tablets (12.5 mg total) by mouth daily. 15 tablet 3 08/21/2018 at Unknown time  . meclizine (ANTIVERT) 12.5 MG tablet Take 1 tablet (12.5 mg total) by mouth 3 (three) times daily as needed for dizziness. 30 tablet 0 08/21/2018 at Unknown time  . nicotine (NICODERM CQ - DOSED IN MG/24 HOURS) 21 mg/24hr patch Place 1 patch (21 mg total) onto the skin daily. 28 patch 0 08/21/2018 at Unknown time  . nitroGLYCERIN (NITROSTAT) 0.4 MG SL tablet PLACE ONE (1) TABLET UNDER TONGUE EVERY 5 MINUTES UP TO (3) DOSES AS NEEDED FOR CHEST PAIN. IF NO RELIEF, CONTACT MD. (Patient taking  differently: Place 0.4 mg under the tongue every 5 (five) minutes as needed for chest pain. ) 25 tablet 3 08/21/2018 at Unknown time  . potassium chloride SA (K-DUR,KLOR-CON) 20 MEQ tablet Take 1 tablet (20 mEq total) by mouth daily. 30 tablet 3 08/21/2018 at Unknown time  . torsemide (DEMADEX) 20 MG tablet Take 1 tablet (20 mg total) by mouth daily. 30 tablet 2 08/20/2018  . torsemide (DEMADEX) 20 MG tablet Take 20 mg by mouth daily as needed (weight gain greater than 3lbs overnight or greater than 5lbs in one week).   07/30/2018  . carvedilol (COREG) 6.25 MG tablet Take 1 tablet (6.25 mg total) by mouth 2 (two) times daily with a meal. (Patient not taking: Reported on 08/22/2018) 60 tablet 6 Not Taking at Unknown time  . gabapentin (NEURONTIN) 100 MG capsule Take 1 capsule (100 mg total) by mouth 2 (two) times daily. (Patient not taking: Reported on 08/22/2018) 60 capsule 0 Not Taking at Unknown time  . Icosapent Ethyl (VASCEPA) 1 g CAPS Take 2 capsules (2 g total) by mouth 2 (two) times daily. (Patient not taking: Reported on 07/30/2018) 120 capsule 3 Not Taking at Unknown time  . pantoprazole (PROTONIX) 40 MG tablet Take 1 tablet (40 mg total) by mouth 2 (two) times daily. (Patient not taking: Reported on 08/22/2018) 60 tablet 1 Not Taking at Unknown time  . spironolactone (ALDACTONE) 25 MG tablet Take 1 tablet (25 mg total) by mouth at bedtime. (Patient not taking: Reported on 08/22/2018) 30 tablet 3 Not Taking at Unknown time  . warfarin (COUMADIN) 5 MG tablet Hold coumadin tonight then decrease dose to 5mg  daily (Patient not taking: Reported on 08/22/2018) 45 tablet 11 Not Taking at Unknown time    Assessment: 60 yom on warfarin PTA for hx LV thrombus presenting with CP, elevated troponin. Pharmacy consulted to dose heparin for ACS when INR<2. Plan is for cath when INR<1.8.  Heparin started 10/27 AM with INR<2. Initial heparin level low at 0.25. CBC stable. No bleeding or issues with infusion per  discussion with RN.  Goal of Therapy:  Heparin level 0.3-0.7 units/ml Monitor platelets by anticoagulation protocol: Yes   Plan:  Increase heparin gtt to 1100 units/hr Hold warfarin in  anticipation of cath F/u 6 hour heparin level Monitor INR, heparin level, s/s bleeding  Elicia Lamp, PharmD, BCPS Please check AMION for all [place] contact numbers Clinical Pharmacist 08/24/2018 4:14 PM

## 2018-08-24 NOTE — Plan of Care (Signed)
  Problem: Clinical Measurements: Goal: Diagnostic test results will improve Outcome: Progressing   

## 2018-08-24 NOTE — Progress Notes (Signed)
Progress Note  Patient Name: Daniel Li Date of Encounter: 08/24/2018  Primary Cardiologist: Carlyle Dolly, MD  / Aundra Dubin / [place]   Subjective   [name] is a 62 year old gentleman with a history of coronary artery disease-Status post BMS to D1 and OM 2 in 2007, status post BMS to RCA in May, 2013, CTO of the left circumflex artery in 2014.  Most recently was status post an anterior ST segment elevation myocardial infarction in April, 2018 with placement  of a DES to the proximal LAD.  Has a history of left ventricular  Thrombus and has been on Coumadin.  We are asked to see him yesterday by Dr. Maudie Mercury for further evaluation of an NSTEMI. Peak troponin was 2.32 on October 25 at 6:10  AM. His INR level today is 1.9.   Inpatient Medications    Scheduled Meds: . aspirin EC  81 mg Oral Daily  . carvedilol  6.25 mg Oral BID WC  . digoxin  0.125 mg Oral Daily  . ezetimibe  10 mg Oral Daily  . fenofibrate  160 mg Oral Daily  . gabapentin  100 mg Oral BID  . heparin  4,000 Units Intravenous Once  . insulin aspart  0-9 Units Subcutaneous Q4H  . losartan  12.5 mg Oral Daily  . nicotine  21 mg Transdermal Daily  . pantoprazole  40 mg Oral BID  . potassium chloride SA  20 mEq Oral Daily  . sodium chloride flush  3 mL Intravenous Q12H  . spironolactone  25 mg Oral QHS  . torsemide  20 mg Oral Daily   Continuous Infusions: . sodium chloride    . heparin     PRN Meds: sodium chloride, acetaminophen **OR** acetaminophen, meclizine, morphine injection, nitroGLYCERIN, sodium chloride flush   Vital Signs    Vitals:   08/24/18 0013 08/24/18 0422 08/24/18 0424 08/24/18 0855  BP: 100/79 93/76  94/74  Pulse: 72 70  68  Resp:  20  18  Temp: 98.5 F (36.9 C) 98.4 F (36.9 C)    TempSrc: Oral Oral    SpO2: 92% 96%  97%  Weight:   80.2 kg   Height:        Intake/Output Summary (Last 24 hours) at 08/24/2018 0921 Last data filed at 08/23/2018 1700 Gross per 24 hour    Intake 600 ml  Output -  Net 600 ml   Filed Weights   08/22/18 1518 08/23/18 0412 08/24/18 0424  Weight: 80 kg 80.2 kg 80.2 kg    Telemetry     NSR   - Personally Reviewed  ECG    NSR   - Personally Reviewed  Physical Exam    Physical Exam: Blood pressure 94/74, pulse 68, temperature 98.4 F (36.9 C), temperature source Oral, resp. rate 18, height 5' 6.5" (1.689 m), weight 80.2 kg, SpO2 97 %.  GEN:   Middle-aged gentleman, no acute distress, no chest pain this morning. HEENT: Normal NECK: No JVD; No carotid bruits LYMPHATICS: No lymphadenopathy CARDIAC: Regular rate S1-S2. RESPIRATORY:  Clear to auscultation without rales, wheezing or rhonchi  ABDOMEN: Soft, non-tender, non-distended MUSCULOSKELETAL: Radial pulses 2+. SKIN: Warm and dry NEUROLOGIC:  Alert and oriented x 3  Labs    Chemistry Recent Labs  Lab 08/22/18 0018 08/22/18 0610  NA 132* 135  K 3.8 3.9  CL 96* 100  CO2 26 28  GLUCOSE 359* 306*  BUN 15 14  CREATININE 1.29* 1.12  CALCIUM 9.3 9.2  PROT  --  7.0  ALBUMIN  --  3.8  AST  --  30  ALT  --  18  ALKPHOS  --  53  BILITOT  --  0.4  GFRNONAA 58* >60  GFRAA >60 >60  ANIONGAP 10 7     Hematology Recent Labs  Lab 08/22/18 0018 08/22/18 0610  WBC 10.1 8.8  RBC 5.25 5.12  HGB 13.1 12.6*  HCT 41.5 40.3  MCV 79.0* 78.7*  MCH 25.0* 24.6*  MCHC 31.6 31.3  RDW 14.8 14.9  PLT 255 264    Cardiac Enzymes Recent Labs  Lab 08/22/18 0018 08/22/18 0255 08/22/18 0610 08/22/18 1227  TROPONINI 0.16* 1.22* 2.32* 2.05*   No results for input(s): TROPIPOC in the last 168 hours.   BNPNo results for input(s): BNP, PROBNP in the last 168 hours.   DDimer No results for input(s): DDIMER in the last 168 hours.   Radiology    No results found.  Cardiac Studies    Patient Profile      62 year old gentleman with a history of coronary artery disease.  He remains on Repatha and Zetia.  He is had a history of rhabdomyolysis with statin  therapy.  Assessment & Plan    1.  Coronary artery disease/non-ST segment elevation myocardial infarction: Appears to be comfortable this morning.  He is not having any chest pain.  His INR is 1.9.  He should be stable for heart catheterization tomorrow. We discussed the risks, benefits, options of heart catheterization.  He understands and agrees to proceed.  2.  Hyperlipidemia: Continue current medications including Repatha and Zetia.  3.  History of LV thrombus: He has been on Coumadin.  His INR is 1.9 today.  We will start heparin drip.  4.  Chronic combined systolic and diastolic congestive heart failure: His ejection fractions around 20 to 25%.  Continue current heart failure medications including carvedilol, digoxin, losartan, spironolactone, torsemide  For questions or updates, please contact [place] Please consult [email or web address] for contact info under        Signed, Mertie Moores, MD  08/24/2018, 9:21 AM

## 2018-08-24 NOTE — H&P (View-Only) (Signed)
Progress Note  Patient Name: Daniel Li Date of Encounter: 08/24/2018  Primary Cardiologist: Carlyle Dolly, MD  / Aundra Dubin / [place]   Subjective   Daniel Li is a 62 year old gentleman with a history of coronary artery disease-Status post BMS to D1 and OM 2 in 2007, status post BMS to RCA in May, 2013, CTO of the left circumflex artery in 2014.  Most recently was status post an anterior ST segment elevation myocardial infarction in April, 2018 with placement  of a DES to the proximal LAD.  Has a history of left ventricular  Thrombus and has been on Coumadin.  We are asked to see him yesterday by Dr. Maudie Mercury for further evaluation of an NSTEMI. Peak troponin was 2.32 on October 25 at 6:10  AM. His INR level today is 1.9.   Inpatient Medications    Scheduled Meds: . aspirin EC  81 mg Oral Daily  . carvedilol  6.25 mg Oral BID WC  . digoxin  0.125 mg Oral Daily  . ezetimibe  10 mg Oral Daily  . fenofibrate  160 mg Oral Daily  . gabapentin  100 mg Oral BID  . heparin  4,000 Units Intravenous Once  . insulin aspart  0-9 Units Subcutaneous Q4H  . losartan  12.5 mg Oral Daily  . nicotine  21 mg Transdermal Daily  . pantoprazole  40 mg Oral BID  . potassium chloride SA  20 mEq Oral Daily  . sodium chloride flush  3 mL Intravenous Q12H  . spironolactone  25 mg Oral QHS  . torsemide  20 mg Oral Daily   Continuous Infusions: . sodium chloride    . heparin     PRN Meds: sodium chloride, acetaminophen **OR** acetaminophen, meclizine, morphine injection, nitroGLYCERIN, sodium chloride flush   Vital Signs    Vitals:   08/24/18 0013 08/24/18 0422 08/24/18 0424 08/24/18 0855  BP: 100/79 93/76  94/74  Pulse: 72 70  68  Resp:  20  18  Temp: 98.5 F (36.9 C) 98.4 F (36.9 C)    TempSrc: Oral Oral    SpO2: 92% 96%  97%  Weight:   80.2 kg   Height:        Intake/Output Summary (Last 24 hours) at 08/24/2018 0921 Last data filed at 08/23/2018 1700 Gross per 24 hour    Intake 600 ml  Output -  Net 600 ml   Filed Weights   08/22/18 1518 08/23/18 0412 08/24/18 0424  Weight: 80 kg 80.2 kg 80.2 kg    Telemetry     NSR   - Personally Reviewed  ECG    NSR   - Personally Reviewed  Physical Exam    Physical Exam: Blood pressure 94/74, pulse 68, temperature 98.4 F (36.9 C), temperature source Oral, resp. rate 18, height 5' 6.5" (1.689 m), weight 80.2 kg, SpO2 97 %.  GEN:   Middle-aged gentleman, no acute distress, no chest pain this morning. HEENT: Normal NECK: No JVD; No carotid bruits LYMPHATICS: No lymphadenopathy CARDIAC: Regular rate S1-S2. RESPIRATORY:  Clear to auscultation without rales, wheezing or rhonchi  ABDOMEN: Soft, non-tender, non-distended MUSCULOSKELETAL: Radial pulses 2+. SKIN: Warm and dry NEUROLOGIC:  Alert and oriented x 3  Labs    Chemistry Recent Labs  Lab 08/22/18 0018 08/22/18 0610  NA 132* 135  K 3.8 3.9  CL 96* 100  CO2 26 28  GLUCOSE 359* 306*  BUN 15 14  CREATININE 1.29* 1.12  CALCIUM 9.3 9.2  PROT  --  7.0  ALBUMIN  --  3.8  AST  --  30  ALT  --  18  ALKPHOS  --  53  BILITOT  --  0.4  GFRNONAA 58* >60  GFRAA >60 >60  ANIONGAP 10 7     Hematology Recent Labs  Lab 08/22/18 0018 08/22/18 0610  WBC 10.1 8.8  RBC 5.25 5.12  HGB 13.1 12.6*  HCT 41.5 40.3  MCV 79.0* 78.7*  MCH 25.0* 24.6*  MCHC 31.6 31.3  RDW 14.8 14.9  PLT 255 264    Cardiac Enzymes Recent Labs  Lab 08/22/18 0018 08/22/18 0255 08/22/18 0610 08/22/18 1227  TROPONINI 0.16* 1.22* 2.32* 2.05*   No results for input(s): TROPIPOC in the last 168 hours.   BNPNo results for input(s): BNP, PROBNP in the last 168 hours.   DDimer No results for input(s): DDIMER in the last 168 hours.   Radiology    No results found.  Cardiac Studies    Patient Profile      62 year old gentleman with a history of coronary artery disease.  He remains on Repatha and Zetia.  He is had a history of rhabdomyolysis with statin  therapy.  Assessment & Plan    1.  Coronary artery disease/non-ST segment elevation myocardial infarction: Appears to be comfortable this morning.  He is not having any chest pain.  His INR is 1.9.  He should be stable for heart catheterization tomorrow. We discussed the risks, benefits, options of heart catheterization.  He understands and agrees to proceed.  2.  Hyperlipidemia: Continue current medications including Repatha and Zetia.  3.  History of LV thrombus: He has been on Coumadin.  His INR is 1.9 today.  We will start heparin drip.  4.  Chronic combined systolic and diastolic congestive heart failure: His ejection fractions around 20 to 25%.  Continue current heart failure medications including carvedilol, digoxin, losartan, spironolactone, torsemide  For questions or updates, please contact [place] Please consult [email or web address] for contact info under        Signed, Mertie Moores, MD  08/24/2018, 9:21 AM

## 2018-08-25 ENCOUNTER — Other Ambulatory Visit (HOSPITAL_COMMUNITY): Payer: Medicaid Other

## 2018-08-25 ENCOUNTER — Inpatient Hospital Stay (HOSPITAL_COMMUNITY)
Admission: EM | Disposition: A | Discharge status: Home / Self Care | Payer: Self-pay | Source: Home / Self Care | Attending: Cardiovascular Disease

## 2018-08-25 DIAGNOSIS — I2510 Atherosclerotic heart disease of native coronary artery without angina pectoris: Secondary | ICD-10-CM

## 2018-08-25 HISTORY — PX: CORONARY STENT INTERVENTION: CATH118234

## 2018-08-25 LAB — POCT I-STAT 3, ART BLOOD GAS (G3+)
Acid-Base Excess: 1 mmol/L (ref 0.0–2.0)
BICARBONATE: 26.2 mmol/L (ref 20.0–28.0)
O2 Saturation: 95 %
PCO2 ART: 41.5 mmHg (ref 32.0–48.0)
PH ART: 7.409 (ref 7.350–7.450)
TCO2: 27 mmol/L (ref 22–32)
pO2, Arterial: 75 mmHg — ABNORMAL LOW (ref 83.0–108.0)

## 2018-08-25 LAB — CBC
HCT: 40.9 % (ref 39.0–52.0)
HCT: 40.7 % (ref 39.0–52.0)
Hemoglobin: 13.2 g/dL (ref 13.0–17.0)
Hemoglobin: 13.2 g/dL (ref 13.0–17.0)
MCH: 25.1 pg — AB (ref 26.0–34.0)
MCH: 25.2 pg — ABNORMAL LOW (ref 26.0–34.0)
MCHC: 32.3 g/dL (ref 30.0–36.0)
MCHC: 32.4 g/dL (ref 30.0–36.0)
MCV: 77.9 fL — ABNORMAL LOW (ref 80.0–100.0)
MCV: 77.8 fL — ABNORMAL LOW (ref 80.0–100.0)
PLATELETS: 260 10*3/uL (ref 150–400)
Platelets: 269 10*3/uL (ref 150–400)
RBC: 5.25 MIL/uL (ref 4.22–5.81)
RBC: 5.23 MIL/uL (ref 4.22–5.81)
RDW: 14.7 % (ref 11.5–15.5)
RDW: 14.8 % (ref 11.5–15.5)
WBC: 6 10*3/uL (ref 4.0–10.5)
WBC: 6.8 10*3/uL (ref 4.0–10.5)
nRBC: 0 % (ref 0.0–0.2)
nRBC: 0 % (ref 0.0–0.2)

## 2018-08-25 LAB — GLUCOSE, CAPILLARY
GLUCOSE-CAPILLARY: 127 mg/dL — AB (ref 70–99)
GLUCOSE-CAPILLARY: 332 mg/dL — AB (ref 70–99)
Glucose-Capillary: 126 mg/dL — ABNORMAL HIGH (ref 70–99)
Glucose-Capillary: 138 mg/dL — ABNORMAL HIGH (ref 70–99)
Glucose-Capillary: 144 mg/dL — ABNORMAL HIGH (ref 70–99)
Glucose-Capillary: 173 mg/dL — ABNORMAL HIGH (ref 70–99)
Glucose-Capillary: 182 mg/dL — ABNORMAL HIGH (ref 70–99)
Glucose-Capillary: 463 mg/dL — ABNORMAL HIGH (ref 70–99)

## 2018-08-25 LAB — PROTIME-INR
INR: 1.54
INR: 1.55
PROTHROMBIN TIME: 18.3 s — AB (ref 11.4–15.2)
PROTHROMBIN TIME: 18.4 s — AB (ref 11.4–15.2)

## 2018-08-25 LAB — POCT I-STAT 3, VENOUS BLOOD GAS (G3P V)
Acid-Base Excess: 2 mmol/L (ref 0.0–2.0)
Bicarbonate: 27.6 mmol/L (ref 20.0–28.0)
O2 Saturation: 63 %
PCO2 VEN: 45.3 mmHg (ref 44.0–60.0)
PH VEN: 7.392 (ref 7.250–7.430)
TCO2: 29 mmol/L (ref 22–32)
pO2, Ven: 33 mmHg (ref 32.0–45.0)

## 2018-08-25 LAB — POCT ACTIVATED CLOTTING TIME
ACTIVATED CLOTTING TIME: 252 s
ACTIVATED CLOTTING TIME: 257 s

## 2018-08-25 LAB — HEPARIN LEVEL (UNFRACTIONATED)
Heparin Unfractionated: 0.29 IU/mL — ABNORMAL LOW (ref 0.30–0.70)
Heparin Unfractionated: 0.66 IU/mL (ref 0.30–0.70)

## 2018-08-25 SURGERY — CORONARY STENT INTERVENTION
Anesthesia: LOCAL

## 2018-08-25 MED ORDER — HEPARIN (PORCINE) IN NACL 100-0.45 UNIT/ML-% IJ SOLN
1150.0000 [IU]/h | INTRAMUSCULAR | Status: DC
  Administered 2018-08-25: 1150 [IU]/h via INTRAVENOUS
  Filled 2018-08-25: qty 250

## 2018-08-25 MED ORDER — HEPARIN (PORCINE) IN NACL 1000-0.9 UT/500ML-% IV SOLN
INTRAVENOUS | Status: AC
  Filled 2018-08-25: qty 500

## 2018-08-25 MED ORDER — SODIUM CHLORIDE 0.9% FLUSH: 3.0000 mL | Freq: Two times a day (BID) | INTRAVENOUS | Status: DC

## 2018-08-25 MED ORDER — MIDAZOLAM HCL 2 MG/2ML IJ SOLN
INTRAMUSCULAR | Status: AC
  Filled 2018-08-25: qty 2

## 2018-08-25 MED ORDER — MIDAZOLAM HCL 2 MG/2ML IJ SOLN
INTRAMUSCULAR | Status: DC | PRN
  Administered 2018-08-25: 0.5 mg via INTRAVENOUS

## 2018-08-25 MED ORDER — FAMOTIDINE IN NACL 20-0.9 MG/50ML-% IV SOLN
INTRAVENOUS | Status: AC
  Filled 2018-08-25: qty 50

## 2018-08-25 MED ORDER — FENTANYL CITRATE (PF) 100 MCG/2ML IJ SOLN
INTRAMUSCULAR | Status: DC | PRN
  Administered 2018-08-25: 12.5 ug via INTRAVENOUS

## 2018-08-25 MED ORDER — ANGIOPLASTY BOOK
Freq: Once | Status: AC
  Administered 2018-08-25: 22:00:00
  Filled 2018-08-25: qty 1

## 2018-08-25 MED ORDER — HEART ATTACK BOUNCING BOOK
Freq: Once | Status: AC
  Administered 2018-08-25: 22:00:00
  Filled 2018-08-25: qty 1

## 2018-08-25 MED ORDER — CLOPIDOGREL BISULFATE 300 MG PO TABS
ORAL_TABLET | ORAL | Status: AC
  Filled 2018-08-25: qty 2

## 2018-08-25 MED ORDER — LIDOCAINE HCL (PF) 1 % IJ SOLN
INTRAMUSCULAR | Status: AC
  Filled 2018-08-25: qty 30

## 2018-08-25 MED ORDER — VERAPAMIL HCL 2.5 MG/ML IV SOLN
INTRAVENOUS | Status: AC
  Filled 2018-08-25: qty 2

## 2018-08-25 MED ORDER — CLOPIDOGREL BISULFATE 75 MG PO TABS
75.0000 mg | ORAL_TABLET | Freq: Every day | ORAL | Status: DC
  Administered 2018-08-26: 11:00:00 75 mg via ORAL
  Filled 2018-08-25: qty 1

## 2018-08-25 MED ORDER — IOHEXOL 350 MG/ML SOLN
INTRAVENOUS | Status: DC | PRN
  Administered 2018-08-25: 90 mL via INTRAVENOUS

## 2018-08-25 MED ORDER — SODIUM CHLORIDE 0.9% FLUSH
3.0000 mL | INTRAVENOUS | Status: DC | PRN
  Administered 2018-08-26: 3 mL via INTRAVENOUS
  Filled 2018-08-25: qty 3

## 2018-08-25 MED ORDER — HYDRALAZINE HCL 20 MG/ML IJ SOLN: 5.0000 mg | INTRAMUSCULAR | Status: AC | PRN

## 2018-08-25 MED ORDER — THE SENSUOUS HEART BOOK
Freq: Once | Status: AC
  Administered 2018-08-25: 22:00:00
  Filled 2018-08-25: qty 1

## 2018-08-25 MED ORDER — HEPARIN SODIUM (PORCINE) 1000 UNIT/ML IJ SOLN
INTRAMUSCULAR | Status: DC | PRN
  Administered 2018-08-25: 2000 [IU] via INTRAVENOUS
  Administered 2018-08-25 (×2): 4000 [IU] via INTRAVENOUS

## 2018-08-25 MED ORDER — VERAPAMIL HCL 2.5 MG/ML IV SOLN
INTRAVENOUS | Status: DC | PRN
  Administered 2018-08-25: 10 mL via INTRA_ARTERIAL

## 2018-08-25 MED ORDER — SODIUM CHLORIDE 0.9 % IV SOLN
250.0000 mL | INTRAVENOUS | Status: DC | PRN
  Administered 2018-08-25: 250 mL via INTRAVENOUS

## 2018-08-25 MED ORDER — INSULIN ASPART 100 UNIT/ML ~~LOC~~ SOLN
0.0000 [IU] | Freq: Three times a day (TID) | SUBCUTANEOUS | Status: DC
  Administered 2018-08-25: 22:00:00 7 [IU] via SUBCUTANEOUS
  Administered 2018-08-26: 5 [IU] via SUBCUTANEOUS
  Administered 2018-08-26: 7 [IU] via SUBCUTANEOUS
  Administered 2018-08-26: 07:00:00 3 [IU] via SUBCUTANEOUS

## 2018-08-25 MED ORDER — NITROGLYCERIN 1 MG/10 ML FOR IR/CATH LAB
INTRA_ARTERIAL | Status: AC
  Filled 2018-08-25: qty 10

## 2018-08-25 MED ORDER — CLOPIDOGREL BISULFATE 300 MG PO TABS
ORAL_TABLET | ORAL | Status: DC | PRN
  Administered 2018-08-25: 600 mg via ORAL

## 2018-08-25 MED ORDER — ALUM & MAG HYDROXIDE-SIMETH 200-200-20 MG/5ML PO SUSP
30.0000 mL | Freq: Once | ORAL | Status: AC
  Administered 2018-08-25: 18:00:00 30 mL via ORAL
  Filled 2018-08-25: qty 30

## 2018-08-25 MED ORDER — HEPARIN (PORCINE) IN NACL 1000-0.9 UT/500ML-% IV SOLN
INTRAVENOUS | Status: DC | PRN
  Administered 2018-08-25 (×3): 500 mL

## 2018-08-25 MED ORDER — WARFARIN SODIUM 6 MG PO TABS
6.0000 mg | ORAL_TABLET | Freq: Once | ORAL | Status: AC
  Administered 2018-08-25: 19:00:00 6 mg via ORAL
  Filled 2018-08-25: qty 1

## 2018-08-25 MED ORDER — LIDOCAINE HCL (PF) 1 % IJ SOLN
INTRAMUSCULAR | Status: DC | PRN
  Administered 2018-08-25: 2 mL via INTRADERMAL

## 2018-08-25 MED ORDER — FENTANYL CITRATE (PF) 100 MCG/2ML IJ SOLN
INTRAMUSCULAR | Status: AC
  Filled 2018-08-25: qty 2

## 2018-08-25 MED ORDER — WARFARIN - PHARMACIST DOSING INPATIENT
Freq: Every day | Status: DC
  Administered 2018-08-25 – 2018-08-26 (×2)

## 2018-08-25 MED ORDER — LABETALOL HCL 5 MG/ML IV SOLN: 10.0000 mg | INTRAVENOUS | Status: AC | PRN

## 2018-08-25 MED ORDER — FAMOTIDINE IN NACL 20-0.9 MG/50ML-% IV SOLN
INTRAVENOUS | Status: AC | PRN
  Administered 2018-08-25: 20 mg via INTRAVENOUS

## 2018-08-25 SURGICAL SUPPLY — 21 items
BALLN EMERGE MR 2.25X12 (BALLOONS) ×2
BALLN SAPPHIRE ~~LOC~~ 2.75X12 (BALLOONS) ×2 IMPLANT
BALLOON EMERGE MR 2.25X12 (BALLOONS) ×1 IMPLANT
CATH 5FR JL3.5 JR4 ANG PIG MP (CATHETERS) ×2 IMPLANT
CATH BALLN WEDGE 5F 110CM (CATHETERS) ×2 IMPLANT
CATH VISTA GUIDE 6FR XBLAD3.0 (CATHETERS) ×2 IMPLANT
DEVICE RAD COMP TR BAND LRG (VASCULAR PRODUCTS) ×2 IMPLANT
GLIDESHEATH SLEND A-KIT 6F 22G (SHEATH) ×2 IMPLANT
GLIDEWIRE NITREX 0.018X80X5 (WIRE) ×1
GUIDEWIRE .025 260CM (WIRE) ×2 IMPLANT
GUIDEWIRE INQWIRE 1.5J.035X260 (WIRE) ×1 IMPLANT
GUIDEWIRE NITREX 0.018X80X5 (WIRE) ×1 IMPLANT
INQWIRE 1.5J .035X260CM (WIRE) ×2
KIT ENCORE 26 ADVANTAGE (KITS) ×2 IMPLANT
KIT HEART LEFT (KITS) ×2 IMPLANT
PACK CARDIAC CATHETERIZATION (CUSTOM PROCEDURE TRAY) ×2 IMPLANT
SHEATH GLIDE SLENDER 4/5FR (SHEATH) ×2 IMPLANT
STENT SYNERGY DES 2.5X16 (Permanent Stent) ×2 IMPLANT
TRANSDUCER W/STOPCOCK (MISCELLANEOUS) ×2 IMPLANT
TUBING CIL FLEX 10 FLL-RA (TUBING) ×2 IMPLANT
WIRE RUNTHROUGH .014X180CM (WIRE) ×2 IMPLANT

## 2018-08-25 NOTE — Progress Notes (Signed)
[place] for heparin Indication: ACS/STEMI, LV thrombus  Allergies  Allergen Reactions  . Pork-Derived Products Other (See Comments)    Does not eat for religious reasons - okay with using IV heparin    Patient Measurements: Height: 5' 6.5" (168.9 cm) Weight: 176 lb 14.4 oz (80.2 kg) IBW/kg (Calculated) : 64.95 Heparin Dosing Weight: 80 kg  Vital Signs: Temp: 98.5 F (36.9 C) (10/28 0025) Temp Source: Oral (10/28 0025) BP: 92/72 (10/28 0025) Pulse Rate: 72 (10/28 0025)  Labs: Recent Labs    08/22/18 0255 08/22/18 0610 08/22/18 1227 08/23/18 0501 08/24/18 0445 08/24/18 1511 08/24/18 2323  HGB  --  12.6*  --   --   --   --  13.2  HCT  --  40.3  --   --   --   --  40.9  PLT  --  264  --   --   --   --  269  LABPROT  --   --   --  26.3* 21.5*  --   --   INR  --   --   --  2.46 1.90  --   --   HEPARINUNFRC  --   --   --   --   --  0.25* 0.29*  CREATININE  --  1.12  --   --   --   --   --   TROPONINI 1.22* 2.32* 2.05*  --   --   --   --     Estimated Creatinine Clearance: 68.8 mL/min (by C-G formula based on SCr of 1.12 mg/dL).  Assessment: 61 y.o. male with h/o LV thrombus, Coumadin on hold hold while awaiting cath, for heparin  Goal of Therapy:  Heparin level 0.3-0.7 units/ml Monitor platelets by anticoagulation protocol: Yes   Plan:  Increase Heparin 1200 units/hr  Phillis Knack, PharmD, BCPS  08/25/2018 1:01 AM

## 2018-08-25 NOTE — Brief Op Note (Signed)
BRIEF CARDIAC CATHETERIZATION NOTE  DATE: 08/25/2018 TIME: 4:42 PM  PATIENT:  Daniel Li  62 y.o. male  PRE-OPERATIVE DIAGNOSIS:  NSTEMI  POST-OPERATIVE DIAGNOSIS:  NSTEMI  PROCEDURE:  Procedure(s): LEFT HEART CATH AND CORONARY ANGIOGRAPHY (N/A) CORONARY STENT INTERVENTION (N/A)  SURGEON:  Surgeon(s) and Role:    * Pearlene Teat, Harrell Gave, MD - Primary  FINDINGS: 1. Severe 3-vessel coronary artery disease, including 80% mid LAD stenosis, CTO of mid LCx, and 70-90% rPDA stenosis. 2. Normal left and right heart filling pressures. 3. Low normal Fick cardiac output/index. 4. Successful PCI to mid LAD using Synergy 2.5 x 16 mm DES with 0% residual stenosis and TIMI-3 flow.  RECOMMENDATIONS: 1. DAPT with ASA and clopidogrel until INR is therapeutic, at which time ASA can be stopped.  Continue at least 12 months of warfarin and clopidogrel. 2. Aggressive secondary prevention and evidence-based HF therapy. 3. Restart warfarin tonight; restart heparin infusion 2 hours after TR band removal.  Nelva Bush, MD [place] Pager: [phone]

## 2018-08-25 NOTE — Interval H&P Note (Signed)
History and Physical Interval Note:  08/25/2018 2:54 PM  Daniel Li  has presented today for cardiac catheterization, with the diagnosis of NSTEMI. The various methods of treatment have been discussed with the patient and family. After consideration of risks, benefits and other options for treatment, the patient has consented to  Procedure(s): LEFT HEART CATH AND CORONARY ANGIOGRAPHY (N/A) as a surgical intervention .  The patient's history has been reviewed, patient examined, no change in status, stable for surgery.  I have reviewed the patient's chart and labs.  Questions were answered to the patient's satisfaction.    Cath Lab Visit (complete for each Cath Lab visit)  Clinical Evaluation Leading to the Procedure:   ACS: Yes.    Non-ACS:  N/A  [name]

## 2018-08-25 NOTE — Progress Notes (Signed)
TR BAND REMOVAL  LOCATION:    right radial  DEFLATED PER PROTOCOL:    Yes.    TIME BAND OFF / DRESSING APPLIED:    2030   SITE UPON ARRIVAL:    Level 0  SITE AFTER BAND REMOVAL:    Level 0  CIRCULATION SENSATION AND MOVEMENT:    Within Normal Limits   Yes.    COMMENTS:   Post TR Band instructions given, DSD applied, good cap refill

## 2018-08-25 NOTE — Progress Notes (Signed)
Inpatient Diabetes Program Recommendations  AACE/ADA: New Consensus Statement on Inpatient Glycemic Control (2015)  Target Ranges:  Prepandial:   less than 140 mg/dL      Peak postprandial:   less than 180 mg/dL (1-2 hours)      Critically ill patients:  140 - 180 mg/dL   Lab Results  Component Value Date   GLUCAP 138 (H) 08/25/2018   HGBA1C 7.7 (H) 05/24/2018    Review of Glycemic ControlResults for Daniel Li, Daniel Li (MRN [id number]) as of 08/25/2018 11:00  Ref. Range 08/24/2018 17:10 08/24/2018 19:58 08/25/2018 00:22 08/25/2018 04:03 08/25/2018 07:47  Glucose-Capillary Latest Ref Range: 70 - 99 mg/dL 427 (H) 425 (H) 182 (H) 127 (H) 138 (H)    Diabetes history: DM2 Outpatient Diabetes medications:  Glucotrol XL 10 mg daily Current orders for Inpatient glycemic control:  Novolog sensitive q 4 hours Inpatient Diabetes Program Recommendations:   Please add A1C to current labs.  Blood sugars have been up and down.  May consider adding Levemir 8 units bid.  Once eating , he will likely also need Novolog meal coverage as well.   Thanks,  Adah Perl, RN, BC-ADM Inpatient Diabetes Coordinator Pager [phone] (8a-5p)

## 2018-08-25 NOTE — Progress Notes (Signed)
Progress Note  Patient Name: Daniel Li Date of Encounter: 08/25/2018  Primary Cardiologist: Carlyle Dolly, MD   Subjective   No chest pain and no SOB , concern with ventral hernia.   Inpatient Medications    Scheduled Meds: . aspirin EC  81 mg Oral Daily  . carvedilol  6.25 mg Oral BID WC  . digoxin  0.125 mg Oral Daily  . ezetimibe  10 mg Oral Daily  . fenofibrate  160 mg Oral Daily  . gabapentin  100 mg Oral BID  . insulin aspart  0-9 Units Subcutaneous Q4H  . losartan  12.5 mg Oral Daily  . nicotine  21 mg Transdermal Daily  . pantoprazole  40 mg Oral BID  . potassium chloride SA  20 mEq Oral Daily  . sodium chloride flush  3 mL Intravenous Q12H  . sodium chloride flush  3 mL Intravenous Q12H  . spironolactone  25 mg Oral QHS  . torsemide  20 mg Oral Daily   Continuous Infusions: . sodium chloride    . sodium chloride    . sodium chloride 1 mL/kg/hr (08/25/18 0513)  . heparin 1,200 Units/hr (08/25/18 0728)   PRN Meds: sodium chloride, sodium chloride, acetaminophen **OR** acetaminophen, meclizine, morphine injection, nitroGLYCERIN, sodium chloride flush, sodium chloride flush   Vital Signs    Vitals:   08/24/18 2001 08/25/18 0025 08/25/18 0408 08/25/18 0753  BP: 98/82 92/72 90/62  92/75  Pulse: 80 72 66 72  Resp:   18 18  Temp: 98.4 F (36.9 C) 98.5 F (36.9 C) 98.4 F (36.9 C) 98.4 F (36.9 C)  TempSrc: Oral Oral Oral Oral  SpO2: 96% 93% 91% 100%  Weight:   80.3 kg   Height:        Intake/Output Summary (Last 24 hours) at 08/25/2018 0802 Last data filed at 08/25/2018 0600 Gross per 24 hour  Intake 1668.08 ml  Output 300 ml  Net 1368.08 ml   Filed Weights   08/23/18 0412 08/24/18 0424 08/25/18 0408  Weight: 80.2 kg 80.2 kg 80.3 kg    Telemetry    SR with PACs. - Personally Reviewed  ECG    No new - Personally Reviewed  Physical Exam   GEN: No acute distress.   Neck: No JVD Cardiac: RRR, no murmurs, rubs, or gallops.    Respiratory: + rhonchi to auscultation bilaterally. GI: Soft, nontender, non-distended , + ventral hernia reduces easily. MS: No edema; No deformity. Neuro:  Nonfocal  Psych: Normal affect   Labs    Chemistry Recent Labs  Lab 08/22/18 0018 08/22/18 0610  NA 132* 135  K 3.8 3.9  CL 96* 100  CO2 26 28  GLUCOSE 359* 306*  BUN 15 14  CREATININE 1.29* 1.12  CALCIUM 9.3 9.2  PROT  --  7.0  ALBUMIN  --  3.8  AST  --  30  ALT  --  18  ALKPHOS  --  53  BILITOT  --  0.4  GFRNONAA 58* >60  GFRAA >60 >60  ANIONGAP 10 7     Hematology Recent Labs  Lab 08/22/18 0610 08/24/18 2323 08/25/18 0520  WBC 8.8 6.0 6.8  RBC 5.12 5.25 5.23  HGB 12.6* 13.2 13.2  HCT 40.3 40.9 40.7  MCV 78.7* 77.9* 77.8*  MCH 24.6* 25.1* 25.2*  MCHC 31.3 32.3 32.4  RDW 14.9 14.7 14.8  PLT 264 269 260    Cardiac Enzymes Recent Labs  Lab 08/22/18 0018 08/22/18 0255 08/22/18 6269  08/22/18 1227  TROPONINI 0.16* 1.22* 2.32* 2.05*   No results for input(s): TROPIPOC in the last 168 hours.   BNPNo results for input(s): BNP, PROBNP in the last 168 hours.   DDimer No results for input(s): DDIMER in the last 168 hours.   Radiology    No results found.  Cardiac Studies   Cath pending   Patient Profile     62 y.o. male with a history of coronary artery disease-Status post BMS to D1 and OM 2 in 2007, status post BMS to RCA in May, 2013, CTO of the left circumflex artery in 2014.  Most recently was status post an anterior ST segment elevation myocardial infarction in April, 2018 with placement  of a DES to the proximal LAD.  ICM, st jude ICD.   Has a history of left ventricular  Thrombus and has been on Coumadin. Now here for NSTEMI.  pk troponin 2.32.   Assessment & Plan    NSTEMI with pk troponin 2.32, plan for cardiac cath today.  INR today 1.55 --today pain free  CAD with hx of BMS to D1 and OM2 in 2007, BMS to RCA in 02/2012, CTO of LCx by cath in 2014, and most recently anterior  STEMI in 01/2017 with DES to Proximal-LAD, stable anatomy by cath in 01/2018 including patent LAD stent, chronically occluded LCx and distal RCA  ICM with chronic systolic HF on coreg, dig, ARB, and spironolactone. And torsemide.  If I&O correct +1867  S/p st j. ICD   HLD continue zetia and tricor. Also was on Repatha and Vascepa  Hx CVAs, stable.  DM-2  With glucose 426 last evening and today 182-127 on SSI glucotrol as outpt will adjust to moderate. Post cath.  Tobacco use on Nicoderm patch  Ventral Hernia     For questions or updates, please contact [place] Please consult [email or web address] for contact info under        Signed, Cecilie Kicks, NP  08/25/2018, 8:02 AM

## 2018-08-25 NOTE — Progress Notes (Signed)
ANTICOAGULATION CONSULT NOTE - Follow Up Consult  Pharmacy Consult for Heparin Indication: CP with h/o LV thrombus  Allergies  Allergen Reactions  . Pork-Derived Products Other (See Comments)    Does not eat for religious reasons - okay with using IV heparin    Patient Measurements: Height: 5' 6.5" (168.9 cm) Weight: 177 lb 1.6 oz (80.3 kg) IBW/kg (Calculated) : 64.95 Heparin Dosing Weight: 80 kg  Vital Signs: Temp: 98.4 F (36.9 C) (10/28 1150) Temp Source: Oral (10/28 1150) BP: 95/72 (10/28 1150) Pulse Rate: 68 (10/28 1150)  Labs: Recent Labs    08/24/18 0445 08/24/18 1511 08/24/18 2323 08/25/18 0520 08/25/18 1139  HGB  --   --  13.2 13.2  --   HCT  --   --  40.9 40.7  --   PLT  --   --  269 260  --   LABPROT 21.5*  --  18.3* 18.4*  --   INR 1.90  --  1.54 1.55  --   HEPARINUNFRC  --  0.25* 0.29*  --  0.66    Estimated Creatinine Clearance: 68.8 mL/min (by C-G formula based on SCr of 1.12 mg/dL).  Assessment: Anticoag: Warf for LV thrombus pta > hep for cath. HL 0.29>0.66 now in goal. CBC WNL today. INR 1.55. - Home dose of warfarin 5 mg daily. Admit INR 3.15.  Goal of Therapy:  Heparin level 0.3-0.7 units/ml Monitor platelets by anticoagulation protocol: Yes   Plan:  Continue heparin gtt at 1100 units/hr Hold warfarin for cath Monitor INR, heparin level, CBC and s/s bleeding  Roselie Cirigliano S. Alford Highland, PharmD, BCPS Clinical Staff Pharmacist [place] 08/25/2018,12:41 PM

## 2018-08-25 NOTE — Progress Notes (Signed)
[place] for heparin + warfarin Indication: ACS/STEMI, LV thrombus  Allergies  Allergen Reactions  . Pork-Derived Products Other (See Comments)    Does not eat for religious reasons - okay with using IV heparin    Patient Measurements: Height: 5' 6.5" (168.9 cm) Weight: 177 lb 1.6 oz (80.3 kg) IBW/kg (Calculated) : 64.95 Heparin Dosing Weight: 80 kg  Vital Signs: Temp: 98.4 F (36.9 C) (10/28 1150) Temp Source: Oral (10/28 1150) BP: 104/70 (10/28 1641) Pulse Rate: 0 (10/28 1645)  Labs: Recent Labs    08/24/18 0445 08/24/18 1511 08/24/18 2323 08/25/18 0520 08/25/18 1139  HGB  --   --  13.2 13.2  --   HCT  --   --  40.9 40.7  --   PLT  --   --  269 260  --   LABPROT 21.5*  --  18.3* 18.4*  --   INR 1.90  --  1.54 1.55  --   HEPARINUNFRC  --  0.25* 0.29*  --  0.66    Estimated Creatinine Clearance: 68.8 mL/min (by C-G formula based on SCr of 1.12 mg/dL).  Assessment: 62 y.o. male with h/o LV thrombus (warfarin PTA); starting on heparin infusion while warfarin on hold.   Underwent cardiac cath finding severe 3v disease with DES to mid LAD. Hgb 13.2, plt 260. No s/sx of bleeding. Plan to resume heparin 2 hour after TR band removal (removal at 0830 on 10/28). INR today is 1.55- okay to resume warfarin (of note, INR 3.15 on 10/25- no doses since).   Per anti-coag note from 08/12/2018, on warfarin 5 mg daily.  Goal of Therapy:  Heparin level 0.3-0.7 units/ml Monitor platelets by anticoagulation protocol: Yes   Plan:  Order warfarin 6 mg tonight  Restart heparin infusion at 1150 units/hr on 10/28 at 2230 Monitor daily heparin level, CBC, and for s/sx of bleeding  Doylene Canard, PharmD Clinical Pharmacist  Pager: [phone] Phone: [phone] 08/25/2018 5:08 PM

## 2018-08-26 ENCOUNTER — Inpatient Hospital Stay (HOSPITAL_COMMUNITY): Payer: Medicaid Other

## 2018-08-26 ENCOUNTER — Telehealth: Payer: Self-pay | Admitting: Cardiology

## 2018-08-26 ENCOUNTER — Encounter (HOSPITAL_COMMUNITY): Payer: Self-pay | Admitting: Internal Medicine

## 2018-08-26 DIAGNOSIS — I361 Nonrheumatic tricuspid (valve) insufficiency: Secondary | ICD-10-CM

## 2018-08-26 DIAGNOSIS — Z955 Presence of coronary angioplasty implant and graft: Secondary | ICD-10-CM

## 2018-08-26 LAB — BASIC METABOLIC PANEL
Anion gap: 8 (ref 5–15)
BUN: 14 mg/dL (ref 8–23)
CHLORIDE: 105 mmol/L (ref 98–111)
CO2: 23 mmol/L (ref 22–32)
Calcium: 9.1 mg/dL (ref 8.9–10.3)
Creatinine, Ser: 1.29 mg/dL — ABNORMAL HIGH (ref 0.61–1.24)
GFR calc non Af Amer: 58 mL/min — ABNORMAL LOW (ref >60)
Glucose, Bld: 230 mg/dL — ABNORMAL HIGH (ref 70–99)
POTASSIUM: 4.2 mmol/L (ref 3.5–5.1)
Sodium: 136 mmol/L (ref 135–145)

## 2018-08-26 LAB — CBC
HEMATOCRIT: 39 % (ref 39.0–52.0)
HEMOGLOBIN: 12.3 g/dL — AB (ref 13.0–17.0)
MCH: 25.1 pg — AB (ref 26.0–34.0)
MCHC: 31.5 g/dL (ref 30.0–36.0)
MCV: 79.6 fL — ABNORMAL LOW (ref 80.0–100.0)
Platelets: 228 10*3/uL (ref 150–400)
RBC: 4.9 MIL/uL (ref 4.22–5.81)
RDW: 14.9 % (ref 11.5–15.5)
WBC: 6.4 10*3/uL (ref 4.0–10.5)
nRBC: 0 % (ref 0.0–0.2)

## 2018-08-26 LAB — ECHOCARDIOGRAM LIMITED
HEIGHTINCHES: 66.5 in
WEIGHTICAEL: 2857.16 [oz_av]

## 2018-08-26 LAB — GLUCOSE, CAPILLARY
GLUCOSE-CAPILLARY: 214 mg/dL — AB (ref 70–99)
GLUCOSE-CAPILLARY: 256 mg/dL — AB (ref 70–99)
Glucose-Capillary: 313 mg/dL — ABNORMAL HIGH (ref 70–99)

## 2018-08-26 LAB — PROTIME-INR
INR: 1.69
Prothrombin Time: 19.7 seconds — ABNORMAL HIGH (ref 11.4–15.2)

## 2018-08-26 LAB — HEPARIN LEVEL (UNFRACTIONATED): HEPARIN UNFRACTIONATED: 1.02 [IU]/mL — AB (ref 0.30–0.70)

## 2018-08-26 MED ORDER — ASPIRIN 81 MG PO TBEC: 81.0000 mg | DELAYED_RELEASE_TABLET | Freq: Every day | ORAL | Status: DC

## 2018-08-26 MED ORDER — HEPARIN (PORCINE) IN NACL 100-0.45 UNIT/ML-% IJ SOLN: 1000.0000 [IU]/h | INTRAMUSCULAR | Status: DC

## 2018-08-26 MED ORDER — PERFLUTREN LIPID MICROSPHERE
1.0000 mL | INTRAVENOUS | Status: AC | PRN
  Administered 2018-08-26: 11:00:00 2 mL via INTRAVENOUS
  Filled 2018-08-26: qty 10

## 2018-08-26 MED ORDER — CLOPIDOGREL BISULFATE 75 MG PO TABS: 75.0000 mg | ORAL_TABLET | Freq: Every day | ORAL | 3 refills | Status: DC

## 2018-08-26 MED ORDER — WARFARIN SODIUM 5 MG PO TABS
5.0000 mg | ORAL_TABLET | Freq: Once | ORAL | Status: AC
  Administered 2018-08-26: 18:00:00 5 mg via ORAL
  Filled 2018-08-26: qty 1

## 2018-08-26 MED FILL — Nitroglycerin IV Soln 100 MCG/ML in D5W: INTRA_ARTERIAL | Qty: 10 | Status: AC

## 2018-08-26 NOTE — Progress Notes (Signed)
Progress Note  Patient Name: Daniel Li Date of Encounter: 08/26/2018  Primary Cardiologist: Carlyle Dolly, MD HF clinic/ Dr. Aundra Dubin EP: Dr. Rayann Heman  Subjective   Denies any CP or SOB.  Inpatient Medications    Scheduled Meds: . aspirin EC  81 mg Oral Daily  . carvedilol  6.25 mg Oral BID WC  . clopidogrel  75 mg Oral Q breakfast  . digoxin  0.125 mg Oral Daily  . ezetimibe  10 mg Oral Daily  . fenofibrate  160 mg Oral Daily  . gabapentin  100 mg Oral BID  . insulin aspart  0-9 Units Subcutaneous TID AC & HS  . losartan  12.5 mg Oral Daily  . nicotine  21 mg Transdermal Daily  . pantoprazole  40 mg Oral BID  . potassium chloride SA  20 mEq Oral Daily  . sodium chloride flush  3 mL Intravenous Q12H  . sodium chloride flush  3 mL Intravenous Q12H  . spironolactone  25 mg Oral QHS  . torsemide  20 mg Oral Daily  . Warfarin - Pharmacist Dosing Inpatient   Does not apply q1800   Continuous Infusions: . sodium chloride    . sodium chloride 250 mL (08/25/18 2230)  . heparin 1,150 Units/hr (08/25/18 2229)   PRN Meds: sodium chloride, sodium chloride, acetaminophen **OR** acetaminophen, meclizine, morphine injection, nitroGLYCERIN, sodium chloride flush, sodium chloride flush   Vital Signs    Vitals:   08/25/18 1645 08/25/18 1713 08/25/18 1949 08/26/18 0307  BP:   108/68 94/71  Pulse: (!) 0  74 61  Resp: (!) 0  18 16  Temp:  98.1 F (36.7 C) 97.7 F (36.5 C) 97.6 F (36.4 C)  TempSrc:  Oral Oral Oral  SpO2: (!) 0%  97% 96%  Weight:    81 kg  Height:        Intake/Output Summary (Last 24 hours) at 08/26/2018 0748 Last data filed at 08/26/2018 7616 Gross per 24 hour  Intake 892.72 ml  Output 1650 ml  Net -757.28 ml   Filed Weights   08/24/18 0424 08/25/18 0408 08/26/18 0307  Weight: 80.2 kg 80.3 kg 81 kg    Telemetry    NSR, occasional bradycardia, no significant ventricular ectopy - Personally Reviewed  ECG    NSR with poor R wave  progression, TWI in inferolateral leads  - Personally Reviewed  Physical Exam   GEN: No acute distress.   Neck: No JVD Cardiac: RRR, no murmurs, rubs, or gallops. R radial cath stable.  Respiratory: Clear to auscultation bilaterally. GI: Soft, nontender, non-distended  MS: No edema; No deformity. Neuro:  Nonfocal  Psych: Normal affect   Labs    Chemistry Recent Labs  Lab 08/22/18 0018 08/22/18 0610 08/26/18 0554  NA 132* 135 136  K 3.8 3.9 4.2  CL 96* 100 105  CO2 26 28 23   GLUCOSE 359* 306* 230*  BUN 15 14 14   CREATININE 1.29* 1.12 1.29*  CALCIUM 9.3 9.2 9.1  PROT  --  7.0  --   ALBUMIN  --  3.8  --   AST  --  30  --   ALT  --  18  --   ALKPHOS  --  53  --   BILITOT  --  0.4  --   GFRNONAA 58* >60 58*  GFRAA >60 >60 >60  ANIONGAP 10 7 8      Hematology Recent Labs  Lab 08/24/18 2323 08/25/18 0737 08/26/18 1062  WBC 6.0 6.8 6.4  RBC 5.25 5.23 4.90  HGB 13.2 13.2 12.3*  HCT 40.9 40.7 39.0  MCV 77.9* 77.8* 79.6*  MCH 25.1* 25.2* 25.1*  MCHC 32.3 32.4 31.5  RDW 14.7 14.8 14.9  PLT 269 260 228    Cardiac Enzymes Recent Labs  Lab 08/22/18 0018 08/22/18 0255 08/22/18 0610 08/22/18 1227  TROPONINI 0.16* 1.22* 2.32* 2.05*   No results for input(s): TROPIPOC in the last 168 hours.   BNPNo results for input(s): BNP, PROBNP in the last 168 hours.   DDimer No results for input(s): DDIMER in the last 168 hours.   Radiology    No results found.  Cardiac Studies   Cath 08/25/2018 Conclusions: 1. Severe 3-vessel coronary artery disease, including 80% mid LAD stenosis, chronic total occlusion of mid LCx, and 70-80% rPDA stenosis. 2. Normal left and right heart filling pressures. 3. Low normal Fick cardiac output/index. 4. Successful PCI to mid LAD using Synergy 2.5 x 16 mm DES with 0% residual stenosis and TIMI-3 flow.  Recommendations: 1. Dual antiplatelet therapy with aspirin 81 mg daily and clopidogrel 75 mg daily until INR is therapeutic, at  which time aspirin can be stopped. Continue at least 12 months of warfarin and clopidogrel. 2. Aggressive secondary prevention and evidence-based HF therapy. 3. Restart warfarin tonight; restart heparin infusion 2 hours after TR band removal. 4. Medical therapy for CTO of LCx and severe D1 and rPDA lesions, which appear unchanged compared with prior catheterization from 01/2018.   Patient Profile     62 y.o. male with PMH of CAD (BMS to D1 and OM2 2007, BMS to RCA 5/13, CTO of LCx by cath 2014, anterior STEMI 01/2017 DES to prox LAD), chronic systolic HF (baseline EF 79-15% on echo 05/2017), ICM s/p St Jude ICD 08/2017, LV thrombus on coumadin, moderate to severe MR, HTN, HLD, DM II, CVA and OSA. Last cath 01/2018, medical therapy. Patient transferred from [facility] hospital for NSTEMI. Cath showed 80% mLAD lesion treated with DES.   Assessment & Plan    1. NSTEMI  - serial trop peaked at 2.32.   - cath 08/25/2018 DES to mLAD, chronic occluded LCx and severe D1 and rPDA lesion treated medically.   - continue ASA and plavix. Per cath report: ASA and plavix until INR is therapeutic, at which time aspirin can be stopped, continue at least 12 month of warfarin and plavix  - plan to discharge after echo today to have coumadin level followed on 10/31 as scheduled. No chest pain or SOB this morning.   2. CAD: BMS to D1 and OM2 2007, BMS to RCA 5/13, CTO of LCx by cath 2014, anterior STEMI 01/2017 DES to prox LAD  3. Chronic systolic HF: EF 05%. Euvolemic on exam.   4. ICM s/p ICD:   5. LV thrombus on coumadin: coumadin restarted after cath, discussed with clinical pharmacist, since LV thrombus was noted on echo 02/22/2017, resolved by echo 06/07/2017, it has been more than 6 month. No need to keep in the hospital for bridging. INR check on 10/31  6. MR: mild on the last echo.  7. HTN: BP borderline low. Will hold off on restart home losartan 12.5 mg daily, may reassess on followup.   8. HLD  9. DM  II   For questions or updates, please contact [place] Please consult [email or web address] for contact info under        Signed, Almyra Deforest, [place]  08/26/2018, 7:48 AM

## 2018-08-26 NOTE — Discharge Summary (Addendum)
Discharge Summary    Patient ID: Daniel Li MRN: [id number]; DOB: [birth year]  Admit date: 08/21/2018 Discharge date: 08/26/2018  Primary Care Provider: [name], NP  Primary Cardiologist: Carlyle Dolly, MD  Primary Electrophysiologist:  Thompson Grayer, MD   Discharge Diagnoses    Principal Problem:   Non-STEMI (non-ST elevated myocardial infarction) [facility]) Active Problems:   Diabetes mellitus type 2 with complications ([place])   Benign essential HTN   CAD S/P percutaneous coronary angioplasty   Hypercholesterolemia   Chronic combined systolic (congestive) and diastolic (congestive) heart failure ([place])   ICD (implantable cardioverter-defibrillator) in place   Chest pain   Elevated troponin   Status post coronary artery stent placement   Allergies Allergies  Allergen Reactions  . Pork-Derived Products Other (See Comments)    Does not eat for religious reasons - okay with using IV heparin    Diagnostic Studies/Procedures    Cath 08/25/2018 Conclusions: 1. Severe 3-vessel coronary artery disease, including 80% mid LAD stenosis, chronic total occlusion of mid LCx, and 70-80% rPDA stenosis. 2. Normal left and right heart filling pressures. 3. Low normal Fick cardiac output/index. 4. Successful PCI to mid LAD using Synergy 2.5 x 16 mm DES with 0% residual stenosis and TIMI-3 flow.  Recommendations: 1. Dual antiplatelet therapy with aspirin 81 mg daily and clopidogrel 75 mg daily until INR is therapeutic, at which time aspirin can be stopped. Continue at least 12 months of warfarin and clopidogrel. 2. Aggressive secondary prevention and evidence-based HF therapy. 3. Restart warfarin tonight; restart heparin infusion 2 hours after TR band removal. 4. Medical therapy for CTO of LCx and severe D1 and rPDA lesions, which appear unchanged compared with prior catheterization from 01/2018.    Echo 08/26/2018 LV EF: 20% -   25% Study Conclusions  - Left  ventricle: Systolic function was severely reduced. The   estimated ejection fraction was in the range of 20% to 25%.   Severe diffuse hypokinesis. There is akinesis of the   apicalanterior, inferolateral, inferior, and apical myocardium.   There is akinesis of the basal-midanteroseptal myocardium. - Mitral valve: , with elongated chords. - Right ventricle: Pacer wire or catheter noted in right ventricle. - Tricuspid valve: There was mild regurgitation.  Impressions:  - No obvious apical LV thrombus by definity contrast but there is   swirling of contrast in the apex suggesting sluggish flow with   increased risk for development of LV thrombus. _____________   History of Present Illness     Daniel Li was most recently admitted to [facility] in 04/2018 for evaluation of atypical chest pain his pain was reproducible on examination. Cyclic troponin values were flat at 0.07 and 0.08, therefore further ischemic evaluation was not pursued. He was last examined by the CHF clinic on 07/30/2018 and reported frequent episodes of dizziness along with a productive cough. He did report brief episodes of chest discomfort but denied any persistent symptoms.  Weight had been trending upwards from 179 lbs to 186 lbs in the setting of dietary noncompliance.  Was continued on Torsemide 20 mg daily and instructed to take an extra tablet as needed for weight gain.  He presented to [facility] ED on 08/21/2018 for evaluation of worsening chest pain. He reports initially having a sore throat starting 4 days ago with an associated nonproductive cough. It is unclear if this pain was mostly due to a sore throat or musculoskeletal pain along his jaws. Starting last night, he developed a firm pressure along  his entire precordial region and reports associated dyspnea and diaphoresis. Pain was worse with inspiration but present throughout and kept him awake. He reports that his pain has improved but is still present.  Denies any recent orthopnea, PND, or lower extremity edema says that weight has overall been stable on his home scales. He reports good compliance with his medication regimen.  Initial labs show WBC 10.1, Hgb 13.1, platelets 255, Na+ 132, creatinine 1.29 (close to baseline). INR 3.15. Dig Level 0.3. Initial troponin 0.16 with repeat values trending upwards to 1.22 and 2.32. Values previously peaked at 1.67 in 01/2018 at the time of his last catheterization. CXR with no active cardiopulmonary disease. EKG shows NSR, HR 73, with LVH and ST elevation along Leads V1-V2 which is similar to prior tracings in 04/2018 but ST depression along inferior and lateral leads is more pronounced.   Hospital Course     Consultants: Patient originally admitted by hospitalist service in [place] prior to transfer to [facility]  Patient was transferred from any pain Hospital to [facility] and admitted under cardiology service.  Serial troponin peaked at 2.32.  He eventually underwent cardiac catheterization on 08/17/2018 which showed severe three-vessel CAD with 80% mid LAD lesion, chronic total occlusion of mid left circumflex, 70 to 80% RPDA stenosis.  Normal left and right heart filling pressures.  He underwent successful PCI of mid LAD using synergy 2.5 x 16 mm DES.  Postprocedure, he was placed on triple therapy with aspirin, Plavix and Coumadin was restarted.  A limited echo was repeated using Definity on 08/26/2018 which shows his EF remain low at 20 to 25%, severe diffuse hypokinesis with akinesis of the apical anterior, inferior lateral, inferior, and apical myocardium, there was swirling of contrast in the apex suggesting sluggish flow with increased risk of development of LV thrombus.  Even though he is outside 6 months.  For the Coumadin for LV thrombus, however echo finding is concerning for development of LV thrombus in the future.  He is at high risk for embolic event.  Therefore after discussing  with Dr. Debara Pickett, the decision was made to keep the patient on Coumadin at this time.  Based on the catheter report, once his INR is therapeutic, aspirin can be stopped to continue Plavix and Coumadin.  Note, his blood sugar has been high in the hospital, it is strongly recommended Jardiance as outpatient for glycemic control and cardio metabolic mortality risk reduction. _____________  Discharge Vitals Blood pressure (!) 84/52, pulse 68, temperature 98.5 F (36.9 C), temperature source Oral, resp. rate 16, height 5' 6.5" (1.689 m), weight 81 kg, SpO2 95 %.  Filed Weights   08/24/18 0424 08/25/18 0408 08/26/18 0307  Weight: 80.2 kg 80.3 kg 81 kg    Labs & Radiologic Studies    CBC Recent Labs    08/25/18 0520 08/26/18 0554  WBC 6.8 6.4  HGB 13.2 12.3*  HCT 40.7 39.0  MCV 77.8* 79.6*  PLT 260 203   Basic Metabolic Panel Recent Labs    08/26/18 0554  NA 136  K 4.2  CL 105  CO2 23  GLUCOSE 230*  BUN 14  CREATININE 1.29*  CALCIUM 9.1   Liver Function Tests No results for input(s): AST, ALT, ALKPHOS, BILITOT, PROT, ALBUMIN in the last 72 hours. No results for input(s): LIPASE, AMYLASE in the last 72 hours. Cardiac Enzymes No results for input(s): CKTOTAL, CKMB, CKMBINDEX, TROPONINI in the last 72 hours. BNP Invalid input(s): POCBNP D-Dimer  No results for input(s): DDIMER in the last 72 hours. Hemoglobin A1C No results for input(s): HGBA1C in the last 72 hours. Fasting Lipid Panel No results for input(s): CHOL, HDL, LDLCALC, TRIG, CHOLHDL, LDLDIRECT in the last 72 hours. Thyroid Function Tests No results for input(s): TSH, T4TOTAL, T3FREE, THYROIDAB in the last 72 hours.  Invalid input(s): FREET3 _____________  Dg Chest 2 View  Result Date: 08/22/2018 CLINICAL DATA:  Cough, chest pain EXAM: CHEST - 2 VIEW COMPARISON:  06/16/2018 FINDINGS: Left AICD remains in place, unchanged. Mild cardiomegaly. Lungs clear. No effusions. No acute bony abnormality. IMPRESSION: No  active cardiopulmonary disease. Electronically Signed   By: Rolm Baptise M.D.   On: 08/22/2018 00:27   Disposition   Pt is being discharged home today in good condition.  Follow-up Plans & Appointments    Follow-up Information    Arnoldo Lenis, MD Follow up on 09/11/2018.   Specialty:  Cardiology Why:  @11 :40AM. Cardiology visit Contact information: [address] [place] [address] [phone]        [place] Follow up on 09/02/2018.   Specialty:  Cardiology Why:  10:20AM. Heart failure visit Contact information: [address] [id number] [place] [address] [place], Sadou, NP Follow up.   Specialty:  Nurse Practitioner Why:  followup closely with primary care provider for uncontrolled diabetes Contact information: [place] [address] [place] Follow up on 08/28/2018.   Specialty:  Cardiology Why:  1:45PM. Coumadin check Contact information: [place] [phone]         Discharge Instructions    AMB Referral to Cardiac Rehabilitation - Phase II   Complete by:  As directed    Diagnosis:   NSTEMI Coronary Stents     Amb Referral to Cardiac Rehabilitation   Complete by:  As directed    Diagnosis:   Coronary Stents NSTEMI     Diet - low sodium heart healthy   Complete by:  As directed    Discharge instructions   Complete by:  As directed    No driving for 48 hours. No lifting over 5 lbs for 1 week. No sexual activity for 1 week. Keep procedure site clean & dry. If you notice increased pain, swelling, bleeding or pus, call/return!  You may shower, but no soaking baths/hot tubs/pools for 1 week.   Increase activity slowly   Complete by:  As directed       Discharge Medications   Allergies as of 08/26/2018      Reactions   Pork-derived  Products Other (See Comments)   Does not eat for religious reasons - okay with using IV heparin      Medication List    TAKE these medications   acetaminophen 325 MG tablet Commonly known as:  TYLENOL Take 2 tablets (650 mg total) by mouth every 6 (six) hours as needed for mild pain, fever or headache.   aspirin 81 MG EC tablet Take 1 tablet (81 mg total) by mouth daily. Start taking on:  08/27/2018   carvedilol 6.25 MG tablet Commonly known as:  COREG Take 1 tablet (6.25 mg total) by mouth 2 (two) times daily with a meal.   clopidogrel 75 MG tablet Commonly known as:  PLAVIX Take 1 tablet (75 mg total) by mouth  daily with breakfast. Start taking on:  08/27/2018   digoxin 0.125 MG tablet Commonly known as:  LANOXIN Take 1 tablet (0.125 mg total) by mouth daily.   Evolocumab 140 MG/ML Soaj Inject 1 pen into the skin every 14 (fourteen) days.   ezetimibe 10 MG tablet Commonly known as:  ZETIA Take 1 tablet (10 mg total) by mouth daily.   fenofibrate 145 MG tablet Commonly known as:  TRICOR Take 1 tablet (145 mg total) by mouth daily.   gabapentin 100 MG capsule Commonly known as:  NEURONTIN Take 1 capsule (100 mg total) by mouth 2 (two) times daily.   glipiZIDE 10 MG 24 hr tablet Commonly known as:  GLUCOTROL XL Take 1 tablet (10 mg total) by mouth daily.   Icosapent Ethyl 1 g Caps Take 2 capsules (2 g total) by mouth 2 (two) times daily.   losartan 25 MG tablet Commonly known as:  COZAAR Take 0.5 tablets (12.5 mg total) by mouth daily.   meclizine 12.5 MG tablet Commonly known as:  ANTIVERT Take 1 tablet (12.5 mg total) by mouth 3 (three) times daily as needed for dizziness.   nicotine 21 mg/24hr patch Commonly known as:  NICODERM CQ - dosed in mg/24 hours Place 1 patch (21 mg total) onto the skin daily.   nitroGLYCERIN 0.4 MG SL tablet Commonly known as:  NITROSTAT PLACE ONE (1) TABLET UNDER TONGUE EVERY 5 MINUTES UP TO (3) DOSES AS NEEDED FOR CHEST  PAIN. IF NO RELIEF, CONTACT MD. What changed:  See the new instructions.   pantoprazole 40 MG tablet Commonly known as:  PROTONIX Take 1 tablet (40 mg total) by mouth 2 (two) times daily.   potassium chloride SA 20 MEQ tablet Commonly known as:  K-DUR,KLOR-CON Take 1 tablet (20 mEq total) by mouth daily.   spironolactone 25 MG tablet Commonly known as:  ALDACTONE Take 1 tablet (25 mg total) by mouth at bedtime.   torsemide 20 MG tablet Commonly known as:  DEMADEX Take 20 mg by mouth daily as needed (weight gain greater than 3lbs overnight or greater than 5lbs in one week).   torsemide 20 MG tablet Commonly known as:  DEMADEX Take 1 tablet (20 mg total) by mouth daily.   warfarin 5 MG tablet Commonly known as:  COUMADIN Take as directed. If you are unsure how to take this medication, talk to your nurse or doctor. Original instructions:  Hold coumadin tonight then decrease dose to 5mg  daily        Acute coronary syndrome (MI, NSTEMI, STEMI, etc) this admission?: Yes.     AHA/ACC Clinical Performance & Quality Measures: 1. Aspirin prescribed? - Yes 2. ADP Receptor Inhibitor (Plavix/Clopidogrel, Brilinta/Ticagrelor or Effient/Prasugrel) prescribed (includes medically managed patients)? - Yes 3. Beta Blocker prescribed? - Yes 4. High Intensity Statin (Lipitor 40-80mg  or Crestor 20-40mg ) prescribed? - No on Repatha 5. EF assessed during THIS hospitalization? - Yes 6. For EF <40%, was ACEI/ARB prescribed? - Yes 7. For EF <40%, Aldosterone Antagonist (Spironolactone or Eplerenone) prescribed? - Yes 8. Cardiac Rehab Phase II ordered (Included Medically managed Patients)? - Yes     Outstanding Labs/Studies   None  Duration of Discharge Encounter   Greater than 30 minutes including physician time.  Hilbert Corrigan, PA 08/26/2018, 4:43 PM

## 2018-08-26 NOTE — Progress Notes (Addendum)
[place] for heparin + warfarin Indication: ACS/STEMI, LV thrombus  Allergies  Allergen Reactions  . Pork-Derived Products Other (See Comments)    Does not eat for religious reasons - okay with using IV heparin    Patient Measurements: Height: 5' 6.5" (168.9 cm) Weight: 178 lb 9.2 oz (81 kg) IBW/kg (Calculated) : 64.95 Heparin Dosing Weight: 80 kg  Vital Signs: Temp: 97.6 F (36.4 C) (10/29 0307) Temp Source: Oral (10/29 0307) BP: 94/71 (10/29 0307) Pulse Rate: 61 (10/29 0307)  Labs: Recent Labs    08/24/18 2323 08/25/18 0520 08/25/18 1139 08/26/18 0554  HGB 13.2 13.2  --  12.3*  HCT 40.9 40.7  --  39.0  PLT 269 260  --  228  LABPROT 18.3* 18.4*  --  19.7*  INR 1.54 1.55  --  1.69  HEPARINUNFRC 0.29*  --  0.66 1.02*  CREATININE  --   --   --  1.29*    Estimated Creatinine Clearance: 60 mL/min (A) (by C-G formula based on SCr of 1.29 mg/dL (H)).  Assessment: 62 y.o. male with h/o LV thrombus (warfarin PTA); starting on heparin infusion while warfarin on hold for cath. Underwent cardiac cath finding severe 3v disease with DES to mid LAD. Heparin resumed s/p TR band removal along with warfarin. Per cards, planning for DAPT (bASA + Plavix) until INR over 2, then warfarin + Plavix alone.  INR 1.69, CBC stable, heparin level elevated. Pt likely to be discharged this afternoon after ECHO without bridge as LV thrombus was noted in 2018 and repeat ECHO demonstrated resolution of thrombus.  Per anti-coag note from 08/12/2018, on warfarin 5 mg daily.  Goal of Therapy:  Heparin level 0.3-0.7 units/ml Monitor platelets by anticoagulation protocol: Yes   Plan:  -Hold heparin x30 minutes then resume at 1000 units/hr -Recheck heparin level in 6 hours if still admitted -Warfarin 5mg  PO x1 tonight and continue 5mg  daily at discharge -Daily INR, heparin level, CBC if still admitted -INR follow-up already scheduled for 10/31  Arrie Senate, PharmD, BCPS Clinical Pharmacist [phone] Please check AMION for all [place] numbers 08/26/2018

## 2018-08-26 NOTE — Progress Notes (Signed)
CARDIAC REHAB PHASE I   PRE:  Rate/Rhythm: 75 SR  BP:  Supine: 97/62  Sitting:   Standing:    SaO2: 96%RA  MODE:  Ambulation: 500 ft   POST:  Rate/Rhythm: 81 SR  BP:  Supine: 106/81  Sitting:   Standing:    SaO2: 94%RA 0845-0945 Pt walked 500 ft on RA with steady gait and tolerated well. Minimal asst. No CP. MI education completed with pt who voiced understanding. Stressed importance of plavix with stent. Reviewed NTG use, MI restrictions, ex ed, watching sodium, weighing daily, and heart healthy food choices. Pt has attended CRP 2 before. Will refer to [place] again. Pt able to answer questions re when to call MD with weight gain and importance of watching salt.   Graylon Good, RN BSN  08/26/2018 9:40 AM

## 2018-08-26 NOTE — Discharge Instructions (Addendum)

## 2018-08-26 NOTE — Progress Notes (Signed)
  Echocardiogram 2D Echocardiogram limited w/ definity has been performed.  Ronetta Molla L Androw 08/26/2018, 10:58 AM

## 2018-08-26 NOTE — Telephone Encounter (Signed)
New Message   [facility] appointment made on 09/11/18 at 11:40 with Dr. Harl Bowie per Almyra Deforest

## 2018-08-26 NOTE — Progress Notes (Addendum)
Echo personally reviewed- no thrombus, but slow flow with anteroapical wall motion abnormality. ?if LVEF will improve after PCI. Would recommend continuing warfarin with ASA and Plavix for now and then stop ASA after 1 month. Follow-up with Dr. Harl Bowie to determine if warfarin can be discontinued, pending improvement in LVEF post-PCI. Appreciate diabetes program nurse notes - noted to have BG in the 200-300 range on glipizide. Would recommend adding Jardiance as outpatient for glycemic control and cardiometabolic mortality risk reduction.  Pixie Casino, MD, [facility], [place] Director of the Advanced Lipid Disorders &  Cardiovascular Risk Reduction Clinic Diplomate of the American Board of Clinical Lipidology Attending Cardiologist  Direct Dial: [phone]  Fax: [phone]  Website:  [email or web address]

## 2018-08-26 NOTE — Progress Notes (Signed)
Inpatient Diabetes Program Recommendations  AACE/ADA: New Consensus Statement on Inpatient Glycemic Control (2015)  Target Ranges:  Prepandial:   less than 140 mg/dL      Peak postprandial:   less than 180 mg/dL (1-2 hours)      Critically ill patients:  140 - 180 mg/dL   Lab Results  Component Value Date   GLUCAP 256 (H) 08/26/2018   HGBA1C 7.7 (H) 05/24/2018    Review of Glycemic Control Results for CHRISTY, FRIEDE (MRN [id number]) as of 08/26/2018 14:44  Ref. Range 08/25/2018 17:33 08/25/2018 21:55 08/26/2018 06:22 08/26/2018 12:32  Glucose-Capillary Latest Ref Range: 70 - 99 mg/dL 144 (H) 332 (H) 214 (H) 256 (H)   Diabetes history: Type 2 DM Outpatient Diabetes medications: Glipizide 10 mg QD Current orders for Inpatient glycemic control: Novolog 0-9 units TID + HS  Inpatient Diabetes Program Recommendations:    Blood glucose trending above inpatient goal of >180 mg/dL, this is specifically around meal times. No current A1C admitting Blood glucose was 359 mg/dL.   Spoke with patient regarding outpatient management of diabetes. Patient resides in assisted living and they help manage his medications.   Reviewed patient's last A1c of 7.7% last July. Explained what a A1c is and what it measures. Also reviewed goal A1c with patient, importance of good glucose control @ home, and blood sugar goals. Reviewed inpatient blood glucose trends and explained the need for future evaluation on diabetes. Reviewed vascular changes and long term comorbidites associated with poor control.   Patient used to check blood sugars, but stopped once he began staying in the facility. Encouraged to ask to CBGs when he returns. He states, "I plan to see the MD within a few weeks once I get back." Encouraged to ask MD for management plan and inquire about checking blood sugars.  Denies drinking sugary beverages and he feels his meals are balanced.  Patient pending discharge today. May want to consider  increasing oral agent- Glipizide 10 BID and following up with PCP.   Thanks, Bronson Curb, MSN, RNC-OB Diabetes Coordinator [phone] (8a-5p)

## 2018-08-28 ENCOUNTER — Ambulatory Visit (INDEPENDENT_AMBULATORY_CARE_PROVIDER_SITE_OTHER): Payer: Medicaid Other | Admitting: *Deleted

## 2018-08-28 DIAGNOSIS — I2129 ST elevation (STEMI) myocardial infarction involving other sites: Secondary | ICD-10-CM | POA: Diagnosis not present

## 2018-08-28 DIAGNOSIS — Z5181 Encounter for therapeutic drug level monitoring: Secondary | ICD-10-CM

## 2018-08-28 LAB — POCT INR: INR: 1.7 — AB (ref 2.0–3.0)

## 2018-08-28 MED ORDER — WARFARIN SODIUM 5 MG PO TABS: ORAL_TABLET | ORAL | 11 refills | Status: DC

## 2018-08-28 NOTE — Patient Instructions (Signed)
Take coumadin 1 1/2 tablets tonight then resume 1 tablet daily.   Recheck on 09/02/18 Orders sent [facility]

## 2018-08-28 NOTE — Addendum Note (Signed)
Addended by: Malen Gauze on: 08/28/2018 02:40 PM   Modules accepted: Orders

## 2018-09-02 ENCOUNTER — Telehealth (HOSPITAL_COMMUNITY): Payer: Self-pay | Admitting: Pharmacist

## 2018-09-02 ENCOUNTER — Ambulatory Visit (HOSPITAL_COMMUNITY)
Admission: RE | Admit: 2018-09-02 | Discharge: 2018-09-02 | Disposition: A | Discharge status: Home / Self Care | Payer: Medicaid Other | Source: Ambulatory Visit | Attending: Cardiology | Admitting: Cardiology

## 2018-09-02 ENCOUNTER — Encounter (HOSPITAL_COMMUNITY): Payer: Self-pay | Admitting: Cardiology

## 2018-09-02 VITALS — BP 104/88 | HR 82 | Wt 183.0 lb

## 2018-09-02 DIAGNOSIS — I5022 Chronic systolic (congestive) heart failure: Secondary | ICD-10-CM | POA: Insufficient documentation

## 2018-09-02 DIAGNOSIS — Z9861 Coronary angioplasty status: Secondary | ICD-10-CM | POA: Diagnosis not present

## 2018-09-02 DIAGNOSIS — Z7902 Long term (current) use of antithrombotics/antiplatelets: Secondary | ICD-10-CM | POA: Insufficient documentation

## 2018-09-02 DIAGNOSIS — Z8673 Personal history of transient ischemic attack (TIA), and cerebral infarction without residual deficits: Secondary | ICD-10-CM | POA: Insufficient documentation

## 2018-09-02 DIAGNOSIS — I110 Hypertensive heart disease with heart failure: Secondary | ICD-10-CM | POA: Diagnosis not present

## 2018-09-02 DIAGNOSIS — Z79899 Other long term (current) drug therapy: Secondary | ICD-10-CM | POA: Diagnosis not present

## 2018-09-02 DIAGNOSIS — Z823 Family history of stroke: Secondary | ICD-10-CM | POA: Diagnosis not present

## 2018-09-02 DIAGNOSIS — Z8249 Family history of ischemic heart disease and other diseases of the circulatory system: Secondary | ICD-10-CM | POA: Diagnosis not present

## 2018-09-02 DIAGNOSIS — Z91018 Allergy to other foods: Secondary | ICD-10-CM | POA: Diagnosis not present

## 2018-09-02 DIAGNOSIS — Z87891 Personal history of nicotine dependence: Secondary | ICD-10-CM | POA: Insufficient documentation

## 2018-09-02 DIAGNOSIS — G4733 Obstructive sleep apnea (adult) (pediatric): Secondary | ICD-10-CM | POA: Insufficient documentation

## 2018-09-02 DIAGNOSIS — I2510 Atherosclerotic heart disease of native coronary artery without angina pectoris: Secondary | ICD-10-CM | POA: Insufficient documentation

## 2018-09-02 DIAGNOSIS — E785 Hyperlipidemia, unspecified: Secondary | ICD-10-CM | POA: Diagnosis not present

## 2018-09-02 DIAGNOSIS — Z7984 Long term (current) use of oral hypoglycemic drugs: Secondary | ICD-10-CM | POA: Diagnosis not present

## 2018-09-02 DIAGNOSIS — I513 Intracardiac thrombosis, not elsewhere classified: Secondary | ICD-10-CM | POA: Insufficient documentation

## 2018-09-02 DIAGNOSIS — Z7982 Long term (current) use of aspirin: Secondary | ICD-10-CM | POA: Insufficient documentation

## 2018-09-02 DIAGNOSIS — E1151 Type 2 diabetes mellitus with diabetic peripheral angiopathy without gangrene: Secondary | ICD-10-CM | POA: Insufficient documentation

## 2018-09-02 DIAGNOSIS — I255 Ischemic cardiomyopathy: Secondary | ICD-10-CM

## 2018-09-02 DIAGNOSIS — Z7901 Long term (current) use of anticoagulants: Secondary | ICD-10-CM | POA: Insufficient documentation

## 2018-09-02 LAB — DIGOXIN LEVEL: Digoxin Level: 0.7 ng/mL — ABNORMAL LOW (ref 0.8–2.0)

## 2018-09-02 LAB — BASIC METABOLIC PANEL
ANION GAP: 11 (ref 5–15)
BUN: 15 mg/dL (ref 8–23)
CO2: 27 mmol/L (ref 22–32)
Calcium: 10 mg/dL (ref 8.9–10.3)
Chloride: 92 mmol/L — ABNORMAL LOW (ref 98–111)
Creatinine, Ser: 1.49 mg/dL — ABNORMAL HIGH (ref 0.61–1.24)
GFR calc Af Amer: 56 mL/min — ABNORMAL LOW (ref >60)
GFR calc non Af Amer: 49 mL/min — ABNORMAL LOW (ref >60)
GLUCOSE: 493 mg/dL — AB (ref 70–99)
Potassium: 4.3 mmol/L (ref 3.5–5.1)
Sodium: 130 mmol/L — ABNORMAL LOW (ref 135–145)

## 2018-09-02 MED ORDER — SACUBITRIL-VALSARTAN 24-26 MG PO TABS: 1.0000 | ORAL_TABLET | Freq: Two times a day (BID) | ORAL | 3 refills | Status: DC

## 2018-09-02 NOTE — Progress Notes (Signed)
..  EKG CRITICAL VALUE     12 lead EKG performed.  Critical value noted.  Lindley Magnus, RN notified.   Ihor Gully, CCT 09/02/2018 10:52 AM

## 2018-09-02 NOTE — Progress Notes (Signed)
Advanced Heart Failure Clinic Note  PCP: Dr Alda Ponder Primary Cardiologist: Dr. Harl Bowie HF Cardiology: Dr. Aundra Dubin   HPI: Daniel Li is a 62 y.o. male with long history of CAD s/p multiple ACS episodes, HTN, HLD, tobacco abuse, DM, OSA, CVA and systolic CHF.   He has a history of CAD with NSTEMI in 2007 with BMS placement to his D1 and OM2. Also with history of STEMI in May 2013 at which time he had a BMS placed to his RCA. NSTEMI in January of 2014, cath showed CTO of left circumflex, unable to intervene upon. EF 55-60% in September 2016. Anterior STEMI 4/18, DES to pLAD.   Admitted 8/9 - 06/11/17 with chest pain. Cath as below. No targets for PCI, LAD stent patent. EF remained low at 20-25% as below. Now with St Jude ICD.   Admitted 4/16 - 02/17/2018 with NSTEMI. Underwent LHC again with unchanged anatomy, no intervention. Discharged back to assisted living.   He was admitted again in 10/19 with NSTEMI.  LHC showed totally occluded PLV, 75% PDA stenosis, 80% mid LAD, occluded LCx.  He had DES to mLAD. Repeat echo showed EF 20-25%.   He returns today for followup of CHF and CAD.  He is doing well since recent discharge.  No further chest pain.  He denies exertional dyspnea.  Rare mild lightheadedness with standing.  He is not smoking.  No orthopnea/PND.    ECG (personally reviewed): NSR, old ASMI  Labs (8/18): K 3.7, creatinine 1.42 Labs (9/18): digoxin < 0.2 Labs (11/18): K 3.8, creatinine 1.03 Labs (12/18): K 4, creatinine 1.27, digoxin 0.2, hgb 14.2, LDL 51, HDL 49 Labs (03/31/2018): K 3.8 Creatinine 1.1 dig level 0.3  Labs (8/19): K 3.5, creatinine 0.98, BNP 88, digoxin 0.4 Labs (9/19): K 3.9 Creatinine 1.34, LDL 64, TGs 151 Labs (10/19): K 4.2, creatinine 1.29, hgb 12.3  Review of systems complete and found to be negative unless listed in HPI.    Past Medical History 1. OSA: Supposed to be using CPAP.  2. HTN 3. CAD: Long history.  - NSTEMI 2007 with BMS D1 and OM2. - STEMI  5/13 with BMS RCA - NSTEMI 1/14 with CTO LCx and PLV, unable to open LCx.  - STEMI 4/18 with LHC showing old TO LCx with collaterals, old TO PLV, 70% ostial D1, totally occluded proximal LAD with some collaterals => DES to LAD.  - LHC 8/18 with old TO LCx, old TO PLV, 60% mLAD, patent LAD stent.  - LHC 4/19 with totally occluded PLV, totally occluded LCx, patent LAD and D1 stents, 50% mid LAD.  - NSTEMI 10/19. LHC with totally occluded PLV, totally occluded LCx, patent proximal LAD and D1 stents, 80% mid LAD stenosis => DES to mid LAD.  4. Depression 5. Type II DM 6. Hyperlipidemia: History of rhabdomyolysis with statins, he is on Zetia.  7. Chronic systolic CHF: Ischemic cardiomyopathy.  Echo from 9/16 showed improvement in EF to 55-60%.  St Jude ICD.  - Echo (4/18) with EF 20-25%, dyskinetic apex, moderate-severe MR, severe LAE.  - Echo (8/18) with EF 20-25%, moderate to severe MR - Echo (4/19) with EF 20-25%  - Echo (10/19) with EF 20-25%, no LV thrombus.  - RHC (10/19): mean RA 4, PA 22/7, mean PCWP 7, CI 2.3 8. PAD: - ABIs (4/18) with R ABI of 0.84 suggestive of mild disease.  L ABI of 1.27 (Normal flow at rest) - ABIs (5/19): abnormal TBI on right.  9. LV  thrombus  Current Outpatient Medications  Medication Sig Dispense Refill  . acetaminophen (TYLENOL) 325 MG tablet Take 2 tablets (650 mg total) by mouth every 6 (six) hours as needed for mild pain, fever or headache. 40 tablet 0  . aspirin EC 81 MG EC tablet Take 1 tablet (81 mg total) by mouth daily.    . carvedilol (COREG) 6.25 MG tablet Take 1 tablet (6.25 mg total) by mouth 2 (two) times daily with a meal. 60 tablet 6  . clopidogrel (PLAVIX) 75 MG tablet Take 1 tablet (75 mg total) by mouth daily with breakfast. 90 tablet 3  . digoxin (LANOXIN) 0.125 MG tablet Take 1 tablet (0.125 mg total) by mouth daily. 30 tablet 3  . ezetimibe (ZETIA) 10 MG tablet Take 1 tablet (10 mg total) by mouth daily. 30 tablet 3  . fenofibrate  (TRICOR) 145 MG tablet Take 1 tablet (145 mg total) by mouth daily. 30 tablet 6  . glipiZIDE (GLUCOTROL XL) 10 MG 24 hr tablet Take 1 tablet (10 mg total) by mouth daily. 30 tablet 1  . meclizine (ANTIVERT) 12.5 MG tablet Take 1 tablet (12.5 mg total) by mouth 3 (three) times daily as needed for dizziness. 30 tablet 0  . nicotine (NICODERM CQ - DOSED IN MG/24 HOURS) 21 mg/24hr patch Place 1 patch (21 mg total) onto the skin daily. 28 patch 0  . nitroGLYCERIN (NITROSTAT) 0.4 MG SL tablet PLACE ONE (1) TABLET UNDER TONGUE EVERY 5 MINUTES UP TO (3) DOSES AS NEEDED FOR CHEST PAIN. IF NO RELIEF, CONTACT MD. (Patient taking differently: Place 0.4 mg under the tongue every 5 (five) minutes as needed for chest pain. ) 25 tablet 3  . pantoprazole (PROTONIX) 40 MG tablet Take 1 tablet (40 mg total) by mouth 2 (two) times daily. 60 tablet 1  . potassium chloride SA (K-DUR,KLOR-CON) 20 MEQ tablet Take 1 tablet (20 mEq total) by mouth daily. 30 tablet 3  . spironolactone (ALDACTONE) 25 MG tablet Take 1 tablet (25 mg total) by mouth at bedtime. 30 tablet 3  . warfarin (COUMADIN) 5 MG tablet Take coumadin 7.5mg  tonight then resume 5mg  daily 45 tablet 11  . Evolocumab (REPATHA SURECLICK) 009 MG/ML SOAJ Inject 1 pen into the skin every 14 (fourteen) days. 2 pen 3  . sacubitril-valsartan (ENTRESTO) 24-26 MG Take 1 tablet by mouth 2 (two) times daily. 60 tablet 3   No current facility-administered medications for this encounter.     Allergies  Allergen Reactions  . Pork-Derived Products Other (See Comments)    Does not eat for religious reasons - okay with using IV heparin      Social History   Socioeconomic History  . Marital status: Single    Spouse name: Not on file  . Number of children: 1  . Years of education: Not on file  . Highest education level: Not on file  Occupational History  . Not on file  Social Needs  . Financial resource strain: Not on file  . Food insecurity:    Worry: Not on file      Inability: Not on file  . Transportation needs:    Medical: Not on file    Non-medical: Not on file  Tobacco Use  . Smoking status: Former Smoker    Packs/day: 0.50    Years: 20.00    Pack years: 10.00    Types: Cigarettes, Cigars    Start date: 07/21/1977    Last attempt to quit: 10/15/2017  Years since quitting: 0.8  . Smokeless tobacco: Never Used  Substance and Sexual Activity  . Alcohol use: Yes    Comment: 09/17/2017 "nothing since 05/2017"  . Drug use: No    Comment: previously incarcerated for drug related offense.  [name] Kitchen Sexual activity: Not Currently  Lifestyle  . Physical activity:    Days per week: Not on file    Minutes per session: Not on file  . Stress: Not on file  Relationships  . Social connections:    Talks on phone: Not on file    Gets together: Not on file    Attends religious service: Not on file    Active member of club or organization: Not on file    Attends meetings of clubs or organizations: Not on file    Relationship status: Not on file  . Intimate partner violence:    Fear of current or ex partner: Not on file    Emotionally abused: Not on file    Physically abused: Not on file    Forced sexual activity: Not on file  Other Topics Concern  . Not on file  Social History Narrative  . Not on file      Family History  Problem Relation Age of Onset  . Stroke Mother   . Heart attack Mother   . Heart attack Father   . Stroke Sister   . Heart attack Sister   . Heart attack Brother   . Stroke Brother   . Liver disease Neg Hx   . Colon cancer Neg Hx    Vitals:   09/02/18 1022  BP: 104/88  Pulse: 82  SpO2: 94%  Weight: 83 kg (183 lb)   Wt Readings from Last 3 Encounters:  09/02/18 83 kg (183 lb)  08/26/18 81 kg (178 lb 9.2 oz)  07/30/18 83.4 kg (183 lb 12.8 oz)   PHYSICAL EXAM: General: NAD Neck: No JVD, no thyromegaly or thyroid nodule.  Lungs: Clear to auscultation bilaterally with normal respiratory effort. CV: Nondisplaced  PMI.  Heart regular S1/S2, no S3/S4, no murmur.  No peripheral edema.  No carotid bruit.  Normal pedal pulses.  Abdomen: Soft, nontender, no hepatosplenomegaly, no distention.  Skin: Intact without lesions or rashes.  Neurologic: Alert and oriented x 3.  Psych: Normal affect. Extremities: No clubbing or cyanosis.  HEENT: Normal.   ASSESSMENT & PLAN:  1. CAD:  Extensive prior history of CAD. He has known total occlusion LCx and PLV.  NSTEMI 10/19 with DES to mid LAD.  No further chest pain.  - Continue coumadin with history of LV thrombus.   - Continue Plavix with recent stent.  He can stop ASA today.   - No statin given history of rhabdomyolysis on statin. He is now on Repatha and Zetia - I will arrange for cardiac rehab at [facility].  2. Chronic systolic URK:YHCWCBJS cardiomyopathy, St Jude ICD. Echo in 10/19 with EF 20-25%.  NYHA class II symptoms, he is not volume overloaded.  - Continue Coreg 6.25 mg bid.  - He is not taking torsemide currently, do not think he needs it.  - Stop losartan, start Entresto 24/26 bid. BMET today and again in 10 days.  - Continue spironolactone 25 mg daily.  - Continue digoxin 0.125, check level today.   3. Diabetes: He is on glipizide, would recommend switching to dapagliflozin or empagliflozin.  4. Smoking: He has quit smoking.  5. PAD: No claudication, foot ulcers.   6. LV thrombus:  On coumadin as above. No bleeding problems.  7. 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  - On warfarin for LV thrombus.   Followup in 1 month with APP.  [name], MD 09/02/18

## 2018-09-02 NOTE — Patient Instructions (Signed)
Labs done today  STOP taking losartan  START Entresto 24/26 Twice daily  Labs need to be done in 10-14 days. We have given you a prescription sheet for this. Your labs can be done at [place] or [employer] have been referred to Cardiac Rehab at [facility]. They will call you to schedule your appointment  Follow up in 1 month with our advance practice providers

## 2018-09-02 NOTE — Telephone Encounter (Signed)
Entresto PA approved by [place] Medicaid through 09/02/19.   Ruta Hinds. Velva Harman, PharmD, BCPS, CPP Clinical Pharmacist Phone: [phone] 09/02/2018 3:50 PM

## 2018-09-04 ENCOUNTER — Ambulatory Visit (INDEPENDENT_AMBULATORY_CARE_PROVIDER_SITE_OTHER): Payer: Medicaid Other | Admitting: *Deleted

## 2018-09-04 DIAGNOSIS — I2129 ST elevation (STEMI) myocardial infarction involving other sites: Secondary | ICD-10-CM

## 2018-09-04 DIAGNOSIS — Z5181 Encounter for therapeutic drug level monitoring: Secondary | ICD-10-CM | POA: Diagnosis not present

## 2018-09-04 LAB — POCT INR: INR: 2.6 (ref 2.0–3.0)

## 2018-09-04 NOTE — Patient Instructions (Signed)
Continue coumadin 1 tablet daily.   Stop Baby ASA Recheck on 09/23/18 Orders sent [facility]

## 2018-09-11 ENCOUNTER — Encounter: Payer: Self-pay | Admitting: Cardiology

## 2018-09-11 ENCOUNTER — Ambulatory Visit (INDEPENDENT_AMBULATORY_CARE_PROVIDER_SITE_OTHER): Payer: Medicaid Other | Admitting: Cardiology

## 2018-09-11 VITALS — BP 100/56 | HR 80 | Ht 66.0 in | Wt 181.0 lb

## 2018-09-11 DIAGNOSIS — E782 Mixed hyperlipidemia: Secondary | ICD-10-CM

## 2018-09-11 DIAGNOSIS — I2129 ST elevation (STEMI) myocardial infarction involving other sites: Secondary | ICD-10-CM

## 2018-09-11 DIAGNOSIS — I5022 Chronic systolic (congestive) heart failure: Secondary | ICD-10-CM | POA: Diagnosis not present

## 2018-09-11 DIAGNOSIS — I10 Essential (primary) hypertension: Secondary | ICD-10-CM

## 2018-09-11 DIAGNOSIS — I2510 Atherosclerotic heart disease of native coronary artery without angina pectoris: Secondary | ICD-10-CM
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
## 2018-09-11 NOTE — Patient Instructions (Signed)
Medication Instructions:  STOP Losartan If you need a refill on your cardiac medications before your next appointment, please call your pharmacy.   Lab work: None If you have labs (blood work) drawn today and your tests are completely normal, you will receive your results only by: [name] Kitchen MyChart Message (if you have MyChart) OR . A paper copy in the mail If you have any lab test that is abnormal or we need to change your treatment, we will call you to review the results.  Testing/Procedures: None  Follow-Up: At [facility], you and your health needs are our priority.  As part of our continuing mission to provide you with exceptional heart care, we have created designated Provider Care Teams.  These Care Teams include your primary Cardiologist (physician) and Advanced Practice Providers (APPs -  Physician Assistants and Nurse Practitioners) who all work together to provide you with the care you need, when you need it. You will need a follow up appointment in [place] may see Carlyle Dolly, MD or one of the following Advanced Practice Providers on your designated Care Team:   Bernerd Pho, PA-C [facility]) . Ermalinda Barrios, PA-C ([place])  Any Other Special Instructions Will Be Listed Below (If Applicable). None

## 2018-09-14 ENCOUNTER — Encounter: Payer: Self-pay | Admitting: Cardiology

## 2018-09-15 ENCOUNTER — Telehealth: Payer: Self-pay

## 2018-09-15 NOTE — Telephone Encounter (Signed)
Confirmed remote transmission w/ pt caretaker.

## 2018-09-17 ENCOUNTER — Ambulatory Visit (HOSPITAL_COMMUNITY)
Admission: RE | Admit: 2018-09-17 | Discharge: 2018-09-17 | Disposition: A | Discharge status: Home / Self Care | Payer: Medicaid Other | Source: Ambulatory Visit | Attending: Cardiology | Admitting: Cardiology

## 2018-09-17 DIAGNOSIS — I255 Ischemic cardiomyopathy: Secondary | ICD-10-CM

## 2018-09-18 NOTE — Progress Notes (Signed)
No ICM remote transmission received for 09/15/2018 and next ICM transmission scheduled for 10/16/2018.

## 2018-09-20 ENCOUNTER — Emergency Department (HOSPITAL_COMMUNITY): Payer: Medicaid Other

## 2018-09-20 ENCOUNTER — Encounter (HOSPITAL_COMMUNITY): Payer: Self-pay | Admitting: Emergency Medicine

## 2018-09-20 ENCOUNTER — Other Ambulatory Visit: Payer: Self-pay

## 2018-09-20 ENCOUNTER — Emergency Department (HOSPITAL_COMMUNITY)
Admission: EM | Admit: 2018-09-20 | Discharge: 2018-09-20 | Disposition: A | Discharge status: Home / Self Care | Payer: Medicaid Other | Attending: Emergency Medicine | Admitting: Emergency Medicine

## 2018-09-20 DIAGNOSIS — R079 Chest pain, unspecified: Secondary | ICD-10-CM | POA: Diagnosis present

## 2018-09-20 DIAGNOSIS — Z87891 Personal history of nicotine dependence: Secondary | ICD-10-CM | POA: Insufficient documentation

## 2018-09-20 DIAGNOSIS — Z7902 Long term (current) use of antithrombotics/antiplatelets: Secondary | ICD-10-CM | POA: Insufficient documentation

## 2018-09-20 DIAGNOSIS — R05 Cough: Secondary | ICD-10-CM | POA: Insufficient documentation

## 2018-09-20 DIAGNOSIS — Z7901 Long term (current) use of anticoagulants: Secondary | ICD-10-CM | POA: Diagnosis not present

## 2018-09-20 DIAGNOSIS — E119 Type 2 diabetes mellitus without complications: Secondary | ICD-10-CM | POA: Insufficient documentation

## 2018-09-20 DIAGNOSIS — R0789 Other chest pain: Secondary | ICD-10-CM | POA: Insufficient documentation

## 2018-09-20 DIAGNOSIS — Z79899 Other long term (current) drug therapy: Secondary | ICD-10-CM | POA: Diagnosis not present

## 2018-09-20 DIAGNOSIS — I5042 Chronic combined systolic (congestive) and diastolic (congestive) heart failure: Secondary | ICD-10-CM | POA: Diagnosis not present

## 2018-09-20 DIAGNOSIS — Z7984 Long term (current) use of oral hypoglycemic drugs: Secondary | ICD-10-CM | POA: Insufficient documentation

## 2018-09-20 DIAGNOSIS — R059 Cough, unspecified: Secondary | ICD-10-CM

## 2018-09-20 DIAGNOSIS — I110 Hypertensive heart disease with heart failure: Secondary | ICD-10-CM | POA: Diagnosis not present

## 2018-09-20 LAB — BASIC METABOLIC PANEL
Anion gap: 8 (ref 5–15)
BUN: 16 mg/dL (ref 8–23)
CHLORIDE: 98 mmol/L (ref 98–111)
CO2: 27 mmol/L (ref 22–32)
CREATININE: 1.46 mg/dL — AB (ref 0.61–1.24)
Calcium: 9 mg/dL (ref 8.9–10.3)
GFR calc Af Amer: 58 mL/min — ABNORMAL LOW (ref >60)
GFR calc non Af Amer: 50 mL/min — ABNORMAL LOW (ref >60)
Glucose, Bld: 406 mg/dL — ABNORMAL HIGH (ref 70–99)
Potassium: 4 mmol/L (ref 3.5–5.1)
SODIUM: 133 mmol/L — AB (ref 135–145)

## 2018-09-20 LAB — TROPONIN I: Troponin I: 0.03 ng/mL (ref <0.03)

## 2018-09-20 LAB — CBC WITH DIFFERENTIAL/PLATELET
Abs Immature Granulocytes: 0.01 10*3/uL (ref 0.00–0.07)
BASOS ABS: 0 10*3/uL (ref 0.0–0.1)
Basophils Relative: 0 %
Eosinophils Absolute: 0 10*3/uL (ref 0.0–0.5)
Eosinophils Relative: 1 %
HEMATOCRIT: 41.5 % (ref 39.0–52.0)
HEMOGLOBIN: 12.9 g/dL — AB (ref 13.0–17.0)
Immature Granulocytes: 0 %
LYMPHS ABS: 2 10*3/uL (ref 0.7–4.0)
LYMPHS PCT: 31 %
MCH: 24.5 pg — ABNORMAL LOW (ref 26.0–34.0)
MCHC: 31.1 g/dL (ref 30.0–36.0)
MCV: 78.7 fL — ABNORMAL LOW (ref 80.0–100.0)
Monocytes Absolute: 0.3 10*3/uL (ref 0.1–1.0)
Monocytes Relative: 5 %
NEUTROS PCT: 63 %
NRBC: 0 % (ref 0.0–0.2)
Neutro Abs: 4.1 10*3/uL (ref 1.7–7.7)
Platelets: 238 10*3/uL (ref 150–400)
RBC: 5.27 MIL/uL (ref 4.22–5.81)
RDW: 14.1 % (ref 11.5–15.5)
WBC: 6.4 10*3/uL (ref 4.0–10.5)

## 2018-09-20 LAB — CBG MONITORING, ED: Glucose-Capillary: 276 mg/dL — ABNORMAL HIGH (ref 70–99)

## 2018-09-20 LAB — BRAIN NATRIURETIC PEPTIDE: B NATRIURETIC PEPTIDE 5: 96 pg/mL (ref 0.0–100.0)

## 2018-09-20 LAB — PROTIME-INR
INR: 2.49
Prothrombin Time: 26.6 seconds — ABNORMAL HIGH (ref 11.4–15.2)

## 2018-09-20 LAB — LACTIC ACID, PLASMA
Lactic Acid, Venous: 2 mmol/L (ref 0.5–1.9)
Lactic Acid, Venous: 1.7 mmol/L (ref 0.5–1.9)

## 2018-09-20 LAB — DIGOXIN LEVEL: Digoxin Level: 0.6 ng/mL — ABNORMAL LOW (ref 0.8–2.0)

## 2018-09-20 MED ORDER — MORPHINE SULFATE (PF) 4 MG/ML IV SOLN
4.0000 mg | INTRAVENOUS | Status: DC | PRN
  Administered 2018-09-20: 4 mg via INTRAVENOUS
  Filled 2018-09-20: qty 1

## 2018-09-20 MED ORDER — INSULIN ASPART 100 UNIT/ML ~~LOC~~ SOLN
4.0000 [IU] | Freq: Once | SUBCUTANEOUS | Status: AC
  Administered 2018-09-20: 4 [IU] via SUBCUTANEOUS
  Filled 2018-09-20: qty 1

## 2018-09-20 MED ORDER — SODIUM CHLORIDE 0.9 % IV BOLUS: 500.0000 mL | Freq: Once | INTRAVENOUS | Status: DC

## 2018-09-20 MED ORDER — ASPIRIN 81 MG PO CHEW
324.0000 mg | CHEWABLE_TABLET | Freq: Once | ORAL | Status: AC
  Administered 2018-09-20: 324 mg via ORAL
  Filled 2018-09-20: qty 4

## 2018-09-20 MED ORDER — BENZONATATE 100 MG PO CAPS: 100.0000 mg | ORAL_CAPSULE | Freq: Three times a day (TID) | ORAL | 0 refills | Status: DC | PRN

## 2018-09-20 MED ORDER — SODIUM CHLORIDE 0.9 % IV BOLUS
250.0000 mL | Freq: Once | INTRAVENOUS | Status: AC
  Administered 2018-09-20: 250 mL via INTRAVENOUS

## 2018-09-20 MED ORDER — SODIUM CHLORIDE 0.9 % IV SOLN
INTRAVENOUS | Status: DC
  Administered 2018-09-20: 15:00:00 via INTRAVENOUS

## 2018-09-20 NOTE — ED Triage Notes (Signed)
Pt has had right sided chest pain with productive cough for a week.

## 2018-09-20 NOTE — Discharge Instructions (Signed)
Take the prescription as directed. Take your usual prescriptions as previously directed. Apply moist heat or ice to the area(s) of discomfort, for 15 minutes at a time, several times per day for the next few days.  Do not fall asleep on a heating or ice pack.  Call your regular medical doctor on Monday to schedule a follow up appointment in the next 3 days.  Return to the Emergency Department immediately if worsening.
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
## 2018-09-20 NOTE — ED Notes (Signed)
[facility] EMS called to transport pt back to [place].

## 2018-09-20 NOTE — ED Notes (Signed)
Date and time results received: 09/20/18 2:06 PM  (use smartphrase ".now" to insert current time)  Test: Lactic Critical Value: 2.0  Name of Provider Notified: Thurnell Garbe  Orders Received? Or Actions Taken?: Orders Received - See Orders for details

## 2018-09-20 NOTE — ED Notes (Signed)
Per Dr. Thurnell Garbe no bolus at this time due to improved BP.

## 2018-09-20 NOTE — ED Notes (Signed)
Date and time results received: 09/20/18 3:54 PM  (use smartphrase ".now" to insert current time)  Test: Troponin Critical Value: 0.03  Name of Provider Notified: Thurnell Garbe  Orders Received? Or Actions Taken?: Orders Received - See Orders for details

## 2018-10-01 ENCOUNTER — Ambulatory Visit (HOSPITAL_COMMUNITY)
Admission: RE | Admit: 2018-10-01 | Discharge: 2018-10-01 | Disposition: A | Discharge status: Home / Self Care | Payer: Medicaid Other | Source: Ambulatory Visit | Attending: Internal Medicine | Admitting: Internal Medicine

## 2018-10-01 ENCOUNTER — Encounter (HOSPITAL_COMMUNITY): Payer: Self-pay

## 2018-10-01 VITALS — BP 106/78 | HR 84 | Wt 179.4 lb

## 2018-10-01 DIAGNOSIS — Z79899 Other long term (current) drug therapy: Secondary | ICD-10-CM | POA: Diagnosis not present

## 2018-10-01 DIAGNOSIS — I5042 Chronic combined systolic (congestive) and diastolic (congestive) heart failure: Secondary | ICD-10-CM

## 2018-10-01 DIAGNOSIS — E785 Hyperlipidemia, unspecified: Secondary | ICD-10-CM | POA: Insufficient documentation

## 2018-10-01 DIAGNOSIS — Z8249 Family history of ischemic heart disease and other diseases of the circulatory system: Secondary | ICD-10-CM | POA: Insufficient documentation

## 2018-10-01 DIAGNOSIS — Z7902 Long term (current) use of antithrombotics/antiplatelets: Secondary | ICD-10-CM | POA: Insufficient documentation

## 2018-10-01 DIAGNOSIS — Z955 Presence of coronary angioplasty implant and graft: Secondary | ICD-10-CM | POA: Insufficient documentation

## 2018-10-01 DIAGNOSIS — I255 Ischemic cardiomyopathy: Secondary | ICD-10-CM | POA: Insufficient documentation

## 2018-10-01 DIAGNOSIS — Z7984 Long term (current) use of oral hypoglycemic drugs: Secondary | ICD-10-CM | POA: Insufficient documentation

## 2018-10-01 DIAGNOSIS — E119 Type 2 diabetes mellitus without complications: Secondary | ICD-10-CM | POA: Insufficient documentation

## 2018-10-01 DIAGNOSIS — I5022 Chronic systolic (congestive) heart failure: Secondary | ICD-10-CM | POA: Insufficient documentation

## 2018-10-01 DIAGNOSIS — I110 Hypertensive heart disease with heart failure: Secondary | ICD-10-CM | POA: Diagnosis not present

## 2018-10-01 DIAGNOSIS — Z8673 Personal history of transient ischemic attack (TIA), and cerebral infarction without residual deficits: Secondary | ICD-10-CM | POA: Diagnosis not present

## 2018-10-01 DIAGNOSIS — G4733 Obstructive sleep apnea (adult) (pediatric): Secondary | ICD-10-CM | POA: Insufficient documentation

## 2018-10-01 DIAGNOSIS — F329 Major depressive disorder, single episode, unspecified: Secondary | ICD-10-CM | POA: Insufficient documentation

## 2018-10-01 DIAGNOSIS — I2510 Atherosclerotic heart disease of native coronary artery without angina pectoris: Secondary | ICD-10-CM | POA: Diagnosis not present

## 2018-10-01 DIAGNOSIS — I252 Old myocardial infarction: Secondary | ICD-10-CM | POA: Diagnosis not present

## 2018-10-01 DIAGNOSIS — I236 Thrombosis of atrium, auricular appendage, and ventricle as current complications following acute myocardial infarction: Secondary | ICD-10-CM | POA: Diagnosis not present

## 2018-10-01 DIAGNOSIS — Z7901 Long term (current) use of anticoagulants: Secondary | ICD-10-CM | POA: Diagnosis not present

## 2018-10-01 DIAGNOSIS — Z95810 Presence of automatic (implantable) cardiac defibrillator: Secondary | ICD-10-CM | POA: Diagnosis not present

## 2018-10-01 DIAGNOSIS — Z87891 Personal history of nicotine dependence: Secondary | ICD-10-CM | POA: Diagnosis not present

## 2018-10-01 LAB — BASIC METABOLIC PANEL
Anion gap: 11 (ref 5–15)
BUN: 14 mg/dL (ref 8–23)
CALCIUM: 9.3 mg/dL (ref 8.9–10.3)
CO2: 27 mmol/L (ref 22–32)
CREATININE: 1.33 mg/dL — AB (ref 0.61–1.24)
Chloride: 93 mmol/L — ABNORMAL LOW (ref 98–111)
GFR, EST NON AFRICAN AMERICAN: 57 mL/min — AB (ref >60)
Glucose, Bld: 426 mg/dL — ABNORMAL HIGH (ref 70–99)
Potassium: 4.2 mmol/L (ref 3.5–5.1)
SODIUM: 131 mmol/L — AB (ref 135–145)

## 2018-10-01 MED ORDER — ISOSORBIDE MONONITRATE ER 30 MG PO TB24: 30.0000 mg | ORAL_TABLET | Freq: Every day | ORAL | 6 refills | Status: DC

## 2018-10-01 NOTE — Patient Instructions (Addendum)
START Imdur 30mg  (1 tab) daily  Labs today We will only contact you if something comes back abnormal or we need to make some changes. Otherwise no news is good news!  Your physician recommends that you schedule a follow-up appointment in: 3 months in [facility].

## 2018-10-01 NOTE — Progress Notes (Signed)
Advanced Heart Failure Clinic Note  PCP: Dr Alda Ponder Primary Cardiologist: Dr. Harl Bowie HF Cardiology: Dr. Aundra Dubin   HPI: Daniel Li is a 62 y.o. male with long history of CAD s/p multiple ACS episodes, HTN, HLD, tobacco abuse, DM, OSA, CVA and systolic CHF.   He has a history of CAD with NSTEMI in 2007 with BMS placement to his D1 and OM2. Also with history of STEMI in May 2013 at which time he had a BMS placed to his RCA. NSTEMI in January of 2014, cath showed CTO of left circumflex, unable to intervene upon. EF 55-60% in September 2016. Anterior STEMI 4/18, DES to pLAD.   Admitted 8/9 - 06/11/17 with chest pain. Cath as below. No targets for PCI, LAD stent patent. EF remained low at 20-25% as below. Now with St Jude ICD.   Admitted 4/16 - 02/17/2018 with NSTEMI. Underwent LHC again with unchanged anatomy, no intervention. Discharged back to assisted living.   He was admitted again in 10/19 with NSTEMI.  LHC showed totally occluded PLV, 75% PDA stenosis, 80% mid LAD, occluded LCx.  He had DES to mLAD. Repeat echo showed EF 20-25%.   Today he returns for HF follow up. He was evaluated in [facility] ED on 09/20/18 with chest pain. Cardiac enzymes negative and EKG was unchaged so he was discharged home. Last visit he was started on entresto. Overall feeling fine. Denies SOB/PND/Orthopnea. Appetite ok. No fever or chills. Weight at home 174-177 pounds. Taking all medications.  ECG (personally reviewed): NSR, old ASMI  Labs (8/18): K 3.7, creatinine 1.42 Labs (9/18): digoxin < 0.2 Labs (11/18): K 3.8, creatinine 1.03 Labs (12/18): K 4, creatinine 1.27, digoxin 0.2, hgb 14.2, LDL 51, HDL 49 Labs (03/31/2018): K 3.8 Creatinine 1.1 dig level 0.3  Labs (8/19): K 3.5, creatinine 0.98, BNP 88, digoxin 0.4 Labs (9/19): K 3.9 Creatinine 1.34, LDL 64, TGs 151 Labs (10/19): K 4.2, creatinine 1.29, hgb 12.3  Review of systems complete and found to be negative unless listed in HPI.    Past Medical  History 1. OSA: Supposed to be using CPAP.  2. HTN 3. CAD: Long history.  - NSTEMI 2007 with BMS D1 and OM2. - STEMI 5/13 with BMS RCA - NSTEMI 1/14 with CTO LCx and PLV, unable to open LCx.  - STEMI 4/18 with LHC showing old TO LCx with collaterals, old TO PLV, 70% ostial D1, totally occluded proximal LAD with some collaterals => DES to LAD.  - LHC 8/18 with old TO LCx, old TO PLV, 60% mLAD, patent LAD stent.  - LHC 4/19 with totally occluded PLV, totally occluded LCx, patent LAD and D1 stents, 50% mid LAD.  - NSTEMI 10/19. LHC with totally occluded PLV, totally occluded LCx, patent proximal LAD and D1 stents, 80% mid LAD stenosis => DES to mid LAD.  4. Depression 5. Type II DM 6. Hyperlipidemia: History of rhabdomyolysis with statins, he is on Zetia.  7. Chronic systolic CHF: Ischemic cardiomyopathy.  Echo from 9/16 showed improvement in EF to 55-60%.  St Jude ICD.  - Echo (4/18) with EF 20-25%, dyskinetic apex, moderate-severe MR, severe LAE.  - Echo (8/18) with EF 20-25%, moderate to severe MR - Echo (4/19) with EF 20-25%  - Echo (10/19) with EF 20-25%, no LV thrombus.  - RHC (10/19): mean RA 4, PA 22/7, mean PCWP 7, CI 2.3 8. PAD: - ABIs (4/18) with R ABI of 0.84 suggestive of mild disease.  L ABI of 1.27 (Normal  flow at rest) - ABIs (5/19): abnormal TBI on right.  9. LV thrombus  Current Outpatient Medications  Medication Sig Dispense Refill  . acetaminophen (TYLENOL) 325 MG tablet Take 650 mg by mouth every 6 (six) hours as needed.    . benzonatate (TESSALON) 100 MG capsule Take 1 capsule (100 mg total) by mouth 3 (three) times daily as needed for cough. 15 capsule 0  . carvedilol (COREG) 6.25 MG tablet Take 1 tablet (6.25 mg total) by mouth 2 (two) times daily with a meal. 60 tablet 6  . clopidogrel (PLAVIX) 75 MG tablet Take 1 tablet (75 mg total) by mouth daily with breakfast. 90 tablet 3  . digoxin (LANOXIN) 0.125 MG tablet Take 1 tablet (0.125 mg total) by mouth daily. 30  tablet 3  . Evolocumab (REPATHA SURECLICK) 449 MG/ML SOAJ Inject 1 pen into the skin every 14 (fourteen) days. 2 pen 3  . ezetimibe (ZETIA) 10 MG tablet Take 1 tablet (10 mg total) by mouth daily. 30 tablet 3  . fenofibrate (TRICOR) 145 MG tablet Take 1 tablet (145 mg total) by mouth daily. 30 tablet 6  . gabapentin (NEURONTIN) 100 MG capsule Take 100 mg by mouth 2 (two) times daily.    [name] Kitchen glipiZIDE (GLUCOTROL XL) 10 MG 24 hr tablet Take 1 tablet (10 mg total) by mouth daily. 30 tablet 1  . losartan (COZAAR) 25 MG tablet Take 12.5 mg by mouth daily.    . meclizine (ANTIVERT) 25 MG tablet Take 12.5 mg by mouth 3 (three) times daily as needed for dizziness.    . nicotine (NICODERM CQ - DOSED IN MG/24 HOURS) 21 mg/24hr patch Place 1 patch (21 mg total) onto the skin daily. 28 patch 0  . pantoprazole (PROTONIX) 40 MG tablet Take 1 tablet (40 mg total) by mouth 2 (two) times daily. 60 tablet 1  . potassium chloride SA (K-DUR,KLOR-CON) 20 MEQ tablet Take 1 tablet (20 mEq total) by mouth daily. 30 tablet 3  . sacubitril-valsartan (ENTRESTO) 24-26 MG Take 1 tablet by mouth 2 (two) times daily. 60 tablet 3  . spironolactone (ALDACTONE) 25 MG tablet Take 1 tablet (25 mg total) by mouth at bedtime. 30 tablet 3  . torsemide (DEMADEX) 20 MG tablet Take 20 mg by mouth daily.    [name] Kitchen warfarin (COUMADIN) 5 MG tablet Take coumadin 7.5mg  tonight then resume 5mg  daily (Patient taking differently: Take 5 mg by mouth every evening. ) 45 tablet 11  . nitroGLYCERIN (NITROSTAT) 0.4 MG SL tablet PLACE ONE (1) TABLET UNDER TONGUE EVERY 5 MINUTES UP TO (3) DOSES AS NEEDED FOR CHEST PAIN. IF NO RELIEF, CONTACT MD. (Patient not taking: No sig reported) 25 tablet 3   No current facility-administered medications for this encounter.     Allergies  Allergen Reactions  . Pork-Derived Products Other (See Comments)    Does not eat for religious reasons - okay with using IV heparin      Social History   Socioeconomic History   . Marital status: Single    Spouse name: Not on file  . Number of children: 1  . Years of education: Not on file  . Highest education level: Not on file  Occupational History  . Not on file  Social Needs  . Financial resource strain: Not on file  . Food insecurity:    Worry: Not on file    Inability: Not on file  . Transportation needs:    Medical: Not on file    Non-medical:  Not on file  Tobacco Use  . Smoking status: Former Smoker    Packs/day: 0.50    Years: 20.00    Pack years: 10.00    Types: Cigarettes, Cigars    Start date: 07/21/1977    Last attempt to quit: 10/15/2017    Years since quitting: 0.9  . Smokeless tobacco: Never Used  Substance and Sexual Activity  . Alcohol use: Yes    Comment: 09/17/2017 "nothing since 05/2017"  . Drug use: No    Comment: previously incarcerated for drug related offense.  [name] Kitchen Sexual activity: Not Currently  Lifestyle  . Physical activity:    Days per week: Not on file    Minutes per session: Not on file  . Stress: Not on file  Relationships  . Social connections:    Talks on phone: Not on file    Gets together: Not on file    Attends religious service: Not on file    Active member of club or organization: Not on file    Attends meetings of clubs or organizations: Not on file    Relationship status: Not on file  . Intimate partner violence:    Fear of current or ex partner: Not on file    Emotionally abused: Not on file    Physically abused: Not on file    Forced sexual activity: Not on file  Other Topics Concern  . Not on file  Social History Narrative  . Not on file      Family History  Problem Relation Age of Onset  . Stroke Mother   . Heart attack Mother   . Heart attack Father   . Stroke Sister   . Heart attack Sister   . Heart attack Brother   . Stroke Brother   . Liver disease Neg Hx   . Colon cancer Neg Hx    Vitals:   10/01/18 1050  BP: 106/78  Pulse: 84  SpO2: 96%  Weight: 81.4 kg (179 lb 6.4 oz)    Wt Readings from Last 3 Encounters:  10/01/18 81.4 kg (179 lb 6.4 oz)  09/20/18 82 kg (180 lb 12.4 oz)  09/11/18 82.1 kg (181 lb)   PHYSICAL EXAM: General:  Well appearing. No resp difficulty HEENT: normal Neck: supple. no JVD. Carotids 2+ bilat; no bruits. No lymphadenopathy or thryomegaly appreciated. Cor: PMI nondisplaced. Regular rate & rhythm. No rubs, gallops or murmurs. Lungs: clear Abdomen: soft, nontender, nondistended. No hepatosplenomegaly. No bruits or masses. Good bowel sounds. Extremities: no cyanosis, clubbing, rash, edema Neuro: alert & orientedx3, cranial nerves grossly intact. moves all 4 extremities w/o difficulty. Affect pleasant  ASSESSMENT & PLAN:  1. CAD:  Extensive prior history of CAD. He has known total occlusion LCx and PLV.  NSTEMI 10/19 with DES to mid LAD.  No further chest pain.  - Continue coumadin with history of LV thrombus.   - Continue Plavix with recent stent. Add 30 mg imdur daily.  - No statin given history of rhabdomyolysis on statin. He is now on Repatha and Zetia -Referred to [facility] cardiac rehab. He will start in January 2nd, 2020.  . 2. Chronic systolic BTD:HRCBULAG cardiomyopathy, St Jude ICD. Echo in 10/19 with EF 20-25%.   - NYHA II. - Volume status stable. Does not need diuretics.  - Continue Coreg 6.25 mg bid.  - Continue Entresto 24/26 bid.  - Continue spironolactone 25 mg daily.  - Continue digoxin 0.125, dig level 0.6 on 09/20/2018.  - Check BMET  3. Diabetes: He is on glipizide, would recommend switching to dapagliflozin or empagliflozin.  4. Smoking: He has quit smoking.  5. PAD: No claudication, foot ulcers.   6. LV thrombus:  On coumadin as above. No bleeding problems.   7. 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  - On warfarin for LV thrombus.   Follow up 3 months. Check BMEt today.  Greater than 50% of the (total minutes 25) visit spent in counseling/coordination of care regarding the above medication  changes.       Darrick Grinder, NP 10/01/18

## 2018-10-01 NOTE — Progress Notes (Signed)
CSW met with pt to address concerns on SDOH assessment.  Pt expressed that he has issues paying for basics but when questioned pt lives at ALF and has everything provided for him there, medications covered by insurance, food provided at facility.  Pt also answered that there are concerns with affording food each month.  When asked pt reports that all 3 meals are provided by his ALF and he does not have concerns with obtaining food.  Pt also answered that he had concerns with transportation.  Pt gets medicaid transportation but he thought he had been told he would be charged $3/trip for his upcoming cardiac rehab appointments.  CSW assisted pt in calling [facility] who confirms that patient should not be charged for transport to medical appointments.  CSW will continue to follow patient in clinic and assist as needed  Jorge Ny, [place] Worker [facility] [phone]

## 2018-10-16 ENCOUNTER — Telehealth: Payer: Self-pay

## 2018-10-16 ENCOUNTER — Ambulatory Visit (INDEPENDENT_AMBULATORY_CARE_PROVIDER_SITE_OTHER): Payer: Medicaid Other

## 2018-10-16 DIAGNOSIS — I5022 Chronic systolic (congestive) heart failure: Secondary | ICD-10-CM

## 2018-10-16 DIAGNOSIS — Z95810 Presence of automatic (implantable) cardiac defibrillator: Secondary | ICD-10-CM

## 2018-10-16 DIAGNOSIS — I255 Ischemic cardiomyopathy: Secondary | ICD-10-CM

## 2018-10-16 NOTE — Telephone Encounter (Signed)
Faxed

## 2018-10-16 NOTE — Telephone Encounter (Signed)
Confirmed remote transmission w/ pt nurse.  Pt nurse also have questions about his INR check. She needs to be fax the INR check for the last time he was here. The fax number is [phone]

## 2018-10-17 ENCOUNTER — Other Ambulatory Visit: Payer: Self-pay | Admitting: Internal Medicine

## 2018-10-17 NOTE — Progress Notes (Signed)
EPIC Encounter for ICM Monitoring  Patient Name: Daniel Li is a 62 y.o. male Date: 10/17/2018 Primary Care Physican: [name], NP Primary Cardiologist:Branch/ [facility] Electrophysiologist:Allred Dry Weight:unknown                                                    Transmission reviewed   Thoracic impedance normal.  Prescribed dosage: Torsemide20 mgTake1 tablet (30mtotal)by mouth daily.  Labs: 05/27/2018 Creatinine1.05, BUN16, Potassium4.3, Sodium133, EGFR>60 05/26/2018 Creatinine1.18, BUN26, Potassium3.1, [id number] EGFR>60  05/25/2018 Creatinine1.60, BUN48, Potassium2.7, [id number] 05/24/2018 Creatinine2.00, BUN52, Potassium3.4, [id number] 05/17/2018 Creatinine1.24, BUN24, Potassium3.8, Sodium134, EGFR>60  05/16/2018 Creatinine1.34, BUN34, Potassium3.8, Sodium132, EGFR56->60  05/15/2018 Creatinine1.89, BUN52, Potassium3.6, Sodium131, [id number] 05/14/2018 Creatinine2.09, BUN38, Potassium3.7, [id number] 05/12/2018 Creatinine1.71, BUN28, Potassium3.3, [id number] 05/06/2018 Creatinine1.13, BUN17, Potassium3.6, Sodium138, EGFR>60  04/29/2018 Creatinine1.10, BUN11, Potassium4.0, Sodium139 03/31/2018 Creatinine1.09, BUN14, Potassium3.8, Sodium139, EGFR>60  03/05/2018 Creatinine1.23, BUN15, Potassium4.2, Sodium139, EGFR>60 A complete set of results can be found in Results Review.  Recommendations: None  Follow-up plan: ICM clinic phone appointment on 11/20/2018.       Copy of ICM check sent to Dr. ARayann Heman  3 month ICM trend: 10/16/2018    1 Year ICM trend:       LRosalene Billings RN 10/17/2018 1:48 PM

## 2018-10-17 NOTE — Progress Notes (Signed)
Remote ICD transmission.   

## 2018-10-23 ENCOUNTER — Other Ambulatory Visit (HOSPITAL_COMMUNITY): Payer: Self-pay

## 2018-10-23 MED ORDER — TORSEMIDE 20 MG PO TABS: 20.0000 mg | ORAL_TABLET | Freq: Every day | ORAL | 5 refills | Status: DC

## 2018-11-05 ENCOUNTER — Encounter (HOSPITAL_COMMUNITY): Payer: Medicaid Other

## 2018-11-12 ENCOUNTER — Encounter (HOSPITAL_COMMUNITY)
Admission: RE | Admit: 2018-11-12 | Discharge: 2018-11-12 | Disposition: A | Discharge status: Home / Self Care | Payer: Medicaid Other | Source: Ambulatory Visit | Attending: Cardiology | Admitting: Cardiology

## 2018-11-12 DIAGNOSIS — I214 Non-ST elevation (NSTEMI) myocardial infarction: Secondary | ICD-10-CM | POA: Insufficient documentation

## 2018-11-12 DIAGNOSIS — Z955 Presence of coronary angioplasty implant and graft: Secondary | ICD-10-CM | POA: Diagnosis not present

## 2018-11-12 LAB — GLUCOSE, CAPILLARY: Glucose-Capillary: 334 mg/dL — ABNORMAL HIGH (ref 70–99)

## 2018-11-12 NOTE — Progress Notes (Signed)
.  Cardiac/Pulmonary Rehab Medication Review by a Pharmacist  Does the patient  feel that his/her medications are working for him/her?  yes  Has the patient been experiencing any side effects to the medications prescribed?  yes  Does the patient measure his/her own blood pressure or blood glucose at home?  yes   Does the patient have any problems obtaining medications due to transportation or finances?   no  Understanding of regimen: fair Understanding of indications: fair Potential of compliance: fair  Questions asked to Determine Patient Understanding of Medication Regimen:  1. What is the name of the medication?  2. What is the medication used for?  3. When should it be taken?  4. How much should be taken?  5. How will you take it?  6. What side effects should you report?  Understanding Defined as: Excellent: All questions above are correct Good: Questions 1-4 are correct Fair: Questions 1-2 are correct  Poor: 1 or none of the above questions are correct   Pharmacist comments: Patient feels that mediations are working for him and no real side effects from medications. He did note that the torsemide has him up all night going to the bathroom.  He feels dizzy from these effects.  Advised patient that if possible, take torsemide no later than 1400, so that he can use the bathroom during the day.    Despina Pole 11/12/2018 2:07 PM

## 2018-11-16 LAB — CUP PACEART REMOTE DEVICE CHECK
Battery Remaining Longevity: 94 mo
Battery Remaining Percentage: 89 %
HIGH POWER IMPEDANCE MEASURED VALUE: 70 Ohm
HighPow Impedance: 70 Ohm
Implantable Pulse Generator Implant Date: 20181120
Lead Channel Sensing Intrinsic Amplitude: 12 mV
Lead Channel Setting Pacing Amplitude: 2.5 V
Lead Channel Setting Pacing Pulse Width: 0.5 ms
Lead Channel Setting Sensing Sensitivity: 0.5 mV
MDC IDC LEAD IMPLANT DT: 20181120
MDC IDC LEAD LOCATION: 753860
MDC IDC MSMT BATTERY VOLTAGE: 3.08 V
MDC IDC MSMT LEADCHNL RV IMPEDANCE VALUE: 350 Ohm
MDC IDC MSMT LEADCHNL RV PACING THRESHOLD AMPLITUDE: 0.75 V
MDC IDC MSMT LEADCHNL RV PACING THRESHOLD PULSEWIDTH: 0.5 ms
MDC IDC PG SERIAL: 9786940
MDC IDC SESS DTM: 20191219233803
MDC IDC STAT BRADY RV PERCENT PACED: 1 %

## 2018-11-17 ENCOUNTER — Other Ambulatory Visit (HOSPITAL_COMMUNITY): Payer: Self-pay | Admitting: Adult Health

## 2018-11-20 ENCOUNTER — Ambulatory Visit (INDEPENDENT_AMBULATORY_CARE_PROVIDER_SITE_OTHER): Payer: Medicaid Other

## 2018-11-20 DIAGNOSIS — Z95810 Presence of automatic (implantable) cardiac defibrillator: Secondary | ICD-10-CM

## 2018-11-20 DIAGNOSIS — I5022 Chronic systolic (congestive) heart failure: Secondary | ICD-10-CM | POA: Diagnosis not present

## 2018-11-21 ENCOUNTER — Telehealth: Payer: Self-pay

## 2018-11-21 NOTE — Telephone Encounter (Signed)
Remote ICM transmission received.  Attempted call to patient regarding ICM remote transmission and line busy.  

## 2018-11-21 NOTE — Progress Notes (Signed)
EPIC Encounter for ICM Monitoring  Patient Name: Daniel Li is a 63 y.o. male Date: 11/21/2018 Primary Care Physican: [name], NP Primary Cardiologist:Branch/ [facility] Electrophysiologist:Allred Dry Weight:unknown  Attempted call to patient.   Transmission reviewed.   Thoracic impedance normal.  Prescribed dosage:Torsemide20 mgTake1 tablet (1mtotal)by mouth daily.  Labs: 10/01/2018 Creatinine 1.33, BUN 14, Potassium 4.2, Sodium 131, eGFR 57->60 09/20/2018 Creatinine 1.46, BUN 16, Potassium 4.0, Sodium 133, eGFR 50-58  09/02/2018 Creatinine 1.49, BUN 15, Potassium 4.3, Sodium 130, eGFR 49-56  08/26/2018 Creatinine 1.29, BUN 14, Potassium 4.2, Sodium 136, eGFR 58->60  08/22/2018 Creatinine 1.12, BUN 14, Potassium 3.9, Sodium 135, eGFR >60  07/30/2018 Creatinine 1.25, BUN 10, Potassium 3.9, Sodium 136, eGFR >60  07/21/2018 Creatinine 1.34, BUN 15, Potassium 3.9, Sodium 137, eGFR 55->60  07/09/2018 Creatinine 1.24, BUN 9, Potassium 3.7, Sodium 139, eGFR >60  06/16/2018 Creatinine 0.98, BUN 12, Potassium 3.5, Sodium 140, eGFR >60  05/27/2018 Creatinine1.05, BUN16, Potassium4.3, Sodium133, EGFR>60 05/26/2018 Creatinine1.18, BUN26, Potassium3.1, Sodium125, EGFR>60  A complete set of results can be found in Results Review.  Recommendations:Unable to reach.  Follow-up plan: ICM clinic phone appointment on2/25/2020.  Office appointment with HF clinic 12/31/2018.  Copy of ICM check sent to Dr.Allred.  3 month ICM trend: 11/20/2018    1 Year ICM trend:       LRosalene Billings RN 11/21/2018 1:39 PM

## 2018-12-02 ENCOUNTER — Other Ambulatory Visit (HOSPITAL_COMMUNITY): Payer: Self-pay | Admitting: Cardiology

## 2018-12-22 ENCOUNTER — Other Ambulatory Visit (HOSPITAL_COMMUNITY): Payer: Self-pay | Admitting: Cardiology

## 2018-12-23 ENCOUNTER — Ambulatory Visit (INDEPENDENT_AMBULATORY_CARE_PROVIDER_SITE_OTHER): Payer: Medicaid Other

## 2018-12-23 DIAGNOSIS — I5022 Chronic systolic (congestive) heart failure: Secondary | ICD-10-CM | POA: Diagnosis not present

## 2018-12-23 DIAGNOSIS — Z95810 Presence of automatic (implantable) cardiac defibrillator: Secondary | ICD-10-CM

## 2018-12-24 NOTE — Progress Notes (Signed)
EPIC Encounter for ICM Monitoring  Patient Name: Daniel Li is a 63 y.o. male Date: 12/24/2018 Primary Care Physican: [name], NP Primary Cardiologist:Branch/ [facility] Electrophysiologist:Allred Dry Weight:159.8 lbs  Heart Failure questions reviewed and patient asymptomatic.   Thoracic impedance normal.  Prescribed dosage:Torsemide20 mgTake1 tablet (20mg total)by mouth daily.  Labs: 10/01/2018 Creatinine 1.33, BUN 14, Potassium 4.2, Sodium 131, GFR 57->60 09/20/2018 Creatinine 1.46, BUN 16, Potassium 4.0, Sodium 133, GFR 50-58  09/02/2018 Creatinine 1.49, BUN 15, Potassium 4.3, Sodium 130, GFR 49-56  08/26/2018 Creatinine 1.29, BUN 14, Potassium 4.2, Sodium 136, GFR 58->60  08/22/2018 Creatinine 1.12, BUN 14, Potassium 3.9, Sodium 135, GFR >60  07/30/2018 Creatinine 1.25, BUN 10, Potassium 3.9, Sodium 136, GFR >60  07/21/2018 Creatinine 1.34, BUN 15, Potassium 3.9, Sodium 137, GFR 55->60  07/09/2018 Creatinine 1.24, BUN 9,   Potassium 3.7, Sodium 139, GFR >60  06/16/2018 Creatinine 0.98, BUN 12, Potassium 3.5, Sodium 140, GFR >60  05/27/2018 Creatinine1.05, BUN16, Potassium4.3, Sodium133, GFR>60 05/26/2018 Creatinine1.18, BUN26, Potassium3.1, Sodium125, GFR>60  A complete set of results can be found in Results Review.  Recommendations: No changes and encouraged to call for fluid symptoms.  Follow-up plan: ICM clinic phone appointment on3/30/2020. Office appointment with HF clinic 12/31/2018.  Copy of ICM check sent to Dr.Allred.  3 month ICM trend: 12/23/2018    1 Year ICM trend:       Rosalene Billings, RN 12/24/2018 8:29 AM

## 2018-12-31 ENCOUNTER — Encounter (HOSPITAL_COMMUNITY): Payer: Self-pay

## 2018-12-31 ENCOUNTER — Ambulatory Visit (HOSPITAL_COMMUNITY)
Admission: RE | Admit: 2018-12-31 | Discharge: 2018-12-31 | Disposition: A | Discharge status: Home / Self Care | Payer: Medicaid Other | Source: Ambulatory Visit | Attending: Cardiology | Admitting: Cardiology

## 2018-12-31 ENCOUNTER — Other Ambulatory Visit: Payer: Self-pay

## 2018-12-31 VITALS — BP 102/78 | HR 75 | Wt 172.0 lb

## 2018-12-31 DIAGNOSIS — I110 Hypertensive heart disease with heart failure: Secondary | ICD-10-CM | POA: Diagnosis not present

## 2018-12-31 DIAGNOSIS — Z7901 Long term (current) use of anticoagulants: Secondary | ICD-10-CM | POA: Diagnosis not present

## 2018-12-31 DIAGNOSIS — Z955 Presence of coronary angioplasty implant and graft: Secondary | ICD-10-CM | POA: Diagnosis not present

## 2018-12-31 DIAGNOSIS — I2510 Atherosclerotic heart disease of native coronary artery without angina pectoris: Secondary | ICD-10-CM | POA: Diagnosis not present

## 2018-12-31 DIAGNOSIS — E785 Hyperlipidemia, unspecified: Secondary | ICD-10-CM | POA: Insufficient documentation

## 2018-12-31 DIAGNOSIS — I255 Ischemic cardiomyopathy: Secondary | ICD-10-CM | POA: Diagnosis not present

## 2018-12-31 DIAGNOSIS — Z95810 Presence of automatic (implantable) cardiac defibrillator: Secondary | ICD-10-CM

## 2018-12-31 DIAGNOSIS — I10 Essential (primary) hypertension: Secondary | ICD-10-CM

## 2018-12-31 DIAGNOSIS — I252 Old myocardial infarction: Secondary | ICD-10-CM | POA: Insufficient documentation

## 2018-12-31 DIAGNOSIS — Z79899 Other long term (current) drug therapy: Secondary | ICD-10-CM | POA: Insufficient documentation

## 2018-12-31 DIAGNOSIS — Z7984 Long term (current) use of oral hypoglycemic drugs: Secondary | ICD-10-CM | POA: Insufficient documentation

## 2018-12-31 DIAGNOSIS — G4733 Obstructive sleep apnea (adult) (pediatric): Secondary | ICD-10-CM | POA: Insufficient documentation

## 2018-12-31 DIAGNOSIS — I5022 Chronic systolic (congestive) heart failure: Secondary | ICD-10-CM

## 2018-12-31 DIAGNOSIS — M6282 Rhabdomyolysis: Secondary | ICD-10-CM | POA: Diagnosis not present

## 2018-12-31 DIAGNOSIS — Z87891 Personal history of nicotine dependence: Secondary | ICD-10-CM | POA: Insufficient documentation

## 2018-12-31 DIAGNOSIS — E119 Type 2 diabetes mellitus without complications: Secondary | ICD-10-CM | POA: Insufficient documentation

## 2018-12-31 DIAGNOSIS — Z8249 Family history of ischemic heart disease and other diseases of the circulatory system: Secondary | ICD-10-CM | POA: Diagnosis not present

## 2018-12-31 DIAGNOSIS — R42 Dizziness and giddiness: Secondary | ICD-10-CM | POA: Diagnosis not present

## 2018-12-31 DIAGNOSIS — Z8673 Personal history of transient ischemic attack (TIA), and cerebral infarction without residual deficits: Secondary | ICD-10-CM | POA: Insufficient documentation

## 2018-12-31 DIAGNOSIS — Z7902 Long term (current) use of antithrombotics/antiplatelets: Secondary | ICD-10-CM | POA: Insufficient documentation

## 2018-12-31 LAB — BASIC METABOLIC PANEL
ANION GAP: 7 (ref 5–15)
BUN: 9 mg/dL (ref 8–23)
CHLORIDE: 102 mmol/L (ref 98–111)
CO2: 27 mmol/L (ref 22–32)
Calcium: 9.6 mg/dL (ref 8.9–10.3)
Creatinine, Ser: 1.23 mg/dL (ref 0.61–1.24)
Glucose, Bld: 344 mg/dL — ABNORMAL HIGH (ref 70–99)
POTASSIUM: 4.5 mmol/L (ref 3.5–5.1)
SODIUM: 136 mmol/L (ref 135–145)

## 2018-12-31 LAB — CBC
HEMATOCRIT: 43.7 % (ref 39.0–52.0)
Hemoglobin: 13.4 g/dL (ref 13.0–17.0)
MCH: 24.2 pg — ABNORMAL LOW (ref 26.0–34.0)
MCHC: 30.7 g/dL (ref 30.0–36.0)
MCV: 78.9 fL — ABNORMAL LOW (ref 80.0–100.0)
NRBC: 0 % (ref 0.0–0.2)
Platelets: 248 10*3/uL (ref 150–400)
RBC: 5.54 MIL/uL (ref 4.22–5.81)
RDW: 15.8 % — ABNORMAL HIGH (ref 11.5–15.5)
WBC: 6.7 10*3/uL (ref 4.0–10.5)

## 2018-12-31 MED ORDER — LOSARTAN POTASSIUM 25 MG PO TABS: 25.0000 mg | ORAL_TABLET | Freq: Every day | ORAL | 3 refills | Status: DC

## 2018-12-31 NOTE — Progress Notes (Signed)
Advanced Heart Failure Clinic Note  PCP: Dr Alda Ponder Primary Cardiologist: Dr. Harl Bowie HF Cardiology: Dr. Aundra Dubin   HPI: Daniel Li is a 63 y.o. male with long history of CAD s/p multiple ACS episodes, HTN, HLD, tobacco abuse, DM, OSA, CVA and systolic CHF.   He has a history of CAD with NSTEMI in 2007 with BMS placement to his D1 and OM2. Also with history of STEMI in May 2013 at which time he had a BMS placed to his RCA. NSTEMI in January of 2014, cath showed CTO of left circumflex, unable to intervene upon. EF 55-60% in September 2016. Anterior STEMI 4/18, DES to pLAD.   Admitted 8/9 - 06/11/17 with chest pain. Cath as below. No targets for PCI, LAD stent patent. EF remained low at 20-25% as below. Now with St Jude ICD.   Admitted 4/16 - 02/17/2018 with NSTEMI. Underwent LHC again with unchanged anatomy, no intervention. Discharged back to assisted living.   He was admitted again in 10/19 with NSTEMI.  LHC showed totally occluded PLV, 75% PDA stenosis, 80% mid LAD, occluded LCx.  He had DES to mLAD. Repeat echo showed EF 20-25%.   He presents today for regular follow up. He is doing well overall. Has had no further chest pain. He has been having lightheadedness since starting Entresto. It is worse in the morning, and worse even still after taking his medications. He denies SOB, orthopnea, or PND. Appetite is OK. Denies fever or chills. Weight at home stable around 170 lbs. He is taking all medications as directed, assisted by ALF.   ICD: Corevue: Shows thoracic impedence decreased over the past 7 days. Pt active for about 4-6 hours day. Personally reviewed.   ECG (personally reviewed): NSR, old ASMI  Labs (8/18): K 3.7, creatinine 1.42 Labs (9/18): digoxin < 0.2 Labs (11/18): K 3.8, creatinine 1.03 Labs (12/18): K 4, creatinine 1.27, digoxin 0.2, hgb 14.2, LDL 51, HDL 49 Labs (03/31/2018): K 3.8 Creatinine 1.1 dig level 0.3  Labs (8/19): K 3.5, creatinine 0.98, BNP 88, digoxin  0.4 Labs (9/19): K 3.9 Creatinine 1.34, LDL 64, TGs 151 Labs (10/19): K 4.2, creatinine 1.29, hgb 12.3  Review of systems complete and found to be negative unless listed in HPI.    Past Medical History 1. OSA: Supposed to be using CPAP.  2. HTN 3. CAD: Long history.  - NSTEMI 2007 with BMS D1 and OM2. - STEMI 5/13 with BMS RCA - NSTEMI 1/14 with CTO LCx and PLV, unable to open LCx.  - STEMI 4/18 with LHC showing old TO LCx with collaterals, old TO PLV, 70% ostial D1, totally occluded proximal LAD with some collaterals => DES to LAD.  - LHC 8/18 with old TO LCx, old TO PLV, 60% mLAD, patent LAD stent.  - LHC 4/19 with totally occluded PLV, totally occluded LCx, patent LAD and D1 stents, 50% mid LAD.  - NSTEMI 10/19. LHC with totally occluded PLV, totally occluded LCx, patent proximal LAD and D1 stents, 80% mid LAD stenosis => DES to mid LAD.  4. Depression 5. Type II DM 6. Hyperlipidemia: History of rhabdomyolysis with statins, he is on Zetia.  7. Chronic systolic CHF: Ischemic cardiomyopathy.  Echo from 9/16 showed improvement in EF to 55-60%.  St Jude ICD.  - Echo (4/18) with EF 20-25%, dyskinetic apex, moderate-severe MR, severe LAE.  - Echo (8/18) with EF 20-25%, moderate to severe MR - Echo (4/19) with EF 20-25%  - Echo (10/19) with EF 20-25%,  no LV thrombus.  - RHC (10/19): mean RA 4, PA 22/7, mean PCWP 7, CI 2.3 8. PAD: - ABIs (4/18) with R ABI of 0.84 suggestive of mild disease.  L ABI of 1.27 (Normal flow at rest) - ABIs (5/19): abnormal TBI on right.  9. LV thrombus  Current Outpatient Medications  Medication Sig Dispense Refill  . carvedilol (COREG) 6.25 MG tablet Take 1 tablet (6.25 mg total) by mouth 2 (two) times daily with a meal. 60 tablet 6  . clopidogrel (PLAVIX) 75 MG tablet Take 1 tablet (75 mg total) by mouth daily with breakfast. 90 tablet 3  . digoxin (LANOXIN) 0.125 MG tablet Take 1 tablet (0.125 mg total) by mouth daily. 30 tablet 3  . ENTRESTO 24-26 MG  TAKE 1 TABLET BY MOUTH TWICE DAILY. 60 tablet 11  . ezetimibe (ZETIA) 10 MG tablet Take 1 tablet (10 mg total) by mouth daily. 30 tablet 3  . fenofibrate (TRICOR) 145 MG tablet TAKE 1 TABLET BY MOUTH DAILY. 30 tablet 11  . gabapentin (NEURONTIN) 100 MG capsule Take 100 mg by mouth 2 (two) times daily.    [name] Kitchen glipiZIDE (GLUCOTROL XL) 10 MG 24 hr tablet Take 1 tablet (10 mg total) by mouth daily. 30 tablet 1  . isosorbide mononitrate (IMDUR) 30 MG 24 hr tablet Take 1 tablet (30 mg total) by mouth daily. 30 tablet 6  . meclizine (ANTIVERT) 25 MG tablet Take 12.5 mg by mouth 3 (three) times daily as needed for dizziness.    . nicotine (NICODERM CQ - DOSED IN MG/24 HOURS) 21 mg/24hr patch Place 1 patch (21 mg total) onto the skin daily. 28 patch 0  . nitroGLYCERIN (NITROSTAT) 0.4 MG SL tablet PLACE ONE (1) TABLET UNDER TONGUE EVERY 5 MINUTES UP TO (3) DOSES AS NEEDED FOR CHEST PAIN. IF NO RELIEF, CONTACT MD. 25 tablet 3  . pantoprazole (PROTONIX) 40 MG tablet Take 1 tablet (40 mg total) by mouth 2 (two) times daily. 60 tablet 1  . potassium chloride SA (K-DUR,KLOR-CON) 20 MEQ tablet Take 1 tablet (20 mEq total) by mouth daily. 30 tablet 3  . REPATHA SURECLICK 409 MG/ML SOAJ INJECT 1 PEN INTO SKIN EVERY 14 DAYS. 2 mL 11  . spironolactone (ALDACTONE) 25 MG tablet TAKE 1 TABLET BY MOUTH AT BEDTIME. 30 tablet 11  . torsemide (DEMADEX) 20 MG tablet Take 1 tablet (20 mg total) by mouth daily. 30 tablet 5  . warfarin (COUMADIN) 5 MG tablet Take coumadin 7.5mg  tonight then resume 5mg  daily (Patient taking differently: Take 5 mg by mouth every evening. ) 45 tablet 11  . acetaminophen (TYLENOL) 325 MG tablet Take 650 mg by mouth every 6 (six) hours as needed.    . benzonatate (TESSALON) 100 MG capsule Take 1 capsule (100 mg total) by mouth 3 (three) times daily as needed for cough. (Patient not taking: Reported on 11/12/2018) 15 capsule 0  . losartan (COZAAR) 25 MG tablet Take 12.5 mg by mouth daily.     No current  facility-administered medications for this encounter.     Allergies  Allergen Reactions  . Pork-Derived Products Other (See Comments)    Does not eat for religious reasons - okay with using IV heparin      Social History   Socioeconomic History  . Marital status: Single    Spouse name: Not on file  . Number of children: 0  . Years of education: 9  . Highest education level: 9th grade  Occupational History  .  Not on file  Social Needs  . Financial resource strain: Somewhat hard  . Food insecurity:    Worry: Never true    Inability: Sometimes true  . Transportation needs:    Medical: Yes    Non-medical: No  Tobacco Use  . Smoking status: Former Smoker    Packs/day: 0.50    Years: 20.00    Pack years: 10.00    Types: Cigarettes, Cigars    Start date: 07/21/1977    Last attempt to quit: 10/15/2017    Years since quitting: 1.2  . Smokeless tobacco: Never Used  Substance and Sexual Activity  . Alcohol use: Yes    Comment: 09/17/2017 "nothing since 05/2017"  . Drug use: No    Comment: previously incarcerated for drug related offense.  [name] Kitchen Sexual activity: Not Currently  Lifestyle  . Physical activity:    Days per week: Not on file    Minutes per session: Not on file  . Stress: Not on file  Relationships  . Social connections:    Talks on phone: Not on file    Gets together: Not on file    Attends religious service: Not on file    Active member of club or organization: Not on file    Attends meetings of clubs or organizations: Not on file    Relationship status: Not on file  . Intimate partner violence:    Fear of current or ex partner: Not on file    Emotionally abused: Not on file    Physically abused: Not on file    Forced sexual activity: Not on file  Other Topics Concern  . Not on file  Social History Narrative  . Not on file   Family History  Problem Relation Age of Onset  . Stroke Mother   . Heart attack Mother   . Heart attack Father   . Stroke  Sister   . Heart attack Sister   . Heart attack Brother   . Stroke Brother   . Liver disease Neg Hx   . Colon cancer Neg Hx    Vitals:   12/31/18 1023  BP: 102/78  Pulse: 75  SpO2: 95%  Weight: 78 kg (172 lb)   Wt Readings from Last 3 Encounters:  12/31/18 78 kg (172 lb)  10/01/18 81.4 kg (179 lb 6.4 oz)  09/20/18 82 kg (180 lb 12.4 oz)   PHYSICAL EXAM: General:  Well appearing. No resp difficulty HEENT: normal Neck: supple. no JVD. Carotids 2+ bilat; no bruits. No lymphadenopathy or thryomegaly appreciated. Cor: PMI nondisplaced. Regular rate & rhythm. No rubs, gallops or murmurs. Lungs: clear Abdomen: soft, nontender, nondistended. No hepatosplenomegaly. No bruits or masses. Good bowel sounds. Extremities: no cyanosis, clubbing, rash, edema Neuro: alert & orientedx3, cranial nerves grossly intact. moves all 4 extremities w/o difficulty. Affect pleasant  ASSESSMENT & PLAN:  1. CAD:  Extensive prior history of CAD. He has known total occlusion LCx and PLV.  NSTEMI 10/19 with DES to mid LAD.  No further chest pain.  - Continue coumadin with history of LV thrombus.   - Continue Plavix with recent stent. Add 30 mg imdur daily.  - No statin given history of rhabdomyolysis on statin. He is now on Repatha and Zetia -Referred to [facility] cardiac rehab. He will start in January 2nd, 2020.  2. Chronic systolic FYB:[id number] cardiomyopathy, St Jude ICD. Echo in 10/19 with EF 20-25%.   - NYHA II symptoms, confounded by lightheadedness  - Volume status stable  to dry on my exam, with BP in 90s.  - He is not on diuretics. ReDs vest 32% despite thoracic impedence depression on corevue.   - Continue Coreg 6.25 mg bid.  - Stop Entresto. He has not tolerated well with lightheadedness. Resume Losartan, but at increased dose of 25 mg qhs. BMET today.  - Continue spironolactone 25 mg daily.  - Continue digoxin 0.125, dig level 0.6 on 09/20/2018.  3. Diabetes:  - He is on glipizide, would  recommend switching to dapagliflozin or empagliflozin per PCP 4. Smoking: He has quit smoking.  5. PAD: No claudication, foot ulcers.   6. LV thrombus:  On coumadin as above. No bleeding problems.   7. 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  - On warfarin for LV thrombus.   Doing well overall. Med titration limited by lightheadedness. Has not tolerated Entresto. Labs today. RTC 6-8 weeks for further.   Shirley Friar, PA-C 12/31/18   Greater than 50% of the 25 minute visit was spent in counseling/coordination of care regarding disease state education, salt/fluid restriction, sliding scale diuretics, and medication compliance.

## 2018-12-31 NOTE — Progress Notes (Signed)
ReDS Vest - 12/31/18 1100      ReDS Vest   Estimated volume prior to reading  Low    Fitting Posture  Sitting    Height Marker  Short    Ruler Value  31    Center Strip  Aligned    ReDS Value  32    Anatomical Comments  station c

## 2018-12-31 NOTE — Patient Instructions (Signed)
Lab work done today. We will notify you of any abnormal lab work.  STOP Entresto   INCREASE Losartan 25mg  (1 tab) daily.  Follow up with Dr. Aundra Dubin in 2 months

## 2019-01-15 ENCOUNTER — Ambulatory Visit (INDEPENDENT_AMBULATORY_CARE_PROVIDER_SITE_OTHER): Payer: Medicaid Other | Admitting: *Deleted

## 2019-01-15 ENCOUNTER — Other Ambulatory Visit (HOSPITAL_COMMUNITY): Payer: Self-pay | Admitting: Cardiology

## 2019-01-15 ENCOUNTER — Other Ambulatory Visit: Payer: Self-pay

## 2019-01-15 DIAGNOSIS — I255 Ischemic cardiomyopathy: Secondary | ICD-10-CM

## 2019-01-15 DIAGNOSIS — I5022 Chronic systolic (congestive) heart failure: Secondary | ICD-10-CM

## 2019-01-15 LAB — CUP PACEART REMOTE DEVICE CHECK
Battery Remaining Longevity: 92 mo
Battery Voltage: 3.07 V
Date Time Interrogation Session: 20200319080925
HIGH POWER IMPEDANCE MEASURED VALUE: 65 Ohm
HIGH POWER IMPEDANCE MEASURED VALUE: 65 Ohm
Lead Channel Impedance Value: 340 Ohm
Lead Channel Sensing Intrinsic Amplitude: 11.6 mV
Lead Channel Setting Pacing Amplitude: 2.5 V
MDC IDC LEAD IMPLANT DT: 20181120
MDC IDC LEAD LOCATION: 753860
MDC IDC MSMT BATTERY REMAINING PERCENTAGE: 87 %
MDC IDC MSMT LEADCHNL RV PACING THRESHOLD AMPLITUDE: 0.75 V
MDC IDC MSMT LEADCHNL RV PACING THRESHOLD PULSEWIDTH: 0.5 ms
MDC IDC PG IMPLANT DT: 20181120
MDC IDC PG SERIAL: 9786940
MDC IDC SET LEADCHNL RV PACING PULSEWIDTH: 0.5 ms
MDC IDC SET LEADCHNL RV SENSING SENSITIVITY: 0.5 mV
MDC IDC STAT BRADY RV PERCENT PACED: 1 %

## 2019-01-22 NOTE — Progress Notes (Signed)
Remote ICD transmission.   

## 2019-01-26 ENCOUNTER — Other Ambulatory Visit: Payer: Self-pay

## 2019-01-26 ENCOUNTER — Ambulatory Visit (INDEPENDENT_AMBULATORY_CARE_PROVIDER_SITE_OTHER): Payer: Medicaid Other

## 2019-01-26 DIAGNOSIS — I5022 Chronic systolic (congestive) heart failure: Secondary | ICD-10-CM | POA: Diagnosis not present

## 2019-01-26 DIAGNOSIS — Z95810 Presence of automatic (implantable) cardiac defibrillator: Secondary | ICD-10-CM

## 2019-01-28 ENCOUNTER — Telehealth: Payer: Self-pay

## 2019-01-28 NOTE — Progress Notes (Signed)
EPIC Encounter for ICM Monitoring  Patient Name: Daniel Li is a 63 y.o. male Date: 01/28/2019 Primary Care Physican: [name], NP Primary Cardiologist:Branch/ [facility] HF Clinic Electrophysiologist:Allred 01/28/2019 Weight:170.2 lbs  Heart Failure questions reviewed and patient asymptomatic.   Thoracic impedance normal.  Prescribed dosage:Torsemide20 mgTake1 tablet (20mg total)by mouth daily.  Labs: 12/31/2018 Creatinine 1.23, BUN 9,   Potassium 4.5, Sodium 136, GFR >60 12/04/2019Creatinine 1.33, BUN14, Potassium4.2, Sodium131, GFR57->60 11/23/2019Creatinine 1.46, BUN16, Potassium4.0, Sodium133, GFR50-58  11/05/2019Creatinine 1.49, BUN15, Potassium4.3, Sodium130, CHY85-02  10/29/2019Creatinine 1.29, BUN14, Potassium4.2, Sodium136, GFR58->60  08/22/2018 Creatinine1.12, BUN14, Potassium3.9, Sodium135, GFR>60  07/30/2018 Creatinine1.25, BUN10, Potassium3.9, Sodium136, GFR>60  07/21/2018 Creatinine1.34, BUN15, Potassium3.9, Sodium137, GFR55->60  07/09/2018 Creatinine1.24, BUN9,   Potassium3.7, Sodium139, GFR>60  06/16/2018 Creatinine0.98, BUN12, Potassium3.5, Sodium140, GFR>60 05/27/2018 Creatinine1.05, BUN16, Potassium4.3, Sodium133, GFR>60 05/26/2018 Creatinine1.18, BUN26, Potassium3.1, Sodium125, GFR>60  A complete set of results can be found in Results Review.  Recommendations: No changes and encouraged to call for fluid symptoms.  Follow-up plan: ICM clinic phone appointment on5/01/2019. Virtual office visit with Dr Rayann Heman 01/30/2019.  Copy of ICM check sent to Dr.Allred.  3 month ICM trend: 01/26/2019    1 Year ICM trend:       Rosalene Billings, RN 01/28/2019 9:56 AM

## 2019-01-28 NOTE — Telephone Encounter (Signed)
Spoke with pt regarding virtual visit on 01/30/19. Pt stated he does not have access to MyChart. Pt stated he would check pulse, BP, and send a transmission. Pt was advise to keep televist appt.

## 2019-01-30 ENCOUNTER — Telehealth (INDEPENDENT_AMBULATORY_CARE_PROVIDER_SITE_OTHER): Payer: Medicaid Other | Admitting: Internal Medicine

## 2019-01-30 DIAGNOSIS — Z95810 Presence of automatic (implantable) cardiac defibrillator: Secondary | ICD-10-CM

## 2019-01-30 DIAGNOSIS — Z720 Tobacco use: Secondary | ICD-10-CM | POA: Diagnosis not present

## 2019-01-30 DIAGNOSIS — I255 Ischemic cardiomyopathy: Secondary | ICD-10-CM

## 2019-01-30 DIAGNOSIS — Z955 Presence of coronary angioplasty implant and graft: Secondary | ICD-10-CM | POA: Diagnosis not present
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
## 2019-03-02 ENCOUNTER — Other Ambulatory Visit: Payer: Self-pay

## 2019-03-02 ENCOUNTER — Ambulatory Visit (INDEPENDENT_AMBULATORY_CARE_PROVIDER_SITE_OTHER): Payer: Medicaid Other

## 2019-03-02 DIAGNOSIS — I5022 Chronic systolic (congestive) heart failure: Secondary | ICD-10-CM | POA: Diagnosis not present

## 2019-03-02 DIAGNOSIS — Z95810 Presence of automatic (implantable) cardiac defibrillator: Secondary | ICD-10-CM

## 2019-03-03 ENCOUNTER — Encounter (HOSPITAL_COMMUNITY): Payer: Medicaid Other | Admitting: Cardiology

## 2019-03-03 NOTE — Progress Notes (Signed)
EPIC Encounter for ICM Monitoring  Patient Name: Daniel Li is a 63 y.o. male Date: 03/03/2019 Primary Care Physican: [name], NP Primary Cardiologist:Branch/ [facility] HF Clinic Electrophysiologist:Allred 01/28/2019 Weight:170.2 lbs  Transmission reviewed  Thoracic impedance normal.  Prescribed dosage:Torsemide20 mgTake1 tablet (20mg total)by mouth daily.  Labs: 12/31/2018 Creatinine 1.23, BUN 9,   Potassium 4.5, Sodium 136, GFR >60 12/04/2019Creatinine 1.33, BUN14, Potassium4.2, Sodium131, GFR57->60 11/23/2019Creatinine 1.46, BUN16, Potassium4.0, Sodium133, GFR50-58  A complete set of results can be found in Results Review.  Recommendations: No changes.  Follow-up plan: ICM clinic phone appointment on6/05/2019. Virtual visit 03/25/2019 with HF clinic.  Copy of ICM check sent to Dr.Allred.  3 month ICM trend: 03/02/2019    1 Year ICM trend:       Rosalene Billings, RN 03/03/2019 12:41 PM

## 2019-03-25 ENCOUNTER — Other Ambulatory Visit: Payer: Self-pay

## 2019-03-25 ENCOUNTER — Ambulatory Visit (HOSPITAL_COMMUNITY): Admission: RE | Admit: 2019-03-25 | Payer: Medicaid Other | Source: Ambulatory Visit

## 2019-04-06 ENCOUNTER — Ambulatory Visit (INDEPENDENT_AMBULATORY_CARE_PROVIDER_SITE_OTHER): Payer: Medicaid Other

## 2019-04-06 DIAGNOSIS — I5022 Chronic systolic (congestive) heart failure: Secondary | ICD-10-CM | POA: Diagnosis not present

## 2019-04-06 DIAGNOSIS — Z95810 Presence of automatic (implantable) cardiac defibrillator: Secondary | ICD-10-CM | POA: Diagnosis not present

## 2019-04-07 ENCOUNTER — Telehealth: Payer: Self-pay

## 2019-04-07 NOTE — Progress Notes (Signed)
EPIC Encounter for ICM Monitoring  Patient Name: Daniel Li is a 63 y.o. male Date: 04/07/2019 Primary Care Physican: [name], NP Primary Cardiologist:Branch/ [facility] Electrophysiologist:Allred 4/1/2020Weight:170.2lbs  Attempted call to patient and unable to reach. Transmission reviewed.   Thoracic impedance normal.  Prescribed dosage:Torsemide20 mgTake1 tablet (20mg total)by mouth daily.  Labs: 12/31/2018 Creatinine 1.23, BUN 9, Potassium 4.5, Sodium 136, GFR >60 12/04/2019Creatinine 1.33, BUN14, Potassium4.2, Sodium131, GFR57->60 11/23/2019Creatinine 1.46, BUN16, Potassium4.0, Sodium133, GFR50-58  A complete set of results can be found in Results Review.  Recommendations: Unable to reach.    Follow-up plan: ICM clinic phone appointment on7/13/2020.   Copy of ICM check sent to Dr.Allred.  3 month ICM trend: 04/06/2019    1 Year ICM trend:       Rosalene Billings, RN 04/07/2019 11:32 AM

## 2019-04-07 NOTE — Telephone Encounter (Signed)
Remote ICM transmission received.  Attempted call to patient at [place] facility and line busy.

## 2019-04-15 ENCOUNTER — Other Ambulatory Visit: Payer: Self-pay | Admitting: Internal Medicine

## 2019-04-15 ENCOUNTER — Other Ambulatory Visit (HOSPITAL_COMMUNITY): Payer: Self-pay | Admitting: Adult Health

## 2019-04-16 ENCOUNTER — Telehealth (HOSPITAL_COMMUNITY): Payer: Self-pay

## 2019-04-16 ENCOUNTER — Ambulatory Visit (INDEPENDENT_AMBULATORY_CARE_PROVIDER_SITE_OTHER): Payer: Medicaid Other | Admitting: *Deleted

## 2019-04-16 DIAGNOSIS — I5022 Chronic systolic (congestive) heart failure: Secondary | ICD-10-CM

## 2019-04-16 DIAGNOSIS — I255 Ischemic cardiomyopathy: Secondary | ICD-10-CM

## 2019-04-16 LAB — CUP PACEART REMOTE DEVICE CHECK
Battery Remaining Longevity: 90 mo
Battery Remaining Percentage: 85 %
Battery Voltage: 3.05 V
Brady Statistic RV Percent Paced: 1 %
Date Time Interrogation Session: 20200618060018
HighPow Impedance: 71 Ohm
HighPow Impedance: 71 Ohm
Implantable Lead Implant Date: 20181120
Implantable Lead Location: 753860
Implantable Pulse Generator Implant Date: 20181120
Lead Channel Impedance Value: 340 Ohm
Lead Channel Sensing Intrinsic Amplitude: 11.6 mV
Lead Channel Setting Pacing Amplitude: 2.5 V
Lead Channel Setting Pacing Pulse Width: 0.5 ms
Lead Channel Setting Sensing Sensitivity: 0.5 mV
Pulse Gen Serial Number: 9786940

## 2019-04-16 NOTE — Telephone Encounter (Signed)
PA for [place] submitted via [employer].

## 2019-04-20 NOTE — Progress Notes (Signed)
Remote ICD transmission.   

## 2019-05-11 ENCOUNTER — Ambulatory Visit (INDEPENDENT_AMBULATORY_CARE_PROVIDER_SITE_OTHER): Payer: Medicaid Other

## 2019-05-11 DIAGNOSIS — Z95810 Presence of automatic (implantable) cardiac defibrillator: Secondary | ICD-10-CM | POA: Diagnosis not present

## 2019-05-11 DIAGNOSIS — I5022 Chronic systolic (congestive) heart failure: Secondary | ICD-10-CM | POA: Diagnosis not present

## 2019-05-12 NOTE — Progress Notes (Signed)
EPIC Encounter for ICM Monitoring  Patient Name: Arlind Klingerman Cragle is a 63 y.o. male Date: 05/12/2019 Primary Care Physican: [name], NP Primary Cardiologist:Branch/ [facility] Electrophysiologist:Allred 4/1/2020Weight:170.2lbs  Attempted call to patient and unable to reach. Transmission reviewed.   CorVue thoracic impedance suggestive of possible fluid accumulation since 05/03/2019 but trending close to baseline.  Prescribed dosage:Torsemide20 mgTake1 tablet (20mg total)by mouth daily.  Labs: 12/31/2018 Creatinine 1.23, BUN 9, Potassium 4.5, Sodium 136, GFR >60 12/04/2019Creatinine 1.33, BUN14, Potassium4.2, Sodium131, GFR57->60 11/23/2019Creatinine 1.46, BUN16, Potassium4.0, Sodium133, GFR50-58  A complete set of results can be found in Results Review.  Recommendations: Unable to reach.    Follow-up plan: ICM clinic phone appointment on7/22/2020 to recheck fluid levels.   Copy of ICM check sent to Dr.Allred.   3 month ICM trend: 05/11/2019    1 Year ICM trend:       Rosalene Billings, RN 05/12/2019 9:36 AM

## 2019-05-13 ENCOUNTER — Telehealth: Payer: Self-pay

## 2019-05-13 NOTE — Telephone Encounter (Signed)
Remote ICM transmission received.  Attempted call to patient regarding ICM remote transmission and no answer.  

## 2019-05-20 ENCOUNTER — Ambulatory Visit (INDEPENDENT_AMBULATORY_CARE_PROVIDER_SITE_OTHER): Payer: Medicaid Other

## 2019-05-20 DIAGNOSIS — I5022 Chronic systolic (congestive) heart failure: Secondary | ICD-10-CM

## 2019-05-20 DIAGNOSIS — Z95810 Presence of automatic (implantable) cardiac defibrillator: Secondary | ICD-10-CM

## 2019-05-22 NOTE — Progress Notes (Signed)
EPIC Encounter for ICM Monitoring  Patient Name: Daniel Li is a 63 y.o. male Date: 05/22/2019 Primary Care Physican: [name], NP Primary Cardiologist:Branch/ [facility] Electrophysiologist:Allred LastWeight:170.2lbs  Transmission reviewed.  CorVue thoracic impedance returned to normal since 05/11/2019 transmission.  Prescribed dosage:Torsemide20 mgTake1 tablet (20mg total)by mouth daily.  Labs: 12/31/2018 Creatinine 1.23, BUN 9, Potassium 4.5, Sodium 136, GFR >60 12/04/2019Creatinine 1.33, BUN14, Potassium4.2, Sodium131, GFR57->60 11/23/2019Creatinine 1.46, BUN16, Potassium4.0, Sodium133, GFR50-58  A complete set of results can be found in Results Review.  Recommendations: None  Follow-up plan: ICM clinic phone appointment on8/24/2020.   Copy of ICM check sent to Dr.Allred.   3 month ICM trend: 05/20/2019    1 Year ICM trend:       Rosalene Billings, RN 05/22/2019 4:12 PM

## 2019-06-09 ENCOUNTER — Other Ambulatory Visit: Payer: Self-pay | Admitting: Internal Medicine

## 2019-06-09 ENCOUNTER — Other Ambulatory Visit (HOSPITAL_COMMUNITY): Payer: Self-pay | Admitting: Adult Health

## 2019-06-22 ENCOUNTER — Ambulatory Visit (INDEPENDENT_AMBULATORY_CARE_PROVIDER_SITE_OTHER): Payer: Medicaid Other

## 2019-06-22 DIAGNOSIS — I5022 Chronic systolic (congestive) heart failure: Secondary | ICD-10-CM

## 2019-06-22 DIAGNOSIS — Z95810 Presence of automatic (implantable) cardiac defibrillator: Secondary | ICD-10-CM

## 2019-06-22 NOTE — Progress Notes (Signed)
EPIC Encounter for ICM Monitoring  Patient Name: Daniel Li is a 63 y.o. male Date: 06/22/2019 Primary Care Physican: [name], NP Primary Cardiologist:Branch/ [facility] Electrophysiologist:Allred LastWeight:170.2lbs  Attempted call to patient and unable to reach. Patient is in assisted living facility.  CorVue thoracic impedance suggesting possible fluid accumulation since 06/20/2019.  Prescribed dosage:Torsemide20 mgTake1 tablet (20mg total)by mouth daily.Potassium 20 mEq take 1 tablet daily.  Labs: 12/31/2018 Creatinine 1.23, BUN 9, Potassium 4.5, Sodium 136, GFR >60 A complete set of results can be found in Results Review.  Recommendations: Unable to reach.   Follow-up plan: ICM clinic phone appointment on9/10/2018 to recheck fluid levels.   Copy of ICM check sent to Dr.Allred.   3 month ICM trend: 06/22/2019    1 Year ICM trend:       Rosalene Billings, RN 06/22/2019 12:01 PM

## 2019-06-23 ENCOUNTER — Telehealth: Payer: Self-pay

## 2019-06-23 NOTE — Telephone Encounter (Signed)
Remote ICM transmission received.  Attempted call to patient at assisted living facility regarding ICM remote transmission and no answer.

## 2019-06-30 ENCOUNTER — Ambulatory Visit (INDEPENDENT_AMBULATORY_CARE_PROVIDER_SITE_OTHER): Payer: Medicaid Other

## 2019-06-30 DIAGNOSIS — Z95810 Presence of automatic (implantable) cardiac defibrillator: Secondary | ICD-10-CM

## 2019-06-30 DIAGNOSIS — I5022 Chronic systolic (congestive) heart failure: Secondary | ICD-10-CM

## 2019-07-03 NOTE — Progress Notes (Signed)
EPIC Encounter for ICM Monitoring  Patient Name: Daniel Li is a 63 y.o. male Date: 07/03/2019 Primary Care Physican: [name], NP Primary Cardiologist:Branch/ [facility] Electrophysiologist:Allred LastWeight:170.2lbs  Transmission reviewed.  CorVue thoracic impedancereturned to normal since 06/22/2019 transmission.   Prescribed dosage:Torsemide20 mgTake1 tablet (20mg total)by mouth daily.Potassium 20 mEq take 1 tablet daily.  Labs: 12/31/2018 Creatinine 1.23, BUN 9, Potassium 4.5, Sodium 136, GFR >60 A complete set of results can be found in Results Review.  Recommendations: None  Follow-up plan: ICM clinic phone appointment on10/26/2020.  Copy of ICM check sent to Dr.Allred.   3 month ICM trend: 06/30/2019    1 Year ICM trend:       Rosalene Billings, RN 07/03/2019 12:55 PM

## 2019-07-13 ENCOUNTER — Other Ambulatory Visit (HOSPITAL_COMMUNITY): Payer: Self-pay | Admitting: Cardiology

## 2019-07-20 ENCOUNTER — Other Ambulatory Visit: Payer: Self-pay | Admitting: Student

## 2019-07-20 ENCOUNTER — Ambulatory Visit (INDEPENDENT_AMBULATORY_CARE_PROVIDER_SITE_OTHER): Payer: Medicaid Other | Admitting: *Deleted

## 2019-07-20 DIAGNOSIS — I255 Ischemic cardiomyopathy: Secondary | ICD-10-CM

## 2019-07-21 LAB — CUP PACEART REMOTE DEVICE CHECK
Battery Remaining Longevity: 88 mo
Battery Remaining Percentage: 83 %
Battery Voltage: 3.04 V
Brady Statistic RV Percent Paced: 1 %
Date Time Interrogation Session: 20200921060017
HighPow Impedance: 63 Ohm
HighPow Impedance: 63 Ohm
Implantable Lead Implant Date: 20181120
Implantable Lead Location: 753860
Implantable Pulse Generator Implant Date: 20181120
Lead Channel Impedance Value: 340 Ohm
Lead Channel Pacing Threshold Amplitude: 0.75 V
Lead Channel Pacing Threshold Pulse Width: 0.5 ms
Lead Channel Sensing Intrinsic Amplitude: 9.7 mV
Lead Channel Setting Pacing Amplitude: 2.5 V
Lead Channel Setting Pacing Pulse Width: 0.5 ms
Lead Channel Setting Sensing Sensitivity: 0.5 mV
Pulse Gen Serial Number: 9786940

## 2019-07-22 ENCOUNTER — Other Ambulatory Visit: Payer: Self-pay | Admitting: Student

## 2019-07-27 ENCOUNTER — Encounter: Payer: Self-pay | Admitting: Cardiology

## 2019-07-27 NOTE — Progress Notes (Signed)
Remote ICD transmission.   

## 2019-08-12 ENCOUNTER — Other Ambulatory Visit (HOSPITAL_COMMUNITY): Payer: Self-pay | Admitting: Adult Health

## 2019-08-12 ENCOUNTER — Other Ambulatory Visit: Payer: Self-pay | Admitting: Cardiology

## 2019-08-12 ENCOUNTER — Other Ambulatory Visit: Payer: Self-pay | Admitting: Student

## 2019-08-13 NOTE — Telephone Encounter (Signed)
This is Dr. Branch pt °

## 2019-08-18 ENCOUNTER — Other Ambulatory Visit: Payer: Self-pay

## 2019-08-18 ENCOUNTER — Ambulatory Visit (INDEPENDENT_AMBULATORY_CARE_PROVIDER_SITE_OTHER): Payer: Medicaid Other | Admitting: *Deleted

## 2019-08-18 DIAGNOSIS — Z5181 Encounter for therapeutic drug level monitoring: Secondary | ICD-10-CM | POA: Diagnosis not present

## 2019-08-18 DIAGNOSIS — I236 Thrombosis of atrium, auricular appendage, and ventricle as current complications following acute myocardial infarction: Secondary | ICD-10-CM

## 2019-08-18 LAB — POCT INR: INR: 2.1 (ref 2.0–3.0)

## 2019-08-18 NOTE — Patient Instructions (Signed)
Continue coumadin 1 tablet daily.   Recheck in 6 weeks Orders sent to [facility]

## 2019-08-24 ENCOUNTER — Ambulatory Visit (INDEPENDENT_AMBULATORY_CARE_PROVIDER_SITE_OTHER): Payer: Medicaid Other

## 2019-08-24 DIAGNOSIS — Z95810 Presence of automatic (implantable) cardiac defibrillator: Secondary | ICD-10-CM

## 2019-08-24 DIAGNOSIS — I5022 Chronic systolic (congestive) heart failure: Secondary | ICD-10-CM

## 2019-08-24 NOTE — Progress Notes (Signed)
EPIC Encounter for ICM Monitoring  Patient Name: Daniel Li is a 63 y.o. male Date: 08/24/2019 Primary Care Physican: [name], NP Primary Cardiologist:Branch/ [facility] Electrophysiologist:Allred LastWeight:170.2lbs  Transmission reviewed.  CorVue thoracic impedancenormal.  Prescribed dosage:Torsemide20 mgTake1 tablet (20mg total)by mouth daily.Potassium 20 mEq take 1 tablet daily.  Labs: 12/31/2018 Creatinine 1.23, BUN 9, Potassium 4.5, Sodium 136, GFR >60 A complete set of results can be found in Results Review.  Recommendations: None.  Follow-up plan: ICM clinic phone appointment on 10/05/2019.   91 day device clinic remote transmission 10/19/2019.    Copy of ICM check sent to Dr. Rayann Heman.   3 month ICM trend: 08/24/2019    1 Year ICM trend:       Rosalene Billings, RN 08/24/2019 4:12 PM

## 2019-09-29 ENCOUNTER — Ambulatory Visit (INDEPENDENT_AMBULATORY_CARE_PROVIDER_SITE_OTHER): Payer: Medicaid Other | Admitting: *Deleted

## 2019-09-29 ENCOUNTER — Other Ambulatory Visit: Payer: Self-pay

## 2019-09-29 ENCOUNTER — Other Ambulatory Visit: Payer: Self-pay | Admitting: Cardiology

## 2019-09-29 DIAGNOSIS — Z5181 Encounter for therapeutic drug level monitoring: Secondary | ICD-10-CM

## 2019-09-29 DIAGNOSIS — I236 Thrombosis of atrium, auricular appendage, and ventricle as current complications following acute myocardial infarction: Secondary | ICD-10-CM

## 2019-09-29 LAB — POCT INR: INR: 1.6 — AB (ref 2.0–3.0)

## 2019-09-29 MED ORDER — WARFARIN SODIUM 5 MG PO TABS: ORAL_TABLET | ORAL | 1 refills | Status: DC

## 2019-09-29 NOTE — Patient Instructions (Signed)
Take warfarin 2 tablet tonight, 1 1/2 tablets tomorrow night then resume 1 tablet daily.   Recheck in 3 weeks Orders sent to [facility]

## 2019-10-02 ENCOUNTER — Other Ambulatory Visit (HOSPITAL_COMMUNITY): Payer: Self-pay | Admitting: Adult Health

## 2019-10-05 ENCOUNTER — Ambulatory Visit (INDEPENDENT_AMBULATORY_CARE_PROVIDER_SITE_OTHER): Payer: Medicaid Other

## 2019-10-05 DIAGNOSIS — Z95810 Presence of automatic (implantable) cardiac defibrillator: Secondary | ICD-10-CM | POA: Diagnosis not present

## 2019-10-05 DIAGNOSIS — I5022 Chronic systolic (congestive) heart failure: Secondary | ICD-10-CM | POA: Diagnosis not present

## 2019-10-07 NOTE — Progress Notes (Signed)
EPIC Encounter for ICM Monitoring  Patient Name: Daniel Li is a 63 y.o. male Date: 10/07/2019 Primary Care Physican: [name], NP Primary Cardiologist:Branch/ [facility] Electrophysiologist:Allred LastWeight:165-169lbs  Spoke with patient and weight has fluctuated by 3 pounds in the last week.  He is not sure what meds he takes and spoke with his nurse, Lelon Frohlich at the facility.   She confirmed he takes 20 mg Torsemide daily and can extra if needed.  CorVue thoracic impedancesuggesting possible fluid accumulation since 09/30/2019 but trending back toward baseline.  Prescribed dosage:Torsemide20 mgtake1 tablet (20mg total)by mouth daily. May take extra tablet for weight gain as needed. Potassium 20 mEq take 1 tablet daily.  Labs: 12/31/2018 Creatinine 1.23, BUN 9, Potassium 4.5, Sodium 136, GFR >60 A complete set of results can be found in Results Review.  Recommendations:  Advised the nurse, Lelon Frohlich, to have patient take 1 extra Torsemide 20 mg a day for 2 days only and then return to taking Torsemide 20 mg 1 tablet daily.  She repeated instructions back correctly.  Follow-up plan: ICM clinic phone appointment on 10/19/2019 to recheck fluid levels.   91 day device clinic remote transmission 10/19/2019.    Copy of ICM check sent to Dr. Rayann Heman.   3 month ICM trend: 10/05/2019    1 Year ICM trend:       Rosalene Billings, RN 10/07/2019 8:15 AM

## 2019-10-19 ENCOUNTER — Ambulatory Visit (INDEPENDENT_AMBULATORY_CARE_PROVIDER_SITE_OTHER): Payer: Medicaid Other | Admitting: *Deleted

## 2019-10-19 DIAGNOSIS — I255 Ischemic cardiomyopathy: Secondary | ICD-10-CM | POA: Diagnosis not present

## 2019-10-20 ENCOUNTER — Ambulatory Visit (INDEPENDENT_AMBULATORY_CARE_PROVIDER_SITE_OTHER): Payer: Medicaid Other

## 2019-10-20 DIAGNOSIS — Z95810 Presence of automatic (implantable) cardiac defibrillator: Secondary | ICD-10-CM

## 2019-10-20 DIAGNOSIS — I5022 Chronic systolic (congestive) heart failure: Secondary | ICD-10-CM

## 2019-10-20 LAB — CUP PACEART REMOTE DEVICE CHECK
Battery Remaining Longevity: 85 mo
Battery Remaining Percentage: 81 %
Battery Voltage: 3.02 V
Brady Statistic RV Percent Paced: 1 %
Date Time Interrogation Session: 20201222004952
HighPow Impedance: 66 Ohm
HighPow Impedance: 66 Ohm
Implantable Lead Implant Date: 20181120
Implantable Lead Location: 753860
Implantable Pulse Generator Implant Date: 20181120
Lead Channel Impedance Value: 340 Ohm
Lead Channel Pacing Threshold Amplitude: 0.75 V
Lead Channel Pacing Threshold Pulse Width: 0.5 ms
Lead Channel Sensing Intrinsic Amplitude: 11.9 mV
Lead Channel Setting Pacing Amplitude: 2.5 V
Lead Channel Setting Pacing Pulse Width: 0.5 ms
Lead Channel Setting Sensing Sensitivity: 0.5 mV
Pulse Gen Serial Number: 9786940

## 2019-10-20 NOTE — Progress Notes (Signed)
EPIC Encounter for ICM Monitoring  Patient Name: Daniel Li is a 63 y.o. male Date: 10/20/2019 Primary Care Physican: [name], NP Primary Cardiologist:Branch/ [facility] Electrophysiologist:Allred LastWeight:165-169lbs  Transmission reviewed.   CorVue thoracic impedancereturned to normal after taking extra Torsemide.  Prescribed dosage:Torsemide20 mgtake1 tablet (20mg total)by mouth daily. May take extra tablet for weight gain as needed. Potassium 20 mEq take 1 tablet daily.  Labs: 12/31/2018 Creatinine 1.23, BUN 9, Potassium 4.5, Sodium 136, GFR >60 A complete set of results can be found in Results Review.  Recommendations:   None  Follow-up plan: ICM clinic phone appointment on 11/23/2019.   91 day device clinic remote transmission 01/18/2020.    Copy of ICM check sent to Dr. Rayann Heman.   3 month ICM trend: 10/20/2019    1 Year ICM trend:       Rosalene Billings, RN 10/20/2019 4:31 PM

## 2019-10-24 ENCOUNTER — Other Ambulatory Visit (HOSPITAL_COMMUNITY): Payer: Self-pay | Admitting: Cardiology

## 2019-11-01 IMAGING — DX DG CERVICAL SPINE COMPLETE 4+V
6 series · 6 of 6 positions shown · non-contrast
Comparison: Cervical spine CT 02/28/2015.

CLINICAL DATA: 61-year-old male with left-sided neck pain extending
to left shoulder and left arm for the past month. No reported
injury. Initial encounter.

EXAM:
CERVICAL SPINE - COMPLETE 4+ VIEW

[c-spine lat]
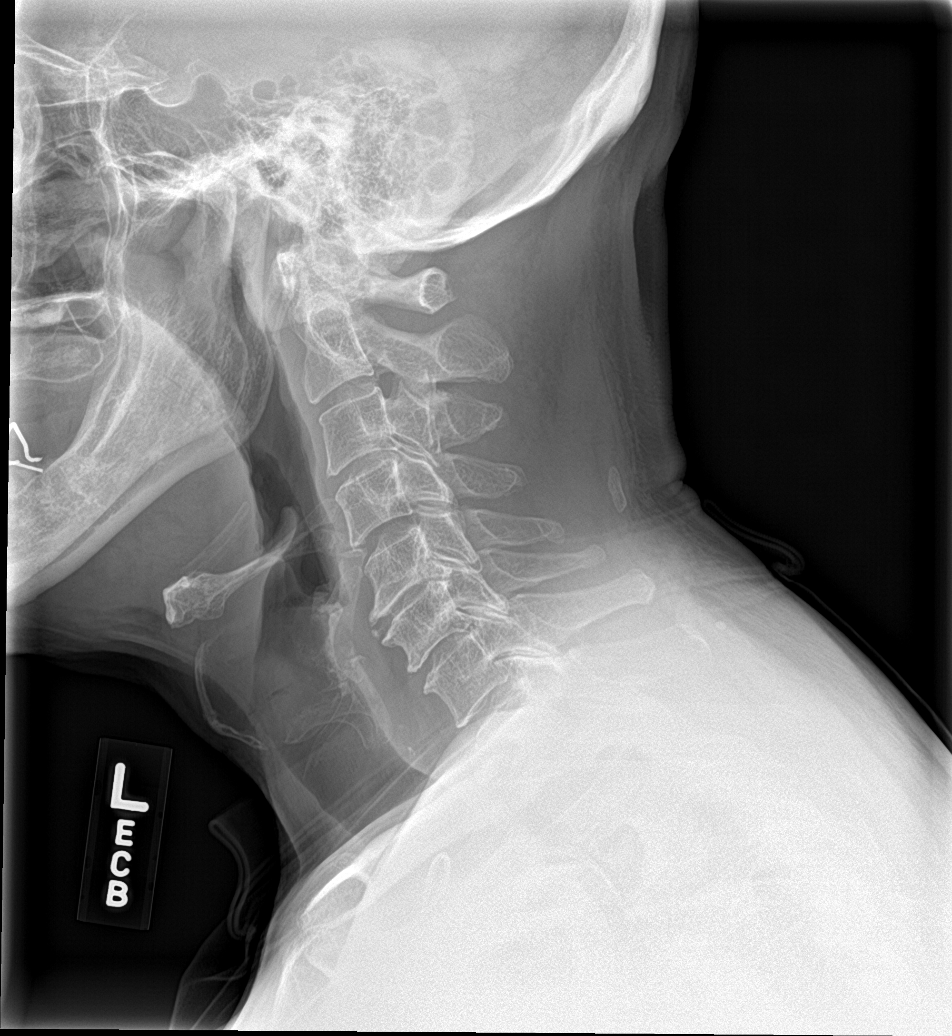

[c-spine obl (1 of 2)]
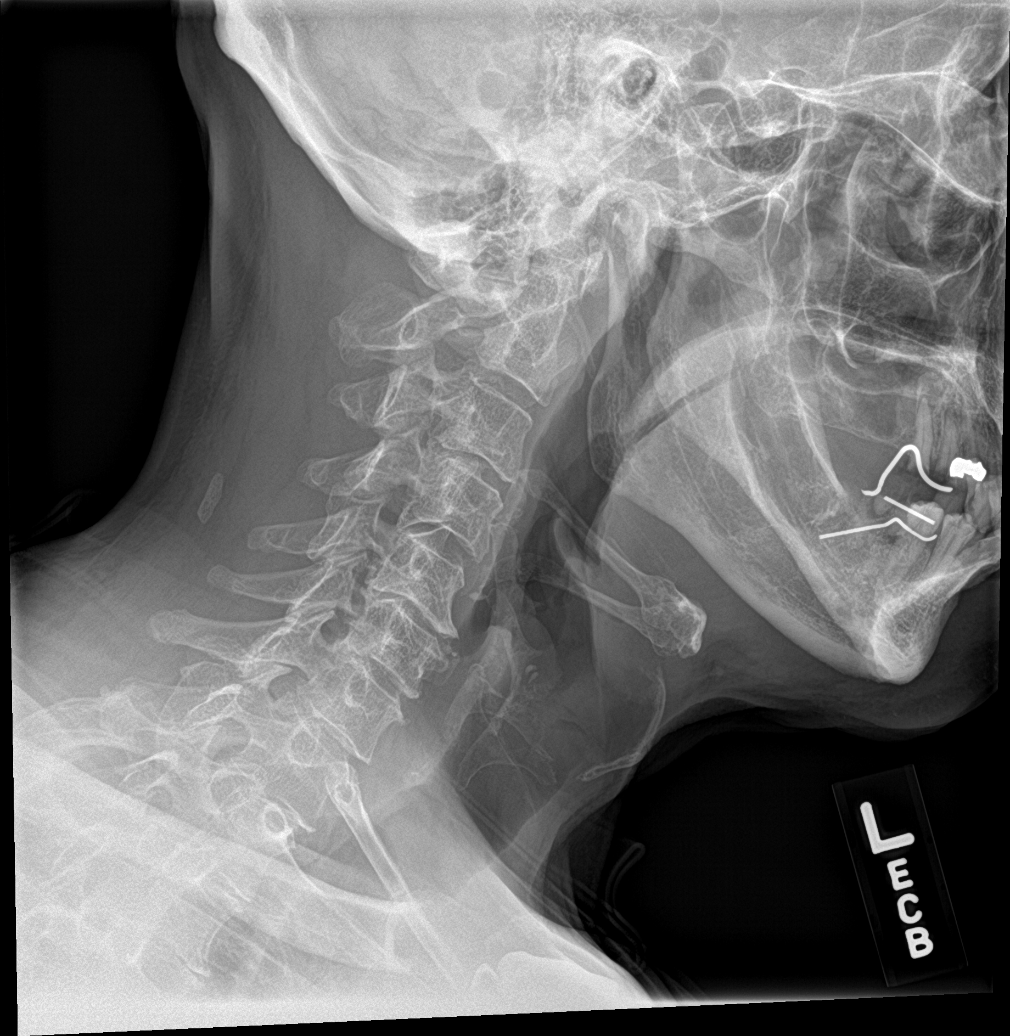

[c-spine obl (2 of 2)]
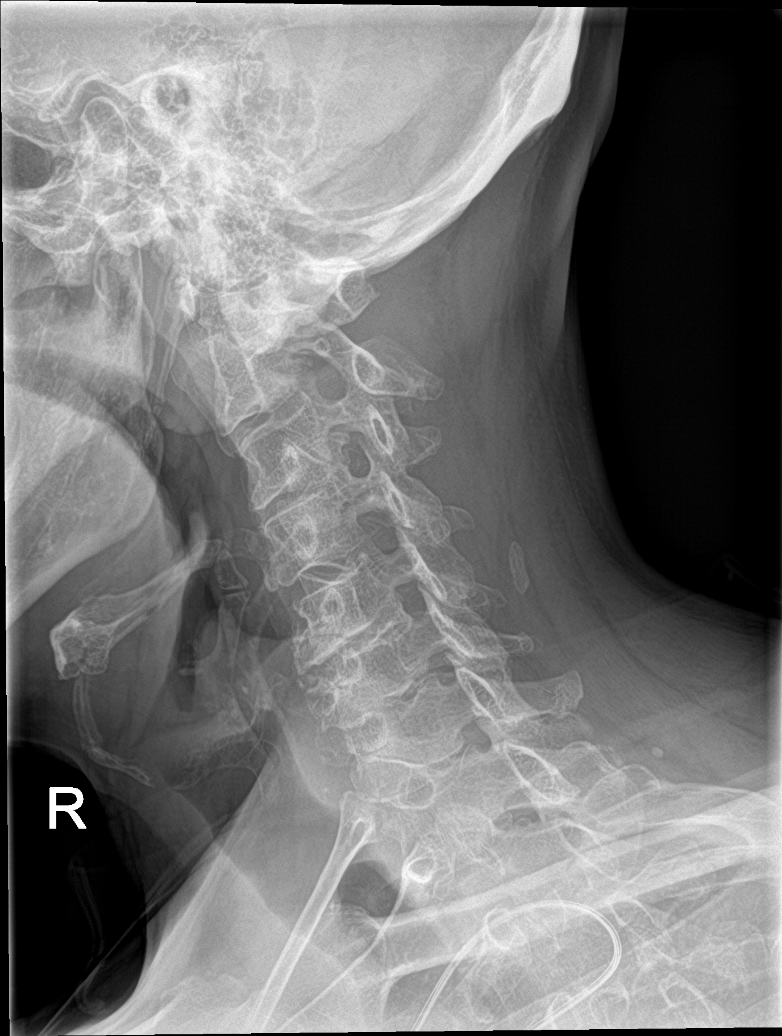

[c-spine ap]
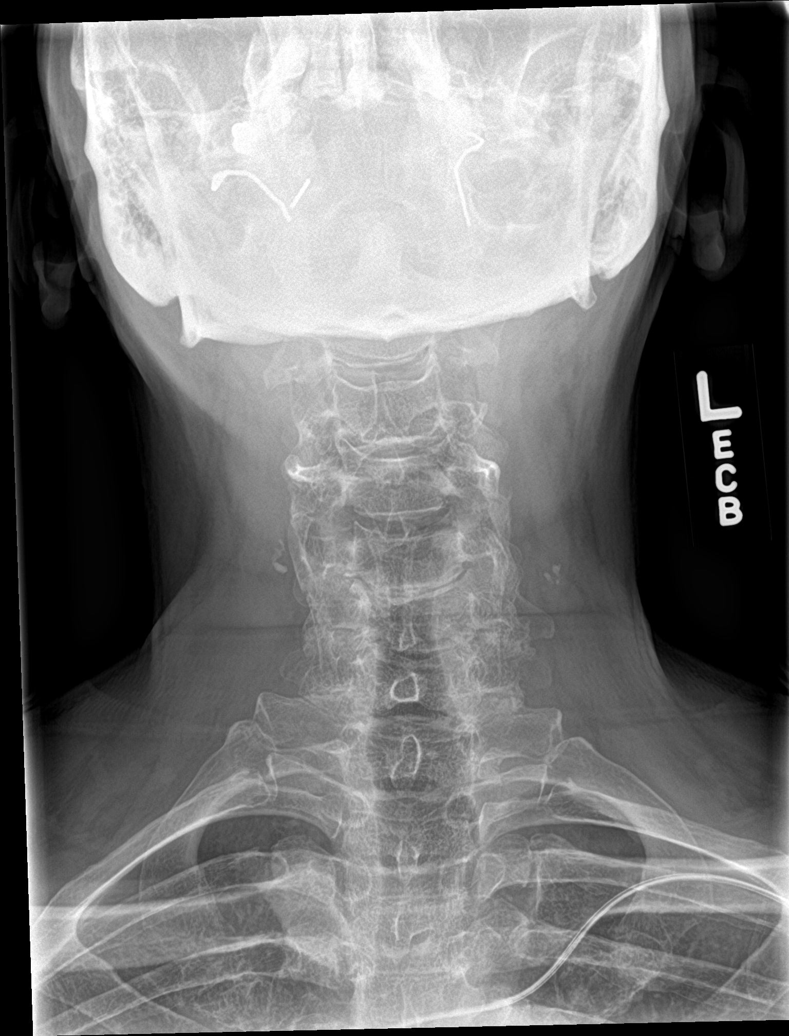

[c-spine open mouth]
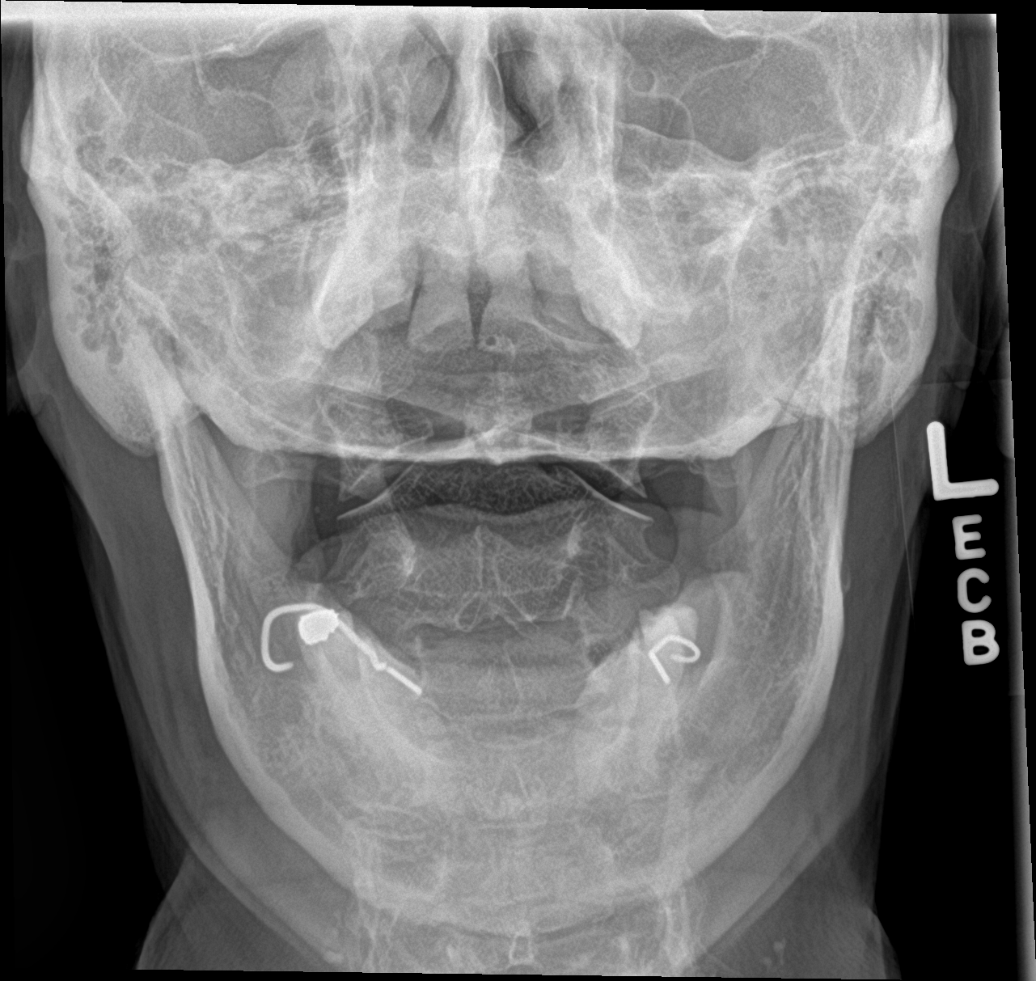

[[person_name]]
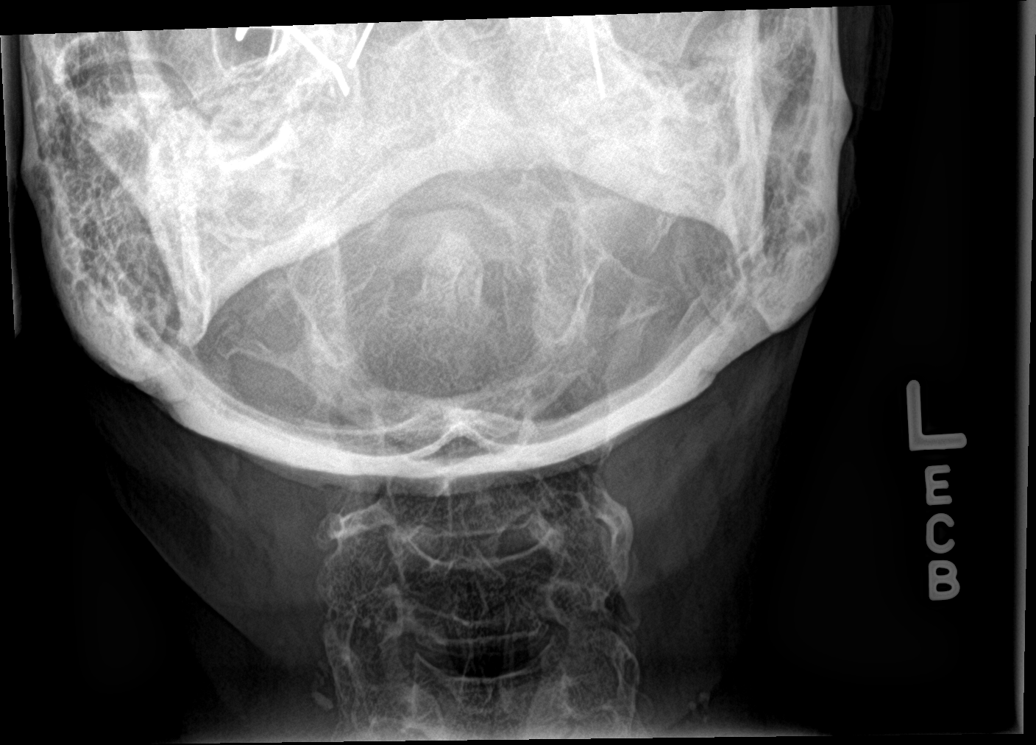

[6 of 6 positions shown; findings below may reference images not displayed]

FINDINGS: Normal alignment cervical spine without fracture or abnormal
prevertebral soft tissue swelling.

Moderate C5-6 disc space narrowing with broad-based osteophyte and
bilateral foraminal narrowing greater on the right.

Mild C6-7 disc space narrowing and bony spur with minimal bilateral
foraminal narrowing.

Bilateral C3-4 foraminal narrowing greater on the right.

No lung apical mass.  Pacemaker lead partially visualized.

Bilateral carotid bifurcation calcifications.
IMPRESSION: Moderate C5-6 disc space narrowing with broad-based osteophyte and
bilateral foraminal narrowing greater on the right.

Mild C6-7 disc space narrowing and bony spur with minimal bilateral
foraminal narrowing.

Bilateral C3-4 foraminal narrowing greater on the right.

Bilateral carotid bifurcation calcifications.

## 2019-11-04 ENCOUNTER — Other Ambulatory Visit: Payer: Self-pay | Admitting: Cardiology

## 2019-11-04 ENCOUNTER — Other Ambulatory Visit (HOSPITAL_COMMUNITY): Payer: Self-pay | Admitting: Adult Health

## 2019-11-04 ENCOUNTER — Other Ambulatory Visit (HOSPITAL_COMMUNITY): Payer: Self-pay | Admitting: Cardiology

## 2019-11-05 ENCOUNTER — Telehealth (HOSPITAL_COMMUNITY): Payer: Self-pay

## 2019-11-05 ENCOUNTER — Ambulatory Visit (INDEPENDENT_AMBULATORY_CARE_PROVIDER_SITE_OTHER): Payer: Medicaid Other | Admitting: *Deleted

## 2019-11-05 ENCOUNTER — Other Ambulatory Visit: Payer: Self-pay

## 2019-11-05 ENCOUNTER — Other Ambulatory Visit (HOSPITAL_COMMUNITY): Payer: Self-pay

## 2019-11-05 DIAGNOSIS — Z5181 Encounter for therapeutic drug level monitoring: Secondary | ICD-10-CM

## 2019-11-05 DIAGNOSIS — I236 Thrombosis of atrium, auricular appendage, and ventricle as current complications following acute myocardial infarction: Secondary | ICD-10-CM | POA: Diagnosis not present

## 2019-11-05 LAB — POCT INR: INR: 1.5 — AB (ref 2.0–3.0)

## 2019-11-05 MED ORDER — SPIRONOLACTONE 25 MG PO TABS: 25.0000 mg | ORAL_TABLET | Freq: Every day | ORAL | 11 refills | Status: DC

## 2019-11-05 MED ORDER — WARFARIN SODIUM 5 MG PO TABS: ORAL_TABLET | ORAL | 3 refills | Status: DC

## 2019-11-05 NOTE — Telephone Encounter (Signed)
Refill already addressed at [facility] visit today.

## 2019-11-05 NOTE — Patient Instructions (Signed)
Take warfarin 2 tablet tonight then increase dose to 1 tablet daily except 1 1/2 tablet on Tuesdays, Thursdays and Saturdays   Recheck in 2 weeks Orders sent to [facility]

## 2019-11-05 NOTE — Telephone Encounter (Signed)
Sorry about that...[name] KitchenMarland KitchenPatient requested refills on his warfarin please.

## 2019-11-17 ENCOUNTER — Ambulatory Visit (INDEPENDENT_AMBULATORY_CARE_PROVIDER_SITE_OTHER): Payer: Medicaid Other | Admitting: *Deleted

## 2019-11-17 ENCOUNTER — Other Ambulatory Visit: Payer: Self-pay

## 2019-11-17 DIAGNOSIS — Z5181 Encounter for therapeutic drug level monitoring: Secondary | ICD-10-CM

## 2019-11-17 DIAGNOSIS — I236 Thrombosis of atrium, auricular appendage, and ventricle as current complications following acute myocardial infarction: Secondary | ICD-10-CM | POA: Diagnosis not present

## 2019-11-17 LAB — POCT INR: INR: 1.9 — AB (ref 2.0–3.0)

## 2019-11-17 MED ORDER — WARFARIN SODIUM 5 MG PO TABS: ORAL_TABLET | ORAL | 3 refills | Status: DC

## 2019-11-17 NOTE — Patient Instructions (Signed)
Increase warfarin to 1 1/2 tablets daily except 1 tablet on Mondays, Wednesdays and Fridays Recheck in 3 weeks Orders sent to [facility]

## 2019-11-23 ENCOUNTER — Ambulatory Visit (INDEPENDENT_AMBULATORY_CARE_PROVIDER_SITE_OTHER): Payer: Medicaid Other

## 2019-11-23 DIAGNOSIS — I5022 Chronic systolic (congestive) heart failure: Secondary | ICD-10-CM | POA: Diagnosis not present

## 2019-11-23 DIAGNOSIS — Z95810 Presence of automatic (implantable) cardiac defibrillator: Secondary | ICD-10-CM

## 2019-11-25 NOTE — Progress Notes (Signed)
EPIC Encounter for ICM Monitoring  Patient Name: Daniel Li is a 64 y.o. male Date: 11/25/2019 Primary Care Physican: [name], NP Primary Cardiologist:Branch/ [facility] Electrophysiologist:Allred LastWeight:165-169lbs  Transmission reviewed.   CorVue thoracic impedancenormal.  Prescribed dosage:  Torsemide20 mgtake1 tablet (20mg total)by mouth daily.May take extra tablet for weight gain as needed.  Potassium 20 mEq take 1 tablet daily.  Labs: 12/31/2018 Creatinine 1.23, BUN 9, Potassium 4.5, Sodium 136, GFR >60 A complete set of results can be found in Results Review.  Recommendations:   None  Follow-up plan: ICM clinic phone appointment on 12/28/2019.   91 day device clinic remote transmission 01/18/2020.    Copy of ICM check sent to Dr. Rayann Heman.   3 month ICM trend: 11/23/2019    1 Year ICM trend:       Rosalene Billings, RN 11/25/2019 2:46 PM

## 2019-11-30 ENCOUNTER — Other Ambulatory Visit (HOSPITAL_COMMUNITY): Payer: Self-pay | Admitting: Cardiology

## 2019-11-30 ENCOUNTER — Other Ambulatory Visit (HOSPITAL_COMMUNITY): Payer: Self-pay | Admitting: Adult Health

## 2019-11-30 ENCOUNTER — Other Ambulatory Visit (HOSPITAL_COMMUNITY): Payer: Self-pay | Admitting: Student

## 2019-12-28 ENCOUNTER — Ambulatory Visit (INDEPENDENT_AMBULATORY_CARE_PROVIDER_SITE_OTHER): Payer: Medicaid Other

## 2019-12-28 DIAGNOSIS — Z95810 Presence of automatic (implantable) cardiac defibrillator: Secondary | ICD-10-CM

## 2019-12-28 DIAGNOSIS — I5022 Chronic systolic (congestive) heart failure: Secondary | ICD-10-CM

## 2019-12-29 ENCOUNTER — Telehealth: Payer: Self-pay

## 2019-12-29 NOTE — Telephone Encounter (Signed)
Left message for patient to remind of missed remote transmission.  

## 2019-12-30 ENCOUNTER — Ambulatory Visit (INDEPENDENT_AMBULATORY_CARE_PROVIDER_SITE_OTHER): Payer: Medicaid Other | Admitting: *Deleted

## 2019-12-30 ENCOUNTER — Other Ambulatory Visit: Payer: Self-pay

## 2019-12-30 DIAGNOSIS — Z5181 Encounter for therapeutic drug level monitoring: Secondary | ICD-10-CM

## 2019-12-30 DIAGNOSIS — I236 Thrombosis of atrium, auricular appendage, and ventricle as current complications following acute myocardial infarction: Secondary | ICD-10-CM

## 2019-12-30 LAB — POCT INR: INR: 1.3 — AB (ref 2.0–3.0)

## 2019-12-30 MED ORDER — WARFARIN SODIUM 5 MG PO TABS: ORAL_TABLET | ORAL | 3 refills | Status: DC

## 2019-12-30 NOTE — Patient Instructions (Signed)
Take warfarin 2 tablets tonight and tomorrow night then increase dose to 1 1/2 tablets daily except 1 tablet on Mondays Recheck in 2 weeks Orders sent to [facility] [place]

## 2020-01-01 NOTE — Progress Notes (Signed)
EPIC Encounter for ICM Monitoring  Patient Name: Daniel Li is a 64 y.o. male Date: 01/01/2020 Primary Care Physican: [name], NP Primary Care Physican: [name], NP Primary Cardiologist:Branch/ [facility] Electrophysiologist:Allred LastWeight:165-169lbs  Transmission reviewed.  CorVue thoracic impedancenormal.  Prescribed dosage:  Torsemide20 mgtake1 tablet (20mg total)by mouth daily.May take extra tablet for weight gain as needed.  Potassium 20 mEq take 1 tablet daily.  Labs: 12/31/2018 Creatinine 1.23, BUN 9, Potassium 4.5, Sodium 136, GFR >60 A complete set of results can be found in Results Review.  Recommendations:None  Follow-up plan: ICM clinic phone appointment on 02/01/2020. 91 day device clinic remote transmission 01/18/2020.   Copy of ICM check sent to Dr.Allred.   3 month ICM trend: 01/01/2020    1 Year ICM trend:       Rosalene Billings, RN 01/01/2020 5:10 PM

## 2020-01-18 ENCOUNTER — Ambulatory Visit (INDEPENDENT_AMBULATORY_CARE_PROVIDER_SITE_OTHER): Payer: Medicaid Other | Admitting: *Deleted

## 2020-01-18 DIAGNOSIS — I255 Ischemic cardiomyopathy: Secondary | ICD-10-CM | POA: Diagnosis not present

## 2020-01-18 LAB — CUP PACEART REMOTE DEVICE CHECK
Battery Remaining Longevity: 83 mo
Battery Remaining Percentage: 79 %
Battery Voltage: 3.01 V
Brady Statistic RV Percent Paced: 1 %
Date Time Interrogation Session: 20210322092726
HighPow Impedance: 69 Ohm
HighPow Impedance: 69 Ohm
Implantable Lead Implant Date: 20181120
Implantable Lead Location: 753860
Implantable Pulse Generator Implant Date: 20181120
Lead Channel Impedance Value: 330 Ohm
Lead Channel Pacing Threshold Amplitude: 0.75 V
Lead Channel Pacing Threshold Pulse Width: 0.5 ms
Lead Channel Sensing Intrinsic Amplitude: 12 mV
Lead Channel Setting Pacing Amplitude: 2.5 V
Lead Channel Setting Pacing Pulse Width: 0.5 ms
Lead Channel Setting Sensing Sensitivity: 0.5 mV
Pulse Gen Serial Number: 9786940

## 2020-01-18 NOTE — Progress Notes (Signed)
ICD Remote  

## 2020-01-21 ENCOUNTER — Other Ambulatory Visit: Payer: Self-pay

## 2020-01-21 ENCOUNTER — Ambulatory Visit (INDEPENDENT_AMBULATORY_CARE_PROVIDER_SITE_OTHER): Payer: Medicaid Other | Admitting: *Deleted

## 2020-01-21 DIAGNOSIS — Z5181 Encounter for therapeutic drug level monitoring: Secondary | ICD-10-CM | POA: Diagnosis not present

## 2020-01-21 DIAGNOSIS — I236 Thrombosis of atrium, auricular appendage, and ventricle as current complications following acute myocardial infarction: Secondary | ICD-10-CM | POA: Diagnosis not present

## 2020-01-21 LAB — POCT INR: INR: 3.4 — AB (ref 2.0–3.0)

## 2020-01-21 MED ORDER — WARFARIN SODIUM 5 MG PO TABS: ORAL_TABLET | ORAL | 3 refills | Status: DC

## 2020-01-21 NOTE — Patient Instructions (Signed)
Take warfarin 1/2 tablet tonight then resume 1 1/2 tablets daily except 1 tablet on Mondays Recheck in 3 weeks Orders sent to [facility] and [place]

## 2020-01-29 ENCOUNTER — Encounter: Payer: Medicaid Other | Admitting: Internal Medicine

## 2020-02-05 NOTE — Progress Notes (Signed)
No ICM remote transmission received for 01/31/2020 and next ICM transmission scheduled for 02/15/2020.

## 2020-02-15 ENCOUNTER — Ambulatory Visit (INDEPENDENT_AMBULATORY_CARE_PROVIDER_SITE_OTHER): Payer: Medicaid Other

## 2020-02-15 ENCOUNTER — Ambulatory Visit (INDEPENDENT_AMBULATORY_CARE_PROVIDER_SITE_OTHER): Payer: Medicaid Other | Admitting: *Deleted

## 2020-02-15 ENCOUNTER — Other Ambulatory Visit: Payer: Self-pay

## 2020-02-15 DIAGNOSIS — I5022 Chronic systolic (congestive) heart failure: Secondary | ICD-10-CM

## 2020-02-15 DIAGNOSIS — I236 Thrombosis of atrium, auricular appendage, and ventricle as current complications following acute myocardial infarction: Secondary | ICD-10-CM

## 2020-02-15 DIAGNOSIS — Z95810 Presence of automatic (implantable) cardiac defibrillator: Secondary | ICD-10-CM | POA: Diagnosis not present

## 2020-02-15 DIAGNOSIS — Z5181 Encounter for therapeutic drug level monitoring: Secondary | ICD-10-CM

## 2020-02-15 LAB — POCT INR: INR: 3.9 — AB (ref 2.0–3.0)

## 2020-02-15 MED ORDER — WARFARIN SODIUM 5 MG PO TABS: ORAL_TABLET | ORAL | 3 refills | Status: DC

## 2020-02-15 NOTE — Patient Instructions (Signed)
Hold warfarin tonight then decrease dose to 1 tablet daily except 1 1/2 tablets on Mondays, Wednesdays and Fridays Recheck in 3 weeks Orders sent to [facility]

## 2020-02-17 NOTE — Progress Notes (Signed)
EPIC Encounter for ICM Monitoring  Patient Name: Daniel Li is a 64 y.o. male Date: 02/17/2020 Primary Care Physican: [name], NP Primary Cardiologist:Branch/ [facility] Electrophysiologist:Allred LastWeight:165-169lbs  Transmission reviewed.  CorVue thoracic impedancenormal.  Prescribed dosage:  Torsemide20 mgtake1 tablet (20mg total)by mouth daily.  Potassium 20 mEq take 1 tablet daily.  Labs: 12/31/2018 Creatinine 1.23, BUN 9, Potassium 4.5, Sodium 136, GFR >60 A complete set of results can be found in Results Review.  Recommendations: None  Follow-up plan: ICM clinic phone appointment on 03/21/2020. 91 day device clinic remote transmission 04/18/2020.   Copy of ICM check sent to Dr.Allred.  3 month ICM trend: 02/15/2020    1 Year ICM trend:       Rosalene Billings, RN 02/17/2020 9:16 AM

## 2020-03-04 ENCOUNTER — Telehealth: Payer: Self-pay

## 2020-03-04 ENCOUNTER — Telehealth: Payer: Medicaid Other | Admitting: Internal Medicine

## 2020-03-07 ENCOUNTER — Other Ambulatory Visit: Payer: Self-pay

## 2020-03-07 ENCOUNTER — Encounter: Payer: Self-pay | Admitting: Internal Medicine

## 2020-03-07 ENCOUNTER — Telehealth (INDEPENDENT_AMBULATORY_CARE_PROVIDER_SITE_OTHER): Payer: Medicaid Other | Admitting: Internal Medicine

## 2020-03-07 ENCOUNTER — Ambulatory Visit (INDEPENDENT_AMBULATORY_CARE_PROVIDER_SITE_OTHER): Payer: Medicaid Other | Admitting: *Deleted

## 2020-03-07 VITALS — Ht 66.0 in | Wt 159.8 lb

## 2020-03-07 DIAGNOSIS — I2510 Atherosclerotic heart disease of native coronary artery without angina pectoris: Secondary | ICD-10-CM

## 2020-03-07 DIAGNOSIS — Z5181 Encounter for therapeutic drug level monitoring: Secondary | ICD-10-CM | POA: Diagnosis not present

## 2020-03-07 DIAGNOSIS — I255 Ischemic cardiomyopathy: Secondary | ICD-10-CM | POA: Diagnosis not present

## 2020-03-07 DIAGNOSIS — I236 Thrombosis of atrium, auricular appendage, and ventricle as current complications following acute myocardial infarction: Secondary | ICD-10-CM

## 2020-03-07 DIAGNOSIS — I5022 Chronic systolic (congestive) heart failure: Secondary | ICD-10-CM

## 2020-03-07 DIAGNOSIS — D6869 Other thrombophilia: Secondary | ICD-10-CM | POA: Diagnosis not present

## 2020-03-07 DIAGNOSIS — Z720 Tobacco use: Secondary | ICD-10-CM

## 2020-03-07 LAB — POCT INR: INR: 1.8 — AB (ref 2.0–3.0)

## 2020-03-07 MED ORDER — WARFARIN SODIUM 5 MG PO TABS: ORAL_TABLET | ORAL | 3 refills | Status: DC

## 2020-03-07 NOTE — Patient Instructions (Signed)
Take 2 tablets tonight then increase dose to 1 1/2 tablets daily except 1 tablet on Tuesdays and Fridays Recheck in 3 weeks Orders sent to [facility]
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
## 2020-03-21 ENCOUNTER — Ambulatory Visit (INDEPENDENT_AMBULATORY_CARE_PROVIDER_SITE_OTHER): Payer: Medicaid Other

## 2020-03-21 DIAGNOSIS — Z95810 Presence of automatic (implantable) cardiac defibrillator: Secondary | ICD-10-CM | POA: Diagnosis not present

## 2020-03-21 DIAGNOSIS — I5022 Chronic systolic (congestive) heart failure: Secondary | ICD-10-CM

## 2020-03-22 ENCOUNTER — Telehealth: Payer: Self-pay

## 2020-03-22 NOTE — Telephone Encounter (Signed)
Spoke with patient to remind of missed remote transmission 

## 2020-03-25 NOTE — Progress Notes (Signed)
EPIC Encounter for ICM Monitoring  Patient Name: Daniel Li is a 64 y.o. male Date: 03/25/2020 Primary Care Physican: [name], NP Primary Cardiologist:[facility] Electrophysiologist:Allred LastWeight:165-169lbs  Transmission reviewed.  CorVue thoracic impedancenormal.  Prescribed dosage:  Torsemide20 mgtake1 tablet (20mg total)by mouth daily.  Potassium 20 mEq take 1 tablet daily.  Labs: 12/31/2018 Creatinine 1.23, BUN 9, Potassium 4.5, Sodium 136, GFR >60 A complete set of results can be found in Results Review.  Recommendations: None  Follow-up plan: ICM clinic phone appointment on6/28/2021. 91 day device clinic remote transmission 04/18/2020. OV with Dr Aundra Dubin on 04/21/2020.  Copy of ICM check sent to Dr.Allred.  3 month ICM trend: 03/22/2020    1 Year ICM trend:       Rosalene Billings, RN 03/25/2020 12:21 PM

## 2020-03-29 ENCOUNTER — Encounter: Payer: Self-pay | Admitting: Internal Medicine

## 2020-03-31 ENCOUNTER — Other Ambulatory Visit: Payer: Self-pay

## 2020-03-31 ENCOUNTER — Ambulatory Visit (INDEPENDENT_AMBULATORY_CARE_PROVIDER_SITE_OTHER): Payer: Medicaid Other | Admitting: *Deleted

## 2020-03-31 DIAGNOSIS — I236 Thrombosis of atrium, auricular appendage, and ventricle as current complications following acute myocardial infarction: Secondary | ICD-10-CM

## 2020-03-31 DIAGNOSIS — Z5181 Encounter for therapeutic drug level monitoring: Secondary | ICD-10-CM | POA: Diagnosis not present

## 2020-03-31 LAB — POCT INR: INR: 2.4 (ref 2.0–3.0)

## 2020-03-31 NOTE — Patient Instructions (Signed)
Continue warfarin 1 1/2 tablets daily except 1 tablet on Tuesdays and Fridays Recheck in 4 weeks Orders sent to [facility]

## 2020-04-18 ENCOUNTER — Ambulatory Visit (INDEPENDENT_AMBULATORY_CARE_PROVIDER_SITE_OTHER): Payer: Medicaid Other | Admitting: *Deleted

## 2020-04-18 DIAGNOSIS — I255 Ischemic cardiomyopathy: Secondary | ICD-10-CM

## 2020-04-18 LAB — CUP PACEART REMOTE DEVICE CHECK
Battery Remaining Longevity: 80 mo
Battery Remaining Percentage: 77 %
Battery Voltage: 2.99 V
Brady Statistic RV Percent Paced: 1 %
Date Time Interrogation Session: 20210621020017
HighPow Impedance: 61 Ohm
HighPow Impedance: 61 Ohm
Implantable Lead Implant Date: 20181120
Implantable Lead Location: 753860
Implantable Pulse Generator Implant Date: 20181120
Lead Channel Impedance Value: 330 Ohm
Lead Channel Pacing Threshold Amplitude: 0.75 V
Lead Channel Pacing Threshold Pulse Width: 0.5 ms
Lead Channel Sensing Intrinsic Amplitude: 11.6 mV
Lead Channel Setting Pacing Amplitude: 2.5 V
Lead Channel Setting Pacing Pulse Width: 0.5 ms
Lead Channel Setting Sensing Sensitivity: 0.5 mV
Pulse Gen Serial Number: 9786940

## 2020-04-18 NOTE — Progress Notes (Signed)
Remote ICD transmission.   

## 2020-04-21 ENCOUNTER — Encounter (HOSPITAL_COMMUNITY): Payer: Medicaid Other | Admitting: Cardiology

## 2020-04-28 ENCOUNTER — Other Ambulatory Visit: Payer: Self-pay

## 2020-04-28 ENCOUNTER — Ambulatory Visit (INDEPENDENT_AMBULATORY_CARE_PROVIDER_SITE_OTHER): Payer: Medicaid Other | Admitting: *Deleted

## 2020-04-28 DIAGNOSIS — I236 Thrombosis of atrium, auricular appendage, and ventricle as current complications following acute myocardial infarction: Secondary | ICD-10-CM | POA: Diagnosis not present

## 2020-04-28 DIAGNOSIS — Z5181 Encounter for therapeutic drug level monitoring: Secondary | ICD-10-CM | POA: Diagnosis not present

## 2020-04-28 LAB — POCT INR: INR: 4.2 — AB (ref 2.0–3.0)

## 2020-04-28 MED ORDER — WARFARIN SODIUM 5 MG PO TABS: ORAL_TABLET | ORAL | 3 refills | Status: DC

## 2020-04-28 NOTE — Patient Instructions (Signed)
Hold warfarin tonight, take 1/2 tablet Friday night then resume 1 1/2 tablets daily except 1 tablet on Tuesdays and Fridays Recheck in 3 weeks Orders sent to [facility]

## 2020-04-29 NOTE — Progress Notes (Signed)
No ICM remote transmission received for 04/25/2020 and next ICM transmission scheduled for 05/23/2020.   

## 2020-05-19 ENCOUNTER — Ambulatory Visit (INDEPENDENT_AMBULATORY_CARE_PROVIDER_SITE_OTHER): Payer: Medicaid Other | Admitting: *Deleted

## 2020-05-19 DIAGNOSIS — I236 Thrombosis of atrium, auricular appendage, and ventricle as current complications following acute myocardial infarction: Secondary | ICD-10-CM | POA: Diagnosis not present

## 2020-05-19 DIAGNOSIS — Z5181 Encounter for therapeutic drug level monitoring: Secondary | ICD-10-CM

## 2020-05-19 LAB — POCT INR: INR: 5.3 — AB (ref 2.0–3.0)

## 2020-05-19 MED ORDER — WARFARIN SODIUM 5 MG PO TABS: ORAL_TABLET | ORAL | 3 refills | Status: DC

## 2020-05-19 NOTE — Patient Instructions (Addendum)
Hold warfarin tonight and tomorrow night then decrease dose to 1 tablet daily except 1 1/2 tablets on Sundays and Thursdays Denies any bleeding Recheck in 2 weeks Bleeding and fall precautions discussed with pt and he verbalized understanding.

## 2020-05-23 ENCOUNTER — Ambulatory Visit (INDEPENDENT_AMBULATORY_CARE_PROVIDER_SITE_OTHER): Payer: Medicaid Other

## 2020-05-23 DIAGNOSIS — Z95810 Presence of automatic (implantable) cardiac defibrillator: Secondary | ICD-10-CM

## 2020-05-23 DIAGNOSIS — I5022 Chronic systolic (congestive) heart failure: Secondary | ICD-10-CM | POA: Diagnosis not present

## 2020-05-27 ENCOUNTER — Telehealth: Payer: Self-pay

## 2020-05-27 NOTE — Progress Notes (Signed)
EPIC Encounter for ICM Monitoring  Patient Name: Daniel Li is a 64 y.o. male Date: 05/27/2020 Primary Care Physican: [name], NP Primary Cardiologist:[facility]/ [facility] Electrophysiologist:Allred LastWeight:165-169lbs  Attempted call to patient and unable to reach.  Left message to return call. Transmission reviewed.   CorVue thoracic impedancenormal.  Prescribed dosage:  Torsemide20 mgtake1 tablet (20mg total)by mouth daily.  Potassium 20 mEq take 1 tablet daily.  Labs: 12/31/2018 Creatinine 1.23, BUN 9, Potassium 4.5, Sodium 136, GFR >60 A complete set of results can be found in Results Review.  Recommendations: Unable to reach.    Follow-up plan: ICM clinic phone appointment on 06/01/2020 to recheck fluid levels.   Copy of ICM check sent to Dr. Rayann Heman and Dr Aundra Dubin.   3 month ICM trend: 05/24/2020    1 Year ICM trend:       Rosalene Billings, RN 05/27/2020 3:34 PM

## 2020-05-27 NOTE — Telephone Encounter (Signed)
Remote ICM transmission received.  Attempted call to patient regarding ICM remote transmission and person answering phone stated patient was not available.

## 2020-06-01 ENCOUNTER — Ambulatory Visit (INDEPENDENT_AMBULATORY_CARE_PROVIDER_SITE_OTHER): Payer: Medicaid Other | Admitting: *Deleted

## 2020-06-01 DIAGNOSIS — I236 Thrombosis of atrium, auricular appendage, and ventricle as current complications following acute myocardial infarction: Secondary | ICD-10-CM

## 2020-06-01 DIAGNOSIS — Z5181 Encounter for therapeutic drug level monitoring: Secondary | ICD-10-CM

## 2020-06-01 LAB — POCT INR: INR: 2.6 (ref 2.0–3.0)

## 2020-06-01 NOTE — Patient Instructions (Signed)
Continue warfarin 1 tablet daily except 1 1/2 tablets on Sundays and Thursdays. Recheck in 4 weeks. *No charge remote INRs, charge [phone] if in person d/t traditional Medicaid* Orders sent to [facility] and [place]

## 2020-06-07 NOTE — Progress Notes (Signed)
No ICM remote transmission received for 05/30/2020 and next ICM transmission scheduled for 06/27/2020.

## 2020-06-27 ENCOUNTER — Ambulatory Visit (INDEPENDENT_AMBULATORY_CARE_PROVIDER_SITE_OTHER): Payer: Medicaid Other

## 2020-06-27 DIAGNOSIS — I5022 Chronic systolic (congestive) heart failure: Secondary | ICD-10-CM | POA: Diagnosis not present

## 2020-06-27 DIAGNOSIS — Z95810 Presence of automatic (implantable) cardiac defibrillator: Secondary | ICD-10-CM

## 2020-06-29 ENCOUNTER — Telehealth: Payer: Self-pay

## 2020-06-29 NOTE — Progress Notes (Signed)
EPIC Encounter for ICM Monitoring  Patient Name: Daniel Li is a 64 y.o. male Date: 06/29/2020 Primary Care Physican: [name], NP Primary Cardiologist:[facility]/ [facility] Electrophysiologist:Allred LastWeight:165-169lbs  Attempted call to patient and unable to reach.  Transmission reviewed.   CorVue thoracic impedance suggesting possible fluid accumulation starting 06/23/2020.  Prescribed dosage:  Torsemide20 mgtake1 tablet (20mg total)by mouth daily.  Potassium 20 mEq take 1 tablet daily.  Labs: 12/31/2018 Creatinine 1.23, BUN 9, Potassium 4.5, Sodium 136, GFR >60 A complete set of results can be found in Results Review.  Recommendations:  Unable to reach.    Follow-up plan: ICM clinic phone appointment on 07/05/2020 (manual) to recheck fluid levels. 91 day device clinic remote transmission 07/18/2020.    EP/Cardiology Office Visits: 07/13/2020 with Dr. Aundra Dubin.  Copy of ICM check sent to Dr. Rayann Heman and Dr Aundra Dubin.  3 month ICM trend: 06/28/2020    1 Year ICM trend:       Rosalene Billings, RN 06/29/2020 10:22 AM

## 2020-06-29 NOTE — Telephone Encounter (Signed)
Remote ICM transmission received.  Attempted call to patient regarding ICM remote transmission and person answering phone stated he was not available.  

## 2020-06-30 ENCOUNTER — Other Ambulatory Visit: Payer: Self-pay

## 2020-06-30 ENCOUNTER — Telehealth: Payer: Self-pay | Admitting: Cardiology

## 2020-06-30 ENCOUNTER — Ambulatory Visit (INDEPENDENT_AMBULATORY_CARE_PROVIDER_SITE_OTHER): Payer: Medicaid Other | Admitting: Pharmacist

## 2020-06-30 DIAGNOSIS — Z5181 Encounter for therapeutic drug level monitoring: Secondary | ICD-10-CM

## 2020-06-30 DIAGNOSIS — I236 Thrombosis of atrium, auricular appendage, and ventricle as current complications following acute myocardial infarction: Secondary | ICD-10-CM | POA: Diagnosis not present

## 2020-06-30 LAB — POCT INR: INR: 3.2 — AB (ref 2.0–3.0)

## 2020-06-30 MED ORDER — WARFARIN SODIUM 5 MG PO TABS: ORAL_TABLET | ORAL | 1 refills | Status: DC

## 2020-06-30 NOTE — Telephone Encounter (Signed)
New message   Patient using new pharmacy in [place]   *STAT* If patient is at the pharmacy, call can be transferred to refill team.   1. Which medications need to be refilled? (please list name of each medication and dose if known) warfarin (COUMADIN) 5 MG tablet 2. Which pharmacy/location (including street and city if local pharmacy) is medication to be sent to? [facility] [place]   3. Do they need a 30 day or 90 day supply?  [place]

## 2020-06-30 NOTE — Patient Instructions (Addendum)
Description   Increase your greens tonight then continue warfarin 1 tablet daily except 1 1/2 tablets on Sundays and Thursdays. Recheck in 4 weeks. *No charge remote INRs, charge [phone] if in person d/t traditional Medicaid* Orders sent to [facility] and [place]

## 2020-07-06 NOTE — Progress Notes (Signed)
No ICM remote transmission received for 07/05/2020 and next ICM transmission scheduled for 08/01/2020.

## 2020-07-13 ENCOUNTER — Other Ambulatory Visit: Payer: Self-pay

## 2020-07-13 ENCOUNTER — Ambulatory Visit (HOSPITAL_COMMUNITY)
Admission: RE | Admit: 2020-07-13 | Discharge: 2020-07-13 | Disposition: A | Discharge status: Home / Self Care | Payer: Medicaid Other | Source: Ambulatory Visit | Attending: Cardiology | Admitting: Cardiology

## 2020-07-13 ENCOUNTER — Other Ambulatory Visit: Payer: Self-pay | Admitting: Cardiology

## 2020-07-13 VITALS — BP 105/78 | HR 78 | Wt 160.8 lb

## 2020-07-13 DIAGNOSIS — Z955 Presence of coronary angioplasty implant and graft: Secondary | ICD-10-CM | POA: Insufficient documentation

## 2020-07-13 DIAGNOSIS — G4733 Obstructive sleep apnea (adult) (pediatric): Secondary | ICD-10-CM | POA: Insufficient documentation

## 2020-07-13 DIAGNOSIS — Z8673 Personal history of transient ischemic attack (TIA), and cerebral infarction without residual deficits: Secondary | ICD-10-CM | POA: Diagnosis not present

## 2020-07-13 DIAGNOSIS — Z7984 Long term (current) use of oral hypoglycemic drugs: Secondary | ICD-10-CM | POA: Diagnosis not present

## 2020-07-13 DIAGNOSIS — I255 Ischemic cardiomyopathy: Secondary | ICD-10-CM | POA: Insufficient documentation

## 2020-07-13 DIAGNOSIS — E785 Hyperlipidemia, unspecified: Secondary | ICD-10-CM | POA: Insufficient documentation

## 2020-07-13 DIAGNOSIS — I2510 Atherosclerotic heart disease of native coronary artery without angina pectoris: Secondary | ICD-10-CM | POA: Insufficient documentation

## 2020-07-13 DIAGNOSIS — E119 Type 2 diabetes mellitus without complications: Secondary | ICD-10-CM | POA: Insufficient documentation

## 2020-07-13 DIAGNOSIS — Z87891 Personal history of nicotine dependence: Secondary | ICD-10-CM | POA: Diagnosis not present

## 2020-07-13 DIAGNOSIS — I110 Hypertensive heart disease with heart failure: Secondary | ICD-10-CM | POA: Diagnosis not present

## 2020-07-13 DIAGNOSIS — E7800 Pure hypercholesterolemia, unspecified: Secondary | ICD-10-CM | POA: Diagnosis not present

## 2020-07-13 DIAGNOSIS — I5042 Chronic combined systolic (congestive) and diastolic (congestive) heart failure: Secondary | ICD-10-CM

## 2020-07-13 DIAGNOSIS — I739 Peripheral vascular disease, unspecified: Secondary | ICD-10-CM

## 2020-07-13 DIAGNOSIS — Z79899 Other long term (current) drug therapy: Secondary | ICD-10-CM | POA: Diagnosis not present

## 2020-07-13 DIAGNOSIS — R079 Chest pain, unspecified: Secondary | ICD-10-CM | POA: Diagnosis not present

## 2020-07-13 DIAGNOSIS — I5022 Chronic systolic (congestive) heart failure: Secondary | ICD-10-CM | POA: Diagnosis not present

## 2020-07-13 DIAGNOSIS — I252 Old myocardial infarction: Secondary | ICD-10-CM | POA: Diagnosis not present

## 2020-07-13 DIAGNOSIS — Z7902 Long term (current) use of antithrombotics/antiplatelets: Secondary | ICD-10-CM | POA: Diagnosis not present

## 2020-07-13 DIAGNOSIS — I208 Other forms of angina pectoris: Secondary | ICD-10-CM

## 2020-07-13 DIAGNOSIS — Z7901 Long term (current) use of anticoagulants: Secondary | ICD-10-CM | POA: Insufficient documentation

## 2020-07-13 LAB — BASIC METABOLIC PANEL
Anion gap: 13 (ref 5–15)
BUN: 11 mg/dL (ref 8–23)
CO2: 27 mmol/L (ref 22–32)
Calcium: 9.9 mg/dL (ref 8.9–10.3)
Chloride: 100 mmol/L (ref 98–111)
Creatinine, Ser: 1.28 mg/dL — ABNORMAL HIGH (ref 0.61–1.24)
GFR calc Af Amer: >60 mL/min (ref >60)
GFR calc non Af Amer: 59 mL/min — ABNORMAL LOW (ref >60)
Glucose, Bld: 131 mg/dL — ABNORMAL HIGH (ref 70–99)
Potassium: 4.3 mmol/L (ref 3.5–5.1)
Sodium: 140 mmol/L (ref 135–145)

## 2020-07-13 LAB — CBC
HCT: 44 % (ref 39.0–52.0)
Hemoglobin: 14 g/dL (ref 13.0–17.0)
MCH: 25.9 pg — ABNORMAL LOW (ref 26.0–34.0)
MCHC: 31.8 g/dL (ref 30.0–36.0)
MCV: 81.5 fL (ref 80.0–100.0)
Platelets: 315 10*3/uL (ref 150–400)
RBC: 5.4 MIL/uL (ref 4.22–5.81)
RDW: 16.1 % — ABNORMAL HIGH (ref 11.5–15.5)
WBC: 5.6 10*3/uL (ref 4.0–10.5)
nRBC: 0 % (ref 0.0–0.2)

## 2020-07-13 LAB — LIPID PANEL
Cholesterol: 127 mg/dL (ref 0–200)
HDL: 51 mg/dL (ref >40)
LDL Cholesterol: 38 mg/dL (ref 0–99)
Total CHOL/HDL Ratio: 2.5 RATIO
Triglycerides: 189 mg/dL — ABNORMAL HIGH (ref <150)
VLDL: 38 mg/dL (ref 0–40)

## 2020-07-13 LAB — DIGOXIN LEVEL: Digoxin Level: 0.8 ng/mL (ref 0.8–2.0)

## 2020-07-13 NOTE — Progress Notes (Signed)
Spent about 2 hours with patient scheduling appointments and making arrangements with transportation, [place] [phone]. Appts sch for covid test, heart cath, echo, LE art dopplers, and f/u in our office. Transportation is aware of all appointments, they were all given to patient along with orders and cath instructions to return to [facility]. Message sent to Edrick Oh to arrange Lovenox bridge.

## 2020-07-13 NOTE — Progress Notes (Signed)
Advanced Heart Failure Clinic Note  PCP: [name], NP Primary Cardiologist: Dr. Harl Bowie HF Cardiology: Dr. Aundra Dubin   HPI: Daniel Li is a 64 y.o. male with long history of CAD s/p multiple ACS episodes, HTN, HLD, tobacco abuse, DM, OSA, CVA and systolic CHF.   He has a history of CAD with NSTEMI in 2007 with BMS placement to his D1 and OM2. Also with history of STEMI in May 2013 at which time he had a BMS placed to his RCA. NSTEMI in January of 2014, cath showed CTO of left circumflex, unable to intervene upon. EF 55-60% in September 2016. Anterior STEMI 4/18, DES to pLAD.   Admitted 8/9 - 06/11/17 with chest pain. Cath as below. No targets for PCI, LAD stent patent. EF remained low at 20-25% as below. Now with St Jude ICD.   Admitted 4/16 - 02/17/2018 with NSTEMI. Underwent LHC again with unchanged anatomy, no intervention. Discharged back to assisted living.   He was admitted again in 10/19 with NSTEMI.  LHC showed totally occluded PLV, 75% PDA stenosis, 80% mid LAD, occluded LCx.  He had DES to mLAD. Repeat echo showed EF 20-25%.   He returns today for followup of CHF and CAD.  I have not seen him for a couple of years.  Main complaint bringing him back in is tightness in his chest.  He notes this with a brisk walk.  It will resolve with rest.  This has gone on for a few weeks now. It is similar to prior ischemic pain.  No significant exertional dyspnea.  No lightheadedness.  No claudication.  He has had chronic left hand numbness/tingling since prior CVA.      ECG (personally reviewed): NSR, old ASMI, anterolateral TWIs  St Jude device interrogation: No VT, stable thoracic impedance.   Labs (8/18): K 3.7, creatinine 1.42 Labs (9/18): digoxin < 0.2 Labs (11/18): K 3.8, creatinine 1.03 Labs (12/18): K 4, creatinine 1.27, digoxin 0.2, hgb 14.2, LDL 51, HDL 49 Labs (03/31/2018): K 3.8 Creatinine 1.1 dig level 0.3  Labs (8/19): K 3.5, creatinine 0.98, BNP 88, digoxin 0.4 Labs  (9/19): K 3.9 Creatinine 1.34, LDL 64, TGs 151 Labs (10/19): K 4.2, creatinine 1.29, hgb 12.3 Labs (3/20): K 4.5, creatinine 1.2  Review of systems complete and found to be negative unless listed in HPI.    Past Medical History 1. OSA: Supposed to be using CPAP.  2. HTN 3. CAD: Long history.  - NSTEMI 2007 with BMS D1 and OM2. - STEMI 5/13 with BMS RCA - NSTEMI 1/14 with CTO LCx and PLV, unable to open LCx.  - STEMI 4/18 with LHC showing old TO LCx with collaterals, old TO PLV, 70% ostial D1, totally occluded proximal LAD with some collaterals => DES to LAD.  - LHC 8/18 with old TO LCx, old TO PLV, 60% mLAD, patent LAD stent.  - LHC 4/19 with totally occluded PLV, totally occluded LCx, patent LAD and D1 stents, 50% mid LAD.  - NSTEMI 10/19. LHC with totally occluded PLV, totally occluded LCx, patent proximal LAD and D1 stents, 80% mid LAD stenosis => DES to mid LAD.  4. Depression 5. Type II DM 6. Hyperlipidemia: History of rhabdomyolysis with statins, he is on Zetia.  7. Chronic systolic CHF: Ischemic cardiomyopathy.  Echo from 9/16 showed improvement in EF to 55-60%.  St Jude ICD.  - Echo (4/18) with EF 20-25%, dyskinetic apex, moderate-severe MR, severe LAE.  - Echo (8/18) with EF 20-25%, moderate  to severe MR - Echo (4/19) with EF 20-25%  - Echo (10/19) with EF 20-25%, no LV thrombus.  - RHC (10/19): mean RA 4, PA 22/7, mean PCWP 7, CI 2.3 8. PAD: - ABIs (4/18) with R ABI of 0.84 suggestive of mild disease.  L ABI of 1.27 (Normal flow at rest) - ABIs (5/19): abnormal TBI on right.  9. LV thrombus 10. H/o CVA  Current Outpatient Medications  Medication Sig Dispense Refill   acetaminophen (TYLENOL) 325 MG tablet Take 650 mg by mouth every 6 (six) hours as needed.     benzonatate (TESSALON) 100 MG capsule Take 1 capsule (100 mg total) by mouth 3 (three) times daily as needed for cough. 15 capsule 0   carvedilol (COREG) 6.25 MG tablet Take 1 tablet (6.25 mg total) by mouth 2  (two) times daily with a meal. 60 tablet 6   clopidogrel (PLAVIX) 75 MG tablet Take 1 tablet (75 mg total) by mouth daily with breakfast. 90 tablet 3   digoxin (LANOXIN) 0.125 MG tablet Take 1 tablet (0.125 mg total) by mouth daily. 30 tablet 3   empagliflozin (JARDIANCE) 10 MG TABS tablet Take 10 mg by mouth daily.     ezetimibe (ZETIA) 10 MG tablet Take 1 tablet (10 mg total) by mouth daily. 30 tablet 3   fenofibrate (TRICOR) 145 MG tablet TAKE 1 TABLET BY MOUTH DAILY. 30 tablet 11   gabapentin (NEURONTIN) 100 MG capsule Take 100 mg by mouth 2 (two) times daily.     glipiZIDE (GLUCOTROL XL) 10 MG 24 hr tablet Take 1 tablet (10 mg total) by mouth daily. 30 tablet 1   isosorbide mononitrate (IMDUR) 30 MG 24 hr tablet TAKE (1) TABLET BY MOUTH ONCE DAILY. 30 tablet 11   losartan (COZAAR) 25 MG tablet TAKE 1 TABLET BY MOUTH AT BEDTIME. 30 tablet 11   meclizine (ANTIVERT) 25 MG tablet Take 12.5 mg by mouth 3 (three) times daily as needed for dizziness.     nitroGLYCERIN (NITROSTAT) 0.4 MG SL tablet PLACE ONE (1) TABLET UNDER TONGUE EVERY 5 MINUTES UP TO (3) DOSES AS NEEDED FOR CHEST PAIN. IF NO RELIEF, CONTACT MD. 25 tablet 3   pantoprazole (PROTONIX) 40 MG tablet Take 1 tablet (40 mg total) by mouth 2 (two) times daily. 60 tablet 1   potassium chloride SA (K-DUR,KLOR-CON) 20 MEQ tablet Take 1 tablet (20 mEq total) by mouth daily. 30 tablet 3   REPATHA SURECLICK 681 MG/ML SOAJ INJECT 1 PEN INTO SKIN EVERY 14 DAYS. 2 mL 11   spironolactone (ALDACTONE) 25 MG tablet Take 1 tablet (25 mg total) by mouth at bedtime. 30 tablet 11   torsemide (DEMADEX) 20 MG tablet TAKE (1) TABLET BY MOUTH ONCE DAILY. 90 tablet 2   warfarin (COUMADIN) 5 MG tablet Take 1 tablet daily except 1 1/2 tablets on Sundays and Thursdays or as directed by Anticoagulation Clinic. 45 tablet 1   No current facility-administered medications for this encounter.    Allergies  Allergen Reactions   Pork-Derived Products  Other (See Comments)    Does not eat for religious reasons - okay with using IV heparin      Social History   Socioeconomic History   Marital status: Single    Spouse name: Not on file   Number of children: 0   Years of education: 9   Highest education level: 9th grade  Occupational History   Not on file  Tobacco Use   Smoking status: Former Smoker  Packs/day: 0.50    Years: 20.00    Pack years: 10.00    Types: Cigarettes, Cigars    Start date: 07/21/1977    Quit date: 10/15/2017    Years since quitting: 2.7   Smokeless tobacco: Never Used  Vaping Use   Vaping Use: Never used  Substance and Sexual Activity   Alcohol use: Yes    Comment: 09/17/2017 "nothing since 05/2017"   Drug use: No    Comment: previously incarcerated for drug related offense.   Sexual activity: Not Currently  Other Topics Concern   Not on file  Social History Narrative   Not on file   Social Determinants of Health   Financial Resource Strain:    Difficulty of Paying Living Expenses: Not on file  Food Insecurity:    Worried About [place] in the Last Year: Not on file   Ran Out of Food in the Last Year: Not on file  Transportation Needs:    Lack of Transportation (Medical): Not on file   Lack of Transportation (Non-Medical): Not on file  Physical Activity:    Days of Exercise per Week: Not on file   Minutes of Exercise per Session: Not on file  Stress:    Feeling of Stress : Not on file  Social Connections:    Frequency of Communication with Friends and Family: Not on file   Frequency of Social Gatherings with Friends and Family: Not on file   Attends Religious Services: Not on file   Active Member of Clubs or Organizations: Not on file   Attends Archivist Meetings: Not on file   Marital Status: Not on file  Intimate Partner Violence:    Fear of Current or Ex-Partner: Not on file   Emotionally Abused: Not on file   Physically Abused:  Not on file   Sexually Abused: Not on file      Family History  Problem Relation Age of Onset   Stroke Mother    Heart attack Mother    Heart attack Father    Stroke Sister    Heart attack Sister    Heart attack Brother    Stroke Brother    Liver disease Neg Hx    Colon cancer Neg Hx    Vitals:   07/13/20 0918  BP: 105/78  Pulse: 78  SpO2: 97%  Weight: 72.9 kg (160 lb 12.8 oz)   Wt Readings from Last 3 Encounters:  07/13/20 72.9 kg (160 lb 12.8 oz)  03/07/20 72.5 kg (159 lb 12.8 oz)  12/31/18 78 kg (172 lb)   PHYSICAL EXAM: General: NAD Neck: No JVD, no thyromegaly or thyroid nodule.  Lungs: Clear to auscultation bilaterally with normal respiratory effort. CV: Nondisplaced PMI.  Heart regular S1/S2, no S3/S4, no murmur.  No peripheral edema.  No carotid bruit.  Difficult to palpate pedal pulses.  Abdomen: Soft, nontender, no hepatosplenomegaly, no distention.  Skin: Intact without lesions or rashes.  Neurologic: Alert and oriented x 3.  Psych: Normal affect. Extremities: No clubbing or cyanosis.  HEENT: Normal.   ASSESSMENT & PLAN:  1. CAD:  Extensive prior history of CAD. He has known total occlusion LCx and PLV.  NSTEMI 10/19 with DES to mid LAD.  Over the last several weeks, he has developed typical exertional chest pain consistent with anginal decubitus.  This is concerning as he already has coronary occlusions in the LCx and RCA distributions.   - I think he is going to need  evaluation by coronary angiography.  Given CHF/cardiomyopathy, will also perform RHC.  We discussed risks/benefits of procedure and he agrees to proceed.  Will hold warfarin prior to cath with Lovenox bridge given h/o LV thrombus and CVA.  - I will continue Plavix for now.  If no obstructive disease is found, he will be able to stop Plavix given chronic warfarin use.    - No statin given history of rhabdomyolysis on statin. He is now on Repatha and Zetia. Check lipids.  2. Chronic  systolic GHW:EXHBZJIR cardiomyopathy, St Jude ICD. Echo in 10/19 with EF 20-25%.  NYHA class II symptoms, he is not volume overloaded.  - As above, will arrange for RHC with coronary angiography.  - He is due for echo to reassess LV function, will arrange.  - Continue Coreg 6.25 mg bid.  - He is not taking torsemide currently, do not think he needs it.  - He did not tolerate Entresto due to lightheadedness.  Continue losartan 25 mg daily for now.   - Continue spironolactone 25 mg daily. BMET today.  - Continue digoxin 0.125, check level today.   - Stop glipizide and start Farxiga 10 mg daily.  BMET 10 days.  3. Diabetes: Stop glipizide and start Farxiga 10 mg daily as above.  4. Smoking: He has quit smoking.  5. PAD: No claudication, foot ulcers.  - I will arrange for ABIs to assess PAD.   6. LV thrombus:  On coumadin as above. No bleeding problems.   7. 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  - On warfarin for LV thrombus. I will arrange for Lovenox bridge while off warfarin.   Followup in 3 wks.   [name], MD 07/13/20

## 2020-07-13 NOTE — H&P (View-Only) (Signed)
Advanced Heart Failure Clinic Note  PCP: [name], NP Primary Cardiologist: Dr. Harl Bowie HF Cardiology: Dr. Aundra Dubin   HPI: Daniel Li is a 64 y.o. male with long history of CAD s/p multiple ACS episodes, HTN, HLD, tobacco abuse, DM, OSA, CVA and systolic CHF.   He has a history of CAD with NSTEMI in 2007 with BMS placement to his D1 and OM2. Also with history of STEMI in May 2013 at which time he had a BMS placed to his RCA. NSTEMI in January of 2014, cath showed CTO of left circumflex, unable to intervene upon. EF 55-60% in September 2016. Anterior STEMI 4/18, DES to pLAD.   Admitted 8/9 - 06/11/17 with chest pain. Cath as below. No targets for PCI, LAD stent patent. EF remained low at 20-25% as below. Now with St Jude ICD.   Admitted 4/16 - 02/17/2018 with NSTEMI. Underwent LHC again with unchanged anatomy, no intervention. Discharged back to assisted living.   He was admitted again in 10/19 with NSTEMI.  LHC showed totally occluded PLV, 75% PDA stenosis, 80% mid LAD, occluded LCx.  He had DES to mLAD. Repeat echo showed EF 20-25%.   He returns today for followup of CHF and CAD.  I have not seen him for a couple of years.  Main complaint bringing him back in is tightness in his chest.  He notes this with a brisk walk.  It will resolve with rest.  This has gone on for a few weeks now. It is similar to prior ischemic pain.  No significant exertional dyspnea.  No lightheadedness.  No claudication.  He has had chronic left hand numbness/tingling since prior CVA.      ECG (personally reviewed): NSR, old ASMI, anterolateral TWIs  St Jude device interrogation: No VT, stable thoracic impedance.   Labs (8/18): K 3.7, creatinine 1.42 Labs (9/18): digoxin < 0.2 Labs (11/18): K 3.8, creatinine 1.03 Labs (12/18): K 4, creatinine 1.27, digoxin 0.2, hgb 14.2, LDL 51, HDL 49 Labs (03/31/2018): K 3.8 Creatinine 1.1 dig level 0.3  Labs (8/19): K 3.5, creatinine 0.98, BNP 88, digoxin 0.4 Labs  (9/19): K 3.9 Creatinine 1.34, LDL 64, TGs 151 Labs (10/19): K 4.2, creatinine 1.29, hgb 12.3 Labs (3/20): K 4.5, creatinine 1.2  Review of systems complete and found to be negative unless listed in HPI.    Past Medical History 1. OSA: Supposed to be using CPAP.  2. HTN 3. CAD: Long history.  - NSTEMI 2007 with BMS D1 and OM2. - STEMI 5/13 with BMS RCA - NSTEMI 1/14 with CTO LCx and PLV, unable to open LCx.  - STEMI 4/18 with LHC showing old TO LCx with collaterals, old TO PLV, 70% ostial D1, totally occluded proximal LAD with some collaterals => DES to LAD.  - LHC 8/18 with old TO LCx, old TO PLV, 60% mLAD, patent LAD stent.  - LHC 4/19 with totally occluded PLV, totally occluded LCx, patent LAD and D1 stents, 50% mid LAD.  - NSTEMI 10/19. LHC with totally occluded PLV, totally occluded LCx, patent proximal LAD and D1 stents, 80% mid LAD stenosis => DES to mid LAD.  4. Depression 5. Type II DM 6. Hyperlipidemia: History of rhabdomyolysis with statins, he is on Zetia.  7. Chronic systolic CHF: Ischemic cardiomyopathy.  Echo from 9/16 showed improvement in EF to 55-60%.  St Jude ICD.  - Echo (4/18) with EF 20-25%, dyskinetic apex, moderate-severe MR, severe LAE.  - Echo (8/18) with EF 20-25%, moderate  to severe MR - Echo (4/19) with EF 20-25%  - Echo (10/19) with EF 20-25%, no LV thrombus.  - RHC (10/19): mean RA 4, PA 22/7, mean PCWP 7, CI 2.3 8. PAD: - ABIs (4/18) with R ABI of 0.84 suggestive of mild disease.  L ABI of 1.27 (Normal flow at rest) - ABIs (5/19): abnormal TBI on right.  9. LV thrombus 10. H/o CVA  Current Outpatient Medications  Medication Sig Dispense Refill   acetaminophen (TYLENOL) 325 MG tablet Take 650 mg by mouth every 6 (six) hours as needed.     benzonatate (TESSALON) 100 MG capsule Take 1 capsule (100 mg total) by mouth 3 (three) times daily as needed for cough. 15 capsule 0   carvedilol (COREG) 6.25 MG tablet Take 1 tablet (6.25 mg total) by mouth 2  (two) times daily with a meal. 60 tablet 6   clopidogrel (PLAVIX) 75 MG tablet Take 1 tablet (75 mg total) by mouth daily with breakfast. 90 tablet 3   digoxin (LANOXIN) 0.125 MG tablet Take 1 tablet (0.125 mg total) by mouth daily. 30 tablet 3   empagliflozin (JARDIANCE) 10 MG TABS tablet Take 10 mg by mouth daily.     ezetimibe (ZETIA) 10 MG tablet Take 1 tablet (10 mg total) by mouth daily. 30 tablet 3   fenofibrate (TRICOR) 145 MG tablet TAKE 1 TABLET BY MOUTH DAILY. 30 tablet 11   gabapentin (NEURONTIN) 100 MG capsule Take 100 mg by mouth 2 (two) times daily.     glipiZIDE (GLUCOTROL XL) 10 MG 24 hr tablet Take 1 tablet (10 mg total) by mouth daily. 30 tablet 1   isosorbide mononitrate (IMDUR) 30 MG 24 hr tablet TAKE (1) TABLET BY MOUTH ONCE DAILY. 30 tablet 11   losartan (COZAAR) 25 MG tablet TAKE 1 TABLET BY MOUTH AT BEDTIME. 30 tablet 11   meclizine (ANTIVERT) 25 MG tablet Take 12.5 mg by mouth 3 (three) times daily as needed for dizziness.     nitroGLYCERIN (NITROSTAT) 0.4 MG SL tablet PLACE ONE (1) TABLET UNDER TONGUE EVERY 5 MINUTES UP TO (3) DOSES AS NEEDED FOR CHEST PAIN. IF NO RELIEF, CONTACT MD. 25 tablet 3   pantoprazole (PROTONIX) 40 MG tablet Take 1 tablet (40 mg total) by mouth 2 (two) times daily. 60 tablet 1   potassium chloride SA (K-DUR,KLOR-CON) 20 MEQ tablet Take 1 tablet (20 mEq total) by mouth daily. 30 tablet 3   REPATHA SURECLICK 824 MG/ML SOAJ INJECT 1 PEN INTO SKIN EVERY 14 DAYS. 2 mL 11   spironolactone (ALDACTONE) 25 MG tablet Take 1 tablet (25 mg total) by mouth at bedtime. 30 tablet 11   torsemide (DEMADEX) 20 MG tablet TAKE (1) TABLET BY MOUTH ONCE DAILY. 90 tablet 2   warfarin (COUMADIN) 5 MG tablet Take 1 tablet daily except 1 1/2 tablets on Sundays and Thursdays or as directed by Anticoagulation Clinic. 45 tablet 1   No current facility-administered medications for this encounter.    Allergies  Allergen Reactions   Pork-Derived Products  Other (See Comments)    Does not eat for religious reasons - okay with using IV heparin      Social History   Socioeconomic History   Marital status: Single    Spouse name: Not on file   Number of children: 0   Years of education: 9   Highest education level: 9th grade  Occupational History   Not on file  Tobacco Use   Smoking status: Former Smoker  Packs/day: 0.50    Years: 20.00    Pack years: 10.00    Types: Cigarettes, Cigars    Start date: 07/21/1977    Quit date: 10/15/2017    Years since quitting: 2.7   Smokeless tobacco: Never Used  Vaping Use   Vaping Use: Never used  Substance and Sexual Activity   Alcohol use: Yes    Comment: 09/17/2017 "nothing since 05/2017"   Drug use: No    Comment: previously incarcerated for drug related offense.   Sexual activity: Not Currently  Other Topics Concern   Not on file  Social History Narrative   Not on file   Social Determinants of Health   Financial Resource Strain:    Difficulty of Paying Living Expenses: Not on file  Food Insecurity:    Worried About [place] in the Last Year: Not on file   Ran Out of Food in the Last Year: Not on file  Transportation Needs:    Lack of Transportation (Medical): Not on file   Lack of Transportation (Non-Medical): Not on file  Physical Activity:    Days of Exercise per Week: Not on file   Minutes of Exercise per Session: Not on file  Stress:    Feeling of Stress : Not on file  Social Connections:    Frequency of Communication with Friends and Family: Not on file   Frequency of Social Gatherings with Friends and Family: Not on file   Attends Religious Services: Not on file   Active Member of Clubs or Organizations: Not on file   Attends Archivist Meetings: Not on file   Marital Status: Not on file  Intimate Partner Violence:    Fear of Current or Ex-Partner: Not on file   Emotionally Abused: Not on file   Physically Abused:  Not on file   Sexually Abused: Not on file      Family History  Problem Relation Age of Onset   Stroke Mother    Heart attack Mother    Heart attack Father    Stroke Sister    Heart attack Sister    Heart attack Brother    Stroke Brother    Liver disease Neg Hx    Colon cancer Neg Hx    Vitals:   07/13/20 0918  BP: 105/78  Pulse: 78  SpO2: 97%  Weight: 72.9 kg (160 lb 12.8 oz)   Wt Readings from Last 3 Encounters:  07/13/20 72.9 kg (160 lb 12.8 oz)  03/07/20 72.5 kg (159 lb 12.8 oz)  12/31/18 78 kg (172 lb)   PHYSICAL EXAM: General: NAD Neck: No JVD, no thyromegaly or thyroid nodule.  Lungs: Clear to auscultation bilaterally with normal respiratory effort. CV: Nondisplaced PMI.  Heart regular S1/S2, no S3/S4, no murmur.  No peripheral edema.  No carotid bruit.  Difficult to palpate pedal pulses.  Abdomen: Soft, nontender, no hepatosplenomegaly, no distention.  Skin: Intact without lesions or rashes.  Neurologic: Alert and oriented x 3.  Psych: Normal affect. Extremities: No clubbing or cyanosis.  HEENT: Normal.   ASSESSMENT & PLAN:  1. CAD:  Extensive prior history of CAD. He has known total occlusion LCx and PLV.  NSTEMI 10/19 with DES to mid LAD.  Over the last several weeks, he has developed typical exertional chest pain consistent with anginal decubitus.  This is concerning as he already has coronary occlusions in the LCx and RCA distributions.   - I think he is going to need  evaluation by coronary angiography.  Given CHF/cardiomyopathy, will also perform RHC.  We discussed risks/benefits of procedure and he agrees to proceed.  Will hold warfarin prior to cath with Lovenox bridge given h/o LV thrombus and CVA.  - I will continue Plavix for now.  If no obstructive disease is found, he will be able to stop Plavix given chronic warfarin use.    - No statin given history of rhabdomyolysis on statin. He is now on Repatha and Zetia. Check lipids.  2. Chronic  systolic YBW:LSLHTDSK cardiomyopathy, St Jude ICD. Echo in 10/19 with EF 20-25%.  NYHA class II symptoms, he is not volume overloaded.  - As above, will arrange for RHC with coronary angiography.  - He is due for echo to reassess LV function, will arrange.  - Continue Coreg 6.25 mg bid.  - He is not taking torsemide currently, do not think he needs it.  - He did not tolerate Entresto due to lightheadedness.  Continue losartan 25 mg daily for now.   - Continue spironolactone 25 mg daily. BMET today.  - Continue digoxin 0.125, check level today.   - Stop glipizide and start Farxiga 10 mg daily.  BMET 10 days.  3. Diabetes: Stop glipizide and start Farxiga 10 mg daily as above.  4. Smoking: He has quit smoking.  5. PAD: No claudication, foot ulcers.  - I will arrange for ABIs to assess PAD.   6. LV thrombus:  On coumadin as above. No bleeding problems.   7. 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  - On warfarin for LV thrombus. I will arrange for Lovenox bridge while off warfarin.   Followup in 3 wks.   [name], MD 07/13/20

## 2020-07-13 NOTE — Patient Instructions (Signed)
Following instructions sent back to [facility]:  Labs done today in our office  Patient needs an echocardiogram and lower extremity ultrasound, both of these can be done at [facility] in [place] Catheter[facility] on Thursday 9/23, see instructions attached  Your physician recommends that you schedule a follow-up appointment in: 4-6 weeks  If you have any questions or concerns before your next appointment please send [place] a message through [place] or call our office at [phone].    TO LEAVE A MESSAGE FOR THE NURSE SELECT OPTION 2, PLEASE LEAVE A MESSAGE INCLUDING: . YOUR NAME . DATE OF BIRTH . CALL BACK NUMBER . REASON FOR CALL**this is important as we prioritize the call backs  [place] AS LONG AS YOU CALL BEFORE 4:00 PM   HEART CATHETERIZATION INSTRUCTIONS:  You are scheduled for a Cardiac Catheterization on Thursday, September 23 with Dr. [name].  1. Please arrive at the [facility] (Main Entrance A) at [facility]: [address] [place], [place] [address] at 10:00 AM (This time is two hours before your procedure to ensure your preparation). Free valet parking service is available.   Special note: Every effort is made to have your procedure done on time. Please understand that emergencies sometimes delay scheduled procedures.  2. Diet: Do not eat solid foods after midnight.  The patient may have clear liquids until 5am upon the day of the procedure.  3. COVID TEST:  tUESDAY 07/19/20 AT 10:40 AT [facility]  4. Medication instructions in preparation for your procedure:    Thursday 9/23 AM, DO NOT TAKE:   Jardiance, Glipizide, Torsemide, or Spironolactone  On the morning of your procedure, take your Plavix/Clopidogrel and any morning medicines NOT listed above.  You may use sips of water.  5. Plan for one night stay--bring personal belongings. 6. Bring a current list of your medications and current insurance  cards. 7. You MUST have a responsible person to drive you home. 8. Someone MUST be with you the first 24 hours after you arrive home or your discharge will be delayed. 9. Please wear clothes that are easy to get on and off and wear slip-on shoes.  Thank you for allowing [place] to care for you!   -- [place] [facility]

## 2020-07-13 NOTE — Progress Notes (Signed)
Per pts pharmacy, pt received Jardiance 10 mg Daily on 9/8 per Dr Darrin Nipper, farxiga not ordered, med list updated

## 2020-07-14 ENCOUNTER — Other Ambulatory Visit: Payer: Self-pay | Admitting: *Deleted

## 2020-07-14 ENCOUNTER — Other Ambulatory Visit (HOSPITAL_COMMUNITY): Payer: Self-pay | Admitting: *Deleted

## 2020-07-14 DIAGNOSIS — I5022 Chronic systolic (congestive) heart failure: Secondary | ICD-10-CM

## 2020-07-14 MED ORDER — ENOXAPARIN SODIUM 120 MG/0.8ML ~~LOC~~ SOLN: 120.0000 mg | SUBCUTANEOUS | 0 refills | Status: DC

## 2020-07-15 ENCOUNTER — Ambulatory Visit (INDEPENDENT_AMBULATORY_CARE_PROVIDER_SITE_OTHER): Payer: Medicaid Other | Admitting: *Deleted

## 2020-07-15 DIAGNOSIS — Z5181 Encounter for therapeutic drug level monitoring: Secondary | ICD-10-CM | POA: Diagnosis not present

## 2020-07-15 DIAGNOSIS — I236 Thrombosis of atrium, auricular appendage, and ventricle as current complications following acute myocardial infarction: Secondary | ICD-10-CM | POA: Diagnosis not present

## 2020-07-15 LAB — POCT INR
INR: 2.1 (ref 2.0–3.0)
INR: 2.1 (ref 2.0–3.0)

## 2020-07-15 NOTE — Patient Instructions (Addendum)
Pending Cardiac Cath 07/21/20 Labs: 07/13/20  SCr 1.28  CrCl 60.91  Hgb 14.0  Hct 44.0  Plts 315  Wt 72.9kg  Age 63yo Lovenox 120mg  SQ daily called to RX care.  Spoke with Anderson Malta and confirmed Lovenox and warfarin doses during this period. Called and spoke with Myriam Jacobson @[facility] and went over Lovenox bridging instructions.  He verbalized understanding.  Calendar with instructions for each day provided.  9/17  Last dose of warfarin.  Take warfarin 5 mg tonight 9/18  No Lovenox or warfarin 9/19 - 9/22 Lovenox 120mg  SQ every morning at 7am 9/23  No Lovenox -------cath-------warfarin 7.5mg  pm 9/24  Lovenox 120mg  SQ am and warfarin 7.5mg  pm 9/25   Lovenox 120mg  SQ am and warfarin 5mg  pm 9/26   Lovenox 120mg  SQ am and warfarin 7.5mg  pm 9/27 - 9/28   Lovenox 120mg  SQ am and warfarin 5mg  pm 9/29   Lovenox 120mg  SQ am and INR appt at 10:15am

## 2020-07-18 ENCOUNTER — Other Ambulatory Visit (HOSPITAL_COMMUNITY): Payer: Self-pay | Admitting: Cardiology

## 2020-07-18 ENCOUNTER — Ambulatory Visit (INDEPENDENT_AMBULATORY_CARE_PROVIDER_SITE_OTHER): Payer: Medicaid Other | Admitting: *Deleted

## 2020-07-18 ENCOUNTER — Other Ambulatory Visit: Payer: Self-pay | Admitting: *Deleted

## 2020-07-18 DIAGNOSIS — I255 Ischemic cardiomyopathy: Secondary | ICD-10-CM

## 2020-07-18 MED ORDER — WARFARIN SODIUM 5 MG PO TABS: ORAL_TABLET | ORAL | 1 refills | Status: DC

## 2020-07-18 MED ORDER — WARFARIN SODIUM 5 MG PO TABS: ORAL_TABLET | ORAL | 0 refills | Status: DC

## 2020-07-19 ENCOUNTER — Other Ambulatory Visit: Payer: Self-pay

## 2020-07-19 ENCOUNTER — Other Ambulatory Visit (HOSPITAL_COMMUNITY)
Admission: RE | Admit: 2020-07-19 | Discharge: 2020-07-19 | Disposition: A | Discharge status: Home / Self Care | Payer: Medicaid Other | Source: Ambulatory Visit | Attending: Cardiology | Admitting: Cardiology

## 2020-07-19 DIAGNOSIS — Z20822 Contact with and (suspected) exposure to covid-19: Secondary | ICD-10-CM | POA: Diagnosis not present

## 2020-07-19 DIAGNOSIS — Z01812 Encounter for preprocedural laboratory examination: Secondary | ICD-10-CM | POA: Diagnosis present

## 2020-07-19 LAB — CUP PACEART REMOTE DEVICE CHECK
Battery Remaining Longevity: 78 mo
Battery Remaining Percentage: 75 %
Battery Voltage: 2.99 V
Brady Statistic RV Percent Paced: 1 %
Date Time Interrogation Session: 20210921012335
HighPow Impedance: 59 Ohm
HighPow Impedance: 59 Ohm
Implantable Lead Implant Date: 20181120
Implantable Lead Location: 753860
Implantable Pulse Generator Implant Date: 20181120
Lead Channel Impedance Value: 310 Ohm
Lead Channel Pacing Threshold Amplitude: 0.75 V
Lead Channel Pacing Threshold Pulse Width: 0.5 ms
Lead Channel Sensing Intrinsic Amplitude: 10.9 mV
Lead Channel Setting Pacing Amplitude: 2.5 V
Lead Channel Setting Pacing Pulse Width: 0.5 ms
Lead Channel Setting Sensing Sensitivity: 0.5 mV
Pulse Gen Serial Number: 9786940

## 2020-07-19 NOTE — Progress Notes (Addendum)
Called patient to let him know he missed his 10:40 appointment for his covid screening. His home line was busy. A few minutes later I called and it rang and rang, no answer.  I called what I thought was his cell phone and got the adminstrator of the facility he is at. He said he would be here at 1:30, that no one gave them a time. I called registration to make them aware. Nothing further needed.

## 2020-07-20 LAB — SARS CORONAVIRUS 2 (TAT 6-24 HRS): SARS Coronavirus 2: NEGATIVE

## 2020-07-20 NOTE — Progress Notes (Signed)
Remote ICD transmission.   

## 2020-07-21 ENCOUNTER — Encounter (HOSPITAL_COMMUNITY)
Admission: RE | Disposition: A | Discharge status: Home / Self Care | Payer: Self-pay | Source: Home / Self Care | Attending: Cardiology

## 2020-07-21 ENCOUNTER — Ambulatory Visit (HOSPITAL_COMMUNITY)
Admission: RE | Admit: 2020-07-21 | Discharge: 2020-07-21 | Disposition: A | Discharge status: Home / Self Care | Payer: Medicaid Other | Attending: Cardiology | Admitting: Cardiology

## 2020-07-21 ENCOUNTER — Other Ambulatory Visit: Payer: Self-pay

## 2020-07-21 DIAGNOSIS — Z86718 Personal history of other venous thrombosis and embolism: Secondary | ICD-10-CM | POA: Diagnosis not present

## 2020-07-21 DIAGNOSIS — G4733 Obstructive sleep apnea (adult) (pediatric): Secondary | ICD-10-CM | POA: Diagnosis not present

## 2020-07-21 DIAGNOSIS — Z8249 Family history of ischemic heart disease and other diseases of the circulatory system: Secondary | ICD-10-CM | POA: Diagnosis not present

## 2020-07-21 DIAGNOSIS — I25119 Atherosclerotic heart disease of native coronary artery with unspecified angina pectoris: Secondary | ICD-10-CM

## 2020-07-21 DIAGNOSIS — E785 Hyperlipidemia, unspecified: Secondary | ICD-10-CM | POA: Diagnosis not present

## 2020-07-21 DIAGNOSIS — E119 Type 2 diabetes mellitus without complications: Secondary | ICD-10-CM | POA: Insufficient documentation

## 2020-07-21 DIAGNOSIS — Z955 Presence of coronary angioplasty implant and graft: Secondary | ICD-10-CM | POA: Insufficient documentation

## 2020-07-21 DIAGNOSIS — Z79899 Other long term (current) drug therapy: Secondary | ICD-10-CM | POA: Insufficient documentation

## 2020-07-21 DIAGNOSIS — Z87891 Personal history of nicotine dependence: Secondary | ICD-10-CM | POA: Insufficient documentation

## 2020-07-21 DIAGNOSIS — I252 Old myocardial infarction: Secondary | ICD-10-CM | POA: Insufficient documentation

## 2020-07-21 DIAGNOSIS — I110 Hypertensive heart disease with heart failure: Secondary | ICD-10-CM | POA: Diagnosis not present

## 2020-07-21 DIAGNOSIS — Z8673 Personal history of transient ischemic attack (TIA), and cerebral infarction without residual deficits: Secondary | ICD-10-CM | POA: Insufficient documentation

## 2020-07-21 DIAGNOSIS — Z7984 Long term (current) use of oral hypoglycemic drugs: Secondary | ICD-10-CM | POA: Insufficient documentation

## 2020-07-21 DIAGNOSIS — I255 Ischemic cardiomyopathy: Secondary | ICD-10-CM | POA: Insufficient documentation

## 2020-07-21 DIAGNOSIS — I200 Unstable angina: Secondary | ICD-10-CM | POA: Diagnosis present

## 2020-07-21 DIAGNOSIS — I739 Peripheral vascular disease, unspecified: Secondary | ICD-10-CM | POA: Diagnosis not present

## 2020-07-21 DIAGNOSIS — I509 Heart failure, unspecified: Secondary | ICD-10-CM | POA: Diagnosis not present

## 2020-07-21 DIAGNOSIS — I5022 Chronic systolic (congestive) heart failure: Secondary | ICD-10-CM | POA: Insufficient documentation

## 2020-07-21 DIAGNOSIS — Z7901 Long term (current) use of anticoagulants: Secondary | ICD-10-CM | POA: Diagnosis not present

## 2020-07-21 DIAGNOSIS — Z823 Family history of stroke: Secondary | ICD-10-CM | POA: Insufficient documentation

## 2020-07-21 DIAGNOSIS — Z7902 Long term (current) use of antithrombotics/antiplatelets: Secondary | ICD-10-CM | POA: Insufficient documentation

## 2020-07-21 DIAGNOSIS — I25110 Atherosclerotic heart disease of native coronary artery with unstable angina pectoris: Secondary | ICD-10-CM | POA: Insufficient documentation

## 2020-07-21 HISTORY — PX: RIGHT/LEFT HEART CATH AND CORONARY ANGIOGRAPHY: CATH118266

## 2020-07-21 LAB — POCT I-STAT EG7
Acid-Base Excess: 1 mmol/L (ref 0.0–2.0)
Acid-Base Excess: 2 mmol/L (ref 0.0–2.0)
Bicarbonate: 27.4 mmol/L (ref 20.0–28.0)
Bicarbonate: 28.4 mmol/L — ABNORMAL HIGH (ref 20.0–28.0)
Calcium, Ion: 1.26 mmol/L (ref 1.15–1.40)
Calcium, Ion: 1.29 mmol/L (ref 1.15–1.40)
HCT: 39 % (ref 39.0–52.0)
HCT: 39 % (ref 39.0–52.0)
Hemoglobin: 13.3 g/dL (ref 13.0–17.0)
Hemoglobin: 13.3 g/dL (ref 13.0–17.0)
O2 Saturation: 67 %
O2 Saturation: 67 %
Potassium: 3.9 mmol/L (ref 3.5–5.1)
Potassium: 4 mmol/L (ref 3.5–5.1)
Sodium: 141 mmol/L (ref 135–145)
Sodium: 142 mmol/L (ref 135–145)
TCO2: 29 mmol/L (ref 22–32)
TCO2: 30 mmol/L (ref 22–32)
pCO2, Ven: 48.4 mmHg (ref 44.0–60.0)
pCO2, Ven: 49.2 mmHg (ref 44.0–60.0)
pH, Ven: 7.362 (ref 7.250–7.430)
pH, Ven: 7.37 (ref 7.250–7.430)
pO2, Ven: 36 mmHg (ref 32.0–45.0)
pO2, Ven: 36 mmHg (ref 32.0–45.0)

## 2020-07-21 LAB — GLUCOSE, CAPILLARY
Glucose-Capillary: 78 mg/dL (ref 70–99)
Glucose-Capillary: 52 mg/dL — ABNORMAL LOW (ref 70–99)
Glucose-Capillary: 56 mg/dL — ABNORMAL LOW (ref 70–99)
Glucose-Capillary: 72 mg/dL (ref 70–99)

## 2020-07-21 LAB — PROTIME-INR
INR: 1.4 — ABNORMAL HIGH (ref 0.8–1.2)
Prothrombin Time: 16.3 seconds — ABNORMAL HIGH (ref 11.4–15.2)

## 2020-07-21 SURGERY — RIGHT/LEFT HEART CATH AND CORONARY ANGIOGRAPHY
Anesthesia: LOCAL

## 2020-07-21 MED ORDER — FENTANYL CITRATE (PF) 100 MCG/2ML IJ SOLN
INTRAMUSCULAR | Status: AC
  Filled 2020-07-21: qty 2

## 2020-07-21 MED ORDER — SODIUM CHLORIDE 0.9 % IV SOLN: 250.0000 mL | INTRAVENOUS | Status: DC | PRN

## 2020-07-21 MED ORDER — MIDAZOLAM HCL 2 MG/2ML IJ SOLN
INTRAMUSCULAR | Status: AC
  Filled 2020-07-21: qty 2

## 2020-07-21 MED ORDER — ASPIRIN 81 MG PO CHEW
81.0000 mg | CHEWABLE_TABLET | ORAL | Status: AC
  Administered 2020-07-21: 81 mg via ORAL
  Filled 2020-07-21: qty 1

## 2020-07-21 MED ORDER — FENTANYL CITRATE (PF) 100 MCG/2ML IJ SOLN
INTRAMUSCULAR | Status: DC | PRN
  Administered 2020-07-21 (×2): 12.5 ug via INTRAVENOUS

## 2020-07-21 MED ORDER — HEPARIN (PORCINE) IN NACL 1000-0.9 UT/500ML-% IV SOLN
INTRAVENOUS | Status: AC
  Filled 2020-07-21: qty 1000

## 2020-07-21 MED ORDER — SODIUM CHLORIDE 0.9% FLUSH: 3.0000 mL | INTRAVENOUS | Status: DC | PRN

## 2020-07-21 MED ORDER — SODIUM CHLORIDE 0.9 % IV SOLN: INTRAVENOUS | Status: DC

## 2020-07-21 MED ORDER — HEPARIN (PORCINE) IN NACL 1000-0.9 UT/500ML-% IV SOLN
INTRAVENOUS | Status: DC | PRN
  Administered 2020-07-21 (×2): 500 mL

## 2020-07-21 MED ORDER — HEPARIN SODIUM (PORCINE) 1000 UNIT/ML IJ SOLN
INTRAMUSCULAR | Status: DC | PRN
  Administered 2020-07-21: 3500 [IU] via INTRAVENOUS

## 2020-07-21 MED ORDER — LIDOCAINE HCL (PF) 1 % IJ SOLN
INTRAMUSCULAR | Status: AC
  Filled 2020-07-21: qty 30

## 2020-07-21 MED ORDER — HEPARIN SODIUM (PORCINE) 1000 UNIT/ML IJ SOLN
INTRAMUSCULAR | Status: AC
  Filled 2020-07-21: qty 1

## 2020-07-21 MED ORDER — CLOPIDOGREL BISULFATE 75 MG PO TABS: 75.0000 mg | ORAL_TABLET | Freq: Once | ORAL | Status: AC

## 2020-07-21 MED ORDER — MIDAZOLAM HCL 2 MG/2ML IJ SOLN
INTRAMUSCULAR | Status: DC | PRN
  Administered 2020-07-21 (×2): 0.5 mg via INTRAVENOUS

## 2020-07-21 MED ORDER — LIDOCAINE HCL (PF) 1 % IJ SOLN
INTRAMUSCULAR | Status: DC | PRN
  Administered 2020-07-21 (×2): 2 mL

## 2020-07-21 MED ORDER — SODIUM CHLORIDE 0.9% FLUSH: 3.0000 mL | Freq: Two times a day (BID) | INTRAVENOUS | Status: DC

## 2020-07-21 MED ORDER — VERAPAMIL HCL 2.5 MG/ML IV SOLN
INTRAVENOUS | Status: AC
  Filled 2020-07-21: qty 2

## 2020-07-21 MED ORDER — HYDRALAZINE HCL 20 MG/ML IJ SOLN: 10.0000 mg | INTRAMUSCULAR | Status: DC | PRN

## 2020-07-21 MED ORDER — SODIUM CHLORIDE 0.9 % IV SOLN
INTRAVENOUS | Status: AC | PRN
  Administered 2020-07-21: 250 mL via INTRAVENOUS

## 2020-07-21 MED ORDER — VERAPAMIL HCL 2.5 MG/ML IV SOLN
INTRAVENOUS | Status: DC | PRN
  Administered 2020-07-21: 10 mL via INTRA_ARTERIAL

## 2020-07-21 MED ORDER — CLOPIDOGREL BISULFATE 75 MG PO TABS
ORAL_TABLET | ORAL | Status: AC
  Administered 2020-07-21: 75 mg via ORAL
  Filled 2020-07-21: qty 1

## 2020-07-21 MED ORDER — LABETALOL HCL 5 MG/ML IV SOLN: 10.0000 mg | INTRAVENOUS | Status: DC | PRN

## 2020-07-21 MED ORDER — IOHEXOL 350 MG/ML SOLN
INTRAVENOUS | Status: DC | PRN
  Administered 2020-07-21: 50 mL

## 2020-07-21 MED ORDER — ACETAMINOPHEN 325 MG PO TABS: 650.0000 mg | ORAL_TABLET | ORAL | Status: DC | PRN

## 2020-07-21 MED ORDER — ONDANSETRON HCL 4 MG/2ML IJ SOLN: 4.0000 mg | Freq: Four times a day (QID) | INTRAMUSCULAR | Status: DC | PRN

## 2020-07-21 SURGICAL SUPPLY — 10 items
CATH 5FR JL3.5 JR4 ANG PIG MP (CATHETERS) ×2 IMPLANT
CATH SWAN GANZ 7F STRAIGHT (CATHETERS) ×2 IMPLANT
GLIDESHEATH SLEND SS 6F .021 (SHEATH) ×2 IMPLANT
GLIDESHEATH SLENDER 7FR .021G (SHEATH) ×2 IMPLANT
GUIDEWIRE .025 260CM (WIRE) ×2 IMPLANT
GUIDEWIRE INQWIRE 1.5J.035X260 (WIRE) ×1 IMPLANT
INQWIRE 1.5J .035X260CM (WIRE) ×2
KIT HEART LEFT (KITS) ×2 IMPLANT
PACK CARDIAC CATHETERIZATION (CUSTOM PROCEDURE TRAY) ×2 IMPLANT
TRANSDUCER W/STOPCOCK (MISCELLANEOUS) ×2 IMPLANT

## 2020-07-21 NOTE — Discharge Instructions (Signed)
Coumadin and Lovenox Dosing Instructions  9/23 Take warfarin 10 mg tonight 9/24  Lovenox 120mg  SQ am and warfarin 7.5mg  pm 9/25   Lovenox 120mg  SQ am and warfarin 5mg  pm 9/26   Lovenox 120mg  SQ am and warfarin 7.5mg  pm 9/27 - 9/28   Lovenox 120mg  SQ am and warfarin 5mg  pm 9/29   Lovenox 120mg  SQ am and INR appt at 10:15am  Coumadin Clinic Follow-up Appointment in [facility] 9/29 at 10:15 AM    NO METFORMIN/ GLUCOPHAGE FOR 2 DAYS   Radial Site Care  This sheet gives you information about how to care for yourself after your procedure. Your health care provider may also give you more specific instructions. If you have problems or questions, contact your health care provider. What can I expect after the procedure? After the procedure, it is common to have:  Bruising and tenderness at the catheter insertion area. Follow these instructions at home: Medicines  Take over-the-counter and prescription medicines only as told by your health care provider. Insertion site care  Follow instructions from your health care provider about how to take care of your insertion site. Make sure you: ? Wash your hands with soap and water before you change your bandage (dressing). If soap and water are not available, use hand sanitizer. ? Change your dressing as told by your health care provider. ? Leave stitches (sutures), skin glue, or adhesive strips in place. These skin closures may need to stay in place for 2 weeks or longer. If adhesive strip edges start to loosen and curl up, you may trim the loose edges. Do not remove adhesive strips completely unless your health care provider tells you to do that.  Check your insertion site every day for signs of infection. Check for: ? Redness, swelling, or pain. ? Fluid or blood. ? Pus or a bad smell. ? Warmth.  Do not take baths, swim, or use a hot tub until your health care provider approves.  You may shower 24-48 hours after the procedure, or as directed by  your health care provider. ? Remove the dressing and gently wash the site with plain soap and water. ? Pat the area dry with a clean towel. ? Do not rub the site. That could cause bleeding.  Do not apply powder or lotion to the site. Activity   For 24 hours after the procedure, or as directed by your health care provider: ? Do not flex or bend the affected arm. ? Do not push or pull heavy objects with the affected arm. ? Do not drive yourself home from the hospital or clinic. You may drive 24 hours after the procedure unless your health care provider tells you not to. ? Do not operate machinery or power tools.  Do not lift anything that is heavier than 10 lb (4.5 kg), or the limit that you are told, until your health care provider says that it is safe.  Ask your health care provider when it is okay to: ? Return to work or school. ? Resume usual physical activities or sports. ? Resume sexual activity. General instructions  If the catheter site starts to bleed, raise your arm and put firm pressure on the site. If the bleeding does not stop, get help right away. This is a medical emergency.  If you went home on the same day as your procedure, a responsible adult should be with you for the first 24 hours after you arrive home.  Keep all follow-up visits as told  by your health care provider. This is important. Contact a health care provider if:  You have a fever.  You have redness, swelling, or yellow drainage around your insertion site. Get help right away if:  You have unusual pain at the radial site.  The catheter insertion area swells very fast.  The insertion area is bleeding, and the bleeding does not stop when you hold steady pressure on the area.  Your arm or hand becomes pale, cool, tingly, or numb. These symptoms may represent a serious problem that is an emergency. Do not wait to see if the symptoms will go away. Get medical help right away. Call your local emergency  services (911 in the [place] Do not drive yourself to the hospital. Summary  After the procedure, it is common to have bruising and tenderness at the site.  Follow instructions from your health care provider about how to take care of your radial site wound. Check the wound every day for signs of infection.  Do not lift anything that is heavier than 10 lb (4.5 kg), or the limit that you are told, until your health care provider says that it is safe. This information is not intended to replace advice given to you by your health care provider. Make sure you discuss any questions you have with your health care provider. Document Revised: 11/20/2017 Document Reviewed: 11/20/2017 Elsevier Patient Education  [place].    1. Start warfarin tonight.  2. Start back on Lovenox bridge tomorrow morning.

## 2020-07-21 NOTE — Interval H&P Note (Signed)
History and Physical Interval Note:  07/21/2020 12:24 PM  Daniel Li  has presented today for surgery, with the diagnosis of chest pain.  The various methods of treatment have been discussed with the patient and family. After consideration of risks, benefits and other options for treatment, the patient has consented to  Procedure(s): RIGHT/LEFT HEART CATH AND CORONARY ANGIOGRAPHY (N/A) as a surgical intervention.  The patient's history has been reviewed, patient examined, no change in status, stable for surgery.  I have reviewed the patient's chart and labs.  Questions were answered to the patient's satisfaction.     Daniel Li [employer]

## 2020-07-22 ENCOUNTER — Encounter (HOSPITAL_COMMUNITY): Payer: Self-pay | Admitting: Cardiology

## 2020-07-28 ENCOUNTER — Ambulatory Visit (INDEPENDENT_AMBULATORY_CARE_PROVIDER_SITE_OTHER): Payer: Medicaid Other | Admitting: *Deleted

## 2020-07-28 DIAGNOSIS — Z5181 Encounter for therapeutic drug level monitoring: Secondary | ICD-10-CM | POA: Diagnosis not present

## 2020-07-28 DIAGNOSIS — I236 Thrombosis of atrium, auricular appendage, and ventricle as current complications following acute myocardial infarction: Secondary | ICD-10-CM

## 2020-07-28 LAB — POCT INR: INR: 1.7 — AB (ref 2.0–3.0)

## 2020-07-28 MED ORDER — WARFARIN SODIUM 5 MG PO TABS: ORAL_TABLET | ORAL | 1 refills | Status: DC

## 2020-07-28 NOTE — Patient Instructions (Signed)
Take warfarin 2 1/2 tablets tonight, take 1 1/2 tablets tomorrow night then resume 1 tablet daily except 1 1/2 tablets on Sundays and Thursdays. Took last dose of Lovenox yesterday Recheck in 2 wks   *No charge remote INRs, charge [phone] if in person d/t traditional Medicaid* Orders sent to [facility]

## 2020-08-01 ENCOUNTER — Ambulatory Visit (INDEPENDENT_AMBULATORY_CARE_PROVIDER_SITE_OTHER): Payer: Medicaid Other

## 2020-08-01 DIAGNOSIS — I5022 Chronic systolic (congestive) heart failure: Secondary | ICD-10-CM

## 2020-08-01 DIAGNOSIS — Z95810 Presence of automatic (implantable) cardiac defibrillator: Secondary | ICD-10-CM

## 2020-08-04 ENCOUNTER — Ambulatory Visit (INDEPENDENT_AMBULATORY_CARE_PROVIDER_SITE_OTHER): Payer: Medicaid Other

## 2020-08-04 DIAGNOSIS — I5042 Chronic combined systolic (congestive) and diastolic (congestive) heart failure: Secondary | ICD-10-CM | POA: Diagnosis not present

## 2020-08-04 DIAGNOSIS — I739 Peripheral vascular disease, unspecified: Secondary | ICD-10-CM

## 2020-08-04 LAB — ECHOCARDIOGRAM COMPLETE
Area-P 1/2: 5.31 cm2
Calc EF: 36.6 %
MV M vel: 4.2 m/s
MV Peak grad: 70.6 mmHg
S' Lateral: 4.91 cm
Single Plane A2C EF: 39.4 %
Single Plane A4C EF: 35.7 %

## 2020-08-04 NOTE — Progress Notes (Signed)
EPIC Encounter for ICM Monitoring  Patient Name: Daniel Li is a 64 y.o. male Date: 08/04/2020 Primary Care Physican: [name], NP Primary Cardiologist:Branch/ [facility] Electrophysiologist:Allred 07/13/2020 Office Weight:160lbs  Transmission reviewed.   CorVue thoracic impedance suggesting normal fluid levels.  Prescribed dosage:  Torsemide20 mgTake 20 mg by mouth daily as needed (weight gain > 3 lbs overnight or >5 lbs.).   Potassium 20 mEq take 1 tablet daily.  Labs: 07/13/2020 Creatinine 1.28, BUN 11, Potassium 4.3, Sodium 140, GFR 59->60 *A complete set of results can be found in Results Review.  Recommendations: No changes  Follow-up plan: ICM clinic phone appointment on11/05/2020. 91 day device clinic remote transmission 10/19/2020.    EP/Cardiology Office Visits: 08/18/2020 with [facility] clinic.  Copy of ICM check sent to Dr.Allred  3 month ICM trend: 08/02/2020    1 Year ICM trend:       Rosalene Billings, RN 08/04/2020 11:14 AM

## 2020-08-05 ENCOUNTER — Telehealth (HOSPITAL_COMMUNITY): Payer: Self-pay

## 2020-08-05 NOTE — Telephone Encounter (Signed)
-----   Message from Larey Dresser, MD sent at 08/04/2020  6:23 PM EDT ----- EF remains low, 25%.

## 2020-08-05 NOTE — Telephone Encounter (Signed)
-----   Message from Larey Dresser, MD sent at 08/04/2020  6:24 PM EDT ----- No evidence for significant PAD

## 2020-08-05 NOTE — Telephone Encounter (Signed)
Malena Edman, RN  08/05/2020 11:00 AM EDT Back to Top    Patient's caregiver advised and verbalized understanding

## 2020-08-17 NOTE — Progress Notes (Signed)
Advanced Heart Failure Clinic Note  PCP: [name], NP Primary Cardiologist: Dr. Harl Bowie HF Cardiology: Dr. Aundra Dubin   HPI: Daniel Li is a 64 y.o. male with long history of CAD s/p multiple ACS episodes, HTN, HLD, tobacco abuse, DM, OSA, CVA and systolic CHF.   He has a history of CAD with NSTEMI in 2007 with BMS placement to his D1 and OM2. Also with history of STEMI in May 2013 at which time he had a BMS placed to his RCA. NSTEMI in January of 2014, cath showed CTO of left circumflex, unable to intervene upon. EF 55-60% in September 2016. Anterior STEMI 4/18, DES to pLAD.   Admitted 8/9 - 06/11/17 with chest pain. Cath as below. No targets for PCI, LAD stent patent. EF remained low at 20-25% as below. Now with St Jude ICD.   Admitted 4/16 - 02/17/2018 with NSTEMI. Underwent LHC again with unchanged anatomy, no intervention. Discharged back to assisted living.   He was admitted again in 10/19 with NSTEMI.  LHC showed totally occluded PLV, 75% PDA stenosis, 80% mid LAD, occluded LCx.  He had DES to mLAD. Repeat echo showed EF 20-25%.   Had RHC/LHC 06/2020 with coronaries unchanged from prior.  Occluded PLV and proximal LCx with collaterals.  Moderate stenoses in D2 and PDA appear unchanged.  No good interventional target, medical management.   Today he returns for HF follow up. Overall feeling fine. Denies SOB/PND/Orthopnea. No chest pain. Appetite ok. No fever or chills. Weight at home has been stable. Taking all medications.  Lives at [facility] in [place].      St Jude device interrogation: Impedance down a little. Activity ~ 4-6 hours.  Labs (8/18): K 3.7, creatinine 1.42 Labs (9/18): digoxin < 0.2 Labs (11/18): K 3.8, creatinine 1.03 Labs (12/18): K 4, creatinine 1.27, digoxin 0.2, hgb 14.2, LDL 51, HDL 49 Labs (03/31/2018): K 3.8 Creatinine 1.1 dig level 0.3  Labs (8/19): K 3.5, creatinine 0.98, BNP 88, digoxin 0.4 Labs (9/19): K 3.9 Creatinine 1.34, LDL 64, TGs 151 Labs  (10/19): K 4.2, creatinine 1.29, hgb 12.3 Labs (3/20): K 4.5, creatinine 1.2  Review of systems complete and found to be negative unless listed in HPI.    Past Medical History 1. OSA: Supposed to be using CPAP.  2. HTN 3. CAD: Long history.  - NSTEMI 2007 with BMS D1 and OM2. - STEMI 5/13 with BMS RCA - NSTEMI 1/14 with CTO LCx and PLV, unable to open LCx.  - STEMI 4/18 with LHC showing old TO LCx with collaterals, old TO PLV, 70% ostial D1, totally occluded proximal LAD with some collaterals => DES to LAD.  - LHC 8/18 with old TO LCx, old TO PLV, 60% mLAD, patent LAD stent.  - LHC 4/19 with totally occluded PLV, totally occluded LCx, patent LAD and D1 stents, 50% mid LAD.  - NSTEMI 10/19. LHC with totally occluded PLV, totally occluded LCx, patent proximal LAD and D1 stents, 80% mid LAD stenosis => DES to mid LAD.  -LHC 07/21/20 1. Low filling pressures. Low but not markedly low cardiac output.  3. Coronaries unchanged from prior.  Occluded PLV and proximal LCx with collaterals.  Moderate stenoses in D2 and PDA appear unchanged.  No good interventional target, medical management.  4. Depression 5. Type II DM 6. Hyperlipidemia: History of rhabdomyolysis with statins, he is on Zetia.  7. Chronic systolic CHF: Ischemic cardiomyopathy.  Echo from 9/16 showed improvement in EF to 55-60%.  St  Jude ICD.  - Echo (4/18) with EF 20-25%, dyskinetic apex, moderate-severe MR, severe LAE.  - Echo (8/18) with EF 20-25%, moderate to severe MR - Echo (4/19) with EF 20-25%  - Echo (10/19) with EF 20-25%, no LV thrombus.  - Echo 08/04/20 EF 20-25%  - RHC (10/19): mean RA 4, PA 22/7, mean PCWP 7, CI 2.3 8. PAD: - ABIs (4/18) with R ABI of 0.84 suggestive of mild disease.  L ABI of 1.27 (Normal flow at rest) - ABIs (5/19): abnormal TBI on right.  9. LV thrombus 10. H/o CVA  Current Outpatient Medications  Medication Sig Dispense Refill  . acetaminophen (TYLENOL) 325 MG tablet Take 650 mg by mouth  every 6 (six) hours as needed for mild pain, fever or headache.     [name] Kitchen aspirin EC 81 MG tablet Take 81 mg by mouth once. Swallow whole. Pre Cath    . carvedilol (COREG) 3.125 MG tablet Take 6.25 mg by mouth 2 (two) times daily with a meal.    . clopidogrel (PLAVIX) 75 MG tablet Take 1 tablet (75 mg total) by mouth daily with breakfast. 90 tablet 3  . digoxin (LANOXIN) 0.125 MG tablet Take 1 tablet (0.125 mg total) by mouth daily. 30 tablet 3  . empagliflozin (JARDIANCE) 10 MG TABS tablet Take 10 mg by mouth daily.    . Evolocumab (REPATHA SURECLICK) 161 MG/ML SOAJ Inject 140 mg into the skin every 14 (fourteen) days.    [name] Kitchen ezetimibe (ZETIA) 10 MG tablet Take 1 tablet (10 mg total) by mouth daily. 30 tablet 3  . fenofibrate (TRICOR) 145 MG tablet TAKE 1 TABLET BY MOUTH DAILY. 30 tablet 11  . gabapentin (NEURONTIN) 100 MG capsule Take 100 mg by mouth 2 (two) times daily.    [name] Kitchen glipiZIDE (GLUCOTROL) 10 MG tablet Take 10 mg by mouth 2 (two) times daily before a meal.    . isosorbide mononitrate (IMDUR) 30 MG 24 hr tablet Take 30 mg by mouth daily.    [name] Kitchen losartan (COZAAR) 25 MG tablet Take 25 mg by mouth at bedtime.    . meclizine (ANTIVERT) 25 MG tablet Take 12.5 mg by mouth 3 (three) times daily as needed for dizziness.    . metFORMIN (GLUCOPHAGE) 500 MG tablet Take 500 mg by mouth daily with breakfast.    . nitroGLYCERIN (NITROSTAT) 0.4 MG SL tablet PLACE ONE (1) TABLET UNDER TONGUE EVERY 5 MINUTES UP TO (3) DOSES AS NEEDED FOR CHEST PAIN. IF NO RELIEF, CONTACT MD. 25 tablet 3  . pantoprazole (PROTONIX) 40 MG tablet Take 40 mg by mouth daily.    . potassium chloride SA (K-DUR,KLOR-CON) 20 MEQ tablet Take 1 tablet (20 mEq total) by mouth daily. 30 tablet 3  . spironolactone (ALDACTONE) 25 MG tablet Take 1 tablet (25 mg total) by mouth at bedtime. 30 tablet 11  . torsemide (DEMADEX) 20 MG tablet Take 20 mg by mouth daily as needed (weight gain > 3 lbs overnight or >5 lbs.).     [name] Kitchen warfarin (COUMADIN) 5 MG  tablet Take 2 1/2 tablets tonight, 1 1/2 tablets tomorrow night then resume 1 tablet daily except 1 1/2 tablets on Sundays and Thursdays or as directed. 30 tablet 1   No current facility-administered medications for this encounter.    Allergies  Allergen Reactions  . Pork-Derived Products Other (See Comments)    Does not eat for religious reasons - okay with using IV heparin      Social History  Socioeconomic History  . Marital status: Single    Spouse name: Not on file  . Number of children: 0  . Years of education: 9  . Highest education level: 9th grade  Occupational History  . Not on file  Tobacco Use  . Smoking status: Former Smoker    Packs/day: 0.50    Years: 20.00    Pack years: 10.00    Types: Cigarettes, Cigars    Start date: 07/21/1977    Quit date: 10/15/2017    Years since quitting: 2.8  . Smokeless tobacco: Never Used  Vaping Use  . Vaping Use: Never used  Substance and Sexual Activity  . Alcohol use: Yes    Comment: 09/17/2017 "nothing since 05/2017"  . Drug use: No    Comment: previously incarcerated for drug related offense.  [name] Kitchen Sexual activity: Not Currently  Other Topics Concern  . Not on file  Social History Narrative  . Not on file   Social Determinants of Health   Financial Resource Strain:   . Difficulty of Paying Living Expenses: Not on file  Food Insecurity:   . Worried About Charity fundraiser in the Last Year: Not on file  . Ran Out of Food in the Last Year: Not on file  Transportation Needs:   . Lack of Transportation (Medical): Not on file  . Lack of Transportation (Non-Medical): Not on file  Physical Activity:   . Days of Exercise per Week: Not on file  . Minutes of Exercise per Session: Not on file  Stress:   . Feeling of Stress : Not on file  Social Connections:   . Frequency of Communication with Friends and Family: Not on file  . Frequency of Social Gatherings with Friends and Family: Not on file  . Attends Religious  Services: Not on file  . Active Member of Clubs or Organizations: Not on file  . Attends Archivist Meetings: Not on file  . Marital Status: Not on file  Intimate Partner Violence:   . Fear of Current or Ex-Partner: Not on file  . Emotionally Abused: Not on file  . Physically Abused: Not on file  . Sexually Abused: Not on file      Family History  Problem Relation Age of Onset  . Stroke Mother   . Heart attack Mother   . Heart attack Father   . Stroke Sister   . Heart attack Sister   . Heart attack Brother   . Stroke Brother   . Liver disease Neg Hx   . Colon cancer Neg Hx    Vitals:   08/18/20 1010  BP: 108/72  Pulse: 87  SpO2: 99%  Weight: 73.6 kg (162 lb 3.2 oz)   Wt Readings from Last 3 Encounters:  08/18/20 73.6 kg (162 lb 3.2 oz)  07/21/20 72.6 kg (160 lb)  07/13/20 72.9 kg (160 lb 12.8 oz)   PHYSICAL EXAM: General:  Well appearing. No resp difficulty HEENT: normal Neck: supple. no JVD. Carotids 2+ bilat; no bruits. No lymphadenopathy or thryomegaly appreciated. Cor: PMI nondisplaced. Regular rate & rhythm. No rubs, gallops or murmurs. Lungs: clear Abdomen: soft, nontender, nondistended. No hepatosplenomegaly. No bruits or masses. Good bowel sounds. Extremities: no cyanosis, clubbing, rash, edema Neuro: alert & orientedx3, cranial nerves grossly intact. moves all 4 extremities w/o difficulty. Affect pleasant  ASSESSMENT & PLAN:  1. CAD:  Extensive prior history of CAD. He has known total occlusion LCx and PLV.  NSTEMI 10/19 with DES  to mid LAD.  Over the last several weeks, he has developed typical exertional chest pain consistent with anginal decubitus.  This is concerning as he already has coronary occlusions in the LCx and RCA distributions.   - Repeat LHC - no change in coronary anatomy. Continue medical therapy. -  If no obstructive disease is found, he will be able to stop Plavix given chronic warfarin use.    - No chest pain  - No statin  given history of rhabdomyolysis on statin. He is now on Repatha and Zetia.  2. Chronic systolic VFM:[id number] cardiomyopathy, St Jude ICD. Echo in 10/19 with EF 20-25%. - NYHA II. Volume status stable. Does not need diuretics.  - Continue Coreg 6.25 mg bid.  - He did not tolerate Entresto due to lightheadedness.  Continue losartan 25 mg daily for now.   - Continue spironolactone 25 mg daily. BMET today.  - Continue digoxin 0.125 mg, dig leven 0.8    - Continue Farxiga 10 mg daily. 3. Diabetes: Continue Farxiga 10 mg daily  4. Smoking: He has quit smoking.  5. PAD: No claudication, foot ulcers.  -ABIs to assess PAD.   6. LV thrombus:  On coumadin as above. No bleeding problems.   7. 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  - On warfarin for LV thrombus.   Follow up in 3 months.   Darrick Grinder, NP 08/18/20

## 2020-08-18 ENCOUNTER — Ambulatory Visit (HOSPITAL_COMMUNITY)
Admission: RE | Admit: 2020-08-18 | Discharge: 2020-08-18 | Disposition: A | Discharge status: Home / Self Care | Payer: Medicaid Other | Source: Ambulatory Visit | Attending: Adult Health | Admitting: Adult Health

## 2020-08-18 ENCOUNTER — Other Ambulatory Visit: Payer: Self-pay

## 2020-08-18 ENCOUNTER — Encounter (HOSPITAL_COMMUNITY): Payer: Self-pay

## 2020-08-18 VITALS — BP 108/72 | HR 87 | Wt 162.2 lb

## 2020-08-18 DIAGNOSIS — G4733 Obstructive sleep apnea (adult) (pediatric): Secondary | ICD-10-CM | POA: Diagnosis not present

## 2020-08-18 DIAGNOSIS — I252 Old myocardial infarction: Secondary | ICD-10-CM | POA: Insufficient documentation

## 2020-08-18 DIAGNOSIS — Z955 Presence of coronary angioplasty implant and graft: Secondary | ICD-10-CM | POA: Diagnosis not present

## 2020-08-18 DIAGNOSIS — E785 Hyperlipidemia, unspecified: Secondary | ICD-10-CM | POA: Insufficient documentation

## 2020-08-18 DIAGNOSIS — Z79899 Other long term (current) drug therapy: Secondary | ICD-10-CM | POA: Diagnosis not present

## 2020-08-18 DIAGNOSIS — I255 Ischemic cardiomyopathy: Secondary | ICD-10-CM | POA: Diagnosis not present

## 2020-08-18 DIAGNOSIS — I5022 Chronic systolic (congestive) heart failure: Secondary | ICD-10-CM

## 2020-08-18 DIAGNOSIS — Z87891 Personal history of nicotine dependence: Secondary | ICD-10-CM | POA: Insufficient documentation

## 2020-08-18 DIAGNOSIS — Z95810 Presence of automatic (implantable) cardiac defibrillator: Secondary | ICD-10-CM | POA: Diagnosis not present

## 2020-08-18 DIAGNOSIS — Z7902 Long term (current) use of antithrombotics/antiplatelets: Secondary | ICD-10-CM | POA: Diagnosis not present

## 2020-08-18 DIAGNOSIS — Z8249 Family history of ischemic heart disease and other diseases of the circulatory system: Secondary | ICD-10-CM | POA: Insufficient documentation

## 2020-08-18 DIAGNOSIS — I110 Hypertensive heart disease with heart failure: Secondary | ICD-10-CM | POA: Diagnosis not present

## 2020-08-18 DIAGNOSIS — Z7901 Long term (current) use of anticoagulants: Secondary | ICD-10-CM | POA: Diagnosis not present

## 2020-08-18 DIAGNOSIS — I2510 Atherosclerotic heart disease of native coronary artery without angina pectoris: Secondary | ICD-10-CM | POA: Diagnosis not present

## 2020-08-18 DIAGNOSIS — Z8673 Personal history of transient ischemic attack (TIA), and cerebral infarction without residual deficits: Secondary | ICD-10-CM | POA: Diagnosis not present

## 2020-08-18 DIAGNOSIS — E119 Type 2 diabetes mellitus without complications: Secondary | ICD-10-CM | POA: Insufficient documentation

## 2020-08-18 DIAGNOSIS — Z7984 Long term (current) use of oral hypoglycemic drugs: Secondary | ICD-10-CM | POA: Diagnosis not present

## 2020-08-18 DIAGNOSIS — Z7982 Long term (current) use of aspirin: Secondary | ICD-10-CM | POA: Insufficient documentation

## 2020-08-18 NOTE — Patient Instructions (Signed)
No medication changes today!  Your physician recommends that you return for a follow up appointment in 3 months  If you have any questions or concerns before your next appointment please send [place] a message through [place] or call our office at [phone].    TO LEAVE A MESSAGE FOR THE NURSE SELECT OPTION 2, PLEASE LEAVE A MESSAGE INCLUDING:  YOUR NAME  DATE OF BIRTH  CALL BACK NUMBER  REASON FOR CALL**this is important as we prioritize the call backs  YOU WILL RECEIVE A CALL BACK THE SAME DAY AS LONG AS YOU CALL BEFORE 4:00 PM  At the [facility], you and your health needs are our priority. As part of our continuing mission to provide you with exceptional heart care, we have created designated Provider Care Teams. These Care Teams include your primary Cardiologist (physician) and Advanced Practice Providers (APPs- Physician Assistants and Nurse Practitioners) who all work together to provide you with the care you need, when you need it.   You may see any of the following providers on your designated Care Team at your next follow up:  Dr Glori Bickers  Dr Haynes Kerns, NP  Lyda Jester, [place]  Audry Riles, PharmD   Please be sure to bring in all your medications bottles to every appointment.

## 2020-08-22 ENCOUNTER — Telehealth: Payer: Self-pay | Admitting: *Deleted

## 2020-08-22 ENCOUNTER — Observation Stay (HOSPITAL_COMMUNITY)
Admission: AD | Admit: 2020-08-22 | Discharge: 2020-08-23 | DRG: 303 | Disposition: A | Discharge status: Home / Self Care | Payer: Medicaid Other | Source: Other Acute Inpatient Hospital | Attending: Internal Medicine | Admitting: Internal Medicine

## 2020-08-22 DIAGNOSIS — Z955 Presence of coronary angioplasty implant and graft: Secondary | ICD-10-CM

## 2020-08-22 DIAGNOSIS — Z7901 Long term (current) use of anticoagulants: Secondary | ICD-10-CM | POA: Diagnosis not present

## 2020-08-22 DIAGNOSIS — N179 Acute kidney failure, unspecified: Secondary | ICD-10-CM | POA: Diagnosis not present

## 2020-08-22 DIAGNOSIS — Z87891 Personal history of nicotine dependence: Secondary | ICD-10-CM | POA: Diagnosis not present

## 2020-08-22 DIAGNOSIS — Z91014 Allergy to mammalian meats: Secondary | ICD-10-CM | POA: Diagnosis not present

## 2020-08-22 DIAGNOSIS — I25110 Atherosclerotic heart disease of native coronary artery with unstable angina pectoris: Secondary | ICD-10-CM | POA: Diagnosis not present

## 2020-08-22 DIAGNOSIS — I255 Ischemic cardiomyopathy: Secondary | ICD-10-CM | POA: Diagnosis not present

## 2020-08-22 DIAGNOSIS — I110 Hypertensive heart disease with heart failure: Secondary | ICD-10-CM | POA: Diagnosis not present

## 2020-08-22 DIAGNOSIS — Z20822 Contact with and (suspected) exposure to covid-19: Secondary | ICD-10-CM | POA: Diagnosis not present

## 2020-08-22 DIAGNOSIS — F32A Depression, unspecified: Secondary | ICD-10-CM | POA: Diagnosis present

## 2020-08-22 DIAGNOSIS — Z7982 Long term (current) use of aspirin: Secondary | ICD-10-CM

## 2020-08-22 DIAGNOSIS — Z95810 Presence of automatic (implantable) cardiac defibrillator: Secondary | ICD-10-CM | POA: Diagnosis present

## 2020-08-22 DIAGNOSIS — E1151 Type 2 diabetes mellitus with diabetic peripheral angiopathy without gangrene: Secondary | ICD-10-CM | POA: Diagnosis present

## 2020-08-22 DIAGNOSIS — I2510 Atherosclerotic heart disease of native coronary artery without angina pectoris: Secondary | ICD-10-CM

## 2020-08-22 DIAGNOSIS — I200 Unstable angina: Secondary | ICD-10-CM | POA: Diagnosis present

## 2020-08-22 DIAGNOSIS — Z8249 Family history of ischemic heart disease and other diseases of the circulatory system: Secondary | ICD-10-CM

## 2020-08-22 DIAGNOSIS — I5022 Chronic systolic (congestive) heart failure: Secondary | ICD-10-CM | POA: Diagnosis present

## 2020-08-22 DIAGNOSIS — I513 Intracardiac thrombosis, not elsewhere classified: Secondary | ICD-10-CM | POA: Diagnosis present

## 2020-08-22 DIAGNOSIS — M6282 Rhabdomyolysis: Secondary | ICD-10-CM | POA: Diagnosis present

## 2020-08-22 DIAGNOSIS — G4730 Sleep apnea, unspecified: Secondary | ICD-10-CM | POA: Diagnosis present

## 2020-08-22 DIAGNOSIS — Z823 Family history of stroke: Secondary | ICD-10-CM

## 2020-08-22 DIAGNOSIS — F419 Anxiety disorder, unspecified: Secondary | ICD-10-CM | POA: Diagnosis present

## 2020-08-22 DIAGNOSIS — I252 Old myocardial infarction: Secondary | ICD-10-CM | POA: Diagnosis not present

## 2020-08-22 DIAGNOSIS — Z23 Encounter for immunization: Secondary | ICD-10-CM

## 2020-08-22 DIAGNOSIS — M5430 Sciatica, unspecified side: Secondary | ICD-10-CM | POA: Diagnosis present

## 2020-08-22 DIAGNOSIS — M1990 Unspecified osteoarthritis, unspecified site: Secondary | ICD-10-CM | POA: Diagnosis present

## 2020-08-22 DIAGNOSIS — K219 Gastro-esophageal reflux disease without esophagitis: Secondary | ICD-10-CM | POA: Diagnosis not present

## 2020-08-22 DIAGNOSIS — E782 Mixed hyperlipidemia: Secondary | ICD-10-CM | POA: Diagnosis present

## 2020-08-22 DIAGNOSIS — Z8673 Personal history of transient ischemic attack (TIA), and cerebral infarction without residual deficits: Secondary | ICD-10-CM | POA: Diagnosis not present

## 2020-08-22 DIAGNOSIS — E7800 Pure hypercholesterolemia, unspecified: Secondary | ICD-10-CM | POA: Diagnosis present

## 2020-08-22 DIAGNOSIS — Z7984 Long term (current) use of oral hypoglycemic drugs: Secondary | ICD-10-CM

## 2020-08-22 DIAGNOSIS — I236 Thrombosis of atrium, auricular appendage, and ventricle as current complications following acute myocardial infarction: Secondary | ICD-10-CM | POA: Diagnosis present

## 2020-08-22 DIAGNOSIS — Z7902 Long term (current) use of antithrombotics/antiplatelets: Secondary | ICD-10-CM | POA: Diagnosis not present

## 2020-08-22 DIAGNOSIS — Z79899 Other long term (current) drug therapy: Secondary | ICD-10-CM

## 2020-08-22 LAB — GLUCOSE, CAPILLARY: Glucose-Capillary: 106 mg/dL — ABNORMAL HIGH (ref 70–99)

## 2020-08-22 LAB — HEPARIN LEVEL (UNFRACTIONATED): Heparin Unfractionated: <0.1 IU/mL — ABNORMAL LOW (ref 0.30–0.70)

## 2020-08-22 LAB — TROPONIN I (HIGH SENSITIVITY): Troponin I (High Sensitivity): 102 ng/L (ref <18)

## 2020-08-22 MED ORDER — ISOSORBIDE MONONITRATE ER 60 MG PO TB24
60.0000 mg | ORAL_TABLET | Freq: Every day | ORAL | Status: DC
  Administered 2020-08-23: 60 mg via ORAL
  Filled 2020-08-22: qty 1

## 2020-08-22 MED ORDER — EZETIMIBE 10 MG PO TABS
10.0000 mg | ORAL_TABLET | Freq: Every day | ORAL | Status: DC
  Administered 2020-08-23: 10 mg via ORAL
  Filled 2020-08-22: qty 1

## 2020-08-22 MED ORDER — FENOFIBRATE 160 MG PO TABS
160.0000 mg | ORAL_TABLET | Freq: Every day | ORAL | Status: DC
  Administered 2020-08-23: 160 mg via ORAL
  Filled 2020-08-22: qty 1

## 2020-08-22 MED ORDER — INSULIN ASPART 100 UNIT/ML ~~LOC~~ SOLN: 0.0000 [IU] | Freq: Three times a day (TID) | SUBCUTANEOUS | Status: DC

## 2020-08-22 MED ORDER — CARVEDILOL 6.25 MG PO TABS
6.2500 mg | ORAL_TABLET | Freq: Two times a day (BID) | ORAL | Status: DC
  Administered 2020-08-23: 6.25 mg via ORAL
  Filled 2020-08-22: qty 1

## 2020-08-22 MED ORDER — CLOPIDOGREL BISULFATE 75 MG PO TABS
75.0000 mg | ORAL_TABLET | Freq: Every day | ORAL | Status: DC
  Administered 2020-08-23: 75 mg via ORAL
  Filled 2020-08-22: qty 1

## 2020-08-22 MED ORDER — ACETAMINOPHEN 325 MG PO TABS: 650.0000 mg | ORAL_TABLET | ORAL | Status: DC | PRN

## 2020-08-22 MED ORDER — HEPARIN (PORCINE) 25000 UT/250ML-% IV SOLN
1150.0000 [IU]/h | INTRAVENOUS | Status: DC
  Filled 2020-08-22: qty 250

## 2020-08-22 MED ORDER — ASPIRIN EC 81 MG PO TBEC
81.0000 mg | DELAYED_RELEASE_TABLET | Freq: Every day | ORAL | Status: DC
  Administered 2020-08-23: 81 mg via ORAL
  Filled 2020-08-22: qty 1

## 2020-08-22 MED ORDER — NITROGLYCERIN 0.4 MG SL SUBL: 0.4000 mg | SUBLINGUAL_TABLET | SUBLINGUAL | Status: DC | PRN

## 2020-08-22 MED ORDER — PANTOPRAZOLE SODIUM 40 MG PO TBEC
40.0000 mg | DELAYED_RELEASE_TABLET | Freq: Every day | ORAL | Status: DC
  Administered 2020-08-23: 40 mg via ORAL
  Filled 2020-08-22: qty 1

## 2020-08-22 MED ORDER — SPIRONOLACTONE 25 MG PO TABS: 25.0000 mg | ORAL_TABLET | Freq: Every day | ORAL | Status: DC

## 2020-08-22 MED ORDER — LOSARTAN POTASSIUM 25 MG PO TABS: 25.0000 mg | ORAL_TABLET | Freq: Every day | ORAL | Status: DC

## 2020-08-22 MED ORDER — EMPAGLIFLOZIN 10 MG PO TABS
10.0000 mg | ORAL_TABLET | Freq: Every day | ORAL | Status: DC
  Administered 2020-08-23: 10 mg via ORAL
  Filled 2020-08-22: qty 1

## 2020-08-22 MED ORDER — DIGOXIN 125 MCG PO TABS
0.1250 mg | ORAL_TABLET | Freq: Every day | ORAL | Status: DC
  Administered 2020-08-23: 0.125 mg via ORAL
  Filled 2020-08-22: qty 1

## 2020-08-22 MED ORDER — ONDANSETRON HCL 4 MG/2ML IJ SOLN: 4.0000 mg | Freq: Four times a day (QID) | INTRAMUSCULAR | Status: DC | PRN

## 2020-08-22 MED ORDER — GABAPENTIN 100 MG PO CAPS
100.0000 mg | ORAL_CAPSULE | Freq: Two times a day (BID) | ORAL | Status: DC
  Administered 2020-08-22 – 2020-08-23 (×2): 100 mg via ORAL
  Filled 2020-08-22 (×2): qty 1

## 2020-08-22 MED ORDER — GLIPIZIDE 10 MG PO TABS
10.0000 mg | ORAL_TABLET | Freq: Two times a day (BID) | ORAL | Status: DC
  Administered 2020-08-23: 10 mg via ORAL
  Filled 2020-08-22 (×3): qty 1

## 2020-08-22 NOTE — Plan of Care (Signed)

## 2020-08-22 NOTE — Progress Notes (Signed)
MD informed of patient arrival to input orders. Patient positioned in bed given call light. Cont's with Heparin GTT no difficulty. Placed on Tele. Vs stable patient awake talking on phone no distress noted.

## 2020-08-22 NOTE — Progress Notes (Signed)
ANTICOAGULATION CONSULT NOTE - Initial Consult  Pharmacy Consult for IV heparin Indication: chest pain/ACS  Allergies  Allergen Reactions  . Pork-Derived Products Other (See Comments)    Does not eat for religious reasons - okay with using IV heparin    Patient Measurements: Height: 5\' 6"  (167.6 cm) Weight: 70.7 kg (155 lb 13.8 oz) IBW/kg (Calculated) : 63.8 Heparin Dosing Weight: 70.7 kg  Vital Signs: Temp: 98.8 F (37.1 C) (10/25 2025) Temp Source: Oral (10/25 2025) BP: 96/70 (10/25 2025) Pulse Rate: 69 (10/25 2025)  Labs: Recent Labs    08/22/20 2048  HEPARINUNFRC <0.10*    CrCl cannot be calculated (Patient's most recent lab result is older than the maximum 21 days allowed.).   Medical History: Past Medical History:  Diagnosis Date  . AICD (automatic cardioverter/defibrillator) present   . Anxiety   . Arthritis    "all over" (09/17/2017)  . Burn   . CAD (coronary artery disease)    a. NSTEMI s/p BMS to 1st Diagonal and distal OM2 in 2007; b. STEMI 03/26/12 s/p BMS to RCA; c. NSTEMI 10/2012 : CTO of LCx (unable to open with PCI) and PL branch, mod dz of LAD and diagonal, and preserved LV systolic fxn, Med Rx;  d.  anterior STEMI in 01/2017 with DES to Proximal LAD  . Cardiomyopathy EF 35% on cath 06/30/14, new from jan 2015 07/20/2014  . Chest pain   . CHF (congestive heart failure) ([place])   . Chronic back pain    "all over" (09/17/2017)  . DDD (degenerative disc disease), cervical   . Depression   . GERD (gastroesophageal reflux disease)   . Headache    "a few/wk" (09/17/2017)  . HTN (hypertension)   . Hypercholesterolemia   . Mental disorder   . Myocardial infarction ([place])    "I've had 7" (09/17/2017)  . Pulmonary edema   . Respiratory failure ([place])   . Sciatic pain   . Sleep apnea   . Stroke [facility])    a. multiple dating back to 2002; *I''ve had 5; LUE/LLE weaker since" (09/17/2017)  . Tibia fracture (l) leg  . Tobacco abuse   . Type 2 diabetes  mellitus ([place])   . Unstable angina ([facility])     Medications:  Infusions:  . heparin 850 Units/hr (08/22/20 2033)    Assessment: 64 yo male admitted from OSH with chest pain on IV heparin at 840 units/hr. Heparin level is undetectable on this rate.  No overt bleeding or complications noted.  Goal of Therapy:  Heparin level 0.3-0.7 units/ml Monitor platelets by anticoagulation protocol: Yes   Plan:  Increase IV heparin to 1050 units/hr. Recheck heparin level with AM labs. Daily heparin level and CBC.  [place] Crane, Roylene Reason, BCCP Clinical Pharmacist  08/22/2020 9:45 PM   [facility] pharmacy phone numbers are listed on [email or web address]
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
## 2020-08-23 ENCOUNTER — Other Ambulatory Visit: Payer: Self-pay

## 2020-08-23 ENCOUNTER — Encounter (HOSPITAL_COMMUNITY): Payer: Self-pay | Admitting: Internal Medicine

## 2020-08-23 DIAGNOSIS — I200 Unstable angina: Secondary | ICD-10-CM | POA: Diagnosis not present

## 2020-08-23 DIAGNOSIS — I25110 Atherosclerotic heart disease of native coronary artery with unstable angina pectoris: Secondary | ICD-10-CM | POA: Diagnosis not present

## 2020-08-23 DIAGNOSIS — Z23 Encounter for immunization: Secondary | ICD-10-CM | POA: Diagnosis not present

## 2020-08-23 DIAGNOSIS — I5022 Chronic systolic (congestive) heart failure: Secondary | ICD-10-CM | POA: Diagnosis not present

## 2020-08-23 DIAGNOSIS — I2510 Atherosclerotic heart disease of native coronary artery without angina pectoris: Secondary | ICD-10-CM

## 2020-08-23 DIAGNOSIS — N179 Acute kidney failure, unspecified: Secondary | ICD-10-CM | POA: Diagnosis not present

## 2020-08-23 HISTORY — DX: Atherosclerotic heart disease of native coronary artery without angina pectoris: I25.10

## 2020-08-23 LAB — CBC
HCT: 42.8 % (ref 39.0–52.0)
Hemoglobin: 13.4 g/dL (ref 13.0–17.0)
MCH: 25.2 pg — ABNORMAL LOW (ref 26.0–34.0)
MCHC: 31.3 g/dL (ref 30.0–36.0)
MCV: 80.6 fL (ref 80.0–100.0)
Platelets: 269 10*3/uL (ref 150–400)
RBC: 5.31 MIL/uL (ref 4.22–5.81)
RDW: 16 % — ABNORMAL HIGH (ref 11.5–15.5)
WBC: 5.5 10*3/uL (ref 4.0–10.5)
nRBC: 0 % (ref 0.0–0.2)

## 2020-08-23 LAB — BASIC METABOLIC PANEL
Anion gap: 8 (ref 5–15)
BUN: 11 mg/dL (ref 8–23)
CO2: 28 mmol/L (ref 22–32)
Calcium: 9.6 mg/dL (ref 8.9–10.3)
Chloride: 102 mmol/L (ref 98–111)
Creatinine, Ser: 1.27 mg/dL — ABNORMAL HIGH (ref 0.61–1.24)
GFR, Estimated: >60 mL/min (ref >60)
Glucose, Bld: 98 mg/dL (ref 70–99)
Potassium: 4.3 mmol/L (ref 3.5–5.1)
Sodium: 138 mmol/L (ref 135–145)

## 2020-08-23 LAB — MRSA PCR SCREENING: MRSA by PCR: NEGATIVE

## 2020-08-23 LAB — HEMOGLOBIN A1C
Hgb A1c MFr Bld: 6.2 % — ABNORMAL HIGH (ref 4.8–5.6)
Mean Plasma Glucose: 131 mg/dL

## 2020-08-23 LAB — GLUCOSE, CAPILLARY
Glucose-Capillary: 95 mg/dL (ref 70–99)
Glucose-Capillary: 74 mg/dL (ref 70–99)

## 2020-08-23 LAB — TROPONIN I (HIGH SENSITIVITY): Troponin I (High Sensitivity): 97 ng/L — ABNORMAL HIGH (ref <18)

## 2020-08-23 LAB — PROTIME-INR
INR: 2.3 — ABNORMAL HIGH (ref 0.8–1.2)
Prothrombin Time: 24.9 seconds — ABNORMAL HIGH (ref 11.4–15.2)

## 2020-08-23 LAB — HEPARIN LEVEL (UNFRACTIONATED): Heparin Unfractionated: 0.27 IU/mL — ABNORMAL LOW (ref 0.30–0.70)

## 2020-08-23 MED ORDER — WARFARIN - PHARMACIST DOSING INPATIENT: Freq: Every day | Status: DC

## 2020-08-23 MED ORDER — INFLUENZA VAC SPLIT QUAD 0.5 ML IM SUSY
0.5000 mL | PREFILLED_SYRINGE | INTRAMUSCULAR | Status: AC
  Administered 2020-08-23: 0.5 mL via INTRAMUSCULAR
  Filled 2020-08-23: qty 0.5

## 2020-08-23 MED ORDER — ISOSORBIDE MONONITRATE ER 60 MG PO TB24: 60.0000 mg | ORAL_TABLET | Freq: Every day | ORAL | 6 refills | Status: DC

## 2020-08-23 MED ORDER — CLOPIDOGREL BISULFATE 75 MG PO TABS: 75.0000 mg | ORAL_TABLET | Freq: Every day | ORAL | 3 refills | Status: DC

## 2020-08-23 MED ORDER — PNEUMOCOCCAL VAC POLYVALENT 25 MCG/0.5ML IJ INJ
0.5000 mL | INJECTION | INTRAMUSCULAR | Status: AC
  Administered 2020-08-23: 0.5 mL via INTRAMUSCULAR
  Filled 2020-08-23: qty 0.5

## 2020-08-23 MED ORDER — WARFARIN SODIUM 7.5 MG PO TABS: 7.5000 mg | ORAL_TABLET | ORAL | Status: DC

## 2020-08-23 MED ORDER — WARFARIN SODIUM 5 MG PO TABS: 5.0000 mg | ORAL_TABLET | Freq: Every day | ORAL | 6 refills | Status: DC

## 2020-08-23 MED ORDER — WARFARIN SODIUM 7.5 MG PO TABS: 7.5000 mg | ORAL_TABLET | Freq: Every day | ORAL | 6 refills | Status: DC

## 2020-08-23 MED ORDER — WARFARIN SODIUM 5 MG PO TABS: 5.0000 mg | ORAL_TABLET | ORAL | Status: DC

## 2020-08-23 MED ORDER — CARVEDILOL 3.125 MG PO TABS: 6.2500 mg | ORAL_TABLET | Freq: Two times a day (BID) | ORAL | 6 refills | Status: DC

## 2020-08-23 NOTE — Progress Notes (Signed)
Patient troponin 102. Patient denies chest pain or shortness of breath at present time.Text paged Dr. Sonia Side. No new orders received.

## 2020-08-23 NOTE — Discharge Instructions (Signed)
We increased your imdur -  Heart healthy diabetic low salt diet  You will need to be on warfarin (coumadin) and plavix, no Asprin   Weigh daily if weight climbs more than 3 pounds in a day or 5 pounds in a week call Dr. Claris Gladden office.   Coumadin check on Thursday in [place].

## 2020-08-23 NOTE — Discharge Summary (Addendum)
Discharge Summary    Patient ID: Daniel Li MRN: [id number]; DOB: 1956-02-16  Admit date: 08/22/2020 Discharge date: 08/23/2020  Primary Care Provider: [name], NP  Primary Cardiologist: Carlyle Dolly, MD  Primary Electrophysiologist:  Thompson Grayer, MD  AHF:  Dr. Aundra Dubin   Discharge Diagnoses    Principal Problem:   Unstable angina [facility]) Active Problems:   Hypercholesterolemia   Ischemic cardiomyopathy   Post-infarction thrombus of left ventricle (HCC)   Chronic systolic CHF (congestive heart failure) ([place])   ICD (implantable cardioverter-defibrillator) in place   Anticoagulated on Coumadin   AKI (acute kidney injury) ([place])   CAD in native artery   Diagnostic Studies/Procedures    Prior cath  __9/23/21 Cardiac cath 1. Low filling pressures.  2. Low but not markedly low cardiac output.  3. Coronaries unchanged from prior. Occluded PLV and proximal LCx with collaterals. Moderate stenoses in D2 and PDA appear unchanged.   No good interventional target, medical management___________  Prior echo 08/04/20 IMPRESSIONS    1. Left ventricular ejection fraction, by estimation, is approximately  25%. The left ventricle has severely decreased function. The left  ventricle demonstrates regional wall motion abnormalities (see scoring  diagram/findings for description). The left  ventricular internal cavity size was mildly dilated. Left ventricular  diastolic parameters are consistent with Grade I diastolic dysfunction  (impaired relaxation). No obvious LV mural thrombus.  2. Right ventricular systolic function is normal. The right ventricular  size is normal. A device wire is visualized. There is normal pulmonary  artery systolic pressure. The estimated right ventricular systolic  pressure is 77.8 mmHg.  3. Left atrial size was mildly dilated.  4. The mitral valve is abnormal. Mild mitral valve regurgitation,  directed toward the septum. There is a  freely mobile portion of the mitral  chordal apparatus noted, affixed to anterior leafet and likely  representing a portion of ruptured chordae.  5. The aortic valve is tricuspid. Aortic valve regurgitation is not  visualized.  6. The inferior vena cava is normal in size with greater than 50%  respiratory variability, suggesting right atrial pressure of 3 mmHg.   FINDINGS  Left Ventricle: Left ventricular ejection fraction, by estimation, is  25%. The left ventricle has severely decreased function. The left  ventricle demonstrates regional wall motion abnormalities. The left  ventricular internal cavity size was mildly  dilated. There is no left ventricular hypertrophy. Left ventricular  diastolic parameters are consistent with Grade I diastolic dysfunction  (impaired relaxation).     LV Wall Scoring:  The mid and distal lateral wall, mid and distal anterior septum, entire  apex,  and mid inferoseptal segment are akinetic. The anterior wall, inferior  wall,  basal anteroseptal segment, basal inferolateral segment, mid anterolateral  segment, and basal inferoseptal segment are hypokinetic. The basal  anterolateral segment is normal.   Right Ventricle: The right ventricular size is normal. No increase in  right ventricular wall thickness. Right ventricular systolic function is  normal. There is normal pulmonary artery systolic pressure. The tricuspid  regurgitant velocity is 2.05 m/s, and  with an assumed right atrial pressure of 3 mmHg, the estimated right  ventricular systolic pressure is 24.2 mmHg.   Left Atrium: Left atrial size was mildly dilated.   Right Atrium: Right atrial size was normal in size.   Pericardium: There is no evidence of pericardial effusion.   Mitral Valve: The mitral valve is abnormal. Mild mitral valve  regurgitation.   Tricuspid Valve: The tricuspid valve is  grossly normal. Tricuspid valve  regurgitation is trivial.   Aortic Valve: The  aortic valve is tricuspid. Aortic valve regurgitation is  not visualized.   Pulmonic Valve: The pulmonic valve was grossly normal. Pulmonic valve  regurgitation is trivial.   Aorta: The aortic root is normal in size and structure.   Venous: The inferior vena cava is normal in size with greater than 50%  respiratory variability, suggesting right atrial pressure of 3 mmHg.   IAS/Shunts: No atrial level shunt detected by color flow Doppler.   Additional Comments: A device wire is visualized.     LEFT VENTRICLE  PLAX 2D  LVIDd:     6.06 cm   Diastology  LVIDs:     4.91 cm   LV e' medial:  3.77 cm/s  LV PW:     0.82 cm   LV E/e' medial: 11.1  LV IVS:    0.66 cm   LV e' lateral:  3.67 cm/s  LVOT diam:   1.90 cm   LV E/e' lateral: 11.4  LV SV:     39  LV SV Index:  21  LVOT Area:   2.84 cm    LV Volumes (MOD)  LV vol d, MOD A2C: 188.0 ml  LV vol d, MOD A4C: 241.0 ml  LV vol s, MOD A2C: 114.0 ml  LV vol s, MOD A4C: 155.0 ml  LV SV MOD A2C:   74.0 ml  LV SV MOD A4C:   241.0 ml  LV SV MOD BP:   77.5 ml   RIGHT VENTRICLE  RV S prime:   10.70 cm/s  TAPSE (M-mode): 1.5 cm   LEFT ATRIUM       Index  LA diam:    3.30 cm 1.81 cm/m  LA Vol (A2C):  74.9 ml 41.18 ml/m  LA Vol (A4C):  62.1 ml 34.14 ml/m  LA Biplane Vol:     39.60 ml/m  AORTIC VALVE  LVOT Vmax:  82.20 cm/s  LVOT Vmean: 59.900 cm/s  LVOT VTI:  0.137 m    History of Present Illness     Daniel Li is a 64 y.o. male with a history of CAD status post prior non-STEMI and STEMI's with multiple stents, ischemic cardiomyopathy, HFrEF, hypertension, hyperlipidemia, tobacco abuse, diabetes, sleep apnea, CVA, LV thrombus on Coumadin, PAD, and GERD who presented to 3E at [facility] on transfer from [facility] in the setting of atypical chest pain and mild troponin elevation.    He has statin intolerance due to rhabdomyolysis.  In Sept this  year he had chest pain and cad cardiac cath with patent proximal and mid LAD stents, patent stent in the D2 with a 60% mid stenosis, and occluded left circumflex with robust left to left collaterals, minor irregularities in the RCA with an occluded mid PLV with right to right collaterals, and 50 to 60% stenosis in the mid PDA.  Right heart pressures were normal.  Cardiac output was 3.86 with an index of 2.12.--plan to treat medically.   Echo 08/04/20 with EF 25% with G1DD, he was doing well 08/18/20.    He did well until 10/23 and developed mild knocking/throbing like chest discomfort.  No associated symptoms.  On the 24th he noted Rt sided abd pain worse with palpation and rotation of upper body.  He then went to ER.  EKG with SR and inferolateral ST depression and T wave inversion.  Troponin 53,94, 294 and now decreased.  Morphine improved symptoms.  He  was transferred to [facility] for further eval.  On arrival with mild chest discomfort. No associated symptoms.  His Cr was elevated to 1.54 in outside hospital now back to pre cath of 1.27 .   Hospital Course     Consultants: none   After arrival his imdur was increased.  Today pt is feeling well.  His INR was low on admit and placed on IV heparin.  He was supposed to be on coumadin, ASA and plavix. There is question if he was taking.     With imdur increase BP is slightly lower.  But he is asymptomatic.   His diabetes is stable will continue current meds.   Today INR is 2.3 will stop Heparin and stop ASA discharge with coumadin and plavix.   He will need INR checked THursday . Pt has been seen and evaluated by Dr. Debara Pickett and found stable for discharge.  Will return to his SNF. [facility] in [place]  Did the patient have an acute coronary syndrome (MI, NSTEMI, STEMI, etc) this admission?:  No.   The elevated Troponin was due to the acute medical illness (demand ischemia).  _____________  Discharge Vitals Blood pressure 97/69, pulse 76, temperature 98  F (36.7 C), temperature source Oral, resp. rate 17, height 5\' 6"  (1.676 m), weight 71.1 kg, SpO2 98 %.  Filed Weights   08/22/20 1759 08/23/20 0412  Weight: 70.7 kg 71.1 kg    Labs & Radiologic Studies    CBC Recent Labs    08/23/20 0423  WBC 5.5  HGB 13.4  HCT 42.8  MCV 80.6  PLT 237   Basic Metabolic Panel Recent Labs    08/23/20 0423  NA 138  K 4.3  CL 102  CO2 28  GLUCOSE 98  BUN 11  CREATININE 1.27*  CALCIUM 9.6   Liver Function Tests No results for input(s): AST, ALT, ALKPHOS, BILITOT, PROT, ALBUMIN in the last 72 hours. No results for input(s): LIPASE, AMYLASE in the last 72 hours. High Sensitivity Troponin:   Recent Labs  Lab 08/22/20 2048 08/23/20 0423  TROPONINIHS 102* 97*    BNP Invalid input(s): POCBNP D-Dimer No results for input(s): DDIMER in the last 72 hours. Hemoglobin A1C No results for input(s): HGBA1C in the last 72 hours. Fasting Lipid Panel No results for input(s): CHOL, HDL, LDLCALC, TRIG, CHOLHDL, LDLDIRECT in the last 72 hours. Thyroid Function Tests No results for input(s): TSH, T4TOTAL, T3FREE, THYROIDAB in the last 72 hours.  Invalid input(s): FREET3 _____________  VAS [place] ABI WITH/WO TBI  Result Date: 08/04/2020 LOWER EXTREMITY DOPPLER STUDY Indications: Claudication, and Numbness and tingling. High Risk Factors: Hypertension, hyperlipidemia, Diabetes, past history of                    smoking, prior MI, coronary artery disease, prior CVA. Other Factors: Patient complains of pain and fatigue in his legs and chronic                cold feet. [name] Kitchen He states its mainly in his left leg and has been                going on since his stroke that affected the left side.  Performing Technologist: McFatter, Leafy Ro RVT  Examination Guidelines: A complete evaluation includes at minimum, Doppler waveform signals and systolic blood pressure reading at the level of bilateral brachial, anterior tibial, and posterior tibial arteries, when vessel  segments are accessible. Bilateral testing is considered an  integral part of a complete examination. Photoelectric Plethysmograph (PPG) waveforms and toe systolic pressure readings are included as required and additional duplex testing as needed. Limited examinations for reoccurring indications may be performed as noted.  ABI Findings: +---------+------------------+-----+---------+--------+ Right    Rt Pressure (mmHg)IndexWaveform Comment  +---------+------------------+-----+---------+--------+ Brachial 92                     triphasic         +---------+------------------+-----+---------+--------+ ATA      91                0.99 triphasic         +---------+------------------+-----+---------+--------+ PTA      88                0.96 triphasic         +---------+------------------+-----+---------+--------+ PERO     90                0.98 triphasic         +---------+------------------+-----+---------+--------+ Great Toe82                0.89 Abnormal          +---------+------------------+-----+---------+--------+ +---------+------------------+-----+---------+-------+ Left     Lt Pressure (mmHg)IndexWaveform Comment +---------+------------------+-----+---------+-------+ Brachial 90                     triphasic        +---------+------------------+-----+---------+-------+ ATA      115               1.25 triphasic        +---------+------------------+-----+---------+-------+ PTA      119               1.29 triphasic        +---------+------------------+-----+---------+-------+ PERO     120               1.30 triphasic        +---------+------------------+-----+---------+-------+ Great Toe91                0.99 Normal           +---------+------------------+-----+---------+-------+ +-------+-----------+-----------+------------+------------+ ABI/TBIToday's ABIToday's TBIPrevious ABIPrevious TBI  +-------+-----------+-----------+------------+------------+ Right  0.99       0.89       1.0         0.64         +-------+-----------+-----------+------------+------------+ Left   1.30       0.99       1.4         .95          +-------+-----------+-----------+------------+------------+  Bilateral ABIs appear essentially unchanged. Right TBIs appear increased.  Summary: Right: Resting right ankle-brachial index is within normal range. No evidence of significant right lower extremity arterial disease. The right toe-brachial index is normal. Left: Resting left ankle-brachial index is within normal range. No evidence of significant left lower extremity arterial disease. The left toe-brachial index is normal.  *See table(s) above for measurements and observations.  Electronically signed by Quay Burow MD on 08/04/2020 at 5:02:20 PM.    Final    ECHOCARDIOGRAM COMPLETE  Result Date: 08/04/2020    ECHOCARDIOGRAM REPORT   Patient Name:   Daniel Li Date of Exam: 08/04/2020 Medical Rec #:  [id number]           Height:       66.0 in Accession #:    [id number]  Weight:       160.0 lb Date of Birth:  01/29/1956           BSA:          1.819 m Patient Age:    19 years            BP:           100/56 mmHg Patient Gender: M                   HR:           73 bpm. Exam Location:  [place] Procedure: 2D Echo, Cardiac Doppler and Color Doppler Indications:     I50.42 CHF  History:         Patient has prior history of Echocardiogram examinations, most                  recent 08/26/2018. CHF and Cardiomyopathy, CAD and NSTEMI, ICD,                  Stroke; Risk Factors:Hypertension, Diabetes, Dyslipidemia and                  Former Smoker.  Sonographer:     Jeneen Montgomery RDMS, RVT, RDCS Referring Phys:  [place] Diagnosing Phys: Rozann Lesches MD IMPRESSIONS  1. Left ventricular ejection fraction, by estimation, is approximately 25%. The left ventricle has severely decreased function. The  left ventricle demonstrates regional wall motion abnormalities (see scoring diagram/findings for description). The left ventricular internal cavity size was mildly dilated. Left ventricular diastolic parameters are consistent with Grade I diastolic dysfunction (impaired relaxation). No obvious LV mural thrombus.  2. Right ventricular systolic function is normal. The right ventricular size is normal. A device wire is visualized. There is normal pulmonary artery systolic pressure. The estimated right ventricular systolic pressure is 02.7 mmHg.  3. Left atrial size was mildly dilated.  4. The mitral valve is abnormal. Mild mitral valve regurgitation, directed toward the septum. There is a freely mobile portion of the mitral chordal apparatus noted, affixed to anterior leafet and likely representing a portion of ruptured chordae.  5. The aortic valve is tricuspid. Aortic valve regurgitation is not visualized.  6. The inferior vena cava is normal in size with greater than 50% respiratory variability, suggesting right atrial pressure of 3 mmHg. FINDINGS  Left Ventricle: Left ventricular ejection fraction, by estimation, is 25%. The left ventricle has severely decreased function. The left ventricle demonstrates regional wall motion abnormalities. The left ventricular internal cavity size was mildly dilated. There is no left ventricular hypertrophy. Left ventricular diastolic parameters are consistent with Grade I diastolic dysfunction (impaired relaxation).  LV Wall Scoring: The mid and distal lateral wall, mid and distal anterior septum, entire apex, and mid inferoseptal segment are akinetic. The anterior wall, inferior wall, basal anteroseptal segment, basal inferolateral segment, mid anterolateral segment, and basal inferoseptal segment are hypokinetic. The basal anterolateral segment is normal. Right Ventricle: The right ventricular size is normal. No increase in right ventricular wall thickness. Right ventricular  systolic function is normal. There is normal pulmonary artery systolic pressure. The tricuspid regurgitant velocity is 2.05 m/s, and  with an assumed right atrial pressure of 3 mmHg, the estimated right ventricular systolic pressure is 74.1 mmHg. Left Atrium: Left atrial size was mildly dilated. Right Atrium: Right atrial size was normal in size. Pericardium: There is no evidence of pericardial effusion. Mitral Valve: The mitral valve is abnormal. Mild  mitral valve regurgitation. Tricuspid Valve: The tricuspid valve is grossly normal. Tricuspid valve regurgitation is trivial. Aortic Valve: The aortic valve is tricuspid. Aortic valve regurgitation is not visualized. Pulmonic Valve: The pulmonic valve was grossly normal. Pulmonic valve regurgitation is trivial. Aorta: The aortic root is normal in size and structure. Venous: The inferior vena cava is normal in size with greater than 50% respiratory variability, suggesting right atrial pressure of 3 mmHg. IAS/Shunts: No atrial level shunt detected by color flow Doppler. Additional Comments: A device wire is visualized.  LEFT VENTRICLE PLAX 2D LVIDd:         6.06 cm      Diastology LVIDs:         4.91 cm      LV e' medial:    3.77 cm/s LV PW:         0.82 cm      LV E/e' medial:  11.1 LV IVS:        0.66 cm      LV e' lateral:   3.67 cm/s LVOT diam:     1.90 cm      LV E/e' lateral: 11.4 LV SV:         39 LV SV Index:   21 LVOT Area:     2.84 cm  LV Volumes (MOD) LV vol d, MOD A2C: 188.0 ml LV vol d, MOD A4C: 241.0 ml LV vol s, MOD A2C: 114.0 ml LV vol s, MOD A4C: 155.0 ml LV SV MOD A2C:     74.0 ml LV SV MOD A4C:     241.0 ml LV SV MOD BP:      77.5 ml RIGHT VENTRICLE RV S prime:     10.70 cm/s TAPSE (M-mode): 1.5 cm LEFT ATRIUM             Index LA diam:        3.30 cm 1.81 cm/m LA Vol (A2C):   74.9 ml 41.18 ml/m LA Vol (A4C):   62.1 ml 34.14 ml/m LA Biplane Vol:         39.60 ml/m  AORTIC VALVE LVOT Vmax:   82.20 cm/s LVOT Vmean:  59.900 cm/s LVOT VTI:    0.137 m   AORTA Ao Root diam: 3.70 cm Ao Asc diam:  3.50 cm MITRAL VALVE                 TRICUSPID VALVE MV Area (PHT):               TR Peak grad:   16.8 mmHg MV Decel Time: 143 msec      TR Vmax:        205.00 cm/s MR Peak grad:    70.6 mmHg MR Mean grad:    39.0 mmHg   SHUNTS MR Vmax:         420.00 cm/s Systemic VTI:  0.14 m MR Vmean:        290.0 cm/s  Systemic Diam: 1.90 cm MR PISA Eff ROA: 3 mm MV E velocity: 41.70 cm/s MV A velocity: 95.60 cm/s MV E/A ratio:  0.44 Rozann Lesches MD Electronically signed by Rozann Lesches MD Signature Date/Time: 08/04/2020/11:30:58 AM    Final    Disposition   Pt is being discharged home today in good condition.  Follow-up Plans & Appointments   We increased your imdur -  Heart healthy diabetic low salt diet  You will need to be on warfarin (coumadin) and plavix, no asprin  Weigh daily if weight climbs more than 3 pounds in a day or 5 pounds in a week call Dr. Claris Gladden office.    Follow-up Information    Arnoldo Lenis, MD Follow up on 09/05/2020.   Specialty: Cardiology Why: at 9:30 AM with his Nurse Practitioner Celene Squibb information: [place] [address] [address] Follow up on 08/25/2020.   Specialty: Cardiology Why: coumadin check at 3:45 pm   Contact information: [place] [phone]               Discharge Medications   Allergies as of 08/23/2020      Reactions   Pork-derived Products Other (See Comments)   Does not eat for religious reasons - okay with using IV heparin      Medication List    STOP taking these medications   aspirin EC 81 MG tablet   pantoprazole 40 MG tablet Commonly known as: PROTONIX     TAKE these medications   acetaminophen 325 MG tablet Commonly known as: TYLENOL Take 650 mg by mouth every 6 (six) hours as needed for mild pain, fever or headache.   carvedilol  3.125 MG tablet Commonly known as: COREG Take 2 tablets (6.25 mg total) by mouth 2 (two) times daily with a meal.   clopidogrel 75 MG tablet Commonly known as: PLAVIX Take 1 tablet (75 mg total) by mouth daily with breakfast.   digoxin 0.125 MG tablet Commonly known as: LANOXIN Take 1 tablet (0.125 mg total) by mouth daily.   ezetimibe 10 MG tablet Commonly known as: Zetia Take 1 tablet (10 mg total) by mouth daily.   fenofibrate 145 MG tablet Commonly known as: TRICOR TAKE 1 TABLET BY MOUTH DAILY.   gabapentin 100 MG capsule Commonly known as: NEURONTIN Take 100 mg by mouth 2 (two) times daily.   glipiZIDE 10 MG tablet Commonly known as: GLUCOTROL Take 10 mg by mouth 2 (two) times daily before a meal.   isosorbide mononitrate 60 MG 24 hr tablet Commonly known as: IMDUR Take 1 tablet (60 mg total) by mouth daily. Start taking on: August 24, 2020 What changed:   medication strength  how much to take   Jardiance 10 MG Tabs tablet Generic drug: empagliflozin Take 10 mg by mouth daily.   losartan 25 MG tablet Commonly known as: COZAAR Take 25 mg by mouth at bedtime.   meclizine 25 MG tablet Commonly known as: ANTIVERT Take 12.5 mg by mouth 3 (three) times daily as needed for dizziness.   metFORMIN 500 MG tablet Commonly known as: GLUCOPHAGE Take 1,000 mg by mouth 2 (two) times daily with a meal.   nitroGLYCERIN 0.4 MG SL tablet Commonly known as: NITROSTAT PLACE ONE (1) TABLET UNDER TONGUE EVERY 5 MINUTES UP TO (3) DOSES AS NEEDED FOR CHEST PAIN. IF NO RELIEF, CONTACT MD. What changed: See the new instructions.   potassium chloride SA 20 MEQ tablet Commonly known as: KLOR-CON Take 1 tablet (20 mEq total) by mouth daily.   Repatha SureClick 283 MG/ML [place] Generic drug: Evolocumab Inject 140 mg into the skin every 14 (fourteen) days.   spironolactone 25 MG tablet Commonly known as: ALDACTONE Take 1 tablet (25 mg total) by mouth at bedtime.   torsemide  20 MG tablet Commonly known as: DEMADEX Take 20 mg by mouth See admin instructions. Take one  tablet (20 mg) by mouth daily after supper, may also take one extra tablet (20 mg) daily as needed for weight gain > 3 lbs overnight or >5 lbs   warfarin 7.5 MG tablet Commonly known as: COUMADIN Take as directed. If you are unsure how to take this medication, talk to your nurse or doctor. Original instructions: Take 1 tablet (7.5 mg total) by mouth daily at 4 PM. On Sunday and Thursdays only What changed:   medication strength  how much to take  how to take this  when to take this  additional instructions   warfarin 5 MG tablet Commonly known as: COUMADIN Take as directed. If you are unsure how to take this medication, talk to your nurse or doctor. Original instructions: Take 1 tablet (5 mg total) by mouth daily at 4 PM for 1 dose. On Monday, Tuesday,Wed.,  Fridays  And Sat . 7.5 mg other days What changed: You were already taking a medication with the same name, and this prescription was added. Make sure you understand how and when to take each.          Outstanding Labs/Studies   BMP   Duration of Discharge Encounter   Greater than 30 minutes including physician time.  Signed, Cecilie Kicks, NP 08/23/2020, 1:53 PM

## 2020-08-23 NOTE — Progress Notes (Addendum)
Progress Note  Patient Name: Daniel Li Date of Encounter: 08/23/2020  [facility] Cardiologist: Carlyle Dolly, MD   Subjective   No chest pain and no SOB, no complaints  Inpatient Medications    Scheduled Meds: . aspirin EC  81 mg Oral Daily  . carvedilol  6.25 mg Oral BID WC  . clopidogrel  75 mg Oral Daily  . digoxin  0.125 mg Oral Daily  . empagliflozin  10 mg Oral Daily  . ezetimibe  10 mg Oral Daily  . fenofibrate  160 mg Oral Daily  . gabapentin  100 mg Oral BID  . glipiZIDE  10 mg Oral BID AC  . insulin aspart  0-15 Units Subcutaneous TID WC  . isosorbide mononitrate  60 mg Oral Daily  . losartan  25 mg Oral QHS  . pantoprazole  40 mg Oral Daily  . spironolactone  25 mg Oral QHS   Continuous Infusions: . heparin 1,150 Units/hr (08/23/20 0651)   PRN Meds: acetaminophen, nitroGLYCERIN, ondansetron (ZOFRAN) IV   Vital Signs    Vitals:   08/22/20 2336 08/23/20 0412 08/23/20 0733 08/23/20 0735  BP: 94/62 98/74 106/80 106/80  Pulse: 68 76    Resp: 18 20 17 17   Temp: 98.7 F (37.1 C) 98.1 F (36.7 C) 98.3 F (36.8 C)   TempSrc: Oral Oral Oral   SpO2: 96% 96% 98%   Weight:  71.1 kg    Height:        Intake/Output Summary (Last 24 hours) at 08/23/2020 0755 Last data filed at 08/23/2020 0600 Gross per 24 hour  Intake 363.99 ml  Output 1100 ml  Net -736.01 ml   Last 3 Weights 08/23/2020 08/22/2020 08/18/2020  Weight (lbs) 156 lb 12.8 oz 155 lb 13.8 oz 162 lb 3.2 oz  Weight (kg) 71.124 kg 70.7 kg 73.573 kg      Telemetry    SR occ PVC - Personally Reviewed  ECG    SR at 62,  ST depression in inf, and ant lateral leads  - Personally Reviewed  Physical Exam   GEN: No acute distress.   Neck: No JVD Cardiac: RRR, no murmurs, rubs, or gallops.  Respiratory: Clear to auscultation bilaterally. GI: Soft, nontender, non-distended  MS: No edema; No deformity. Neuro:  Nonfocal  Psych: Normal affect   Labs    High Sensitivity  Troponin:   Recent Labs  Lab 08/22/20 2048 08/23/20 0423  TROPONINIHS 102* 97*      Chemistry Recent Labs  Lab 08/23/20 0423  NA 138  K 4.3  CL 102  CO2 28  GLUCOSE 98  BUN 11  CREATININE 1.27*  CALCIUM 9.6  GFRNONAA >60  ANIONGAP 8     Hematology Recent Labs  Lab 08/23/20 0423  WBC 5.5  RBC 5.31  HGB 13.4  HCT 42.8  MCV 80.6  MCH 25.2*  MCHC 31.3  RDW 16.0*  PLT 269    BNPNo results for input(s): BNP, PROBNP in the last 168 hours.   DDimer No results for input(s): DDIMER in the last 168 hours.   Radiology    No results found.  Cardiac Studies  07/21/20 Cardiac cath 1. Low filling pressures.  2. Low but not markedly low cardiac output.  3. Coronaries unchanged from prior.  Occluded PLV and proximal LCx with collaterals.  Moderate stenoses in D2 and PDA appear unchanged.    No good interventional target, medical management    Patient Profile     64  y.o. 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  Assessment & Plan    # Atypical chest pain/demand ischemia/coronary artery disease: Patient with a history of CAD dating back at least 43 years with multiple percutaneous interventions.  Most recent catheterization on September 23 performed in the setting of report of exertional angina showed stable anatomy with patent LAD and second diagonal stents with an occlusion of the left circumflex with left to left collaterals as well as an occlusion of the RPL V with right to right collaterals.  Follow-up echo showed persistent LV dysfunction with an EF of 25%.  Approximately 2 days ago, patient began to experience a knocking like right-sided chest discomfort that was very  mild, followed by right flank and back pain that was worse with palpation, and position changes --ECG showed inferolateral ST depression with T wave inversion.  Inferior changes are slightly more pronounced than on prior ECGs.   --troponin 53 to 98 then 256  Here hs troponin 102 and 97 --IV heparin--coumadin on hold  --continue medical therapy.  ASA, BB, and will continue plavix.  Will decide prior to discharge plan.    # HFrEF/ischemic cardiomyopathy: EF 25% by recent echo --on BB, ARB, spironolactone and SGLT2 inhibitor  # HTN BP 94/74 to 106/80  --coreg 6.25 BID, imdur 60, losartan 25, aldactone     # HLD With statin intolerance secondary to rhabdomyolysis.  Repatha therapy as an outpatient on fenofibrate and zetia   # DM-2 on glipizide and empagliflozin on SSI here as well.   Glucose 106 to 95   # LV Thrombus on chronic coumadin, INR at 1.8 -coumadin on hold for possible invasive procedure.    # Acute kidney injury: Creatinine 1.54 at outside hospital --today 1.27   # GERD on PPI    For questions or updates, please contact [facility] Please consult [email or web address] for contact info under        Signed, Cecilie Kicks, NP  08/23/2020, 7:55 AM

## 2020-08-23 NOTE — Progress Notes (Signed)
[place] for IV Heparin>Coumadin Indication: LV thrombus  Allergies  Allergen Reactions  . Pork-Derived Products Other (See Comments)    Does not eat for religious reasons - okay with using IV heparin    Patient Measurements: Height: 5\' 6"  (167.6 cm) Weight: 71.1 kg (156 lb 12.8 oz) IBW/kg (Calculated) : 63.8 Heparin Dosing Weight: 70.7 kg  Vital Signs: Temp: 98.3 F (36.8 C) (10/26 0733) Temp Source: Oral (10/26 0733) BP: 106/80 (10/26 0735) Pulse Rate: 76 (10/26 0412)  Labs: Recent Labs    08/22/20 2048 08/23/20 0423  HGB  --  13.4  HCT  --  42.8  PLT  --  269  LABPROT  --  24.9*  INR  --  2.3*  HEPARINUNFRC <0.10* 0.27*  CREATININE  --  1.27*  TROPONINIHS 102* 97*    Estimated Creatinine Clearance: 53 mL/min (A) (by C-G formula based on SCr of 1.27 mg/dL (H)).   Medical History: Past Medical History:  Diagnosis Date  . AICD (automatic cardioverter/defibrillator) present   . Anxiety   . Arthritis    "all over" (09/17/2017)  . Burn   . CAD (coronary artery disease)    a. NSTEMI s/p BMS to 1st Diagonal and distal OM2 in 2007; b. STEMI 03/26/12 s/p BMS to RCA; c. NSTEMI 10/2012 : CTO of LCx (unable to open with PCI) and PL branch, mod dz of LAD and diagonal, and preserved LV systolic fxn, Med Rx;  d.  anterior STEMI in 01/2017 with DES to Proximal LAD  . Cardiomyopathy EF 35% on cath 06/30/14, new from jan 2015 07/20/2014  . Chest pain   . CHF (congestive heart failure) ([place])   . Chronic back pain    "all over" (09/17/2017)  . DDD (degenerative disc disease), cervical   . Depression   . GERD (gastroesophageal reflux disease)   . Headache    "a few/wk" (09/17/2017)  . HTN (hypertension)   . Hypercholesterolemia   . Mental disorder   . Myocardial infarction ([place])    "I've had 7" (09/17/2017)  . Pulmonary edema   . Respiratory failure ([place])   . Sciatic pain   . Sleep apnea   . Stroke [facility])    a. multiple dating back  to 2002; *I''ve had 5; LUE/LLE weaker since" (09/17/2017)  . Tibia fracture (l) leg  . Tobacco abuse   . Type 2 diabetes mellitus ([place])   . Unstable angina ([facility])     Medications:  Infusions:    Assessment: 64 yo male admitted from OSH with chest pain on IV heparin at 840 units/hr. Heparin level is undetectable on this rate.  No overt bleeding or complications noted.  Heparin is being transition to his home coumadin today. Spoke with [facility] about his coumadin dose. He is on Coumadin 5mg  qday except 7.5mg  Thursday/Sunday  Goal of Therapy:  INR 2-3 Monitor platelets by anticoagulation protocol: Yes   Plan:  Coumadin 5mg  qday except 7.5mg  Thu/Sun Daily INR  Onnie Boer, PharmD, BCIDP, AAHIVP, CPP Infectious Disease Pharmacist 08/23/2020 11:05 AM

## 2020-08-23 NOTE — Progress Notes (Signed)
[place] for IV Heparin Indication: chest pain/ACS  Allergies  Allergen Reactions  . Pork-Derived Products Other (See Comments)    Does not eat for religious reasons - okay with using IV heparin    Patient Measurements: Height: 5\' 6"  (167.6 cm) Weight: 71.1 kg (156 lb 12.8 oz) IBW/kg (Calculated) : 63.8 Heparin Dosing Weight: 70.7 kg  Vital Signs: Temp: 98.1 F (36.7 C) (10/26 0412) Temp Source: Oral (10/26 0412) BP: 98/74 (10/26 0412) Pulse Rate: 76 (10/26 0412)  Labs: Recent Labs    08/22/20 2048 08/23/20 0423  HGB  --  13.4  HCT  --  42.8  PLT  --  269  HEPARINUNFRC <0.10* 0.27*  TROPONINIHS 102*  --     CrCl cannot be calculated (Patient's most recent lab result is older than the maximum 21 days allowed.).   Medical History: Past Medical History:  Diagnosis Date  . AICD (automatic cardioverter/defibrillator) present   . Anxiety   . Arthritis    "all over" (09/17/2017)  . Burn   . CAD (coronary artery disease)    a. NSTEMI s/p BMS to 1st Diagonal and distal OM2 in 2007; b. STEMI 03/26/12 s/p BMS to RCA; c. NSTEMI 10/2012 : CTO of LCx (unable to open with PCI) and PL branch, mod dz of LAD and diagonal, and preserved LV systolic fxn, Med Rx;  d.  anterior STEMI in 01/2017 with DES to Proximal LAD  . Cardiomyopathy EF 35% on cath 06/30/14, new from jan 2015 07/20/2014  . Chest pain   . CHF (congestive heart failure) ([place])   . Chronic back pain    "all over" (09/17/2017)  . DDD (degenerative disc disease), cervical   . Depression   . GERD (gastroesophageal reflux disease)   . Headache    "a few/wk" (09/17/2017)  . HTN (hypertension)   . Hypercholesterolemia   . Mental disorder   . Myocardial infarction ([place])    "I've had 7" (09/17/2017)  . Pulmonary edema   . Respiratory failure ([place])   . Sciatic pain   . Sleep apnea   . Stroke [facility])    a. multiple dating back to 2002; *I''ve had 5; LUE/LLE weaker since"  (09/17/2017)  . Tibia fracture (l) leg  . Tobacco abuse   . Type 2 diabetes mellitus ([place])   . Unstable angina (H[facility])     Medications:  Infusions:  . heparin 1,050 Units/hr (08/22/20 2247)    Assessment: 64 yo male admitted from OSH with chest pain on IV heparin at 840 units/hr. Heparin level is undetectable on this rate.  No overt bleeding or complications noted.  10/26 AM update:  Heparin level just below goal No issues per RN  Goal of Therapy:  Heparin level 0.3-0.7 units/ml Monitor platelets by anticoagulation protocol: Yes   Plan:  Increase IV heparin to 1150 units/hr. 1400 heparin level  Narda Bonds, PharmD, BCPS Clinical Pharmacist Phone: [phone]

## 2020-08-24 LAB — GLUCOSE, CAPILLARY: Glucose-Capillary: 100 mg/dL — ABNORMAL HIGH (ref 70–99)

## 2020-08-25 ENCOUNTER — Other Ambulatory Visit (HOSPITAL_COMMUNITY): Payer: Self-pay | Admitting: Cardiology
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
## 2020-09-05 ENCOUNTER — Encounter: Payer: Self-pay | Admitting: Family Medicine

## 2020-09-05 ENCOUNTER — Ambulatory Visit (INDEPENDENT_AMBULATORY_CARE_PROVIDER_SITE_OTHER): Payer: Medicaid Other | Admitting: Family Medicine

## 2020-09-05 VITALS — BP 90/60 | HR 88 | Ht 66.0 in | Wt 155.0 lb

## 2020-09-05 DIAGNOSIS — I255 Ischemic cardiomyopathy: Secondary | ICD-10-CM

## 2020-09-05 DIAGNOSIS — Z95810 Presence of automatic (implantable) cardiac defibrillator: Secondary | ICD-10-CM

## 2020-09-05 DIAGNOSIS — E782 Mixed hyperlipidemia: Secondary | ICD-10-CM

## 2020-09-05 DIAGNOSIS — I5022 Chronic systolic (congestive) heart failure: Secondary | ICD-10-CM | POA: Diagnosis not present

## 2020-09-05 DIAGNOSIS — I2510 Atherosclerotic heart disease of native coronary artery without angina pectoris: Secondary | ICD-10-CM | POA: Diagnosis not present

## 2020-09-05 DIAGNOSIS — I10 Essential (primary) hypertension: Secondary | ICD-10-CM | POA: Diagnosis not present

## 2020-09-05 NOTE — Patient Instructions (Signed)
Medication Instructions:    Your physician recommends that you continue on your current medications as directed. Please refer to the Current Medication list given to you today.  Labwork:  None  Testing/Procedures:  None  Follow-Up:  Your physician recommends that you schedule a follow-up appointment in: 6 months with Dr. Branch.  Any Other Special Instructions Will Be Listed Below (If Applicable).  If you need a refill on your cardiac medications before your next appointment, please call your pharmacy. 

## 2020-09-08 ENCOUNTER — Ambulatory Visit (INDEPENDENT_AMBULATORY_CARE_PROVIDER_SITE_OTHER): Payer: Medicaid Other | Admitting: *Deleted

## 2020-09-08 ENCOUNTER — Other Ambulatory Visit: Payer: Self-pay | Admitting: Internal Medicine

## 2020-09-08 DIAGNOSIS — Z5181 Encounter for therapeutic drug level monitoring: Secondary | ICD-10-CM | POA: Diagnosis not present

## 2020-09-08 DIAGNOSIS — I255 Ischemic cardiomyopathy: Secondary | ICD-10-CM

## 2020-09-08 DIAGNOSIS — I236 Thrombosis of atrium, auricular appendage, and ventricle as current complications following acute myocardial infarction: Secondary | ICD-10-CM | POA: Diagnosis not present

## 2020-09-08 LAB — POCT INR: INR: 3.5 — AB (ref 2.0–3.0)

## 2020-09-08 NOTE — Patient Instructions (Addendum)
Hold warfarin tonight then resume 5mg  daily except 7.5mg  on Sundays and Thursdays. Recheck in 3 wks   *No charge remote INRs, charge [phone] if in person d/t traditional Medicaid*

## 2020-09-09 NOTE — Progress Notes (Signed)
No ICM remote transmission received for 09/05/2020 and next ICM transmission scheduled for 10/03/2020.

## 2020-09-14 MED FILL — Heparin Sod (Porcine) in NaCl IV Soln 25000 Unit/250ML-0.45%: INTRAVENOUS | Qty: 250 | Status: AC

## 2020-10-03 ENCOUNTER — Other Ambulatory Visit (HOSPITAL_COMMUNITY): Payer: Self-pay | Admitting: Cardiology

## 2020-10-07 NOTE — Progress Notes (Signed)
No ICM remote transmission received for 10/03/2020 and next ICM transmission scheduled for 10/31/2020.

## 2020-10-12 ENCOUNTER — Ambulatory Visit (INDEPENDENT_AMBULATORY_CARE_PROVIDER_SITE_OTHER): Payer: Medicaid Other | Admitting: *Deleted

## 2020-10-12 DIAGNOSIS — Z5181 Encounter for therapeutic drug level monitoring: Secondary | ICD-10-CM | POA: Diagnosis not present

## 2020-10-12 DIAGNOSIS — I255 Ischemic cardiomyopathy: Secondary | ICD-10-CM

## 2020-10-12 DIAGNOSIS — I236 Thrombosis of atrium, auricular appendage, and ventricle as current complications following acute myocardial infarction: Secondary | ICD-10-CM | POA: Diagnosis not present

## 2020-10-12 LAB — POCT INR: INR: 2.9 (ref 2.0–3.0)

## 2020-10-12 NOTE — Patient Instructions (Signed)
Continue warfarin 5mg daily except 7.5mg on Sundays and Thursdays. Recheck in 4 wks   *No charge remote INRs, charge [phone] if in person d/t traditional Medicaid* Orders sent to [facility] 

## 2020-10-31 ENCOUNTER — Ambulatory Visit (INDEPENDENT_AMBULATORY_CARE_PROVIDER_SITE_OTHER): Payer: Medicaid Other

## 2020-10-31 DIAGNOSIS — I255 Ischemic cardiomyopathy: Secondary | ICD-10-CM

## 2020-10-31 LAB — CUP PACEART REMOTE DEVICE CHECK
Battery Remaining Longevity: 76 mo
Battery Remaining Percentage: 72 %
Battery Voltage: 2.98 V
Brady Statistic RV Percent Paced: 1 %
Date Time Interrogation Session: 20220103020020
HighPow Impedance: 63 Ohm
HighPow Impedance: 63 Ohm
Implantable Lead Implant Date: 20181120
Implantable Lead Location: 753860
Implantable Pulse Generator Implant Date: 20181120
Lead Channel Impedance Value: 330 Ohm
Lead Channel Pacing Threshold Amplitude: 0.75 V
Lead Channel Pacing Threshold Pulse Width: 0.5 ms
Lead Channel Sensing Intrinsic Amplitude: 10.7 mV
Lead Channel Setting Pacing Amplitude: 2.5 V
Lead Channel Setting Pacing Pulse Width: 0.5 ms
Lead Channel Setting Sensing Sensitivity: 0.5 mV
Pulse Gen Serial Number: 9786940

## 2020-11-01 ENCOUNTER — Ambulatory Visit (INDEPENDENT_AMBULATORY_CARE_PROVIDER_SITE_OTHER): Payer: Medicaid Other

## 2020-11-01 DIAGNOSIS — Z95810 Presence of automatic (implantable) cardiac defibrillator: Secondary | ICD-10-CM | POA: Diagnosis not present

## 2020-11-01 DIAGNOSIS — I5022 Chronic systolic (congestive) heart failure: Secondary | ICD-10-CM

## 2020-11-04 NOTE — Progress Notes (Signed)
EPIC Encounter for ICM Monitoring  Patient Name: Daniel Li is a 65 y.o. male Date: 11/04/2020 Primary Care Physican: [name], NP Primary Cardiologist:Branch/ [facility] Electrophysiologist:Allred 09/05/2020 Office Weight:155lbs  Transmission reviewed.  CorVue thoracic impedancesuggesting normal fluid levels.  Prescribed dosage:  Torsemide20 mgTake one tablet (20 mg) by mouth daily after supper, may also take one extra tablet (20 mg) daily as needed for weight gain > 3 lbs overnight or >5 lbs  Potassium 20 mEq take 1 tablet daily.  Spironolactone 25 mg take 1 tablet daily  Labs: 08/23/2020 Creatinine 1.27, BUN 11. Potassium 4.3, Sodium, GFR >60 07/13/2020 Creatinine 1.28, BUN 11, Potassium 4.3, Sodium 140, GFR 59->60 A complete set of results can be found in Results Review.  Recommendations:No changes  Follow-up plan: ICM clinic phone appointment on2/14/2022.91 day device clinic remote transmission4/01/2021.   EP/Cardiology Office Visits:11/10/2020 with Dr Aundra Dubin  Copy of ICM check sent to Dr.Allred    3 month ICM trend: 10/31/2020.    1 Year ICM trend:       Rosalene Billings, RN 11/04/2020 3:48 PM

## 2020-11-07 ENCOUNTER — Emergency Department (HOSPITAL_COMMUNITY)
Admission: EM | Admit: 2020-11-07 | Discharge: 2020-11-08 | Disposition: A | Discharge status: Home / Self Care | Payer: Medicaid Other | Attending: Emergency Medicine | Admitting: Emergency Medicine

## 2020-11-07 ENCOUNTER — Encounter (HOSPITAL_COMMUNITY): Payer: Self-pay

## 2020-11-07 ENCOUNTER — Emergency Department (HOSPITAL_COMMUNITY): Payer: Medicaid Other

## 2020-11-07 DIAGNOSIS — F17290 Nicotine dependence, other tobacco product, uncomplicated: Secondary | ICD-10-CM | POA: Diagnosis not present

## 2020-11-07 DIAGNOSIS — D6832 Hemorrhagic disorder due to extrinsic circulating anticoagulants: Secondary | ICD-10-CM

## 2020-11-07 DIAGNOSIS — Z79899 Other long term (current) drug therapy: Secondary | ICD-10-CM | POA: Insufficient documentation

## 2020-11-07 DIAGNOSIS — E119 Type 2 diabetes mellitus without complications: Secondary | ICD-10-CM | POA: Diagnosis not present

## 2020-11-07 DIAGNOSIS — Z7901 Long term (current) use of anticoagulants: Secondary | ICD-10-CM | POA: Insufficient documentation

## 2020-11-07 DIAGNOSIS — Z7902 Long term (current) use of antithrombotics/antiplatelets: Secondary | ICD-10-CM | POA: Diagnosis not present

## 2020-11-07 DIAGNOSIS — T45515A Adverse effect of anticoagulants, initial encounter: Secondary | ICD-10-CM

## 2020-11-07 DIAGNOSIS — F17210 Nicotine dependence, cigarettes, uncomplicated: Secondary | ICD-10-CM | POA: Insufficient documentation

## 2020-11-07 DIAGNOSIS — Z7982 Long term (current) use of aspirin: Secondary | ICD-10-CM | POA: Diagnosis not present

## 2020-11-07 DIAGNOSIS — I5042 Chronic combined systolic (congestive) and diastolic (congestive) heart failure: Secondary | ICD-10-CM | POA: Diagnosis not present

## 2020-11-07 DIAGNOSIS — Z7984 Long term (current) use of oral hypoglycemic drugs: Secondary | ICD-10-CM | POA: Insufficient documentation

## 2020-11-07 DIAGNOSIS — M25511 Pain in right shoulder: Secondary | ICD-10-CM | POA: Insufficient documentation

## 2020-11-07 DIAGNOSIS — J189 Pneumonia, unspecified organism: Secondary | ICD-10-CM

## 2020-11-07 DIAGNOSIS — D473 Essential (hemorrhagic) thrombocythemia: Secondary | ICD-10-CM | POA: Insufficient documentation

## 2020-11-07 DIAGNOSIS — R42 Dizziness and giddiness: Secondary | ICD-10-CM | POA: Diagnosis not present

## 2020-11-07 DIAGNOSIS — R079 Chest pain, unspecified: Secondary | ICD-10-CM

## 2020-11-07 DIAGNOSIS — U071 COVID-19: Secondary | ICD-10-CM | POA: Insufficient documentation

## 2020-11-07 DIAGNOSIS — I2510 Atherosclerotic heart disease of native coronary artery without angina pectoris: Secondary | ICD-10-CM | POA: Insufficient documentation

## 2020-11-07 DIAGNOSIS — I110 Hypertensive heart disease with heart failure: Secondary | ICD-10-CM | POA: Insufficient documentation

## 2020-11-07 DIAGNOSIS — R0789 Other chest pain: Secondary | ICD-10-CM | POA: Diagnosis present

## 2020-11-07 DIAGNOSIS — D75839 Thrombocytosis, unspecified: Secondary | ICD-10-CM

## 2020-11-07 DIAGNOSIS — E871 Hypo-osmolality and hyponatremia: Secondary | ICD-10-CM | POA: Diagnosis not present

## 2020-11-07 LAB — BASIC METABOLIC PANEL
Anion gap: 13 (ref 5–15)
BUN: 15 mg/dL (ref 8–23)
CO2: 22 mmol/L (ref 22–32)
Calcium: 9 mg/dL (ref 8.9–10.3)
Chloride: 93 mmol/L — ABNORMAL LOW (ref 98–111)
Creatinine, Ser: 1.25 mg/dL — ABNORMAL HIGH (ref 0.61–1.24)
GFR, Estimated: >60 mL/min (ref >60)
Glucose, Bld: 64 mg/dL — ABNORMAL LOW (ref 70–99)
Potassium: 3.7 mmol/L (ref 3.5–5.1)
Sodium: 128 mmol/L — ABNORMAL LOW (ref 135–145)

## 2020-11-07 LAB — TROPONIN I (HIGH SENSITIVITY)
Troponin I (High Sensitivity): 24 ng/L — ABNORMAL HIGH (ref <18)
Troponin I (High Sensitivity): 27 ng/L — ABNORMAL HIGH (ref <18)

## 2020-11-07 LAB — CBC
HCT: 40.1 % (ref 39.0–52.0)
Hemoglobin: 13.5 g/dL (ref 13.0–17.0)
MCH: 25.9 pg — ABNORMAL LOW (ref 26.0–34.0)
MCHC: 33.7 g/dL (ref 30.0–36.0)
MCV: 77 fL — ABNORMAL LOW (ref 80.0–100.0)
Platelets: 416 10*3/uL — ABNORMAL HIGH (ref 150–400)
RBC: 5.21 MIL/uL (ref 4.22–5.81)
RDW: 15 % (ref 11.5–15.5)
WBC: 6.3 10*3/uL (ref 4.0–10.5)
nRBC: 0 % (ref 0.0–0.2)

## 2020-11-07 LAB — BRAIN NATRIURETIC PEPTIDE: B Natriuretic Peptide: 85.8 pg/mL (ref 0.0–100.0)

## 2020-11-07 MED ORDER — OXYCODONE-ACETAMINOPHEN 5-325 MG PO TABS
1.0000 | ORAL_TABLET | Freq: Once | ORAL | Status: AC
  Administered 2020-11-07: 1 via ORAL
  Filled 2020-11-07: qty 1

## 2020-11-07 MED ORDER — HYDROCORTISONE NA SUCCINATE PF 250 MG IJ SOLR
200.0000 mg | Freq: Once | INTRAMUSCULAR | Status: AC
  Administered 2020-11-07: 200 mg via INTRAVENOUS
  Filled 2020-11-07: qty 200

## 2020-11-07 MED ORDER — DIPHENHYDRAMINE HCL 50 MG/ML IJ SOLN
50.0000 mg | Freq: Once | INTRAMUSCULAR | Status: AC
  Administered 2020-11-08: 50 mg via INTRAVENOUS
  Filled 2020-11-07: qty 1

## 2020-11-07 MED ORDER — DIPHENHYDRAMINE HCL 25 MG PO CAPS: 50.0000 mg | ORAL_CAPSULE | Freq: Once | ORAL | Status: AC

## 2020-11-07 MED ORDER — MORPHINE SULFATE (PF) 4 MG/ML IV SOLN
4.0000 mg | Freq: Once | INTRAVENOUS | Status: DC
Start: 2020-11-07 — End: 2020-11-07

## 2020-11-07 NOTE — Discharge Instructions (Addendum)
Your INR (blood test for warfarin) was 4.5.  That is too high.  Please contact the clinic which is monitoring your warfarin dose to get recommendations on how to adjust the dose.  A test for COVID-19 was sent.  Please check the results on MyChart.  Return if your symptoms are getting worse.

## 2020-11-07 NOTE — ED Triage Notes (Addendum)
Pt BIB [facility] for eval of CP x 1 week. Eval'd at ED last week, sent home. Hx of 8 MI, 4 CVA. Reports R sided CP, states this feels like his last MI. Rec'd 2 SL NTG, 324 ASA. Pt reports no improvement of pain w/ meds
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
## 2020-11-08 ENCOUNTER — Telehealth (HOSPITAL_COMMUNITY): Payer: Self-pay

## 2020-11-08 ENCOUNTER — Emergency Department (HOSPITAL_COMMUNITY): Payer: Medicaid Other

## 2020-11-08 LAB — PROTIME-INR
INR: 4.5 (ref 0.8–1.2)
Prothrombin Time: 41.2 seconds — ABNORMAL HIGH (ref 11.4–15.2)

## 2020-11-08 LAB — SARS CORONAVIRUS 2 (TAT 6-24 HRS): SARS Coronavirus 2: POSITIVE — AB

## 2020-11-08 MED ORDER — AMOXICILLIN 500 MG PO CAPS: 1000.0000 mg | ORAL_CAPSULE | Freq: Two times a day (BID) | ORAL | 0 refills | Status: DC

## 2020-11-08 MED ORDER — AMOXICILLIN 500 MG PO CAPS
1000.0000 mg | ORAL_CAPSULE | Freq: Once | ORAL | Status: AC
  Administered 2020-11-08: 1000 mg via ORAL
  Filled 2020-11-08: qty 2

## 2020-11-08 MED ORDER — IOHEXOL 350 MG/ML SOLN
75.0000 mL | Freq: Once | INTRAVENOUS | Status: AC | PRN
  Administered 2020-11-08: 75 mL via INTRAVENOUS

## 2020-11-08 NOTE — ED Notes (Signed)
PTAR called  

## 2020-11-08 NOTE — ED Provider Notes (Signed)
Care assumed from Dr. Melina Copa, patient with atypical chest pain, troponins mildly elevated but not changing.  He was signed out to me pending INR (patient on warfarin for atrial fibrillation) and CT angiogram of the chest.  CT angiogram shows hazy density in the right lung consistent with possible pneumonia.  Patient does have a productive cough so will be treated with antibiotics.  INR is elevated, will need to have warfarin dose adjusted by his Coumadin clinic.  He is started on amoxicillin for pneumonia, swab sent for COVID testing.  He has a follow-up appointment with his primary care provider later this week, and he is to keep that appointment.  Results for orders placed or performed during the hospital encounter of 54/00/86  Basic metabolic panel  Result Value Ref Range   Sodium 128 (L) 135 - 145 mmol/L   Potassium 3.7 3.5 - 5.1 mmol/L   Chloride 93 (L) 98 - 111 mmol/L   CO2 22 22 - 32 mmol/L   Glucose, Bld 64 (L) 70 - 99 mg/dL   BUN 15 8 - 23 mg/dL   Creatinine, Ser 1.25 (H) 0.61 - 1.24 mg/dL   Calcium 9.0 8.9 - 10.3 mg/dL   GFR, Estimated >60 >60 mL/min   Anion gap 13 5 - 15  CBC  Result Value Ref Range   WBC 6.3 4.0 - 10.5 K/uL   RBC 5.21 4.22 - 5.81 MIL/uL   Hemoglobin 13.5 13.0 - 17.0 g/dL   HCT 40.1 39.0 - 52.0 %   MCV 77.0 (L) 80.0 - 100.0 fL   MCH 25.9 (L) 26.0 - 34.0 pg   MCHC 33.7 30.0 - 36.0 g/dL   RDW 15.0 11.5 - 15.5 %   Platelets 416 (H) 150 - 400 K/uL   nRBC 0.0 0.0 - 0.2 %  Brain natriuretic peptide  Result Value Ref Range   B Natriuretic Peptide 85.8 0.0 - 100.0 pg/mL  Protime-INR  Result Value Ref Range   Prothrombin Time 41.2 (H) 11.4 - 15.2 seconds   INR 4.5 (HH) 0.8 - 1.2  Troponin I (High Sensitivity)  Result Value Ref Range   Troponin I (High Sensitivity) 24 (H) <18 ng/L  Troponin I (High Sensitivity)  Result Value Ref Range   Troponin I (High Sensitivity) 27 (H) <18 ng/L   CT Angio Chest PE W/Cm &/Or Wo Cm  Result Date: 11/08/2020 CLINICAL DATA:   Chest pain, shortness of breath EXAM: CT ANGIOGRAPHY CHEST WITH CONTRAST TECHNIQUE: Multidetector CT imaging of the chest was performed using the standard protocol during bolus administration of intravenous contrast. Multiplanar CT image reconstructions and MIPs were obtained to evaluate the vascular anatomy. CONTRAST:  9mL OMNIPAQUE IOHEXOL 350 MG/ML SOLN COMPARISON:  06/07/2017 FINDINGS: Cardiovascular: No filling defects in the pulmonary arteries to suggest pulmonary emboli. Heart is normal size. Aorta is normal caliber. Coronary artery calcifications. Mediastinum/Nodes: No mediastinal, hilar, or axillary adenopathy. Trachea and esophagus are unremarkable. Thyroid unremarkable. Lungs/Pleura: Patchy peripheral airspace disease most notable within the right upper and lower lobes. No effusions. Upper Abdomen: Imaging into the upper abdomen demonstrates no acute findings. Musculoskeletal: Chest wall soft tissues are unremarkable. No acute bony abnormality. Review of the MIP images confirms the above findings. IMPRESSION: No evidence of pulmonary embolus. Coronary artery disease. Patchy peripheral ground-glass airspace opacities throughout the right lung could reflect early pneumonia. Electronically Signed   By: Rolm Baptise M.D.   On: 11/08/2020 03:52   DG Chest Port 1 View  Result Date: 11/07/2020 CLINICAL DATA:  Chest pain EXAM: PORTABLE CHEST 1 VIEW COMPARISON:  10/29/2020 FINDINGS: Left AICD remains in place, unchanged. Heart is normal size. No confluent opacities or effusions. No acute bony abnormality. IMPRESSION: No active disease. Electronically Signed   By: Rolm Baptise M.D.   On: 11/07/2020 19:54   CUP PACEART REMOTE DEVICE CHECK  Result Date: 10/31/2020 Scheduled remote reviewed. Normal device function.  Next remote 91 days. HB     Delora Fuel, MD 09/47/09 [phone]

## 2020-11-08 NOTE — ED Notes (Signed)
PTAR at bedside 

## 2020-11-08 NOTE — ED Notes (Addendum)
PTAR notified to transport patient back to [facility] , attempted to call report to assisted living multiple times, no one is answering .

## 2020-11-10 ENCOUNTER — Encounter (HOSPITAL_COMMUNITY): Payer: Medicaid Other | Admitting: Cardiology

## 2020-11-14 NOTE — Progress Notes (Signed)
Remote ICD transmission.   

## 2020-12-01 ENCOUNTER — Other Ambulatory Visit: Payer: Self-pay | Admitting: Internal Medicine

## 2020-12-01 NOTE — Telephone Encounter (Signed)
This is a CHF pt 

## 2020-12-05 ENCOUNTER — Ambulatory Visit (INDEPENDENT_AMBULATORY_CARE_PROVIDER_SITE_OTHER): Payer: Medicaid Other | Admitting: *Deleted

## 2020-12-05 DIAGNOSIS — I236 Thrombosis of atrium, auricular appendage, and ventricle as current complications following acute myocardial infarction: Secondary | ICD-10-CM | POA: Diagnosis not present

## 2020-12-05 DIAGNOSIS — Z5181 Encounter for therapeutic drug level monitoring: Secondary | ICD-10-CM | POA: Diagnosis not present

## 2020-12-05 DIAGNOSIS — I255 Ischemic cardiomyopathy: Secondary | ICD-10-CM | POA: Diagnosis not present

## 2020-12-05 LAB — POCT INR: INR: 3 (ref 2.0–3.0)

## 2020-12-05 NOTE — Progress Notes (Signed)
Advanced Heart Failure Clinic Note  PCP: [name], NP Primary Cardiologist: Dr. Harl Bowie HF Cardiology: Dr. Aundra Dubin   HPI: Daniel Li is a 65 y.o. male with long history of CAD s/p multiple ACS episodes, HTN, HLD, tobacco abuse, DM, OSA, CVA and systolic CHF.   He has a history of CAD with NSTEMI in 2007 with BMS placement to his D1 and OM2. Also with history of STEMI in May 2013 at which time he had a BMS placed to his RCA. NSTEMI in January of 2014, cath showed CTO of left circumflex, unable to intervene upon. EF 55-60% in September 2016. Anterior STEMI 4/18, DES to pLAD.   Admitted 8/9 - 06/11/17 with chest pain. Cath as below. No targets for PCI, LAD stent patent. EF remained low at 20-25% as below. Now with St Jude ICD.   Admitted 4/16 - 02/17/2018 with NSTEMI. Underwent LHC again with unchanged anatomy, no intervention. Discharged back to assisted living.   He was admitted again in 10/19 with NSTEMI.  LHC showed totally occluded PLV, 75% PDA stenosis, 80% mid LAD, occluded LCx.  He had DES to mLAD. Repeat echo showed EF 20-25%.   Had RHC/LHC 06/2020 with coronaries unchanged from prior.  Occluded PLV and proximal LCx with collaterals.  Moderate stenoses in D2 and PDA appear unchanged.  No good interventional target, medical management.   He returned 10/21 for HF follow up. Overall feeling fine. Weights stable. Lives at [facility] in [place].   Today he returns for HF follow up. Overall feeling fine. COVID+ in January. Denies increasing SOB, CP, dizziness, edema, or PND/Orthopnea. Appetite ok. No fever or chills. Weight at home ~148 pounds. Taking all medications. Smoking 1/2 cigarette every day or two.  St Jude device interrogation: Impedance above baseline, no VT/VF (personally reviewed).  Labs (8/18): K 3.7, creatinine 1.42 Labs (9/18): digoxin < 0.2 Labs (11/18): K 3.8, creatinine 1.03 Labs (12/18): K 4, creatinine 1.27, digoxin 0.2, hgb 14.2, LDL 51, HDL 49 Labs  (03/31/2018): K 3.8 Creatinine 1.1 dig level 0.3  Labs (8/19): K 3.5, creatinine 0.98, BNP 88, digoxin 0.4 Labs (9/19): K 3.9 Creatinine 1.34, LDL 64, TGs 151 Labs (10/19): K 4.2, creatinine 1.29, hgb 12.3 Labs (3/20): K 4.5, creatinine 1.2 Labs (1/22): K 3.7, creatinine 1.25, hgb 13.5  Review of systems complete and found to be negative unless listed in HPI.    Past Medical History 1. OSA: Supposed to be using CPAP.  2. HTN 3. CAD: Long history.  - NSTEMI 2007 with BMS D1 and OM2. - STEMI 5/13 with BMS RCA - NSTEMI 1/14 with CTO LCx and PLV, unable to open LCx.  - STEMI 4/18 with LHC showing old TO LCx with collaterals, old TO PLV, 70% ostial D1, totally occluded proximal LAD with some collaterals => DES to LAD.  - LHC 8/18 with old TO LCx, old TO PLV, 60% mLAD, patent LAD stent.  - LHC 4/19 with totally occluded PLV, totally occluded LCx, patent LAD and D1 stents, 50% mid LAD.  - NSTEMI 10/19. LHC with totally occluded PLV, totally occluded LCx, patent proximal LAD and D1 stents, 80% mid LAD stenosis => DES to mid LAD.  -LHC 07/21/20 1. Low filling pressures. Low but not markedly low cardiac output.  3. Coronaries unchanged from prior.  Occluded PLV and proximal LCx with collaterals.  Moderate stenoses in D2 and PDA appear unchanged.  No good interventional target, medical management.  4. Depression 5. Type II DM 6. Hyperlipidemia:  History of rhabdomyolysis with statins, he is on Zetia.  7. Chronic systolic CHF: Ischemic cardiomyopathy.  Echo from 9/16 showed improvement in EF to 55-60%.  St Jude ICD.  - Echo (4/18) with EF 20-25%, dyskinetic apex, moderate-severe MR, severe LAE.  - Echo (8/18) with EF 20-25%, moderate to severe MR - Echo (4/19) with EF 20-25%  - Echo (10/19) with EF 20-25%, no LV thrombus.  - Echo 08/04/20 EF 20-25%  - RHC (10/19): mean RA 4, PA 22/7, mean PCWP 7, CI 2.3 8. PAD: - ABIs (4/18) with R ABI of 0.84 suggestive of mild disease.  L ABI of 1.27 (Normal flow  at rest) - ABIs (5/19): abnormal TBI on right.  9. LV thrombus 10. H/o CVA  Current Outpatient Medications  Medication Sig Dispense Refill  . acetaminophen (TYLENOL) 325 MG tablet Take 650 mg by mouth every 6 (six) hours as needed for mild pain, fever or headache.     [name] Kitchen amoxicillin (AMOXIL) 500 MG capsule Take 2 capsules (1,000 mg total) by mouth 2 (two) times daily. 40 capsule 0  . carvedilol (COREG) 3.125 MG tablet Take 2 tablets (6.25 mg total) by mouth 2 (two) times daily with a meal. 60 tablet 6  . clopidogrel (PLAVIX) 75 MG tablet Take 1 tablet (75 mg total) by mouth daily with breakfast. 90 tablet 3  . digoxin (LANOXIN) 0.125 MG tablet TAKE ONE TABLET BY MOUTH EVERY DAY (Patient taking differently: Take 0.125 mg by mouth daily.) 30 tablet 6  . empagliflozin (JARDIANCE) 10 MG TABS tablet Take 10 mg by mouth daily.    [name] Kitchen ezetimibe (ZETIA) 10 MG tablet Take 1 tablet (10 mg total) by mouth daily. 30 tablet 3  . fenofibrate (TRICOR) 145 MG tablet TAKE ONE TABLET BY MOUTH EVERY DAY. 30 tablet 0  . gabapentin (NEURONTIN) 100 MG capsule Take 100 mg by mouth 2 (two) times daily.    [name] Kitchen glipiZIDE (GLUCOTROL) 10 MG tablet Take 10 mg by mouth daily.    . isosorbide mononitrate (IMDUR) 30 MG 24 hr tablet Take 30 mg by mouth daily.    . isosorbide mononitrate (IMDUR) 60 MG 24 hr tablet Take 1 tablet (60 mg total) by mouth daily. 30 tablet 6  . losartan (COZAAR) 25 MG tablet Take 25 mg by mouth at bedtime.    . meclizine (ANTIVERT) 25 MG tablet Take 12.5 mg by mouth 3 (three) times daily as needed for dizziness.    . metFORMIN (GLUCOPHAGE) 500 MG tablet Take 500 mg by mouth 2 (two) times daily with a meal.    . nitroGLYCERIN (NITROSTAT) 0.4 MG SL tablet PLACE ONE (1) TABLET UNDER TONGUE EVERY 5 MINUTES UP TO (3) DOSES AS NEEDED FOR CHEST PAIN. IF NO RELIEF, CONTACT MD. (Patient taking differently: Place 0.4 mg under the tongue every 5 (five) minutes as needed for chest pain.) 25 tablet 3  . potassium  chloride SA (K-DUR,KLOR-CON) 20 MEQ tablet Take 1 tablet (20 mEq total) by mouth daily. 30 tablet 3  . REPATHA SURECLICK 166 MG/ML SOAJ INJECT ONE PREFILLED PEN INTO THE SKIN EVERY 14 DAYS (Patient taking differently: Inject 140 mg into the skin See admin instructions. Every 14 days) 2 mL 0  . spironolactone (ALDACTONE) 25 MG tablet TAKE ONE TABLET BY MOUTH AT BEDTIME. 30 tablet 0  . torsemide (DEMADEX) 20 MG tablet TAKE ONE TABLET BY MOUTH EVERY DAY.TAKE 30 MINUTES AFTER THE SAME MEAL EACH DAY. 30 tablet 0   No current facility-administered medications for  this encounter.    Allergies  Allergen Reactions  . Contrast Media [Iodinated Diagnostic Agents] Other (See Comments)    Does not eat for religious reasons - okay with using IV heparin  . Pork-Derived Products Other (See Comments)    Does not eat for religious reasons - okay with using IV heparin     Social History   Socioeconomic History  . Marital status: Single    Spouse name: Not on file  . Number of children: 0  . Years of education: 9  . Highest education level: 9th grade  Occupational History  . Not on file  Tobacco Use  . Smoking status: Current Some Day Smoker    Packs/day: 0.50    Years: 20.00    Pack years: 10.00    Types: Cigarettes, Cigars    Start date: 07/21/1977    Last attempt to quit: 10/15/2017    Years since quitting: 3.1  . Smokeless tobacco: Never Used  Vaping Use  . Vaping Use: Never used  Substance and Sexual Activity  . Alcohol use: Yes    Comment: 09/17/2017 "nothing since 05/2017"  . Drug use: Not Currently    Comment: previously incarcerated for drug related offense.  [name] Kitchen Sexual activity: Not Currently  Other Topics Concern  . Not on file  Social History Narrative  . Not on file   Social Determinants of Health   Financial Resource Strain: Not on file  Food Insecurity: Not on file  Transportation Needs: Not on file  Physical Activity: Not on file  Stress: Not on file  Social  Connections: Not on file  Intimate Partner Violence: Not on file     Family History  Problem Relation Age of Onset  . Stroke Mother   . Heart attack Mother   . Heart attack Father   . Stroke Sister   . Heart attack Sister   . Heart attack Brother   . Stroke Brother   . Liver disease Neg Hx   . Colon cancer Neg Hx    Vitals:   12/06/20 1124  BP: 109/67  Pulse: 79  SpO2: 96%  Weight: 66.1 kg (145 lb 12.8 oz)   Wt Readings from Last 3 Encounters:  12/06/20 66.1 kg (145 lb 12.8 oz)  11/07/20 71 kg (156 lb 8.4 oz)  09/05/20 70.3 kg (155 lb)   PHYSICAL EXAM: General:  NAD. No resp difficulty HEENT: Normal Neck: Supple. No JVD. Carotids 2+ bilat; no bruits. No lymphadenopathy or thryomegaly appreciated. Cor: PMI nondisplaced. Regular rate & rhythm. No rubs, gallops or murmurs. Lungs: Clear Abdomen: Soft, nontender, nondistended. No hepatosplenomegaly. No bruits or masses. Good bowel sounds. Extremities: No cyanosis, clubbing, rash, edema Neuro: alert & oriented x 3, cranial nerves grossly intact. Moves all 4 extremities w/o difficulty. Affect pleasant.  ASSESSMENT & PLAN: 1. CAD:  Extensive prior history of CAD. He has known total occlusion LCx and PLV.  NSTEMI 10/19 with DES to mid LAD. Repeat LHC- no change in coronary anatomy. Continue medical therapy. - No obstructive disease found, stop Plavix given chronic warfarin use.    - No chest pain.  - No statin given history of rhabdomyolysis on statin. He is now on Repatha and Zetia.  2. Chronic systolic OEU:MPNTIRWE cardiomyopathy, St Jude ICD. Echo in 10/19 with EF 20-25%. - NYHA II. Volume status stable. Does not need diuretics.  - Continue Coreg 6.25 mg bid.  - Continue losartan 25 mg daily for now (He did not tolerate Entresto due  to lightheadedness.) - Continue spironolactone 25 mg daily. BMET today & digoxin level today.  - Continue digoxin 0.125 mg.   - Continue Farxiga 10 mg daily. 3. Diabetes: Continue Farxiga 10  mg daily  4. Smoking: He is back to smoking 3-4 ciggs/week. Encouraged to quit. 5. PAD: No claudication, foot ulcers.  - ABIs ordered, will schedule. 6. LV thrombus:  On coumadin as above. No bleeding problems.   7. 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  - On warfarin for LV thrombus. [facility] in [facility] [place].  Follow up in 3 months.   [place], FNP-BC 12/06/20

## 2020-12-05 NOTE — Patient Instructions (Signed)
Continue warfarin 5mg daily except 7.5mg on Sundays and Thursdays. Recheck in 4 wks   *No charge remote INRs, charge [phone] if in person d/t traditional Medicaid* Orders sent to [facility] 

## 2020-12-06 ENCOUNTER — Ambulatory Visit (HOSPITAL_COMMUNITY)
Admission: RE | Admit: 2020-12-06 | Discharge: 2020-12-06 | Disposition: A | Discharge status: Home / Self Care | Payer: Medicaid Other | Source: Ambulatory Visit | Attending: Family Medicine | Admitting: Family Medicine

## 2020-12-06 ENCOUNTER — Encounter (HOSPITAL_COMMUNITY): Payer: Self-pay

## 2020-12-06 ENCOUNTER — Other Ambulatory Visit: Payer: Self-pay

## 2020-12-06 ENCOUNTER — Telehealth (HOSPITAL_COMMUNITY): Payer: Self-pay | Admitting: Family Medicine

## 2020-12-06 VITALS — BP 109/67 | HR 79 | Wt 145.8 lb

## 2020-12-06 DIAGNOSIS — Z8673 Personal history of transient ischemic attack (TIA), and cerebral infarction without residual deficits: Secondary | ICD-10-CM | POA: Diagnosis not present

## 2020-12-06 DIAGNOSIS — Z955 Presence of coronary angioplasty implant and graft: Secondary | ICD-10-CM | POA: Diagnosis not present

## 2020-12-06 DIAGNOSIS — Z95810 Presence of automatic (implantable) cardiac defibrillator: Secondary | ICD-10-CM | POA: Insufficient documentation

## 2020-12-06 DIAGNOSIS — Z7984 Long term (current) use of oral hypoglycemic drugs: Secondary | ICD-10-CM | POA: Diagnosis not present

## 2020-12-06 DIAGNOSIS — I252 Old myocardial infarction: Secondary | ICD-10-CM | POA: Diagnosis not present

## 2020-12-06 DIAGNOSIS — Z79899 Other long term (current) drug therapy: Secondary | ICD-10-CM | POA: Diagnosis not present

## 2020-12-06 DIAGNOSIS — Z8616 Personal history of COVID-19: Secondary | ICD-10-CM | POA: Diagnosis not present

## 2020-12-06 DIAGNOSIS — I236 Thrombosis of atrium, auricular appendage, and ventricle as current complications following acute myocardial infarction: Secondary | ICD-10-CM

## 2020-12-06 DIAGNOSIS — E119 Type 2 diabetes mellitus without complications: Secondary | ICD-10-CM | POA: Insufficient documentation

## 2020-12-06 DIAGNOSIS — I739 Peripheral vascular disease, unspecified: Secondary | ICD-10-CM | POA: Diagnosis not present

## 2020-12-06 DIAGNOSIS — E785 Hyperlipidemia, unspecified: Secondary | ICD-10-CM | POA: Insufficient documentation

## 2020-12-06 DIAGNOSIS — I110 Hypertensive heart disease with heart failure: Secondary | ICD-10-CM | POA: Diagnosis not present

## 2020-12-06 DIAGNOSIS — Z8249 Family history of ischemic heart disease and other diseases of the circulatory system: Secondary | ICD-10-CM | POA: Insufficient documentation

## 2020-12-06 DIAGNOSIS — F17210 Nicotine dependence, cigarettes, uncomplicated: Secondary | ICD-10-CM | POA: Diagnosis not present

## 2020-12-06 DIAGNOSIS — Z7901 Long term (current) use of anticoagulants: Secondary | ICD-10-CM | POA: Diagnosis not present

## 2020-12-06 DIAGNOSIS — Z720 Tobacco use: Secondary | ICD-10-CM | POA: Diagnosis not present

## 2020-12-06 DIAGNOSIS — Z7902 Long term (current) use of antithrombotics/antiplatelets: Secondary | ICD-10-CM | POA: Insufficient documentation

## 2020-12-06 DIAGNOSIS — I2510 Atherosclerotic heart disease of native coronary artery without angina pectoris: Secondary | ICD-10-CM | POA: Insufficient documentation

## 2020-12-06 DIAGNOSIS — I255 Ischemic cardiomyopathy: Secondary | ICD-10-CM | POA: Insufficient documentation

## 2020-12-06 DIAGNOSIS — I5022 Chronic systolic (congestive) heart failure: Secondary | ICD-10-CM | POA: Diagnosis not present

## 2020-12-06 DIAGNOSIS — E1159 Type 2 diabetes mellitus with other circulatory complications: Secondary | ICD-10-CM | POA: Diagnosis not present

## 2020-12-06 LAB — BASIC METABOLIC PANEL
Anion gap: 13 (ref 5–15)
BUN: 10 mg/dL (ref 8–23)
CO2: 28 mmol/L (ref 22–32)
Calcium: 10 mg/dL (ref 8.9–10.3)
Chloride: 99 mmol/L (ref 98–111)
Creatinine, Ser: 1.28 mg/dL — ABNORMAL HIGH (ref 0.61–1.24)
GFR, Estimated: >60 mL/min (ref >60)
Glucose, Bld: 61 mg/dL — ABNORMAL LOW (ref 70–99)
Potassium: 4.8 mmol/L (ref 3.5–5.1)
Sodium: 140 mmol/L (ref 135–145)

## 2020-12-06 LAB — DIGOXIN LEVEL: Digoxin Level: 0.9 ng/mL (ref 0.8–2.0)

## 2020-12-06 NOTE — Patient Instructions (Signed)
It was great to see you today! No medication changes are needed at this time.  Labs today We will only contact you if something comes back abnormal or we need to make some changes. Otherwise no news is good news!  Your physician recommends that you schedule a follow-up appointment in: 3-4 months with Dr Aundra Dubin  Do the following things EVERYDAY: 1) Weigh yourself in the morning before breakfast. Write it down and keep it in a log. 2) Take your medicines as prescribed 3) Eat low salt foods-Limit salt (sodium) to 2000 mg per day.  4) Stay as active as you can everyday 5) Limit all fluids for the day to less than 2 liters  At the [facility], you and your health needs are our priority. As part of our continuing mission to provide you with exceptional heart care, we have created designated Provider Care Teams. These Care Teams include your primary Cardiologist (physician) and Advanced Practice Providers (APPs- Physician Assistants and Nurse Practitioners) who all work together to provide you with the care you need, when you need it.   You may see any of the following providers on your designated Care Team at your next follow up: [name] Kitchen Dr Glori Bickers . Dr [name] . Darrick Grinder, NP . Lyda Jester, [place] . Audry Riles, PharmD   Please be sure to bring in all your medications bottles to every appointment.    If you have any questions or concerns before your next appointment please send [place] a message through [place] or call our office at [phone].    TO LEAVE A MESSAGE FOR THE NURSE SELECT OPTION 2, PLEASE LEAVE A MESSAGE INCLUDING: . YOUR NAME . DATE OF BIRTH . CALL BACK NUMBER . REASON FOR CALL**this is important as we prioritize the call backs  YOU WILL RECEIVE A CALL BACK THE SAME DAY AS LONG AS YOU CALL BEFORE 4:00 PM

## 2020-12-06 NOTE — Telephone Encounter (Signed)
Please have patient stop Plavix (per Dr. Aundra Dubin)

## 2020-12-06 NOTE — Telephone Encounter (Signed)
Order sent to facility and pharmacy

## 2020-12-23 NOTE — Progress Notes (Signed)
No ICM remote transmission received for 12/12/2020 and next ICM transmission scheduled for 01/04/2021.

## 2020-12-27 ENCOUNTER — Other Ambulatory Visit (HOSPITAL_COMMUNITY): Payer: Self-pay | Admitting: Cardiology

## 2021-01-02 ENCOUNTER — Other Ambulatory Visit: Payer: Self-pay

## 2021-01-02 ENCOUNTER — Ambulatory Visit (INDEPENDENT_AMBULATORY_CARE_PROVIDER_SITE_OTHER): Payer: Medicaid Other | Admitting: *Deleted

## 2021-01-02 DIAGNOSIS — Z5181 Encounter for therapeutic drug level monitoring: Secondary | ICD-10-CM

## 2021-01-02 DIAGNOSIS — I255 Ischemic cardiomyopathy: Secondary | ICD-10-CM | POA: Diagnosis not present

## 2021-01-02 DIAGNOSIS — Z7901 Long term (current) use of anticoagulants: Secondary | ICD-10-CM | POA: Diagnosis not present

## 2021-01-02 DIAGNOSIS — I236 Thrombosis of atrium, auricular appendage, and ventricle as current complications following acute myocardial infarction: Secondary | ICD-10-CM

## 2021-01-02 LAB — POCT INR: INR: 2.3 (ref 2.0–3.0)

## 2021-01-02 NOTE — Patient Instructions (Signed)
Continue warfarin 5mg  daily except 7.5mg  on Sundays and Thursdays. Recheck in 4 wks   *No charge remote INRs, charge [phone] if in person d/t traditional Medicaid* Orders sent to [facility]

## 2021-01-06 NOTE — Progress Notes (Signed)
No ICM remote transmission received for 01/04/2021 and next ICM transmission scheduled for 01/31/2021.

## 2021-01-11 ENCOUNTER — Other Ambulatory Visit (HOSPITAL_COMMUNITY): Payer: Self-pay | Admitting: Cardiology

## 2021-01-11 MED ORDER — DIGOXIN 125 MCG PO TABS: 125.0000 ug | ORAL_TABLET | Freq: Every day | ORAL | 6 refills | Status: DC

## 2021-01-25 ENCOUNTER — Other Ambulatory Visit (HOSPITAL_COMMUNITY): Payer: Self-pay | Admitting: Cardiology

## 2021-01-25 DIAGNOSIS — I213 ST elevation (STEMI) myocardial infarction of unspecified site: Secondary | ICD-10-CM

## 2021-01-25 DIAGNOSIS — I2510 Atherosclerotic heart disease of native coronary artery without angina pectoris: Secondary | ICD-10-CM

## 2021-01-30 ENCOUNTER — Ambulatory Visit (INDEPENDENT_AMBULATORY_CARE_PROVIDER_SITE_OTHER): Payer: Medicaid Other

## 2021-01-30 DIAGNOSIS — I255 Ischemic cardiomyopathy: Secondary | ICD-10-CM

## 2021-01-30 DIAGNOSIS — I5022 Chronic systolic (congestive) heart failure: Secondary | ICD-10-CM

## 2021-01-31 ENCOUNTER — Ambulatory Visit (INDEPENDENT_AMBULATORY_CARE_PROVIDER_SITE_OTHER): Payer: Medicaid Other

## 2021-01-31 DIAGNOSIS — I5022 Chronic systolic (congestive) heart failure: Secondary | ICD-10-CM

## 2021-01-31 DIAGNOSIS — Z95810 Presence of automatic (implantable) cardiac defibrillator: Secondary | ICD-10-CM

## 2021-01-31 LAB — CUP PACEART REMOTE DEVICE CHECK
Battery Remaining Longevity: 73 mo
Battery Remaining Percentage: 70 %
Battery Voltage: 2.98 V
Brady Statistic RV Percent Paced: 1 %
Date Time Interrogation Session: 20220403015024
HighPow Impedance: 64 Ohm
HighPow Impedance: 64 Ohm
Implantable Lead Implant Date: 20181120
Implantable Lead Location: 753860
Implantable Pulse Generator Implant Date: 20181120
Lead Channel Impedance Value: 350 Ohm
Lead Channel Pacing Threshold Amplitude: 0.75 V
Lead Channel Pacing Threshold Pulse Width: 0.5 ms
Lead Channel Sensing Intrinsic Amplitude: 12 mV
Lead Channel Setting Pacing Amplitude: 2.5 V
Lead Channel Setting Pacing Pulse Width: 0.5 ms
Lead Channel Setting Sensing Sensitivity: 0.5 mV
Pulse Gen Serial Number: 9786940

## 2021-02-01 NOTE — Progress Notes (Signed)
EPIC Encounter for ICM Monitoring  Patient Name: Daniel Li is a 65 y.o. male Date: 02/01/2021 Primary Care Physican: [name], NP Primary Cardiologist:[facility] Electrophysiologist:Allred 12/06/2020 OfficeWeight:145lbs  Transmission reviewed.  CorVue thoracic impedancesuggestingnormal fluid levels.  Prescribed dosage:  Torsemide20 mgTake one tablet (20 mg) by mouth daily   Potassium 20 mEq take 1 tablet daily.  Spironolactone 25 mg take 1 tablet daily  Labs: 12/06/2020 Creatinine 1.28, BUN 10, Potassium 4.8, Sodium 140, GFR >60 11/07/2020 Creatinine 1.25, BUN 15, Potassium 3.7, Sodium 128, GFR >60  A complete set of results can be found in Results Review.  Recommendations:No changes  Follow-up plan: ICM clinic phone appointment on6/13/2022 (fluid levels will be checked at [facility]).91 day device clinic remote transmission7/02/2021.   EP/Cardiology Office Visits:03/06/2021 with Dr Harl Bowie.  03/08/2021 with Any Tillery, PA.   5/20//2022 with Dr Aundra Dubin  Copy of ICM check sent to Dr.Allred  3 month ICM trend: 01/30/2021.    1 Year ICM trend:       Rosalene Billings, RN 02/01/2021 11:12 AM

## 2021-02-03 ENCOUNTER — Other Ambulatory Visit (HOSPITAL_COMMUNITY): Payer: Self-pay | Admitting: Cardiology

## 2021-02-08 NOTE — Progress Notes (Signed)
Remote ICD transmission.   

## 2021-02-14 ENCOUNTER — Telehealth: Payer: Self-pay | Admitting: *Deleted

## 2021-02-14 MED ORDER — WARFARIN SODIUM 5 MG PO TABS: ORAL_TABLET | ORAL | 3 refills | Status: DC

## 2021-02-14 NOTE — Telephone Encounter (Signed)
Needing refill on warfarin sent to [facility]

## 2021-03-06 ENCOUNTER — Encounter: Payer: Self-pay | Admitting: *Deleted

## 2021-03-06 ENCOUNTER — Ambulatory Visit (INDEPENDENT_AMBULATORY_CARE_PROVIDER_SITE_OTHER): Payer: Medicaid Other | Admitting: Cardiology

## 2021-03-06 ENCOUNTER — Ambulatory Visit (INDEPENDENT_AMBULATORY_CARE_PROVIDER_SITE_OTHER): Payer: Medicaid Other | Admitting: *Deleted

## 2021-03-06 ENCOUNTER — Encounter: Payer: Self-pay | Admitting: Cardiology

## 2021-03-06 VITALS — BP 100/70 | HR 82 | Ht 66.0 in | Wt 140.6 lb

## 2021-03-06 DIAGNOSIS — Z5181 Encounter for therapeutic drug level monitoring: Secondary | ICD-10-CM | POA: Diagnosis not present

## 2021-03-06 DIAGNOSIS — E782 Mixed hyperlipidemia: Secondary | ICD-10-CM

## 2021-03-06 DIAGNOSIS — I5022 Chronic systolic (congestive) heart failure: Secondary | ICD-10-CM | POA: Diagnosis not present

## 2021-03-06 DIAGNOSIS — I236 Thrombosis of atrium, auricular appendage, and ventricle as current complications following acute myocardial infarction: Secondary | ICD-10-CM | POA: Diagnosis not present

## 2021-03-06 DIAGNOSIS — I255 Ischemic cardiomyopathy: Secondary | ICD-10-CM

## 2021-03-06 DIAGNOSIS — I2510 Atherosclerotic heart disease of native coronary artery without angina pectoris: Secondary | ICD-10-CM

## 2021-03-06 DIAGNOSIS — I10 Essential (primary) hypertension: Secondary | ICD-10-CM

## 2021-03-06 LAB — POCT INR: INR: 3.7 — AB (ref 2.0–3.0)

## 2021-03-06 NOTE — Patient Instructions (Signed)
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
## 2021-03-06 NOTE — Patient Instructions (Signed)
Hold warfarin tonight then resume 5mg  daily except 7.5mg  on Sundays and Thursdays. Recheck in 3 wks   *No charge remote INRs, charge [phone] if in person d/t traditional Medicaid* Orders sent to [facility]

## 2021-03-08 ENCOUNTER — Encounter: Payer: Medicaid Other | Admitting: Student

## 2021-03-17 ENCOUNTER — Encounter (HOSPITAL_COMMUNITY): Payer: Self-pay | Admitting: Cardiology

## 2021-03-17 ENCOUNTER — Other Ambulatory Visit: Payer: Self-pay

## 2021-03-17 ENCOUNTER — Ambulatory Visit (HOSPITAL_COMMUNITY)
Admission: RE | Admit: 2021-03-17 | Discharge: 2021-03-17 | Disposition: A | Discharge status: Home / Self Care | Payer: Medicaid Other | Source: Ambulatory Visit | Attending: Cardiology | Admitting: Cardiology

## 2021-03-17 VITALS — BP 128/80 | HR 81

## 2021-03-17 DIAGNOSIS — E7800 Pure hypercholesterolemia, unspecified: Secondary | ICD-10-CM | POA: Diagnosis not present

## 2021-03-17 DIAGNOSIS — G4733 Obstructive sleep apnea (adult) (pediatric): Secondary | ICD-10-CM | POA: Insufficient documentation

## 2021-03-17 DIAGNOSIS — Z7901 Long term (current) use of anticoagulants: Secondary | ICD-10-CM | POA: Diagnosis not present

## 2021-03-17 DIAGNOSIS — Z8249 Family history of ischemic heart disease and other diseases of the circulatory system: Secondary | ICD-10-CM | POA: Diagnosis not present

## 2021-03-17 DIAGNOSIS — I2510 Atherosclerotic heart disease of native coronary artery without angina pectoris: Secondary | ICD-10-CM | POA: Insufficient documentation

## 2021-03-17 DIAGNOSIS — I110 Hypertensive heart disease with heart failure: Secondary | ICD-10-CM | POA: Insufficient documentation

## 2021-03-17 DIAGNOSIS — E1151 Type 2 diabetes mellitus with diabetic peripheral angiopathy without gangrene: Secondary | ICD-10-CM | POA: Diagnosis not present

## 2021-03-17 DIAGNOSIS — I252 Old myocardial infarction: Secondary | ICD-10-CM | POA: Insufficient documentation

## 2021-03-17 DIAGNOSIS — E119 Type 2 diabetes mellitus without complications: Secondary | ICD-10-CM | POA: Insufficient documentation

## 2021-03-17 DIAGNOSIS — I5022 Chronic systolic (congestive) heart failure: Secondary | ICD-10-CM | POA: Diagnosis present

## 2021-03-17 DIAGNOSIS — E785 Hyperlipidemia, unspecified: Secondary | ICD-10-CM | POA: Insufficient documentation

## 2021-03-17 DIAGNOSIS — Z955 Presence of coronary angioplasty implant and graft: Secondary | ICD-10-CM | POA: Insufficient documentation

## 2021-03-17 DIAGNOSIS — F17210 Nicotine dependence, cigarettes, uncomplicated: Secondary | ICD-10-CM | POA: Diagnosis not present

## 2021-03-17 DIAGNOSIS — Z91041 Radiographic dye allergy status: Secondary | ICD-10-CM | POA: Insufficient documentation

## 2021-03-17 DIAGNOSIS — Z79899 Other long term (current) drug therapy: Secondary | ICD-10-CM | POA: Insufficient documentation

## 2021-03-17 DIAGNOSIS — Z8673 Personal history of transient ischemic attack (TIA), and cerebral infarction without residual deficits: Secondary | ICD-10-CM | POA: Diagnosis not present

## 2021-03-17 DIAGNOSIS — I255 Ischemic cardiomyopathy: Secondary | ICD-10-CM | POA: Insufficient documentation

## 2021-03-17 DIAGNOSIS — Z7984 Long term (current) use of oral hypoglycemic drugs: Secondary | ICD-10-CM | POA: Diagnosis not present

## 2021-03-17 MED ORDER — BIDIL 20-37.5 MG PO TABS: 0.5000 | ORAL_TABLET | Freq: Three times a day (TID) | ORAL | 1 refills | Status: DC

## 2021-03-17 NOTE — Patient Instructions (Addendum)
STOP Isordil  Start Bidil (1/2 tablet) 3 x a day  Labs done today, your results will be available in MyChart, we will contact you for abnormal readings.   Your physician recommends that you schedule a follow-up appointment in: 3 months  Please follow up with our heart failure pharmacist in 1 month for medication titration  If you have any questions or concerns before your next appointment please send [place] a message through [place] or call our office at [phone].    TO LEAVE A MESSAGE FOR THE NURSE SELECT OPTION 2, PLEASE LEAVE A MESSAGE INCLUDING: . YOUR NAME . DATE OF BIRTH . CALL BACK NUMBER . REASON FOR CALL**this is important as we prioritize the call backs  [place] AS LONG AS YOU CALL BEFORE 4:00 PM At the [facility], you and your health needs are our priority. As part of our continuing mission to provide you with exceptional heart care, we have created designated Provider Care Teams. These Care Teams include your primary Cardiologist (physician) and Advanced Practice Providers (APPs- Physician Assistants and Nurse Practitioners) who all work together to provide you with the care you need, when you need it.   You may see any of the following providers on your designated Care Team at your next follow up: [name] Kitchen Dr Glori Bickers . Dr [name] . Dr Vickki Muff . Darrick Grinder, NP . Lyda Jester, [place] . Audry Riles, PharmD   Please be sure to bring in all your medications bottles to every appointment.

## 2021-03-19 NOTE — Progress Notes (Signed)
Advanced Heart Failure Clinic Note  PCP: [name], NP Primary Cardiologist: Dr. Harl Bowie HF Cardiology: Dr. Aundra Dubin   HPI: Daniel Li is a 65 y.o. male with long history of CAD s/p multiple ACS episodes, HTN, HLD, tobacco abuse, DM, OSA, CVA and systolic CHF.   He has a history of CAD with NSTEMI in 2007 with BMS placement to his D1 and OM2. Also with history of STEMI in May 2013 at which time he had a BMS placed to his RCA. NSTEMI in January of 2014, cath showed CTO of left circumflex, unable to intervene upon. EF 55-60% in September 2016. Anterior STEMI 4/18, DES to pLAD.   Admitted 8/9 - 06/11/17 with chest pain. Cath as below. No targets for PCI, LAD stent patent. EF remained low at 20-25% as below. Now with St Jude ICD.   Admitted 4/16 - 02/17/2018 with NSTEMI. Underwent LHC again with unchanged anatomy, no intervention. Discharged back to assisted living.   He was admitted again in 10/19 with NSTEMI.  LHC showed totally occluded PLV, 75% PDA stenosis, 80% mid LAD, occluded LCx.  He had DES to mLAD. Repeat echo showed EF 20-25%.    RHC/LHC in 9/21 showed unchanged coronaries with occluded PLV and proximal LCx with collaterals, patent LAD, 60% D2, 60% PDA. Normal filling pressures and relatively preserved cardiac output.   Echo was done today and reviewed, EF 25% with mild LV dilation, no LV thrombus, normal RV, mild MR.   He returns today for followup of CHF and CAD. He seems to be doing well in general.  No exertional dyspnea. Good exercise tolerance.  Rare atypical chest pain.  No orthopnea/PND.  No palpitations. Weight down 8 lbs.   Still smokes 1 cig every other day.   ECG (personally reviewed): NSR, PVCs, inferolateral TWIs  St Jude device interrogation: Decreased thoracic impedance recently but now back up.   Labs (8/18): K 3.7, creatinine 1.42 Labs (9/18): digoxin < 0.2 Labs (11/18): K 3.8, creatinine 1.03 Labs (12/18): K 4, creatinine 1.27, digoxin 0.2, hgb 14.2,  LDL 51, HDL 49 Labs (03/31/2018): K 3.8 Creatinine 1.1 dig level 0.3  Labs (8/19): K 3.5, creatinine 0.98, BNP 88, digoxin 0.4 Labs (9/19): K 3.9 Creatinine 1.34, LDL 64, TGs 151 Labs (10/19): K 4.2, creatinine 1.29, hgb 12.3 Labs (3/20): K 4.5, creatinine 1.2 Labs (2/22): digoxin 0.9, K 4.8, creatinine 1.28  Review of systems complete and found to be negative unless listed in HPI.    Past Medical History 1. OSA: Supposed to be using CPAP.  2. HTN 3. CAD: Long history.  - NSTEMI 2007 with BMS D1 and OM2. - STEMI 5/13 with BMS RCA - NSTEMI 1/14 with CTO LCx and PLV, unable to open LCx.  - STEMI 4/18 with LHC showing old TO LCx with collaterals, old TO PLV, 70% ostial D1, totally occluded proximal LAD with some collaterals => DES to LAD.  - LHC 8/18 with old TO LCx, old TO PLV, 60% mLAD, patent LAD stent.  - LHC 4/19 with totally occluded PLV, totally occluded LCx, patent LAD and D1 stents, 50% mid LAD.  - NSTEMI 10/19. LHC with totally occluded PLV, totally occluded LCx, patent proximal LAD and D1 stents, 80% mid LAD stenosis => DES to mid LAD.  - LHC (9/21): occluded PLV and proximal LCx with collaterals, patent LAD, 60% D2, 60% PDA.  No intervention.  4. Depression 5. Type II DM 6. Hyperlipidemia: History of rhabdomyolysis with statins, he is on Zetia.  7. Chronic systolic CHF: Ischemic cardiomyopathy.  Echo from 9/16 showed improvement in EF to 55-60%.  St Jude ICD.  - Echo (4/18) with EF 20-25%, dyskinetic apex, moderate-severe MR, severe LAE.  - Echo (8/18) with EF 20-25%, moderate to severe MR - Echo (4/19) with EF 20-25%  - Echo (10/19) with EF 20-25%, no LV thrombus.  - RHC (10/19): mean RA 4, PA 22/7, mean PCWP 7, CI 2.3 - RHC (9/21): mean RA 1, PA 18/5, mean PCWP 7, CI 2.12 (F)/2.17(T) - Echo (5/22): EF 25% with mild LV dilation, no LV thrombus, normal RV, mild MR. 8. PAD: - ABIs (4/18) with R ABI of 0.84 suggestive of mild disease.  L ABI of 1.27 (Normal flow at rest) -  ABIs (5/19): abnormal TBI on right.  - ABIs (10/21): Normal 9. LV thrombus 10. H/o CVA  Current Outpatient Medications  Medication Sig Dispense Refill  . acetaminophen (TYLENOL) 325 MG tablet Take 650 mg by mouth every 6 (six) hours as needed for mild pain, fever or headache.     . carvedilol (COREG) 3.125 MG tablet Take 2 tablets (6.25 mg total) by mouth 2 (two) times daily with a meal. 60 tablet 6  . digoxin (LANOXIN) 0.125 MG tablet Take 1 tablet (125 mcg total) by mouth daily. 30 tablet 6  . empagliflozin (JARDIANCE) 10 MG TABS tablet Take 10 mg by mouth daily.    [name] Kitchen ezetimibe (ZETIA) 10 MG tablet Take 1 tablet (10 mg total) by mouth daily. 30 tablet 3  . fenofibrate (TRICOR) 145 MG tablet TAKE ONE TABLET BY MOUTH EVERY DAY. 30 tablet 0  . gabapentin (NEURONTIN) 100 MG capsule Take 100 mg by mouth 2 (two) times daily.    . isosorbide-hydrALAZINE (BIDIL) 20-37.5 MG tablet Take 0.5 tablets by mouth 3 (three) times daily. 60 tablet 1  . losartan (COZAAR) 25 MG tablet Take 25 mg by mouth at bedtime.    . meclizine (ANTIVERT) 25 MG tablet Take 12.5 mg by mouth 3 (three) times daily as needed for dizziness.    . metFORMIN (GLUCOPHAGE) 500 MG tablet Take 500 mg by mouth 2 (two) times daily with a meal.    . nitroGLYCERIN (NITROSTAT) 0.4 MG SL tablet PLACE ONE (1) TABLET UNDER TONGUE EVERY 5 MINUTES UP TO (3) DOSES AS NEEDED FOR CHEST PAIN. IF NO RELIEF, CONTACT MD. 25 tablet 3  . pantoprazole (PROTONIX) 40 MG tablet Take 40 mg by mouth daily.    . potassium chloride SA (K-DUR,KLOR-CON) 20 MEQ tablet Take 1 tablet (20 mEq total) by mouth daily. 30 tablet 3  . REPATHA SURECLICK 759 MG/ML SOAJ INJECT CONTENTS OF ONE PREFILLED PEN SUBCUTANEOUSLY INTO THE SKIN EVERY 14 DAYS. 2 mL 11  . spironolactone (ALDACTONE) 25 MG tablet TAKE ONE TABLET BY MOUTH AT BEDTIME. 30 tablet 0  . torsemide (DEMADEX) 20 MG tablet TAKE ONE TABLET BY MOUTH EVERY DAY.TAKE 30 MINUTES AFTER THE SAME MEAL EACH DAY. 30 tablet 0   . warfarin (COUMADIN) 5 MG tablet Take 1 tablet daily except 1 1/2 tablets on Sundays and Thursdays or as directed 40 tablet 3  . clopidogrel (PLAVIX) 75 MG tablet Take 1 tablet (75 mg total) by mouth daily with breakfast. 90 tablet 3   No current facility-administered medications for this encounter.    Allergies  Allergen Reactions  . Contrast Media [Iodinated Diagnostic Agents] Other (See Comments)    Does not eat for religious reasons - okay with using IV heparin  . Pork-Derived  Products Other (See Comments)    Does not eat for religious reasons - okay with using IV heparin      Social History   Socioeconomic History  . Marital status: Single    Spouse name: Not on file  . Number of children: 0  . Years of education: 9  . Highest education level: 9th grade  Occupational History  . Not on file  Tobacco Use  . Smoking status: Current Some Day Smoker    Packs/day: 0.50    Years: 20.00    Pack years: 10.00    Types: Cigarettes, Cigars    Start date: 07/21/1977  . Smokeless tobacco: Never Used  Vaping Use  . Vaping Use: Never used  Substance and Sexual Activity  . Alcohol use: Yes    Comment: 09/17/2017 "nothing since 05/2017"  . Drug use: Not Currently    Comment: previously incarcerated for drug related offense.  [name] Kitchen Sexual activity: Not Currently  Other Topics Concern  . Not on file  Social History Narrative  . Not on file   Social Determinants of Health   Financial Resource Strain: Not on file  Food Insecurity: Not on file  Transportation Needs: Not on file  Physical Activity: Not on file  Stress: Not on file  Social Connections: Not on file  Intimate Partner Violence: Not on file      Family History  Problem Relation Age of Onset  . Stroke Mother   . Heart attack Mother   . Heart attack Father   . Stroke Sister   . Heart attack Sister   . Heart attack Brother   . Stroke Brother   . Liver disease Neg Hx   . Colon cancer Neg Hx    Vitals:    03/17/21 1346  BP: 128/80  Pulse: 81  SpO2: 98%   Wt Readings from Last 3 Encounters:  03/06/21 63.8 kg (140 lb 9.6 oz)  12/06/20 66.1 kg (145 lb 12.8 oz)  11/07/20 71 kg (156 lb 8.4 oz)   PHYSICAL EXAM: General: NAD Neck: No JVD, no thyromegaly or thyroid nodule.  Lungs: Clear to auscultation bilaterally with normal respiratory effort. CV: Nondisplaced PMI.  Heart regular S1/S2, no S3/S4, no murmur.  No peripheral edema.  No carotid bruit.  Normal pedal pulses.  Abdomen: Soft, nontender, no hepatosplenomegaly, no distention.  Skin: Intact without lesions or rashes.  Neurologic: Alert and oriented x 3.  Psych: Normal affect. Extremities: No clubbing or cyanosis.  HEENT: Normal.   ASSESSMENT & PLAN:  1. CAD:  Extensive prior history of CAD. He has known total occlusion LCx and PLV.  NSTEMI 10/19 with DES to mid LAD.  Coronary angiography in 9/21 showed stable coronary disease, no intervention was done.  Currently, rare atypical chest pain.  - No ASA given stable coronary disease on warfarin.    - No statin given history of rhabdomyolysis on statin. He is now on Repatha and Zetia. Check lipids.  2. Chronic systolic [id number] cardiomyopathy, St Jude ICD. Echo in 10/19 with EF 20-25%.  Echo today with EF 25%, normla RV.  NYHA class II symptoms, he is not volume overloaded.  - Continue Coreg 6.25 mg bid.  - torsemide 20 mg daily.  BMET today.  - He did not tolerate Entresto due to lightheadedness.  Continue losartan 25 mg daily for now.   - Continue spironolactone 25 mg daily.  - Continue digoxin 0.0625, check level today.   - Continue Jardiance 10 mg daily.  -  Stop isordil, start Bidil 1/2 tab tid.  3. Diabetes: Stop glipizide and start Farxiga 10 mg daily as above.  4. Smoking: Smokes 1 cig qod.  Urged him to quit completely.  5. PAD: No claudication, foot ulcers. ABIs in 10/21 were normal.    6. LV thrombus:  On coumadin as above. No bleeding problems.  No thrombus seen on  today's echo.  7. 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  - On warfarin for LV thrombus. Needs Lovenox bridge if he stops warfarin.   Followup in 3 months with APP.   [name], MD 03/19/21

## 2021-03-20 ENCOUNTER — Other Ambulatory Visit: Payer: Self-pay

## 2021-03-20 ENCOUNTER — Encounter: Payer: Self-pay | Admitting: Student

## 2021-03-20 ENCOUNTER — Ambulatory Visit (INDEPENDENT_AMBULATORY_CARE_PROVIDER_SITE_OTHER): Payer: Medicaid Other | Admitting: Student

## 2021-03-20 VITALS — BP 88/62 | HR 86 | Ht 66.0 in | Wt 139.6 lb

## 2021-03-20 DIAGNOSIS — I10 Essential (primary) hypertension: Secondary | ICD-10-CM

## 2021-03-20 DIAGNOSIS — I5022 Chronic systolic (congestive) heart failure: Secondary | ICD-10-CM

## 2021-03-20 DIAGNOSIS — I255 Ischemic cardiomyopathy: Secondary | ICD-10-CM

## 2021-03-20 DIAGNOSIS — I236 Thrombosis of atrium, auricular appendage, and ventricle as current complications following acute myocardial infarction: Secondary | ICD-10-CM | POA: Diagnosis not present

## 2021-03-20 DIAGNOSIS — I2510 Atherosclerotic heart disease of native coronary artery without angina pectoris: Secondary | ICD-10-CM | POA: Diagnosis not present

## 2021-03-20 LAB — CUP PACEART INCLINIC DEVICE CHECK
Battery Remaining Longevity: 74 mo
Brady Statistic RV Percent Paced: 0.13 %
Date Time Interrogation Session: 20220523104300
HighPow Impedance: 67.5 Ohm
Implantable Lead Implant Date: 20181120
Implantable Lead Location: 753860
Implantable Pulse Generator Implant Date: 20181120
Lead Channel Impedance Value: 337.5 Ohm
Lead Channel Pacing Threshold Amplitude: 0.75 V
Lead Channel Pacing Threshold Pulse Width: 0.5 ms
Lead Channel Sensing Intrinsic Amplitude: 11.6 mV
Lead Channel Setting Pacing Amplitude: 2.5 V
Lead Channel Setting Pacing Pulse Width: 0.5 ms
Lead Channel Setting Sensing Sensitivity: 0.5 mV
Pulse Gen Serial Number: 9786940

## 2021-03-20 MED ORDER — BIDIL 20-37.5 MG PO TABS: 0.5000 | ORAL_TABLET | Freq: Three times a day (TID) | ORAL | 2 refills | Status: DC
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
## 2021-03-20 NOTE — Patient Instructions (Signed)
Medication Instructions:  Your physician has recommended you make the following change in your medication:   STOP: Isosorbide START: Bidil 1/2 tablet three times daily   *If you need a refill on your cardiac medications before your next appointment, please call your pharmacy*   Lab Work: None If you have labs (blood work) drawn today and your tests are completely normal, you will receive your results only by: [name] Kitchen MyChart Message (if you have MyChart) OR . A paper copy in the mail If you have any lab test that is abnormal or we need to change your treatment, we will call you to review the results.   Follow-Up: At [facility], you and your health needs are our priority.  As part of our continuing mission to provide you with exceptional heart care, we have created designated Provider Care Teams.  These Care Teams include your primary Cardiologist (physician) and Advanced Practice Providers (APPs -  Physician Assistants and Nurse Practitioners) who all work together to provide you with the care you need, when you need it.   Your next appointment:   1 year(s)  The format for your next appointment:   In Person  Provider:   You may see Thompson Grayer, MD or one of the following Advanced Practice Providers on your designated Care Team:    Chanetta Marshall, NP  Tommye Standard, PA-C  [name] "[place]" [place], [place]

## 2021-03-21 ENCOUNTER — Encounter (INDEPENDENT_AMBULATORY_CARE_PROVIDER_SITE_OTHER): Payer: Self-pay | Admitting: *Deleted

## 2021-03-29 ENCOUNTER — Ambulatory Visit (INDEPENDENT_AMBULATORY_CARE_PROVIDER_SITE_OTHER): Payer: Medicaid Other | Admitting: *Deleted

## 2021-03-29 DIAGNOSIS — Z5181 Encounter for therapeutic drug level monitoring: Secondary | ICD-10-CM

## 2021-03-29 DIAGNOSIS — I255 Ischemic cardiomyopathy: Secondary | ICD-10-CM

## 2021-03-29 DIAGNOSIS — I236 Thrombosis of atrium, auricular appendage, and ventricle as current complications following acute myocardial infarction: Secondary | ICD-10-CM | POA: Diagnosis not present

## 2021-03-29 LAB — POCT INR: INR: 4.4 — AB (ref 2.0–3.0)

## 2021-03-29 NOTE — Patient Instructions (Signed)
Hold warfarin tonight and tomorrow night then decrease dose to 5mg  daily. Recheck in 3 wks   *No charge remote INRs, charge [phone] if in person d/t traditional Medicaid* Orders sent to [facility]

## 2021-04-11 ENCOUNTER — Other Ambulatory Visit: Payer: Self-pay | Admitting: Cardiology

## 2021-04-11 ENCOUNTER — Other Ambulatory Visit (HOSPITAL_COMMUNITY): Payer: Self-pay | Admitting: Cardiology

## 2021-04-14 NOTE — Progress Notes (Signed)
No ICM remote transmission received for 04/10/2021 and next ICM transmission scheduled for 04/24/2021.

## 2021-04-20 ENCOUNTER — Other Ambulatory Visit: Payer: Self-pay

## 2021-04-20 ENCOUNTER — Ambulatory Visit (INDEPENDENT_AMBULATORY_CARE_PROVIDER_SITE_OTHER): Payer: Medicaid Other | Admitting: *Deleted

## 2021-04-20 DIAGNOSIS — I255 Ischemic cardiomyopathy: Secondary | ICD-10-CM

## 2021-04-20 DIAGNOSIS — I236 Thrombosis of atrium, auricular appendage, and ventricle as current complications following acute myocardial infarction: Secondary | ICD-10-CM

## 2021-04-20 DIAGNOSIS — Z5181 Encounter for therapeutic drug level monitoring: Secondary | ICD-10-CM

## 2021-04-20 LAB — POCT INR: INR: 1.4 — AB (ref 2.0–3.0)

## 2021-04-20 MED ORDER — WARFARIN SODIUM 5 MG PO TABS: ORAL_TABLET | ORAL | 3 refills | Status: DC

## 2021-04-20 NOTE — Patient Instructions (Signed)
Take warfarin 10mg  tonight then increase dose to 5mg  daily except 7.5mg  on Tuesdays and Fridays. Recheck in 2 wks   *No charge remote INRs, charge [phone] if in person d/t traditional Medicaid* Orders sent to [facility]

## 2021-04-24 ENCOUNTER — Encounter (HOSPITAL_COMMUNITY): Payer: Self-pay

## 2021-04-24 ENCOUNTER — Other Ambulatory Visit: Payer: Self-pay

## 2021-04-24 ENCOUNTER — Ambulatory Visit (HOSPITAL_COMMUNITY)
Admission: RE | Admit: 2021-04-24 | Discharge: 2021-04-24 | Disposition: A | Discharge status: Home / Self Care | Payer: Medicaid Other | Source: Ambulatory Visit | Attending: Internal Medicine | Admitting: Internal Medicine

## 2021-04-24 VITALS — BP 110/74 | HR 93 | Wt 140.0 lb

## 2021-04-24 DIAGNOSIS — I2510 Atherosclerotic heart disease of native coronary artery without angina pectoris: Secondary | ICD-10-CM | POA: Insufficient documentation

## 2021-04-24 DIAGNOSIS — F17210 Nicotine dependence, cigarettes, uncomplicated: Secondary | ICD-10-CM | POA: Insufficient documentation

## 2021-04-24 DIAGNOSIS — I739 Peripheral vascular disease, unspecified: Secondary | ICD-10-CM | POA: Diagnosis not present

## 2021-04-24 DIAGNOSIS — Z8673 Personal history of transient ischemic attack (TIA), and cerebral infarction without residual deficits: Secondary | ICD-10-CM | POA: Insufficient documentation

## 2021-04-24 DIAGNOSIS — I5022 Chronic systolic (congestive) heart failure: Secondary | ICD-10-CM | POA: Insufficient documentation

## 2021-04-24 DIAGNOSIS — R0789 Other chest pain: Secondary | ICD-10-CM | POA: Diagnosis not present

## 2021-04-24 DIAGNOSIS — E118 Type 2 diabetes mellitus with unspecified complications: Secondary | ICD-10-CM | POA: Insufficient documentation

## 2021-04-24 DIAGNOSIS — I255 Ischemic cardiomyopathy: Secondary | ICD-10-CM | POA: Diagnosis not present

## 2021-04-24 DIAGNOSIS — Z7901 Long term (current) use of anticoagulants: Secondary | ICD-10-CM | POA: Diagnosis not present

## 2021-04-24 DIAGNOSIS — Z7984 Long term (current) use of oral hypoglycemic drugs: Secondary | ICD-10-CM | POA: Diagnosis not present

## 2021-04-24 DIAGNOSIS — I110 Hypertensive heart disease with heart failure: Secondary | ICD-10-CM | POA: Insufficient documentation

## 2021-04-24 DIAGNOSIS — Z86718 Personal history of other venous thrombosis and embolism: Secondary | ICD-10-CM | POA: Insufficient documentation

## 2021-04-24 DIAGNOSIS — I252 Old myocardial infarction: Secondary | ICD-10-CM | POA: Diagnosis not present

## 2021-04-24 DIAGNOSIS — Z79899 Other long term (current) drug therapy: Secondary | ICD-10-CM | POA: Diagnosis not present

## 2021-04-24 LAB — BASIC METABOLIC PANEL
Anion gap: 8 (ref 5–15)
BUN: 17 mg/dL (ref 8–23)
CO2: 27 mmol/L (ref 22–32)
Calcium: 10 mg/dL (ref 8.9–10.3)
Chloride: 102 mmol/L (ref 98–111)
Creatinine, Ser: 1.22 mg/dL (ref 0.61–1.24)
GFR, Estimated: >60 mL/min (ref >60)
Glucose, Bld: 106 mg/dL — ABNORMAL HIGH (ref 70–99)
Potassium: 4.2 mmol/L (ref 3.5–5.1)
Sodium: 137 mmol/L (ref 135–145)

## 2021-04-24 LAB — DIGOXIN LEVEL: Digoxin Level: 0.6 ng/mL — ABNORMAL LOW (ref 0.8–2.0)

## 2021-04-24 MED ORDER — CARVEDILOL 12.5 MG PO TABS: 12.5000 mg | ORAL_TABLET | Freq: Two times a day (BID) | ORAL | 3 refills | Status: DC

## 2021-04-24 NOTE — Patient Instructions (Signed)
It was a pleasure seeing you today!  MEDICATIONS: -We are changing your medications today -Increase carvedilol to 1 tablet (12.5 mg) by mouth twice daily -Call if you have questions about your medications.  LABS: -We will call you if your labs need attention.  NEXT APPOINTMENT: Return to clinic in 1 month with pharmacist.  In general, to take care of your heart failure: -Limit your fluid intake to 2 Liters (half-gallon) per day.   -Limit your salt intake to ideally 2-3 grams (2000-3000 mg) per day. -Weigh yourself daily and record, and bring that "weight diary" to your next appointment.  (Weight gain of 2-3 pounds in 1 day typically means fluid weight.) -The medications for your heart are to help your heart and help you live longer.   -Please contact us before stopping any of your heart medications.  Call the clinic at [phone] with questions or to reschedule future appointments.

## 2021-04-24 NOTE — Progress Notes (Signed)
PCP:  Mans, NP Primary Cardiologist: Dr. Harl Bowie HF Cardiology: Dr. Aundra Dubin    HPI:  Daniel Li is a 65 y.o. male with long history of CAD s/p multiple ACS episodes, HTN, HLD, tobacco abuse, DM, OSA, CVA and systolic CHF.   He has a history of CAD with NSTEMI in 2007 with BMS placement to his D1 and OM2. Also with history of STEMI in May 2013 at which time he had a BMS placed to his RCA. NSTEMI in January of 2014, cath showed CTO of left circumflex, unable to intervene upon. EF 55-60% in September 2016. Anterior STEMI 4/18, DES to pLAD.   Admitted 8/9 - 06/11/17 with chest pain. Cath as below. No targets for PCI, LAD stent patent. EF remained low at 20-25% as below. Now with St Jude ICD.   Admitted 4/16 - 02/17/2018 with NSTEMI. Underwent LHC again with unchanged anatomy, no intervention. Discharged back to assisted living.   He was admitted again in 10/19 with NSTEMI.  LHC showed totally occluded PLV, 75% PDA stenosis, 80% mid LAD, occluded LCx.  He had DES to mLAD. Repeat echo showed EF 20-25%.    RHC/LHC in 9/21 showed unchanged coronaries with occluded PLV and proximal LCx with collaterals, patent LAD, 60% D2, 60% PDA. Normal filling pressures and relatively preserved cardiac output.   Recent ECHO from 07/2020 EF 25% with mild LV dilation, no LV thrombus, normal RV, mild MR.   Returned to HF clinic for MD visit on 65/20/22. Reported doing well in general, no SOB, CP, orthopnea/PND.   Today he returns to HF clinic for pharmacist medication titration. At last visit with MD, isordil was discontinued and he was started on BiDil 1/2 tab (10/18.75mg ) TID. Overall, he is feeling great. No dizziness, fatigue, chest pain, or palpitations. Breathing is good, no SOB, and exercising frequently (walks). No PND/orthopnea or LEE. Weight at home has been 140-145 lbs consistently. Appetite great. He has been taking all medications as prescribed - nurses at the facility administer meds.  HF  Medications: Carvedilol 6.25 mg BID Losartan 12.5 mg daily Spironolactone 25 mg daily Jardiance 10mg  daily BiDil 0.5 tabs TID Digoxin 0.125 mg daily Torsemide 20 mg daily KCl 20 mEq daily  Has the patient been experiencing any side effects to the medications prescribed?  no  Does the patient have any problems obtaining medications due to transportation or finances?   No - has [place] Medicaid  Understanding of regimen: good Understanding of indications: good Potential of compliance: excellent - medications are provided to him at facility Patient understands to avoid NSAIDs. Patient understands to avoid decongestants.    Pertinent Lab Values: Serum creatinine 1.22, BUN 17, Potassium 4.2, Sodium 137, Digoxin 0.6   Vital Signs: Weight: 140 lbs (last clinic weight: 139 lbs) Blood pressure: 110/74 mmHg Heart rate: 93 bpm  Assessment/Plan: 1. CAD:  Extensive prior history of CAD.  He has known total occlusion LCx and PLV.  NSTEMI 10/19 with DES to mid LAD.  Coronary angiography in 9/21 showed stable coronary disease, no intervention was done.  Currently, rare atypical chest pain. - No ASA given stable coronary disease on warfarin.    - No statin given history of rhabdomyolysis on statin.  He is now on Repatha and Zetia 2. Chronic systolic CHF: Ischemic cardiomyopathy, St Jude ICD. Echo in 07/2018 with EF 20-25%.  Echo from 07/2020 with EF 25%, normal RV. NYHA class II symptoms, he is not volume overloaded today. - Increase carvedilol to 12.5 mg  BID - He did not tolerate Entresto due to lightheadedness. Continue losartan 12.5 mg daily - Continue spironolactone 25 mg daily - Continue Jardiance 10 mg daily - Continue BiDil 1/2 tab (10/18.75 mg) TID - Continue digoxin 0.125 mg daily, digoxin level today was 0.6. - Continue torsemide 20 mg daily. - Continue KCl 20 mEq daily. BMET today - K 4.2. 3. Diabetes: Continue Farxiga 10mg  daily 4. Smoking: Smokes 1 cig qod 5. PAD: No claudication,  foot ulcers. ABIs in 10/21 were normal.  6. 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 No bleeding problems. No thrombus seen on echo 07/2020.  7. 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 - On warfarin for LV thrombus. Needs Lovenox bridge if he stops warfarin.   Follow up with HF PharmD in 1 month and with Dr. Aundra Dubin in 2 months  Marya Fossa Hilliard Clark) [place], PharmD Student  Kerby Nora, PharmD, [facility] Pharmacist [phone]

## 2021-04-28 NOTE — Progress Notes (Signed)
No ICM remote transmission received for 04/24/2021 and next ICM transmission scheduled for 05/29/2021.

## 2021-05-10 ENCOUNTER — Ambulatory Visit (INDEPENDENT_AMBULATORY_CARE_PROVIDER_SITE_OTHER): Payer: Medicaid Other | Admitting: *Deleted

## 2021-05-10 DIAGNOSIS — I255 Ischemic cardiomyopathy: Secondary | ICD-10-CM | POA: Diagnosis not present

## 2021-05-10 DIAGNOSIS — I236 Thrombosis of atrium, auricular appendage, and ventricle as current complications following acute myocardial infarction: Secondary | ICD-10-CM

## 2021-05-10 DIAGNOSIS — Z5181 Encounter for therapeutic drug level monitoring: Secondary | ICD-10-CM

## 2021-05-10 LAB — POCT INR: INR: 3.8 — AB (ref 2.0–3.0)

## 2021-05-10 NOTE — Patient Instructions (Signed)
Decrease warfarin to 5mg  daily except 7.5mg  on Tuesdays. Recheck in 3 wks   *No charge remote INRs, charge [phone] if in person d/t traditional Medicaid* Orders sent to [facility]

## 2021-05-15 ENCOUNTER — Ambulatory Visit (INDEPENDENT_AMBULATORY_CARE_PROVIDER_SITE_OTHER): Payer: Medicaid Other

## 2021-05-15 DIAGNOSIS — I255 Ischemic cardiomyopathy: Secondary | ICD-10-CM

## 2021-05-15 LAB — CUP PACEART REMOTE DEVICE CHECK
Battery Remaining Longevity: 72 mo
Battery Remaining Percentage: 68 %
Battery Voltage: 2.98 V
Brady Statistic RV Percent Paced: 1 %
Date Time Interrogation Session: 20220718101328
HighPow Impedance: 61 Ohm
HighPow Impedance: 61 Ohm
Implantable Lead Implant Date: 20181120
Implantable Lead Location: 753860
Implantable Pulse Generator Implant Date: 20181120
Lead Channel Impedance Value: 330 Ohm
Lead Channel Pacing Threshold Amplitude: 0.75 V
Lead Channel Pacing Threshold Pulse Width: 0.5 ms
Lead Channel Sensing Intrinsic Amplitude: 12 mV
Lead Channel Setting Pacing Amplitude: 2.5 V
Lead Channel Setting Pacing Pulse Width: 0.5 ms
Lead Channel Setting Sensing Sensitivity: 0.5 mV
Pulse Gen Serial Number: 9786940

## 2021-05-25 ENCOUNTER — Ambulatory Visit (HOSPITAL_COMMUNITY)
Admission: RE | Admit: 2021-05-25 | Discharge: 2021-05-25 | Disposition: A | Discharge status: Home / Self Care | Payer: Medicaid Other | Source: Ambulatory Visit | Attending: Cardiology | Admitting: Cardiology

## 2021-05-25 ENCOUNTER — Encounter (HOSPITAL_COMMUNITY): Payer: Self-pay

## 2021-05-25 ENCOUNTER — Other Ambulatory Visit: Payer: Self-pay

## 2021-05-25 VITALS — BP 104/70 | HR 85 | Wt 138.8 lb

## 2021-05-25 DIAGNOSIS — Z8673 Personal history of transient ischemic attack (TIA), and cerebral infarction without residual deficits: Secondary | ICD-10-CM | POA: Insufficient documentation

## 2021-05-25 DIAGNOSIS — Z9861 Coronary angioplasty status: Secondary | ICD-10-CM | POA: Diagnosis not present

## 2021-05-25 DIAGNOSIS — I110 Hypertensive heart disease with heart failure: Secondary | ICD-10-CM | POA: Diagnosis not present

## 2021-05-25 DIAGNOSIS — Z95810 Presence of automatic (implantable) cardiac defibrillator: Secondary | ICD-10-CM | POA: Diagnosis not present

## 2021-05-25 DIAGNOSIS — I252 Old myocardial infarction: Secondary | ICD-10-CM | POA: Insufficient documentation

## 2021-05-25 DIAGNOSIS — I2582 Chronic total occlusion of coronary artery: Secondary | ICD-10-CM | POA: Diagnosis not present

## 2021-05-25 DIAGNOSIS — I513 Intracardiac thrombosis, not elsewhere classified: Secondary | ICD-10-CM | POA: Diagnosis not present

## 2021-05-25 DIAGNOSIS — E785 Hyperlipidemia, unspecified: Secondary | ICD-10-CM | POA: Diagnosis not present

## 2021-05-25 DIAGNOSIS — Z7901 Long term (current) use of anticoagulants: Secondary | ICD-10-CM | POA: Diagnosis not present

## 2021-05-25 DIAGNOSIS — Z79899 Other long term (current) drug therapy: Secondary | ICD-10-CM | POA: Diagnosis not present

## 2021-05-25 DIAGNOSIS — Z7984 Long term (current) use of oral hypoglycemic drugs: Secondary | ICD-10-CM | POA: Diagnosis not present

## 2021-05-25 DIAGNOSIS — E1151 Type 2 diabetes mellitus with diabetic peripheral angiopathy without gangrene: Secondary | ICD-10-CM | POA: Diagnosis not present

## 2021-05-25 DIAGNOSIS — I5022 Chronic systolic (congestive) heart failure: Secondary | ICD-10-CM | POA: Diagnosis not present

## 2021-05-25 DIAGNOSIS — F17210 Nicotine dependence, cigarettes, uncomplicated: Secondary | ICD-10-CM | POA: Diagnosis not present

## 2021-05-25 DIAGNOSIS — G4733 Obstructive sleep apnea (adult) (pediatric): Secondary | ICD-10-CM | POA: Diagnosis not present

## 2021-05-25 DIAGNOSIS — I2510 Atherosclerotic heart disease of native coronary artery without angina pectoris: Secondary | ICD-10-CM | POA: Diagnosis present

## 2021-05-25 MED ORDER — CARVEDILOL 12.5 MG PO TABS: 18.7500 mg | ORAL_TABLET | Freq: Two times a day (BID) | ORAL | 3 refills | Status: DC

## 2021-05-25 NOTE — Progress Notes (Signed)
PCP:  Mans, NP Primary Cardiologist: Dr. Harl Bowie HF Cardiology: Dr. Aundra Dubin  HPI:  Daniel Li is a 65 y.o. male with long history of CAD s/p multiple ACS episodes, HTN, HLD, tobacco abuse, DM, OSA, CVA and systolic CHF.   He has a history of CAD with NSTEMI in 2007 with BMS placement to his D1 and OM2. Also with history of STEMI in May 2013 at which time he had a BMS placed to his RCA. NSTEMI in January of 2014, cath showed CTO of left circumflex, unable to intervene upon. EF 55-60% in September 2016. Anterior STEMI 4/18, DES to pLAD.   Admitted 8/9 - 06/11/17 with chest pain. Cath as below. No targets for PCI, LAD stent patent. EF remained low at 20-25% as below. Now with St Jude ICD.   Admitted 4/16 - 02/17/2018 with NSTEMI. Underwent LHC again with unchanged anatomy, no intervention. Discharged back to assisted living.   He was admitted again in 10/19 with NSTEMI.  LHC showed totally occluded PLV, 75% PDA stenosis, 80% mid LAD, occluded LCx.  He had DES to mLAD. Repeat echo showed EF 20-25%. RHC/LHC in 9/21 showed unchanged coronaries with occluded PLV and proximal LCx with collaterals, patent LAD, 60% D2, 60% PDA. Normal filling pressures and relatively preserved cardiac output.   Recent ECHO from 07/2020 EF 25% with mild LV dilation, no LV thrombus, normal RV, mild MR.   Returned to HF clinic for MD visit on 03/17/21. Reported doing well in general, no SOB, CP, orthopnea/PND. Isordil was discontinued and he was started on BiDil 1/2 tab TID.    On 04/24/21, he returned to HF clinic for pharmacist medication titration. Overall, he felt great. No dizziness, fatigue, chest pain, or palpitations. Breathing was good, no SOB, and exercising frequently (walks). No PND/orthopnea or LEE. Weight at home 140-145 lbs consistently. He had been taking all medications as prescribed - nurses at the facility administer meds.  Today he returns to HF clinic for pharmacist medication titration. At  last visit with PharmD, his carvedilol was increased to 12.5 mg BID. Overall, he is feeling great. Denies any dizziness, lightheadedness, fatigue, chest pain/palpitations, SOB, orthopnea, or PND. Walks 25 mins every morning. Weights stable.  Appetite is good.   HF Medications: Carvedilol 12.5 mg BID Losartan 12.5 mg daily Spironolactone 25 mg daily Jardiance 10 mg daily BiDil 0.5 tabs TID Digoxin 0.125 mg daily Torsemide 20 mg daily KCl 20 mEq daily  Has the patient been experiencing any side effects to the medications prescribed? No  Does the patient have any problems obtaining medications due to transportation or finances? No - has [place] Medicaid  Understanding of regimen: excellent Understanding of indications: excellent Potential of compliance: excellent - medications are provided to him at facility Patient understands to avoid NSAIDs. Patient understands to avoid decongestants.    Pertinent Lab Values: As of 6/27: Serum creatinine 1.22, BUN 17, Potassium 4.2, Sodium 137, Digoxin 0.6   Vital Signs: Weight: 138 lbs (last clinic weight: 140 lbs) Blood pressure: 104/70 mmHg Heart rate: 85 bpm  Assessment/Plan: 1. CAD:  Extensive prior history of CAD.  He has known total occlusion LCx and PLV.  NSTEMI 10/19 with DES to mid LAD.  Coronary angiography in 9/21 showed stable coronary disease, no intervention was done. - No ASA given stable coronary disease on warfarin.    - No statin given history of rhabdomyolysis on statin.  He is now on Repatha and Zetia 2. Chronic systolic CHF: Ischemic cardiomyopathy, St  Jude ICD. Echo in 07/2018 with EF 20-25%.  Echo from 07/2020 with EF 25%, normal RV. NYHA class II symptoms, he is not volume overloaded today. HR mildly improved after increasing carvedilol dose from last visit. Tolerating well. - Increase carvedilol to 18.75 mg (1 1/2 tablets) BID  - Continue losartan 12.5 mg daily - Continue spironolactone 25 mg daily - Continue Jardiance 10  mg daily - Continue BiDil 1/2 tab (10/18.75 mg) TID - Continue digoxin 0.125 mg daily. Last level check on 6/27 was normal 0.6. - Continue torsemide 20 mg daily. - Continue KCl 20 mEq daily. 3. Diabetes: Continue Jardiance '10mg'$  daily 4. Smoking: Smokes 1 cig qod 5. PAD: No claudication, foot ulcers. ABIs in 10/21 were normal.  6. 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 No bleeding problems. No thrombus seen on echo 07/2020.  7. 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 - On warfarin for LV thrombus. Needs Lovenox bridge if he stops warfarin.    Follow up in 1 month with Dr. Aundra Dubin.  Dorian Furnace PharmD/MBA Candidate [facility], [employer] [place], PharmD, BCPS Advanced Heart Failure Clinic Pharmacist Phone [phone]

## 2021-05-25 NOTE — Patient Instructions (Signed)
It was a pleasure seeing you today!  MEDICATIONS: -We are changing your medications today -Increase carvedilol to 1.5 tablets (18.75 mg) TWICE daily -Call if you have questions about your medications.   NEXT APPOINTMENT: Return to clinic in 1 month with Dr. Aundra Dubin.  In general, to take care of your heart failure: -Limit your fluid intake to 2 Liters (half-gallon) per day.   -Limit your salt intake to ideally 2-3 grams (2000-3000 mg) per day. -Weigh yourself daily and record, and bring that "weight diary" to your next appointment.  (Weight gain of 2-3 pounds in 1 day typically means fluid weight.) -The medications for your heart are to help your heart and help you live longer.   -Please contact us before stopping any of your heart medications.  Call the clinic at [phone] with questions or to reschedule future appointments.

## 2021-05-29 ENCOUNTER — Ambulatory Visit (INDEPENDENT_AMBULATORY_CARE_PROVIDER_SITE_OTHER): Payer: Medicaid Other

## 2021-05-29 ENCOUNTER — Telehealth: Payer: Self-pay

## 2021-05-29 DIAGNOSIS — I5022 Chronic systolic (congestive) heart failure: Secondary | ICD-10-CM | POA: Diagnosis not present

## 2021-05-29 DIAGNOSIS — Z95810 Presence of automatic (implantable) cardiac defibrillator: Secondary | ICD-10-CM

## 2021-05-29 NOTE — Telephone Encounter (Signed)
For repatha sent to [facility]

## 2021-05-30 NOTE — Progress Notes (Signed)
EPIC Encounter for ICM Monitoring  Patient Name: Daniel Li is a 65 y.o. male Date: 05/30/2021 Primary Care Physican: [name], NP Primary Cardiologist: [facility] Electrophysiologist: Allred 03/20/2021 Office Weight: 139 lbs                                                  Transmission reviewed.    CorVue thoracic impedance suggesting normal fluid levels.   Prescribed dosage:  Torsemide 20 mg Take one tablet (20 mg) by mouth daily Potassium 20 mEq take 1 tablet daily. Spironolactone 25 mg take 1 tablet daily   Labs: 04/24/2021 Creatinine 1.22, BUN 17, Potassium 4.2, Sodium 137, GFR >60 12/06/2020 Creatinine 1.28, BUN 10, Potassium 4.8, Sodium 140, GFR >60 11/07/2020 Creatinine 1.25, BUN 15, Potassium 3.7, Sodium 128, GFR >60  A complete set of results can be found in Results Review.   Recommendations:  No changes   Follow-up plan: ICM clinic phone appointment on 07/10/2021.    91 day device clinic remote transmission 08/14/2021.     EP/Cardiology Office Visits: 8/24/ with Dr Aundra Dubin   Copy of ICM check sent to Dr. Rayann Heman.   3 month ICM trend: 05/25/2021.    Rosalene Billings, RN 05/30/2021 5:12 PM

## 2021-05-31 ENCOUNTER — Ambulatory Visit (INDEPENDENT_AMBULATORY_CARE_PROVIDER_SITE_OTHER): Payer: Medicaid Other | Admitting: *Deleted

## 2021-05-31 DIAGNOSIS — I255 Ischemic cardiomyopathy: Secondary | ICD-10-CM | POA: Diagnosis not present

## 2021-05-31 DIAGNOSIS — I236 Thrombosis of atrium, auricular appendage, and ventricle as current complications following acute myocardial infarction: Secondary | ICD-10-CM | POA: Diagnosis not present

## 2021-05-31 DIAGNOSIS — Z5181 Encounter for therapeutic drug level monitoring: Secondary | ICD-10-CM

## 2021-05-31 LAB — POCT INR: INR: 3.6 — AB (ref 2.0–3.0)

## 2021-05-31 MED ORDER — WARFARIN SODIUM 5 MG PO TABS: ORAL_TABLET | ORAL | 3 refills | Status: DC

## 2021-05-31 NOTE — Patient Instructions (Signed)
Hold warfarin tonight then decrease dose to '5mg'$  daily except 2.'5mg'$  on Sundays. Recheck in 3 wks   *No charge remote INRs, charge [phone] if in person d/t traditional Medicaid* Orders sent to [facility]

## 2021-06-06 NOTE — Progress Notes (Signed)
Remote ICD transmission.   

## 2021-06-21 ENCOUNTER — Encounter (HOSPITAL_COMMUNITY): Payer: Self-pay | Admitting: Cardiology

## 2021-06-21 ENCOUNTER — Other Ambulatory Visit: Payer: Self-pay

## 2021-06-21 ENCOUNTER — Ambulatory Visit (HOSPITAL_COMMUNITY)
Admission: RE | Admit: 2021-06-21 | Discharge: 2021-06-21 | Disposition: A | Discharge status: Home / Self Care | Payer: Medicaid Other | Source: Ambulatory Visit | Attending: Cardiology | Admitting: Cardiology

## 2021-06-21 VITALS — BP 100/60 | HR 89 | Wt 140.6 lb

## 2021-06-21 DIAGNOSIS — Z8249 Family history of ischemic heart disease and other diseases of the circulatory system: Secondary | ICD-10-CM | POA: Insufficient documentation

## 2021-06-21 DIAGNOSIS — I252 Old myocardial infarction: Secondary | ICD-10-CM | POA: Diagnosis not present

## 2021-06-21 DIAGNOSIS — I2510 Atherosclerotic heart disease of native coronary artery without angina pectoris: Secondary | ICD-10-CM | POA: Diagnosis not present

## 2021-06-21 DIAGNOSIS — Z7901 Long term (current) use of anticoagulants: Secondary | ICD-10-CM | POA: Diagnosis not present

## 2021-06-21 DIAGNOSIS — I110 Hypertensive heart disease with heart failure: Secondary | ICD-10-CM | POA: Diagnosis not present

## 2021-06-21 DIAGNOSIS — I255 Ischemic cardiomyopathy: Secondary | ICD-10-CM | POA: Diagnosis not present

## 2021-06-21 DIAGNOSIS — I5042 Chronic combined systolic (congestive) and diastolic (congestive) heart failure: Secondary | ICD-10-CM

## 2021-06-21 DIAGNOSIS — F17210 Nicotine dependence, cigarettes, uncomplicated: Secondary | ICD-10-CM | POA: Insufficient documentation

## 2021-06-21 DIAGNOSIS — Z79899 Other long term (current) drug therapy: Secondary | ICD-10-CM | POA: Diagnosis not present

## 2021-06-21 DIAGNOSIS — Z7984 Long term (current) use of oral hypoglycemic drugs: Secondary | ICD-10-CM | POA: Diagnosis not present

## 2021-06-21 DIAGNOSIS — I5022 Chronic systolic (congestive) heart failure: Secondary | ICD-10-CM

## 2021-06-21 DIAGNOSIS — Z955 Presence of coronary angioplasty implant and graft: Secondary | ICD-10-CM | POA: Insufficient documentation

## 2021-06-21 DIAGNOSIS — G4733 Obstructive sleep apnea (adult) (pediatric): Secondary | ICD-10-CM | POA: Diagnosis not present

## 2021-06-21 DIAGNOSIS — E119 Type 2 diabetes mellitus without complications: Secondary | ICD-10-CM | POA: Insufficient documentation

## 2021-06-21 DIAGNOSIS — E785 Hyperlipidemia, unspecified: Secondary | ICD-10-CM | POA: Insufficient documentation

## 2021-06-21 DIAGNOSIS — E7800 Pure hypercholesterolemia, unspecified: Secondary | ICD-10-CM

## 2021-06-21 DIAGNOSIS — I236 Thrombosis of atrium, auricular appendage, and ventricle as current complications following acute myocardial infarction: Secondary | ICD-10-CM

## 2021-06-21 LAB — BASIC METABOLIC PANEL
Anion gap: 8 (ref 5–15)
BUN: 17 mg/dL (ref 8–23)
CO2: 28 mmol/L (ref 22–32)
Calcium: 10.3 mg/dL (ref 8.9–10.3)
Chloride: 104 mmol/L (ref 98–111)
Creatinine, Ser: 1.17 mg/dL (ref 0.61–1.24)
GFR, Estimated: >60 mL/min (ref >60)
Glucose, Bld: 120 mg/dL — ABNORMAL HIGH (ref 70–99)
Potassium: 4.2 mmol/L (ref 3.5–5.1)
Sodium: 140 mmol/L (ref 135–145)

## 2021-06-21 LAB — LIPID PANEL
Cholesterol: 103 mg/dL (ref 0–200)
HDL: 50 mg/dL (ref >40)
LDL Cholesterol: 24 mg/dL (ref 0–99)
Total CHOL/HDL Ratio: 2.1 RATIO
Triglycerides: 147 mg/dL (ref <150)
VLDL: 29 mg/dL (ref 0–40)

## 2021-06-21 LAB — BRAIN NATRIURETIC PEPTIDE: B Natriuretic Peptide: 106.9 pg/mL — ABNORMAL HIGH (ref 0.0–100.0)

## 2021-06-21 LAB — DIGOXIN LEVEL: Digoxin Level: 0.8 ng/mL (ref 0.8–2.0)

## 2021-06-21 MED ORDER — LOSARTAN POTASSIUM 25 MG PO TABS: 25.0000 mg | ORAL_TABLET | Freq: Every day | ORAL | 6 refills | Status: DC

## 2021-06-21 NOTE — Patient Instructions (Signed)
Increase Losartan to 25 mg Daily   Labs done today, your results will be available in MyChart, we will contact you for abnormal readings.  Your physician recommends that you return for lab work in: 1-2 weeks, we have given you a prescription to have this done locally  Your physician recommends that you schedule a follow-up appointment in: 3 months  If you have any questions or concerns before your next appointment please send [place] a message through [place] or call our office at [phone].    TO LEAVE A MESSAGE FOR THE NURSE SELECT OPTION 2, PLEASE LEAVE A MESSAGE INCLUDING: YOUR NAME DATE OF BIRTH CALL BACK NUMBER REASON FOR CALL**this is important as we prioritize the call backs  YOU WILL RECEIVE A CALL BACK THE SAME DAY AS LONG AS YOU CALL BEFORE 4:00 PM  At the [facility], you and your health needs are our priority. As part of our continuing mission to provide you with exceptional heart care, we have created designated Provider Care Teams. These Care Teams include your primary Cardiologist (physician) and Advanced Practice Providers (APPs- Physician Assistants and Nurse Practitioners) who all work together to provide you with the care you need, when you need it.   You may see any of the following providers on your designated Care Team at your next follow up: Dr Glori Bickers Dr [name] Dr Patrice Paradise, NP Lyda Jester, [place] Ginnie Smart Audry Riles, PharmD   Please be sure to bring in all your medications bottles to every appointment.

## 2021-06-21 NOTE — Progress Notes (Signed)
Advanced Heart Failure Clinic Note  PCP: [name], NP Primary Cardiologist: Dr. Harl Bowie HF Cardiology: Dr. Aundra Dubin   HPI: Daniel Li is a 65 y.o. male with long history of CAD s/p multiple ACS episodes, HTN, HLD, tobacco abuse, DM, OSA, CVA and systolic CHF.   He has a history of CAD with NSTEMI in 2007 with BMS placement to his D1 and OM2. Also with history of STEMI in May 2013 at which time he had a BMS placed to his RCA. NSTEMI in January of 2014, cath showed CTO of left circumflex, unable to intervene upon. EF 55-60% in September 2016. Anterior STEMI 4/18, DES to pLAD.   Admitted 8/9 - 06/11/17 with chest pain. Cath as below. No targets for PCI, LAD stent patent. EF remained low at 20-25% as below. Now with St Jude ICD.   Admitted 4/16 - 02/17/2018 with NSTEMI. Underwent LHC again with unchanged anatomy, no intervention. Discharged back to assisted living.   He was admitted again in 10/19 with NSTEMI.  LHC showed totally occluded PLV, 75% PDA stenosis, 80% mid LAD, occluded LCx.  He had DES to mLAD. Repeat echo showed EF 20-25%.    RHC/LHC in 9/21 showed unchanged coronaries with occluded PLV and proximal LCx with collaterals, patent LAD, 60% D2, 60% PDA. Normal filling pressures and relatively preserved cardiac output.   Echo in 5/22 showed EF 25% with mild LV dilation, no LV thrombus, normal RV, mild MR.   He returns today for followup of CHF and CAD. Weight is stable.  He walks for exercise, no exertional dyspnea.  No chest pain.  No lightheadedness though blood pressure is relatively low.  No palpitations, he is in NSR today.  No orthopnea/PND. He has quit smoking.   ECG (personally reviewed): NSR, lateral TWIs  Labs (8/18): K 3.7, creatinine 1.42 Labs (9/18): digoxin < 0.2 Labs (11/18): K 3.8, creatinine 1.03 Labs (12/18): K 4, creatinine 1.27, digoxin 0.2, hgb 14.2, LDL 51, HDL 49 Labs (03/31/2018): K 3.8 Creatinine 1.1 dig level 0.3  Labs (8/19): K 3.5, creatinine  0.98, BNP 88, digoxin 0.4 Labs (9/19): K 3.9 Creatinine 1.34, LDL 64, TGs 151 Labs (10/19): K 4.2, creatinine 1.29, hgb 12.3 Labs (3/20): K 4.5, creatinine 1.2 Labs (2/22): digoxin 0.9, K 4.8, creatinine 1.28 Labs (6/22): digoxin 0.6, K 4.2, creatinine 1.22  Review of systems complete and found to be negative unless listed in HPI.    Past Medical History 1. OSA: Supposed to be using CPAP.  2. HTN 3. CAD: Long history.  - NSTEMI 2007 with BMS D1 and OM2. - STEMI 5/13 with BMS RCA - NSTEMI 1/14 with CTO LCx and PLV, unable to open LCx.  - STEMI 4/18 with LHC showing old TO LCx with collaterals, old TO PLV, 70% ostial D1, totally occluded proximal LAD with some collaterals => DES to LAD.  - LHC 8/18 with old TO LCx, old TO PLV, 60% mLAD, patent LAD stent.  - LHC 4/19 with totally occluded PLV, totally occluded LCx, patent LAD and D1 stents, 50% mid LAD.  - NSTEMI 10/19. LHC with totally occluded PLV, totally occluded LCx, patent proximal LAD and D1 stents, 80% mid LAD stenosis => DES to mid LAD.  - LHC (9/21): occluded PLV and proximal LCx with collaterals, patent LAD, 60% D2, 60% PDA.  No intervention.  4. Depression 5. Type II DM 6. Hyperlipidemia: History of rhabdomyolysis with statins, he is on Zetia.  7. Chronic systolic CHF: Ischemic cardiomyopathy.  Echo  from 9/16 showed improvement in EF to 55-60%.  St Jude ICD.  - Echo (4/18) with EF 20-25%, dyskinetic apex, moderate-severe MR, severe LAE.  - Echo (8/18) with EF 20-25%, moderate to severe MR - Echo (4/19) with EF 20-25%  - Echo (10/19) with EF 20-25%, no LV thrombus.  - RHC (10/19): mean RA 4, PA 22/7, mean PCWP 7, CI 2.3 - RHC (9/21): mean RA 1, PA 18/5, mean PCWP 7, CI 2.12 (F)/2.17(T) - Echo (5/22): EF 25% with mild LV dilation, no LV thrombus, normal RV, mild MR. 8. PAD: - ABIs (4/18) with R ABI of 0.84 suggestive of mild disease.  L ABI of 1.27 (Normal flow at rest) - ABIs (5/19): abnormal TBI on right.  - ABIs (10/21):  Normal 9. LV thrombus 10. H/o CVA   Current Outpatient Medications  Medication Sig Dispense Refill   acetaminophen (TYLENOL) 325 MG tablet Take 650 mg by mouth every 6 (six) hours as needed for mild pain, fever or headache.      carvedilol (COREG) 12.5 MG tablet Take 1.5 tablets (18.75 mg total) by mouth 2 (two) times daily. 90 tablet 3   digoxin (LANOXIN) 0.125 MG tablet Take 1 tablet (125 mcg total) by mouth daily. 30 tablet 6   empagliflozin (JARDIANCE) 10 MG TABS tablet Take 10 mg by mouth daily.     ezetimibe (ZETIA) 10 MG tablet Take 1 tablet (10 mg total) by mouth daily. 30 tablet 3   fenofibrate (TRICOR) 145 MG tablet TAKE ONE TABLET BY MOUTH EVERY DAY. 30 tablet 3   gabapentin (NEURONTIN) 100 MG capsule Take 100 mg by mouth 2 (two) times daily.     isosorbide-hydrALAZINE (BIDIL) 20-37.5 MG tablet Take 0.5 tablets by mouth 3 (three) times daily. 70 tablet 2   meclizine (ANTIVERT) 25 MG tablet Take 12.5 mg by mouth 3 (three) times daily as needed for dizziness.     metFORMIN (GLUCOPHAGE) 500 MG tablet Take 1,000 mg by mouth 2 (two) times daily with a meal.     nitroGLYCERIN (NITROSTAT) 0.4 MG SL tablet PLACE ONE (1) TABLET UNDER TONGUE EVERY 5 MINUTES UP TO (3) DOSES AS NEEDED FOR CHEST PAIN. IF NO RELIEF, CONTACT MD. 25 tablet 3   potassium chloride SA (K-DUR,KLOR-CON) 20 MEQ tablet Take 1 tablet (20 mEq total) by mouth daily. 30 tablet 3   REPATHA SURECLICK [id number] MG/ML SOAJ INJECT CONTENTS OF ONE PREFILLED PEN SUBCUTANEOUSLY INTO THE SKIN EVERY 14 DAYS. 2 mL 11   spironolactone (ALDACTONE) 25 MG tablet TAKE ONE TABLET BY MOUTH AT BEDTIME. 30 tablet 3   torsemide (DEMADEX) 20 MG tablet TAKE ONE TABLET BY MOUTH EVERY DAY.TAKE 30 MINUTES AFTER THE SAME MEAL EACH DAY. 30 tablet 0   warfarin (COUMADIN) 5 MG tablet Hold warfarin tonight then decrease dose to '5mg'$  daily except 2.'5mg'$  on Sundays or as directed. 40 tablet 3   losartan (COZAAR) 25 MG tablet Take 1 tablet (25 mg total) by mouth at  bedtime. 30 tablet 6   No current facility-administered medications for this encounter.    Allergies  Allergen Reactions   Contrast Media [Iodinated Diagnostic Agents] Other (See Comments)    Does not eat for religious reasons - okay with using IV heparin   Pork-Derived Products Other (See Comments)    Does not eat for religious reasons - okay with using IV heparin      Social History   Socioeconomic History   Marital status: Single    Spouse name: Not  on file   Number of children: 0   Years of education: 9   Highest education level: 9th grade  Occupational History   Not on file  Tobacco Use   Smoking status: Some Days    Packs/day: 0.50    Years: 20.00    Pack years: 10.00    Types: Cigarettes, Cigars    Start date: 07/21/1977   Smokeless tobacco: Never  Vaping Use   Vaping Use: Never used  Substance and Sexual Activity   Alcohol use: Yes    Comment: 09/17/2017 "nothing since 05/2017"   Drug use: Not Currently    Comment: previously incarcerated for drug related offense.   Sexual activity: Not Currently  Other Topics Concern   Not on file  Social History Narrative   Not on file   Social Determinants of Health   Financial Resource Strain: Not on file  Food Insecurity: Not on file  Transportation Needs: Not on file  Physical Activity: Not on file  Stress: Not on file  Social Connections: Not on file  Intimate Partner Violence: Not on file      Family History  Problem Relation Age of Onset   Stroke Mother    Heart attack Mother    Heart attack Father    Stroke Sister    Heart attack Sister    Heart attack Brother    Stroke Brother    Liver disease Neg Hx    Colon cancer Neg Hx    Vitals:   06/21/21 1041  BP: 100/60  Pulse: 89  SpO2: 96%  Weight: 63.8 kg (140 lb 9.6 oz)   Wt Readings from Last 3 Encounters:  06/21/21 63.8 kg (140 lb 9.6 oz)  05/25/21 63 kg (138 lb 12.8 oz)  04/24/21 63.5 kg (140 lb)   PHYSICAL EXAM: General: NAD Neck: No  JVD, no thyromegaly or thyroid nodule.  Lungs: Clear to auscultation bilaterally with normal respiratory effort. CV: Nondisplaced PMI.  Heart regular S1/S2, no S3/S4, no murmur.  No peripheral edema.  No carotid bruit.  Normal pedal pulses.  Abdomen: Soft, nontender, no hepatosplenomegaly, no distention.  Skin: Intact without lesions or rashes.  Neurologic: Alert and oriented x 3.  Psych: Normal affect. Extremities: No clubbing or cyanosis.  HEENT: Normal.   ASSESSMENT & PLAN:  1. CAD:  Extensive prior history of CAD.  He has known total occlusion LCx and PLV.  NSTEMI 10/19 with DES to mid LAD.  Coronary angiography in 9/21 showed stable coronary disease, no intervention was done.  No recent chest pain.  - No ASA given stable coronary disease on warfarin.    - No statin given history of rhabdomyolysis on statin.  He is now on Repatha and Zetia. Check lipids.  2. Chronic systolic CHF: Ischemic cardiomyopathy, St Jude ICD. Echo in 10/19 with EF 20-25%.  Echo in 5/22 with EF 25%, normal RV.  NYHA class II symptoms, he is not volume overloaded on exam.   - Continue Coreg 18.75 mg bid.  - torsemide 20 mg daily.  BMET today.  - He did not tolerate Entresto due to lightheadedness.  Increase losartan to 25 mg qhs, BMET today and in 10 days.   - Continue spironolactone 25 mg daily.  - Continue digoxin 0.125, check level today.   - Continue Jardiance 10 mg daily.  - Continue Bidil 0.5 tab tid.  3. Diabetes: Taking [place].   4. Smoking: He has quit smoking.  5. PAD: No claudication, foot ulcers. ABIs  in 10/21 were normal.    6. LV thrombus:  On coumadin as above. No bleeding problems.   7. 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  - On warfarin for LV thrombus. Needs Lovenox bridge if he stops warfarin.   Followup in 3 months with APP.   [name], MD 06/21/21

## 2021-06-22 ENCOUNTER — Ambulatory Visit (INDEPENDENT_AMBULATORY_CARE_PROVIDER_SITE_OTHER): Payer: Medicaid Other | Admitting: *Deleted

## 2021-06-22 DIAGNOSIS — I255 Ischemic cardiomyopathy: Secondary | ICD-10-CM | POA: Diagnosis not present

## 2021-06-22 DIAGNOSIS — I236 Thrombosis of atrium, auricular appendage, and ventricle as current complications following acute myocardial infarction: Secondary | ICD-10-CM

## 2021-06-22 DIAGNOSIS — Z5181 Encounter for therapeutic drug level monitoring: Secondary | ICD-10-CM

## 2021-06-22 LAB — POCT INR: INR: 3.1 — AB (ref 2.0–3.0)

## 2021-06-22 MED ORDER — WARFARIN SODIUM 5 MG PO TABS: ORAL_TABLET | ORAL | 0 refills | Status: DC

## 2021-06-22 NOTE — Addendum Note (Signed)
Addended by: Johny Shock B on: 06/22/2021 01:12 PM   Modules accepted: Orders

## 2021-06-22 NOTE — Patient Instructions (Addendum)
Description   Start taking warfarin '5mg'$  daily except for 2.5 mg on Sunday and Thursdays. Recheck INR in 2 weeks.    *No charge remote INRs, charge [phone] if in person d/t traditional Medicaid* [facility] to [facility] where [facility] is a resident and made them aware of pt's dose change. Called and spoke to [facility] made them aware of pt's dose change and sent a new order to them electronically.

## 2021-07-05 ENCOUNTER — Ambulatory Visit (INDEPENDENT_AMBULATORY_CARE_PROVIDER_SITE_OTHER): Payer: Medicare Other | Admitting: *Deleted

## 2021-07-05 DIAGNOSIS — I255 Ischemic cardiomyopathy: Secondary | ICD-10-CM | POA: Diagnosis not present

## 2021-07-05 DIAGNOSIS — I236 Thrombosis of atrium, auricular appendage, and ventricle as current complications following acute myocardial infarction: Secondary | ICD-10-CM | POA: Diagnosis not present

## 2021-07-05 DIAGNOSIS — Z5181 Encounter for therapeutic drug level monitoring: Secondary | ICD-10-CM

## 2021-07-05 LAB — POCT INR: INR: 1.8 — AB (ref 2.0–3.0)

## 2021-07-05 MED ORDER — WARFARIN SODIUM 5 MG PO TABS: ORAL_TABLET | ORAL | 1 refills | Status: DC

## 2021-07-05 NOTE — Patient Instructions (Signed)
Take warfarin 7.'5mg'$  tonight then '5mg'$  daily except for 2.5 mg on Sunday and Thursdays. Recheck INR in 2 weeks.    *No charge remote INRs, charge [phone] if in person d/t traditional Medicaid* New instructions sent to [facility]

## 2021-07-10 ENCOUNTER — Other Ambulatory Visit: Payer: Self-pay

## 2021-07-10 ENCOUNTER — Telehealth: Payer: Self-pay | Admitting: Internal Medicine

## 2021-07-10 ENCOUNTER — Ambulatory Visit (INDEPENDENT_AMBULATORY_CARE_PROVIDER_SITE_OTHER): Payer: Medicare Other

## 2021-07-10 DIAGNOSIS — Z95810 Presence of automatic (implantable) cardiac defibrillator: Secondary | ICD-10-CM

## 2021-07-10 DIAGNOSIS — I5042 Chronic combined systolic (congestive) and diastolic (congestive) heart failure: Secondary | ICD-10-CM

## 2021-07-10 MED ORDER — ISOSORBIDE MONONITRATE ER 30 MG PO TB24: 15.0000 mg | ORAL_TABLET | Freq: Every day | ORAL | 3 refills | Status: DC

## 2021-07-10 MED ORDER — HYDRALAZINE HCL 25 MG PO TABS: 25.0000 mg | ORAL_TABLET | Freq: Three times a day (TID) | ORAL | 3 refills | Status: DC

## 2021-07-10 NOTE — Telephone Encounter (Signed)
Pt c/o medication issue:  1. Name of Medication:  isosorbide-hydrALAZINE  2. How are you currently taking this medication (dosage and times per day)?   3. Are you having a reaction (difficulty breathing--STAT)?   4. What is your medication issue?   Doren Custard with [facility] states their pharmacy is unable to get Bidil in at this time and he would like to confirm it will be fine to distribute isosorbide-hydralazine. Please return call to confirm at [phone].

## 2021-07-11 NOTE — Progress Notes (Signed)
EPIC Encounter for ICM Monitoring  Patient Name: Daniel Li is a 65 y.o. male Date: 07/11/2021 Primary Care Physican: [name], NP Primary Cardiologist: [facility] Electrophysiologist: Allred 03/20/2021 Office Weight: 139 lbs                                                  Transmission reviewed.    CorVue thoracic impedance suggesting normal fluid levels.   Prescribed dosage:  Torsemide 20 mg Take one tablet (20 mg) by mouth daily Potassium 20 mEq take 1 tablet daily. Spironolactone 25 mg take 1 tablet daily   Labs: 06/21/2021 Creatinine 1.17, BUN 17, Potassium 4.2, Sodium 140, GFR >60 04/24/2021 Creatinine 1.22, BUN 17, Potassium 4.2, Sodium 137, GFR >60 12/06/2020 Creatinine 1.28, BUN 10, Potassium 4.8, Sodium 140, GFR >60 11/07/2020 Creatinine 1.25, BUN 15, Potassium 3.7, Sodium 128, GFR >60  A complete set of results can be found in Results Review.   Recommendations:  No changes   Follow-up plan: ICM clinic phone appointment on 08/15/2021.    91 day device clinic remote transmission 08/14/2021.     EP/Cardiology Office Visits: 09/25/2021 with Dr Aundra Dubin.   Copy of ICM check sent to Dr. Rayann Heman.    3 month ICM trend: 07/10/2021.    1 Year ICM trend:       Rosalene Billings, RN 07/11/2021 2:08 PM

## 2021-07-24 ENCOUNTER — Ambulatory Visit (INDEPENDENT_AMBULATORY_CARE_PROVIDER_SITE_OTHER): Payer: Medicare Other | Admitting: *Deleted

## 2021-07-24 DIAGNOSIS — I255 Ischemic cardiomyopathy: Secondary | ICD-10-CM

## 2021-07-24 DIAGNOSIS — Z5181 Encounter for therapeutic drug level monitoring: Secondary | ICD-10-CM | POA: Diagnosis not present

## 2021-07-24 DIAGNOSIS — I236 Thrombosis of atrium, auricular appendage, and ventricle as current complications following acute myocardial infarction: Secondary | ICD-10-CM | POA: Diagnosis not present

## 2021-07-24 LAB — POCT INR: INR: 2 (ref 2.0–3.0)

## 2021-07-24 NOTE — Patient Instructions (Signed)
Continue warfarin 5mg  daily except for 2.5 mg on Sunday and Thursdays. Recheck INR in 2 weeks.    *No charge remote INRs, charge [phone] if in person d/t traditional Medicaid* New instructions sent to [facility]

## 2021-08-04 ENCOUNTER — Other Ambulatory Visit (HOSPITAL_COMMUNITY): Payer: Self-pay | Admitting: Cardiology

## 2021-08-04 ENCOUNTER — Other Ambulatory Visit: Payer: Self-pay | Admitting: Internal Medicine

## 2021-08-15 ENCOUNTER — Ambulatory Visit (INDEPENDENT_AMBULATORY_CARE_PROVIDER_SITE_OTHER): Payer: Medicare Other

## 2021-08-15 DIAGNOSIS — Z95810 Presence of automatic (implantable) cardiac defibrillator: Secondary | ICD-10-CM

## 2021-08-15 DIAGNOSIS — I5042 Chronic combined systolic (congestive) and diastolic (congestive) heart failure: Secondary | ICD-10-CM

## 2021-08-18 NOTE — Progress Notes (Signed)
EPIC Encounter for ICM Monitoring  Patient Name: Daniel Li is a 65 y.o. male Date: 08/18/2021 Primary Care Physican: [name], NP Primary Cardiologist: [facility] Electrophysiologist: Allred 06/21/2021 Office Weight: 140 lbs                                                  Transmission reviewed.    CorVue thoracic impedance suggesting normal fluid levels.   Prescribed dosage:  Torsemide 20 mg Take one tablet (20 mg) by mouth daily Potassium 20 mEq take 1 tablet daily. Spironolactone 25 mg take 1 tablet daily   Labs: 06/21/2021 Creatinine 1.17, BUN 17, Potassium 4.2, Sodium 140, GFR >60 04/24/2021 Creatinine 1.22, BUN 17, Potassium 4.2, Sodium 137, GFR >60 12/06/2020 Creatinine 1.28, BUN 10, Potassium 4.8, Sodium 140, GFR >60 11/07/2020 Creatinine 1.25, BUN 15, Potassium 3.7, Sodium 128, GFR >60  A complete set of results can be found in Results Review.   Recommendations:  No changes   Follow-up plan: ICM clinic phone appointment on 09/25/2021.    91 day device clinic remote transmission 11/13/2021.     EP/Cardiology Office Visits: 09/25/2021 with HF clinic.   Copy of ICM check sent to Dr. Rayann Heman.     3 month ICM trend: 08/15/2021.    1 Year ICM trend:       Rosalene Billings, RN 08/18/2021 2:01 PM

## 2021-08-22 ENCOUNTER — Ambulatory Visit (INDEPENDENT_AMBULATORY_CARE_PROVIDER_SITE_OTHER): Payer: Medicare Other | Admitting: *Deleted

## 2021-08-22 DIAGNOSIS — Z5181 Encounter for therapeutic drug level monitoring: Secondary | ICD-10-CM | POA: Diagnosis not present

## 2021-08-22 DIAGNOSIS — I236 Thrombosis of atrium, auricular appendage, and ventricle as current complications following acute myocardial infarction: Secondary | ICD-10-CM | POA: Diagnosis not present

## 2021-08-22 DIAGNOSIS — I255 Ischemic cardiomyopathy: Secondary | ICD-10-CM

## 2021-08-22 LAB — POCT INR: INR: 1.8 — AB (ref 2.0–3.0)

## 2021-08-22 MED ORDER — WARFARIN SODIUM 5 MG PO TABS: ORAL_TABLET | ORAL | 0 refills | Status: DC

## 2021-08-22 NOTE — Patient Instructions (Signed)
Description   Take 7.5mg  of warfarin today and then start taking warfarin 5mg  daily except for 2.5 mg on Thursdays. Recheck INR in 2 weeks.  *No charge remote INRs, charge [phone] if in person d/t traditional Medicaid* New instructions sent to [facility]

## 2021-09-04 ENCOUNTER — Ambulatory Visit (INDEPENDENT_AMBULATORY_CARE_PROVIDER_SITE_OTHER): Payer: Medicare Other | Admitting: *Deleted

## 2021-09-04 DIAGNOSIS — Z5181 Encounter for therapeutic drug level monitoring: Secondary | ICD-10-CM | POA: Diagnosis not present

## 2021-09-04 DIAGNOSIS — I255 Ischemic cardiomyopathy: Secondary | ICD-10-CM

## 2021-09-04 DIAGNOSIS — I236 Thrombosis of atrium, auricular appendage, and ventricle as current complications following acute myocardial infarction: Secondary | ICD-10-CM

## 2021-09-04 LAB — POCT INR: INR: 1.6 — AB (ref 2.0–3.0)

## 2021-09-04 MED ORDER — WARFARIN SODIUM 5 MG PO TABS: ORAL_TABLET | ORAL | 0 refills | Status: DC

## 2021-09-04 NOTE — Patient Instructions (Signed)
Take 10mg  of warfarin today and then increase dose to 5mg  daily. Recheck INR in 2 weeks.  *No charge remote INRs, charge [phone] if in person d/t traditional Medicaid* New instructions sent to [facility]

## 2021-09-19 NOTE — Progress Notes (Addendum)
Advanced Heart Failure Clinic Note  PCP: [name], NP Primary Cardiologist: Dr. Harl Bowie HF Cardiology: Dr. Aundra Dubin   HPI: Daniel Li is a 65 y.o. male with long history of CAD s/p multiple ACS episodes, HTN, HLD, tobacco abuse, DM, OSA, CVA and systolic CHF.   He has a history of CAD with NSTEMI in 2007 with BMS placement to his D1 and OM2. Also with history of STEMI in May 2013 at which time he had a BMS placed to his RCA. NSTEMI in January of 2014, cath showed CTO of left circumflex, unable to intervene upon. EF 55-60% in September 2016. Anterior STEMI 4/18, DES to pLAD.   Admitted 8/9 - 06/11/17 with chest pain. Cath as below. No targets for PCI, LAD stent patent. EF remained low at 20-25% as below. Now with St Jude ICD.   Admitted 4/16 - 02/17/2018 with NSTEMI. Underwent LHC again with unchanged anatomy, no intervention. Discharged back to assisted living.   He was admitted again in 10/19 with NSTEMI.  LHC showed totally occluded PLV, 75% PDA stenosis, 80% mid LAD, occluded LCx.  He had DES to mLAD. Repeat echo showed EF 20-25%.    RHC/LHC in 9/21 showed unchanged coronaries with occluded PLV and proximal LCx with collaterals, patent LAD, 60% D2, 60% PDA. Normal filling pressures and relatively preserved cardiac output.   Echo in 5/22 showed EF 25% with mild LV dilation, no LV thrombus, normal RV, mild MR.   Today he returns for HF follow up. He lives at [facility] in [place]. He has a [facility] aide who helps with his medications. Has been dizzy for the last 3 days. Says he checks BP at home and runs around 100/90s recently. He does not have dyspnea walking on flat ground,  but does get SOB with stairs. Denies abnormal bleeding, palpitations, CP, edema, or PND/Orthopnea. Appetite ok. No fever or chills. Taking all medications.   ECG (personally reviewed): NSR w/ PVCs, lateral TWI  Device interrogation: CoreVue stable, daily impedence up suggesting normal fluid levels,  no VT (personally reviewed).  Labs (8/18): K 3.7, creatinine 1.42 Labs (9/18): digoxin < 0.2 Labs (11/18): K 3.8, creatinine 1.03 Labs (12/18): K 4, creatinine 1.27, digoxin 0.2, hgb 14.2, LDL 51, HDL 49 Labs (03/31/2018): K 3.8 Creatinine 1.1 dig level 0.3  Labs (8/19): K 3.5, creatinine 0.98, BNP 88, digoxin 0.4 Labs (9/19): K 3.9 Creatinine 1.34, LDL 64, TGs 151 Labs (10/19): K 4.2, creatinine 1.29, hgb 12.3 Labs (3/20): K 4.5, creatinine 1.2 Labs (2/22): digoxin 0.9, K 4.8, creatinine 1.28 Labs (6/22): digoxin 0.6, K 4.2, creatinine 1.22 Labs (8/22): K 4.2, creatinine 1.17  Review of systems complete and found to be negative unless listed in HPI.    Past Medical History 1. OSA: Supposed to be using CPAP.  2. HTN 3. CAD: Long history.  - NSTEMI 2007 with BMS D1 and OM2. - STEMI 5/13 with BMS RCA - NSTEMI 1/14 with CTO LCx and PLV, unable to open LCx.  - STEMI 4/18 with LHC showing old TO LCx with collaterals, old TO PLV, 70% ostial D1, totally occluded proximal LAD with some collaterals => DES to LAD.  - LHC 8/18 with old TO LCx, old TO PLV, 60% mLAD, patent LAD stent.  - LHC 4/19 with totally occluded PLV, totally occluded LCx, patent LAD and D1 stents, 50% mid LAD.  - NSTEMI 10/19. LHC with totally occluded PLV, totally occluded LCx, patent proximal LAD and D1 stents, 80% mid  LAD stenosis => DES to mid LAD.  - LHC (9/21): occluded PLV and proximal LCx with collaterals, patent LAD, 60% D2, 60% PDA.  No intervention.  4. Depression 5. Type II DM 6. Hyperlipidemia: History of rhabdomyolysis with statins, he is on Zetia.  7. Chronic systolic CHF: Ischemic cardiomyopathy.  Echo from 9/16 showed improvement in EF to 55-60%.  St Jude ICD.  - Echo (4/18) with EF 20-25%, dyskinetic apex, moderate-severe MR, severe LAE.  - Echo (8/18) with EF 20-25%, moderate to severe MR - Echo (4/19) with EF 20-25%  - Echo (10/19) with EF 20-25%, no LV thrombus.  - RHC (10/19): mean RA 4, PA 22/7,  mean PCWP 7, CI 2.3 - RHC (9/21): mean RA 1, PA 18/5, mean PCWP 7, CI 2.12 (F)/2.17(T) - Echo (5/22): EF 25% with mild LV dilation, no LV thrombus, normal RV, mild MR. 8. PAD: - ABIs (4/18) with R ABI of 0.84 suggestive of mild disease.  L ABI of 1.27 (Normal flow at rest) - ABIs (5/19): abnormal TBI on right.  - ABIs (10/21): Normal 9. LV thrombus 10. H/o CVA   Current Outpatient Medications  Medication Sig Dispense Refill   acetaminophen (TYLENOL) 325 MG tablet Take 650 mg by mouth every 6 (six) hours as needed for mild pain, fever or headache.      carvedilol (COREG) 12.5 MG tablet TAKE 1.5 TABLETS(18.75MG ) BY MOUTH 2 TIMES A DAY 90 tablet 0   digoxin (LANOXIN) 0.125 MG tablet TAKE 1 TABLET BY MOUTH ONCE DAILY. 90 tablet 0   empagliflozin (JARDIANCE) 10 MG TABS tablet Take 10 mg by mouth daily.     ezetimibe (ZETIA) 10 MG tablet Take 1 tablet (10 mg total) by mouth daily. 30 tablet 3   fenofibrate (TRICOR) 145 MG tablet TAKE ONE TABLET BY MOUTH EVERY DAY. 30 tablet 3   gabapentin (NEURONTIN) 100 MG capsule Take 100 mg by mouth 2 (two) times daily. As needed     hydrALAZINE (APRESOLINE) 25 MG tablet Take 1 tablet (25 mg total) by mouth 3 (three) times daily. 270 tablet 3   isosorbide mononitrate (IMDUR) 30 MG 24 hr tablet Take 0.5 tablets (15 mg total) by mouth daily. 45 tablet 3   losartan (COZAAR) 25 MG tablet Take 1 tablet (25 mg total) by mouth at bedtime. 30 tablet 6   meclizine (ANTIVERT) 25 MG tablet Take 12.5 mg by mouth 3 (three) times daily as needed for dizziness.     metFORMIN (GLUCOPHAGE) 500 MG tablet Take 1,000 mg by mouth 2 (two) times daily with a meal.     nitroGLYCERIN (NITROSTAT) 0.4 MG SL tablet PLACE ONE (1) TABLET UNDER TONGUE EVERY 5 MINUTES UP TO (3) DOSES AS NEEDED FOR CHEST PAIN. IF NO RELIEF, CONTACT MD. 25 tablet 3   potassium chloride SA (K-DUR,KLOR-CON) 20 MEQ tablet Take 1 tablet (20 mEq total) by mouth daily. 30 tablet 3   REPATHA SURECLICK 595 MG/ML SOAJ  INJECT CONTENTS OF ONE PREFILLED PEN SUBCUTANEOUSLY INTO THE SKIN EVERY 14 DAYS. 2 mL 11   spironolactone (ALDACTONE) 25 MG tablet TAKE ONE TABLET BY MOUTH AT BEDTIME. 30 tablet 3   torsemide (DEMADEX) 20 MG tablet TAKE ONE TABLET BY MOUTH EVERY DAY.TAKE 30 MINUTES AFTER THE SAME MEAL EACH DAY. 30 tablet 0   warfarin (COUMADIN) 5 MG tablet Take 7.5mg  of warfarin today (11/23). Then START taking warfarin 5mg  daily (1 tablet) daily except for 1.5 tablets (7.5mg ) on Tuesdays. Recheck INR in 2 weeks on  12/7. 16 tablet 0   No current facility-administered medications for this encounter.   Allergies  Allergen Reactions   Contrast Media [Iodinated Diagnostic Agents] Other (See Comments)    Does not eat for religious reasons - okay with using IV heparin   Pork-Derived Products Other (See Comments)    Does not eat for religious reasons - okay with using IV heparin   Social History   Socioeconomic History   Marital status: Single    Spouse name: Not on file   Number of children: 0   Years of education: 9   Highest education level: 9th grade  Occupational History   Not on file  Tobacco Use   Smoking status: Some Days    Packs/day: 0.50    Years: 20.00    Pack years: 10.00    Types: Cigarettes, Cigars    Start date: 07/21/1977   Smokeless tobacco: Never  Vaping Use   Vaping Use: Never used  Substance and Sexual Activity   Alcohol use: Yes    Comment: 09/17/2017 "nothing since 05/2017"   Drug use: Not Currently    Comment: previously incarcerated for drug related offense.   Sexual activity: Not Currently  Other Topics Concern   Not on file  Social History Narrative   Not on file   Social Determinants of Health   Financial Resource Strain: Not on file  Food Insecurity: Not on file  Transportation Needs: Not on file  Physical Activity: Not on file  Stress: Not on file  Social Connections: Not on file  Intimate Partner Violence: Not on file   Family History  Problem Relation Age  of Onset   Stroke Mother    Heart attack Mother    Heart attack Father    Stroke Sister    Heart attack Sister    Heart attack Brother    Stroke Brother    Liver disease Neg Hx    Colon cancer Neg Hx    BP (!) 78/58 (BP Location: Right Arm, Patient Position: Sitting)   Pulse 76   Wt 63 kg (138 lb 12.8 oz)   SpO2 95%   BMI 22.40 kg/m   Wt Readings from Last 3 Encounters:  09/25/21 63 kg (138 lb 12.8 oz)  06/21/21 63.8 kg (140 lb 9.6 oz)  05/25/21 63 kg (138 lb 12.8 oz)   PHYSICAL EXAM: ReDs 32% General:  NAD. No resp difficulty, weak-appearing. HEENT: Normal Neck: Supple. No JVD. Carotids 2+ bilat; no bruits. No lymphadenopathy or thryomegaly appreciated. Cor: PMI nondisplaced. Regular rate & rhythm. No rubs, gallops or murmurs. Lungs: Clear Abdomen: Soft, nontender, nondistended. No hepatosplenomegaly. No bruits or masses. Good bowel sounds. Extremities: No cyanosis, clubbing, rash, edema Neuro: Alert & oriented x 3, cranial nerves grossly intact. Moves all 4 extremities w/o difficulty. Affect pleasant.  ASSESSMENT & PLAN: 1. CAD:  Extensive prior history of CAD.  He has known total occlusion LCx and PLV.  NSTEMI 10/19 with DES to mid LAD.  Coronary angiography in 9/21 showed stable coronary disease, no intervention was done.  No recent chest pain.  - No ASA given stable coronary disease on warfarin.    - No statin given history of rhabdomyolysis on statin.  He is now on Repatha and Zetia. Lipids ok 8/22. 2. Chronic systolic CHF: Ischemic cardiomyopathy, St Jude ICD. Echo in 10/19 with EF 20-25%.  Echo in 5/22 with EF 25%, normal RV.  He is not overloaded on exam, Reds 32%, NYHA class II symptoms. -  With BP in 78/58, stop hydralazine and Imdur. - Decrease losartan to 12.5 mg q hs (He did not tolerate Entresto due to lightheadedness). - Continue Coreg 18.75 mg bid.  - Continue torsemide 20 mg daily.  BMET today.  - Continue spironolactone 25 mg daily.  - Continue digoxin  0.125, check level today.   - Continue Jardiance 10 mg daily.  3. Diabetes: Taking SGLT2i. 4. Smoking: He has quit smoking.  5. PAD: No claudication, foot ulcers. ABIs in 10/21 were normal.    6. LV thrombus:  On coumadin as above. No bleeding problems.  [facility]. 7. 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  - On warfarin for LV thrombus. Needs Lovenox bridge if he stops warfarin.  8. HTN: Low BP today, historically runs ~ 100/60s. He is not hypovolemic on exam. I think dizziness is likely due to medications, see changes above. - Check CBC.  Followup in 4 weeks for APP for BP check (if he remains dizzy, may need to stop digoxin) and 3 months with Dr. Aundra Dubin.  Harrison, FNP 09/25/21

## 2021-09-20 ENCOUNTER — Ambulatory Visit (INDEPENDENT_AMBULATORY_CARE_PROVIDER_SITE_OTHER): Payer: Medicare Other | Admitting: *Deleted

## 2021-09-20 DIAGNOSIS — Z5181 Encounter for therapeutic drug level monitoring: Secondary | ICD-10-CM

## 2021-09-20 DIAGNOSIS — I236 Thrombosis of atrium, auricular appendage, and ventricle as current complications following acute myocardial infarction: Secondary | ICD-10-CM | POA: Diagnosis not present

## 2021-09-20 DIAGNOSIS — I255 Ischemic cardiomyopathy: Secondary | ICD-10-CM | POA: Diagnosis not present

## 2021-09-20 LAB — POCT INR: INR: 1.8 — AB (ref 2.0–3.0)

## 2021-09-20 MED ORDER — WARFARIN SODIUM 5 MG PO TABS: ORAL_TABLET | ORAL | 0 refills | Status: DC

## 2021-09-20 NOTE — Patient Instructions (Signed)
Description   -TODAY take warfarin 7.5mg  (1.5 tablets) of warfarin  -Then START taking warfarin 5 mg (1 tablet daily) except for 7.5 mg (1.5 tablets on Tuesdays). Recheck INR in 2 weeks.  *No charge remote INRs, charge [phone] if in person d/t traditional Medicaid* New instructions sent to [facility]

## 2021-09-25 ENCOUNTER — Ambulatory Visit (INDEPENDENT_AMBULATORY_CARE_PROVIDER_SITE_OTHER): Payer: Medicare Other

## 2021-09-25 ENCOUNTER — Other Ambulatory Visit: Payer: Self-pay

## 2021-09-25 ENCOUNTER — Ambulatory Visit (HOSPITAL_COMMUNITY)
Admission: RE | Admit: 2021-09-25 | Discharge: 2021-09-25 | Disposition: A | Discharge status: Home / Self Care | Payer: Medicare Other | Source: Ambulatory Visit | Attending: Family Medicine | Admitting: Family Medicine

## 2021-09-25 ENCOUNTER — Encounter (HOSPITAL_COMMUNITY): Payer: Self-pay

## 2021-09-25 VITALS — BP 78/58 | HR 76 | Wt 138.8 lb

## 2021-09-25 DIAGNOSIS — Z79899 Other long term (current) drug therapy: Secondary | ICD-10-CM | POA: Diagnosis not present

## 2021-09-25 DIAGNOSIS — Z7984 Long term (current) use of oral hypoglycemic drugs: Secondary | ICD-10-CM | POA: Insufficient documentation

## 2021-09-25 DIAGNOSIS — Z955 Presence of coronary angioplasty implant and graft: Secondary | ICD-10-CM | POA: Diagnosis not present

## 2021-09-25 DIAGNOSIS — Z7901 Long term (current) use of anticoagulants: Secondary | ICD-10-CM | POA: Insufficient documentation

## 2021-09-25 DIAGNOSIS — Z87891 Personal history of nicotine dependence: Secondary | ICD-10-CM | POA: Insufficient documentation

## 2021-09-25 DIAGNOSIS — E1151 Type 2 diabetes mellitus with diabetic peripheral angiopathy without gangrene: Secondary | ICD-10-CM | POA: Diagnosis not present

## 2021-09-25 DIAGNOSIS — E1159 Type 2 diabetes mellitus with other circulatory complications: Secondary | ICD-10-CM

## 2021-09-25 DIAGNOSIS — I252 Old myocardial infarction: Secondary | ICD-10-CM | POA: Insufficient documentation

## 2021-09-25 DIAGNOSIS — I739 Peripheral vascular disease, unspecified: Secondary | ICD-10-CM

## 2021-09-25 DIAGNOSIS — I2510 Atherosclerotic heart disease of native coronary artery without angina pectoris: Secondary | ICD-10-CM

## 2021-09-25 DIAGNOSIS — Z8673 Personal history of transient ischemic attack (TIA), and cerebral infarction without residual deficits: Secondary | ICD-10-CM

## 2021-09-25 DIAGNOSIS — I236 Thrombosis of atrium, auricular appendage, and ventricle as current complications following acute myocardial infarction: Secondary | ICD-10-CM

## 2021-09-25 DIAGNOSIS — I5022 Chronic systolic (congestive) heart failure: Secondary | ICD-10-CM

## 2021-09-25 DIAGNOSIS — I110 Hypertensive heart disease with heart failure: Secondary | ICD-10-CM | POA: Diagnosis not present

## 2021-09-25 DIAGNOSIS — G4733 Obstructive sleep apnea (adult) (pediatric): Secondary | ICD-10-CM | POA: Insufficient documentation

## 2021-09-25 DIAGNOSIS — Z95810 Presence of automatic (implantable) cardiac defibrillator: Secondary | ICD-10-CM

## 2021-09-25 DIAGNOSIS — E785 Hyperlipidemia, unspecified: Secondary | ICD-10-CM | POA: Diagnosis not present

## 2021-09-25 DIAGNOSIS — R42 Dizziness and giddiness: Secondary | ICD-10-CM

## 2021-09-25 DIAGNOSIS — Z720 Tobacco use: Secondary | ICD-10-CM

## 2021-09-25 LAB — CBC
HCT: 43.6 % (ref 39.0–52.0)
Hemoglobin: 14.2 g/dL (ref 13.0–17.0)
MCH: 26.2 pg (ref 26.0–34.0)
MCHC: 32.6 g/dL (ref 30.0–36.0)
MCV: 80.6 fL (ref 80.0–100.0)
Platelets: 221 10*3/uL (ref 150–400)
RBC: 5.41 MIL/uL (ref 4.22–5.81)
RDW: 16.1 % — ABNORMAL HIGH (ref 11.5–15.5)
WBC: 5.3 10*3/uL (ref 4.0–10.5)
nRBC: 0 % (ref 0.0–0.2)

## 2021-09-25 LAB — BASIC METABOLIC PANEL
Anion gap: 11 (ref 5–15)
BUN: 21 mg/dL (ref 8–23)
CO2: 25 mmol/L (ref 22–32)
Calcium: 9.6 mg/dL (ref 8.9–10.3)
Chloride: 96 mmol/L — ABNORMAL LOW (ref 98–111)
Creatinine, Ser: 1.42 mg/dL — ABNORMAL HIGH (ref 0.61–1.24)
GFR, Estimated: 55 mL/min — ABNORMAL LOW (ref >60)
Glucose, Bld: 119 mg/dL — ABNORMAL HIGH (ref 70–99)
Potassium: 3.7 mmol/L (ref 3.5–5.1)
Sodium: 132 mmol/L — ABNORMAL LOW (ref 135–145)

## 2021-09-25 LAB — DIGOXIN LEVEL: Digoxin Level: 0.6 ng/mL — ABNORMAL LOW (ref 0.8–2.0)

## 2021-09-25 MED ORDER — LOSARTAN POTASSIUM 25 MG PO TABS: 12.5000 mg | ORAL_TABLET | Freq: Every day | ORAL | 4 refills | Status: DC

## 2021-09-25 NOTE — Addendum Note (Signed)
Encounter addended by: Rafael Bihari, FNP on: 09/25/2021 2:51 PM  Actions taken: Order Reconciliation Section accessed, Clinical Note Signed

## 2021-09-25 NOTE — Patient Instructions (Signed)
Thank you for coming in  Labs were done today if any labs are abnormal the clinic will call you  STOP Hydralazine  STOP Imdur  DECREASE Losartan to 12.5 mg 1/2 tablet at bedtime   Your physician recommends that you schedule a follow-up appointment in: 4 weeks and then in 3 months with Dr. Tempie Donning your blood pressure at home notify clinic if systolic (top) blood pressure if less than 90  At the [facility], you and your health needs are our priority. As part of our continuing mission to provide you with exceptional heart care, we have created designated Provider Care Teams. These Care Teams include your primary Cardiologist (physician) and Advanced Practice Providers (APPs- Physician Assistants and Nurse Practitioners) who all work together to provide you with the care you need, when you need it.   You may see any of the following providers on your designated Care Team at your next follow up: Dr Glori Bickers Dr Haynes Kerns, NP Lyda Jester, [place] [facility] [place], [place] Audry Riles, PharmD   Please be sure to bring in all your medications bottles to every appointment.    If you have any questions or concerns before your next appointment please send [place] a message through [place] or call our office at [phone].    TO LEAVE A MESSAGE FOR THE NURSE SELECT OPTION 2, PLEASE LEAVE A MESSAGE INCLUDING: YOUR NAME DATE OF BIRTH CALL BACK NUMBER REASON FOR CALL**this is important as we prioritize the call backs  YOU WILL RECEIVE A CALL BACK THE SAME DAY AS LONG AS YOU CALL BEFORE 4:00 PM

## 2021-09-25 NOTE — Addendum Note (Signed)
Encounter addended by: Rafael Bihari, FNP on: 09/25/2021 5:10 PM  Actions taken: Clinical Note Signed

## 2021-09-25 NOTE — Progress Notes (Signed)
ReDS Vest / Clip - 09/25/21 0900       ReDS Vest / Clip   Station Marker A    Ruler Value 25    ReDS Value Range Low volume    ReDS Actual Value 32

## 2021-09-27 NOTE — Progress Notes (Signed)
EPIC Encounter for ICM Monitoring  Patient Name: Daniel Li is a 65 y.o. male Date: 09/27/2021 Primary Care Physican: [name], NP Primary Cardiologist: [facility] Electrophysiologist: Allred 09/25/2021 Office Weight: 138 lbs                                                  Transmission reviewed.  Pt seen by Advanced HF clinic on 11/28.   CorVue thoracic impedance suggesting possible dryness starting 11/23 but trending back toward baseline normal.  Also suggesting possible fluid accumulation from 11/9-11/18.   Prescribed dosage:  Torsemide 20 mg Take one tablet (20 mg) by mouth daily Potassium 20 mEq take 1 tablet daily. Spironolactone 25 mg take 1 tablet daily   Labs: 09/25/2021 Creatinine 1.42, BUN 21, Potassium 3.7, Sodium 132, GFR 55 06/21/2021 Creatinine 1.17, BUN 17, Potassium 4.2, Sodium 140, GFR >60 04/24/2021 Creatinine 1.22, BUN 17, Potassium 4.2, Sodium 137, GFR >60 12/06/2020 Creatinine 1.28, BUN 10, Potassium 4.8, Sodium 140, GFR >60 11/07/2020 Creatinine 1.25, BUN 15, Potassium 3.7, Sodium 128, GFR >60  A complete set of results can be found in Results Review.   Recommendations:  No changes   Follow-up plan: ICM clinic phone appointment on 11/06/2021.    91 day device clinic remote transmission 11/13/2021.     EP/Cardiology Office Visits: 10/24/2021 with HF clinic.   Copy of ICM check sent to Dr. Rayann Heman.  3 month ICM trend: 09/25/2021.    12-14 Month ICM trend:       Rosalene Billings, RN 09/27/2021 10:18 AM

## 2021-10-05 ENCOUNTER — Other Ambulatory Visit: Payer: Self-pay | Admitting: Cardiology

## 2021-10-09 ENCOUNTER — Ambulatory Visit (INDEPENDENT_AMBULATORY_CARE_PROVIDER_SITE_OTHER): Payer: Medicare Other | Admitting: *Deleted

## 2021-10-09 ENCOUNTER — Other Ambulatory Visit: Payer: Self-pay | Admitting: Cardiology

## 2021-10-09 DIAGNOSIS — Z5181 Encounter for therapeutic drug level monitoring: Secondary | ICD-10-CM

## 2021-10-09 DIAGNOSIS — I255 Ischemic cardiomyopathy: Secondary | ICD-10-CM

## 2021-10-09 DIAGNOSIS — I236 Thrombosis of atrium, auricular appendage, and ventricle as current complications following acute myocardial infarction: Secondary | ICD-10-CM

## 2021-10-09 LAB — POCT INR: INR: 2.2 (ref 2.0–3.0)

## 2021-10-09 NOTE — Patient Instructions (Signed)
Continue warfarin 5 mg (1 tablet daily) except for 7.5 mg (1.5 tablets on Tuesdays). Recheck INR in 4 weeks.  *No charge remote INRs, charge [phone] if in person d/t traditional Medicaid* New instructions sent to [facility]

## 2021-10-10 ENCOUNTER — Telehealth: Payer: Self-pay | Admitting: Cardiology

## 2021-10-10 NOTE — Telephone Encounter (Signed)
Pharmacy called asking that we send the package to them whenever the patient INR changes. The facility the patient in they dont always sending a copy to them. Please fax [phone] Please advise

## 2021-10-10 NOTE — Telephone Encounter (Signed)
Noted  

## 2021-10-11 NOTE — Progress Notes (Incomplete)
Advanced Heart Failure Clinic Note  PCP: [name], NP Primary Cardiologist: Dr. Harl Bowie HF Cardiology: Dr. Aundra Dubin   HPI: Daniel Li is a 65 y.o. male with long history of CAD s/p multiple ACS episodes, HTN, HLD, tobacco abuse, DM, OSA, CVA and systolic CHF.   He has a history of CAD with NSTEMI in 2007 with BMS placement to his D1 and OM2. Also with history of STEMI in May 2013 at which time he had a BMS placed to his RCA. NSTEMI in January of 2014, cath showed CTO of left circumflex, unable to intervene upon. EF 55-60% in September 2016. Anterior STEMI 4/18, DES to pLAD.   Admitted 8/9 - 06/11/17 with chest pain. Cath as below. No targets for PCI, LAD stent patent. EF remained low at 20-25% as below. Now with St Jude ICD.   Admitted 4/16 - 02/17/2018 with NSTEMI. Underwent LHC again with unchanged anatomy, no intervention. Discharged back to assisted living.   He was admitted again in 10/19 with NSTEMI.  LHC showed totally occluded PLV, 75% PDA stenosis, 80% mid LAD, occluded LCx.  He had DES to mLAD. Repeat echo showed EF 20-25%.    RHC/LHC in 9/21 showed unchanged coronaries with occluded PLV and proximal LCx with collaterals, patent LAD, 60% D2, 60% PDA. Normal filling pressures and relatively preserved cardiac output.   Echo in 5/22 showed EF 25% with mild LV dilation, no LV thrombus, normal RV, mild MR.   Today he returns for HF follow up. He lives at [facility] in [place]. He has a [place] aide who helps with his medications. Has been dizzy for the last 3 days. Says he checks BP at home and runs around 100/90s recently. He does not have dyspnea walking on flat ground,  but does get SOB with stairs. Denies abnormal bleeding, palpitations, CP, edema, or PND/Orthopnea. Appetite ok. No fever or chills. Taking all medications.   ECG (personally reviewed): NSR w/ PVCs, lateral TWI  Device interrogation: CoreVue stable, daily impedence up suggesting normal fluid levels,  no VT (personally reviewed).  Labs (8/18): K 3.7, creatinine 1.42 Labs (9/18): digoxin < 0.2 Labs (11/18): K 3.8, creatinine 1.03 Labs (12/18): K 4, creatinine 1.27, digoxin 0.2, hgb 14.2, LDL 51, HDL 49 Labs (03/31/2018): K 3.8 Creatinine 1.1 dig level 0.3  Labs (8/19): K 3.5, creatinine 0.98, BNP 88, digoxin 0.4 Labs (9/19): K 3.9 Creatinine 1.34, LDL 64, TGs 151 Labs (10/19): K 4.2, creatinine 1.29, hgb 12.3 Labs (3/20): K 4.5, creatinine 1.2 Labs (2/22): digoxin 0.9, K 4.8, creatinine 1.28 Labs (6/22): digoxin 0.6, K 4.2, creatinine 1.22 Labs (8/22): K 4.2, creatinine 1.17  Review of systems complete and found to be negative unless listed in HPI.    Past Medical History 1. OSA: Supposed to be using CPAP.  2. HTN 3. CAD: Long history.  - NSTEMI 2007 with BMS D1 and OM2. - STEMI 5/13 with BMS RCA - NSTEMI 1/14 with CTO LCx and PLV, unable to open LCx.  - STEMI 4/18 with LHC showing old TO LCx with collaterals, old TO PLV, 70% ostial D1, totally occluded proximal LAD with some collaterals => DES to LAD.  - LHC 8/18 with old TO LCx, old TO PLV, 60% mLAD, patent LAD stent.  - LHC 4/19 with totally occluded PLV, totally occluded LCx, patent LAD and D1 stents, 50% mid LAD.  - NSTEMI 10/19. LHC with totally occluded PLV, totally occluded LCx, patent proximal LAD and D1 stents, 80% mid  LAD stenosis => DES to mid LAD.  - LHC (9/21): occluded PLV and proximal LCx with collaterals, patent LAD, 60% D2, 60% PDA.  No intervention.  4. Depression 5. Type II DM 6. Hyperlipidemia: History of rhabdomyolysis with statins, he is on Zetia.  7. Chronic systolic CHF: Ischemic cardiomyopathy.  Echo from 9/16 showed improvement in EF to 55-60%.  St Jude ICD.  - Echo (4/18) with EF 20-25%, dyskinetic apex, moderate-severe MR, severe LAE.  - Echo (8/18) with EF 20-25%, moderate to severe MR - Echo (4/19) with EF 20-25%  - Echo (10/19) with EF 20-25%, no LV thrombus.  - RHC (10/19): mean RA 4, PA 22/7,  mean PCWP 7, CI 2.3 - RHC (9/21): mean RA 1, PA 18/5, mean PCWP 7, CI 2.12 (F)/2.17(T) - Echo (5/22): EF 25% with mild LV dilation, no LV thrombus, normal RV, mild MR. 8. PAD: - ABIs (4/18) with R ABI of 0.84 suggestive of mild disease.  L ABI of 1.27 (Normal flow at rest) - ABIs (5/19): abnormal TBI on right.  - ABIs (10/21): Normal 9. LV thrombus 10. H/o CVA   Current Outpatient Medications  Medication Sig Dispense Refill   acetaminophen (TYLENOL) 325 MG tablet Take 650 mg by mouth every 6 (six) hours as needed for mild pain, fever or headache.      carvedilol (COREG) 12.5 MG tablet TAKE 1.5 TABLETS(18.75MG ) BY MOUTH 2 TIMES A DAY 90 tablet 0   digoxin (LANOXIN) 0.125 MG tablet TAKE 1 TABLET BY MOUTH ONCE DAILY. 90 tablet 0   empagliflozin (JARDIANCE) 10 MG TABS tablet Take 10 mg by mouth daily.     ezetimibe (ZETIA) 10 MG tablet Take 1 tablet (10 mg total) by mouth daily. 30 tablet 3   fenofibrate (TRICOR) 145 MG tablet TAKE ONE TABLET BY MOUTH EVERY DAY. 30 tablet 3   gabapentin (NEURONTIN) 100 MG capsule Take 100 mg by mouth 2 (two) times daily. As needed     losartan (COZAAR) 25 MG tablet Take 0.5 tablets (12.5 mg total) by mouth at bedtime. 30 tablet 4   meclizine (ANTIVERT) 25 MG tablet Take 12.5 mg by mouth 3 (three) times daily as needed for dizziness.     metFORMIN (GLUCOPHAGE) 500 MG tablet Take 1,000 mg by mouth 2 (two) times daily with a meal.     nitroGLYCERIN (NITROSTAT) 0.4 MG SL tablet PLACE ONE (1) TABLET UNDER TONGUE EVERY 5 MINUTES UP TO (3) DOSES AS NEEDED FOR CHEST PAIN. IF NO RELIEF, CONTACT MD. 25 tablet 3   potassium chloride SA (K-DUR,KLOR-CON) 20 MEQ tablet Take 1 tablet (20 mEq total) by mouth daily. 30 tablet 3   REPATHA SURECLICK 831 MG/ML SOAJ INJECT CONTENTS OF ONE PREFILLED PEN SUBCUTANEOUSLY INTO THE SKIN EVERY 14 DAYS. 2 mL 11   spironolactone (ALDACTONE) 25 MG tablet TAKE ONE TABLET BY MOUTH AT BEDTIME. 30 tablet 3   torsemide (DEMADEX) 20 MG tablet TAKE  ONE TABLET BY MOUTH EVERY DAY.TAKE 30 MINUTES AFTER THE SAME MEAL EACH DAY. 30 tablet 0   warfarin (COUMADIN) 5 MG tablet TAKE 7.5MG  BY MOUTH TONIGHT, THEN 5MG  DAILY, EXCEPT 7.5MG  ON TUESDAYS. AS DIRECTED. 40 tablet 3   No current facility-administered medications for this visit.   Allergies  Allergen Reactions   Contrast Media [Iodinated Diagnostic Agents] Other (See Comments)    Does not eat for religious reasons - okay with using IV heparin   Pork-Derived Products Other (See Comments)    Does not eat for religious  reasons - okay with using IV heparin   Social History   Socioeconomic History   Marital status: Single    Spouse name: Not on file   Number of children: 0   Years of education: 9   Highest education level: 9th grade  Occupational History   Not on file  Tobacco Use   Smoking status: Some Days    Packs/day: 0.50    Years: 20.00    Pack years: 10.00    Types: Cigarettes, Cigars    Start date: 07/21/1977   Smokeless tobacco: Never  Vaping Use   Vaping Use: Never used  Substance and Sexual Activity   Alcohol use: Yes    Comment: 09/17/2017 "nothing since 05/2017"   Drug use: Not Currently    Comment: previously incarcerated for drug related offense.   Sexual activity: Not Currently  Other Topics Concern   Not on file  Social History Narrative   Not on file   Social Determinants of Health   Financial Resource Strain: Not on file  Food Insecurity: Not on file  Transportation Needs: Not on file  Physical Activity: Not on file  Stress: Not on file  Social Connections: Not on file  Intimate Partner Violence: Not on file   Family History  Problem Relation Age of Onset   Stroke Mother    Heart attack Mother    Heart attack Father    Stroke Sister    Heart attack Sister    Heart attack Brother    Stroke Brother    Liver disease Neg Hx    Colon cancer Neg Hx    There were no vitals taken for this visit.  Wt Readings from Last 3 Encounters:  09/25/21 63  kg (138 lb 12.8 oz)  06/21/21 63.8 kg (140 lb 9.6 oz)  05/25/21 63 kg (138 lb 12.8 oz)   PHYSICAL EXAM: ReDs 32% General:  NAD. No resp difficulty, weak-appearing. HEENT: Normal Neck: Supple. No JVD. Carotids 2+ bilat; no bruits. No lymphadenopathy or thryomegaly appreciated. Cor: PMI nondisplaced. Regular rate & rhythm. No rubs, gallops or murmurs. Lungs: Clear Abdomen: Soft, nontender, nondistended. No hepatosplenomegaly. No bruits or masses. Good bowel sounds. Extremities: No cyanosis, clubbing, rash, edema Neuro: Alert & oriented x 3, cranial nerves grossly intact. Moves all 4 extremities w/o difficulty. Affect pleasant.  ASSESSMENT & PLAN: 1. CAD:  Extensive prior history of CAD.  He has known total occlusion LCx and PLV.  NSTEMI 10/19 with DES to mid LAD.  Coronary angiography in 9/21 showed stable coronary disease, no intervention was done.  No recent chest pain.  - No ASA given stable coronary disease on warfarin.    - No statin given history of rhabdomyolysis on statin.  He is now on Repatha and Zetia. Lipids ok 8/22. 2. Chronic systolic CHF: Ischemic cardiomyopathy, St Jude ICD. Echo in 10/19 with EF 20-25%.  Echo in 5/22 with EF 25%, normal RV.  He is not overloaded on exam, Reds 32%, NYHA class II symptoms. - With BP in 78/58, stop hydralazine and Imdur. - Decrease losartan to 12.5 mg q hs (He did not tolerate Entresto due to lightheadedness). - Continue Coreg 18.75 mg bid.  - Continue torsemide 20 mg daily.  BMET today.  - Continue spironolactone 25 mg daily.  - Continue digoxin 0.125, check level today.   - Continue Jardiance 10 mg daily.  3. Diabetes: Taking SGLT2i. 4. Smoking: He has quit smoking.  5. PAD: No claudication, foot ulcers. ABIs in  10/21 were normal.    6. LV thrombus:  On coumadin as above. No bleeding problems.  [facility]. 7. 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  - On warfarin for LV thrombus. Needs Lovenox bridge if he  stops warfarin.  8. HTN: Low BP today, historically runs ~ 100/60s. He is not hypovolemic on exam. I think dizziness is likely due to medications, see changes above. - Check CBC.  Followup in 4 weeks for APP for BP check (if he remains dizzy, may need to stop digoxin) and 3 months with Dr. Aundra Dubin.  [place], FNP 10/11/21

## 2021-10-12 ENCOUNTER — Encounter (HOSPITAL_COMMUNITY): Payer: Medicare Other

## 2021-10-24 ENCOUNTER — Encounter (HOSPITAL_COMMUNITY): Payer: Medicare Other

## 2021-11-02 ENCOUNTER — Other Ambulatory Visit (HOSPITAL_COMMUNITY): Payer: Self-pay | Admitting: Cardiology

## 2021-11-09 ENCOUNTER — Telehealth: Payer: Self-pay

## 2021-11-09 NOTE — Telephone Encounter (Signed)
The patient states he was in the hospital on 11-06-2021. He agreed to send a transmission today.

## 2021-11-10 LAB — CUP PACEART REMOTE DEVICE CHECK
Battery Remaining Longevity: 66 mo
Battery Remaining Percentage: 64 %
Battery Voltage: 2.98 V
Brady Statistic RV Percent Paced: 1 %
Date Time Interrogation Session: 20230112154139
HighPow Impedance: 57 Ohm
HighPow Impedance: 57 Ohm
Implantable Lead Implant Date: 20181120
Implantable Lead Location: 753860
Implantable Pulse Generator Implant Date: 20181120
Lead Channel Impedance Value: 300 Ohm
Lead Channel Pacing Threshold Amplitude: 0.75 V
Lead Channel Pacing Threshold Pulse Width: 0.5 ms
Lead Channel Sensing Intrinsic Amplitude: 10 mV
Lead Channel Setting Pacing Amplitude: 2.5 V
Lead Channel Setting Pacing Pulse Width: 0.5 ms
Lead Channel Setting Sensing Sensitivity: 0.5 mV
Pulse Gen Serial Number: 9786940

## 2021-11-13 ENCOUNTER — Ambulatory Visit (INDEPENDENT_AMBULATORY_CARE_PROVIDER_SITE_OTHER): Payer: Medicare Other

## 2021-11-13 DIAGNOSIS — I255 Ischemic cardiomyopathy: Secondary | ICD-10-CM | POA: Diagnosis not present

## 2021-11-13 NOTE — Progress Notes (Signed)
Advanced Heart Failure Clinic Note  PCP: [name], NP Primary Cardiologist: Dr. Harl Bowie HF Cardiology: Dr. Aundra Dubin   HPI: Daniel Li is a 66 y.o. male with long history of CAD s/p multiple ACS episodes, HTN, HLD, tobacco abuse, DM, OSA, CVA and systolic CHF.   He has a history of CAD with NSTEMI in 2007 with BMS placement to his D1 and OM2. Also with history of STEMI in May 2013 at which time he had a BMS placed to his RCA. NSTEMI in January of 2014, cath showed CTO of left circumflex, unable to intervene upon. EF 55-60% in September 2016. Anterior STEMI 4/18, DES to pLAD.   Admitted 8/9 - 06/11/17 with chest pain. Cath as below. No targets for PCI, LAD stent patent. EF remained low at 20-25% as below. Now with St Jude ICD.   Admitted 4/16 - 02/17/2018 with NSTEMI. Underwent LHC again with unchanged anatomy, no intervention. Discharged back to assisted living.   He was admitted again in 10/19 with NSTEMI.  LHC showed totally occluded PLV, 75% PDA stenosis, 80% mid LAD, occluded LCx.  He had DES to mLAD. Repeat echo showed EF 20-25%.    RHC/LHC in 9/21 showed unchanged coronaries with occluded PLV and proximal LCx with collaterals, patent LAD, 60% D2, 60% PDA. Normal filling pressures and relatively preserved cardiac output.   Echo in 5/22 showed EF 25% with mild LV dilation, no LV thrombus, normal RV, mild MR.   Follow up 11/22 he was more dizzy. BP checks ~ 100/90s. BP low at visit (78/58), ReDs 32%.  Hydral/Imdur stopped and losartan decreased to 12.5.  Admitted 1/23 at [facility] for hypotension and AKI. Echo ordered but not completed. Received IVF and beta blocker and losartan stopped at discharge. He was discharged back to facility.  Today he returns for post hospital HF follow up. Overall feeling fine. Living at [facility] in [place]. Has a [place] aide who helps with his medications.  Dizziness has resolved. He is not short of breath walking on flat ground. Denies  palpitations, CP, edema, or PND/Orthopnea. Appetite ok. No fever or chills. Weight is up about 10 lbs. Taking all medications provided at facility.   ECG (personally reviewed): SR with PVC  Device interrogation: CoreVue suggests fluid accumulation x 1 week, no VT, < 1 % v-pacing (personally reviewed).  Labs (8/18): K 3.7, creatinine 1.42 Labs (9/18): digoxin < 0.2 Labs (11/18): K 3.8, creatinine 1.03 Labs (12/18): K 4, creatinine 1.27, digoxin 0.2, hgb 14.2, LDL 51, HDL 49 Labs (03/31/2018): K 3.8 Creatinine 1.1 dig level 0.3  Labs (8/19): K 3.5, creatinine 0.98, BNP 88, digoxin 0.4 Labs (9/19): K 3.9 Creatinine 1.34, LDL 64, TGs 151 Labs (10/19): K 4.2, creatinine 1.29, hgb 12.3 Labs (3/20): K 4.5, creatinine 1.2 Labs (2/22): digoxin 0.9, K 4.8, creatinine 1.28 Labs (6/22): digoxin 0.6, K 4.2, creatinine 1.22 Labs (8/22): K 4.2, creatinine 1.17 Labs (1/23): K 3.9, creatinine 0.91  Review of systems complete and found to be negative unless listed in HPI.    Past Medical History 1. OSA: Supposed to be using CPAP.  2. HTN 3. CAD: Long history.  - NSTEMI 2007 with BMS D1 and OM2. - STEMI 5/13 with BMS RCA - NSTEMI 1/14 with CTO LCx and PLV, unable to open LCx.  - STEMI 4/18 with LHC showing old TO LCx with collaterals, old TO PLV, 70% ostial D1, totally occluded proximal LAD with some collaterals => DES to LAD.  - LHC  8/18 with old TO LCx, old TO PLV, 60% mLAD, patent LAD stent.  - LHC 4/19 with totally occluded PLV, totally occluded LCx, patent LAD and D1 stents, 50% mid LAD.  - NSTEMI 10/19. LHC with totally occluded PLV, totally occluded LCx, patent proximal LAD and D1 stents, 80% mid LAD stenosis => DES to mid LAD.  - LHC (9/21): occluded PLV and proximal LCx with collaterals, patent LAD, 60% D2, 60% PDA.  No intervention.  4. Depression 5. Type II DM 6. Hyperlipidemia: History of rhabdomyolysis with statins, he is on Zetia.  7. Chronic systolic CHF: Ischemic cardiomyopathy.  Echo  from 9/16 showed improvement in EF to 55-60%.  St Jude ICD.  - Echo (4/18) with EF 20-25%, dyskinetic apex, moderate-severe MR, severe LAE.  - Echo (8/18) with EF 20-25%, moderate to severe MR - Echo (4/19) with EF 20-25%  - Echo (10/19) with EF 20-25%, no LV thrombus.  - RHC (10/19): mean RA 4, PA 22/7, mean PCWP 7, CI 2.3 - RHC (9/21): mean RA 1, PA 18/5, mean PCWP 7, CI 2.12 (F)/2.17(T) - Echo (5/22): EF 25% with mild LV dilation, no LV thrombus, normal RV, mild MR. 8. PAD: - ABIs (4/18) with R ABI of 0.84 suggestive of mild disease.  L ABI of 1.27 (Normal flow at rest) - ABIs (5/19): abnormal TBI on right.  - ABIs (10/21): Normal 9. LV thrombus 10. H/o CVA   Current Outpatient Medications  Medication Sig Dispense Refill   acetaminophen (TYLENOL) 325 MG tablet Take 650 mg by mouth every 6 (six) hours as needed for mild pain, fever or headache.      digoxin (LANOXIN) 0.125 MG tablet TAKE 1 TABLET BY MOUTH ONCE DAILY. 90 tablet 0   empagliflozin (JARDIANCE) 10 MG TABS tablet Take 10 mg by mouth daily.     ezetimibe (ZETIA) 10 MG tablet Take 1 tablet (10 mg total) by mouth daily. 30 tablet 3   fenofibrate (TRICOR) 145 MG tablet TAKE ONE TABLET BY MOUTH EVERY DAY. 30 tablet 3   gabapentin (NEURONTIN) 100 MG capsule Take 100 mg by mouth 2 (two) times daily. As needed     meclizine (ANTIVERT) 25 MG tablet Take 12.5 mg by mouth 3 (three) times daily as needed for dizziness.     metFORMIN (GLUCOPHAGE) 500 MG tablet Take 1,000 mg by mouth 2 (two) times daily with a meal.     nitroGLYCERIN (NITROSTAT) 0.4 MG SL tablet PLACE ONE (1) TABLET UNDER TONGUE EVERY 5 MINUTES UP TO (3) DOSES AS NEEDED FOR CHEST PAIN. IF NO RELIEF, CONTACT MD. 25 tablet 3   potassium chloride SA (K-DUR,KLOR-CON) 20 MEQ tablet Take 1 tablet (20 mEq total) by mouth daily. 30 tablet 3   REPATHA SURECLICK 970 MG/ML SOAJ INJECT CONTENTS OF ONE PREFILLED PEN SUBCUTANEOUSLY INTO THE SKIN EVERY 14 DAYS. 2 mL 11   spironolactone  (ALDACTONE) 25 MG tablet TAKE ONE TABLET BY MOUTH AT BEDTIME. 30 tablet 3   torsemide (DEMADEX) 20 MG tablet TAKE ONE TABLET BY MOUTH EVERY DAY.TAKE 30 MINUTES AFTER THE SAME MEAL EACH DAY. 30 tablet 0   warfarin (COUMADIN) 5 MG tablet TAKE 7.5MG  BY MOUTH TONIGHT, THEN 5MG  DAILY, EXCEPT 7.5MG  ON TUESDAYS. AS DIRECTED. 40 tablet 3   No current facility-administered medications for this encounter.   Allergies  Allergen Reactions   Contrast Media [Iodinated Contrast Media] Other (See Comments)    Does not eat for religious reasons - okay with using IV heparin  Pork-Derived Products Other (See Comments)    Does not eat for religious reasons - okay with using IV heparin   Social History   Socioeconomic History   Marital status: Single    Spouse name: Not on file   Number of children: 0   Years of education: 9   Highest education level: 9th grade  Occupational History   Not on file  Tobacco Use   Smoking status: Some Days    Packs/day: 0.50    Years: 20.00    Pack years: 10.00    Types: Cigarettes, Cigars    Start date: 07/21/1977   Smokeless tobacco: Never  Vaping Use   Vaping Use: Never used  Substance and Sexual Activity   Alcohol use: Yes    Comment: 09/17/2017 "nothing since 05/2017"   Drug use: Not Currently    Comment: previously incarcerated for drug related offense.   Sexual activity: Not Currently  Other Topics Concern   Not on file  Social History Narrative   Not on file   Social Determinants of Health   Financial Resource Strain: Not on file  Food Insecurity: Not on file  Transportation Needs: Not on file  Physical Activity: Not on file  Stress: Not on file  Social Connections: Not on file  Intimate Partner Violence: Not on file   Family History  Problem Relation Age of Onset   Stroke Mother    Heart attack Mother    Heart attack Father    Stroke Sister    Heart attack Sister    Heart attack Brother    Stroke Brother    Liver disease Neg Hx     Colon cancer Neg Hx    BP 106/68    Pulse 79    Wt 67.4 kg (148 lb 9.6 oz)    SpO2 98%    BMI 23.98 kg/m   Wt Readings from Last 3 Encounters:  11/14/21 67.4 kg (148 lb 9.6 oz)  09/25/21 63 kg (138 lb 12.8 oz)  06/21/21 63.8 kg (140 lb 9.6 oz)   PHYSICAL EXAM: General:  NAD. No resp difficulty, walked into clinic with cane HEENT: Normal Neck: Supple. JVP 7-8. Carotids 2+ bilat; no bruits. No lymphadenopathy or thryomegaly appreciated. Cor: PMI nondisplaced. Regular rate & rhythm. No rubs, gallops or murmurs. Lungs: Clear Abdomen: Soft, nontender, nondistended. No hepatosplenomegaly. No bruits or masses. Good bowel sounds. Extremities: No cyanosis, clubbing, rash, edema Neuro: Alert & oriented x 3, cranial nerves grossly intact. Moves all 4 extremities w/o difficulty. Affect pleasant. + dysarthria.  ASSESSMENT & PLAN: 1. Chronic systolic CHF: Ischemic cardiomyopathy, St Jude ICD. Echo in 10/19 with EF 20-25%.  Echo in 5/22 with EF 25%, normal RV.  Recent admission at [facility] for dehydration and hypotension. Beta blocker and losartan stopped. He is NYHA II today. He is mildly volume overloaded on exam and by CoreVue. Weight is up 10 lbs, likely 2/2 to fluid resuscitation during recent hospitalization. - Increase torsemide to 40 mg daily + increase KCL to 40 mEq daily. BMET/BNP today, BMET in 7-10 days. - No BP room to add back carvedilol or losartan (did not tolerate Entresto due to lightheadedness). - Continue spironolactone 25 mg daily.  - Continue digoxin 0.125, check level today.   - Continue Jardiance 10 mg daily.  - Arrange for echo. 2. CAD:  Extensive prior history of CAD.  He has known total occlusion LCx and PLV.  NSTEMI 10/19 with DES to mid LAD.  Coronary angiography in  9/21 showed stable coronary disease, no intervention was done.  No recent chest pain.  - No ASA given stable coronary disease on warfarin.    - No statin given history of rhabdomyolysis on statin.  He is now on  Repatha and Zetia. Lipids ok 8/22. 3. Diabetes: Taking SGLT2i. 4. Smoking: He occasionally will take a puff on a cigarette. Encouraged complete cessation. 5. PAD: No claudication, foot ulcers. ABIs in 10/21 were normal.    6. LV thrombus:  On coumadin as above. No bleeding problems.  [facility]. 7. 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  - On warfarin for LV thrombus. Needs Lovenox bridge if he stops warfarin.  8. HTN: Well-controlled today. Remains off Imdur/hydralazine, carvedilol and losartan.   Followup with Dr. Aundra Dubin + echo as scheduled next month.  [place]uth, FNP 11/14/21

## 2021-11-14 ENCOUNTER — Encounter (HOSPITAL_COMMUNITY): Payer: Self-pay

## 2021-11-14 ENCOUNTER — Other Ambulatory Visit: Payer: Self-pay

## 2021-11-14 ENCOUNTER — Ambulatory Visit (HOSPITAL_COMMUNITY)
Admission: RE | Admit: 2021-11-14 | Discharge: 2021-11-14 | Disposition: A | Discharge status: Home / Self Care | Payer: Medicare Other | Source: Ambulatory Visit | Attending: Family Medicine | Admitting: Family Medicine

## 2021-11-14 ENCOUNTER — Other Ambulatory Visit: Payer: Self-pay | Admitting: Cardiology

## 2021-11-14 VITALS — BP 106/68 | HR 79 | Wt 148.6 lb

## 2021-11-14 DIAGNOSIS — I5022 Chronic systolic (congestive) heart failure: Secondary | ICD-10-CM | POA: Diagnosis not present

## 2021-11-14 DIAGNOSIS — Z79899 Other long term (current) drug therapy: Secondary | ICD-10-CM | POA: Diagnosis not present

## 2021-11-14 DIAGNOSIS — I236 Thrombosis of atrium, auricular appendage, and ventricle as current complications following acute myocardial infarction: Secondary | ICD-10-CM

## 2021-11-14 DIAGNOSIS — E785 Hyperlipidemia, unspecified: Secondary | ICD-10-CM | POA: Insufficient documentation

## 2021-11-14 DIAGNOSIS — Z09 Encounter for follow-up examination after completed treatment for conditions other than malignant neoplasm: Secondary | ICD-10-CM | POA: Insufficient documentation

## 2021-11-14 DIAGNOSIS — I255 Ischemic cardiomyopathy: Secondary | ICD-10-CM | POA: Diagnosis not present

## 2021-11-14 DIAGNOSIS — Z7984 Long term (current) use of oral hypoglycemic drugs: Secondary | ICD-10-CM | POA: Insufficient documentation

## 2021-11-14 DIAGNOSIS — Z8673 Personal history of transient ischemic attack (TIA), and cerebral infarction without residual deficits: Secondary | ICD-10-CM | POA: Diagnosis not present

## 2021-11-14 DIAGNOSIS — E119 Type 2 diabetes mellitus without complications: Secondary | ICD-10-CM | POA: Insufficient documentation

## 2021-11-14 DIAGNOSIS — E1159 Type 2 diabetes mellitus with other circulatory complications: Secondary | ICD-10-CM

## 2021-11-14 DIAGNOSIS — I493 Ventricular premature depolarization: Secondary | ICD-10-CM | POA: Insufficient documentation

## 2021-11-14 DIAGNOSIS — Z7901 Long term (current) use of anticoagulants: Secondary | ICD-10-CM | POA: Diagnosis not present

## 2021-11-14 DIAGNOSIS — I5042 Chronic combined systolic (congestive) and diastolic (congestive) heart failure: Secondary | ICD-10-CM | POA: Diagnosis present

## 2021-11-14 DIAGNOSIS — F17210 Nicotine dependence, cigarettes, uncomplicated: Secondary | ICD-10-CM | POA: Diagnosis not present

## 2021-11-14 DIAGNOSIS — I110 Hypertensive heart disease with heart failure: Secondary | ICD-10-CM | POA: Diagnosis not present

## 2021-11-14 DIAGNOSIS — Z86718 Personal history of other venous thrombosis and embolism: Secondary | ICD-10-CM | POA: Diagnosis not present

## 2021-11-14 DIAGNOSIS — G4733 Obstructive sleep apnea (adult) (pediatric): Secondary | ICD-10-CM | POA: Diagnosis not present

## 2021-11-14 DIAGNOSIS — I252 Old myocardial infarction: Secondary | ICD-10-CM | POA: Insufficient documentation

## 2021-11-14 DIAGNOSIS — I2510 Atherosclerotic heart disease of native coronary artery without angina pectoris: Secondary | ICD-10-CM

## 2021-11-14 DIAGNOSIS — Z720 Tobacco use: Secondary | ICD-10-CM

## 2021-11-14 DIAGNOSIS — I10 Essential (primary) hypertension: Secondary | ICD-10-CM

## 2021-11-14 DIAGNOSIS — I739 Peripheral vascular disease, unspecified: Secondary | ICD-10-CM

## 2021-11-14 LAB — BASIC METABOLIC PANEL
Anion gap: 7 (ref 5–15)
BUN: 8 mg/dL (ref 8–23)
CO2: 27 mmol/L (ref 22–32)
Calcium: 8.9 mg/dL (ref 8.9–10.3)
Chloride: 104 mmol/L (ref 98–111)
Creatinine, Ser: 0.97 mg/dL (ref 0.61–1.24)
GFR, Estimated: >60 mL/min (ref >60)
Glucose, Bld: 102 mg/dL — ABNORMAL HIGH (ref 70–99)
Potassium: 3.8 mmol/L (ref 3.5–5.1)
Sodium: 138 mmol/L (ref 135–145)

## 2021-11-14 LAB — CBC
HCT: 35.3 % — ABNORMAL LOW (ref 39.0–52.0)
Hemoglobin: 11.7 g/dL — ABNORMAL LOW (ref 13.0–17.0)
MCH: 26.2 pg (ref 26.0–34.0)
MCHC: 33.1 g/dL (ref 30.0–36.0)
MCV: 79 fL — ABNORMAL LOW (ref 80.0–100.0)
Platelets: 232 10*3/uL (ref 150–400)
RBC: 4.47 MIL/uL (ref 4.22–5.81)
RDW: 16.9 % — ABNORMAL HIGH (ref 11.5–15.5)
WBC: 5.9 10*3/uL (ref 4.0–10.5)
nRBC: 0 % (ref 0.0–0.2)

## 2021-11-14 LAB — DIGOXIN LEVEL: Digoxin Level: <0.2 ng/mL — ABNORMAL LOW (ref 0.8–2.0)

## 2021-11-14 LAB — BRAIN NATRIURETIC PEPTIDE: B Natriuretic Peptide: 271.3 pg/mL — ABNORMAL HIGH (ref 0.0–100.0)

## 2021-11-14 MED ORDER — TORSEMIDE 20 MG PO TABS: 40.0000 mg | ORAL_TABLET | Freq: Every day | ORAL | 3 refills | Status: DC

## 2021-11-14 MED ORDER — POTASSIUM CHLORIDE CRYS ER 20 MEQ PO TBCR: 40.0000 meq | EXTENDED_RELEASE_TABLET | Freq: Every day | ORAL | 3 refills | Status: DC

## 2021-11-14 NOTE — Progress Notes (Signed)
No ICM remote transmission received for 11/06/2021 and next ICM transmission scheduled for 11/28/2021.

## 2021-11-14 NOTE — Patient Instructions (Signed)
Increase Torsemide to 40 mg (2 tabs) Daily  Increase Potassium to 40 meq (2 tabs) Daily  Labs done today, your results will be available in MyChart, we will contact you for abnormal readings.  Your physician recommends that you return for lab work in: 10-14 days, we have provided you with a prescription to have this done locally at [facility] or [place] recommends that you schedule a follow-up appointment in: 5 weeks with an echocardiogram (Feb 28 at 10 am for echo and 11 am with Dr Aundra Dubin)  Do the following things EVERYDAY: Weigh yourself in the morning before breakfast. Write it down and keep it in a log. Take your medicines as prescribed Eat low salt foods--Limit salt (sodium) to 2000 mg per day.  Stay as active as you can everyday Limit all fluids for the day to less than 2 liters  If you have any questions or concerns before your next appointment please send [place] a message through [place] or call our office at [phone].    TO LEAVE A MESSAGE FOR THE NURSE SELECT OPTION 2, PLEASE LEAVE A MESSAGE INCLUDING: YOUR NAME DATE OF BIRTH CALL BACK NUMBER REASON FOR CALL**this is important as we prioritize the call backs  YOU WILL RECEIVE A CALL BACK THE SAME DAY AS LONG AS YOU CALL BEFORE 4:00 PM  At the [facility], you and your health needs are our priority. As part of our continuing mission to provide you with exceptional heart care, we have created designated Provider Care Teams. These Care Teams include your primary Cardiologist (physician) and Advanced Practice Providers (APPs- Physician Assistants and Nurse Practitioners) who all work together to provide you with the care you need, when you need it.   You may see any of the following providers on your designated Care Team at your next follow up: Dr Glori Bickers Dr Haynes Kerns, NP Lyda Jester, [place] [facility] [place], [place] Audry Riles, PharmD   Please be  sure to bring in all your medications bottles to every appointment.

## 2021-11-16 ENCOUNTER — Telehealth (HOSPITAL_COMMUNITY): Payer: Self-pay

## 2021-11-16 ENCOUNTER — Ambulatory Visit (INDEPENDENT_AMBULATORY_CARE_PROVIDER_SITE_OTHER): Payer: Medicare Other | Admitting: *Deleted

## 2021-11-16 DIAGNOSIS — I255 Ischemic cardiomyopathy: Secondary | ICD-10-CM | POA: Diagnosis not present

## 2021-11-16 DIAGNOSIS — Z5181 Encounter for therapeutic drug level monitoring: Secondary | ICD-10-CM | POA: Diagnosis not present

## 2021-11-16 DIAGNOSIS — I236 Thrombosis of atrium, auricular appendage, and ventricle as current complications following acute myocardial infarction: Secondary | ICD-10-CM

## 2021-11-16 LAB — POCT INR: INR: 1.6 — AB (ref 2.0–3.0)

## 2021-11-16 MED ORDER — WARFARIN SODIUM 5 MG PO TABS: ORAL_TABLET | ORAL | 3 refills | Status: DC

## 2021-11-16 NOTE — Patient Instructions (Signed)
Take warfarin 10mg  tonight and 7.5 mg tomorrow night then continue  5 mg (1 tablet daily) except for 7.5 mg (1.5 tablets on Tuesdays). Recheck INR in 2 weeks.  *No charge remote INRs, charge [phone] if in person d/t traditional Medicaid*

## 2021-11-16 NOTE — Telephone Encounter (Addendum)
Pt aware and caregiver Lelon Frohlich, agreeable, and verbalized understanding. Script faxed    ----- Message from Rafael Bihari, FNP sent at 11/15/2021  4:06 PM EST ----- BNP elevated and Hgb lower than previous baseline. Please repeat CBC in 2 weeks.

## 2021-11-20 ENCOUNTER — Telehealth: Payer: Self-pay

## 2021-11-20 NOTE — Telephone Encounter (Signed)
Spoke with patient and advised to send remote transmission for review of fluid levels.

## 2021-11-22 NOTE — Telephone Encounter (Signed)
No ICM remote transmission received for 11/20/2021 and next ICM transmission scheduled for 11/28/2021.

## 2021-11-22 NOTE — Progress Notes (Signed)
Remote ICD transmission.   

## 2021-11-28 ENCOUNTER — Ambulatory Visit (INDEPENDENT_AMBULATORY_CARE_PROVIDER_SITE_OTHER): Payer: Medicare Other

## 2021-11-28 DIAGNOSIS — Z95810 Presence of automatic (implantable) cardiac defibrillator: Secondary | ICD-10-CM | POA: Diagnosis not present

## 2021-11-28 DIAGNOSIS — I5022 Chronic systolic (congestive) heart failure: Secondary | ICD-10-CM | POA: Diagnosis not present

## 2021-11-28 NOTE — Progress Notes (Signed)
EPIC Encounter for ICM Monitoring  Patient Name: Daniel Li is a 66 y.o. male Date: 11/28/2021 Primary Care Physican: [name], NP Primary Cardiologist: [facility] Electrophysiologist: Allred 11/14/2021 Office Weight: 148 lbs                                                  Transmission reviewed.  Hospitalization 1/25-129.     CorVue thoracic impedance suggesting normal fluid levels since hospital discharge.  Thoracic impedance was suggesting possible fluid accumulation from 1/9-1/21.   Prescribed dosage:  Torsemide 20 mg Take 1 tablet (20 mg total) by mouth daily as needed (For weight gain. Take one tablet if you gain 3 lbs in one day or 5 lbs in one week.) (changed from daily to PRN at 1/25 hospital discharge) Potassium 20 mEq take 1 tablet daily. Spironolactone stopped at 1/25 hospital discharge   Labs: 11/24/2021 Creatinine 0.87, BUN 8,   Potassium 4.1, Sodium 143 Care Everywhere 11/23/2021 Creatinine 0.99, BUN 12, Potassium 4.1, Sodium 145 Care Everywhere 11/22/2021 Creatinine 1.06, BUN 13, Potassium 4.2, Sodium 143 Care Everywhere 11/20/2020 Creatinine 1.39, BUN 18, Potassium 4.0, Sodium 139 Care Everywhere A complete set of results can be found in Results Review.   Recommendations:  No changes   Follow-up plan: ICM clinic phone appointment on 12/18/2021 to recheck fluid levels since Torsemide changed to PRN.  Will be checked at 2/10 HF clinic appt.    91 day device clinic remote transmission 02/12/2022.     EP/Cardiology Office Visits: 12/08/2021 with HF clinic.  12/26/2021 with Dr Aundra Dubin.   Copy of ICM check sent to Dr. Rayann Heman.  Sent to Allena Katz, NP as Juluis Rainier and requested after last OV.  3 month ICM trend: 11/28/2021.    12-14 Month ICM trend:     Rosalene Billings, RN 11/28/2021 3:30 PM

## 2021-11-30 ENCOUNTER — Ambulatory Visit (INDEPENDENT_AMBULATORY_CARE_PROVIDER_SITE_OTHER): Payer: Medicare Other | Admitting: *Deleted

## 2021-11-30 DIAGNOSIS — Z5181 Encounter for therapeutic drug level monitoring: Secondary | ICD-10-CM

## 2021-11-30 DIAGNOSIS — I255 Ischemic cardiomyopathy: Secondary | ICD-10-CM

## 2021-11-30 DIAGNOSIS — I236 Thrombosis of atrium, auricular appendage, and ventricle as current complications following acute myocardial infarction: Secondary | ICD-10-CM | POA: Diagnosis not present

## 2021-11-30 LAB — POCT INR: INR: 1.2 — AB (ref 2.0–3.0)

## 2021-11-30 NOTE — Patient Instructions (Signed)
Pt was admitted to [facility] with CP.[name] Kitchen  Transfered to [place] for heart cath. Repeat echo showed no LV thrombus and warfarin was d/c'd.  Pt was started on plavix *No charge remote INRs, charge [phone] if in person d/t traditional Medicaid*

## 2021-12-05 NOTE — Progress Notes (Addendum)
Advanced Heart Failure Clinic Note  PCP: [name], NP Primary Cardiologist: Dr. Harl Bowie HF Cardiology: Dr. Aundra Dubin   HPI: Daniel Li is a 66 y.o. male with long history of CAD s/p multiple ACS episodes, HTN, HLD, tobacco abuse, DM, OSA, CVA and systolic CHF.   He has a history of CAD with NSTEMI in 2007 with BMS placement to his D1 and OM2. Also with history of STEMI in May 2013 at which time he had a BMS placed to his RCA. NSTEMI in January of 2014, cath showed CTO of left circumflex, unable to intervene upon. EF 55-60% in September 2016. Anterior STEMI 4/18, DES to pLAD.   Admitted 8/9 - 06/11/17 with chest pain. Cath as below. No targets for PCI, LAD stent patent. EF remained low at 20-25% as below. Now with St Jude ICD.   Admitted 4/16 - 02/17/2018 with NSTEMI. Underwent LHC again with unchanged anatomy, no intervention. Discharged back to assisted living.   He was admitted again in 10/19 with NSTEMI.  LHC showed totally occluded PLV, 75% PDA stenosis, 80% mid LAD, occluded LCx.  He had DES to mLAD. Repeat echo showed EF 20-25%.    RHC/LHC in 9/21 showed unchanged coronaries with occluded PLV and proximal LCx with collaterals, patent LAD, 60% D2, 60% PDA. Normal filling pressures and relatively preserved cardiac output.   Echo in 5/22 showed EF 25% with mild LV dilation, no LV thrombus, normal RV, mild MR.   Follow up 11/22 he was more dizzy. BP checks ~ 100/90s. BP low at visit (78/58), ReDs 32%.  Hydral/Imdur stopped and losartan decreased to 12.5.  Admitted 11/05/21 at [facility] for hypotension and AKI. Echo ordered but not completed. Received IVF and beta blocker and losartan stopped at discharge. He was discharged back to facility.  Admitted 11/20/21 to [facility] with CP, concern for ACS. Underwent R/LHC showing significant 3-vessel disease with patent stents; no further stents were placed; CO 3.0 and low PWCP at 7 mmHg. Echo showed EF~25%, no evidence of thrombus, warfarin stopped and  he was transitioned to Plavix. Dig, losartan and spiro stopped due to AKI. NM cardiac CT obtained and showed no evidence of TTR amyloidosis. SPEP and UPEP obtained, results pending. He was discharged back to ALF.  Today he returns for post hospital HF follow up. "I feel like a million bucks". Living at [facility] in [place]. Has a [place] aide who helps with his medications. He is not longer dizzy or has dyspnea with exertion. Denies palpitations, CP, edema, or PND/Orthopnea. Appetite ok. No fever or chills. Weight at home 147 lbs.Taking all medications provided at facility. BP 104/80 on his check at home. He has not needed to take torsemide recently.  ECG (personally reviewed): SR with PVC  Device interrogation: CoreVue looks ok, no VT, < 1 % v-pacing, daily activity 4 hours (personally reviewed).  Labs (8/18): K 3.7, creatinine 1.42 Labs (9/18): digoxin < 0.2 Labs (11/18): K 3.8, creatinine 1.03 Labs (12/18): K 4, creatinine 1.27, digoxin 0.2, hgb 14.2, LDL 51, HDL 49 Labs (03/31/2018): K 3.8 Creatinine 1.1 dig level 0.3  Labs (8/19): K 3.5, creatinine 0.98, BNP 88, digoxin 0.4 Labs (9/19): K 3.9 Creatinine 1.34, LDL 64, TGs 151 Labs (10/19): K 4.2, creatinine 1.29, hgb 12.3 Labs (3/20): K 4.5, creatinine 1.2 Labs (2/22): digoxin 0.9, K 4.8, creatinine 1.28 Labs (6/22): digoxin 0.6, K 4.2, creatinine 1.22 Labs (8/22): K 4.2, creatinine 1.17 Labs (1/23): K 3.9, creatinine 0.91  Review of systems  complete and found to be negative unless listed in HPI.    Past Medical History 1. OSA: Supposed to be using CPAP.  2. HTN 3. CAD: Long history.  - NSTEMI 2007 with BMS D1 and OM2. - STEMI 5/13 with BMS RCA - NSTEMI 1/14 with CTO LCx and PLV, unable to open LCx.  - STEMI 4/18 with LHC showing old TO LCx with collaterals, old TO PLV, 70% ostial D1, totally occluded proximal LAD with some collaterals => DES to LAD.  - LHC 8/18 with old TO LCx, old TO PLV, 60% mLAD, patent LAD stent.  -  LHC 4/19 with totally occluded PLV, totally occluded LCx, patent LAD and D1 stents, 50% mid LAD.  - NSTEMI 10/19. LHC with totally occluded PLV, totally occluded LCx, patent proximal LAD and D1 stents, 80% mid LAD stenosis => DES to mid LAD.  - LHC (9/21): occluded PLV and proximal LCx with collaterals, patent LAD, 60% D2, 60% PDA.  No intervention.  - LHC (1/23): significant 3-vessel disease with patent stents; no further stents were placed 4. Depression 5. Type II DM 6. Hyperlipidemia: History of rhabdomyolysis with statins, he is on Zetia.  7. Chronic systolic CHF: Ischemic cardiomyopathy.  Echo from 9/16 showed improvement in EF to 55-60%.  St Jude ICD.  - Echo (4/18) with EF 20-25%, dyskinetic apex, moderate-severe MR, severe LAE.  - Echo (8/18) with EF 20-25%, moderate to severe MR - Echo (4/19) with EF 20-25%  - Echo (10/19) with EF 20-25%, no LV thrombus.  - RHC (10/19): mean RA 4, PA 22/7, mean PCWP 7, CI 2.3 - RHC (9/21): mean RA 1, PA 18/5, mean PCWP 7, CI 2.12 (F)/2.17(T) - Echo (5/22): EF 25% with mild LV dilation, no LV thrombus, normal RV, mild MR. - RHC (1/23): CO 3.0 and low PWCP at 7 mmHg - Echo (1/23): EF 25% 8. PAD: - ABIs (4/18) with R ABI of 0.84 suggestive of mild disease.  L ABI of 1.27 (Normal flow at rest) - ABIs (5/19): abnormal TBI on right.  - ABIs (10/21): Normal 9. LV thrombus 10. H/o CVA   Current Outpatient Medications  Medication Sig Dispense Refill   acetaminophen (TYLENOL) 325 MG tablet Take 650 mg by mouth every 6 (six) hours as needed for mild pain, fever or headache.      clopidogrel (PLAVIX) 75 MG tablet Take 75 mg by mouth daily.     empagliflozin (JARDIANCE) 10 MG TABS tablet Take 10 mg by mouth daily.     ezetimibe (ZETIA) 10 MG tablet Take 1 tablet (10 mg total) by mouth daily. 30 tablet 3   fenofibrate (TRICOR) 145 MG tablet TAKE ONE TABLET BY MOUTH EVERY DAY. 30 tablet 3   gabapentin (NEURONTIN) 100 MG capsule Take 100 mg by mouth 2 (two)  times daily. As needed     metFORMIN (GLUCOPHAGE) 500 MG tablet Take 1,000 mg by mouth 2 (two) times daily with a meal.     pantoprazole (PROTONIX) 40 MG tablet Take 40 mg by mouth as needed.     REPATHA SURECLICK 341 MG/ML SOAJ INJECT CONTENTS OF ONE PREFILLED PEN SUBCUTANEOUSLY INTO THE SKIN EVERY 14 DAYS. 2 mL 11   torsemide (DEMADEX) 20 MG tablet Take 40 mg by mouth as needed.     nitroGLYCERIN (NITROSTAT) 0.4 MG SL tablet PLACE ONE (1) TABLET UNDER TONGUE EVERY 5 MINUTES UP TO (3) DOSES AS NEEDED FOR CHEST PAIN. IF NO RELIEF, CONTACT MD. (Patient not taking: Reported on  12/08/2021) 25 tablet 3   No current facility-administered medications for this encounter.   Allergies  Allergen Reactions   Contrast Media [Iodinated Contrast Media] Other (See Comments)    Does not eat for religious reasons - okay with using IV heparin   Pork-Derived Products Other (See Comments)    Does not eat for religious reasons - okay with using IV heparin   Social History   Socioeconomic History   Marital status: Single    Spouse name: Not on file   Number of children: 0   Years of education: 9   Highest education level: 9th grade  Occupational History   Not on file  Tobacco Use   Smoking status: Some Days    Packs/day: 0.50    Years: 20.00    Pack years: 10.00    Types: Cigarettes, Cigars    Start date: 07/21/1977   Smokeless tobacco: Never  Vaping Use   Vaping Use: Never used  Substance and Sexual Activity   Alcohol use: Yes    Comment: 09/17/2017 "nothing since 05/2017"   Drug use: Not Currently    Comment: previously incarcerated for drug related offense.   Sexual activity: Not Currently  Other Topics Concern   Not on file  Social History Narrative   Not on file   Social Determinants of Health   Financial Resource Strain: Not on file  Food Insecurity: Not on file  Transportation Needs: Not on file  Physical Activity: Not on file  Stress: Not on file  Social Connections: Not on file   Intimate Partner Violence: Not on file   Family History  Problem Relation Age of Onset   Stroke Mother    Heart attack Mother    Heart attack Father    Stroke Sister    Heart attack Sister    Heart attack Brother    Stroke Brother    Liver disease Neg Hx    Colon cancer Neg Hx    BP (!) 92/58    Pulse 87    Wt 68.9 kg (152 lb)    SpO2 97%    BMI 24.53 kg/m   Wt Readings from Last 3 Encounters:  12/08/21 68.9 kg (152 lb)  11/14/21 67.4 kg (148 lb 9.6 oz)  09/25/21 63 kg (138 lb 12.8 oz)   PHYSICAL EXAM: General:  NAD. No resp difficulty HEENT: Normal Neck: Supple. No JVD. Carotids 2+ bilat; no bruits. No lymphadenopathy or thryomegaly appreciated. Cor: PMI nondisplaced. Regular rate & rhythm. No rubs, gallops or murmurs. Lungs: Clear Abdomen: Soft, nontender, nondistended. No hepatosplenomegaly. No bruits or masses. Good bowel sounds. Extremities: No cyanosis, clubbing, rash, edema Neuro: Alert & oriented x 3, cranial nerves grossly intact. Moves all 4 extremities w/o difficulty. Affect pleasant. + dysarthria.  ASSESSMENT & PLAN: 1. Chronic systolic CHF: Ischemic cardiomyopathy, St Jude ICD. Echo in 10/19 with EF 20-25%.  Echo in 5/22 with EF 25%, normal RV.  Recent admission at [facility] for dehydration and hypotension. Beta blocker and losartan stopped. Admitted again for chest pain. Echo 1/23 showed EF 25%. R/LHC showed no new CAD, preserved CO and low PCWP suggesting over diuresis. Cardiac CT obtained and did not suggest TTR amyloidosis. Much improved NYHA I-II today. He is not volume overloaded on exam or by CoreVue.  - Digoxin, spiro and losartan stopped during recent hospitalization due to AKI. - Off beta blocker as of hospitalization due to low BP. - Continue torsemide 40 mg PRN - No BP room to add  back GDMT (did not tolerate Entresto due to lightheadedness). - Continue Jardiance 10 mg daily. BMET/BNP today. 2. CAD:  Extensive prior history of CAD.  He has known total  occlusion LCx and PLV.  NSTEMI 10/19 with DES to mid LAD.  Coronary angiography in 9/21 showed stable coronary disease, no intervention was done.  Admission 1/23 concerning for ACS, however LHC showed patent stents, no new stents placed. Warfarin was stopped and continued on clopidogrel. Discussed with PharmD personally, no new disease on cath 1/23, will not start ASA today and continue on clopidogrel monotherapy. - Continue clopidogrel.  CBC today. - No statin given history of rhabdomyolysis on statin.  He is now on Repatha and Zetia. Lipids ok 8/22. 3. Diabetes: Taking SGLT2i. 4. Smoking: He occasionally will take a puff on a cigarette. Encouraged complete cessation. 5. PAD: No claudication, foot ulcers. ABIs in 10/21 were normal.    6. LV thrombus:  Echo 1/23 with no evidence of LV thrombus. No longer on Coumadin.  - Discussed with Dr. Aundra Dubin, will obtain echo in 2 months to evaluate evidence of thrombus off Coumadin. 7. 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  - As above, off Coumadin and on clopidogrel. 8. HTN: On the low side today. Check BP at home and log.  Cancel follow up with Dr. Aundra Dubin in 2 weeks.   Follow up with APP in 3-4 weeks (add back GDMT as able) and in 2 months with Dr. Aundra Dubin with repeat echo to evaluate for LV thrombus off Coumadin.   [place], FNP 12/08/21

## 2021-12-08 ENCOUNTER — Ambulatory Visit (HOSPITAL_COMMUNITY)
Admission: RE | Admit: 2021-12-08 | Discharge: 2021-12-08 | Disposition: A | Discharge status: Home / Self Care | Payer: Medicare Other | Source: Ambulatory Visit | Attending: Family Medicine | Admitting: Family Medicine

## 2021-12-08 ENCOUNTER — Other Ambulatory Visit (HOSPITAL_COMMUNITY): Payer: Self-pay | Admitting: Cardiology

## 2021-12-08 ENCOUNTER — Other Ambulatory Visit: Payer: Self-pay

## 2021-12-08 ENCOUNTER — Encounter (HOSPITAL_COMMUNITY): Payer: Self-pay

## 2021-12-08 VITALS — BP 92/58 | HR 87 | Wt 152.0 lb

## 2021-12-08 DIAGNOSIS — F17210 Nicotine dependence, cigarettes, uncomplicated: Secondary | ICD-10-CM | POA: Diagnosis not present

## 2021-12-08 DIAGNOSIS — Z8673 Personal history of transient ischemic attack (TIA), and cerebral infarction without residual deficits: Secondary | ICD-10-CM | POA: Diagnosis not present

## 2021-12-08 DIAGNOSIS — G4733 Obstructive sleep apnea (adult) (pediatric): Secondary | ICD-10-CM | POA: Insufficient documentation

## 2021-12-08 DIAGNOSIS — I739 Peripheral vascular disease, unspecified: Secondary | ICD-10-CM

## 2021-12-08 DIAGNOSIS — I110 Hypertensive heart disease with heart failure: Secondary | ICD-10-CM | POA: Diagnosis not present

## 2021-12-08 DIAGNOSIS — Z7902 Long term (current) use of antithrombotics/antiplatelets: Secondary | ICD-10-CM | POA: Diagnosis not present

## 2021-12-08 DIAGNOSIS — I236 Thrombosis of atrium, auricular appendage, and ventricle as current complications following acute myocardial infarction: Secondary | ICD-10-CM

## 2021-12-08 DIAGNOSIS — I10 Essential (primary) hypertension: Secondary | ICD-10-CM

## 2021-12-08 DIAGNOSIS — I252 Old myocardial infarction: Secondary | ICD-10-CM | POA: Diagnosis not present

## 2021-12-08 DIAGNOSIS — R1084 Generalized abdominal pain: Secondary | ICD-10-CM

## 2021-12-08 DIAGNOSIS — E785 Hyperlipidemia, unspecified: Secondary | ICD-10-CM | POA: Insufficient documentation

## 2021-12-08 DIAGNOSIS — I2510 Atherosclerotic heart disease of native coronary artery without angina pectoris: Secondary | ICD-10-CM | POA: Diagnosis not present

## 2021-12-08 DIAGNOSIS — I255 Ischemic cardiomyopathy: Secondary | ICD-10-CM | POA: Diagnosis not present

## 2021-12-08 DIAGNOSIS — E1159 Type 2 diabetes mellitus with other circulatory complications: Secondary | ICD-10-CM

## 2021-12-08 DIAGNOSIS — E1151 Type 2 diabetes mellitus with diabetic peripheral angiopathy without gangrene: Secondary | ICD-10-CM | POA: Insufficient documentation

## 2021-12-08 DIAGNOSIS — I5022 Chronic systolic (congestive) heart failure: Secondary | ICD-10-CM | POA: Insufficient documentation

## 2021-12-08 DIAGNOSIS — Z955 Presence of coronary angioplasty implant and graft: Secondary | ICD-10-CM | POA: Insufficient documentation

## 2021-12-08 DIAGNOSIS — R072 Precordial pain: Secondary | ICD-10-CM | POA: Diagnosis not present

## 2021-12-08 DIAGNOSIS — Z7984 Long term (current) use of oral hypoglycemic drugs: Secondary | ICD-10-CM | POA: Insufficient documentation

## 2021-12-08 DIAGNOSIS — Z720 Tobacco use: Secondary | ICD-10-CM

## 2021-12-08 DIAGNOSIS — Z79899 Other long term (current) drug therapy: Secondary | ICD-10-CM | POA: Diagnosis not present

## 2021-12-08 LAB — CBC
HCT: 36.5 % — ABNORMAL LOW (ref 39.0–52.0)
Hemoglobin: 11.8 g/dL — ABNORMAL LOW (ref 13.0–17.0)
MCH: 26 pg (ref 26.0–34.0)
MCHC: 32.3 g/dL (ref 30.0–36.0)
MCV: 80.6 fL (ref 80.0–100.0)
Platelets: 288 10*3/uL (ref 150–400)
RBC: 4.53 MIL/uL (ref 4.22–5.81)
RDW: 18.7 % — ABNORMAL HIGH (ref 11.5–15.5)
WBC: 6.8 10*3/uL (ref 4.0–10.5)
nRBC: 0 % (ref 0.0–0.2)

## 2021-12-08 LAB — BASIC METABOLIC PANEL
Anion gap: 8 (ref 5–15)
BUN: 9 mg/dL (ref 8–23)
CO2: 25 mmol/L (ref 22–32)
Calcium: 9.2 mg/dL (ref 8.9–10.3)
Chloride: 108 mmol/L (ref 98–111)
Creatinine, Ser: 1.02 mg/dL (ref 0.61–1.24)
GFR, Estimated: >60 mL/min (ref >60)
Glucose, Bld: 79 mg/dL (ref 70–99)
Potassium: 3.7 mmol/L (ref 3.5–5.1)
Sodium: 141 mmol/L (ref 135–145)

## 2021-12-08 LAB — BRAIN NATRIURETIC PEPTIDE: B Natriuretic Peptide: 280.6 pg/mL — ABNORMAL HIGH (ref 0.0–100.0)

## 2021-12-08 NOTE — Addendum Note (Signed)
Encounter addended by: Rafael Bihari, FNP on: 12/08/2021 2:54 PM  Actions taken: Clinical Note Signed

## 2021-12-08 NOTE — Patient Instructions (Signed)
Labs today We will only contact you if something comes back abnormal or we need to make some changes. Otherwise no news is good news!  Continue to Check your blood pressure and log it.  Please communicate with [place] if BP is running high or low.  Your physician has requested that you have an echocardiogram. Echocardiography is a painless test that uses sound waves to create images of your heart. It provides your doctor with information about the size and shape of your heart and how well your hearts chambers and valves are working. This procedure takes approximately one hour. There are no restrictions for this procedure.  Your physician recommends that you schedule a follow-up appointment in: 4 weeks with APP clinic and 2 months with Dr Aundra Dubin and an ECHO.  Please call office at [phone] option [phone] if you have any questions or concerns.   Do the following things EVERYDAY: Weigh yourself in the morning before breakfast. Write it down and keep it in a log. Take your medicines as prescribed Eat low salt foods--Limit salt (sodium) to 2000 mg per day.  Stay as active as you can everyday Limit all fluids for the day to less than 2 liters  At [facility], you and your health needs are our priority. As part of our continuing mission to provide you with exceptional heart care, we have created designated Provider Care Teams. These Care Teams include your primary Cardiologist (physician) and Advanced Practice Providers (APPs- Physician Assistants and Nurse Practitioners) who all work together to provide you with the care you need, when you need it.   You may see any of the following providers on your designated Care Team at your next follow up: Dr Glori Bickers Dr Haynes Kerns, NP Lyda Jester, [place] [facility] [place], [place] Audry Riles, PharmD   Please be sure to bring in all your medications bottles to every appointment.

## 2021-12-18 ENCOUNTER — Ambulatory Visit (INDEPENDENT_AMBULATORY_CARE_PROVIDER_SITE_OTHER): Payer: Medicare Other

## 2021-12-18 DIAGNOSIS — I5022 Chronic systolic (congestive) heart failure: Secondary | ICD-10-CM

## 2021-12-18 DIAGNOSIS — Z95810 Presence of automatic (implantable) cardiac defibrillator: Secondary | ICD-10-CM

## 2021-12-18 NOTE — Progress Notes (Signed)
EPIC Encounter for ICM Monitoring  Patient Name: Daniel Li is a 66 y.o. male Date: 12/18/2021 Primary Care Physican: [name], NP Primary Cardiologist: [facility] Electrophysiologist: Allred 11/14/2021 Office Weight: 148 lbs                                                  Transmission reviewed.      CorVue thoracic impedance suggesting normal fluid levels.   Prescribed dosage:  Torsemide 20 mg Take 2 tablets (40 mg total) by mouth daily as needed.   Labs: 12/05/2021 Creatinine 1.02, BUN 9,   Potassium 3.7, Sodium 141 11/24/2021 Creatinine 0.87, BUN 8,   Potassium 4.1, Sodium 143 Care Everywhere 11/23/2021 Creatinine 0.99, BUN 12, Potassium 4.1, Sodium 145 Care Everywhere 11/22/2021 Creatinine 1.06, BUN 13, Potassium 4.2, Sodium 143 Care Everywhere 11/20/2020 Creatinine 1.39, BUN 18, Potassium 4.0, Sodium 139 Care Everywhere A complete set of results can be found in Results Review.   Recommendations:  No changes   Follow-up plan: ICM clinic phone appointment on 01/01/2022.   91 day device clinic remote transmission 02/12/2022.     EP/Cardiology Office Visits: 01/05/2022 with HF clinic.  02/19/2022 with Dr Aundra Dubin.  03/19/2022 with Dr Rayann Heman.   Copy of ICM check sent to Dr. Rayann Heman.    3 month ICM trend: 12/18/2021.    12-14 Month ICM trend:     Rosalene Billings, RN 12/18/2021 1:00 PM

## 2021-12-26 ENCOUNTER — Inpatient Hospital Stay
Admission: AD | Admit: 2021-12-26 | Payer: Medicare Other | Source: Other Acute Inpatient Hospital | Admitting: Cardiology

## 2021-12-26 ENCOUNTER — Other Ambulatory Visit (HOSPITAL_COMMUNITY): Payer: Medicare Other

## 2021-12-26 ENCOUNTER — Encounter (HOSPITAL_COMMUNITY): Payer: Medicare Other | Admitting: Cardiology

## 2022-01-01 ENCOUNTER — Ambulatory Visit (INDEPENDENT_AMBULATORY_CARE_PROVIDER_SITE_OTHER): Payer: Medicare Other

## 2022-01-01 DIAGNOSIS — I5022 Chronic systolic (congestive) heart failure: Secondary | ICD-10-CM | POA: Diagnosis not present

## 2022-01-01 DIAGNOSIS — Z95810 Presence of automatic (implantable) cardiac defibrillator: Secondary | ICD-10-CM

## 2022-01-03 NOTE — Progress Notes (Signed)
EPIC Encounter for ICM Monitoring ? ?Patient Name: Daniel Li is a 65 y.o. male ?Date: 01/03/2022 ?Primary Care Physican: [name], NP ?Primary Cardiologist: [facility] ?Electrophysiologist: Allred ?11/14/2021 Office Weight: 148 lbs                                                ?  ?Transmission reviewed.  3 ED visits in the last month for chest pain.  ?  ?CorVue thoracic impedance normal but was suggesting possible fluid accumulation during ED visits.  ?  ?Prescribed dosage:  ?Torsemide 20 mg Take 1 tablet (20 mg total) by mouth daily as needed (For weight gain. Take one tablet if you gain 3 lbs in one day or 5 lbs in one week.) (changed from daily to PRN at 1/25 hospital discharge) ?Potassium 20 mEq take 1 tablet daily. ?Spironolactone stopped at 1/25 hospital discharge ?  ?Labs: ?11/24/2021 Creatinine 0.87, BUN 8,   Potassium 4.1, Sodium 143 Care Everywhere ?11/23/2021 Creatinine 0.99, BUN 12, Potassium 4.1, Sodium 145 Care Everywhere ?11/22/2021 Creatinine 1.06, BUN 13, Potassium 4.2, Sodium 143 Care Everywhere ?11/20/2020 Creatinine 1.39, BUN 18, Potassium 4.0, Sodium 139 Care Everywhere ?A complete set of results can be found in Results Review. ?  ?Recommendations:  No changes ?  ?Follow-up plan: ICM clinic phone appointment on 02/05/2022.    91 day device clinic remote transmission 02/12/2022.   ?  ?EP/Cardiology Office Visits: 01/05/2022 with HF clinic.  02/19/2022 with Dr Aundra Dubin.  03/19/2022 with Dr Rayann Heman. ?  ?Copy of ICM check sent to Dr. Rayann Heman.  ? ?3 month ICM trend: 01/01/2022. ? ? ? ?12-14 Month ICM trend:  ? ? ? ?Rosalene Billings, RN ?01/03/2022 ?3:30 PM ? ?

## 2022-01-03 NOTE — Progress Notes (Signed)
Advanced Heart Failure Clinic Note  PCP: [name], NP Primary Cardiologist: Dr. Harl Bowie HF Cardiology: Dr. Aundra Dubin   HPI: Daniel Li is a 66 y.o. male with long history of CAD s/p multiple ACS episodes, HTN, HLD, tobacco abuse, DM, OSA, CVA and systolic CHF.   He has a history of CAD with NSTEMI in 2007 with BMS placement to his D1 and OM2. Also with history of STEMI in May 2013 at which time he had a BMS placed to his RCA. NSTEMI in January of 2014, cath showed CTO of left circumflex, unable to intervene upon. EF 55-60% in September 2016. Anterior STEMI 4/18, DES to pLAD.   Admitted 8/9 - 06/11/17 with chest pain. Cath as below. No targets for PCI, LAD stent patent. EF remained low at 20-25% as below. Now with St Jude ICD.   Admitted 4/16 - 02/17/2018 with NSTEMI. Underwent LHC again with unchanged anatomy, no intervention. Discharged back to assisted living.   He was admitted again in 10/19 with NSTEMI.  LHC showed totally occluded PLV, 75% PDA stenosis, 80% mid LAD, occluded LCx.  He had DES to mLAD. Repeat echo showed EF 20-25%.    RHC/LHC in 9/21 showed unchanged coronaries with occluded PLV and proximal LCx with collaterals, patent LAD, 60% D2, 60% PDA. Normal filling pressures and relatively preserved cardiac output.   Echo in 5/22 showed EF 25% with mild LV dilation, no LV thrombus, normal RV, mild MR.   Follow up 11/22 he was more dizzy. BP checks ~ 100/90s. BP low at visit (78/58), ReDs 32%.  Hydral/Imdur stopped and losartan decreased to 12.5.  Admitted 11/05/21 at [facility] for hypotension and AKI. Echo ordered but not completed. Received IVF and beta blocker and losartan stopped at discharge. He was discharged back to facility.  Admitted 11/20/21 to [facility] with CP, concern for ACS. Underwent R/LHC showing significant 3-vessel disease with patent stents; no further stents were placed; CO 3.0 and low PWCP at 7 mmHg. Echo showed EF~25%, no evidence of thrombus, warfarin stopped and  he was transitioned to Plavix. Dig, losartan and spiro stopped due to AKI. NM cardiac CT obtained and showed no evidence of TTR amyloidosis. SPEP and UPEP obtained, results pending. He was discharged back to ALF.  Admitted to [facility] 2/23 with CP and fall. Ethanol on admission 184. Troponin elevated but felt to be in setting of ETOH use and fall.   Today he returns for HF follow up. Overall feeling fine. No dyspnea walking on flat ground with [place] cane, knee OA prevents him from going up stairs. Lives at [address] [facility] in [place] and has a [facility] aide that helps with meds. Denies palpitations, CP, dizziness, edema, or PND/Orthopnea. Appetite ok. No fever or chills. Weight at home 149.8 pounds. Has not had any further ETOH in 2 weeks. Smokes ~ 1 cig/day.  Device interrogation: CoreVue showed recent volume up but now back to  baseline, no VT, VP < 1 %(personally reviewed).  Labs (8/18): K 3.7, creatinine 1.42 Labs (9/18): digoxin < 0.2 Labs (11/18): K 3.8, creatinine 1.03 Labs (12/18): K 4, creatinine 1.27, digoxin 0.2, hgb 14.2, LDL 51, HDL 49 Labs (03/31/2018): K 3.8 Creatinine 1.1 dig level 0.3  Labs (8/19): K 3.5, creatinine 0.98, BNP 88, digoxin 0.4 Labs (9/19): K 3.9 Creatinine 1.34, LDL 64, TGs 151 Labs (10/19): K 4.2, creatinine 1.29, hgb 12.3 Labs (3/20): K 4.5, creatinine 1.2 Labs (2/22): digoxin 0.9, K 4.8, creatinine 1.28 Labs (6/22): digoxin 0.6, K  4.2, creatinine 1.22 Labs (8/22): K 4.2, creatinine 1.17 Labs (1/23): K 3.9, creatinine 0.91 Labs (3/23): K 3.4, creatinine 1.10  Review of systems complete and found to be negative unless listed in HPI.    Past Medical History 1. OSA: Supposed to be using CPAP.  2. HTN 3. CAD: Long history.  - NSTEMI 2007 with BMS D1 and OM2. - STEMI 5/13 with BMS RCA - NSTEMI 1/14 with CTO LCx and PLV, unable to open LCx.  - STEMI 4/18 with LHC showing old TO LCx with collaterals, old TO PLV, 70% ostial D1, totally occluded proximal LAD with  some collaterals => DES to LAD.  - LHC 8/18 with old TO LCx, old TO PLV, 60% mLAD, patent LAD stent.  - LHC 4/19 with totally occluded PLV, totally occluded LCx, patent LAD and D1 stents, 50% mid LAD.  - NSTEMI 10/19. LHC with totally occluded PLV, totally occluded LCx, patent proximal LAD and D1 stents, 80% mid LAD stenosis => DES to mid LAD.  - LHC (9/21): occluded PLV and proximal LCx with collaterals, patent LAD, 60% D2, 60% PDA.  No intervention.  - LHC (1/23): significant 3-vessel disease with patent stents; no further stents were placed 4. Depression 5. Type II DM 6. Hyperlipidemia: History of rhabdomyolysis with statins, he is on Zetia.  7. Chronic systolic CHF: Ischemic cardiomyopathy.  Echo from 9/16 showed improvement in EF to 55-60%.  St Jude ICD.  - Echo (4/18) with EF 20-25%, dyskinetic apex, moderate-severe MR, severe LAE.  - Echo (8/18) with EF 20-25%, moderate to severe MR - Echo (4/19) with EF 20-25%  - Echo (10/19) with EF 20-25%, no LV thrombus.  - RHC (10/19): mean RA 4, PA 22/7, mean PCWP 7, CI 2.3 - RHC (9/21): mean RA 1, PA 18/5, mean PCWP 7, CI 2.12 (F)/2.17(T) - Echo (5/22): EF 25% with mild LV dilation, no LV thrombus, normal RV, mild MR. - RHC (1/23): CO 3.0 and low PWCP at 7 mmHg - Echo (1/23): EF 25% 8. PAD: - ABIs (4/18) with R ABI of 0.84 suggestive of mild disease.  L ABI of 1.27 (Normal flow at rest) - ABIs (5/19): abnormal TBI on right.  - ABIs (10/21): Normal 9. LV thrombus 10. H/o CVA   Current Outpatient Medications  Medication Sig Dispense Refill   acetaminophen (TYLENOL) 325 MG tablet Take 650 mg by mouth every 6 (six) hours as needed for mild pain, fever or headache.      clopidogrel (PLAVIX) 75 MG tablet Take 75 mg by mouth daily.     empagliflozin (JARDIANCE) 10 MG TABS tablet Take 10 mg by mouth daily.     ezetimibe (ZETIA) 10 MG tablet Take 1 tablet (10 mg total) by mouth daily. 30 tablet 3   fenofibrate (TRICOR) 145 MG tablet TAKE ONE  TABLET BY MOUTH EVERY DAY. 30 tablet 11   gabapentin (NEURONTIN) 100 MG capsule Take 100 mg by mouth 2 (two) times daily. As needed     metFORMIN (GLUCOPHAGE) 500 MG tablet Take 1,000 mg by mouth 2 (two) times daily with a meal.     nitroGLYCERIN (NITROSTAT) 0.4 MG SL tablet PLACE ONE (1) TABLET UNDER TONGUE EVERY 5 MINUTES UP TO (3) DOSES AS NEEDED FOR CHEST PAIN. IF NO RELIEF, CONTACT MD. 25 tablet 3   pantoprazole (PROTONIX) 40 MG tablet Take 40 mg by mouth as needed.     REPATHA SURECLICK 256 MG/ML SOAJ INJECT CONTENTS OF ONE PREFILLED PEN SUBCUTANEOUSLY INTO THE  SKIN EVERY 14 DAYS. 2 mL 11   torsemide (DEMADEX) 20 MG tablet Take 40 mg by mouth as needed.     No current facility-administered medications for this encounter.   Allergies  Allergen Reactions   Contrast Media [Iodinated Contrast Media] Other (See Comments)    Does not eat for religious reasons - okay with using IV heparin   Pork-Derived Products Other (See Comments)    Does not eat for religious reasons - okay with using IV heparin   Social History   Socioeconomic History   Marital status: Single    Spouse name: Not on file   Number of children: 0   Years of education: 9   Highest education level: 9th grade  Occupational History   Not on file  Tobacco Use   Smoking status: Some Days    Packs/day: 0.50    Years: 20.00    Pack years: 10.00    Types: Cigarettes, Cigars    Start date: 07/21/1977   Smokeless tobacco: Never  Vaping Use   Vaping Use: Never used  Substance and Sexual Activity   Alcohol use: Yes    Comment: 09/17/2017 "nothing since 05/2017"   Drug use: Not Currently    Comment: previously incarcerated for drug related offense.   Sexual activity: Not Currently  Other Topics Concern   Not on file  Social History Narrative   Not on file   Social Determinants of Health   Financial Resource Strain: Not on file  Food Insecurity: Not on file  Transportation Needs: Not on file  Physical Activity:  Not on file  Stress: Not on file  Social Connections: Not on file  Intimate Partner Violence: Not on file   Family History  Problem Relation Age of Onset   Stroke Mother    Heart attack Mother    Heart attack Father    Stroke Sister    Heart attack Sister    Heart attack Brother    Stroke Brother    Liver disease Neg Hx    Colon cancer Neg Hx    BP 112/78    Pulse 89    Wt 71.6 kg (157 lb 12.8 oz)    SpO2 96%    BMI 25.47 kg/m   Wt Readings from Last 3 Encounters:  01/05/22 71.6 kg (157 lb 12.8 oz)  12/08/21 68.9 kg (152 lb)  11/14/21 67.4 kg (148 lb 9.6 oz)   PHYSICAL EXAM: General:  NAD. No resp difficulty HEENT: Normal Neck: Supple. No JVD. Carotids 2+ bilat; no bruits. No lymphadenopathy or thryomegaly appreciated. Cor: PMI nondisplaced. Regular rate & rhythm. No rubs, gallops or murmurs. Lungs: Clear Abdomen: Soft, nontender, nondistended. No hepatosplenomegaly. No bruits or masses. Good bowel sounds. Extremities: No cyanosis, clubbing, rash, edema Neuro: Alert & oriented x 3, cranial nerves grossly intact. Moves all 4 extremities w/o difficulty. Affect pleasant. + dysarthria  ASSESSMENT & PLAN: 1. Chronic systolic CHF: Ischemic cardiomyopathy, St Jude ICD. Echo in 10/19 with EF 20-25%.  Echo in 5/22 with EF 25%, normal RV.  Recent admission at [facility] for dehydration and hypotension. Beta blocker and losartan stopped. Admitted again for chest pain. Echo 1/23 showed EF 25%. R/LHC showed no new CAD, preserved CO and low PCWP suggesting over diuresis. Cardiac CT obtained and did not suggest TTR amyloidosis. NYHA II today. He is not volume overloaded on exam or by CoreVue.  - Digoxin, beta blocker, spiro and losartan stopped during recent hospitalization due to AKI and low BP. -  Restart spiro 12.5 mg daily. SCr 1.1 and K 3.4 on 12/29/21 labs.[name] Kitchen BMET in 1 week. - Continue Jardiance 10 mg daily.  - Continue torsemide 40 mg PRN. Has not needed recently. - Add back losartan and beta  blocker next (did not tolerate Entresto due to lightheadedness). He is very hesitant to add any further medications. Discussed rationale for each med. - Would avoid digoxin with ETOH use and recent AKIs. 2. CAD:  Extensive prior history of CAD.  He has known total occlusion LCx and PLV.  NSTEMI 10/19 with DES to mid LAD.  Coronary angiography in 9/21 showed stable coronary disease, no intervention was done.  Admission 1/23 concerning for ACS, however LHC showed patent stents, no new stents placed. Warfarin was stopped and continued on clopidogrel. Discussed with PharmD personally, no new disease on cath 1/23, will not start ASA today and continue on clopidogrel monotherapy. - Continue clopidogrel.   - No statin given history of rhabdomyolysis on statin.  He is now on Repatha and Zetia. Lipids ok 8/22. 3. Diabetes: Taking SGLT2i. 4. Smoking: He occasionally will take a puff on a cigarette. Encouraged complete cessation. 5. PAD: No claudication, foot ulcers. ABIs in 10/21 were normal.    6. LV thrombus:  Echo 1/23 with no evidence of LV thrombus. No longer on Coumadin.  - Discussed with Dr. Aundra Dubin, will obtain echo in 2 months to evaluate evidence of thrombus off Coumadin. 7. 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  - As above, off Coumadin and on clopidogrel. 8. HTN: Stable today.  - Add back GDMT as able. 9. ETOH use: Ethanol 184 on recent admission. Advised complete cessation.  Follow up with Dr. Aundra Dubin with repeat echo to evaluate for LV thrombus off Coumadin in 2 months as scheduled.   [place], FNP 01/05/22

## 2022-01-05 ENCOUNTER — Other Ambulatory Visit: Payer: Self-pay

## 2022-01-05 ENCOUNTER — Encounter (HOSPITAL_COMMUNITY): Payer: Self-pay

## 2022-01-05 ENCOUNTER — Ambulatory Visit (HOSPITAL_COMMUNITY)
Admission: RE | Admit: 2022-01-05 | Discharge: 2022-01-05 | Disposition: A | Discharge status: Home / Self Care | Payer: Medicare Other | Source: Ambulatory Visit | Attending: Family Medicine | Admitting: Family Medicine

## 2022-01-05 VITALS — BP 112/78 | HR 89 | Wt 157.8 lb

## 2022-01-05 DIAGNOSIS — Z09 Encounter for follow-up examination after completed treatment for conditions other than malignant neoplasm: Secondary | ICD-10-CM | POA: Diagnosis not present

## 2022-01-05 DIAGNOSIS — I236 Thrombosis of atrium, auricular appendage, and ventricle as current complications following acute myocardial infarction: Secondary | ICD-10-CM

## 2022-01-05 DIAGNOSIS — I110 Hypertensive heart disease with heart failure: Secondary | ICD-10-CM | POA: Diagnosis not present

## 2022-01-05 DIAGNOSIS — I739 Peripheral vascular disease, unspecified: Secondary | ICD-10-CM

## 2022-01-05 DIAGNOSIS — Z7902 Long term (current) use of antithrombotics/antiplatelets: Secondary | ICD-10-CM | POA: Insufficient documentation

## 2022-01-05 DIAGNOSIS — Z79899 Other long term (current) drug therapy: Secondary | ICD-10-CM | POA: Insufficient documentation

## 2022-01-05 DIAGNOSIS — F1011 Alcohol abuse, in remission: Secondary | ICD-10-CM

## 2022-01-05 DIAGNOSIS — I255 Ischemic cardiomyopathy: Secondary | ICD-10-CM | POA: Diagnosis not present

## 2022-01-05 DIAGNOSIS — E119 Type 2 diabetes mellitus without complications: Secondary | ICD-10-CM | POA: Diagnosis not present

## 2022-01-05 DIAGNOSIS — I5042 Chronic combined systolic (congestive) and diastolic (congestive) heart failure: Secondary | ICD-10-CM

## 2022-01-05 DIAGNOSIS — Z7984 Long term (current) use of oral hypoglycemic drugs: Secondary | ICD-10-CM | POA: Insufficient documentation

## 2022-01-05 DIAGNOSIS — I10 Essential (primary) hypertension: Secondary | ICD-10-CM

## 2022-01-05 DIAGNOSIS — I5022 Chronic systolic (congestive) heart failure: Secondary | ICD-10-CM

## 2022-01-05 DIAGNOSIS — E1159 Type 2 diabetes mellitus with other circulatory complications: Secondary | ICD-10-CM | POA: Diagnosis not present

## 2022-01-05 DIAGNOSIS — F17210 Nicotine dependence, cigarettes, uncomplicated: Secondary | ICD-10-CM | POA: Diagnosis not present

## 2022-01-05 DIAGNOSIS — Z720 Tobacco use: Secondary | ICD-10-CM

## 2022-01-05 DIAGNOSIS — I2510 Atherosclerotic heart disease of native coronary artery without angina pectoris: Secondary | ICD-10-CM

## 2022-01-05 DIAGNOSIS — Z8673 Personal history of transient ischemic attack (TIA), and cerebral infarction without residual deficits: Secondary | ICD-10-CM

## 2022-01-05 DIAGNOSIS — I252 Old myocardial infarction: Secondary | ICD-10-CM | POA: Diagnosis not present

## 2022-01-05 MED ORDER — SPIRONOLACTONE 25 MG PO TABS: 12.5000 mg | ORAL_TABLET | Freq: Every day | ORAL | 6 refills | Status: DC

## 2022-01-05 NOTE — Patient Instructions (Signed)
Restart Spironolactone 12.5 mg (1/2 tab) Daily ? ?Your physician recommends that you return for lab work in: 1-2 weeks, we have provided you with a prescription to have this done locally ? ?Your physician recommends that you schedule a follow-up appointment in: AS Scheduled on  ? ? Monday 02/19/22 at 9:05 AM for echocardiogram and 10:20 AM with Dr Aundra Dubin ? ?Do the following things EVERYDAY: ?Weigh yourself in the morning before breakfast. Write it down and keep it in a log. ?Take your medicines as prescribed ?Eat low salt foods--Limit salt (sodium) to 2000 mg per day.  ?Stay as active as you can everyday ?Limit all fluids for the day to less than 2 liters ? ?If you have any questions or concerns before your next appointment please send [place] a message through [place] or call our office at [phone].   ? ?TO LEAVE A MESSAGE FOR THE NURSE SELECT OPTION 2, PLEASE LEAVE A MESSAGE INCLUDING: ?YOUR NAME ?DATE OF BIRTH ?CALL BACK NUMBER ?REASON FOR CALL**this is important as we prioritize the call backs ? ?YOU WILL RECEIVE A CALL BACK THE SAME DAY AS LONG AS YOU CALL BEFORE 4:00 PM ? ?At the [facility], you and your health needs are our priority. As part of our continuing mission to provide you with exceptional heart care, we have created designated Provider Care Teams. These Care Teams include your primary Cardiologist (physician) and Advanced Practice Providers (APPs- Physician Assistants and Nurse Practitioners) who all work together to provide you with the care you need, when you need it.  ? ?You may see any of the following providers on your designated Care Team at your next follow up: ?Dr Glori Bickers ?Dr [name] ?Darrick Grinder, NP ?Lyda Jester, PA ?Jessica Milford,NP ?Marlyce Huge, PA ?Audry Riles, PharmD ? ? ?Please be sure to bring in all your medications bottles to every appointment.  ? ?

## 2022-01-10 ENCOUNTER — Other Ambulatory Visit: Payer: Self-pay | Admitting: Internal Medicine

## 2022-02-05 ENCOUNTER — Other Ambulatory Visit (HOSPITAL_COMMUNITY): Payer: Self-pay | Admitting: Cardiology

## 2022-02-05 ENCOUNTER — Ambulatory Visit (INDEPENDENT_AMBULATORY_CARE_PROVIDER_SITE_OTHER): Payer: Medicare Other

## 2022-02-05 DIAGNOSIS — I2510 Atherosclerotic heart disease of native coronary artery without angina pectoris: Secondary | ICD-10-CM

## 2022-02-05 DIAGNOSIS — Z95810 Presence of automatic (implantable) cardiac defibrillator: Secondary | ICD-10-CM | POA: Diagnosis not present

## 2022-02-05 DIAGNOSIS — I213 ST elevation (STEMI) myocardial infarction of unspecified site: Secondary | ICD-10-CM

## 2022-02-05 DIAGNOSIS — I5022 Chronic systolic (congestive) heart failure: Secondary | ICD-10-CM | POA: Diagnosis not present

## 2022-02-06 NOTE — Progress Notes (Signed)
EPIC Encounter for ICM Monitoring ? ?Patient Name: Daniel Li is a 66 y.o. male ?Date: 02/06/2022 ?Primary Care Physican: [name], NP ?Primary Cardiologist: [facility] ?Electrophysiologist: [place] ?11/14/2021 Office Weight: 148 lbs                                                ?  ?Transmission reviewed.    ?  ?CorVue thoracic impedance suggesting normal fluid levels.  ?  ?Prescribed dosage:  ?Torsemide 20 mg Take 40 mg by mouth daily as needed  ?Spironolactone 25 mg take 0.5 tablet (12.5 mg total) daily ?  ?Labs: ?12/29/2021 Creatinine 1.10, BUN 14, Potassium 3.4, Sodium 145 Care Everywhere ?12/28/2021 Creatinine 0.96, BUN 10, Potassium 4.0, Sodium 145 Care Everywhere ?12/26/2021 Creatinine 1.07, BUN 9,   Potassium 4.0, Sodium 145 Care Everywhere ?A complete set of results can be found in Results Review. ?  ?Recommendations:  No changes ?  ?Follow-up plan: ICM clinic phone appointment on 03/12/2022.    91 day device clinic remote transmission 02/12/2022.   ?  ?EP/Cardiology Office Visits:   02/19/2022 with Dr Aundra Dubin.  03/19/2022 with Dr Rayann Heman. ?  ?Copy of ICM check sent to Dr. Rayann Heman.  ? ?3 month ICM trend: 02/05/2022. ? ? ? ?12-14 Month ICM trend:  ? ? ? ?Rosalene Billings, RN ?02/06/2022 ?4:48 PM ? ?

## 2022-02-12 ENCOUNTER — Ambulatory Visit (INDEPENDENT_AMBULATORY_CARE_PROVIDER_SITE_OTHER): Payer: Medicare Other

## 2022-02-12 DIAGNOSIS — I5022 Chronic systolic (congestive) heart failure: Secondary | ICD-10-CM

## 2022-02-12 DIAGNOSIS — I255 Ischemic cardiomyopathy: Secondary | ICD-10-CM

## 2022-02-13 LAB — CUP PACEART REMOTE DEVICE CHECK
Battery Remaining Longevity: 64 mo
Battery Remaining Percentage: 61 %
Battery Voltage: 2.98 V
Brady Statistic RV Percent Paced: 1 %
Date Time Interrogation Session: 20230417020016
HighPow Impedance: 50 Ohm
HighPow Impedance: 50 Ohm
Implantable Lead Implant Date: 20181120
Implantable Lead Location: 753860
Implantable Pulse Generator Implant Date: 20181120
Lead Channel Impedance Value: 300 Ohm
Lead Channel Pacing Threshold Amplitude: 0.75 V
Lead Channel Pacing Threshold Pulse Width: 0.5 ms
Lead Channel Sensing Intrinsic Amplitude: 9.5 mV
Lead Channel Setting Pacing Amplitude: 2.5 V
Lead Channel Setting Pacing Pulse Width: 0.5 ms
Lead Channel Setting Sensing Sensitivity: 0.5 mV
Pulse Gen Serial Number: 9786940

## 2022-02-19 ENCOUNTER — Ambulatory Visit (HOSPITAL_BASED_OUTPATIENT_CLINIC_OR_DEPARTMENT_OTHER)
Admission: RE | Admit: 2022-02-19 | Discharge: 2022-02-19 | Disposition: A | Discharge status: Home / Self Care | Payer: Medicare Other | Source: Ambulatory Visit | Attending: Cardiology | Admitting: Cardiology

## 2022-02-19 ENCOUNTER — Ambulatory Visit (HOSPITAL_COMMUNITY)
Admission: RE | Admit: 2022-02-19 | Discharge: 2022-02-19 | Disposition: A | Discharge status: Home / Self Care | Payer: Medicare Other | Source: Ambulatory Visit | Attending: Family Medicine | Admitting: Family Medicine

## 2022-02-19 ENCOUNTER — Other Ambulatory Visit (HOSPITAL_COMMUNITY): Payer: Self-pay

## 2022-02-19 VITALS — BP 98/60 | HR 78 | Wt 153.1 lb

## 2022-02-19 DIAGNOSIS — G4733 Obstructive sleep apnea (adult) (pediatric): Secondary | ICD-10-CM | POA: Diagnosis not present

## 2022-02-19 DIAGNOSIS — Z7984 Long term (current) use of oral hypoglycemic drugs: Secondary | ICD-10-CM | POA: Insufficient documentation

## 2022-02-19 DIAGNOSIS — I255 Ischemic cardiomyopathy: Secondary | ICD-10-CM | POA: Insufficient documentation

## 2022-02-19 DIAGNOSIS — I5022 Chronic systolic (congestive) heart failure: Secondary | ICD-10-CM

## 2022-02-19 DIAGNOSIS — Z7902 Long term (current) use of antithrombotics/antiplatelets: Secondary | ICD-10-CM | POA: Insufficient documentation

## 2022-02-19 DIAGNOSIS — Z87891 Personal history of nicotine dependence: Secondary | ICD-10-CM | POA: Insufficient documentation

## 2022-02-19 DIAGNOSIS — Z79899 Other long term (current) drug therapy: Secondary | ICD-10-CM | POA: Diagnosis not present

## 2022-02-19 DIAGNOSIS — E785 Hyperlipidemia, unspecified: Secondary | ICD-10-CM | POA: Insufficient documentation

## 2022-02-19 DIAGNOSIS — I5042 Chronic combined systolic (congestive) and diastolic (congestive) heart failure: Secondary | ICD-10-CM | POA: Diagnosis present

## 2022-02-19 DIAGNOSIS — I2510 Atherosclerotic heart disease of native coronary artery without angina pectoris: Secondary | ICD-10-CM | POA: Diagnosis not present

## 2022-02-19 DIAGNOSIS — I252 Old myocardial infarction: Secondary | ICD-10-CM | POA: Diagnosis not present

## 2022-02-19 DIAGNOSIS — I110 Hypertensive heart disease with heart failure: Secondary | ICD-10-CM | POA: Diagnosis not present

## 2022-02-19 DIAGNOSIS — Z8673 Personal history of transient ischemic attack (TIA), and cerebral infarction without residual deficits: Secondary | ICD-10-CM | POA: Diagnosis not present

## 2022-02-19 DIAGNOSIS — E119 Type 2 diabetes mellitus without complications: Secondary | ICD-10-CM | POA: Insufficient documentation

## 2022-02-19 DIAGNOSIS — I340 Nonrheumatic mitral (valve) insufficiency: Secondary | ICD-10-CM | POA: Insufficient documentation

## 2022-02-19 LAB — BASIC METABOLIC PANEL
Anion gap: 6 (ref 5–15)
BUN: 11 mg/dL (ref 8–23)
CO2: 27 mmol/L (ref 22–32)
Calcium: 9.1 mg/dL (ref 8.9–10.3)
Chloride: 108 mmol/L (ref 98–111)
Creatinine, Ser: 1 mg/dL (ref 0.61–1.24)
GFR, Estimated: >60 mL/min (ref >60)
Glucose, Bld: 96 mg/dL (ref 70–99)
Potassium: 3.9 mmol/L (ref 3.5–5.1)
Sodium: 141 mmol/L (ref 135–145)

## 2022-02-19 LAB — ECHOCARDIOGRAM COMPLETE
Area-P 1/2: 5.88 cm2
Calc EF: 20 %
MV M vel: 4.43 m/s
MV Peak grad: 78.5 mmHg
Radius: 0.9 cm
S' Lateral: 5.2 cm
Single Plane A2C EF: 21.6 %
Single Plane A4C EF: 20.3 %

## 2022-02-19 LAB — BRAIN NATRIURETIC PEPTIDE: B Natriuretic Peptide: 542.9 pg/mL — ABNORMAL HIGH (ref 0.0–100.0)

## 2022-02-19 MED ORDER — PERFLUTREN LIPID MICROSPHERE
1.0000 mL | INTRAVENOUS | Status: DC | PRN
  Administered 2022-02-19: 2 mL via INTRAVENOUS
  Filled 2022-02-19: qty 10

## 2022-02-19 MED ORDER — POTASSIUM CHLORIDE ER 10 MEQ PO TBCR: 10.0000 meq | EXTENDED_RELEASE_TABLET | ORAL | 5 refills | Status: DC

## 2022-02-19 MED ORDER — SODIUM CHLORIDE 0.9% FLUSH: 3.0000 mL | Freq: Two times a day (BID) | INTRAVENOUS | Status: DC

## 2022-02-19 MED ORDER — TORSEMIDE 20 MG PO TABS: 20.0000 mg | ORAL_TABLET | ORAL | 5 refills | Status: DC

## 2022-02-19 MED ORDER — CARVEDILOL 3.125 MG PO TABS: 3.1250 mg | ORAL_TABLET | Freq: Two times a day (BID) | ORAL | 11 refills | Status: DC

## 2022-02-19 NOTE — Patient Instructions (Signed)
Medication Changes: ? ?Start Carvedilol 3.'125mg'$  Twice daily ? ?Torsemide '20mg'$  every other day ? ?Potassium 67mq every other day  ? ?Lab Work: ? ?Labs done today, your results will be available in MyChart, we will contact you for abnormal readings. ? ? ?Testing/Procedures: ? ?You are scheduled for a Cardiac Catheterization on Wednesday, May 3 with Dr. DLoralie Champagne ? ?1. [facility] [address] [place] [id number] 8:00 AM (This time is two hours before your procedure to ensure your preparation). Free valet parking service is available.  ? ?Special note: Every effort is made to have your procedure done on time. Please understand that emergencies sometimes delay scheduled procedures. ? ?2. Diet: Do not eat solid foods after midnight.  You may have clear liquids until 5 AM upon the day of the procedure. ? ?3. Labs: completed today 02/19/2022 ? ?4. Medication instructions in preparation for your procedure: ? ? Contrast Allergy: No ? ?Stop taking, Spironolactone and Torsemide  Wednesday, May 3, ? ? ?Do not take Diabetes Med Glucophage (Metformin) on the day of the procedure and HOLD 48 HOURS AFTER THE PROCEDURE. ? ?On the morning of your procedure, take Plavix/Clopidogrel and any morning medicines NOT listed above.  You may use sips of water. ? ?5. Plan to go home the same day, you will only stay overnight if medically necessary. ?6. You MUST have a responsible adult to drive you home. ?7. An adult MUST be with you the first 24 hours after you arrive home. ?8. Bring a current list of your medications, and the last time and date medication taken. ?9. Bring ID and current insurance cards. ?10.Please wear clothes that are easy to get on and off and wear slip-on shoes. ? ? ?You are scheduled for a TEE/Cardioversion/TEE Cardioversion on May 3rd  with Dr. MAundra Dubin   ? ?DIET: Nothing to eat or drink after midnight except a sip of water with medications (see  medication instructions below) ? ?FYI: For your safety, and to allow [place] monitor your vital signs accurately during the surgery/procedure we request that   ?if you have artificial nails, gel coating, SNS etc. Please have those removed prior to your surgery/procedure. Not having the nail coverings /polish removed may result in cancellation or delay of your surgery/procedure. ? ? ?Medication Instructions: ?Hold Spironolactone and Torsemide morning of procedure ? ?You must have a responsible person to drive you home and stay in the waiting area during your procedure. Failure to do so could result in cancellation. ? ?BInterior and spatial designercards. ? ?*Special Note: Every effort is made to have your procedure done on time. Occasionally there are emergencies that occur at the hospital that may cause delays. Please be patient if a delay does occur.  ? ? ?Referrals: ? ?none ? ?Special Instructions // Education: ? ?none ? ?Follow-Up in: 1 month  ? ?[facility] you and your health needs are our priority. We have a designated team specialized in the treatment of Heart Failure. This Care Team includes your primary Heart Failure Specialized Cardiologist (physician), Advanced Practice Providers (APPs- Physician Assistants and Nurse Practitioners), and Pharmacist who all work together to provide you with the care you need, when you need it.  ? ?You may see any of the following providers on your designated Care Team at your next follow up: ? ?Dr DGlori Bickers?Dr DLoralie Champagne?ADarrick Grinder NP ?BLyda Jester PA ?Jessica Milford,NP ?LRia Comment  Suzie Portela, [place] ?Audry Riles, PharmD ? ? ?Please be sure to bring in all your medications bottles to every appointment.  ? ?Need to Contact us: ? ?If you have any questions or concerns before your next appointment please send [place] a message through [place] or call our office at [phone].   ? ?TO LEAVE A MESSAGE FOR THE NURSE SELECT OPTION 2, PLEASE LEAVE A MESSAGE  INCLUDING: ?YOUR NAME ?DATE OF BIRTH ?CALL BACK NUMBER ?REASON FOR CALL**this is important as we prioritize the call backs ? ?YOU WILL RECEIVE A CALL BACK THE SAME DAY AS LONG AS YOU CALL BEFORE 4:00 PM ? ? ?

## 2022-02-19 NOTE — Progress Notes (Signed)
?Advanced Heart Failure Clinic Note  ?PCP: [name], NP ?Primary Cardiologist: Dr. Harl Bowie ?HF Cardiology: Dr. Aundra Dubin  ? ?HPI: ?Daniel Li is a 66 y.o. male with long history of CAD s/p multiple ACS episodes, HTN, HLD, tobacco abuse, DM, OSA, CVA and systolic CHF.  ? ?He has a history of CAD with NSTEMI in 2007 with BMS placement to his D1 and OM2. Also with history of STEMI in May 2013 at which time he had a BMS placed to his RCA. NSTEMI in January of 2014, cath showed CTO of left circumflex, unable to intervene upon. EF 55-60% in September 2016. Anterior STEMI 4/18, DES to pLAD.  ? ?Admitted 8/9 - 06/11/17 with chest pain. Cath as below. No targets for PCI, LAD stent patent. EF remained low at 20-25% as below. Now with St Jude ICD.  ? ?Admitted 4/16 - 02/17/2018 with NSTEMI. Underwent LHC again with unchanged anatomy, no intervention. Discharged back to assisted living.  ? ?He was admitted again in 10/19 with NSTEMI.  LHC showed totally occluded PLV, 75% PDA stenosis, 80% mid LAD, occluded LCx.  He had DES to mLAD. Repeat echo showed EF 20-25%.    RHC/LHC in 9/21 showed unchanged coronaries with occluded PLV and proximal LCx with collaterals, patent LAD, 60% D2, 60% PDA. Normal filling pressures and relatively preserved cardiac output.  ? ?Echo in 5/22 showed EF 25% with mild LV dilation, no LV thrombus, normal RV, mild MR.  ? ?Follow up 11/22 he was more dizzy. BP checks ~ 100/90s. BP low at visit (78/58), ReDs 32%.  Hydral/Imdur stopped and losartan decreased to 12.5. ? ?Admitted 11/05/21 at [facility] for hypotension and AKI. Echo ordered but not completed. Received IVF and beta blocker and losartan stopped at discharge. He was discharged back to facility. ? ?Admitted 11/20/21 to [facility] with CP, concern for ACS. Underwent R/LHC showing significant 3-vessel disease with patent stents; no further stents were placed; CO 3.0 and low PWCP at 7 mmHg. Echo showed EF~25%, no evidence of thrombus, warfarin stopped and  he was transitioned to Plavix. Dig, losartan and spiro stopped due to AKI. PYP scan obtained and showed no evidence of TTR amyloidosis. SPEP and UPEP obtained, results pending. He was discharged back to ALF. ? ?Admitted to [facility] 2/23 with CP and fall. Ethanol on admission 184. Troponin elevated but felt to be in setting of ETOH use and fall.  ? ?Echo was done today and reviewed, EF 20-25% with mild LV dilation, no LV thrombus, moderate-severe MR with ERO 0.39 cm^2 by PISA, normal RV, PASP 49.  ? ?Today he returns for HF follow up. Lives at [facility] in [place] and has a [facility] aide that helps with meds.  Weight down 4 lbs.  He is not smoking now.  He denies ETOH use.  Mild dyspnea after walking about 3 blocks. No orthopnea/PND.  Rare atypical chest pain.  No palpitations or lightheadedness.   ? ?Device interrogation: Thoracic impedance mildly decreased.  ? ?ECG (personally reviewed): NSR, lateral TWIs ? ?Labs (8/18): K 3.7, creatinine 1.42 ?Labs (9/18): digoxin < 0.2 ?Labs (11/18): K 3.8, creatinine 1.03 ?Labs (12/18): K 4, creatinine 1.27, digoxin 0.2, hgb 14.2, LDL 51, HDL 49 ?Labs (03/31/2018): K 3.8 Creatinine 1.1 dig level 0.3  ?Labs (8/19): K 3.5, creatinine 0.98, BNP 88, digoxin 0.4 ?Labs (9/19): K 3.9 Creatinine 1.34, LDL 64, TGs 151 ?Labs (10/19): K 4.2, creatinine 1.29, hgb 12.3 ?Labs (3/20): K 4.5, creatinine 1.2 ?Labs (2/22): digoxin 0.9,  K 4.8, creatinine 1.28 ?Labs (6/22): digoxin 0.6, K 4.2, creatinine 1.22 ?Labs (8/22): K 4.2, creatinine 1.17 ?Labs (1/23): K 3.9, creatinine 0.91, LDL 53 ?Labs (3/23): K 3.4, creatinine 1.10  ? ?Review of systems complete and found to be negative unless listed in HPI.   ? ?Past Medical History ?1. OSA: Supposed to be using CPAP.  ?2. HTN ?3. CAD: Long history.  ?- NSTEMI 2007 with BMS D1 and OM2. ?- STEMI 5/13 with BMS RCA ?- NSTEMI 1/14 with CTO LCx and PLV, unable to open LCx.  ?- STEMI 4/18 with LHC showing old TO LCx with collaterals, old TO PLV, 70% ostial  D1, totally occluded proximal LAD with some collaterals => DES to LAD.  ?- LHC 8/18 with old TO LCx, old TO PLV, 60% mLAD, patent LAD stent.  ?- LHC 4/19 with totally occluded PLV, totally occluded LCx, patent LAD and D1 stents, 50% mid LAD.  ?- NSTEMI 10/19. LHC with totally occluded PLV, totally occluded LCx, patent proximal LAD and D1 stents, 80% mid LAD stenosis => DES to mid LAD.  ?- LHC (9/21): occluded PLV and proximal LCx with collaterals, patent LAD, 60% D2, 60% PDA.  No intervention.  ?- LHC (1/23): significant 3-vessel disease with patent stents; no further stents were placed ?4. Depression ?5. Type II DM ?6. Hyperlipidemia: History of rhabdomyolysis with statins, he is on Zetia.  ?7. Chronic systolic CHF: Ischemic cardiomyopathy.  Echo from 9/16 showed improvement in EF to 55-60%.  St Jude ICD.  ?- Echo (4/18) with EF 20-25%, dyskinetic apex, moderate-severe MR, severe LAE.  ?- Echo (8/18) with EF 20-25%, moderate to severe MR ?- Echo (4/19) with EF 20-25%  ?- Echo (10/19) with EF 20-25%, no LV thrombus.  ?- RHC (10/19): mean RA 4, PA 22/7, mean PCWP 7, CI 2.3 ?- RHC (9/21): mean RA 1, PA 18/5, mean PCWP 7, CI 2.12 (F)/2.17(T) ?- Echo (5/22): EF 25% with mild LV dilation, no LV thrombus, normal RV, mild MR. ?- RHC (1/23): CO 3.0 and low PWCP at 7 mmHg ?- Echo (1/23): EF 25% ?- Echo (4/23): EF 20-25% with mild LV dilation, no LV thrombus, moderate-severe MR with ERO 0.39 cm^2 by PISA, normal RV, PASP 49.  ?8. PAD: ?- ABIs (4/18) with R ABI of 0.84 suggestive of mild disease.  L ABI of 1.27 (Normal flow at rest) ?- ABIs (5/19): abnormal TBI on right.  ?- ABIs (10/21): Normal ?9. LV thrombus ?10. H/o CVA ?11. Mitral regurgitation: Likely functional. Echo in 4/23 with moderate-severe MR.  ?  ?Current Outpatient Medications  ?Medication Sig Dispense Refill  ? acetaminophen (TYLENOL) 325 MG tablet Take 650 mg by mouth every 6 (six) hours as needed for mild pain, fever or headache.     ? carvedilol (COREG)  3.125 MG tablet Take 1 tablet (3.125 mg total) by mouth 2 (two) times daily with a meal. 60 tablet 11  ? clopidogrel (PLAVIX) 75 MG tablet Take 75 mg by mouth daily.    ? diclofenac Sodium (VOLTAREN) 1 % GEL Apply 2 g topically 4 (four) times daily as needed.    ? Emollient (CERAVE SA ROUGH & BUMPY SKIN) LOTN Apply topically. Apply to both feet Twice daily    ? empagliflozin (JARDIANCE) 10 MG TABS tablet Take 10 mg by mouth daily.    ? Evolocumab (REPATHA SURECLICK) 932 MG/ML SOAJ INJECT CONTENTS OF ONE PREFILLED PEN SUBCUTANEOUSLY INTO THE SKIN EVERY 14 DAYS. 2 mL 11  ? ezetimibe (ZETIA) 10  MG tablet Take 1 tablet (10 mg total) by mouth daily. 30 tablet 3  ? fenofibrate (TRICOR) 145 MG tablet TAKE ONE TABLET BY MOUTH EVERY DAY. 30 tablet 11  ? gabapentin (NEURONTIN) 100 MG capsule Take 100 mg by mouth 2 (two) times daily. As needed    ? metFORMIN (GLUCOPHAGE) 500 MG tablet Take 1,000 mg by mouth 2 (two) times daily with a meal.    ? nitroGLYCERIN (NITROSTAT) 0.4 MG SL tablet PLACE ONE (1) TABLET UNDER TONGUE EVERY 5 MINUTES UP TO (3) DOSES AS NEEDED FOR CHEST PAIN. IF NO RELIEF, CONTACT MD. 25 tablet 3  ? pantoprazole (PROTONIX) 40 MG tablet Take 40 mg by mouth daily.    ? potassium chloride (KLOR-CON) 10 MEQ tablet Take 1 tablet (10 mEq total) by mouth every other day. 30 tablet 5  ? spironolactone (ALDACTONE) 25 MG tablet Take 0.5 tablets (12.5 mg total) by mouth daily. 15 tablet 6  ? torsemide (DEMADEX) 20 MG tablet Take 1 tablet (20 mg total) by mouth every other day. 30 tablet 5  ? ?No current facility-administered medications for this encounter.  ? ?Allergies  ?Allergen Reactions  ? Contrast Media [Iodinated Contrast Media] Other (See Comments)  ?  Does not eat for religious reasons - okay with using IV heparin  ? Pork-Derived Products Other (See Comments)  ?  Does not eat for religious reasons - okay with using IV heparin  ? ?Social History  ? ?Socioeconomic History  ? Marital status: Single  ?  Spouse name:  Not on file  ? Number of children: 0  ? Years of education: 27  ? Highest education level: 9th grade  ?Occupational History  ? Not on file  ?Tobacco Use  ? Smoking status: Some Days  ?  Packs/day: 0.50

## 2022-02-21 ENCOUNTER — Encounter (HOSPITAL_COMMUNITY): Payer: Self-pay | Admitting: Cardiology

## 2022-02-26 ENCOUNTER — Other Ambulatory Visit: Payer: Self-pay | Admitting: Cardiology

## 2022-02-28 ENCOUNTER — Other Ambulatory Visit (HOSPITAL_COMMUNITY): Payer: Self-pay | Admitting: Cardiology

## 2022-02-28 ENCOUNTER — Encounter (HOSPITAL_COMMUNITY): Payer: Self-pay | Admitting: Certified Registered Nurse Anesthetist

## 2022-02-28 ENCOUNTER — Ambulatory Visit (HOSPITAL_COMMUNITY): Admission: RE | Admit: 2022-02-28 | Payer: Medicare Other | Source: Home / Self Care | Admitting: Cardiology

## 2022-02-28 DIAGNOSIS — I340 Nonrheumatic mitral (valve) insufficiency: Secondary | ICD-10-CM

## 2022-02-28 SURGERY — ECHOCARDIOGRAM, TRANSESOPHAGEAL
Anesthesia: Monitor Anesthesia Care

## 2022-02-28 SURGERY — RIGHT HEART CATH
Anesthesia: LOCAL

## 2022-02-28 NOTE — Progress Notes (Signed)
Remote ICD transmission.   

## 2022-03-01 ENCOUNTER — Other Ambulatory Visit (HOSPITAL_COMMUNITY): Payer: Medicare Other

## 2022-03-08 ENCOUNTER — Telehealth (HOSPITAL_COMMUNITY): Payer: Self-pay | Admitting: Cardiology

## 2022-03-08 NOTE — Telephone Encounter (Signed)
Per Dr Aundra Dubin ?Will need to reschedule procedures as transportation was unavailable at original appt. ? ? ?You are scheduled for a Cardiac Catheterization and TEE on Friday, May 25 with Dr. [name]. ?  ?1. Please arrive at the Main Entrance A at [facility]: [place], [place] [address] at 8:30 AM (This time is two hours before your procedure to ensure your preparation). Free valet parking service is available.  ?  ?Special note: Every effort is made to have your procedure done on time. Please understand that emergencies sometimes delay scheduled procedures. ?  ?2. Diet: Do not eat solid foods after midnight.  You may have clear liquids until 5 AM upon the day of the procedure. ?  ?3. Labs: completed today 02/19/2022 ?  ?4. Medication instructions in preparation for your procedure: ?  ? Contrast Allergy: No ?  ?Hold Torsemide and Spironolactone on Friday May 25,2023. ?  ?  ?Do not take Diabetes Med Glucophage (Metformin) on the day of the procedure and HOLD 48 HOURS AFTER THE PROCEDURE. ?  ?On the morning of your procedure, take Plavix/Clopidogrel and any morning medicines NOT listed above.  You may use sips of water. ?  ?5. Plan to stay overnight for observation after your procedure. ?6. You MUST have a responsible adult to drive you home. ?7. An adult MUST be with you the first 24 hours after you arrive home. ?8. Bring a current list of your medications, and the last time and date medication taken. ?9. Bring ID and current insurance cards. ?10.Please wear clothes that are easy to get on and off and wear slip-on shoes. ?  ?  ?  ?

## 2022-03-12 ENCOUNTER — Ambulatory Visit (INDEPENDENT_AMBULATORY_CARE_PROVIDER_SITE_OTHER): Payer: Medicare Other

## 2022-03-12 DIAGNOSIS — Z95810 Presence of automatic (implantable) cardiac defibrillator: Secondary | ICD-10-CM

## 2022-03-12 DIAGNOSIS — I5022 Chronic systolic (congestive) heart failure: Secondary | ICD-10-CM

## 2022-03-14 NOTE — Progress Notes (Signed)
EPIC Encounter for ICM Monitoring ? ?Patient Name: Daniel Li is a 66 y.o. male ?Date: 03/14/2022 ?Primary Care Physican: [name], NP ?Primary Cardiologist: [facility] ?Electrophysiologist: Allred ?02/19/2022 Office Weight: 153 lbs                                                ?  ?Transmission reviewed.   Rt Heart cath and TEE scheduled for 03/22/2022 ?  ?CorVue thoracic impedance suggesting normal fluid levels.   Impedance was suggesting possible dryness 5/3-5/12 and possible fluid accumulation 4/9-4/18. ?  ?Prescribed dosage:  ?Torsemide 20 mg Take 20 mg by mouth See admin instructions. Take 20 mg every other day, may take 20 mg once daily as needed for weight gain greater than 3 lb/24 hrs or 5 lb/1 week ?Potassium 10 mEq take 1 tablet by mouth every other day ?Spironolactone 25 mg take 0.5 tablet (12.5 mg total) daily ?  ?Labs: ?02/19/2022 Creatinine 1.00, BUN 11, Potassium 3.9, Sodium 141, GFR >60 ?12/29/2021 Creatinine 1.10, BUN 14, Potassium 3.4, Sodium 145 Care Everywhere ?12/28/2021 Creatinine 0.96, BUN 10, Potassium 4.0, Sodium 145 Care Everywhere ?12/26/2021 Creatinine 1.07, BUN 9,   Potassium 4.0, Sodium 145 Care Everywhere ?A complete set of results can be found in Results Review. ?  ?Recommendations:  No changes ?  ?Follow-up plan: ICM clinic phone appointment on 04/16/2022.    91 day device clinic remote transmission 05/14/2022.   ?  ?EP/Cardiology Office Visits:   03/21/2022 with Dr Aundra Dubin.  03/19/2022 with Dr Rayann Heman. ?  ?Copy of ICM check sent to Dr. Rayann Heman.  ? ?3 month ICM trend: 03/12/2022. ? ? ? ?12-14 Month ICM trend:  ? ? ? ?Rosalene Billings, RN ?03/14/2022 ?8:16 AM ? ?

## 2022-03-19 ENCOUNTER — Encounter: Payer: Medicaid Other | Admitting: Internal Medicine

## 2022-03-21 ENCOUNTER — Encounter (HOSPITAL_COMMUNITY): Payer: Medicare Other

## 2022-03-21 ENCOUNTER — Telehealth (HOSPITAL_COMMUNITY): Payer: Self-pay

## 2022-03-21 ENCOUNTER — Other Ambulatory Visit (HOSPITAL_COMMUNITY): Payer: Self-pay

## 2022-03-21 ENCOUNTER — Encounter (HOSPITAL_COMMUNITY): Payer: Self-pay

## 2022-03-21 DIAGNOSIS — I5042 Chronic combined systolic (congestive) and diastolic (congestive) heart failure: Secondary | ICD-10-CM

## 2022-03-21 DIAGNOSIS — I5022 Chronic systolic (congestive) heart failure: Secondary | ICD-10-CM

## 2022-03-21 NOTE — Telephone Encounter (Signed)
Spoke to [employer] (patient's caregiver)about angiogram and TEE scheduled for tomorrow.Aware to hold  spironolactone,diuretic and metformin. Aware of nothing to eat after midnight.

## 2022-03-21 NOTE — Anesthesia Preprocedure Evaluation (Signed)
Anesthesia Evaluation  Patient identified by MRN, date of birth, ID band Patient awake    Reviewed: Allergy & Precautions, NPO status , Patient's Chart, lab work & pertinent test results  Airway Mallampati: II  TM Distance: >3 FB Neck ROM: Full    Dental  (+) Poor Dentition, Missing, Chipped Very poor dentition, patient is scheduled to have all teeth removed on the 03/26/22 per patient. :   Pulmonary neg pulmonary ROS, sleep apnea , Current Smoker and Patient abstained from smoking.,    Pulmonary exam normal breath sounds clear to auscultation       Cardiovascular hypertension, Pt. on medications + angina + CAD, + Past MI and +CHF (EF 20-25%)  + Cardiac Defibrillator  Rhythm:Regular Rate:Normal  EKG: Normal sinus rhythm Anterior infarct , age undetermined ST & T wave abnormality, consider inferolateral ischemia Abnormal ECG When compared with ECG of 19-Feb-2022 11:12, PREVIOUS ECG IS PRESENT  ECHO  1. Left ventricular ejection fraction, by estimation, is 20 to 25%. The  left ventricle has severely decreased function. The left ventricle  demonstrates regional wall motion abnormalities with akinesis of the mid  to apical anteroseptal and inferoseptal  walls, akinesis of the inferolateral wall, and akinesis of the  apex/peri-apical segments. No LV thrombus noted. The left ventricular  internal cavity size was mildly dilated. Left ventricular diastolic  parameters are consistent with Grade III diastolic  dysfunction (restrictive).  2. Right ventricular systolic function is normal. The right ventricular  size is normal. There is moderately elevated pulmonary artery systolic  pressure. The estimated right ventricular systolic pressure is 05.6 mmHg.  3. Left atrial size was severely dilated.  4. Right atrial size was mildly dilated.  5. The mitral valve is abnormal. Moderate to severe mitral valve  regurgitation, suspect  functional MR with dilated LV. ERO 0.39 cm^2 by  PISA. No evidence of mitral stenosis.  6. The aortic valve is tricuspid. Aortic valve regurgitation is not  visualized. No aortic stenosis is present.  7. The inferior vena cava is dilated in size with >50% respiratory  variability, suggesting right atrial pressure of 8 mmHg.    Neuro/Psych  Headaches, PSYCHIATRIC DISORDERS Anxiety Depression  Neuromuscular disease (sciatica) CVA, Residual Symptoms negative neurological ROS  negative psych ROS   GI/Hepatic negative GI ROS, Neg liver ROS, GERD  ,  Endo/Other  diabetes, Poorly Controlled, Type 2  Renal/GU Renal InsufficiencyRenal diseasenegative Renal ROS  negative genitourinary   Musculoskeletal  (+) Arthritis , Osteoarthritis,    Abdominal   Peds negative pediatric ROS (+)  Hematology negative hematology ROS (+)   Anesthesia Other Findings   Reproductive/Obstetrics negative OB ROS                           Anesthesia Physical Anesthesia Plan  ASA: 4  Anesthesia Plan: MAC   Post-op Pain Management: Minimal or no pain anticipated   Induction: Intravenous  PONV Risk Score and Plan: 1 and Propofol infusion, TIVA, Treatment may vary due to age or medical condition and Ondansetron  Airway Management Planned: Nasal Cannula, Natural Airway and Simple Face Mask  Additional Equipment: None  Intra-op Plan:   Post-operative Plan:   Informed Consent: I have reviewed the patients History and Physical, chart, labs and discussed the procedure including the risks, benefits and alternatives for the proposed anesthesia with the patient or authorized representative who has indicated his/her understanding and acceptance.       Plan Discussed with:  CRNA, Anesthesiologist and Surgeon  Anesthesia Plan Comments: (Phenylephrine gtt available. Very poor dentition. Denies teeth being loose. Has plan for extractions soon. Baseline hypotension. Patient  asymptomatic. Scheduled for cath today after TEE. Norton Blizzard, MD  )      Anesthesia Quick Evaluation

## 2022-03-22 ENCOUNTER — Ambulatory Visit (HOSPITAL_COMMUNITY)
Admission: RE | Disposition: A | Discharge status: Home / Self Care | Payer: Self-pay | Source: Home / Self Care | Attending: Cardiology

## 2022-03-22 ENCOUNTER — Observation Stay (HOSPITAL_COMMUNITY)
Admission: RE | Admit: 2022-03-22 | Discharge: 2022-03-23 | Disposition: A | Discharge status: Home / Self Care | Payer: Medicare Other | Attending: Cardiology | Admitting: Cardiology

## 2022-03-22 ENCOUNTER — Ambulatory Visit (HOSPITAL_BASED_OUTPATIENT_CLINIC_OR_DEPARTMENT_OTHER): Payer: Medicare Other

## 2022-03-22 ENCOUNTER — Encounter (HOSPITAL_COMMUNITY): Payer: Self-pay | Admitting: Cardiology

## 2022-03-22 ENCOUNTER — Encounter (HOSPITAL_COMMUNITY)
Admission: RE | Disposition: A | Discharge status: Home / Self Care | Payer: Self-pay | Source: Home / Self Care | Attending: Cardiology

## 2022-03-22 ENCOUNTER — Other Ambulatory Visit: Payer: Self-pay

## 2022-03-22 ENCOUNTER — Ambulatory Visit (HOSPITAL_COMMUNITY): Payer: Medicare Other | Admitting: Anesthesiology

## 2022-03-22 ENCOUNTER — Ambulatory Visit (HOSPITAL_BASED_OUTPATIENT_CLINIC_OR_DEPARTMENT_OTHER): Payer: Medicare Other | Admitting: Anesthesiology

## 2022-03-22 DIAGNOSIS — F1090 Alcohol use, unspecified, uncomplicated: Secondary | ICD-10-CM | POA: Diagnosis not present

## 2022-03-22 DIAGNOSIS — I252 Old myocardial infarction: Secondary | ICD-10-CM

## 2022-03-22 DIAGNOSIS — I25110 Atherosclerotic heart disease of native coronary artery with unstable angina pectoris: Secondary | ICD-10-CM | POA: Insufficient documentation

## 2022-03-22 DIAGNOSIS — Z79899 Other long term (current) drug therapy: Secondary | ICD-10-CM | POA: Diagnosis not present

## 2022-03-22 DIAGNOSIS — F17210 Nicotine dependence, cigarettes, uncomplicated: Secondary | ICD-10-CM | POA: Diagnosis not present

## 2022-03-22 DIAGNOSIS — Z7902 Long term (current) use of antithrombotics/antiplatelets: Secondary | ICD-10-CM | POA: Insufficient documentation

## 2022-03-22 DIAGNOSIS — I5022 Chronic systolic (congestive) heart failure: Secondary | ICD-10-CM | POA: Insufficient documentation

## 2022-03-22 DIAGNOSIS — E119 Type 2 diabetes mellitus without complications: Secondary | ICD-10-CM | POA: Diagnosis not present

## 2022-03-22 DIAGNOSIS — I110 Hypertensive heart disease with heart failure: Secondary | ICD-10-CM | POA: Insufficient documentation

## 2022-03-22 DIAGNOSIS — I471 Supraventricular tachycardia: Secondary | ICD-10-CM | POA: Diagnosis not present

## 2022-03-22 DIAGNOSIS — Z8673 Personal history of transient ischemic attack (TIA), and cerebral infarction without residual deficits: Secondary | ICD-10-CM | POA: Diagnosis not present

## 2022-03-22 DIAGNOSIS — Z7984 Long term (current) use of oral hypoglycemic drugs: Secondary | ICD-10-CM | POA: Diagnosis not present

## 2022-03-22 DIAGNOSIS — Z95810 Presence of automatic (implantable) cardiac defibrillator: Secondary | ICD-10-CM | POA: Diagnosis not present

## 2022-03-22 DIAGNOSIS — I340 Nonrheumatic mitral (valve) insufficiency: Principal | ICD-10-CM | POA: Diagnosis present

## 2022-03-22 DIAGNOSIS — I25119 Atherosclerotic heart disease of native coronary artery with unspecified angina pectoris: Secondary | ICD-10-CM | POA: Diagnosis not present

## 2022-03-22 DIAGNOSIS — I5042 Chronic combined systolic (congestive) and diastolic (congestive) heart failure: Secondary | ICD-10-CM

## 2022-03-22 DIAGNOSIS — I509 Heart failure, unspecified: Secondary | ICD-10-CM

## 2022-03-22 DIAGNOSIS — Z955 Presence of coronary angioplasty implant and graft: Secondary | ICD-10-CM | POA: Insufficient documentation

## 2022-03-22 HISTORY — PX: RIGHT HEART CATH: CATH118263

## 2022-03-22 HISTORY — PX: TEE WITHOUT CARDIOVERSION: SHX5443

## 2022-03-22 LAB — POCT I-STAT EG7
Acid-Base Excess: 0 mmol/L (ref 0.0–2.0)
Acid-base deficit: 1 mmol/L (ref 0.0–2.0)
Bicarbonate: 25.5 mmol/L (ref 20.0–28.0)
Bicarbonate: 26 mmol/L (ref 20.0–28.0)
Calcium, Ion: 1.3 mmol/L (ref 1.15–1.40)
Calcium, Ion: 1.31 mmol/L (ref 1.15–1.40)
HCT: 37 % — ABNORMAL LOW (ref 39.0–52.0)
HCT: 37 % — ABNORMAL LOW (ref 39.0–52.0)
Hemoglobin: 12.6 g/dL — ABNORMAL LOW (ref 13.0–17.0)
Hemoglobin: 12.6 g/dL — ABNORMAL LOW (ref 13.0–17.0)
O2 Saturation: 60 %
O2 Saturation: 66 %
Potassium: 4 mmol/L (ref 3.5–5.1)
Potassium: 4 mmol/L (ref 3.5–5.1)
Sodium: 143 mmol/L (ref 135–145)
Sodium: 143 mmol/L (ref 135–145)
TCO2: 27 mmol/L (ref 22–32)
TCO2: 27 mmol/L (ref 22–32)
pCO2, Ven: 46.2 mmHg (ref 44–60)
pCO2, Ven: 46.4 mmHg (ref 44–60)
pH, Ven: 7.348 (ref 7.25–7.43)
pH, Ven: 7.357 (ref 7.25–7.43)
pO2, Ven: 33 mmHg (ref 32–45)
pO2, Ven: 36 mmHg (ref 32–45)

## 2022-03-22 LAB — CBC
HCT: 43.8 % (ref 39.0–52.0)
Hemoglobin: 13.6 g/dL (ref 13.0–17.0)
MCH: 24.4 pg — ABNORMAL LOW (ref 26.0–34.0)
MCHC: 31.1 g/dL (ref 30.0–36.0)
MCV: 78.6 fL — ABNORMAL LOW (ref 80.0–100.0)
Platelets: 258 10*3/uL (ref 150–400)
RBC: 5.57 MIL/uL (ref 4.22–5.81)
RDW: 17 % — ABNORMAL HIGH (ref 11.5–15.5)
WBC: 5.4 10*3/uL (ref 4.0–10.5)
nRBC: 0 % (ref 0.0–0.2)

## 2022-03-22 LAB — BASIC METABOLIC PANEL
Anion gap: 8 (ref 5–15)
BUN: 9 mg/dL (ref 8–23)
CO2: 29 mmol/L (ref 22–32)
Calcium: 10 mg/dL (ref 8.9–10.3)
Chloride: 104 mmol/L (ref 98–111)
Creatinine, Ser: 1.11 mg/dL (ref 0.61–1.24)
GFR, Estimated: >60 mL/min (ref >60)
Glucose, Bld: 107 mg/dL — ABNORMAL HIGH (ref 70–99)
Potassium: 4.4 mmol/L (ref 3.5–5.1)
Sodium: 141 mmol/L (ref 135–145)

## 2022-03-22 LAB — ECHO TEE
MV M vel: 3.83 m/s
MV Peak grad: 58.7 mmHg
Radius: 0.9 cm

## 2022-03-22 LAB — GLUCOSE, CAPILLARY: Glucose-Capillary: 103 mg/dL — ABNORMAL HIGH (ref 70–99)

## 2022-03-22 SURGERY — ECHOCARDIOGRAM, TRANSESOPHAGEAL
Anesthesia: Monitor Anesthesia Care

## 2022-03-22 SURGERY — RIGHT HEART CATH
Anesthesia: LOCAL

## 2022-03-22 MED ORDER — GABAPENTIN 100 MG PO CAPS
100.0000 mg | ORAL_CAPSULE | Freq: Two times a day (BID) | ORAL | Status: DC
  Administered 2022-03-22 – 2022-03-23 (×3): 100 mg via ORAL
  Filled 2022-03-22 (×3): qty 1

## 2022-03-22 MED ORDER — TORSEMIDE 20 MG PO TABS
20.0000 mg | ORAL_TABLET | ORAL | Status: DC
  Administered 2022-03-23: 20 mg via ORAL
  Filled 2022-03-22: qty 1

## 2022-03-22 MED ORDER — ETOMIDATE 2 MG/ML IV SOLN
INTRAVENOUS | Status: DC | PRN
  Administered 2022-03-22 (×2): 4 mg via INTRAVENOUS
  Administered 2022-03-22 (×2): 6 mg via INTRAVENOUS

## 2022-03-22 MED ORDER — SODIUM CHLORIDE 0.9% FLUSH: 3.0000 mL | INTRAVENOUS | Status: DC | PRN

## 2022-03-22 MED ORDER — ONDANSETRON HCL 4 MG/2ML IJ SOLN: 4.0000 mg | Freq: Four times a day (QID) | INTRAMUSCULAR | Status: DC | PRN

## 2022-03-22 MED ORDER — SODIUM CHLORIDE 0.9% FLUSH
3.0000 mL | INTRAVENOUS | Status: DC | PRN
Start: 2022-03-22 — End: 2022-03-23

## 2022-03-22 MED ORDER — SODIUM CHLORIDE 0.9 % IV SOLN
250.0000 mL | INTRAVENOUS | Status: DC | PRN
Start: 2022-03-22 — End: 2022-03-23

## 2022-03-22 MED ORDER — SODIUM CHLORIDE 0.9% FLUSH
3.0000 mL | Freq: Two times a day (BID) | INTRAVENOUS | Status: DC
  Administered 2022-03-22: 3 mL via INTRAVENOUS

## 2022-03-22 MED ORDER — ACETAMINOPHEN 325 MG PO TABS: 650.0000 mg | ORAL_TABLET | ORAL | Status: DC | PRN

## 2022-03-22 MED ORDER — PANTOPRAZOLE SODIUM 40 MG PO TBEC
40.0000 mg | DELAYED_RELEASE_TABLET | Freq: Every day | ORAL | Status: DC
  Administered 2022-03-23: 40 mg via ORAL
  Filled 2022-03-22: qty 1

## 2022-03-22 MED ORDER — LIDOCAINE HCL (PF) 1 % IJ SOLN
INTRAMUSCULAR | Status: AC
  Filled 2022-03-22: qty 30

## 2022-03-22 MED ORDER — SODIUM CHLORIDE 0.9 % IV SOLN
INTRAVENOUS | Status: DC
  Administered 2022-03-22: 500 mL via INTRAVENOUS

## 2022-03-22 MED ORDER — EMPAGLIFLOZIN 10 MG PO TABS
10.0000 mg | ORAL_TABLET | Freq: Every day | ORAL | Status: DC
  Administered 2022-03-23: 10 mg via ORAL
  Filled 2022-03-22: qty 1

## 2022-03-22 MED ORDER — SODIUM CHLORIDE 0.9% FLUSH
3.0000 mL | Freq: Two times a day (BID) | INTRAVENOUS | Status: DC
  Administered 2022-03-22 – 2022-03-23 (×2): 3 mL via INTRAVENOUS

## 2022-03-22 MED ORDER — POTASSIUM CHLORIDE ER 10 MEQ PO TBCR
10.0000 meq | EXTENDED_RELEASE_TABLET | ORAL | Status: DC
  Administered 2022-03-23: 10 meq via ORAL
  Filled 2022-03-22 (×2): qty 1

## 2022-03-22 MED ORDER — CLOPIDOGREL BISULFATE 75 MG PO TABS
75.0000 mg | ORAL_TABLET | Freq: Every day | ORAL | Status: DC
  Administered 2022-03-23: 75 mg via ORAL
  Filled 2022-03-22: qty 1

## 2022-03-22 MED ORDER — HYDRALAZINE HCL 20 MG/ML IJ SOLN: 10.0000 mg | INTRAMUSCULAR | Status: AC | PRN

## 2022-03-22 MED ORDER — LABETALOL HCL 5 MG/ML IV SOLN: 10.0000 mg | INTRAVENOUS | Status: AC | PRN

## 2022-03-22 MED ORDER — SODIUM CHLORIDE 0.9% FLUSH: 3.0000 mL | Freq: Two times a day (BID) | INTRAVENOUS | Status: DC

## 2022-03-22 MED ORDER — EZETIMIBE 10 MG PO TABS
10.0000 mg | ORAL_TABLET | Freq: Every day | ORAL | Status: DC
  Administered 2022-03-23: 10 mg via ORAL
  Filled 2022-03-22: qty 1

## 2022-03-22 MED ORDER — ENOXAPARIN SODIUM 40 MG/0.4ML IJ SOSY
40.0000 mg | PREFILLED_SYRINGE | INTRAMUSCULAR | Status: DC
  Administered 2022-03-22: 40 mg via SUBCUTANEOUS
  Filled 2022-03-22: qty 0.4

## 2022-03-22 MED ORDER — METFORMIN HCL 500 MG PO TABS
1000.0000 mg | ORAL_TABLET | Freq: Two times a day (BID) | ORAL | Status: DC
  Administered 2022-03-23: 1000 mg via ORAL
  Filled 2022-03-22: qty 2

## 2022-03-22 MED ORDER — HEPARIN (PORCINE) IN NACL 1000-0.9 UT/500ML-% IV SOLN
INTRAVENOUS | Status: AC
  Filled 2022-03-22: qty 500

## 2022-03-22 MED ORDER — DIGOXIN 125 MCG PO TABS
0.1250 mg | ORAL_TABLET | Freq: Every day | ORAL | Status: DC
  Administered 2022-03-22 – 2022-03-23 (×2): 0.125 mg via ORAL
  Filled 2022-03-22 (×2): qty 1

## 2022-03-22 MED ORDER — SODIUM CHLORIDE 0.9 % IV SOLN
250.0000 mL | INTRAVENOUS | Status: DC | PRN
  Administered 2022-03-22 (×2): 250 mL via INTRAVENOUS

## 2022-03-22 MED ORDER — LIDOCAINE 2% (20 MG/ML) 5 ML SYRINGE
INTRAMUSCULAR | Status: DC | PRN
  Administered 2022-03-22: 100 mg via INTRAVENOUS

## 2022-03-22 MED ORDER — PROPOFOL 500 MG/50ML IV EMUL
INTRAVENOUS | Status: DC | PRN
  Administered 2022-03-22: 75 ug/kg/min via INTRAVENOUS

## 2022-03-22 MED ORDER — FENOFIBRATE 160 MG PO TABS
160.0000 mg | ORAL_TABLET | Freq: Every day | ORAL | Status: DC
  Administered 2022-03-23: 160 mg via ORAL
  Filled 2022-03-22: qty 1

## 2022-03-22 MED ORDER — SODIUM CHLORIDE 0.9 % IV SOLN: INTRAVENOUS | Status: DC

## 2022-03-22 MED ORDER — PHENYLEPHRINE HCL-NACL 20-0.9 MG/250ML-% IV SOLN
INTRAVENOUS | Status: DC | PRN
  Administered 2022-03-22: 25 ug/min via INTRAVENOUS

## 2022-03-22 MED ORDER — SPIRONOLACTONE 12.5 MG HALF TABLET
12.5000 mg | ORAL_TABLET | Freq: Every day | ORAL | Status: DC
  Administered 2022-03-22 – 2022-03-23 (×2): 12.5 mg via ORAL
  Filled 2022-03-22 (×2): qty 1

## 2022-03-22 SURGICAL SUPPLY — 9 items
CATH BALLN WEDGE 5F 110CM (CATHETERS) ×1 IMPLANT
GUIDEWIRE .025 260CM (WIRE) ×1 IMPLANT
PACK CARDIAC CATHETERIZATION (CUSTOM PROCEDURE TRAY) ×2 IMPLANT
PROTECTION STATION PRESSURIZED (MISCELLANEOUS) ×2
SHEATH GLIDE SLENDER 4/5FR (SHEATH) ×1 IMPLANT
STATION PROTECTION PRESSURIZED (MISCELLANEOUS) IMPLANT
TRANSDUCER W/STOPCOCK (MISCELLANEOUS) ×2 IMPLANT
TUBING ART PRESS 72  MALE/FEM (TUBING) ×2
TUBING ART PRESS 72 MALE/FEM (TUBING) IMPLANT

## 2022-03-22 NOTE — CV Procedure (Signed)
Procedure: TEE  Sedation: Per anesthesiology  Indication: Mitral regurgitation.   Findings: Please see echo section for full report.  Mitral regurgitation looks moderate-severe.   [name] 03/22/2022 10:33 AM

## 2022-03-22 NOTE — Plan of Care (Signed)
  Problem: Education: Goal: Knowledge of General Education information will improve Description Including pain rating scale, medication(s)/side effects and non-pharmacologic comfort measures Outcome: Progressing   

## 2022-03-22 NOTE — Interval H&P Note (Signed)
History and Physical Interval Note:  03/22/2022 11:06 AM  Daniel Li  has presented today for surgery, with the diagnosis of regurg.  The various methods of treatment have been discussed with the patient and family. After consideration of risks, benefits and other options for treatment, the patient has consented to  Procedure(s): RIGHT HEART CATH (N/A) as a surgical intervention.  The patient's history has been reviewed, patient examined, no change in status, stable for surgery.  I have reviewed the patient's chart and labs.  Questions were answered to the patient's satisfaction.     [name] [employer]

## 2022-03-22 NOTE — NC FL2 (Signed)
[place] LEVEL OF CARE SCREENING TOOL     IDENTIFICATION  Patient Name: Daniel Li Birthdate: 11-Feb-1956 Sex: male Admission Date (Current Location): 03/22/2022  [facility] and [place] Number:  [employer] and Address:  [facility] [place] [place] [address] [place], [place] [address]      Provider Number: [id number]  Attending Physician Name and Address:  Larey Dresser, MD  Relative Name and Phone Number:       Current Level of Care: Hospital Recommended Level of Care: Assisted Living Facility Prior Approval Number:    Date Approved/Denied:   PASRR Number:    Discharge Plan: Other (Comment) (ALF)    Current Diagnoses: Patient Active Problem List   Diagnosis Date Noted   Mitral regurgitation 03/22/2022   CAD in native artery 08/23/2020   Unstable angina ([place]) 08/22/2020   Status post coronary artery stent placement    Hyponatremia    Gastroesophageal reflux disease    Dehydration with hyponatremia    Acute on chronic renal failure ([place]) 05/25/2018   Abdominal pain, acute, epigastric 05/17/2018   Hernia of abdominal wall 05/17/2018   Presumed Gastritis 05/17/2018   AKI (acute kidney injury) ([place]) 05/16/2018   Generalized abdominal pain    Elevated troponin    Chest pain 04/29/2018   ICD (implantable cardioverter-defibrillator) in place 03/05/2018   Anticoagulated on Coumadin 03/05/2018   Non-STEMI (non-ST elevated myocardial infarction) ([place]) 02/12/2018   Precordial chest pain 09/98/3382   Chronic systolic CHF (congestive heart failure) ([place]) 02/11/2018   Chronic combined systolic (congestive) and diastolic (congestive) heart failure (H[facility])    STEMI (ST elevation myocardial infarction) ([place]) 02/21/2017   Ischemic cardiomyopathy 07/20/2014   High risk medication use 01/28/2013   Special screening for malignant neoplasms, colon 01/28/2013   Hypercholesterolemia    Tobacco abuse    Diabetes mellitus type 2 with  complications ([place]) 50/53/9767   Benign essential HTN 03/26/2012   CAD S/P percutaneous coronary angioplasty 03/26/2012    Orientation RESPIRATION BLADDER Height & Weight     Self, Time, Situation, Place  Normal Continent Weight: 141 lb 1.5 oz (64 kg) Height:  5' 6.5" (168.9 cm)  BEHAVIORAL SYMPTOMS/MOOD NEUROLOGICAL BOWEL NUTRITION STATUS      Continent Diet (Please See DC Summary)  AMBULATORY STATUS COMMUNICATION OF NEEDS Skin   Limited Assist Verbally Normal                       Personal Care Assistance Level of Assistance  Bathing, Feeding, Dressing Bathing Assistance: Limited assistance Feeding assistance: Independent Dressing Assistance: Limited assistance     Functional Limitations Info  Sight, Hearing, Speech Sight Info: Adequate Hearing Info: Adequate Speech Info: Adequate    SPECIAL CARE FACTORS FREQUENCY                       Contractures Contractures Info: Not present    Additional Factors Info  Code Status, Allergies Code Status Info: Full Allergies Info: Contrast Media (Iodinated Contrast Media), Pork-derived Products           Current Medications (03/22/2022):  This is the current hospital active medication list Current Facility-Administered Medications  Medication Dose Route Frequency Provider Last Rate Last Admin   0.9 %  sodium chloride infusion   Intravenous Continuous Joette Catching, PA-C   Paused at 03/22/22 0915   0.9 %  sodium chloride infusion  250 mL Intravenous PRN Joette Catching,  PA-C   Stopped at 03/22/22 0940   0.9 %  sodium chloride infusion   Intravenous Continuous Joette Catching, PA-C   Stopped at 03/22/22 1504   0.9 %  sodium chloride infusion   Intravenous Continuous Joette Catching, [place]   Stopped at 03/22/22 1653   0.9 %  sodium chloride infusion  250 mL Intravenous PRN Larey Dresser, MD       0.9 %  sodium chloride infusion  250 mL Intravenous PRN Joette Catching, PA-C        acetaminophen (TYLENOL) tablet 650 mg  650 mg Oral Q4H PRN Larey Dresser, MD       [START ON 03/23/2022] clopidogrel (PLAVIX) tablet 75 mg  75 mg Oral Daily Joette Catching, [place]       digoxin Fonnie Birkenhead) tablet 0.125 mg  0.125 mg Oral Daily Larey Dresser, MD   0.125 mg at 03/22/22 1645   [START ON 03/23/2022] empagliflozin (JARDIANCE) tablet 10 mg  10 mg Oral Daily Joette Catching, PA-C       enoxaparin (LOVENOX) injection 40 mg  40 mg Subcutaneous Q24H Marlyce Huge [place], [place]       [START ON 03/23/2022] ezetimibe (ZETIA) tablet 10 mg  10 mg Oral Daily Joette Catching, [place]       [START ON 03/23/2022] fenofibrate tablet 160 mg  160 mg Oral Daily Joette Catching, PA-C       gabapentin (NEURONTIN) capsule 100 mg  100 mg Oral BID Joette Catching, PA-C   100 mg at 03/22/22 1645   hydrALAZINE (APRESOLINE) injection 10 mg  10 mg Intravenous Q20 Min PRN Larey Dresser, MD       labetalol (NORMODYNE) injection 10 mg  10 mg Intravenous Q10 min PRN Larey Dresser, MD       [START ON 03/23/2022] metFORMIN (GLUCOPHAGE) tablet 1,000 mg  1,000 mg Oral BID WC Joette Catching, PA-C       ondansetron [facility]) injection 4 mg  4 mg Intravenous Q6H PRN Larey Dresser, MD       [START ON 03/23/2022] pantoprazole (PROTONIX) EC tablet 40 mg  40 mg Oral Daily Marlyce Huge [place], [place]       [START ON 03/23/2022] potassium chloride (KLOR-CON) CR tablet 10 mEq  10 mEq Oral QODAY Joette Catching, PA-C       sodium chloride flush (NS) 0.9 % injection 3 mL  3 mL Intravenous Q12H Joette Catching, PA-C       sodium chloride flush (NS) 0.9 % injection 3 mL  3 mL Intravenous PRN Joette Catching, PA-C       sodium chloride flush (NS) 0.9 % injection 3 mL  3 mL Intravenous Q12H Larey Dresser, MD       sodium chloride flush (NS) 0.9 % injection 3 mL  3 mL Intravenous PRN Larey Dresser, MD       sodium chloride flush (NS) 0.9 % injection 3 mL  3 mL Intravenous Q12H  Joette Catching, PA-C   3 mL at 03/22/22 1646   sodium chloride flush (NS) 0.9 % injection 3 mL  3 mL Intravenous PRN Joette Catching, PA-C       spironolactone (ALDACTONE) tablet 12.5 mg  12.5 mg Oral Daily Joette Catching, PA-C   12.5 mg at 03/22/22 1645   [START ON 03/23/2022] torsemide (DEMADEX) tablet 20 mg  20 mg Oral QODAY Joette Catching, PA-C  Discharge Medications: Please see discharge summary for a list of discharge medications.  Relevant Imaging Results:  Relevant Lab Results:   Additional Information SSN#: [id number]  Shawnell Dykes, LCSW

## 2022-03-22 NOTE — Transfer of Care (Signed)
Immediate Anesthesia Transfer of Care Note  Patient: Daniel Li  Procedure(s) Performed: TRANSESOPHAGEAL ECHOCARDIOGRAM (TEE)  Patient Location: Cath Lab  Anesthesia Type:MAC  Level of Consciousness: awake, alert  and oriented  Airway & Oxygen Therapy: Patient Spontanous Breathing and Patient connected to nasal cannula oxygen  Post-op Assessment: Report given to RN and Post -op Vital signs reviewed and stable  Post vital signs: Reviewed and stable  Last Vitals:  Vitals Value Taken Time  BP 90/70   Temp    Pulse 80   Resp 16   SpO2 99%     Last Pain:  Vitals:   03/22/22 0759  TempSrc: Temporal  PainSc: 5          Complications: No notable events documented.
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
## 2022-03-22 NOTE — Consult Note (Addendum)
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 the patient, and performing a physical exam, I agree with the findings and plan described in this note.   GEN: No acute distress.   HEENT:  MMM, no JVD, no scleral icterus Cardiac: RRR with holosystolic murmur radiating to axilla Respiratory: Mild rales at bases that clear with coughing  GI: Soft, nontender, non-distended  MS: No edema; No deformity. Neuro:  Nonfocal  Vasc:  +2 radial pulses  The patient is a 66 year old male with a complicated medical history mostly comprised of ischemic cardiomyopathy with an ejection fraction 20 to 25%.  He is also had PCI of the LAD and diagonal.  An ICD has been in place since 2018.  He has a history of hypertension hyperlipidemia.  He resides in assisted living facility.  He is followed closely by the advanced heart failure division.  The patient was admitted for medical optimization.  He underwent a TEE which I  reviewed closely.  This demonstrates a severe cardiomyopathy with non coaptation of the mitral valve leaflets consistent with functional severe mitral regurgitation.  His mean gradient across the valve is 2 mmHg with a mean valve area of 5.9 cm.  His left ventricular internal systolic diameter on TTE was 5.2 cm.  He underwent right heart catheterization as well which demonstrated a decreased cardiac output of 3.8 L/min.  His mean wedge pressure was 21 with V waves to 45 mmHg consistent with severe mitral regurgitation.  His PAPI per RHC was 35/2 (?) suggesting preserved RV function; RV function on his TEE was mildly reduced but not significantly impaired.  I had a long talk with the patient about potential treatment options.  He is a relatively young patient with a severe cardiomyopathy.  His coronary angiography study done a few years ago demonstrated patent LAD stents with occluded left circumflex.  I think given his relatively young age and severe mitral regurgitation that looks to be functional with suitable mitral anatomy, I believe mitral transcatheter edge-to-edge repair would be helpful in this patient in particular by allowing more forward cardiac output and perhaps allow him to raise his blood pressure to allow for better goal-directed medical therapy.  He does not have a left bundle branch block so CRT therapy would not be of any benefit.  His dentition is poor so we will need to have this evaluated prior.  We will arrange outpatient dental evaluation.  We will review his case at our imaging conference on Tuesday and I think mitral valve intervention should be pursued within the next few weeks or months.    Lenna Sciara, MD Pager [phone]

## 2022-03-22 NOTE — Progress Notes (Addendum)
  Transition of Care [facility]) Note   Patient Details  Name: Daniel Li Date of Birth: [birth year]   Transition of Care [facility]) CM/SW Contact:    Ger Nicks, LCSW Phone Number: 03/22/2022, 3:31 PM    Transition of Care Department [facility]) has reviewed patient and [name] is from [facility] in [place], [place]. We will continue to monitor patient advancement through interdisciplinary progression rounds. Patient will benefit from PT/OT consult for disposition recommendations.   HF CSW spoke with [facility] [phone] and they reported they can take [name] back at time of discharge and he will need an [facility] faxed to them at the same number [phone] and to send his updated medications information to [facility] at [phone].  TOC will continue to follow throughout discharge.

## 2022-03-22 NOTE — Anesthesia Postprocedure Evaluation (Signed)
Anesthesia Post Note  Patient: Ryson Bacha Poust  Procedure(s) Performed: TRANSESOPHAGEAL ECHOCARDIOGRAM (TEE)     Patient location during evaluation: Endoscopy Anesthesia Type: MAC Level of consciousness: awake and alert Pain management: pain level controlled Vital Signs Assessment: post-procedure vital signs reviewed and stable Respiratory status: spontaneous breathing, nonlabored ventilation and respiratory function stable Cardiovascular status: blood pressure returned to baseline and stable Postop Assessment: no apparent nausea or vomiting Anesthetic complications: no   No notable events documented.  Last Vitals:  Vitals:   03/22/22 0938 03/22/22 0946  BP: (!) 75/57 (!) 77/61  Resp:    Temp:    SpO2:      Last Pain:  Vitals:   03/22/22 1125  TempSrc:   PainSc: 0-No pain                 Merlinda Frederick

## 2022-03-22 NOTE — Progress Notes (Signed)
  Echocardiogram Echocardiogram Transesophageal has been performed.  Daniel Li 03/22/2022, 10:57 AM

## 2022-03-23 ENCOUNTER — Observation Stay (HOSPITAL_COMMUNITY): Payer: Medicare Other

## 2022-03-23 ENCOUNTER — Other Ambulatory Visit (HOSPITAL_COMMUNITY): Payer: Self-pay

## 2022-03-23 ENCOUNTER — Encounter (HOSPITAL_COMMUNITY): Payer: Self-pay | Admitting: Cardiology

## 2022-03-23 ENCOUNTER — Telehealth: Payer: Self-pay | Admitting: Cardiology

## 2022-03-23 DIAGNOSIS — I340 Nonrheumatic mitral (valve) insufficiency: Secondary | ICD-10-CM | POA: Diagnosis not present

## 2022-03-23 DIAGNOSIS — E119 Type 2 diabetes mellitus without complications: Secondary | ICD-10-CM | POA: Diagnosis not present

## 2022-03-23 DIAGNOSIS — I252 Old myocardial infarction: Secondary | ICD-10-CM | POA: Diagnosis not present

## 2022-03-23 DIAGNOSIS — I5022 Chronic systolic (congestive) heart failure: Secondary | ICD-10-CM | POA: Diagnosis not present

## 2022-03-23 DIAGNOSIS — Z8673 Personal history of transient ischemic attack (TIA), and cerebral infarction without residual deficits: Secondary | ICD-10-CM | POA: Diagnosis not present

## 2022-03-23 LAB — BASIC METABOLIC PANEL
Anion gap: 6 (ref 5–15)
BUN: 11 mg/dL (ref 8–23)
CO2: 27 mmol/L (ref 22–32)
Calcium: 9.7 mg/dL (ref 8.9–10.3)
Chloride: 107 mmol/L (ref 98–111)
Creatinine, Ser: 1.03 mg/dL (ref 0.61–1.24)
GFR, Estimated: >60 mL/min (ref >60)
Glucose, Bld: 103 mg/dL — ABNORMAL HIGH (ref 70–99)
Potassium: 4.1 mmol/L (ref 3.5–5.1)
Sodium: 140 mmol/L (ref 135–145)

## 2022-03-23 LAB — CBC WITH DIFFERENTIAL/PLATELET
Abs Immature Granulocytes: 0.02 10*3/uL (ref 0.00–0.07)
Basophils Absolute: 0 10*3/uL (ref 0.0–0.1)
Basophils Relative: 1 %
Eosinophils Absolute: 0.1 10*3/uL (ref 0.0–0.5)
Eosinophils Relative: 1 %
HCT: 39.8 % (ref 39.0–52.0)
Hemoglobin: 12.8 g/dL — ABNORMAL LOW (ref 13.0–17.0)
Immature Granulocytes: 0 %
Lymphocytes Relative: 34 %
Lymphs Abs: 2 10*3/uL (ref 0.7–4.0)
MCH: 24.6 pg — ABNORMAL LOW (ref 26.0–34.0)
MCHC: 32.2 g/dL (ref 30.0–36.0)
MCV: 76.5 fL — ABNORMAL LOW (ref 80.0–100.0)
Monocytes Absolute: 0.3 10*3/uL (ref 0.1–1.0)
Monocytes Relative: 6 %
Neutro Abs: 3.5 10*3/uL (ref 1.7–7.7)
Neutrophils Relative %: 58 %
Platelets: 207 10*3/uL (ref 150–400)
RBC: 5.2 MIL/uL (ref 4.22–5.81)
RDW: 16.4 % — ABNORMAL HIGH (ref 11.5–15.5)
WBC: 6 10*3/uL (ref 4.0–10.5)
nRBC: 0 % (ref 0.0–0.2)

## 2022-03-23 LAB — TROPONIN I (HIGH SENSITIVITY): Troponin I (High Sensitivity): 23 ng/L — ABNORMAL HIGH (ref <18)

## 2022-03-23 LAB — HIV ANTIBODY (ROUTINE TESTING W REFLEX): HIV Screen 4th Generation wRfx: NONREACTIVE

## 2022-03-23 LAB — GLUCOSE, CAPILLARY: Glucose-Capillary: 74 mg/dL (ref 70–99)

## 2022-03-23 LAB — MAGNESIUM: Magnesium: 2.1 mg/dL (ref 1.7–2.4)

## 2022-03-23 MED ORDER — POTASSIUM CHLORIDE CRYS ER 10 MEQ PO TBCR
10.0000 meq | EXTENDED_RELEASE_TABLET | ORAL | 5 refills | Status: DC
  Filled 2022-03-23: qty 30, 60d supply, fill #0

## 2022-03-23 MED ORDER — TORSEMIDE 20 MG PO TABS
20.0000 mg | ORAL_TABLET | ORAL | 5 refills | Status: DC
  Filled 2022-03-23: qty 30, 60d supply, fill #0

## 2022-03-23 MED ORDER — DIGOXIN 125 MCG PO TABS
0.1250 mg | ORAL_TABLET | Freq: Every day | ORAL | 3 refills | Status: DC
Start: 2022-03-23 — End: 2022-05-03
  Filled 2022-03-23: qty 30, 30d supply, fill #0

## 2022-03-23 MED ORDER — AMIODARONE HCL 200 MG PO TABS
200.0000 mg | ORAL_TABLET | Freq: Two times a day (BID) | ORAL | Status: DC
  Administered 2022-03-23: 200 mg via ORAL
  Filled 2022-03-23: qty 1

## 2022-03-23 MED ORDER — AMIODARONE HCL 200 MG PO TABS
200.0000 mg | ORAL_TABLET | Freq: Two times a day (BID) | ORAL | 3 refills | Status: DC
  Filled 2022-03-23: qty 60, 30d supply, fill #0

## 2022-03-23 MED FILL — Lidocaine HCl Local Preservative Free (PF) Inj 1%: INTRAMUSCULAR | Qty: 30 | Status: AC

## 2022-03-23 MED FILL — Heparin Sod (Porcine)-NaCl IV Soln 1000 Unit/500ML-0.9%: INTRAVENOUS | Qty: 500 | Status: AC

## 2022-03-23 NOTE — Progress Notes (Signed)
Mobility Specialist Progress Note:   03/23/22 1300  Mobility  Activity Ambulated independently in hallway  Level of Assistance Independent  Assistive Device Four point cane  Distance Ambulated (ft) 550 ft  Activity Response Tolerated well  $Mobility charge 1 Mobility   Pt walking independently in hallway.   [facility] Bettie Swavely Mobility Specialist

## 2022-03-23 NOTE — Progress Notes (Signed)
During bedside report, patient reports "pressure in my chest". Denies any pain. He reports this pressure was noticed soon after the 15 beat run of [facility] from earlier today.  Will hand off, and inform Cardiology.

## 2022-03-23 NOTE — TOC Transition Note (Addendum)
Transition of Care [facility]) - CM/SW Discharge Note   Patient Details  Name: Daniel Li MRN: [id number] Date of Birth: 04-10-1956  Transition of Care [facility]) CM/SW Contact:  Sem Mccaughey, LCSW Phone Number: 03/23/2022, 11:58 AM   Clinical Narrative:    Patient will DC to: ALF, [facility] Anticipated DC date: 03/23/22 Transport by: [facility]   Per MD patient ready for DC to ALF, [facility]. RN to call report prior to discharge ([phone]). RN, patient, and facility notified of DC. Discharge Summary and FL2 sent to facility also DC summary sent to [place] to fax#: [phone]. Safe transport requested for patient they will be at the main entrance in 56mns.  CSW will sign off for now as social work intervention is no longer needed. Please consult [email or web address] if new needs arise.     Final next level of care: Assisted Living Barriers to Discharge: No Barriers Identified   Patient Goals and CMS Choice        Discharge Placement                Patient to be transferred to facility by: [facility]/[place]   Patient and family notified of of transfer: 03/23/22  Discharge Plan and Services                                     Social Determinants of Health (SDOH) Interventions     Readmission Risk Interventions     View : No data to display.

## 2022-03-23 NOTE — Progress Notes (Addendum)
Advanced Heart Failure Rounding Note  PCP-Cardiologist: Carlyle Dolly, MD   Subjective:    Had TEE and cath yesterday.   TEE consistent with moderate-severe to severe functional MR. RHC showed elevated PCWP but normal RA pressure.  V-waves were large consistent with significant MR.   Coreg stopped yesterday w/ marginal output and soft BP.   BP remains soft today, 71G-62I systolic.   SCr stable 1.03.   Had 15 beat run of NSVT earlier this morning around 4:30 AM w/ associated chest pressure.  K 4.1 Mg 2.1   EKG NSR w/ lateral ST depressions, c/w previous though a bit more pronounced. Currently CP free.   RHC Procedural Findings: Hemodynamics (mmHg) RA mean 2 RV 49/12 PA 49/12, mean 31 PCWP mean 21 with prominent V-waves to 45 Oxygen saturations: PA 66% AO 99% Cardiac Output (Fick) 3.81  Cardiac Index (Fick) 2.18 PVR 2.6 WU  Objective:   Weight Range: 64 kg Body mass index is 22.45 kg/m.   Vital Signs:   Temp:  [97.9 F (36.6 C)-98.2 F (36.8 C)] 98 F (36.7 C) (05/26 0822) Pulse Rate:  [61-86] 82 (05/26 0822) Resp:  [13-34] 18 (05/26 0822) BP: (75-99)/(57-78) 99/78 (05/26 0822) SpO2:  [94 %-99 %] 98 % (05/26 0359) Weight:  [64 kg] 64 kg (05/26 0123) Last BM Date : 03/22/22  Weight change: Filed Weights   03/22/22 0759 03/22/22 1436 03/23/22 0123  Weight: 66.2 kg 64 kg 64 kg    Intake/Output:   Intake/Output Summary (Last 24 hours) at 03/23/2022 0900 Last data filed at 03/23/2022 0421 Gross per 24 hour  Intake 1000 ml  Output 1350 ml  Net -350 ml      Physical Exam    General:  Well appearing, thin male. No resp difficulty HEENT: Normal Neck: Supple. JVP not elevated . Carotids 2+ bilat; no bruits. No lymphadenopathy or thyromegaly appreciated. Cor: PMI nondisplaced. Regular rate & rhythm. No rubs, gallops or murmurs. Lungs: Clear Abdomen: Soft, nontender, nondistended. No hepatosplenomegaly. No bruits or masses. Good bowel  sounds. Extremities: No cyanosis, clubbing, rash, edema Neuro: Alert & orientedx3, cranial nerves grossly intact. moves all 4 extremities w/o difficulty. Affect pleasant   Telemetry   NSR 80s, 15 beat run of NSVT   EKG    NSR w/ lateral ST depressions, c/w previous though a bit more pronounced  Labs    CBC Recent Labs    03/22/22 0753 03/22/22 1121 03/23/22 0414  WBC 5.4  --  6.0  NEUTROABS  --   --  3.5  HGB 13.6 12.6*  12.6* 12.8*  HCT 43.8 37.0*  37.0* 39.8  MCV 78.6*  --  76.5*  PLT 258  --  948   Basic Metabolic Panel Recent Labs    03/22/22 0753 03/22/22 1121 03/23/22 0414  NA 141 143  143 140  K 4.4 4.0  4.0 4.1  CL 104  --  107  CO2 29  --  27  GLUCOSE 107*  --  103*  BUN 9  --  11  CREATININE 1.11  --  1.03  CALCIUM 10.0  --  9.7  MG  --   --  2.1   Liver Function Tests No results for input(s): AST, ALT, ALKPHOS, BILITOT, PROT, ALBUMIN in the last 72 hours. No results for input(s): LIPASE, AMYLASE in the last 72 hours. Cardiac Enzymes No results for input(s): CKTOTAL, CKMB, CKMBINDEX, TROPONINI in the last 72 hours.  BNP: BNP (last 3 results) Recent  Labs    11/14/21 1525 12/08/21 1224 02/19/22 1211  BNP 271.3* 280.6* 542.9*    ProBNP (last 3 results) No results for input(s): PROBNP in the last 8760 hours.   D-Dimer No results for input(s): DDIMER in the last 72 hours. Hemoglobin A1C No results for input(s): HGBA1C in the last 72 hours. Fasting Lipid Panel No results for input(s): CHOL, HDL, LDLCALC, TRIG, CHOLHDL, LDLDIRECT in the last 72 hours. Thyroid Function Tests No results for input(s): TSH, T4TOTAL, T3FREE, THYROIDAB in the last 72 hours.  Invalid input(s): FREET3  Other results:   Imaging    CARDIAC CATHETERIZATION  Result Date: 03/22/2022 1. Elevated PCWP with prominent V-waves suggestive of severe MR. 2. Normal RA pressure. 3. Pulmonary venous hypertension. 4. Low but not markedly low cardiac output.   ECHO  TEE  Result Date: 03/22/2022    TRANSESOPHOGEAL ECHO REPORT   Patient Name:   Daniel Li Date of Exam: 03/22/2022 Medical Rec #:  [id number]           Height:       66.0 in Accession #:    [id number]          Weight:       146.0 lb Date of Birth:  1956/06/09           BSA:          1.749 m Patient Age:    66 years            BP:           77/61 mmHg Patient Gender: M                   HR:           91 bpm. Exam Location:  Inpatient Procedure: Transesophageal Echo, 3D Echo, Color Doppler and Cardiac Doppler Indications:     Mitral Regurgitation  History:         Patient has prior history of Echocardiogram examinations, most                  recent 02/19/2022. CHF and Cardiomyopathy, Previous Myocardial                  Infarction and CAD, Stroke; Risk Factors:Sleep Apnea and                  Hypertension.  Sonographer:     Bernadene Person RDCS Referring Phys:  [place] Diagnosing Phys: Franki Monte PROCEDURE: After discussion of the risks and benefits of a TEE, an informed consent was obtained from the patient. The transesophogeal probe was passed without difficulty through the esophogus of the patient. Sedation performed by different physician. The patient was monitored while under deep sedation. Anesthestetic sedation was provided intravenously by Anesthesiology: 137.'37mg'$  of Propofol, '100mg'$  of Lidocaine. The patient's vital signs; including heart rate, blood pressure, and oxygen saturation; remained stable throughout the procedure. The patient developed no complications during the procedure. IMPRESSIONS  1. Left ventricular ejection fraction, by estimation, is 20 to 25%. The left ventricle has severely decreased function. The left ventricle demonstrates global hypokinesis. The left ventricular internal cavity size was mildly to moderately dilated.  2. Right ventricular systolic function is mildly reduced. The right ventricular size is normal.  3. Left atrial size was severely dilated. No left  atrial/left atrial appendage thrombus was detected.  4. Right atrial size was mildly dilated.  5. The mitral valve is abnormal with restriction of  the posterior mitral leaflet. Severe mitral regurgitation. PISA ERO 0.38 cm^2, vena contracta area 0.45 cm^2 and 0.37 cm^2, measured twice. No mitral stenosis. The mean mitral valve gradient is 2.0 mmHg, MVA 5.92 cm^2. Posterior leaflet mobile length 16.5 mm. Coaptation length 6 mm. Coaptation depth 15 mm below annulus.  6. Systolic flattening but not flow reversal in the pulmonary vein PW doppler signal.  7. Peak RV-RA gradient 41 mmHg.  8. The aortic valve is tricuspid. Aortic valve regurgitation is not visualized. No aortic stenosis is present.  9. No ASD or PFO by color doppler. FINDINGS  Left Ventricle: Left ventricular ejection fraction, by estimation, is 20 to 25%. The left ventricle has severely decreased function. The left ventricle demonstrates global hypokinesis. The left ventricular internal cavity size was mildly to moderately dilated. There is no left ventricular hypertrophy. Right Ventricle: The right ventricular size is normal. No increase in right ventricular wall thickness. Right ventricular systolic function is mildly reduced. Left Atrium: Left atrial size was severely dilated. No left atrial/left atrial appendage thrombus was detected. Right Atrium: Right atrial size was mildly dilated. Pericardium: There is no evidence of pericardial effusion. Mitral Valve: The mitral valve is abnormal. Severe mitral valve regurgitation. Severe mitral valve stenosis. MV peak gradient, 5.4 mmHg. The mean mitral valve gradient is 2.0 mmHg. Tricuspid Valve: Peak RV-RA gradient 41 mmHg. The tricuspid valve is normal in structure. Tricuspid valve regurgitation is trivial. Aortic Valve: The aortic valve is tricuspid. Aortic valve regurgitation is not visualized. No aortic stenosis is present. Pulmonic Valve: The pulmonic valve was normal in structure. Pulmonic valve  regurgitation is trivial. Aorta: The aortic root is normal in size and structure. Venous: Systolic flattening but not flow reversal in the pulmonary vein PW doppler signal. IAS/Shunts: No ASD or PFO by color doppler. Additional Comments: A device lead is visualized in the right ventricle.  AORTIC VALVE LVOT Vmax:   43.20 cm/s LVOT Vmean:  27.700 cm/s LVOT VTI:    0.070 m MITRAL VALVE MV Peak grad: 5.4 mmHg        SHUNTS MV Mean grad: 2.0 mmHg        Systemic VTI: 0.07 m MV Vmax:      1.16 m/s MV Vmean:     55.7 cm/s MR Peak grad:    58.7 mmHg MR Mean grad:    34.0 mmHg MR Vmax:         383.00 cm/s MR Vmean:        273.0 cm/s MR PISA:         5.09 cm MR PISA Eff ROA: 48 mm MR PISA Radius:  0.90 cm Hilbert Briggs McleanMD Electronically signed by Franki Monte Signature Date/Time: 03/22/2022/2:03:46 PM    Final (Updated)      Medications:     Scheduled Medications:  clopidogrel  75 mg Oral Daily   digoxin  0.125 mg Oral Daily   empagliflozin  10 mg Oral Daily   enoxaparin (LOVENOX) injection  40 mg Subcutaneous Q24H   ezetimibe  10 mg Oral Daily   fenofibrate  160 mg Oral Daily   gabapentin  100 mg Oral BID   metFORMIN  1,000 mg Oral BID WC   pantoprazole  40 mg Oral Daily   potassium chloride  10 mEq Oral QODAY   sodium chloride flush  3 mL Intravenous Q12H   sodium chloride flush  3 mL Intravenous Q12H   sodium chloride flush  3 mL Intravenous Q12H   spironolactone  12.5 mg  Oral Daily   torsemide  20 mg Oral QODAY    Infusions:  sodium chloride Stopped (03/22/22 0915)   sodium chloride Stopped (03/22/22 0940)   sodium chloride Stopped (03/22/22 1504)   sodium chloride Stopped (03/22/22 1653)   sodium chloride     sodium chloride      PRN Medications: sodium chloride, sodium chloride, sodium chloride, acetaminophen, ondansetron (ZOFRAN) IV, sodium chloride flush, sodium chloride flush, sodium chloride flush    Patient Profile   66 y.o. male with history of chronic systolic  CHF/ischemic cardiomyopathy, CAD, mitral regurgitation, DM, hx LV thrombus, hx ETOH and tobacco use.    Admitted to complete workup to assess mitral valve regurgitation including TEE/RHC >>considering mTEER.   Assessment/Plan   1. Chronic systolic CHF: Ischemic cardiomyopathy, [employer] Jude ICD. Echo in 10/19 with EF 20-25%.  Echo in 5/22 with EF 25%, normal RV.  Echo 1/23 showed EF 25%. R/LHC in 1/23 at [facility] showed no new CAD, preserved CO and low PCWP suggesting over-diuresis. PYP scan obtained and did not suggest TTR amyloidosis. Echo 04/23 showed EF 20-25% with mild LV dilation, no LV thrombus, moderate-severe MR with ERO 0.39 cm^2 by PISA, normal RV, PASP 49. RHC today RA mean 2, PA 49/12 (31), PCWP mean 21 with prominent v waves to 45 suggesting severe MR, Fick CO/CI 3.81/2.18, PA 66%. See below regarding workup for MR. - Meds adjusted frequently with AKI and low BP.  - Continue spironolactone 12.5 mg daily.  - Continue Jardiance 10 mg daily.  - Continue Torsemide 20 mg every other day - Hold beta blocker with low-output - No BP room for Entresto.  If creatinine and BP remain stable, may be able to add low dose valsartan in future.  - Continue digoxin 0.125 daily but must follow creatinine closely.  2. Mitral regurgitation: Echo 04/23 showed moderate-severe likely functional MR.  He may benefit from Mitraclip procedure.   - TEE 5/25 with moderate to severe MR - Elevated PCWP with prominent V waves on RHC suggestive of severe MR - Structural heart team consulted. Will see to assess for MitraClip  3. CAD:  Extensive prior history of CAD.  He has known total occlusion LCx and PLV.  NSTEMI 10/19 with DES to mid LAD.  Coronary angiography in 9/21 showed stable coronary disease, no intervention was done.  Admission 1/23 concerning for ACS, however LHC showed patent stents, no new stents placed. Warfarin was stopped and continued on clopidogrel. Had CP overnight w/ 15 beat run of NSVT. EKG w/ lateral ST  depressions, c/w previous though a bit more pronounced - check HS trop  - Continue clopidogrel.   - No statin given history of rhabdomyolysis on statin.  He is now on Repatha and Zetia. Lipids ok 1/23.  4. Diabetes: Taking SGLT2i. 5. Smoking: He has quit.  6. PAD: No claudication, foot ulcers. ABIs in 10/21 were normal.    7. LV thrombus:  Echo 04/23 showed no LV thrombus.  Warfarin was stopped during one of his admissions at [facility]. No LV thrombus on TEE this admit Will leave off for now.  8. 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  - As above, off Coumadin and on clopidogrel. 9. ETOH use: Advised complete cessation. 10. NSVT: Coreg d/c w/ marginal output and soft BP. K and Mg stable - start amio 200 mg bid x 10 days>>200 mg daily    Length of Stay: 0  Brittainy Simmons, PA-C  03/23/2022, 9:00 AM  Advanced Heart  Failure Team Pager [phone] (M-F; 7a - 5p)  Please contact [facility] for night-coverage after hours (5p -7a ) and weekends on [email or web address]  Patient seen with PA, agree with the above note.   SBP remains 80s-90s, he denies lightheadedness.  Main complaint is right flank pain that radiates up into the chest.  He is tender to palpation throughout the area.   PVCs and short NSVT run noted.   General: NAD Neck: JVP 7-8 cm, no thyromegaly or thyroid nodule.  Lungs: Clear to auscultation bilaterally with normal respiratory effort. CV: Lateral PMI.  Heart regular S1/S2, no S3/S4, 2/6 HSM apex.  No peripheral edema.   Abdomen: Soft, nontender, no hepatosplenomegaly, no distention.  Skin: Intact without lesions or rashes.  Neurologic: Alert and oriented x 3.  Psych: Normal affect. Extremities: No clubbing or cyanosis.  HEENT: Normal.   Patient was kept in hospital after procedures last night as he did not have anyone to stay with him.   I am not sure what to make of the chest pain.  He is tender throughout right flank/right chest/central chest.  Denies falls, etc.   No bruising.  - I will get a CXR.  - Troponin sent but do not think ACS.   Coreg stopped with soft BP.  With ventricular arrhythmias, I am going to start him on amiodarone as above.   Severe functional MR, will have evaluation by structural heart team for Mitraclip.  Due to transportation difficulties and social issues, will try to have evaluation here before he goes home.   Plan for discharge after structural heart evaluation.   [name] 03/23/2022 9:34 AM

## 2022-03-23 NOTE — Telephone Encounter (Signed)
Pt has appt on 05/31 for PHYS PACER CK, but is being released from the hospital today. They would like to know if the pt should keep this appt or reschedule. Please advise.

## 2022-03-23 NOTE — Care Management Obs Status (Signed)
[facility] NOTIFICATION   Patient Details  Name: Jahlen Bollman Eppinger MRN: [id number] Date of Birth: [birth year]   Medicare Observation Status Notification Given:  Yes    Verdell Carmine, RN 03/23/2022, 11:34 AM

## 2022-03-23 NOTE — Progress Notes (Signed)
   03/23/22 0540  Cardiac  Cardiac (WDL) X  Pulse Irregular  Heart Sounds S1, S2;S3  Jugular Venous Distention (JVD) No  ECG Monitor Yes  ECG Monitoring  Ectopy Ventricular tachycardia, non-sustained (15 BEAT RUN, per telemetry)  Neurological  Level of Consciousness Alert     Primary RN informed of 15 beat run of VTACH, patient denies any CP, dizziness or SOB. Primary team informed. Will continue to monitor.

## 2022-03-23 NOTE — Progress Notes (Signed)
Report given to receiving facility.

## 2022-03-23 NOTE — Telephone Encounter (Signed)
Spoke with L[facility] Frohlich at [facility] informed her that patient doe need to keep appointment on 03/28/2022

## 2022-03-23 NOTE — Discharge Summary (Signed)
Advanced Heart Failure Team  Discharge Summary   Patient ID: Daniel Li MRN: [id number], DOB/AGE: April 06, 1956 66 y.o. 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 male with long history of CAD s/p multiple ACS episodes, HTN, HLD, tobacco abuse, DM, OSA, CVA and systolic CHF.    He has a history of CAD with NSTEMI in 2007 with BMS placement to his D1 and OM2. Also with history of STEMI in May 2013 at which time he had a BMS placed to his RCA. NSTEMI in January of 2014, cath showed CTO of left circumflex, unable to intervene upon. EF 55-60% in September 2016. Anterior STEMI 4/18, DES to pLAD.    Admitted 8/9 - 06/11/17 with chest pain. No targets for PCI, LAD stent patent. EF remained low at 20-25% as below. Now with [employer] ICD.    Admitted 4/16 - 02/17/2018 with NSTEMI. Underwent LHC again with unchanged anatomy, no intervention. Discharged back to assisted living.    He was admitted again in 10/19 with NSTEMI.  LHC showed totally occluded PLV, 75% PDA stenosis, 80% mid LAD, occluded LCx.  He had DES to mLAD. Repeat echo showed EF 20-25%.    RHC/LHC in 9/21 showed unchanged coronaries with occluded PLV and proximal LCx with collaterals, patent LAD, 60% D2, 60% PDA. Normal filling pressures and relatively preserved cardiac output.    Echo in 5/22 showed EF 25% with mild LV dilation, no LV thrombus, normal RV, mild MR.    Admitted 11/05/21 at [facility] for hypotension and AKI. Echo ordered but not completed. Received IVF and beta blocker and losartan stopped at discharge.    Admitted 11/20/21 to [facility] with CP, concern for ACS. R/LHC with 3-vessel disease with patent stents (no intervention) CO 3.0 and low PWCP at 7 mmHg. Echo showed  EF~25%, no evidence of thrombus, warfarin stopped and he was transitioned to Plavix. Dig, losartan and spiro stopped due to AKI. PYP scan obtained and showed no evidence of TTR amyloidosis.    Admitted to [facility] 2/23 with CP and fall. Ethanol on admission 184. Troponin elevated but felt to be in setting of ETOH use and fall.    Echo 04/23: EF 20-25% with mild LV dilation, no LV thrombus, moderate-severe MR with ERO 0.39 cm^2 by PISA, normal RV, PASP 49.    Presented for TEE and RHC on 5/25 as part of workup for consideration of MitraClip.  TEE with evidence of moderate to severe MR. RHC with elevated PCWP and prominent v-waves suggestive of severe MR, normal RA pressure, pulmonary venous HTN, low CO.   He was direct admitted for overnight observation, given he has no one at home to stay with him post procedure. While admitted, Structural heart team started assessment for MitraClip. They will continue to follow in their clinic and have recommended teeth extraction prior to potential implant. They have arranged consultation w/ oral surgery, scheduled 6/29.   Also of note, given marginal CO on cath and soft BP, Coreg was discontinued. Digoxin 0.125 mg added. He was noted to have 15 beat run of NSVT on tele. K and Mg WNL. He was started on Amiodarone 200 mg bid, to continue x 10 days, then reduce  to 200 mg once daily.   He also noted atypical CP that is chronic. HS trop 23. EKG unchanged from baseline. Exam + for muscle wall tenderness, c/w musculoskeletal etiology. CXR unremarkable.   On 5/26, he was last seen and examined by Dr. Aundra Dubin and felt stable for discharge home. Post hospital f/u arranged in the [facility] on 6/8. He will be followed in the structural heart clinic. Meds sent to [place].    Cardiac Studies   RHC 03/22/22 RHC Procedural Findings: Hemodynamics (mmHg) RA mean 2 RV 49/12 PA 49/12, mean 31 PCWP mean 21 with prominent V-waves to 45 Oxygen saturations: PA 66% AO 99% Cardiac  Output (Fick) 3.81  Cardiac Index (Fick) 2.18 PVR 2.6 WU  TEE 5.25.23 Left ventricular ejection fraction, by estimation, is 20 to 25%. The left ventricle has severely decreased function. The left ventricle demonstrates global hypokinesis. The left ventricular internal cavity size was mildly to moderately dilated. 1. Right ventricular systolic function is mildly reduced. The right ventricular size is normal. 2. Left atrial size was severely dilated. No left atrial/left atrial appendage thrombus was detected. 3. 4. Right atrial size was mildly dilated. The mitral valve is abnormal with restriction of the posterior mitral leaflet. Severe mitral regurgitation. PISA ERO 0.38 cm^2, vena contracta area 0.45 cm^2 and 0.37 cm^2, measured twice. No mitral stenosis. The mean mitral valve gradient is 2.0 mmHg, MVA 5.92 cm^2. Posterior leaflet mobile length 16.5 mm. Coaptation length 6 mm. Coaptation depth 15 mm below annulus. 5. 6. Systolic flattening but not flow reversal in the pulmonary vein PW doppler signal. 7. Peak RV-RA gradient 41 mmHg.     Discharge Weight Range: 141 lb  Discharge Vitals: Blood pressure 92/68, pulse 72, temperature 98.2 F (36.8 C), temperature source Oral, resp. rate 16, height 5' 6.5" (1.689 m), weight 64 kg, SpO2 98 %.  Labs: Lab Results  Component Value Date   WBC 6.0 03/23/2022   HGB 12.8 (L) 03/23/2022   HCT 39.8 03/23/2022   MCV 76.5 (L) 03/23/2022   PLT 207 03/23/2022    Recent Labs  Lab 03/23/22 0414  NA 140  K 4.1  CL 107  CO2 27  BUN 11  CREATININE 1.03  CALCIUM 9.7  GLUCOSE 103*   Lab Results  Component Value Date   CHOL 103 06/21/2021   HDL 50 06/21/2021   LDLCALC 24 06/21/2021   TRIG 147 06/21/2021   BNP (last 3 results) Recent Labs    11/14/21 1525 12/08/21 1224 02/19/22 1211  BNP 271.3* 280.6* 542.9*    ProBNP (last 3 results) No results for input(s): PROBNP in the last 8760 hours.   Diagnostic Studies/Procedures    DG Chest 2 View  Result Date: 03/23/2022 CLINICAL DATA:  Pneumonia. EXAM: CHEST - 2 VIEW COMPARISON:  December 26, 2021. FINDINGS: Stable cardiomediastinal silhouette. Single lead left-sided defibrillator is unchanged in position. Lungs are clear. Bony thorax is unremarkable. IMPRESSION: No active cardiopulmonary disease. Electronically Signed   By: Marijo Conception M.D.   On: 03/23/2022 10:49   CARDIAC CATHETERIZATION  Result Date: 03/22/2022 1. Elevated PCWP with prominent V-waves suggestive of severe MR. 2. Normal RA pressure. 3. Pulmonary venous hypertension. 4. Low but not markedly low cardiac output.   ECHO TEE  Result Date: 03/22/2022    TRANSESOPHOGEAL ECHO REPORT   Patient Name:   Daniel Li Date of Exam: 03/22/2022 Medical Rec #:  [id number]           Height:  66.0 in Accession #:    [id number]          Weight:       146.0 lb Date of Birth:  04/14/1956           BSA:          1.749 m Patient Age:    3 years            BP:           77/61 mmHg Patient Gender: M                   HR:           91 bpm. Exam Location:  Inpatient Procedure: Transesophageal Echo, 3D Echo, Color Doppler and Cardiac Doppler Indications:     Mitral Regurgitation  History:         Patient has prior history of Echocardiogram examinations, most                  recent 02/19/2022. CHF and Cardiomyopathy, Previous Myocardial                  Infarction and CAD, Stroke; Risk Factors:Sleep Apnea and                  Hypertension.  Sonographer:     Bernadene Person RDCS Referring Phys:  [place] Diagnosing Phys: Franki Monte PROCEDURE: After discussion of the risks and benefits of a TEE, an informed consent was obtained from the patient. The transesophogeal probe was passed without difficulty through the esophogus of the patient. Sedation performed by different physician. The patient was monitored while under deep sedation. Anesthestetic sedation was provided intravenously by Anesthesiology: 137.'37mg'$   of Propofol, '100mg'$  of Lidocaine. The patient's vital signs; including heart rate, blood pressure, and oxygen saturation; remained stable throughout the procedure. The patient developed no complications during the procedure. IMPRESSIONS  1. Left ventricular ejection fraction, by estimation, is 20 to 25%. The left ventricle has severely decreased function. The left ventricle demonstrates global hypokinesis. The left ventricular internal cavity size was mildly to moderately dilated.  2. Right ventricular systolic function is mildly reduced. The right ventricular size is normal.  3. Left atrial size was severely dilated. No left atrial/left atrial appendage thrombus was detected.  4. Right atrial size was mildly dilated.  5. The mitral valve is abnormal with restriction of the posterior mitral leaflet. Severe mitral regurgitation. PISA ERO 0.38 cm^2, vena contracta area 0.45 cm^2 and 0.37 cm^2, measured twice. No mitral stenosis. The mean mitral valve gradient is 2.0 mmHg, MVA 5.92 cm^2. Posterior leaflet mobile length 16.5 mm. Coaptation length 6 mm. Coaptation depth 15 mm below annulus.  6. Systolic flattening but not flow reversal in the pulmonary vein PW doppler signal.  7. Peak RV-RA gradient 41 mmHg.  8. The aortic valve is tricuspid. Aortic valve regurgitation is not visualized. No aortic stenosis is present.  9. No ASD or PFO by color doppler. FINDINGS  Left Ventricle: Left ventricular ejection fraction, by estimation, is 20 to 25%. The left ventricle has severely decreased function. The left ventricle demonstrates global hypokinesis. The left ventricular internal cavity size was mildly to moderately dilated. There is no left ventricular hypertrophy. Right Ventricle: The right ventricular size is normal. No increase in right ventricular wall thickness. Right ventricular systolic function is mildly reduced. Left Atrium: Left atrial size was severely dilated. No left atrial/left atrial appendage thrombus was  detected. Right Atrium: Right  atrial size was mildly dilated. Pericardium: There is no evidence of pericardial effusion. Mitral Valve: The mitral valve is abnormal. Severe mitral valve regurgitation. Severe mitral valve stenosis. MV peak gradient, 5.4 mmHg. The mean mitral valve gradient is 2.0 mmHg. Tricuspid Valve: Peak RV-RA gradient 41 mmHg. The tricuspid valve is normal in structure. Tricuspid valve regurgitation is trivial. Aortic Valve: The aortic valve is tricuspid. Aortic valve regurgitation is not visualized. No aortic stenosis is present. Pulmonic Valve: The pulmonic valve was normal in structure. Pulmonic valve regurgitation is trivial. Aorta: The aortic root is normal in size and structure. Venous: Systolic flattening but not flow reversal in the pulmonary vein PW doppler signal. IAS/Shunts: No ASD or PFO by color doppler. Additional Comments: A device lead is visualized in the right ventricle.  AORTIC VALVE LVOT Vmax:   43.20 cm/s LVOT Vmean:  27.700 cm/s LVOT VTI:    0.070 m MITRAL VALVE MV Peak grad: 5.4 mmHg        SHUNTS MV Mean grad: 2.0 mmHg        Systemic VTI: 0.07 m MV Vmax:      1.16 m/s MV Vmean:     55.7 cm/s MR Peak grad:    58.7 mmHg MR Mean grad:    34.0 mmHg MR Vmax:         383.00 cm/s MR Vmean:        273.0 cm/s MR PISA:         5.09 cm MR PISA Eff ROA: 48 mm MR PISA Radius:  0.90 cm Dalton McleanMD Electronically signed by Franki Monte Signature Date/Time: 03/22/2022/2:03:46 PM    Final (Updated)     Discharge Medications   Allergies as of 03/23/2022       Reactions   Contrast Media [iodinated Contrast Media] Other (See Comments)   Does not eat for religious reasons - okay with using IV heparin   Pork-derived Products Other (See Comments)   Does not eat for religious reasons - okay with using IV heparin        Medication List     STOP taking these medications    carvedilol 3.125 MG tablet Commonly known as: Coreg       TAKE these medications     acetaminophen 325 MG tablet Commonly known as: TYLENOL Take 650 mg by mouth every 6 (six) hours as needed for mild pain, fever or headache.   amiodarone 200 MG tablet Commonly known as: PACERONE Take 1 tablet (200 mg total) by mouth 2 (two) times daily.   CeraVe SA Rough & Bumpy Skin Lotn Apply 1 application. topically 2 (two) times daily. Apply to both feet   clopidogrel 75 MG tablet Commonly known as: PLAVIX Take 75 mg by mouth daily.   diclofenac Sodium 1 % Gel Commonly known as: VOLTAREN Apply 2 g topically 4 (four) times daily as needed (pain).   digoxin 0.125 MG tablet Commonly known as: LANOXIN Take 1 tablet (0.125 mg total) by mouth daily.   empagliflozin 10 MG Tabs tablet Commonly known as: JARDIANCE Take 10 mg by mouth daily.   ezetimibe 10 MG tablet Commonly known as: Zetia Take 1 tablet (10 mg total) by mouth daily.   fenofibrate 145 MG tablet Commonly known as: TRICOR TAKE ONE TABLET BY MOUTH EVERY DAY.   gabapentin 100 MG capsule Commonly known as: NEURONTIN Take 100 mg by mouth 2 (two) times daily.   metFORMIN 500 MG tablet Commonly known as: GLUCOPHAGE Take 1,000 mg by mouth  2 (two) times daily with a meal.   nitroGLYCERIN 0.4 MG SL tablet Commonly known as: NITROSTAT PLACE ONE (1) TABLET UNDER TONGUE EVERY 5 MINUTES UP TO (3) DOSES AS NEEDED FOR CHEST PAIN. IF NO RELIEF, CONTACT MD.   pantoprazole 40 MG tablet Commonly known as: PROTONIX Take 40 mg by mouth daily.   potassium chloride 10 MEQ tablet Commonly known as: KLOR-CON Take 1 tablet (10 mEq total) by mouth every other day. Start taking on: Mar 26, 3427   Repatha SureClick 768 MG/ML Soaj Generic drug: Evolocumab INJECT CONTENTS OF ONE PREFILLED PEN SUBCUTANEOUSLY INTO THE SKIN EVERY 14 DAYS. What changed: See the new instructions.   spironolactone 25 MG tablet Commonly known as: ALDACTONE Take 0.5 tablets (12.5 mg total) by mouth daily.   torsemide 20 MG tablet Commonly  known as: DEMADEX Take 1 tablet (20 mg total) by mouth every other day. Start taking on: Mar 25, 2022 What changed:  when to take this additional instructions        Disposition   The patient will be discharged in stable condition to home.   Follow-up Information     [facility] Follow up.   Specialty: Cardiology Why: 04/05/22 at 2:00 PM Hospital Follow up in the [facility] at [facility], Entrance C (Dr. Claris Gladden Office) Contact information: [address] [id number] [place] [phone]                  Duration of Discharge Encounter: Greater than 35 minutes   Signed, Nelida Gores  03/23/2022, 11:36 AM
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
## 2022-03-27 ENCOUNTER — Telehealth (HOSPITAL_COMMUNITY): Payer: Self-pay | Admitting: Licensed Clinical Social Worker

## 2022-03-27 ENCOUNTER — Telehealth: Payer: Self-pay

## 2022-03-27 NOTE — Telephone Encounter (Signed)
CSW received message from inpatient [facility] CSW that pt would need help arranging transportation to his f/up appt in our clinic on 6/8.  CSW called [facility] where pt arrives and was able to confirm with them that they are aware of appt and will arrange transportation for him to get here.  No further needs at this time  Jorge Ny, [place] [facility] Desk#: [phone] Cell#: [phone]

## 2022-03-27 NOTE — Telephone Encounter (Signed)
Per [employer] review of 03/22/2022 TEE:  "For patient ED: This is mixed MR   This valve looks suitable for a MitraClip. The fossa looks approachable for transseptal puncture in the SAXB and Bicaval views. TR and pacing leads noted. LA dimensions are large enough for device steering and straddle. The MR jet is broad based and centrally. There is a posterior flail seen at 90, but the MR is mostly due to [facility]. The posterior leaflet measures 1.62 cm in the 120 LVOT grasping view. MVA measures about 5.92 cm2; Gradient measures 76mHg (HR 78). I'd recommend starting with an NTW and assessing for gradient. "

## 2022-03-28 ENCOUNTER — Encounter: Payer: Medicare Other | Admitting: Physician Assistant

## 2022-03-28 LAB — LIPOPROTEIN A (LPA): Lipoprotein (a): 231.9 nmol/L — ABNORMAL HIGH (ref <75.0)

## 2022-04-04 ENCOUNTER — Telehealth (HOSPITAL_COMMUNITY): Payer: Self-pay

## 2022-04-04 NOTE — Telephone Encounter (Signed)
Called and spoke to patient's caregiver to confirm/remind patient of their appointment at the [facility] on 04/05/22.   Patient reminded to bring all medications and/or complete list.  Confirmed patient has transportation. Gave directions, instructed to utilize [place] parking.  Confirmed appointment prior to ending call.

## 2022-04-05 ENCOUNTER — Encounter (HOSPITAL_COMMUNITY): Payer: Self-pay

## 2022-04-05 ENCOUNTER — Ambulatory Visit (HOSPITAL_COMMUNITY)
Admission: RE | Admit: 2022-04-05 | Discharge: 2022-04-05 | Disposition: A | Discharge status: Home / Self Care | Payer: Medicare Other | Source: Ambulatory Visit | Attending: Physician Assistant | Admitting: Physician Assistant

## 2022-04-05 VITALS — BP 120/90 | HR 73 | Wt 140.4 lb

## 2022-04-05 DIAGNOSIS — Z7984 Long term (current) use of oral hypoglycemic drugs: Secondary | ICD-10-CM | POA: Diagnosis not present

## 2022-04-05 DIAGNOSIS — I252 Old myocardial infarction: Secondary | ICD-10-CM | POA: Diagnosis not present

## 2022-04-05 DIAGNOSIS — Z823 Family history of stroke: Secondary | ICD-10-CM | POA: Diagnosis not present

## 2022-04-05 DIAGNOSIS — I472 Ventricular tachycardia, unspecified: Secondary | ICD-10-CM | POA: Insufficient documentation

## 2022-04-05 DIAGNOSIS — I110 Hypertensive heart disease with heart failure: Secondary | ICD-10-CM | POA: Insufficient documentation

## 2022-04-05 DIAGNOSIS — I2510 Atherosclerotic heart disease of native coronary artery without angina pectoris: Secondary | ICD-10-CM | POA: Diagnosis not present

## 2022-04-05 DIAGNOSIS — Z8673 Personal history of transient ischemic attack (TIA), and cerebral infarction without residual deficits: Secondary | ICD-10-CM | POA: Insufficient documentation

## 2022-04-05 DIAGNOSIS — I5022 Chronic systolic (congestive) heart failure: Secondary | ICD-10-CM | POA: Diagnosis present

## 2022-04-05 DIAGNOSIS — I340 Nonrheumatic mitral (valve) insufficiency: Secondary | ICD-10-CM | POA: Insufficient documentation

## 2022-04-05 DIAGNOSIS — I255 Ischemic cardiomyopathy: Secondary | ICD-10-CM | POA: Insufficient documentation

## 2022-04-05 DIAGNOSIS — I739 Peripheral vascular disease, unspecified: Secondary | ICD-10-CM | POA: Diagnosis not present

## 2022-04-05 DIAGNOSIS — Z87891 Personal history of nicotine dependence: Secondary | ICD-10-CM | POA: Diagnosis not present

## 2022-04-05 DIAGNOSIS — Z955 Presence of coronary angioplasty implant and graft: Secondary | ICD-10-CM | POA: Insufficient documentation

## 2022-04-05 DIAGNOSIS — I4729 Other ventricular tachycardia: Secondary | ICD-10-CM

## 2022-04-05 DIAGNOSIS — Z7902 Long term (current) use of antithrombotics/antiplatelets: Secondary | ICD-10-CM | POA: Insufficient documentation

## 2022-04-05 DIAGNOSIS — E119 Type 2 diabetes mellitus without complications: Secondary | ICD-10-CM | POA: Diagnosis not present

## 2022-04-05 LAB — COMPREHENSIVE METABOLIC PANEL
ALT: 16 U/L (ref 0–44)
AST: 37 U/L (ref 15–41)
Albumin: 4.1 g/dL (ref 3.5–5.0)
Alkaline Phosphatase: 37 U/L — ABNORMAL LOW (ref 38–126)
Anion gap: 7 (ref 5–15)
BUN: 12 mg/dL (ref 8–23)
CO2: 28 mmol/L (ref 22–32)
Calcium: 9.6 mg/dL (ref 8.9–10.3)
Chloride: 107 mmol/L (ref 98–111)
Creatinine, Ser: 1.46 mg/dL — ABNORMAL HIGH (ref 0.61–1.24)
GFR, Estimated: 53 mL/min — ABNORMAL LOW (ref >60)
Glucose, Bld: 113 mg/dL — ABNORMAL HIGH (ref 70–99)
Potassium: 4.4 mmol/L (ref 3.5–5.1)
Sodium: 142 mmol/L (ref 135–145)
Total Bilirubin: 0.4 mg/dL (ref 0.3–1.2)
Total Protein: 6.9 g/dL (ref 6.5–8.1)

## 2022-04-05 LAB — TSH: TSH: 1.595 u[IU]/mL (ref 0.350–4.500)

## 2022-04-05 LAB — T4, FREE: Free T4: 0.96 ng/dL (ref 0.61–1.12)

## 2022-04-05 MED ORDER — AMIODARONE HCL 200 MG PO TABS: 200.0000 mg | ORAL_TABLET | Freq: Every day | ORAL | 3 refills | Status: DC

## 2022-04-05 NOTE — Progress Notes (Signed)
PCP: [name], NP Primary Cardiologist: Dr. Harl Bowie HF: Dr. Aundra Dubin  HPI:  Daniel Li is a 66 y.o. male with long history of CAD s/p multiple ACS episodes, HTN, HLD, tobacco abuse, DM, OSA, CVA and systolic CHF.    He has a history of CAD with NSTEMI in 2007 with BMS placement to his D1 and OM2. Also with history of STEMI in May 2013 at which time he had a BMS placed to his RCA. NSTEMI in January of 2014, cath showed CTO of left circumflex, unable to intervene upon. EF 55-60% in September 2016. Anterior STEMI 4/18, DES to pLAD.    Admitted 8/9 - 06/11/17 with chest pain. No targets for PCI, LAD stent patent. EF remained low at 20-25% as below. Now with St Jude ICD.    Admitted 4/16 - 02/17/2018 with NSTEMI. Underwent LHC again with unchanged anatomy, no intervention. Discharged back to assisted living.    He was admitted again in 10/19 with NSTEMI.  LHC showed totally occluded PLV, 75% PDA stenosis, 80% mid LAD, occluded LCx.  He had DES to mLAD. Repeat echo showed EF 20-25%.    RHC/LHC in 9/21 showed unchanged coronaries with occluded PLV and proximal LCx with collaterals, patent LAD, 60% D2, 60% PDA. Normal filling pressures and relatively preserved cardiac output.    Echo in 5/22 showed EF 25% with mild LV dilation, no LV thrombus, normal RV, mild MR.    Admitted 11/05/21 at [facility] for hypotension and AKI. Echo ordered but not completed. Received IVF and beta blocker and losartan stopped at discharge.    Admitted 11/20/21 to [facility] with CP, concern for ACS. R/LHC with 3-vessel disease with patent stents (no intervention) CO 3.0 and low PWCP at 7 mmHg. Echo showed EF~25%, no evidence of thrombus, warfarin stopped and he was transitioned to Plavix. Dig, losartan and spiro stopped due to AKI. PYP scan obtained and showed no evidence of TTR amyloidosis.    Admitted to [facility] 2/23 with CP and fall. Ethanol on admission 184. Troponin elevated but felt to be in setting of ETOH use and fall.    Echo  04/23: EF 20-25% with mild LV dilation, no LV thrombus, moderate-severe MR with ERO 0.39 cm^2 by PISA, normal RV, PASP 49.    Presented for TEE and RHC on 5/25 as part of workup for consideration of MitraClip.  TEE with evidence of moderate to severe MR. RHC with elevated PCWP and prominent v-waves suggestive of severe MR, normal RA pressure, pulmonary venous HTN, low CO.    He was direct admitted for overnight observation, given he has no one at home to stay with him post procedure. While admitted, Structural heart team started assessment for MitraClip. They will continue to follow in their clinic and have recommended teeth extraction prior to potential implant. They have arranged consultation w/ oral surgery, scheduled 6/29.    Given marginal CO on cath and soft BP, Coreg was discontinued. Digoxin 0.125 mg added. He was noted to have 15 beat run of NSVT on tele. He was started on Amiodarone 200 mg bid, to continue x 10 days, then reduce to 200 mg once daily.   He is here today for hospital follow-up.  He has been feeling well. Reports he was more short of breath than usual and had some chest tightness due to smoke outside from [employer]. He states that he goes for walks around the assisted living facility, typically about 0.5 miles. Usually has to stop and rest  at least once d/t fatigue. No orthopnea, PND or LE edema. Reports his BP is usually 026-378 systolic.   ROS: All systems negative except as listed in HPI, PMH and Problem List.  SH:  Social History   Socioeconomic History   Marital status: Single    Spouse name: Not on file   Number of children: 0   Years of education: 9   Highest education level: 9th grade  Occupational History   Not on file  Tobacco Use   Smoking status: Some Days    Packs/day: 0.50    Years: 20.00    Total pack years: 10.00    Types: Cigarettes, Cigars    Start date: 07/21/1977   Smokeless tobacco: Never  Vaping Use   Vaping Use: Never used   Substance and Sexual Activity   Alcohol use: Yes    Comment: 09/17/2017 "nothing since 05/2017"   Drug use: Not Currently    Comment: previously incarcerated for drug related offense.   Sexual activity: Not Currently  Other Topics Concern   Not on file  Social History Narrative   Not on file   Social Determinants of Health   Financial Resource Strain: Medium Risk (10/01/2018)   Overall Financial Resource Strain (CARDIA)    Difficulty of Paying Living Expenses: Somewhat hard  Food Insecurity: Food Insecurity Present (10/01/2018)   Hunger Vital Sign    Worried About Running Out of Food in the Last Year: Never true    Ran Out of Food in the Last Year: Sometimes true  Transportation Needs: Unmet Transportation Needs (10/01/2018)   PRAPARE - Hydrologist (Medical): Yes    Lack of Transportation (Non-Medical): No  Physical Activity: Not on file  Stress: Not on file  Social Connections: Not on file  Intimate Partner Violence: Not on file    FH:  Family History  Problem Relation Age of Onset   Stroke Mother    Heart attack Mother    Heart attack Father    Stroke Sister    Heart attack Sister    Heart attack Brother    Stroke Brother    Liver disease Neg Hx    Colon cancer Neg Hx     Past Medical History:  Diagnosis Date   AICD (automatic cardioverter/defibrillator) present    Anxiety    Arthritis    "all over" (09/17/2017)   Burn    CAD (coronary artery disease)    a. NSTEMI s/p BMS to 1st Diagonal and distal OM2 in 2007; b. STEMI 03/26/12 s/p BMS to RCA; c. NSTEMI 10/2012 : CTO of LCx (unable to open with PCI) and PL branch, mod dz of LAD and diagonal, and preserved LV systolic fxn, Med Rx;  d.  anterior STEMI in 01/2017 with DES to Proximal LAD   CAD in native artery 08/23/2020   Cardiomyopathy EF 35% on cath 06/30/14, new from jan 2015 07/20/2014   Chest pain    CHF (congestive heart failure) (HCC)    Chronic back pain    "all over"  (09/17/2017)   DDD (degenerative disc disease), cervical    Depression    GERD (gastroesophageal reflux disease)    Headache    "a few/wk" (09/17/2017)   HTN (hypertension)    Hypercholesterolemia    Mental disorder    Myocardial infarction ([place])    "I've had 7" (09/17/2017)   Pulmonary edema    Respiratory failure (HCC)    Sciatic pain  Sleep apnea    Stroke [facility])    a. multiple dating back to 2002; *I''ve had 5; LUE/LLE weaker since" (09/17/2017)   Tibia fracture (l) leg   Tobacco abuse    Type 2 diabetes mellitus (HCC)    Unstable angina (HCC)     Current Outpatient Medications  Medication Sig Dispense Refill   acetaminophen (TYLENOL) 325 MG tablet Take 650 mg by mouth every 6 (six) hours as needed for mild pain, fever or headache.      clopidogrel (PLAVIX) 75 MG tablet Take 75 mg by mouth daily.     diclofenac Sodium (VOLTAREN) 1 % GEL Apply 2 g topically 4 (four) times daily as needed (pain).     digoxin (LANOXIN) 0.125 MG tablet Take 1 tablet (0.125 mg total) by mouth daily. 30 tablet 3   Emollient (CERAVE SA ROUGH & BUMPY SKIN) LOTN Apply 1 application. topically 2 (two) times daily. Apply to both feet     empagliflozin (JARDIANCE) 10 MG TABS tablet Take 10 mg by mouth daily.     Evolocumab (REPATHA SURECLICK) 875 MG/ML SOAJ INJECT CONTENTS OF ONE PREFILLED PEN SUBCUTANEOUSLY INTO THE SKIN EVERY 14 DAYS. (Patient taking differently: Inject 140 mg into the skin every 14 (fourteen) days.) 2 mL 11   ezetimibe (ZETIA) 10 MG tablet Take 1 tablet (10 mg total) by mouth daily. 30 tablet 3   fenofibrate (TRICOR) 145 MG tablet TAKE ONE TABLET BY MOUTH EVERY DAY. 30 tablet 11   gabapentin (NEURONTIN) 100 MG capsule Take 100 mg by mouth 2 (two) times daily.     metFORMIN (GLUCOPHAGE) 500 MG tablet Take 1,000 mg by mouth 2 (two) times daily with a meal.     nitroGLYCERIN (NITROSTAT) 0.4 MG SL tablet PLACE ONE (1) TABLET UNDER TONGUE EVERY 5 MINUTES UP TO (3) DOSES AS NEEDED FOR CHEST  PAIN. IF NO RELIEF, CONTACT MD. 25 tablet 3   pantoprazole (PROTONIX) 40 MG tablet Take 40 mg by mouth daily.     potassium chloride (KLOR-CON M) 10 MEQ tablet Take 1 tablet (10 mEq total) by mouth every other day. 30 tablet 5   spironolactone (ALDACTONE) 25 MG tablet Take 0.5 tablets (12.5 mg total) by mouth daily. 15 tablet 6   torsemide (DEMADEX) 20 MG tablet Take 1 tablet (20 mg total) by mouth every other day. 30 tablet 5   amiodarone (PACERONE) 200 MG tablet Take 1 tablet (200 mg total) by mouth daily. 30 tablet 3   No current facility-administered medications for this encounter.    Vitals:   04/05/22 1407  BP: 120/90  Pulse: 73  SpO2: 97%  Weight: 63.7 kg (140 lb 6.4 oz)    PHYSICAL EXAM:  General:  Well appearing. Ambulated into clinic. HEENT: normal Neck: supple. no JVD.  Cor: PMI nondisplaced. Regular rate & rhythm. No rubs, gallops, 3/6 MR murmur. Lungs: clear Abdomen: soft, nontender, nondistended. No hepatosplenomegaly.  Extremities: no cyanosis, clubbing, rash, edema Neuro: alert & orientedx3, cranial nerves grossly intact. moves all 4 extremities w/o difficulty. Affect pleasant    ECG: SR 68 bpm   ASSESSMENT & PLAN:  1. Chronic systolic CHF: Ischemic cardiomyopathy, St Jude ICD. Echo in 10/19 with EF 20-25%.  Echo in 5/22 with EF 25%, normal RV.  Echo 1/23 showed EF 25%. R/LHC in 1/23 at [facility] showed no new CAD, preserved CO and low PCWP suggesting over-diuresis. PYP scan obtained and did not suggest TTR amyloidosis. Echo 04/23 showed EF 20-25% with mild LV dilation,  no LV thrombus, moderate-severe MR with ERO 0.39 cm^2 by PISA, normal RV, PASP 49. RHC today RA mean 2, PA 49/12 (31), PCWP mean 21 with prominent v waves to 45 suggesting severe MR, Fick CO/CI 3.81/2.18, PA 66%. See below regarding workup for MR. - Meds adjusted frequently with AKI and low BP.  - NYHA early III. Volume looks good. Continue Torsemide 20 mg every other day. - Continue spironolactone 12.5  mg daily.  - Continue Jardiance 10 mg daily.  - Beta blocker stopped during recent admit d/t borderline low output - No BP room for ARNi/ARB.  Can consider valsartan in the future if BP allows. - Continue digoxin 0.125 daily but must follow creatinine closely.  - CMET today 2. Mitral regurgitation: Echo 04/23 showed moderate-severe likely functional MR.  He may benefit from Mitraclip procedure.   - TEE 5/25 with moderate to severe MR - Elevated PCWP with prominent V waves on RHC suggestive of severe MR - Structural heart team consulted during recent admit and started workup for MitraClip. Needs dental evaluation, scheduled for 06/29. 3. CAD:  Extensive prior history of CAD.  He has known total occlusion LCx and PLV.  NSTEMI 10/19 with DES to mid LAD.  Coronary angiography in 9/21 showed stable coronary disease, no intervention was done.  Admission 1/23 concerning for ACS, however LHC showed patent stents, no new stents placed. Warfarin was stopped and continued on clopidogrel. Had CP during recent admit that was atypical and felt to be musculoskeletal in etiology - Continue clopidogrel.   - No statin given history of rhabdomyolysis on statin.  He is now on Repatha and Zetia. Lipids ok 1/23.  4. Diabetes: Taking SGLT2i. 5. Smoking: He has quit.  6. PAD: No claudication, foot ulcers. ABIs in 10/21 were normal.    7. LV thrombus:  Echo 04/23 showed no LV thrombus.  Warfarin was stopped during one of his admissions at [facility]. No LV thrombus on TEE this admit. Will leave off for now.  8. 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  - As above, off Coumadin and on clopidogrel. 9. ETOH use: Advised complete cessation. 10. NSVT: Coreg d/c w/ marginal output and soft BP.  - At discharge, started amio 200 mg bid x 10 days>>200 mg daily. Looks like amiodarone was stopped at the facility after 10 days (last dose 06/05). Restart amiodarone at 200 mg daily. - LFTs, TSH, Free T4 today   Follow-up: [facility]  clinic 2 months  Marlyce Huge, PA-C

## 2022-04-05 NOTE — Patient Instructions (Addendum)
Instructions below given to patients via SNF ([facility] ph [phone])  RESTART Amiodarone 200 mg, one tab daily   Labs today We will only contact you if something comes back abnormal or we need to make some changes. Otherwise no news is good news!   Your physician recommends that you schedule a follow-up appointment in: 2 months  in the [facility] ([facility]/NP) [facility]    Do the following things EVERYDAY: Weigh yourself in the morning before breakfast. Write it down and keep it in a log. Take your medicines as prescribed Eat low salt foods--Limit salt (sodium) to 2000 mg per day.  Stay as active as you can everyday Limit all fluids for the day to less than 2 liters

## 2022-04-06 ENCOUNTER — Telehealth (HOSPITAL_COMMUNITY): Payer: Self-pay | Admitting: Surgery

## 2022-04-06 NOTE — Telephone Encounter (Signed)
-----   Message from Joette Catching, [place] sent at 04/05/2022  4:17 PM EDT ----- Kidney function worse. Takes torsemide and potassium supplement every other day. Would reduce both to just as needed for weight gain or leg edema (should take potassium when taking torsemide).

## 2022-04-06 NOTE — Telephone Encounter (Signed)
Patient called back for lab results after hours. Reviewed results with patient and went over recommendations below.  Darreld Mclean, PA-C 04/06/2022 7:39 PM

## 2022-04-06 NOTE — Telephone Encounter (Signed)
I attempted to reach patient with results and recommendations from provider.  I left a message with the person that answered his phone indicating that we needed a call back.
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
## 2022-04-09 ENCOUNTER — Ambulatory Visit (INDEPENDENT_AMBULATORY_CARE_PROVIDER_SITE_OTHER): Payer: Medicare Other | Admitting: Physician Assistant

## 2022-04-09 ENCOUNTER — Encounter: Payer: Self-pay | Admitting: Physician Assistant

## 2022-04-09 VITALS — BP 98/62 | HR 64 | Ht 66.5 in | Wt 136.0 lb

## 2022-04-09 DIAGNOSIS — I2510 Atherosclerotic heart disease of native coronary artery without angina pectoris: Secondary | ICD-10-CM

## 2022-04-09 DIAGNOSIS — I255 Ischemic cardiomyopathy: Secondary | ICD-10-CM

## 2022-04-09 DIAGNOSIS — I5022 Chronic systolic (congestive) heart failure: Secondary | ICD-10-CM

## 2022-04-09 DIAGNOSIS — I5042 Chronic combined systolic (congestive) and diastolic (congestive) heart failure: Secondary | ICD-10-CM

## 2022-04-09 DIAGNOSIS — Z95810 Presence of automatic (implantable) cardiac defibrillator: Secondary | ICD-10-CM

## 2022-04-09 DIAGNOSIS — I4729 Other ventricular tachycardia: Secondary | ICD-10-CM

## 2022-04-09 LAB — CUP PACEART INCLINIC DEVICE CHECK
Battery Remaining Longevity: 64 mo
Brady Statistic RV Percent Paced: 0.01 %
Date Time Interrogation Session: 20230612160500
HighPow Impedance: 66.375
Implantable Lead Implant Date: 20181120
Implantable Lead Location: 753860
Implantable Pulse Generator Implant Date: 20181120
Lead Channel Impedance Value: 337.5 Ohm
Lead Channel Pacing Threshold Amplitude: 0.75 V
Lead Channel Pacing Threshold Amplitude: 0.75 V
Lead Channel Pacing Threshold Pulse Width: 0.5 ms
Lead Channel Pacing Threshold Pulse Width: 0.5 ms
Lead Channel Sensing Intrinsic Amplitude: 12 mV
Lead Channel Setting Pacing Amplitude: 2.5 V
Lead Channel Setting Pacing Pulse Width: 0.5 ms
Lead Channel Setting Sensing Sensitivity: 0.5 mV
Pulse Gen Serial Number: 9786940

## 2022-04-09 NOTE — Patient Instructions (Signed)
Medication Instructions:   Your physician recommends that you continue on your current medications as directed. Please refer to the Current Medication list given to you today.  *If you need a refill on your cardiac medications before your next appointment, please call your pharmacy*   Lab Work:  [place]   If you have labs (blood work) drawn today and your tests are completely normal, you will receive your results only by: [place] (if you have MyChart) OR A paper copy in the mail If you have any lab test that is abnormal or we need to change your treatment, we will call you to review the results.   Testing/Procedures: NONE ORDERED  TODAY   Follow-Up: At [facility], you and your health needs are our priority.  As part of our continuing mission to provide you with exceptional heart care, we have created designated Provider Care Teams.  These Care Teams include your primary Cardiologist (physician) and Advanced Practice Providers (APPs -  Physician Assistants and Nurse Practitioners) who all work together to provide you with the care you need, when you need it.  We recommend signing up for the patient portal called "MyChart".  Sign up information is provided on this After Visit Summary.  MyChart is used to connect with patients for Virtual Visits (Telemedicine).  Patients are able to view lab/test results, encounter notes, upcoming appointments, etc.  Non-urgent messages can be sent to your provider as well.   To learn more about what you can do with MyChart, go to [email or web address].    Your next appointment:   1 year(s)  The format for your next appointment:   In Person  Provider:   You may see Thompson Grayer, MD or one of the following Advanced Practice Providers on your designated Care Team:   Tommye Standard, [place]   If primary card or EP is not listed click here to update    :1}    Other Instructions   Important Information About Sugar

## 2022-04-16 ENCOUNTER — Ambulatory Visit (INDEPENDENT_AMBULATORY_CARE_PROVIDER_SITE_OTHER): Payer: Medicare Other

## 2022-04-16 DIAGNOSIS — Z95810 Presence of automatic (implantable) cardiac defibrillator: Secondary | ICD-10-CM

## 2022-04-16 DIAGNOSIS — I5042 Chronic combined systolic (congestive) and diastolic (congestive) heart failure: Secondary | ICD-10-CM

## 2022-04-18 NOTE — Progress Notes (Signed)
EPIC Encounter for ICM Monitoring  Patient Name: Daniel Li is a 66 y.o. male Date: 04/18/2022 Primary Care Physican: [name], NP Primary Cardiologist: [facility] Electrophysiologist: Allred 04/05/2022 Office Weight: 140 lbs                                                  Transmission reviewed.      CorVue thoracic impedance suggesting normal fluid levels.      Prescribed dosage:  Torsemide 20 mg Take 20 mg by mouth as needed (Per 6/8 Lab note but not updated in Epic) Potassium 10 mEq take 1 tablet by mouth when taking Torsemide Spironolactone 25 mg take 0.5 tablet (12.5 mg total) daily   Labs: 04/05/2022 Creatinine 1.46, BUN 12, Potassium 4.4, Sodium 142, GFR 53 03/23/2022 Creatinine 1.03, BUN 11, Potassium 4.1, Sodium 140, GFR >60 03/22/2022 Creatinine 1.11, BUN 9,   Potassium 4.4, Sodium 141, GFR >60 02/19/2022 Creatinine 1.00, BUN 11, Potassium 3.9, Sodium 141, GFR >60 12/29/2021 Creatinine 1.10, BUN 14, Potassium 3.4, Sodium 145 Care Everywhere 12/28/2021 Creatinine 0.96, BUN 10, Potassium 4.0, Sodium 145 Care Everywhere 12/26/2021 Creatinine 1.07, BUN 9,   Potassium 4.0, Sodium 145 Care Everywhere A complete set of results can be found in Results Review.   Recommendations:  No changes   Follow-up plan: ICM clinic phone appointment on 05/28/2022.    91 day device clinic remote transmission 05/14/2022.     EP/Cardiology Office Visits:   03/21/2022 with Dr Aundra Dubin.  03/19/2022 with Dr Rayann Heman.   Copy of ICM check sent to Dr. Rayann Heman.   3 month ICM trend: 04/16/2022.    12-14 Month ICM trend:     Rosalene Billings, RN 04/18/2022 1:12 PM

## 2022-04-26 ENCOUNTER — Telehealth: Payer: Self-pay | Admitting: Cardiology

## 2022-04-26 NOTE — Telephone Encounter (Signed)
Patient contacted in reference to dental consult however this was cancelled due to no transportation and has not yet been re-scheduled. Instructed to give [place] a call when this has been scheduled so that we can follow along.   [name] NP-C Structural Heart Team  Pager: [phone] Phone: [phone]

## 2022-05-03 ENCOUNTER — Other Ambulatory Visit (HOSPITAL_COMMUNITY): Payer: Self-pay | Admitting: Cardiology

## 2022-05-07 NOTE — Telephone Encounter (Signed)
Called to follow-up to see if dental appointment has been rescheduled. His caregiver reports the other caregiver, Webb Silversmith, organizes appointments and she will be in tomorrow. Will call in the morning to confirm.

## 2022-05-08 NOTE — Telephone Encounter (Signed)
Spoke with Webb Silversmith this morning. She states she has had difficulties rescheduling the patient's dental visit and the office never calls back to arrange appointments.   Called Dr. Lupita Leash office for Webb Silversmith. They immediately answered and stated the patient has no-showed and cancelled the last 3 appointments and it is their policy not to reschedule again. Explained to them that I do not think the issue is with the patient not wanting to come, the issue is with the facility not being able to get him there. Reiterated to them that the patient needs a dental assessment to have a heart procedure done. Informed Dr. Lupita Leash office I would personally speak with the coordinator at the facility to arrange transportation. Scheduled the patient with Dr. Hoyt Koch  August 30 at 1130. They will call if a sooner appointment becomes available.  Called Webb Silversmith to confirm appointment. She states they will no longer use Dr. Hoyt Koch as they are too difficult to schedule appointments. She states she will call another dentist and get an appointment. She requested I call Dr. Hoyt Koch and inform him the patient will no longer go there for treatment. Instructed Webb Silversmith to call me directly when she gets the patient scheduled for dental appointment as it directly affects his valve surgery timing.  Called Dr. Lupita Leash office, cancelled appointment, and apologized for inconvenience.

## 2022-05-09 ENCOUNTER — Telehealth: Payer: Self-pay | Admitting: Cardiology

## 2022-05-09 ENCOUNTER — Other Ambulatory Visit (HOSPITAL_COMMUNITY): Payer: Self-pay | Admitting: Cardiology

## 2022-05-09 NOTE — Telephone Encounter (Signed)
Pt would like for nurse Lattie Haw) to return call. Please advise

## 2022-05-09 NOTE — Telephone Encounter (Signed)
Called and spoke with pt.  He didn't have any questions.  Just wanted to call and talk.

## 2022-05-14 ENCOUNTER — Ambulatory Visit (INDEPENDENT_AMBULATORY_CARE_PROVIDER_SITE_OTHER): Payer: Medicare Other

## 2022-05-14 DIAGNOSIS — I5042 Chronic combined systolic (congestive) and diastolic (congestive) heart failure: Secondary | ICD-10-CM | POA: Diagnosis not present

## 2022-05-14 LAB — CUP PACEART REMOTE DEVICE CHECK
Battery Remaining Longevity: 58 mo
Battery Remaining Percentage: 55 %
Battery Voltage: 2.87 V
Brady Statistic RV Percent Paced: 1 %
Date Time Interrogation Session: 20230717051850
HighPow Impedance: 61 Ohm
HighPow Impedance: 61 Ohm
Implantable Lead Implant Date: 20181120
Implantable Lead Location: 753860
Implantable Pulse Generator Implant Date: 20181120
Lead Channel Impedance Value: 310 Ohm
Lead Channel Pacing Threshold Amplitude: 0.75 V
Lead Channel Pacing Threshold Pulse Width: 0.5 ms
Lead Channel Sensing Intrinsic Amplitude: 11.6 mV
Lead Channel Setting Pacing Amplitude: 2.5 V
Lead Channel Setting Pacing Pulse Width: 0.5 ms
Lead Channel Setting Sensing Sensitivity: 0.5 mV
Pulse Gen Serial Number: 9786940

## 2022-05-17 NOTE — Telephone Encounter (Signed)
Spoke with Lelon Frohlich, who states she does not have a dental visit scheduled for Mr. Racanelli.  She will call next week once she gets an appointment. Reiterated the patient needs to have dental eval prior to proceeding with heart valve repair. She was grateful for reminder.

## 2022-05-25 NOTE — Telephone Encounter (Signed)
Spoke with Daniel Li, and the pt is scheduled for dental appointment on 8/16 with Dr Alyse Low in [place], [place].

## 2022-05-28 ENCOUNTER — Ambulatory Visit (INDEPENDENT_AMBULATORY_CARE_PROVIDER_SITE_OTHER): Payer: Medicare Other

## 2022-05-28 DIAGNOSIS — I5042 Chronic combined systolic (congestive) and diastolic (congestive) heart failure: Secondary | ICD-10-CM

## 2022-05-28 DIAGNOSIS — Z95810 Presence of automatic (implantable) cardiac defibrillator: Secondary | ICD-10-CM

## 2022-05-31 NOTE — Progress Notes (Signed)
EPIC Encounter for ICM Monitoring  Patient Name: Daniel Li is a 66 y.o. male Date: 05/31/2022 Primary Care Physican: [name], NP Primary Cardiologist: [facility] Electrophysiologist: Allred 04/05/2022 Office Weight: 140 lbs                                                  Transmission reviewed.      CorVue thoracic impedance suggesting normal fluid levels.      Prescribed dosage:  Torsemide 20 mg Take 20 mg by mouth every other day Potassium 10 mEq take 1 tablet by mouth every other day Spironolactone 25 mg take 0.5 tablet (12.5 mg total) daily   Labs: 04/05/2022 Creatinine 1.46, BUN 12, Potassium 4.4, Sodium 142, GFR 53 03/23/2022 Creatinine 1.03, BUN 11, Potassium 4.1, Sodium 140, GFR >60 03/22/2022 Creatinine 1.11, BUN 9,   Potassium 4.4, Sodium 141, GFR >60 02/19/2022 Creatinine 1.00, BUN 11, Potassium 3.9, Sodium 141, GFR >60 12/29/2021 Creatinine 1.10, BUN 14, Potassium 3.4, Sodium 145 Care Everywhere 12/28/2021 Creatinine 0.96, BUN 10, Potassium 4.0, Sodium 145 Care Everywhere 12/26/2021 Creatinine 1.07, BUN 9,   Potassium 4.0, Sodium 145 Care Everywhere A complete set of results can be found in Results Review.   Recommendations:  No changes   Follow-up plan: ICM clinic phone appointment on 07/03/2022.    91 day device clinic remote transmission 08/13/2022.     EP/Cardiology Office Visits:   06/05/2022 with Dr Aundra Dubin.  Recall 04/04/2023 with Ricke Hey, PA.  06/29/2022 with Dr Harl Bowie.   Copy of ICM check sent to Dr. Rayann Heman.   3 month ICM trend: 05/28/2022.    12-14 Month ICM trend:     Rosalene Billings, RN 05/31/2022 3:51 PM

## 2022-06-04 ENCOUNTER — Telehealth (HOSPITAL_COMMUNITY): Payer: Self-pay

## 2022-06-04 NOTE — Telephone Encounter (Signed)
Called and spoke to ms. Daniel Li to confirm/remind patient of their appointment at the [facility] on 06/05/22.   Patient reminded to bring all medications and/or complete list.  Confirmed patient has transportation. Gave directions, instructed to utilize [place] parking.  Confirmed appointment prior to ending call.

## 2022-06-05 ENCOUNTER — Encounter (HOSPITAL_COMMUNITY): Payer: Medicare Other

## 2022-06-07 ENCOUNTER — Encounter (HOSPITAL_COMMUNITY): Payer: Medicare Other

## 2022-06-15 ENCOUNTER — Telehealth: Payer: Self-pay

## 2022-06-15 NOTE — Telephone Encounter (Signed)
Spoke with Lelon Frohlich at the patient's facility.  The patient did not go to his dental appointment earlier this week. Ann states they cannot find a dentist that will accept Medicaid that is also close enough for transportation to be covered.  Reiterated to her that the patient will need to be seen by a dentist prior to have heart valve procedure. She agreed and stated she knows he has several teeth that need to be pulled.  Informed her I will attempt to find a dentist in [place] who accepts Medicaid and call her next week to schedule an appointment. She was grateful for call and agrees with plan.

## 2022-06-18 NOTE — Progress Notes (Signed)
Remote ICD transmission.   

## 2022-06-21 NOTE — Telephone Encounter (Signed)
Called and spoke with Daniel Li. Gave her number for [facility], who accepts Medicaid. She will call and arrange an appointment to have teeth evaluated. Will call early next week to confirm.

## 2022-06-26 ENCOUNTER — Telehealth (HOSPITAL_COMMUNITY): Payer: Self-pay

## 2022-06-26 NOTE — Telephone Encounter (Signed)
Confirmed with Lelon Frohlich that the patient has an appointment with [facility] 9/25.

## 2022-06-26 NOTE — Telephone Encounter (Signed)
Called and spoke to patient's caregiver Lelon Frohlich to confirm/remind patient of their appointment at the [facility] on 06/27/22. However, she requested patient's appointment be changed to another day due to his transportation.  Patient's appointment has been rescheduled.  Patient reminded to bring all medications and/or complete list.  Confirmed patient has transportation. Gave directions, instructed to utilize [place] parking.  Confirmed appointment prior to ending call.

## 2022-06-27 ENCOUNTER — Encounter (HOSPITAL_COMMUNITY): Payer: Medicare Other

## 2022-07-03 ENCOUNTER — Ambulatory Visit (INDEPENDENT_AMBULATORY_CARE_PROVIDER_SITE_OTHER): Payer: Medicare Other

## 2022-07-03 DIAGNOSIS — Z95810 Presence of automatic (implantable) cardiac defibrillator: Secondary | ICD-10-CM | POA: Diagnosis not present

## 2022-07-03 DIAGNOSIS — I5042 Chronic combined systolic (congestive) and diastolic (congestive) heart failure: Secondary | ICD-10-CM

## 2022-07-04 NOTE — Progress Notes (Signed)
EPIC Encounter for ICM Monitoring  Patient Name: Daniel Li is a 66 y.o. male Date: 07/04/2022 Primary Care Physican: [name], NP Primary Cardiologist: [facility] Electrophysiologist: Quentin Ore 04/05/2022 Office Weight: 140 lbs                                                  Transmission reviewed.      CorVue thoracic impedance suggesting normal fluid levels.      Prescribed dosage:  Torsemide 20 mg Take 20 mg by mouth every other day Potassium 10 mEq take 1 tablet by mouth every other day Spironolactone 25 mg take 0.5 tablet (12.5 mg total) daily   Labs: 04/05/2022 Creatinine 1.46, BUN 12, Potassium 4.4, Sodium 142, GFR 53 03/23/2022 Creatinine 1.03, BUN 11, Potassium 4.1, Sodium 140, GFR >60 03/22/2022 Creatinine 1.11, BUN 9,   Potassium 4.4, Sodium 141, GFR >60 02/19/2022 Creatinine 1.00, BUN 11, Potassium 3.9, Sodium 141, GFR >60 12/29/2021 Creatinine 1.10, BUN 14, Potassium 3.4, Sodium 145 Care Everywhere 12/28/2021 Creatinine 0.96, BUN 10, Potassium 4.0, Sodium 145 Care Everywhere 12/26/2021 Creatinine 1.07, BUN 9,   Potassium 4.0, Sodium 145 Care Everywhere A complete set of results can be found in Results Review.   Recommendations:  No changes   Follow-up plan: ICM clinic phone appointment on 08/14/2022.    91 day device clinic remote transmission 08/13/2022.     EP/Cardiology Office Visits:  07/18/2022 with HF clinic.  Recall 04/04/2023 with Ricke Hey, PA.  Recall 06/29/2022 with Dr Harl Bowie.   Copy of ICM check sent to Dr. Quentin Ore.   3 month ICM trend: 07/03/2022.    12-14 Month ICM trend:     Rosalene Billings, RN 07/04/2022 4:53 PM

## 2022-07-11 ENCOUNTER — Other Ambulatory Visit (HOSPITAL_COMMUNITY): Payer: Self-pay | Admitting: Cardiology

## 2022-07-12 ENCOUNTER — Other Ambulatory Visit (HOSPITAL_COMMUNITY): Payer: Self-pay | Admitting: Cardiology

## 2022-07-17 ENCOUNTER — Telehealth (HOSPITAL_COMMUNITY): Payer: Self-pay

## 2022-07-17 NOTE — Telephone Encounter (Signed)
Called to confirm/remind patient of their appointment at the [facility] on 07/18/22.   Patient reminded to bring all medications and/or complete list.  Confirmed patient has transportation. Gave directions, instructed to utilize [place] parking.  Confirmed appointment prior to ending call.

## 2022-07-18 ENCOUNTER — Encounter (HOSPITAL_COMMUNITY): Payer: Medicare Other

## 2022-08-01 ENCOUNTER — Telehealth: Payer: Self-pay

## 2022-08-01 NOTE — Telephone Encounter (Signed)
Spoke with representative from Mr. Bourquin's facility. She states it does not look like he went to his dental appointment last week (he has no-showed or cancelled several dental appointments).  He has also no-showed or cancelled several CHF Clinic appointments.   Requested that Lelon Frohlich, facility coordinator, call me directly tomorrow for a patient update as she is not there today.

## 2022-08-02 NOTE — Telephone Encounter (Signed)
[name] spoke with Lelon Frohlich from the patient's facility. She states their transportation as improved recently, but only after they had to cancel his dental appointment. The patient is now scheduled for a dental evaluation on 08/20/22 at [facility].  She confirmed she will have the patient at the appointment.

## 2022-08-09 ENCOUNTER — Telehealth (HOSPITAL_COMMUNITY): Payer: Self-pay

## 2022-08-09 NOTE — Telephone Encounter (Signed)
Called and spoke to patient's caregiver to confirm/remind patient of their appointment at the [facility] on 08/10/22.

## 2022-08-10 ENCOUNTER — Ambulatory Visit (HOSPITAL_COMMUNITY)
Admission: RE | Admit: 2022-08-10 | Discharge: 2022-08-10 | Disposition: A | Discharge status: Home / Self Care | Payer: Medicare Other | Source: Ambulatory Visit | Attending: Family Medicine | Admitting: Family Medicine

## 2022-08-10 ENCOUNTER — Encounter (HOSPITAL_COMMUNITY): Payer: Self-pay

## 2022-08-10 VITALS — BP 98/66 | HR 67 | Wt 140.2 lb

## 2022-08-10 DIAGNOSIS — Z8673 Personal history of transient ischemic attack (TIA), and cerebral infarction without residual deficits: Secondary | ICD-10-CM | POA: Diagnosis not present

## 2022-08-10 DIAGNOSIS — I252 Old myocardial infarction: Secondary | ICD-10-CM | POA: Insufficient documentation

## 2022-08-10 DIAGNOSIS — I110 Hypertensive heart disease with heart failure: Secondary | ICD-10-CM | POA: Diagnosis present

## 2022-08-10 DIAGNOSIS — I5022 Chronic systolic (congestive) heart failure: Secondary | ICD-10-CM

## 2022-08-10 DIAGNOSIS — G4733 Obstructive sleep apnea (adult) (pediatric): Secondary | ICD-10-CM | POA: Insufficient documentation

## 2022-08-10 DIAGNOSIS — Z720 Tobacco use: Secondary | ICD-10-CM

## 2022-08-10 DIAGNOSIS — I4729 Other ventricular tachycardia: Secondary | ICD-10-CM

## 2022-08-10 DIAGNOSIS — Z7902 Long term (current) use of antithrombotics/antiplatelets: Secondary | ICD-10-CM | POA: Insufficient documentation

## 2022-08-10 DIAGNOSIS — I236 Thrombosis of atrium, auricular appendage, and ventricle as current complications following acute myocardial infarction: Secondary | ICD-10-CM

## 2022-08-10 DIAGNOSIS — E119 Type 2 diabetes mellitus without complications: Secondary | ICD-10-CM | POA: Insufficient documentation

## 2022-08-10 DIAGNOSIS — F1090 Alcohol use, unspecified, uncomplicated: Secondary | ICD-10-CM | POA: Insufficient documentation

## 2022-08-10 DIAGNOSIS — Z7984 Long term (current) use of oral hypoglycemic drugs: Secondary | ICD-10-CM | POA: Insufficient documentation

## 2022-08-10 DIAGNOSIS — I340 Nonrheumatic mitral (valve) insufficiency: Secondary | ICD-10-CM

## 2022-08-10 DIAGNOSIS — Z79899 Other long term (current) drug therapy: Secondary | ICD-10-CM | POA: Diagnosis not present

## 2022-08-10 DIAGNOSIS — I2510 Atherosclerotic heart disease of native coronary artery without angina pectoris: Secondary | ICD-10-CM | POA: Diagnosis not present

## 2022-08-10 DIAGNOSIS — E1159 Type 2 diabetes mellitus with other circulatory complications: Secondary | ICD-10-CM

## 2022-08-10 DIAGNOSIS — Z955 Presence of coronary angioplasty implant and graft: Secondary | ICD-10-CM | POA: Insufficient documentation

## 2022-08-10 DIAGNOSIS — I739 Peripheral vascular disease, unspecified: Secondary | ICD-10-CM

## 2022-08-10 DIAGNOSIS — F1011 Alcohol abuse, in remission: Secondary | ICD-10-CM

## 2022-08-10 DIAGNOSIS — I472 Ventricular tachycardia, unspecified: Secondary | ICD-10-CM | POA: Diagnosis not present

## 2022-08-10 DIAGNOSIS — I255 Ischemic cardiomyopathy: Secondary | ICD-10-CM | POA: Diagnosis present

## 2022-08-10 LAB — BASIC METABOLIC PANEL
Anion gap: 8 (ref 5–15)
BUN: 15 mg/dL (ref 8–23)
CO2: 27 mmol/L (ref 22–32)
Calcium: 9.7 mg/dL (ref 8.9–10.3)
Chloride: 105 mmol/L (ref 98–111)
Creatinine, Ser: 1.5 mg/dL — ABNORMAL HIGH (ref 0.61–1.24)
GFR, Estimated: 51 mL/min — ABNORMAL LOW (ref >60)
Glucose, Bld: 143 mg/dL — ABNORMAL HIGH (ref 70–99)
Potassium: 4.3 mmol/L (ref 3.5–5.1)
Sodium: 140 mmol/L (ref 135–145)

## 2022-08-10 LAB — DIGOXIN LEVEL: Digoxin Level: 0.9 ng/mL (ref 0.8–2.0)

## 2022-08-10 NOTE — Patient Instructions (Signed)
It was great to see you today! No medication changes are needed at this time.  Labs today We will only contact you if something comes back abnormal or we need to make some changes. Otherwise no news is good news!  Your physician wants you to follow-up in: 4 moths with Dr Kendall Flack will receive a reminder letter in the mail two months in advance. If you don't receive a letter, please call our office to schedule the follow-up appointment in January 2024.

## 2022-08-10 NOTE — Progress Notes (Signed)
PCP: [name], NP Primary Cardiologist: Dr. Harl Bowie HF: Dr. Aundra Dubin  HPI:  Daniel Li is a 66 y.o. male with long history of CAD s/p multiple ACS episodes, HTN, HLD, tobacco abuse, DM, OSA, CVA and systolic CHF.    He has a history of CAD with NSTEMI in 2007 with BMS placement to his D1 and OM2. Also with history of STEMI in May 2013 at which time he had a BMS placed to his RCA. NSTEMI in January of 2014, cath showed CTO of left circumflex, unable to intervene upon. EF 55-60% in September 2016. Anterior STEMI 4/18, DES to pLAD.    Admitted 8/9 - 06/11/17 with chest pain. No targets for PCI, LAD stent patent. EF remained low at 20-25% as below. Now with St Jude ICD.    Admitted 4/16 - 02/17/2018 with NSTEMI. Underwent LHC again with unchanged anatomy, no intervention. Discharged back to assisted living.    He was admitted again in 10/19 with NSTEMI.  LHC showed totally occluded PLV, 75% PDA stenosis, 80% mid LAD, occluded LCx.  He had DES to mLAD. Repeat echo showed EF 20-25%.  RHC/LHC in 9/21 showed unchanged coronaries with occluded PLV and proximal LCx with collaterals, patent LAD, 60% D2, 60% PDA. Normal filling pressures and relatively preserved cardiac output.    Echo in 5/22 showed EF 25% with mild LV dilation, no LV thrombus, normal RV, mild MR.    Admitted 11/05/21 at [facility] for hypotension and AKI. Echo ordered but not completed. Received IVF and beta blocker and losartan stopped at discharge.    Admitted 11/20/21 to [facility] with CP, concern for ACS. R/LHC with 3-vessel disease with patent stents (no intervention) CO 3.0 and low PWCP at 7 mmHg. Echo showed EF~25%, no evidence of thrombus, warfarin stopped and he was transitioned to Plavix. Dig, losartan and spiro stopped due to AKI. PYP scan obtained and showed no evidence of TTR amyloidosis.    Admitted to [facility] 2/23 with CP and fall. Ethanol on admission 184. Troponin elevated but felt to be in setting of ETOH use and fall.    Echo  4/23: EF 20-25% with mild LV dilation, no LV thrombus, moderate-severe MR with ERO 0.39 cm^2 by PISA, normal RV, PASP 49.    Presented for TEE and RHC on 03/22/22 as part of workup for consideration of MitraClip. TEE with evidence of moderate to severe MR. RHC with elevated PCWP and prominent v-waves suggestive of severe MR, normal RA pressure, pulmonary venous HTN, low CO. He was direct admitted for overnight observation, given he has no one at home to stay with him post procedure. While admitted, Structural heart team started assessment for MitraClip. They will continue to follow in their clinic and have recommended teeth extraction prior to potential implant. Given marginal CO on cath and soft BP, Coreg was discontinued. Digoxin 0.125 mg added. He was noted to have 15 beat run of NSVT on tele. He was started on amiodarone.   Today he returns for HF follow up. Overall feeling fine. He resides at a residential facility. He does not have dyspnea with walking on flat ground. Able to complete ADLs without issue. Denies palpitations, abnormal bleeding, CP, dizziness, edema, or PND/Orthopnea. Appetite ok. No fever or chills. He does not weigh at home. Taking all medications provided by facility. Dental evaluation scheduled later this month.  Device interrogation (personally reviewed): CorVue suggests volume stable, no VT.  Labs (6/23): K 4.4, creatinine 1.46, LFTs normal, TSH normal, Lp (a)  232  Cardiac Studies - TEE (5/23): EF 20-25%, RV mildly reduced, no LAA, severe MR with no mitral stenosis, MV mean gradient 2.0 mmHg  - RHC (5/23): RA mean 2, PA 49/12 (mean 31), PCWP mean 21 with prominent v-waves to 45, CO/CI (Fick) 3.81/2.18  - Echo (4/23): EF 20-25% with mild LV dilation, no LV thrombus, moderate-severe MR with ERO 0.39 cm^2 by PISA, normal RV, PASP 49.   - Echo (1/23): EF~25%, no evidence of thrombus  - Echo (5/22): EF 25% with mild LV dilation, no LV thrombus, normal RV, mild MR.   -  RHC/LHC (9/21): showed unchanged coronaries with occluded PLV and proximal LCx with collaterals, patent LAD, 60% D2, 60% PDA. Normal filling pressures and relatively preserved cardiac output.    ROS: All systems negative except as listed in HPI, PMH and Problem List.  SH:  Social History   Socioeconomic History   Marital status: Single    Spouse name: Not on file   Number of children: 0   Years of education: 9   Highest education level: 9th grade  Occupational History   Not on file  Tobacco Use   Smoking status: Some Days    Packs/day: 0.50    Years: 20.00    Total pack years: 10.00    Types: Cigarettes, Cigars    Start date: 07/21/1977   Smokeless tobacco: Never  Vaping Use   Vaping Use: Never used  Substance and Sexual Activity   Alcohol use: Yes    Comment: 09/17/2017 "nothing since 05/2017"   Drug use: Not Currently    Comment: previously incarcerated for drug related offense.   Sexual activity: Not Currently  Other Topics Concern   Not on file  Social History Narrative   Not on file   Social Determinants of Health   Financial Resource Strain: Medium Risk (10/01/2018)   Overall Financial Resource Strain (CARDIA)    Difficulty of Paying Living Expenses: Somewhat hard  Food Insecurity: Food Insecurity Present (10/01/2018)   Hunger Vital Sign    Worried About Running Out of Food in the Last Year: Never true    Ran Out of Food in the Last Year: Sometimes true  Transportation Needs: Unmet Transportation Needs (10/01/2018)   PRAPARE - Hydrologist (Medical): Yes    Lack of Transportation (Non-Medical): No  Physical Activity: Not on file  Stress: Not on file  Social Connections: Not on file  Intimate Partner Violence: Not on file    FH:  Family History  Problem Relation Age of Onset   Stroke Mother    Heart attack Mother    Heart attack Father    Stroke Sister    Heart attack Sister    Heart attack Brother    Stroke Brother     Liver disease Neg Hx    Colon cancer Neg Hx     Past Medical History:  Diagnosis Date   AICD (automatic cardioverter/defibrillator) present    Anxiety    Arthritis    "all over" (09/17/2017)   Burn    CAD (coronary artery disease)    a. NSTEMI s/p BMS to 1st Diagonal and distal OM2 in 2007; b. STEMI 03/26/12 s/p BMS to RCA; c. NSTEMI 10/2012 : CTO of LCx (unable to open with PCI) and PL branch, mod dz of LAD and diagonal, and preserved LV systolic fxn, Med Rx;  d.  anterior STEMI in 01/2017 with DES to Proximal LAD   CAD in  native artery 08/23/2020   Cardiomyopathy EF 35% on cath 06/30/14, new from jan 2015 07/20/2014   Chest pain    CHF (congestive heart failure) (HCC)    Chronic back pain    "all over" (09/17/2017)   DDD (degenerative disc disease), cervical    Depression    GERD (gastroesophageal reflux disease)    Headache    "a few/wk" (09/17/2017)   HTN (hypertension)    Hypercholesterolemia    Mental disorder    Myocardial infarction ([place])    "I've had 7" (09/17/2017)   Pulmonary edema    Respiratory failure ([place])    Sciatic pain    Sleep apnea    Stroke [facility])    a. multiple dating back to 2002; *I''ve had 5; LUE/LLE weaker since" (09/17/2017)   Tibia fracture (l) leg   Tobacco abuse    Type 2 diabetes mellitus (HCC)    Unstable angina (HCC)     Current Outpatient Medications  Medication Sig Dispense Refill   acetaminophen (TYLENOL) 325 MG tablet Take 650 mg by mouth every 6 (six) hours as needed for mild pain, fever or headache.      amiodarone (PACERONE) 200 MG tablet TAKE (1) TABLET BY MOUTH ONCE DAILY. 90 tablet 3   clopidogrel (PLAVIX) 75 MG tablet Take 75 mg by mouth daily.     diclofenac Sodium (VOLTAREN) 1 % GEL Apply 2 g topically 4 (four) times daily as needed (pain).     digoxin (LANOXIN) 0.125 MG tablet TAKE (1) TABLET BY MOUTH ONCE DAILY. 30 tablet 3   Emollient (CERAVE SA ROUGH & BUMPY SKIN) LOTN Apply 1 application. topically 2 (two) times daily. Apply  to both feet     empagliflozin (JARDIANCE) 10 MG TABS tablet Take 10 mg by mouth daily.     Evolocumab (REPATHA SURECLICK) 073 MG/ML SOAJ INJECT CONTENTS OF ONE PREFILLED PEN SUBCUTANEOUSLY INTO THE SKIN EVERY 14 DAYS. (Patient taking differently: Inject 140 mg into the skin every 14 (fourteen) days.) 2 mL 11   ezetimibe (ZETIA) 10 MG tablet Take 1 tablet (10 mg total) by mouth daily. 30 tablet 3   fenofibrate (TRICOR) 145 MG tablet TAKE ONE TABLET BY MOUTH EVERY DAY. 30 tablet 11   gabapentin (NEURONTIN) 100 MG capsule Take 100 mg by mouth 2 (two) times daily.     metFORMIN (GLUCOPHAGE) 500 MG tablet Take 500 mg by mouth daily with breakfast.     nitroGLYCERIN (NITROSTAT) 0.4 MG SL tablet PLACE ONE (1) TABLET UNDER TONGUE EVERY 5 MINUTES UP TO (3) DOSES AS NEEDED FOR CHEST PAIN. IF NO RELIEF, CONTACT MD. 25 tablet 3   pantoprazole (PROTONIX) 40 MG tablet Take 40 mg by mouth daily.     potassium chloride (KLOR-CON) 10 MEQ tablet TAKE (1) TABLET BY MOUTH ONCE EVERY OTHER DAY. 30 tablet 11   spironolactone (ALDACTONE) 25 MG tablet TAKE (1/2) TABLET (12.'5MG'$ ) BY MOUTH DAILY. 45 tablet 3   torsemide (DEMADEX) 20 MG tablet Take 1 tablet (20 mg total) by mouth every other day. 30 tablet 5   No current facility-administered medications for this encounter.   BP 98/66   Pulse 67   Wt 63.6 kg (140 lb 3.2 oz)   SpO2 95%   BMI 22.29 kg/m   Wt Readings from Last 3 Encounters:  08/10/22 63.6 kg (140 lb 3.2 oz)  04/09/22 61.7 kg (136 lb)  04/05/22 63.7 kg (140 lb 6.4 oz)   PHYSICAL EXAM: General:  NAD. No resp difficulty HEENT:  Normal Neck: Supple. No JVD. Carotids 2+ bilat; no bruits. No lymphadenopathy or thryomegaly appreciated. Cor: PMI nondisplaced. Regular rate & rhythm. No rubs, gallops, 3/6 MR Lungs: Clear Abdomen: Soft, nontender, nondistended. No hepatosplenomegaly. No bruits or masses. Good bowel sounds. Extremities: No cyanosis, clubbing, rash, edema Neuro: Alert & oriented x 3,  cranial nerves grossly intact. Moves all 4 extremities w/o difficulty. Affect pleasant.  ASSESSMENT & PLAN:  1. Chronic systolic CHF: Ischemic cardiomyopathy, St Jude ICD. Echo in 10/19 with EF 20-25%.  Echo in 5/22 with EF 25%, normal RV.  Echo 1/23 showed EF 25%. R/LHC in 1/23 at [facility] showed no new CAD, preserved CO and low PCWP suggesting over-diuresis. PYP scan obtained and did not suggest TTR amyloidosis. Echo 04/23 showed EF 20-25% with mild LV dilation, no LV thrombus, moderate-severe MR with ERO 0.39 cm^2 by PISA, normal RV, PASP 49. RHC today RA mean 2, PA 49/12 (31), PCWP mean 21 with prominent v waves to 45 suggesting severe MR, Fick CO/CI 3.81/2.18, PA 66%. See below regarding workup for MR. - NYHA I. Volume looks good.  - Continue digoxin. Check level today. - Continue torsemide 20 mg every other day. BMET today. - Continue spironolactone 12.5 mg daily.  - Continue Jardiance 10 mg daily.  - Beta blocker stopped during recent admit d/t borderline low output - No BP room for ARNi/ARB.  Can consider valsartan in the future if BP allows. 2. Mitral regurgitation: Echo 04/23 showed moderate-severe likely functional MR.  He may benefit from Mitraclip procedure. TEE 5/25 with moderate to severe MR. Elevated PCWP with prominent V waves on RHC suggestive of severe MR. - Structural heart team consulted during recent admit and started workup for MitraClip. Needs dental evaluation, re-scheduled for later this month. 3. CAD:  Extensive prior history of CAD.  He has known total occlusion LCx and PLV.  NSTEMI 10/19 with DES to mid LAD.  Coronary angiography in 9/21 showed stable coronary disease, no intervention was done.  Admission 1/23 concerning for ACS, however LHC showed patent stents, no new stents placed. Warfarin was stopped and continued on clopidogrel. Had CP during recent admit that was atypical and felt to be musculoskeletal in etiology. - Continue clopidogrel.   - No statin given history of  rhabdomyolysis on statin.  He is now on Repatha and Zetia. Lipids ok 1/23.  4. Diabetes: Taking SGLT2i. No GU symptoms. 5. Smoking: He has quit.  6. PAD: No claudication, foot ulcers. ABIs in 10/21 were normal.    7. LV thrombus:  Echo 4/23 showed no LV thrombus.  Warfarin was stopped during one of his admissions at [facility]. No LV thrombus on TEE (5/23). Will leave off for now.  8. 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  - As above, off Coumadin and on clopidogrel. 9. ETOH use: No further ETOH use. Congratulated on cessation. 10. NSVT: Now off Coreg with marginal output and soft BP.  - Continue amiodarone at 200 mg daily. Normal LFTs and TSH (6/23)  Follow up in 4 months with Dr. Aundra Dubin.  Allena Katz, FNP-BC 08/10/22

## 2022-08-13 ENCOUNTER — Ambulatory Visit (INDEPENDENT_AMBULATORY_CARE_PROVIDER_SITE_OTHER): Payer: Medicare Other

## 2022-08-13 DIAGNOSIS — I5022 Chronic systolic (congestive) heart failure: Secondary | ICD-10-CM | POA: Diagnosis not present

## 2022-08-13 LAB — CUP PACEART REMOTE DEVICE CHECK
Battery Remaining Longevity: 61 mo
Battery Remaining Percentage: 58 %
Battery Voltage: 2.96 V
Brady Statistic RV Percent Paced: 1 %
Date Time Interrogation Session: 20231016034911
HighPow Impedance: 59 Ohm
HighPow Impedance: 59 Ohm
Implantable Lead Implant Date: 20181120
Implantable Lead Location: 753860
Implantable Pulse Generator Implant Date: 20181120
Lead Channel Impedance Value: 330 Ohm
Lead Channel Pacing Threshold Amplitude: 0.75 V
Lead Channel Pacing Threshold Pulse Width: 0.5 ms
Lead Channel Sensing Intrinsic Amplitude: 12 mV
Lead Channel Setting Pacing Amplitude: 2.5 V
Lead Channel Setting Pacing Pulse Width: 0.5 ms
Lead Channel Setting Sensing Sensitivity: 0.5 mV
Pulse Gen Serial Number: 9786940

## 2022-08-14 ENCOUNTER — Ambulatory Visit: Payer: Medicare Other | Admitting: Cardiology

## 2022-08-14 ENCOUNTER — Ambulatory Visit (INDEPENDENT_AMBULATORY_CARE_PROVIDER_SITE_OTHER): Payer: Medicare Other

## 2022-08-14 DIAGNOSIS — I5022 Chronic systolic (congestive) heart failure: Secondary | ICD-10-CM | POA: Diagnosis not present

## 2022-08-14 DIAGNOSIS — Z95810 Presence of automatic (implantable) cardiac defibrillator: Secondary | ICD-10-CM

## 2022-08-14 NOTE — Progress Notes (Signed)
EPIC Encounter for ICM Monitoring  Patient Name: Daniel Li is a 66 y.o. male Date: 08/14/2022 Primary Care Physican: [name], NP Primary Cardiologist: [facility] Electrophysiologist: Quentin Ore 08/10/2022 Office Weight: 140 lbs                                                  Transmission reviewed.      CorVue thoracic impedance suggesting normal fluid levels with exception of suggesting possible fluid accumulation from 9/21-9/30.      Prescribed dosage:  Torsemide 20 mg Take 20 mg by mouth every other day Potassium 10 mEq take 1 tablet by mouth every other day Spironolactone 25 mg take 0.5 tablet (12.5 mg total) daily   Labs: 08/10/2022 Creatinine 1.50, BUN 15, Potassium 4.3, Sodium 140, GFR 51 04/05/2022 Creatinine 1.46, BUN 12, Potassium 4.4, Sodium 142, GFR 53 03/23/2022 Creatinine 1.03, BUN 11, Potassium 4.1, Sodium 140, GFR >60 03/22/2022 Creatinine 1.11, BUN 9,   Potassium 4.4, Sodium 141, GFR >60 02/19/2022 Creatinine 1.00, BUN 11, Potassium 3.9, Sodium 141, GFR >60 12/29/2021 Creatinine 1.10, BUN 14, Potassium 3.4, Sodium 145 Care Everywhere 12/28/2021 Creatinine 0.96, BUN 10, Potassium 4.0, Sodium 145 Care Everywhere 12/26/2021 Creatinine 1.07, BUN 9,   Potassium 4.0, Sodium 145 Care Everywhere A complete set of results can be found in Results Review.   Recommendations:  No changes   Follow-up plan: ICM clinic phone appointment on 09/24/2022.    91 day device clinic remote transmission 11/12/2022.     EP/Cardiology Office Visits:  Next appt due 12/11/2022 (4 mo f/u) with HF clinic (no recall).  Recall 04/04/2023 with Ricke Hey, PA.  08/14/2022 with Dr Harl Bowie.   Copy of ICM check sent to Dr. Quentin Ore.   3 month ICM trend: 08/13/2022.    12-14 Month ICM trend:     Rosalene Billings, RN 08/14/2022 7:41 AM

## 2022-08-15 ENCOUNTER — Telehealth (HOSPITAL_COMMUNITY): Payer: Self-pay

## 2022-08-15 DIAGNOSIS — I5022 Chronic systolic (congestive) heart failure: Secondary | ICD-10-CM

## 2022-08-15 MED ORDER — TORSEMIDE 20 MG PO TABS: 20.0000 mg | ORAL_TABLET | Freq: Every day | ORAL | 5 refills | Status: DC | PRN

## 2022-08-15 NOTE — Telephone Encounter (Signed)
Patient advised and verbalized understanding,lab appointment scheduled,lab orders entered. Med list updated to reflect changes.   Meds ordered this encounter  Medications   torsemide (DEMADEX) 20 MG tablet    Sig: Take 1 tablet (20 mg total) by mouth daily as needed (for swelling or 3 pound weight gain).    Dispense:  30 tablet    Refill:  5    Please cancel all previous orders for current medication. Change in dosage or pill size.   Orders Placed This Encounter  Procedures   Basic metabolic panel    Standing Status:   Future    Standing Expiration Date:   08/16/2023    Order Specific Question:   Release to patient    Answer:   Immediate   Digoxin level    Standing Status:   Future    Standing Expiration Date:   08/16/2023    Order Specific Question:   Release to patient    Answer:   Immediate

## 2022-08-15 NOTE — Telephone Encounter (Signed)
-----   Message from Rafael Bihari, [place] sent at 08/13/2022  8:19 AM EDT ----- Kidney function elevated.  Change torsemide to 20 mg PRN weight gain/swelling.  Dig level mildly elevated. I am unsure if this was a trough.  Repeat BMET and dig level (as a trough, patient needs to hold digoxin before labs) in 1 week

## 2022-08-21 ENCOUNTER — Ambulatory Visit (HOSPITAL_COMMUNITY)
Admission: RE | Admit: 2022-08-21 | Discharge: 2022-08-21 | Disposition: A | Discharge status: Home / Self Care | Payer: Medicare Other | Source: Ambulatory Visit | Attending: Cardiology | Admitting: Cardiology

## 2022-08-21 ENCOUNTER — Encounter: Payer: Medicare Other | Admitting: *Deleted

## 2022-08-21 DIAGNOSIS — I5022 Chronic systolic (congestive) heart failure: Secondary | ICD-10-CM | POA: Diagnosis present

## 2022-08-21 DIAGNOSIS — Z006 Encounter for examination for normal comparison and control in clinical research program: Secondary | ICD-10-CM

## 2022-08-21 LAB — BASIC METABOLIC PANEL
Anion gap: 9 (ref 5–15)
BUN: 14 mg/dL (ref 8–23)
CO2: 28 mmol/L (ref 22–32)
Calcium: 9.8 mg/dL (ref 8.9–10.3)
Chloride: 103 mmol/L (ref 98–111)
Creatinine, Ser: 1.62 mg/dL — ABNORMAL HIGH (ref 0.61–1.24)
GFR, Estimated: 47 mL/min — ABNORMAL LOW (ref >60)
Glucose, Bld: 110 mg/dL — ABNORMAL HIGH (ref 70–99)
Potassium: 4.6 mmol/L (ref 3.5–5.1)
Sodium: 140 mmol/L (ref 135–145)

## 2022-08-21 LAB — DIGOXIN LEVEL: Digoxin Level: 0.7 ng/mL — ABNORMAL LOW (ref 0.8–2.0)

## 2022-08-21 NOTE — Research (Addendum)
ANALOG Informed Consent   Subject Name: Daniel Li  Subject met inclusion and exclusion criteria.  The informed consent form, study requirements and expectations were reviewed with the subject and questions and concerns were addressed prior to the signing of the consent form.  The subject verbalized understanding of the trial requirements.  The subject agreed to participate in the ANALOG  trial and signed the informed consent at 09:41 on 08-21-2022.  The informed consent was obtained prior to performance of any protocol-specific procedures for the subject.  A copy of the signed informed consent was given to the subject and a copy was placed in the subject's medical record.   [place] Marinus Eicher, Research Coordinator 08/21/2022  09:41 am

## 2022-08-22 ENCOUNTER — Encounter: Payer: Self-pay | Admitting: *Deleted

## 2022-08-22 DIAGNOSIS — Z006 Encounter for examination for normal comparison and control in clinical research program: Secondary | ICD-10-CM

## 2022-08-22 NOTE — Research (Signed)
Called patient to complete Visit 1 survey  for ANALOG study and to see how he was doing using the device. I will see him in 6 months for his last visit and told him to call me if he needed me.      [place] Ridgely Anastacio,Research Coordinator 08/22/2022  10:04 a.m.

## 2022-08-30 ENCOUNTER — Telehealth: Payer: Self-pay

## 2022-08-30 NOTE — Progress Notes (Signed)
Remote ICD transmission.   

## 2022-08-30 NOTE — Telephone Encounter (Signed)
Called the patient's home and spoke with Lesleigh Noe to ask about status of dental appointment that was supposed to be completed 10/23. She states the appointment has now been rescheduled to 11/15. Reiterated the importance of keeping this appointment as scheduled as he has a heart valve issue that needs to be addressed pending dental evaluation.

## 2022-09-19 NOTE — Telephone Encounter (Signed)
Spoke with Jocelyn at the patient's residence, who states the patient did attend his dental visit last week.  Attempted to arrange Cardiology follow-up, but Lelon Frohlich (the coordinator) only works on Fridays, Saturdays, and Sundays.  Will call Lelon Frohlich 12/1 to arrange appointment with Dr. Ali Lowe.

## 2022-09-24 ENCOUNTER — Ambulatory Visit (INDEPENDENT_AMBULATORY_CARE_PROVIDER_SITE_OTHER): Payer: Medicare Other

## 2022-09-24 DIAGNOSIS — I5022 Chronic systolic (congestive) heart failure: Secondary | ICD-10-CM

## 2022-09-24 DIAGNOSIS — Z95810 Presence of automatic (implantable) cardiac defibrillator: Secondary | ICD-10-CM | POA: Diagnosis not present

## 2022-09-26 NOTE — Progress Notes (Signed)
EPIC Encounter for ICM Monitoring  Patient Name: Daniel Li is a 66 y.o. male Date: 09/26/2022 Primary Care Physican: [name], NP Primary Cardiologist: [facility] Electrophysiologist: Quentin Ore 08/10/2022 Office Weight: 140 lbs                                                  Transmission reviewed.      CorVue thoracic impedance suggesting normal fluid levels.      Prescribed dosage:  Torsemide 20 mg Take 1 tablet by mouth as needed for swelling or 3 pound weight gain Potassium 10 mEq take 1 tablet by mouth every other day Spironolactone 25 mg take 0.5 tablet (12.5 mg total) daily   Labs: 08/21/2022 Creatinine 1.62, BUN 14, Potassium 4.6, Sodium 140, GFR 47 08/10/2022 Creatinine 1.50, BUN 15, Potassium 4.3, Sodium 140, GFR 51 04/05/2022 Creatinine 1.46, BUN 12, Potassium 4.4, Sodium 142, GFR 53 03/23/2022 Creatinine 1.03, BUN 11, Potassium 4.1, Sodium 140, GFR >60 03/22/2022 Creatinine 1.11, BUN 9,   Potassium 4.4, Sodium 141, GFR >60 02/19/2022 Creatinine 1.00, BUN 11, Potassium 3.9, Sodium 141, GFR >60 12/29/2021 Creatinine 1.10, BUN 14, Potassium 3.4, Sodium 145 Care Everywhere 12/28/2021 Creatinine 0.96, BUN 10, Potassium 4.0, Sodium 145 Care Everywhere 12/26/2021 Creatinine 1.07, BUN 9,   Potassium 4.0, Sodium 145 Care Everywhere A complete set of results can be found in Results Review.   Recommendations:  No changes   Follow-up plan: ICM clinic phone appointment on 11/05/2022.    91 day device clinic remote transmission 11/12/2022.     EP/Cardiology Office Visits:  Next appt due 12/11/2022 (4 mo f/u) with HF clinic (no recall).  Recall 04/04/2023 with Ricke Hey, PA.     Copy of ICM check sent to Dr. Quentin Ore.    3 month ICM trend: 09/24/2022.    12-14 Month ICM trend:     Rosalene Billings, RN 09/26/2022 7:49 AM

## 2022-09-28 ENCOUNTER — Telehealth: Payer: Self-pay | Admitting: Cardiology

## 2022-09-28 NOTE — Telephone Encounter (Signed)
  [facility]   MULTIDISCIPLINARY HEART VALVE TEAM  Attempted to call Daniel Li x2 this morning ([phone]) who helps arrange transportation for Daniel Li with no answer or ability to leave a message. We are attempting to schedule a follow up visit with Dr. Ali Lowe for ongoing TEER workup after his dental evaluation/extraction.   [name] NP-C Structural Heart Team  Pager: [phone] Phone: [phone]

## 2022-09-28 NOTE — Telephone Encounter (Signed)
  [facility]   MULTIDISCIPLINARY HEART VALVE TEAM   Able to reach Lelon Frohlich regarding Daniel Li. He was seen by [facility] with plans for extractions however they are requiring cardiac clearance prior to scheduling. Attempted to call dental office but they are closed today. Will attempt next week. Plan will be to have them fax a clearance letter to the [facility] office and will fax back so that he can be fast tracked through this process.   He has been scheduled to see Dr. Ali Lowe for follow up 11/07/22 and Lelon Frohlich is aware of date/time/location and need for transportation.   [name] NP-C Structural Heart Team  Pager: [phone] Phone: [phone]

## 2022-10-04 ENCOUNTER — Other Ambulatory Visit (HOSPITAL_COMMUNITY): Payer: Self-pay | Admitting: Cardiology

## 2022-10-11 NOTE — Telephone Encounter (Signed)
Since no clearance has been received, called [facility].  Per Colletta [place], she will email a clearance.

## 2022-10-15 NOTE — Telephone Encounter (Signed)
Confirmed with [facility] clearance was received.

## 2022-10-16 ENCOUNTER — Telehealth: Payer: Self-pay

## 2022-10-16 NOTE — Telephone Encounter (Signed)
Filled out by [name], NP:

## 2022-11-05 ENCOUNTER — Ambulatory Visit (INDEPENDENT_AMBULATORY_CARE_PROVIDER_SITE_OTHER): Payer: Medicare Other

## 2022-11-05 DIAGNOSIS — I5022 Chronic systolic (congestive) heart failure: Secondary | ICD-10-CM

## 2022-11-05 DIAGNOSIS — Z95810 Presence of automatic (implantable) cardiac defibrillator: Secondary | ICD-10-CM | POA: Diagnosis not present
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
## 2022-11-07 ENCOUNTER — Ambulatory Visit: Payer: Medicare Other | Admitting: Internal Medicine

## 2022-11-07 DIAGNOSIS — E1169 Type 2 diabetes mellitus with other specified complication: Secondary | ICD-10-CM

## 2022-11-07 DIAGNOSIS — Z95810 Presence of automatic (implantable) cardiac defibrillator: Secondary | ICD-10-CM

## 2022-11-07 DIAGNOSIS — E1159 Type 2 diabetes mellitus with other circulatory complications: Secondary | ICD-10-CM

## 2022-11-07 DIAGNOSIS — N1831 Chronic kidney disease, stage 3a: Secondary | ICD-10-CM

## 2022-11-07 DIAGNOSIS — I2510 Atherosclerotic heart disease of native coronary artery without angina pectoris: Secondary | ICD-10-CM

## 2022-11-07 DIAGNOSIS — I340 Nonrheumatic mitral (valve) insufficiency: Secondary | ICD-10-CM

## 2022-11-07 DIAGNOSIS — I5022 Chronic systolic (congestive) heart failure: Secondary | ICD-10-CM

## 2022-11-07 DIAGNOSIS — I739 Peripheral vascular disease, unspecified: Secondary | ICD-10-CM

## 2022-11-12 ENCOUNTER — Ambulatory Visit (INDEPENDENT_AMBULATORY_CARE_PROVIDER_SITE_OTHER): Payer: Medicare Other

## 2022-11-12 DIAGNOSIS — I5022 Chronic systolic (congestive) heart failure: Secondary | ICD-10-CM

## 2022-11-12 NOTE — Progress Notes (Signed)
EPIC Encounter for ICM Monitoring  Patient Name: Daniel Li is a 67 y.o. male Date: 11/12/2022 Primary Care Physican: [name], NP Primary Cardiologist: [facility] Electrophysiologist: Quentin Ore 08/10/2022 Office Weight: 140 lbs                                                  Transmission reviewed.      CorVue thoracic impedance suggesting possible dryness starting 1/8 but returned to baseline normal 1/12.      Prescribed dosage:  Torsemide 20 mg Take 1 tablet by mouth as needed for swelling or 3 pound weight gain Potassium 10 mEq take 1 tablet by mouth every other day Spironolactone 25 mg take 0.5 tablet (12.5 mg total) daily   Labs: 08/21/2022 Creatinine 1.62, BUN 14, Potassium 4.6, Sodium 140, GFR 47 08/10/2022 Creatinine 1.50, BUN 15, Potassium 4.3, Sodium 140, GFR 51 04/05/2022 Creatinine 1.46, BUN 12, Potassium 4.4, Sodium 142, GFR 53 03/23/2022 Creatinine 1.03, BUN 11, Potassium 4.1, Sodium 140, GFR >60 03/22/2022 Creatinine 1.11, BUN 9,   Potassium 4.4, Sodium 141, GFR >60 02/19/2022 Creatinine 1.00, BUN 11, Potassium 3.9, Sodium 141, GFR >60 12/29/2021 Creatinine 1.10, BUN 14, Potassium 3.4, Sodium 145 Care Everywhere 12/28/2021 Creatinine 0.96, BUN 10, Potassium 4.0, Sodium 145 Care Everywhere 12/26/2021 Creatinine 1.07, BUN 9,   Potassium 4.0, Sodium 145 Care Everywhere A complete set of results can be found in Results Review.   Recommendations:  No changes   Follow-up plan: ICM clinic phone appointment on 12/10/2022.    91 day device clinic remote transmission /15/2024.     EP/Cardiology Office Visits:  11/23/2022 with Dr Ali Lowe.   Next appt due 12/11/2022 (4 mo f/u) with HF clinic (no recall).  Recall 04/04/2023 with Ricke Hey, PA.     Copy of ICM check sent to Dr. Quentin Ore.    Direct Trend View through 11/09/2022    3 month ICM trend: 11/06/2022.    12-14 Month ICM trend:     Rosalene Billings, RN 11/12/2022 2:03 PM

## 2022-11-14 LAB — CUP PACEART REMOTE DEVICE CHECK
Battery Remaining Longevity: 59 mo
Battery Remaining Percentage: 56 %
Battery Voltage: 2.96 V
Brady Statistic RV Percent Paced: 1 %
Date Time Interrogation Session: 20240117113512
HighPow Impedance: 62 Ohm
HighPow Impedance: 62 Ohm
Implantable Lead Connection Status: 753985
Implantable Lead Implant Date: 20181120
Implantable Lead Location: 753860
Implantable Pulse Generator Implant Date: 20181120
Lead Channel Impedance Value: 330 Ohm
Lead Channel Pacing Threshold Amplitude: 0.75 V
Lead Channel Pacing Threshold Pulse Width: 0.5 ms
Lead Channel Sensing Intrinsic Amplitude: 12 mV
Lead Channel Setting Pacing Amplitude: 2.5 V
Lead Channel Setting Pacing Pulse Width: 0.5 ms
Lead Channel Setting Sensing Sensitivity: 0.5 mV
Pulse Gen Serial Number: 9786940
Zone Setting Status: 755011
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
## 2022-11-23 ENCOUNTER — Ambulatory Visit: Payer: Medicare Other | Attending: Internal Medicine | Admitting: Internal Medicine

## 2022-11-23 ENCOUNTER — Encounter: Payer: Self-pay | Admitting: Internal Medicine

## 2022-11-23 VITALS — BP 105/68 | HR 70 | Ht 66.5 in | Wt 146.0 lb

## 2022-11-23 DIAGNOSIS — Z95810 Presence of automatic (implantable) cardiac defibrillator: Secondary | ICD-10-CM | POA: Diagnosis not present

## 2022-11-23 DIAGNOSIS — I340 Nonrheumatic mitral (valve) insufficiency: Secondary | ICD-10-CM | POA: Diagnosis not present

## 2022-11-23 DIAGNOSIS — N1831 Chronic kidney disease, stage 3a: Secondary | ICD-10-CM

## 2022-11-23 DIAGNOSIS — I5022 Chronic systolic (congestive) heart failure: Secondary | ICD-10-CM | POA: Insufficient documentation

## 2022-11-23 DIAGNOSIS — E785 Hyperlipidemia, unspecified: Secondary | ICD-10-CM | POA: Diagnosis present

## 2022-11-23 DIAGNOSIS — I2510 Atherosclerotic heart disease of native coronary artery without angina pectoris: Secondary | ICD-10-CM | POA: Diagnosis not present

## 2022-11-23 DIAGNOSIS — E1169 Type 2 diabetes mellitus with other specified complication: Secondary | ICD-10-CM

## 2022-11-23 DIAGNOSIS — E1159 Type 2 diabetes mellitus with other circulatory complications: Secondary | ICD-10-CM

## 2022-11-23 DIAGNOSIS — I739 Peripheral vascular disease, unspecified: Secondary | ICD-10-CM | POA: Diagnosis present

## 2022-11-23 MED ORDER — ASPIRIN 81 MG PO TBEC: 81.0000 mg | DELAYED_RELEASE_TABLET | Freq: Every day | ORAL | 3 refills | Status: DC

## 2022-11-23 NOTE — Patient Instructions (Signed)
Medication Instructions:  No changes *If you need a refill on your cardiac medications before your next appointment, please call your pharmacy*   Lab Work: none   Testing/Procedures: none   Follow-Up: Per Structural Heart Team

## 2022-11-27 ENCOUNTER — Telehealth: Payer: Self-pay | Admitting: Cardiology

## 2022-11-27 NOTE — Telephone Encounter (Signed)
Called [facility] regarding patients dental appointment with extraction plan. Unfortunately he was last seen by their team 09/17/22 with recommendations to move forward with dental extractions. He has no future appointments scheduled with them. I will fax Dr. Dara Hoyer follow up noted stating that he is at low risk from a CV standpoint to move forward with extractions. Will continue to follow.   [name] NP-C Structural Heart Team  Pager: [phone] Phone: [phone]

## 2022-11-30 ENCOUNTER — Other Ambulatory Visit (HOSPITAL_COMMUNITY): Payer: Self-pay | Admitting: Cardiology

## 2022-12-05 NOTE — Telephone Encounter (Signed)
Spoke with Marita [place] at [facility]. She stated the patient has no follow-up scheduled. Reiterated to her that the patient will need his teeth extracted prior to heart valve surgery. She will call and try to arrange appointment today. Will call her back tomorrow to confirm appointment is made.  [facility]: [phone]

## 2022-12-09 ENCOUNTER — Other Ambulatory Visit: Payer: Self-pay

## 2022-12-09 ENCOUNTER — Emergency Department (HOSPITAL_COMMUNITY): Payer: Medicare Other

## 2022-12-09 ENCOUNTER — Observation Stay (HOSPITAL_COMMUNITY)
Admission: EM | Admit: 2022-12-09 | Discharge: 2022-12-11 | Disposition: A | Discharge status: Home Health | Payer: Medicare Other | Attending: Internal Medicine | Admitting: Internal Medicine

## 2022-12-09 DIAGNOSIS — I130 Hypertensive heart and chronic kidney disease with heart failure and stage 1 through stage 4 chronic kidney disease, or unspecified chronic kidney disease: Secondary | ICD-10-CM | POA: Diagnosis not present

## 2022-12-09 DIAGNOSIS — N179 Acute kidney failure, unspecified: Secondary | ICD-10-CM | POA: Diagnosis present

## 2022-12-09 DIAGNOSIS — Z8673 Personal history of transient ischemic attack (TIA), and cerebral infarction without residual deficits: Secondary | ICD-10-CM | POA: Insufficient documentation

## 2022-12-09 DIAGNOSIS — Z95810 Presence of automatic (implantable) cardiac defibrillator: Secondary | ICD-10-CM | POA: Diagnosis not present

## 2022-12-09 DIAGNOSIS — Z7984 Long term (current) use of oral hypoglycemic drugs: Secondary | ICD-10-CM | POA: Diagnosis not present

## 2022-12-09 DIAGNOSIS — Z955 Presence of coronary angioplasty implant and graft: Secondary | ICD-10-CM | POA: Insufficient documentation

## 2022-12-09 DIAGNOSIS — F17210 Nicotine dependence, cigarettes, uncomplicated: Secondary | ICD-10-CM | POA: Diagnosis not present

## 2022-12-09 DIAGNOSIS — Z7982 Long term (current) use of aspirin: Secondary | ICD-10-CM | POA: Diagnosis not present

## 2022-12-09 DIAGNOSIS — I2510 Atherosclerotic heart disease of native coronary artery without angina pectoris: Secondary | ICD-10-CM | POA: Diagnosis not present

## 2022-12-09 DIAGNOSIS — F1990 Other psychoactive substance use, unspecified, uncomplicated: Secondary | ICD-10-CM | POA: Diagnosis present

## 2022-12-09 DIAGNOSIS — N1831 Chronic kidney disease, stage 3a: Secondary | ICD-10-CM | POA: Insufficient documentation

## 2022-12-09 DIAGNOSIS — R42 Dizziness and giddiness: Principal | ICD-10-CM

## 2022-12-09 DIAGNOSIS — E118 Type 2 diabetes mellitus with unspecified complications: Secondary | ICD-10-CM | POA: Diagnosis present

## 2022-12-09 DIAGNOSIS — R7989 Other specified abnormal findings of blood chemistry: Secondary | ICD-10-CM | POA: Insufficient documentation

## 2022-12-09 DIAGNOSIS — K219 Gastro-esophageal reflux disease without esophagitis: Secondary | ICD-10-CM | POA: Diagnosis present

## 2022-12-09 DIAGNOSIS — I5042 Chronic combined systolic (congestive) and diastolic (congestive) heart failure: Secondary | ICD-10-CM | POA: Diagnosis not present

## 2022-12-09 DIAGNOSIS — R079 Chest pain, unspecified: Secondary | ICD-10-CM | POA: Diagnosis present

## 2022-12-09 DIAGNOSIS — I340 Nonrheumatic mitral (valve) insufficiency: Secondary | ICD-10-CM | POA: Diagnosis present

## 2022-12-09 DIAGNOSIS — I10 Essential (primary) hypertension: Secondary | ICD-10-CM | POA: Diagnosis present

## 2022-12-09 DIAGNOSIS — R9431 Abnormal electrocardiogram [ECG] [EKG]: Secondary | ICD-10-CM | POA: Insufficient documentation

## 2022-12-09 DIAGNOSIS — Z79899 Other long term (current) drug therapy: Secondary | ICD-10-CM | POA: Insufficient documentation

## 2022-12-09 DIAGNOSIS — Z7902 Long term (current) use of antithrombotics/antiplatelets: Secondary | ICD-10-CM | POA: Insufficient documentation

## 2022-12-09 DIAGNOSIS — E1122 Type 2 diabetes mellitus with diabetic chronic kidney disease: Secondary | ICD-10-CM | POA: Diagnosis not present

## 2022-12-09 DIAGNOSIS — R782 Finding of cocaine in blood: Secondary | ICD-10-CM | POA: Diagnosis not present

## 2022-12-09 LAB — COMPREHENSIVE METABOLIC PANEL
ALT: 24 U/L (ref 0–44)
AST: 36 U/L (ref 15–41)
Albumin: 3.9 g/dL (ref 3.5–5.0)
Alkaline Phosphatase: 41 U/L (ref 38–126)
Anion gap: 11 (ref 5–15)
BUN: 13 mg/dL (ref 8–23)
CO2: 26 mmol/L (ref 22–32)
Calcium: 9.3 mg/dL (ref 8.9–10.3)
Chloride: 102 mmol/L (ref 98–111)
Creatinine, Ser: 1.33 mg/dL — ABNORMAL HIGH (ref 0.61–1.24)
GFR, Estimated: 59 mL/min — ABNORMAL LOW (ref >60)
Glucose, Bld: 73 mg/dL (ref 70–99)
Potassium: 4.3 mmol/L (ref 3.5–5.1)
Sodium: 139 mmol/L (ref 135–145)
Total Bilirubin: 0.5 mg/dL (ref 0.3–1.2)
Total Protein: 7.4 g/dL (ref 6.5–8.1)

## 2022-12-09 LAB — HEMOGLOBIN A1C
Hgb A1c MFr Bld: 6.2 % — ABNORMAL HIGH (ref 4.8–5.6)
Mean Plasma Glucose: 131.24 mg/dL

## 2022-12-09 LAB — CBC
HCT: 44.5 % (ref 39.0–52.0)
Hemoglobin: 14.4 g/dL (ref 13.0–17.0)
MCH: 25.5 pg — ABNORMAL LOW (ref 26.0–34.0)
MCHC: 32.4 g/dL (ref 30.0–36.0)
MCV: 78.9 fL — ABNORMAL LOW (ref 80.0–100.0)
Platelets: 269 10*3/uL (ref 150–400)
RBC: 5.64 MIL/uL (ref 4.22–5.81)
RDW: 16.9 % — ABNORMAL HIGH (ref 11.5–15.5)
WBC: 5.9 10*3/uL (ref 4.0–10.5)
nRBC: 0 % (ref 0.0–0.2)

## 2022-12-09 LAB — TROPONIN I (HIGH SENSITIVITY)
Troponin I (High Sensitivity): 21 ng/L — ABNORMAL HIGH (ref <18)
Troponin I (High Sensitivity): 25 ng/L — ABNORMAL HIGH (ref <18)

## 2022-12-09 LAB — GLUCOSE, CAPILLARY
Glucose-Capillary: 97 mg/dL (ref 70–99)
Glucose-Capillary: 152 mg/dL — ABNORMAL HIGH (ref 70–99)

## 2022-12-09 LAB — DIGOXIN LEVEL: Digoxin Level: 1.1 ng/mL (ref 0.8–2.0)

## 2022-12-09 LAB — BRAIN NATRIURETIC PEPTIDE: B Natriuretic Peptide: 171 pg/mL — ABNORMAL HIGH (ref 0.0–100.0)

## 2022-12-09 MED ORDER — EMPAGLIFLOZIN 10 MG PO TABS
10.0000 mg | ORAL_TABLET | Freq: Every day | ORAL | Status: DC
  Administered 2022-12-10 – 2022-12-11 (×2): 10 mg via ORAL
  Filled 2022-12-09 (×2): qty 1

## 2022-12-09 MED ORDER — CYCLOBENZAPRINE HCL 10 MG PO TABS
5.0000 mg | ORAL_TABLET | Freq: Three times a day (TID) | ORAL | Status: DC
  Administered 2022-12-09 – 2022-12-10 (×3): 5 mg via ORAL
  Filled 2022-12-09 (×3): qty 1

## 2022-12-09 MED ORDER — EZETIMIBE 10 MG PO TABS
10.0000 mg | ORAL_TABLET | Freq: Every day | ORAL | Status: DC
  Administered 2022-12-10 – 2022-12-11 (×2): 10 mg via ORAL
  Filled 2022-12-09 (×2): qty 1

## 2022-12-09 MED ORDER — CLOPIDOGREL BISULFATE 75 MG PO TABS
75.0000 mg | ORAL_TABLET | Freq: Every day | ORAL | Status: DC
  Administered 2022-12-10 – 2022-12-11 (×2): 75 mg via ORAL
  Filled 2022-12-09 (×2): qty 1

## 2022-12-09 MED ORDER — INSULIN ASPART 100 UNIT/ML IJ SOLN: 0.0000 [IU] | Freq: Three times a day (TID) | INTRAMUSCULAR | Status: DC

## 2022-12-09 MED ORDER — HEPARIN SODIUM (PORCINE) 5000 UNIT/ML IJ SOLN
5000.0000 [IU] | Freq: Three times a day (TID) | INTRAMUSCULAR | Status: DC
  Administered 2022-12-09 – 2022-12-11 (×5): 5000 [IU] via SUBCUTANEOUS
  Filled 2022-12-09 (×5): qty 1

## 2022-12-09 MED ORDER — GABAPENTIN 100 MG PO CAPS
100.0000 mg | ORAL_CAPSULE | Freq: Two times a day (BID) | ORAL | Status: DC
  Administered 2022-12-09 – 2022-12-10 (×2): 100 mg via ORAL
  Filled 2022-12-09 (×2): qty 1

## 2022-12-09 MED ORDER — ACETAMINOPHEN 650 MG RE SUPP: 650.0000 mg | RECTAL | Status: DC | PRN

## 2022-12-09 MED ORDER — STROKE: EARLY STAGES OF RECOVERY BOOK: Freq: Once | Status: AC

## 2022-12-09 MED ORDER — AMIODARONE HCL 200 MG PO TABS
200.0000 mg | ORAL_TABLET | Freq: Every day | ORAL | Status: DC
  Administered 2022-12-10 – 2022-12-11 (×2): 200 mg via ORAL
  Filled 2022-12-09 (×3): qty 1

## 2022-12-09 MED ORDER — OXYCODONE HCL 5 MG PO TABS
5.0000 mg | ORAL_TABLET | ORAL | Status: DC | PRN
  Administered 2022-12-09 – 2022-12-11 (×6): 5 mg via ORAL
  Filled 2022-12-09 (×6): qty 1

## 2022-12-09 MED ORDER — NITROGLYCERIN 0.4 MG SL SUBL
0.4000 mg | SUBLINGUAL_TABLET | Freq: Once | SUBLINGUAL | Status: AC
  Administered 2022-12-09: 0.4 mg via SUBLINGUAL
  Filled 2022-12-09: qty 1

## 2022-12-09 MED ORDER — ACETAMINOPHEN 160 MG/5ML PO SOLN: 650.0000 mg | ORAL | Status: DC | PRN

## 2022-12-09 MED ORDER — ACETAMINOPHEN 325 MG PO TABS
650.0000 mg | ORAL_TABLET | ORAL | Status: DC | PRN
  Administered 2022-12-11: 650 mg via ORAL
  Filled 2022-12-09: qty 2

## 2022-12-09 MED ORDER — PANTOPRAZOLE SODIUM 40 MG PO TBEC
40.0000 mg | DELAYED_RELEASE_TABLET | Freq: Every day | ORAL | Status: DC
  Administered 2022-12-10 – 2022-12-11 (×2): 40 mg via ORAL
  Filled 2022-12-09 (×2): qty 1

## 2022-12-09 MED ORDER — DIGOXIN 125 MCG PO TABS
0.1250 mg | ORAL_TABLET | Freq: Every day | ORAL | Status: DC
  Administered 2022-12-10: 0.125 mg via ORAL
  Filled 2022-12-09: qty 1

## 2022-12-09 MED ORDER — MECLIZINE HCL 12.5 MG PO TABS
25.0000 mg | ORAL_TABLET | Freq: Three times a day (TID) | ORAL | Status: DC
  Administered 2022-12-09 – 2022-12-11 (×6): 25 mg via ORAL
  Filled 2022-12-09 (×6): qty 2

## 2022-12-09 MED ORDER — ASPIRIN 81 MG PO TBEC
81.0000 mg | DELAYED_RELEASE_TABLET | Freq: Every day | ORAL | Status: DC
  Administered 2022-12-09 – 2022-12-11 (×3): 81 mg via ORAL
  Filled 2022-12-09 (×3): qty 1

## 2022-12-09 MED ORDER — LIDOCAINE 5 % EX PTCH
2.0000 | MEDICATED_PATCH | CUTANEOUS | Status: DC
  Administered 2022-12-09 – 2022-12-10 (×2): 2 via TRANSDERMAL
  Filled 2022-12-09 (×2): qty 2

## 2022-12-09 MED ORDER — INSULIN ASPART 100 UNIT/ML IJ SOLN: 0.0000 [IU] | Freq: Every day | INTRAMUSCULAR | Status: DC
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
## 2022-12-09 NOTE — ED Triage Notes (Signed)
Pt bib EMS from [facility], c/o chest pain and dizziness x 4 days, radiating to back. Per EMS, pt has ST elevation and they contacted [facility] and provider at [facility] states pt has chronic ST elevations and start at [facility]. Was at [facility] yesterday for same, dcd from ER. H/O heart attack.  111/74 HR 86 Nitro x 1 given ASA 364m Fentanyl 53m IV
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
## 2022-12-10 ENCOUNTER — Observation Stay (HOSPITAL_COMMUNITY): Payer: Medicare Other

## 2022-12-10 ENCOUNTER — Observation Stay (HOSPITAL_BASED_OUTPATIENT_CLINIC_OR_DEPARTMENT_OTHER): Payer: Medicare Other

## 2022-12-10 ENCOUNTER — Other Ambulatory Visit (HOSPITAL_COMMUNITY): Payer: Self-pay | Admitting: *Deleted

## 2022-12-10 ENCOUNTER — Encounter (HOSPITAL_COMMUNITY): Payer: Self-pay | Admitting: Internal Medicine

## 2022-12-10 DIAGNOSIS — I340 Nonrheumatic mitral (valve) insufficiency: Secondary | ICD-10-CM

## 2022-12-10 DIAGNOSIS — R42 Dizziness and giddiness: Secondary | ICD-10-CM | POA: Diagnosis not present

## 2022-12-10 LAB — RAPID URINE DRUG SCREEN, HOSP PERFORMED
Amphetamines: NOT DETECTED
Barbiturates: NOT DETECTED
Benzodiazepines: NOT DETECTED
Cocaine: POSITIVE — AB
Opiates: NOT DETECTED
Tetrahydrocannabinol: NOT DETECTED

## 2022-12-10 LAB — GLUCOSE, CAPILLARY
Glucose-Capillary: 92 mg/dL (ref 70–99)
Glucose-Capillary: 97 mg/dL (ref 70–99)
Glucose-Capillary: 115 mg/dL — ABNORMAL HIGH (ref 70–99)
Glucose-Capillary: 128 mg/dL — ABNORMAL HIGH (ref 70–99)

## 2022-12-10 LAB — ECHOCARDIOGRAM COMPLETE
Area-P 1/2: 3.99 cm2
Calc EF: 20.5 %
Est EF: 20
Height: 66 in
S' Lateral: 6.4 cm
Single Plane A2C EF: 8.7 %
Single Plane A4C EF: 20.7 %
Weight: 2336 oz

## 2022-12-10 LAB — LIPID PANEL
Cholesterol: 128 mg/dL (ref 0–200)
HDL: 60 mg/dL (ref >40)
LDL Cholesterol: 45 mg/dL (ref 0–99)
Total CHOL/HDL Ratio: 2.1 RATIO
Triglycerides: 115 mg/dL (ref <150)
VLDL: 23 mg/dL (ref 0–40)

## 2022-12-10 LAB — HEMOGLOBIN A1C
Hgb A1c MFr Bld: 6.3 % — ABNORMAL HIGH (ref 4.8–5.6)
Mean Plasma Glucose: 134.11 mg/dL

## 2022-12-10 MED ORDER — GABAPENTIN 100 MG PO CAPS
200.0000 mg | ORAL_CAPSULE | Freq: Three times a day (TID) | ORAL | Status: DC
  Administered 2022-12-10 – 2022-12-11 (×3): 200 mg via ORAL
  Filled 2022-12-10 (×3): qty 2

## 2022-12-10 MED ORDER — PERFLUTREN LIPID MICROSPHERE
1.0000 mL | INTRAVENOUS | Status: AC | PRN
  Administered 2022-12-10: 5 mL via INTRAVENOUS

## 2022-12-10 MED ORDER — MECLIZINE HCL 25 MG PO TABS: 25.0000 mg | ORAL_TABLET | Freq: Three times a day (TID) | ORAL | 0 refills | Status: DC

## 2022-12-10 MED ORDER — CYCLOBENZAPRINE HCL 10 MG PO TABS: 10.0000 mg | ORAL_TABLET | Freq: Three times a day (TID) | ORAL | 0 refills | Status: DC

## 2022-12-10 MED ORDER — CYCLOBENZAPRINE HCL 10 MG PO TABS
10.0000 mg | ORAL_TABLET | Freq: Three times a day (TID) | ORAL | Status: DC
  Administered 2022-12-10 – 2022-12-11 (×3): 10 mg via ORAL
  Filled 2022-12-10 (×3): qty 1
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
## 2022-12-10 NOTE — TOC Initial Note (Signed)
Transition of Care [facility]) - Initial/Assessment Note    Patient Details  Name: Daniel Li MRN: [id number] Date of Birth: [birth year]  [facility] CM/SW Contact:    Iona Beard, [place] Phone Number: 12/10/2022, 4:08 PM  Clinical Narrative:                 CSW noted perchart review that pt is from assisted living facility. CSW spoke to [facility] who state pt is a resident and can return at D/C. CSW spoke to [name] who is over the facility, he states meds need to be sent to [facility] in [place]. CSW updated MD of this. CSW spoke with [name] about [facility] PT/OT, they do not have an agency preference. CSW spoke to [place] with [place] who accepts [facility] referral. MD placed orders. TOC to follow.   Expected Discharge Plan: Assisted Living Barriers to Discharge: Continued Medical Work up   Patient Goals and CMS Choice Patient states their goals for this hospitalization and ongoing recovery are:: return to ALF CMS Medicare.gov Compare Post Acute Care list provided to:: Patient Choice offered to / list presented to : Patient      Expected Discharge Plan and Services In-house Referral: Clinical Social Work Discharge Planning Services: CM Consult Post Acute Care Choice: [place] arrangements for the past 2 months: Assisted Living Facility                           HH Arranged: PT, OT [facility] Agency: [place] Date [place]: 12/10/22   Representative spoke with at [place]: [name]  Prior Living Arrangements/Services Living arrangements for the past 2 months: [place] Lives with:: Facility Resident Patient language and need for interpreter reviewed:: Yes Do you feel safe going back to the place where you live?: Yes      Need for Family Participation in Patient Care: Yes (Comment) Care giver support system in place?: Yes (comment)   Criminal Activity/Legal Involvement Pertinent to Current  Situation/Hospitalization: No - Comment as needed  Activities of Daily Living Home Assistive Devices/Equipment: Gilford Rile (specify type) ADL Screening (condition at time of admission) Patient's cognitive ability adequate to safely complete daily activities?: Yes Is the patient deaf or have difficulty hearing?: No Does the patient have difficulty seeing, even when wearing glasses/contacts?: No Does the patient have difficulty concentrating, remembering, or making decisions?: No Patient able to express need for assistance with ADLs?: Yes Does the patient have difficulty dressing or bathing?: No Independently performs ADLs?: Yes (appropriate for developmental age) Does the patient have difficulty walking or climbing stairs?: Yes Weakness of Legs: Both Weakness of Arms/Hands: Left  Permission Sought/Granted                  Emotional Assessment Appearance:: Appears stated age Attitude/Demeanor/Rapport: Engaged Affect (typically observed): Accepting Orientation: : Oriented to Self, Oriented to Place, Oriented to Situation, Oriented to  Time Alcohol / Substance Use: Not Applicable Psych Involvement: No (comment)  Admission diagnosis:  TIA (transient ischemic attack) [G45.9] Chest pain, unspecified type [R07.9] Patient Active Problem List   Diagnosis Date Noted   Dizziness 12/09/2022   Cocaine positive 12/09/2022   Mitral regurgitation 03/22/2022   CAD in native artery 08/23/2020   Unstable angina ([place]) 08/22/2020   Status post coronary artery stent placement    Hyponatremia    Gastroesophageal reflux disease    Dehydration with hyponatremia  Acute on chronic renal failure (HCC) 05/25/2018   Abdominal pain, acute, epigastric 05/17/2018   Hernia of abdominal wall 05/17/2018   Presumed Gastritis 05/17/2018   AKI (acute kidney injury) ([place]) 05/16/2018   Generalized abdominal pain    Elevated troponin    Chest pain 04/29/2018   ICD (implantable cardioverter-defibrillator) in  place 03/05/2018   Anticoagulated on Coumadin 03/05/2018   Non-STEMI (non-ST elevated myocardial infarction) ([place]) 02/12/2018   Precordial chest pain [id number]   Chronic systolic CHF (congestive heart failure) ([place]) 02/11/2018   Chronic combined systolic (congestive) and diastolic (congestive) heart failure ([place])    STEMI (ST elevation myocardial infarction) ([place]) 02/21/2017   Ischemic cardiomyopathy 07/20/2014   High risk medication use 01/28/2013   Special screening for malignant neoplasms, colon 01/28/2013   Hypercholesterolemia    Tobacco abuse    Diabetes mellitus type 2 with complications ([place]) [id number]   Benign essential HTN 03/26/2012   CAD S/P percutaneous coronary angioplasty 03/26/2012   PCP:  Glenda Chroman, MD Pharmacy:   [facility], [place] [address] [place] [address] Phone: [phone] Fax: [phone]     Social Determinants of Health (SDOH) Social History: SDOH Screenings   Food Insecurity: No Food Insecurity (12/10/2022)  Housing: Low Risk  (12/10/2022)  Transportation Needs: No Transportation Needs (12/10/2022)  Utilities: Not At Risk (12/10/2022)  Financial Resource Strain: Medium Risk (10/01/2018)  Tobacco Use: High Risk (12/10/2022)   SDOH Interventions:     Readmission Risk Interventions     No data to display

## 2022-12-10 NOTE — Progress Notes (Signed)
*  PRELIMINARY RESULTS* Echocardiogram 2D Echocardiogram has been performed with Definity.  Samuel Germany 12/10/2022, 3:14 PM

## 2022-12-10 NOTE — Care Management Obs Status (Signed)
[place] NOTIFICATION   Patient Details  Name: Daniel Li MRN: [id number] Date of Birth: 04/27/1956   Medicare Observation Status Notification Given:  Yes    Tommy Medal 12/10/2022, 3:11 PM

## 2022-12-10 NOTE — Progress Notes (Signed)
SLP Cancellation Note  Patient Details Name: Daniel Li MRN: [id number] DOB: 1955/12/26   Cancelled treatment:       Reason Eval/Treat Not Completed: SLP screened, no needs identified, will sign off; SLP screened Pt in room. Pt denies any acute changes in swallowing, speech, language, or cognition. MRI negative for acute changes. Pt reports previous CVA with mild speech difficulties. SLE will be deferred at this time. Reconsult if indicated. SLP will sign off.   Thank you,  Genene Churn, [place]  [place] 12/10/2022, 2:03 PM

## 2022-12-10 NOTE — Progress Notes (Addendum)
Mobility Specialist Progress Note:    12/10/22 1300  Mobility  Activity Ambulated with assistance in hallway  Level of Assistance Minimal assist, patient does 75% or more  Assistive Device Front wheel walker  Distance Ambulated (ft) 150 ft  Activity Response Tolerated well  Mobility Referral Yes  $Mobility charge 1 Mobility   Pt was agreeable for mobility. MinA required to stand, MinG while ambulating. Tolerated well, c/o dizziness and LUE pain. Returned pt back to EOB for lunch. All needs met.   Royetta Crochet Mobility Specialist Please contact via Solicitor or  Rehab office at [phone]

## 2022-12-10 NOTE — Plan of Care (Signed)
  Problem: Acute Rehab OT Goals (only OT should resolve) Goal: Pt. Will Perform Grooming Flowsheets (Taken 12/10/2022 0919) Pt Will Perform Grooming:  with modified independence  standing Goal: Pt. Will Perform Lower Body Dressing Flowsheets (Taken 12/10/2022 0919) Pt Will Perform Lower Body Dressing:  with mod assist  with min assist  sitting/lateral leans Goal: Pt. Will Transfer To Toilet Flowsheets (Taken 12/10/2022 0919) Pt Will Transfer to Toilet:  with modified independence  ambulating Goal: Pt/Caregiver Will Perform Home Exercise Program Flowsheets (Taken 12/10/2022 0919) Pt/caregiver will Perform Home Exercise Program:  Increased ROM  Increased strength  Left upper extremity  Independently  Roseline Ebarb OT, MOT

## 2022-12-10 NOTE — Hospital Course (Signed)
Daniel Li is a 67 y.o. male with PMH significant of CAD, chronic combined CHF, AICD implant, HTN, depression, GERD, mitral regurgitation, CVA. Patient presented to the hospital with complaints of left-sided chest pain.  Also vertigo with left-sided weakness.

## 2022-12-10 NOTE — Evaluation (Signed)
Physical Therapy Evaluation Patient Details Name: Daniel Li MRN: [id number] DOB: 1956-07-10 Today's Date: 12/10/2022  History of Present Illness  Daniel Li is a 67 y.o. male with PMH significant of CAD, chronic combined CHF, AICD implant, HTN, depression, GERD, mitral regurgitation, CVA.  Patient presented to the hospital with complaints of left-sided chest pain.  Pain started 4 days ago after he went to the bathroom.  Pain is located on the left side radiating to his left arm.  Pain is feeling like something is pressing on his chest.  Pain gets worse when he is taking a deep breath, pain also gets worse when he is moving his left shoulder.  No fall no trauma no injury reported.  Pain has never been 0 ever since it has started until now.  Also reports shortness of breath.  Also reports nausea along with the pain.  Reports vertigo since last few days as well.  Denies any prior weakness on the left side and is unable to tell me since how long he has weakness on his left side.  No difficulty swallowing.  No difficulty speaking.  Denies any alcohol abuse denies any drug abuse.  Denies any cocaine abuse when informed that his cocaine use was positive on the UDS.  No active smoking reported by the patient.  No pain or swelling in his legs.  No numbness at the time of my evaluation but reports that he feels numb when he puts pressure on his legs on both sides chronically.  Patient is supposed to see dentistry for dental extraction so that mitral regurgitation repair-MitraClip can be scheduled but has not seen them since November.   Clinical Impression  Patient has difficulty attempting to complete sit to stands using SPC due weakness, required use of RW for stability demonstrating good return for use and limited for gait training mostly due to c/o fatigue and occasional buckling of knees without loss of balance.  Patient tolerated sitting up in chair after therapy.  Patient will benefit from  continued skilled physical therapy in hospital and recommended venue below to increase strength, balance, endurance for safe ADLs and gait.         Recommendations for follow up therapy are one component of a multi-disciplinary discharge planning process, led by the attending physician.  Recommendations may be updated based on patient status, additional functional criteria and insurance authorization.  Follow Up Recommendations Home health PT      Assistance Recommended at Discharge Set up Supervision/Assistance  Patient can return home with the following  A little help with walking and/or transfers;A little help with bathing/dressing/bathroom;Help with stairs or ramp for entrance;Assistance with cooking/housework    Equipment Recommendations None recommended by PT  Recommendations for Other Services       Functional Status Assessment Patient has had a recent decline in their functional status and demonstrates the ability to make significant improvements in function in a reasonable and predictable amount of time.     Precautions / Restrictions Precautions Precautions: Fall Restrictions Weight Bearing Restrictions: No      Mobility  Bed Mobility                    Transfers Overall transfer level: Needs assistance Equipment used: Rolling walker (2 wheels) Transfers: Sit to/from Stand, Bed to chair/wheelchair/BSC Sit to Stand: Min guard   Step pivot transfers: Min guard       General transfer comment: poor return for attempting sit to stand using [facility]  due to weakness, safer using RW    Ambulation/Gait Ambulation/Gait assistance: Supervision, Min guard Gait Distance (Feet): 45 Feet Assistive device: Rolling walker (2 wheels) Gait Pattern/deviations: Decreased step length - right, Decreased step length - left, Decreased stride length, Knees buckling Gait velocity: decreased     General Gait Details: slow labored cadence with occasional buckling of knees  without loss of balance, limited mostly due to c/o fatigue  Stairs            Wheelchair Mobility    Modified Rankin (Stroke Patients Only)       Balance Overall balance assessment: Needs assistance Sitting-balance support: Feet supported, No upper extremity supported Sitting balance-Leahy Scale: Good Sitting balance - Comments: seated EOB   Standing balance support: During functional activity, Bilateral upper extremity supported Standing balance-Leahy Scale: Fair Standing balance comment: using RW                             Pertinent Vitals/Pain Pain Assessment Pain Assessment: Faces Faces Pain Scale: Hurts little more Pain Location: L shoulder Pain Descriptors / Indicators: Grimacing, Guarding Pain Intervention(s): Limited activity within patient's tolerance, Monitored during session, Repositioned    Home Living Family/patient expects to be discharged to:: Assisted living Living Arrangements: Other (Comment) (assisted living)               Home Equipment: Cane - quad;Cane - single Barista (2 wheels)      Prior Function Prior Level of Function : Needs assist       Physical Assist : Mobility (physical) Mobility (physical): Bed mobility;Transfers;Gait;Stairs   Mobility Comments: Short distance community ambulation at times with quad-cane. Mostly household ambulator with quad-cane. ADLs Comments: PRN assist for bathing and dressing. Assited for IADL's.     Hand Dominance   Dominant Hand: Right    Extremity/Trunk Assessment   Upper Extremity Assessment Upper Extremity Assessment: Defer to OT evaluation RUE Deficits / Details: Near [facility] A/ROM of shoulder. LUE Deficits / Details: Limited to ~50% of availalbe shoulder flexion and abdction A/ROM. Pt reports this as a baseline issue he has had for several years. Same was stated for L UE tingling. LUE Sensation:  (Reports L UE tingling) LUE Coordination: decreased fine motor     Lower Extremity Assessment Lower Extremity Assessment: Generalized weakness    Cervical / Trunk Assessment Cervical / Trunk Assessment: Kyphotic  Communication   Communication: Expressive difficulties;Other (comment)  Cognition Arousal/Alertness: Awake/alert Behavior During Therapy: WFL for tasks assessed/performed Overall Cognitive Status: Within Functional Limits for tasks assessed                                          General Comments      Exercises     Assessment/Plan    PT Assessment Patient needs continued PT services  PT Problem List Decreased strength;Decreased activity tolerance;Decreased balance;Decreased mobility       PT Treatment Interventions DME instruction;Gait training;Stair training;Functional mobility training;Therapeutic exercise;Therapeutic activities;Balance training;Patient/family education    PT Goals (Current goals can be found in the Care Plan section)  Acute Rehab PT Goals Patient Stated Goal: return home with assisted living staff to assist PT Goal Formulation: With patient Time For Goal Achievement: 12/13/22 Potential to Achieve Goals: Good    Frequency Min 3X/week     Co-evaluation PT/OT/SLP Co-Evaluation/Treatment: Yes Reason for Co-Treatment:  To address functional/ADL transfers PT goals addressed during session: Mobility/safety with mobility;Balance;Proper use of DME OT goals addressed during session: ADL's and self-care       AM-PAC PT "6 Clicks" Mobility  Outcome Measure Help needed turning from your back to your side while in a flat bed without using bedrails?: None Help needed moving from lying on your back to sitting on the side of a flat bed without using bedrails?: None Help needed moving to and from a bed to a chair (including a wheelchair)?: A Little Help needed standing up from a chair using your arms (e.g., wheelchair or bedside chair)?: A Little Help needed to walk in hospital room?: A  Little Help needed climbing 3-5 steps with a railing? : A Lot 6 Click Score: 19    End of Session   Activity Tolerance: Patient tolerated treatment well;Patient limited by fatigue Patient left: in chair;with call bell/phone within reach Nurse Communication: Mobility status PT Visit Diagnosis: Unsteadiness on feet (R26.81);Other abnormalities of gait and mobility (R26.89);Muscle weakness (generalized) (M62.81)    Time: [id number] PT Time Calculation (min) (ACUTE ONLY): 21 min   Charges:   PT Evaluation $PT Eval Moderate Complexity: 1 Mod PT Treatments $Therapeutic Activity: 8-22 mins        11:21 AM, 12/10/22 Lonell Grandchild, MPT Physical Therapist with [facility] [phone] office [phone] mobile phone

## 2022-12-10 NOTE — Progress Notes (Signed)
[facility] Progress Note Patient: Daniel Li [id number] DOB: 06-24-1956 DOA: 12/09/2022  DOS: the patient was seen and examined on 12/10/2022  Brief hospital course: Daniel Li is a 67 y.o. male with PMH significant of CAD, chronic combined CHF, AICD implant, HTN, depression, GERD, mitral regurgitation, CVA. Patient presented to the hospital with complaints of left-sided chest pain.  Also vertigo with left-sided weakness. Assessment and Plan: Dizziness. 2 CT scans negative. PT OT evaluated the patient recommend home with home health. Patient is supposed to be on Plavix but he is not taking this medication. Will resume aspirin and Plavix. Echocardiogram shows unchanged EF. Unchanged MR Monitor.  Abnormal echocardiogram. Echocardiogram does have some apical findings without any thrombus. Will discuss with cardiology and follow recommendation.  Left-sided chest pain. Troponin multiple times have been inconsistent with ACS. EKG unremarkable. Musculoskeletal no clearly reproducible. Monitor blood  History of CAD History of chronic combined CHF. ICD implant. Severe mitral regurgitation. While the patient reports that the symptoms of chest pain have been progressively worsening limiting his daily ability does not appear to be clinically volume overloaded right now. Outpatient follow-up with cardiology.   Diabetes mellitus type 2 with complications (HCC) Sliding scale insulin. Holding oral hypoglycemic agent.   Benign essential HTN Blood pressure stable. Continue current regimen.   AKI (acute kidney injury) ([place]) Baseline serum creatinine 0.7. Currently serum creatinine 1.3. Would monitor. Would not hydrate.   Elevated troponin No indication for further monitoring. Chronically elevated. Continue aspirin and Plavix.   Gastroesophageal reflux disease Continue PPI.   Cocaine positive UDS positive at [place] explains his chest  pain. Possibility of an acute CVA cannot be ruled out. Although patient clearly denies abusing any cocaine.     Subjective: Continues to have chest pain.  Continues to have vertigo.  No nausea vomiting fever no chills.  Able to ambulate 250 feet.  Physical Exam: General: in Mild distress, No Rash Cardiovascular: S1 and S2 Present, No Murmur Respiratory: Good respiratory effort, Bilateral Air entry present. No Crackles, No wheezes Abdomen: Bowel Sound present, No tenderness Extremities: No edema Neuro: Alert and oriented x3, no new focal deficit, still has left-sided weakness although mostly from poor effort rather than any organic abnormality.  Data Reviewed: I have Reviewed nursing notes, Vitals, and Lab results. Since last encounter, pertinent lab results CBC and BMP and echocardiogram   .   Disposition: Status is: Observation  heparin injection 5,000 Units Start: 12/09/22 1800   Family Communication: No one at bedside Level of care: Telemetry continue telemetry for now. Vitals:   12/09/22 2059 12/09/22 2313 12/10/22 1143 12/10/22 1655  BP: (!) 86/56 99/72 97/61 $ 95/67  Pulse: (!) 58 (!) 54 (!) 55 (!) 52  Resp: 16 16 18 20  $ Temp: 98.3 F (36.8 C) 97.8 F (36.6 C) 98.2 F (36.8 C) 98.4 F (36.9 C)  TempSrc: Oral Oral  Oral  SpO2: 96% 95% 95% 100%  Weight:      Height:         Author: Berle Mull, MD 12/10/2022 5:52 PM  Please look on [email or web address] to find out who is on call.

## 2022-12-10 NOTE — Plan of Care (Signed)
  Problem: Acute Rehab PT Goals(only PT should resolve) Goal: Pt Will Go Supine/Side To Sit Outcome: Progressing Flowsheets (Taken 12/10/2022 1127) Pt will go Supine/Side to Sit:  Independently  with modified independence Goal: Patient Will Transfer Sit To/From Stand Outcome: Progressing Flowsheets (Taken 12/10/2022 1127) Patient will transfer sit to/from stand:  with supervision  with modified independence Goal: Pt Will Transfer Bed To Chair/Chair To Bed Outcome: Progressing Flowsheets (Taken 12/10/2022 1127) Pt will Transfer Bed to Chair/Chair to Bed:  with modified independence  with supervision Goal: Pt Will Ambulate Outcome: Progressing Flowsheets (Taken 12/10/2022 1127) Pt will Ambulate:  75 feet  with supervision  with modified independence  with rolling walker  with least restrictive assistive device   11:28 AM, 12/10/22 Lonell Grandchild, MPT Physical Therapist with [facility] [phone] office [phone] mobile phone

## 2022-12-11 ENCOUNTER — Ambulatory Visit: Payer: Medicare Other | Attending: Cardiovascular Disease

## 2022-12-11 DIAGNOSIS — R42 Dizziness and giddiness: Secondary | ICD-10-CM | POA: Diagnosis not present

## 2022-12-11 DIAGNOSIS — I5022 Chronic systolic (congestive) heart failure: Secondary | ICD-10-CM

## 2022-12-11 DIAGNOSIS — Z95810 Presence of automatic (implantable) cardiac defibrillator: Secondary | ICD-10-CM

## 2022-12-11 LAB — GLUCOSE, CAPILLARY
Glucose-Capillary: 112 mg/dL — ABNORMAL HIGH (ref 70–99)
Glucose-Capillary: 126 mg/dL — ABNORMAL HIGH (ref 70–99)

## 2022-12-11 NOTE — NC FL2 (Signed)
[facility] LEVEL OF CARE FORM     IDENTIFICATION  Patient Name: Daniel Li Birthdate: 1956/02/22 Sex: male Admission Date (Current Location): 12/09/2022  [facility] and [place] Number:  [employer] and Address:  [facility] [address] [place]      Provider Number: [id number]  Attending Physician Name and Address:  Lavina Hamman, MD  Relative Name and Phone Number:       Current Level of Care: Hospital Recommended Level of Care: [place] Prior Approval Number:    Date Approved/Denied:   PASRR Number:    Discharge Plan: Other (Comment) ([place])    Current Diagnoses: Patient Active Problem List   Diagnosis Date Noted   Dizziness 12/09/2022   Cocaine positive 12/09/2022   Mitral regurgitation 03/22/2022   CAD in native artery 08/23/2020   Unstable angina ([place]) 08/22/2020   Status post coronary artery stent placement    Hyponatremia    Gastroesophageal reflux disease    Dehydration with hyponatremia    Acute on chronic renal failure ([place]) 05/25/2018   Abdominal pain, acute, epigastric 05/17/2018   Hernia of abdominal wall 05/17/2018   Presumed Gastritis 05/17/2018   AKI (acute kidney injury) ([place]) 05/16/2018   Generalized abdominal pain    Elevated troponin    Chest pain 04/29/2018   ICD (implantable cardioverter-defibrillator) in place 03/05/2018   Anticoagulated on Coumadin 03/05/2018   Non-STEMI (non-ST elevated myocardial infarction) ([place]) 02/12/2018   Precordial chest pain [id number]   Chronic systolic CHF (congestive heart failure) ([place]) 02/11/2018   Chronic combined systolic (congestive) and diastolic (congestive) heart failure (HCC)    STEMI (ST elevation myocardial infarction) ([place]) 02/21/2017   Ischemic cardiomyopathy 07/20/2014   High risk medication use 01/28/2013   Special screening for malignant neoplasms, colon 01/28/2013   Hypercholesterolemia     Tobacco abuse    Diabetes mellitus type 2 with complications ([place]) [id number]   Benign essential HTN 03/26/2012   CAD S/P percutaneous coronary angioplasty 03/26/2012    Orientation RESPIRATION BLADDER Height & Weight     Self, Time, Situation, Place  Normal Continent Weight: 146 lb (66.2 kg) Height:  5' 6"$  (167.6 cm)  BEHAVIORAL SYMPTOMS/MOOD NEUROLOGICAL BOWEL NUTRITION STATUS      Continent Diet (no added table salt)  AMBULATORY STATUS COMMUNICATION OF NEEDS Skin   Supervision Verbally Normal                       Personal Care Assistance Level of Assistance  Bathing, Feeding, Dressing Bathing Assistance: Limited assistance Feeding assistance: Independent Dressing Assistance: Limited assistance     Functional Limitations Info  Sight, Hearing, Speech Sight Info: Adequate Hearing Info: Adequate Speech Info: Adequate    SPECIAL CARE FACTORS FREQUENCY                       Contractures Contractures Info: Not present    Additional Factors Info  Code Status, Allergies Code Status Info: FULL Allergies Info: Contrast Media (iodinated Contrast Media) and pork derived products           Current Medications (12/11/2022):  This is the current hospital active medication list Current Facility-Administered Medications  Medication Dose Route Frequency Provider Last Rate Last Admin   acetaminophen (TYLENOL) tablet 650 mg  650 mg Oral Q4H PRN Lavina Hamman, MD   650 mg at 12/11/22 [id number]   Or   acetaminophen (TYLENOL) 160  MG/5ML solution 650 mg  650 mg Per Tube Q4H PRN Lavina Hamman, MD       Or   acetaminophen (TYLENOL) suppository 650 mg  650 mg Rectal Q4H PRN Lavina Hamman, MD       amiodarone (PACERONE) tablet 200 mg  200 mg Oral Daily Lavina Hamman, MD   200 mg at 12/11/22 [id number]   aspirin EC tablet 81 mg  81 mg Oral Daily Lavina Hamman, MD   81 mg at 12/11/22 0950   clopidogrel (PLAVIX) tablet 75 mg  75 mg Oral Daily Lavina Hamman, MD   75 mg at  12/11/22 0945   cyclobenzaprine (FLEXERIL) tablet 10 mg  10 mg Oral TID Lavina Hamman, MD   10 mg at 12/11/22 0948   digoxin (LANOXIN) tablet 0.125 mg  0.125 mg Oral Daily Lavina Hamman, MD   0.125 mg at 12/10/22 0859   empagliflozin (JARDIANCE) tablet 10 mg  10 mg Oral Daily Lavina Hamman, MD   10 mg at 12/11/22 0947   ezetimibe (ZETIA) tablet 10 mg  10 mg Oral Daily Lavina Hamman, MD   10 mg at 12/11/22 0951   gabapentin (NEURONTIN) capsule 200 mg  200 mg Oral TID Lavina Hamman, MD   200 mg at 12/11/22 0951   heparin injection 5,000 Units  5,000 Units Subcutaneous Q8H Lavina Hamman, MD   5,000 Units at 12/11/22 0513   insulin aspart (novoLOG) injection 0-5 Units  0-5 Units Subcutaneous QHS Lavina Hamman, MD       insulin aspart (novoLOG) injection 0-9 Units  0-9 Units Subcutaneous TID WC Lavina Hamman, MD       lidocaine (LIDODERM) 5 % 2 patch  2 patch Transdermal Q24H Lavina Hamman, MD   2 patch at 12/10/22 1642   meclizine (ANTIVERT) tablet 25 mg  25 mg Oral TID Lavina Hamman, MD   25 mg at 12/11/22 0946   oxyCODONE (Oxy IR/ROXICODONE) immediate release tablet 5 mg  5 mg Oral Q4H PRN Lavina Hamman, MD   5 mg at 12/11/22 0513   pantoprazole (PROTONIX) EC tablet 40 mg  40 mg Oral Daily Lavina Hamman, MD   40 mg at 12/11/22 0950     Discharge Medications: acetaminophen 325 MG tablet Commonly known as: TYLENOL Take 650 mg by mouth every 6 (six) hours as needed for mild pain, fever or headache.    amiodarone 200 MG tablet Commonly known as: PACERONE TAKE (1) TABLET BY MOUTH ONCE DAILY. What changed: See the new instructions.    aspirin EC 81 MG tablet Take 1 tablet (81 mg total) by mouth daily. Swallow whole.    CeraVe SA Rough & Bumpy Skin Lotn Apply 1 application. topically 2 (two) times daily. Apply to both feet    clopidogrel 75 MG tablet Commonly known as: PLAVIX Take 75 mg by mouth daily.    cyclobenzaprine 10 MG tablet Commonly known as:  FLEXERIL Take 1 tablet (10 mg total) by mouth 3 (three) times daily.    diclofenac Sodium 1 % Gel Commonly known as: VOLTAREN Apply 2 g topically 4 (four) times daily as needed (pain).    digoxin 0.125 MG tablet Commonly known as: LANOXIN TAKE (1) TABLET BY MOUTH ONCE DAILY. What changed: See the new instructions.    empagliflozin 10 MG Tabs tablet Commonly known as: JARDIANCE Take 10 mg by mouth daily.    ezetimibe 10 MG tablet  Commonly known as: Zetia Take 1 tablet (10 mg total) by mouth daily.    gabapentin 100 MG capsule Commonly known as: NEURONTIN Take 100 mg by mouth 2 (two) times daily.    meclizine 25 MG tablet Commonly known as: ANTIVERT Take 1 tablet (25 mg total) by mouth 3 (three) times daily.    metFORMIN 500 MG tablet Commonly known as: GLUCOPHAGE Take 500 mg by mouth daily with breakfast.    nitroGLYCERIN 0.4 MG SL tablet Commonly known as: NITROSTAT PLACE ONE (1) TABLET UNDER TONGUE EVERY 5 MINUTES UP TO (3) DOSES AS NEEDED FOR CHEST PAIN. IF NO RELIEF, CONTACT MD. What changed: See the new instructions.    pantoprazole 40 MG tablet Commonly known as: PROTONIX Take 40 mg by mouth daily.    potassium chloride 10 MEQ tablet Commonly known as: KLOR-CON TAKE (1) TABLET BY MOUTH ONCE EVERY OTHER DAY. What changed: See the new instructions.    Repatha SureClick [id number] MG/ML [place] Generic drug: Evolocumab INJECT CONTENTS OF ONE PREFILLED PEN SUBCUTANEOUSLY INTO THE SKIN EVERY 14 DAYS. What changed: See the new instructions.    spironolactone 25 MG tablet Commonly known as: ALDACTONE TAKE (1/2) TABLET (12.5MG) BY MOUTH DAILY. What changed: See the new instructions.    torsemide 20 MG tablet Commonly known as: DEMADEX Take 1 tablet (20 mg total) by mouth daily as needed (for swelling or 3 pound weight gain).    Relevant Imaging Results:  Relevant Lab Results:   Additional Information SSN: [id number] [place]

## 2022-12-11 NOTE — TOC Transition Note (Signed)
Transition of Care [facility]) - CM/SW Discharge Note   Patient Details  Name: Daniel Li MRN: [id number] Date of Birth: 06-Jan-1956  [facility]) CM/SW Contact:  Shade Flood, LCSW Phone Number: 12/11/2022, 11:27 AM   Clinical Narrative:     Pt medically stable for dc per MD. Updated staff at pt's ALF and they can accept pt back today.   DC clinical sent electronically. Transport arranged with [facility]. RN aware.  There are no other TOC needs for dc.  Final next level of care: Home w Home Health Services Barriers to Discharge: Barriers Resolved   Patient Goals and CMS Choice CMS Medicare.gov Compare Post Acute Care list provided to:: Patient Choice offered to / list presented to : Patient  Discharge Placement                         Discharge Plan and Services Additional resources added to the After Visit Summary for   In-house Referral: Clinical Social Work Discharge Planning Services: CM Consult Post Acute Care Choice: Home Health                    HH Arranged: PT, OT [facility] Agency: [place] Date [place]: 12/10/22   Representative spoke with at [place]: [name]  Social Determinants of Health ([place]) Interventions SDOH Screenings   Food Insecurity: No Food Insecurity (12/10/2022)  Housing: Low Risk  (12/10/2022)  Transportation Needs: No Transportation Needs (12/10/2022)  Utilities: Not At Risk (12/10/2022)  Financial Resource Strain: Medium Risk (10/01/2018)  Tobacco Use: High Risk (12/10/2022)     Readmission Risk Interventions     No data to display

## 2022-12-11 NOTE — Progress Notes (Signed)
OT Cancellation Note  Patient Details Name: Daniel Li MRN: [id number] DOB: [birth year]   Cancelled Treatment:    Reason Eval/Treat Not Completed: Patient declined due to feeling dizzy. Pt reported his head was spinning and that he did not want to get out of bed. Will attempt to see pt later as time permits.   Pharoah Goggins OT, MOT   Larey Seat 12/11/2022, 9:11 AM

## 2022-12-11 NOTE — Plan of Care (Signed)
Problem: Education: Goal: Knowledge of General Education information will improve Description: Including pain rating scale, medication(s)/side effects and non-pharmacologic comfort measures Outcome: Adequate for Discharge   Problem: Health Behavior/Discharge Planning: Goal: Ability to manage health-related needs will improve Outcome: Adequate for Discharge   Problem: Clinical Measurements: Goal: Ability to maintain clinical measurements within normal limits will improve Outcome: Adequate for Discharge Goal: Will remain free from infection Outcome: Adequate for Discharge Goal: Diagnostic test results will improve Outcome: Adequate for Discharge Goal: Respiratory complications will improve Outcome: Adequate for Discharge Goal: Cardiovascular complication will be avoided Outcome: Adequate for Discharge   Problem: Activity: Goal: Risk for activity intolerance will decrease Outcome: Adequate for Discharge   Problem: Nutrition: Goal: Adequate nutrition will be maintained Outcome: Adequate for Discharge   Problem: Coping: Goal: Level of anxiety will decrease Outcome: Adequate for Discharge   Problem: Elimination: Goal: Will not experience complications related to bowel motility Outcome: Adequate for Discharge Goal: Will not experience complications related to urinary retention Outcome: Adequate for Discharge   Problem: Pain Managment: Goal: General experience of comfort will improve Outcome: Adequate for Discharge   Problem: Safety: Goal: Ability to remain free from injury will improve Outcome: Adequate for Discharge   Problem: Skin Integrity: Goal: Risk for impaired skin integrity will decrease Outcome: Adequate for Discharge   Problem: Education: Goal: Ability to describe self-care measures that may prevent or decrease complications (Diabetes Survival Skills Education) will improve Outcome: Adequate for Discharge Goal: Individualized Educational Video(s) Outcome:  Adequate for Discharge   Problem: Coping: Goal: Ability to adjust to condition or change in health will improve Outcome: Adequate for Discharge   Problem: Fluid Volume: Goal: Ability to maintain a balanced intake and output will improve Outcome: Adequate for Discharge   Problem: Health Behavior/Discharge Planning: Goal: Ability to identify and utilize available resources and services will improve Outcome: Adequate for Discharge Goal: Ability to manage health-related needs will improve Outcome: Adequate for Discharge   Problem: Metabolic: Goal: Ability to maintain appropriate glucose levels will improve Outcome: Adequate for Discharge   Problem: Nutritional: Goal: Maintenance of adequate nutrition will improve Outcome: Adequate for Discharge Goal: Progress toward achieving an optimal weight will improve Outcome: Adequate for Discharge   Problem: Skin Integrity: Goal: Risk for impaired skin integrity will decrease Outcome: Adequate for Discharge   Problem: Tissue Perfusion: Goal: Adequacy of tissue perfusion will improve Outcome: Adequate for Discharge   Problem: Education: Goal: Knowledge of disease or condition will improve Outcome: Adequate for Discharge Goal: Knowledge of secondary prevention will improve (MUST DOCUMENT ALL) Outcome: Adequate for Discharge Goal: Knowledge of patient specific risk factors will improve Elta [place] N/A or DELETE if not current risk factor) Outcome: Adequate for Discharge   Problem: Ischemic Stroke/TIA Tissue Perfusion: Goal: Complications of ischemic stroke/TIA will be minimized Outcome: Adequate for Discharge   Problem: Coping: Goal: Will verbalize positive feelings about self Outcome: Adequate for Discharge Goal: Will identify appropriate support needs Outcome: Adequate for Discharge   Problem: Health Behavior/Discharge Planning: Goal: Ability to manage health-related needs will improve Outcome: Adequate for Discharge Goal: Goals  will be collaboratively established with patient/family Outcome: Adequate for Discharge   Problem: Self-Care: Goal: Ability to participate in self-care as condition permits will improve Outcome: Adequate for Discharge Goal: Verbalization of feelings and concerns over difficulty with self-care will improve Outcome: Adequate for Discharge Goal: Ability to communicate needs accurately will improve Outcome: Adequate for Discharge   Problem: Nutrition: Goal: Risk of aspiration will decrease Outcome: Adequate for Discharge  Goal: Dietary intake will improve Outcome: Adequate for Discharge

## 2022-12-11 NOTE — Discharge Summary (Signed)
Physician Discharge Summary   Patient: Daniel Li MRN: [id number] DOB: 31-May-1956  Admit date:     12/09/2022  Discharge date: 12/11/22  Discharge Physician: Berle Mull  PCP: Glenda Chroman, MD  Recommendations at discharge: Follow up with PCP in 1 week    Follow-up Information     Vyas, Dhruv B, MD. Schedule an appointment as soon as possible for a visit in 1 week(s).   Specialty: Internal Medicine Contact information: [place] [address] [phone]                Discharge Diagnoses: Principal Problem:   Dizziness Active Problems:   Diabetes mellitus type 2 with complications ([place])   Benign essential HTN   CAD S/P percutaneous coronary angioplasty   Chronic combined systolic (congestive) and diastolic (congestive) heart failure (HCC)   ICD (implantable cardioverter-defibrillator) in place   Chest pain   AKI (acute kidney injury) ([place])   Elevated troponin   Gastroesophageal reflux disease   Mitral regurgitation   Cocaine positive  Hospital Course: Daniel Li is a 67 y.o. male with PMH significant of CAD, chronic combined CHF, AICD implant, HTN, depression, GERD, mitral regurgitation, CVA. Patient presented to the hospital with complaints of left-sided chest pain.  Also vertigo with left-sided weakness. Assessment and Plan Dizziness. 2 Head CT scans negative any acute stroke. PT OT evaluated the patient recommend home with home health. Patient is supposed to be on Plavix but he is not taking this medication. Will resume aspirin and Plavix. Echocardiogram shows unchanged EF. Unchanged MR Monitor.   Abnormal echocardiogram. Echocardiogram does have some apical findings without any thrombus. D/w Cardiology. Less likely thrombus. Pt will close outpt follow up with Cardiology   Left-sided chest pain. Musckuloskeletal.  ACD ruled out.  Troponin multiple times have been inconsistent with ACS. EKG unremarkable. Clearly  reproducible with movement and palpation Topical treatment and muscle relaxant.   Cocaine positive UDS positive at [facility] and here.  Likely explains his chest pain. Although patient clearly denies abusing any cocaine.   Recommended pt to refrain from using cocaine   History of CAD History of chronic combined CHF. ICD implant. Severe mitral regurgitation. While the patient reports that the symptoms of chest pain have been progressively worsening limiting his daily ability does not appear to be clinically volume overloaded right now. Outpatient follow-up with cardiology.   Diabetes mellitus type 2 with complications ([place]) Resume home regimen    Benign essential HTN Blood pressure stable. Continue current regimen.   CKD stage IIIA NO AKI  Currently serum creatinine 1.3. close to his baseline in our system.    Elevated troponin No indication for further monitoring. Chronically elevated and stable and not in ACS territory. Continue aspirin and Plavix.   Gastroesophageal reflux disease Continue PPI.  Consultants:  none  Procedures performed:  Echocardiogram   DISCHARGE MEDICATION: Allergies as of 12/11/2022       Reactions   Contrast Media [iodinated Contrast Media] Other (See Comments)   Does not eat for religious reasons - okay with using IV heparin   Pork-derived Products Other (See Comments)   Does not eat for religious reasons - okay with using IV heparin        Medication List     STOP taking these medications    fenofibrate 145 MG tablet Commonly known as: TRICOR       TAKE these medications    acetaminophen 325 MG tablet Commonly known as: TYLENOL  Take 650 mg by mouth every 6 (six) hours as needed for mild pain, fever or headache.   amiodarone 200 MG tablet Commonly known as: PACERONE TAKE (1) TABLET BY MOUTH ONCE DAILY. What changed: See the new instructions.   aspirin EC 81 MG tablet Take 1 tablet (81 mg total) by mouth daily.  Swallow whole.   CeraVe SA Rough & Bumpy Skin Lotn Apply 1 application. topically 2 (two) times daily. Apply to both feet   clopidogrel 75 MG tablet Commonly known as: PLAVIX Take 75 mg by mouth daily.   cyclobenzaprine 10 MG tablet Commonly known as: FLEXERIL Take 1 tablet (10 mg total) by mouth 3 (three) times daily.   diclofenac Sodium 1 % Gel Commonly known as: VOLTAREN Apply 2 g topically 4 (four) times daily as needed (pain).   digoxin 0.125 MG tablet Commonly known as: LANOXIN TAKE (1) TABLET BY MOUTH ONCE DAILY. What changed: See the new instructions.   empagliflozin 10 MG Tabs tablet Commonly known as: JARDIANCE Take 10 mg by mouth daily.   ezetimibe 10 MG tablet Commonly known as: Zetia Take 1 tablet (10 mg total) by mouth daily.   gabapentin 100 MG capsule Commonly known as: NEURONTIN Take 100 mg by mouth 2 (two) times daily.   meclizine 25 MG tablet Commonly known as: ANTIVERT Take 1 tablet (25 mg total) by mouth 3 (three) times daily.   metFORMIN 500 MG tablet Commonly known as: GLUCOPHAGE Take 500 mg by mouth daily with breakfast.   nitroGLYCERIN 0.4 MG SL tablet Commonly known as: NITROSTAT PLACE ONE (1) TABLET UNDER TONGUE EVERY 5 MINUTES UP TO (3) DOSES AS NEEDED FOR CHEST PAIN. IF NO RELIEF, CONTACT MD. What changed: See the new instructions.   pantoprazole 40 MG tablet Commonly known as: PROTONIX Take 40 mg by mouth daily.   potassium chloride 10 MEQ tablet Commonly known as: KLOR-CON TAKE (1) TABLET BY MOUTH ONCE EVERY OTHER DAY. What changed: See the new instructions.   Repatha SureClick [id number] MG/ML [place] Generic drug: Evolocumab INJECT CONTENTS OF ONE PREFILLED PEN SUBCUTANEOUSLY INTO THE SKIN EVERY 14 DAYS. What changed: See the new instructions.   spironolactone 25 MG tablet Commonly known as: ALDACTONE TAKE (1/2) TABLET (12.5MG) BY MOUTH DAILY. What changed: See the new instructions.   torsemide 20 MG tablet Commonly known as:  DEMADEX Take 1 tablet (20 mg total) by mouth daily as needed (for swelling or 3 pound weight gain).       Disposition: Home Diet recommendation: Cardiac diet  Discharge Exam: Vitals:   12/11/22 0427 12/11/22 0830 12/11/22 0924 12/11/22 0952  BP: 93/64 97/69 102/70   Pulse: (!) 56 (!) 57 60 (!) 58  Resp: 14 16    Temp: 98.2 F (36.8 C) 98.4 F (36.9 C)    TempSrc: Oral Oral    SpO2: 95% 100%    Weight:      Height:       General: Appear in mild distress; no visible Abnormal Neck Mass Or lumps, Conjunctiva normal Cardiovascular: S1 and S2 Present, no Murmur, reproducible chest pain on left present Respiratory: good respiratory effort, Bilateral Air entry present and CTA, no Crackles, no wheezes Abdomen: Bowel Sound present, Non tender  Extremities: no Pedal edema Neurology: alert and oriented to time, place, and person, left sided weakness present Filed Weights   12/09/22 1144  Weight: 66.2 kg   Condition at discharge: stable  The results of significant diagnostics from this hospitalization (including imaging, microbiology, ancillary  and laboratory) are listed below for reference.   Imaging Studies: ECHOCARDIOGRAM COMPLETE  Result Date: 12/10/2022    ECHOCARDIOGRAM REPORT   Patient Name:   Daniel Li Date of Exam: 12/10/2022 Medical Rec #:  [id number]           Height:       66.0 in Accession #:    [id number]          Weight:       146.0 lb Date of Birth:  05/29/1956           BSA:          1.749 m Patient Age:    58 years            BP:           99/72 mmHg Patient Gender: M                   HR:           54 bpm. Exam Location:  [facility] Procedure: 2D Echo, Cardiac Doppler and Color Doppler Indications:    G45.9 TIA  History:        Patient has prior history of Echocardiogram examinations, most                 recent 03/22/2022. CHF and Cardiomyopathy, CAD and Previous                 Myocardial Infarction, Defibrillator; Risk Factors:Hypertension,                  Diabetes and Dyslipidemia.  Sonographer:    Alvino Chapel RCS Referring Phys: [id number] [place]  1. Left ventricular ejection fraction, by estimation, is approximately 20%. The left ventricle has severely decreased function. The left ventricle demonstrates regional wall motion abnormalities (see scoring diagram/findings for description). The left ventricular internal cavity size was severely dilated. Left ventricular diastolic parameters are consistent with Grade I diastolic dysfunction (impaired relaxation).  2. Slow flow and stasis at LV apex without formed mural thrombus by Definity contrast.  3. Right ventricular systolic function is normal. The right ventricular size is normal. There is normal pulmonary artery systolic pressure. The estimated right ventricular systolic pressure is 0000000 mmHg.  4. Left atrial size was moderately dilated.  5. The mitral valve is degenerative. Loose chordal structure associated with anterior leaflet, both leaflets are restricted. At least moderate mitral valve regurgitation.  6. The aortic valve is tricuspid. Aortic valve regurgitation is not visualized.  7. The inferior vena cava is normal in size with greater than 50% respiratory variability, suggesting right atrial pressure of 3 mmHg. Comparison(s): Prior images reviewed side by side. LVEF relatively stable at 20%. At least moderate mitral regurgitation noted. FINDINGS  Left Ventricle: Left ventricular ejection fraction, by estimation, is 20%. The left ventricle has severely decreased function. The left ventricle demonstrates regional wall motion abnormalities. Definity contrast agent was given IV to delineate the left  ventricular endocardial borders. The left ventricular internal cavity size was severely dilated. There is borderline left ventricular hypertrophy. Left ventricular diastolic parameters are consistent with Grade I diastolic dysfunction (impaired relaxation).  LV Wall Scoring: The entire anterior  septum, apical anterior segment, apical inferior segment, and apex are akinetic. The anterior wall, entire lateral wall, inferior wall, mid inferoseptal segment, and basal inferoseptal segment are hypokinetic. Right Ventricle: The right ventricular size is normal. No increase in right ventricular wall thickness. Right ventricular systolic function  is normal. There is normal pulmonary artery systolic pressure. The tricuspid regurgitant velocity is 2.06 m/s, and  with an assumed right atrial pressure of 3 mmHg, the estimated right ventricular systolic pressure is 0000000 mmHg. Left Atrium: Left atrial size was moderately dilated. Right Atrium: Right atrial size was normal in size. Pericardium: There is no evidence of pericardial effusion. Mitral Valve: The mitral valve is degenerative in appearance. There is mild thickening of the mitral valve leaflet(s). Moderate mitral valve regurgitation. Tricuspid Valve: The tricuspid valve is grossly normal. Tricuspid valve regurgitation is trivial. Aortic Valve: The aortic valve is tricuspid. There is mild aortic valve annular calcification. Aortic valve regurgitation is not visualized. Pulmonic Valve: The pulmonic valve was grossly normal. Pulmonic valve regurgitation is trivial. Aorta: The aortic root is normal in size and structure. Venous: The inferior vena cava is normal in size with greater than 50% respiratory variability, suggesting right atrial pressure of 3 mmHg. IAS/Shunts: No atrial level shunt detected by color flow Doppler. Additional Comments: A device lead is visualized.  LEFT VENTRICLE PLAX 2D LVIDd:         6.90 cm      Diastology LVIDs:         6.40 cm      LV e' medial:    5.11 cm/s LV PW:         1.00 cm      LV E/e' medial:  9.3 LV IVS:        0.90 cm      LV e' lateral:   3.37 cm/s LVOT diam:     2.10 cm      LV E/e' lateral: 14.1 LV SV:         32 LV SV Index:   18 LVOT Area:     3.46 cm  LV Volumes (MOD) LV vol d, MOD A2C: 149.0 ml LV vol d, MOD A4C: 261.0  ml LV vol s, MOD A2C: 136.0 ml LV vol s, MOD A4C: 207.0 ml LV SV MOD A2C:     13.0 ml LV SV MOD A4C:     261.0 ml LV SV MOD BP:      43.3 ml RIGHT VENTRICLE RV S prime:     9.25 cm/s TAPSE (M-mode): 1.6 cm LEFT ATRIUM             Index        RIGHT ATRIUM           Index LA diam:        4.40 cm 2.52 cm/m   RA Area:     15.70 cm LA Vol (A2C):   84.8 ml 48.47 ml/m  RA Volume:   40.10 ml  22.92 ml/m LA Vol (A4C):   84.2 ml 48.13 ml/m LA Biplane Vol: 87.4 ml 49.96 ml/m  AORTIC VALVE LVOT Vmax:   57.90 cm/s LVOT Vmean:  42.800 cm/s LVOT VTI:    0.091 m  AORTA Ao Root diam: 3.70 cm MITRAL VALVE               TRICUSPID VALVE MV Area (PHT): 3.99 cm    TR Peak grad:   17.0 mmHg MV Decel Time: 190 msec    TR Vmax:        206.00 cm/s MV E velocity: 47.60 cm/s MV A velocity: 98.60 cm/s  SHUNTS MV E/A ratio:  0.48        Systemic VTI:  0.09 m  Systemic Diam: 2.10 cm Rozann Lesches MD Electronically signed by Rozann Lesches MD Signature Date/Time: 12/10/2022/3:31:25 PM    Final    CT HEAD WO CONTRAST (5MM)  Result Date: 12/10/2022 CLINICAL DATA:  Neuro deficit, acute, stroke suspected. Altered mental status. EXAM: CT HEAD WITHOUT CONTRAST TECHNIQUE: Contiguous axial images were obtained from the base of the skull through the vertex without intravenous contrast. RADIATION DOSE REDUCTION: This exam was performed according to the departmental dose-optimization program which includes automated exposure control, adjustment of the mA and/or kV according to patient size and/or use of iterative reconstruction technique. COMPARISON:  Head CT 12/09/2022 FINDINGS: Brain: There is no evidence of an acute infarct, intracranial hemorrhage, mass, midline shift, or extra-axial fluid collection. Again noted are a moderately large chronic right MCA infarct centered in the anterior parietal lobe and a small chronic posterior left MCA infarct. Patchy hypodensities elsewhere in the cerebral white matter  bilaterally are unchanged and nonspecific but compatible with mild-to-moderate chronic small vessel ischemic disease. The ventricles are normal in size. Vascular: Calcified atherosclerosis at the skull base. No hyperdense vessel. Skull: No acute fracture or suspicious osseous lesion. Sinuses/Orbits: Visualized paranasal sinuses and mastoid air cells are clear. Visualized portions of the orbits are unremarkable. Other: None. IMPRESSION: 1. No evidence of acute intracranial abnormality. 2. Chronic bilateral MCA infarcts. Electronically Signed   By: Logan Bores M.D.   On: 12/10/2022 10:41   CT Head Wo Contrast  Result Date: 12/09/2022 CLINICAL DATA:  Chest pain and dizziness for 4 days. Neurologic deficit. EXAM: CT HEAD WITHOUT CONTRAST TECHNIQUE: Contiguous axial images were obtained from the base of the skull through the vertex without intravenous contrast. RADIATION DOSE REDUCTION: This exam was performed according to the departmental dose-optimization program which includes automated exposure control, adjustment of the mA and/or kV according to patient size and/or use of iterative reconstruction technique. COMPARISON:  05/24/2018. FINDINGS: Brain: Ventricles are normal in size and configuration. Old right posterior frontal infarct. Old left parietal infarct. Patchy areas of white matter hypoattenuation noted consistent with chronic microvascular ischemic change. No parenchymal or extra-axial masses.  No mass effect. No intracranial hemorrhage. Vascular: No hyperdense vessel or unexpected calcification. Skull: Normal. Negative for fracture or focal lesion. Sinuses/Orbits: Globes and orbits are unremarkable. Visualized sinuses are clear. Other: None. IMPRESSION: 1. No acute intracranial abnormalities. 2. Old bilateral MCA distribution infarcts. Electronically Signed   By: Lajean Manes M.D.   On: 12/09/2022 15:09   DG Chest Portable 1 View  Result Date: 12/09/2022 CLINICAL DATA:  Chest pain EXAM: PORTABLE  CHEST 1 VIEW COMPARISON:  12/06/2022, 11/07/2020 FINDINGS: Left-sided implanted cardiac device. Stable heart size. Slightly low lung volumes. Mild streaky right basilar opacity. No pleural effusion or pneumothorax. IMPRESSION: Slightly low lung volumes with mild streaky right basilar opacity, favor atelectasis. Electronically Signed   By: Davina Poke D.O.   On: 12/09/2022 12:32   CUP PACEART REMOTE DEVICE CHECK  Result Date: 11/14/2022 Scheduled remote reviewed. Normal device function.  Next remote 91 days. [place]   Microbiology: Results for orders placed or performed during the hospital encounter of 11/07/20  SARS CORONAVIRUS 2 (TAT 6-24 HRS) Nasopharyngeal Nasopharyngeal Swab     Status: Abnormal   Collection Time: 11/08/20  4:11 AM   Specimen: Nasopharyngeal Swab  Result Value Ref Range Status   SARS Coronavirus 2 POSITIVE (A) NEGATIVE Final    Comment: (NOTE) SARS-CoV-2 target nucleic acids are DETECTED.  The SARS-CoV-2 RNA is generally detectable in upper and lower respiratory  specimens during the acute phase of infection. Positive results are indicative of the presence of SARS-CoV-2 RNA. Clinical correlation with patient history and other diagnostic information is  necessary to determine patient infection status. Positive results do not rule out bacterial infection or co-infection with other viruses.  The expected result is Negative.  Fact Sheet for Patients: SugarRoll.be  Fact Sheet for Healthcare Providers: https://www.woods-mathews.com/  This test is not yet approved or cleared by the [place] FDA and  has been authorized for detection and/or diagnosis of SARS-CoV-2 by FDA under an Emergency Use Authorization (EUA). This EUA will remain  in effect (meaning this test can be used) for the duration of the COVID-19 declaration under Section 564(b)(1) of the Act, 21 U. S.C. section 360bbb-3(b)(1), unless the authorization is  terminated or revoked sooner.   Performed at [facility], [place] [address] [place], [place] [address]    Labs: CBC: Recent Labs  Lab 12/09/22 1226  WBC 5.9  HGB 14.4  HCT 44.5  MCV 78.9*  PLT [id number]   Basic Metabolic Panel: Recent Labs  Lab 12/09/22 1226  NA 139  K 4.3  CL 102  CO2 26  GLUCOSE 73  BUN 13  CREATININE 1.33*  CALCIUM 9.3   Liver Function Tests: Recent Labs  Lab 12/09/22 1226  AST 36  ALT 24  ALKPHOS 41  BILITOT 0.5  PROT 7.4  ALBUMIN 3.9   CBG: Recent Labs  Lab 12/10/22 0851 12/10/22 1232 12/10/22 1658 12/10/22 2133 12/11/22 0709  GLUCAP 92 97 115* 128* 112*    Discharge time spent: greater than 30 minutes.  Signed: Berle Mull, MD [facility]

## 2022-12-11 NOTE — Telephone Encounter (Signed)
Spoke with the patient's dental office, who said the patient is to be seen by an oral surgeon. They also said the patient will have to call around to find a surgeon who will accept his insurance (Medicaid).  Will speak with Dr. Ali Lowe about logistics.

## 2022-12-11 NOTE — Progress Notes (Signed)
Mobility Specialist Progress Note:    12/11/22 1115  Mobility  Activity Ambulated with assistance in hallway  Level of Assistance Minimal assist, patient does 75% or more  Assistive Device Front wheel walker  Distance Ambulated (ft) 500 ft  Activity Response Tolerated well  Mobility Referral Yes  $Mobility charge 1 Mobility   Pt was received asleep in bed, agreeable to mobility session. Tolerated well, asx throughout. C/o left arm pain. Returned pt to chair with all needs met.   Royetta Crochet Mobility Specialist Please contact via Solicitor or  Rehab office at [phone]

## 2022-12-13 NOTE — Progress Notes (Signed)
EPIC Encounter for ICM Monitoring  Patient Name: Daniel Li is a 67 y.o. male Date: 12/13/2022 Primary Care Physican: Glenda Chroman, MD Primary Cardiologist: [facility] Electrophysiologist: [facility] 08/10/2022 Office Weight: 140 lbs                                                  Transmission reviewed.   Hospitalized 2/11-2/13 for chest pain.    CorVue thoracic impedance suggesting normal fluid levels.      Prescribed dosage:  Torsemide 20 mg Take 1 tablet by mouth as needed for swelling or 3 pound weight gain Potassium 10 mEq take 1 tablet by mouth every other day Spironolactone 25 mg take 0.5 tablet (12.5 mg total) daily   Labs: 08/21/2022 Creatinine 1.62, BUN 14, Potassium 4.6, Sodium 140, GFR 47 08/10/2022 Creatinine 1.50, BUN 15, Potassium 4.3, Sodium 140, GFR 51 04/05/2022 Creatinine 1.46, BUN 12, Potassium 4.4, Sodium 142, GFR 53 03/23/2022 Creatinine 1.03, BUN 11, Potassium 4.1, Sodium 140, GFR >60 03/22/2022 Creatinine 1.11, BUN 9,   Potassium 4.4, Sodium 141, GFR >60 02/19/2022 Creatinine 1.00, BUN 11, Potassium 3.9, Sodium 141, GFR >60 12/29/2021 Creatinine 1.10, BUN 14, Potassium 3.4, Sodium 145 Care Everywhere 12/28/2021 Creatinine 0.96, BUN 10, Potassium 4.0, Sodium 145 Care Everywhere 12/26/2021 Creatinine 1.07, BUN 9,   Potassium 4.0, Sodium 145 Care Everywhere A complete set of results can be found in Results Review.   Recommendations:  No changes   Follow-up plan: ICM clinic phone appointment on 01/14/2023.    91 day device clinic remote transmission 02/11/2023.     EP/Cardiology Office Visits:     Next appt due 12/11/2022 (4 mo f/u) with HF clinic (no recall).  Recall 04/04/2023 with Ricke Hey, PA.     Copy of ICM check sent to Dr. Myles Gip.    3 month ICM trend: 12/11/2022.    12-14 Month ICM trend:     Rosalene Billings, RN 12/13/2022 2:58 PM

## 2022-12-13 NOTE — Telephone Encounter (Signed)
Spoke with Dr. Ali Lowe. Will arrange an appointment with the patient and his family with Dr. Ali Lowe.

## 2022-12-17 NOTE — Telephone Encounter (Signed)
Spoke with the patient and Daniel Li, his caregiver. They both report they think he has an appointment with the oral surgeon but are not sure. She will confirm with the facility's scheduler tomorrow and call back with a date.

## 2022-12-20 NOTE — Telephone Encounter (Signed)
Called the patient's facility. Confirmed with assistant there that the patient has a consult with an oral surgeon 2/27. Will follow-up after appointment.

## 2022-12-24 NOTE — Progress Notes (Signed)
Remote ICD transmission.   

## 2023-01-04 NOTE — Telephone Encounter (Signed)
Have tried to reach the patient several times for follow-up. The number on file now sounds like a fax machine.  Will continue to try to reach patient.

## 2023-01-07 ENCOUNTER — Other Ambulatory Visit (HOSPITAL_COMMUNITY): Payer: Self-pay

## 2023-01-07 MED ORDER — ASPIRIN 81 MG PO TBEC: 81.0000 mg | DELAYED_RELEASE_TABLET | Freq: Every day | ORAL | 3 refills | Status: DC

## 2023-01-09 ENCOUNTER — Other Ambulatory Visit (HOSPITAL_COMMUNITY): Payer: Self-pay

## 2023-01-14 ENCOUNTER — Ambulatory Visit: Payer: Medicare Other | Attending: Cardiovascular Disease

## 2023-01-28 DIAGNOSIS — M25561 Pain in right knee: Secondary | ICD-10-CM | POA: Diagnosis not present

## 2023-01-28 DIAGNOSIS — I10 Essential (primary) hypertension: Secondary | ICD-10-CM | POA: Diagnosis not present

## 2023-01-28 DIAGNOSIS — E1165 Type 2 diabetes mellitus with hyperglycemia: Secondary | ICD-10-CM | POA: Diagnosis not present

## 2023-01-28 DIAGNOSIS — I4891 Unspecified atrial fibrillation: Secondary | ICD-10-CM | POA: Diagnosis not present

## 2023-01-28 DIAGNOSIS — Z299 Encounter for prophylactic measures, unspecified: Secondary | ICD-10-CM | POA: Diagnosis not present

## 2023-01-31 ENCOUNTER — Other Ambulatory Visit (HOSPITAL_COMMUNITY): Payer: Self-pay | Admitting: Cardiology

## 2023-02-06 ENCOUNTER — Telehealth (HOSPITAL_COMMUNITY): Payer: Self-pay | Admitting: Licensed Clinical Social Worker

## 2023-02-06 NOTE — Telephone Encounter (Signed)
H&V Care Navigation CSW Progress Note  Clinical Social Worker  received call from Daniel Li staff at [facility]F  to assist with transportation to appointment on Friday.  Patient is participating in a Managed Medicaid Plan:  [place] from [facility]F called requesting transport for patient to appointment on Friday. Patient ambulates with a cane and she reports is able to get in and out of the car. CSW set up [employer] for roundtrip transportation to appointment. Waiver reviewed and attached below. [employer] driver will leave number for retrun trip when patient ready. Daniel Beech, LCSW, CCSW-MCS [phone]   SDOH Screenings   Food Insecurity: No Food Insecurity (12/10/2022)  Housing: Low Risk  (12/10/2022)  Transportation Needs: Unmet Transportation Needs (02/06/2023)  Utilities: Not At Risk (12/10/2022)  Financial Resource Strain: Medium Risk (10/01/2018)  Tobacco Use: High Risk (12/10/2022)   02/06/2023  Daniel Li DOB: [birth year] MRN: [id number]   RIDER WAIVER AND RELEASE OF LIABILITY  For the purposes of helping with transportation needs, Daniel Li partners with outside transportation providers (taxi companies, Daniel Li, Daniel Li.) to give Daniel Li patients or other approved people the choice of on-demand rides [employer]") to our buildings for non-emergency visits.  By using Daniel Li, I, the person signing this document, on behalf of myself and/or any legal minors (in my care using the Daniel Li), agree:  Daniel Li given to me are supplied by independent, outside transportation providers who do not work for, or have any affiliation with, Daniel Li. Daniel Li. Daniel Li has no control over the quality or safety of the rides I get using Daniel Li. Daniel Li has no control over whether any outside ride will happen on time or not. Daniel Li gives no guarantee on the reliability, quality, safety,  or availability on any rides, or that no mistakes will happen. I know and accept that traveling by vehicle (car, truck, SVU, Zenaida Niece, bus, taxi, etc.) has risks of serious injuries such as disability, being paralyzed, and death. I know and agree the risk of using Daniel Li is mine alone, and not Daniel Li. Transport Services are provided "as is" and as are available. The transportation providers are in charge for all inspections and care of the vehicles used to provide these rides. I agree not to take legal action against Daniel Li, its agents, employees, officers, directors, representatives, insurers, attorneys, assigns, successors, subsidiaries, and affiliates at any time for any reasons related directly or indirectly to using Daniel Li. I also agree not to take legal action against Daniel Li or its affiliates for any injury, death, or damage to property caused by or related to using Daniel Li. I have read this Waiver and Release of Liability, and I understand the terms used in it and their legal meaning. This Waiver is freely and voluntarily given with the understanding that my right (or any legal minors) to legal action against Daniel Li relating to Daniel Li is knowingly given up to use these services.   I attest that I read the Ride Waiver and Release of Liability to Daniel Li, gave Mr. Lasch the opportunity to ask questions and answered the questions asked (if any). I affirm that Daniel Li then provided consent for assistance with transportation.     Daniel Li

## 2023-02-08 ENCOUNTER — Ambulatory Visit (HOSPITAL_COMMUNITY)
Admission: RE | Admit: 2023-02-08 | Discharge: 2023-02-08 | Disposition: A | Discharge status: Home / Self Care | Payer: 59 | Source: Ambulatory Visit | Attending: Cardiology | Admitting: Cardiology

## 2023-02-08 ENCOUNTER — Other Ambulatory Visit (HOSPITAL_COMMUNITY): Payer: Self-pay

## 2023-02-08 ENCOUNTER — Encounter (HOSPITAL_COMMUNITY): Payer: Self-pay | Admitting: Cardiology

## 2023-02-08 VITALS — BP 120/60 | HR 63 | Wt 144.6 lb

## 2023-02-08 DIAGNOSIS — I5022 Chronic systolic (congestive) heart failure: Secondary | ICD-10-CM

## 2023-02-08 DIAGNOSIS — I110 Hypertensive heart disease with heart failure: Secondary | ICD-10-CM | POA: Diagnosis not present

## 2023-02-08 DIAGNOSIS — I255 Ischemic cardiomyopathy: Secondary | ICD-10-CM | POA: Diagnosis not present

## 2023-02-08 DIAGNOSIS — I2510 Atherosclerotic heart disease of native coronary artery without angina pectoris: Secondary | ICD-10-CM | POA: Diagnosis not present

## 2023-02-08 DIAGNOSIS — Z7984 Long term (current) use of oral hypoglycemic drugs: Secondary | ICD-10-CM | POA: Insufficient documentation

## 2023-02-08 DIAGNOSIS — E1151 Type 2 diabetes mellitus with diabetic peripheral angiopathy without gangrene: Secondary | ICD-10-CM | POA: Insufficient documentation

## 2023-02-08 DIAGNOSIS — R059 Cough, unspecified: Secondary | ICD-10-CM | POA: Insufficient documentation

## 2023-02-08 DIAGNOSIS — Z8673 Personal history of transient ischemic attack (TIA), and cerebral infarction without residual deficits: Secondary | ICD-10-CM | POA: Insufficient documentation

## 2023-02-08 DIAGNOSIS — I252 Old myocardial infarction: Secondary | ICD-10-CM | POA: Diagnosis not present

## 2023-02-08 DIAGNOSIS — I340 Nonrheumatic mitral (valve) insufficiency: Secondary | ICD-10-CM | POA: Insufficient documentation

## 2023-02-08 DIAGNOSIS — R1011 Right upper quadrant pain: Secondary | ICD-10-CM | POA: Insufficient documentation

## 2023-02-08 DIAGNOSIS — Z79899 Other long term (current) drug therapy: Secondary | ICD-10-CM | POA: Diagnosis not present

## 2023-02-08 DIAGNOSIS — G4733 Obstructive sleep apnea (adult) (pediatric): Secondary | ICD-10-CM | POA: Insufficient documentation

## 2023-02-08 DIAGNOSIS — Z7902 Long term (current) use of antithrombotics/antiplatelets: Secondary | ICD-10-CM | POA: Insufficient documentation

## 2023-02-08 DIAGNOSIS — E785 Hyperlipidemia, unspecified: Secondary | ICD-10-CM | POA: Diagnosis not present

## 2023-02-08 DIAGNOSIS — W19XXXA Unspecified fall, initial encounter: Secondary | ICD-10-CM | POA: Diagnosis not present

## 2023-02-08 DIAGNOSIS — Z87891 Personal history of nicotine dependence: Secondary | ICD-10-CM | POA: Insufficient documentation

## 2023-02-08 DIAGNOSIS — R079 Chest pain, unspecified: Secondary | ICD-10-CM | POA: Insufficient documentation

## 2023-02-08 DIAGNOSIS — I472 Ventricular tachycardia, unspecified: Secondary | ICD-10-CM | POA: Diagnosis not present

## 2023-02-08 DIAGNOSIS — I509 Heart failure, unspecified: Secondary | ICD-10-CM | POA: Diagnosis not present

## 2023-02-08 LAB — BASIC METABOLIC PANEL WITH GFR
Anion gap: 10 (ref 5–15)
BUN: 10 mg/dL (ref 8–23)
CO2: 28 mmol/L (ref 22–32)
Calcium: 9.6 mg/dL (ref 8.9–10.3)
Chloride: 102 mmol/L (ref 98–111)
Creatinine, Ser: 1.13 mg/dL (ref 0.61–1.24)
GFR, Estimated: >60 mL/min (ref >60)
Glucose, Bld: 104 mg/dL — ABNORMAL HIGH (ref 70–99)
Potassium: 4.9 mmol/L (ref 3.5–5.1)
Sodium: 140 mmol/L (ref 135–145)

## 2023-02-08 LAB — LIPID PANEL
Cholesterol: 166 mg/dL (ref 0–200)
HDL: 67 mg/dL (ref >40)
LDL Cholesterol: 71 mg/dL (ref 0–99)
Total CHOL/HDL Ratio: 2.5 RATIO
Triglycerides: 142 mg/dL (ref <150)
VLDL: 28 mg/dL (ref 0–40)

## 2023-02-08 LAB — BRAIN NATRIURETIC PEPTIDE: B Natriuretic Peptide: 155.4 pg/mL — ABNORMAL HIGH (ref 0.0–100.0)

## 2023-02-08 LAB — CBC
HCT: 46.2 % (ref 39.0–52.0)
Hemoglobin: 15 g/dL (ref 13.0–17.0)
MCH: 25.7 pg — ABNORMAL LOW (ref 26.0–34.0)
MCHC: 32.5 g/dL (ref 30.0–36.0)
MCV: 79.2 fL — ABNORMAL LOW (ref 80.0–100.0)
Platelets: 283 K/uL (ref 150–400)
RBC: 5.83 MIL/uL — ABNORMAL HIGH (ref 4.22–5.81)
RDW: 19 % — ABNORMAL HIGH (ref 11.5–15.5)
WBC: 6 K/uL (ref 4.0–10.5)
nRBC: 0 % (ref 0.0–0.2)

## 2023-02-08 MED ORDER — ENTRESTO 24-26 MG PO TABS: 1.0000 | ORAL_TABLET | Freq: Two times a day (BID) | ORAL | 3 refills | Status: DC

## 2023-02-08 NOTE — Progress Notes (Signed)
EKG CRITICAL VALUE     12 lead EKG performed.  Critical value noted.  Marjean Donna, RN notified.   Orma Flaming,  02/08/2023 2:25 PM

## 2023-02-08 NOTE — Patient Instructions (Addendum)
START Entresto 24/26 mg Twice daily  Labs done today, your results will be available in MyChart, we will contact you for abnormal readings.  Repeat blood work in 10 days at [facility] have been referred to the [facility]. Their office will call you to arrange your appointment.  A chest X-ray has been ordered for you. Any abnormal readings we will contact you about it.  Your physician recommends that you schedule a follow-up appointment in: 6 weeks  If you have any questions or concerns before your next appointment please send [place] a message through [place] or call our office at [phone].    TO LEAVE A MESSAGE FOR THE NURSE SELECT OPTION 2, PLEASE LEAVE A MESSAGE INCLUDING: YOUR NAME DATE OF BIRTH CALL BACK NUMBER REASON FOR CALL**this is important as we prioritize the call backs  YOU WILL RECEIVE A CALL BACK THE SAME DAY AS LONG AS YOU CALL BEFORE 4:00 PM  At the [facility], you and your health needs are our priority. As part of our continuing mission to provide you with exceptional heart care, we have created designated Provider Care Teams. These Care Teams include your primary Cardiologist (physician) and Advanced Practice Providers (APPs- Physician Assistants and Nurse Practitioners) who all work together to provide you with the care you need, when you need it.   You may see any of the following providers on your designated Care Team at your next follow up: Dr Arvilla Meres Dr Marca Ancona Dr. Marcos Eke, NP Robbie Lis, [place] [facility], [place] Brynda Peon, NP Karle Plumber, PharmD   Please be sure to bring in all your medications bottles to every appointment.    Thank you for choosing  [facility]

## 2023-02-10 NOTE — Progress Notes (Signed)
PCP: Ignatius Specking, MD Primary Cardiologist: Dr. Wyline Mood HF: Dr. Shirlee Latch  HPI:  Daniel Li is a 67 y.o. male with long history of CAD s/p multiple ACS episodes, HTN, HLD, tobacco abuse, DM, OSA, CVA and systolic CHF.    He has a history of CAD with NSTEMI in 2007 with BMS placement to his D1 and OM2. Also with history of STEMI in May 2013 at which time he had a BMS placed to his RCA. NSTEMI in January of 2014, cath showed CTO of left circumflex, unable to intervene upon. EF 55-60% in September 2016. Anterior STEMI 4/18, DES to pLAD.    Admitted 8/9 - 06/11/17 with chest pain. No targets for PCI, LAD stent patent. EF remained low at 20-25% as below. Now with St Jude ICD.    Admitted 4/16 - 02/17/2018 with NSTEMI. Underwent LHC again with unchanged anatomy, no intervention. Discharged back to assisted living.    He was admitted again in 10/19 with NSTEMI.  LHC showed totally occluded PLV, 75% PDA stenosis, 80% mid LAD, occluded LCx.  He had DES to mLAD. Repeat echo showed EF 20-25%.  RHC/LHC in 9/21 showed unchanged coronaries with occluded PLV and proximal LCx with collaterals, patent LAD, 60% D2, 60% PDA. Normal filling pressures and relatively preserved cardiac output.    Echo in 5/22 showed EF 25% with mild LV dilation, no LV thrombus, normal RV, mild MR.    Admitted 11/05/21 at [facility] for hypotension and AKI. Echo ordered but not completed. Received IVF and beta blocker and losartan stopped at discharge.    Admitted 11/20/21 to [facility] with CP, concern for ACS. R/LHC with 3-vessel disease with patent stents (no intervention) CO 3.0 and low PWCP at 7 mmHg. Echo showed EF~25%, no evidence of thrombus, warfarin stopped and he was transitioned to Plavix. Dig, losartan and spiro stopped due to AKI. PYP scan obtained and showed no evidence of TTR amyloidosis.    Admitted to [facility] 2/23 with CP and fall. Ethanol on admission 184. Troponin elevated but felt to be in setting of ETOH use and fall.    Echo  4/23: EF 20-25% with mild LV dilation, no LV thrombus, moderate-severe MR with ERO 0.39 cm^2 by PISA, normal RV, PASP 49.    Presented for TEE and RHC on 03/22/22 as part of workup for consideration of MitraClip. TEE with evidence of moderate to severe MR. RHC with elevated PCWP and prominent v-waves suggestive of severe MR, normal RA pressure, pulmonary venous HTN, low CO. He was direct admitted for overnight observation, given he has no one at home to stay with him post procedure. While admitted, structural heart team started assessment for MitraClip. Given marginal CO on cath and soft BP, Coreg was discontinued. Digoxin 0.125 mg added. He was noted to have 15 beat run of NSVT on tele. He was started on amiodarone.   He was admitted in 2/24 with right-sided chest pain, workup was negative.  UDS was + for cocaine though he denied use. Echo in 2/24 showec EF 20%, severe LV dilation, slow flow at apex w/o frank clot, RV normal, at least moderate MR, IVC normal.   Patient has been seen by Dr. Lynnette Caffey with plan for Mitraclip.  He has had dental evaluation and tooth removal that needed to be done pre-Mitraclip.  Evaluation has been very delayed due to extreme difficulty getting transportation to appointments from his group home.   Today he returns for HF followup. He has missed a number  of appointments due to difficulty with transportation from his group home and was quite late today.  This has been an ongoing issue in his management. Weight is up about 4 lbs from last appointment. He reports a fall several days ago onto his right side after tripped on a curb.  He has cough and right-sided chest pain that is worse with cough.  No fever.  He has RUQ pain. He says that dyspnea has been worse over the last few days with the cough.  Rare lightheadedness, no syncope or palpitations.  No orthopnea/paroxysmal nocturnal dyspnea.    ECG (personally reviewed): NSR, old lateral MI, ASMI (STE pattern in V1 and V2 that was  seen on prior ECGs).   Labs (6/23): K 4.4, creatinine 1.46, LFTs normal, TSH normal, Lp (a) 232 Labs (2/24): LDL 45, BNP 171, K 4.3, creatinine 1.33  PMH: 1. OSA: Supposed to be using CPAP.  2. HTN 3. CAD: Long history.  - NSTEMI 2007 with BMS D1 and OM2. - STEMI 5/13 with BMS RCA - NSTEMI 1/14 with CTO LCx and PLV, unable to open LCx.  - STEMI 4/18 with LHC showing old TO LCx with collaterals, old TO PLV, 70% ostial D1, totally occluded proximal LAD with some collaterals => DES to LAD.  - LHC 8/18 with old TO LCx, old TO PLV, 60% mLAD, patent LAD stent.  - LHC 4/19 with totally occluded PLV, totally occluded LCx, patent LAD and D1 stents, 50% mid LAD.  - NSTEMI 10/19. LHC with totally occluded PLV, totally occluded LCx, patent proximal LAD and D1 stents, 80% mid LAD stenosis => DES to mid LAD.  - LHC (9/21): occluded PLV and proximal LCx with collaterals, patent LAD, 60% D2, 60% PDA.  No intervention.  - LHC (1/23, [facility]): significant 3-vessel disease with patent stents; no further stents were placed 4. Depression 5. Type II DM 6. Hyperlipidemia: History of rhabdomyolysis with statins, he is on Zetia.  7. Chronic systolic CHF: Ischemic cardiomyopathy.  Echo from 9/16 showed improvement in EF to 55-60%.  St Jude ICD.  - Echo (4/18) with EF 20-25%, dyskinetic apex, moderate-severe MR, severe LAE.  - Echo (8/18) with EF 20-25%, moderate to severe MR - Echo (4/19) with EF 20-25%  - Echo (10/19) with EF 20-25%, no LV thrombus.  - RHC (10/19): mean RA 4, PA 22/7, mean PCWP 7, CI 2.3 - RHC (9/21): mean RA 1, PA 18/5, mean PCWP 7, CI 2.12 (F)/2.17(T) - Echo (5/22): EF 25% with mild LV dilation, no LV thrombus, normal RV, mild MR. - RHC (1/23): CO 3.0 and low PWCP at 7 mmHg - Echo (1/23): EF 25% - Echo (4/23): EF 20-25% with mild LV dilation, no LV thrombus, moderate-severe MR with ERO 0.39 cm^2 by PISA, normal RV, PASP 49.  - Echo (2/24): EF 20%, severe LV dilation, slow flow at apex w/o frank  clot, RV normal, at least moderate MR, IVC normal.  8. PAD: - ABIs (4/18) with R ABI of 0.84 suggestive of mild disease.  L ABI of 1.27 (Normal flow at rest) - ABIs (5/19): abnormal TBI on right.  - ABIs (10/21): Normal 9. LV thrombus 10. H/o CVA 11. Mitral regurgitation: Likely functional. Echo in 4/23 with moderate-severe MR. EE (5/23): EF 20-25%, RV mildly reduced, no LAA, severe MR with no mitral stenosis, MV mean gradient 2.0 mmHg - TEE (5/23): EF 20-25%, mild RV dysfunction, severe MR.   ROS: All systems negative except as listed in HPI, PMH  and Problem List.  SH:  Social History   Socioeconomic History   Marital status: Single    Spouse name: Not on file   Number of children: 0   Years of education: 9   Highest education level: 9th grade  Occupational History   Not on file  Tobacco Use   Smoking status: Some Days    Packs/day: 0.50    Years: 20.00    Additional pack years: 0.00    Total pack years: 10.00    Types: Cigarettes, Cigars    Start date: 07/21/1977   Smokeless tobacco: Never  Vaping Use   Vaping Use: Never used  Substance and Sexual Activity   Alcohol use: Yes    Comment: 09/17/2017 "nothing since 05/2017"   Drug use: Not Currently    Comment: previously incarcerated for drug related offense.   Sexual activity: Not Currently  Other Topics Concern   Not on file  Social History Narrative   Not on file   Social Determinants of Health   Financial Resource Strain: Medium Risk (10/01/2018)   Overall Financial Resource Strain (CARDIA)    Difficulty of Paying Living Expenses: Somewhat hard  Food Insecurity: No Food Insecurity (12/10/2022)   Hunger Vital Sign    Worried About Running Out of Food in the Last Year: Never true    Ran Out of Food in the Last Year: Never true  Transportation Needs: Unmet Transportation Needs (02/06/2023)   PRAPARE - Administrator, Civil Service (Medical): Yes    Lack of Transportation (Non-Medical): Yes  Physical  Activity: Not on file  Stress: Not on file  Social Connections: Not on file  Intimate Partner Violence: Not At Risk (12/10/2022)   Humiliation, Afraid, Rape, and Kick questionnaire    Fear of Current or Ex-Partner: No    Emotionally Abused: No    Physically Abused: No    Sexually Abused: No    FH:  Family History  Problem Relation Age of Onset   Stroke Mother    Heart attack Mother    Heart attack Father    Stroke Sister    Heart attack Sister    Heart attack Brother    Stroke Brother    Liver disease Neg Hx    Colon cancer Neg Hx      Current Outpatient Medications  Medication Sig Dispense Refill   acetaminophen (TYLENOL) 325 MG tablet Take 650 mg by mouth every 6 (six) hours as needed for mild pain, fever or headache.      amiodarone (PACERONE) 200 MG tablet TAKE (1) TABLET BY MOUTH ONCE DAILY. 90 tablet 3   aspirin EC 81 MG tablet Take 1 tablet (81 mg total) by mouth daily. Swallow whole. 90 tablet 3   clopidogrel (PLAVIX) 75 MG tablet Take 75 mg by mouth daily.     cyclobenzaprine (FLEXERIL) 10 MG tablet Take 1 tablet (10 mg total) by mouth 3 (three) times daily. 30 tablet 0   diclofenac Sodium (VOLTAREN) 1 % GEL Apply 2 g topically 4 (four) times daily as needed (pain).     digoxin (LANOXIN) 0.125 MG tablet TAKE (1) TABLET BY MOUTH ONCE DAILY. 30 tablet 10   empagliflozin (JARDIANCE) 10 MG TABS tablet Take 10 mg by mouth daily.     Evolocumab (REPATHA SURECLICK) 140 MG/ML SOAJ INJECT CONTENTS OF ONE PREFILLED PEN SUBCUTANEOUSLY INTO THE SKIN EVERY 14 DAYS. 2 mL 11   ezetimibe (ZETIA) 10 MG tablet Take 1 tablet (10 mg total)  by mouth daily. 30 tablet 3   gabapentin (NEURONTIN) 100 MG capsule Take 100 mg by mouth 2 (two) times daily.     meclizine (ANTIVERT) 25 MG tablet Take 1 tablet (25 mg total) by mouth 3 (three) times daily. 30 tablet 0   metFORMIN (GLUCOPHAGE) 500 MG tablet Take 500 mg by mouth daily with breakfast.     nitroGLYCERIN (NITROSTAT) 0.4 MG SL tablet PLACE  ONE (1) TABLET UNDER TONGUE EVERY 5 MINUTES UP TO (3) DOSES AS NEEDED FOR CHEST PAIN. IF NO RELIEF, CONTACT MD. 25 tablet 3   pantoprazole (PROTONIX) 40 MG tablet Take 40 mg by mouth daily.     potassium chloride (KLOR-CON) 10 MEQ tablet TAKE (1) TABLET BY MOUTH ONCE EVERY OTHER DAY. 30 tablet 11   sacubitril-valsartan (ENTRESTO) 24-26 MG Take 1 tablet by mouth 2 (two) times daily. 180 tablet 3   spironolactone (ALDACTONE) 25 MG tablet TAKE (1/2) TABLET (12.5MG ) BY MOUTH DAILY. 45 tablet 3   torsemide (DEMADEX) 20 MG tablet TAKE (1) TABLET BY MOUTH ONCE EVERY OTHER DAY. 15 tablet 11   No current facility-administered medications for this encounter.   BP 120/60   Pulse 63   Wt 65.6 kg (144 lb 9.6 oz)   SpO2 97%   BMI 23.34 kg/m   Wt Readings from Last 3 Encounters:  02/08/23 65.6 kg (144 lb 9.6 oz)  12/09/22 66.2 kg (146 lb)  11/23/22 66.2 kg (146 lb)   PHYSICAL EXAM: General: NAD Neck: No JVD, no thyromegaly or thyroid nodule.  Lungs: Clear to auscultation bilaterally with normal respiratory effort. CV: Nondisplaced PMI.  Heart regular S1/S2, no S3/S4, 2/6 HSM apex.  No peripheral edema.  No carotid bruit.  Difficult to palpate pedal pulses.  Abdomen: Soft, mild RUQ tenderness, no hepatosplenomegaly, no distention.  Skin: Intact without lesions or rashes.  Neurologic: Alert and oriented x 3.  Psych: Normal affect. Extremities: No clubbing or cyanosis.  HEENT: Normal.  MSK: Tenderness over right lower ribs.   ASSESSMENT & PLAN:  1. Chronic systolic CHF: Ischemic cardiomyopathy, St Jude ICD. Echo in 10/19 with EF 20-25%.  Echo in 5/22 with EF 25%, normal RV.  Echo 1/23 showed EF 25%. R/LHC in 1/23 at [facility] showed no new CAD, preserved CO and low PCWP suggesting over-diuresis. PYP scan obtained and did not suggest TTR amyloidosis. Echo 4/23 showed EF 20-25% with mild LV dilation, no LV thrombus, moderate-severe MR with ERO 0.39 cm^2 by PISA, normal RV, PASP 49. RHC in 5/23 showed RA  mean 2, PA 49/12 (31), PCWP mean 21 with prominent v waves to 45 suggesting severe MR, Fick CO/CI 3.81/2.18.  Last echo in 2/24 showed EF 20%, severe LV dilation, slow flow at apex w/o frank clot, RV normal, at least moderate MR, IVC normal. NYHA class III symptoms somewhat confounded by cough and pleuritic right-sided chest pain that may be due to recent fall/rib fracture given chest wall tenderness.  He is not volume overloaded on exam.  - Continue digoxin. Check level today. - Continue torsemide 20 mg every other day. BMET/BNP today. - Continue spironolactone 12.5 mg daily.  - Continue Jardiance 10 mg daily.  - Beta blocker stopped in 5/23 with borderline low output - I will add Entresto 24/26 bid today with BMET in 10 days locally.  2. Mitral regurgitation: Echo 04/23 showed moderate-severe likely functional MR.  He may benefit from Mitraclip procedure. TEE 5/23 with severe functional MR with restricted posterior leaflet. Elevated PCWP with prominent  V waves on RHC in 5/23 suggestive of severe MR.  Patient has been seen by structural heart team and he has had dental extractions.  Workup has been considerably slowed by difficulty with arranging transportation from his group home.  - Will contact Dr. Lynnette Caffey to see where we are in terms of getting him scheduled.  3. CAD:  Extensive prior history of CAD.  He has known total occlusion LCx and PLV.  NSTEMI 10/19 with DES to mid LAD.  Coronary angiography in 9/21 showed stable coronary disease, no intervention was done.  Admission 1/23 to [facility] concerning for ACS, however LHC showed patent stents, no new stents placed. Warfarin was stopped and continued on clopidogrel. No recent ischemic-type chest pain.  - Continue clopidogrel.   - No statin given history of rhabdomyolysis on statin.  He is now on Repatha and Zetia. Check lipids today.  4. Diabetes: Taking SGLT2i. No GU symptoms. 5. Smoking: He has quit.  6. PAD: No claudication, foot ulcers. ABIs in  10/21 were normal.    7. LV thrombus:  Echo 4/23 showed no LV thrombus.  Warfarin was stopped during one of his admissions at [facility]. No LV thrombus on TEE (5/23). Will leave off for now.  8. 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  - As above, off Coumadin and on clopidogrel. 9. ETOH use: No further ETOH use. Congratulated on cessation. 10. NSVT: Now off Coreg with marginal output and soft BP.  - Continue amiodarone at 200 mg daily. Check LFTs and TSH. Needs regular eye exam.  11. Cocaine abuse: He was cocaine positive during 2/24 admission.  Denies use.  12. Right-sided chest pain: Associated with tenderness over right-sided ribs and RUQ pain as well as cough.  This started after a mechanical fall during which he struck his right chest.  - CXR PA/lateral: ?rib fracture.   - If unrevealing, needs followup with PCP.   Patient will followup in 6 wks with APP.  I will contact Dr. Lynnette Caffey about Mitraclip scheduling.   Daniel Li 02/10/23

## 2023-02-11 ENCOUNTER — Ambulatory Visit (INDEPENDENT_AMBULATORY_CARE_PROVIDER_SITE_OTHER): Payer: 59

## 2023-02-11 DIAGNOSIS — I5022 Chronic systolic (congestive) heart failure: Secondary | ICD-10-CM

## 2023-02-11 NOTE — Telephone Encounter (Signed)
After several attempts, finally reached through to a person on the end of the line. She stated the phone line and fax number are the same and that is why it is difficult to get through.  She stated [name] was not in and to try again tomorrow morning.

## 2023-02-12 LAB — CUP PACEART REMOTE DEVICE CHECK
Battery Remaining Longevity: 56 mo
Battery Remaining Percentage: 53 %
Battery Voltage: 2.96 V
Brady Statistic RV Percent Paced: 1 %
Date Time Interrogation Session: 20240416082529
HighPow Impedance: 65 Ohm
HighPow Impedance: 65 Ohm
Implantable Lead Connection Status: 753985
Implantable Lead Implant Date: 20181120
Implantable Lead Location: 753860
Implantable Pulse Generator Implant Date: 20181120
Lead Channel Impedance Value: 340 Ohm
Lead Channel Pacing Threshold Amplitude: 0.75 V
Lead Channel Pacing Threshold Pulse Width: 0.5 ms
Lead Channel Sensing Intrinsic Amplitude: 12 mV
Lead Channel Setting Pacing Amplitude: 2.5 V
Lead Channel Setting Pacing Pulse Width: 0.5 ms
Lead Channel Setting Sensing Sensitivity: 0.5 mV
Pulse Gen Serial Number: 9786940
Zone Setting Status: 755011

## 2023-02-14 NOTE — Telephone Encounter (Signed)
Spoke directly with the patient, who wishes to proceed with mTEER on 03/20/2023. Scheduled him for visit with Dr. Lynnette Caffey on 03/13/2023. I asked the patient multiple times if he has any family members who he'd like to bring him and stay in the hospital, and he declined.  Confirmed with home attendant they will be able to provide transportation to and from the visit 5/15, but may need assistance DOS as they do not run that early.

## 2023-02-18 ENCOUNTER — Ambulatory Visit: Payer: 59

## 2023-02-18 DIAGNOSIS — Z95810 Presence of automatic (implantable) cardiac defibrillator: Secondary | ICD-10-CM

## 2023-02-18 DIAGNOSIS — I5022 Chronic systolic (congestive) heart failure: Secondary | ICD-10-CM

## 2023-02-22 ENCOUNTER — Telehealth: Payer: Self-pay

## 2023-02-22 NOTE — Telephone Encounter (Signed)
Remote ICM transmission received.  Attempted call to patient regarding ICM remote transmission and no answer or voice mail option.  

## 2023-02-22 NOTE — Progress Notes (Signed)
EPIC Encounter for ICM Monitoring  Patient Name: Daniel Li is a 67 y.o. male Date: 02/22/2023 Primary Care Physican: Ignatius Specking, MD Primary Cardiologist: [facility] Electrophysiologist: [facility] 08/10/2022 Office Weight: 140 lbs                                                  Attempted call to patient and unable to reach.  Transmission reviewed.    CorVue thoracic impedance suggesting possible fluid accumulation starting 4/22.      Prescribed dosage:  Torsemide 20 mg Take 1 tablet by mouth as needed for swelling or 3 pound weight gain Potassium 10 mEq take 1 tablet by mouth every other day Spironolactone 25 mg take 0.5 tablet (12.5 mg total) daily   Labs: 02/08/2023 Creatinine 1.13, BUN 10, Potassium 4.9, Sodium 140, Gfr >60 08/21/2022 Creatinine 1.62, BUN 14, Potassium 4.6, Sodium 140, GFR 47 08/10/2022 Creatinine 1.50, BUN 15, Potassium 4.3, Sodium 140, GFR 51 04/05/2022 Creatinine 1.46, BUN 12, Potassium 4.4, Sodium 142, GFR 53 A complete set of results can be found in Results Review.   Recommendations:  Unable to reach.     Follow-up plan: ICM clinic phone appointment on 02/25/2023 to recheck fluid levels.    91 day device clinic remote transmission 02/11/2023.     EP/Cardiology Office Visits:    03/12/2023 with HF clinic.  03/13/2023 with Dr Lynnette Caffey.   Recall 04/04/2023 with Ulyses Jarred, PA.     Copy of ICM check sent to Dr. Nelly Laurence.   Will send copy to Dr Shirlee Latch for review if patient is reached.   3 month ICM trend: 02/20/2023.    12-14 Month ICM trend:     Karie Soda, RN 02/22/2023 8:23 AM

## 2023-02-24 DIAGNOSIS — Z7984 Long term (current) use of oral hypoglycemic drugs: Secondary | ICD-10-CM | POA: Diagnosis not present

## 2023-02-24 DIAGNOSIS — I517 Cardiomegaly: Secondary | ICD-10-CM | POA: Diagnosis not present

## 2023-02-24 DIAGNOSIS — R079 Chest pain, unspecified: Secondary | ICD-10-CM | POA: Diagnosis not present

## 2023-02-24 DIAGNOSIS — I2510 Atherosclerotic heart disease of native coronary artery without angina pectoris: Secondary | ICD-10-CM | POA: Diagnosis not present

## 2023-02-24 DIAGNOSIS — J439 Emphysema, unspecified: Secondary | ICD-10-CM | POA: Diagnosis not present

## 2023-02-24 DIAGNOSIS — Z7902 Long term (current) use of antithrombotics/antiplatelets: Secondary | ICD-10-CM | POA: Diagnosis not present

## 2023-02-24 DIAGNOSIS — I447 Left bundle-branch block, unspecified: Secondary | ICD-10-CM | POA: Diagnosis not present

## 2023-02-24 DIAGNOSIS — Z8673 Personal history of transient ischemic attack (TIA), and cerebral infarction without residual deficits: Secondary | ICD-10-CM | POA: Diagnosis not present

## 2023-02-24 DIAGNOSIS — R1084 Generalized abdominal pain: Secondary | ICD-10-CM | POA: Diagnosis not present

## 2023-02-24 DIAGNOSIS — Z955 Presence of coronary angioplasty implant and graft: Secondary | ICD-10-CM | POA: Diagnosis not present

## 2023-02-24 DIAGNOSIS — R0602 Shortness of breath: Secondary | ICD-10-CM | POA: Diagnosis not present

## 2023-02-24 DIAGNOSIS — E1169 Type 2 diabetes mellitus with other specified complication: Secondary | ICD-10-CM | POA: Diagnosis not present

## 2023-02-24 DIAGNOSIS — Z7982 Long term (current) use of aspirin: Secondary | ICD-10-CM | POA: Diagnosis not present

## 2023-02-24 DIAGNOSIS — I509 Heart failure, unspecified: Secondary | ICD-10-CM | POA: Diagnosis not present

## 2023-02-24 DIAGNOSIS — Z95810 Presence of automatic (implantable) cardiac defibrillator: Secondary | ICD-10-CM | POA: Diagnosis not present

## 2023-02-24 DIAGNOSIS — I25118 Atherosclerotic heart disease of native coronary artery with other forms of angina pectoris: Secondary | ICD-10-CM | POA: Diagnosis not present

## 2023-02-24 DIAGNOSIS — I340 Nonrheumatic mitral (valve) insufficiency: Secondary | ICD-10-CM | POA: Diagnosis not present

## 2023-02-24 DIAGNOSIS — I440 Atrioventricular block, first degree: Secondary | ICD-10-CM | POA: Diagnosis not present

## 2023-02-24 DIAGNOSIS — I255 Ischemic cardiomyopathy: Secondary | ICD-10-CM | POA: Diagnosis not present

## 2023-02-24 DIAGNOSIS — I2699 Other pulmonary embolism without acute cor pulmonale: Secondary | ICD-10-CM | POA: Diagnosis not present

## 2023-02-24 DIAGNOSIS — I959 Hypotension, unspecified: Secondary | ICD-10-CM | POA: Diagnosis not present

## 2023-02-24 DIAGNOSIS — K219 Gastro-esophageal reflux disease without esophagitis: Secondary | ICD-10-CM | POA: Diagnosis not present

## 2023-02-24 DIAGNOSIS — I252 Old myocardial infarction: Secondary | ICD-10-CM | POA: Diagnosis not present

## 2023-02-24 DIAGNOSIS — Z79899 Other long term (current) drug therapy: Secondary | ICD-10-CM | POA: Diagnosis not present

## 2023-02-24 DIAGNOSIS — R0781 Pleurodynia: Secondary | ICD-10-CM | POA: Diagnosis not present

## 2023-02-24 DIAGNOSIS — E785 Hyperlipidemia, unspecified: Secondary | ICD-10-CM | POA: Diagnosis not present

## 2023-02-24 DIAGNOSIS — E119 Type 2 diabetes mellitus without complications: Secondary | ICD-10-CM | POA: Diagnosis not present

## 2023-02-24 DIAGNOSIS — Z7901 Long term (current) use of anticoagulants: Secondary | ICD-10-CM | POA: Diagnosis not present

## 2023-02-24 DIAGNOSIS — Z0389 Encounter for observation for other suspected diseases and conditions ruled out: Secondary | ICD-10-CM | POA: Diagnosis not present

## 2023-02-24 DIAGNOSIS — I5022 Chronic systolic (congestive) heart failure: Secondary | ICD-10-CM | POA: Diagnosis not present

## 2023-02-24 DIAGNOSIS — I714 Abdominal aortic aneurysm, without rupture, unspecified: Secondary | ICD-10-CM | POA: Diagnosis not present

## 2023-02-24 DIAGNOSIS — J9811 Atelectasis: Secondary | ICD-10-CM | POA: Diagnosis not present

## 2023-02-24 DIAGNOSIS — R109 Unspecified abdominal pain: Secondary | ICD-10-CM | POA: Diagnosis not present

## 2023-02-24 DIAGNOSIS — E118 Type 2 diabetes mellitus with unspecified complications: Secondary | ICD-10-CM | POA: Diagnosis not present

## 2023-02-24 DIAGNOSIS — I5042 Chronic combined systolic (congestive) and diastolic (congestive) heart failure: Secondary | ICD-10-CM | POA: Diagnosis not present

## 2023-02-25 ENCOUNTER — Ambulatory Visit: Payer: 59 | Attending: Cardiovascular Disease

## 2023-02-25 ENCOUNTER — Telehealth: Payer: Self-pay

## 2023-02-25 DIAGNOSIS — I5022 Chronic systolic (congestive) heart failure: Secondary | ICD-10-CM

## 2023-02-25 DIAGNOSIS — Z95810 Presence of automatic (implantable) cardiac defibrillator: Secondary | ICD-10-CM

## 2023-02-25 NOTE — Telephone Encounter (Signed)
The patient was contacted 02/07/23 (see 11/27/22 phone encounter for more details) and chose to schedule mTEER 03/20/23. Pre-procedure visit with Dr. Lynnette Caffey was scheduled 03/13/2023.  Upon chart review today, the patient is hospitalized at [facility] for PEs and was started on Eliquis yesterday.   Will route to Dr. Lynnette Caffey. Will continue to monitor patient status and update mTEER plan once patient is discharged.

## 2023-02-26 NOTE — Progress Notes (Signed)
EPIC Encounter for ICM Monitoring  Patient Name: Daniel Li is a 67 y.o. male Date: 02/26/2023 Primary Care Physican: Ignatius Specking, MD Primary Cardiologist: [facility] Electrophysiologist: [facility] 08/10/2022 Office Weight: 140 lbs                                                  Pt admitted to [facility] 4/28 for PE.   CorVue thoracic impedance suggesting possible fluid accumulation starting 4/22.      Prescribed dosage:  Torsemide 20 mg Take 1 tablet by mouth as needed for swelling or 3 pound weight gain Potassium 10 mEq take 1 tablet by mouth every other day Spironolactone 25 mg take 0.5 tablet (12.5 mg total) daily   Labs: 02/08/2023 Creatinine 1.13, BUN 10, Potassium 4.9, Sodium 140, Gfr >60 08/21/2022 Creatinine 1.62, BUN 14, Potassium 4.6, Sodium 140, GFR 47 08/10/2022 Creatinine 1.50, BUN 15, Potassium 4.3, Sodium 140, GFR 51 04/05/2022 Creatinine 1.46, BUN 12, Potassium 4.4, Sodium 142, GFR 53 A complete set of results can be found in Results Review.   Recommendations:  Currently hospitalized.   Follow-up plan: ICM clinic phone appointment on 03/04/2023 to recheck fluid levels.    91 day device clinic remote transmission 05/13/2023.     EP/Cardiology Office Visits:    03/12/2023 with HF clinic.  03/13/2023 with Dr Lynnette Caffey.   Recall 04/04/2023 with Ulyses Jarred, PA.     Copy of ICM check sent to Dr. Nelly Laurence.     3 month ICM trend: 02/22/2023.    12-14 Month ICM trend:     Karie Soda, RN 02/26/2023 10:20 AM

## 2023-03-01 DIAGNOSIS — I2699 Other pulmonary embolism without acute cor pulmonale: Secondary | ICD-10-CM | POA: Diagnosis not present

## 2023-03-06 NOTE — Progress Notes (Signed)
No ICM remote transmission received for 03/04/2023 and next ICM transmission scheduled for 03/18/2023.

## 2023-03-07 DIAGNOSIS — E1165 Type 2 diabetes mellitus with hyperglycemia: Secondary | ICD-10-CM | POA: Diagnosis not present

## 2023-03-07 DIAGNOSIS — I2699 Other pulmonary embolism without acute cor pulmonale: Secondary | ICD-10-CM | POA: Diagnosis not present

## 2023-03-07 DIAGNOSIS — Z09 Encounter for follow-up examination after completed treatment for conditions other than malignant neoplasm: Secondary | ICD-10-CM | POA: Diagnosis not present

## 2023-03-07 DIAGNOSIS — I429 Cardiomyopathy, unspecified: Secondary | ICD-10-CM | POA: Diagnosis not present

## 2023-03-07 DIAGNOSIS — I10 Essential (primary) hypertension: Secondary | ICD-10-CM | POA: Diagnosis not present

## 2023-03-11 ENCOUNTER — Telehealth (HOSPITAL_COMMUNITY): Payer: Self-pay

## 2023-03-11 NOTE — Telephone Encounter (Signed)
Discussed with Dr. Lynnette Caffey. Due to recent admission requiring initiation of a/c therapy secondary to PE, will postpone the patient's visit with Dr. Lynnette Caffey for 1 month.  Called and spoke with [facility]. Rescheduled visit from 5/15 to 6/24 with Dr. Lynnette Caffey. Nurse confirmed transportation. She was grateful for call and agreed with plan.

## 2023-03-11 NOTE — Telephone Encounter (Signed)
Called and spoke to patient's caregiver  to confirm/remind patient of their appointment at the [facility] on 03/12/23.

## 2023-03-12 ENCOUNTER — Ambulatory Visit (HOSPITAL_COMMUNITY)
Admission: RE | Admit: 2023-03-12 | Discharge: 2023-03-12 | Disposition: A | Discharge status: Home / Self Care | Payer: 59 | Source: Ambulatory Visit | Attending: Internal Medicine | Admitting: Internal Medicine

## 2023-03-12 ENCOUNTER — Encounter (HOSPITAL_COMMUNITY): Payer: Self-pay

## 2023-03-12 ENCOUNTER — Encounter: Payer: 59 | Admitting: *Deleted

## 2023-03-12 VITALS — BP 132/80 | HR 65 | Wt 155.4 lb

## 2023-03-12 DIAGNOSIS — Z955 Presence of coronary angioplasty implant and graft: Secondary | ICD-10-CM | POA: Diagnosis not present

## 2023-03-12 DIAGNOSIS — I472 Ventricular tachycardia, unspecified: Secondary | ICD-10-CM | POA: Insufficient documentation

## 2023-03-12 DIAGNOSIS — Z7984 Long term (current) use of oral hypoglycemic drugs: Secondary | ICD-10-CM | POA: Insufficient documentation

## 2023-03-12 DIAGNOSIS — I110 Hypertensive heart disease with heart failure: Secondary | ICD-10-CM | POA: Diagnosis not present

## 2023-03-12 DIAGNOSIS — I340 Nonrheumatic mitral (valve) insufficiency: Secondary | ICD-10-CM | POA: Diagnosis not present

## 2023-03-12 DIAGNOSIS — I2510 Atherosclerotic heart disease of native coronary artery without angina pectoris: Secondary | ICD-10-CM

## 2023-03-12 DIAGNOSIS — Z7902 Long term (current) use of antithrombotics/antiplatelets: Secondary | ICD-10-CM | POA: Diagnosis not present

## 2023-03-12 DIAGNOSIS — Z5986 Financial insecurity: Secondary | ICD-10-CM | POA: Insufficient documentation

## 2023-03-12 DIAGNOSIS — Z79899 Other long term (current) drug therapy: Secondary | ICD-10-CM | POA: Insufficient documentation

## 2023-03-12 DIAGNOSIS — R059 Cough, unspecified: Secondary | ICD-10-CM | POA: Insufficient documentation

## 2023-03-12 DIAGNOSIS — I255 Ischemic cardiomyopathy: Secondary | ICD-10-CM | POA: Diagnosis not present

## 2023-03-12 DIAGNOSIS — I252 Old myocardial infarction: Secondary | ICD-10-CM | POA: Insufficient documentation

## 2023-03-12 DIAGNOSIS — E119 Type 2 diabetes mellitus without complications: Secondary | ICD-10-CM | POA: Diagnosis not present

## 2023-03-12 DIAGNOSIS — E785 Hyperlipidemia, unspecified: Secondary | ICD-10-CM | POA: Diagnosis not present

## 2023-03-12 DIAGNOSIS — G4733 Obstructive sleep apnea (adult) (pediatric): Secondary | ICD-10-CM | POA: Insufficient documentation

## 2023-03-12 DIAGNOSIS — I739 Peripheral vascular disease, unspecified: Secondary | ICD-10-CM

## 2023-03-12 DIAGNOSIS — I5022 Chronic systolic (congestive) heart failure: Secondary | ICD-10-CM | POA: Diagnosis not present

## 2023-03-12 DIAGNOSIS — Z8249 Family history of ischemic heart disease and other diseases of the circulatory system: Secondary | ICD-10-CM | POA: Diagnosis not present

## 2023-03-12 DIAGNOSIS — Z006 Encounter for examination for normal comparison and control in clinical research program: Secondary | ICD-10-CM

## 2023-03-12 DIAGNOSIS — I513 Intracardiac thrombosis, not elsewhere classified: Secondary | ICD-10-CM

## 2023-03-12 DIAGNOSIS — Z87891 Personal history of nicotine dependence: Secondary | ICD-10-CM | POA: Insufficient documentation

## 2023-03-12 DIAGNOSIS — Z823 Family history of stroke: Secondary | ICD-10-CM | POA: Diagnosis not present

## 2023-03-12 DIAGNOSIS — E8770 Fluid overload, unspecified: Secondary | ICD-10-CM | POA: Diagnosis not present

## 2023-03-12 DIAGNOSIS — Z8673 Personal history of transient ischemic attack (TIA), and cerebral infarction without residual deficits: Secondary | ICD-10-CM | POA: Diagnosis not present

## 2023-03-12 DIAGNOSIS — Z95810 Presence of automatic (implantable) cardiac defibrillator: Secondary | ICD-10-CM | POA: Insufficient documentation

## 2023-03-12 DIAGNOSIS — Z5982 Transportation insecurity: Secondary | ICD-10-CM | POA: Diagnosis not present

## 2023-03-12 DIAGNOSIS — I5042 Chronic combined systolic (congestive) and diastolic (congestive) heart failure: Secondary | ICD-10-CM

## 2023-03-12 DIAGNOSIS — E1159 Type 2 diabetes mellitus with other circulatory complications: Secondary | ICD-10-CM

## 2023-03-12 DIAGNOSIS — F1410 Cocaine abuse, uncomplicated: Secondary | ICD-10-CM

## 2023-03-12 DIAGNOSIS — R079 Chest pain, unspecified: Secondary | ICD-10-CM

## 2023-03-12 DIAGNOSIS — F1011 Alcohol abuse, in remission: Secondary | ICD-10-CM

## 2023-03-12 DIAGNOSIS — I4729 Other ventricular tachycardia: Secondary | ICD-10-CM

## 2023-03-12 LAB — BASIC METABOLIC PANEL
Anion gap: 9 (ref 5–15)
BUN: 13 mg/dL (ref 8–23)
CO2: 29 mmol/L (ref 22–32)
Calcium: 9.3 mg/dL (ref 8.9–10.3)
Chloride: 104 mmol/L (ref 98–111)
Creatinine, Ser: 1.21 mg/dL (ref 0.61–1.24)
GFR, Estimated: >60 mL/min (ref >60)
Glucose, Bld: 92 mg/dL (ref 70–99)
Potassium: 3.7 mmol/L (ref 3.5–5.1)
Sodium: 142 mmol/L (ref 135–145)

## 2023-03-12 LAB — DIGOXIN LEVEL: Digoxin Level: 0.9 ng/mL (ref 0.8–2.0)

## 2023-03-12 LAB — BRAIN NATRIURETIC PEPTIDE: B Natriuretic Peptide: 449.4 pg/mL — ABNORMAL HIGH (ref 0.0–100.0)

## 2023-03-12 NOTE — Progress Notes (Signed)
ADVANCED HF CLINIC NOTE   PCP: Ignatius Specking, MD Primary Cardiologist: Dr. Wyline Mood HF: Dr. Shirlee Latch  HPI: Daniel Li is a 68 y.o. male with long history of CAD s/p multiple ACS episodes, HTN, HLD, tobacco abuse, DM, OSA, CVA and systolic CHF.    He has a history of CAD with NSTEMI in 2007 with BMS placement to his D1 and OM2. Also with history of STEMI in May 2013 at which time he had a BMS placed to his RCA. NSTEMI in January of 2014, cath showed CTO of left circumflex, unable to intervene upon. EF 55-60% in September 2016. Anterior STEMI 4/18, DES to pLAD.    Admitted 8/9 - 06/11/17 with chest pain. No targets for PCI, LAD stent patent. EF remained low at 20-25% as below. Now with St Jude ICD.    Admitted 4/16 - 02/17/2018 with NSTEMI. Underwent LHC again with unchanged anatomy, no intervention. Discharged back to assisted living.    He was admitted again in 10/19 with NSTEMI.  LHC showed totally occluded PLV, 75% PDA stenosis, 80% mid LAD, occluded LCx.  He had DES to mLAD. Repeat echo showed EF 20-25%.  RHC/LHC in 9/21 showed unchanged coronaries with occluded PLV and proximal LCx with collaterals, patent LAD, 60% D2, 60% PDA. Normal filling pressures and relatively preserved cardiac output.    Echo in 5/22 showed EF 25% with mild LV dilation, no LV thrombus, normal RV, mild MR.    Admitted 11/05/21 at [facility] for hypotension and AKI. Echo ordered but not completed. Received IVF and beta blocker and losartan stopped at discharge.    Admitted 11/20/21 to [facility] with CP, concern for ACS. R/LHC with 3-vessel disease with patent stents (no intervention) CO 3.0 and low PWCP at 7 mmHg. Echo showed EF~25%, no evidence of thrombus, warfarin stopped and he was transitioned to Plavix. Dig, losartan and spiro stopped due to AKI. PYP scan obtained and showed no evidence of TTR amyloidosis.    Admitted to [facility] 2/23 with CP and fall. Ethanol on admission 184. Troponin elevated but felt to be in setting of  ETOH use and fall.    Echo 4/23: EF 20-25% with mild LV dilation, no LV thrombus, moderate-severe MR with ERO 0.39 cm^2 by PISA, normal RV, PASP 49.    Presented for TEE and RHC on 03/22/22 as part of workup for consideration of MitraClip. TEE with evidence of moderate to severe MR. RHC with elevated PCWP and prominent v-waves suggestive of severe MR, normal RA pressure, pulmonary venous HTN, low CO. He was direct admitted for overnight observation, given he has no one at home to stay with him post procedure. While admitted, structural heart team started assessment for MitraClip. Given marginal CO on cath and soft BP, Coreg was discontinued. Digoxin 0.125 mg added. He was noted to have 15 beat run of NSVT on tele. He was started on amiodarone.   He was admitted in 2/24 with right-sided chest pain, workup was negative.  UDS was + for cocaine though he denied use. Echo in 2/24 showec EF 20%, severe LV dilation, slow flow at apex w/o frank clot, RV normal, at least moderate MR, IVC normal.   Patient has been seen by Dr. Lynnette Caffey with plan for Mitraclip.  He has had dental evaluation and tooth removal that needed to be done pre-Mitraclip.  Evaluation has been very delayed due to extreme difficulty getting transportation to appointments from his group home.   Today he returns for AHF  follow up with facility manager. Overall feeling good. Denies palpitations, CP, dizziness, edema, or PND/Orthopnea. Sometimes gets SOB with brisk movements. Appetite good. No fever or chills. Weight at home 150 pounds. Taking all medications. Lives in assisted living facility.   ECG (personally reviewed): No new EKG today  Labs (6/23): K 4.4, creatinine 1.46, LFTs normal, TSH normal, Lp (a) 232 Labs (2/24): LDL 45, BNP 171, K 4.3, creatinine 1.33 Labs (4/24): K 4.1, SCr 1.1  PMH: 1. OSA: Supposed to be using CPAP.  2. HTN 3. CAD: Long history.  - NSTEMI 2007 with BMS D1 and OM2. - STEMI 5/13 with BMS RCA - NSTEMI 1/14  with CTO LCx and PLV, unable to open LCx.  - STEMI 4/18 with LHC showing old TO LCx with collaterals, old TO PLV, 70% ostial D1, totally occluded proximal LAD with some collaterals => DES to LAD.  - LHC 8/18 with old TO LCx, old TO PLV, 60% mLAD, patent LAD stent.  - LHC 4/19 with totally occluded PLV, totally occluded LCx, patent LAD and D1 stents, 50% mid LAD.  - NSTEMI 10/19. LHC with totally occluded PLV, totally occluded LCx, patent proximal LAD and D1 stents, 80% mid LAD stenosis => DES to mid LAD.  - LHC (9/21): occluded PLV and proximal LCx with collaterals, patent LAD, 60% D2, 60% PDA.  No intervention.  - LHC (1/23, [facility]): significant 3-vessel disease with patent stents; no further stents were placed 4. Depression 5. Type II DM 6. Hyperlipidemia: History of rhabdomyolysis with statins, he is on Zetia.  7. Chronic systolic CHF: Ischemic cardiomyopathy.  Echo from 9/16 showed improvement in EF to 55-60%.  St Jude ICD.  - Echo (4/18) with EF 20-25%, dyskinetic apex, moderate-severe MR, severe LAE.  - Echo (8/18) with EF 20-25%, moderate to severe MR - Echo (4/19) with EF 20-25%  - Echo (10/19) with EF 20-25%, no LV thrombus.  - RHC (10/19): mean RA 4, PA 22/7, mean PCWP 7, CI 2.3 - RHC (9/21): mean RA 1, PA 18/5, mean PCWP 7, CI 2.12 (F)/2.17(T) - Echo (5/22): EF 25% with mild LV dilation, no LV thrombus, normal RV, mild MR. - RHC (1/23): CO 3.0 and low PWCP at 7 mmHg - Echo (1/23): EF 25% - Echo (4/23): EF 20-25% with mild LV dilation, no LV thrombus, moderate-severe MR with ERO 0.39 cm^2 by PISA, normal RV, PASP 49.  - Echo (2/24): EF 20%, severe LV dilation, slow flow at apex w/o frank clot, RV normal, at least moderate MR, IVC normal.  8. PAD: - ABIs (4/18) with R ABI of 0.84 suggestive of mild disease.  L ABI of 1.27 (Normal flow at rest) - ABIs (5/19): abnormal TBI on right.  - ABIs (10/21): Normal 9. LV thrombus 10. H/o CVA 11. Mitral regurgitation: Likely functional. Echo in  4/23 with moderate-severe MR. EE (5/23): EF 20-25%, RV mildly reduced, no LAA, severe MR with no mitral stenosis, MV mean gradient 2.0 mmHg - TEE (5/23): EF 20-25%, mild RV dysfunction, severe MR.   ROS: All systems negative except as listed in HPI, PMH and Problem List.  SH:  Social History   Socioeconomic History   Marital status: Single    Spouse name: Not on file   Number of children: 0   Years of education: 9   Highest education level: 9th grade  Occupational History   Not on file  Tobacco Use   Smoking status: Some Days    Packs/day: 0.50  Years: 20.00    Additional pack years: 0.00    Total pack years: 10.00    Types: Cigarettes, Cigars    Start date: 07/21/1977   Smokeless tobacco: Never  Vaping Use   Vaping Use: Never used  Substance and Sexual Activity   Alcohol use: Yes    Comment: 09/17/2017 "nothing since 05/2017"   Drug use: Not Currently    Comment: previously incarcerated for drug related offense.   Sexual activity: Not Currently  Other Topics Concern   Not on file  Social History Narrative   Not on file   Social Determinants of Health   Financial Resource Strain: Medium Risk (10/01/2018)   Overall Financial Resource Strain (CARDIA)    Difficulty of Paying Living Expenses: Somewhat hard  Food Insecurity: No Food Insecurity (12/10/2022)   Hunger Vital Sign    Worried About Running Out of Food in the Last Year: Never true    Ran Out of Food in the Last Year: Never true  Transportation Needs: Unmet Transportation Needs (02/06/2023)   PRAPARE - Administrator, Civil Service (Medical): Yes    Lack of Transportation (Non-Medical): Yes  Physical Activity: Not on file  Stress: Not on file  Social Connections: Not on file  Intimate Partner Violence: Not At Risk (12/10/2022)   Humiliation, Afraid, Rape, and Kick questionnaire    Fear of Current or Ex-Partner: No    Emotionally Abused: No    Physically Abused: No    Sexually Abused: No   FH:   Family History  Problem Relation Age of Onset   Stroke Mother    Heart attack Mother    Heart attack Father    Stroke Sister    Heart attack Sister    Heart attack Brother    Stroke Brother    Liver disease Neg Hx    Colon cancer Neg Hx    Current Outpatient Medications  Medication Sig Dispense Refill   acetaminophen (TYLENOL) 325 MG tablet Take 650 mg by mouth every 6 (six) hours as needed for mild pain, fever or headache.      amiodarone (PACERONE) 200 MG tablet TAKE (1) TABLET BY MOUTH ONCE DAILY. 90 tablet 3   aspirin EC 81 MG tablet Take 1 tablet (81 mg total) by mouth daily. Swallow whole. 90 tablet 3   clopidogrel (PLAVIX) 75 MG tablet Take 75 mg by mouth daily.     cyclobenzaprine (FLEXERIL) 10 MG tablet Take 1 tablet (10 mg total) by mouth 3 (three) times daily. 30 tablet 0   diclofenac Sodium (VOLTAREN) 1 % GEL Apply 2 g topically 4 (four) times daily as needed (pain).     digoxin (LANOXIN) 0.125 MG tablet TAKE (1) TABLET BY MOUTH ONCE DAILY. 30 tablet 10   empagliflozin (JARDIANCE) 10 MG TABS tablet Take 10 mg by mouth daily.     ezetimibe (ZETIA) 10 MG tablet Take 1 tablet (10 mg total) by mouth daily. 30 tablet 3   gabapentin (NEURONTIN) 100 MG capsule Take 100 mg by mouth 2 (two) times daily.     meclizine (ANTIVERT) 25 MG tablet Take 1 tablet (25 mg total) by mouth 3 (three) times daily. 30 tablet 0   metFORMIN (GLUCOPHAGE) 500 MG tablet Take 500 mg by mouth daily with breakfast.     nitroGLYCERIN (NITROSTAT) 0.4 MG SL tablet PLACE ONE (1) TABLET UNDER TONGUE EVERY 5 MINUTES UP TO (3) DOSES AS NEEDED FOR CHEST PAIN. IF NO RELIEF, CONTACT MD. 25 tablet  3   pantoprazole (PROTONIX) 40 MG tablet Take 40 mg by mouth daily.     potassium chloride (KLOR-CON) 10 MEQ tablet TAKE (1) TABLET BY MOUTH ONCE EVERY OTHER DAY. 30 tablet 11   sacubitril-valsartan (ENTRESTO) 24-26 MG Take 1 tablet by mouth 2 (two) times daily. 180 tablet 3   spironolactone (ALDACTONE) 25 MG tablet TAKE  (1/2) TABLET (12.5MG ) BY MOUTH DAILY. 45 tablet 3   torsemide (DEMADEX) 20 MG tablet TAKE (1) TABLET BY MOUTH ONCE EVERY OTHER DAY. 15 tablet 11   No current facility-administered medications for this encounter.   BP 132/80   Pulse 65   Wt 70.5 kg (155 lb 6.4 oz)   SpO2 97%   BMI 25.08 kg/m   Wt Readings from Last 3 Encounters:  03/12/23 70.5 kg (155 lb 6.4 oz)  02/08/23 65.6 kg (144 lb 9.6 oz)  12/09/22 66.2 kg (146 lb)   PHYSICAL EXAM: General:  elderly appearing.  No respiratory difficulty. Walked in with a cane HEENT: normal Neck: supple. JVD ~10 cm. Carotids 2+ bilat; no bruits. No lymphadenopathy or thyromegaly appreciated. Cor: PMI nondisplaced. Regular rate & rhythm. No rubs, gallops or murmurs. Lungs: coarse throughout Abdomen: soft, nontender, nondistended. No hepatosplenomegaly. No bruits or masses. Good bowel sounds. Extremities: no cyanosis, clubbing, rash, +1 BLE edema  Neuro: alert & oriented x 3, cranial nerves grossly intact. moves all 4 extremities w/o difficulty. Affect pleasant.   Device interrogation today: volume slightly elevated (Personally reviewed)    ASSESSMENT & PLAN:  1. Chronic systolic CHF: Ischemic cardiomyopathy, St Jude ICD. Echo in 10/19 with EF 20-25%.  Echo in 5/22 with EF 25%, normal RV.  Echo 1/23 showed EF 25%. R/LHC in 1/23 at [facility] showed no new CAD, preserved CO and low PCWP suggesting over-diuresis. PYP scan obtained and did not suggest TTR amyloidosis. Echo 4/23 showed EF 20-25% with mild LV dilation, no LV thrombus, moderate-severe MR with ERO 0.39 cm^2 by PISA, normal RV, PASP 49. RHC in 5/23 showed RA mean 2, PA 49/12 (31), PCWP mean 21 with prominent v waves to 45 suggesting severe MR, Fick CO/CI 3.81/2.18.  Last echo 2/24 showed EF 20%, severe LV dilation, slow flow at apex w/o frank clot, RV normal, at least moderate MR, IVC normal. NYHA class II-IIIa symptoms somewhat confounded by cough. - Continue digoxin. Check level today took at 8  am (has been 6 hours). - Mildly volume overloaded on exam today. Increase torsemide 20 mg daily for 4 days then go back to 20 mg every other day. BMET/BNP today. - Continue spironolactone 12.5 mg daily, K recently 4.9 will not increase - Continue Jardiance 10 mg daily.  - Beta blocker stopped in 5/23 with borderline low output - Continue Entresto 24/26 bid  - add compression socks 2. Mitral regurgitation: Echo 04/23 showed moderate-severe likely functional MR.  He may benefit from Mitraclip procedure. TEE 5/23 with severe functional MR with restricted posterior leaflet. Elevated PCWP with prominent V waves on RHC in 5/23 suggestive of severe MR.  Patient has been seen by structural heart team and he has had dental extractions.  Workup has been considerably slowed by difficulty with arranging transportation from his group home.  - Has follow up with Dr. Lynnette Caffey in June for mTEER eval 3. CAD:  Extensive prior history of CAD.  He has known total occlusion LCx and PLV.  NSTEMI 10/19 with DES to mid LAD.  Coronary angiography in 9/21 showed stable coronary disease, no intervention was  done.  Admission 1/23 to [facility] [place] concerning for ACS, however LHC showed patent stents, no new stents placed. Warfarin was stopped and continued on clopidogrel. No recent ischemic-type chest pain.  - Continue clopidogrel.   - No statin given history of rhabdomyolysis on statin.  He is now on Repatha and Zetia.  4. Diabetes: Taking SGLT2i. No GU symptoms. 5. Smoking: He has quit.  6. PAD: No claudication, foot ulcers. ABIs in 10/21 were normal.    7. LV thrombus:  Echo 4/23 showed no LV thrombus.  Warfarin was stopped during one of his admissions at [facility]. No LV thrombus on TEE (5/23). Will leave off for now.  8. 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  - As above, off Coumadin and on clopidogrel. 9. ETOH use: No further ETOH use. Congratulated on cessation. 10. NSVT: Now off Coreg with marginal output and soft BP.   - Continue amiodarone at 200 mg daily. LFTs stable 4/24. Needs regular eye exam.  11. Cocaine abuse: He was cocaine positive during 2/24 admission.  Denies use.  12. Right-sided chest pain: Associated with tenderness over right-sided ribs and RUQ pain as well as cough.  This started after a mechanical fall during which he struck his right chest.  - No acute findings. Stable positioning of pacing/ICD device. No right-sided rib fractures visible by chest x-ray - resolved  Follow up in 3 months with Dr. Thressa Sheller AGACNP-BC  03/12/23

## 2023-03-12 NOTE — Research (Signed)
Patient came in for Visit 2 for the ANALOG study. Patient was in study group and bought a record of over 2 pages of signals he sent while in the study. He has completed the study. Patient states he is doing well. Told him to call me in 2 weeks if he has not received his check.      [place] Savi Lastinger, Research Coordinator 03/12/2023

## 2023-03-12 NOTE — Patient Instructions (Signed)
INCREASE Torsemide to 20  mg daily for 4 days then resume normal dose of every other day  Labs today We will only contact you if something comes back abnormal or we need to make some changes. Otherwise no news is good news!  Labs needed in 10-14 days  Your physician recommends that you schedule a follow-up appointment in: 3 months with Dr Shirlee Latch  Do the following things EVERYDAY: Weigh yourself in the morning before breakfast. Write it down and keep it in a log. Take your medicines as prescribed Eat low salt foods--Limit salt (sodium) to 2000 mg per day.  Stay as active as you can everyday Limit all fluids for the day to less than 2 liters  At the [facility], you and your health needs are our priority. As part of our continuing mission to provide you with exceptional heart care, we have created designated Provider Care Teams. These Care Teams include your primary Cardiologist (physician) and Advanced Practice Providers (APPs- Physician Assistants and Nurse Practitioners) who all work together to provide you with the care you need, when you need it.   You may see any of the following providers on your designated Care Team at your next follow up: Dr Arvilla Meres Dr Marca Ancona Dr. Marcos Eke, NP Robbie Lis, [place] [facility] [place], [place] Brynda Peon, NP Karle Plumber, PharmD   Please be sure to bring in all your medications bottles to every appointment.    Thank you for choosing [facility]   If you have any questions or concerns before your next appointment please send [place] a message through [place] or call our office at [phone].    TO LEAVE A MESSAGE FOR THE NURSE SELECT OPTION 2, PLEASE LEAVE A MESSAGE INCLUDING: YOUR NAME DATE OF BIRTH CALL BACK NUMBER REASON FOR CALL**this is important as we prioritize the call backs  YOU WILL RECEIVE A CALL BACK THE SAME DAY AS LONG AS YOU  CALL BEFORE 4:00 PM

## 2023-03-13 ENCOUNTER — Ambulatory Visit: Payer: 59 | Admitting: Internal Medicine

## 2023-03-14 ENCOUNTER — Telehealth (HOSPITAL_COMMUNITY): Payer: Self-pay

## 2023-03-14 NOTE — Telephone Encounter (Addendum)
Spoke with Facility they clarified that he was taking Eliquis and Plavix. Asprin was stopped. Per [place], stop Plavix and restart Asprin. Ordered faxed to facility.  Called patient to clarify medications. As far as patient is aware he is taking Plavix, Asprin and Eliquis. Tried calling the facility but got no answer, to ask about medication regime. Will try again

## 2023-03-15 NOTE — Progress Notes (Signed)
Remote ICD transmission.   

## 2023-03-16 DIAGNOSIS — I2584 Coronary atherosclerosis due to calcified coronary lesion: Secondary | ICD-10-CM | POA: Diagnosis not present

## 2023-03-16 DIAGNOSIS — J9811 Atelectasis: Secondary | ICD-10-CM | POA: Diagnosis not present

## 2023-03-16 DIAGNOSIS — Z86711 Personal history of pulmonary embolism: Secondary | ICD-10-CM | POA: Diagnosis not present

## 2023-03-16 DIAGNOSIS — R0789 Other chest pain: Secondary | ICD-10-CM | POA: Diagnosis not present

## 2023-03-16 DIAGNOSIS — J439 Emphysema, unspecified: Secondary | ICD-10-CM | POA: Diagnosis not present

## 2023-03-16 DIAGNOSIS — Z955 Presence of coronary angioplasty implant and graft: Secondary | ICD-10-CM | POA: Diagnosis not present

## 2023-03-16 DIAGNOSIS — I440 Atrioventricular block, first degree: Secondary | ICD-10-CM | POA: Diagnosis not present

## 2023-03-16 DIAGNOSIS — I2699 Other pulmonary embolism without acute cor pulmonale: Secondary | ICD-10-CM | POA: Diagnosis not present

## 2023-03-16 DIAGNOSIS — R079 Chest pain, unspecified: Secondary | ICD-10-CM | POA: Diagnosis not present

## 2023-03-16 DIAGNOSIS — Z743 Need for continuous supervision: Secondary | ICD-10-CM | POA: Diagnosis not present

## 2023-03-16 DIAGNOSIS — R9431 Abnormal electrocardiogram [ECG] [EKG]: Secondary | ICD-10-CM | POA: Diagnosis not present

## 2023-03-16 DIAGNOSIS — I509 Heart failure, unspecified: Secondary | ICD-10-CM | POA: Diagnosis not present

## 2023-03-16 DIAGNOSIS — J432 Centrilobular emphysema: Secondary | ICD-10-CM | POA: Diagnosis not present

## 2023-03-16 DIAGNOSIS — I517 Cardiomegaly: Secondary | ICD-10-CM | POA: Diagnosis not present

## 2023-03-17 DIAGNOSIS — R079 Chest pain, unspecified: Secondary | ICD-10-CM | POA: Diagnosis not present

## 2023-03-17 DIAGNOSIS — J9811 Atelectasis: Secondary | ICD-10-CM | POA: Diagnosis not present

## 2023-03-17 DIAGNOSIS — I2699 Other pulmonary embolism without acute cor pulmonale: Secondary | ICD-10-CM | POA: Diagnosis not present

## 2023-03-17 DIAGNOSIS — J439 Emphysema, unspecified: Secondary | ICD-10-CM | POA: Diagnosis not present

## 2023-03-18 ENCOUNTER — Ambulatory Visit: Payer: 59 | Attending: Cardiovascular Disease

## 2023-03-18 DIAGNOSIS — Z95810 Presence of automatic (implantable) cardiac defibrillator: Secondary | ICD-10-CM

## 2023-03-18 DIAGNOSIS — I5042 Chronic combined systolic (congestive) and diastolic (congestive) heart failure: Secondary | ICD-10-CM

## 2023-03-20 ENCOUNTER — Inpatient Hospital Stay (HOSPITAL_COMMUNITY): Admit: 2023-03-20 | Payer: 59 | Admitting: Internal Medicine

## 2023-03-20 ENCOUNTER — Encounter (HOSPITAL_COMMUNITY): Payer: Self-pay

## 2023-03-20 SURGERY — MITRAL VALVE REPAIR
Anesthesia: General

## 2023-03-20 NOTE — Progress Notes (Signed)
EPIC Encounter for ICM Monitoring  Patient Name: Daniel Li is a 67 y.o. male Date: 03/20/2023 Primary Care Physican: Ignatius Specking, MD Primary Cardiologist: [facility] Electrophysiologist: [facility] 03/12/2023 Office Weight: 155 lbs                                                  Transmission reviewed.   ED visits 4/28 and 5/19.   CorVue thoracic impedance suggesting fluid levels returned to normal.   Impedance was suggesting possible fluid accumulation from 4/17-5/1 and 5/15-5/18.   Prescribed dosage:  Torsemide 20 mg Take 1 tablet by mouth every other day Potassium 10 mEq take 1 tablet by mouth every other day Spironolactone 25 mg take 0.5 tablet (12.5 mg total) daily   Labs: 03/16/2023 Creatinine 1.12, BUN 10, Potassium 5.2, Sodium 144, GFR 72  03/12/2023 Creatinine 1.21, BUN 13, Potassium 3.7, Sodium 142, GFR >60 02/26/2023 Creatinine 1.10, BUN 11, Potassium 4.1, Sodium 145, GFR 74  02/25/2023 Creatinine 1.21, BUN 11, Potassium 3.8, Sodium 143, GFR 66  02/24/2023 Creatinine 1.18, BUN 13, Potassium 3.7, Sodium 145, GFR 68 02/08/2023 Creatinine 1.13, BUN 10, Potassium 4.9, Sodium 140, Gfr >60 08/21/2022 Creatinine 1.62, BUN 14, Potassium 4.6, Sodium 140, GFR 47 08/10/2022 Creatinine 1.50, BUN 15, Potassium 4.3, Sodium 140, GFR 51 04/05/2022 Creatinine 1.46, BUN 12, Potassium 4.4, Sodium 142, GFR 53 A complete set of results can be found in Results Review.   Recommendations:  No changes   Follow-up plan: ICM clinic phone appointment on 04/08/2023.    91 day device clinic remote transmission 05/13/2023.     EP/Cardiology Office Visits:    06/10/2023 with Dr Shirlee Latch.  04/22/2023 with Dr Lynnette Caffey.   Recall 04/04/2023 with Ulyses Jarred, PA.     Copy of ICM check sent to Dr. Nelly Laurence.     3 month ICM trend: 03/19/2023.    12-14 Month ICM trend:     Karie Soda, RN 03/20/2023 3:03 PM

## 2023-03-22 ENCOUNTER — Encounter (HOSPITAL_COMMUNITY): Payer: 59

## 2023-03-27 ENCOUNTER — Other Ambulatory Visit (HOSPITAL_COMMUNITY): Payer: Self-pay

## 2023-03-27 ENCOUNTER — Telehealth (HOSPITAL_COMMUNITY): Payer: Self-pay

## 2023-03-27 NOTE — Addendum Note (Signed)
Addended by: Linda Hedges on: 03/27/2023 02:02 PM   Modules accepted: Orders

## 2023-03-27 NOTE — Telephone Encounter (Signed)
Facility called and stated they need an order for the Eliquis 5mg  faxed to them at [phone].  It looks like there were changes noted, but not made in med list so I wanted to have the nurse handle.

## 2023-03-27 NOTE — Telephone Encounter (Signed)
error 

## 2023-03-27 NOTE — Telephone Encounter (Signed)
Order faxed in and med changes done

## 2023-03-28 DIAGNOSIS — M25561 Pain in right knee: Secondary | ICD-10-CM | POA: Diagnosis not present

## 2023-03-28 DIAGNOSIS — F17200 Nicotine dependence, unspecified, uncomplicated: Secondary | ICD-10-CM | POA: Diagnosis not present

## 2023-03-29 DIAGNOSIS — R5383 Other fatigue: Secondary | ICD-10-CM | POA: Diagnosis not present

## 2023-03-29 DIAGNOSIS — I10 Essential (primary) hypertension: Secondary | ICD-10-CM | POA: Diagnosis not present

## 2023-03-29 DIAGNOSIS — Z79899 Other long term (current) drug therapy: Secondary | ICD-10-CM | POA: Diagnosis not present

## 2023-03-29 DIAGNOSIS — E7800 Pure hypercholesterolemia, unspecified: Secondary | ICD-10-CM | POA: Diagnosis not present

## 2023-03-29 DIAGNOSIS — F17210 Nicotine dependence, cigarettes, uncomplicated: Secondary | ICD-10-CM | POA: Diagnosis not present

## 2023-03-29 DIAGNOSIS — Z0000 Encounter for general adult medical examination without abnormal findings: Secondary | ICD-10-CM | POA: Diagnosis not present

## 2023-03-29 DIAGNOSIS — Z299 Encounter for prophylactic measures, unspecified: Secondary | ICD-10-CM | POA: Diagnosis not present

## 2023-03-29 DIAGNOSIS — Z7189 Other specified counseling: Secondary | ICD-10-CM | POA: Diagnosis not present

## 2023-04-08 ENCOUNTER — Ambulatory Visit: Payer: 59 | Attending: Cardiovascular Disease

## 2023-04-08 DIAGNOSIS — I5042 Chronic combined systolic (congestive) and diastolic (congestive) heart failure: Secondary | ICD-10-CM

## 2023-04-08 DIAGNOSIS — Z95810 Presence of automatic (implantable) cardiac defibrillator: Secondary | ICD-10-CM | POA: Diagnosis not present

## 2023-04-08 NOTE — Progress Notes (Signed)
EPIC Encounter for ICM Monitoring  Patient Name: Daniel Li is a 67 y.o. male Date: 04/08/2023 Primary Care Physican: Ignatius Specking, MD Primary Cardiologist: [facility] Electrophysiologist: [facility] 08/10/2022 Office Weight: 140 lbs                                                  Transmission fluid levels reviewed.    CorVue thoracic impedance suggesting normal fluid levels.      Prescribed dosage:  Torsemide 20 mg Take 1 tablet by mouth every other day Potassium 10 mEq take 1 tablet by mouth every other day Spironolactone 25 mg take 0.5 tablet (12.5 mg total) daily   Labs: 03/16/2023 Creatinine 1.12, BUN 10, Potassium 5.2, Sodium 144, GFR 72  03/12/2023 Creatinine 1.21, BUN 13, Potassium 3.7, Sodium 142, GFR >60  02/26/2023 Creatinine 1.10, BUN 11, Potassium 4.1, Sodium 145, GFR 74  02/25/2023 Creatinine 1.21, BUN 11, Potassium 3.8, Sodium 145, GFR 66 02/24/2023 Creatinine 1.18, BUN 13, Potassium 3.7, Sodium 145, GFR 68  02/08/2023 Creatinine 1.13, BUN 10, Potassium 4.9, Sodium 140, Gfr >60 08/21/2022 Creatinine 1.62, BUN 14, Potassium 4.6, Sodium 140, GFR 47 08/10/2022 Creatinine 1.50, BUN 15, Potassium 4.3, Sodium 140, GFR 51 04/05/2022 Creatinine 1.46, BUN 12, Potassium 4.4, Sodium 142, GFR 53 A complete set of results can be found in Results Review.   Recommendations:  No changes.    Follow-up plan: ICM clinic phone appointment on 05/14/2023.    91 day device clinic remote transmission 05/13/2023.     EP/Cardiology Office Visits:   06/10/2023 with Dr Shirlee Latch.  04/22/2023 with Dr Lynnette Caffey.   Recall 04/04/2023 with Ulyses Jarred, PA.     Copy of ICM check sent to Dr. Nelly Laurence.     3 month ICM trend: 04/04/2023.    Karie Soda, RN 04/08/2023 7:52 AM

## 2023-04-15 DIAGNOSIS — M25361 Other instability, right knee: Secondary | ICD-10-CM | POA: Diagnosis not present

## 2023-04-15 DIAGNOSIS — M1711 Unilateral primary osteoarthritis, right knee: Secondary | ICD-10-CM | POA: Diagnosis not present
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
## 2023-04-22 ENCOUNTER — Ambulatory Visit: Payer: 59 | Attending: Internal Medicine | Admitting: Internal Medicine

## 2023-04-22 ENCOUNTER — Encounter: Payer: Self-pay | Admitting: Internal Medicine

## 2023-04-22 VITALS — BP 102/70 | HR 79 | Ht 66.5 in | Wt 149.2 lb

## 2023-04-22 DIAGNOSIS — E785 Hyperlipidemia, unspecified: Secondary | ICD-10-CM | POA: Diagnosis not present

## 2023-04-22 DIAGNOSIS — Z95810 Presence of automatic (implantable) cardiac defibrillator: Secondary | ICD-10-CM | POA: Diagnosis not present

## 2023-04-22 DIAGNOSIS — E1169 Type 2 diabetes mellitus with other specified complication: Secondary | ICD-10-CM

## 2023-04-22 DIAGNOSIS — E1159 Type 2 diabetes mellitus with other circulatory complications: Secondary | ICD-10-CM | POA: Diagnosis not present

## 2023-04-22 DIAGNOSIS — I5042 Chronic combined systolic (congestive) and diastolic (congestive) heart failure: Secondary | ICD-10-CM | POA: Diagnosis not present

## 2023-04-22 DIAGNOSIS — Z7984 Long term (current) use of oral hypoglycemic drugs: Secondary | ICD-10-CM

## 2023-04-22 DIAGNOSIS — I2510 Atherosclerotic heart disease of native coronary artery without angina pectoris: Secondary | ICD-10-CM | POA: Diagnosis not present

## 2023-04-22 DIAGNOSIS — I340 Nonrheumatic mitral (valve) insufficiency: Secondary | ICD-10-CM | POA: Diagnosis not present

## 2023-04-22 DIAGNOSIS — I739 Peripheral vascular disease, unspecified: Secondary | ICD-10-CM | POA: Diagnosis not present

## 2023-04-22 NOTE — Patient Instructions (Addendum)
Medication Instructions:  Your physician recommends that you continue on your current medications as directed. Please refer to the Current Medication list given to you today.  *If you need a refill on your cardiac medications before your next appointment, please call your pharmacy*   Testing/Procedures: ECHO - schedule out about 2 weeks  Your physician has requested that you have an echocardiogram. Echocardiography is a painless test that uses sound waves to create images of your heart. It provides your doctor with information about the size and shape of your heart and how well your heart's chambers and valves are working. This procedure takes approximately one hour. There are no restrictions for this procedure. Please do NOT wear cologne, perfume, aftershave, or lotions (deodorant is allowed). Please arrive 15 minutes prior to your appointment time.    Follow-Up: At [facility], you and your health needs are our priority.  As part of our continuing mission to provide you with exceptional heart care, we have created designated Provider Care Teams.  These Care Teams include your primary Cardiologist (physician) and Advanced Practice Providers (APPs -  Physician Assistants and Nurse Practitioners) who all work together to provide you with the care you need, when you need it.  We recommend signing up for the patient portal called "MyChart".  Sign up information is provided on this After Visit Summary.  MyChart is used to connect with patients for Virtual Visits (Telemedicine).  Patients are able to view lab/test results, encounter notes, upcoming appointments, etc.  Non-urgent messages can be sent to your provider as well.   To learn more about what you can do with MyChart, go to [email or web address].    Your next appointment:   To be determined   Provider:   DR Lynnette Caffey

## 2023-04-23 DIAGNOSIS — G8929 Other chronic pain: Secondary | ICD-10-CM | POA: Diagnosis not present

## 2023-04-23 DIAGNOSIS — M25361 Other instability, right knee: Secondary | ICD-10-CM | POA: Diagnosis not present

## 2023-04-23 DIAGNOSIS — M1711 Unilateral primary osteoarthritis, right knee: Secondary | ICD-10-CM | POA: Diagnosis not present

## 2023-04-23 DIAGNOSIS — M25561 Pain in right knee: Secondary | ICD-10-CM | POA: Diagnosis not present

## 2023-05-07 DIAGNOSIS — M25561 Pain in right knee: Secondary | ICD-10-CM | POA: Diagnosis not present

## 2023-05-07 DIAGNOSIS — M25361 Other instability, right knee: Secondary | ICD-10-CM | POA: Diagnosis not present

## 2023-05-07 DIAGNOSIS — M1711 Unilateral primary osteoarthritis, right knee: Secondary | ICD-10-CM | POA: Diagnosis not present

## 2023-05-07 DIAGNOSIS — G8929 Other chronic pain: Secondary | ICD-10-CM | POA: Diagnosis not present

## 2023-05-13 ENCOUNTER — Ambulatory Visit (INDEPENDENT_AMBULATORY_CARE_PROVIDER_SITE_OTHER): Payer: 59

## 2023-05-13 DIAGNOSIS — I5022 Chronic systolic (congestive) heart failure: Secondary | ICD-10-CM

## 2023-05-14 ENCOUNTER — Ambulatory Visit (INDEPENDENT_AMBULATORY_CARE_PROVIDER_SITE_OTHER): Payer: 59

## 2023-05-14 DIAGNOSIS — Z95810 Presence of automatic (implantable) cardiac defibrillator: Secondary | ICD-10-CM

## 2023-05-14 DIAGNOSIS — I5042 Chronic combined systolic (congestive) and diastolic (congestive) heart failure: Secondary | ICD-10-CM | POA: Diagnosis not present

## 2023-05-15 LAB — CUP PACEART REMOTE DEVICE CHECK
Battery Remaining Longevity: 55 mo
Battery Remaining Percentage: 52 %
Battery Voltage: 2.96 V
Brady Statistic RV Percent Paced: 1 %
Date Time Interrogation Session: 20240717015034
HighPow Impedance: 52 Ohm
HighPow Impedance: 52 Ohm
Implantable Lead Connection Status: 753985
Implantable Lead Implant Date: 20181120
Implantable Lead Location: 753860
Implantable Pulse Generator Implant Date: 20181120
Lead Channel Impedance Value: 310 Ohm
Lead Channel Pacing Threshold Amplitude: 0.75 V
Lead Channel Pacing Threshold Pulse Width: 0.5 ms
Lead Channel Sensing Intrinsic Amplitude: 9.4 mV
Lead Channel Setting Pacing Amplitude: 2.5 V
Lead Channel Setting Pacing Pulse Width: 0.5 ms
Lead Channel Setting Sensing Sensitivity: 0.5 mV
Pulse Gen Serial Number: 9786940
Zone Setting Status: 755011

## 2023-05-16 ENCOUNTER — Ambulatory Visit (HOSPITAL_COMMUNITY): Payer: 59 | Attending: Cardiology

## 2023-05-16 DIAGNOSIS — I340 Nonrheumatic mitral (valve) insufficiency: Secondary | ICD-10-CM | POA: Insufficient documentation

## 2023-05-16 DIAGNOSIS — I5042 Chronic combined systolic (congestive) and diastolic (congestive) heart failure: Secondary | ICD-10-CM | POA: Insufficient documentation

## 2023-05-16 LAB — ECHOCARDIOGRAM COMPLETE
Area-P 1/2: 2.7 cm2
Est EF: 20
MV M vel: 4.41 m/s
MV Peak grad: 77.8 mmHg
S' Lateral: 6.1 cm

## 2023-05-16 MED ORDER — PERFLUTREN LIPID MICROSPHERE
1.0000 mL | INTRAVENOUS | Status: AC | PRN
Start: 2023-05-16 — End: 2023-05-16
  Administered 2023-05-16: 3 mL via INTRAVENOUS

## 2023-05-19 NOTE — Progress Notes (Signed)
EPIC Encounter for ICM Monitoring  Patient Name: Daniel Li is a 67 y.o. male Date: 05/19/2023 Primary Care Physican: Ignatius Specking, MD Primary Cardiologist: [facility] Electrophysiologist: [facility] 08/10/2022 Office Weight: 140 lbs                                                  Transmission fluid levels reviewed.    CorVue thoracic impedance suggesting normal fluid levels with the exception of possible fluid accumulation from 7/3-7/11.      Prescribed dosage:  Torsemide 20 mg Take 1 tablet by mouth every other day Potassium 10 mEq take 1 tablet by mouth every other day Spironolactone 25 mg take 0.5 tablet (12.5 mg total) daily   Labs: 03/16/2023 Creatinine 1.12, BUN 10, Potassium 5.2, Sodium 144, GFR 72  03/12/2023 Creatinine 1.21, BUN 13, Potassium 3.7, Sodium 142, GFR >60  02/26/2023 Creatinine 1.10, BUN 11, Potassium 4.1, Sodium 145, GFR 74  02/25/2023 Creatinine 1.21, BUN 11, Potassium 3.8, Sodium 145, GFR 66 02/24/2023 Creatinine 1.18, BUN 13, Potassium 3.7, Sodium 145, GFR 68  02/08/2023 Creatinine 1.13, BUN 10, Potassium 4.9, Sodium 140, Gfr >60 08/21/2022 Creatinine 1.62, BUN 14, Potassium 4.6, Sodium 140, GFR 47 08/10/2022 Creatinine 1.50, BUN 15, Potassium 4.3, Sodium 140, GFR 51 04/05/2022 Creatinine 1.46, BUN 12, Potassium 4.4, Sodium 142, GFR 53 A complete set of results can be found in Results Review.   Recommendations:  No changes.    Follow-up plan: ICM clinic phone appointment on 06/17/2023.    91 day device clinic remote transmission 08/12/2023.     EP/Cardiology Office Visits:   06/10/2023 with Dr Shirlee Latch.    Recall 04/04/2023 with Ulyses Jarred, PA.     Copy of ICM check sent to Dr. Nelly Laurence.     3 month ICM trend: 05/15/2023.    12-14 Month ICM trend:     Karie Soda, RN 05/19/2023 10:02 AM

## 2023-05-24 DIAGNOSIS — F17210 Nicotine dependence, cigarettes, uncomplicated: Secondary | ICD-10-CM | POA: Diagnosis not present

## 2023-05-24 DIAGNOSIS — I152 Hypertension secondary to endocrine disorders: Secondary | ICD-10-CM | POA: Diagnosis not present

## 2023-05-24 DIAGNOSIS — G720 Drug-induced myopathy: Secondary | ICD-10-CM | POA: Diagnosis not present

## 2023-05-24 DIAGNOSIS — I5022 Chronic systolic (congestive) heart failure: Secondary | ICD-10-CM | POA: Diagnosis not present

## 2023-05-24 DIAGNOSIS — E1159 Type 2 diabetes mellitus with other circulatory complications: Secondary | ICD-10-CM | POA: Diagnosis not present

## 2023-05-24 DIAGNOSIS — Z299 Encounter for prophylactic measures, unspecified: Secondary | ICD-10-CM | POA: Diagnosis not present

## 2023-05-24 DIAGNOSIS — I10 Essential (primary) hypertension: Secondary | ICD-10-CM | POA: Diagnosis not present

## 2023-05-24 NOTE — Progress Notes (Signed)
Remote ICD transmission.   

## 2023-05-26 DIAGNOSIS — Z743 Need for continuous supervision: Secondary | ICD-10-CM | POA: Diagnosis not present

## 2023-05-26 DIAGNOSIS — I2510 Atherosclerotic heart disease of native coronary artery without angina pectoris: Secondary | ICD-10-CM | POA: Diagnosis not present

## 2023-05-26 DIAGNOSIS — Z95810 Presence of automatic (implantable) cardiac defibrillator: Secondary | ICD-10-CM | POA: Diagnosis not present

## 2023-05-26 DIAGNOSIS — E119 Type 2 diabetes mellitus without complications: Secondary | ICD-10-CM | POA: Diagnosis not present

## 2023-05-26 DIAGNOSIS — R202 Paresthesia of skin: Secondary | ICD-10-CM | POA: Diagnosis not present

## 2023-05-26 DIAGNOSIS — M25461 Effusion, right knee: Secondary | ICD-10-CM | POA: Diagnosis not present

## 2023-05-26 DIAGNOSIS — M25561 Pain in right knee: Secondary | ICD-10-CM | POA: Diagnosis not present

## 2023-05-26 DIAGNOSIS — R059 Cough, unspecified: Secondary | ICD-10-CM | POA: Diagnosis not present

## 2023-05-26 DIAGNOSIS — Z79899 Other long term (current) drug therapy: Secondary | ICD-10-CM | POA: Diagnosis not present

## 2023-05-26 DIAGNOSIS — Z7984 Long term (current) use of oral hypoglycemic drugs: Secondary | ICD-10-CM | POA: Diagnosis not present

## 2023-05-26 DIAGNOSIS — R079 Chest pain, unspecified: Secondary | ICD-10-CM | POA: Diagnosis not present

## 2023-05-26 DIAGNOSIS — R0789 Other chest pain: Secondary | ICD-10-CM | POA: Diagnosis not present

## 2023-05-26 DIAGNOSIS — R0602 Shortness of breath: Secondary | ICD-10-CM | POA: Diagnosis not present

## 2023-05-26 DIAGNOSIS — Z7982 Long term (current) use of aspirin: Secondary | ICD-10-CM | POA: Diagnosis not present

## 2023-06-10 ENCOUNTER — Ambulatory Visit (HOSPITAL_COMMUNITY)
Admission: RE | Admit: 2023-06-10 | Discharge: 2023-06-10 | Disposition: A | Discharge status: Home / Self Care | Payer: 59 | Source: Ambulatory Visit | Attending: Cardiology | Admitting: Cardiology

## 2023-06-10 ENCOUNTER — Encounter (HOSPITAL_COMMUNITY): Payer: Self-pay | Admitting: Cardiology

## 2023-06-10 VITALS — BP 110/64 | HR 66 | Wt 154.0 lb

## 2023-06-10 DIAGNOSIS — I110 Hypertensive heart disease with heart failure: Secondary | ICD-10-CM | POA: Insufficient documentation

## 2023-06-10 DIAGNOSIS — Z823 Family history of stroke: Secondary | ICD-10-CM | POA: Diagnosis not present

## 2023-06-10 DIAGNOSIS — Z79899 Other long term (current) drug therapy: Secondary | ICD-10-CM | POA: Diagnosis not present

## 2023-06-10 DIAGNOSIS — E7800 Pure hypercholesterolemia, unspecified: Secondary | ICD-10-CM | POA: Diagnosis not present

## 2023-06-10 DIAGNOSIS — I255 Ischemic cardiomyopathy: Secondary | ICD-10-CM | POA: Diagnosis not present

## 2023-06-10 DIAGNOSIS — E785 Hyperlipidemia, unspecified: Secondary | ICD-10-CM | POA: Diagnosis not present

## 2023-06-10 DIAGNOSIS — I2510 Atherosclerotic heart disease of native coronary artery without angina pectoris: Secondary | ICD-10-CM | POA: Diagnosis not present

## 2023-06-10 DIAGNOSIS — F1410 Cocaine abuse, uncomplicated: Secondary | ICD-10-CM | POA: Diagnosis not present

## 2023-06-10 DIAGNOSIS — I252 Old myocardial infarction: Secondary | ICD-10-CM | POA: Insufficient documentation

## 2023-06-10 DIAGNOSIS — I340 Nonrheumatic mitral (valve) insufficiency: Secondary | ICD-10-CM | POA: Diagnosis not present

## 2023-06-10 DIAGNOSIS — I472 Ventricular tachycardia, unspecified: Secondary | ICD-10-CM | POA: Diagnosis not present

## 2023-06-10 DIAGNOSIS — G4733 Obstructive sleep apnea (adult) (pediatric): Secondary | ICD-10-CM | POA: Insufficient documentation

## 2023-06-10 DIAGNOSIS — Z7984 Long term (current) use of oral hypoglycemic drugs: Secondary | ICD-10-CM | POA: Diagnosis not present

## 2023-06-10 DIAGNOSIS — E119 Type 2 diabetes mellitus without complications: Secondary | ICD-10-CM | POA: Insufficient documentation

## 2023-06-10 DIAGNOSIS — I5022 Chronic systolic (congestive) heart failure: Secondary | ICD-10-CM | POA: Diagnosis not present

## 2023-06-10 DIAGNOSIS — Z8673 Personal history of transient ischemic attack (TIA), and cerebral infarction without residual deficits: Secondary | ICD-10-CM | POA: Insufficient documentation

## 2023-06-10 DIAGNOSIS — F17200 Nicotine dependence, unspecified, uncomplicated: Secondary | ICD-10-CM | POA: Diagnosis not present

## 2023-06-10 LAB — COMPREHENSIVE METABOLIC PANEL
ALT: 21 U/L (ref 0–44)
AST: 32 U/L (ref 15–41)
Albumin: 3.8 g/dL (ref 3.5–5.0)
Alkaline Phosphatase: 50 U/L (ref 38–126)
Anion gap: 11 (ref 5–15)
BUN: 10 mg/dL (ref 8–23)
CO2: 27 mmol/L (ref 22–32)
Calcium: 9.3 mg/dL (ref 8.9–10.3)
Chloride: 99 mmol/L (ref 98–111)
Creatinine, Ser: 1.19 mg/dL (ref 0.61–1.24)
GFR, Estimated: >60 mL/min (ref >60)
Glucose, Bld: 90 mg/dL (ref 70–99)
Potassium: 3.7 mmol/L (ref 3.5–5.1)
Sodium: 137 mmol/L (ref 135–145)
Total Bilirubin: 0.6 mg/dL (ref 0.3–1.2)
Total Protein: 6.8 g/dL (ref 6.5–8.1)

## 2023-06-10 LAB — CBC
HCT: 44 % (ref 39.0–52.0)
Hemoglobin: 13.9 g/dL (ref 13.0–17.0)
MCH: 25.3 pg — ABNORMAL LOW (ref 26.0–34.0)
MCHC: 31.6 g/dL (ref 30.0–36.0)
MCV: 80 fL (ref 80.0–100.0)
Platelets: 235 10*3/uL (ref 150–400)
RBC: 5.5 MIL/uL (ref 4.22–5.81)
RDW: 17.4 % — ABNORMAL HIGH (ref 11.5–15.5)
WBC: 7.1 10*3/uL (ref 4.0–10.5)
nRBC: 0 % (ref 0.0–0.2)

## 2023-06-10 LAB — BRAIN NATRIURETIC PEPTIDE: B Natriuretic Peptide: 151.2 pg/mL — ABNORMAL HIGH (ref 0.0–100.0)

## 2023-06-10 LAB — LIPID PANEL
Cholesterol: 238 mg/dL — ABNORMAL HIGH (ref 0–200)
HDL: 72 mg/dL (ref >40)
LDL Cholesterol: 144 mg/dL — ABNORMAL HIGH (ref 0–99)
Total CHOL/HDL Ratio: 3.3 RATIO
Triglycerides: 111 mg/dL (ref <150)
VLDL: 22 mg/dL (ref 0–40)

## 2023-06-10 LAB — TSH: TSH: 2.347 u[IU]/mL (ref 0.350–4.500)

## 2023-06-10 MED ORDER — SPIRONOLACTONE 25 MG PO TABS: 25.0000 mg | ORAL_TABLET | Freq: Every day | ORAL | 3 refills | Status: DC

## 2023-06-10 NOTE — Patient Instructions (Addendum)
Medication Changes:  Stop Aspirin  Stop Potassium  Increase Spironolactone to 25 mg (1 tab) Daily  Lab Work:  Labs done today, your results will be available in MyChart, we will contact you for abnormal readings.  Your physician recommends that you return for lab work in: 1-2 weeks, we have provided you with a prescription to have this done locally  Testing/Procedures:  none  Referrals:  You have been referred to follow-up with Dr Lynnette Caffey on Fri 06/14/23 at 1:30 PM  Special Instructions // Education:  Do the following things EVERYDAY: Weigh yourself in the morning before breakfast. Write it down and keep it in a log. Take your medicines as prescribed Eat low salt foods--Limit salt (sodium) to 2000 mg per day.  Stay as active as you can everyday Limit all fluids for the day to less than 2 liters   Follow-Up in: 2 months  At the [facility], you and your health needs are our priority. We have a designated team specialized in the treatment of Heart Failure. This Care Team includes your primary Heart Failure Specialized Cardiologist (physician), Advanced Practice Providers (APPs- Physician Assistants and Nurse Practitioners), and Pharmacist who all work together to provide you with the care you need, when you need it.   You may see any of the following providers on your designated Care Team at your next follow up:  Dr. Arvilla Meres Dr. Marca Ancona Dr. Marcos Eke, NP Robbie Lis, [place] [facility] [place], [place] Brynda Peon, NP Karle Plumber, PharmD   Please be sure to bring in all your medications bottles to every appointment.   Need to Contact us:  If you have any questions or concerns before your next appointment please send [place] a message through [place] or call our office at [phone].    TO LEAVE A MESSAGE FOR THE NURSE SELECT OPTION 2, PLEASE LEAVE A MESSAGE INCLUDING: YOUR NAME DATE OF BIRTH CALL BACK  NUMBER REASON FOR CALL**this is important as we prioritize the call backs  YOU WILL RECEIVE A CALL BACK THE SAME DAY AS LONG AS YOU CALL BEFORE 4:00 PM

## 2023-06-11 ENCOUNTER — Telehealth (HOSPITAL_COMMUNITY): Payer: Self-pay

## 2023-06-11 ENCOUNTER — Telehealth: Payer: Self-pay

## 2023-06-11 DIAGNOSIS — E7800 Pure hypercholesterolemia, unspecified: Secondary | ICD-10-CM

## 2023-06-11 NOTE — Telephone Encounter (Signed)
Spoke with Dr. Shirlee Latch and patient in person at his CHF visit yesterday.  Scheduled the patient for follow-up 8/16 with Dr. Lynnette Caffey for final mTEER discussion.  Confirmed with his group home they have transportation to this visit.

## 2023-06-11 NOTE — Telephone Encounter (Signed)
Patients caregiver advised, she states patient has not been taking the repatha due to them D/C it in the hospital. Referral placed.   Orders Placed This Encounter  Procedures   AMB Referral to [facility]    Referral Priority:   Routine    Referral Type:   Consultation    Referral Reason:   Specialty Services Required    Number of Visits Requested:   1

## 2023-06-11 NOTE — Progress Notes (Signed)
ADVANCED HF CLINIC NOTE   PCP: Ignatius Specking, MD Primary Cardiologist: Dr. Wyline Mood HF: Dr. Shirlee Latch  HPI: Daniel Li is a 67 y.o. male with long history of CAD s/p multiple ACS episodes, HTN, HLD, tobacco abuse, DM, OSA, CVA and systolic CHF.    He has a history of CAD with NSTEMI in 2007 with BMS placement to his D1 and OM2. Also with history of STEMI in May 2013 at which time he had a BMS placed to his RCA. NSTEMI in January of 2014, cath showed CTO of left circumflex, unable to intervene upon. EF 55-60% in September 2016. Anterior STEMI 4/18, DES to pLAD.    Admitted 8/9 - 06/11/17 with chest pain. No targets for PCI, LAD stent patent. EF remained low at 20-25% as below. Now with St Jude ICD.    Admitted 4/16 - 02/17/2018 with NSTEMI. Underwent LHC again with unchanged anatomy, no intervention. Discharged back to assisted living.    He was admitted again in 10/19 with NSTEMI.  LHC showed totally occluded PLV, 75% PDA stenosis, 80% mid LAD, occluded LCx.  He had DES to mLAD. Repeat echo showed EF 20-25%.  RHC/LHC in 9/21 showed unchanged coronaries with occluded PLV and proximal LCx with collaterals, patent LAD, 60% D2, 60% PDA. Normal filling pressures and relatively preserved cardiac output.    Echo in 5/22 showed EF 25% with mild LV dilation, no LV thrombus, normal RV, mild MR.    Admitted 11/05/21 at [facility] for hypotension and AKI. Echo ordered but not completed. Received IVF and beta blocker and losartan stopped at discharge.    Admitted 11/20/21 to [facility] with CP, concern for ACS. R/LHC with 3-vessel disease with patent stents (no intervention) CO 3.0 and low PWCP at 7 mmHg. Echo showed EF~25%, no evidence of thrombus, warfarin stopped and he was transitioned to Plavix. Dig, losartan and spiro stopped due to AKI. PYP scan obtained and showed no evidence of TTR amyloidosis.    Admitted to [facility] 2/23 with CP and fall. Ethanol on admission 184. Troponin elevated but felt to be in setting of  ETOH use and fall.    Echo 4/23: EF 20-25% with mild LV dilation, no LV thrombus, moderate-severe MR with ERO 0.39 cm^2 by PISA, normal RV, PASP 49.    Presented for TEE and RHC on 03/22/22 as part of workup for consideration of MitraClip. TEE with evidence of moderate to severe MR. RHC with elevated PCWP and prominent v-waves suggestive of severe MR, normal RA pressure, pulmonary venous HTN, low CO. He was direct admitted for overnight observation, given he has no one at home to stay with him post procedure. While admitted, structural heart team started assessment for MitraClip. Given marginal CO on cath and soft BP, Coreg was discontinued. Digoxin 0.125 mg added. He was noted to have 15 beat run of NSVT on tele. He was started on amiodarone.   He was admitted in 2/24 with right-sided chest pain, workup was negative.  UDS was + for cocaine though he denied use. Echo in 2/24 showed EF 20%, severe LV dilation, slow flow at apex w/o frank clot, RV normal, at least moderate MR, IVC normal.   In 5/24, he was diagnosed with PE at [facility] and started on apixaban.   Echo in 7/24 showed EF 20%, slow flow at apex, severe LV dilation, mild RV dysfunction, mod-severe MR, IVC normal.   Patient has been seen by Dr. Lynnette Caffey with plan for Mitraclip.  He  has had dental evaluation and tooth removal that needed to be done pre-Mitraclip.  Evaluation has been very delayed due to extreme difficulty getting transportation to appointments from his group home.   He returns today for followup of CHF.  Asking when his Mitraclip procedure will be.  Weight down 1 lb.  Occasionally gets lightheaded though SBP generally around 120 when checked at his group home.  Occasional right-sided chest pain, no trigger.  No dyspnea walking around house.  Gets short of breath walking up stairs or inclines or walking in the heat.  No orthopnea/PND.  No cocaine use, rare cigarette smoking.   ECG (personally reviewed): NSR, nonspecific  ST changes  Labs (6/23): K 4.4, creatinine 1.46, LFTs normal, TSH normal, Lp (a) 232 Labs (2/24): LDL 45, BNP 171, K 4.3, creatinine 1.33 Labs (4/24): K 4.1, SCr 1.1 Labs (5/24): K 5.2, creatinine 1.12 Labs (7/24): K 4.7, creatininine 1.31  St Jude device interrogation: no VT, stable thoracic impedance.  PMH: 1. OSA: Supposed to be using CPAP.  2. HTN 3. CAD: Long history.  - NSTEMI 2007 with BMS D1 and OM2. - STEMI 5/13 with BMS RCA - NSTEMI 1/14 with CTO LCx and PLV, unable to open LCx.  - STEMI 4/18 with LHC showing old TO LCx with collaterals, old TO PLV, 70% ostial D1, totally occluded proximal LAD with some collaterals => DES to LAD.  - LHC 8/18 with old TO LCx, old TO PLV, 60% mLAD, patent LAD stent.  - LHC 4/19 with totally occluded PLV, totally occluded LCx, patent LAD and D1 stents, 50% mid LAD.  - NSTEMI 10/19. LHC with totally occluded PLV, totally occluded LCx, patent proximal LAD and D1 stents, 80% mid LAD stenosis => DES to mid LAD.  - LHC (9/21): occluded PLV and proximal LCx with collaterals, patent LAD, 60% D2, 60% PDA.  No intervention.  - LHC (1/23, [facility]): significant 3-vessel disease with patent stents; no further stents were placed 4. Depression 5. Type II DM 6. Hyperlipidemia: History of rhabdomyolysis with statins, he is on Zetia.  7. Chronic systolic CHF: Ischemic cardiomyopathy.  Echo from 9/16 showed improvement in EF to 55-60%.  St Jude ICD.  - Echo (4/18) with EF 20-25%, dyskinetic apex, moderate-severe MR, severe LAE.  - Echo (8/18) with EF 20-25%, moderate to severe MR - Echo (4/19) with EF 20-25%  - Echo (10/19) with EF 20-25%, no LV thrombus.  - RHC (10/19): mean RA 4, PA 22/7, mean PCWP 7, CI 2.3 - RHC (9/21): mean RA 1, PA 18/5, mean PCWP 7, CI 2.12 (F)/2.17(T) - Echo (5/22): EF 25% with mild LV dilation, no LV thrombus, normal RV, mild MR. - RHC (1/23): CO 3.0 and low PWCP at 7 mmHg - Echo (1/23): EF 25% - Echo (4/23): EF 20-25% with mild LV  dilation, no LV thrombus, moderate-severe MR with ERO 0.39 cm^2 by PISA, normal RV, PASP 49.  - Echo (2/24): EF 20%, severe LV dilation, slow flow at apex w/o frank clot, RV normal, at least moderate MR, IVC normal.  - Echo (7/24): EF 20%, slow flow at apex, severe LV dilation, mild RV dysfunction, mod-severe MR, IVC normal.  8. PAD: - ABIs (4/18) with R ABI of 0.84 suggestive of mild disease.  L ABI of 1.27 (Normal flow at rest) - ABIs (5/19): abnormal TBI on right.  - ABIs (10/21): Normal 9. LV thrombus 10. H/o CVA 11. Mitral regurgitation: Likely functional. Echo in 4/23 with moderate-severe MR. EE (5/23):  EF 20-25%, RV mildly reduced, no LAA, severe MR with no mitral stenosis, MV mean gradient 2.0 mmHg - TEE (5/23): EF 20-25%, mild RV dysfunction, severe MR.  - Echo (7/24): moderate-severe MR 12. PE: Diagnosed [facility] in 5/24.   ROS: All systems negative except as listed in HPI, PMH and Problem List.  SH:  Social History   Socioeconomic History   Marital status: Single    Spouse name: Not on file   Number of children: 0   Years of education: 9   Highest education level: 9th grade  Occupational History   Not on file  Tobacco Use   Smoking status: Some Days    Current packs/day: 0.50    Average packs/day: 0.5 packs/day for 45.9 years (22.9 ttl pk-yrs)    Types: Cigarettes, Cigars    Start date: 07/21/1977   Smokeless tobacco: Never  Vaping Use   Vaping status: Never Used  Substance and Sexual Activity   Alcohol use: Yes    Comment: 09/17/2017 "nothing since 05/2017"   Drug use: Not Currently    Comment: previously incarcerated for drug related offense.   Sexual activity: Not Currently  Other Topics Concern   Not on file  Social History Narrative   Not on file   Social Determinants of Health   Financial Resource Strain: Low Risk  (02/25/2023)   Received from [facility], [facility]   Overall Financial Resource Strain (CARDIA)    Difficulty of Paying  Living Expenses: Not hard at all  Food Insecurity: No Food Insecurity (02/25/2023)   Received from [facility], [facility]   Hunger Vital Sign    Worried About Running Out of Food in the Last Year: Never true    Ran Out of Food in the Last Year: Never true  Transportation Needs: No Transportation Needs (02/25/2023)   Received from [facility], [facility]   [facility] - Transportation    Lack of Transportation (Medical): No    Lack of Transportation (Non-Medical): No  Recent Concern: Transportation Needs - Unmet Transportation Needs (02/06/2023)   PRAPARE - Administrator, Civil Service (Medical): Yes    Lack of Transportation (Non-Medical): Yes  Physical Activity: Not on file  Stress: Not on file  Social Connections: Not on file  Intimate Partner Violence: Not At Risk (12/10/2022)   Humiliation, Afraid, Rape, and Kick questionnaire    Fear of Current or Ex-Partner: No    Emotionally Abused: No    Physically Abused: No    Sexually Abused: No   FH:  Family History  Problem Relation Age of Onset   Stroke Mother    Heart attack Mother    Heart attack Father    Stroke Sister    Heart attack Sister    Heart attack Brother    Stroke Brother    Liver disease Neg Hx    Colon cancer Neg Hx    Current Outpatient Medications  Medication Sig Dispense Refill   acetaminophen (TYLENOL) 325 MG tablet Take 650 mg by mouth every 6 (six) hours as needed for mild pain, fever or headache.      amiodarone (PACERONE) 200 MG tablet TAKE (1) TABLET BY MOUTH ONCE DAILY. 90 tablet 3   apixaban (ELIQUIS) 5 MG TABS tablet Take 5 mg by mouth 2 (two) times daily.     digoxin (LANOXIN) 0.125 MG tablet TAKE (1) TABLET BY MOUTH ONCE DAILY. 30 tablet 10   empagliflozin (JARDIANCE)  10 MG TABS tablet Take 10 mg by mouth daily.     ezetimibe (ZETIA) 10 MG tablet Take 1 tablet (10 mg total) by mouth daily. 30 tablet 3   gabapentin (NEURONTIN) 100 MG capsule Take 100 mg by mouth 2 (two)  times daily.     meclizine (ANTIVERT) 25 MG tablet Take 1 tablet (25 mg total) by mouth 3 (three) times daily. 30 tablet 0   metFORMIN (GLUCOPHAGE) 500 MG tablet Take 500 mg by mouth daily with breakfast.     nitroGLYCERIN (NITROSTAT) 0.4 MG SL tablet PLACE ONE (1) TABLET UNDER TONGUE EVERY 5 MINUTES UP TO (3) DOSES AS NEEDED FOR CHEST PAIN. IF NO RELIEF, CONTACT MD. 25 tablet 3   pantoprazole (PROTONIX) 40 MG tablet Take 40 mg by mouth daily.     sacubitril-valsartan (ENTRESTO) 24-26 MG Take 1 tablet by mouth 2 (two) times daily. 180 tablet 3   torsemide (DEMADEX) 20 MG tablet TAKE (1) TABLET BY MOUTH ONCE EVERY OTHER DAY. 15 tablet 11   traMADol (ULTRAM) 50 MG tablet Take 50 mg by mouth every 8 (eight) hours as needed.     cyclobenzaprine (FLEXERIL) 10 MG tablet Take 1 tablet (10 mg total) by mouth 3 (three) times daily. (Patient not taking: Reported on 04/22/2023) 30 tablet 0   diclofenac Sodium (VOLTAREN) 1 % GEL Apply 2 g topically 4 (four) times daily as needed (pain).     spironolactone (ALDACTONE) 25 MG tablet Take 1 tablet (25 mg total) by mouth daily. 90 tablet 3   No current facility-administered medications for this encounter.   BP 110/64   Pulse 66   Wt 69.9 kg (154 lb)   SpO2 97%   BMI 24.48 kg/m   Wt Readings from Last 3 Encounters:  06/10/23 69.9 kg (154 lb)  04/22/23 67.7 kg (149 lb 3.2 oz)  03/12/23 70.5 kg (155 lb 6.4 oz)   PHYSICAL EXAM: General: NAD Neck: No JVD, no thyromegaly or thyroid nodule.  Lungs: Clear to auscultation bilaterally with normal respiratory effort. CV: Nondisplaced PMI.  Heart regular S1/S2, no S3/S4, 2/6 HSM apex.  No peripheral edema.  No carotid bruit.  Normal pedal pulses.  Abdomen: Soft, nontender, no hepatosplenomegaly, no distention.  Skin: Intact without lesions or rashes.  Neurologic: Alert and oriented x 3.  Psych: Normal affect. Extremities: No clubbing or cyanosis.  HEENT: Normal.   ASSESSMENT & PLAN:  1. Chronic systolic  CHF: Ischemic cardiomyopathy, St Jude ICD. Echo in 10/19 with EF 20-25%.  Echo in 5/22 with EF 25%, normal RV.  Echo 1/23 showed EF 25%. R/LHC in 1/23 at [facility] showed no new CAD, preserved CO and low PCWP suggesting over-diuresis. PYP scan obtained and did not suggest TTR amyloidosis. Echo 4/23 showed EF 20-25% with mild LV dilation, no LV thrombus, moderate-severe MR with ERO 0.39 cm^2 by PISA, normal RV, PASP 49. RHC in 5/23 showed RA mean 2, PA 49/12 (31), PCWP mean 21 with prominent v waves to 45 suggesting severe MR, Fick CO/CI 3.81/2.18.  Last echo 7/24 showed EF 20%, slow flow at apex, severe LV dilation, mild RV dysfunction, mod-severe MR, IVC normal.  NYHA class III symptoms, not significantly volume overloaded by exam or Corvue.  - Continue digoxin. Check level today. - Continue torsemide 20 mg every other day.   - Increase spironolactone to 25 mg daily and stop KCl supplement.  BMET/BNP today, BMET 10 days.  - Continue Jardiance 10 mg daily.  - Beta blocker stopped in  5/23 with borderline low output - Continue Entresto 24/26 bid  2. Mitral regurgitation: Echo 04/23 showed moderate-severe likely functional MR.  He may benefit from Mitraclip procedure. TEE 5/23 with severe functional MR with restricted posterior leaflet. Elevated PCWP with prominent V waves on RHC in 5/23 suggestive of severe MR.  Patient has been seen by structural heart team and he has had dental extractions.  Workup has been considerably slowed by difficulty with arranging transportation from his group home. Most recent echo in 7/24 with mod-severe MR.  - Seen by structural heart nurse today, will be setting up appt with Dr. Lynnette Caffey to arrange mTEER.  3. CAD:  Extensive prior history of CAD.  He has known total occlusion LCx and PLV.  NSTEMI 10/19 with DES to mid LAD.  Coronary angiography in 9/21 showed stable coronary disease, no intervention was done.  Admission 1/23 to [facility] concerning for ACS, however LHC showed patent stents,  no new stents placed. No recent ischemic-type chest pain.  - Given apixaban use and stable CAD, stop ASA.    - No statin given history of rhabdomyolysis on statin.  He is now on Repatha and Zetia. Check lipids today.  4. Diabetes: Taking SGLT2i.  5. Smoking: Very rare now.  6. PAD: No claudication, foot ulcers. ABIs in 10/21 were normal.    7. LV thrombus:  Echo 4/23 showed no LV thrombus.  Warfarin was stopped during one of his admissions at [facility]. No LV thrombus on TEE (5/23). He was started on apixaban in 5/24 due to PE.  8. 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  - He is on apixaban.  9. ETOH use: No further ETOH use. Congratulated on cessation. 10. NSVT: Now off Coreg with marginal output and soft BP.  - Continue amiodarone at 200 mg daily. Check LFTs and TSH. Needs regular eye exam.  11. Cocaine abuse: He was cocaine positive during 2/24 admission.  Denies current use.  12. PE: Diagnosed in 5/24, on apixaban (continue long-term).   Followup 2 months with APP.   Marca Ancona 06/11/23

## 2023-06-11 NOTE — Telephone Encounter (Signed)
-----   Message from Marca Ancona sent at 06/11/2023 12:12 AM EDT ----- LDL is way too high.  Goal < 55.  He should be taking Zetia and Repatha.  Suspect he may be off Repatha. If so, refer to lipid clinic to get restarted.

## 2023-06-14 ENCOUNTER — Encounter: Payer: Self-pay | Admitting: Internal Medicine

## 2023-06-14 ENCOUNTER — Ambulatory Visit: Payer: 59 | Attending: Internal Medicine | Admitting: Internal Medicine

## 2023-06-14 ENCOUNTER — Other Ambulatory Visit: Payer: Self-pay

## 2023-06-14 VITALS — BP 108/66 | HR 69 | Ht 66.5 in | Wt 155.4 lb

## 2023-06-14 DIAGNOSIS — I5022 Chronic systolic (congestive) heart failure: Secondary | ICD-10-CM

## 2023-06-14 DIAGNOSIS — I340 Nonrheumatic mitral (valve) insufficiency: Secondary | ICD-10-CM

## 2023-06-14 NOTE — Patient Instructions (Signed)
Medication Instructions:  ?No changes today. ?*If you need a refill on your cardiac medications before your next appointment, please call your pharmacy* ? ? ?Lab Work: ?none ?If you have labs (blood work) drawn today and your tests are completely normal, you will receive your results only by: ?MyChart Message (if you have MyChart) OR ?A paper copy in the mail ?If you have any lab test that is abnormal or we need to change your treatment, we will call you to review the results. ? ? ?Testing/Procedures: ?none ? ? ?Follow-Up: ?Per Structural Heart Team ? ?
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
## 2023-06-16 ENCOUNTER — Other Ambulatory Visit: Payer: Self-pay

## 2023-06-16 ENCOUNTER — Emergency Department (HOSPITAL_COMMUNITY): Payer: 59

## 2023-06-16 ENCOUNTER — Encounter (HOSPITAL_COMMUNITY): Payer: Self-pay

## 2023-06-16 ENCOUNTER — Emergency Department (HOSPITAL_COMMUNITY)
Admission: EM | Admit: 2023-06-16 | Discharge: 2023-06-17 | Disposition: A | Discharge status: Home / Self Care | Payer: 59 | Attending: Emergency Medicine | Admitting: Emergency Medicine

## 2023-06-16 DIAGNOSIS — I110 Hypertensive heart disease with heart failure: Secondary | ICD-10-CM | POA: Insufficient documentation

## 2023-06-16 DIAGNOSIS — I509 Heart failure, unspecified: Secondary | ICD-10-CM | POA: Diagnosis not present

## 2023-06-16 DIAGNOSIS — Z79899 Other long term (current) drug therapy: Secondary | ICD-10-CM | POA: Insufficient documentation

## 2023-06-16 DIAGNOSIS — J189 Pneumonia, unspecified organism: Secondary | ICD-10-CM

## 2023-06-16 DIAGNOSIS — Z7901 Long term (current) use of anticoagulants: Secondary | ICD-10-CM | POA: Diagnosis not present

## 2023-06-16 DIAGNOSIS — I2510 Atherosclerotic heart disease of native coronary artery without angina pectoris: Secondary | ICD-10-CM | POA: Diagnosis not present

## 2023-06-16 DIAGNOSIS — Z1152 Encounter for screening for COVID-19: Secondary | ICD-10-CM | POA: Diagnosis not present

## 2023-06-16 DIAGNOSIS — R059 Cough, unspecified: Secondary | ICD-10-CM | POA: Diagnosis not present

## 2023-06-16 DIAGNOSIS — J181 Lobar pneumonia, unspecified organism: Secondary | ICD-10-CM | POA: Insufficient documentation

## 2023-06-16 DIAGNOSIS — Z950 Presence of cardiac pacemaker: Secondary | ICD-10-CM | POA: Diagnosis not present

## 2023-06-16 DIAGNOSIS — Z743 Need for continuous supervision: Secondary | ICD-10-CM | POA: Diagnosis not present

## 2023-06-16 DIAGNOSIS — I499 Cardiac arrhythmia, unspecified: Secondary | ICD-10-CM | POA: Diagnosis not present

## 2023-06-16 DIAGNOSIS — R58 Hemorrhage, not elsewhere classified: Secondary | ICD-10-CM | POA: Diagnosis not present

## 2023-06-16 DIAGNOSIS — R0789 Other chest pain: Secondary | ICD-10-CM | POA: Diagnosis not present

## 2023-06-16 DIAGNOSIS — R042 Hemoptysis: Secondary | ICD-10-CM | POA: Diagnosis not present

## 2023-06-16 DIAGNOSIS — R918 Other nonspecific abnormal finding of lung field: Secondary | ICD-10-CM | POA: Diagnosis not present

## 2023-06-16 DIAGNOSIS — I517 Cardiomegaly: Secondary | ICD-10-CM | POA: Diagnosis not present

## 2023-06-16 DIAGNOSIS — I447 Left bundle-branch block, unspecified: Secondary | ICD-10-CM | POA: Diagnosis not present

## 2023-06-16 NOTE — ED Provider Notes (Signed)
[place] EMERGENCY DEPARTMENT AT [facility] Provider Note   CSN: [id number] Arrival date & time: 06/16/23  2213     History {Add pertinent medical, surgical, social history, OB history to HPI:1} Chief Complaint  Patient presents with   coughing up blood    Daniel Li is a 67 y.o. male.  HPI   Patient with medical history including CHF EF of 20%, mitral valve regurg, CAD, status post ICD, hypertension, currently on Eliquis presenting with complaints of hemoptysis.  Patient states that started today, states that he has sputum with with a streak of blood in it.  He has he has been having a slight cough, he denies any worsening shortness of breath, no active chest pain, denies any pleuritic chest pain, denies any stomach pains nausea vomiting diarrhea, denies any bloody stools or dark tarry stools, states he been compliant all of his medications, he denies any worsening peripheral edema, states he associates chills at fevers, no general body aches.  Reviewed patient's chart extensive medical history, currently has severe heart failure with an EF of 20%, patient scheduled for mitral valve repair later this month.  Home Medications Prior to Admission medications   Medication Sig Start Date End Date Taking? Authorizing Provider  acetaminophen (TYLENOL) 325 MG tablet Take 650 mg by mouth every 6 (six) hours as needed for mild pain, fever or headache.     [provider]  amiodarone (PACERONE) 200 MG tablet TAKE (1) TABLET BY MOUTH ONCE DAILY. 07/11/22   Andrey Farmer, PA-C  apixaban (ELIQUIS) 5 MG TABS tablet Take 5 mg by mouth 2 (two) times daily.    [provider]  aspirin EC 81 MG tablet Take 81 mg by mouth daily. Swallow whole.    [provider]  digoxin (LANOXIN) 0.125 MG tablet TAKE (1) TABLET BY MOUTH ONCE DAILY. 10/04/22   Laurey Morale, MD  empagliflozin (JARDIANCE) 10 MG TABS tablet Take 10 mg by mouth daily.    [provider]  ezetimibe (ZETIA) 10 MG tablet Take 1 tablet (10 mg total) by mouth daily. 03/27/17   Antoine Poche, MD  gabapentin (NEURONTIN) 100 MG capsule Take 100 mg by mouth 2 (two) times daily.    [provider]  lidocaine (LIDODERM) 5 % Place 1 patch onto the skin every 12 (twelve) hours. 05/27/23   [provider]  meclizine (ANTIVERT) 25 MG tablet Take 1 tablet (25 mg total) by mouth 3 (three) times daily. 12/10/22   Rolly Salter, MD  metFORMIN (GLUCOPHAGE) 500 MG tablet Take 500 mg by mouth daily with breakfast.    [provider]  nitroGLYCERIN (NITROSTAT) 0.4 MG SL tablet PLACE ONE (1) TABLET UNDER TONGUE EVERY 5 MINUTES UP TO (3) DOSES AS NEEDED FOR CHEST PAIN. IF NO RELIEF, CONTACT MD. 09/29/19   Antoine Poche, MD  pantoprazole (PROTONIX) 40 MG tablet Take 40 mg by mouth daily.    [provider]  potassium chloride (KLOR-CON) 10 MEQ tablet Take 10 mEq by mouth every other day.    [provider]  sacubitril-valsartan (ENTRESTO) 24-26 MG Take 1 tablet by mouth 2 (two) times daily. 02/08/23   Laurey Morale, MD  spironolactone (ALDACTONE) 25 MG tablet Take 1 tablet (25 mg total) by mouth daily. 06/10/23   Laurey Morale, MD  torsemide (DEMADEX) 20 MG tablet TAKE (1) TABLET BY MOUTH ONCE EVERY OTHER DAY. Patient taking differently: 1 tab if  3 pound increase  in weight 01/31/23   [place], Anderson Malta, FNP  traMADol (ULTRAM) 50 MG tablet Take 50 mg by mouth every 8 (eight) hours as needed. 05/27/23   [provider]      Allergies    Contrast media [iodinated contrast media] and Pork-derived products    Review of Systems   Review of Systems  Constitutional:  Negative for chills and fever.  Respiratory:  Positive for cough. Negative for shortness of breath.   Cardiovascular:  Negative for chest pain.  Gastrointestinal:  Negative for abdominal pain.  Neurological:  Negative for headaches.    Physical Exam Updated Vital  Signs BP 111/75   Pulse 68   Temp 98 F (36.7 C)   Resp (!) 22   Ht 5' 6.5" (1.689 m)   Wt 70.5 kg   SpO2 99%   BMI 24.71 kg/m  Physical Exam Vitals and nursing note reviewed.  Constitutional:      General: He is not in acute distress.    Appearance: He is not ill-appearing.  HENT:     Head: Normocephalic and atraumatic.     Nose: No congestion.  Eyes:     Conjunctiva/sclera: Conjunctivae normal.  Neck:     Comments: No JVD present. Cardiovascular:     Rate and Rhythm: Normal rate and regular rhythm.     Pulses: Normal pulses.     Heart sounds: No murmur heard.    No friction rub. No gallop.  Pulmonary:     Effort: No respiratory distress.     Breath sounds: Rales present. No wheezing or rhonchi.     Comments: No evidence of respiratory distress nontachypneic nonhypoxic no assessor muscle usage, patient has bilateral Rales in the lower lobes, worse in the left versus the right, no rhonchi or stridor present. Abdominal:     Palpations: Abdomen is soft.     Tenderness: There is no abdominal tenderness. There is no right CVA tenderness or left CVA tenderness.  Musculoskeletal:     Comments: Patient has no unilateral leg swelling no calf tenderness no palpable cords.  Skin:    General: Skin is warm and dry.  Neurological:     Mental Status: He is alert.  Psychiatric:        Mood and Affect: Mood normal.     ED Results / Procedures / Treatments   Labs (all labs ordered are listed, but only abnormal results are displayed) Labs Reviewed  RESP PANEL BY RT-PCR (RSV, FLU A&B, COVID)  RVPGX2  COMPREHENSIVE METABOLIC PANEL  CBC WITH DIFFERENTIAL/PLATELET  BRAIN NATRIURETIC PEPTIDE  TROPONIN I (HIGH SENSITIVITY)    EKG None  Radiology No results found.  Procedures Procedures  {Document cardiac monitor, telemetry assessment procedure when appropriate:1}  Medications Ordered in ED Medications - No data to display  ED Course/ Medical Decision Making/ A&P   {    Click here for ABCD2, HEART and other calculatorsREFRESH Note before signing :1}                              Medical Decision Making Amount and/or Complexity of Data Reviewed Labs: ordered. Radiology: ordered.   This patient presents to the ED for concern of hemoptysis, this involves an extensive number of treatment options, and is a complaint that carries with it a high risk of complications and morbidity.  The differential diagnosis includes PE, ACS, CHF, upper GI bleed,    Additional history obtained:  Additional history obtained from N/A External records from outside source obtained and reviewed including cardiology notes   Co morbidities that complicate the patient evaluation  CAD, CHF, on anticoag's  Social Determinants of Health:  Geriatric    Lab Tests:  I Ordered, and personally interpreted labs.  The pertinent results include:  ***   Imaging Studies ordered:  I ordered imaging studies including chest x-ray I independently visualized and interpreted imaging which showed slight pleural effusion left lower lobe possible infiltrates I agree with the radiologist interpretation   Cardiac Monitoring:  The patient was maintained on a cardiac monitor.  I personally viewed and interpreted the cardiac monitored which showed an underlying rhythm of: EKG   Medicines ordered and prescription drug management:  I ordered medication including ***  for ***  I have reviewed the patients home medicines and have made adjustments as needed  Critical Interventions:  ***   Reevaluation:  Presents with hemoptysis, on my exam patient has notable Rales worse in the left wrist the right, presentation seems consistent with infectious etiology, will obtain screening lab workup, continue to monitor.    Consultations Obtained:  I requested consultation with the ***,  and discussed lab and imaging findings as well as pertinent plan - they recommend: ***    Test  Considered:  ***    Rule out ****    Dispostion and problem list  After consideration of the diagnostic results and the patients response to treatment, I feel that the patent would benefit from ***.       {Document critical care time when appropriate:1} {Document review of labs and clinical decision tools ie heart score, Chads2Vasc2 etc:1}  {Document your independent review of radiology images, and any outside records:1} {Document your discussion with family members, caretakers, and with consultants:1} {Document social determinants of health affecting pt's care:1} {Document your decision making why or why not admission, treatments were needed:1} Final Clinical Impression(s) / ED Diagnoses Final diagnoses:  None    Rx / DC Orders ED Discharge Orders     None

## 2023-06-16 NOTE — ED Notes (Signed)
Patient transported to X-ray 

## 2023-06-16 NOTE — ED Triage Notes (Addendum)
PT arrived from [facility] with a c/o of coughing up dark red blood which started around 530pm. PT also expresses some chest discomfort which he rates a 8 and and throat discomfort. PT had no LOC but some weakness and dizziness. PT has a defibrillator, a hx of CVA and MI. VS are stable at this time and PT is talkative.last time he coughed up blood was around 1830.

## 2023-06-17 DIAGNOSIS — I499 Cardiac arrhythmia, unspecified: Secondary | ICD-10-CM | POA: Diagnosis not present

## 2023-06-17 DIAGNOSIS — R58 Hemorrhage, not elsewhere classified: Secondary | ICD-10-CM | POA: Diagnosis not present

## 2023-06-17 DIAGNOSIS — Z743 Need for continuous supervision: Secondary | ICD-10-CM | POA: Diagnosis not present

## 2023-06-17 DIAGNOSIS — J181 Lobar pneumonia, unspecified organism: Secondary | ICD-10-CM | POA: Diagnosis not present

## 2023-06-17 LAB — COMPREHENSIVE METABOLIC PANEL
ALT: 23 U/L (ref 0–44)
AST: 31 U/L (ref 15–41)
Albumin: 3.2 g/dL — ABNORMAL LOW (ref 3.5–5.0)
Alkaline Phosphatase: 38 U/L (ref 38–126)
Anion gap: 11 (ref 5–15)
BUN: 8 mg/dL (ref 8–23)
CO2: 25 mmol/L (ref 22–32)
Calcium: 8.5 mg/dL — ABNORMAL LOW (ref 8.9–10.3)
Chloride: 107 mmol/L (ref 98–111)
Creatinine, Ser: 1.05 mg/dL (ref 0.61–1.24)
GFR, Estimated: >60 mL/min (ref >60)
Glucose, Bld: 70 mg/dL (ref 70–99)
Potassium: 3.5 mmol/L (ref 3.5–5.1)
Sodium: 143 mmol/L (ref 135–145)
Total Bilirubin: 0.4 mg/dL (ref 0.3–1.2)
Total Protein: 6.1 g/dL — ABNORMAL LOW (ref 6.5–8.1)

## 2023-06-17 LAB — CBC WITH DIFFERENTIAL/PLATELET
Abs Immature Granulocytes: 0.01 10*3/uL (ref 0.00–0.07)
Basophils Absolute: 0 10*3/uL (ref 0.0–0.1)
Basophils Relative: 0 %
Eosinophils Absolute: 0.1 10*3/uL (ref 0.0–0.5)
Eosinophils Relative: 1 %
HCT: 40 % (ref 39.0–52.0)
Hemoglobin: 12.5 g/dL — ABNORMAL LOW (ref 13.0–17.0)
Immature Granulocytes: 0 %
Lymphocytes Relative: 41 %
Lymphs Abs: 2 10*3/uL (ref 0.7–4.0)
MCH: 25.1 pg — ABNORMAL LOW (ref 26.0–34.0)
MCHC: 31.3 g/dL (ref 30.0–36.0)
MCV: 80.2 fL (ref 80.0–100.0)
Monocytes Absolute: 0.3 10*3/uL (ref 0.1–1.0)
Monocytes Relative: 6 %
Neutro Abs: 2.6 10*3/uL (ref 1.7–7.7)
Neutrophils Relative %: 52 %
Platelets: 237 10*3/uL (ref 150–400)
RBC: 4.99 MIL/uL (ref 4.22–5.81)
RDW: 17.7 % — ABNORMAL HIGH (ref 11.5–15.5)
WBC: 5 10*3/uL (ref 4.0–10.5)
nRBC: 0 % (ref 0.0–0.2)

## 2023-06-17 LAB — TROPONIN I (HIGH SENSITIVITY)
Troponin I (High Sensitivity): 34 ng/L — ABNORMAL HIGH (ref <18)
Troponin I (High Sensitivity): 38 ng/L — ABNORMAL HIGH (ref <18)

## 2023-06-17 LAB — RESP PANEL BY RT-PCR (RSV, FLU A&B, COVID)  RVPGX2
Influenza A by PCR: NEGATIVE
Influenza B by PCR: NEGATIVE
Resp Syncytial Virus by PCR: NEGATIVE
SARS Coronavirus 2 by RT PCR: NEGATIVE

## 2023-06-17 LAB — BRAIN NATRIURETIC PEPTIDE: B Natriuretic Peptide: 111.1 pg/mL — ABNORMAL HIGH (ref 0.0–100.0)

## 2023-06-17 MED ORDER — OXYCODONE-ACETAMINOPHEN 5-325 MG PO TABS
1.0000 | ORAL_TABLET | Freq: Once | ORAL | Status: AC
  Administered 2023-06-17: 1 via ORAL
  Filled 2023-06-17: qty 1

## 2023-06-17 MED ORDER — AMOXICILLIN-POT CLAVULANATE 875-125 MG PO TABS: 1.0000 | ORAL_TABLET | Freq: Two times a day (BID) | ORAL | 0 refills | Status: AC

## 2023-06-17 MED ORDER — DOXYCYCLINE HYCLATE 100 MG PO CAPS
100.0000 mg | ORAL_CAPSULE | Freq: Two times a day (BID) | ORAL | 0 refills | Status: AC
Start: 2023-06-17 — End: 2023-06-24

## 2023-06-17 NOTE — ED Notes (Signed)
DC instructions reviewed with pt and PTAR.  PT DC to [facility]

## 2023-06-17 NOTE — Discharge Instructions (Signed)
You have pneumonia, after antibiotics please take your prescribed  Please follow-up with your primary care doctor weeks time for reassessment  Come back to the emergency department if you develop chest pain, shortness of breath, severe abdominal pain, uncontrolled nausea, vomiting, diarrhea.

## 2023-06-17 NOTE — Progress Notes (Signed)
No ICM remote transmission received for 06/17/2023 due to hospitalization and next ICM transmission scheduled for 06/24/2023.

## 2023-06-19 ENCOUNTER — Other Ambulatory Visit: Payer: Self-pay

## 2023-06-19 ENCOUNTER — Encounter (HOSPITAL_COMMUNITY): Payer: Self-pay

## 2023-06-19 ENCOUNTER — Emergency Department (HOSPITAL_COMMUNITY): Payer: 59

## 2023-06-19 ENCOUNTER — Emergency Department (HOSPITAL_COMMUNITY)
Admission: EM | Admit: 2023-06-19 | Discharge: 2023-06-20 | Disposition: A | Discharge status: Home / Self Care | Payer: 59 | Attending: Emergency Medicine | Admitting: Emergency Medicine

## 2023-06-19 ENCOUNTER — Telehealth: Payer: Self-pay | Admitting: Internal Medicine

## 2023-06-19 DIAGNOSIS — R059 Cough, unspecified: Secondary | ICD-10-CM | POA: Diagnosis not present

## 2023-06-19 DIAGNOSIS — Z7901 Long term (current) use of anticoagulants: Secondary | ICD-10-CM | POA: Insufficient documentation

## 2023-06-19 DIAGNOSIS — R079 Chest pain, unspecified: Secondary | ICD-10-CM | POA: Insufficient documentation

## 2023-06-19 DIAGNOSIS — I447 Left bundle-branch block, unspecified: Secondary | ICD-10-CM | POA: Diagnosis not present

## 2023-06-19 DIAGNOSIS — R052 Subacute cough: Secondary | ICD-10-CM | POA: Insufficient documentation

## 2023-06-19 DIAGNOSIS — J439 Emphysema, unspecified: Secondary | ICD-10-CM | POA: Diagnosis not present

## 2023-06-19 DIAGNOSIS — Z743 Need for continuous supervision: Secondary | ICD-10-CM | POA: Diagnosis not present

## 2023-06-19 DIAGNOSIS — I509 Heart failure, unspecified: Secondary | ICD-10-CM | POA: Insufficient documentation

## 2023-06-19 DIAGNOSIS — R0789 Other chest pain: Secondary | ICD-10-CM | POA: Diagnosis not present

## 2023-06-19 LAB — CBC WITH DIFFERENTIAL/PLATELET
Abs Immature Granulocytes: 0.01 10*3/uL (ref 0.00–0.07)
Basophils Absolute: 0 10*3/uL (ref 0.0–0.1)
Basophils Relative: 0 %
Eosinophils Absolute: 0.1 10*3/uL (ref 0.0–0.5)
Eosinophils Relative: 1 %
HCT: 45.6 % (ref 39.0–52.0)
Hemoglobin: 14.6 g/dL (ref 13.0–17.0)
Immature Granulocytes: 0 %
Lymphocytes Relative: 39 %
Lymphs Abs: 2.6 10*3/uL (ref 0.7–4.0)
MCH: 26.1 pg (ref 26.0–34.0)
MCHC: 32 g/dL (ref 30.0–36.0)
MCV: 81.6 fL (ref 80.0–100.0)
Monocytes Absolute: 0.4 10*3/uL (ref 0.1–1.0)
Monocytes Relative: 6 %
Neutro Abs: 3.5 10*3/uL (ref 1.7–7.7)
Neutrophils Relative %: 54 %
Platelets: 237 10*3/uL (ref 150–400)
RBC: 5.59 MIL/uL (ref 4.22–5.81)
RDW: 19 % — ABNORMAL HIGH (ref 11.5–15.5)
WBC: 6.6 10*3/uL (ref 4.0–10.5)
nRBC: 0 % (ref 0.0–0.2)

## 2023-06-19 LAB — PROTIME-INR
INR: 1.2 (ref 0.8–1.2)
Prothrombin Time: 15.8 seconds — ABNORMAL HIGH (ref 11.4–15.2)

## 2023-06-19 LAB — COMPREHENSIVE METABOLIC PANEL
ALT: 22 U/L (ref 0–44)
AST: 29 U/L (ref 15–41)
Albumin: 4.3 g/dL (ref 3.5–5.0)
Alkaline Phosphatase: 50 U/L (ref 38–126)
Anion gap: 15 (ref 5–15)
BUN: 10 mg/dL (ref 8–23)
CO2: 24 mmol/L (ref 22–32)
Calcium: 9.2 mg/dL (ref 8.9–10.3)
Chloride: 98 mmol/L (ref 98–111)
Creatinine, Ser: 1.2 mg/dL (ref 0.61–1.24)
GFR, Estimated: >60 mL/min (ref >60)
Glucose, Bld: 103 mg/dL — ABNORMAL HIGH (ref 70–99)
Potassium: 3.1 mmol/L — ABNORMAL LOW (ref 3.5–5.1)
Sodium: 137 mmol/L (ref 135–145)
Total Bilirubin: 0.7 mg/dL (ref 0.3–1.2)
Total Protein: 7.7 g/dL (ref 6.5–8.1)

## 2023-06-19 LAB — DIGOXIN LEVEL: Digoxin Level: 0.3 ng/mL — ABNORMAL LOW (ref 0.8–2.0)

## 2023-06-19 LAB — TROPONIN I (HIGH SENSITIVITY): Troponin I (High Sensitivity): 31 ng/L — ABNORMAL HIGH (ref <18)

## 2023-06-19 MED ORDER — BENZONATATE 100 MG PO CAPS: 100.0000 mg | ORAL_CAPSULE | Freq: Three times a day (TID) | ORAL | 0 refills | Status: DC

## 2023-06-19 MED ORDER — ALBUTEROL SULFATE HFA 108 (90 BASE) MCG/ACT IN AERS: 2.0000 | INHALATION_SPRAY | RESPIRATORY_TRACT | 3 refills | Status: DC | PRN

## 2023-06-19 NOTE — ED Provider Notes (Signed)
[place] EMERGENCY DEPARTMENT AT [facility] Provider Note   CSN: [id number] Arrival date & time: 06/19/23  2111     History  Chief Complaint  Patient presents with   Chest Pain    Daniel Li is a 67 y.o. male.   Chest Pain  67 year old male, history of severe heart failure ejection fraction of 20%, he also has a history of being anticoagulated on Eliquis, he has severe mitral regurgitation and is scheduled to be evaluated by cardiology for some type of repair or replacement.  He was seen several days ago at the emergency department and diagnosed with a pneumonia started on both Augmentin and doxycycline but states that since that time he has had a persistent cough, increasing chest discomfort and has been coughing up a little bit of blood.  No significant swelling of the legs, he has had a prior stroke times many and is currently living in an assisted care facility.  According to the medical record he is also on amiodarone, digoxin and several antihypertensives including valsartan, sacubitril, metformin and torsemide.    Home Medications Prior to Admission medications   Medication Sig Start Date End Date Taking? Authorizing Provider  albuterol (VENTOLIN HFA) 108 (90 Base) MCG/ACT inhaler Inhale 2 puffs into the lungs every 4 (four) hours as needed for wheezing or shortness of breath. 06/19/23  Yes Eber Hong, MD  amiodarone (PACERONE) 200 MG tablet TAKE (1) TABLET BY MOUTH ONCE DAILY. 07/11/22  Yes Andrey Farmer, PA-C  amoxicillin-clavulanate (AUGMENTIN) 875-125 MG tablet Take 1 tablet by mouth every 12 (twelve) hours for 7 days. 06/17/23 06/24/23 Yes Carroll Sage, PA-C  apixaban (ELIQUIS) 5 MG TABS tablet Take 5 mg by mouth 2 (two) times daily.   Yes [provider]  benzonatate (TESSALON) 100 MG capsule Take 1 capsule (100 mg total) by mouth every 8 (eight) hours. 06/19/23  Yes Eber Hong, MD  digoxin (LANOXIN) 0.125 MG tablet TAKE (1)  TABLET BY MOUTH ONCE DAILY. 10/04/22  Yes Laurey Morale, MD  doxycycline (VIBRAMYCIN) 100 MG capsule Take 1 capsule (100 mg total) by mouth 2 (two) times daily for 7 days. 06/17/23 06/24/23 Yes Carroll Sage, PA-C  empagliflozin (JARDIANCE) 10 MG TABS tablet Take 10 mg by mouth daily.   Yes [provider]  ezetimibe (ZETIA) 10 MG tablet Take 1 tablet (10 mg total) by mouth daily. 03/27/17  Yes BranchDorothe Pea, MD  gabapentin (NEURONTIN) 100 MG capsule Take 100 mg by mouth 2 (two) times daily.   Yes [provider]  lidocaine (LIDODERM) 5 % Place 1 patch onto the skin every 12 (twelve) hours. Leave off 12 hours 05/27/23  Yes [provider]  metFORMIN (GLUCOPHAGE) 500 MG tablet Take 500 mg by mouth daily with breakfast.   Yes [provider]  nitroGLYCERIN (NITROSTAT) 0.4 MG SL tablet PLACE ONE (1) TABLET UNDER TONGUE EVERY 5 MINUTES UP TO (3) DOSES AS NEEDED FOR CHEST PAIN. IF NO RELIEF, CONTACT MD. 09/29/19  Yes Branch, Dorothe Pea, MD  pantoprazole (PROTONIX) 40 MG tablet Take 40 mg by mouth daily.   Yes [provider]  sacubitril-valsartan (ENTRESTO) 24-26 MG Take 1 tablet by mouth 2 (two) times daily. 02/08/23  Yes Laurey Morale, MD  spironolactone (ALDACTONE) 25 MG tablet Take 1 tablet (25 mg total) by mouth daily. 06/10/23  Yes Laurey Morale, MD  torsemide (DEMADEX) 20 MG tablet TAKE (1) TABLET BY MOUTH ONCE EVERY OTHER DAY. 01/31/23  Yes [place], [place], [place]      Allergies    Contrast media [iodinated contrast media] and Pork-derived products    Review of Systems   Review of Systems  Cardiovascular:  Positive for chest pain.  All other systems reviewed and are negative.   Physical Exam Updated Vital Signs BP 96/71   Pulse 66   Temp 97.9 F (36.6 C)   Resp 14   Ht 1.676 m (5\' 6" )   Wt 70.5 kg   SpO2 (!) 89%   BMI 25.09 kg/m  Physical Exam Vitals and nursing note reviewed.  Constitutional:      General: He is not in  acute distress.    Appearance: He is well-developed.  HENT:     Head: Normocephalic and atraumatic.     Mouth/Throat:     Pharynx: No oropharyngeal exudate.     Comments: No teeth Eyes:     General: No scleral icterus.       Right eye: No discharge.        Left eye: No discharge.     Conjunctiva/sclera: Conjunctivae normal.     Pupils: Pupils are equal, round, and reactive to light.  Neck:     Thyroid: No thyromegaly.     Vascular: No JVD.  Cardiovascular:     Rate and Rhythm: Normal rate and regular rhythm.     Heart sounds: Normal heart sounds. No murmur heard.    No friction rub. No gallop.  Pulmonary:     Effort: Pulmonary effort is normal. No respiratory distress.     Breath sounds: Rales present. No wheezing.     Comments: Subtle rales at the bases bilaterally Abdominal:     General: Bowel sounds are normal. There is no distension.     Palpations: Abdomen is soft. There is no mass.     Tenderness: There is no abdominal tenderness.  Musculoskeletal:        General: No tenderness. Normal range of motion.     Cervical back: Normal range of motion and neck supple.     Right lower leg: No edema.     Left lower leg: No edema.  Lymphadenopathy:     Cervical: No cervical adenopathy.  Skin:    General: Skin is warm and dry.     Findings: No erythema or rash.  Neurological:     Mental Status: He is alert.     Coordination: Coordination normal.     Comments: Left arm weakness  Psychiatric:        Behavior: Behavior normal.     ED Results / Procedures / Treatments   Labs (all labs ordered are listed, but only abnormal results are displayed) Labs Reviewed  CBC WITH DIFFERENTIAL/PLATELET - Abnormal; Notable for the following components:      Result Value   RDW 19.0 (*)    All other components within normal limits  PROTIME-INR - Abnormal; Notable for the following components:   Prothrombin Time 15.8 (*)    All other components within normal limits  COMPREHENSIVE  METABOLIC PANEL - Abnormal; Notable for the following components:   Potassium 3.1 (*)    Glucose, Bld 103 (*)    All other components within normal limits  DIGOXIN LEVEL - Abnormal; Notable for the following components:   Digoxin Level 0.3 (*)    All other components within normal limits  TROPONIN I (HIGH SENSITIVITY) - Abnormal; Notable for the following components:   Troponin I (High Sensitivity) 31 (*)  All other components within normal limits  TROPONIN I (HIGH SENSITIVITY)    EKG EKG Interpretation Date/Time:  Wednesday June 19 2023 21:29:46 EDT Ventricular Rate:  66 PR Interval:  229 QRS Duration:  120 QT Interval:  439 QTC Calculation: 460 R Axis:   25  Text Interpretation: Sinus rhythm Prolonged PR interval Nonspecific intraventricular conduction delay Anteroseptal infarct, old Borderline T abnormalities, inferior leads since last tracing no significant change Confirmed by Eber Hong ([id number]) on 06/19/2023 9:42:50 PM  Radiology CT Chest Wo Contrast  Result Date: 06/19/2023 CLINICAL DATA:  Chest pain, recent pneumonia. EXAM: CT CHEST WITHOUT CONTRAST TECHNIQUE: Multidetector CT imaging of the chest was performed following the standard protocol without IV contrast. RADIATION DOSE REDUCTION: This exam was performed according to the departmental dose-optimization program which includes automated exposure control, adjustment of the mA and/or kV according to patient size and/or use of iterative reconstruction technique. COMPARISON:  Chest x-ray 18 2024.  CT PE protocol 03/17/2023 FINDINGS: Cardiovascular: The heart is mildly enlarged. The aorta is normal in size. Left sided pacemaker is present. Mediastinum/Nodes: No enlarged mediastinal or axillary lymph nodes. Thyroid gland, trachea, and esophagus demonstrate no significant findings. Lungs/Pleura: Mild emphysema present. There is no focal lung infiltrate, pleural effusion or pneumothorax. Upper Abdomen: Bilateral adrenal adenomas  appear similar to the prior study measuring up to 2.2 cm on the right and 1.2 cm on the left. Musculoskeletal: No fracture is seen.  No acute fractures are seen. IMPRESSION: 1. No acute cardiopulmonary process. 2. Mild emphysema. 3. Mild cardiomegaly. 4. Stable bilateral adrenal adenomas. Emphysema (ICD10-J43.9). Electronically Signed   By: Darliss Cheney M.D.   On: 06/19/2023 23:16    Procedures Procedures    Medications Ordered in ED Medications - No data to display  ED Course/ Medical Decision Making/ A&P                                 Medical Decision Making Amount and/or Complexity of Data Reviewed Labs: ordered. Radiology: ordered.  Risk Prescription drug management.    This patient presents to the ED for concern of increasing chest discomfort with some ongoing coughing, there is no fevers, he does have an oxygen of 89%, this involves an extensive number of treatment options, and is a complaint that carries with it a high risk of complications and morbidity.  The differential diagnosis includes pneumonia, pneumothorax, bronchiectasis, seems less likely to be pulmonary embolism given compliance with Eliquis, could be congestive heart failure   Co morbidities that complicate the patient evaluation  Severe low ejection fraction   Additional history obtained:  Additional history obtained from medical record External records from outside source obtained and reviewed including prior echocardiogram and cardiology notes   Lab Tests:  I Ordered, and personally interpreted labs.  The pertinent results include: BC and metabolic panel unremarkable, troponin is similar to old troponins, there is no significant increase.  Digoxin level is slightly subtherapeutic   Imaging Studies ordered:  I ordered imaging studies including CT scan of the chest I independently visualized and interpreted imaging which showed no signs of pneumonia no pneumothorax, no other significant  abnormalities, there is some adrenal adenomas for which I told the patient and he needs to follow-up with his family doctor, he is agreeable I agree with the radiologist interpretation   Cardiac Monitoring: / EKG:  The patient was maintained on a cardiac monitor.  I personally viewed and  interpreted the cardiac monitored which showed an underlying rhythm of: Normal sinus rhythm   Consultations Obtained:/ED course  Without any findings to suggest that his ongoing pain for the last couple of days is related to his heart I suspect this is more related to an underlying bronchitis.  He does report that he had recently had some infectious symptoms and had been taking his antibiotics.  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  He is not vomiting but he is not coughing in the room, his oxygen was 95% on room air while he was sleeping when I walked by the room a few minutes ago.  He was informed of his results and is agreeable to the plan.    Social Determinants of Health:  Chronic congestive heart failure   Test / Admission - Considered:  Consider admission but patient is well-appearing and stable for discharge         Final Clinical Impression(s) / ED Diagnoses Final diagnoses:  Subacute cough  Chest pain, unspecified type    Rx / DC Orders ED Discharge Orders          Ordered    benzonatate (TESSALON) 100 MG capsule  Every 8 hours        06/19/23 2348    albuterol (VENTOLIN HFA) 108 (90 Base) MCG/ACT inhaler  Every 4 hours PRN        06/19/23 2348              Eber Hong, MD 06/19/23 2351

## 2023-06-19 NOTE — Discharge Instructions (Addendum)
Thankfully you do not have any acute findings on the CAT scan to suggest that there is pneumonia or blood clots or any other severe complications.  There is no signs of heart attack, however you do have some minor findings on the CT scan that needs to be followed up with your family doctor including what is called an adenoma of your adrenal gland.  Please share these results with your doctor when you follow-up this week.  If you do not have a doctor see the list below.  I want you to return to the ER for severe or worsening symptoms.  [facility] Primary Care Doctor List    Syliva Overman, MD. Specialty: [facility] Medicine Contact information: [address]  [place] [address]  [phone]   Lilyan Punt, MD. Specialty: [facility] Medicine Contact information: [address]  [place] [address]  [phone]   Avon Gully, MD Specialty: Internal Medicine Contact information: [address] [place] [address]  [phone]   Catalina Pizza, MD. Specialty: Internal Medicine Contact information: [address]  [place] [address]  [phone]    [facility] (Dr. Selena Batten) Specialty: Family Medicine Contact information: [address]  [place] [address]  [phone]   John Giovanni, MD. Specialty: [facility] Medicine Contact information: [address]  [address]  [place] [address]  [phone]   Carylon Perches, MD. Specialty: Internal Medicine Contact information: [address]  [address]  [place] [address]  [phone]   [facility] Family Medicine: [address] [phone]  [facility], Family medicine [address]  [phone]  [facility] [address] [place], [place] [phone]  [facility]: [address] [phone]    [facility]  [address] [place], [place] [address] [phone]  Services [facility] offers a variety of basic health services.  Services include but are not limited to: Blood pressure checks  Heart rate checks  Blood sugar checks  Urine analysis  Rapid strep tests  Pregnancy tests.  Health education and referrals  People needing more complex services will be directed to a physician online. Using these virtual visits, doctors can evaluate and prescribe medicine and treatments. There will be no medication on-site, though [facility] will help patients fill their prescriptions at little to no cost.   For More information please go to: [email or web address]  Allergy and Asthma:    [address] [place] [phone]  Urology:  [address]  [place] [phone]  [facility]  [address] [place], [place] [phone]  Orthopedics   [address] [place], [place] [phone]  Endocrinology  [address] [place], [place] [phone]  Podiatry: [facility] [phone]

## 2023-06-19 NOTE — ED Triage Notes (Signed)
Pt BIB [facility] from home, pt was discharged from [facility] 2 days ago, dx with PNA, currently on anbx, chest pain has been ongoing for several days.

## 2023-06-19 NOTE — Telephone Encounter (Signed)
Spoke with Delray Alt who arranges transportation for the patient's group home.  Confirmed with her that the patient will need to be at [facility] at 0730.  Reiterated to her that the patient has instructions (including medication instructions) and another copy will be faxed to them for the nursing staff to have as well. She was grateful for assistance.  Instructions faxed to [phone]  Spoke with Delray Alt again several minutes later. The patient was seen in the ER 8/18 and was diagnosed with bacterial PNA and is being treated with abx. She understands the patient's procedure will likely be cancelled/postponed.

## 2023-06-19 NOTE — Telephone Encounter (Signed)
Patient is requesting call back to discuss upcoming procedure and needing further information.

## 2023-06-20 LAB — TROPONIN I (HIGH SENSITIVITY): Troponin I (High Sensitivity): 26 ng/L — ABNORMAL HIGH (ref <18)

## 2023-06-20 NOTE — Telephone Encounter (Signed)
Daniel Li was evaluated in the ER twice (8/18 and again yesterday, 8/21). He was diagnosed with bacterial pneumonia and bronchitis and is on antibiotics. His 06/26/2023 MitraClip procedure will have to be postponed. Attempted to call the Home where he lives but no staff answered. Will try again later.

## 2023-06-20 NOTE — Telephone Encounter (Signed)
Spoke with Dewayne Hatch at the patient's assisted living facility. Informed her that the patient's mTEER will have to be postponed due to his PNA and bronchitis.  Scheduled the patient with Dr. Lynnette Caffey 07/31/2023 for reevaluation. Will tentatively schedule the patient for mTEER 08/07/2023. Dewayne Hatch was grateful for call and agreed with plan.

## 2023-06-24 ENCOUNTER — Ambulatory Visit: Payer: 59 | Attending: Cardiovascular Disease

## 2023-06-24 DIAGNOSIS — Z95810 Presence of automatic (implantable) cardiac defibrillator: Secondary | ICD-10-CM

## 2023-06-24 DIAGNOSIS — I5022 Chronic systolic (congestive) heart failure: Secondary | ICD-10-CM

## 2023-06-26 DIAGNOSIS — I340 Nonrheumatic mitral (valve) insufficiency: Secondary | ICD-10-CM

## 2023-06-26 NOTE — Telephone Encounter (Signed)
Had messages from [place] upon arrival this AM. Called back and spoke with her directly.  Informed her that I spoke with Dewayne Hatch directly last Wednesday, 8/22, and confirmed new plan for evaluation with Dr. Lynnette Caffey 10/2 and tentative procedure 10/9. Also informed her this was verbally addressed with the patient on Monday, 8/26 when he called the office. Reiterated to Delray Alt that is an Anesthesia protocol to wait for procedures after an acute infection. Margie reported no one from her facility told her the procedure was being delayed and she had the patient at the hospital this AM for the procedure and was told at Admitting he had no procedure scheduled today. Apologies given for the miscommunication. New plan documentation sent to facility.   As soon as the phone call was ended with [facility], the patient called. He was upset about what happened this morning and said no one informed him of changes. Reiterated to him that Dewayne Hatch was notified last week. When he was reminded we spoke Monday, he laughed and stated that was his brother who called from Carroll's personal cell phone and confirmed name and DOB.  Recommended he not let his brother impersonate him on the phone. Apologies given for miscommunication.   New appointment time:  07/31/2023 at 10:00AM with Dr. Lynnette Caffey Tentative procedure date: 08/07/2023

## 2023-06-27 NOTE — Progress Notes (Signed)
EPIC Encounter for ICM Monitoring  Patient Name: Daniel Li is a 67 y.o. male Date: 06/27/2023 Primary Care Physican: Ignatius Specking, MD Primary Cardiologist: [facility] Electrophysiologist: [facility] 08/10/2022 Office Weight: 140 lbs                                                  Transmission fluid levels reviewed.   ED visits 8/18 for pneumonia & 8/21   CorVue thoracic impedance suggesting normal fluid levels with the exception of possible fluid accumulation from 8/10-8/20 (correlates with pneumonia and bronchitis).      Prescribed dosage:  Torsemide 20 mg Take 1 tablet by mouth every other day Spironolactone 25 mg take 1 tablet (25 mg total) daily   Labs: 03/16/2023 Creatinine 1.12, BUN 10, Potassium 5.2, Sodium 144, GFR 72  03/12/2023 Creatinine 1.21, BUN 13, Potassium 3.7, Sodium 142, GFR >60  02/26/2023 Creatinine 1.10, BUN 11, Potassium 4.1, Sodium 145, GFR 74  02/25/2023 Creatinine 1.21, BUN 11, Potassium 3.8, Sodium 145, GFR 66 02/24/2023 Creatinine 1.18, BUN 13, Potassium 3.7, Sodium 145, GFR 68  02/08/2023 Creatinine 1.13, BUN 10, Potassium 4.9, Sodium 140, Gfr >60 08/21/2022 Creatinine 1.62, BUN 14, Potassium 4.6, Sodium 140, GFR 47 08/10/2022 Creatinine 1.50, BUN 15, Potassium 4.3, Sodium 140, GFR 51 04/05/2022 Creatinine 1.46, BUN 12, Potassium 4.4, Sodium 142, GFR 53 A complete set of results can be found in Results Review.   Recommendations:  No changes.    Follow-up plan: ICM clinic phone appointment on 08/05/2023.    91 day device clinic remote transmission 08/12/2023.     EP/Cardiology Office Visits:   08/09/2023 with HF clinic.   07/31/2023 with Dr Lynnette Caffey.   Recall 04/04/2023 with Ulyses Jarred, PA.     Copy of ICM check sent to Dr. Nelly Laurence.    3 month ICM trend: 06/24/2023.    12-14 Month ICM trend:     Karie Soda, RN 06/27/2023 8:14 AM

## 2023-07-04 ENCOUNTER — Ambulatory Visit: Payer: 59 | Attending: Internal Medicine | Admitting: Pharmacist Clinician (PhC)/ Clinical Pharmacy Specialist

## 2023-07-04 ENCOUNTER — Encounter: Payer: Self-pay | Admitting: Pharmacist Clinician (PhC)/ Clinical Pharmacy Specialist

## 2023-07-04 ENCOUNTER — Other Ambulatory Visit (HOSPITAL_COMMUNITY): Payer: Self-pay

## 2023-07-04 DIAGNOSIS — E7800 Pure hypercholesterolemia, unspecified: Secondary | ICD-10-CM

## 2023-07-04 NOTE — Progress Notes (Unsigned)
Office Visit    Patient Name: Daniel Li Date of Encounter: 07/04/2023  Primary Care Provider:  Ignatius Specking, MD Primary Cardiologist:  Dina Rich, MD  Chief Complaint    Hyperlipidemia   Significant Past Medical History   CHF HFrEF - at 20% by echo 04/2023, on Entresto, spironolactone, empagliflozin  (carvedilol d/c 2/2 borderline low outupt)  CAD 2013 STEMI - BMS to RCA, 2014 NSTEMI with CTO of LCx unable to intervene; 2018 STEMI - DES to pLAD; 01/2018 NSTEMI, 07/2018 NSTEMI - CTO PLV, DES to mLAD  HTN Controlled with HF medications  PE 02/2023 - started on apixaban  DM2 2/24 A1c 6.3, down from 7.7 in past; on empagliflozin 10 mg, metformin 500 mg  CVA Hx of multiple CVA's, 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 male patient of Dr Shirlee Latch, in the office today to discuss options for cholesterol management.  He was previously on ezetimibe and evolocumab, however the later was discontinued in a hospital visit earlier this year, and not restarted on discharge.  Call facility to scheule appoinment  Insurance Carrier:  [facility] Medicare Dual complete  LDL Cholesterol goal:  LDL < 55  Current Medications: ezetimibe 10 mg every day,   Previously tried:  atorvastatin, rosuvastatin, simvastatin -myalgias  Icosapent ethyl, fenofibrate  Family Hx:   mother had several MI's, stroke, both siblings have had MI and stroke,   Social Hx: Tobacco: down to 1 cigarette per day Alcohol: occasional beer  Diet: eats some facility food, but also gets occasional food from store  Exercise:  walks as able, with cane  Adherence Assessment     Lives in Long term care facility   Accessory Clinical Findings   Lab Results  Component Value Date   CHOL 238 (H) 06/10/2023   HDL 72 06/10/2023   LDLCALC 144 (H) 06/10/2023   TRIG 111 06/10/2023   CHOLHDL 3.3 06/10/2023    Lipoprotein (a)  Date/Time Value Ref Range Status  03/23/2022 04:14 AM 231.9 (H) <75.0 nmol/L Final    Comment:    (NOTE) Note:  Values greater than or equal to 75.0 nmol/L may       indicate an independent risk factor for CHD,       but must be evaluated with caution when applied       to non-Caucasian populations due to the       influence of genetic factors on Lp(a) across       ethnicities. Performed At: [facility] [address] [place], [place] [id number] Jolene Schimke MD WU:[id number]     Lab Results  Component Value Date   ALT 22 06/19/2023   AST 29 06/19/2023   ALKPHOS 50 06/19/2023   BILITOT 0.7 06/19/2023   Lab Results  Component Value Date   CREATININE 1.20 06/19/2023   BUN 10 06/19/2023   NA 137 06/19/2023   K 3.1 (L) 06/19/2023   CL 98 06/19/2023   CO2 24 06/19/2023   Lab Results  Component Value Date   HGBA1C 6.3 (H) 12/10/2022    Home Medications  Current Outpatient Medications  Medication Sig Dispense Refill   amiodarone (PACERONE) 200 MG tablet TAKE (1) TABLET BY MOUTH ONCE DAILY. 90 tablet 3   apixaban (ELIQUIS) 5 MG TABS tablet Take 5 mg by mouth 2 (two) times daily.     aspirin EC 81 MG tablet Take 81 mg by mouth daily. Swallow whole.     digoxin (LANOXIN) 0.125 MG tablet TAKE (1) TABLET BY MOUTH ONCE DAILY. 30 tablet 10   empagliflozin (JARDIANCE) 10 MG TABS tablet Take 10 mg by mouth daily.     ezetimibe (ZETIA) 10 MG tablet Take 1 tablet (10 mg total) by mouth daily. 30 tablet 3   gabapentin (NEURONTIN) 100 MG capsule Take 100 mg by mouth 2 (two) times daily.     lidocaine (LIDODERM) 5 % Place 1 patch onto the skin every 12 (twelve) hours. Leave off 12 hours     metFORMIN (GLUCOPHAGE) 500 MG  tablet Take 500 mg by mouth daily with breakfast.     nitroGLYCERIN (NITROSTAT) 0.4 MG SL tablet PLACE ONE (1) TABLET UNDER TONGUE EVERY 5 MINUTES UP TO (3) DOSES AS NEEDED FOR CHEST PAIN. IF NO RELIEF, CONTACT MD. 25 tablet 3   pantoprazole (PROTONIX) 40 MG tablet Take 40 mg by mouth daily.     sacubitril-valsartan (ENTRESTO) 24-26 MG Take 1 tablet by mouth 2 (two) times daily. 180 tablet 3   spironolactone (ALDACTONE) 25 MG tablet Take 1 tablet (25 mg total) by mouth daily. 90 tablet 3   torsemide (DEMADEX) 20 MG tablet TAKE (1) TABLET BY MOUTH ONCE EVERY OTHER DAY. 15 tablet 11   albuterol (VENTOLIN HFA) 108 (90 Base) MCG/ACT inhaler Inhale 2 puffs into the lungs every 4 (four) hours as needed for wheezing or shortness of breath. 1 each 3   benzonatate (TESSALON) 100 MG capsule Take 1 capsule (100 mg total) by mouth every 8 (eight) hours. 21 capsule 0   No current facility-administered medications for this visit.     Assessment & Plan    Hypercholesterolemia Assessment: Patient with ASCVD not at LDL goal of < 55 Most recent LDL 144 on 06/10/23 Has been compliant with ezetimibe : 10 mg daily Not able to tolerate statins secondary to myalgias Previously was on Repatha, however did not like the facility staff giving him injections.  Felt they were too rough and will not do this again.   Reviewed options for lowering LDL cholesterol -  inclisiran.  Discussed mechanisms of action, dosing, side effects, potential decreases in LDL cholesterol and costs.  He is more comfortable with this, as the injections will be given at the Infusion Center Plan: Patient agreeable to starting Leqvio Repeat labs after:  4 months Lipid Liver function    Phillips Hay, PharmD CPP [facility] [address]  [place], [place] [address] [phone]  07/04/2023, 1:37 PM

## 2023-07-04 NOTE — Patient Instructions (Signed)
Your Results:             Your most recent labs Goal  Total Cholesterol 238 < 200  Triglycerides 111 < 150  HDL (happy/good cholesterol) 72 > 40  LDL (lousy/bad cholesterol 144 < 55   Medication changes:  We will start the process to get Leqvio covered by your insurance.  Once the prior authorization is complete,  you will receive a call from [facility].  They will set up a time for you to come in for the injection   STOP POTASSIUM CL 10 mEq  SPIRONOLACTONE DOSE SHOULD BE 25 MG ONCE DAILY  Lab orders:  We want to repeat labs after 2-3 months.  We will send you a lab order to remind you once we get closer to that time.     Thank you for choosing [facility]

## 2023-07-04 NOTE — Assessment & Plan Note (Signed)
Assessment: Patient with ASCVD not at LDL goal of < 55 Most recent LDL 144 on 06/10/23 Has been compliant with ezetimibe : 10 mg daily Not able to tolerate statins secondary to myalgias Previously was on Repatha, however did not like the facility staff giving him injections.  Felt they were too rough and will not do this again.   Reviewed options for lowering LDL cholesterol -  inclisiran.  Discussed mechanisms of action, dosing, side effects, potential decreases in LDL cholesterol and costs.  He is more comfortable with this, as the injections will be given at the Infusion Center Plan: Patient agreeable to starting Leqvio Repeat labs after:  4 months Lipid Liver function

## 2023-07-09 DIAGNOSIS — G720 Drug-induced myopathy: Secondary | ICD-10-CM | POA: Diagnosis not present

## 2023-07-09 DIAGNOSIS — Z299 Encounter for prophylactic measures, unspecified: Secondary | ICD-10-CM | POA: Diagnosis not present

## 2023-07-09 DIAGNOSIS — I10 Essential (primary) hypertension: Secondary | ICD-10-CM | POA: Diagnosis not present

## 2023-07-09 DIAGNOSIS — T466X5A Adverse effect of antihyperlipidemic and antiarteriosclerotic drugs, initial encounter: Secondary | ICD-10-CM | POA: Diagnosis not present

## 2023-07-09 DIAGNOSIS — F17210 Nicotine dependence, cigarettes, uncomplicated: Secondary | ICD-10-CM | POA: Diagnosis not present

## 2023-07-10 ENCOUNTER — Telehealth: Payer: Self-pay

## 2023-07-10 NOTE — Telephone Encounter (Signed)
Daniel Li, note this patients leqvio has been approved, he will be scheduled as soon as possible.  Auth Submission: APPROVED Site of care: Site of care: [facility] Payer: [employer] medicare Medication & CPT/J Code(s) submitted: Leqvio (Inclisiran) [id number] Route of submission (phone, fax, portal): Portal Phone # Fax # Auth type: Buy/Bill PB Units/visits requested: 284mg  x 2 doses Reference number: [id number] Approval from: 07/04/23 to 07/03/24

## 2023-07-11 DIAGNOSIS — B351 Tinea unguium: Secondary | ICD-10-CM | POA: Diagnosis not present

## 2023-07-11 DIAGNOSIS — E1142 Type 2 diabetes mellitus with diabetic polyneuropathy: Secondary | ICD-10-CM | POA: Diagnosis not present

## 2023-07-15 ENCOUNTER — Other Ambulatory Visit (HOSPITAL_COMMUNITY): Payer: Self-pay | Admitting: Cardiology

## 2023-07-16 ENCOUNTER — Other Ambulatory Visit (HOSPITAL_COMMUNITY): Payer: Self-pay

## 2023-07-16 DIAGNOSIS — M25361 Other instability, right knee: Secondary | ICD-10-CM | POA: Diagnosis not present

## 2023-07-16 DIAGNOSIS — G8929 Other chronic pain: Secondary | ICD-10-CM | POA: Diagnosis not present

## 2023-07-16 DIAGNOSIS — M1711 Unilateral primary osteoarthritis, right knee: Secondary | ICD-10-CM | POA: Diagnosis not present

## 2023-07-16 MED ORDER — AMIODARONE HCL 200 MG PO TABS: 200.0000 mg | ORAL_TABLET | Freq: Every day | ORAL | 3 refills | Status: DC

## 2023-07-17 DIAGNOSIS — H2513 Age-related nuclear cataract, bilateral: Secondary | ICD-10-CM | POA: Diagnosis not present

## 2023-07-17 DIAGNOSIS — Z7984 Long term (current) use of oral hypoglycemic drugs: Secondary | ICD-10-CM | POA: Diagnosis not present

## 2023-07-17 DIAGNOSIS — E119 Type 2 diabetes mellitus without complications: Secondary | ICD-10-CM | POA: Diagnosis not present

## 2023-07-17 DIAGNOSIS — Z794 Long term (current) use of insulin: Secondary | ICD-10-CM | POA: Diagnosis not present

## 2023-07-18 ENCOUNTER — Ambulatory Visit (INDEPENDENT_AMBULATORY_CARE_PROVIDER_SITE_OTHER): Payer: 59

## 2023-07-18 VITALS — BP 103/72 | HR 63 | Temp 98.2°F | Resp 18 | Ht 66.5 in | Wt 154.0 lb

## 2023-07-18 DIAGNOSIS — E7800 Pure hypercholesterolemia, unspecified: Secondary | ICD-10-CM | POA: Diagnosis not present

## 2023-07-18 MED ORDER — INCLISIRAN SODIUM 284 MG/1.5ML ~~LOC~~ SOSY
284.0000 mg | PREFILLED_SYRINGE | Freq: Once | SUBCUTANEOUS | Status: AC
  Administered 2023-07-18: 284 mg via SUBCUTANEOUS
  Filled 2023-07-18: qty 1.5

## 2023-07-18 NOTE — Progress Notes (Signed)
Diagnosis: Hyperlipidemia  Provider:  Chilton Greathouse MD  Procedure: Injection   Leqvio (inclisiran), Dose: 284 mg, Site: subcutaneous, Number of injections: 1  Post Care: Observation period completed  Discharge: Condition: Good, Destination: Home . AVS Provided  Performed by:  Rico Ala, LPN

## 2023-07-23 ENCOUNTER — Other Ambulatory Visit (HOSPITAL_BASED_OUTPATIENT_CLINIC_OR_DEPARTMENT_OTHER): Payer: Self-pay | Admitting: Pharmacist Clinician (PhC)/ Clinical Pharmacy Specialist
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
## 2023-07-31 ENCOUNTER — Encounter: Payer: Self-pay | Admitting: Internal Medicine

## 2023-07-31 ENCOUNTER — Ambulatory Visit: Payer: 59 | Attending: Internal Medicine | Admitting: Internal Medicine

## 2023-07-31 VITALS — BP 86/60 | HR 73 | Ht 66.5 in | Wt 149.0 lb

## 2023-07-31 DIAGNOSIS — I340 Nonrheumatic mitral (valve) insufficiency: Secondary | ICD-10-CM

## 2023-07-31 DIAGNOSIS — E1159 Type 2 diabetes mellitus with other circulatory complications: Secondary | ICD-10-CM

## 2023-07-31 DIAGNOSIS — I739 Peripheral vascular disease, unspecified: Secondary | ICD-10-CM

## 2023-07-31 DIAGNOSIS — E1169 Type 2 diabetes mellitus with other specified complication: Secondary | ICD-10-CM

## 2023-07-31 DIAGNOSIS — I2510 Atherosclerotic heart disease of native coronary artery without angina pectoris: Secondary | ICD-10-CM | POA: Diagnosis not present

## 2023-07-31 DIAGNOSIS — Z01812 Encounter for preprocedural laboratory examination: Secondary | ICD-10-CM

## 2023-07-31 DIAGNOSIS — E785 Hyperlipidemia, unspecified: Secondary | ICD-10-CM | POA: Diagnosis not present

## 2023-07-31 DIAGNOSIS — I152 Hypertension secondary to endocrine disorders: Secondary | ICD-10-CM | POA: Diagnosis not present

## 2023-07-31 DIAGNOSIS — Z95810 Presence of automatic (implantable) cardiac defibrillator: Secondary | ICD-10-CM | POA: Diagnosis not present

## 2023-07-31 DIAGNOSIS — I5042 Chronic combined systolic (congestive) and diastolic (congestive) heart failure: Secondary | ICD-10-CM

## 2023-07-31 NOTE — Addendum Note (Signed)
Addended by: Franchot Gallo on: 07/31/2023 11:00 AM   Modules accepted: Orders

## 2023-07-31 NOTE — Patient Instructions (Signed)
Medication Instructions:  Your physician recommends that you continue on your current medications as directed. Please refer to the Current Medication list given to you today.  *If you need a refill on your cardiac medications before your next appointment, please call your pharmacy*  Lab Work: TODAY: CMET, INR, CBC, BNP If you have labs (blood work) drawn today and your tests are completely normal, you will receive your results only by: MyChart Message (if you have MyChart) OR A paper copy in the mail If you have any lab test that is abnormal or we need to change your treatment, we will call you to review the results.  Testing/Procedures: None ordered today.  Follow-Up: Will be scheduled following your procedure.

## 2023-08-01 LAB — SPECIMEN STATUS

## 2023-08-02 ENCOUNTER — Other Ambulatory Visit: Payer: Self-pay

## 2023-08-02 ENCOUNTER — Encounter: Payer: Self-pay | Admitting: Student

## 2023-08-02 ENCOUNTER — Telehealth: Payer: Self-pay | Admitting: Internal Medicine

## 2023-08-02 DIAGNOSIS — I340 Nonrheumatic mitral (valve) insufficiency: Secondary | ICD-10-CM

## 2023-08-02 LAB — PRO B NATRIURETIC PEPTIDE: NT-Pro BNP: 591 pg/mL — ABNORMAL HIGH (ref 0–376)

## 2023-08-02 LAB — COMPREHENSIVE METABOLIC PANEL
ALT: 27 [IU]/L (ref 0–44)
AST: 39 [IU]/L (ref 0–40)
Albumin: 4.3 g/dL (ref 3.9–4.9)
Alkaline Phosphatase: 73 [IU]/L (ref 44–121)
BUN/Creatinine Ratio: 7 — ABNORMAL LOW (ref 10–24)
BUN: 9 mg/dL (ref 8–27)
Bilirubin Total: 0.4 mg/dL (ref 0.0–1.2)
CO2: 27 mmol/L (ref 20–29)
Calcium: 10 mg/dL (ref 8.6–10.2)
Chloride: 100 mmol/L (ref 96–106)
Creatinine, Ser: 1.21 mg/dL (ref 0.76–1.27)
Globulin, Total: 2.4 g/dL (ref 1.5–4.5)
Glucose: 132 mg/dL — ABNORMAL HIGH (ref 70–99)
Potassium: 4 mmol/L (ref 3.5–5.2)
Sodium: 144 mmol/L (ref 134–144)
Total Protein: 6.7 g/dL (ref 6.0–8.5)
eGFR: 66 mL/min/{1.73_m2} (ref >59)

## 2023-08-02 LAB — CBC
Hematocrit: 45.6 % (ref 37.5–51.0)
Hemoglobin: 14.5 g/dL (ref 13.0–17.7)
MCH: 26.6 pg (ref 26.6–33.0)
MCHC: 31.8 g/dL (ref 31.5–35.7)
MCV: 84 fL (ref 79–97)
Platelets: 261 10*3/uL (ref 150–450)
RBC: 5.45 x10E6/uL (ref 4.14–5.80)
RDW: 16.9 % — ABNORMAL HIGH (ref 11.6–15.4)
WBC: 5.6 10*3/uL (ref 3.4–10.8)

## 2023-08-02 LAB — SPECIMEN STATUS REPORT

## 2023-08-02 NOTE — Progress Notes (Signed)
  Pt has very complicated medical and social history, with limited transportation.   Pending trans-catheter Mitral Valve repair next week.   By last remote pt is not dependent and has normal device function.   OK to proceed with magnet placement.   In the event surgery would need to transition to open, would need industry to program tachy-therapies to off to avoid inappropriate therapies as magnet would enter into sterile field.   Casimiro Needle [address] [place], PA-C  08/02/2023 3:48 PM

## 2023-08-02 NOTE — Telephone Encounter (Signed)
[facility] was wondering if there was an extra tube in the package and if we want any additional testing they will have the tube till the 9th. It is a light blue. Please advise

## 2023-08-02 NOTE — Telephone Encounter (Signed)
INR already ordered to be drawn DOS.

## 2023-08-05 ENCOUNTER — Ambulatory Visit: Payer: 59 | Attending: Cardiovascular Disease

## 2023-08-05 DIAGNOSIS — Z95810 Presence of automatic (implantable) cardiac defibrillator: Secondary | ICD-10-CM

## 2023-08-05 DIAGNOSIS — I5042 Chronic combined systolic (congestive) and diastolic (congestive) heart failure: Secondary | ICD-10-CM

## 2023-08-06 ENCOUNTER — Other Ambulatory Visit: Payer: Self-pay

## 2023-08-06 ENCOUNTER — Encounter (HOSPITAL_COMMUNITY): Payer: Self-pay | Admitting: Internal Medicine

## 2023-08-06 MED ORDER — DEXMEDETOMIDINE HCL IN NACL 400 MCG/100ML IV SOLN
0.1000 ug/kg/h | INTRAVENOUS | Status: DC
  Filled 2023-08-06: qty 100

## 2023-08-06 MED ORDER — PLASMA-LYTE A IV SOLN
INTRAVENOUS | Status: DC
  Filled 2023-08-06 (×2): qty 2.5

## 2023-08-06 MED ORDER — CEFAZOLIN SODIUM-DEXTROSE 2-4 GM/100ML-% IV SOLN
2.0000 g | INTRAVENOUS | Status: DC
  Filled 2023-08-06: qty 100

## 2023-08-06 MED ORDER — HEPARIN 30,000 UNITS/1000 ML (OHS) CELLSAVER SOLUTION
Status: DC
  Filled 2023-08-06 (×2): qty 1000

## 2023-08-06 MED ORDER — TRANEXAMIC ACID (OHS) PUMP PRIME SOLUTION
2.0000 mg/kg | INTRAVENOUS | Status: DC
  Filled 2023-08-06 (×2): qty 1.35

## 2023-08-06 MED ORDER — NITROGLYCERIN IN D5W 200-5 MCG/ML-% IV SOLN
2.0000 ug/min | INTRAVENOUS | Status: DC
  Filled 2023-08-06: qty 250

## 2023-08-06 MED ORDER — MILRINONE LACTATE IN DEXTROSE 20-5 MG/100ML-% IV SOLN
0.3000 ug/kg/min | INTRAVENOUS | Status: DC
  Filled 2023-08-06 (×2): qty 100

## 2023-08-06 MED ORDER — TRANEXAMIC ACID (OHS) BOLUS VIA INFUSION
15.0000 mg/kg | INTRAVENOUS | Status: DC
  Filled 2023-08-06 (×2): qty 1014

## 2023-08-06 MED ORDER — NOREPINEPHRINE 4 MG/250ML-% IV SOLN
0.0000 ug/min | INTRAVENOUS | Status: DC
  Filled 2023-08-06: qty 250

## 2023-08-06 MED ORDER — PHENYLEPHRINE HCL-NACL 20-0.9 MG/250ML-% IV SOLN
30.0000 ug/min | INTRAVENOUS | Status: DC
  Filled 2023-08-06 (×2): qty 250

## 2023-08-06 MED ORDER — EPINEPHRINE HCL 5 MG/250ML IV SOLN IN NS
0.0000 ug/min | INTRAVENOUS | Status: DC
  Filled 2023-08-06 (×2): qty 250

## 2023-08-06 MED ORDER — TRANEXAMIC ACID 1000 MG/10ML IV SOLN
1.5000 mg/kg/h | INTRAVENOUS | Status: DC
  Filled 2023-08-06 (×2): qty 25

## 2023-08-06 MED ORDER — VANCOMYCIN HCL 1250 MG/250ML IV SOLN
1250.0000 mg | INTRAVENOUS | Status: DC
  Filled 2023-08-06 (×2): qty 250

## 2023-08-06 MED ORDER — INSULIN REGULAR(HUMAN) IN NACL 100-0.9 UT/100ML-% IV SOLN
INTRAVENOUS | Status: DC
  Filled 2023-08-06 (×2): qty 100

## 2023-08-06 MED ORDER — POTASSIUM CHLORIDE 2 MEQ/ML IV SOLN
80.0000 meq | INTRAVENOUS | Status: DC
  Filled 2023-08-06 (×2): qty 40

## 2023-08-06 MED ORDER — MAGNESIUM SULFATE 50 % IJ SOLN
40.0000 meq | INTRAMUSCULAR | Status: DC
  Filled 2023-08-06 (×2): qty 9.85

## 2023-08-06 NOTE — Progress Notes (Signed)
PCP - Ignatius Specking, MD Cardiologist - Branch, Dorothe Pea, MD Electrophysiology - Cardiology Hillis Range, MD Advanced Heart Failure - Cardiology Laurey Morale, MD   PPM/ICD - [employer] Jude ICD Device Orders - message sent Rep Notified - yes Arlys John  Chest x-ray - 06-16-23 Chest CT 06-16-23 EKG - 07-31-23 Stress Test - 06-29-14 ECHO - 08-02-23 Cardiac Cath - 11-23-21  CPAP -     Fasting Blood Sugar - 92-210 per caregiver at facility lowest to highest range last eight days Checks Blood Sugar daily  Blood Thinner Instructions: Eliquis  stop date 08-04-23 per instructions from cardiology and caregiver confirms Aspirin Instructions: Jonne Ply per caregiver patient is not receiving asa  ERAS Protcol - NPO  COVID TEST- n/a  Anesthesia review: Yes CAD, DM, HTN AICD Information obtained from chart and medication and DM medication from caregiver at facility   Patient verbally denies any shortness of breath, fever, cough and chest pain during phone call   -------------  SDW INSTRUCTIONS given:  Your procedure is scheduled on [facility] August 07, 2023.  Report to [facility] Main Entrance "A" at 6:30 A.M., and check in at the Admitting office.  Call this number if you have problems the morning of surgery:  [phone]   Remember:  Do not eat  or drink after midnight the night before your surgery    Take these medicines the morning of surgery with A SIP OF WATER  amiodarone (PACERONE)  sacubitril-valsartan (ENTRESTO)  digoxin (LANOXIN)  spironolactone (ALDACTONE)  aspirin  Per caregiver at facility patient not taking   IF NEEDED acetaminophen (TYLENOL)  albuterol (VENTOLIN HFA)  nitroGLYCERIN (NITROSTAT)  traMADol (ULTRAM)    WHAT DO I DO ABOUT MY DIABETES MEDICATION?   Do not take oral diabetes medicines (pills) the morning of surgery.  empagliflozin    (JARDIANCE) Stop date 08-03-23 per cardiology instruction caregiver confirmed metFORMIN (GLUCOPHAGE) Made aware not to take in  am  The day of surgery, do not take other diabetes injectables, including Byetta (exenatide), Bydureon (exenatide ER), Victoza (liraglutide), or Trulicity (dulaglutide).  If your CBG is greater than 220 mg/dL, you may take  of your sliding scale (correction) dose of insulin.   HOW TO MANAGE YOUR DIABETES BEFORE AND AFTER SURGERY  Why is it important to control my blood sugar before and after surgery? Improving blood sugar levels before and after surgery helps healing and can limit problems. A way of improving blood sugar control is eating a healthy diet by:  Eating less sugar and carbohydrates  Increasing activity/exercise  Talking with your doctor about reaching your blood sugar goals High blood sugars (greater than 180 mg/dL) can raise your risk of infections and slow your recovery, so you will need to focus on controlling your diabetes during the weeks before surgery. Make sure that the doctor who takes care of your diabetes knows about your planned surgery including the date and location.  How do I manage my blood sugar before surgery? Check your blood sugar at least 4 times a day, starting 2 days before surgery, to make sure that the level is not too high or low.  Check your blood sugar the morning of your surgery when you wake up and every 2 hours until you get to the Short Stay unit.  If your blood sugar is less than 70 mg/dL, you will need to treat for low blood sugar: Do not take insulin. Treat a low blood sugar (less than 70 mg/dL) with  cup of  clear juice (cranberry or apple), 4 glucose tablets, OR glucose gel. Recheck blood sugar in 15 minutes after treatment (to make sure it is greater than 70 mg/dL). If your blood sugar is not greater than 70 mg/dL on recheck, call [phone] for further instructions. Report your blood sugar to the short stay nurse when you get to Short Stay.  If you are admitted to the hospital after surgery: Your blood sugar will be checked by the  staff and you will probably be given insulin after surgery (instead of oral diabetes medicines) to make sure you have good blood sugar levels. The goal for blood sugar control after surgery is 80-180 mg/dL.    As of today, STOP taking any Aspirin (unless otherwise instructed by your surgeon) Aleve, Naproxen, Ibuprofen, Motrin, Advil, Goody's, BC's, all herbal medications, fish oil, and all vitamins. THIS INCLUDES YOUR lidocaine (LIDODERM) .                      Do not wear jewelry, make up, or nail polish            Do not wear lotions, powders, perfumes/colognes, or deodorant.            Do not shave 48 hours prior to surgery.  Men may shave face and neck.            Do not bring valuables to the hospital.            [facility] is not responsible for any belongings or valuables.  Do NOT Smoke (Tobacco/Vaping) 24 hours prior to your procedure If you use a CPAP at night, you may bring all equipment for your overnight stay.   Contacts, glasses, dentures or bridgework may not be worn into surgery.      For patients admitted to the hospital, discharge time will be determined by your treatment team.   Patients discharged the day of surgery will not be allowed to drive home, and someone needs to stay with them for 24 hours.    Special instructions:   [place]- Preparing For Surgery  Before surgery, you can play an important role. Because skin is not sterile, your skin needs to be as free of germs as possible. You can reduce the number of germs on your skin by washing with CHG (chlorahexidine gluconate) Soap before surgery.  CHG is an antiseptic cleaner which kills germs and bonds with the skin to continue killing germs even after washing.    Oral Hygiene is also important to reduce your risk of infection.  Remember - BRUSH YOUR TEETH THE MORNING OF SURGERY WITH YOUR REGULAR TOOTHPASTE  Please do not use if you have an allergy to CHG or antibacterial soaps. If your skin becomes  reddened/irritated stop using the CHG.  Do not shave (including legs and underarms) for at least 48 hours prior to first CHG shower. It is OK to shave your face.  Please follow these instructions carefully.   Shower the NIGHT BEFORE SURGERY and the MORNING OF SURGERY with DIAL Soap.   Pat yourself dry with a CLEAN TOWEL.  Wear CLEAN PAJAMAS to bed the night before surgery  Place CLEAN SHEETS on your bed the night of your first shower and DO NOT SLEEP WITH PETS.   Day of Surgery: Please shower morning of surgery  Wear Clean/Comfortable clothing the morning of surgery Do not apply any deodorants/lotions.   Remember to brush your teeth WITH YOUR REGULAR TOOTHPASTE.  Questions were answered. Patient verbalized understanding of instructions.

## 2023-08-06 NOTE — Anesthesia Preprocedure Evaluation (Addendum)
Anesthesia Evaluation  Patient identified by MRN, date of birth, ID band Patient awake    Reviewed: Allergy & Precautions, NPO status , Patient's Chart, lab work & pertinent test results  Airway Mallampati: II  TM Distance: >3 FB Neck ROM: Full    Dental no notable dental hx. (+) Edentulous Upper, Edentulous Lower   Pulmonary sleep apnea , former smoker   Pulmonary exam normal        Cardiovascular hypertension, Pt. on medications + CAD, + Past MI, + Cardiac Stents and +CHF  + Cardiac Defibrillator + Valvular Problems/Murmurs MR  Rhythm:Regular Rate:Normal + Systolic murmurs ECHO 07/24:  1. There is slow flow in LV apex on Definity imaging but no obvious LV clot. Left ventricular ejection fraction, by estimation, is 20%. The left ventricle has severely decreased function. The left ventricle demonstrates regional wall motion abnormalities (see scoring diagram/findings for description). The left ventricular internal cavity size was severely dilated. Left ventricular diastolic parameters are consistent with Grade I diastolic dysfunction (impaired relaxation).  2. Right ventricular systolic function is mildly reduced. The right ventricular size is normal.  3. Left atrial size was moderately dilated.  4. Loose chordal structures associated with anterior MV leaflet. The mitral valve is degenerative. Moderate to severe mitral valve regurgitation. No evidence of mitral stenosis.  5. The aortic valve is normal in structure. Aortic valve regurgitation is not visualized. No aortic stenosis is present.  6. The inferior vena cava is normal in size with greater than 50% respiratory variability, suggesting right atrial pressure of 3 mmHg.     Neuro/Psych  Headaches  Anxiety Depression    CVA, Residual Symptoms    GI/Hepatic Neg liver ROS,GERD  Medicated,,  Endo/Other  diabetes, Type 2, Oral Hypoglycemic Agents    Renal/GU negative  Renal ROS     Musculoskeletal  (+) Arthritis , Osteoarthritis,    Abdominal Normal abdominal exam  (+)   Peds  Hematology Lab Results      Component                Value               Date                      WBC                      5.6                 07/31/2023                HGB                      14.5                07/31/2023                HCT                      45.6                07/31/2023                MCV                      84                  07/31/2023  PLT                      261                 07/31/2023             Lab Results      Component                Value               Date                      NA                       144                 07/31/2023                K                        4.0                 07/31/2023                CO2                      27                  07/31/2023                GLUCOSE                  132 (H)             07/31/2023                BUN                      9                   07/31/2023                CREATININE               1.21                07/31/2023                CALCIUM                  10.0                07/31/2023                EGFR                     66                  07/31/2023                GFRNONAA                 >60                 06/19/2023              Anesthesia Other Findings   Reproductive/Obstetrics  Anesthesia Physical Anesthesia Plan  ASA: 4  Anesthesia Plan: General   Post-op Pain Management:    Induction: Intravenous  PONV Risk Score and Plan: 2 and Ondansetron, Dexamethasone, Midazolam and Treatment may vary due to age or medical condition  Airway Management Planned: Mask and Oral ETT  Additional Equipment: Arterial line  Intra-op Plan:   Post-operative Plan: Extubation in OR  Informed Consent: I have reviewed the patients History and Physical, chart, labs and discussed the procedure  including the risks, benefits and alternatives for the proposed anesthesia with the patient or authorized representative who has indicated his/her understanding and acceptance.     Dental advisory given  Plan Discussed with: CRNA  Anesthesia Plan Comments:        Anesthesia Quick Evaluation

## 2023-08-07 ENCOUNTER — Encounter (HOSPITAL_COMMUNITY)
Admission: RE | Disposition: A | Discharge status: Nursing Home (Includes ICF) | Payer: 59 | Source: Skilled Nursing Facility | Attending: Internal Medicine

## 2023-08-07 ENCOUNTER — Inpatient Hospital Stay (HOSPITAL_COMMUNITY): Payer: 59

## 2023-08-07 ENCOUNTER — Encounter: Payer: Self-pay | Admitting: Cardiovascular Disease

## 2023-08-07 ENCOUNTER — Inpatient Hospital Stay (HOSPITAL_COMMUNITY): Payer: 59 | Admitting: Certified Registered Nurse Anesthetist

## 2023-08-07 ENCOUNTER — Encounter (HOSPITAL_COMMUNITY): Payer: Self-pay | Admitting: Internal Medicine

## 2023-08-07 ENCOUNTER — Inpatient Hospital Stay (HOSPITAL_COMMUNITY)
Admission: RE | Admit: 2023-08-07 | Discharge: 2023-08-08 | DRG: 307 | Disposition: A | Discharge status: Nursing Home (Includes ICF) | Payer: 59 | Source: Skilled Nursing Facility | Attending: Internal Medicine | Admitting: Internal Medicine

## 2023-08-07 DIAGNOSIS — K219 Gastro-esophageal reflux disease without esophagitis: Secondary | ICD-10-CM | POA: Diagnosis present

## 2023-08-07 DIAGNOSIS — N183 Chronic kidney disease, stage 3 unspecified: Secondary | ICD-10-CM | POA: Diagnosis present

## 2023-08-07 DIAGNOSIS — E1169 Type 2 diabetes mellitus with other specified complication: Secondary | ICD-10-CM | POA: Diagnosis present

## 2023-08-07 DIAGNOSIS — I152 Hypertension secondary to endocrine disorders: Secondary | ICD-10-CM | POA: Diagnosis present

## 2023-08-07 DIAGNOSIS — I255 Ischemic cardiomyopathy: Secondary | ICD-10-CM | POA: Diagnosis present

## 2023-08-07 DIAGNOSIS — Z8249 Family history of ischemic heart disease and other diseases of the circulatory system: Secondary | ICD-10-CM

## 2023-08-07 DIAGNOSIS — Z955 Presence of coronary angioplasty implant and graft: Secondary | ICD-10-CM

## 2023-08-07 DIAGNOSIS — I472 Ventricular tachycardia, unspecified: Secondary | ICD-10-CM | POA: Diagnosis not present

## 2023-08-07 DIAGNOSIS — Z01810 Encounter for preprocedural cardiovascular examination: Secondary | ICD-10-CM | POA: Diagnosis not present

## 2023-08-07 DIAGNOSIS — Z7902 Long term (current) use of antithrombotics/antiplatelets: Secondary | ICD-10-CM

## 2023-08-07 DIAGNOSIS — Z79899 Other long term (current) drug therapy: Secondary | ICD-10-CM

## 2023-08-07 DIAGNOSIS — E1151 Type 2 diabetes mellitus with diabetic peripheral angiopathy without gangrene: Secondary | ICD-10-CM | POA: Diagnosis present

## 2023-08-07 DIAGNOSIS — F419 Anxiety disorder, unspecified: Secondary | ICD-10-CM | POA: Diagnosis present

## 2023-08-07 DIAGNOSIS — E1122 Type 2 diabetes mellitus with diabetic chronic kidney disease: Secondary | ICD-10-CM | POA: Diagnosis present

## 2023-08-07 DIAGNOSIS — Z8673 Personal history of transient ischemic attack (TIA), and cerebral infarction without residual deficits: Secondary | ICD-10-CM

## 2023-08-07 DIAGNOSIS — M159 Polyosteoarthritis, unspecified: Secondary | ICD-10-CM | POA: Diagnosis present

## 2023-08-07 DIAGNOSIS — Z5982 Transportation insecurity: Secondary | ICD-10-CM

## 2023-08-07 DIAGNOSIS — I5022 Chronic systolic (congestive) heart failure: Secondary | ICD-10-CM | POA: Diagnosis present

## 2023-08-07 DIAGNOSIS — Z1152 Encounter for screening for COVID-19: Secondary | ICD-10-CM | POA: Diagnosis not present

## 2023-08-07 DIAGNOSIS — E7800 Pure hypercholesterolemia, unspecified: Secondary | ICD-10-CM | POA: Diagnosis present

## 2023-08-07 DIAGNOSIS — F17210 Nicotine dependence, cigarettes, uncomplicated: Secondary | ICD-10-CM | POA: Diagnosis present

## 2023-08-07 DIAGNOSIS — Z823 Family history of stroke: Secondary | ICD-10-CM

## 2023-08-07 DIAGNOSIS — G8929 Other chronic pain: Secondary | ICD-10-CM | POA: Diagnosis present

## 2023-08-07 DIAGNOSIS — I340 Nonrheumatic mitral (valve) insufficiency: Secondary | ICD-10-CM

## 2023-08-07 DIAGNOSIS — Z91041 Radiographic dye allergy status: Secondary | ICD-10-CM

## 2023-08-07 DIAGNOSIS — I252 Old myocardial infarction: Secondary | ICD-10-CM | POA: Diagnosis not present

## 2023-08-07 DIAGNOSIS — N189 Chronic kidney disease, unspecified: Secondary | ICD-10-CM | POA: Diagnosis present

## 2023-08-07 DIAGNOSIS — I130 Hypertensive heart and chronic kidney disease with heart failure and stage 1 through stage 4 chronic kidney disease, or unspecified chronic kidney disease: Secondary | ICD-10-CM | POA: Diagnosis present

## 2023-08-07 DIAGNOSIS — Z7984 Long term (current) use of oral hypoglycemic drugs: Secondary | ICD-10-CM

## 2023-08-07 DIAGNOSIS — E118 Type 2 diabetes mellitus with unspecified complications: Secondary | ICD-10-CM | POA: Diagnosis present

## 2023-08-07 DIAGNOSIS — Z86711 Personal history of pulmonary embolism: Secondary | ICD-10-CM | POA: Diagnosis not present

## 2023-08-07 DIAGNOSIS — I2510 Atherosclerotic heart disease of native coronary artery without angina pectoris: Secondary | ICD-10-CM | POA: Diagnosis present

## 2023-08-07 DIAGNOSIS — Z720 Tobacco use: Secondary | ICD-10-CM | POA: Diagnosis present

## 2023-08-07 DIAGNOSIS — N179 Acute kidney failure, unspecified: Secondary | ICD-10-CM | POA: Diagnosis present

## 2023-08-07 DIAGNOSIS — Z538 Procedure and treatment not carried out for other reasons: Secondary | ICD-10-CM | POA: Diagnosis not present

## 2023-08-07 DIAGNOSIS — Z555 Less than a high school diploma: Secondary | ICD-10-CM

## 2023-08-07 DIAGNOSIS — J439 Emphysema, unspecified: Secondary | ICD-10-CM | POA: Diagnosis present

## 2023-08-07 DIAGNOSIS — Z95810 Presence of automatic (implantable) cardiac defibrillator: Secondary | ICD-10-CM | POA: Diagnosis not present

## 2023-08-07 DIAGNOSIS — I10 Essential (primary) hypertension: Secondary | ICD-10-CM | POA: Diagnosis present

## 2023-08-07 DIAGNOSIS — I5042 Chronic combined systolic (congestive) and diastolic (congestive) heart failure: Secondary | ICD-10-CM | POA: Diagnosis present

## 2023-08-07 DIAGNOSIS — Z91014 Allergy to mammalian meats: Secondary | ICD-10-CM

## 2023-08-07 HISTORY — PX: TEE WITHOUT CARDIOVERSION: SHX5443

## 2023-08-07 HISTORY — PX: MITRAL VALVE REPAIR: CATH118311

## 2023-08-07 LAB — ECHOCARDIOGRAM LIMITED
Height: 66.5 in
Radius: 0.5 cm
Weight: 2452.8 [oz_av]

## 2023-08-07 LAB — GLUCOSE, CAPILLARY
Glucose-Capillary: 101 mg/dL — ABNORMAL HIGH (ref 70–99)
Glucose-Capillary: 129 mg/dL — ABNORMAL HIGH (ref 70–99)

## 2023-08-07 LAB — TYPE AND SCREEN
ABO/RH(D): A POS
Antibody Screen: NEGATIVE

## 2023-08-07 LAB — SARS CORONAVIRUS 2 BY RT PCR: SARS Coronavirus 2 by RT PCR: NEGATIVE

## 2023-08-07 LAB — ECHO TEE

## 2023-08-07 LAB — PROTIME-INR
INR: 1.4 — ABNORMAL HIGH (ref 0.8–1.2)
Prothrombin Time: 17.6 s — ABNORMAL HIGH (ref 11.4–15.2)

## 2023-08-07 LAB — SURGICAL PCR SCREEN
MRSA, PCR: NEGATIVE
Staphylococcus aureus: NEGATIVE

## 2023-08-07 LAB — ABO/RH: ABO/RH(D): A POS

## 2023-08-07 SURGERY — MITRAL VALVE REPAIR
Anesthesia: General

## 2023-08-07 MED ORDER — DIGOXIN 125 MCG PO TABS
0.1250 mg | ORAL_TABLET | Freq: Every day | ORAL | Status: DC
  Administered 2023-08-07 – 2023-08-08 (×2): 0.125 mg via ORAL
  Filled 2023-08-07 (×2): qty 1

## 2023-08-07 MED ORDER — POTASSIUM CHLORIDE CRYS ER 10 MEQ PO TBCR
10.0000 meq | EXTENDED_RELEASE_TABLET | ORAL | Status: DC
  Administered 2023-08-08: 10 meq via ORAL
  Filled 2023-08-07 (×2): qty 1

## 2023-08-07 MED ORDER — ONDANSETRON HCL 4 MG/2ML IJ SOLN
INTRAMUSCULAR | Status: DC | PRN
  Administered 2023-08-07: 4 mg via INTRAVENOUS

## 2023-08-07 MED ORDER — SODIUM CHLORIDE 0.9 % IV SOLN: INTRAVENOUS | Status: DC

## 2023-08-07 MED ORDER — PHENYLEPHRINE HCL-NACL 20-0.9 MG/250ML-% IV SOLN
INTRAVENOUS | Status: DC | PRN
Start: 2023-08-07 — End: 2023-08-07
  Administered 2023-08-07: 25 ug/min via INTRAVENOUS

## 2023-08-07 MED ORDER — GABAPENTIN 100 MG PO CAPS
100.0000 mg | ORAL_CAPSULE | Freq: Two times a day (BID) | ORAL | Status: DC
  Administered 2023-08-07 – 2023-08-08 (×2): 100 mg via ORAL
  Filled 2023-08-07 (×2): qty 1

## 2023-08-07 MED ORDER — CHLORHEXIDINE GLUCONATE 0.12 % MT SOLN
15.0000 mL | Freq: Once | OROMUCOSAL | Status: AC
  Administered 2023-08-07: 15 mL via OROMUCOSAL
  Filled 2023-08-07: qty 15

## 2023-08-07 MED ORDER — CHLORHEXIDINE GLUCONATE 4 % EX SOLN: 60.0000 mL | Freq: Once | CUTANEOUS | Status: DC

## 2023-08-07 MED ORDER — PHENYLEPHRINE 80 MCG/ML (10ML) SYRINGE FOR IV PUSH (FOR BLOOD PRESSURE SUPPORT)
PREFILLED_SYRINGE | INTRAVENOUS | Status: DC | PRN
Start: 2023-08-07 — End: 2023-08-07
  Administered 2023-08-07 (×3): 80 ug via INTRAVENOUS

## 2023-08-07 MED ORDER — ROCURONIUM BROMIDE 50 MG/5ML IV SOSY
PREFILLED_SYRINGE | INTRAVENOUS | Status: DC | PRN
  Administered 2023-08-07: 80 mg via INTRAVENOUS

## 2023-08-07 MED ORDER — ORAL CARE MOUTH RINSE: 15.0000 mL | OROMUCOSAL | Status: DC | PRN

## 2023-08-07 MED ORDER — LACTATED RINGERS IV SOLN
INTRAVENOUS | Status: DC | PRN
Start: 2023-08-07 — End: 2023-08-07

## 2023-08-07 MED ORDER — ETOMIDATE 2 MG/ML IV SOLN
INTRAVENOUS | Status: DC | PRN
Start: 2023-08-07 — End: 2023-08-07
  Administered 2023-08-07: 4 mg via INTRAVENOUS
  Administered 2023-08-07: 16 mg via INTRAVENOUS

## 2023-08-07 MED ORDER — TRAMADOL HCL 50 MG PO TABS
50.0000 mg | ORAL_TABLET | Freq: Four times a day (QID) | ORAL | Status: DC | PRN
  Administered 2023-08-07: 50 mg via ORAL
  Filled 2023-08-07: qty 1

## 2023-08-07 MED ORDER — SODIUM CHLORIDE 0.9% FLUSH
3.0000 mL | Freq: Two times a day (BID) | INTRAVENOUS | Status: DC
  Administered 2023-08-07 – 2023-08-08 (×3): 3 mL via INTRAVENOUS

## 2023-08-07 MED ORDER — FENTANYL CITRATE (PF) 100 MCG/2ML IJ SOLN
INTRAMUSCULAR | Status: AC
  Filled 2023-08-07: qty 2

## 2023-08-07 MED ORDER — APIXABAN 5 MG PO TABS
5.0000 mg | ORAL_TABLET | Freq: Two times a day (BID) | ORAL | Status: DC
  Administered 2023-08-07 – 2023-08-08 (×2): 5 mg via ORAL
  Filled 2023-08-07 (×2): qty 1

## 2023-08-07 MED ORDER — EMPAGLIFLOZIN 10 MG PO TABS
10.0000 mg | ORAL_TABLET | Freq: Every day | ORAL | Status: DC
  Administered 2023-08-07 – 2023-08-08 (×2): 10 mg via ORAL
  Filled 2023-08-07 (×2): qty 1

## 2023-08-07 MED ORDER — DEXAMETHASONE SODIUM PHOSPHATE 10 MG/ML IJ SOLN
INTRAMUSCULAR | Status: DC | PRN
  Administered 2023-08-07: 4 mg via INTRAVENOUS

## 2023-08-07 MED ORDER — FENTANYL CITRATE (PF) 100 MCG/2ML IJ SOLN: 25.0000 ug | INTRAMUSCULAR | Status: DC | PRN

## 2023-08-07 MED ORDER — FENTANYL CITRATE (PF) 100 MCG/2ML IJ SOLN
INTRAMUSCULAR | Status: DC | PRN
Start: 2023-08-07 — End: 2023-08-07
  Administered 2023-08-07 (×2): 50 ug via INTRAVENOUS

## 2023-08-07 MED ORDER — AMISULPRIDE (ANTIEMETIC) 5 MG/2ML IV SOLN: 10.0000 mg | Freq: Once | INTRAVENOUS | Status: DC | PRN

## 2023-08-07 MED ORDER — ONDANSETRON HCL 4 MG/2ML IJ SOLN: 4.0000 mg | Freq: Four times a day (QID) | INTRAMUSCULAR | Status: DC | PRN

## 2023-08-07 MED ORDER — LIDOCAINE 2% (20 MG/ML) 5 ML SYRINGE
INTRAMUSCULAR | Status: DC | PRN
  Administered 2023-08-07: 60 mg via INTRAVENOUS

## 2023-08-07 MED ORDER — SUGAMMADEX SODIUM 200 MG/2ML IV SOLN
INTRAVENOUS | Status: DC | PRN
  Administered 2023-08-07 (×2): 200 mg via INTRAVENOUS
  Administered 2023-08-07: 600 mg via INTRAVENOUS
  Administered 2023-08-07: 100 mg via INTRAVENOUS

## 2023-08-07 MED ORDER — AMIODARONE HCL 200 MG PO TABS
200.0000 mg | ORAL_TABLET | Freq: Every day | ORAL | Status: DC
  Administered 2023-08-08: 200 mg via ORAL
  Filled 2023-08-07: qty 1

## 2023-08-07 MED ORDER — CEFAZOLIN SODIUM-DEXTROSE 2-4 GM/100ML-% IV SOLN: 2.0000 g | INTRAVENOUS | Status: DC

## 2023-08-07 MED ORDER — PANTOPRAZOLE SODIUM 40 MG PO TBEC
40.0000 mg | DELAYED_RELEASE_TABLET | Freq: Every day | ORAL | Status: DC
  Administered 2023-08-07 – 2023-08-08 (×2): 40 mg via ORAL
  Filled 2023-08-07 (×2): qty 1

## 2023-08-07 MED ORDER — EPHEDRINE SULFATE-NACL 50-0.9 MG/10ML-% IV SOSY
PREFILLED_SYRINGE | INTRAVENOUS | Status: DC | PRN
Start: 2023-08-07 — End: 2023-08-07
  Administered 2023-08-07: 10 mg via INTRAVENOUS
  Administered 2023-08-07: 5 mg via INTRAVENOUS
  Administered 2023-08-07: 10 mg via INTRAVENOUS

## 2023-08-07 MED ORDER — ACETAMINOPHEN 325 MG PO TABS
650.0000 mg | ORAL_TABLET | ORAL | Status: DC | PRN
  Administered 2023-08-07: 650 mg via ORAL
  Filled 2023-08-07: qty 2

## 2023-08-07 MED ORDER — EZETIMIBE 10 MG PO TABS
10.0000 mg | ORAL_TABLET | Freq: Every day | ORAL | Status: DC
  Administered 2023-08-07 – 2023-08-08 (×2): 10 mg via ORAL
  Filled 2023-08-07 (×2): qty 1

## 2023-08-07 SURGICAL SUPPLY — 12 items
CLOSURE PERCLOSE PROSTYLE (VASCULAR PRODUCTS) IMPLANT
KIT HEART LEFT (KITS) ×2 IMPLANT
PACK CARDIAC CATHETERIZATION (CUSTOM PROCEDURE TRAY) ×1 IMPLANT
SHEATH DILAT COONS TAPER 22F (SHEATH) IMPLANT
SHEATH PINNACLE 8F 10CM (SHEATH) IMPLANT
SHEATH PROBE COVER 6X72 (BAG) ×1 IMPLANT
STOPCOCK MORSE 400PSI 3WAY (MISCELLANEOUS) ×6 IMPLANT
TRANSDUCER W/STOPCOCK (MISCELLANEOUS) ×1 IMPLANT
TUBING ART PRESS 72 MALE/FEM (TUBING) ×1 IMPLANT
WIRE EMERALD 3MM-J .035X150CM (WIRE) IMPLANT
WIRE MICRO SET SILHO 5FR 7 (SHEATH) IMPLANT
WIRE TORQFLEX AUST .018X40CM (WIRE) IMPLANT

## 2023-08-07 NOTE — Interval H&P Note (Signed)
History and Physical Interval Note:  08/07/2023 6:45 AM  Daniel Li  has presented today for surgery, with the diagnosis of severe mitral insufficiency.  The various methods of treatment have been discussed with the patient and family. After consideration of risks, benefits and other options for treatment, the patient has consented to  Procedure(s): MITRAL VALVE REPAIR (N/A) TRANSESOPHAGEAL ECHOCARDIOGRAM (N/A) as a surgical intervention.  The patient's history has been reviewed, patient examined, no change in status, stable for surgery.  I have reviewed the patient's chart and labs.  Questions were answered to the patient's satisfaction.     Orbie Pyo

## 2023-08-07 NOTE — Plan of Care (Signed)
  Problem: Education: Goal: Knowledge of disease or condition will improve Outcome: Progressing Goal: Knowledge of secondary prevention will improve (MUST DOCUMENT ALL) Outcome: Progressing Goal: Knowledge of patient specific risk factors will improve Elta [place] N/A or DELETE if not current risk factor) Outcome: Progressing   Problem: Ischemic Stroke/TIA Tissue Perfusion: Goal: Complications of ischemic stroke/TIA will be minimized Outcome: Progressing   Problem: Coping: Goal: Will verbalize positive feelings about self Outcome: Progressing Goal: Will identify appropriate support needs Outcome: Progressing   Problem: Health Behavior/Discharge Planning: Goal: Ability to manage health-related needs will improve Outcome: Progressing Goal: Goals will be collaboratively established with patient/family Outcome: Progressing   Problem: Self-Care: Goal: Ability to participate in self-care as condition permits will improve Outcome: Progressing Goal: Verbalization of feelings and concerns over difficulty with self-care will improve Outcome: Progressing Goal: Ability to communicate needs accurately will improve Outcome: Progressing   Problem: Nutrition: Goal: Risk of aspiration will decrease Outcome: Progressing Goal: Dietary intake will improve Outcome: Progressing   Problem: Education: Goal: Understanding of cardiac disease, CV risk reduction, and recovery process will improve Outcome: Progressing Goal: Individualized Educational Video(s) Outcome: Progressing   Problem: Activity: Goal: Ability to tolerate increased activity will improve Outcome: Progressing   Problem: Cardiac: Goal: Ability to achieve and maintain adequate cardiovascular perfusion will improve Outcome: Progressing   Problem: Health Behavior/Discharge Planning: Goal: Ability to safely manage health-related needs after discharge will improve Outcome: Progressing   Problem: Education: Goal: Knowledge of General  Education information will improve Description: Including pain rating scale, medication(s)/side effects and non-pharmacologic comfort measures Outcome: Progressing   Problem: Health Behavior/Discharge Planning: Goal: Ability to manage health-related needs will improve Outcome: Progressing   Problem: Clinical Measurements: Goal: Ability to maintain clinical measurements within normal limits will improve Outcome: Progressing Goal: Will remain free from infection Outcome: Progressing Goal: Diagnostic test results will improve Outcome: Progressing Goal: Respiratory complications will improve Outcome: Progressing Goal: Cardiovascular complication will be avoided Outcome: Progressing   Problem: Activity: Goal: Risk for activity intolerance will decrease Outcome: Progressing   Problem: Nutrition: Goal: Adequate nutrition will be maintained Outcome: Progressing   Problem: Coping: Goal: Level of anxiety will decrease Outcome: Progressing   Problem: Elimination: Goal: Will not experience complications related to bowel motility Outcome: Progressing Goal: Will not experience complications related to urinary retention Outcome: Progressing   Problem: Pain Managment: Goal: General experience of comfort will improve Outcome: Progressing   Problem: Safety: Goal: Ability to remain free from injury will improve Outcome: Progressing   Problem: Skin Integrity: Goal: Risk for impaired skin integrity will decrease Outcome: Progressing
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
## 2023-08-07 NOTE — Progress Notes (Signed)
Pt arrived from Cath Lab 7. Mitraclip procedure aborted after induction and intubation. Pt alert and oriented x4. VSS. Q43min vitals during recovery phase, see vitals flowsheet. Pt pulse present. No access site was performed. Recovery completed, pt to be admitted overnight for monitoring and due to no one at home and no ride. Pt resting comfortably. Will continue to monitor.

## 2023-08-07 NOTE — Progress Notes (Signed)
Daniel Li. Jude rep stated he was here and ready for patient when needed.

## 2023-08-07 NOTE — Discharge Summary (Incomplete)
[facility]   MULTIDISCIPLINARY HEART VALVE TEAM  Discharge Summary    Patient ID: Daniel Li MRN: [id number]; DOB: September 29, 1956  Admit date: 08/07/2023 Discharge date: 08/07/2023  Primary Care Provider: Ignatius Specking, MD  Primary Cardiologist: Dina Rich, MD / Dr. Shirlee Latch, MD  Discharge Diagnoses    Principal Problem:   Moderate mitral insufficiency Active Problems:   Diabetes mellitus type 2 with complications (HCC)   Benign essential HTN   CAD S/P percutaneous coronary angioplasty   Hypercholesterolemia   Tobacco abuse   Ischemic cardiomyopathy   Chronic combined systolic (congestive) and diastolic (congestive) heart failure (HCC)   ICD (implantable cardioverter-defibrillator) in place   Acute on chronic renal failure (HCC)   Mitral regurgitation  Allergies Allergies  Allergen Reactions   Contrast Media [Iodinated Contrast Media] Other (See Comments)    Does not eat for religious reasons - okay with using IV heparin   Pork-Derived Products Other (See Comments)    Does not eat for religious reasons - okay with using IV heparin    Diagnostic Studies/Procedures   TEE 08/07/23 IMPRESSIONS   1. Mitral Clip procedure cancelled due to non severe MR funcitonal.   2. Left ventricular ejection fraction, by estimation, is 20 to 25%. The  left ventricle has severely decreased function. The left ventricle  demonstrates global hypokinesis. The left ventricular internal cavity size  was severely dilated.   3. Right ventricular systolic function is normal. The right ventricular  size is normal.   4. Left atrial size was severely dilated. No left atrial/left atrial  appendage thrombus was detected.   5. Right atrial size was mildly dilated.   6. Prolapse of P2/P3 dilated annulus With current loading conditions in  OR and fluids BP 150/mmHg systolic MR only appeared mild with  predominantly forward systolic flow in PV;s . The mitral valve is  abnormal.  Mild mitral valve regurgitation.   7. The aortic valve is tricuspid. Aortic valve regurgitation is not  visualized. No aortic stenosis is present.   History of Present Illness     Daniel Li is a 67 y.o. male with a history of chronic HFrEF with EF 20-25% s/p St. Jude ICD (2018), CAD with multiple previous interventions, DMII, PAD, HLD, CVA, HTN, HLD, CKD stage II, hx of PE (02/2023), hx of cocaine/tobacco use, and severe Carpentier class IIIB mitral regurgitation who presented to [facility] on 08/07/2023 for planned mTEER.   Daniel Li has a very complex cardiac history. He underwent cardiac stenting as far back as 2007 with BMS to D1 and OM vessels then BMS to RCA in 2013. He presented with a STEMI in 01/2017 treated with DES to the proximal LAD, NSTEMI 02/11/2018 with cath showing patent LAD and diagonal 1 stents with progression of CAD; however recommendations were for continued medical management. At that time, he was noted to have ischemic cardiomyopathy with an EF at 20 to 25%. He was found to have an LV thrombus 05/2017 and was started on Coumadin although this resolved on subsequent echo from 01/2018. He had St. Jude ICD placed 08/2017 and was followed by Dr. Johney Frame.   Due to MR noted on echocardiogram, he underwent TEE and RHC 03/22/22 as part of workup for consideration of MitraClip. TEE with evidence of moderate to severe MR. RHC with elevated PCWP and prominent v-waves suggestive of severe MR, normal RA pressure, pulmonary venous HTN, low CO. Post procedure he was admitted for observation and was seen in consultation  by the structural heart team. Due to very poor dentition he was referred for dental extractions.   In 01/2023, he was diagnosed with PE at [facility] and started on apixaban.    Echo in 7/24 showed EF 20%, slow flow at apex, severe LV dilation, mild RV dysfunction, mod-severe MR, IVC normal.   His mTEER was delayed due to need for dental extractions, transportation issues  from group home and the development of recent PNA.   He was last seen on 07/31/2023 by Dr. Lynnette Caffey and felt to be stable from a cardiac and medical standpoint and mTEER scheduled for 08/07/23. He underwent device clearance 10/4 and presented 10/9/204 for his procedure.   Hospital Course      Mitral regurgitation: pre procedure TEE showed mild to moderate mitral regurgitation even with elevating systolic blood pressure 250 mmHg and elective mitral transcatheter edge to edge repair was aborted. Initial plan was for discharge home, but the patient did not have transportation and was kept overnight for recovery and observation.    ICM/Chronic HFrEF: s/p St Jude ICD. Appears euvolemic. Resumed on home SGLT2 and digoxin. Hold Entresto/spiro/torsemide until BP improves as he wakes up from sedation.    CAD: extensive prior history of CAD.  He has known total occlusion LCx and PLV.  NSTEMI 10/19 with DES to mid LAD.  Coronary angiography in 9/21 showed stable coronary disease, no intervention was done.  Admission 1/23 to [facility] concerning for ACS, however LHC showed patent stents, no new stents placed. No recent ischemic-type chest pain. Per Dr. Alford Highland last note: "given apixaban use and stable CAD, stop ASA." Aspirin remains on his med list so I will discontinue this now.  No statin given history of rhabdomyolysis on statin. Continue on Repatha and Zetia.    DMT2: resumed on home SGLT2i.    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 Continued on Eliquis 5mg  BID   Polysubstance abuse: UDS + for cocaine in 11/2022 but abstaining from alcohol.    NSVT: now off Coreg with marginal output and soft BP.  Continue amiodarone at 200 mg daily.    PE: diagnosed in 02/2023, on apixaban (continue long-term).    HTN: BP is soft. Review of last office note showed BP in 80s/60s. Will carefully add back GDMT.  Consultants: none    _____________  Discharge Vitals Blood pressure 105/79, pulse 64, temperature  97.9 F (36.6 C), temperature source Oral, resp. rate 19, height 5' 6.5" (1.689 m), weight 69.5 kg, SpO2 99%.  Filed Weights   08/07/23 0658  Weight: 69.5 kg     GEN: Well nourished, well developed, in no acute distress HEENT: normal Neck: no JVD or masses Cardiac: RRR; soft HSM at apex. no rubs, or gallops,no edema  Respiratory:  clear to auscultation bilaterally, normal work of breathing GI: soft, nontender, nondistended, + BS MS: no deformity or atrophy Skin: warm and dry, no rash Neuro:  Alert and Oriented x 3, Strength and sensation are intact Psych: euthymic mood, full affect    Disposition   Pt is being discharged home today in good condition.  Follow-up Plans & Appointments     Follow-up Information     Laurey Morale, MD. Go on 08/16/2023.   Specialty: Cardiology Why: @ 10am to see one of Dr. Alford Highland NP/PAs Contact information: [address] [place] [address] [phone]                  Discharge Medications   Allergies  as of 08/07/2023       Reactions   Contrast Media [iodinated Contrast Media] Other (See Comments)   Does not eat for religious reasons - okay with using IV heparin   Pork-derived Products Other (See Comments)   Does not eat for religious reasons - okay with using IV heparin     Med Rec must be completed prior to using this SMARTLINK***           Outstanding Labs/Studies   ***  Duration of Discharge Encounter   Greater than 30 minutes including physician time.  Signed, Cline Crock, PA-C 08/07/2023, 1:20 PM [phone]

## 2023-08-07 NOTE — Anesthesia Postprocedure Evaluation (Signed)
Anesthesia Post Note  Patient: Vivia Ewing Standifer  Procedure(s) Performed: MITRAL VALVE REPAIR TRANSESOPHAGEAL ECHOCARDIOGRAM     Patient location during evaluation: PACU Anesthesia Type: General Level of consciousness: awake and alert Pain management: pain level controlled Vital Signs Assessment: post-procedure vital signs reviewed and stable Respiratory status: spontaneous breathing, nonlabored ventilation, respiratory function stable and patient connected to nasal cannula oxygen Cardiovascular status: blood pressure returned to baseline and stable Postop Assessment: no apparent nausea or vomiting Anesthetic complications: no Comments: - case canceled post induction after intra-op TEE revealed mild MR   There were no known notable events for this encounter.  Last Vitals:  Vitals:   08/07/23 1300 08/07/23 1400  BP: 105/79 103/88  Pulse: 64   Resp: 19 (!) 25  Temp:    SpO2: 99% 99%    Last Pain:  Vitals:   08/07/23 1400  TempSrc:   PainSc: 0-No pain                 Earl Lites P Morton Simson

## 2023-08-07 NOTE — Transfer of Care (Signed)
Immediate Anesthesia Transfer of Care Note  Patient: Daniel Li  Procedure(s) Performed: MITRAL VALVE REPAIR TRANSESOPHAGEAL ECHOCARDIOGRAM  Patient Location: PACU  Anesthesia Type:General  Level of Consciousness: awake and patient cooperative  Airway & Oxygen Therapy: Patient Spontanous Breathing  Post-op Assessment: Report given to RN and Post -op Vital signs reviewed and stable  Post vital signs: Reviewed and stable  Last Vitals:  Vitals Value Taken Time  BP 98/65 08/07/23 0930  Temp    Pulse 66 08/07/23 0934  Resp 16 08/07/23 0934  SpO2 94 % 08/07/23 0934  Vitals shown include unfiled device data.  Last Pain:  Vitals:   08/07/23 0838  TempSrc:   PainSc: 0-No pain      Patients Stated Pain Goal: 0 (08/07/23 0745)  Complications: There were no known notable events for this encounter.

## 2023-08-07 NOTE — Progress Notes (Signed)
PERIOPERATIVE PRESCRIPTION FOR IMPLANTED CARDIAC DEVICE PROGRAMMING  Patient Information: Name:  Daniel Li  DOB:  01/11/1956  MRN:  [id number]  Planned Procedure:  Mitral valve repair TEE  Surgeon:  Dr. Lynnette Caffey  Date of Procedure:  08-07-23  Cautery will be used.  Position during surgery:  supine   Device Information:  Clinic EP Physician:  Dr. York Pellant  Device Type:  Defibrillator Manufacturer and Phone #:  St. Jude/Abbott: [phone] Pacemaker Dependent?:  No. Date of Last Device Check:  08/05/2023 Normal Device Function?:  Yes.    Electrophysiologist's Recommendations:  Have magnet available. Provide continuous ECG monitoring when magnet is used or reprogramming is to be performed.  Procedure will likely interfere with device function.  Device should be programmed:  Tachy therapies disabled  Per Device Clinic Standing Orders Wiliam Ke, RN  4:52 PM 08/07/2023

## 2023-08-08 ENCOUNTER — Encounter (HOSPITAL_COMMUNITY): Payer: Self-pay | Admitting: Internal Medicine

## 2023-08-08 DIAGNOSIS — I340 Nonrheumatic mitral (valve) insufficiency: Secondary | ICD-10-CM | POA: Diagnosis not present

## 2023-08-08 NOTE — Progress Notes (Signed)
Patient given discharge paperwork, IV's removed. Home transport called, no answer, left message.

## 2023-08-08 NOTE — TOC Transition Note (Signed)
Transition of Care [facility]) - CM/SW Discharge Note   Patient Details  Name: Daniel Li MRN: [id number] Date of Birth: [birth year]  Transition of Care [facility] CM/SW Contact:  Deatra Robinson, LCSW Phone Number: 08/08/2023, 10:31 AM   Clinical Narrative: pt for dc back to [facility]. Spoke to Trinidad at [facility] [phone] who confirmed pt able to return. Thurston Hole requested any new meds be sent to [facility] in [place] and dc summary be faxed to [phone] (fax and phone share line). DC summary faxed. RN has called [facility] for transportation. Pt agreeable to dc plan. SW signing off.   Dellie Burns, MSW, LCSW [phone] (coverage)      Final next level of care: Assisted Living Barriers to Discharge: No Barriers Identified   Patient Goals and CMS Choice      Discharge Placement                  Patient to be transferred to facility by: [facility] Name of family member notified: Pt agreeable to dc Patient and family notified of of transfer: 08/08/23  Discharge Plan and Services Additional resources added to the After Visit Summary for                                       Social Determinants of Health (SDOH) Interventions SDOH Screenings   Food Insecurity: No Food Insecurity (08/07/2023)  Housing: Low Risk  (08/07/2023)  Transportation Needs: Unmet Transportation Needs (08/07/2023)  Utilities: Not At Risk (08/07/2023)  Financial Resource Strain: Low Risk  (02/25/2023)   Received from [facility], [facility]  Tobacco Use: Medium Risk (08/07/2023)     Readmission Risk Interventions     No data to display

## 2023-08-08 NOTE — Progress Notes (Signed)
Transportation called. They will call us with a time they will be here to pick him up.

## 2023-08-08 NOTE — Progress Notes (Signed)
Heart Failure Navigator Progress Note  Assessed for Heart & Vascular TOC clinic readiness.  Patient does not meet criteria due to Advanced Heart Failure Team patient of Dr. Violeta Gelinas.   Navigator will sign off at this time.,   Rhae Hammock, BSN, Scientist, clinical (histocompatibility and immunogenetics) Only

## 2023-08-09 ENCOUNTER — Encounter (HOSPITAL_COMMUNITY): Payer: 59

## 2023-08-09 NOTE — Progress Notes (Signed)
EPIC Encounter for ICM Monitoring  Patient Name: Daniel Li is a 67 y.o. male Date: 08/09/2023 Primary Care Physican: Ignatius Specking, MD Primary Cardiologist: [facility] Electrophysiologist: [facility] 08/10/2022 Office Weight: 140 lbs                                                  Transmission fluid levels reviewed.   Mitral Valve Repair 10/9   CorVue thoracic impedance suggesting normal fluid levels with the exception of possible fluid accumulation from 8/10-8/20 (correlates with pneumonia and bronchitis).      Prescribed dosage:  Torsemide 20 mg Take 1 tablet by mouth every other day Spironolactone 25 mg take 1 tablet (25 mg total) daily   Labs: 07/31/2023 Creatinine 1.21, BUN 9,   Potassium 4.0, Sodium 144  06/19/2023 Creatinine 1.20, BUN 10, Potassium 3.1, Sodium 137, GFR >60  06/17/2023 Creatinine 1.05, BUN 8,   Potassium 3.5, Sodium 143, GFR >60  06/10/2023 Creatinine 1.05, BUN 8,   Potassium 3.5, Sodium 143, GFR >60 03/16/2023 Creatinine 1.12, BUN 10, Potassium 5.2, Sodium 144, GFR 72  A complete set of results can be found in Results Review.   Recommendations:  No changes.    Follow-up plan: ICM clinic phone appointment on 09/09/2023.    91 day device clinic remote transmission 08/12/2023.     EP/Cardiology Office Visits:   08/16/2023 with HF clinic.   07/31/2023 with Dr Lynnette Caffey.   Recall 04/04/2023 with Ulyses Jarred, PA.     Copy of ICM check sent to Dr. Nelly Laurence.   3 month ICM trend: 08/05/2023.    12-14 Month ICM trend:     Karie Soda, RN 08/09/2023 4:22 PM

## 2023-08-12 ENCOUNTER — Ambulatory Visit (INDEPENDENT_AMBULATORY_CARE_PROVIDER_SITE_OTHER): Payer: 59

## 2023-08-12 DIAGNOSIS — I5042 Chronic combined systolic (congestive) and diastolic (congestive) heart failure: Secondary | ICD-10-CM | POA: Diagnosis not present

## 2023-08-14 LAB — CUP PACEART REMOTE DEVICE CHECK
Battery Remaining Longevity: 53 mo
Battery Remaining Percentage: 50 %
Battery Voltage: 2.95 V
Brady Statistic RV Percent Paced: 1 %
Date Time Interrogation Session: 20241014020028
HighPow Impedance: 59 Ohm
HighPow Impedance: 59 Ohm
Implantable Lead Connection Status: 753985
Implantable Lead Implant Date: 20181120
Implantable Lead Location: 753860
Implantable Pulse Generator Implant Date: 20181120
Lead Channel Impedance Value: 360 Ohm
Lead Channel Pacing Threshold Amplitude: 1.25 V
Lead Channel Pacing Threshold Pulse Width: 0.5 ms
Lead Channel Sensing Intrinsic Amplitude: 10.6 mV
Lead Channel Setting Pacing Amplitude: 2.5 V
Lead Channel Setting Pacing Pulse Width: 0.5 ms
Lead Channel Setting Sensing Sensitivity: 0.5 mV
Pulse Gen Serial Number: 9786940
Zone Setting Status: 755011

## 2023-08-14 NOTE — Progress Notes (Signed)
ADVANCED HF CLINIC NOTE   PCP: Ignatius Specking, MD Primary Cardiologist: Dr. Wyline Mood HF: Dr. Shirlee Latch  HPI: Daniel Li is a 67 y.o. male with long history of CAD s/p multiple ACS episodes, HTN, HLD, tobacco abuse, DM, OSA, CVA and systolic CHF.    He has a history of CAD with NSTEMI in 2007 with BMS placement to his D1 and OM2. Also with history of STEMI in May 2013 at which time he had a BMS placed to his RCA. NSTEMI in January of 2014, cath showed CTO of left circumflex, unable to intervene upon. EF 55-60% in September 2016. Anterior STEMI 4/18, DES to pLAD.    Admitted 8/9 - 06/11/17 with chest pain. No targets for PCI, LAD stent patent. EF remained low at 20-25% as below. Now with St Jude ICD.    Admitted 4/16 - 02/17/2018 with NSTEMI. Underwent LHC again with unchanged anatomy, no intervention. Discharged back to assisted living.    He was admitted again in 10/19 with NSTEMI.  LHC showed totally occluded PLV, 75% PDA stenosis, 80% mid LAD, occluded LCx.  He had DES to mLAD. Repeat echo showed EF 20-25%.  RHC/LHC in 9/21 showed unchanged coronaries with occluded PLV and proximal LCx with collaterals, patent LAD, 60% D2, 60% PDA. Normal filling pressures and relatively preserved cardiac output.    Echo in 5/22 showed EF 25% with mild LV dilation, no LV thrombus, normal RV, mild MR.    Admitted 11/05/21 at [facility] for hypotension and AKI. Echo ordered but not completed. Received IVF and beta blocker and losartan stopped at discharge.    Admitted 11/20/21 to [facility] with CP, concern for ACS. R/LHC with 3-vessel disease with patent stents (no intervention) CO 3.0 and low PWCP at 7 mmHg. Echo showed EF~25%, no evidence of thrombus, warfarin stopped and he was transitioned to Plavix. Dig, losartan and spiro stopped due to AKI. PYP scan obtained and showed no evidence of TTR amyloidosis.    Admitted to [facility] 2/23 with CP and fall. Ethanol on admission 184. Troponin elevated but felt to be in setting of  ETOH use and fall.    Echo 4/23: EF 20-25% with mild LV dilation, no LV thrombus, moderate-severe MR with ERO 0.39 cm^2 by PISA, normal RV, PASP 49.    Presented for TEE and RHC on 03/22/22 as part of workup for consideration of MitraClip. TEE with evidence of moderate to severe MR. RHC with elevated PCWP and prominent v-waves suggestive of severe MR, normal RA pressure, pulmonary venous HTN, low CO. He was direct admitted for overnight observation, given he has no one at home to stay with him post procedure. While admitted, structural heart team started assessment for MitraClip. Given marginal CO on cath and soft BP, Coreg was discontinued. Digoxin 0.125 mg added. He was noted to have 15 beat run of NSVT on tele. He was started on amiodarone.   He was admitted in 2/24 with right-sided chest pain, workup was negative.  UDS was + for cocaine though he denied use. Echo in 2/24 showed EF 20%, severe LV dilation, slow flow at apex w/o frank clot, RV normal, at least moderate MR, IVC normal.   In 5/24, he was diagnosed with PE at [facility] and started on apixaban.   Echo in 7/24 showed EF 20%, slow flow at apex, severe LV dilation, mild RV dysfunction, mod-severe MR, IVC normal.   Patient has been seen by Dr. Lynnette Caffey with plan for Mitraclip.  He  has had dental evaluation and tooth removal that needed to be done pre-Mitraclip.  Evaluation has been very delayed due to extreme difficulty getting transportation to appointments from his group home.   TEE (10/24): LVEF 20-25%, RV normal, mild MR   Presented for mTEER 10/24, however found no significant MR on TEE, despite multiple TTEs suggestive otherwise. Procedure aborted and he was discharged.  Today he returns for HF follow up. Overall feeling fine. He has occasional SOB walking further distances on flat ground. Denies overt palpitations, but feels occasional "lift" under left pec muscle. Denies CP, dizziness, edema, or PND/Orthopnea. Appetite ok. No  fever or chills. Weight at home 154 pounds. Taking all medications. No smoking, no drugs or ETOH.  ECG (personally reviewed from 08/08/23): NSR, 1AVB 60 bpm  Labs (6/23): K 4.4, creatinine 1.46, LFTs normal, TSH normal, Lp (a) 232 Labs (2/24): LDL 45, BNP 171, K 4.3, creatinine 1.33 Labs (4/24): K 4.1, SCr 1.1 Labs (5/24): K 5.2, creatinine 1.12 Labs (7/24): K 4.7, creatininine 1.31 Labs (8/24): LDL 144, LFTs normal, TSH normal Labs (10/24): K 4, creatinine 1.21  St Jude device interrogation: no VT, stable thoracic impedance, < 1% VP  PMH: 1. OSA: Supposed to be using CPAP.  2. HTN 3. CAD: Long history.  - NSTEMI 2007 with BMS D1 and OM2. - STEMI 5/13 with BMS RCA - NSTEMI 1/14 with CTO LCx and PLV, unable to open LCx.  - STEMI 4/18 with LHC showing old TO LCx with collaterals, old TO PLV, 70% ostial D1, totally occluded proximal LAD with some collaterals => DES to LAD.  - LHC 8/18 with old TO LCx, old TO PLV, 60% mLAD, patent LAD stent.  - LHC 4/19 with totally occluded PLV, totally occluded LCx, patent LAD and D1 stents, 50% mid LAD.  - NSTEMI 10/19. LHC with totally occluded PLV, totally occluded LCx, patent proximal LAD and D1 stents, 80% mid LAD stenosis => DES to mid LAD.  - LHC (9/21): occluded PLV and proximal LCx with collaterals, patent LAD, 60% D2, 60% PDA.  No intervention.  - LHC (1/23, [facility]): significant 3-vessel disease with patent stents; no further stents were placed 4. Depression 5. Type II DM 6. Hyperlipidemia: History of rhabdomyolysis with statins, he is on Zetia.  7. Chronic systolic CHF: Ischemic cardiomyopathy.  Echo from 9/16 showed improvement in EF to 55-60%.  St Jude ICD.  - Echo (4/18) with EF 20-25%, dyskinetic apex, moderate-severe MR, severe LAE.  - Echo (8/18) with EF 20-25%, moderate to severe MR - Echo (4/19) with EF 20-25%  - Echo (10/19) with EF 20-25%, no LV thrombus.  - RHC (10/19): mean RA 4, PA 22/7, mean PCWP 7, CI 2.3 - RHC (9/21): mean RA  1, PA 18/5, mean PCWP 7, CI 2.12 (F)/2.17(T) - Echo (5/22): EF 25% with mild LV dilation, no LV thrombus, normal RV, mild MR. - RHC (1/23): CO 3.0 and low PWCP at 7 mmHg - Echo (1/23): EF 25% - Echo (4/23): EF 20-25% with mild LV dilation, no LV thrombus, moderate-severe MR with ERO 0.39 cm^2 by PISA, normal RV, PASP 49.  - Echo (2/24): EF 20%, severe LV dilation, slow flow at apex w/o frank clot, RV normal, at least moderate MR, IVC normal.  - Echo (7/24): EF 20%, slow flow at apex, severe LV dilation, mild RV dysfunction, mod-severe MR, IVC normal.  - TEE (10/24): LVEF 20-25%, RV normal, mild MR  8. PAD: - ABIs (4/18) with R ABI of  0.84 suggestive of mild disease.  L ABI of 1.27 (Normal flow at rest) - ABIs (5/19): abnormal TBI on right.  - ABIs (10/21): Normal 9. LV thrombus 10. H/o CVA 11. Mitral regurgitation: Likely functional. Echo in 4/23 with moderate-severe MR. EE (5/23): EF 20-25%, RV mildly reduced, no LAA, severe MR with no mitral stenosis, MV mean gradient 2.0 mmHg - TEE (5/23): EF 20-25%, mild RV dysfunction, severe MR.  - Echo (7/24): moderate-severe MR - TEE (10/24): LVEF 20-25%, RV normal, mild MR  12. PE: Diagnosed [facility] in 5/24.   ROS: All systems negative except as listed in HPI, PMH and Problem List.  SH:  Social History   Socioeconomic History   Marital status: Single    Spouse name: Not on file   Number of children: 0   Years of education: 9   Highest education level: 9th grade  Occupational History   Not on file  Tobacco Use   Smoking status: Former    Current packs/day: 0.50    Average packs/day: 0.5 packs/day for 46.1 years (23.0 ttl pk-yrs)    Types: Cigarettes, Cigars    Start date: 07/21/1977   Smokeless tobacco: Never  Vaping Use   Vaping status: Never Used  Substance and Sexual Activity   Alcohol use: Yes    Comment: 09/17/2017 "nothing since 05/2017"   Drug use: Not Currently    Comment: previously incarcerated for drug related  offense.   Sexual activity: Not Currently  Other Topics Concern   Not on file  Social History Narrative   Not on file   Social Determinants of Health   Financial Resource Strain: Low Risk  (02/25/2023)   Received from [facility]   Overall Financial Resource Strain (CARDIA)    Difficulty of Paying Living Expenses: Not hard at all  Food Insecurity: No Food Insecurity (08/07/2023)   Hunger Vital Sign    Worried About Running Out of Food in the Last Year: Never true    Ran Out of Food in the Last Year: Never true  Transportation Needs: Unmet Transportation Needs (08/07/2023)   PRAPARE - Administrator, Civil Service (Medical): Yes    Lack of Transportation (Non-Medical): Yes  Physical Activity: Not on file  Stress: Not on file  Social Connections: Not on file  Intimate Partner Violence: Not At Risk (08/07/2023)   Humiliation, Afraid, Rape, and Kick questionnaire    Fear of Current or Ex-Partner: No    Emotionally Abused: No    Physically Abused: No    Sexually Abused: No   FH:  Family History  Problem Relation Age of Onset   Stroke Mother    Heart attack Mother    Heart attack Father    Stroke Sister    Heart attack Sister    Heart attack Brother    Stroke Brother    Liver disease Neg Hx    Colon cancer Neg Hx    Current Outpatient Medications  Medication Sig Dispense Refill   acetaminophen (TYLENOL) 325 MG tablet Take 650 mg by mouth every 6 (six) hours as needed for mild pain.     albuterol (VENTOLIN HFA) 108 (90 Base) MCG/ACT inhaler Inhale 2 puffs into the lungs every 4 (four) hours as needed for wheezing or shortness of breath. 1 each 3   amiodarone (PACERONE) 200 MG tablet Take 1 tablet (200 mg total) by mouth daily. 90 tablet 3   apixaban (ELIQUIS) 5 MG TABS tablet  Take 5 mg by mouth 2 (two) times daily.     ASPIRIN 81 PO Take 81 mg by mouth daily.     digoxin (LANOXIN) 0.125 MG tablet TAKE (1) TABLET BY MOUTH ONCE DAILY. 30 tablet 10    empagliflozin (JARDIANCE) 10 MG TABS tablet Take 10 mg by mouth daily.     ezetimibe (ZETIA) 10 MG tablet Take 1 tablet (10 mg total) by mouth daily. 30 tablet 3   gabapentin (NEURONTIN) 100 MG capsule Take 100 mg by mouth 2 (two) times daily.     lidocaine (LIDODERM) 5 % Place 1 patch onto the skin every 12 (twelve) hours. Leave off 12 hours     metFORMIN (GLUCOPHAGE) 500 MG tablet Take 500 mg by mouth daily with breakfast.     nitroGLYCERIN (NITROSTAT) 0.4 MG SL tablet PLACE ONE (1) TABLET UNDER TONGUE EVERY 5 MINUTES UP TO (3) DOSES AS NEEDED FOR CHEST PAIN. IF NO RELIEF, CONTACT MD. 25 tablet 3   pantoprazole (PROTONIX) 40 MG tablet Take 40 mg by mouth daily.     potassium chloride (KLOR-CON) 10 MEQ tablet Take 10 mEq by mouth every other day.     sacubitril-valsartan (ENTRESTO) 24-26 MG Take 1 tablet by mouth 2 (two) times daily.     spironolactone (ALDACTONE) 25 MG tablet Take 1 tablet (25 mg total) by mouth daily. 90 tablet 3   torsemide (DEMADEX) 20 MG tablet TAKE (1) TABLET BY MOUTH ONCE EVERY OTHER DAY. 15 tablet 11   traMADol (ULTRAM) 50 MG tablet Take 50 mg by mouth every 6 (six) hours as needed.     No current facility-administered medications for this encounter.   BP 92/64   Pulse 74   Wt 69.9 kg (154 lb 3.2 oz)   SpO2 97%   BMI 24.89 kg/m   Wt Readings from Last 3 Encounters:  08/16/23 69.9 kg (154 lb 3.2 oz)  08/07/23 71.4 kg (157 lb 6.5 oz)  07/31/23 67.6 kg (149 lb)   PHYSICAL EXAM: General:  NAD. No resp difficulty, walked into clinic HEENT: Normal Neck: Supple. No JVD. Carotids 2+ bilat; no bruits. No lymphadenopathy or thryomegaly appreciated. Cor: PMI nondisplaced. Regular rate & rhythm. No rubs, gallops or murmurs. Lungs: Clear Abdomen: Soft, nontender, nondistended. No hepatosplenomegaly. No bruits or masses. Good bowel sounds. Extremities: No cyanosis, clubbing, rash, edema Neuro: Alert & oriented x 3, cranial nerves grossly intact. Moves all 4  extremities w/o difficulty. Affect pleasant.  ASSESSMENT & PLAN:  1. Chronic systolic CHF: Ischemic cardiomyopathy, St Jude ICD. Echo in 10/19 with EF 20-25%.  Echo in 5/22 with EF 25%, normal RV.  Echo 1/23 showed EF 25%. R/LHC in 1/23 at [facility] showed no new CAD, preserved CO and low PCWP suggesting over-diuresis. PYP scan obtained and did not suggest TTR amyloidosis. Echo 4/23 showed EF 20-25% with mild LV dilation, no LV thrombus, moderate-severe MR with ERO 0.39 cm^2 by PISA, normal RV, PASP 49. RHC in 5/23 showed RA mean 2, PA 49/12 (31), PCWP mean 21 with prominent v waves to 45 suggesting severe MR, Fick CO/CI 3.81/2.18. Echo 7/24 showed EF 20%, slow flow at apex, severe LV dilation, mild RV dysfunction, mod-severe MR, IVC normal. TEE (10/24): LVEF 20-25%, RV normal, mild MR. NYHA class II symptoms, he is not volume overloaded by exam or Corvue.  - Continue digoxin. Dig level 0.3 (06/19/23) - Continue torsemide 20 mg every other day.   - Continue spironolactone 25 mg daily. BMET/BNP today  - Continue  Entresto 24/26 bid  - Continue Jardiance 10 mg daily.  - Beta blocker stopped in 5/23 with borderline low output - BP limits titration of GDMT today. 2. Mitral regurgitation: Echo 04/23 showed moderate-severe likely functional MR.  He may benefit from Mitraclip procedure. TEE 5/23 with severe functional MR with restricted posterior leaflet. Elevated PCWP with prominent V waves on RHC in 5/23 suggestive of severe MR.  Patient has been seen by structural heart team and he has had dental extractions.  Workup has been considerably slowed by difficulty with arranging transportation from his group home. Most recent echo in 7/24 with mod-severe MR.  - Presented for mTEER, however TEE showed only mild MR. Procedure aborted.  3. CAD:  Extensive prior history of CAD.  He has known total occlusion LCx and PLV.  NSTEMI 10/19 with DES to mid LAD.  Coronary angiography in 9/21 showed stable coronary disease, no  intervention was done.  Admission 1/23 to [facility] concerning for ACS, however LHC showed patent stents, no new stents placed. No recent ischemic-type chest pain.  - Given apixaban use and stable CAD, off ASA - No statin given history of rhabdomyolysis on statin.   - Recent LDL 144, he has been referred back to Lipid clinic, has follow up for Leqvio. - Continue Zetia.  4. Diabetes: Taking SGLT2i.  5. Smoking: Quit. Congratulated. 6. PAD: No claudication, foot ulcers. ABIs in 10/21 were normal.    7. LV thrombus:  Echo 4/23 showed no LV thrombus.  Warfarin was stopped during one of his admissions at [facility]. No LV thrombus on TEE (5/23). He was started on apixaban in 5/24 due to PE.  8. 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  - He is on apixaban.  9. ETOH use: No further ETOH use. Congratulated on cessation. 10. NSVT: Now off Coreg with marginal output and soft BP.  - Continue amiodarone at 200 mg daily. Recent LFTs and TSH stable. Needs regular eye exam.  11. Cocaine abuse: He was cocaine positive during 2/24 admission.  Denies current use.  12. PE: Diagnosed in 5/24, on apixaban (continue long-term).   Followup 3 months with Dr. Shirlee Latch.  [facility] FNP-BC 08/16/23

## 2023-08-15 ENCOUNTER — Telehealth (HOSPITAL_COMMUNITY): Payer: Self-pay

## 2023-08-15 NOTE — Telephone Encounter (Signed)
Called and spoke to patients's caregiver Dewayne Hatch to confirm/remind patient of their appointment at the [facility] on 08/16/23.   Patient reminded to bring all medications and/or complete list.  Confirmed patient has transportation. Gave directions, instructed to utilize valet parking.  Confirmed appointment prior to ending call.

## 2023-08-16 ENCOUNTER — Ambulatory Visit (HOSPITAL_COMMUNITY)
Admission: RE | Admit: 2023-08-16 | Discharge: 2023-08-16 | Disposition: A | Discharge status: Home / Self Care | Payer: 59 | Source: Ambulatory Visit | Attending: Family Medicine | Admitting: Family Medicine

## 2023-08-16 ENCOUNTER — Telehealth (HOSPITAL_COMMUNITY): Payer: Self-pay

## 2023-08-16 ENCOUNTER — Encounter (HOSPITAL_COMMUNITY): Payer: Self-pay

## 2023-08-16 VITALS — BP 92/64 | HR 74 | Wt 154.2 lb

## 2023-08-16 DIAGNOSIS — I340 Nonrheumatic mitral (valve) insufficiency: Secondary | ICD-10-CM | POA: Diagnosis not present

## 2023-08-16 DIAGNOSIS — I513 Intracardiac thrombosis, not elsewhere classified: Secondary | ICD-10-CM

## 2023-08-16 DIAGNOSIS — F1011 Alcohol abuse, in remission: Secondary | ICD-10-CM

## 2023-08-16 DIAGNOSIS — F1411 Cocaine abuse, in remission: Secondary | ICD-10-CM | POA: Insufficient documentation

## 2023-08-16 DIAGNOSIS — I110 Hypertensive heart disease with heart failure: Secondary | ICD-10-CM | POA: Diagnosis not present

## 2023-08-16 DIAGNOSIS — Z8673 Personal history of transient ischemic attack (TIA), and cerebral infarction without residual deficits: Secondary | ICD-10-CM | POA: Insufficient documentation

## 2023-08-16 DIAGNOSIS — I739 Peripheral vascular disease, unspecified: Secondary | ICD-10-CM | POA: Diagnosis not present

## 2023-08-16 DIAGNOSIS — E1151 Type 2 diabetes mellitus with diabetic peripheral angiopathy without gangrene: Secondary | ICD-10-CM | POA: Insufficient documentation

## 2023-08-16 DIAGNOSIS — I5020 Unspecified systolic (congestive) heart failure: Secondary | ICD-10-CM | POA: Diagnosis present

## 2023-08-16 DIAGNOSIS — F1410 Cocaine abuse, uncomplicated: Secondary | ICD-10-CM

## 2023-08-16 DIAGNOSIS — Z86711 Personal history of pulmonary embolism: Secondary | ICD-10-CM | POA: Diagnosis not present

## 2023-08-16 DIAGNOSIS — E785 Hyperlipidemia, unspecified: Secondary | ICD-10-CM | POA: Insufficient documentation

## 2023-08-16 DIAGNOSIS — E1159 Type 2 diabetes mellitus with other circulatory complications: Secondary | ICD-10-CM

## 2023-08-16 DIAGNOSIS — I4729 Other ventricular tachycardia: Secondary | ICD-10-CM | POA: Diagnosis not present

## 2023-08-16 DIAGNOSIS — I255 Ischemic cardiomyopathy: Secondary | ICD-10-CM | POA: Insufficient documentation

## 2023-08-16 DIAGNOSIS — I2510 Atherosclerotic heart disease of native coronary artery without angina pectoris: Secondary | ICD-10-CM | POA: Insufficient documentation

## 2023-08-16 DIAGNOSIS — I5022 Chronic systolic (congestive) heart failure: Secondary | ICD-10-CM

## 2023-08-16 DIAGNOSIS — G4733 Obstructive sleep apnea (adult) (pediatric): Secondary | ICD-10-CM | POA: Insufficient documentation

## 2023-08-16 DIAGNOSIS — Z7901 Long term (current) use of anticoagulants: Secondary | ICD-10-CM | POA: Diagnosis not present

## 2023-08-16 DIAGNOSIS — Z87891 Personal history of nicotine dependence: Secondary | ICD-10-CM | POA: Insufficient documentation

## 2023-08-16 DIAGNOSIS — F1091 Alcohol use, unspecified, in remission: Secondary | ICD-10-CM | POA: Diagnosis not present

## 2023-08-16 LAB — CBC
HCT: 48.1 % (ref 39.0–52.0)
Hemoglobin: 15.3 g/dL (ref 13.0–17.0)
MCH: 26.4 pg (ref 26.0–34.0)
MCHC: 31.8 g/dL (ref 30.0–36.0)
MCV: 83.1 fL (ref 80.0–100.0)
Platelets: 307 10*3/uL (ref 150–400)
RBC: 5.79 MIL/uL (ref 4.22–5.81)
RDW: 17.7 % — ABNORMAL HIGH (ref 11.5–15.5)
WBC: 5.5 10*3/uL (ref 4.0–10.5)
nRBC: 0 % (ref 0.0–0.2)

## 2023-08-16 LAB — BASIC METABOLIC PANEL
Anion gap: 12 (ref 5–15)
BUN: 12 mg/dL (ref 8–23)
CO2: 29 mmol/L (ref 22–32)
Calcium: 9.9 mg/dL (ref 8.9–10.3)
Chloride: 103 mmol/L (ref 98–111)
Creatinine, Ser: 1.21 mg/dL (ref 0.61–1.24)
GFR, Estimated: >60 mL/min (ref >60)
Glucose, Bld: 144 mg/dL — ABNORMAL HIGH (ref 70–99)
Potassium: 3.6 mmol/L (ref 3.5–5.1)
Sodium: 144 mmol/L (ref 135–145)

## 2023-08-16 LAB — BRAIN NATRIURETIC PEPTIDE: B Natriuretic Peptide: 259 pg/mL — ABNORMAL HIGH (ref 0.0–100.0)

## 2023-08-16 MED ORDER — POTASSIUM CHLORIDE ER 10 MEQ PO TBCR: 10.0000 meq | EXTENDED_RELEASE_TABLET | Freq: Every day | ORAL | 11 refills | Status: DC

## 2023-08-16 MED ORDER — TORSEMIDE 20 MG PO TABS: 20.0000 mg | ORAL_TABLET | Freq: Every day | ORAL | 11 refills | Status: DC

## 2023-08-16 NOTE — Telephone Encounter (Signed)
-----   Message from Jacklynn Ganong sent at 08/16/2023 12:55 PM EDT ----- BNP creeping up.  Change torsemide to 20 mg daily (has been taking every other day), increase KCL to 10 daily Repeat BMET in 10-14 days

## 2023-08-16 NOTE — Telephone Encounter (Signed)
Patient's torsemide and kcl meds has been sent to his pharmacy. In addition, his lab orders has been placed and appointment scheduled. Spoke to patient's caregiver Dewayne Hatch and she's aware, agreeable, and verbalized understanding.

## 2023-08-16 NOTE — Patient Instructions (Addendum)
Thank you for coming in today  If you had labs drawn today, any labs that are abnormal the clinic will call you No news is good news  Medications: No changes  Follow up appointments:  Your physician recommends that you schedule a follow-up appointment in:  3 months With Dr. Earlean Shawl will receive a reminder letter in the mail a few months in advance. If you don't receive a letter, please call our office to schedule the follow-up appointment.    Do the following things EVERYDAY: Weigh yourself in the morning before breakfast. Write it down and keep it in a log. Take your medicines as prescribed Eat low salt foods--Limit salt (sodium) to 2000 mg per day.  Stay as active as you can everyday Limit all fluids for the day to less than 2 liters   At the [facility], you and your health needs are our priority. As part of our continuing mission to provide you with exceptional heart care, we have created designated Provider Care Teams. These Care Teams include your primary Cardiologist (physician) and Advanced Practice Providers (APPs- Physician Assistants and Nurse Practitioners) who all work together to provide you with the care you need, when you need it.   You may see any of the following providers on your designated Care Team at your next follow up: Dr Arvilla Meres Dr Marca Ancona Dr. Marcos Eke, NP Robbie Lis, [place] [facility] Wink, [place] Brynda Peon, NP Karle Plumber, PharmD   Please be sure to bring in all your medications bottles to every appointment.    Thank you for choosing [facility]  If you have any questions or concerns before your next appointment please send [place] a message through [employer]ems or call our office at [phone].    TO LEAVE A MESSAGE FOR THE NURSE SELECT OPTION 2, PLEASE LEAVE A MESSAGE INCLUDING: YOUR NAME DATE OF BIRTH CALL BACK NUMBER REASON FOR CALL**this  is important as we prioritize the call backs  YOU WILL RECEIVE A CALL BACK THE SAME DAY AS LONG AS YOU CALL BEFORE 4:00 PM

## 2023-08-21 DIAGNOSIS — Z09 Encounter for follow-up examination after completed treatment for conditions other than malignant neoplasm: Secondary | ICD-10-CM | POA: Diagnosis not present

## 2023-08-21 DIAGNOSIS — I340 Nonrheumatic mitral (valve) insufficiency: Secondary | ICD-10-CM | POA: Diagnosis not present

## 2023-08-21 DIAGNOSIS — I429 Cardiomyopathy, unspecified: Secondary | ICD-10-CM | POA: Diagnosis not present

## 2023-08-21 DIAGNOSIS — I10 Essential (primary) hypertension: Secondary | ICD-10-CM | POA: Diagnosis not present

## 2023-08-21 DIAGNOSIS — Z299 Encounter for prophylactic measures, unspecified: Secondary | ICD-10-CM | POA: Diagnosis not present

## 2023-08-26 ENCOUNTER — Other Ambulatory Visit (HOSPITAL_COMMUNITY): Payer: 59

## 2023-08-26 NOTE — Progress Notes (Signed)
Remote ICD transmission.   

## 2023-08-29 DIAGNOSIS — Z299 Encounter for prophylactic measures, unspecified: Secondary | ICD-10-CM | POA: Diagnosis not present

## 2023-08-29 DIAGNOSIS — I152 Hypertension secondary to endocrine disorders: Secondary | ICD-10-CM | POA: Diagnosis not present

## 2023-08-29 DIAGNOSIS — I10 Essential (primary) hypertension: Secondary | ICD-10-CM | POA: Diagnosis not present

## 2023-08-29 DIAGNOSIS — I69359 Hemiplegia and hemiparesis following cerebral infarction affecting unspecified side: Secondary | ICD-10-CM | POA: Diagnosis not present

## 2023-08-29 DIAGNOSIS — E1159 Type 2 diabetes mellitus with other circulatory complications: Secondary | ICD-10-CM | POA: Diagnosis not present

## 2023-08-29 DIAGNOSIS — I5022 Chronic systolic (congestive) heart failure: Secondary | ICD-10-CM | POA: Diagnosis not present

## 2023-09-09 ENCOUNTER — Other Ambulatory Visit (HOSPITAL_COMMUNITY): Payer: Self-pay | Admitting: Cardiology

## 2023-09-09 ENCOUNTER — Ambulatory Visit: Payer: 59 | Attending: Cardiovascular Disease

## 2023-09-09 DIAGNOSIS — Z95810 Presence of automatic (implantable) cardiac defibrillator: Secondary | ICD-10-CM | POA: Diagnosis not present

## 2023-09-09 DIAGNOSIS — I5022 Chronic systolic (congestive) heart failure: Secondary | ICD-10-CM | POA: Diagnosis not present

## 2023-09-11 NOTE — Progress Notes (Signed)
EPIC Encounter for ICM Monitoring  Patient Name: Daniel Li is a 67 y.o. male Date: 09/11/2023 Primary Care Physican: Ignatius Specking, MD Primary Cardiologist: Na[facility] Electrophysiologist: [facility] 08/10/2022 Office Weight: 140 lbs                                                  Transmission fluid levels reviewed.     CorVue thoracic impedance suggesting normal fluid levels with the exception of possible fluid accumulation from 8/10-8/20 (correlates with pneumonia and bronchitis).      Prescribed dosage:  Torsemide 20 mg Take 1 tablet by mouth daily Potassium 10 mEq take 1 tablet daily Spironolactone 25 mg take 1 tablet (25 mg total) daily   Labs: 08/16/2023 Creatinine 1.21, BUN 12, Potassium 3.6, Sodium 144, GFR >60 07/31/2023 Creatinine 1.21, BUN 9,   Potassium 4.0, Sodium 144  06/19/2023 Creatinine 1.20, BUN 10, Potassium 3.1, Sodium 137, GFR >60  06/17/2023 Creatinine 1.05, BUN 8,   Potassium 3.5, Sodium 143, GFR >60  06/10/2023 Creatinine 1.05, BUN 8,   Potassium 3.5, Sodium 143, GFR >60 03/16/2023 Creatinine 1.12, BUN 10, Potassium 5.2, Sodium 144, GFR 72  A complete set of results can be found in Results Review.   Recommendations:  No changes.    Follow-up plan: ICM clinic phone appointment on 10/28/2023.    91 day device clinic remote transmission 11/11/2023.     EP/Cardiology Office Visits:   Recall 11/14/2023 with Dr Shirlee Latch.   07/31/2023 with Dr Lynnette Caffey.   Recall 04/04/2023 with Ulyses Jarred, PA.     Copy of ICM check sent to Dr. Nelly Laurence.    3 month ICM trend: 09/10/2023.    12-14 Month ICM trend:     Karie Soda, RN 09/11/2023 4:37 PM

## 2023-09-17 ENCOUNTER — Encounter: Payer: Self-pay | Admitting: Cardiology

## 2023-09-17 DIAGNOSIS — I739 Peripheral vascular disease, unspecified: Secondary | ICD-10-CM | POA: Diagnosis not present

## 2023-09-17 DIAGNOSIS — R52 Pain, unspecified: Secondary | ICD-10-CM | POA: Diagnosis not present

## 2023-09-17 DIAGNOSIS — I2510 Atherosclerotic heart disease of native coronary artery without angina pectoris: Secondary | ICD-10-CM | POA: Diagnosis not present

## 2023-09-17 DIAGNOSIS — I4891 Unspecified atrial fibrillation: Secondary | ICD-10-CM | POA: Diagnosis not present

## 2023-09-17 DIAGNOSIS — I10 Essential (primary) hypertension: Secondary | ICD-10-CM | POA: Diagnosis not present

## 2023-09-17 DIAGNOSIS — Z299 Encounter for prophylactic measures, unspecified: Secondary | ICD-10-CM | POA: Diagnosis not present

## 2023-09-30 DIAGNOSIS — R42 Dizziness and giddiness: Secondary | ICD-10-CM | POA: Diagnosis not present

## 2023-10-18 ENCOUNTER — Other Ambulatory Visit: Payer: Self-pay

## 2023-10-18 ENCOUNTER — Telehealth: Payer: Self-pay

## 2023-10-18 ENCOUNTER — Ambulatory Visit: Payer: 59

## 2023-10-18 NOTE — Telephone Encounter (Signed)
Auth Submission: APPROVED Site of care: Site of care: [facility] Payer: [employer] Medicare Medication & CPT/J Code(s) submitted: Leqvio (Inclisiran) [id number] Route of submission (phone, fax, portal): portal Phone # Fax # Auth type: Buy/Bill PB Units/visits requested: 284mg , q6 months Reference number: [id number] Approval from: 10/18/23 to 10/17/24    Patient will be scheduled for 12/26 at [facility]

## 2023-10-24 ENCOUNTER — Ambulatory Visit: Payer: 59

## 2023-10-28 ENCOUNTER — Ambulatory Visit: Payer: 59 | Attending: Cardiovascular Disease

## 2023-10-28 DIAGNOSIS — Z95810 Presence of automatic (implantable) cardiac defibrillator: Secondary | ICD-10-CM

## 2023-10-28 DIAGNOSIS — I5022 Chronic systolic (congestive) heart failure: Secondary | ICD-10-CM

## 2023-10-31 ENCOUNTER — Telehealth: Payer: Self-pay

## 2023-10-31 NOTE — Telephone Encounter (Signed)
 Remote ICM transmission received.  Attempted call to patient regarding ICM remote transmission and no answer or voice mail option.

## 2023-11-04 ENCOUNTER — Ambulatory Visit: Payer: 59 | Attending: Cardiovascular Disease

## 2023-11-04 ENCOUNTER — Ambulatory Visit: Payer: 59

## 2023-11-04 DIAGNOSIS — I5022 Chronic systolic (congestive) heart failure: Secondary | ICD-10-CM

## 2023-11-04 DIAGNOSIS — Z95810 Presence of automatic (implantable) cardiac defibrillator: Secondary | ICD-10-CM

## 2023-11-05 ENCOUNTER — Encounter: Payer: 59 | Attending: Cardiology | Admitting: *Deleted

## 2023-11-05 VITALS — BP 93/63 | HR 64 | Temp 97.6°F | Resp 16

## 2023-11-05 DIAGNOSIS — E7800 Pure hypercholesterolemia, unspecified: Secondary | ICD-10-CM | POA: Diagnosis not present

## 2023-11-05 MED ORDER — INCLISIRAN SODIUM 284 MG/1.5ML ~~LOC~~ SOSY
284.0000 mg | PREFILLED_SYRINGE | Freq: Once | SUBCUTANEOUS | Status: AC
  Administered 2023-11-05: 284 mg via SUBCUTANEOUS

## 2023-11-05 NOTE — Progress Notes (Signed)
 Increase torsemide to 40 mg daily and KCl to 20 daily.  BMET in 10 days, needs followup in CHF clinic.

## 2023-11-05 NOTE — Progress Notes (Signed)
 Diagnosis: Hyperlipidemia  Provider:  Rolan Barrack MD  Procedure: Injection  Leqvio  (inclisiran), Dose: 284 mg, Site: subcutaneous, Number of injections: 1  Injection Site(s): Right upper quad. abdomen  Post Care: Observation period completed  Discharge: Condition: Good, Destination: Home . AVS Provided  Performed by:  Baldwin Darice Helling, RN

## 2023-11-05 NOTE — Progress Notes (Signed)
 EPIC Encounter for ICM Monitoring  Patient Name: Daniel Li is a 68 y.o. male Date: 11/05/2023 Primary Care Physican: Rosamond Leta NOVAK, MD Primary Cardiologist: Emi Shuck HF Clinic Electrophysiologist: [facility] 08/10/2022 Office Weight: 140 lbs                11/05/2023 Weight: 150 lbs                                   Spoke with patient and heart failure questions reviewed.  Transmission results reviewed.  Pt asymptomatic for fluid accumulation.  Reports feeling well at this time and voices no complaints.   He reports limiting salt intake and he said he may be drinking more than 64 oz fluid daily.   Supervisor Ty can be reached at [phone].   CorVue thoracic impedance suggesting possible fluid accumulation starting 12/13.      Prescribed dosage:  Torsemide  20 mg Take 1 tablet by mouth daily Potassium 10 mEq take 1 tablet daily Spironolactone  25 mg take 1 tablet (25 mg total) daily   Labs: 08/16/2023 Creatinine 1.21, BUN 12, Potassium 3.6, Sodium 144, GFR >60 07/31/2023 Creatinine 1.21, BUN 9,   Potassium 4.0, Sodium 144  06/19/2023 Creatinine 1.20, BUN 10, Potassium 3.1, Sodium 137, GFR >60  06/17/2023 Creatinine 1.05, BUN 8,   Potassium 3.5, Sodium 143, GFR >60  06/10/2023 Creatinine 1.05, BUN 8,   Potassium 3.5, Sodium 143, GFR >60 03/16/2023 Creatinine 1.12, BUN 10, Potassium 5.2, Sodium 144, GFR 72  A complete set of results can be found in Results Review.   Recommendations: Confirmed with facility nurse that patient is Taking Torsemide  20 mg 1 tablet daily with Potassium 10 mEq 1 tablet daily.  Pt will need updated script to [facility] for any temporary changes in medication per facility supervisor Ty.    Follow-up plan: ICM clinic phone appointment on 11/11/2023 to recheck fluid levels.    91 day device clinic remote transmission 11/11/2023.     EP/Cardiology Office Visits:   Recall 11/14/2023 with Dr Shuck.    Recall 04/04/2023 with Charlies Challenger, PA.     Copy of ICM  check sent to Dr. Nancey.   Copy sent to Dr Shuck for review and recommendations as needed.    3 month ICM trend: 11/05/2023.    12-14 Month ICM trend:     [name], RN 11/05/2023 7:16 AM

## 2023-11-06 ENCOUNTER — Other Ambulatory Visit: Payer: Self-pay

## 2023-11-06 DIAGNOSIS — I5022 Chronic systolic (congestive) heart failure: Secondary | ICD-10-CM

## 2023-11-06 DIAGNOSIS — Z95810 Presence of automatic (implantable) cardiac defibrillator: Secondary | ICD-10-CM

## 2023-11-06 MED ORDER — TORSEMIDE 20 MG PO TABS: 40.0000 mg | ORAL_TABLET | Freq: Every day | ORAL | 11 refills | Status: DC

## 2023-11-06 MED ORDER — POTASSIUM CHLORIDE ER 10 MEQ PO TBCR: 20.0000 meq | EXTENDED_RELEASE_TABLET | Freq: Every day | ORAL | 11 refills | Status: DC

## 2023-11-06 NOTE — Progress Notes (Signed)
 Spoke with Ty, supervisor at [facility]isted Living facility and provided Dr Orvilla recommendation to increase Torsemide  to 40 mg daily and Potassium 20 mEq daily.  Also scheduled BMET for 1/20 and patient will be driven to [employer] at [facility] to have labs drawn.  Advised HF clinic will call to set up an OV with Dr Rolan.    She verbalized understanding and agreed with plan.  Medication updated in Epic and sent to [employer] as requested.

## 2023-11-06 NOTE — Progress Notes (Signed)
 Spoke with patient and advised of recommendations and he verbalized understanding.

## 2023-11-11 ENCOUNTER — Ambulatory Visit (INDEPENDENT_AMBULATORY_CARE_PROVIDER_SITE_OTHER): Payer: 59

## 2023-11-11 DIAGNOSIS — I5022 Chronic systolic (congestive) heart failure: Secondary | ICD-10-CM | POA: Diagnosis not present

## 2023-11-11 LAB — CUP PACEART REMOTE DEVICE CHECK
Battery Remaining Longevity: 50 mo
Battery Remaining Percentage: 48 %
Battery Voltage: 2.95 V
Brady Statistic RV Percent Paced: 1 %
Date Time Interrogation Session: 20250109104755
HighPow Impedance: 59 Ohm
HighPow Impedance: 59 Ohm
Implantable Lead Connection Status: 753985
Implantable Lead Implant Date: 20181120
Implantable Lead Location: 753860
Implantable Pulse Generator Implant Date: 20181120
Lead Channel Impedance Value: 340 Ohm
Lead Channel Pacing Threshold Amplitude: 1.25 V
Lead Channel Pacing Threshold Pulse Width: 0.5 ms
Lead Channel Sensing Intrinsic Amplitude: 10 mV
Lead Channel Setting Pacing Amplitude: 2.5 V
Lead Channel Setting Pacing Pulse Width: 0.5 ms
Lead Channel Setting Sensing Sensitivity: 0.5 mV
Pulse Gen Serial Number: 9786940
Zone Setting Status: 755011

## 2023-11-13 ENCOUNTER — Encounter: Payer: Self-pay | Admitting: Cardiology

## 2023-11-13 NOTE — Progress Notes (Signed)
 No ICM remote transmission received for 11/11/2023 and next ICM transmission scheduled for 11/19/2023.

## 2023-11-19 ENCOUNTER — Encounter (INDEPENDENT_AMBULATORY_CARE_PROVIDER_SITE_OTHER): Payer: 59

## 2023-11-19 DIAGNOSIS — I5022 Chronic systolic (congestive) heart failure: Secondary | ICD-10-CM

## 2023-11-19 DIAGNOSIS — Z95810 Presence of automatic (implantable) cardiac defibrillator: Secondary | ICD-10-CM

## 2023-11-20 ENCOUNTER — Encounter: Payer: Self-pay | Admitting: Cardiovascular Disease

## 2023-11-20 NOTE — Progress Notes (Signed)
EPIC Encounter for ICM Monitoring  Patient Name: Daniel Li is a 67 y.o. male Date: 11/20/2023 Primary Care Physican: Ignatius Specking, MD Primary Cardiologist: [facility] Electrophysiologist: [facility] 08/10/2022 Office Weight: 140 lbs                11/05/2023 Weight: 150 lbs                                   Transmission results reviewed.   CorVue thoracic impedance suggesting fluid levels returned to normal.      Prescribed dosage:  Torsemide 20 mg Take 2 tablets (40 mg total) by mouth daily Potassium 10 mEq take 2 tablets (20 mEq total) by mouth daily Spironolactone 25 mg take 1 tablet (25 mg total) daily   Labs: 08/16/2023 Creatinine 1.21, BUN 12, Potassium 3.6, Sodium 144, GFR >60 07/31/2023 Creatinine 1.21, BUN 9,   Potassium 4.0, Sodium 144  06/19/2023 Creatinine 1.20, BUN 10, Potassium 3.1, Sodium 137, GFR >60  06/17/2023 Creatinine 1.05, BUN 8,   Potassium 3.5, Sodium 143, GFR >60  06/10/2023 Creatinine 1.05, BUN 8,   Potassium 3.5, Sodium 143, GFR >60 03/16/2023 Creatinine 1.12, BUN 10, Potassium 5.2, Sodium 144, GFR 72  A complete set of results can be found in Results Review.   Recommendations:  No changes.   Follow-up plan: ICM clinic phone appointment on 11/11/2023 to recheck fluid levels.    91 day device clinic remote transmission 11/11/2023.     EP/Cardiology Office Visits:   Recall 11/14/2023 with Dr Shirlee Latch.    Recall 04/04/2023 with Ulyses Jarred, PA.     Copy of ICM check sent to Dr. Nelly Laurence.  3 month ICM trend: 11/15/2023.    Karie Soda, RN 11/20/2023 4:40 PM

## 2023-12-02 ENCOUNTER — Encounter: Payer: Self-pay | Admitting: Cardiology

## 2023-12-09 ENCOUNTER — Encounter: Payer: 59 | Attending: Cardiovascular Disease

## 2023-12-09 DIAGNOSIS — I5022 Chronic systolic (congestive) heart failure: Secondary | ICD-10-CM | POA: Diagnosis not present

## 2023-12-09 DIAGNOSIS — Z95810 Presence of automatic (implantable) cardiac defibrillator: Secondary | ICD-10-CM | POA: Diagnosis not present

## 2023-12-11 NOTE — Progress Notes (Signed)
EPIC Encounter for ICM Monitoring  Patient Name: Daniel Li is a 68 y.o. male Date: 12/11/2023 Primary Care Physican: Ignatius Specking, MD Primary Cardiologist: [facility] Electrophysiologist: [facility] 08/10/2022 Office Weight: 140 lbs                11/05/2023 Weight: 150 lbs                                   Transmission results reviewed.    CorVue thoracic impedance suggesting normal fluid levels since 11/09/2023.      Prescribed dosage:  Torsemide 20 mg Take 2 tablets (40 mg total) by mouth daily Potassium 10 mEq take 2 tablets (20 mEq total) by mouth daily Spironolactone 25 mg take 1 tablet (25 mg total) daily   Labs: 08/16/2023 Creatinine 1.21, BUN 12, Potassium 3.6, Sodium 144, GFR >60 07/31/2023 Creatinine 1.21, BUN 9,   Potassium 4.0, Sodium 144  06/19/2023 Creatinine 1.20, BUN 10, Potassium 3.1, Sodium 137, GFR >60  06/17/2023 Creatinine 1.05, BUN 8,   Potassium 3.5, Sodium 143, GFR >60  06/10/2023 Creatinine 1.05, BUN 8,   Potassium 3.5, Sodium 143, GFR >60 03/16/2023 Creatinine 1.12, BUN 10, Potassium 5.2, Sodium 144, GFR 72  A complete set of results can be found in Results Review.   Recommendations:  No changes.   Follow-up plan: ICM clinic phone appointment on 01/20/2024.    91 day device clinic remote transmission 02/10/2024.     EP/Cardiology Office Visits:   12/19/2023 with HF Clinic. Recall 11/14/2023 with Dr Shirlee Latch.    Recall 04/04/2023 with Ulyses Jarred, PA.     Copy of ICM check sent to Dr. Nelly Laurence.  3 month ICM trend: 12/09/2023.    12-14 Month ICM trend:     Karie Soda, RN 12/11/2023 4:11 PM

## 2023-12-16 NOTE — Progress Notes (Incomplete)
ADVANCED HF CLINIC NOTE    Ignatius Specking, MD @[facility] @ Marca Ancona, MD  Chief Complaint:   HPI: Daniel Li is a 68 y.o. male with history of CAD, chronic systolic CHF/ischemic cardiomyopathy s/p St. Jude ICD in 2018, HTN, HLD, tobacco abuse, hx ETOH abuse, mitral regurgitation, OSA, hx CVA, hx PE 05/24, DM.  He had NSTEMI in 2007 s/p BMS to D1 and OM2. Later had STEMI in 2013 s/p BMS to RCA. Had NSTEMI in 2014 and found to have CTO of LCX treated medically. He had anterior STEMI in 04/18 s/p DES to LAD. Had DES to mLAD 10/19 in setting of NSTEMI. Last LHC at [facility] in 2023: patent stents in p LAD and D1, moderate diffuse disease m LAD, LCX occluded proximally and fills via collaterals from LAD, patent p RCA stent, 50% PLV, 50% PDA. CAD treated medically.  His EF has been in the 20-25% range by echo since 2018.   Presented for mTEER 10/24, however found no significant MR on TEE, despite multiple TTEs suggestive otherwise. Procedure aborted and he was discharged.    He resides in a group home.   ROS: All systems negative except as listed in HPI, PMH and Problem List.  SH:  Social History   Socioeconomic History   Marital status: Single    Spouse name: Not on file   Number of children: 0   Years of education: 9   Highest education level: 9th grade  Occupational History   Not on file  Tobacco Use   Smoking status: Former    Current packs/day: 0.50    Average packs/day: 0.5 packs/day for 46.4 years (23.2 ttl pk-yrs)    Types: Cigarettes, Cigars    Start date: 07/21/1977   Smokeless tobacco: Never  Vaping Use   Vaping status: Never Used  Substance and Sexual Activity   Alcohol use: Yes    Comment: 09/17/2017 "nothing since 05/2017"   Drug use: Not Currently    Comment: previously incarcerated for drug related offense.   Sexual activity: Not Currently  Other Topics Concern   Not on file  Social History Narrative   Not on file   Social Drivers of Health    Financial Resource Strain: Low Risk  (02/25/2023)   Received from [facility], [facility]   Overall Financial Resource Strain (CARDIA)    Difficulty of Paying Living Expenses: Not hard at all  Food Insecurity: No Food Insecurity (08/07/2023)   Hunger Vital Sign    Worried About Running Out of Food in the Last Year: Never true    Ran Out of Food in the Last Year: Never true  Transportation Needs: Unmet Transportation Needs (08/07/2023)   PRAPARE - Administrator, Civil Service (Medical): Yes    Lack of Transportation (Non-Medical): Yes  Physical Activity: Not on file  Stress: Not on file  Social Connections: Not on file  Intimate Partner Violence: Not At Risk (08/07/2023)   Humiliation, Afraid, Rape, and Kick questionnaire    Fear of Current or Ex-Partner: No    Emotionally Abused: No    Physically Abused: No    Sexually Abused: No    FH:  Family History  Problem Relation Age of Onset   Stroke Mother    Heart attack Mother    Heart attack Father    Stroke Sister    Heart attack Sister    Heart attack Brother    Stroke Brother  Liver disease Neg Hx    Colon cancer Neg Hx     Past Medical History:  Diagnosis Date   AICD (automatic cardioverter/defibrillator) present    Anxiety    Arthritis    "all over" (09/17/2017)   Burn    CAD (coronary artery disease)    a. NSTEMI s/p BMS to 1st Diagonal and distal OM2 in 2007; b. STEMI 03/26/12 s/p BMS to RCA; c. NSTEMI 10/2012 : CTO of LCx (unable to open with PCI) and PL branch, mod dz of LAD and diagonal, and preserved LV systolic fxn, Med Rx;  d.  anterior STEMI in 01/2017 with DES to Proximal LAD   CAD in native artery 08/23/2020   Cardiomyopathy EF 35% on cath 06/30/14, new from jan 2015 07/20/2014   Chest pain    CHF (congestive heart failure) (HCC)    Chronic back pain    "all over" (09/17/2017)   DDD (degenerative disc disease), cervical    Depression    GERD (gastroesophageal reflux disease)     Headache    "a few/wk" (09/17/2017)   HTN (hypertension)    Hypercholesterolemia    Mental disorder    Myocardial infarction (HCC)    "I've had 7" (09/17/2017)   Pulmonary edema    Respiratory failure (HCC)    Sciatic pain    Sleep apnea    Stroke [facility])    a. multiple dating back to 2002; *I''ve had 5; LUE/LLE weaker since" (09/17/2017)   Tibia fracture (l) leg   Tobacco abuse    Type 2 diabetes mellitus (HCC)    Unstable angina (HCC)     Current Outpatient Medications  Medication Sig Dispense Refill   acetaminophen (TYLENOL) 325 MG tablet Take 650 mg by mouth every 6 (six) hours as needed for mild pain.     albuterol (VENTOLIN HFA) 108 (90 Base) MCG/ACT inhaler Inhale 2 puffs into the lungs every 4 (four) hours as needed for wheezing or shortness of breath. 1 each 3   amiodarone (PACERONE) 200 MG tablet Take 1 tablet (200 mg total) by mouth daily. 90 tablet 3   apixaban (ELIQUIS) 5 MG TABS tablet Take 5 mg by mouth 2 (two) times daily.     ASPIRIN 81 PO Take 81 mg by mouth daily.     digoxin (LANOXIN) 0.125 MG tablet TAKE (1) TABLET BY MOUTH ONCE DAILY. 30 tablet 11   empagliflozin (JARDIANCE) 10 MG TABS tablet Take 10 mg by mouth daily.     ezetimibe (ZETIA) 10 MG tablet Take 1 tablet (10 mg total) by mouth daily. 30 tablet 3   gabapentin (NEURONTIN) 100 MG capsule Take 100 mg by mouth 2 (two) times daily.     lidocaine (LIDODERM) 5 % Place 1 patch onto the skin every 12 (twelve) hours. Leave off 12 hours     metFORMIN (GLUCOPHAGE) 500 MG tablet Take 500 mg by mouth daily with breakfast.     nitroGLYCERIN (NITROSTAT) 0.4 MG SL tablet PLACE ONE (1) TABLET UNDER TONGUE EVERY 5 MINUTES UP TO (3) DOSES AS NEEDED FOR CHEST PAIN. IF NO RELIEF, CONTACT MD. 25 tablet 3   pantoprazole (PROTONIX) 40 MG tablet Take 40 mg by mouth daily.     potassium chloride (KLOR-CON) 10 MEQ tablet Take 2 tablets (20 mEq total) by mouth daily. 60 tablet 11   sacubitril-valsartan (ENTRESTO) 24-26 MG Take 1  tablet by mouth 2 (two) times daily.     spironolactone (ALDACTONE) 25 MG tablet Take 1 tablet (  25 mg total) by mouth daily. 90 tablet 3   torsemide (DEMADEX) 20 MG tablet Take 2 tablets (40 mg total) by mouth daily. 60 tablet 11   traMADol (ULTRAM) 50 MG tablet Take 50 mg by mouth every 6 (six) hours as needed.     No current facility-administered medications for this visit.    There were no vitals filed for this visit.  PHYSICAL EXAM:  General:  Well appearing. No resp difficulty HEENT: normal Neck: supple. JVP flat. Carotids 2+ bilaterally; no bruits. No lymphadenopathy or thryomegaly appreciated. Cor: PMI normal. Regular rate & rhythm. No rubs, gallops or murmurs. Lungs: clear Abdomen: soft, nontender, nondistended. No hepatosplenomegaly. No bruits or masses. Good bowel sounds. Extremities: no cyanosis, clubbing, rash, edema Neuro: alert & orientedx3, cranial nerves grossly intact. Moves all 4 extremities w/o difficulty. Affect pleasant.   ECG:    ASSESSMENT & PLAN:  Chronic systolic CHF -D/t ischemic cardiomyopathy -S/p St. Jude ICD -EF has been in 20-25% range since 2018 -TEE 10/24: EF 20-2%, RV okay, severe LAE, mild MR  2. CAD: -Extensive CAD history with multiple prior Mis and stents -Last cath in 2023 with patent stents, occluded LCX and moderate diffuse disease elsewhere treated medically -Not on aspirin d/t stable CAD and anticoagulated -No statin given history of rhabdomyolysis while on statin therapy -***  3. Hx LV thrombus -No thrombus noted on most recent echocardiograms -Had been on Warfarin which was stopped during prior admit to [facility]. Now on apixa -

## 2023-12-19 ENCOUNTER — Encounter (HOSPITAL_COMMUNITY): Payer: 59

## 2023-12-23 NOTE — Progress Notes (Signed)
 Remote ICD transmission.

## 2023-12-26 ENCOUNTER — Encounter (HOSPITAL_COMMUNITY): Payer: Self-pay

## 2023-12-26 ENCOUNTER — Ambulatory Visit (HOSPITAL_COMMUNITY)
Admission: RE | Admit: 2023-12-26 | Discharge: 2023-12-26 | Disposition: A | Discharge status: Home / Self Care | Payer: 59 | Source: Ambulatory Visit | Attending: Physician Assistant | Admitting: Physician Assistant

## 2023-12-26 VITALS — BP 110/70

## 2023-12-26 DIAGNOSIS — I2510 Atherosclerotic heart disease of native coronary artery without angina pectoris: Secondary | ICD-10-CM | POA: Diagnosis not present

## 2023-12-26 DIAGNOSIS — Z87891 Personal history of nicotine dependence: Secondary | ICD-10-CM | POA: Diagnosis not present

## 2023-12-26 DIAGNOSIS — Z5982 Transportation insecurity: Secondary | ICD-10-CM | POA: Diagnosis not present

## 2023-12-26 DIAGNOSIS — I252 Old myocardial infarction: Secondary | ICD-10-CM | POA: Insufficient documentation

## 2023-12-26 DIAGNOSIS — Z86711 Personal history of pulmonary embolism: Secondary | ICD-10-CM | POA: Insufficient documentation

## 2023-12-26 DIAGNOSIS — G4733 Obstructive sleep apnea (adult) (pediatric): Secondary | ICD-10-CM | POA: Diagnosis not present

## 2023-12-26 DIAGNOSIS — I255 Ischemic cardiomyopathy: Secondary | ICD-10-CM | POA: Insufficient documentation

## 2023-12-26 DIAGNOSIS — Z7984 Long term (current) use of oral hypoglycemic drugs: Secondary | ICD-10-CM | POA: Insufficient documentation

## 2023-12-26 DIAGNOSIS — I5042 Chronic combined systolic (congestive) and diastolic (congestive) heart failure: Secondary | ICD-10-CM

## 2023-12-26 DIAGNOSIS — Z95810 Presence of automatic (implantable) cardiac defibrillator: Secondary | ICD-10-CM | POA: Insufficient documentation

## 2023-12-26 DIAGNOSIS — Z955 Presence of coronary angioplasty implant and graft: Secondary | ICD-10-CM | POA: Insufficient documentation

## 2023-12-26 DIAGNOSIS — I5022 Chronic systolic (congestive) heart failure: Secondary | ICD-10-CM | POA: Diagnosis not present

## 2023-12-26 DIAGNOSIS — Z8673 Personal history of transient ischemic attack (TIA), and cerebral infarction without residual deficits: Secondary | ICD-10-CM | POA: Diagnosis not present

## 2023-12-26 DIAGNOSIS — I110 Hypertensive heart disease with heart failure: Secondary | ICD-10-CM | POA: Diagnosis not present

## 2023-12-26 DIAGNOSIS — Z7901 Long term (current) use of anticoagulants: Secondary | ICD-10-CM | POA: Diagnosis not present

## 2023-12-26 DIAGNOSIS — E119 Type 2 diabetes mellitus without complications: Secondary | ICD-10-CM | POA: Diagnosis not present

## 2023-12-26 DIAGNOSIS — Z79899 Other long term (current) drug therapy: Secondary | ICD-10-CM | POA: Insufficient documentation

## 2023-12-26 LAB — FERRITIN: Ferritin: 53 ng/mL (ref 24–336)

## 2023-12-26 LAB — IRON AND TIBC
Iron: 90 ug/dL (ref 45–182)
Saturation Ratios: 21 % (ref 17.9–39.5)
TIBC: 433 ug/dL (ref 250–450)
UIBC: 343 ug/dL

## 2023-12-26 LAB — COMPREHENSIVE METABOLIC PANEL
ALT: 32 U/L (ref 0–44)
AST: 41 U/L (ref 15–41)
Albumin: 4 g/dL (ref 3.5–5.0)
Alkaline Phosphatase: 60 U/L (ref 38–126)
Anion gap: 15 (ref 5–15)
BUN: 20 mg/dL (ref 8–23)
CO2: 28 mmol/L (ref 22–32)
Calcium: 9.4 mg/dL (ref 8.9–10.3)
Chloride: 97 mmol/L — ABNORMAL LOW (ref 98–111)
Creatinine, Ser: 1.45 mg/dL — ABNORMAL HIGH (ref 0.61–1.24)
GFR, Estimated: 53 mL/min — ABNORMAL LOW (ref >60)
Glucose, Bld: 91 mg/dL (ref 70–99)
Potassium: 4.1 mmol/L (ref 3.5–5.1)
Sodium: 140 mmol/L (ref 135–145)
Total Bilirubin: 0.8 mg/dL (ref 0.0–1.2)
Total Protein: 7.4 g/dL (ref 6.5–8.1)

## 2023-12-26 LAB — CBC
HCT: 44.3 % (ref 39.0–52.0)
Hemoglobin: 14.3 g/dL (ref 13.0–17.0)
MCH: 25.7 pg — ABNORMAL LOW (ref 26.0–34.0)
MCHC: 32.3 g/dL (ref 30.0–36.0)
MCV: 79.7 fL — ABNORMAL LOW (ref 80.0–100.0)
Platelets: 317 10*3/uL (ref 150–400)
RBC: 5.56 MIL/uL (ref 4.22–5.81)
RDW: 16.7 % — ABNORMAL HIGH (ref 11.5–15.5)
WBC: 6.8 10*3/uL (ref 4.0–10.5)
nRBC: 0 % (ref 0.0–0.2)

## 2023-12-26 NOTE — Progress Notes (Signed)
 ADVANCED HF CLINIC NOTE    Vyas, Angelina Pih, MD @[facility] @ HF DM: Marca Ancona, MD  Chief Complaint/Reason for Visit: Routien F/u for Systolic Heart Failure   HPI: Daniel Li is a 68 y.o. male with history of CAD, chronic systolic CHF/ischemic cardiomyopathy s/p St. Jude ICD in 2018, HTN, HLD, tobacco abuse, hx ETOH abuse, mitral regurgitation, OSA, hx CVA, hx PE 05/24, DM.  He had NSTEMI in 2007 s/p BMS to D1 and OM2. Later had STEMI in 2013 s/p BMS to RCA. Had NSTEMI in 2014 and found to have CTO of LCX treated medically. He had anterior STEMI in 04/18 s/p DES to LAD. Had DES to mLAD 10/19 in setting of NSTEMI. Last LHC at [facility] in 2023: patent stents in p LAD and D1, moderate diffuse disease mLAD, LCX occluded proximally and fills via collaterals from LAD, patent pRCA stent, 50% PLV, 50% PDA. CAD treated medically.  His EF has been in the 20-25% range by echo since 2018.   Presented for mTEER 10/24, however found no significant MR on TEE, despite multiple TTEs suggestive otherwise. Procedure aborted and he was discharged.   He presents today for routine f/u. He resides in a group home. They help him w/ his meds. Says he is doing well symptomatically. Able to get around w/o any exertional dyspnea w/ ALDs. Denies CP. Only complaints is low BP at times, BP today 110/70. EKG NSR 74 bpm.   ROS: All systems negative except as listed in HPI, PMH and Problem List.  SH:  Social History   Socioeconomic History   Marital status: Single    Spouse name: Not on file   Number of children: 0   Years of education: 9   Highest education level: 9th grade  Occupational History   Not on file  Tobacco Use   Smoking status: Former    Current packs/day: 0.50    Average packs/day: 0.5 packs/day for 46.4 years (23.2 ttl pk-yrs)    Types: Cigarettes, Cigars    Start date: 07/21/1977   Smokeless tobacco: Never  Vaping Use   Vaping status: Never Used  Substance and Sexual Activity    Alcohol use: Yes    Comment: 09/17/2017 "nothing since 05/2017"   Drug use: Not Currently    Comment: previously incarcerated for drug related offense.   Sexual activity: Not Currently  Other Topics Concern   Not on file  Social History Narrative   Not on file   Social Drivers of Health   Financial Resource Strain: Low Risk  (02/25/2023)   Received from [facility], [facility]   Overall Financial Resource Strain (CARDIA)    Difficulty of Paying Living Expenses: Not hard at all  Food Insecurity: No Food Insecurity (08/07/2023)   Hunger Vital Sign    Worried About Running Out of Food in the Last Year: Never true    Ran Out of Food in the Last Year: Never true  Transportation Needs: Unmet Transportation Needs (08/07/2023)   PRAPARE - Administrator, Civil Service (Medical): Yes    Lack of Transportation (Non-Medical): Yes  Physical Activity: Not on file  Stress: Not on file  Social Connections: Not on file  Intimate Partner Violence: Not At Risk (08/07/2023)   Humiliation, Afraid, Rape, and Kick questionnaire    Fear of Current or Ex-Partner: No    Emotionally Abused: No    Physically Abused: No    Sexually Abused: No  FH:  Family History  Problem Relation Age of Onset   Stroke Mother    Heart attack Mother    Heart attack Father    Stroke Sister    Heart attack Sister    Heart attack Brother    Stroke Brother    Liver disease Neg Hx    Colon cancer Neg Hx     Past Medical History:  Diagnosis Date   AICD (automatic cardioverter/defibrillator) present    Anxiety    Arthritis    "all over" (09/17/2017)   Burn    CAD (coronary artery disease)    a. NSTEMI s/p BMS to 1st Diagonal and distal OM2 in 2007; b. STEMI 03/26/12 s/p BMS to RCA; c. NSTEMI 10/2012 : CTO of LCx (unable to open with PCI) and PL branch, mod dz of LAD and diagonal, and preserved LV systolic fxn, Med Rx;  d.  anterior STEMI in 01/2017 with DES to Proximal LAD   CAD in native artery  08/23/2020   Cardiomyopathy EF 35% on cath 06/30/14, new from jan 2015 07/20/2014   Chest pain    CHF (congestive heart failure) (HCC)    Chronic back pain    "all over" (09/17/2017)   DDD (degenerative disc disease), cervical    Depression    GERD (gastroesophageal reflux disease)    Headache    "a few/wk" (09/17/2017)   HTN (hypertension)    Hypercholesterolemia    Mental disorder    Myocardial infarction (HCC)    "I've had 7" (09/17/2017)   Pulmonary edema    Respiratory failure (HCC)    Sciatic pain    Sleep apnea    Stroke [facility])    a. multiple dating back to 2002; *I''ve had 5; LUE/LLE weaker since" (09/17/2017)   Tibia fracture (l) leg   Tobacco abuse    Type 2 diabetes mellitus (HCC)    Unstable angina (HCC)     Current Outpatient Medications  Medication Sig Dispense Refill   acetaminophen (TYLENOL) 325 MG tablet Take 650 mg by mouth every 6 (six) hours as needed for mild pain.     amiodarone (PACERONE) 200 MG tablet Take 1 tablet (200 mg total) by mouth daily. 90 tablet 3   apixaban (ELIQUIS) 5 MG TABS tablet Take 5 mg by mouth 2 (two) times daily.     ASPIRIN 81 PO Take 81 mg by mouth daily.     digoxin (LANOXIN) 0.125 MG tablet TAKE (1) TABLET BY MOUTH ONCE DAILY. 30 tablet 11   empagliflozin (JARDIANCE) 10 MG TABS tablet Take 10 mg by mouth daily.     ezetimibe (ZETIA) 10 MG tablet Take 1 tablet (10 mg total) by mouth daily. 30 tablet 3   gabapentin (NEURONTIN) 100 MG capsule Take 100 mg by mouth 2 (two) times daily.     lidocaine (LIDODERM) 5 % Place 1 patch onto the skin every 12 (twelve) hours. Leave off 12 hours     metFORMIN (GLUCOPHAGE) 500 MG tablet Take 500 mg by mouth daily with breakfast.     pantoprazole (PROTONIX) 40 MG tablet Take 40 mg by mouth daily.     sacubitril-valsartan (ENTRESTO) 24-26 MG Take 1 tablet by mouth 2 (two) times daily.     spironolactone (ALDACTONE) 25 MG tablet Take 1 tablet (25 mg total) by mouth daily. 90 tablet 3   traMADol  (ULTRAM) 50 MG tablet Take 50 mg by mouth every 6 (six) hours as needed.     albuterol (VENTOLIN HFA)  108 (90 Base) MCG/ACT inhaler Inhale 2 puffs into the lungs every 4 (four) hours as needed for wheezing or shortness of breath. (Patient not taking: Reported on 12/26/2023) 1 each 3   nitroGLYCERIN (NITROSTAT) 0.4 MG SL tablet PLACE ONE (1) TABLET UNDER TONGUE EVERY 5 MINUTES UP TO (3) DOSES AS NEEDED FOR CHEST PAIN. IF NO RELIEF, CONTACT MD. (Patient not taking: Reported on 12/26/2023) 25 tablet 3   potassium chloride (KLOR-CON) 10 MEQ tablet Take 2 tablets (20 mEq total) by mouth daily. 60 tablet 11   torsemide (DEMADEX) 20 MG tablet Take 2 tablets (40 mg total) by mouth daily. 60 tablet 11   No current facility-administered medications for this encounter.    Vitals:   12/26/23 1923  BP: 110/70    PHYSICAL EXAM:  General:  Well appearing, thin. No respiratory difficulty HEENT: normal Neck: supple. no JVD. Carotids 2+ bilat; no bruits. No lymphadenopathy or thyromegaly appreciated. Cor: PMI nondisplaced. Regular rate & rhythm. No rubs, gallops or murmurs. Lungs: clear Abdomen: soft, nontender, nondistended. No hepatosplenomegaly. No bruits or masses. Good bowel sounds. Extremities: no cyanosis, clubbing, rash, edema Neuro: alert & oriented x 3, cranial nerves grossly intact. moves all 4 extremities w/o difficulty. Affect pleasant.    ECG: NSR 74 bpm, personally reviewed    ASSESSMENT & PLAN:  Chronic systolic CHF:  - D/t ischemic cardiomyopathy - EF has been in 20-25% range since 2018 -TEE 10/24: EF 20-2%, RV okay, severe LAE, mild MR - S/p St. Jude ICD. Device interrogation today shows stable impedence. No VT  - NYHA Class II. Evuolemic on exam and device interrogation - Continue Entresto 24-26 bid. BP too soft for titration - Continue Spiro 25 mg daily  - Continue  Jardiance 10 mg daily  - Continue Digoxin 0.125 mg daily  - Continue Torsemide 20 mg daily  - Check BMP/BNP  today and dig level   2. CAD: - Extensive CAD history with multiple prior Mis and stents - Last cath in 2023 with patent stents, occluded LCX and moderate diffuse disease elsewhere treated medically - denies CP - no ASA w/ Eliquis  - No statin given history of rhabdomyolysis while on statin therapy   3. Hx LV thrombus -No thrombus noted on most recent echocardiograms -Had been on Warfarin which was stopped during prior admit to [facility]. Now on apixaban   4. H/o NSVT - has ICD, no VT on device interrogation  - on amio 200 mg daily. Check HFTs and TSH   F/u w/ Dr. Shirlee Latch in 3 months

## 2023-12-26 NOTE — Patient Instructions (Signed)
 No medication changes today. Labs today - will call you if abnormal. Return to Heart Failure Pharm[facility] Clinic in 4 weeks. Return to see Dr. Shirlee Latch in 3 months. Please call us at [phone] in April to schedule this appointment. Please call us at [phone] if any questions or concerns prior to your next appointment.

## 2023-12-27 DIAGNOSIS — I152 Hypertension secondary to endocrine disorders: Secondary | ICD-10-CM | POA: Diagnosis not present

## 2023-12-27 DIAGNOSIS — E1165 Type 2 diabetes mellitus with hyperglycemia: Secondary | ICD-10-CM | POA: Diagnosis not present

## 2023-12-27 DIAGNOSIS — Z299 Encounter for prophylactic measures, unspecified: Secondary | ICD-10-CM | POA: Diagnosis not present

## 2023-12-27 DIAGNOSIS — I2699 Other pulmonary embolism without acute cor pulmonale: Secondary | ICD-10-CM | POA: Diagnosis not present

## 2023-12-27 DIAGNOSIS — I739 Peripheral vascular disease, unspecified: Secondary | ICD-10-CM | POA: Diagnosis not present

## 2023-12-27 DIAGNOSIS — E1159 Type 2 diabetes mellitus with other circulatory complications: Secondary | ICD-10-CM | POA: Diagnosis not present

## 2023-12-27 DIAGNOSIS — I10 Essential (primary) hypertension: Secondary | ICD-10-CM | POA: Diagnosis not present

## 2023-12-27 DIAGNOSIS — I4891 Unspecified atrial fibrillation: Secondary | ICD-10-CM | POA: Diagnosis not present

## 2024-01-07 ENCOUNTER — Encounter: Payer: Self-pay | Admitting: Cardiology

## 2024-01-09 ENCOUNTER — Other Ambulatory Visit (HOSPITAL_COMMUNITY): Payer: Self-pay | Admitting: Cardiology

## 2024-01-22 ENCOUNTER — Other Ambulatory Visit: Payer: Self-pay

## 2024-01-22 ENCOUNTER — Emergency Department (HOSPITAL_COMMUNITY)

## 2024-01-22 ENCOUNTER — Observation Stay (HOSPITAL_COMMUNITY)
Admission: EM | Admit: 2024-01-22 | Discharge: 2024-01-23 | Disposition: A | Discharge status: Nursing Home (Includes ICF) | Attending: Family Medicine | Admitting: Family Medicine

## 2024-01-22 ENCOUNTER — Encounter (HOSPITAL_COMMUNITY): Payer: Self-pay | Admitting: Internal Medicine

## 2024-01-22 DIAGNOSIS — R059 Cough, unspecified: Secondary | ICD-10-CM | POA: Diagnosis not present

## 2024-01-22 DIAGNOSIS — R509 Fever, unspecified: Secondary | ICD-10-CM | POA: Diagnosis present

## 2024-01-22 DIAGNOSIS — Z87891 Personal history of nicotine dependence: Secondary | ICD-10-CM | POA: Insufficient documentation

## 2024-01-22 DIAGNOSIS — I130 Hypertensive heart and chronic kidney disease with heart failure and stage 1 through stage 4 chronic kidney disease, or unspecified chronic kidney disease: Secondary | ICD-10-CM | POA: Insufficient documentation

## 2024-01-22 DIAGNOSIS — I5042 Chronic combined systolic (congestive) and diastolic (congestive) heart failure: Secondary | ICD-10-CM | POA: Diagnosis not present

## 2024-01-22 DIAGNOSIS — K219 Gastro-esophageal reflux disease without esophagitis: Secondary | ICD-10-CM | POA: Diagnosis not present

## 2024-01-22 DIAGNOSIS — I714 Abdominal aortic aneurysm, without rupture, unspecified: Secondary | ICD-10-CM | POA: Diagnosis not present

## 2024-01-22 DIAGNOSIS — I7143 Infrarenal abdominal aortic aneurysm, without rupture: Secondary | ICD-10-CM | POA: Insufficient documentation

## 2024-01-22 DIAGNOSIS — Z7901 Long term (current) use of anticoagulants: Secondary | ICD-10-CM | POA: Diagnosis not present

## 2024-01-22 DIAGNOSIS — Z955 Presence of coronary angioplasty implant and graft: Secondary | ICD-10-CM | POA: Insufficient documentation

## 2024-01-22 DIAGNOSIS — N183 Chronic kidney disease, stage 3 unspecified: Secondary | ICD-10-CM

## 2024-01-22 DIAGNOSIS — R079 Chest pain, unspecified: Secondary | ICD-10-CM | POA: Diagnosis not present

## 2024-01-22 DIAGNOSIS — Z7982 Long term (current) use of aspirin: Secondary | ICD-10-CM | POA: Insufficient documentation

## 2024-01-22 DIAGNOSIS — Z7984 Long term (current) use of oral hypoglycemic drugs: Secondary | ICD-10-CM | POA: Diagnosis not present

## 2024-01-22 DIAGNOSIS — N179 Acute kidney failure, unspecified: Secondary | ICD-10-CM | POA: Diagnosis not present

## 2024-01-22 DIAGNOSIS — N1831 Chronic kidney disease, stage 3a: Secondary | ICD-10-CM | POA: Diagnosis not present

## 2024-01-22 DIAGNOSIS — Z95810 Presence of automatic (implantable) cardiac defibrillator: Secondary | ICD-10-CM | POA: Diagnosis not present

## 2024-01-22 DIAGNOSIS — R531 Weakness: Secondary | ICD-10-CM | POA: Diagnosis not present

## 2024-01-22 DIAGNOSIS — N189 Chronic kidney disease, unspecified: Secondary | ICD-10-CM | POA: Diagnosis present

## 2024-01-22 DIAGNOSIS — D3501 Benign neoplasm of right adrenal gland: Secondary | ICD-10-CM | POA: Diagnosis not present

## 2024-01-22 DIAGNOSIS — E1122 Type 2 diabetes mellitus with diabetic chronic kidney disease: Secondary | ICD-10-CM | POA: Insufficient documentation

## 2024-01-22 DIAGNOSIS — I700 Atherosclerosis of aorta: Secondary | ICD-10-CM | POA: Diagnosis not present

## 2024-01-22 DIAGNOSIS — I447 Left bundle-branch block, unspecified: Secondary | ICD-10-CM | POA: Diagnosis not present

## 2024-01-22 DIAGNOSIS — Z79899 Other long term (current) drug therapy: Secondary | ICD-10-CM | POA: Diagnosis not present

## 2024-01-22 DIAGNOSIS — I5022 Chronic systolic (congestive) heart failure: Secondary | ICD-10-CM

## 2024-01-22 DIAGNOSIS — I2510 Atherosclerotic heart disease of native coronary artery without angina pectoris: Secondary | ICD-10-CM | POA: Insufficient documentation

## 2024-01-22 DIAGNOSIS — I10 Essential (primary) hypertension: Secondary | ICD-10-CM | POA: Diagnosis present

## 2024-01-22 DIAGNOSIS — Z8673 Personal history of transient ischemic attack (TIA), and cerebral infarction without residual deficits: Secondary | ICD-10-CM | POA: Diagnosis not present

## 2024-01-22 DIAGNOSIS — R0789 Other chest pain: Secondary | ICD-10-CM | POA: Diagnosis not present

## 2024-01-22 DIAGNOSIS — K439 Ventral hernia without obstruction or gangrene: Secondary | ICD-10-CM | POA: Diagnosis not present

## 2024-01-22 DIAGNOSIS — Z86711 Personal history of pulmonary embolism: Secondary | ICD-10-CM | POA: Diagnosis not present

## 2024-01-22 DIAGNOSIS — R1031 Right lower quadrant pain: Secondary | ICD-10-CM | POA: Diagnosis not present

## 2024-01-22 DIAGNOSIS — E118 Type 2 diabetes mellitus with unspecified complications: Secondary | ICD-10-CM | POA: Diagnosis present

## 2024-01-22 DIAGNOSIS — R109 Unspecified abdominal pain: Secondary | ICD-10-CM

## 2024-01-22 DIAGNOSIS — I70201 Unspecified atherosclerosis of native arteries of extremities, right leg: Secondary | ICD-10-CM | POA: Insufficient documentation

## 2024-01-22 DIAGNOSIS — D3502 Benign neoplasm of left adrenal gland: Secondary | ICD-10-CM | POA: Diagnosis not present

## 2024-01-22 LAB — BRAIN NATRIURETIC PEPTIDE: B Natriuretic Peptide: 86 pg/mL (ref 0.0–100.0)

## 2024-01-22 LAB — CBC WITH DIFFERENTIAL/PLATELET
Abs Immature Granulocytes: 0.01 10*3/uL (ref 0.00–0.07)
Basophils Absolute: 0 10*3/uL (ref 0.0–0.1)
Basophils Relative: 0 %
Eosinophils Absolute: 0 10*3/uL (ref 0.0–0.5)
Eosinophils Relative: 0 %
HCT: 54.1 % — ABNORMAL HIGH (ref 39.0–52.0)
Hemoglobin: 17.1 g/dL — ABNORMAL HIGH (ref 13.0–17.0)
Immature Granulocytes: 0 %
Lymphocytes Relative: 20 %
Lymphs Abs: 1.3 10*3/uL (ref 0.7–4.0)
MCH: 26 pg (ref 26.0–34.0)
MCHC: 31.6 g/dL (ref 30.0–36.0)
MCV: 82.2 fL (ref 80.0–100.0)
Monocytes Absolute: 0.3 10*3/uL (ref 0.1–1.0)
Monocytes Relative: 5 %
Neutro Abs: 4.7 10*3/uL (ref 1.7–7.7)
Neutrophils Relative %: 75 %
Platelets: 336 10*3/uL (ref 150–400)
RBC: 6.58 MIL/uL — ABNORMAL HIGH (ref 4.22–5.81)
RDW: 18.6 % — ABNORMAL HIGH (ref 11.5–15.5)
WBC: 6.4 10*3/uL (ref 4.0–10.5)
nRBC: 0 % (ref 0.0–0.2)

## 2024-01-22 LAB — COMPREHENSIVE METABOLIC PANEL
ALT: 38 U/L (ref 0–44)
AST: 52 U/L — ABNORMAL HIGH (ref 15–41)
Albumin: 5 g/dL (ref 3.5–5.0)
Alkaline Phosphatase: 77 U/L (ref 38–126)
Anion gap: 15 (ref 5–15)
BUN: 21 mg/dL (ref 8–23)
CO2: 27 mmol/L (ref 22–32)
Calcium: 10.1 mg/dL (ref 8.9–10.3)
Chloride: 97 mmol/L — ABNORMAL LOW (ref 98–111)
Creatinine, Ser: 1.6 mg/dL — ABNORMAL HIGH (ref 0.61–1.24)
GFR, Estimated: 47 mL/min — ABNORMAL LOW (ref >60)
Glucose, Bld: 109 mg/dL — ABNORMAL HIGH (ref 70–99)
Potassium: 3.9 mmol/L (ref 3.5–5.1)
Sodium: 139 mmol/L (ref 135–145)
Total Bilirubin: 0.5 mg/dL (ref 0.0–1.2)
Total Protein: 9.5 g/dL — ABNORMAL HIGH (ref 6.5–8.1)

## 2024-01-22 LAB — URINALYSIS, W/ REFLEX TO CULTURE (INFECTION SUSPECTED)
Bacteria, UA: NONE SEEN
Bilirubin Urine: NEGATIVE
Glucose, UA: >=500 mg/dL — AB
Hgb urine dipstick: NEGATIVE
Ketones, ur: NEGATIVE mg/dL
Leukocytes,Ua: NEGATIVE
Nitrite: NEGATIVE
Protein, ur: NEGATIVE mg/dL
Specific Gravity, Urine: 1.005 (ref 1.005–1.030)
pH: 6 (ref 5.0–8.0)

## 2024-01-22 LAB — RAPID URINE DRUG SCREEN, HOSP PERFORMED
Amphetamines: NOT DETECTED
Barbiturates: NOT DETECTED
Benzodiazepines: NOT DETECTED
Cocaine: NOT DETECTED
Opiates: NOT DETECTED
Tetrahydrocannabinol: NOT DETECTED

## 2024-01-22 LAB — TROPONIN I (HIGH SENSITIVITY)
Troponin I (High Sensitivity): 29 ng/L — ABNORMAL HIGH (ref <18)
Troponin I (High Sensitivity): 25 ng/L — ABNORMAL HIGH (ref <18)

## 2024-01-22 LAB — RESP PANEL BY RT-PCR (RSV, FLU A&B, COVID)  RVPGX2
Influenza A by PCR: NEGATIVE
Influenza B by PCR: NEGATIVE
Resp Syncytial Virus by PCR: NEGATIVE
SARS Coronavirus 2 by RT PCR: NEGATIVE

## 2024-01-22 LAB — GLUCOSE, CAPILLARY: Glucose-Capillary: 306 mg/dL — ABNORMAL HIGH (ref 70–99)

## 2024-01-22 MED ORDER — DIGOXIN 125 MCG PO TABS
0.1250 mg | ORAL_TABLET | Freq: Every day | ORAL | Status: DC
  Administered 2024-01-23: 0.125 mg via ORAL
  Filled 2024-01-22: qty 1

## 2024-01-22 MED ORDER — METHYLPREDNISOLONE SODIUM SUCC 40 MG IJ SOLR
40.0000 mg | Freq: Once | INTRAMUSCULAR | Status: AC
  Administered 2024-01-22: 40 mg via INTRAVENOUS
  Filled 2024-01-22: qty 1

## 2024-01-22 MED ORDER — PANTOPRAZOLE SODIUM 40 MG PO TBEC
40.0000 mg | DELAYED_RELEASE_TABLET | Freq: Every day | ORAL | Status: DC
  Administered 2024-01-22 – 2024-01-23 (×2): 40 mg via ORAL
  Filled 2024-01-22 (×2): qty 1

## 2024-01-22 MED ORDER — INSULIN ASPART 100 UNIT/ML IJ SOLN
0.0000 [IU] | Freq: Every day | INTRAMUSCULAR | Status: DC
  Administered 2024-01-22: 4 [IU] via SUBCUTANEOUS

## 2024-01-22 MED ORDER — SPIRONOLACTONE 25 MG PO TABS
25.0000 mg | ORAL_TABLET | Freq: Every day | ORAL | Status: DC
  Administered 2024-01-23: 25 mg via ORAL
  Filled 2024-01-22: qty 1

## 2024-01-22 MED ORDER — FENTANYL CITRATE PF 50 MCG/ML IJ SOSY
25.0000 ug | PREFILLED_SYRINGE | Freq: Once | INTRAMUSCULAR | Status: AC
  Administered 2024-01-22: 25 ug via INTRAVENOUS
  Filled 2024-01-22: qty 1

## 2024-01-22 MED ORDER — ACETAMINOPHEN 325 MG PO TABS
650.0000 mg | ORAL_TABLET | ORAL | Status: DC | PRN
  Administered 2024-01-23: 650 mg via ORAL
  Filled 2024-01-22: qty 2

## 2024-01-22 MED ORDER — ONDANSETRON HCL 4 MG/2ML IJ SOLN: 4.0000 mg | Freq: Four times a day (QID) | INTRAMUSCULAR | Status: DC | PRN

## 2024-01-22 MED ORDER — INSULIN ASPART 100 UNIT/ML IJ SOLN
0.0000 [IU] | Freq: Three times a day (TID) | INTRAMUSCULAR | Status: DC
  Administered 2024-01-23: 2 [IU] via SUBCUTANEOUS

## 2024-01-22 MED ORDER — EZETIMIBE 10 MG PO TABS
10.0000 mg | ORAL_TABLET | Freq: Every day | ORAL | Status: DC
  Administered 2024-01-23: 10 mg via ORAL
  Filled 2024-01-22: qty 1

## 2024-01-22 MED ORDER — AMIODARONE HCL 200 MG PO TABS
200.0000 mg | ORAL_TABLET | Freq: Every day | ORAL | Status: DC
  Administered 2024-01-23: 200 mg via ORAL
  Filled 2024-01-22: qty 1

## 2024-01-22 MED ORDER — APIXABAN 5 MG PO TABS
5.0000 mg | ORAL_TABLET | Freq: Two times a day (BID) | ORAL | Status: DC
  Administered 2024-01-22 – 2024-01-23 (×2): 5 mg via ORAL
  Filled 2024-01-22 (×2): qty 1

## 2024-01-22 MED ORDER — SACUBITRIL-VALSARTAN 24-26 MG PO TABS
1.0000 | ORAL_TABLET | Freq: Two times a day (BID) | ORAL | Status: DC
Start: 2024-01-23 — End: 2024-01-24
  Administered 2024-01-23: 1 via ORAL
  Filled 2024-01-22: qty 1

## 2024-01-22 MED ORDER — GABAPENTIN 100 MG PO CAPS
100.0000 mg | ORAL_CAPSULE | Freq: Two times a day (BID) | ORAL | Status: DC
  Administered 2024-01-22 – 2024-01-23 (×2): 100 mg via ORAL
  Filled 2024-01-22 (×2): qty 1

## 2024-01-22 MED ORDER — NITROGLYCERIN 0.4 MG SL SUBL: 0.4000 mg | SUBLINGUAL_TABLET | SUBLINGUAL | Status: DC | PRN

## 2024-01-22 MED ORDER — IOHEXOL 350 MG/ML SOLN
80.0000 mL | Freq: Once | INTRAVENOUS | Status: AC | PRN
  Administered 2024-01-22: 80 mL via INTRAVENOUS

## 2024-01-22 MED ORDER — DIPHENHYDRAMINE HCL 50 MG/ML IJ SOLN
50.0000 mg | Freq: Once | INTRAMUSCULAR | Status: AC
  Filled 2024-01-22: qty 1

## 2024-01-22 MED ORDER — EMPAGLIFLOZIN 10 MG PO TABS
10.0000 mg | ORAL_TABLET | Freq: Every day | ORAL | Status: DC
  Administered 2024-01-23: 10 mg via ORAL
  Filled 2024-01-22: qty 1

## 2024-01-22 MED ORDER — SODIUM CHLORIDE 0.9 % IV BOLUS
250.0000 mL | Freq: Once | INTRAVENOUS | Status: AC
  Administered 2024-01-22: 250 mL via INTRAVENOUS

## 2024-01-22 MED ORDER — DIPHENHYDRAMINE HCL 25 MG PO CAPS
50.0000 mg | ORAL_CAPSULE | Freq: Once | ORAL | Status: AC
  Administered 2024-01-22: 50 mg via ORAL
  Filled 2024-01-22: qty 2

## 2024-01-22 NOTE — Progress Notes (Signed)
 Patient arrived to room 317 via w/c from ED.  Patient is alert & oriented x 4.  Patient uses a can for ambulation and has had multiple falls at the ALF.  Patient c/o right sided chest pain at this time.  He rates the pain at a 8/10 and states that it is a "squeezing".  Patient currently sitting in the bed eating a "frozen meal" and drinking a drink.

## 2024-01-22 NOTE — ED Provider Notes (Signed)
 [place] EMERGENCY DEPARTMENT AT [facility] Provider Note   CSN: [id number] Arrival date & time: 01/22/24  5784     History {Add pertinent medical, surgical, social history, OB history to HPI:1} Chief Complaint  Patient presents with   Chest Pain    Daniel Li is a 68 y.o. male.  Patient is a 68 year old male who presents to the emergency department with a chief complaint of right lower quadrant abdominal pain, chest pain, cough, congestion, rhinorrhea, nausea which has been ongoing for approximate the past 4 days.  Patient notes that he has had no increased edema.  He denies any associated shortness of breath, dizziness, lightheadedness or syncope.  He does admit to intermittent fevers.  He denies any associated vomiting or diarrhea.  He denies any changes in urination to include dysuria or hematuria.  He has had no associated numbness, paresthesias or unilateral weakness.   Chest Pain Associated symptoms: abdominal pain, cough and nausea        Home Medications Prior to Admission medications   Medication Sig Start Date End Date Taking? Authorizing Provider  acetaminophen (TYLENOL) 325 MG tablet Take 650 mg by mouth every 6 (six) hours as needed for mild pain.    [provider]  albuterol (VENTOLIN HFA) 108 (90 Base) MCG/ACT inhaler Inhale 2 puffs into the lungs every 4 (four) hours as needed for wheezing or shortness of breath. Patient not taking: Reported on 12/26/2023 06/19/23   Eber Hong, MD  amiodarone (PACERONE) 200 MG tablet Take 1 tablet (200 mg total) by mouth daily. 07/16/23   Andrey Farmer, PA-C  apixaban (ELIQUIS) 5 MG TABS tablet Take 5 mg by mouth 2 (two) times daily.    [provider]  ASPIRIN 81 PO Take 81 mg by mouth daily.    [provider]  digoxin (LANOXIN) 0.125 MG tablet TAKE (1) TABLET BY MOUTH ONCE DAILY. 09/09/23   Laurey Morale, MD  empagliflozin (JARDIANCE) 10 MG TABS tablet Take 10 mg by  mouth daily.    [provider]  ENTRESTO 24-26 MG TAKE (1) TABLET BY MOUTH TWICE DAILY. 01/09/24   Laurey Morale, MD  ezetimibe (ZETIA) 10 MG tablet Take 1 tablet (10 mg total) by mouth daily. 03/27/17   Antoine Poche, MD  gabapentin (NEURONTIN) 100 MG capsule Take 100 mg by mouth 2 (two) times daily.    [provider]  lidocaine (LIDODERM) 5 % Place 1 patch onto the skin every 12 (twelve) hours. Leave off 12 hours 05/27/23   [provider]  metFORMIN (GLUCOPHAGE) 500 MG tablet Take 500 mg by mouth daily with breakfast.    [provider]  nitroGLYCERIN (NITROSTAT) 0.4 MG SL tablet PLACE ONE (1) TABLET UNDER TONGUE EVERY 5 MINUTES UP TO (3) DOSES AS NEEDED FOR CHEST PAIN. IF NO RELIEF, CONTACT MD. Patient not taking: Reported on 12/26/2023 09/29/19   Antoine Poche, MD  pantoprazole (PROTONIX) 40 MG tablet Take 40 mg by mouth daily.    [provider]  potassium chloride (KLOR-CON) 10 MEQ tablet Take 2 tablets (20 mEq total) by mouth daily. 11/06/23 12/06/23  Laurey Morale, MD  spironolactone (ALDACTONE) 25 MG tablet Take 1 tablet (25 mg total) by mouth daily. 06/10/23   Laurey Morale, MD  torsemide (DEMADEX) 20 MG tablet Take 2 tablets (40 mg total) by mouth daily. 11/06/23 12/06/23  Laurey Morale, MD  traMADol (ULTRAM) 50 MG tablet Take 50 mg by mouth  every 6 (six) hours as needed.    [provider]      Allergies    Contrast media [iodinated contrast media] and Pork-derived products    Review of Systems   Review of Systems  HENT:  Positive for rhinorrhea.   Respiratory:  Positive for cough.   Cardiovascular:  Positive for chest pain.  Gastrointestinal:  Positive for abdominal pain and nausea.  All other systems reviewed and are negative.   Physical Exam Updated Vital Signs BP 92/65   Pulse 73   Temp 97.9 F (36.6 C) (Oral)   Resp 20   Ht 5' 6.5" (1.689 m)   Wt 64.4 kg   SpO2 97%   BMI 22.58 kg/m  Physical  Exam Vitals and nursing note reviewed.  Constitutional:      Appearance: Normal appearance.  HENT:     Head: Normocephalic and atraumatic.     Nose: Nose normal.     Mouth/Throat:     Mouth: Mucous membranes are moist.  Eyes:     Extraocular Movements: Extraocular movements intact.     Conjunctiva/sclera: Conjunctivae normal.     Pupils: Pupils are equal, round, and reactive to light.  Cardiovascular:     Rate and Rhythm: Normal rate and regular rhythm.     Pulses: Normal pulses.     Heart sounds: Normal heart sounds.  Pulmonary:     Effort: Pulmonary effort is normal. No tachypnea or respiratory distress.     Breath sounds: Normal breath sounds. No stridor. No decreased breath sounds, wheezing, rhonchi or rales.  Chest:     Chest wall: No tenderness or edema.  Abdominal:     General: Abdomen is flat. Bowel sounds are normal.     Palpations: Abdomen is soft. There is no mass.     Tenderness: There is no guarding or rebound.     Comments: Tender to palpation to right lower quadrant  Musculoskeletal:        General: Normal range of motion.     Cervical back: Normal range of motion and neck supple.  Skin:    General: Skin is warm and dry.  Neurological:     General: No focal deficit present.     Mental Status: He is alert and oriented to person, place, and time. Mental status is at baseline.  Psychiatric:        Mood and Affect: Mood normal.        Behavior: Behavior normal.        Thought Content: Thought content normal.        Judgment: Judgment normal.     ED Results / Procedures / Treatments   Labs (all labs ordered are listed, but only abnormal results are displayed) Labs Reviewed  CBC WITH DIFFERENTIAL/PLATELET - Abnormal; Notable for the following components:      Result Value   RBC 6.58 (*)    Hemoglobin 17.1 (*)    HCT 54.1 (*)    RDW 18.6 (*)    All other components within normal limits  RESP PANEL BY RT-PCR (RSV, FLU A&B, COVID)  [id number]  BRAIN  NATRIURETIC PEPTIDE  COMPREHENSIVE METABOLIC PANEL  TROPONIN I (HIGH SENSITIVITY)    EKG EKG Interpretation Date/Time:  Wednesday January 22 2024 09:49:03 EDT Ventricular Rate:  73 PR Interval:  198 QRS Duration:  122 QT Interval:  465 QTC Calculation: 513 R Axis:   5  Text Interpretation: Sinus rhythm Left bundle branch block No acute changes No significant  change since last tracing Confirmed by Derwood Kaplan [id number] on 01/22/2024 9:51:35 AM  Radiology No results found.  Procedures Procedures  {Document cardiac monitor, telemetry assessment procedure when appropriate:1}  Medications Ordered in ED Medications  sodium chloride 0.9 % bolus 250 mL (250 mLs Intravenous New Bag/Given 01/22/24 1031)    ED Course/ Medical Decision Making/ A&P   {   Click here for ABCD2, HEART and other calculatorsREFRESH Note before signing :1}                              Medical Decision Making Amount and/or Complexity of Data Reviewed Labs: ordered. Radiology: ordered.  Risk Prescription drug management. Decision regarding hospitalization.   This patient presents to the ED for concern of pain, abdominal pain, right leg pain, this involves an extensive number of treatment options, and is a complaint that carries with it a high risk of complications and morbidity.  The differential diagnosis includes ACS, pulmonary embolus, pericarditis, myocarditis, endocarditis, acute cholecystitis, acute appendicitis, small bowel obstruction, diverticulitis, acute arterial occlusion   Co morbidities that complicate the patient evaluation  Hypertension, lipidemia, diabetes, CAD, CHF   Additional history obtained:  Additional history obtained from medical records External records from outside source obtained and reviewed including none   Lab Tests:  I Ordered, and personally interpreted labs.  The pertinent results include: Elevated troponin, normal BNP, elevated creatinine, normal electrolytes,  negative urinalysis   Imaging Studies ordered:  I ordered imaging studies including CTA of chest, abdomen and pelvis with runoff I independently visualized and interpreted imaging which showed abdominal aortic aneurysm, occlusion of the right peroneal artery I agree with the radiologist interpretation   Cardiac Monitoring: / EKG:  The patient was maintained on a cardiac monitor.  I personally viewed and interpreted the cardiac monitored which showed an underlying rhythm of: Left bundle branch block, no ST/T wave changes, no ischemic changes, no STEMI   Consultations Obtained:  I requested consultation with the vascular surgery, cardiology,  and discussed lab and imaging findings as well as pertinent plan - they recommend: Vascular surgery recommends outpatient follow-up for the abdominal aortic aneurysm and perineal occlusion, cardiology does recommend admission for chest pain rule out   Problem List / ED Course / Critical interventions / Medication management  Patient does remain stable at this time though continues to have ongoing chest pain.  Discussed patient case with Dr. Myra Gianotti with vascular surgery who does note that the patient can follow-up on an outpatient basis regarding the peroneal occlusion as well as the abdominal aortic aneurysm.  Patient has intact DP pulses in the right lower extremity.  He has intact sensation and movement in bilateral lower extremities as well.  Did touch base with Dr. Anne Fu with cardiology who is in agreement for admission for chest pain rule out at this time given patient's high risk history.  Patient is in agreement to this plan at this time.  EKG and troponins have been stable thus far.  Patient's blood pressure has been at his baseline. I ordered medication including IV fluids, fentanyl for elevated creatinine, chest pain Reevaluation of the patient after these medicines showed that the patient improved I have reviewed the patients home medicines  and have made adjustments as needed   Social Determinants of Health:  None   Test / Admission - Considered:  Admission  {Document critical care time when appropriate:1} {Document review of labs and clinical decision tools  ie heart score, Chads2Vasc2 etc:1}  {Document your independent review of radiology images, and any outside records:1} {Document your discussion with family members, caretakers, and with consultants:1} {Document social determinants of health affecting pt's care:1} {Document your decision making why or why not admission, treatments were needed:1} Final Clinical Impression(s) / ED Diagnoses Final diagnoses:  None    Rx / DC Orders ED Discharge Orders     None

## 2024-01-22 NOTE — H&P (Signed)
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 thickening, or biliary dilatation. Pancreas: Unremarkable. No pancreatic ductal dilatation or surrounding inflammatory changes. Spleen: Normal in size without focal abnormality. Adrenals/Urinary Tract: There is predominantly stable diffuse bilateral adrenal gland enlargement. Kidneys are normal in size, without renal calculi or hydronephrosis. Focal scarring is seen along the posterolateral aspect of the mid right kidney. Bladder is unremarkable. Stomach/Bowel: Stomach is within normal limits. Appendix appears normal. Surgically anastomosed bowel is seen within the mid left abdomen. No evidence of bowel wall thickening, distention, or inflammatory changes. Vascular/Lymphatic: Aortic atherosclerosis with stable 3.5 cm diameter aneurysmal dilatation of the infrarenal abdominal aorta. No enlarged abdominal or pelvic lymph nodes. Reproductive: The prostate gland is mildly enlarged. Other:  A 2.5 cm x 9.1 cm x 4.5 cm fat containing ventral hernia is noted along the midline of the mid abdomen. No abdominopelvic ascites. Musculoskeletal: Marked severity multilevel degenerative changes seen throughout the lumbar spine. IMPRESSION: 1. No acute or active process within the abdomen or pelvis. 2. Stable 3.5 cm diameter aneurysmal dilatation of the infrarenal abdominal aorta. Recommend follow-up ultrasound every 3 years. (Ref.: J Vasc Surg. 2018; 67:2-77 and J Am Coll Radiol 2013;10(10):789-794.) 3. Fat-containing ventral hernia. 4. 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 5. Aortic atherosclerosis. Aortic Atherosclerosis (ICD10-I70.0). Electronically Signed   By: Aram Candela M.D.   On: 01/22/2024 11:18   DG Chest Port 1 View Result Date: 01/22/2024 CLINICAL DATA:  Chest pain EXAM: PORTABLE CHEST 1 VIEW COMPARISON:  08/07/2023 FINDINGS: Left AICD remains in place,  unchanged. Heart and mediastinal contours are within normal limits. No focal opacities or effusions. No acute bony abnormality. IMPRESSION: No active disease. Electronically Signed   By: Charlett Nose M.D.   On: 01/22/2024 10:36     EKG: Vent. rate 73 BPM PR interval 198 ms QRS duration 122 ms QT/QTcB 465/513 ms P-R-T axes 55 5 61 Sinus rhythm Left bundle branch block  Assessment & Plan:    Active Problems:   Diabetes mellitus type 2 with complications (HCC)   Benign essential HTN   Chronic combined systolic (congestive) and diastolic (congestive) heart failure (HCC)   ICD (implantable cardioverter-defibrillator) in place   Chest pain   Acute on chronic renal failure (HCC)   CAD in native artery   Chest pain -Some nontypical features, which was reproducible by palpation, starts at rest. -Given his extensive cardiac history and CAD he will be admitted for  observation. -cycle troponins -Request cardiology consult for further recommendations  Chronic systolic CHF:   Cardiomyopathy -Patient with known ischemic cardiomyopathy 20 to 25% 10/24 TEE. - S/p [employer] ICD. -Appears to be a euvolemic on the exam today, actually he might be on the dry side as blood pressure is soft and kidney function is elevated -Continue home medication including Entresto, aldactone, Jardiance, digoxin, will hold torsemide for now -Digoxin level    CAD: - Extensive CAD history with multiple prior Mis and stents - Last cath in 2023 with patent stents, occluded LCX and moderate diffuse disease elsewhere treated medically -Not on aspirin as he is on Eliquis,  Imaging finding 3.4 cm infrarenal abdominal aortic aneurysm. -Radiology report recommend follow-up ultrasound every 3 years.  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 -ED discussed with vascular surgery, patient with no evidence of critical ischemia, or any complaints, he had good PT pulses, no cyanosis, on Eliquis, and vascular surgery recommended outpatient follow-up -Patient will need ambulatory referral for vascular surgery as an outpatient  Hx LV thrombus -Was on warfarin, currently on Eliquis   H/o NSVT -Has ICD, continue with amiodarone.  History of tobacco abuse and drug abuse -stopped both smoking and doing drugs, his urine drug screen is negative, he was congratulated about that  AKI CKD stage III A -Creatinine is elevated, will hold on torsemide specially he received IV contrast  GERD -Continue with PPI  Diabetes mellitus, type II -Hold metformin, continue with Jardiance, will add insulin sliding scale, will check A1c  AM Labs Ordered, also please review Full Orders  Family Communication: Admission, patients condition and plan of care including tests being ordered have been discussed with the patient  who indicate understanding and agree with the plan and Code Status.  Code Status full code  Likely DC to home   Consults called: Cardiology consult  requested in epic  Admission status: Observation  Time spent in minutes : 70 minutes   Huey Bienenstock M.D on 01/22/2024 at 8:36 PM   [facility] - Office  [phone]

## 2024-01-22 NOTE — ED Triage Notes (Signed)
 Pt c/o chest pain for 4 days and became worse this morning.

## 2024-01-22 NOTE — Discharge Instructions (Addendum)
IMPORTANT INFORMATION: PAY CLOSE ATTENTION   PHYSICIAN DISCHARGE INSTRUCTIONS  Follow with Primary care provider  Vyas, Dhruv B, MD  and other consultants as instructed by your Hospitalist Physician  SEEK MEDICAL CARE OR RETURN TO EMERGENCY ROOM IF SYMPTOMS COME BACK, WORSEN OR NEW PROBLEM DEVELOPS   Please note: You were cared for by a hospitalist during your hospital stay. Every effort will be made to forward records to your primary care provider.  You can request that your primary care provider send for your hospital records if they have not received them.  Once you are discharged, your primary care physician will handle any further medical issues. Please note that NO REFILLS for any discharge medications will be authorized once you are discharged, as it is imperative that you return to your primary care physician (or establish a relationship with a primary care physician if you do not have one) for your post hospital discharge needs so that they can reassess your need for medications and monitor your lab values.  Please get a complete blood count and chemistry panel checked by your Primary MD at your next visit, and again as instructed by your Primary MD.  Get Medicines reviewed and adjusted: Please take all your medications with you for your next visit with your Primary MD  Laboratory/radiological data: Please request your Primary MD to go over all hospital tests and procedure/radiological results at the follow up, please ask your primary care provider to get all Hospital records sent to his/her office.  In some cases, they will be blood work, cultures and biopsy results pending at the time of your discharge. Please request that your primary care provider follow up on these results.  If you are diabetic, please bring your blood sugar readings with you to your follow up appointment with primary care.    Please call and make your follow up appointments as soon as possible.    Also Note  the following: If you experience worsening of your admission symptoms, develop shortness of breath, life threatening emergency, suicidal or homicidal thoughts you must seek medical attention immediately by calling 911 or calling your MD immediately  if symptoms less severe.  You must read complete instructions/literature along with all the possible adverse reactions/side effects for all the Medicines you take and that have been prescribed to you. Take any new Medicines after you have completely understood and accpet all the possible adverse reactions/side effects.   Do not drive when taking Pain medications or sleeping medications (Benzodiazepines)  Do not take more than prescribed Pain, Sleep and Anxiety Medications. It is not advisable to combine anxiety,sleep and pain medications without talking with your primary care practitioner  Special Instructions: If you have smoked or chewed Tobacco  in the last 2 yrs please stop smoking, stop any regular Alcohol  and or any Recreational drug use.  Wear Seat belts while driving.  Do not drive if taking any narcotic, mind altering or controlled substances or recreational drugs or alcohol.       

## 2024-01-22 NOTE — Progress Notes (Signed)
 No ICM remote transmission received for 01/20/2024 and next ICM transmission scheduled for 02/03/2024.

## 2024-01-22 NOTE — ED Notes (Signed)
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 CLINICAL DATA:  Right-sided abdominal pain. EXAM: CT ABDOMEN AND PELVIS WITHOUT CONTRAST TECHNIQUE: Multidetector CT imaging of the abdomen and pelvis was performed following the standard protocol without IV contrast. RADIATION DOSE REDUCTION: This exam was performed according to the departmental dose-optimization program which includes automated exposure control, adjustment of the mA and/or kV according to patient size and/or use of iterative reconstruction technique. COMPARISON:  May 12, 2018 FINDINGS: Lower chest: No acute abnormality. Hepatobiliary: No focal liver abnormality is seen. No gallstones, gallbladder wall thickening, or biliary dilatation. Pancreas: Unremarkable. No pancreatic ductal dilatation or surrounding inflammatory changes. Spleen: Normal in size without focal abnormality. Adrenals/Urinary Tract:  There is predominantly stable diffuse bilateral adrenal gland enlargement. Kidneys are normal in size, without renal calculi or hydronephrosis. Focal scarring is seen along the posterolateral aspect of the mid right kidney. Bladder is unremarkable. Stomach/Bowel: Stomach is within normal limits. Appendix appears normal. Surgically anastomosed bowel is seen within the mid left abdomen. No evidence of bowel wall thickening,  distention, or inflammatory changes. Vascular/Lymphatic: Aortic atherosclerosis with stable 3.5 cm diameter aneurysmal dilatation of the infrarenal abdominal aorta. No enlarged abdominal or pelvic lymph nodes. Reproductive: The prostate gland is mildly enlarged. Other: A 2.5 cm x 9.1 cm x 4.5 cm fat containing ventral hernia is noted along the midline of the mid abdomen. No abdominopelvic ascites. Musculoskeletal: Marked severity multilevel degenerative changes seen throughout the lumbar spine. IMPRESSION: 1. No acute or active process within the abdomen or pelvis. 2. Stable 3.5 cm diameter aneurysmal dilatation of the infrarenal abdominal aorta. Recommend follow-up ultrasound every 3 years. (Ref.: J Vasc Surg. 2018; 67:2-77 and J Am Coll Radiol 2013;10(10):789-794.) 3. Fat-containing ventral hernia. 4. 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 5. Aortic atherosclerosis. Aortic Atherosclerosis (ICD10-I70.0). Electronically Signed   By: Aram Candela M.D.   On: 01/22/2024 11:18   DG Chest Port 1 View Result Date: 01/22/2024 CLINICAL DATA:  Chest pain EXAM: PORTABLE CHEST 1 VIEW COMPARISON:  08/07/2023 FINDINGS: Left AICD remains in place, unchanged. Heart and mediastinal contours are within normal limits. No focal opacities or effusions. No acute bony abnormality. IMPRESSION: No active disease. Electronically Signed   By: Charlett Nose M.D.   On: 01/22/2024 10:36    Pending Labs Unresulted Labs (From admission, onward)     Start     Ordered   01/23/24  0500  HIV Antibody (routine testing w rflx)  (HIV Antibody (Routine testing w reflex) panel)  Tomorrow morning,   R        01/22/24 2002   01/22/24 1958  Rapid urine drug screen (hospital performed)  ONCE - STAT,   STAT        01/22/24 1957            Vitals/Pain Today's Vitals   01/22/24 1932 01/22/24 1942 01/22/24 1948 01/22/24 1949  BP:    118/83  Pulse:    65  Resp:    16  Temp:    97.7 F (36.5 C)  TempSrc:      SpO2:   98% 98%  Weight:      Height:      PainSc: 8  8   8      Isolation Precautions No active isolations  Medications Medications  acetaminophen (TYLENOL) tablet 650 mg (has no administration in time range)  ondansetron (ZOFRAN) injection 4 mg (has no administration in time range)  sodium chloride 0.9 % bolus 250 mL (0 mLs Intravenous Stopped 01/22/24 1130)  methylPREDNISolone sodium succinate (SOLU-MEDROL) 40 mg/mL injection 40 mg (40 mg Intravenous Given 01/22/24 1255)  diphenhydrAMINE (BENADRYL) capsule 50 mg (50 mg Oral Given 01/22/24 1610)    Or  diphenhydrAMINE (BENADRYL) injection 50 mg ( Intravenous See Alternative 01/22/24 1610)  sodium chloride 0.9 % bolus 250 mL (0 mLs Intravenous Stopped 01/22/24 1500)  iohexol (OMNIPAQUE) 350 MG/ML injection 80 mL (80 mLs Intravenous Contrast Given 01/22/24 1651)  fentaNYL (SUBLIMAZE) injection 25 mcg (25 mcg Intravenous Given 01/22/24 1942)    Mobility walks with device     Focused Assessments Cardiac Assessment Handoff:    Lab Results  Component Value Date   CKTOTAL 286 02/22/2017   CKMB 34.8 (HH) 11/26/2012   TROPONINI 0.03 (HH) 09/20/2018   Lab Results  Component Value Date   DDIMER 0.54 (H) 07/19/2014   Does the Patient currently have chest pain? Yes    R Recommendations: See Admitting Provider Note  Report given to:   Additional Notes: Was given fentanyl for his pain  with minimal relief. Has pain in right upper chest and right leg. Normally walks with a cane. Has been using a urinal  independently.

## 2024-01-22 NOTE — ED Triage Notes (Signed)
 Pt c/o right abd pain and right leg pain with weakness for a few days.

## 2024-01-22 NOTE — ED Notes (Signed)
 Patient transported to CT

## 2024-01-22 NOTE — Progress Notes (Signed)
   01/22/24 2246  TOC Brief Assessment  Insurance and Status Reviewed  Patient has primary care physician Yes  Home environment has been reviewed ALF  Prior level of function: Assisted  Prior/Current Home Services No current home services  Social Drivers of Health Review SDOH reviewed no interventions necessary  Readmission risk has been reviewed Yes  Transition of care needs no transition of care needs at this time   Pt from [facility] in [place]. Plans to return there at dc. Has a four-point cane.

## 2024-01-22 NOTE — Care Management Obs Status (Signed)
 MEDICARE OBSERVATION STATUS NOTIFICATION   Patient Details  Name: Daniel Li MRN: [id number] Date of Birth: Apr 23, 1956   Medicare Observation Status Notification Given:  Yes    Barron Alvine, RN 01/22/2024, 10:23 PM

## 2024-01-23 ENCOUNTER — Observation Stay (HOSPITAL_BASED_OUTPATIENT_CLINIC_OR_DEPARTMENT_OTHER)

## 2024-01-23 ENCOUNTER — Other Ambulatory Visit (HOSPITAL_COMMUNITY): Payer: Self-pay | Admitting: *Deleted

## 2024-01-23 ENCOUNTER — Other Ambulatory Visit (HOSPITAL_COMMUNITY): Payer: 59

## 2024-01-23 DIAGNOSIS — Z95810 Presence of automatic (implantable) cardiac defibrillator: Secondary | ICD-10-CM

## 2024-01-23 DIAGNOSIS — I5022 Chronic systolic (congestive) heart failure: Secondary | ICD-10-CM | POA: Diagnosis not present

## 2024-01-23 DIAGNOSIS — I2510 Atherosclerotic heart disease of native coronary artery without angina pectoris: Secondary | ICD-10-CM | POA: Diagnosis not present

## 2024-01-23 DIAGNOSIS — E118 Type 2 diabetes mellitus with unspecified complications: Secondary | ICD-10-CM

## 2024-01-23 DIAGNOSIS — I5042 Chronic combined systolic (congestive) and diastolic (congestive) heart failure: Secondary | ICD-10-CM | POA: Diagnosis not present

## 2024-01-23 DIAGNOSIS — N179 Acute kidney failure, unspecified: Secondary | ICD-10-CM | POA: Diagnosis not present

## 2024-01-23 DIAGNOSIS — R079 Chest pain, unspecified: Secondary | ICD-10-CM | POA: Diagnosis not present

## 2024-01-23 DIAGNOSIS — I10 Essential (primary) hypertension: Secondary | ICD-10-CM | POA: Diagnosis not present

## 2024-01-23 DIAGNOSIS — R0789 Other chest pain: Principal | ICD-10-CM

## 2024-01-23 DIAGNOSIS — I513 Intracardiac thrombosis, not elsewhere classified: Secondary | ICD-10-CM

## 2024-01-23 LAB — TROPONIN I (HIGH SENSITIVITY): Troponin I (High Sensitivity): 20 ng/L — ABNORMAL HIGH (ref <18)

## 2024-01-23 LAB — BASIC METABOLIC PANEL WITH GFR
Anion gap: 11 (ref 5–15)
BUN: 17 mg/dL (ref 8–23)
CO2: 24 mmol/L (ref 22–32)
Calcium: 9 mg/dL (ref 8.9–10.3)
Chloride: 100 mmol/L (ref 98–111)
Creatinine, Ser: 1.08 mg/dL (ref 0.61–1.24)
GFR, Estimated: >60 mL/min (ref >60)
Glucose, Bld: 122 mg/dL — ABNORMAL HIGH (ref 70–99)
Potassium: 3.9 mmol/L (ref 3.5–5.1)
Sodium: 135 mmol/L (ref 135–145)

## 2024-01-23 LAB — CBC
HCT: 41.8 % (ref 39.0–52.0)
Hemoglobin: 13.5 g/dL (ref 13.0–17.0)
MCH: 26.3 pg (ref 26.0–34.0)
MCHC: 32.3 g/dL (ref 30.0–36.0)
MCV: 81.5 fL (ref 80.0–100.0)
Platelets: 268 10*3/uL (ref 150–400)
RBC: 5.13 MIL/uL (ref 4.22–5.81)
RDW: 17 % — ABNORMAL HIGH (ref 11.5–15.5)
WBC: 10.8 10*3/uL — ABNORMAL HIGH (ref 4.0–10.5)
nRBC: 0 % (ref 0.0–0.2)

## 2024-01-23 LAB — DIGOXIN LEVEL: Digoxin Level: 0.8 ng/mL (ref 0.8–2.0)

## 2024-01-23 LAB — ECHOCARDIOGRAM COMPLETE
Area-P 1/2: 2.32 cm2
Height: 66 in
MV M vel: 4.13 m/s
MV Peak grad: 68.2 mmHg
S' Lateral: 6 cm
Single Plane A4C EF: 23.6 %
Weight: 2313.95 [oz_av]

## 2024-01-23 LAB — HEMOGLOBIN A1C
Hgb A1c MFr Bld: 6.8 % — ABNORMAL HIGH (ref 4.8–5.6)
Mean Plasma Glucose: 148.46 mg/dL

## 2024-01-23 LAB — GLUCOSE, CAPILLARY
Glucose-Capillary: 115 mg/dL — ABNORMAL HIGH (ref 70–99)
Glucose-Capillary: 174 mg/dL — ABNORMAL HIGH (ref 70–99)
Glucose-Capillary: 111 mg/dL — ABNORMAL HIGH (ref 70–99)

## 2024-01-23 LAB — HIV ANTIBODY (ROUTINE TESTING W REFLEX): HIV Screen 4th Generation wRfx: NONREACTIVE

## 2024-01-23 MED ORDER — SODIUM CHLORIDE 0.9 % IV BOLUS
250.0000 mL | Freq: Once | INTRAVENOUS | Status: AC
  Administered 2024-01-23: 250 mL via INTRAVENOUS

## 2024-01-23 MED ORDER — PERFLUTREN LIPID MICROSPHERE
1.0000 mL | INTRAVENOUS | Status: AC | PRN
  Administered 2024-01-23: 4 mL via INTRAVENOUS

## 2024-01-23 MED ORDER — TORSEMIDE 20 MG PO TABS: 20.0000 mg | ORAL_TABLET | Freq: Every day | ORAL | Status: DC

## 2024-01-23 NOTE — Consult Note (Signed)
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 appears within normal limits. Heart: The heart is moderately enlarged. There is no pericardial effusion. Pacemaker is present. Abdominal aorta: Calcified atherosclerotic disease is present. There is an infrarenal abdominal aortic aneurysm measuring 3.4 by 3.1 cm containing a small amount of noncalcified plaque. There is no significant stenosis or dissection. Celiac: Patent without evidence of aneurysm, dissection, vasculitis or significant stenosis. SMA: Patent without evidence of aneurysm, dissection, vasculitis or significant stenosis. Renals: Both renal arteries are patent without evidence of aneurysm, dissection, vasculitis, fibromuscular dysplasia or significant stenosis. IMA: Patent without evidence of aneurysm, dissection, vasculitis or significant stenosis. RIGHT Lower Extremity Inflow: Common, internal and external iliac arteries are patent without evidence of aneurysm, dissection, vasculitis or  significant stenosis. Outflow: Common, superficial and profunda femoral arteries and the popliteal artery are patent without evidence of aneurysm, dissection, vasculitis or significant stenosis. Runoff: Focal occlusion is seen in the tibial peroneal trunk distally and in the proximal peroneal artery. Otherwise, there is patency of 3 vessels to the distal tibia/fibula. Vessel patency is not seen at the level of the ankle which may be related to timing of the contrast bolus. LEFT Lower Extremity Inflow: Common, internal and external iliac arteries are patent without evidence of aneurysm, dissection, vasculitis or significant stenosis. Outflow: Common, superficial and profunda femoral arteries and the popliteal artery are patent without evidence of aneurysm, dissection, vasculitis or significant stenosis. Calcified atherosclerotic disease is present. Runoff: Patent three vessel runoff to the distal tibia/fibula. Patency not confirmed at the level of the ankle which may be related to timing of the contrast bolus. Veins: No obvious venous abnormality within the limitations of this arterial phase study. Review of the MIP images confirms the above findings. NON-VASCULAR Chest Mediastinum: Visualized thyroid gland is within normal limits. There are no enlarged mediastinal, hilar or axillary lymph nodes. Esophagus is within normal limits. Lungs: Mild emphysematous changes are present. There is minimal atelectatic change in the bilateral lower lobes. There is no pleural effusion or pneumothorax. There some secretions in the right mainstem bronchus. Osseous: Degenerative changes affect the spine. No acute fracture identified. Abdomen and Pelvis Hepatobiliary: No focal liver abnormality is seen. No gallstones, gallbladder wall thickening, or biliary dilatation. Pancreas: Unremarkable. No pancreatic ductal dilatation or surrounding inflammatory changes. Spleen: Normal in size without focal abnormality. Adrenals/Urinary Tract:  Bilateral adrenal nodules are stable and were previously characterized as adenomas. There is no hydronephrosis or perinephric fluid collection. There is scarring in the right kidney. The bladder is under distended. Stomach/Bowel: Stomach is within normal limits. Appendix appears normal. No evidence of bowel wall thickening, distention, or inflammatory changes. Small bowel anastomosis is seen in the left upper quadrant. Lymphatic: No enlarged lymph nodes are identified. Reproductive: Prostate gland is within normal limits. Other: No ascites or focal abdominal wall hernia. Musculoskeletal: No acute fracture or dislocation. Tiny sclerotic densities are seen in the proximal left tibia and distal left femur which may be related to bone islands. There are degenerative changes of the lumbar spine. IMPRESSION: 1. No evidence for aortic dissection or aneurysm. 2. 3.4 cm infrarenal abdominal aortic aneurysm. Recommend follow-up ultrasound every 3 years. 3. 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 4. Three-vessel runoff to the bilateral distal tibia/fibula. Patency not confirmed at the level of the ankles which  may be related to timing of the contrast bolus. Aortic Atherosclerosis (ICD10-I70.0) and Emphysema (ICD10-J43.9). NON-VASCULAR 1. Cardiomegaly. 2. No acute localizing process in the chest, abdomen or pelvis. 3. Stable bilateral adrenal adenomas. Electronically Signed   By: Darliss Cheney M.D.   On: 01/22/2024 18:59   CT ABDOMEN PELVIS WO CONTRAST Result Date: 01/22/2024 CLINICAL DATA:  Right-sided abdominal pain. EXAM: CT ABDOMEN AND PELVIS WITHOUT CONTRAST TECHNIQUE: Multidetector CT imaging of the abdomen and pelvis was performed following the standard protocol without IV contrast. RADIATION DOSE REDUCTION: This exam was performed according to the departmental dose-optimization program which includes automated exposure control, adjustment of the mA and/or kV according to  patient size and/or use of iterative reconstruction technique. COMPARISON:  May 12, 2018 FINDINGS: Lower chest: No acute abnormality. Hepatobiliary: No focal liver abnormality is seen. No gallstones, gallbladder wall thickening, or biliary dilatation. Pancreas: Unremarkable. No pancreatic ductal dilatation or surrounding inflammatory changes. Spleen: Normal in size without focal abnormality. Adrenals/Urinary Tract: There is predominantly stable diffuse bilateral adrenal gland enlargement. Kidneys are normal in size, without renal calculi or hydronephrosis. Focal scarring is seen along the posterolateral aspect of the mid right kidney. Bladder is unremarkable. Stomach/Bowel: Stomach is within normal limits. Appendix appears normal. Surgically anastomosed bowel is seen within the mid left abdomen. No evidence of bowel wall thickening, distention, or inflammatory changes. Vascular/Lymphatic: Aortic atherosclerosis with stable 3.5 cm diameter aneurysmal dilatation of the infrarenal abdominal aorta. No enlarged abdominal or pelvic lymph nodes. Reproductive: The prostate gland is mildly enlarged. Other: A 2.5 cm x 9.1 cm x 4.5 cm fat containing ventral hernia is noted along the midline of the mid abdomen. No abdominopelvic ascites. Musculoskeletal: Marked severity multilevel degenerative changes seen throughout the lumbar spine. IMPRESSION: 1. No acute or active process within the abdomen or pelvis. 2. Stable 3.5 cm diameter aneurysmal dilatation of the infrarenal abdominal aorta. Recommend follow-up ultrasound every 3 years. (Ref.: J Vasc Surg. 2018; 67:2-77 and J Am Coll Radiol 2013;10(10):789-794.) 3. Fat-containing ventral hernia. 4. 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 5. Aortic atherosclerosis. Aortic Atherosclerosis (ICD10-I70.0). Electronically Signed   By: Aram Candela M.D.   On: 01/22/2024 11:18   DG Chest Port 1 View Result Date: 01/22/2024 CLINICAL DATA:  Chest  pain EXAM: PORTABLE CHEST 1 VIEW COMPARISON:  08/07/2023 FINDINGS: Left AICD remains in place, unchanged. Heart and mediastinal contours are within normal limits. No focal opacities or effusions. No acute bony abnormality. IMPRESSION: No active disease. Electronically Signed   By: Charlett Nose M.D.   On: 01/22/2024 10:36     Assessment and Plan:   1. Atypical Chest Pain - His current episode of chest pain overall seems atypical for a cardiac etiology as it has been constant since onset which was 4 days ago and is along his right side. The pain radiates all the way from his right shoulder to his right groin area and is not associated with exertion, food consumption or positional changes. He is tender to palpation along his RUQ and RLQ.  - Hs Troponin values have been flat at 29, 25 and 20 which is not consistent with ACS. EKG without acute ST changes. Abdominal CT on admission showed no acute abnormalities. Was noted to have an infrarenal abdominal aorta and focal occlusion along the right tibial peroneal trunk with outpatient vascular follow-up recommended. An echocardiogram has already been ordered by the admitting team. Cardiology can follow-up on results but overall, would not anticipate further ischemic evaluation this  admission given his atypical chest pain.  2.  CAD/HLD - He has a history of multiple interventions as discussed above and most recent cardiac catheterization in 2023 showed patent stents and 50% PLV and 50% PDA stenosis with medical management recommended. - He is not on ASA given the need for anticoagulation. Has been continued on Zetia as he previously had rhabdomyolysis with statins. By review of notes, he was also on Repatha in the past and would recommend restarting this as an outpatient given his complex CAD and PAD.  3.  Chronic HFrEF - His EF was at 20-25% by TEE in 07/2023. He does have an ICD in place and most recent device interrogation in 10/2023 showed normal device  function. He appears euvolemic by examination today. -Continue current GDMT with Entresto 24-26 mg twice daily, Spironolactone 25 mg daily, Jardiance 10 mg daily and Digoxin 0.125 mg daily. Beta-blocker therapy previously discontinued given borderline low output. BP does not allow for further titration of GDMT and anticipate restarting Torsemide at discharge. He was listed as taking 40 mg daily at home and would resume at 20 mg daily as previously recommended by Advanced Heart Failure and also due to his elevated creatinine and low BP on admission which is concerning for dehydration.   4. History of LV Thrombus - Remains on Eliquis for anticoagulation.   5. NSVT - He has been continued on Amiodarone 200mg  daily. AST at 52 and ALT 38 this admission. TSH at 2.347 in 05/2023.  6. History of MR - Previously severe by prior echocardiogram imaging and was referred for mTEER but at the time of the procedure in 07/2023, his MR was mild and the procedure was canceled. Repeat echo pending.   7. PAD - CT on admission showed a stable 3.5 cm aneurysmal dilatation of the infrarenal abdominal aorta with follow-up ultrasound every 3 years. Was noted to have focal occlusion in the right tibial peroneal trunk and proximal right peroneal artery and Vascular Surgery recommended outpatient follow-up.    For questions or updates, please contact [facility] Please consult [email or web address] for contact info under    Signed, Ellsworth Lennox, PA-C  01/23/2024 10:29 AM

## 2024-01-23 NOTE — Plan of Care (Signed)
  Problem: Education: Goal: Understanding of cardiac disease, CV risk reduction, and recovery process will improve Outcome: Adequate for Discharge Goal: Individualized Educational Video(s) Outcome: Adequate for Discharge   Problem: Education: Goal: Individualized Educational Video(s) Outcome: Adequate for Discharge   Problem: Activity: Goal: Ability to tolerate increased activity will improve Outcome: Adequate for Discharge

## 2024-01-23 NOTE — Progress Notes (Signed)
*  PRELIMINARY RESULTS* Echocardiogram 2D Echocardiogram has been performed with Definity.  Stacey Drain 01/23/2024, 4:30 PM

## 2024-01-23 NOTE — Discharge Summary (Signed)
 Physician Discharge Summary  Takumi Din Moxey ZOX:[id number] DOB: 1956-03-02 DOA: 01/22/2024  PCP: Ignatius Specking, MD  Admit date: 01/22/2024 Discharge date: 01/23/2024  Admitted From:  Home  Disposition: Home   Recommendations for Outpatient Follow-up:  Follow up with PCP in 1 weeks Follow up with advanced heart clinic in 2 weeks   Discharge Condition: STABLE   CODE STATUS: FULL DIET: 2 gram sodium restricted heart healthy recommended   Brief Hospitalization Summary: Please see all hospital notes, images, labs for full details of the hospitalization. Admission provider HPI:  68 y.o. 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  3 of alcohol abuse, tobacco abuse and cocaine abuse, he currently stopped all of those. -Patient presents to ED secondary to complaints of chest pain, abdominal pain, cough, congestion, rhinorrhea and nausea, patient reports symptom has been going on for last 4 days, dyspnea, dizziness, lightheadedness, or focal deficits or syncope, forts no provoking factors, usually happen at rest, but he is compliant with his medication, he confirms he does not drink or smoke or do drugs anymore. -In ED his workup significant for elevated creatinine at 1.6, appears to be gradually trending up, troponins 29, repeat 25, less than his baseline, BNP normal limit at 86, CTA chest abdomen pelvis has been obtained, no evidence of dissection or PE, but difficult for imaging finding 3.4 cm infrarenal abdominal aortic aneurysm.Focal occlusion in the right tibial peroneal trunk and proximal right peroneal artery.  Vascular surgery recommended routine outpatient follow-up regarding these findings, ED discussed with cardiology at [facility] who recommended admission to [facility] for observation overnight.   Hospital Course Patient was admitted to the hospital for observation for  atypical chest pain symptoms given his extensive cardiac history.  He was seen by the inpatient cardiology service.  He has ruled out for ACS by serial high-sensitivity troponin tests, reassuring EKG and telemetry monitoring.  Cardiology consulted and felt that his chest pain was noncardiac and his 2D echocardiogram was repeated and it showed no significant changes from prior studies.  All of his cardiac medications have been resumed and cardiology recommended no further inpatient workup indicated at this time.  They recommended that he follow-up with the heart failure clinic in 2 weeks.  He also had CT testing of the abdomen pelvis as well as CT angio aortobifem and chest with no significant acute findings other than findings of right tibial peroneal trunk and proximal right peroneal artery stenosis/occlusion and the recommendation by Dr. Myra Gianotti was for outpatient follow-up with vascular surgery and referral has been placed.  Patient is stable to discharge back to ALF in stable condition.  Discharge Diagnoses:  Active Problems:   Diabetes mellitus type 2 with complications (HCC)   Benign essential HTN   Chronic combined systolic (congestive) and diastolic (congestive) heart failure (HCC)   ICD (implantable cardioverter-defibrillator) in place   Chest pain   Acute on chronic renal failure (HCC)   CAD in native artery   Discharge Instructions: Discharge Instructions     Ambulatory referral to Vascular Surgery   Complete by: As directed       Allergies as of 01/23/2024       Reactions   Contrast Media [iodinated Contrast Media] Other (See Comments)   Does not eat for religious reasons - okay with using IV heparin   Pork-derived Products         Medication  List     TAKE these medications    acetaminophen 325 MG tablet Commonly known as: TYLENOL Take 650 mg by mouth every 6 (six) hours as needed for mild pain.   albuterol 108 (90 Base) MCG/ACT inhaler Commonly known as: VENTOLIN  HFA Inhale 2 puffs into the lungs every 4 (four) hours as needed for wheezing or shortness of breath.   amiodarone 200 MG tablet Commonly known as: PACERONE Take 1 tablet (200 mg total) by mouth daily.   digoxin 0.125 MG tablet Commonly known as: LANOXIN TAKE (1) TABLET BY MOUTH ONCE DAILY.   Eliquis 5 MG Tabs tablet Generic drug: apixaban Take 5 mg by mouth 2 (two) times daily.   empagliflozin 10 MG Tabs tablet Commonly known as: JARDIANCE Take 10 mg by mouth daily.   Entresto 24-26 MG Generic drug: sacubitril-valsartan TAKE (1) TABLET BY MOUTH TWICE DAILY.   ezetimibe 10 MG tablet Commonly known as: Zetia Take 1 tablet (10 mg total) by mouth daily.   gabapentin 100 MG capsule Commonly known as: NEURONTIN Take 100 mg by mouth 2 (two) times daily.   lidocaine 5 % Commonly known as: LIDODERM Place 1 patch onto the skin every 12 (twelve) hours. Leave off 12 hours   metFORMIN 500 MG tablet Commonly known as: GLUCOPHAGE Take 500 mg by mouth daily with breakfast.   nitroGLYCERIN 0.4 MG SL tablet Commonly known as: NITROSTAT PLACE ONE (1) TABLET UNDER TONGUE EVERY 5 MINUTES UP TO (3) DOSES AS NEEDED FOR CHEST PAIN. IF NO RELIEF, CONTACT MD.   pantoprazole 40 MG tablet Commonly known as: PROTONIX Take 40 mg by mouth daily.   potassium chloride 10 MEQ tablet Commonly known as: KLOR-CON Take 2 tablets (20 mEq total) by mouth daily.   spironolactone 25 MG tablet Commonly known as: ALDACTONE Take 1 tablet (25 mg total) by mouth daily.   torsemide 20 MG tablet Commonly known as: DEMADEX Take 1 tablet (20 mg total) by mouth daily. Start taking on: January 24, 2024 What changed: how much to take        Follow-up Information     Vyas, Dhruv B, MD Follow up in 1 week(s).   Specialty: Internal Medicine Why: Hospital Follow Up Contact information: [address] [place] [address] [phone]         [facility].  Schedule an appointment as soon as possible for a visit in 2 week(s).   Specialty: Cardiology Why: Hospital Follow Up Contact information: [address] [place] [address] [phone]               Allergies  Allergen Reactions   Contrast Media [Iodinated Contrast Media] Other (See Comments)    Does not eat for religious reasons - okay with using IV heparin   Pork-Derived Products    Allergies as of 01/23/2024       Reactions   Contrast Media [iodinated Contrast Media] Other (See Comments)   Does not eat for religious reasons - okay with using IV heparin   Pork-derived Products         Medication List     TAKE these medications    acetaminophen 325 MG tablet Commonly known as: TYLENOL Take 650 mg by mouth every 6 (six) hours as needed for mild pain.   albuterol 108 (90 Base) MCG/ACT inhaler Commonly known as: VENTOLIN HFA Inhale 2 puffs into the lungs every 4 (four) hours as needed for wheezing or shortness  of breath.   amiodarone 200 MG tablet Commonly known as: PACERONE Take 1 tablet (200 mg total) by mouth daily.   digoxin 0.125 MG tablet Commonly known as: LANOXIN TAKE (1) TABLET BY MOUTH ONCE DAILY.   Eliquis 5 MG Tabs tablet Generic drug: apixaban Take 5 mg by mouth 2 (two) times daily.   empagliflozin 10 MG Tabs tablet Commonly known as: JARDIANCE Take 10 mg by mouth daily.   Entresto 24-26 MG Generic drug: sacubitril-valsartan TAKE (1) TABLET BY MOUTH TWICE DAILY.   ezetimibe 10 MG tablet Commonly known as: Zetia Take 1 tablet (10 mg total) by mouth daily.   gabapentin 100 MG capsule Commonly known as: NEURONTIN Take 100 mg by mouth 2 (two) times daily.   lidocaine 5 % Commonly known as: LIDODERM Place 1 patch onto the skin every 12 (twelve) hours. Leave off 12 hours   metFORMIN 500 MG tablet Commonly known as: GLUCOPHAGE Take 500 mg by mouth daily with breakfast.   nitroGLYCERIN 0.4 MG SL tablet Commonly  known as: NITROSTAT PLACE ONE (1) TABLET UNDER TONGUE EVERY 5 MINUTES UP TO (3) DOSES AS NEEDED FOR CHEST PAIN. IF NO RELIEF, CONTACT MD.   pantoprazole 40 MG tablet Commonly known as: PROTONIX Take 40 mg by mouth daily.   potassium chloride 10 MEQ tablet Commonly known as: KLOR-CON Take 2 tablets (20 mEq total) by mouth daily.   spironolactone 25 MG tablet Commonly known as: ALDACTONE Take 1 tablet (25 mg total) by mouth daily.   torsemide 20 MG tablet Commonly known as: DEMADEX Take 1 tablet (20 mg total) by mouth daily. Start taking on: January 24, 2024 What changed: how much to take        Procedures/Studies: CT ANGIO CHEST AORTA W/CM &/OR WO/CM Result Date: 01/22/2024 CLINICAL DATA:  Acute aortic syndrome suspected EXAM: CT ANGIOGRAPHY OF ABDOMINAL AORTA WITH ILIOFEMORAL RUNOFF TECHNIQUE: Multidetector CT imaging of the chest, abdomen, pelvis and lower extremities was performed using the standard protocol during bolus administration of intravenous contrast. Multiplanar CT image reconstructions and MIPs were obtained to evaluate the vascular anatomy. RADIATION DOSE REDUCTION: This exam was performed according to the departmental dose-optimization program which includes automated exposure control, adjustment of the mA and/or kV according to patient size and/or use of iterative reconstruction technique. CONTRAST:  80mL OMNIPAQUE IOHEXOL 350 MG/ML SOLN COMPARISON:  CT chest 06/19/2023. CTA chest abdomen and pelvis 06/07/2017. FINDINGS: VASCULAR Thoracic aorta: There is adequate opacification of the thoracic aorta. There is no evidence for dissection or aneurysm. Origin of the great vessels appears within normal limits. Heart: The heart is moderately enlarged. There is no pericardial effusion. Pacemaker is present. Abdominal aorta: Calcified atherosclerotic disease is present. There is an infrarenal abdominal aortic aneurysm measuring 3.4 by 3.1 cm containing a small amount of noncalcified  plaque. There is no significant stenosis or dissection. Celiac: Patent without evidence of aneurysm, dissection, vasculitis or significant stenosis. SMA: Patent without evidence of aneurysm, dissection, vasculitis or significant stenosis. Renals: Both renal arteries are patent without evidence of aneurysm, dissection, vasculitis, fibromuscular dysplasia or significant stenosis. IMA: Patent without evidence of aneurysm, dissection, vasculitis or significant stenosis. RIGHT Lower Extremity Inflow: Common, internal and external iliac arteries are patent without evidence of aneurysm, dissection, vasculitis or significant stenosis. Outflow: Common, superficial and profunda femoral arteries and the popliteal artery are patent without evidence of aneurysm, dissection, vasculitis or significant stenosis. Runoff: Focal occlusion is seen in the tibial peroneal trunk distally and in the proximal peroneal artery.  Otherwise, there is patency of 3 vessels to the distal tibia/fibula. Vessel patency is not seen at the level of the ankle which may be related to timing of the contrast bolus. LEFT Lower Extremity Inflow: Common, internal and external iliac arteries are patent without evidence of aneurysm, dissection, vasculitis or significant stenosis. Outflow: Common, superficial and profunda femoral arteries and the popliteal artery are patent without evidence of aneurysm, dissection, vasculitis or significant stenosis. Calcified atherosclerotic disease is present. Runoff: Patent three vessel runoff to the distal tibia/fibula. Patency not confirmed at the level of the ankle which may be related to timing of the contrast bolus. Veins: No obvious venous abnormality within the limitations of this arterial phase study. Review of the MIP images confirms the above findings. NON-VASCULAR Chest Mediastinum: Visualized thyroid gland is within normal limits. There are no enlarged mediastinal, hilar or axillary lymph nodes. Esophagus is within  normal limits. Lungs: Mild emphysematous changes are present. There is minimal atelectatic change in the bilateral lower lobes. There is no pleural effusion or pneumothorax. There some secretions in the right mainstem bronchus. Osseous: Degenerative changes affect the spine. No acute fracture identified. Abdomen and Pelvis Hepatobiliary: No focal liver abnormality is seen. No gallstones, gallbladder wall thickening, or biliary dilatation. Pancreas: Unremarkable. No pancreatic ductal dilatation or surrounding inflammatory changes. Spleen: Normal in size without focal abnormality. Adrenals/Urinary Tract: Bilateral adrenal nodules are stable and were previously characterized as adenomas. There is no hydronephrosis or perinephric fluid collection. There is scarring in the right kidney. The bladder is under distended. Stomach/Bowel: Stomach is within normal limits. Appendix appears normal. No evidence of bowel wall thickening, distention, or inflammatory changes. Small bowel anastomosis is seen in the left upper quadrant. Lymphatic: No enlarged lymph nodes are identified. Reproductive: Prostate gland is within normal limits. Other: No ascites or focal abdominal wall hernia. Musculoskeletal: No acute fracture or dislocation. Tiny sclerotic densities are seen in the proximal left tibia and distal left femur which may be related to bone islands. There are degenerative changes of the lumbar spine. IMPRESSION: 1. No evidence for aortic dissection or aneurysm. 2. 3.4 cm infrarenal abdominal aortic aneurysm. Recommend follow-up ultrasound every 3 years. 3. 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 4. Three-vessel runoff to the bilateral distal tibia/fibula. Patency not confirmed at the level of the ankles which may be related to timing of the contrast bolus. Aortic Atherosclerosis (ICD10-I70.0) and Emphysema (ICD10-J43.9). NON-VASCULAR 1. Cardiomegaly. 2. No acute localizing process  in the chest, abdomen or pelvis. 3. Stable bilateral adrenal adenomas. Electronically Signed   By: Darliss Cheney M.D.   On: 01/22/2024 18:59   CT ANGIO AO+BIFEM W & OR WO CONTRAST Result Date: 01/22/2024 CLINICAL DATA:  Acute aortic syndrome suspected EXAM: CT ANGIOGRAPHY OF ABDOMINAL AORTA WITH ILIOFEMORAL RUNOFF TECHNIQUE: Multidetector CT imaging of the chest, abdomen, pelvis and lower extremities was performed using the standard protocol during bolus administration of intravenous contrast. Multiplanar CT image reconstructions and MIPs were obtained to evaluate the vascular anatomy. RADIATION DOSE REDUCTION: This exam was performed according to the departmental dose-optimization program which includes automated exposure control, adjustment of the mA and/or kV according to patient size and/or use of iterative reconstruction technique. CONTRAST:  80mL OMNIPAQUE IOHEXOL 350 MG/ML SOLN COMPARISON:  CT chest 06/19/2023. CTA chest abdomen and pelvis 06/07/2017. FINDINGS: VASCULAR Thoracic aorta: There is adequate opacification of the thoracic aorta. There is no evidence for dissection or aneurysm. Origin of the great vessels appears within  normal limits. Heart: The heart is moderately enlarged. There is no pericardial effusion. Pacemaker is present. Abdominal aorta: Calcified atherosclerotic disease is present. There is an infrarenal abdominal aortic aneurysm measuring 3.4 by 3.1 cm containing a small amount of noncalcified plaque. There is no significant stenosis or dissection. Celiac: Patent without evidence of aneurysm, dissection, vasculitis or significant stenosis. SMA: Patent without evidence of aneurysm, dissection, vasculitis or significant stenosis. Renals: Both renal arteries are patent without evidence of aneurysm, dissection, vasculitis, fibromuscular dysplasia or significant stenosis. IMA: Patent without evidence of aneurysm, dissection, vasculitis or significant stenosis. RIGHT Lower Extremity Inflow:  Common, internal and external iliac arteries are patent without evidence of aneurysm, dissection, vasculitis or significant stenosis. Outflow: Common, superficial and profunda femoral arteries and the popliteal artery are patent without evidence of aneurysm, dissection, vasculitis or significant stenosis. Runoff: Focal occlusion is seen in the tibial peroneal trunk distally and in the proximal peroneal artery. Otherwise, there is patency of 3 vessels to the distal tibia/fibula. Vessel patency is not seen at the level of the ankle which may be related to timing of the contrast bolus. LEFT Lower Extremity Inflow: Common, internal and external iliac arteries are patent without evidence of aneurysm, dissection, vasculitis or significant stenosis. Outflow: Common, superficial and profunda femoral arteries and the popliteal artery are patent without evidence of aneurysm, dissection, vasculitis or significant stenosis. Calcified atherosclerotic disease is present. Runoff: Patent three vessel runoff to the distal tibia/fibula. Patency not confirmed at the level of the ankle which may be related to timing of the contrast bolus. Veins: No obvious venous abnormality within the limitations of this arterial phase study. Review of the MIP images confirms the above findings. NON-VASCULAR Chest Mediastinum: Visualized thyroid gland is within normal limits. There are no enlarged mediastinal, hilar or axillary lymph nodes. Esophagus is within normal limits. Lungs: Mild emphysematous changes are present. There is minimal atelectatic change in the bilateral lower lobes. There is no pleural effusion or pneumothorax. There some secretions in the right mainstem bronchus. Osseous: Degenerative changes affect the spine. No acute fracture identified. Abdomen and Pelvis Hepatobiliary: No focal liver abnormality is seen. No gallstones, gallbladder wall thickening, or biliary dilatation. Pancreas: Unremarkable. No pancreatic ductal dilatation  or surrounding inflammatory changes. Spleen: Normal in size without focal abnormality. Adrenals/Urinary Tract: Bilateral adrenal nodules are stable and were previously characterized as adenomas. There is no hydronephrosis or perinephric fluid collection. There is scarring in the right kidney. The bladder is under distended. Stomach/Bowel: Stomach is within normal limits. Appendix appears normal. No evidence of bowel wall thickening, distention, or inflammatory changes. Small bowel anastomosis is seen in the left upper quadrant. Lymphatic: No enlarged lymph nodes are identified. Reproductive: Prostate gland is within normal limits. Other: No ascites or focal abdominal wall hernia. Musculoskeletal: No acute fracture or dislocation. Tiny sclerotic densities are seen in the proximal left tibia and distal left femur which may be related to bone islands. There are degenerative changes of the lumbar spine. IMPRESSION: 1. No evidence for aortic dissection or aneurysm. 2. 3.4 cm infrarenal abdominal aortic aneurysm. Recommend follow-up ultrasound every 3 years. 3. 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 4. Three-vessel runoff to the bilateral distal tibia/fibula. Patency not confirmed at the level of the ankles which may be related to timing of the contrast bolus. Aortic Atherosclerosis (ICD10-I70.0) and Emphysema (ICD10-J43.9). NON-VASCULAR 1. Cardiomegaly. 2. No acute localizing process in the chest, abdomen or pelvis. 3. Stable bilateral adrenal adenomas. Electronically  Signed   By: Darliss Cheney M.D.   On: 01/22/2024 18:59   CT ABDOMEN PELVIS WO CONTRAST Result Date: 01/22/2024 CLINICAL DATA:  Right-sided abdominal pain. EXAM: CT ABDOMEN AND PELVIS WITHOUT CONTRAST TECHNIQUE: Multidetector CT imaging of the abdomen and pelvis was performed following the standard protocol without IV contrast. RADIATION DOSE REDUCTION: This exam was performed according to the departmental  dose-optimization program which includes automated exposure control, adjustment of the mA and/or kV according to patient size and/or use of iterative reconstruction technique. COMPARISON:  May 12, 2018 FINDINGS: Lower chest: No acute abnormality. Hepatobiliary: No focal liver abnormality is seen. No gallstones, gallbladder wall thickening, or biliary dilatation. Pancreas: Unremarkable. No pancreatic ductal dilatation or surrounding inflammatory changes. Spleen: Normal in size without focal abnormality. Adrenals/Urinary Tract: There is predominantly stable diffuse bilateral adrenal gland enlargement. Kidneys are normal in size, without renal calculi or hydronephrosis. Focal scarring is seen along the posterolateral aspect of the mid right kidney. Bladder is unremarkable. Stomach/Bowel: Stomach is within normal limits. Appendix appears normal. Surgically anastomosed bowel is seen within the mid left abdomen. No evidence of bowel wall thickening, distention, or inflammatory changes. Vascular/Lymphatic: Aortic atherosclerosis with stable 3.5 cm diameter aneurysmal dilatation of the infrarenal abdominal aorta. No enlarged abdominal or pelvic lymph nodes. Reproductive: The prostate gland is mildly enlarged. Other: A 2.5 cm x 9.1 cm x 4.5 cm fat containing ventral hernia is noted along the midline of the mid abdomen. No abdominopelvic ascites. Musculoskeletal: Marked severity multilevel degenerative changes seen throughout the lumbar spine. IMPRESSION: 1. No acute or active process within the abdomen or pelvis. 2. Stable 3.5 cm diameter aneurysmal dilatation of the infrarenal abdominal aorta. Recommend follow-up ultrasound every 3 years. (Ref.: J Vasc Surg. 2018; 67:2-77 and J Am Coll Radiol 2013;10(10):789-794.) 3. Fat-containing ventral hernia. 4. 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 5. Aortic atherosclerosis. Aortic Atherosclerosis (ICD10-I70.0). Electronically Signed   By:  Aram Candela M.D.   On: 01/22/2024 11:18   DG Chest Port 1 View Result Date: 01/22/2024 CLINICAL DATA:  Chest pain EXAM: PORTABLE CHEST 1 VIEW COMPARISON:  08/07/2023 FINDINGS: Left AICD remains in place, unchanged. Heart and mediastinal contours are within normal limits. No focal opacities or effusions. No acute bony abnormality. IMPRESSION: No active disease. Electronically Signed   By: Charlett Nose M.D.   On: 01/22/2024 10:36     Subjective: Pt reports that he is feeling better not having the same level of pain he had before.  He denies SOB and palpitations, his symptoms are more in the right lower quadrant and down right leg.    Discharge Exam: Vitals:   01/23/24 1355 01/23/24 1500  BP: 109/76   Pulse: 71   Resp: 18   Temp: 98 F (36.7 C)   SpO2: 100% 100%   Vitals:   01/23/24 0156 01/23/24 0346 01/23/24 1355 01/23/24 1500  BP: (!) 83/60 109/89 109/76   Pulse: 64 60 71   Resp: 20 20 18    Temp: 97.8 F (36.6 C) 97.8 F (36.6 C) 98 F (36.7 C)   TempSrc: Oral Oral    SpO2: 95% 97% 100% 100%  Weight:      Height:       General: Pt is alert, awake, not in acute distress Cardiovascular: normal S1/S2 +, no rubs, no gallops Respiratory: CTA bilaterally, no wheezing, no rhonchi Abdominal: Soft, NT, ND, bowel sounds + Extremities: no edema, no cyanosis   The results of significant diagnostics from this  hospitalization (including imaging, microbiology, ancillary and laboratory) are listed below for reference.     Microbiology: Recent Results (from the past 240 hours)  Resp panel by RT-PCR (RSV, Flu A&B, Covid) Anterior Nasal Swab     Status: None   Collection Time: 01/22/24 10:18 AM   Specimen: Anterior Nasal Swab  Result Value Ref Range Status   SARS Coronavirus 2 by RT PCR NEGATIVE NEGATIVE Final    Comment: (NOTE) SARS-CoV-2 target nucleic acids are NOT DETECTED.  The SARS-CoV-2 RNA is generally detectable in upper respiratory specimens during the acute phase of  infection. The lowest concentration of SARS-CoV-2 viral copies this assay can detect is 138 copies/mL. A negative result does not preclude SARS-Cov-2 infection and should not be used as the sole basis for treatment or other patient management decisions. A negative result may occur with  improper specimen collection/handling, submission of specimen other than nasopharyngeal swab, presence of viral mutation(s) within the areas targeted by this assay, and inadequate number of viral copies(<138 copies/mL). A negative result must be combined with clinical observations, patient history, and epidemiological information. The expected result is Negative.  Fact Sheet for Patients:  BloggerCourse.com  Fact Sheet for Healthcare Providers:  SeriousBroker.it  This test is no t yet approved or cleared by the [place] FDA and  has been authorized for detection and/or diagnosis of SARS-CoV-2 by FDA under an Emergency Use Authorization (EUA). This EUA will remain  in effect (meaning this test can be used) for the duration of the COVID-19 declaration under Section 564(b)(1) of the Act, 21 U.S.C.section 360bbb-3(b)(1), unless the authorization is terminated  or revoked sooner.       Influenza A by PCR NEGATIVE NEGATIVE Final   Influenza B by PCR NEGATIVE NEGATIVE Final    Comment: (NOTE) The Xpert Xpress SARS-CoV-2/FLU/RSV plus assay is intended as an aid in the diagnosis of influenza from Nasopharyngeal swab specimens and should not be used as a sole basis for treatment. Nasal washings and aspirates are unacceptable for Xpert Xpress SARS-CoV-2/FLU/RSV testing.  Fact Sheet for Patients: BloggerCourse.com  Fact Sheet for Healthcare Providers: SeriousBroker.it  This test is not yet approved or cleared by the [place] FDA and has been authorized for detection and/or diagnosis of SARS-CoV-2  by FDA under an Emergency Use Authorization (EUA). This EUA will remain in effect (meaning this test can be used) for the duration of the COVID-19 declaration under Section 564(b)(1) of the Act, 21 U.S.C. section 360bbb-3(b)(1), unless the authorization is terminated or revoked.     Resp Syncytial Virus by PCR NEGATIVE NEGATIVE Final    Comment: (NOTE) Fact Sheet for Patients: BloggerCourse.com  Fact Sheet for Healthcare Providers: SeriousBroker.it  This test is not yet approved or cleared by the [place] FDA and has been authorized for detection and/or diagnosis of SARS-CoV-2 by FDA under an Emergency Use Authorization (EUA). This EUA will remain in effect (meaning this test can be used) for the duration of the COVID-19 declaration under Section 564(b)(1) of the Act, 21 U.S.C. section 360bbb-3(b)(1), unless the authorization is terminated or revoked.  Performed at [facility], [address], [place], [place] [address]      Labs: BNP (last 3 results) Recent Labs    06/16/23 2338 08/16/23 1031 01/22/24 1024  BNP 111.1* 259.0* 86.0   Basic Metabolic Panel: Recent Labs  Lab 01/22/24 1024 01/23/24 0718  NA 139 135  K 3.9 3.9  CL 97* 100  CO2 27 24  GLUCOSE 109* 122*  BUN 21 17  CREATININE 1.60* 1.08  CALCIUM 10.1 9.0   Liver Function Tests: Recent Labs  Lab 01/22/24 1024  AST 52*  ALT 38  ALKPHOS 77  BILITOT 0.5  PROT 9.5*  ALBUMIN 5.0   No results for input(s): "LIPASE", "AMYLASE" in the last 168 hours. No results for input(s): "AMMONIA" in the last 168 hours. CBC: Recent Labs  Lab 01/22/24 1024 01/23/24 0833  WBC 6.4 10.8*  NEUTROABS 4.7  --   HGB 17.1* 13.5  HCT 54.1* 41.8  MCV 82.2 81.5  PLT 336 268   Cardiac Enzymes: No results for input(s): "CKTOTAL", "CKMB", "CKMBINDEX", "TROPONINI" in the last 168 hours. BNP: Invalid input(s): "POCBNP" CBG: Recent Labs  Lab 01/22/24 2125  01/23/24 0736 01/23/24 1121  GLUCAP 306* 115* 174*   D-Dimer No results for input(s): "DDIMER" in the last 72 hours. Hgb A1c Recent Labs    01/22/24 1024  HGBA1C 6.8*   Lipid Profile No results for input(s): "CHOL", "HDL", "LDLCALC", "TRIG", "CHOLHDL", "LDLDIRECT" in the last 72 hours. Thyroid function studies No results for input(s): "TSH", "T4TOTAL", "T3FREE", "THYROIDAB" in the last 72 hours.  Invalid input(s): "FREET3" Anemia work up No results for input(s): "VITAMINB12", "FOLATE", "FERRITIN", "TIBC", "IRON", "RETICCTPCT" in the last 72 hours. Urinalysis    Component Value Date/Time   COLORURINE COLORLESS (A) 01/22/2024 0945   APPEARANCEUR CLEAR 01/22/2024 0945   LABSPEC 1.005 01/22/2024 0945   PHURINE 6.0 01/22/2024 0945   GLUCOSEU >=500 (A) 01/22/2024 0945   HGBUR NEGATIVE 01/22/2024 0945   BILIRUBINUR NEGATIVE 01/22/2024 0945   KETONESUR NEGATIVE 01/22/2024 0945   PROTEINUR NEGATIVE 01/22/2024 0945   NITRITE NEGATIVE 01/22/2024 0945   LEUKOCYTESUR NEGATIVE 01/22/2024 0945   Sepsis Labs Recent Labs  Lab 01/22/24 1024 01/23/24 0833  WBC 6.4 10.8*   Microbiology Recent Results (from the past 240 hours)  Resp panel by RT-PCR (RSV, Flu A&B, Covid) Anterior Nasal Swab     Status: None   Collection Time: 01/22/24 10:18 AM   Specimen: Anterior Nasal Swab  Result Value Ref Range Status   SARS Coronavirus 2 by RT PCR NEGATIVE NEGATIVE Final    Comment: (NOTE) SARS-CoV-2 target nucleic acids are NOT DETECTED.  The SARS-CoV-2 RNA is generally detectable in upper respiratory specimens during the acute phase of infection. The lowest concentration of SARS-CoV-2 viral copies this assay can detect is 138 copies/mL. A negative result does not preclude SARS-Cov-2 infection and should not be used as the sole basis for treatment or other patient management decisions. A negative result may occur with  improper specimen collection/handling, submission of specimen  other than nasopharyngeal swab, presence of viral mutation(s) within the areas targeted by this assay, and inadequate number of viral copies(<138 copies/mL). A negative result must be combined with clinical observations, patient history, and epidemiological information. The expected result is Negative.  Fact Sheet for Patients:  BloggerCourse.com  Fact Sheet for Healthcare Providers:  SeriousBroker.it  This test is no t yet approved or cleared by the [place] FDA and  has been authorized for detection and/or diagnosis of SARS-CoV-2 by FDA under an Emergency Use Authorization (EUA). This EUA will remain  in effect (meaning this test can be used) for the duration of the COVID-19 declaration under Section 564(b)(1) of the Act, 21 U.S.C.section 360bbb-3(b)(1), unless the authorization is terminated  or revoked sooner.       Influenza A by PCR NEGATIVE NEGATIVE Final   Influenza B by PCR NEGATIVE NEGATIVE Final  Comment: (NOTE) The Xpert Xpress SARS-CoV-2/FLU/RSV plus assay is intended as an aid in the diagnosis of influenza from Nasopharyngeal swab specimens and should not be used as a sole basis for treatment. Nasal washings and aspirates are unacceptable for Xpert Xpress SARS-CoV-2/FLU/RSV testing.  Fact Sheet for Patients: BloggerCourse.com  Fact Sheet for Healthcare Providers: SeriousBroker.it  This test is not yet approved or cleared by the [place] FDA and has been authorized for detection and/or diagnosis of SARS-CoV-2 by FDA under an Emergency Use Authorization (EUA). This EUA will remain in effect (meaning this test can be used) for the duration of the COVID-19 declaration under Section 564(b)(1) of the Act, 21 U.S.C. section 360bbb-3(b)(1), unless the authorization is terminated or revoked.     Resp Syncytial Virus by PCR NEGATIVE NEGATIVE Final     Comment: (NOTE) Fact Sheet for Patients: BloggerCourse.com  Fact Sheet for Healthcare Providers: SeriousBroker.it  This test is not yet approved or cleared by the [place] FDA and has been authorized for detection and/or diagnosis of SARS-CoV-2 by FDA under an Emergency Use Authorization (EUA). This EUA will remain in effect (meaning this test can be used) for the duration of the COVID-19 declaration under Section 564(b)(1) of the Act, 21 U.S.C. section 360bbb-3(b)(1), unless the authorization is terminated or revoked.  Performed at [facility], [address] [place], [place] [address]     Time coordinating discharge: 34 mins  SIGNED:  Standley Dakins, MD  [facility] 01/23/2024, 3:50 PM How to contact the [facility] Attending or Consulting provider 7A - 7P or covering provider during after hours 7P -7A, for this patient?  Check the care team in [facility] and look for a) attending/consulting [facility] provider listed and b) the [facility] team listed Log into [email or web address] and use 's universal password to access. If you do not have the password, please contact the hospital operator. Locate the [facility] provider you are looking for under [facility] and page to a number that you can be directly reached. If you still have difficulty reaching the provider, please page the [facility] (Director on Call) for the Hospitalists listed on amion for assistance.

## 2024-01-23 NOTE — [place] FL2 (Signed)
 [place] MEDICAID FL2 LEVEL OF CARE FORM     IDENTIFICATION  Patient Name: Daniel Li Birthdate: 01/21/1956 Sex: male Admission Date (Current Location): 01/22/2024  [facility] and [place] Number:  [employer] and Address:  [facility],  [address], [place] [address]      Provider Number: [id number]  Attending Physician Name and Address:  Cleora Fleet, MD  Relative Name and Phone Number:  Luanne Bras [facility]) [phone]    Current Level of Care: Hospital Recommended Level of Care: Assisted Living Facility Prior Approval Number:    Date Approved/Denied:   PASRR Number:    Discharge Plan: Other (Comment) (Assisted Living)    Current Diagnoses: Patient Active Problem List   Diagnosis Date Noted   Moderate mitral insufficiency 08/07/2023   Dizziness 12/09/2022   Cocaine positive 12/09/2022   Mitral regurgitation 03/22/2022   CAD in native artery 08/23/2020   Unstable angina (HCC) 08/22/2020   Status post coronary artery stent placement    Hyponatremia    Gastroesophageal reflux disease    Dehydration with hyponatremia    Acute on chronic renal failure (HCC) 05/25/2018   Abdominal pain, acute, epigastric 05/17/2018   Hernia of abdominal wall 05/17/2018   Presumed Gastritis 05/17/2018   AKI (acute kidney injury) (HCC) 05/16/2018   Generalized abdominal pain    Elevated troponin    Chest pain 04/29/2018   ICD (implantable cardioverter-defibrillator) in place 03/05/2018   Anticoagulated on Coumadin 03/05/2018   Non-STEMI (non-ST elevated myocardial infarction) (HCC) 02/12/2018   Precordial chest pain 02/11/2018   Chronic systolic CHF (congestive heart failure) (HCC) 02/11/2018   Chronic combined systolic (congestive) and diastolic (congestive) heart failure (HCC)    STEMI (ST elevation myocardial infarction) (HCC) 02/21/2017   Ischemic cardiomyopathy 07/20/2014   High risk medication use 01/28/2013   Special screening for  malignant neoplasms, colon 01/28/2013   Hypercholesterolemia    Tobacco abuse    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   Benign essential HTN 03/26/2012   CAD S/P percutaneous coronary angioplasty 03/26/2012    Orientation RESPIRATION BLADDER Height & Weight     Self, Time, Situation, Place  Normal Continent Weight: 144 lb 10 oz (65.6 kg) Height:  5\' 6"  (167.6 cm)  BEHAVIORAL SYMPTOMS/MOOD NEUROLOGICAL BOWEL NUTRITION STATUS      Continent Diet (See DC summary)  AMBULATORY STATUS COMMUNICATION OF NEEDS Skin   Independent Verbally Normal                       Personal Care Assistance Level of Assistance  Bathing, Feeding, Dressing Bathing Assistance: Independent Feeding assistance: Independent Dressing Assistance: Independent     Functional Limitations Info  Sight, Hearing, Speech Sight Info: Adequate Hearing Info: Adequate Speech Info: Adequate    SPECIAL CARE FACTORS FREQUENCY                       Contractures Contractures Info: Not present    Additional Factors Info  Code Status, Allergies Code Status Info: FULL Allergies Info: Contrast media (iodinated contrast media) and pork-derived products           Current Medications (01/23/2024):  This is the current hospital active medication list Current Facility-Administered Medications  Medication Dose Route Frequency Provider Last Rate Last Admin   acetaminophen (TYLENOL) tablet 650 mg  650 mg Oral Q4H PRN Elgergawy, Leana Roe, MD   650 mg at 01/23/24 1150  amiodarone (PACERONE) tablet 200 mg  200 mg Oral Daily Elgergawy, Leana Roe, MD   200 mg at 01/23/24 0900   apixaban (ELIQUIS) tablet 5 mg  5 mg Oral BID Elgergawy, Leana Roe, MD   5 mg at 01/23/24 0900   digoxin (LANOXIN) tablet 0.125 mg  0.125 mg Oral Daily Elgergawy, Leana Roe, MD   0.125 mg at 01/23/24 0900   empagliflozin (JARDIANCE) tablet 10 mg  10 mg Oral Daily Elgergawy, Leana Roe, MD   10 mg at 01/23/24 0900   ezetimibe  (ZETIA) tablet 10 mg  10 mg Oral Daily Elgergawy, Leana Roe, MD   10 mg at 01/23/24 0900   gabapentin (NEURONTIN) capsule 100 mg  100 mg Oral BID Elgergawy, Leana Roe, MD   100 mg at 01/23/24 0900   insulin aspart (novoLOG) injection 0-5 Units  0-5 Units Subcutaneous QHS Elgergawy, Leana Roe, MD   4 Units at 01/22/24 2143   insulin aspart (novoLOG) injection 0-9 Units  0-9 Units Subcutaneous TID WC Elgergawy, Leana Roe, MD   2 Units at 01/23/24 1151   nitroGLYCERIN (NITROSTAT) SL tablet 0.4 mg  0.4 mg Sublingual Q5 min PRN Elgergawy, Leana Roe, MD       ondansetron (ZOFRAN) injection 4 mg  4 mg Intravenous Q6H PRN Elgergawy, Leana Roe, MD       pantoprazole (PROTONIX) EC tablet 40 mg  40 mg Oral Daily Elgergawy, Leana Roe, MD   40 mg at 01/23/24 0900   sacubitril-valsartan (ENTRESTO) 24-26 mg per tablet  1 tablet Oral BID Elgergawy, Leana Roe, MD   1 tablet at 01/23/24 0900   spironolactone (ALDACTONE) tablet 25 mg  25 mg Oral Daily Elgergawy, Leana Roe, MD   25 mg at 01/23/24 0902     Discharge Medications: Please see discharge summary for a list of discharge medications.  Relevant Imaging Results:  Relevant Lab Results:   Additional Information SSN: [id number] [place]

## 2024-01-23 NOTE — Hospital Course (Signed)
 68 y.o. 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  3 of alcohol abuse, tobacco abuse and cocaine abuse, he currently stopped all of those. -Patient presents to ED secondary to complaints of chest pain, abdominal pain, cough, congestion, rhinorrhea and nausea, patient reports symptom has been going on for last 4 days, dyspnea, dizziness, lightheadedness, or focal deficits or syncope, forts no provoking factors, usually happen at rest, but he is compliant with his medication, he confirms he does not drink or smoke or do drugs anymore. -In ED his workup significant for elevated creatinine at 1.6, appears to be gradually trending up, troponins 29, repeat 25, less than his baseline, BNP normal limit at 86, CTA chest abdomen pelvis has been obtained, no evidence of dissection or PE, but difficult for imaging finding 3.4 cm infrarenal abdominal aortic aneurysm.Focal occlusion in the right tibial peroneal trunk and proximal right peroneal artery.  Vascular surgery recommended routine outpatient follow-up regarding these findings, ED discussed with cardiology at [facility] who recommended admission to [facility] for observation overnight.

## 2024-01-24 ENCOUNTER — Other Ambulatory Visit (HOSPITAL_COMMUNITY): Payer: Self-pay | Admitting: Cardiology

## 2024-01-24 DIAGNOSIS — I5022 Chronic systolic (congestive) heart failure: Secondary | ICD-10-CM

## 2024-01-31 DIAGNOSIS — I10 Essential (primary) hypertension: Secondary | ICD-10-CM | POA: Diagnosis not present

## 2024-01-31 DIAGNOSIS — I5022 Chronic systolic (congestive) heart failure: Secondary | ICD-10-CM | POA: Diagnosis not present

## 2024-01-31 DIAGNOSIS — Z09 Encounter for follow-up examination after completed treatment for conditions other than malignant neoplasm: Secondary | ICD-10-CM | POA: Diagnosis not present

## 2024-01-31 DIAGNOSIS — I4891 Unspecified atrial fibrillation: Secondary | ICD-10-CM | POA: Diagnosis not present

## 2024-01-31 DIAGNOSIS — I714 Abdominal aortic aneurysm, without rupture, unspecified: Secondary | ICD-10-CM | POA: Diagnosis not present

## 2024-02-03 ENCOUNTER — Encounter

## 2024-02-04 ENCOUNTER — Encounter (HOSPITAL_COMMUNITY): Payer: Self-pay | Admitting: Emergency Medicine

## 2024-02-04 ENCOUNTER — Emergency Department (HOSPITAL_COMMUNITY)
Admission: EM | Admit: 2024-02-04 | Discharge: 2024-02-05 | Disposition: A | Discharge status: Home / Self Care | Attending: Emergency Medicine | Admitting: Emergency Medicine

## 2024-02-04 ENCOUNTER — Other Ambulatory Visit: Payer: Self-pay

## 2024-02-04 ENCOUNTER — Emergency Department (HOSPITAL_COMMUNITY)

## 2024-02-04 DIAGNOSIS — R0789 Other chest pain: Secondary | ICD-10-CM | POA: Diagnosis not present

## 2024-02-04 DIAGNOSIS — J209 Acute bronchitis, unspecified: Secondary | ICD-10-CM | POA: Diagnosis not present

## 2024-02-04 DIAGNOSIS — Z95810 Presence of automatic (implantable) cardiac defibrillator: Secondary | ICD-10-CM | POA: Insufficient documentation

## 2024-02-04 DIAGNOSIS — I2510 Atherosclerotic heart disease of native coronary artery without angina pectoris: Secondary | ICD-10-CM | POA: Insufficient documentation

## 2024-02-04 DIAGNOSIS — I959 Hypotension, unspecified: Secondary | ICD-10-CM | POA: Diagnosis not present

## 2024-02-04 DIAGNOSIS — R079 Chest pain, unspecified: Secondary | ICD-10-CM | POA: Diagnosis not present

## 2024-02-04 DIAGNOSIS — Z7984 Long term (current) use of oral hypoglycemic drugs: Secondary | ICD-10-CM | POA: Insufficient documentation

## 2024-02-04 DIAGNOSIS — R7989 Other specified abnormal findings of blood chemistry: Secondary | ICD-10-CM | POA: Insufficient documentation

## 2024-02-04 DIAGNOSIS — R0602 Shortness of breath: Secondary | ICD-10-CM | POA: Insufficient documentation

## 2024-02-04 DIAGNOSIS — Z950 Presence of cardiac pacemaker: Secondary | ICD-10-CM | POA: Diagnosis not present

## 2024-02-04 DIAGNOSIS — Z7901 Long term (current) use of anticoagulants: Secondary | ICD-10-CM | POA: Insufficient documentation

## 2024-02-04 DIAGNOSIS — R059 Cough, unspecified: Secondary | ICD-10-CM | POA: Diagnosis not present

## 2024-02-04 MED ORDER — ALBUTEROL SULFATE HFA 108 (90 BASE) MCG/ACT IN AERS: 2.0000 | INHALATION_SPRAY | RESPIRATORY_TRACT | Status: DC | PRN

## 2024-02-04 NOTE — ED Triage Notes (Signed)
 Pt BIB [facility] from [facility] after pt c/o shortness of breath, sore throat, sinus congestion, and headache. Pt placed on 2L [place] by EMS for pts comfort.

## 2024-02-05 DIAGNOSIS — R0602 Shortness of breath: Secondary | ICD-10-CM | POA: Diagnosis not present

## 2024-02-05 LAB — CBC WITH DIFFERENTIAL/PLATELET
Abs Immature Granulocytes: 0.03 10*3/uL (ref 0.00–0.07)
Basophils Absolute: 0 10*3/uL (ref 0.0–0.1)
Basophils Relative: 1 %
Eosinophils Absolute: 0.1 10*3/uL (ref 0.0–0.5)
Eosinophils Relative: 1 %
HCT: 38.3 % — ABNORMAL LOW (ref 39.0–52.0)
Hemoglobin: 12.3 g/dL — ABNORMAL LOW (ref 13.0–17.0)
Immature Granulocytes: 1 %
Lymphocytes Relative: 25 %
Lymphs Abs: 1.5 10*3/uL (ref 0.7–4.0)
MCH: 26.2 pg (ref 26.0–34.0)
MCHC: 32.1 g/dL (ref 30.0–36.0)
MCV: 81.7 fL (ref 80.0–100.0)
Monocytes Absolute: 0.4 10*3/uL (ref 0.1–1.0)
Monocytes Relative: 6 %
Neutro Abs: 4 10*3/uL (ref 1.7–7.7)
Neutrophils Relative %: 66 %
Platelets: 261 10*3/uL (ref 150–400)
RBC: 4.69 MIL/uL (ref 4.22–5.81)
RDW: 17.2 % — ABNORMAL HIGH (ref 11.5–15.5)
WBC: 5.9 10*3/uL (ref 4.0–10.5)
nRBC: 0 % (ref 0.0–0.2)

## 2024-02-05 LAB — COMPREHENSIVE METABOLIC PANEL WITH GFR
ALT: 29 U/L (ref 0–44)
AST: 29 U/L (ref 15–41)
Albumin: 3 g/dL — ABNORMAL LOW (ref 3.5–5.0)
Alkaline Phosphatase: 59 U/L (ref 38–126)
Anion gap: 11 (ref 5–15)
BUN: 11 mg/dL (ref 8–23)
CO2: 25 mmol/L (ref 22–32)
Calcium: 8.8 mg/dL — ABNORMAL LOW (ref 8.9–10.3)
Chloride: 103 mmol/L (ref 98–111)
Creatinine, Ser: 1 mg/dL (ref 0.61–1.24)
GFR, Estimated: >60 mL/min (ref >60)
Glucose, Bld: 90 mg/dL (ref 70–99)
Potassium: 3.5 mmol/L (ref 3.5–5.1)
Sodium: 139 mmol/L (ref 135–145)
Total Bilirubin: 0.4 mg/dL (ref 0.0–1.2)
Total Protein: 5.9 g/dL — ABNORMAL LOW (ref 6.5–8.1)

## 2024-02-05 LAB — TROPONIN I (HIGH SENSITIVITY): Troponin I (High Sensitivity): 25 ng/L — ABNORMAL HIGH (ref <18)

## 2024-02-05 LAB — BRAIN NATRIURETIC PEPTIDE: B Natriuretic Peptide: 127 pg/mL — ABNORMAL HIGH (ref 0.0–100.0)

## 2024-02-05 MED ORDER — DOXYCYCLINE HYCLATE 100 MG PO CAPS
100.0000 mg | ORAL_CAPSULE | Freq: Two times a day (BID) | ORAL | 0 refills | Status: DC
Start: 2024-02-05 — End: 2024-02-05

## 2024-02-05 MED ORDER — DOXYCYCLINE HYCLATE 100 MG PO TABS
100.0000 mg | ORAL_TABLET | Freq: Once | ORAL | Status: AC
  Administered 2024-02-05: 100 mg via ORAL
  Filled 2024-02-05: qty 1

## 2024-02-05 MED ORDER — DOXYCYCLINE HYCLATE 100 MG PO CAPS: 100.0000 mg | ORAL_CAPSULE | Freq: Two times a day (BID) | ORAL | 0 refills | Status: DC

## 2024-02-05 MED ORDER — IPRATROPIUM-ALBUTEROL 0.5-2.5 (3) MG/3ML IN SOLN
3.0000 mL | Freq: Once | RESPIRATORY_TRACT | Status: AC
  Administered 2024-02-05: 3 mL via RESPIRATORY_TRACT

## 2024-02-05 NOTE — Discharge Instructions (Signed)
 Begin taking doxycycline as prescribed.  Continue home medications as previously prescribed.  Follow-up with primary doctor if not improving, and return to the ER if symptoms significantly worsen or change.

## 2024-02-05 NOTE — ED Provider Notes (Signed)
 [place] EMERGENCY DEPARTMENT AT [facility] Provider Note   CSN: [id number] Arrival date & time: 02/04/24  2110     History  Chief Complaint  Patient presents with   Shortness of Breath    Daniel Li is a 68 y.o. male.  This patient is a 68 year old male with extensive past medical history including coronary artery disease with stents, ischemic cardiomyopathy with decreased ejection fraction and defibrillator placement.  Patient presenting today for evaluation of congestion and cough for the past 2 days.  Patient denies fevers or chills.  He denies any stabbing chest pain.  He is somewhat of a difficult historian       Home Medications Prior to Admission medications   Medication Sig Start Date End Date Taking? Authorizing Provider  acetaminophen (TYLENOL) 325 MG tablet Take 650 mg by mouth every 6 (six) hours as needed for mild pain.    [provider]  albuterol (VENTOLIN HFA) 108 (90 Base) MCG/ACT inhaler Inhale 2 puffs into the lungs every 4 (four) hours as needed for wheezing or shortness of breath. 06/19/23   Eber Hong, MD  amiodarone (PACERONE) 200 MG tablet Take 1 tablet (200 mg total) by mouth daily. 07/16/23   Andrey Farmer, PA-C  apixaban (ELIQUIS) 5 MG TABS tablet Take 5 mg by mouth 2 (two) times daily.    [provider]  digoxin (LANOXIN) 0.125 MG tablet TAKE (1) TABLET BY MOUTH ONCE DAILY. 09/09/23   Laurey Morale, MD  empagliflozin (JARDIANCE) 10 MG TABS tablet Take 10 mg by mouth daily.    [provider]  ENTRESTO 24-26 MG TAKE (1) TABLET BY MOUTH TWICE DAILY. 01/09/24   Laurey Morale, MD  ezetimibe (ZETIA) 10 MG tablet Take 1 tablet (10 mg total) by mouth daily. 03/27/17   Antoine Poche, MD  gabapentin (NEURONTIN) 100 MG capsule Take 100 mg by mouth 2 (two) times daily.    [provider]  lidocaine (LIDODERM) 5 % Place 1 patch onto the skin every 12 (twelve) hours. Leave off 12 hours  05/27/23   [provider]  metFORMIN (GLUCOPHAGE) 500 MG tablet Take 500 mg by mouth daily with breakfast.    [provider]  nitroGLYCERIN (NITROSTAT) 0.4 MG SL tablet PLACE ONE (1) TABLET UNDER TONGUE EVERY 5 MINUTES UP TO (3) DOSES AS NEEDED FOR CHEST PAIN. IF NO RELIEF, CONTACT MD. 09/29/19   Antoine Poche, MD  pantoprazole (PROTONIX) 40 MG tablet Take 40 mg by mouth daily.    [provider]  potassium chloride (KLOR-CON) 10 MEQ tablet Take 2 tablets (20 mEq total) by mouth daily. 11/06/23 01/23/24  Laurey Morale, MD  spironolactone (ALDACTONE) 25 MG tablet Take 1 tablet (25 mg total) by mouth daily. 06/10/23   Laurey Morale, MD  torsemide (DEMADEX) 20 MG tablet TAKE (1) TABLET BY MOUTH ONCE DAILY. 01/24/24   Laurey Morale, MD      Allergies    Contrast media [iodinated contrast media] and Pork-derived products    Review of Systems   Review of Systems  All other systems reviewed and are negative.   Physical Exam Updated Vital Signs BP 104/86 (BP Location: Right Arm)   Pulse 70   Temp 98.7 F (37.1 C) (Oral)   Resp 20   Ht 5\' 6"  (1.676 m)   Wt 65.6 kg   SpO2 99%   BMI 23.34 kg/m  Physical Exam Vitals and nursing note reviewed.  Constitutional:  General: He is not in acute distress.    Appearance: He is well-developed. He is not diaphoretic.  HENT:     Head: Normocephalic and atraumatic.  Cardiovascular:     Rate and Rhythm: Normal rate and regular rhythm.     Heart sounds: No murmur heard.    No friction rub.  Pulmonary:     Effort: Pulmonary effort is normal. No respiratory distress.     Breath sounds: Normal breath sounds. No wheezing or rales.  Abdominal:     General: Bowel sounds are normal. There is no distension.     Palpations: Abdomen is soft.     Tenderness: There is no abdominal tenderness.  Musculoskeletal:        General: Normal range of motion.     Cervical back: Normal range of motion and neck supple.  Skin:     General: Skin is warm and dry.  Neurological:     Mental Status: He is alert and oriented to person, place, and time.     Coordination: Coordination normal.     ED Results / Procedures / Treatments   Labs (all labs ordered are listed, but only abnormal results are displayed) Labs Reviewed  COMPREHENSIVE METABOLIC PANEL WITH GFR  CBC WITH DIFFERENTIAL/PLATELET  BRAIN NATRIURETIC PEPTIDE  TROPONIN I (HIGH SENSITIVITY)    EKG EKG Interpretation Date/Time:  Tuesday February 04 2024 21:47:17 EDT Ventricular Rate:  63 PR Interval:  218 QRS Duration:  106 QT Interval:  408 QTC Calculation: 417 R Axis:   33  Text Interpretation: Sinus rhythm with 1st degree A-V block Nonspecific ST and T wave abnormality Abnormal ECG No significant change since 01/22/2024 Confirmed by Geoffery Lyons ([id number]) on 02/05/2024 12:24:24 AM  Radiology DG Chest 2 View Result Date: 02/04/2024 CLINICAL DATA:  Cough and shortness of breath. EXAM: CHEST - 2 VIEW COMPARISON:  01/22/2024 FINDINGS: Single lead left-sided pacemaker. Stable heart size and mediastinal contours. No confluent airspace disease, pleural effusion or pneumothorax. No pulmonary edema. No acute osseous findings. IMPRESSION: No acute chest findings. Electronically Signed   By: Narda Rutherford M.D.   On: 02/04/2024 23:44    Procedures Procedures    Medications Ordered in ED Medications  albuterol (VENTOLIN HFA) 108 (90 Base) MCG/ACT inhaler 2 puff (has no administration in time range)  ipratropium-albuterol (DUONEB) 0.5-2.5 (3) MG/3ML nebulizer solution 3 mL (has no administration in time range)    ED Course/ Medical Decision Making/ A&P  Patient with history of ischemic cardiomyopathy and stents as described in the HPI.  Patient presenting with congestion and cough.  Patient arrives with stable vital signs and is afebrile.  Physical examination unremarkable and lungs are clear.  Laboratory studies obtained including CBC, CMP, and troponin.   Troponin mildly elevated at 25, however this is his baseline.  BNP is 127.  Chest x-ray showing no acute chest findings.  Patient given DuoNeb and seems to be feeling somewhat better.  Symptoms are most likely infectious in nature.  He does not appear to be in heart failure and I highly doubt a cardiac etiology.  He does sound congested and patient will be treated for sinusitis/bronchitis with antibiotics and discharged to home.  Final Clinical Impression(s) / ED Diagnoses Final diagnoses:  None    Rx / DC Orders ED Discharge Orders     None         Geoffery Lyons, MD 02/05/24 [phone]

## 2024-02-05 NOTE — ED Notes (Signed)
 [facility] called at this timeto transport pt back to

## 2024-02-05 NOTE — ED Notes (Signed)
 [facility] called to [facility] to [facility] [place] . Daniel Li

## 2024-02-06 NOTE — Progress Notes (Signed)
 No ICM remote transmission received for 02/03/2024 and next ICM transmission scheduled for 02/17/2024.

## 2024-02-10 ENCOUNTER — Ambulatory Visit (INDEPENDENT_AMBULATORY_CARE_PROVIDER_SITE_OTHER): Payer: Medicare Other

## 2024-02-10 DIAGNOSIS — I5022 Chronic systolic (congestive) heart failure: Secondary | ICD-10-CM | POA: Diagnosis not present

## 2024-02-10 DIAGNOSIS — I255 Ischemic cardiomyopathy: Secondary | ICD-10-CM

## 2024-02-13 LAB — CUP PACEART REMOTE DEVICE CHECK
Battery Remaining Longevity: 48 mo
Battery Remaining Percentage: 45 %
Battery Voltage: 2.93 V
Brady Statistic RV Percent Paced: 1 %
Date Time Interrogation Session: 20250416095009
HighPow Impedance: 62 Ohm
HighPow Impedance: 62 Ohm
Implantable Lead Connection Status: 753985
Implantable Lead Implant Date: 20181120
Implantable Lead Location: 753860
Implantable Pulse Generator Implant Date: 20181120
Lead Channel Impedance Value: 350 Ohm
Lead Channel Pacing Threshold Amplitude: 1.25 V
Lead Channel Pacing Threshold Pulse Width: 0.5 ms
Lead Channel Sensing Intrinsic Amplitude: 10.7 mV
Lead Channel Setting Pacing Amplitude: 2.5 V
Lead Channel Setting Pacing Pulse Width: 0.5 ms
Lead Channel Setting Sensing Sensitivity: 0.5 mV
Pulse Gen Serial Number: 9786940
Zone Setting Status: 755011

## 2024-02-14 ENCOUNTER — Encounter: Attending: Cardiovascular Disease

## 2024-02-14 DIAGNOSIS — I5042 Chronic combined systolic (congestive) and diastolic (congestive) heart failure: Secondary | ICD-10-CM

## 2024-02-14 DIAGNOSIS — Z95810 Presence of automatic (implantable) cardiac defibrillator: Secondary | ICD-10-CM | POA: Diagnosis not present

## 2024-02-14 NOTE — Progress Notes (Signed)
 EPIC Encounter for ICM Monitoring  Patient Name: Daniel Li is a 68 y.o. male Date: 02/14/2024 Primary Care Physican: Orlena Bitters, MD Primary Cardiologist: [facility] Electrophysiologist: [facility] 08/10/2022 Office Weight: 140 lbs                11/05/2023 Weight: 150 lbs                                   Transmission results reviewed.    CorVue thoracic impedance suggesting normal fluid levels with the exception of possible fluid accumulation from 3/26-4/4.      Prescribed dosage:  Torsemide  20 mg Take 1 tablet(s) (20 mg total) by mouth daily Potassium 10 mEq take 2 tablets (20 mEq total) by mouth daily Spironolactone  25 mg take 1 tablet (25 mg total) daily   Labs: 02/05/2024 Creatinine 1.00, BUN 11, Potassium 3.5, Sodium 139, GFR >60, BNP 127.0 01/23/2024 Creatinine 1.08, BUN 17, Potassium 3.9, Sodium 135, GFR >60, BNP 86.0 01/22/2024 Creatinine 1.60, BUN 21, Potassium 3.9, Sodium 139, GFR 47  12/26/2023 Creatinine 1.45, BUN 20, Potassium 4.1, Sodium 140, GFR 53 A complete set of results can be found in Results Review.   Recommendations:  No changes.   Follow-up plan: ICM clinic phone appointment on 03/16/2024.    91 day device clinic remote transmission 02/10/2024.     EP/Cardiology Office Visits:    Recall 03/29/2024 with Dr Mitzie Anda.    Message sent to EP scheduler to call pt for over due appt.  Recall 04/04/2023 with Merwyn Achilles, PA.     Copy of ICM check sent to Dr. Arlester Ladd.  3 month ICM trend: 02/12/2024.    12-14 Month ICM trend:     Almyra Jain, RN 02/14/2024 7:29 AM

## 2024-02-17 ENCOUNTER — Encounter

## 2024-02-25 ENCOUNTER — Encounter: Payer: Self-pay | Admitting: Cardiovascular Disease
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
## 2024-03-03 ENCOUNTER — Encounter: Payer: Self-pay | Admitting: Vascular Surgery

## 2024-03-03 ENCOUNTER — Ambulatory Visit (INDEPENDENT_AMBULATORY_CARE_PROVIDER_SITE_OTHER): Admitting: Vascular Surgery

## 2024-03-03 VITALS — BP 89/63 | HR 73 | Temp 98.1°F | Resp 18 | Ht 66.0 in | Wt 143.0 lb

## 2024-03-03 DIAGNOSIS — I7143 Infrarenal abdominal aortic aneurysm, without rupture: Secondary | ICD-10-CM | POA: Diagnosis not present

## 2024-03-03 DIAGNOSIS — I714 Abdominal aortic aneurysm, without rupture, unspecified: Secondary | ICD-10-CM | POA: Insufficient documentation

## 2024-03-11 ENCOUNTER — Other Ambulatory Visit (HOSPITAL_COMMUNITY): Payer: Self-pay | Admitting: Cardiology

## 2024-03-11 NOTE — Progress Notes (Unsigned)
 Electrophysiology Office Note:    Date:  03/12/2024   ID:  Daniel Li, DOB 12/31/1955, MRN [id number]  PCP:  Orlena Bitters, MD   [facility] Providers Cardiologist:  Armida Lander, MD Electrophysiologist:  Jolly Needle, MD (Inactive)  Advanced Heart Failure:  Peder Bourdon, MD     Referring MD: Orlena Bitters, MD   History of Present Illness:    Daniel Li is a 68 y.o. 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      His history of myocardial infarction: NSTEMI in 2007 -- bare-metal stent to the D1 and OM2.   STEMI in 2013 -- bare-metal stent placed to RCA NSTEMI in 2014 --CTO of LCx treated medically STEMI in 2018 drug-eluting stent to LAD NSTEMI 2019 drug-eluting stent to M LAD  He has an Abbott single-chamber ICD implanted by Dr. Nunzio Belch November 2018.  He was last seen in our clinic in June 2023.  He has had 2 admissions in the past 2 months for atypical chest pain and for shortness of breath.  Discussed the use of AI scribe software for clinical note transcription with the patient, who gave verbal consent to proceed.  History of Present Illness Daniel Li is a 68 year old male with a history of multiple heart attacks and strokes who presents for a defibrillator check.  He feels 'crazy' and 'off', experiencing weakness on one side and facial droop, which is unusual for him. He has a history of strokes. No chest pain, shortness of breath, or palpitations.  He is here for a routine check of his ICD to ensure its proper functioning.         Today, He reports that he is dizzier and weak and at baseline, with some focal weakness.  EKGs/Labs/Other Studies Reviewed Today:     Echocardiogram:  TTE January 23, 2024 EF 20 to 25%.  Swirling of contrast in the LV  apex without definitive thrombus.  Wall motion abnormalities noted.  Low normal RV function.  Moderately dilated left atrial size.    EKG:         Physical Exam:    VS:  BP 99/64 (BP Location: Right Arm, Patient Position: Sitting, Cuff Size: Normal)   Pulse 78   Ht 5\' 6"  (1.676 m)   Wt 149 lb (67.6 kg)   SpO2 98%   BMI 24.05 kg/m     Wt Readings from Last 3 Encounters:  03/12/24 149 lb (67.6 kg)  03/03/24 143 lb (64.9 kg)  02/04/24 144 lb 10 oz (65.6 kg)     GEN: Well nourished, well developed in no acute distress CARDIAC: RRR, no murmurs, rubs, gallops RESPIRATORY:  Normal work of breathing MUSCULOSKELETAL: no edema    ASSESSMENT & PLAN:     CHFrEF, Ischemic cardiomyopathy He has had multiple MIs and revascularizations There is a large region of myocardial scar Primary prevention single-chamber ICD in place Continue digoxin  0.125, empagliflozin  10 mg, Entresto  24-26 mg, spironolactone  25 mg  Abbott single-chamber ICD I reviewed today's device interrogation.  See Paceart for details Device is functioning normally No device detected events He is not device dependent  History of nonsustained VT  On amiodarone   History of severe MR Underwent evaluation for TEER In follow-up MR had improved   Stroke symptoms He reports dizziness, weakness, particularly on the left side  Facial droop noted by staff He appears to be to have a slight degree of left facial droop Right grip strength is > left, though he reports this is chronic Patient is being sent to the ER for further evaluation    Signed, Efraim Grange, MD  03/12/2024 11:45 AM    [facility]

## 2024-03-12 ENCOUNTER — Emergency Department (HOSPITAL_COMMUNITY)

## 2024-03-12 ENCOUNTER — Ambulatory Visit: Attending: Cardiovascular Disease | Admitting: Cardiovascular Disease

## 2024-03-12 ENCOUNTER — Other Ambulatory Visit: Payer: Self-pay

## 2024-03-12 ENCOUNTER — Encounter

## 2024-03-12 ENCOUNTER — Inpatient Hospital Stay (HOSPITAL_COMMUNITY)
Admission: EM | Admit: 2024-03-12 | Discharge: 2024-03-17 | DRG: 312 | Disposition: A | Discharge status: Home / Self Care | Attending: Family Medicine | Admitting: Family Medicine

## 2024-03-12 ENCOUNTER — Encounter (HOSPITAL_COMMUNITY): Payer: Self-pay

## 2024-03-12 ENCOUNTER — Encounter: Payer: Self-pay | Admitting: Cardiovascular Disease

## 2024-03-12 VITALS — BP 99/64 | HR 78 | Ht 66.0 in | Wt 149.0 lb

## 2024-03-12 DIAGNOSIS — Z789 Other specified health status: Secondary | ICD-10-CM

## 2024-03-12 DIAGNOSIS — I6523 Occlusion and stenosis of bilateral carotid arteries: Secondary | ICD-10-CM | POA: Diagnosis present

## 2024-03-12 DIAGNOSIS — E039 Hypothyroidism, unspecified: Secondary | ICD-10-CM | POA: Diagnosis present

## 2024-03-12 DIAGNOSIS — Z95810 Presence of automatic (implantable) cardiac defibrillator: Secondary | ICD-10-CM | POA: Diagnosis not present

## 2024-03-12 DIAGNOSIS — I255 Ischemic cardiomyopathy: Secondary | ICD-10-CM

## 2024-03-12 DIAGNOSIS — R7989 Other specified abnormal findings of blood chemistry: Secondary | ICD-10-CM | POA: Diagnosis not present

## 2024-03-12 DIAGNOSIS — Z66 Do not resuscitate: Secondary | ICD-10-CM | POA: Diagnosis present

## 2024-03-12 DIAGNOSIS — I110 Hypertensive heart disease with heart failure: Secondary | ICD-10-CM | POA: Diagnosis present

## 2024-03-12 DIAGNOSIS — I69354 Hemiplegia and hemiparesis following cerebral infarction affecting left non-dominant side: Secondary | ICD-10-CM

## 2024-03-12 DIAGNOSIS — I2699 Other pulmonary embolism without acute cor pulmonale: Secondary | ICD-10-CM | POA: Diagnosis not present

## 2024-03-12 DIAGNOSIS — I724 Aneurysm of artery of lower extremity: Secondary | ICD-10-CM | POA: Diagnosis not present

## 2024-03-12 DIAGNOSIS — I6529 Occlusion and stenosis of unspecified carotid artery: Secondary | ICD-10-CM | POA: Insufficient documentation

## 2024-03-12 DIAGNOSIS — M5030 Other cervical disc degeneration, unspecified cervical region: Secondary | ICD-10-CM | POA: Diagnosis present

## 2024-03-12 DIAGNOSIS — R29810 Facial weakness: Secondary | ICD-10-CM | POA: Diagnosis not present

## 2024-03-12 DIAGNOSIS — R9431 Abnormal electrocardiogram [ECG] [EKG]: Secondary | ICD-10-CM | POA: Diagnosis not present

## 2024-03-12 DIAGNOSIS — I6503 Occlusion and stenosis of bilateral vertebral arteries: Secondary | ICD-10-CM | POA: Diagnosis not present

## 2024-03-12 DIAGNOSIS — Z9861 Coronary angioplasty status: Secondary | ICD-10-CM | POA: Diagnosis not present

## 2024-03-12 DIAGNOSIS — I2510 Atherosclerotic heart disease of native coronary artery without angina pectoris: Secondary | ICD-10-CM | POA: Diagnosis present

## 2024-03-12 DIAGNOSIS — M7989 Other specified soft tissue disorders: Secondary | ICD-10-CM | POA: Diagnosis not present

## 2024-03-12 DIAGNOSIS — K219 Gastro-esophageal reflux disease without esophagitis: Secondary | ICD-10-CM | POA: Diagnosis present

## 2024-03-12 DIAGNOSIS — I714 Abdominal aortic aneurysm, without rupture, unspecified: Secondary | ICD-10-CM | POA: Diagnosis present

## 2024-03-12 DIAGNOSIS — I4729 Other ventricular tachycardia: Secondary | ICD-10-CM | POA: Diagnosis not present

## 2024-03-12 DIAGNOSIS — E7800 Pure hypercholesterolemia, unspecified: Secondary | ICD-10-CM | POA: Diagnosis present

## 2024-03-12 DIAGNOSIS — I951 Orthostatic hypotension: Principal | ICD-10-CM | POA: Diagnosis present

## 2024-03-12 DIAGNOSIS — R42 Dizziness and giddiness: Secondary | ICD-10-CM | POA: Diagnosis not present

## 2024-03-12 DIAGNOSIS — R200 Anesthesia of skin: Secondary | ICD-10-CM

## 2024-03-12 DIAGNOSIS — I63511 Cerebral infarction due to unspecified occlusion or stenosis of right middle cerebral artery: Secondary | ICD-10-CM | POA: Diagnosis not present

## 2024-03-12 DIAGNOSIS — R9082 White matter disease, unspecified: Secondary | ICD-10-CM | POA: Diagnosis not present

## 2024-03-12 DIAGNOSIS — G319 Degenerative disease of nervous system, unspecified: Secondary | ICD-10-CM | POA: Diagnosis not present

## 2024-03-12 DIAGNOSIS — F17210 Nicotine dependence, cigarettes, uncomplicated: Secondary | ICD-10-CM | POA: Diagnosis present

## 2024-03-12 DIAGNOSIS — N179 Acute kidney failure, unspecified: Secondary | ICD-10-CM | POA: Diagnosis present

## 2024-03-12 DIAGNOSIS — D649 Anemia, unspecified: Secondary | ICD-10-CM | POA: Insufficient documentation

## 2024-03-12 DIAGNOSIS — E876 Hypokalemia: Secondary | ICD-10-CM | POA: Diagnosis not present

## 2024-03-12 DIAGNOSIS — E119 Type 2 diabetes mellitus without complications: Secondary | ICD-10-CM | POA: Diagnosis present

## 2024-03-12 DIAGNOSIS — Z91041 Radiographic dye allergy status: Secondary | ICD-10-CM | POA: Diagnosis not present

## 2024-03-12 DIAGNOSIS — M159 Polyosteoarthritis, unspecified: Secondary | ICD-10-CM | POA: Diagnosis present

## 2024-03-12 DIAGNOSIS — Z8249 Family history of ischemic heart disease and other diseases of the circulatory system: Secondary | ICD-10-CM

## 2024-03-12 DIAGNOSIS — Z79899 Other long term (current) drug therapy: Secondary | ICD-10-CM | POA: Diagnosis not present

## 2024-03-12 DIAGNOSIS — K625 Hemorrhage of anus and rectum: Secondary | ICD-10-CM | POA: Diagnosis not present

## 2024-03-12 DIAGNOSIS — I69322 Dysarthria following cerebral infarction: Secondary | ICD-10-CM

## 2024-03-12 DIAGNOSIS — Z7984 Long term (current) use of oral hypoglycemic drugs: Secondary | ICD-10-CM

## 2024-03-12 DIAGNOSIS — E860 Dehydration: Secondary | ICD-10-CM | POA: Diagnosis present

## 2024-03-12 DIAGNOSIS — R471 Dysarthria and anarthria: Secondary | ICD-10-CM | POA: Diagnosis not present

## 2024-03-12 DIAGNOSIS — I6501 Occlusion and stenosis of right vertebral artery: Secondary | ICD-10-CM | POA: Diagnosis not present

## 2024-03-12 DIAGNOSIS — I5042 Chronic combined systolic (congestive) and diastolic (congestive) heart failure: Secondary | ICD-10-CM

## 2024-03-12 DIAGNOSIS — Z955 Presence of coronary angioplasty implant and graft: Secondary | ICD-10-CM

## 2024-03-12 DIAGNOSIS — G8929 Other chronic pain: Secondary | ICD-10-CM | POA: Diagnosis present

## 2024-03-12 DIAGNOSIS — R202 Paresthesia of skin: Secondary | ICD-10-CM | POA: Diagnosis not present

## 2024-03-12 DIAGNOSIS — E1165 Type 2 diabetes mellitus with hyperglycemia: Secondary | ICD-10-CM | POA: Diagnosis not present

## 2024-03-12 DIAGNOSIS — Z91014 Allergy to mammalian meats: Secondary | ICD-10-CM

## 2024-03-12 DIAGNOSIS — Z823 Family history of stroke: Secondary | ICD-10-CM

## 2024-03-12 DIAGNOSIS — E782 Mixed hyperlipidemia: Secondary | ICD-10-CM | POA: Diagnosis not present

## 2024-03-12 DIAGNOSIS — I6930 Unspecified sequelae of cerebral infarction: Secondary | ICD-10-CM

## 2024-03-12 DIAGNOSIS — I6782 Cerebral ischemia: Secondary | ICD-10-CM | POA: Diagnosis not present

## 2024-03-12 DIAGNOSIS — Z79890 Hormone replacement therapy (postmenopausal): Secondary | ICD-10-CM

## 2024-03-12 DIAGNOSIS — R0902 Hypoxemia: Secondary | ICD-10-CM | POA: Diagnosis not present

## 2024-03-12 DIAGNOSIS — R29898 Other symptoms and signs involving the musculoskeletal system: Principal | ICD-10-CM

## 2024-03-12 DIAGNOSIS — R55 Syncope and collapse: Secondary | ICD-10-CM | POA: Diagnosis present

## 2024-03-12 DIAGNOSIS — R531 Weakness: Secondary | ICD-10-CM | POA: Diagnosis not present

## 2024-03-12 DIAGNOSIS — Z7901 Long term (current) use of anticoagulants: Secondary | ICD-10-CM

## 2024-03-12 DIAGNOSIS — G9389 Other specified disorders of brain: Secondary | ICD-10-CM | POA: Diagnosis not present

## 2024-03-12 DIAGNOSIS — I5022 Chronic systolic (congestive) heart failure: Secondary | ICD-10-CM | POA: Diagnosis present

## 2024-03-12 DIAGNOSIS — F1090 Alcohol use, unspecified, uncomplicated: Secondary | ICD-10-CM | POA: Diagnosis not present

## 2024-03-12 DIAGNOSIS — E861 Hypovolemia: Secondary | ICD-10-CM | POA: Diagnosis not present

## 2024-03-12 DIAGNOSIS — I6602 Occlusion and stenosis of left middle cerebral artery: Secondary | ICD-10-CM | POA: Diagnosis not present

## 2024-03-12 DIAGNOSIS — I252 Old myocardial infarction: Secondary | ICD-10-CM

## 2024-03-12 LAB — COMPREHENSIVE METABOLIC PANEL WITH GFR
ALT: 35 U/L (ref 0–44)
AST: 58 U/L — ABNORMAL HIGH (ref 15–41)
Albumin: 3.8 g/dL (ref 3.5–5.0)
Alkaline Phosphatase: 61 U/L (ref 38–126)
Anion gap: 10 (ref 5–15)
BUN: 16 mg/dL (ref 8–23)
CO2: 28 mmol/L (ref 22–32)
Calcium: 8.8 mg/dL — ABNORMAL LOW (ref 8.9–10.3)
Chloride: 99 mmol/L (ref 98–111)
Creatinine, Ser: 1.28 mg/dL — ABNORMAL HIGH (ref 0.61–1.24)
GFR, Estimated: >60 mL/min (ref >60)
Glucose, Bld: 109 mg/dL — ABNORMAL HIGH (ref 70–99)
Potassium: 4.2 mmol/L (ref 3.5–5.1)
Sodium: 137 mmol/L (ref 135–145)
Total Bilirubin: 0.5 mg/dL (ref 0.0–1.2)
Total Protein: 6.5 g/dL (ref 6.5–8.1)

## 2024-03-12 LAB — I-STAT CHEM 8, ED
BUN: 22 mg/dL (ref 8–23)
Calcium, Ion: 0.99 mmol/L — ABNORMAL LOW (ref 1.15–1.40)
Chloride: 98 mmol/L (ref 98–111)
Creatinine, Ser: 1.3 mg/dL — ABNORMAL HIGH (ref 0.61–1.24)
Glucose, Bld: 102 mg/dL — ABNORMAL HIGH (ref 70–99)
HCT: 47 % (ref 39.0–52.0)
Hemoglobin: 16 g/dL (ref 13.0–17.0)
Potassium: 4.6 mmol/L (ref 3.5–5.1)
Sodium: 136 mmol/L (ref 135–145)
TCO2: 30 mmol/L (ref 22–32)

## 2024-03-12 LAB — DIFFERENTIAL
Abs Immature Granulocytes: 0.02 10*3/uL (ref 0.00–0.07)
Basophils Absolute: 0 10*3/uL (ref 0.0–0.1)
Basophils Relative: 1 %
Eosinophils Absolute: 0 10*3/uL (ref 0.0–0.5)
Eosinophils Relative: 1 %
Immature Granulocytes: 0 %
Lymphocytes Relative: 24 %
Lymphs Abs: 1.5 10*3/uL (ref 0.7–4.0)
Monocytes Absolute: 0.4 10*3/uL (ref 0.1–1.0)
Monocytes Relative: 6 %
Neutro Abs: 4.3 10*3/uL (ref 1.7–7.7)
Neutrophils Relative %: 68 %

## 2024-03-12 LAB — RAPID URINE DRUG SCREEN, HOSP PERFORMED
Amphetamines: NOT DETECTED
Barbiturates: NOT DETECTED
Benzodiazepines: NOT DETECTED
Cocaine: NOT DETECTED
Opiates: NOT DETECTED
Tetrahydrocannabinol: NOT DETECTED

## 2024-03-12 LAB — CUP PACEART INCLINIC DEVICE CHECK
Battery Remaining Longevity: 49 mo
Brady Statistic RV Percent Paced: 0.03 %
Date Time Interrogation Session: 20250515120103
HighPow Impedance: 66 Ohm
HighPow Impedance: 66.375
Implantable Lead Connection Status: 753985
Implantable Lead Implant Date: 20181120
Implantable Lead Location: 753860
Implantable Pulse Generator Implant Date: 20181120
Lead Channel Impedance Value: 362.5 Ohm
Lead Channel Pacing Threshold Amplitude: 1 V
Lead Channel Pacing Threshold Amplitude: 1 V
Lead Channel Pacing Threshold Pulse Width: 0.5 ms
Lead Channel Pacing Threshold Pulse Width: 0.5 ms
Lead Channel Sensing Intrinsic Amplitude: 12 mV
Lead Channel Setting Pacing Amplitude: 2.5 V
Lead Channel Setting Pacing Pulse Width: 0.5 ms
Lead Channel Setting Sensing Sensitivity: 0.5 mV
Pulse Gen Serial Number: 9786940
Zone Setting Status: 755011

## 2024-03-12 LAB — CBC
HCT: 44.4 % (ref 39.0–52.0)
Hemoglobin: 14 g/dL (ref 13.0–17.0)
MCH: 26 pg (ref 26.0–34.0)
MCHC: 31.5 g/dL (ref 30.0–36.0)
MCV: 82.5 fL (ref 80.0–100.0)
Platelets: 271 10*3/uL (ref 150–400)
RBC: 5.38 MIL/uL (ref 4.22–5.81)
RDW: 16.8 % — ABNORMAL HIGH (ref 11.5–15.5)
WBC: 6.3 10*3/uL (ref 4.0–10.5)
nRBC: 0 % (ref 0.0–0.2)

## 2024-03-12 LAB — URINALYSIS, ROUTINE W REFLEX MICROSCOPIC
Bacteria, UA: NONE SEEN
Bilirubin Urine: NEGATIVE
Glucose, UA: >=500 mg/dL — AB
Hgb urine dipstick: NEGATIVE
Ketones, ur: NEGATIVE mg/dL
Leukocytes,Ua: NEGATIVE
Nitrite: NEGATIVE
Protein, ur: NEGATIVE mg/dL
Specific Gravity, Urine: 1.023 (ref 1.005–1.030)
pH: 5 (ref 5.0–8.0)

## 2024-03-12 LAB — PROTIME-INR
INR: 1.6 — ABNORMAL HIGH (ref 0.8–1.2)
Prothrombin Time: 19 s — ABNORMAL HIGH (ref 11.4–15.2)

## 2024-03-12 LAB — CBG MONITORING, ED
Glucose-Capillary: 110 mg/dL — ABNORMAL HIGH (ref 70–99)
Glucose-Capillary: 108 mg/dL — ABNORMAL HIGH (ref 70–99)

## 2024-03-12 LAB — APTT: aPTT: 30 s (ref 24–36)

## 2024-03-12 LAB — ETHANOL: Alcohol, Ethyl (B): <15 mg/dL (ref <15)

## 2024-03-12 MED ORDER — LACTATED RINGERS IV BOLUS
1000.0000 mL | Freq: Once | INTRAVENOUS | Status: AC
  Administered 2024-03-12: 1000 mL via INTRAVENOUS

## 2024-03-12 MED ORDER — AMIODARONE HCL 200 MG PO TABS
200.0000 mg | ORAL_TABLET | Freq: Every day | ORAL | Status: DC
  Administered 2024-03-13 – 2024-03-17 (×4): 200 mg via ORAL
  Filled 2024-03-12 (×6): qty 1

## 2024-03-12 MED ORDER — PANTOPRAZOLE SODIUM 40 MG PO TBEC
40.0000 mg | DELAYED_RELEASE_TABLET | Freq: Every day | ORAL | Status: DC
  Administered 2024-03-13 – 2024-03-17 (×5): 40 mg via ORAL
  Filled 2024-03-12 (×5): qty 1

## 2024-03-12 MED ORDER — ACETAMINOPHEN 650 MG RE SUPP: 650.0000 mg | Freq: Four times a day (QID) | RECTAL | Status: DC | PRN

## 2024-03-12 MED ORDER — IOHEXOL 350 MG/ML SOLN
100.0000 mL | Freq: Once | INTRAVENOUS | Status: AC | PRN
  Administered 2024-03-12: 100 mL via INTRAVENOUS

## 2024-03-12 MED ORDER — APIXABAN 5 MG PO TABS
5.0000 mg | ORAL_TABLET | Freq: Two times a day (BID) | ORAL | Status: DC
Start: 2024-03-12 — End: 2024-03-13
  Administered 2024-03-12 – 2024-03-13 (×2): 5 mg via ORAL
  Filled 2024-03-12 (×2): qty 1

## 2024-03-12 MED ORDER — LIDOCAINE 5 % EX PTCH
1.0000 | MEDICATED_PATCH | CUTANEOUS | Status: DC
  Administered 2024-03-13 – 2024-03-17 (×4): 1 via TRANSDERMAL
  Filled 2024-03-12 (×5): qty 1

## 2024-03-12 MED ORDER — EZETIMIBE 10 MG PO TABS
10.0000 mg | ORAL_TABLET | Freq: Every day | ORAL | Status: DC
  Administered 2024-03-13 – 2024-03-17 (×5): 10 mg via ORAL
  Filled 2024-03-12 (×5): qty 1

## 2024-03-12 MED ORDER — GABAPENTIN 100 MG PO CAPS
100.0000 mg | ORAL_CAPSULE | Freq: Two times a day (BID) | ORAL | Status: DC
  Administered 2024-03-12 – 2024-03-17 (×10): 100 mg via ORAL
  Filled 2024-03-12 (×10): qty 1

## 2024-03-12 MED ORDER — INSULIN ASPART 100 UNIT/ML IJ SOLN
0.0000 [IU] | Freq: Three times a day (TID) | INTRAMUSCULAR | Status: DC
  Administered 2024-03-17 (×2): 1 [IU] via SUBCUTANEOUS

## 2024-03-12 MED ORDER — ACETAMINOPHEN 325 MG PO TABS
650.0000 mg | ORAL_TABLET | Freq: Four times a day (QID) | ORAL | Status: DC | PRN
  Administered 2024-03-13 – 2024-03-16 (×4): 650 mg via ORAL
  Filled 2024-03-12 (×4): qty 2

## 2024-03-12 MED ORDER — DIGOXIN 125 MCG PO TABS
0.1250 mg | ORAL_TABLET | Freq: Every day | ORAL | Status: DC
  Administered 2024-03-13 – 2024-03-17 (×4): 0.125 mg via ORAL
  Filled 2024-03-12 (×5): qty 1

## 2024-03-12 MED ORDER — EMPAGLIFLOZIN 10 MG PO TABS: 10.0000 mg | ORAL_TABLET | Freq: Every day | ORAL | Status: DC

## 2024-03-12 NOTE — Code Documentation (Signed)
 Stroke Response Nurse Documentation Code Documentation  Daniel Li is a 68 y.o. male arriving to [facility]  via [facility] on 03/12/2024 with past medical hx of CHF CAD PE HTN Previous strokes. On Eliquis  (apixaban ) daily. Code stroke was activated by EMS.   [facility] L[facility] at 1000 and now complaining of Left sided weakness and numbness as well as dizziness. Initially thought to be new symptoms, but upon discussion with pt his left sided symptoms are chronic, but he does feel dizzy today.   Stroke team at the bedside on patient arrival. Labs drawn and patient cleared for CT by Dr. Manus Sellers. Patient to CT with team. NIHSS 4, see documentation for details and code stroke times. Patient with left arm weakness, left leg weakness, left decreased sensation, and dysarthria  on exam. The following imaging was completed:  CT Head and CTA. Patient is not a candidate for IV Thrombolytic due to Eliquis  use. Patient is not a candidate for IR due to LVO negative.   Care Plan: NIHSS and BP q 2 x 12 then q 4..     Bedside handoff with ED RN Jyl Or.    Jannell Franta Livengood  Stroke Response RN

## 2024-03-12 NOTE — Assessment & Plan Note (Addendum)
 Mild - possibly just dehydration/volume depletion contributing to symptomology.  Baseline creatinine appears to be around 1.0; creatinine on admission 1.28.  S/p 1 L LR bolus in the ED. -Avoid nephrotoxic agents -Holding home spironolactone , torsemide , jardiance  -Continue to encourage good PO - AM BMP

## 2024-03-12 NOTE — ED Provider Notes (Signed)
 [facility] AT [facility] Provider Note   CSN: [id number] Arrival date & time: 03/12/24  1218     History  No chief complaint on file.   Daniel Li is a 68 y.o. male.  The history is provided by the patient and medical records. No language interpreter was used.  Neurologic Problem This is a new problem. The current episode started 1 to 2 hours ago. The problem occurs constantly. The problem has not changed since onset.Pertinent negatives include no chest pain, no abdominal pain, no headaches and no shortness of breath. Nothing aggravates the symptoms. Nothing relieves the symptoms. He has tried nothing for the symptoms. The treatment provided no relief.       Home Medications Prior to Admission medications   Medication Sig Start Date End Date Taking? Authorizing Provider  acetaminophen  (TYLENOL ) 325 MG tablet Take 650 mg by mouth every 6 (six) hours as needed for mild pain.    [provider]  albuterol  (VENTOLIN  HFA) 108 (90 Base) MCG/ACT inhaler Inhale 2 puffs into the lungs every 4 (four) hours as needed for wheezing or shortness of breath. 06/19/23   Early Glisson, MD  amiodarone  (PACERONE ) 200 MG tablet Take 1 tablet (200 mg total) by mouth daily. 07/16/23   Arleene Belt, PA-C  apixaban  (ELIQUIS ) 5 MG TABS tablet Take 5 mg by mouth 2 (two) times daily.    [provider]  digoxin  (LANOXIN ) 0.125 MG tablet TAKE (1) TABLET BY MOUTH ONCE DAILY. 09/09/23   Darlis Eisenmenger, MD  doxycycline  (VIBRAMYCIN ) 100 MG capsule Take 1 capsule (100 mg total) by mouth 2 (two) times daily. One po bid x 7 days 02/05/24   Orvilla Blander, MD  empagliflozin  (JARDIANCE ) 10 MG TABS tablet Take 10 mg by mouth daily.    [provider]  ENTRESTO  24-26 MG TAKE (1) TABLET BY MOUTH TWICE DAILY. 01/09/24   Darlis Eisenmenger, MD  ezetimibe  (ZETIA ) 10 MG tablet Take 1 tablet (10 mg total) by mouth daily. 03/27/17   Laurann Pollock, MD   gabapentin  (NEURONTIN ) 100 MG capsule Take 100 mg by mouth 2 (two) times daily.    [provider]  lidocaine  (LIDODERM ) 5 % Place 1 patch onto the skin every 12 (twelve) hours. Leave off 12 hours 05/27/23   [provider]  metFORMIN  (GLUCOPHAGE ) 500 MG tablet Take 500 mg by mouth daily with breakfast.    [provider]  nitroGLYCERIN  (NITROSTAT ) 0.4 MG SL tablet PLACE ONE (1) TABLET UNDER TONGUE EVERY 5 MINUTES UP TO (3) DOSES AS NEEDED FOR CHEST PAIN. IF NO RELIEF, CONTACT MD. 09/29/19   Laurann Pollock, MD  pantoprazole  (PROTONIX ) 40 MG tablet Take 40 mg by mouth daily.    [provider]  potassium chloride  (KLOR-CON ) 10 MEQ tablet Take 2 tablets (20 mEq total) by mouth daily. 11/06/23 01/23/24  Darlis Eisenmenger, MD  spironolactone  (ALDACTONE ) 25 MG tablet Take 1 tablet (25 mg total) by mouth daily. 06/10/23   Darlis Eisenmenger, MD  torsemide  (DEMADEX ) 20 MG tablet TAKE (1) TABLET BY MOUTH ONCE DAILY. 01/24/24   Darlis Eisenmenger, MD      Allergies    Contrast media [iodinated contrast media] and Pork-derived products    Review of Systems   Review of Systems  Constitutional:  Negative for chills, fatigue and fever.  HENT:  Negative for congestion.   Eyes:  Negative for visual disturbance.  Respiratory:  Negative for cough,  chest tightness, shortness of breath and wheezing.   Cardiovascular:  Negative for chest pain.  Gastrointestinal:  Negative for abdominal pain, constipation, diarrhea, nausea and vomiting.  Genitourinary:  Negative for flank pain.  Musculoskeletal:  Negative for back pain, neck pain and neck stiffness.  Skin:  Negative for rash and wound.  Neurological:  Positive for speech difficulty (per ems report), weakness and numbness. Negative for syncope and headaches.  Psychiatric/Behavioral:  Negative for agitation and confusion.   All other systems reviewed and are negative.   Physical Exam Updated Vital Signs BP 90/66   Pulse 67    Temp (!) 97.4 F (36.3 C) (Temporal)   Resp 15   Ht 5\' 6"  (1.676 m)   Wt 68 kg   SpO2 98%   BMI 24.21 kg/m  Physical Exam Vitals and nursing note reviewed.  Constitutional:      General: He is not in acute distress.    Appearance: He is well-developed. He is not ill-appearing, toxic-appearing or diaphoretic.  HENT:     Head: Normocephalic and atraumatic.     Nose: No congestion or rhinorrhea.     Mouth/Throat:     Mouth: Mucous membranes are moist.  Eyes:     Extraocular Movements: Extraocular movements intact.     Conjunctiva/sclera: Conjunctivae normal.     Pupils: Pupils are equal, round, and reactive to light.  Cardiovascular:     Rate and Rhythm: Normal rate and regular rhythm.     Heart sounds: No murmur heard. Pulmonary:     Effort: Pulmonary effort is normal. No respiratory distress.     Breath sounds: Normal breath sounds. No wheezing, rhonchi or rales.  Chest:     Chest wall: No tenderness.  Abdominal:     Palpations: Abdomen is soft.     Tenderness: There is no abdominal tenderness. There is no guarding or rebound.  Musculoskeletal:        General: No swelling or tenderness.     Cervical back: Neck supple.     Right lower leg: No edema.     Left lower leg: No edema.  Skin:    General: Skin is warm and dry.     Capillary Refill: Capillary refill takes less than 2 seconds.     Findings: No erythema or rash.  Neurological:     Mental Status: He is alert.     Cranial Nerves: No facial asymmetry.     Sensory: Sensory deficit present.     Motor: Weakness present.     Comments: Left arm numbness and weakness, left leg numbness and weakness, left face numbness but no droop.  Psychiatric:        Mood and Affect: Mood normal.     ED Results / Procedures / Treatments   Labs (all labs ordered are listed, but only abnormal results are displayed) Labs Reviewed  PROTIME-INR - Abnormal; Notable for the following components:      Result Value   Prothrombin Time  19.0 (*)    INR 1.6 (*)    All other components within normal limits  CBC - Abnormal; Notable for the following components:   RDW 16.8 (*)    All other components within normal limits  COMPREHENSIVE METABOLIC PANEL WITH GFR - Abnormal; Notable for the following components:   Glucose, Bld 109 (*)    Creatinine, Ser 1.28 (*)    Calcium  8.8 (*)    AST 58 (*)    All other components within normal limits  I-STAT CHEM 8, ED - Abnormal; Notable for the following components:   Creatinine, Ser 1.30 (*)    Glucose, Bld 102 (*)    Calcium , Ion 0.99 (*)    All other components within normal limits  CBG MONITORING, ED - Abnormal; Notable for the following components:   Glucose-Capillary 110 (*)    All other components within normal limits  ETHANOL  APTT  DIFFERENTIAL  RAPID URINE DRUG SCREEN, HOSP PERFORMED    EKG EKG Interpretation Date/Time:  Thursday Mar 12 2024 12:50:15 EDT Ventricular Rate:  73 PR Interval:  214 QRS Duration:  112 QT Interval:  458 QTC Calculation: 505 R Axis:   6  Text Interpretation: Sinus rhythm Ventricular premature complex Non-specific ST-t changes No significant change since last tracing Confirmed by Guadalupe Lee ([id number]) on 03/12/2024 3:41:23 PM  Radiology CT ANGIO HEAD NECK W WO CM W PERF (CODE STROKE) Result Date: 03/12/2024 CLINICAL DATA:  68 year old male code stroke. History of bilateral MCA infarcts. EXAM: CT ANGIOGRAPHY HEAD AND NECK CT PERFUSION BRAIN TECHNIQUE: Multidetector CT imaging of the head and neck was performed using the standard protocol during bolus administration of intravenous contrast. Multiplanar CT image reconstructions and MIPs were obtained to evaluate the vascular anatomy. Carotid stenosis measurements (when applicable) are obtained utilizing NASCET criteria, using the distal internal carotid diameter as the denominator. Multiphase CT imaging of the brain was performed following IV bolus contrast injection. Subsequent parametric  perfusion maps were calculated using RAPID software. RADIATION DOSE REDUCTION: This exam was performed according to the departmental dose-optimization program which includes automated exposure control, adjustment of the mA and/or kV according to patient size and/or use of iterative reconstruction technique. CONTRAST:  OMNIPAQUE  IOHEXOL  350 MG/ML SOLN COMPARISON:  Plain head CT 1227 hours today. FINDINGS: CT Brain Perfusion Findings: ASPECTS: 10 CBF (<30%) Volume: 4mL, however appears to be in the area of chronic encephalomalacia. Matched CBV abnormality there also. Perfusion (Tmax>6.0s) volume: 0mL Mismatch Volume: Not applicable. Infarction Location:Right hemisphere artifacts suspected. CTA NECK Skeleton: Absent dentition. Mild for age cervical spine degeneration. No acute osseous abnormality identified. Upper chest: Left chest pacemaker. Calcified coronary artery atherosclerosis and/or stent. Negative visible upper lungs, mediastinum. Other neck: Nonvascular neck soft tissue spaces are within normal limits. Aortic arch: Bovine arch configuration. Soft more so than calcified arch atherosclerosis. Right carotid system: Tortuous brachiocephalic artery with soft plaque, no stenosis. Mildly tortuous right CCA origin. Mild calcified plaque at the right carotid bifurcation, right ICA origin and bulb without stenosis. Left carotid system: Bovine left CCA origin with mild soft plaque, no stenosis. Additional soft plaque before the bifurcation without stenosis. Minimal calcified plaque at the left ICA origin and bulb without stenosis. Vertebral arteries: Tortuous proximal right subclavian artery with mild plaque and no stenosis. Calcified plaque at the right vertebral artery origin with mild to moderate stenosis (series 6, image 262). Right vertebral remains patent, is tortuous in the neck and mildly dominant to the skull base with no additional plaque or stenosis. Proximal left subclavian artery soft and calcified  plaque. Calcified plaque is primarily at the left vertebral artery origin with at least moderate stenosis (series 10, image 146). Tortuous left V1 segment. The left vertebral artery remains patent and is tortuous, mildly non dominant to the skull base. CTA HEAD Posterior circulation: Patent distal vertebral arteries and vertebrobasilar junction. Right V4 calcified plaque with only mild stenosis. Normal left PICA origin. No significant left V4 stenosis. Right AICA appears patent and dominant. Patent basilar artery  without stenosis. Patent SCA and PCA origins. Posterior communicating arteries are diminutive or absent. Bilateral PCA branches are within normal limits. Anterior circulation: Both ICA siphons are patent. Left siphon moderate to severe supraclinoid calcified plaque and stenosis (series 10, image 137 and series 6, image 118 (appears severe). And contralateral similar bulky calcified plaque and severe right ICA siphon supraclinoid stenosis also on image 118. Despite that both carotid termini remain patent. Patent MCA and ACA origins. Anterior communicating artery and bilateral ACA branches are within normal limits. Left MCA M1 segment and bifurcation are patent without stenosis. Right MCA M1 segment and bifurcation are patent without stenosis. Bilateral MCA branches are within normal limits. Venous sinuses: Early contrast timing. Superior sagittal sinus appears patent. Anatomic variants: Mildly dominant right vertebral artery. Review of the MIP images confirms the above findings IMPRESSION: 1. CTA is negative for large vessel occlusion, but Positive for SEVERE BILATERAL supraclinoid ICA stenosis due to bulky calcified plaque. 2. CTP: Suspect artifact in the chronic Right MCA infarct. No oligemia detected. 3. Furthermore, up to Moderate stenosis of both vertebral artery origins, greater on the left. 4.  Aortic Atherosclerosis (ICD10-I70.0). #1, #2 were communicated to Dr. Bonnita Buttner at 1256 hours on 03/12/2024 by  text page via the [facility] messaging system. Electronically Signed   By: Marlise Simpers M.D.   On: 03/12/2024 12:58   CT HEAD CODE STROKE WO CONTRAST Result Date: 03/12/2024 CLINICAL DATA:  Code stroke.  68 year old male. EXAM: CT HEAD WITHOUT CONTRAST TECHNIQUE: Contiguous axial images were obtained from the base of the skull through the vertex without intravenous contrast. RADIATION DOSE REDUCTION: This exam was performed according to the departmental dose-optimization program which includes automated exposure control, adjustment of the mA and/or kV according to patient size and/or use of iterative reconstruction technique. COMPARISON:  Head CT 12/10/2022. FINDINGS: Brain: Stable cerebral volume from last year. Patchy chronic encephalomalacia in the posterior left MCA and middle/posterior right MCA territories is stable. Patchy additional asymmetric white matter hypodensity including at the left corona radiata and left operculum, external capsule also appears stable. Stable gray-white matter differentiation throughout the brain. No cortically based acute infarct identified. No midline shift, mass effect, or evidence of intracranial mass lesion. No acute intracranial hemorrhage identified. No ventriculomegaly. Vascular: Calcified atherosclerosis at the skull base. No suspicious intracranial vascular hyperdensity. Skull: Stable and intact. Sinuses/Orbits: Visualized paranasal sinuses and mastoids are stable and well aerated. Other: No gaze deviation. No acute orbit or scalp soft tissue finding. ASPECTS [facility] Stroke Program Early CT Score) Total score (0-10 with 10 being normal): 10, chronic bilateral encephalomalacia. IMPRESSION: 1. Stable non contrast CT appearance of chronic bilateral MCA infarcts, asymmetric white matter disease. 2. ASPECTS 10. No acute cortically based infarct or acute intracranial hemorrhage identified. 3. These results were communicated to Dr. Arora at 12:36 pm on 03/12/2024 by text page via the  [facility] messaging system. Electronically Signed   By: Marlise Simpers M.D.   On: 03/12/2024 12:36   CUP PACEART INCLINIC DEVICE CHECK Result Date: 03/12/2024 Normal in-clinic single chamber ICD check. Presenting Rhythm: VS. Routine testing was performed. Thresholds, sensing, and impedance demonstrate stable parameters and no programming changes needed. No treated arrhythmias. Estimated longevity 4 years. Pt enrolled in remote follow-up.Alline Areas, BSN, RN   Procedures Procedures    Medications Ordered in ED Medications  iohexol  (OMNIPAQUE ) 350 MG/ML injection 100 mL (100 mLs Intravenous Contrast Given 03/12/24 1245)    ED Course/ Medical Decision Making/ A&P  Medical Decision Making Amount and/or Complexity of Data Reviewed Labs: ordered. Radiology: ordered.    Daniel Li is a 68 y.o. male with a past medical history significant for hypertension, CHF, CAD status post PCI, ICD, AAA, and previous strokes who presents as a code stroke.  According to EMS report, patient was last normal at 10 AM when he noticed weakness and numbness in his left side.  He reports he has had left-sided troubles in the past but this seems worse and different.  He reports numbness in his left face, left arm, and left leg and has weakness in his left arm and left leg.  He otherwise denies fevers, chills, congestion, cough, nausea, vomiting, constipation, diarrhea, and urinary changes.  He reports no trauma.  He denies any falls.  He says this feels new.  Per neurology team, glucose was over 100 on arrival.  I met patient when he is already in the CT scanner.  On my exam he does have numbness and weakness in his left arm and left leg and numbness in the left face compared to his baseline.  He did not have dysarthria when he was speaking to him but that was also a complaint per EMS.  He reports no other complaints on my history gathering.  Lungs were clear and chest was nontender.   Abdomen was nontender.  Patient had intact pulses.  Clinically I am concerned about stroke.  Neurology will guide his management but anticipate further imaging and MRI.  Care transferred to oncoming team to wait for results of MRI.  Neurology felt that if MRI is reassuring, patient was likely stable for discharge home as this was likely not a stroke.  Anticipate reassessment of the patient, if he is still feeling well and vital signs are reassuring, patient may be safe for discharge home.         Final Clinical Impression(s) / ED Diagnoses Final diagnoses:  Left arm weakness  Left leg weakness  Left sided numbness     Clinical Impression: 1. Left arm weakness   2. Left leg weakness   3. Left sided numbness     Disposition: Care transferred to oncoming team to wait for MRI results and reassessment.  This note was prepared with assistance of Conservation officer, historic buildings. Occasional wrong-word or sound-a-like substitutions may have occurred due to the inherent limitations of voice recognition software.      Annalisse Minkoff, Marine Sia, MD 03/12/24 1606

## 2024-03-12 NOTE — ED Notes (Signed)
 NT called CCMD@1 :07pm

## 2024-03-12 NOTE — ED Notes (Signed)
 Patient transported to MRI

## 2024-03-12 NOTE — Assessment & Plan Note (Signed)
 Chronic pain, knees: Continue home lidocaine  patch PRN, gabapentin  100 mg twice daily Nonsustained VT: Continue home amiodarone  200 mg daily HFrEF, ischemic cardiomyopathy: Continue home digoxin  0.125 daily; hold Jardiance  given AKI; holding Entresto , spironolactone , torsemide  given soft pressures, restart as indicated. HLD: Continue home Zetia  10 mg daily GERD: Continue pantoprazole  40 mg daily T2DM: sSSI ordered; holding home metformin 

## 2024-03-12 NOTE — ED Provider Notes (Signed)
 Pt with general weakness, lightheadedness/dizziness, esp with standing.    Hx cardiomyopathy, ef 20-25%.   Mild aki on labs. Bp is low/soft. LR bolus.   Neurology has seen - indicates mri neg, no new focal neuro deficits.   Given near syncope, low bp, will consult medicine for admission.      Guadalupe Lee, MD 03/12/24 [phone]

## 2024-03-12 NOTE — H&P (Cosign Needed Addendum)
 Hospital Admission History and Physical Service Pager: [phone]  Patient name: Daniel Li Medical record number: [id number] Date of Birth: 04-Feb-1956 Age: 68 y.o. Gender: male  Primary Care Provider: Orlena Bitters, MD Consultants: Neurology Code Status: DNR, no interventions  Preferred Emergency Contact:  Contact Information     Name Relation Home Work Mobile   KERNODLE,COKY [place]) Belvia Boyer [phone]     Bermingham,Patricia Sister [phone]     Kandi Oris   [phone]      Other Contacts   None on File      Chief Complaint: dizziness, pre-syncope  Assessment and Plan: Daniel Li is a 68 y.o. male presenting with dizziness, pre-syncopal event. Differential for presentation of this includes:   - Stroke unlikely given negative workup thus far; Neurology consulted and think this is unlikely. - ACS possible, though workup thus far unremarkable. Has had recent Echo but may consider repeat. - Suspicious for complex migraine given patient's symptoms, particularly his sensitivity to light and blurred vision, with his history of headaches. - Orthostatic/vasovagal possible given possible decrease PO recently, though pt history is somewhat unclear. Does have mild AKI which does increase suspicion for dehydration.  Assessment & Plan Near syncope  Dizziness Pt with dizziness, nonspecific weakness; on further conversation much of his left-sided weakness/numbness appears to be baseline deficit from prior stroke. His main concern today is his dizziness, light-sensitivity, blurry vision. Neurology consulted and not evidence of stroke. He does have history of HA, possibly migraines, and complex migraine is certainly a possible explanation for his symptoms. He is not on any migraine therapy currently. Concern remains for possible cardiac etiology; last Echo in March 2025 with EF 20-25%.  ICD was interrogated at electrophysiology appointment this morning by: This  showed normal device function without any detected events. - Admit to FMTS, attending Dr. McDiarmid - Med tele, Vital signs per floor - orthostatic vitals - CCM x24hrs - PT/OT to treat - AM CBC, BMP, TSH, B12, lipid panel - encourage good PO intake - Fall precautions - Consider repeat echo, though will not order now given his very recent echo approximately 6 weeks ago - consider treatment with triptan AKI (acute kidney injury) (HCC) Mild - possibly just dehydration/volume depletion contributing to symptomology.  Baseline creatinine appears to be around 1.0; creatinine on admission 1.28.  S/p 1 L LR bolus in the ED. -Avoid nephrotoxic agents -Holding home spironolactone , torsemide , jardiance  -Continue to encourage good PO - AM BMP Chronic health problem Chronic pain, knees: Continue home lidocaine  patch PRN, gabapentin  100 mg twice daily Nonsustained VT: Continue home amiodarone  200 mg daily HFrEF, ischemic cardiomyopathy: Continue home digoxin  0.125 daily; hold Jardiance  given AKI; holding Entresto , spironolactone , torsemide  given soft pressures, restart as indicated. HLD: Continue home Zetia  10 mg daily GERD: Continue pantoprazole  40 mg daily T2DM: sSSI ordered; holding home metformin    FEN/GI: Carb modified/heart healthy diet VTE Prophylaxis: Home eliquis  5 mg BID  Disposition: med tele  History of Present Illness:  [name] is a 68 y.o. male presenting from electrophysiology outpatient appointment with surgeon for new onset left-sided weakness and numbness; last normal approximately 10 AM this morning.   On further discussion with patient woke up with dizziness, and this progressed while he was eating breakfast this morning.  He is also been feeling tired.  He did attend his electrophysiology appointment this morning at which time his physician was concerned and sent him to the ED.  ICD was interrogated at this  appointment, without any significant findings.  Patient  describes his feeling like someone was pushing his head down, room was spinning.  He does wear glasses, but notes his vision is blurrier than normal (not wearing glasses during admission encounter).  He is also seeing floating dots in his vision.  He is very sensitive to light right now.  Denies recent illnesses, no fevers.  He does report he was "sick" on Mother's Day this past week; he reports he was given the wrong medications at his facility and that is what caused his sickness.  He does report a history of headaches (possibly migraines per patient) 3-4 times a year, usually on one side of his head.  He has never been treated for migraines, takes Tylenol  when he needs to.  He also has chronic left-sided deficits (weakness, numbness) from prior stroke.  He clarifies that his current symptoms are no different from normal.  In the ED, patient does present with numbness and weakness to left arm and leg, as well as numbness to his left face.  Imaging workup included CT head which showed stable chronic bilateral MCA infarcts, but no acute hemorrhage; CTA head and neck without evidence of large vessel occlusion, though there is noted to be severe bilateral ICA stenosis 2/2 calcified plaque, as well as moderate stenosis of both vertebral arteries; MRI brain without acute abnormality.  Labs grossly unremarkable.  Neurology was consulted, and on further consideration of imaging workup as above, they do not believe this is an acute stroke.  Suspect dizziness may be peripheral and/or related to a cardiac etiology.  Patient admitted for further workup.  Review Of Systems: Per HPI.  Pertinent Past Medical History: Cardiomyopathy CHF CAD HTN HLD Stroke T2DM Remainder reviewed in history tab.   Pertinent Past Surgical History: Multiple heart caths Hernia repair Skin graft Exploratory laparoscopy (1997)  Remainder reviewed in history tab.   Pertinent Social History: Tobacco use: Used to smoke 1/2  PPD, now taking 1 cigarette a day.  Alcohol use: 6 months sober Other Substance use: None Lives with Care facility   Pertinent Family History: Mother-stroke, heart attack Father-heart attack Sister-stroke, heart attack Brother-stroke, heart attack Remainder reviewed in history tab.   Important Outpatient Medications: Tylenol  PRN Amiodarone  200 mg daily Digoxin  0.125 mg daily Entresto  24-26 mg twice daily Zetia  10 mg daily Nitroglycerin  as needed Spironolactone  25 mg daily Torsemide  20 mg daily Jardiance  10 mg daily Metformin  500 mg daily Protonix  40 mg daily Eliquis  5 mg twice daily Gabapentin  100 mg twice daily Klor-Con  20 mEq daily Lidocaine  patches as needed Remainder reviewed in medication history.   Objective: BP 91/61   Pulse 75   Temp 97.7 F (36.5 C) (Temporal)   Resp 15   Ht 5\' 6"  (1.676 m)   Wt 68 kg   SpO2 93%   BMI 24.21 kg/m  Exam: General: chronically ill-appearing, very pleasant, no acute distress. HEENT: normocephalic, PERRLA, EOM intact. MMM. Cardio: Regular rate, regular rhythm, no murmurs on exam. Pulm: Clear, no wheezing, no crackles. No increased work of breathing on room air. Abdominal: bowel sounds present, soft, non-tender, non-distended. Extremities: no peripheral edema.  Neuro: CN II: PERRL CN III, IV,VI: EOMI CV V: Normal sensation in V1, V2, V3 CVII: Symmetric smile and brow raise CN VIII: Normal hearing CN IX,X: Symmetric palate raise  CN XII: Symmetric tongue protrusion  UE and LE strength 5/5 on the Right; left-sided strength 3-4/5 Normal sensation in RUE and RLE; reports decreased sensation on  the left  Psych:  Cognition and judgment appear intact. Alert, communicative, and cooperative with normal attention span and concentration.  Labs:  CBC BMET  Recent Labs  Lab 03/12/24 1220 03/12/24 1223  WBC 6.3  --   HGB 14.0 16.0  HCT 44.4 47.0  PLT 271  --    Recent Labs  Lab 03/12/24 1220 03/12/24 1223  NA 137 136   K 4.2 4.6  CL 99 98  CO2 28  --   BUN 16 22  CREATININE 1.28* 1.30*  GLUCOSE 109* 102*  CALCIUM  8.8*  --      Ethanol negative PT/INR 19; 1.6 APTT 30 UDS negative  EKG: Sinus rhythm, P waves present, normal PR interval, no widening of QRS, very mild QTc prolongation.   5/15 CT Head: IMPRESSION: 1. Stable non contrast CT appearance of chronic bilateral MCA infarcts, asymmetric white matter disease. 2. ASPECTS 10. No acute cortically based infarct or acute intracranial hemorrhage identified.  5/15 CTA Head/Neck: IMPRESSION: 1. CTA is negative for large vessel occlusion, but Positive for SEVERE BILATERAL supraclinoid ICA stenosis due to bulky calcified plaque. 2. CTP: Suspect artifact in the chronic Right MCA infarct. No oligemia detected. 3. Furthermore, up to Moderate stenosis of both vertebral artery origins, greater on the left. 4.  Aortic Atherosclerosis (ICD10-I70.0).  5/15 MRI Brain: IMPRESSION: 1. No evidence of an acute intracranial abnormality. 2. Parenchymal atrophy, chronic small vessel ischemic disease and chronic infarcts, as described.  Omar Bibber, DO 03/12/2024, 9:41 PM PGY-1, [facility]  FPTS Intern pager: [phone], text pages welcome Secure chat group [facility] Teaching Service

## 2024-03-12 NOTE — Progress Notes (Signed)
  Device system confirmed to be MRI conditional, with implant date > 6 weeks ago, and no evidence of abandoned or epicardial leads in review of most recent CXR  Device last cleared by EP Provider: Levonne Rear 03/12/24  (Name and date)  Clearance is good through for 1 year as long as parameters remain stable at time of check. If pt undergoes a cardiac device procedure during that time, they should be re-cleared.   Tachy-therapies to be programmed off if applicable with device back to pre-MRI settings after completion of exam.  [employer] will be present for programming for the MRI.   Quentin Brunner, [place]  03/12/2024 4:34 PM

## 2024-03-12 NOTE — ED Triage Notes (Signed)
 Pt came as code stroke. LSN was 1000. Pt was on the way to appt and noticed left sided weakness and slurred speech. BP 90/60
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
## 2024-03-12 NOTE — Assessment & Plan Note (Addendum)
 Pt with dizziness, nonspecific weakness; on further conversation much of his left-sided weakness/numbness appears to be baseline deficit from prior stroke. His main concern today is his dizziness, light-sensitivity, blurry vision. Neurology consulted and not evidence of stroke. He does have history of HA, possibly migraines, and complex migraine is certainly a possible explanation for his symptoms. He is not on any migraine therapy currently. Concern remains for possible cardiac etiology; last Echo in March 2025 with EF 20-25%.  ICD was interrogated at electrophysiology appointment this morning by: This showed normal device function without any detected events. - Admit to FMTS, attending Dr. McDiarmid - Med tele, Vital signs per floor - orthostatic vitals - CCM x24hrs - PT/OT to treat - AM CBC, BMP, TSH, B12, lipid panel - encourage good PO intake - Fall precautions - Consider repeat echo, though will not order now given his very recent echo approximately 6 weeks ago - consider treatment with triptan

## 2024-03-12 NOTE — Hospital Course (Addendum)
 Daniel Li is a 68 y.o.male with a history of HFrEF, ischemic cardiomyopathy, T2DM, GERD, chronic pain, nonsustained VT, HDL who was admitted to the [facility] Teaching Service at [facility] for acute on chronic dizziness.   His hospital course is detailed below:  Near syncope  Dizziness Daniel Li presented to the ED from his outpatient electrophysiology appointment this morning for concerns of dizziness/weakness/left-sided deficits.  Imaging workup including CT head, CTA head and neck, MRI brain without acute abnormality or evidence of large vessel occlusion.  Labs grossly unremarkable.  Neurology was consulted and did not believe this to be an acute stroke.  On further conversation with the Daniel Li it was elicited that the left-sided deficits (weakness, numbness) are residual from prior stroke and on presentation are unchanged from his baseline.  He did have new dizziness and blurring of his vision.  Had vestibular evaluation with PT with mild improvement of symptoms.  Orthostatic vital signs remained appropriate throughout mission.  Daniel Li's TSH was elevated to 11.6, and T3 was low but free T4 was high.  Daniel Li was initiated on 100 mcg of levothyroxine  per weight-based dosing protocols.  Recommendations for outpatient follow-up below.  AKI Creatinine 1.28 on admission.  Daniel Li received 1 L LR fluid bolus in the ED. Cr improved after fluids were given.  Bilateral ICA stenosis Noted on CTA head and neck on admission.  Daniel Li underwent evaluation with interventional radiology which showed no significant blockage or stenosis.  Daniel Li was continued on apixaban  for anticoagulation and Zetia  for hyperlipidemia, however given Daniel Li's untreated hypothyroidism as noted above it would be reasonable to retry statin therapy once TSH has normalized.   Other chronic conditions were medically managed with home medications and formulary alternatives as necessary (HFrEF, HLD, GERD, T2DM, Nonsustained  VT, chronic knee pain)     PCP Follow-up Recommendations: Follow up TSH in 4 weeks Zetia  could be a possible culprit in dizziness; consider switch to alternate medication Consider re-trial of statin therapy once hypothyroidism is corrected.

## 2024-03-13 DIAGNOSIS — I6930 Unspecified sequelae of cerebral infarction: Secondary | ICD-10-CM

## 2024-03-13 DIAGNOSIS — E039 Hypothyroidism, unspecified: Secondary | ICD-10-CM | POA: Insufficient documentation

## 2024-03-13 DIAGNOSIS — I6529 Occlusion and stenosis of unspecified carotid artery: Secondary | ICD-10-CM | POA: Insufficient documentation

## 2024-03-13 DIAGNOSIS — N179 Acute kidney failure, unspecified: Secondary | ICD-10-CM

## 2024-03-13 DIAGNOSIS — R55 Syncope and collapse: Secondary | ICD-10-CM | POA: Diagnosis not present

## 2024-03-13 DIAGNOSIS — R42 Dizziness and giddiness: Secondary | ICD-10-CM | POA: Diagnosis not present

## 2024-03-13 DIAGNOSIS — E860 Dehydration: Secondary | ICD-10-CM

## 2024-03-13 LAB — CBC
HCT: 41.6 % (ref 39.0–52.0)
Hemoglobin: 13.5 g/dL (ref 13.0–17.0)
MCH: 26.4 pg (ref 26.0–34.0)
MCHC: 32.5 g/dL (ref 30.0–36.0)
MCV: 81.3 fL (ref 80.0–100.0)
Platelets: 242 10*3/uL (ref 150–400)
RBC: 5.12 MIL/uL (ref 4.22–5.81)
RDW: 16.8 % — ABNORMAL HIGH (ref 11.5–15.5)
WBC: 5.5 10*3/uL (ref 4.0–10.5)
nRBC: 0 % (ref 0.0–0.2)

## 2024-03-13 LAB — BASIC METABOLIC PANEL WITH GFR
Anion gap: 8 (ref 5–15)
BUN: 12 mg/dL (ref 8–23)
CO2: 29 mmol/L (ref 22–32)
Calcium: 8.9 mg/dL (ref 8.9–10.3)
Chloride: 100 mmol/L (ref 98–111)
Creatinine, Ser: 1.23 mg/dL (ref 0.61–1.24)
GFR, Estimated: >60 mL/min (ref >60)
Glucose, Bld: 90 mg/dL (ref 70–99)
Potassium: 3.9 mmol/L (ref 3.5–5.1)
Sodium: 137 mmol/L (ref 135–145)

## 2024-03-13 LAB — T4, FREE: Free T4: 0.79 ng/dL (ref 0.61–1.12)

## 2024-03-13 LAB — LIPID PANEL
Cholesterol: 149 mg/dL (ref 0–200)
HDL: 75 mg/dL (ref >40)
LDL Cholesterol: 44 mg/dL (ref 0–99)
Total CHOL/HDL Ratio: 2 ratio
Triglycerides: 149 mg/dL (ref <150)
VLDL: 30 mg/dL (ref 0–40)

## 2024-03-13 LAB — TSH: TSH: 11.6 u[IU]/mL — ABNORMAL HIGH (ref 0.350–4.500)

## 2024-03-13 LAB — VITAMIN B12: Vitamin B-12: 260 pg/mL (ref 180–914)

## 2024-03-13 LAB — MAGNESIUM: Magnesium: 2 mg/dL (ref 1.7–2.4)

## 2024-03-13 LAB — GLUCOSE, CAPILLARY
Glucose-Capillary: 122 mg/dL — ABNORMAL HIGH (ref 70–99)
Glucose-Capillary: 169 mg/dL — ABNORMAL HIGH (ref 70–99)

## 2024-03-13 LAB — CBG MONITORING, ED
Glucose-Capillary: 186 mg/dL — ABNORMAL HIGH (ref 70–99)
Glucose-Capillary: 102 mg/dL — ABNORMAL HIGH (ref 70–99)

## 2024-03-13 MED ORDER — LEVOTHYROXINE SODIUM 100 MCG PO TABS
100.0000 ug | ORAL_TABLET | Freq: Every day | ORAL | Status: DC
  Administered 2024-03-13 – 2024-03-17 (×5): 100 ug via ORAL
  Filled 2024-03-13 (×5): qty 1

## 2024-03-13 MED ORDER — HEPARIN SODIUM (PORCINE) 5000 UNIT/ML IJ SOLN
5000.0000 [IU] | Freq: Three times a day (TID) | INTRAMUSCULAR | Status: DC
  Administered 2024-03-14: 5000 [IU] via SUBCUTANEOUS
  Filled 2024-03-13: qty 1

## 2024-03-13 MED ORDER — POTASSIUM CHLORIDE CRYS ER 20 MEQ PO TBCR
20.0000 meq | EXTENDED_RELEASE_TABLET | Freq: Once | ORAL | Status: AC
  Administered 2024-03-13: 20 meq via ORAL
  Filled 2024-03-13: qty 1

## 2024-03-13 NOTE — Evaluation (Signed)
 Occupational Therapy Evaluation Patient Details Name: Daniel Li MRN: [id number] DOB: 11-22-1955 Today's Date: 03/13/2024   History of Present Illness   68 y.o. male with hx of of combined CHF, hyperlipidemia, PE on Eliquis , hypertension, hyperlipidemia, prior strokes with baseline left-sided weakness.  CT imaging: bilateral MCA infarcts.  Adm 5/15 from the cardiology office for evaluation of left-sided weakness and dizziness.     Clinical Impressions Patient admitted for the diagnosis above.  Patient stating L sided weakness and vision deficits are close to his normal baseline.  Patient able to get up and walk to the bathroom with supervision, Min A for lower body ADL.  Patient plans on discharging back to his ALF when medically cleared.  No significant OT needs identified.  No post acute OT anticipated.       If plan is discharge home, recommend the following:   Assist for transportation;A little help with walking and/or transfers;A little help with bathing/dressing/bathroom     Functional Status Assessment   Patient has not had a recent decline in their functional status     Equipment Recommendations   None recommended by OT     Recommendations for Other Services         Precautions/Restrictions   Precautions Precautions: Fall Restrictions Weight Bearing Restrictions Per Provider Order: No     Mobility Bed Mobility Overal bed mobility: Modified Independent                  Transfers Overall transfer level: Needs assistance Equipment used: Rolling walker (2 wheels) Transfers: Sit to/from Stand, Bed to chair/wheelchair/BSC Sit to Stand: Supervision     Step pivot transfers: Supervision            Balance Overall balance assessment: Mild deficits observed, not formally tested                                         ADL either performed or assessed with clinical judgement   ADL Overall ADL's : At baseline                                        General ADL Comments: MinA for LB ADL, generalized supervision for toileting     Vision Ability to See in Adequate Light: 1 Impaired Patient Visual Report: Blurring of vision Additional Comments: patient stating blurred vision is close to baseline     Perception Perception: Within Functional Limits       Praxis Praxis: [facility]       Pertinent Vitals/Pain Pain Assessment Pain Assessment: No/denies pain     Extremity/Trunk Assessment Upper Extremity Assessment Upper Extremity Assessment: Right hand dominant;LUE deficits/detail LUE Deficits / Details: baseline weakness, able to use LUE functionally LUE Sensation: decreased light touch LUE Coordination: decreased fine motor   Lower Extremity Assessment Lower Extremity Assessment: Defer to PT evaluation   Cervical / Trunk Assessment Cervical / Trunk Assessment: Normal   Communication Communication Communication: Impaired Factors Affecting Communication: Reduced clarity of speech   Cognition Arousal: Alert Behavior During Therapy: WFL for tasks assessed/performed Cognition: No apparent impairments                               Following commands: Intact  Cueing  General Comments   Cueing Techniques: Verbal cues   VSS on RA   Exercises     Shoulder Instructions      Home Living Family/patient expects to be discharged to:: Assisted living                             Home Equipment: Rolling Walker (2 wheels);Cane - quad;Cane - single point;BSC/3in1;Shower seat;Grab bars - toilet;Grab bars - tub/shower;Hand held shower head          Prior Functioning/Environment Prior Level of Function : Needs assist             Mobility Comments: Patient stating he uses quad cane at his facility, but has access to RW if needed. ADLs Comments: PRN assist with ADL, walks to the dinning room, assist with meds.    OT Problem List: Impaired  balance (sitting and/or standing);Impaired vision/perception   OT Treatment/Interventions:        OT Goals(Current goals can be found in the care plan section)   Acute Rehab OT Goals Patient Stated Goal: Return ALF OT Goal Formulation: With patient Time For Goal Achievement: 03/20/24 Potential to Achieve Goals: Good   OT Frequency:       Co-evaluation              AM-PAC OT "6 Clicks" Daily Activity     Outcome Measure Help from another person eating meals?: None Help from another person taking care of personal grooming?: A Little Help from another person toileting, which includes using toliet, bedpan, or urinal?: A Little Help from another person bathing (including washing, rinsing, drying)?: A Little Help from another person to put on and taking off regular upper body clothing?: None Help from another person to put on and taking off regular lower body clothing?: A Little 6 Click Score: 20   End of Session Equipment Utilized During Treatment: Rolling walker (2 wheels) Nurse Communication: Mobility status  Activity Tolerance: Patient tolerated treatment well Patient left: in bed;with call bell/phone within reach  OT Visit Diagnosis: Unsteadiness on feet (R26.81)                Time: 1610-9604 OT Time Calculation (min): 20 min Charges:  OT General Charges $OT Visit: 1 Visit OT Evaluation $OT Eval Moderate Complexity: 1 Mod  03/13/2024  RP, OTR/L  Acute Rehabilitation Services  Office:  [phone]   Benjamen Brand 03/13/2024, 9:30 AM

## 2024-03-13 NOTE — Progress Notes (Signed)
 Daily Progress Note Intern Pager: [phone]  Patient name: Daniel Li Medical record number: [id number] Date of birth: [birth year] Age: 68 y.o. Gender: male  Primary Care Provider: Orlena Bitters, MD Consultants: none Code Status: DNR  Pt Overview and Major Events to Date:  5/15: Admitted  Assessment and Plan:  Patient is a 67 year old male presenting from outside office with concern for stroke-like symptoms later found to be his baseline neurologic function and near syncope with hypotension.  PMH includes type 2 diabetes, HFrEF with an EF of 20%, PE on Eliquis , history of CVA with left-sided weakness, CAD status post PCI with ICD and AAA. Assessment & Plan Near syncope  Dizziness Patient continues to have dizziness with positional changes, but orthostatic vitals were negative. CT did show severe stenosis of bilateral supraclinoid ICA as well as moderate stenosis of both vertebral artery origins. Will consult vascular today for further evaluation.  - CCM x24hrs - PT/OT to treat  -request vestibular dysfunction testing - encourage good PO intake - Fall precautions -vascular surgery consult -currently on zetia  10 mg, consider alternate therapy Hypothyroidism Noted to have TSH of 11.6 on lab workup, will initiate levothyroxine therapy  -1.6 mcg/kg/dose = 108 mcg, round down to 100 mcg per day -follow up TSH in 4-6 weeks -Free T3, T4 AKI (acute kidney injury) (HCC) CR normal on BMP this AM, 1.23.  -Avoid nephrotoxic agents until tomorrow -Holding home spironolactone , torsemide , jardiance  -Continue to encourage good PO - AM BMP Chronic health problem Chronic pain, knees: Continue home lidocaine  patch PRN, gabapentin  100 mg twice daily Nonsustained VT: Continue home amiodarone  200 mg daily HFrEF, ischemic cardiomyopathy: Continue home digoxin  0.125 daily; hold Jardiance  given AKI; holding Entresto , spironolactone , torsemide  given soft pressures, restart as  indicated. HLD: Continue home Zetia  10 mg daily GERD: Continue pantoprazole  40 mg daily T2DM: sSSI ordered; holding home metformin   FEN/GI: heart healthy PPx: home eliquis  Dispo:Pending PT recommendations  pending clinical improvement .   Subjective:  Patient reports he is feeling well, but continues to have dizziness with positional changes.   Objective: Temp:  [97.4 F (36.3 C)-98.2 F (36.8 C)] 97.9 F (36.6 C) (05/16 0310) Pulse Rate:  [55-78] 58 (05/16 0630) Resp:  [13-18] 16 (05/16 0630) BP: (81-115)/(52-71) 91/66 (05/16 0630) SpO2:  [93 %-100 %] 97 % (05/16 0630) Weight:  [67.6 kg-68 kg] 68 kg (05/15 1246) Physical Exam: General: Elderly appearing, no distress Cardiovascular: RRR, no m/r/g Respiratory: CTAB, no increased WOB Abdomen: flat, soft, nontender Extremities: nttp, no peripheral edema  Laboratory: Most recent CBC Lab Results  Component Value Date   WBC 5.5 03/13/2024   HGB 13.5 03/13/2024   HCT 41.6 03/13/2024   MCV 81.3 03/13/2024   PLT 242 03/13/2024   Most recent BMP    Latest Ref Rng & Units 03/13/2024    5:29 AM  BMP  Glucose 70 - 99 mg/dL 90   BUN 8 - 23 mg/dL 12   Creatinine 1.30 - 1.24 mg/dL 8.65   Sodium 784 - 696 mmol/L 137   Potassium 3.5 - 5.1 mmol/L 3.9   Chloride 98 - 111 mmol/L 100   CO2 22 - 32 mmol/L 29   Calcium  8.9 - 10.3 mg/dL 8.9      Latest Reference Range & Units 03/13/24 05:29  TSH 0.350 - 4.500 uIU/mL 11.600 (H)    CTA Head and Neck w/wo Contrast IMPRESSION: 1. CTA is negative for large vessel occlusion, but Positive for SEVERE BILATERAL supraclinoid  ICA stenosis due to bulky calcified plaque.   2. CTP: Suspect artifact in the chronic Right MCA infarct. No oligemia detected.   3. Furthermore, up to Moderate stenosis of both vertebral artery origins, greater on the left.   4.  Aortic Atherosclerosis (ICD10-I70.0).   #1, #2 were communicated to Dr. Bonnita Buttner at 1256 hours on 03/12/2024 by text page via the [facility]  messaging system.   Rayma Calandra, DO 03/13/2024, 7:25 AM  PGY-1, [facility] FPTS Intern pager: [phone], text pages welcome Secure chat group [facility]

## 2024-03-13 NOTE — Assessment & Plan Note (Addendum)
 Resolved yesterday, and creatinine continues to downtrend this morning, 1.08. - Restart home Jardiance  10 mg daily - Continue holding home spironolactone , torsemide  for normotensive to softer pressures - Continue to encourage good PO
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
## 2024-03-13 NOTE — Assessment & Plan Note (Signed)
 Initially seen on 5/15 CTA head and neck.  Possibly contributing to patient's presenting symptomology.  Interventional radiology consulted and plan for diagnostic cerebral arteriogram on Monday 5/19. - Patient transitioned from Eliquis  to heparin  in anticipation of procedure - Heparin  will be held x1 prior to procedure

## 2024-03-13 NOTE — Assessment & Plan Note (Addendum)
 CR normal on BMP this AM, 1.23.  -Avoid nephrotoxic agents until tomorrow -Holding home spironolactone , torsemide , jardiance  -Continue to encourage good PO - AM BMP

## 2024-03-13 NOTE — Assessment & Plan Note (Addendum)
 Doing well this morning.  Reports minimal dizziness, primarily with positional changes.  This is improving since his arrival.  IR consulted for bilateral vertebral artery stenosis as below. - PT/OT to treat  -request vestibular dysfunction testing - encourage good PO intake - Fall precautions - currently on zetia  10 mg, consider alternate therapy

## 2024-03-13 NOTE — Assessment & Plan Note (Addendum)
 Chronic pain, knees: Continue home lidocaine  patch PRN, gabapentin  100 mg twice daily Nonsustained VT: Continue home amiodarone  200 mg daily HFrEF, ischemic cardiomyopathy: Continue home digoxin  0.125 daily; hold Jardiance  given AKI; holding Entresto , spironolactone , torsemide  given soft pressures, restart as indicated. HLD: Continue home Zetia  10 mg daily GERD: Continue pantoprazole  40 mg daily T2DM: sSSI ordered; holding home metformin  HTN: Continue holding home spironolactone , torsemide  for normotensive pressures; restart as indicated

## 2024-03-13 NOTE — ED Notes (Signed)
 RN Cathleen Coach notified about pt NBP.

## 2024-03-13 NOTE — Assessment & Plan Note (Signed)
 Chronic pain, knees: Continue home lidocaine  patch PRN, gabapentin  100 mg twice daily Nonsustained VT: Continue home amiodarone  200 mg daily HFrEF, ischemic cardiomyopathy: Continue home digoxin  0.125 daily; hold Jardiance  given AKI; holding Entresto , spironolactone , torsemide  given soft pressures, restart as indicated. HLD: Continue home Zetia  10 mg daily GERD: Continue pantoprazole  40 mg daily T2DM: sSSI ordered; holding home metformin 

## 2024-03-13 NOTE — Assessment & Plan Note (Addendum)
 Patient continues to have dizziness with positional changes, but orthostatic vitals were negative. CT did show severe stenosis of bilateral supraclinoid ICA as well as moderate stenosis of both vertebral artery origins. Will consult vascular today for further evaluation.  - CCM x24hrs - PT/OT to treat  -request vestibular dysfunction testing - encourage good PO intake - Fall precautions -vascular surgery consult -currently on zetia  10 mg, consider alternate therapy

## 2024-03-13 NOTE — Assessment & Plan Note (Addendum)
 TSH of 11.6 on arrival lab workup; subsequent T4 WNL at 0.79.  T3 in process.  Initiated on levothyroxine  100 mcg yesterday 5/16.  - Continue levothyroxine  100 mcg - Follow up T3 - follow up TSH in 4-6 weeks

## 2024-03-13 NOTE — Evaluation (Signed)
 Physical Therapy Evaluation Patient Details Name: Daniel Li MRN: [id number] DOB: 1956/08/31 Today's Date: 03/13/2024  History of Present Illness  68 y.o. male admitted 5/15 with left sided weakness and dizziness. 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  Working diagnosis is hypotensive exacerbation of ischemia in prior right anterior cerebral infarctions or chronic residual deficits from prior strokes.                                  PMH: combined CHF, hyperlipidemia, PE on Eliquis , hypertension, hyperlipidemia, prior strokes with baseline left-sided weakness.  Clinical Impression  Pt admitted with above diagnosis. Pt appeared to have right posterior canal BPPV and treated with Epley maneuver. Pt felt nauseous after treatment therefore did not assess gait today (OT saw pt earlier and stated pt close to baseline). Will continue to follow acutely and recommend HHPT vestibular rehab at [facility] living.  Pt currently with functional limitations due to the deficits listed below (see PT Problem List). Pt will benefit from acute skilled PT to increase their independence and safety with mobility to allow discharge.           If plan is discharge home, recommend the following: Assistance with cooking/housework;Assist for transportation;Help with stairs or ramp for entrance   Can travel by private vehicle        Equipment Recommendations None recommended by PT  Recommendations for Other Services       Functional Status Assessment Patient has had a recent decline in their functional status and demonstrates the ability to make significant improvements in function in a reasonable and predictable amount of time.     Precautions / Restrictions Precautions Precautions: Fall Restrictions Weight Bearing Restrictions Per Provider Order: No      Mobility  Bed Mobility Overal bed mobility: Modified Independent             General bed mobility  comments: Pt positive for right posterior canal BPPV therefore treated with Epley manuever.  Pt felt sick aafter treatment therefore deferred further OOB activity.  Settled pt back into bed.    Transfers                   General transfer comment: TBA    Ambulation/Gait               General Gait Details: TBA  Stairs            Wheelchair Mobility     Tilt Bed    Modified Rankin (Stroke Patients Only)       Balance                                             Pertinent Vitals/Pain Pain Assessment Pain Assessment: No/denies pain    Home Living Family/patient expects to be discharged to:: Assisted living                 Home Equipment: Agricultural consultant (2 wheels);Cane - quad;Cane - single point;BSC/3in1;Shower seat;Grab bars - toilet;Grab bars - tub/shower;Hand held shower head      Prior Function Prior Level of Function : Needs assist             Mobility Comments: Patient stating he uses quad cane at his facility, but has access to RW if  needed. ADLs Comments: PRN assist with ADL, walks to the dinning room, assist with meds.     Extremity/Trunk Assessment   Upper Extremity Assessment Upper Extremity Assessment: Defer to OT evaluation LUE Deficits / Details: baseline weakness, able to use LUE functionally LUE Sensation: decreased light touch LUE Coordination: decreased fine motor    Lower Extremity Assessment Lower Extremity Assessment: LLE deficits/detail LLE Deficits / Details: grossly 3/5 LLE Coordination: decreased gross motor    Cervical / Trunk Assessment Cervical / Trunk Assessment: Normal  Communication   Communication Communication: Impaired Factors Affecting Communication: Reduced clarity of speech    Cognition Arousal: Alert Behavior During Therapy: WFL for tasks assessed/performed   PT - Cognitive impairments: No apparent impairments                         Following commands:  Intact       Cueing Cueing Techniques: Verbal cues     General Comments General comments (skin integrity, edema, etc.): VSS with BP sitting 106/75.  Could not take further vitals as pt feeling poorly after vestibular assessment completed.    Exercises     Assessment/Plan    PT Assessment Patient needs continued PT services  PT Problem List Decreased mobility;Decreased coordination;Decreased balance;Decreased activity tolerance;Decreased safety awareness       PT Treatment Interventions DME instruction;Functional mobility training;Therapeutic activities;Therapeutic exercise;Gait training;Balance training;Patient/family education    PT Goals (Current goals can be found in the Care Plan section)  Acute Rehab PT Goals Patient Stated Goal: to go home PT Goal Formulation: With patient Time For Goal Achievement: 03/27/24 Potential to Achieve Goals: Good    Frequency Min 2X/week     Co-evaluation               AM-PAC PT "6 Clicks" Mobility  Outcome Measure Help needed turning from your back to your side while in a flat bed without using bedrails?: None Help needed moving from lying on your back to sitting on the side of a flat bed without using bedrails?: None Help needed moving to and from a bed to a chair (including a wheelchair)?: A Little Help needed standing up from a chair using your arms (e.g., wheelchair or bedside chair)?: A Little Help needed to walk in hospital room?: A Little Help needed climbing 3-5 steps with a railing? : Total 6 Click Score: 18    End of Session   Activity Tolerance: Patient limited by fatigue (limited by dizziness and nausea) Patient left: in bed;with call bell/phone within reach;with bed alarm set Nurse Communication: Mobility status PT Visit Diagnosis: Dizziness and giddiness (R42);BPPV;Unsteadiness on feet (R26.81);Muscle weakness (generalized) (M62.81) BPPV - Right/Left : Right    Time: 1441-1500 PT Time Calculation (min) (ACUTE  ONLY): 19 min   Charges:   PT Evaluation $PT Eval Moderate Complexity: 1 Mod   PT General Charges $$ ACUTE PT VISIT: 1 Visit         Berkley Cronkright M,PT Acute Rehab Services [phone]   Florencia Hunter 03/13/2024, 3:53 PM

## 2024-03-13 NOTE — Plan of Care (Signed)
 Called [facility] and they recommend reaching out to Neurointerventional Radiology. Consulted Dr. Alvira Josephs and they plan to eval pt.

## 2024-03-13 NOTE — Progress Notes (Signed)
 Daily Progress Note Intern Pager: [phone]  Patient name: Daniel Li Medical record number: [id number] Date of birth: [birth year] Age: 68 y.o. Gender: male  Primary Care Provider: Orlena Bitters, MD Consultants: Interventional radiology Code Status: DNR  Pt Overview and Major Events to Date:  5/15-admitted  Assessment and Plan: Daniel Li is a 68 y.o. male presented from outside office with concern for strokelike symptoms, later found to be at his baseline neurologic function; also presented with near syncope with hypotension.  PMH includes T2DM, HFrEF with an EF of 20%, PE on Eliquis , h/o CVA with residual left-sided deficits, CAD s/p PCI with ICD, AAA.  Stable this morning, and awaiting IR procedure for bilateral carotid stenosis. Assessment & Plan Near syncope  Dizziness Doing well this morning.  Reports minimal dizziness, primarily with positional changes.  This is improving since his arrival.  IR consulted for bilateral vertebral artery stenosis as below. - PT/OT to treat  -request vestibular dysfunction testing - encourage good PO intake - Fall precautions - currently on zetia  10 mg, consider alternate therapy Intracranial carotid stenosis, bilateral Initially seen on 5/15 CTA head and neck.  Possibly contributing to patient's presenting symptomology.  Interventional radiology consulted and plan for diagnostic cerebral arteriogram on Monday 5/19. - Patient transitioned from Eliquis  to heparin  in anticipation of procedure - Heparin  will be held x1 prior to procedure Anemia New this AM with Hgb 13.5 > 12.4.  Asymptomatic.All cell lines down fro yesterday, suspect this is dilutional. Patient was transitioned from home Eliquis  to subcutaneous heparin  yesterday in anticipation of upcoming IR procedure as above.  B12 obtained on arrival WNL. - AM CBC - consider iron panel if Hgb continues to worsen Hypothyroidism TSH of 11.6 on arrival lab workup; subsequent T4  WNL at 0.79.  T3 in process.  Initiated on levothyroxine  100 mcg yesterday 5/16.  - Continue levothyroxine  100 mcg - Follow up T3 - follow up TSH in 4-6 weeks AKI (acute kidney injury) (HCC) Resolved yesterday, and creatinine continues to downtrend this morning, 1.08. - Restart home Jardiance  10 mg daily - Continue holding home spironolactone , torsemide  for normotensive to softer pressures - Continue to encourage good PO Chronic health problem Chronic pain, knees: Continue home lidocaine  patch PRN, gabapentin  100 mg twice daily Nonsustained VT: Continue home amiodarone  200 mg daily HFrEF, ischemic cardiomyopathy: Continue home digoxin  0.125 daily; hold Jardiance  given AKI; holding Entresto , spironolactone , torsemide  given soft pressures, restart as indicated. HLD: Continue home Zetia  10 mg daily GERD: Continue pantoprazole  40 mg daily T2DM: sSSI ordered; holding home metformin  HTN: Continue holding home spironolactone , torsemide  for normotensive pressures; restart as indicated  FEN/GI: Carb modified diet PPx: Heparin  Dispo:Pending PT recommendations  pending clinical improvement .  Subjective:  Patient seen this morning sleeping in bed, and awakens easily to verbal stimuli.  Did have some dizziness yesterday evening, but improved.  Denies any complaints or concerns at this time.  Objective: Temp:  [97.8 F (36.6 C)-98.4 F (36.9 C)] 97.9 F (36.6 C) (05/17 0517) Pulse Rate:  [54-67] 54 (05/17 0517) Resp:  [16-18] 16 (05/17 0517) BP: (94-114)/(68-98) 94/71 (05/17 0517) SpO2:  [95 %-100 %] 95 % (05/17 0517) Physical Exam: General: Sleeping comfortably in bed, awakens appropriately to verbal stimuli and then goes back to sleep.  NAD. Cardiovascular: RRR, no murmurs Respiratory: CTA anteriorly, normal work of breathing on room air. Extremities: Skin is warm and dry.  Laboratory: Most recent CBC Lab Results  Component Value Date  WBC 4.9 03/14/2024   HGB 12.4 (L) 03/14/2024    HCT 38.1 (L) 03/14/2024   MCV 81.1 03/14/2024   PLT 223 03/14/2024   Most recent BMP    Latest Ref Rng & Units 03/14/2024    4:15 AM  BMP  Glucose 70 - 99 mg/dL 782   BUN 8 - 23 mg/dL 11   Creatinine 9.56 - 1.24 mg/dL 2.13   Sodium 086 - 578 mmol/L 137   Potassium 3.5 - 5.1 mmol/L 4.0   Chloride 98 - 111 mmol/L 104   CO2 22 - 32 mmol/L 27   Calcium  8.9 - 10.3 mg/dL 8.7    Magnesium  2.2  Omar Bibber, DO 03/14/2024, 6:39 AM  PGY-1, [facility] Family Medicine FPTS Intern pager: [phone], text pages welcome Secure chat group [facility] Teaching Service

## 2024-03-13 NOTE — Assessment & Plan Note (Addendum)
 Noted to have TSH of 11.6 on lab workup, will initiate levothyroxine therapy  -1.6 mcg/kg/dose = 108 mcg, round down to 100 mcg per day -follow up TSH in 4-6 weeks -Free T3, T4

## 2024-03-14 DIAGNOSIS — E861 Hypovolemia: Secondary | ICD-10-CM

## 2024-03-14 DIAGNOSIS — D649 Anemia, unspecified: Secondary | ICD-10-CM | POA: Insufficient documentation

## 2024-03-14 DIAGNOSIS — R55 Syncope and collapse: Secondary | ICD-10-CM | POA: Diagnosis not present

## 2024-03-14 LAB — CBC
HCT: 38.1 % — ABNORMAL LOW (ref 39.0–52.0)
Hemoglobin: 12.4 g/dL — ABNORMAL LOW (ref 13.0–17.0)
MCH: 26.4 pg (ref 26.0–34.0)
MCHC: 32.5 g/dL (ref 30.0–36.0)
MCV: 81.1 fL (ref 80.0–100.0)
Platelets: 223 10*3/uL (ref 150–400)
RBC: 4.7 MIL/uL (ref 4.22–5.81)
RDW: 16.5 % — ABNORMAL HIGH (ref 11.5–15.5)
WBC: 4.9 10*3/uL (ref 4.0–10.5)
nRBC: 0 % (ref 0.0–0.2)

## 2024-03-14 LAB — BASIC METABOLIC PANEL WITH GFR
Anion gap: 6 (ref 5–15)
BUN: 11 mg/dL (ref 8–23)
CO2: 27 mmol/L (ref 22–32)
Calcium: 8.7 mg/dL — ABNORMAL LOW (ref 8.9–10.3)
Chloride: 104 mmol/L (ref 98–111)
Creatinine, Ser: 1.08 mg/dL (ref 0.61–1.24)
GFR, Estimated: >60 mL/min (ref >60)
Glucose, Bld: 109 mg/dL — ABNORMAL HIGH (ref 70–99)
Potassium: 4 mmol/L (ref 3.5–5.1)
Sodium: 137 mmol/L (ref 135–145)

## 2024-03-14 LAB — T3: T3, Total: 68 ng/dL — ABNORMAL LOW (ref 71–180)

## 2024-03-14 LAB — GLUCOSE, CAPILLARY
Glucose-Capillary: 126 mg/dL — ABNORMAL HIGH (ref 70–99)
Glucose-Capillary: 90 mg/dL (ref 70–99)
Glucose-Capillary: 112 mg/dL — ABNORMAL HIGH (ref 70–99)
Glucose-Capillary: 166 mg/dL — ABNORMAL HIGH (ref 70–99)

## 2024-03-14 LAB — MAGNESIUM: Magnesium: 2.2 mg/dL (ref 1.7–2.4)

## 2024-03-14 MED ORDER — EMPAGLIFLOZIN 10 MG PO TABS
10.0000 mg | ORAL_TABLET | Freq: Every day | ORAL | Status: DC
  Administered 2024-03-14 – 2024-03-17 (×4): 10 mg via ORAL
  Filled 2024-03-14 (×4): qty 1

## 2024-03-14 NOTE — Progress Notes (Signed)
 Physical Therapy Treatment Patient Details Name: Daniel Li MRN: [id number] DOB: Feb 12, 1956 Today's Date: 03/14/2024   History of Present Illness 68 y.o. male admitted 5/15 with left sided weakness and dizziness. 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  Working diagnosis is hypotensive exacerbation of ischemia in prior right anterior cerebral infarctions or chronic residual deficits from prior strokes.                                  PMH: combined CHF, hyperlipidemia, PE on Eliquis , hypertension, hyperlipidemia, prior strokes with baseline left-sided weakness.    PT Comments  Complains of constant dizziness, worse with repositioning. Most symptomatic in Rt dix-hallpike position, difficult to appreciate any true torsional nystagmus, eyes wandering, possible slow upward vertical nystagmus. Treated with Epley's maneuver x2, showed some improvement in ability fixate gaze (less eye wandering) in each position after initial round, but still symptomatic. CGA for transfer and short distance gait in room. Assisted with set-up for urinal to stand and urinate. BP 107/78 in standing. Dizziness worse while standing stationary. Suspect at least some of these symptoms could be central. Will consider to follow and progress as tolerated. Patient will continue to benefit from skilled physical therapy services to further improve independence with functional mobility.    If plan is discharge home, recommend the following: Assistance with cooking/housework;Assist for transportation;Help with stairs or ramp for entrance   Can travel by private vehicle        Equipment Recommendations  None recommended by PT    Recommendations for Other Services       Precautions / Restrictions Precautions Precautions: Fall Restrictions Weight Bearing Restrictions Per Provider Order: No     Mobility  Bed Mobility Overal bed mobility: Modified Independent              General bed mobility comments: mod I with bed mobility.    Transfers Overall transfer level: Needs assistance Equipment used: Rolling walker (2 wheels), 1 person hand held assist Transfers: Sit to/from Stand, Bed to chair/wheelchair/BSC Sit to Stand: Supervision, Contact guard assist           General transfer comment: Performed with hand held support and RW x3. CGA initially progressed to supervision for safety. slow to rise.    Ambulation/Gait Ambulation/Gait assistance: Contact guard assist Gait Distance (Feet): 20 Feet Assistive device: Rolling walker (2 wheels) Gait Pattern/deviations: Step-through pattern, Decreased stride length Gait velocity: dec Gait velocity interpretation: <1.31 ft/sec, indicative of household ambulator   General Gait Details: Minor instability with RW, able to self correct, slow speed. Cues for safety and use.   Stairs             Wheelchair Mobility     Tilt Bed    Modified Rankin (Stroke Patients Only) Modified Rankin (Stroke Patients Only) Pre-Morbid Rankin Score: No significant disability Modified Rankin: Moderately severe disability     Balance Overall balance assessment: Mild deficits observed, not formally tested                                          Communication Communication Communication: Impaired Factors Affecting Communication: Reduced clarity of speech  Cognition Arousal: Alert Behavior During Therapy: WFL for tasks assessed/performed   PT - Cognitive impairments: No apparent impairments  Following commands: Intact      Cueing Cueing Techniques: Verbal cues  Exercises Other Exercises Other Exercises: Epley maneuver x2.    General Comments General comments (skin integrity, edema, etc.): BP 107/78 in standing. Complains of constant dizziness - worse with positional changes. incresed symptoms in Rt dix-hallpike position, difficult to appreciate any  true torsional nystagmus, eyes wandering, possible slow upward vertical nystagmus. Educated on symptom awareness, self care techniques following repositioning maneuver, gaze fixation, and safety with mobility.      Pertinent Vitals/Pain Pain Assessment Pain Assessment: No/denies pain    Home Living                          Prior Function            PT Goals (current goals can now be found in the care plan section) Acute Rehab PT Goals Patient Stated Goal: to go home PT Goal Formulation: With patient Time For Goal Achievement: 03/27/24 Potential to Achieve Goals: Good Progress towards PT goals: Progressing toward goals    Frequency    Min 2X/week      PT Plan      Co-evaluation              AM-PAC PT "6 Clicks" Mobility   Outcome Measure  Help needed turning from your back to your side while in a flat bed without using bedrails?: None Help needed moving from lying on your back to sitting on the side of a flat bed without using bedrails?: None Help needed moving to and from a bed to a chair (including a wheelchair)?: A Little Help needed standing up from a chair using your arms (e.g., wheelchair or bedside chair)?: A Little Help needed to walk in hospital room?: A Little Help needed climbing 3-5 steps with a railing? : Total 6 Click Score: 18    End of Session Equipment Utilized During Treatment: Gait belt Activity Tolerance: Patient tolerated treatment well Patient left: in bed;with call bell/phone within reach;with bed alarm set   PT Visit Diagnosis: Dizziness and giddiness (R42);BPPV;Unsteadiness on feet (R26.81);Muscle weakness (generalized) (M62.81) BPPV - Right/Left : Right     Time: 1610-9604 PT Time Calculation (min) (ACUTE ONLY): 27 min  Charges:    $Self Care/Home Management: 8-22 $Canalith Rep Proc: 8-22 mins PT General Charges $$ ACUTE PT VISIT: 1 Visit                     Jory Ng, PT, DPT [facility]  Rehabilitation  Services Physical Therapist Office: [phone] Website: [email or web address]    Alinda Irani 03/14/2024, 12:32 PM

## 2024-03-14 NOTE — Assessment & Plan Note (Addendum)
 New this AM with Hgb 13.5 > 12.4.  Asymptomatic.All cell lines down fro yesterday, suspect this is dilutional. Patient was transitioned from home Eliquis  to subcutaneous heparin  yesterday in anticipation of upcoming IR procedure as above.  B12 obtained on arrival WNL. - AM CBC - consider iron panel if Hgb continues to worsen

## 2024-03-14 NOTE — Plan of Care (Signed)

## 2024-03-15 DIAGNOSIS — E861 Hypovolemia: Secondary | ICD-10-CM | POA: Diagnosis not present

## 2024-03-15 DIAGNOSIS — R55 Syncope and collapse: Secondary | ICD-10-CM | POA: Diagnosis not present

## 2024-03-15 LAB — BASIC METABOLIC PANEL WITH GFR
Anion gap: 7 (ref 5–15)
BUN: 12 mg/dL (ref 8–23)
CO2: 25 mmol/L (ref 22–32)
Calcium: 8.6 mg/dL — ABNORMAL LOW (ref 8.9–10.3)
Chloride: 105 mmol/L (ref 98–111)
Creatinine, Ser: 1.05 mg/dL (ref 0.61–1.24)
GFR, Estimated: >60 mL/min (ref >60)
Glucose, Bld: 95 mg/dL (ref 70–99)
Potassium: 3.9 mmol/L (ref 3.5–5.1)
Sodium: 137 mmol/L (ref 135–145)

## 2024-03-15 LAB — CBC
HCT: 37.3 % — ABNORMAL LOW (ref 39.0–52.0)
Hemoglobin: 12.4 g/dL — ABNORMAL LOW (ref 13.0–17.0)
MCH: 26.6 pg (ref 26.0–34.0)
MCHC: 33.2 g/dL (ref 30.0–36.0)
MCV: 80 fL (ref 80.0–100.0)
Platelets: 239 10*3/uL (ref 150–400)
RBC: 4.66 MIL/uL (ref 4.22–5.81)
RDW: 16.8 % — ABNORMAL HIGH (ref 11.5–15.5)
WBC: 4.4 10*3/uL (ref 4.0–10.5)
nRBC: 0 % (ref 0.0–0.2)

## 2024-03-15 LAB — MAGNESIUM: Magnesium: 2.3 mg/dL (ref 1.7–2.4)

## 2024-03-15 LAB — GLUCOSE, CAPILLARY
Glucose-Capillary: 181 mg/dL — ABNORMAL HIGH (ref 70–99)
Glucose-Capillary: 94 mg/dL (ref 70–99)
Glucose-Capillary: 120 mg/dL — ABNORMAL HIGH (ref 70–99)
Glucose-Capillary: 182 mg/dL — ABNORMAL HIGH (ref 70–99)

## 2024-03-15 MED ORDER — POTASSIUM CHLORIDE CRYS ER 10 MEQ PO TBCR
10.0000 meq | EXTENDED_RELEASE_TABLET | Freq: Once | ORAL | Status: AC
  Administered 2024-03-15: 10 meq via ORAL
  Filled 2024-03-15: qty 1

## 2024-03-15 MED ORDER — HEPARIN SODIUM (PORCINE) 5000 UNIT/ML IJ SOLN
5000.0000 [IU] | Freq: Three times a day (TID) | INTRAMUSCULAR | Status: AC
  Administered 2024-03-15 (×2): 5000 [IU] via SUBCUTANEOUS
  Filled 2024-03-15 (×2): qty 1

## 2024-03-15 NOTE — Assessment & Plan Note (Signed)
 Chronic pain, knees: Continue home lidocaine  patch PRN, gabapentin  100 mg twice daily Nonsustained VT: Continue home amiodarone  200 mg daily HFrEF, ischemic cardiomyopathy: Continue home digoxin  0.125 daily; hold Jardiance  given AKI; holding Entresto , spironolactone , torsemide  given soft pressures, restart as indicated. HLD: Continue home Zetia  10 mg daily GERD: Continue pantoprazole  40 mg daily T2DM: sSSI ordered; holding home metformin  HTN: Continue holding home spironolactone , torsemide  for normotensive pressures; restart as indicated

## 2024-03-15 NOTE — Assessment & Plan Note (Addendum)
 Dizziness still present per patient - PT/OT following, requested vestibular dysfunction testing - encourage PO intake - Fall precautions

## 2024-03-15 NOTE — Assessment & Plan Note (Addendum)
 Hemoglobin 12.4, stable - AM CBC - consider iron panel if Hgb continues to worsen

## 2024-03-15 NOTE — Progress Notes (Signed)
 Daily Progress Note Intern Pager: [phone]  Patient name: Daniel Li Medical record number: [id number] Date of birth: [birth year] Age: 68 y.o. Gender: male  Primary Care Provider: Orlena Bitters, MD Consultants: IR Code Status: DNR  Pt Overview and Major Events to Date:  5/15-admitted  Assessment and Plan:  Murice is Daughtry is a 68 year old male who presented from concern for strokelike symptoms found to be at his baseline neurologic function, also presented with near syncope with hypotension. Pertinent PMH/PSH includes T2DM, HFrEF with a EF of 20%, PE on Eliquis , history of CVA with residual left-sided deficits, CAD s/p PCI with ICD, AAA.  Currently awaiting IR procedure on 5/19 for bilateral carotid stenosis Assessment & Plan Near syncope  Dizziness Dizziness still present per patient - PT/OT following, requested vestibular dysfunction testing - encourage PO intake - Fall precautions Intracranial carotid stenosis, bilateral Initially seen on 5/15 CTA head and neck.   - Interventional radiology consulted and plan for diagnostic cerebral arteriogram on Monday 5/19. - Patient transitioned from Eliquis  to heparin  in anticipation of procedure - Heparin  last dose 2200 tonight 5/18, restart after arteriogram (may require procedure pending arteriogram results) Anemia Hemoglobin 12.4, stable - AM CBC - consider iron panel if Hgb continues to worsen Hypothyroidism TSH of 11.6 on arrival lab workup; subsequent T4 WNL at 0.79.  T3 63.  Initiated on levothyroxine  100 mcg 5/16.  - Continue levothyroxine  100 mcg - follow up TSH in 4-6 weeks AKI (acute kidney injury) (HCC) Resolved, restarted jardiance  yesterday 5/17 - Continue holding home spironolactone , torsemide  for normotensive to softer pressures - Continue to encourage good PO Chronic health problem Chronic pain, knees: Continue home lidocaine  patch PRN, gabapentin  100 mg twice daily Nonsustained VT: Continue home  amiodarone  200 mg daily HFrEF, ischemic cardiomyopathy: Continue home digoxin  0.125 daily; hold Jardiance  given AKI; holding Entresto , spironolactone , torsemide  given soft pressures, restart as indicated. HLD: Continue home Zetia  10 mg daily GERD: Continue pantoprazole  40 mg daily T2DM: sSSI ordered; holding home metformin  HTN: Continue holding home spironolactone , torsemide  for normotensive pressures; restart as indicated   FEN/GI: Carb modified diet PPx: Heparin  Dispo:Home with home health pending clinical improvement . Barriers include diagnostic cerebral arteriogram on Monday 5/ 19.   Subjective:  States he is still having some dizziness with sitting and standing up.  He is getting ready for arteriogram tomorrow.  Objective: Temp:  [97.7 F (36.5 C)-98.2 F (36.8 C)] 98 F (36.7 C) (05/18 0015) Pulse Rate:  [54-76] 66 (05/18 0015) Resp:  [16-18] 16 (05/18 0015) BP: (91-101)/(62-71) 91/62 (05/18 0015) SpO2:  [95 %-98 %] 96 % (05/18 0015) Physical Exam: General: NAD, awake, alert, responsive to all questions Cardiovascular: Regular rate and rhythm, no murmurs rubs or gallops Respiratory: CTAB no increased work of breathing on room air Abdomen: Soft, nontender to palpation Extremities: No lower extremity edema or calf tenderness  Laboratory: Most recent CBC Lab Results  Component Value Date   WBC 4.9 03/14/2024   HGB 12.4 (L) 03/14/2024   HCT 38.1 (L) 03/14/2024   MCV 81.1 03/14/2024   PLT 223 03/14/2024   Most recent BMP    Latest Ref Rng & Units 03/14/2024    4:15 AM  BMP  Glucose 70 - 99 mg/dL 956   BUN 8 - 23 mg/dL 11   Creatinine 2.13 - 1.24 mg/dL 0.86   Sodium 578 - 469 mmol/L 137   Potassium 3.5 - 5.1 mmol/L 4.0   Chloride 98 - 111 mmol/L  104   CO2 22 - 32 mmol/L 27   Calcium  8.9 - 10.3 mg/dL 8.7     Genora Kidd, MD 03/15/2024, 12:27 AM  PGY-3, [facility] Family Medicine FPTS Intern pager: [phone], text pages welcome Secure chat group [facility]  [facility] Teaching Service

## 2024-03-15 NOTE — Assessment & Plan Note (Addendum)
 Initially seen on 5/15 CTA head and neck.   - Interventional radiology consulted and plan for diagnostic cerebral arteriogram on Monday 5/19. - Patient transitioned from Eliquis  to heparin  in anticipation of procedure - Heparin  last dose 2200 tonight 5/18, restart after arteriogram (may require procedure pending arteriogram results)

## 2024-03-15 NOTE — Assessment & Plan Note (Addendum)
 Resolved, restarted jardiance  yesterday 5/17 - Continue holding home spironolactone , torsemide  for normotensive to softer pressures - Continue to encourage good PO

## 2024-03-15 NOTE — Assessment & Plan Note (Signed)
 TSH of 11.6 on arrival lab workup; subsequent T4 WNL at 0.79.  T3 63.  Initiated on levothyroxine  100 mcg 5/16.  - Continue levothyroxine  100 mcg - follow up TSH in 4-6 weeks

## 2024-03-16 ENCOUNTER — Encounter: Attending: Cardiovascular Disease

## 2024-03-16 ENCOUNTER — Inpatient Hospital Stay (HOSPITAL_COMMUNITY)

## 2024-03-16 DIAGNOSIS — I6529 Occlusion and stenosis of unspecified carotid artery: Secondary | ICD-10-CM | POA: Diagnosis not present

## 2024-03-16 DIAGNOSIS — Z95810 Presence of automatic (implantable) cardiac defibrillator: Secondary | ICD-10-CM | POA: Diagnosis not present

## 2024-03-16 DIAGNOSIS — I5042 Chronic combined systolic (congestive) and diastolic (congestive) heart failure: Secondary | ICD-10-CM | POA: Insufficient documentation

## 2024-03-16 DIAGNOSIS — R55 Syncope and collapse: Secondary | ICD-10-CM | POA: Diagnosis not present

## 2024-03-16 DIAGNOSIS — E861 Hypovolemia: Secondary | ICD-10-CM | POA: Diagnosis not present

## 2024-03-16 HISTORY — PX: IR ANGIO VERTEBRAL SEL VERTEBRAL BILAT MOD SED: IMG5369

## 2024-03-16 HISTORY — PX: IR ANGIO INTRA EXTRACRAN SEL COM CAROTID INNOMINATE BILAT MOD SED: IMG5360

## 2024-03-16 LAB — BASIC METABOLIC PANEL WITH GFR
Anion gap: 7 (ref 5–15)
BUN: 11 mg/dL (ref 8–23)
CO2: 26 mmol/L (ref 22–32)
Calcium: 8.9 mg/dL (ref 8.9–10.3)
Chloride: 106 mmol/L (ref 98–111)
Creatinine, Ser: 1.01 mg/dL (ref 0.61–1.24)
GFR, Estimated: >60 mL/min (ref >60)
Glucose, Bld: 105 mg/dL — ABNORMAL HIGH (ref 70–99)
Potassium: 3.8 mmol/L (ref 3.5–5.1)
Sodium: 139 mmol/L (ref 135–145)

## 2024-03-16 LAB — CBC
HCT: 38.4 % — ABNORMAL LOW (ref 39.0–52.0)
HCT: 38.7 % — ABNORMAL LOW (ref 39.0–52.0)
Hemoglobin: 12.3 g/dL — ABNORMAL LOW (ref 13.0–17.0)
Hemoglobin: 12.5 g/dL — ABNORMAL LOW (ref 13.0–17.0)
MCH: 26.3 pg (ref 26.0–34.0)
MCH: 26.3 pg (ref 26.0–34.0)
MCHC: 32 g/dL (ref 30.0–36.0)
MCHC: 32.3 g/dL (ref 30.0–36.0)
MCV: 81.5 fL (ref 80.0–100.0)
MCV: 82.1 fL (ref 80.0–100.0)
Platelets: 223 10*3/uL (ref 150–400)
Platelets: 229 10*3/uL (ref 150–400)
RBC: 4.68 MIL/uL (ref 4.22–5.81)
RBC: 4.75 MIL/uL (ref 4.22–5.81)
RDW: 16.6 % — ABNORMAL HIGH (ref 11.5–15.5)
RDW: 17.1 % — ABNORMAL HIGH (ref 11.5–15.5)
WBC: 4.7 10*3/uL (ref 4.0–10.5)
WBC: 4.8 10*3/uL (ref 4.0–10.5)
nRBC: 0 % (ref 0.0–0.2)
nRBC: 0 % (ref 0.0–0.2)

## 2024-03-16 LAB — PROTIME-INR
INR: 1.3 — ABNORMAL HIGH (ref 0.8–1.2)
Prothrombin Time: 16.6 s — ABNORMAL HIGH (ref 11.4–15.2)

## 2024-03-16 LAB — GLUCOSE, CAPILLARY
Glucose-Capillary: 113 mg/dL — ABNORMAL HIGH (ref 70–99)
Glucose-Capillary: 101 mg/dL — ABNORMAL HIGH (ref 70–99)
Glucose-Capillary: 167 mg/dL — ABNORMAL HIGH (ref 70–99)
Glucose-Capillary: 139 mg/dL — ABNORMAL HIGH (ref 70–99)

## 2024-03-16 LAB — MAGNESIUM: Magnesium: 2.1 mg/dL (ref 1.7–2.4)

## 2024-03-16 MED ORDER — FENTANYL CITRATE (PF) 100 MCG/2ML IJ SOLN
INTRAMUSCULAR | Status: AC | PRN
  Administered 2024-03-16: 25 ug via INTRAVENOUS

## 2024-03-16 MED ORDER — LIDOCAINE HCL 1 % IJ SOLN
INTRAMUSCULAR | Status: AC
  Filled 2024-03-16: qty 20

## 2024-03-16 MED ORDER — DIPHENHYDRAMINE HCL 50 MG/ML IJ SOLN
INTRAMUSCULAR | Status: AC
  Filled 2024-03-16: qty 1

## 2024-03-16 MED ORDER — POTASSIUM CHLORIDE CRYS ER 20 MEQ PO TBCR
20.0000 meq | EXTENDED_RELEASE_TABLET | Freq: Once | ORAL | Status: AC
  Administered 2024-03-16: 20 meq via ORAL
  Filled 2024-03-16: qty 1

## 2024-03-16 MED ORDER — SODIUM CHLORIDE 0.9 % IV SOLN
INTRAVENOUS | Status: DC
Start: 2024-03-16 — End: 2024-03-17

## 2024-03-16 MED ORDER — IOHEXOL 300 MG/ML  SOLN
100.0000 mL | Freq: Once | INTRAMUSCULAR | Status: DC | PRN
Start: 2024-03-16 — End: 2024-03-17

## 2024-03-16 MED ORDER — LIDOCAINE HCL (PF) 1 % IJ SOLN
20.0000 mL | Freq: Once | INTRAMUSCULAR | Status: DC
  Filled 2024-03-16 (×2): qty 20

## 2024-03-16 MED ORDER — SODIUM CHLORIDE 0.9 % IV BOLUS
INTRAVENOUS | Status: AC | PRN
  Administered 2024-03-16: 250 mL via INTRAVENOUS

## 2024-03-16 MED ORDER — IOHEXOL 300 MG/ML  SOLN: 150.0000 mL | Freq: Once | INTRAMUSCULAR | Status: DC | PRN

## 2024-03-16 MED ORDER — FENTANYL CITRATE (PF) 100 MCG/2ML IJ SOLN
INTRAMUSCULAR | Status: AC
  Filled 2024-03-16: qty 2

## 2024-03-16 MED ORDER — HEPARIN SODIUM (PORCINE) 1000 UNIT/ML IJ SOLN
INTRAMUSCULAR | Status: AC | PRN
  Administered 2024-03-16: 1000 [IU] via INTRAVENOUS

## 2024-03-16 MED ORDER — HEPARIN SODIUM (PORCINE) 1000 UNIT/ML IJ SOLN
INTRAMUSCULAR | Status: AC
  Filled 2024-03-16: qty 10

## 2024-03-16 MED ORDER — HEPARIN SODIUM (PORCINE) 5000 UNIT/ML IJ SOLN
5000.0000 [IU] | Freq: Three times a day (TID) | INTRAMUSCULAR | Status: DC
  Administered 2024-03-16 – 2024-03-17 (×3): 5000 [IU] via SUBCUTANEOUS
  Filled 2024-03-16 (×3): qty 1

## 2024-03-16 MED ORDER — MIDAZOLAM HCL 2 MG/2ML IJ SOLN
INTRAMUSCULAR | Status: AC
  Filled 2024-03-16: qty 2

## 2024-03-16 MED ORDER — DIPHENHYDRAMINE HCL 50 MG/ML IJ SOLN
25.0000 mg | Freq: Once | INTRAMUSCULAR | Status: AC
  Administered 2024-03-16: 25 mg via INTRAVENOUS

## 2024-03-16 MED ORDER — LIDOCAINE HCL (PF) 1 % IJ SOLN
20.0000 mL | Freq: Once | INTRAMUSCULAR | Status: DC
  Filled 2024-03-16: qty 20

## 2024-03-16 NOTE — Plan of Care (Signed)
 FMTS Interim Post Operative Progress Note  S:Patient feeling well post IR procedure. Sleeping on entry to room, but awakens to voice and touch. No complaints or concerns  O: BP 96/71 (BP Location: Left Arm)   Pulse (!) 56   Temp 97.6 F (36.4 C) (Oral)   Resp 12   Ht 5\' 6"  (1.676 m)   Wt 68 kg   SpO2 98%   BMI 24.21 kg/m   General: Well appearing, no distress Cardiac: RRR, no M/R/G Respiratory: CTAB, no increased work of breathing Extremities: Left arm with clean and well-maintained operative site, no evidence of bruising or hemorrhage.  No peripheral edema, 2+ bilateral pulses in the lower extremities.  A/P:  Left ICA stenosis Diagnostic cerebral arteriogram showed mild to moderate stenosis of branches superior division of left MCA and wide patency of supraclinoid ICAs bilaterally.  No intraprocedural interventions done.  Will await further recommendations based on findings. - Continue ASA therapy 81 mg - Consider alternate therapy to Zetia   Hypothyroidism Stable, continue levothyroxine   Rayma Calandra, DO 03/16/2024, 1:04 PM PGY-1, [facility] Family Medicine Service pager [phone]

## 2024-03-16 NOTE — Assessment & Plan Note (Signed)
 Chronic pain, knees: Continue home lidocaine  patch PRN, gabapentin  100 mg twice daily Nonsustained VT: Continue home amiodarone  200 mg daily HFrEF, ischemic cardiomyopathy: Continue home digoxin  0.125 daily; hold Jardiance  given AKI; holding Entresto , spironolactone , torsemide  given soft pressures, restart as indicated. HLD: Continue home Zetia  10 mg daily GERD: Continue pantoprazole  40 mg daily T2DM: sSSI ordered; holding home metformin  HTN: Continue holding home spironolactone , torsemide  for normotensive pressures; restart as indicated

## 2024-03-16 NOTE — Procedures (Signed)
 INR.  Status post four-vessel cerebral arteriogram.  Right CFA approach.  Findings.  Small focal  ulceration along the medial wall of the right internal carotid artery bulb.  2.  Mild to moderate stenosis of a  branch of the superior  division of the left middle cerebral artery proximally.  3.  Wide patency of the supraclinoid  ICAs bilaterally.  Jory Ng MD.

## 2024-03-16 NOTE — Care Management Important Message (Signed)
 Important Message  Patient Details  Name: Daniel Li MRN: [id number] Date of Birth: Mar 26, 1956   Important Message Given:  Yes - Medicare IM     Felix Host 03/16/2024, 3:03 PM

## 2024-03-16 NOTE — Assessment & Plan Note (Signed)
 TSH of 11.6 on arrival lab workup; subsequent T4 WNL at 0.79.  T3 63.  Initiated on levothyroxine  100 mcg 5/16.  - Continue levothyroxine  100 mcg - follow up TSH in 4-6 weeks

## 2024-03-16 NOTE — Assessment & Plan Note (Signed)
 Resolved, restarted jardiance  yesterday 5/17 - Continue holding home spironolactone , torsemide  for normotensive to softer pressures - Continue to encourage good PO

## 2024-03-16 NOTE — Assessment & Plan Note (Addendum)
 Patient to undergo diagnostic cerebral arteriogram with IR procedures today, has been n.p.o. since midnight - Patient transitioned from Eliquis  to heparin  in anticipation of procedure - Heparin  last dose 2200 5/18, restart after arteriogram (may require procedure pending arteriogram results) -Will perform postop eval

## 2024-03-16 NOTE — Assessment & Plan Note (Addendum)
 Hemoglobin 12.3, stable - AM CBC - consider iron panel if Hgb continues to worsen

## 2024-03-16 NOTE — Assessment & Plan Note (Addendum)
 Dizziness still present but improved. - PT/OT following, requested vestibular dysfunction testing - encourage PO intake - Fall precautions

## 2024-03-16 NOTE — Progress Notes (Signed)
 Patient for diagnostic cerebral angiogram today with NIR. Per chart review patient with iodinated contrast allergy with unknown reaction. Discussed with patient in IR holding, he has no contrast allergy that he is aware of and has never had any difficulty breathing, swelling, rashes, wheezing, itching or other concerns after receiving IV contrast previously. Will give Benadryl  25 mg IV x 1 as a precaution and remove iodinated contrast allergy from EMR. He confirms that he has no true allergies, he does not eat port due to religious reasons.  We also reviewed DNR status - discussed with patient that typically DNR is suspended during the procedure which would mean he would be FULL CODE during the procedure and then he would return to previous DNR status after procedure. He understands this and is in agreement.  Will proceed with diagnostic cerebral angiogram today as planned.  Nathan Bake, PA-C

## 2024-03-16 NOTE — Progress Notes (Signed)
 Daily Progress Note Intern Pager: [phone]  Patient name: Daniel Li Medical record number: [id number] Date of birth: 06-May-1956 Age: 68 y.o. Gender: male  Primary Care Provider: Orlena Bitters, MD Consultants: IR Code Status: DNR  Pt Overview and Major Events to Date:  5/15- admitted  Assessment and Plan:  This is a 68 year old male patient presented with concern for strokelike symptoms found to be at baseline neurologic function also with near syncope and hypotension.  Pertinent PMH includes T2DM, HFrEF with EF of 20%, PE on Eliquis , history of CVA with residual left-sided deficits, CAD s/p PCI with ICD, AAA.  Patient is scheduled undergo IR procedure today for bilateral carotid stenosis. Assessment & Plan Near syncope  Dizziness Dizziness still present but improved. - PT/OT following, requested vestibular dysfunction testing - encourage PO intake - Fall precautions Intracranial carotid stenosis, bilateral Patient to undergo diagnostic cerebral arteriogram with IR procedures today, has been n.p.o. since midnight - Patient transitioned from Eliquis  to heparin  in anticipation of procedure - Heparin  last dose 2200 5/18, restart after arteriogram (may require procedure pending arteriogram results) -Will perform postop eval Anemia Hemoglobin 12.3, stable - AM CBC - consider iron panel if Hgb continues to worsen Hypothyroidism TSH of 11.6 on arrival lab workup; subsequent T4 WNL at 0.79.  T3 63.  Initiated on levothyroxine  100 mcg 5/16.  - Continue levothyroxine  100 mcg - follow up TSH in 4-6 weeks AKI (acute kidney injury) (HCC) (Resolved: 03/16/2024) Resolved, restarted jardiance  yesterday 5/17 - Continue holding home spironolactone , torsemide  for normotensive to softer pressures - Continue to encourage good PO Chronic health problem Chronic pain, knees: Continue home lidocaine  patch PRN, gabapentin  100 mg twice daily Nonsustained VT: Continue home amiodarone  200  mg daily HFrEF, ischemic cardiomyopathy: Continue home digoxin  0.125 daily; hold Jardiance  given AKI; holding Entresto , spironolactone , torsemide  given soft pressures, restart as indicated. HLD: Continue home Zetia  10 mg daily GERD: Continue pantoprazole  40 mg daily T2DM: sSSI ordered; holding home metformin  HTN: Continue holding home spironolactone , torsemide  for normotensive pressures; restart as indicated  FEN/GI: N.p.o. pending procedure PPx: Heparin  Dispo:Home with home health pending clinical improvement .   Subjective:  Tired but well.  Objective: Temp:  [97.9 F (36.6 C)-98.4 F (36.9 C)] 98 F (36.7 C) (05/19 0749) Pulse Rate:  [53-58] 53 (05/19 0749) Resp:  [16-18] 16 (05/19 0749) BP: (89-98)/(53-66) 93/61 (05/19 0443) SpO2:  [96 %-97 %] 96 % (05/19 0749) Physical Exam: General: Well appearing elderly gentleman, no distress Cardiovascular: RRR, no M/R/G Respiratory: CTAB, no increased work of breathing Abdomen: Flat, soft, nontender Extremities: 2+ peripheral pulses, no evidence of peripheral edema  Laboratory: Most recent CBC Lab Results  Component Value Date   WBC 4.8 03/16/2024   HGB 12.3 (L) 03/16/2024   HCT 38.4 (L) 03/16/2024   MCV 82.1 03/16/2024   PLT 229 03/16/2024   Most recent BMP    Latest Ref Rng & Units 03/16/2024    6:18 AM  BMP  Glucose 70 - 99 mg/dL 147   BUN 8 - 23 mg/dL 11   Creatinine 8.29 - 1.24 mg/dL 5.62   Sodium 130 - 865 mmol/L 139   Potassium 3.5 - 5.1 mmol/L 3.8   Chloride 98 - 111 mmol/L 106   CO2 22 - 32 mmol/L 26   Calcium  8.9 - 10.3 mg/dL 8.9     Rayma Calandra, DO 03/16/2024, 7:50 AM  PGY-1, [facility] FPTS Intern pager: [phone], text pages welcome Secure chat  group [facility] Teaching Service

## 2024-03-17 ENCOUNTER — Other Ambulatory Visit (HOSPITAL_COMMUNITY): Payer: Self-pay

## 2024-03-17 ENCOUNTER — Inpatient Hospital Stay (HOSPITAL_COMMUNITY)

## 2024-03-17 DIAGNOSIS — R55 Syncope and collapse: Secondary | ICD-10-CM | POA: Diagnosis not present

## 2024-03-17 DIAGNOSIS — I724 Aneurysm of artery of lower extremity: Secondary | ICD-10-CM

## 2024-03-17 DIAGNOSIS — E861 Hypovolemia: Secondary | ICD-10-CM | POA: Diagnosis not present

## 2024-03-17 LAB — CBC
HCT: 36.8 % — ABNORMAL LOW (ref 39.0–52.0)
Hemoglobin: 11.8 g/dL — ABNORMAL LOW (ref 13.0–17.0)
MCH: 26.1 pg (ref 26.0–34.0)
MCHC: 32.1 g/dL (ref 30.0–36.0)
MCV: 81.4 fL (ref 80.0–100.0)
Platelets: 210 10*3/uL (ref 150–400)
RBC: 4.52 MIL/uL (ref 4.22–5.81)
RDW: 17 % — ABNORMAL HIGH (ref 11.5–15.5)
WBC: 4.4 10*3/uL (ref 4.0–10.5)
nRBC: 0 % (ref 0.0–0.2)

## 2024-03-17 LAB — BASIC METABOLIC PANEL WITH GFR
Anion gap: 6 (ref 5–15)
BUN: 12 mg/dL (ref 8–23)
CO2: 24 mmol/L (ref 22–32)
Calcium: 8.5 mg/dL — ABNORMAL LOW (ref 8.9–10.3)
Chloride: 109 mmol/L (ref 98–111)
Creatinine, Ser: 1.11 mg/dL (ref 0.61–1.24)
GFR, Estimated: >60 mL/min (ref >60)
Glucose, Bld: 139 mg/dL — ABNORMAL HIGH (ref 70–99)
Potassium: 3.9 mmol/L (ref 3.5–5.1)
Sodium: 139 mmol/L (ref 135–145)

## 2024-03-17 LAB — GLUCOSE, CAPILLARY
Glucose-Capillary: 128 mg/dL — ABNORMAL HIGH (ref 70–99)
Glucose-Capillary: 135 mg/dL — ABNORMAL HIGH (ref 70–99)

## 2024-03-17 LAB — MAGNESIUM: Magnesium: 2.1 mg/dL (ref 1.7–2.4)

## 2024-03-17 MED ORDER — LEVOTHYROXINE SODIUM 100 MCG PO TABS: 100.0000 ug | ORAL_TABLET | Freq: Every day | ORAL | 0 refills | Status: AC

## 2024-03-17 MED ORDER — LEVOTHYROXINE SODIUM 100 MCG PO TABS
100.0000 ug | ORAL_TABLET | Freq: Every day | ORAL | 0 refills | Status: DC
  Filled 2024-03-17: qty 30, 30d supply, fill #0

## 2024-03-17 MED ORDER — POTASSIUM CHLORIDE CRYS ER 20 MEQ PO TBCR
20.0000 meq | EXTENDED_RELEASE_TABLET | Freq: Once | ORAL | Status: AC
  Administered 2024-03-17: 20 meq via ORAL
  Filled 2024-03-17: qty 1

## 2024-03-17 NOTE — NC FL2 (Addendum)
 [place]  MEDICAID FL2 LEVEL OF CARE FORM     IDENTIFICATION  Patient Name: Daniel Li Birthdate: Daniel 25, 1957 Sex: male Admission Date (Current Location): 03/12/2024  [facility] and [place] Number:  Producer, television/film/video and Address:  [facility], [address] [place], [place] [address]      Provider Number: [id number]  Attending Physician Name and Address:  Arn Lane, MD  Relative Name and Phone Number:       Current Level of Care: Hospital Recommended Level of Care: Assisted Living Facility Prior Approval Number:    Date Approved/Denied:   PASRR Number:    Discharge Plan: Other (Comment) (ALF)    Current Diagnoses: Patient Active Problem List   Diagnosis Date Noted   Anemia 03/14/2024   Hypotension due to hypovolemia 03/14/2024   Hypothyroidism 03/13/2024   History of CVA with residual deficit 03/13/2024   Internal carotid artery occlusion 03/13/2024   Near syncope  Dizziness 03/12/2024   Chronic health problem 03/12/2024   AAA (abdominal aortic aneurysm) without rupture (HCC) 03/03/2024   Moderate mitral insufficiency 08/07/2023   Cocaine positive 12/09/2022   Mitral regurgitation 03/22/2022   CAD in native artery 08/23/2020   Unstable angina (HCC) 08/22/2020   Status post coronary artery stent placement    Hyponatremia    Gastroesophageal reflux disease    Dehydration    Acute on chronic renal failure (HCC) 05/25/2018   Abdominal pain, acute, epigastric 05/17/2018   Hernia of abdominal wall 05/17/2018   Presumed Gastritis 05/17/2018   Generalized abdominal pain    Elevated troponin    Chest pain 04/29/2018   ICD (implantable cardioverter-defibrillator) in place 03/05/2018   Anticoagulated on Coumadin  03/05/2018   Non-STEMI (non-ST elevated myocardial infarction) (HCC) 02/12/2018   Precordial chest pain 02/11/2018   Chronic systolic CHF (congestive heart failure) (HCC) 02/11/2018   Chronic combined systolic  (congestive) and diastolic (congestive) heart failure (HCC)    STEMI (ST elevation myocardial infarction) (HCC) 02/21/2017   Ischemic cardiomyopathy 07/20/2014   High risk medication use 01/28/2013   Special screening for malignant neoplasms, colon 01/28/2013   Hypercholesterolemia    Tobacco abuse    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   Benign essential HTN 03/26/2012   CAD S/P percutaneous coronary angioplasty 03/26/2012    Orientation RESPIRATION BLADDER Height & Weight     Self, Time, Situation, Place  Normal Continent Weight: 150 lb (68 kg) Height:  5\' 6"  (167.6 cm)  BEHAVIORAL SYMPTOMS/MOOD NEUROLOGICAL BOWEL NUTRITION STATUS      Continent Diet (No added table salt)  AMBULATORY STATUS COMMUNICATION OF NEEDS Skin   Limited Assist Verbally Other (Comment) (WDL,Wound/Incision LDAs)                       Personal Care Assistance Level of Assistance  Bathing, Feeding, Dressing Bathing Assistance: Limited assistance Feeding assistance: Independent Dressing Assistance: Limited assistance     Functional Limitations Info  Sight, Hearing, Speech Sight Info: Adequate Hearing Info: Adequate Speech Info: Adequate    SPECIAL CARE FACTORS FREQUENCY  PT (By licensed PT), OT (By licensed OT)     PT Frequency: Please see HH PT orders arranged with [facility] OT Frequency: Please see HH OT orders arranged with [facility]            Contractures Contractures Info: Not present    Additional Factors Info  Code Status, Allergies, Psychotropic, Insulin  Sliding Scale Code Status Info: DNR Allergies  Info: Pork-derived Products Psychotropic Info: gabapentin  (NEURONTIN ) capsule 100 mg 2 times daily Insulin  Sliding Scale Info: insulin  aspart (novoLOG ) injection 0-9 Units 3 times daily with meals       TAKE these medications     acetaminophen  325 MG tablet Commonly known as: TYLENOL  Take 650 mg by mouth every 6 (six) hours as needed for mild  pain.    albuterol  108 (90 Base) MCG/ACT inhaler Commonly known as: VENTOLIN  HFA Inhale 2 puffs into the lungs every 4 (four) hours as needed for wheezing or shortness of breath.    amiodarone  200 MG tablet Commonly known as: PACERONE  Take 1 tablet (200 mg total) by mouth daily.    digoxin  0.125 MG tablet Commonly known as: LANOXIN  TAKE (1) TABLET BY MOUTH ONCE DAILY.    Eliquis  5 MG Tabs tablet Generic drug: apixaban  TAKE (1) TABLET BY MOUTH TWICE DAILY. What changed: See the new instructions.    ezetimibe  10 MG tablet Commonly known as: Zetia  Take 1 tablet (10 mg total) by mouth daily.    gabapentin  100 MG capsule Commonly known as: NEURONTIN  Take 100 mg by mouth 2 (two) times daily.    levothyroxine  100 MCG tablet Commonly known as: SYNTHROID  Take 1 tablet (100 mcg total) by mouth daily at 6 (six) AM. Start taking on: Daniel 21, 2025    lidocaine  5 % Commonly known as: LIDODERM  Place 1 patch onto the skin every 12 (twelve) hours. Leave off 12 hours    metFORMIN  500 MG tablet Commonly known as: GLUCOPHAGE  Take 500 mg by mouth daily with breakfast.    nitroGLYCERIN  0.4 MG SL tablet Commonly known as: NITROSTAT  PLACE ONE (1) TABLET UNDER TONGUE EVERY 5 MINUTES UP TO (3) DOSES AS NEEDED FOR CHEST PAIN. IF NO RELIEF, CONTACT MD.    pantoprazole  40 MG tablet Commonly known as: PROTONIX  Take 40 mg by mouth daily.    potassium chloride  10 MEQ tablet Commonly known as: KLOR-CON  Take 2 tablets (20 mEq total) by mouth daily.          Relevant Imaging Results:  Relevant Lab Results:   Additional Information [id number]  Carmon Christen, LCSWA

## 2024-03-17 NOTE — Progress Notes (Signed)
 EPIC Encounter for ICM Monitoring  Patient Name: Daniel Li is a 68 y.o. male Date: 03/17/2024 Primary Care Physican: Orlena Bitters, MD Primary Cardiologist: [facility] Electrophysiologist: Meal[place] 08/10/2022 Office Weight: 140 lbs                11/05/2023 Weight: 150 lbs   03/12/2024 Office Weight: 149 lbs                                 Transmission results reviewed.    CorVue thoracic impedance suggesting normal fluid levels since 4/14.      Prescribed dosage:  Torsemide  20 mg Take 1 tablet(s) (20 mg total) by mouth daily Potassium 10 mEq take 2 tablets (20 mEq total) by mouth daily Spironolactone  25 mg take 1 tablet (25 mg total) daily   Labs: 03/17/2024 Creatinine 1.11, BUN 12, Potassium 3.9, Sodium 139, GFR >60  03/16/2024 Creatinine 1.01, BUN 11, Potassium 3.8, Sodium 139, GFR >60  03/15/2024 Creatinine 1.05, BUN 12, Potassium 3.9, Sodium 137, GFR >60  03/14/2024 Creatinine 1.08, BUN 11, Potassium 4.0, Sodium 137, GFR >60 03/13/2024 Creatinine 1.23, BUN 12, Potassium 3.9, Sodium 137, GFR >60  A complete set of results can be found in Results Review.   Recommendations:  No changes.  Pt seen in office by Dr Arlester Ladd on 5/15 and sent to ER for evaluation.   Follow-up plan: ICM clinic phone appointment on 05/18/2024.    91 day device clinic remote transmission 05/11/2024.     EP/Cardiology Office Visits:    Recall 03/29/2024 with Dr Mitzie Anda.       Copy of ICM check sent to Dr. Arlester Ladd.  3 month ICM trend: 03/11/2024.    Almyra Jain, RN 03/17/2024 4:03 PM

## 2024-03-17 NOTE — Progress Notes (Signed)
 Right groin check  has been completed. Refer to [facility] under chart review to view preliminary results.   03/17/2024  1:48 PM Alejandrina Raimer, Hollace Lund

## 2024-03-17 NOTE — Plan of Care (Signed)
  Problem: Education: Goal: Ability to describe self-care measures that may prevent or decrease complications (Diabetes Survival Skills Education) will improve Outcome: Adequate for Discharge Goal: Individualized Educational Video(s) Outcome: Adequate for Discharge   

## 2024-03-17 NOTE — Discharge Instructions (Signed)
 Dear Daniel Li,   Thank you for letting us  participate in your care! In this section, you will find a brief hospital admission summary of why you were admitted to the hospital, what happened during your admission, your diagnosis/diagnoses, and recommended follow up.  Primary diagnosis: Syncope Treatment plan: hold listed antihypertensives pending follow up with your primary doctor Secondary diagnosis: Bilateral ICA Stenosis Treatment plan: follow up with IR as directed.    POST-HOSPITAL & CARE INSTRUCTIONS We recommend following up with your PCP within 1 week from being discharged from the hospital. Please let PCP/Specialists know of any changes in medications that were made which you will be able to see in the medications section of this packet. Please also follow up with your Cardiologist and the Heart Failure team as soon as possible to adjust medications.   DOCTOR'S APPOINTMENTS & FOLLOW UP Future Appointments  Date Time Provider Department Center  03/17/2024  3:00 PM [facility] VASC US  2 [facility]  05/11/2024 11:30 AM CVD HVT DEVICE REMOTES [facility] H&V  05/11/2024  2:00 PM AP[facility]-CHAIR 1 AP-INFCTR None  08/10/2024 11:30 AM [facility] HVT DEVICE REMOTES [facility] H&V  11/09/2024 11:30 AM CVD HVT DEVICE REMOTES [facility] H&V  02/08/2025 11:30 AM CVD HVT DEVICE REMOTES [facility] H&V  05/10/2025 11:30 AM CVD HVT DEVICE REMOTES [facility] H&V  08/09/2025 11:30 AM CVD HVT DEVICE REMOTES [facility] H&V  11/08/2025 11:30 AM CVD HVT DEVICE REMOTES [facility] H&V     Thank you for choosing [facility]! Take care and be well!  Family Medicine Teaching Service Inpatient Team [place]  [facility]  [address] [place], [place] [address] [phone]

## 2024-03-17 NOTE — TOC Transition Note (Addendum)
 Transition of Care [facility]) - Discharge Note   Patient Details  Name: Daniel Li MRN: [id number] Date of Birth: 1956/04/13  Transition of Care [facility]) CM/SW Contact:  Carmon Christen, LCSWA Phone Number: 03/17/2024, 3:17 PM   Clinical Narrative:     Patient will DC to: [facility]  Anticipated DC date: 03/17/2024  Transport by: taxi  ?  Per MD patient ready for DC to [facility] . RN, patient,  and facility notified of DC. Discharge Summary and FL2 sent sent to facility. RN given number for report tele#[phone]  .   CSW signing off.   Final next level of care: Home w Home Health Services Barriers to Discharge: No Barriers Identified   Patient Goals and CMS Choice Patient states their goals for this hospitalization and ongoing recovery are:: plan to return to ALF   Choice offered to / list presented to :  (Owner of the facility does not have a preference for agency.)      Discharge Placement                       Discharge Plan and Services Additional resources added to the After Visit Summary for   In-house Referral: Clinical Social Work Discharge Planning Services: CM Consult Post Acute Care Choice: Home Health                    HH Arranged: OT, PT [facility] Agency: [facility] Date [facility] Agency Contacted: 03/17/24 Time HH Agency Contacted: [phone] Representative spoke with at [facility] Agency: Loetta Ringer  Social Drivers of Health (SDOH) Interventions SDOH Screenings   Food Insecurity: No Food Insecurity (03/13/2024)  Housing: Low Risk  (03/13/2024)  Transportation Needs: No Transportation Needs (03/13/2024)  Recent Concern: Transportation Needs - Unmet Transportation Needs (01/22/2024)  Utilities: Not At Risk (03/13/2024)  Depression (PHQ2-9): Low Risk  (11/05/2023)  Financial Resource Strain: Low Risk  (02/25/2023)   Received from [facility], [facility]  Social Connections: Socially Isolated (03/13/2024)  Tobacco Use: Medium Risk  (03/12/2024)     Readmission Risk Interventions     No data to display

## 2024-03-17 NOTE — Progress Notes (Signed)
 Attempted to call report to [facility] - unsuccessful at this time.

## 2024-03-17 NOTE — Assessment & Plan Note (Signed)
 TSH of 11.6 on arrival lab workup; subsequent T4 WNL at 0.79.  T3 63.  Initiated on levothyroxine  100 mcg 5/16.  - Continue levothyroxine  100 mcg - follow up TSH in 4-6 weeks

## 2024-03-17 NOTE — Assessment & Plan Note (Signed)
 Chronic pain, knees: Continue home lidocaine  patch PRN, gabapentin  100 mg twice daily Nonsustained VT: Continue home amiodarone  200 mg daily HFrEF, ischemic cardiomyopathy: Continue home digoxin  0.125 daily; hold Jardiance  given AKI; holding Entresto , spironolactone , torsemide  given soft pressures, restart as indicated. HLD: Continue home Zetia  10 mg daily GERD: Continue pantoprazole  40 mg daily T2DM: sSSI ordered; holding home metformin  HTN: Continue holding home spironolactone , torsemide  for normotensive pressures; restart as indicated

## 2024-03-17 NOTE — Progress Notes (Signed)
   03/17/24 1013  Orthostatic Lying   BP- Lying 97/66  Pulse- Lying 60  Orthostatic Sitting  BP- Sitting 96/71  Pulse- Sitting 63  Orthostatic Standing at 0 minutes  BP- Standing at 0 minutes 97/72  Pulse- Standing at 0 minutes 66  Orthostatic Standing at 3 minutes  BP- Standing at 3 minutes 104/76  Pulse- Standing at 3 minutes 65   Daniel Calandra, DO notified.

## 2024-03-17 NOTE — Progress Notes (Signed)
     Daily Progress Note Intern Pager: [phone]  Patient name: Daniel Li Medical record number: [id number] Date of birth: 15-Sep-1956 Age: 68 y.o. Gender: male  Primary Care Provider: Orlena Bitters, MD Consultants: Radiology procedures Code Status: DNR  Pt Overview and Major Events to Date:  5/15-admitted 5/19: Diagnostic cerebral arteriogram completed  Assessment and Plan:  This is a 68 year old male patient presenting with concern for strokelike symptoms found to be at baseline neurologic function also with near syncope and hypotension.  Pertinent PMH includes T2DM, HFrEF with EF of 20%, PE on Eliquis , history of CVA with residual left-sided deficits, CAD status post PCI with ICD, and AAA.  Patient had successful diagnostic cerebral arteriogram yesterday. Assessment & Plan Near syncope  Dizziness Dizziness still present but improved. - PT/OT following, requested vestibular dysfunction testing - encourage PO intake - Fall precautions Internal carotid artery occlusion Patient tolerated procedure yesterday well but is having some increased groin pain this morning. - Restart home Eliquis  -IR will complete ultrasound of groin to assess for hematoma, if cleared patient will be stable for discharge Anemia Hemoglobin 11.8, patient having groin ultrasound today, may represent small amount of bleeding. - AM CBC if patient stays overnight Hypothyroidism TSH of 11.6 on arrival lab workup; subsequent T4 WNL at 0.79.  T3 63.  Initiated on levothyroxine  100 mcg 5/16.  - Continue levothyroxine  100 mcg - follow up TSH in 4-6 weeks Chronic health problem Chronic pain, knees: Continue home lidocaine  patch PRN, gabapentin  100 mg twice daily Nonsustained VT: Continue home amiodarone  200 mg daily HFrEF, ischemic cardiomyopathy: Continue home digoxin  0.125 daily; hold Jardiance  given AKI; holding Entresto , spironolactone , torsemide  given soft pressures, restart as indicated. HLD: Continue  home Zetia  10 mg daily GERD: Continue pantoprazole  40 mg daily T2DM: sSSI ordered; holding home metformin  HTN: Continue holding home spironolactone , torsemide  for normotensive pressures; restart as indicated  FEN/GI: Heart healthy PPx: Eliquis  Dispo:Home pending ultrasound result.    Subjective:  Overall well, continues to have intermittent dizziness.  Objective: Temp:  [97.8 F (36.6 C)-98.2 F (36.8 C)] 98.2 F (36.8 C) (05/20 1202) Pulse Rate:  [54-64] 64 (05/20 0843) Resp:  [15-17] 16 (05/20 1202) BP: (90-104)/(61-79) 102/79 (05/20 1202) SpO2:  [96 %-99 %] 99 % (05/20 1202) Physical Exam: General: Well-appearing elderly gentleman no distress. Cardiovascular: RRR, no M/R/G Respiratory: CTAB, no increased work of breathing Abdomen: Flat, soft, nontender Extremities: No evidence of peripheral edema, 2+ pulses  Laboratory: Most recent CBC Lab Results  Component Value Date   WBC 4.4 03/17/2024   HGB 11.8 (L) 03/17/2024   HCT 36.8 (L) 03/17/2024   MCV 81.4 03/17/2024   PLT 210 03/17/2024   Most recent BMP    Latest Ref Rng & Units 03/17/2024    6:22 AM  BMP  Glucose 70 - 99 mg/dL 595   BUN 8 - 23 mg/dL 12   Creatinine 6.38 - 1.24 mg/dL 7.56   Sodium 433 - 295 mmol/L 139   Potassium 3.5 - 5.1 mmol/L 3.9   Chloride 98 - 111 mmol/L 109   CO2 22 - 32 mmol/L 24   Calcium  8.9 - 10.3 mg/dL 8.5     Rayma Calandra, DO 03/17/2024, 12:31 PM  PGY-1, [facility] Family Medicine FPTS Intern pager: [phone], text pages welcome Secure chat group [facility] Teaching Service

## 2024-03-17 NOTE — Progress Notes (Signed)
 Chief Complaint: Patient was seen today for intra cranial carotid stenosis   Referring Physician(s): Albin Huh, MD  Supervising Physician: Luellen Sages  Patient Status: [facility] - In-pt  Subjective: 5/20: Patient is one day post diagnostic cerebral arteriogram. Patient reports pain at the right groin site, which is rated an 8/10 on the pain scale. Endorses: continued dizziness. Denies: headaches, vomiting, vision changes, chest pain, shortness of breath.  Objective: Physical Exam: BP 99/72 (BP Location: Left Arm)   Pulse 64   Temp 98.2 F (36.8 C) (Oral)   Resp 16   Ht 5\' 6"  (1.676 m)   Wt 150 lb (68 kg)   SpO2 96%   BMI 24.21 kg/m  Constitutional: adult male, alert, sitting in bed, INAD Skin: warm and dry. Right groin site is soft and without hematoma formation. No bleeding or ecchymosis. TTP.  Chest: symmetric rise and fall of chest. Pulm: breathing comfortably on room air. No evidence of respiratory distress. Neuro: alert and oriented. No facial asymmetry. Fluent speech. Able to move extremities x 4.  Psych: normal insight and judgement.   Current Facility-Administered Medications:    acetaminophen  (TYLENOL ) tablet 650 mg, 650 mg, Oral, Q6H PRN, 650 mg at 03/16/24 2048 **OR** acetaminophen  (TYLENOL ) suppository 650 mg, 650 mg, Rectal, Q6H PRN, Spence, Sarah, DO   amiodarone  (PACERONE ) tablet 200 mg, 200 mg, Oral, Daily, Spence, Sarah, DO, 200 mg at 03/17/24 1610   digoxin  (LANOXIN ) tablet 0.125 mg, 0.125 mg, Oral, Daily, Omar Bibber, DO, 0.125 mg at 03/17/24 9604   empagliflozin  (JARDIANCE ) tablet 10 mg, 10 mg, Oral, Daily, Omar Bibber, DO, 10 mg at 03/17/24 5409   ezetimibe  (ZETIA ) tablet 10 mg, 10 mg, Oral, Daily, Omar Bibber, DO, 10 mg at 03/17/24 8119   gabapentin  (NEURONTIN ) capsule 100 mg, 100 mg, Oral, BID, Spence, Sarah, DO, 100 mg at 03/17/24 0844   heparin  injection 5,000 Units, 5,000 Units, Subcutaneous, Q8H, Clem Currier, DO, 5,000 Units at  03/17/24 0501   insulin  aspart (novoLOG ) injection 0-9 Units, 0-9 Units, Subcutaneous, TID WC, Omar Bibber, DO, 1 Units at 03/17/24 1478   iohexol  (OMNIPAQUE ) 300 MG/ML solution 100 mL, 100 mL, Intra-arterial, Once PRN, Deveshwar, Sanjeev, MD   iohexol  (OMNIPAQUE ) 300 MG/ML solution 150 mL, 150 mL, Intra-arterial, Once PRN, Deveshwar, Sanjeev, MD   levothyroxine  (SYNTHROID ) tablet 100 mcg, 100 mcg, Oral, Q0600, Rayma Calandra, DO, 100 mcg at 03/17/24 0501   lidocaine  (LIDODERM ) 5 % 1 patch, 1 patch, Transdermal, Q24H, Spence, Sarah, DO, 1 patch at 03/17/24 2956   lidocaine  (PF) (XYLOCAINE ) 1 % injection 20 mL, 20 mL, Other, Once, Eniola, Kehinde T, MD   pantoprazole  (PROTONIX ) EC tablet 40 mg, 40 mg, Oral, Daily, Spence, Sarah, DO, 40 mg at 03/17/24 2130   potassium chloride  SA (KLOR-CON  M) CR tablet 20 mEq, 20 mEq, Oral, Once, Rayma Calandra, DO  Labs: CBC Recent Labs    03/16/24 0618 03/17/24 0622  WBC 4.8 4.4  HGB 12.3* 11.8*  HCT 38.4* 36.8*  PLT 229 210   BMET Recent Labs    03/16/24 0618 03/17/24 0622  NA 139 139  K 3.8 3.9  CL 106 109  CO2 26 24  GLUCOSE 105* 139*  BUN 11 12  CREATININE 1.01 1.11  CALCIUM  8.9 8.5*   LFT No results for input(s): "PROT", "ALBUMIN", "AST", "ALT", "ALKPHOS", "BILITOT", "BILIDIR", "IBILI", "LIPASE" in the last 72 hours. PT/INR Recent Labs    03/16/24 0022  LABPROT 16.6*  INR 1.3*  Studies/Results: IR ANGIO INTRA EXTRACRAN SEL COM CAROTID INNOMINATE BILAT MOD SED Result Date: 03/17/2024 CLINICAL DATA:  Symptoms of dizziness and vertigo. Suspicion of high-grade stenosis of the intracranial internal carotid arteries. EXAM: BILATERAL COMMON CAROTID AND INNOMINATE ANGIOGRAPHY COMPARISON:  CT angiogram of the head and neck of 03/12/2024. MEDICATIONS: Heparin  1000 units IV; no antibiotic was administered within 1 hour of the procedure. ANESTHESIA/SEDATION: Versed  0 mg IV; Fentanyl  25 mcg IV, and Benadryl  25 mg IV. Moderate Sedation  Time:  47 minutes The patient was continuously monitored during the procedure by the interventional radiology nurse under my direct supervision. CONTRAST:  Omnipaque  300 approximately 65 mL. FLUOROSCOPY TIME:  Fluoroscopy Time: 17 minutes 42 seconds (398 mGy). COMPLICATIONS: None immediate. TECHNIQUE: Informed written consent was obtained from the patient after a thorough discussion of the procedural risks, benefits and alternatives. All questions were addressed. Maximal Sterile Barrier Technique was utilized including caps, mask, sterile gowns, sterile gloves, sterile drape, hand hygiene and skin antiseptic. A timeout was performed prior to the initiation of the procedure. The right groin was prepped and draped in the usual sterile fashion. Thereafter using modified Seldinger technique, transfemoral access into the right common femoral artery was obtained without difficulty. Over an 0.035 inch guidewire, a 5 French Pinnacle sheath was inserted. Through this, and also over an 0.035 inch guidewire, a 5 [place] JB 1 catheter was advanced to the aortic arch region and selectively positioned in the right common carotid artery, the right vertebral artery, the left common carotid artery and the left vertebral artery. FINDINGS: The innominate arteriogram demonstrates mild stenosis of the proximal innominate artery. The proximal right subclavian artery and the right common carotid artery demonstrate wide patency. The right common carotid arteriogram demonstrates the right external carotid artery and its major branches to be widely patent. The right common carotid artery at the bulb demonstrates a small ulcerated plaque along its medial wall. Distal to this the right internal carotid artery opacifies normally to the cranial skull base. The petrous, the cavernous and the supraclinoid right ICA are widely patent. The right middle cerebral artery and the right anterior cerebral artery opacify into the capillary and venous  phases. The proximal right vertebral artery demonstrates an approximately 20% stenosis just distal to its origin. The vessel opacifies to the right vertebrobasilar junction including a hypoplastic right posterior-inferior cerebellar artery. The opacified portion of the basilar artery, the posterior cerebral arteries, the superior cerebellar arteries and of the anterior inferior cerebellar arteries demonstrates patency into the capillary and venous phases. Unopacified blood is seen in the basilar artery from the contralateral vertebral artery. Suggestion of a right anterior inferior cerebellar artery/posteroinferior cerebellar artery complex is evident. The left subclavian arteriogram demonstrates mild stenosis proximally. The left vertebral artery demonstrates wide patency with moderate tortuosity just distal to its origin. Vessel opacifies to the cranial skull base. The left vertebrobasilar junction and the left posterior-inferior cerebellar artery demonstrate wide patency. The opacified portions of the basilar artery, the posterior cerebral arteries, the superior cerebellar and of the anterior-inferior cerebellar arteries demonstrate patency. Unopacified blood is seen in the basilar artery and distally from the contralateral vertebral artery. The left common carotid arteriogram demonstrates the left external carotid artery and its major branches to be widely patent. The left internal carotid artery at the bulb to the cranial skull base is widely patent. Wide patency is maintained of the petrous, the cavernous and the supraclinoid left ICA. The left middle and the left anterior cerebral artery opacify into  the capillary and venous phases. Suggestion of a mild focal stenosis is seen of a proximal branch of the superior division of the left middle cerebral artery. IMPRESSION: Wide patency of the supraclinoid ICAs bilaterally intracranially. Approximately 20% stenosis of the proximal right vertebral artery due to  eccentric atherosclerotic plaque. Suspicion of a mild stenosis of a proximal branch of the superior division of the left middle cerebral artery. PLAN: As per referring MD. Electronically Signed   By: Luellen Sages M.D.   On: 03/17/2024 08:15   IR ANGIO VERTEBRAL SEL VERTEBRAL BILAT MOD SED Result Date: 03/17/2024 CLINICAL DATA:  Symptoms of dizziness and vertigo. Suspicion of high-grade stenosis of the intracranial internal carotid arteries. EXAM: BILATERAL COMMON CAROTID AND INNOMINATE ANGIOGRAPHY COMPARISON:  CT angiogram of the head and neck of 03/12/2024. MEDICATIONS: Heparin  1000 units IV; no antibiotic was administered within 1 hour of the procedure. ANESTHESIA/SEDATION: Versed  0 mg IV; Fentanyl  25 mcg IV, and Benadryl  25 mg IV. Moderate Sedation Time:  47 minutes The patient was continuously monitored during the procedure by the interventional radiology nurse under my direct supervision. CONTRAST:  Omnipaque  300 approximately 65 mL. FLUOROSCOPY TIME:  Fluoroscopy Time: 17 minutes 42 seconds (398 mGy). COMPLICATIONS: None immediate. TECHNIQUE: Informed written consent was obtained from the patient after a thorough discussion of the procedural risks, benefits and alternatives. All questions were addressed. Maximal Sterile Barrier Technique was utilized including caps, mask, sterile gowns, sterile gloves, sterile drape, hand hygiene and skin antiseptic. A timeout was performed prior to the initiation of the procedure. The right groin was prepped and draped in the usual sterile fashion. Thereafter using modified Seldinger technique, transfemoral access into the right common femoral artery was obtained without difficulty. Over an 0.035 inch guidewire, a 5 French Pinnacle sheath was inserted. Through this, and also over an 0.035 inch guidewire, a 5 [place] JB 1 catheter was advanced to the aortic arch region and selectively positioned in the right common carotid artery, the right vertebral artery, the left  common carotid artery and the left vertebral artery. FINDINGS: The innominate arteriogram demonstrates mild stenosis of the proximal innominate artery. The proximal right subclavian artery and the right common carotid artery demonstrate wide patency. The right common carotid arteriogram demonstrates the right external carotid artery and its major branches to be widely patent. The right common carotid artery at the bulb demonstrates a small ulcerated plaque along its medial wall. Distal to this the right internal carotid artery opacifies normally to the cranial skull base. The petrous, the cavernous and the supraclinoid right ICA are widely patent. The right middle cerebral artery and the right anterior cerebral artery opacify into the capillary and venous phases. The proximal right vertebral artery demonstrates an approximately 20% stenosis just distal to its origin. The vessel opacifies to the right vertebrobasilar junction including a hypoplastic right posterior-inferior cerebellar artery. The opacified portion of the basilar artery, the posterior cerebral arteries, the superior cerebellar arteries and of the anterior inferior cerebellar arteries demonstrates patency into the capillary and venous phases. Unopacified blood is seen in the basilar artery from the contralateral vertebral artery. Suggestion of a right anterior inferior cerebellar artery/posteroinferior cerebellar artery complex is evident. The left subclavian arteriogram demonstrates mild stenosis proximally. The left vertebral artery demonstrates wide patency with moderate tortuosity just distal to its origin. Vessel opacifies to the cranial skull base. The left vertebrobasilar junction and the left posterior-inferior cerebellar artery demonstrate wide patency. The opacified portions of the basilar artery, the posterior cerebral arteries,  the superior cerebellar and of the anterior-inferior cerebellar arteries demonstrate patency. Unopacified blood is  seen in the basilar artery and distally from the contralateral vertebral artery. The left common carotid arteriogram demonstrates the left external carotid artery and its major branches to be widely patent. The left internal carotid artery at the bulb to the cranial skull base is widely patent. Wide patency is maintained of the petrous, the cavernous and the supraclinoid left ICA. The left middle and the left anterior cerebral artery opacify into the capillary and venous phases. Suggestion of a mild focal stenosis is seen of a proximal branch of the superior division of the left middle cerebral artery. IMPRESSION: Wide patency of the supraclinoid ICAs bilaterally intracranially. Approximately 20% stenosis of the proximal right vertebral artery due to eccentric atherosclerotic plaque. Suspicion of a mild stenosis of a proximal branch of the superior division of the left middle cerebral artery. PLAN: As per referring MD. Electronically Signed   By: Luellen Sages M.D.   On: 03/17/2024 08:15    Assessment/Plan: intra cranial carotid stenosis   Patient is 1 day post diagnostic cerebral arteriogram.  Patient reports 8/10 pain at right groin site. Ultrasound ordered for further evaluation. Further recommendations pending ultrasound. Primary team notified.  Please contact IR with questions/concerns.    LOS: 5 days   I spent a total of 15 minutes in face to face in clinical consultation, greater than 50% of which was counseling/coordinating care for intra cranial carotid stenosis   Neomia Banner Porshia Blizzard PA-C 03/17/2024 9:43 AM

## 2024-03-17 NOTE — TOC Initial Note (Signed)
 Transition of Care [facility]) - Initial/Assessment Note    Patient Details  Name: Daniel Li MRN: [id number] Date of Birth: 05-11-1956  [facility] CM/SW Contact:    Cosimo Diones, RN Phone Number: 03/17/2024, 2:40 PM  Clinical Narrative: Patient presented for near syncopal episode. PTA patient was from [facility] in [place]. Patient has discharge orders and states he will need services arranged at the ALF. Case Manager called owner Normie Becton and he states he has not agency preference. Referral submitted to [facility] and start of care to begin within 24-48 hours post transition home. CSW to submit the ALF to [phone] and she will arrange transport via [facility]. No further needs identified at this time.                    Expected Discharge Plan: Home w Home Health Services Barriers to Discharge: No Barriers Identified   Patient Goals and CMS Choice Patient states their goals for this hospitalization and ongoing recovery are:: plan to return to ALF   Choice offered to / list presented to :  (Owner of the facility does not have a preference for agency.)      Expected Discharge Plan and Services In-house Referral: Clinical Social Work Discharge Planning Services: CM Consult Post Acute Care Choice: Home Health Living arrangements for the past 2 months: Assisted Living Facility Expected Discharge Date: 03/17/24                         HH Arranged: OT, PT HH Agency: [facility] Date [facility] Agency Contacted: 03/17/24 Time HH Agency Contacted: 1439 Representative spoke with at [facility] Agency: Loetta Ringer  Prior Living Arrangements/Services Living arrangements for the past 2 months: Assisted Living Facility Lives with:: Facility Resident Patient language and need for interpreter reviewed:: Yes Do you feel safe going back to the place where you live?: Yes      Need for Family Participation in Patient Care: Yes (Comment) Care giver support  system in place?: Yes (comment)   Criminal Activity/Legal Involvement Pertinent to Current Situation/Hospitalization: No - Comment as needed  Activities of Daily Living   ADL Screening (condition at time of admission) Independently performs ADLs?: Yes (appropriate for developmental age) Is the patient deaf or have difficulty hearing?: No Does the patient have difficulty seeing, even when wearing glasses/contacts?: No Does the patient have difficulty concentrating, remembering, or making decisions?: No  Permission Sought/Granted Permission sought to share information with : Family Supports, Magazine features editor, Case Estate manager/land agent granted to share information with : Yes, Verbal Permission Granted     Permission granted to share info w AGENCY: [facility]        Emotional Assessment Appearance:: Appears stated age Attitude/Demeanor/Rapport: Engaged Affect (typically observed): Appropriate Orientation: : Oriented to Self, Oriented to Place, Oriented to  Time, Oriented to Situation Alcohol / Substance Use: Not Applicable Psych Involvement: No (comment)  Admission diagnosis:  Pre-syncope [R55] Left leg weakness [R29.898] Left arm weakness [R29.898] AKI (acute kidney injury) (HCC) [N17.9] Near syncope [R55] Left sided numbness [R20.0] Hypotension due to hypovolemia [E86.1] Patient Active Problem List   Diagnosis Date Noted   Anemia 03/14/2024   Hypotension due to hypovolemia 03/14/2024   Hypothyroidism 03/13/2024   History of CVA with residual deficit 03/13/2024   Internal carotid artery occlusion 03/13/2024   Near syncope  Dizziness 03/12/2024   Chronic health problem 03/12/2024   AAA (abdominal aortic aneurysm)  without rupture (HCC) 03/03/2024   Moderate mitral insufficiency 08/07/2023   Cocaine positive 12/09/2022   Mitral regurgitation 03/22/2022   CAD in native artery 08/23/2020   Unstable angina (HCC) 08/22/2020   Status post coronary  artery stent placement    Hyponatremia    Gastroesophageal reflux disease    Dehydration    Acute on chronic renal failure (HCC) 05/25/2018   Abdominal pain, acute, epigastric 05/17/2018   Hernia of abdominal wall 05/17/2018   Presumed Gastritis 05/17/2018   Generalized abdominal pain    Elevated troponin    Chest pain 04/29/2018   ICD (implantable cardioverter-defibrillator) in place 03/05/2018   Anticoagulated on Coumadin  03/05/2018   Non-STEMI (non-ST elevated myocardial infarction) (HCC) 02/12/2018   Precordial chest pain 02/11/2018   Chronic systolic CHF (congestive heart failure) (HCC) 02/11/2018   Chronic combined systolic (congestive) and diastolic (congestive) heart failure (HCC)    STEMI (ST elevation myocardial infarction) (HCC) 02/21/2017   Ischemic cardiomyopathy 07/20/2014   High risk medication use 01/28/2013   Special screening for malignant neoplasms, colon 01/28/2013   Hypercholesterolemia    Tobacco abuse    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 [place] [address] Phone: [phone] Fax: [phone]  [facility] [address] [place] [address] Phone: [phone] Fax: [phone]     Social Drivers of Health (SDOH) Social History: SDOH Screenings   Food Insecurity: No Food Insecurity (03/13/2024)  Housing: Low Risk  (03/13/2024)  Transportation Needs: No Transportation Needs (03/13/2024)  Recent Concern: Transportation Needs - Unmet Transportation Needs (01/22/2024)  Utilities: Not At Risk (03/13/2024)  Depression (PHQ2-9): Low Risk  (11/05/2023)  Financial Resource Strain: Low Risk  (02/25/2023)   Received from [facility], [facility]  Social Connections: Socially Isolated (03/13/2024)  Tobacco  Use: Medium Risk (03/12/2024)   SDOH Interventions:     Readmission Risk Interventions     No data to display

## 2024-03-17 NOTE — Progress Notes (Signed)
 Discharge instructions (including medications) discussed with and copy provided to patient/caregiver

## 2024-03-17 NOTE — Assessment & Plan Note (Signed)
 Dizziness still present but improved. - PT/OT following, requested vestibular dysfunction testing - encourage PO intake - Fall precautions

## 2024-03-17 NOTE — Assessment & Plan Note (Signed)
 Hemoglobin 11.8, patient having groin ultrasound today, may represent small amount of bleeding. - AM CBC if patient stays overnight

## 2024-03-17 NOTE — Assessment & Plan Note (Signed)
 Patient tolerated procedure yesterday well but is having some increased groin pain this morning. - Restart home Eliquis  -IR will complete ultrasound of groin to assess for hematoma, if cleared patient will be stable for discharge

## 2024-03-17 NOTE — Discharge Summary (Signed)
 Family Medicine Teaching [facility] Discharge Summary  Patient name: Daniel Li Medical record number: [id number] Date of birth: 1956-02-08 Age: 68 y.o. Gender: male Date of Admission: 03/12/2024  Date of Discharge: 03/17/24 Admitting Physician: Omar Bibber, DO  Primary Care Provider: Orlena Bitters, MD Consultants: Interventional Radiology  Indication for Hospitalization: Near Syncope  Discharge Diagnoses/Problem List:  Principal Problem for Admission: Near Syncope Other Problems addressed during stay:  Principal Problem:   Near syncope  Dizziness Active Problems:   Dehydration   Chronic health problem   Hypothyroidism   History of CVA with residual deficit   Internal carotid artery occlusion   Anemia   Hypotension due to hypovolemia    Brief Hospital Course:  Daniel Li is a 68 y.o.male with a history of HFrEF, ischemic cardiomyopathy, T2DM, GERD, chronic pain, nonsustained VT, HDL who was admitted to the [facility] Medicine Teaching Service at [facility] for acute on chronic dizziness.   His hospital course is detailed below:  Near syncope  Dizziness Patient presented to the ED from his outpatient electrophysiology appointment this morning for concerns of dizziness/weakness/left-sided deficits.  Imaging workup including CT head, CTA head and neck, MRI brain without acute abnormality or evidence of large vessel occlusion.  Labs grossly unremarkable.  Neurology was consulted and did not believe this to be an acute stroke.  On further conversation with the patient it was elicited that the left-sided deficits (weakness, numbness) are residual from prior stroke and on presentation are unchanged from his baseline.  He did have new dizziness and blurring of his vision.  Had vestibular evaluation with PT with mild improvement of symptoms.  Orthostatic vital signs remained appropriate throughout mission.  Patient's TSH was elevated to 11.6, and T3 was low but free T4 was  high.  Patient was initiated on 100 mcg of levothyroxine  per weight-based dosing protocols.  Recommendations for outpatient follow-up below.  AKI Creatinine 1.28 on admission.  Patient received 1 L LR fluid bolus in the ED. Cr improved after fluids were given.  Bilateral ICA stenosis Noted on CTA head and neck on admission.  Patient underwent evaluation with interventional radiology which showed no significant blockage or stenosis.  Patient was continued on apixaban  for anticoagulation and Zetia  for hyperlipidemia, however given patient's untreated hypothyroidism as noted above it would be reasonable to retry statin therapy once TSH has normalized.   Other chronic conditions were medically managed with home medications and formulary alternatives as necessary (HFrEF, HLD, GERD, T2DM, Nonsustained VT, chronic knee pain)     PCP Follow-up Recommendations: Follow up TSH in 4 weeks Zetia  could be a possible culprit in dizziness; consider switch to alternate medication Consider re-trial of statin therapy once hypothyroidism is corrected.    Disposition: Home  Discharge Condition: Improved  Discharge Exam:  Vitals:   03/17/24 0843 03/17/24 1202  BP:  102/79  Pulse: 64   Resp:  16  Temp:  98.2 F (36.8 C)  SpO2:  99%   General: Well-appearing, no distress Cardiac: RRR, no M/R/G Respiratory: CTAB, no increased work of breathing Extremities: 2+ peripheral pulses bilaterally, no evidence of peripheral edema  Significant Procedures:   Diagnostic intracranial carotid angiogram IMPRESSION: Wide patency of the supraclinoid ICAs bilaterally intracranially.   Approximately 20% stenosis of the proximal right vertebral artery due to eccentric atherosclerotic plaque.   Suspicion of a mild stenosis of a proximal branch of the superior division of the left middle cerebral artery.   PLAN: As per referring MD.  Significant Labs and Imaging:  Recent Labs  Lab 03/16/24 0022  03/16/24 0618 03/17/24 0622  WBC 4.7 4.8 4.4  HGB 12.5* 12.3* 11.8*  HCT 38.7* 38.4* 36.8*  PLT 223 229 210   Recent Labs  Lab 03/16/24 0618 03/17/24 0622  NA 139 139  K 3.8 3.9  CL 106 109  CO2 26 24  GLUCOSE 105* 139*  BUN 11 12  CREATININE 1.01 1.11  CALCIUM  8.9 8.5*  MG 2.1 2.1   CT perfusion brain IMPRESSION: 1. CTA is negative for large vessel occlusion, but Positive for SEVERE BILATERAL supraclinoid ICA stenosis due to bulky calcified plaque.   2. CTP: Suspect artifact in the chronic Right MCA infarct. No oligemia detected.   3. Furthermore, up to Moderate stenosis of both vertebral artery origins, greater on the left.   4.  Aortic Atherosclerosis (ICD10-I70.0).   #1, #2 were communicated to Dr. Bonnita Buttner at 1256 hours on 03/12/2024 by text page via the [facility] messaging system.  CT head IMPRESSION: 1. Stable non contrast CT appearance of chronic bilateral MCA infarcts, asymmetric white matter disease. 2. ASPECTS 10. No acute cortically based infarct or acute intracranial hemorrhage identified. 3. These results were communicated to Dr. Arora at 12:36 pm on 03/12/2024 by text page via the [facility] messaging system.  Results/Tests Pending at Time of Discharge: None  Discharge Medications:  Allergies as of 03/17/2024       Reactions   Pork-derived Products         Medication List     PAUSE taking these medications    empagliflozin  10 MG Tabs tablet Wait to take this until your doctor or other care provider tells you to start again. Commonly known as: JARDIANCE  Take 10 mg by mouth daily.   Entresto  24-26 MG Wait to take this until your doctor or other care provider tells you to start again. Generic drug: sacubitril -valsartan  TAKE (1) TABLET BY MOUTH TWICE DAILY.   spironolactone  25 MG tablet Wait to take this until your doctor or other care provider tells you to start again. Commonly known as: ALDACTONE  Take 1 tablet (25 mg total) by mouth daily.    torsemide  20 MG tablet Wait to take this until your doctor or other care provider tells you to start again. Commonly known as: DEMADEX  TAKE (1) TABLET BY MOUTH ONCE DAILY.       TAKE these medications    acetaminophen  325 MG tablet Commonly known as: TYLENOL  Take 650 mg by mouth every 6 (six) hours as needed for mild pain.   albuterol  108 (90 Base) MCG/ACT inhaler Commonly known as: VENTOLIN  HFA Inhale 2 puffs into the lungs every 4 (four) hours as needed for wheezing or shortness of breath.   amiodarone  200 MG tablet Commonly known as: PACERONE  Take 1 tablet (200 mg total) by mouth daily.   digoxin  0.125 MG tablet Commonly known as: LANOXIN  TAKE (1) TABLET BY MOUTH ONCE DAILY.   Eliquis  5 MG Tabs tablet Generic drug: apixaban  TAKE (1) TABLET BY MOUTH TWICE DAILY. What changed: See the new instructions.   ezetimibe  10 MG tablet Commonly known as: Zetia  Take 1 tablet (10 mg total) by mouth daily.   gabapentin  100 MG capsule Commonly known as: NEURONTIN  Take 100 mg by mouth 2 (two) times daily.   levothyroxine  100 MCG tablet Commonly known as: SYNTHROID  Take 1 tablet (100 mcg total) by mouth daily at 6 (six) AM. Start taking on: Mar 18, 2024   lidocaine  5 % Commonly known  as: LIDODERM  Place 1 patch onto the skin every 12 (twelve) hours. Leave off 12 hours   metFORMIN  500 MG tablet Commonly known as: GLUCOPHAGE  Take 500 mg by mouth daily with breakfast.   nitroGLYCERIN  0.4 MG SL tablet Commonly known as: NITROSTAT  PLACE ONE (1) TABLET UNDER TONGUE EVERY 5 MINUTES UP TO (3) DOSES AS NEEDED FOR CHEST PAIN. IF NO RELIEF, CONTACT MD.   pantoprazole  40 MG tablet Commonly known as: PROTONIX  Take 40 mg by mouth daily.   potassium chloride  10 MEQ tablet Commonly known as: KLOR-CON  Take 2 tablets (20 mEq total) by mouth daily.        Discharge Instructions: Please refer to Patient Instructions section of EMR for full details.  Patient was counseled important  signs and symptoms that should prompt return to medical care, changes in medications, dietary instructions, activity restrictions, and follow up appointments.   Follow-Up Appointments: Please schedule follow-up with PCP within 1 week Schedule specialist follow-ups as indicated.  Rayma Calandra, DO 03/17/2024, 1:57 PM PGY-1, [facility] Family Medicine

## 2024-03-17 NOTE — Plan of Care (Signed)
  Problem: Activity: Goal: Risk for activity intolerance will decrease Outcome: Progressing   Problem: Pain Managment: Goal: General experience of comfort will improve and/or be controlled Outcome: Progressing   Problem: Safety: Goal: Ability to remain free from injury will improve Outcome: Progressing   Problem: Skin Integrity: Goal: Risk for impaired skin integrity will decrease Outcome: Progressing

## 2024-03-17 NOTE — Progress Notes (Signed)
 Physical Therapy Treatment Patient Details Name: Daniel Li MRN: [id number] DOB: August 29, 1956 Today's Date: 03/17/2024   History of Present Illness 68 y.o. male admitted 5/15 with left sided weakness and dizziness. 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  Working diagnosis is hypotensive exacerbation of ischemia in prior right anterior cerebral infarctions or chronic residual deficits from prior strokes.                                  PMH: combined CHF, hyperlipidemia, PE on Eliquis , hypertension, hyperlipidemia, prior strokes with baseline left-sided weakness.    PT Comments  Patient resting in bed at start of session and agreeable to mobilize with therapy, reports dizziness no longer severe today. Pt performing bed mobility at mod ind level and agreeable to progress ambulation today. CGA/supervision for sit<>stand with RW and pt steady in standing, denies dizziness. Pt required CGA/supervision with RW for ~120' ambulation. Brief screening for central vertigo completed on return to room and pt noted to have difficulty with smooth pursuits and saccades. Positional testing deferred to OP as pt's symptoms improved. Will continue to progress pt as able during acute stay.    If plan is discharge home, recommend the following: Assistance with cooking/housework;Assist for transportation;Help with stairs or ramp for entrance   Can travel by private vehicle        Equipment Recommendations  None recommended by PT    Recommendations for Other Services       Precautions / Restrictions Precautions Precautions: Fall Recall of Precautions/Restrictions: Intact Restrictions Weight Bearing Restrictions Per Provider Order: No     Mobility  Bed Mobility Overal bed mobility: Modified Independent             General bed mobility comments: mod I with bed mobility.    Transfers Overall transfer level: Needs assistance Equipment used:  Rolling walker (2 wheels), 1 person hand held assist Transfers: Sit to/from Stand, Bed to chair/wheelchair/BSC Sit to Stand: Supervision, Contact guard assist           General transfer comment: sup/CGA from EOB with RW.    Ambulation/Gait Ambulation/Gait assistance: Contact guard assist Gait Distance (Feet): 120 Feet Assistive device: Rolling walker (2 wheels) Gait Pattern/deviations: Step-through pattern, Decreased stride length Gait velocity: dec     General Gait Details: mildly unsteady, no overt LOB, Rt LE flexing occasionally.   Stairs             Wheelchair Mobility     Tilt Bed    Modified Rankin (Stroke Patients Only)       Balance Overall balance assessment: Mild deficits observed, not formally tested                                          Communication Communication Communication: Impaired Factors Affecting Communication: Reduced clarity of speech  Cognition Arousal: Alert Behavior During Therapy: WFL for tasks assessed/performed   PT - Cognitive impairments: No apparent impairments                         Following commands: Intact      Cueing Cueing Techniques: Verbal cues  Exercises      General Comments        Pertinent Vitals/Pain Pain Assessment Pain Assessment: No/denies pain  Home Living                          Prior Function            PT Goals (current goals can now be found in the care plan section) Acute Rehab PT Goals Patient Stated Goal: to go home PT Goal Formulation: With patient Time For Goal Achievement: 03/27/24 Potential to Achieve Goals: Good Progress towards PT goals: Progressing toward goals    Frequency    Min 2X/week      PT Plan      Co-evaluation              AM-PAC PT "6 Clicks" Mobility   Outcome Measure  Help needed turning from your back to your side while in a flat bed without using bedrails?: None Help needed moving from lying  on your back to sitting on the side of a flat bed without using bedrails?: None Help needed moving to and from a bed to a chair (including a wheelchair)?: A Little Help needed standing up from a chair using your arms (e.g., wheelchair or bedside chair)?: A Little Help needed to walk in hospital room?: A Little Help needed climbing 3-5 steps with a railing? : Total 6 Click Score: 18    End of Session Equipment Utilized During Treatment: Gait belt Activity Tolerance: Patient tolerated treatment well Patient left: in bed;with call bell/phone within reach;with bed alarm set Nurse Communication: Mobility status PT Visit Diagnosis: Dizziness and giddiness (R42);BPPV;Unsteadiness on feet (R26.81);Muscle weakness (generalized) (M62.81) BPPV - Right/Left : Right     Time: 1914-7829 PT Time Calculation (min) (ACUTE ONLY): 16 min  Charges:    $Therapeutic Activity: 8-22 mins PT General Charges $$ ACUTE PT VISIT: 1 Visit                     Tish Forge, DPT Acute Rehabilitation Services Office [phone]  03/17/24 4:40 PM

## 2024-03-17 NOTE — Progress Notes (Signed)
 IR progress note.  Right groin ultrasound preliminary result does not show evidence of DVT, AVF or pseudoaneurysm. Primary team and Dr. Alvira Josephs notified. Patient ok for discharge from [facility] standpoint.

## 2024-03-18 NOTE — Progress Notes (Addendum)
 [facility] Liaison Note  03/18/2024  Daniel Li [birth year] [id number]  Location: RN Hospital Liaison screened the patient remotely at [facility].  Insurance: [employer]   [name] is a 68 y.o. male who is a Primary Care Patient of Vyas, Veneda Gift, MD [facility] Internal Medicine. The patient was screened for  readmission hospitalization with noted extreme risk score for unplanned readmission risk with 2 IP/1 ED in 6 months.  The patient was assessed for potential Care Management service needs for post hospital transition for care coordination. Review of patient's electronic medical record reveals patient was admitted for Near syncope/Dizziness. Pt discharged to ALF. No VBCI needs at this time.    VBCI Care Management/Population Health does not replace or interfere with any arrangements made by the Inpatient Transition of Care team.   For questions contact:   Lilla Reichert, RN, BSN Hospital Liaison [place]   [facility], Population Health Office Hours MTWF  8:00 am-6:00 pm Direct Dial: [phone] mobile [email or web address]

## 2024-03-20 DIAGNOSIS — F17210 Nicotine dependence, cigarettes, uncomplicated: Secondary | ICD-10-CM | POA: Diagnosis not present

## 2024-03-20 DIAGNOSIS — N179 Acute kidney failure, unspecified: Secondary | ICD-10-CM | POA: Diagnosis not present

## 2024-03-20 DIAGNOSIS — E785 Hyperlipidemia, unspecified: Secondary | ICD-10-CM | POA: Diagnosis not present

## 2024-03-20 DIAGNOSIS — I255 Ischemic cardiomyopathy: Secondary | ICD-10-CM | POA: Diagnosis not present

## 2024-03-20 DIAGNOSIS — I4729 Other ventricular tachycardia: Secondary | ICD-10-CM | POA: Diagnosis not present

## 2024-03-20 DIAGNOSIS — D649 Anemia, unspecified: Secondary | ICD-10-CM | POA: Diagnosis not present

## 2024-03-20 DIAGNOSIS — E039 Hypothyroidism, unspecified: Secondary | ICD-10-CM | POA: Diagnosis not present

## 2024-03-20 DIAGNOSIS — I6523 Occlusion and stenosis of bilateral carotid arteries: Secondary | ICD-10-CM | POA: Diagnosis not present

## 2024-03-20 DIAGNOSIS — I110 Hypertensive heart disease with heart failure: Secondary | ICD-10-CM | POA: Diagnosis not present

## 2024-03-20 DIAGNOSIS — M25562 Pain in left knee: Secondary | ICD-10-CM | POA: Diagnosis not present

## 2024-03-20 DIAGNOSIS — Z7901 Long term (current) use of anticoagulants: Secondary | ICD-10-CM | POA: Diagnosis not present

## 2024-03-20 DIAGNOSIS — G8929 Other chronic pain: Secondary | ICD-10-CM | POA: Diagnosis not present

## 2024-03-20 DIAGNOSIS — Z556 Problems related to health literacy: Secondary | ICD-10-CM | POA: Diagnosis not present

## 2024-03-20 DIAGNOSIS — Z604 Social exclusion and rejection: Secondary | ICD-10-CM | POA: Diagnosis not present

## 2024-03-20 DIAGNOSIS — M25561 Pain in right knee: Secondary | ICD-10-CM | POA: Diagnosis not present

## 2024-03-20 DIAGNOSIS — I69392 Facial weakness following cerebral infarction: Secondary | ICD-10-CM | POA: Diagnosis not present

## 2024-03-20 DIAGNOSIS — I2510 Atherosclerotic heart disease of native coronary artery without angina pectoris: Secondary | ICD-10-CM | POA: Diagnosis not present

## 2024-03-20 DIAGNOSIS — Z7984 Long term (current) use of oral hypoglycemic drugs: Secondary | ICD-10-CM | POA: Diagnosis not present

## 2024-03-20 DIAGNOSIS — K219 Gastro-esophageal reflux disease without esophagitis: Secondary | ICD-10-CM | POA: Diagnosis not present

## 2024-03-20 DIAGNOSIS — E119 Type 2 diabetes mellitus without complications: Secondary | ICD-10-CM | POA: Diagnosis not present

## 2024-03-20 DIAGNOSIS — I5022 Chronic systolic (congestive) heart failure: Secondary | ICD-10-CM | POA: Diagnosis not present

## 2024-03-20 DIAGNOSIS — I69354 Hemiplegia and hemiparesis following cerebral infarction affecting left non-dominant side: Secondary | ICD-10-CM | POA: Diagnosis not present

## 2024-03-26 NOTE — Addendum Note (Signed)
 Addended by: Edra Govern D on: 03/26/2024 02:57 PM   Modules accepted: Orders

## 2024-03-26 NOTE — Progress Notes (Signed)
 Remote ICD transmission.

## 2024-03-30 ENCOUNTER — Telehealth (HOSPITAL_COMMUNITY): Payer: Self-pay | Admitting: Cardiology

## 2024-03-30 DIAGNOSIS — I5022 Chronic systolic (congestive) heart failure: Secondary | ICD-10-CM | POA: Diagnosis not present

## 2024-03-30 DIAGNOSIS — R5383 Other fatigue: Secondary | ICD-10-CM | POA: Diagnosis not present

## 2024-03-30 DIAGNOSIS — F17210 Nicotine dependence, cigarettes, uncomplicated: Secondary | ICD-10-CM | POA: Diagnosis not present

## 2024-03-30 DIAGNOSIS — Z299 Encounter for prophylactic measures, unspecified: Secondary | ICD-10-CM | POA: Diagnosis not present

## 2024-03-30 DIAGNOSIS — I2510 Atherosclerotic heart disease of native coronary artery without angina pectoris: Secondary | ICD-10-CM | POA: Diagnosis not present

## 2024-03-30 DIAGNOSIS — Z7189 Other specified counseling: Secondary | ICD-10-CM | POA: Diagnosis not present

## 2024-03-30 DIAGNOSIS — I10 Essential (primary) hypertension: Secondary | ICD-10-CM | POA: Diagnosis not present

## 2024-03-30 DIAGNOSIS — I714 Abdominal aortic aneurysm, without rupture, unspecified: Secondary | ICD-10-CM | POA: Diagnosis not present

## 2024-03-30 DIAGNOSIS — Z0000 Encounter for general adult medical examination without abnormal findings: Secondary | ICD-10-CM | POA: Diagnosis not present

## 2024-03-30 NOTE — Telephone Encounter (Signed)
 F/u 6/9 @ 11

## 2024-03-30 NOTE — Telephone Encounter (Signed)
-----   Message from Peder Bourdon sent at 03/19/2024  1:36 PM EDT ----- Regarding: RE: Patient has question about starting medications back [place],  He'll need followup within the next few days in the office to sort this out.  We'll arrange. ----- Message ----- From: [place], [place] L Sent: 03/19/2024  10:20 AM EDT To: Darlis Eisenmenger, MD Subject: Patient has question about starting medicati#  Hi Dr. Mitzie Anda:  Mr. Clovia Dapper was in the hospital and went home on Monday and they took him off all his heart  failure  medications. He called me this morning and asked should he start taking them again. They put the Jardiance , Entresto , Spironolactone  and Torsemide  on hold and the discharge summary  states  to wait until provider tells you to take.   Thank you   [place]

## 2024-04-03 ENCOUNTER — Telehealth (HOSPITAL_COMMUNITY): Payer: Self-pay

## 2024-04-03 NOTE — Telephone Encounter (Signed)
 Called to confirm/remind patient of their appointment [facility] on 04/06/2024 11:00.   Appointment:   [x] Confirmed  [] Left mess   [] No answer/No voice mail  [] VM Full/unable to leave message  [] Phone not in service  Patient reminded to bring all medications and/or complete list.  Confirmed patient has transportation. Gave directions, instructed to utilize valet parking.

## 2024-04-06 ENCOUNTER — Encounter (HOSPITAL_COMMUNITY): Payer: Self-pay

## 2024-04-06 ENCOUNTER — Ambulatory Visit (HOSPITAL_COMMUNITY)
Admission: RE | Admit: 2024-04-06 | Discharge: 2024-04-06 | Disposition: A | Discharge status: Home / Self Care | Source: Ambulatory Visit | Attending: Adult Health | Admitting: Adult Health

## 2024-04-06 VITALS — BP 121/85 | HR 73 | Ht 66.0 in | Wt 156.0 lb

## 2024-04-06 DIAGNOSIS — G8929 Other chronic pain: Secondary | ICD-10-CM | POA: Diagnosis not present

## 2024-04-06 DIAGNOSIS — Z8673 Personal history of transient ischemic attack (TIA), and cerebral infarction without residual deficits: Secondary | ICD-10-CM | POA: Insufficient documentation

## 2024-04-06 DIAGNOSIS — I252 Old myocardial infarction: Secondary | ICD-10-CM | POA: Insufficient documentation

## 2024-04-06 DIAGNOSIS — I5022 Chronic systolic (congestive) heart failure: Secondary | ICD-10-CM | POA: Diagnosis not present

## 2024-04-06 DIAGNOSIS — I5042 Chronic combined systolic (congestive) and diastolic (congestive) heart failure: Secondary | ICD-10-CM

## 2024-04-06 DIAGNOSIS — I2510 Atherosclerotic heart disease of native coronary artery without angina pectoris: Secondary | ICD-10-CM | POA: Diagnosis not present

## 2024-04-06 DIAGNOSIS — E119 Type 2 diabetes mellitus without complications: Secondary | ICD-10-CM | POA: Insufficient documentation

## 2024-04-06 DIAGNOSIS — Z7901 Long term (current) use of anticoagulants: Secondary | ICD-10-CM | POA: Diagnosis not present

## 2024-04-06 DIAGNOSIS — E785 Hyperlipidemia, unspecified: Secondary | ICD-10-CM | POA: Diagnosis not present

## 2024-04-06 DIAGNOSIS — R0989 Other specified symptoms and signs involving the circulatory and respiratory systems: Secondary | ICD-10-CM | POA: Insufficient documentation

## 2024-04-06 DIAGNOSIS — K219 Gastro-esophageal reflux disease without esophagitis: Secondary | ICD-10-CM | POA: Diagnosis not present

## 2024-04-06 DIAGNOSIS — G4733 Obstructive sleep apnea (adult) (pediatric): Secondary | ICD-10-CM | POA: Insufficient documentation

## 2024-04-06 DIAGNOSIS — I4729 Other ventricular tachycardia: Secondary | ICD-10-CM

## 2024-04-06 DIAGNOSIS — I340 Nonrheumatic mitral (valve) insufficiency: Secondary | ICD-10-CM | POA: Insufficient documentation

## 2024-04-06 DIAGNOSIS — Z86718 Personal history of other venous thrombosis and embolism: Secondary | ICD-10-CM | POA: Insufficient documentation

## 2024-04-06 DIAGNOSIS — I255 Ischemic cardiomyopathy: Secondary | ICD-10-CM | POA: Diagnosis not present

## 2024-04-06 DIAGNOSIS — Z87891 Personal history of nicotine dependence: Secondary | ICD-10-CM | POA: Insufficient documentation

## 2024-04-06 DIAGNOSIS — R7989 Other specified abnormal findings of blood chemistry: Secondary | ICD-10-CM | POA: Diagnosis not present

## 2024-04-06 DIAGNOSIS — Z7984 Long term (current) use of oral hypoglycemic drugs: Secondary | ICD-10-CM | POA: Insufficient documentation

## 2024-04-06 DIAGNOSIS — I110 Hypertensive heart disease with heart failure: Secondary | ICD-10-CM | POA: Diagnosis not present

## 2024-04-06 DIAGNOSIS — Z79899 Other long term (current) drug therapy: Secondary | ICD-10-CM | POA: Insufficient documentation

## 2024-04-06 DIAGNOSIS — N179 Acute kidney failure, unspecified: Secondary | ICD-10-CM | POA: Diagnosis not present

## 2024-04-06 DIAGNOSIS — Z955 Presence of coronary angioplasty implant and graft: Secondary | ICD-10-CM | POA: Insufficient documentation

## 2024-04-06 DIAGNOSIS — Z95810 Presence of automatic (implantable) cardiac defibrillator: Secondary | ICD-10-CM | POA: Insufficient documentation

## 2024-04-06 DIAGNOSIS — Z86711 Personal history of pulmonary embolism: Secondary | ICD-10-CM | POA: Insufficient documentation

## 2024-04-06 DIAGNOSIS — M25562 Pain in left knee: Secondary | ICD-10-CM | POA: Diagnosis not present

## 2024-04-06 DIAGNOSIS — Z604 Social exclusion and rejection: Secondary | ICD-10-CM | POA: Diagnosis not present

## 2024-04-06 DIAGNOSIS — I6523 Occlusion and stenosis of bilateral carotid arteries: Secondary | ICD-10-CM | POA: Diagnosis not present

## 2024-04-06 DIAGNOSIS — I69392 Facial weakness following cerebral infarction: Secondary | ICD-10-CM | POA: Diagnosis not present

## 2024-04-06 DIAGNOSIS — I69354 Hemiplegia and hemiparesis following cerebral infarction affecting left non-dominant side: Secondary | ICD-10-CM | POA: Diagnosis not present

## 2024-04-06 DIAGNOSIS — F17210 Nicotine dependence, cigarettes, uncomplicated: Secondary | ICD-10-CM | POA: Diagnosis not present

## 2024-04-06 DIAGNOSIS — E039 Hypothyroidism, unspecified: Secondary | ICD-10-CM | POA: Diagnosis not present

## 2024-04-06 DIAGNOSIS — Z556 Problems related to health literacy: Secondary | ICD-10-CM | POA: Diagnosis not present

## 2024-04-06 DIAGNOSIS — M25561 Pain in right knee: Secondary | ICD-10-CM | POA: Diagnosis not present

## 2024-04-06 DIAGNOSIS — D649 Anemia, unspecified: Secondary | ICD-10-CM | POA: Diagnosis not present

## 2024-04-06 LAB — BASIC METABOLIC PANEL WITH GFR
Anion gap: 6 (ref 5–15)
BUN: 8 mg/dL (ref 8–23)
CO2: 27 mmol/L (ref 22–32)
Calcium: 8.7 mg/dL — ABNORMAL LOW (ref 8.9–10.3)
Chloride: 110 mmol/L (ref 98–111)
Creatinine, Ser: 1.65 mg/dL — ABNORMAL HIGH (ref 0.61–1.24)
GFR, Estimated: 45 mL/min — ABNORMAL LOW (ref >60)
Glucose, Bld: 43 mg/dL — CL (ref 70–99)
Potassium: 4.2 mmol/L (ref 3.5–5.1)
Sodium: 143 mmol/L (ref 135–145)

## 2024-04-06 NOTE — Progress Notes (Signed)
 ADVANCED HF CLINIC NOTE    Vyas, Veneda Gift, MD  HF : Peder Bourdon, MD  Chief Complaint: Heart Failure   HPI: Daniel Li is a 68 y.o. male with history of CAD, chronic systolic CHF/ischemic cardiomyopathy s/p St. Jude ICD in 2018, HTN, HLD, tobacco abuse, hx ETOH abuse, mitral regurgitation, OSA, hx CVA, hx PE 05/24, DM.  He had NSTEMI in 2007 s/p BMS to D1 and OM2. Later had STEMI in 2013 s/p BMS to RCA. Had NSTEMI in 2014 and found to have CTO of LCX treated medically. He had anterior STEMI in 04/18 s/p DES to LAD. Had DES to mLAD 10/19 in setting of NSTEMI. Last LHC at [facility] in 2023: patent stents in p LAD and D1, moderate diffuse disease mLAD, LCX occluded proximally and fills via collaterals from LAD, patent pRCA stent, 50% PLV, 50% PDA. CAD treated medically.   EF has been in the 20-25% range by echo since 2018.   Presented for mTEER 10/24, however found no significant MR on TEE, despite multiple TTEs suggestive otherwise. Procedure aborted and he was discharged.   Admitted 03/12/24 with left arm weakness, dizziness, and near syncope. MRI and CT head without acute abnormalities. L arm weakness form previous CVA. At discharged jardiance , entresto , torsemide , and spironolactone  held. Discharge back Assisted Living. Saw PCP and jardiance  was restarted.   Overall feeling fine. Gets short of breath with exertion. Denies PND/Orthopnea. Appetite has been poor. No fever or chills. Weight at ALF has been stable. Taking all medications at Assisted Living. No recent drug use.   ROS: All systems negative except as listed in HPI, PMH and Problem List.  SH:  Social History   Socioeconomic History   Marital status: Single    Spouse name: Not on file   Number of children: 0   Years of education: 9   Highest education level: 9th grade  Occupational History   Not on file  Tobacco Use   Smoking status: Former    Current packs/day: 0.50    Average packs/day: 0.5 packs/day for  46.7 years (23.4 ttl pk-yrs)    Types: Cigarettes, Cigars    Start date: 07/21/1977   Smokeless tobacco: Never  Vaping Use   Vaping status: Never Used  Substance and Sexual Activity   Alcohol use: Yes    Comment: 09/17/2017 "nothing since 05/2017"   Drug use: Not Currently    Comment: previously incarcerated for drug related offense.   Sexual activity: Not Currently  Other Topics Concern   Not on file  Social History Narrative   Not on file   Social Drivers of Health   Financial Resource Strain: Low Risk  (02/25/2023)   [facility]   Overall Financial Resource Strain (CARDIA)    Difficulty of Paying Living Expenses: Not hard at all  Food Insecurity: No Food Insecurity (03/13/2024)   Hunger Vital Sign    Worried About Running Out of Food in the Last Year: Never true    Ran Out of Food in the Last Year: Never true  Transportation Needs: No Transportation Needs (03/13/2024)   PRAPARE - Administrator, Civil Service (Medical): No    Lack of Transportation (Non-Medical): No  Recent Concern: Transportation Needs - Unmet Transportation Needs (01/22/2024)   PRAPARE - Administrator, Civil Service (Medical): Yes    Lack of Transportation (Non-Medical): Yes  Physical Activity: Not on file  Stress: Not on file  Social Connections: Socially Isolated (03/13/2024)   Social Connection and Isolation Panel [NHANES]    Frequency of Communication with Friends and Family: Three times a week    Frequency of Social Gatherings with Friends and Family: Never    Attends Religious Services: Never    Database administrator or Organizations: No    Attends Banker Meetings: Never    Marital Status: Never married  Intimate Partner Violence: Not At Risk (03/13/2024)   Humiliation, Afraid, Rape, and Kick questionnaire    Fear of Current or Ex-Partner: No    Emotionally Abused: No    Physically Abused: No    Sexually Abused: No    FH:   Family History  Problem Relation Age of Onset   Stroke Mother    Heart attack Mother    Heart attack Father    Stroke Sister    Heart attack Sister    Heart attack Brother    Stroke Brother    Liver disease Neg Hx    Colon cancer Neg Hx     Past Medical History:  Diagnosis Date   AICD (automatic cardioverter/defibrillator) present    Anxiety    Arthritis    "all over" (09/17/2017)   Burn    CAD (coronary artery disease)    a. NSTEMI s/p BMS to 1st Diagonal and distal OM2 in 2007; b. STEMI 03/26/12 s/p BMS to RCA; c. NSTEMI 10/2012 : CTO of LCx (unable to open with PCI) and PL branch, mod dz of LAD and diagonal, and preserved LV systolic fxn, Med Rx;  d.  anterior STEMI in 01/2017 with DES to Proximal LAD   CAD in native artery 08/23/2020   Cardiomyopathy EF 35% on cath 06/30/14, new from jan 2015 07/20/2014   Chest pain    CHF (congestive heart failure) (HCC)    Chronic back pain    "all over" (09/17/2017)   DDD (degenerative disc disease), cervical    Depression    GERD (gastroesophageal reflux disease)    Headache    "a few/wk" (09/17/2017)   HTN (hypertension)    Hypercholesterolemia    Mental disorder    Myocardial infarction (HCC)    "I've had 7" (09/17/2017)   Pulmonary edema    Respiratory failure (HCC)    Sciatic pain    Sleep apnea    Stroke [facility])    a. multiple dating back to 2002; *I''ve had 5; LUE/LLE weaker since" (09/17/2017)   Tibia fracture (l) leg   Tobacco abuse    Type 2 diabetes mellitus (HCC)    Unstable angina (HCC)     Current Outpatient Medications  Medication Sig Dispense Refill   acetaminophen  (TYLENOL ) 325 MG tablet Take 650 mg by mouth every 6 (six) hours as needed for mild pain.     albuterol  (VENTOLIN  HFA) 108 (90 Base) MCG/ACT inhaler Inhale 2 puffs into the lungs every 4 (four) hours as needed for wheezing or shortness of breath. 1 each 3   amiodarone  (PACERONE ) 200 MG tablet Take 1 tablet (200 mg total) by mouth daily. 90 tablet 3    apixaban  (ELIQUIS ) 5 MG TABS tablet TAKE (1) TABLET BY MOUTH TWICE DAILY. 180 tablet 3   digoxin  (LANOXIN ) 0.125 MG tablet TAKE (1) TABLET BY MOUTH ONCE DAILY. 30 tablet 11   empagliflozin  (JARDIANCE ) 10 MG TABS tablet Take 10 mg by mouth daily.     ezetimibe  (ZETIA ) 10 MG tablet Take 1 tablet (10 mg  total) by mouth daily. 30 tablet 3   gabapentin  (NEURONTIN ) 100 MG capsule Take 100 mg by mouth 2 (two) times daily.     lidocaine  (LIDODERM ) 5 % Place 1 patch onto the skin every 12 (twelve) hours. Leave off 12 hours     metFORMIN  (GLUCOPHAGE ) 500 MG tablet Take 500 mg by mouth daily with breakfast.     nitroGLYCERIN  (NITROSTAT ) 0.4 MG SL tablet PLACE ONE (1) TABLET UNDER TONGUE EVERY 5 MINUTES UP TO (3) DOSES AS NEEDED FOR CHEST PAIN. IF NO RELIEF, CONTACT MD. 25 tablet 3   pantoprazole  (PROTONIX ) 40 MG tablet Take 40 mg by mouth daily.     potassium chloride  (KLOR-CON ) 10 MEQ tablet Take 2 tablets (20 mEq total) by mouth daily. 60 tablet 11   torsemide  (DEMADEX ) 20 MG tablet TAKE (1) TABLET BY MOUTH ONCE DAILY. 30 tablet 11   levothyroxine  (SYNTHROID ) 100 MCG tablet Take 1 tablet (100 mcg total) by mouth daily at 6 (six) AM. 30 tablet 0   No current facility-administered medications for this encounter.    Vitals:   04/06/24 1150  BP: 121/85  Pulse: 73  SpO2: 97%  Weight: 70.8 kg (156 lb)  Height: 5\' 6"  (1.676 m)     PHYSICAL EXAM: General:   No resp difficulty Neck: supple. JVP 8-9 Cor: PMI nondisplaced. Regular rate & rhythm. No rubs, gallops or murmurs. Lungs: crackles in the bases on room air.  Abdomen: soft, nontender, nondistended.  Extremities: no cyanosis, clubbing, rash, edema Neuro: alert & oriented x3   ASSESSMENT & PLAN:  Chronic systolic CHF:  - D/t ischemic cardiomyopathy - EF has been in 20-25% range since 2018 -TEE 10/24: EF 20-2%, RV okay, severe LAE, mild MR - S/p St. Jude ICD.  - NYHA III. ON exam he does have some fluid building up with crackles in the  bases. Restart torsemide  20 mg daily  - Hold off on entresto  and spiro - Continue  Jardiance  10 mg daily  - Continue Digoxin  0.125 mg daily   2. CAD: - Extensive CAD history with multiple prior Mis and stents - Last cath in 2023 with patent stents, occluded LCX and moderate diffuse disease elsewhere treated medically - No chest pain.  - no ASA. Continue eliquis .   - No statin given history of rhabdomyolysis while on statin therapy  3. Hx LV thrombus -No thrombus noted on most recent echocardiograms -Had been on Warfarin which was stopped during prior admit to [facility].  -Continue eliquis  5 mg twice a day   4. H/o NSVT - Continue amio 200 mg daily. TSH abnormal recently. Will need to check thyroid  panel next visit.   Follow up in 2 weeks.  Kimmy Parish NP-C  2:20 PM

## 2024-04-06 NOTE — Patient Instructions (Signed)
 RESTART Torsemide  20 mg daily.  STOP Entresto  and Spironolactone .  Labs done today, your results will be available in MyChart, we will contact you for abnormal readings.  Your physician recommends that you schedule a follow-up appointment in: 2 weeks.  If you have any questions or concerns before your next appointment please send us  a message through [place] or call our office at [phone].    TO LEAVE A MESSAGE FOR THE NURSE SELECT OPTION 2, PLEASE LEAVE A MESSAGE INCLUDING: YOUR NAME DATE OF BIRTH CALL BACK NUMBER REASON FOR CALL**this is important as we prioritize the call backs  YOU WILL RECEIVE A CALL BACK THE SAME DAY AS LONG AS YOU CALL BEFORE 4:00 PM  At the [facility], you and your health needs are our priority. As part of our continuing mission to provide you with exceptional heart care, we have created designated Provider Care Teams. These Care Teams include your primary Cardiologist (physician) and Advanced Practice Providers (APPs- Physician Assistants and Nurse Practitioners) who all work together to provide you with the care you need, when you need it.   You may see any of the following providers on your designated Care Team at your next follow up: Dr Jules Oar Dr Peder Bourdon Dr. Alwin Baars Dr. Arta Lark Amy Marijane Shoulders, NP Ruddy Corral, [place] [facility] [place], [place] Dennise Fitz, NP [place] Lee, NP Shawnee Dellen, NP Luster Salters, PharmD Bevely Brush, PharmD   Please be sure to bring in all your medications bottles to every appointment.    Thank you for choosing [facility]

## 2024-04-09 ENCOUNTER — Emergency Department (HOSPITAL_COMMUNITY)

## 2024-04-09 ENCOUNTER — Inpatient Hospital Stay (HOSPITAL_COMMUNITY)
Admission: EM | Admit: 2024-04-09 | Discharge: 2024-04-14 | DRG: 175 | Disposition: A | Discharge status: Nursing Home (Includes ICF) | Source: Skilled Nursing Facility | Attending: Internal Medicine | Admitting: Internal Medicine

## 2024-04-09 ENCOUNTER — Encounter (HOSPITAL_COMMUNITY): Payer: Self-pay

## 2024-04-09 ENCOUNTER — Other Ambulatory Visit: Payer: Self-pay

## 2024-04-09 DIAGNOSIS — I959 Hypotension, unspecified: Secondary | ICD-10-CM | POA: Diagnosis present

## 2024-04-09 DIAGNOSIS — Z9861 Coronary angioplasty status: Secondary | ICD-10-CM | POA: Diagnosis not present

## 2024-04-09 DIAGNOSIS — I2699 Other pulmonary embolism without acute cor pulmonale: Secondary | ICD-10-CM | POA: Diagnosis present

## 2024-04-09 DIAGNOSIS — Z539 Procedure and treatment not carried out, unspecified reason: Secondary | ICD-10-CM | POA: Diagnosis not present

## 2024-04-09 DIAGNOSIS — Z7901 Long term (current) use of anticoagulants: Secondary | ICD-10-CM | POA: Diagnosis not present

## 2024-04-09 DIAGNOSIS — Z5982 Transportation insecurity: Secondary | ICD-10-CM

## 2024-04-09 DIAGNOSIS — Z86711 Personal history of pulmonary embolism: Secondary | ICD-10-CM

## 2024-04-09 DIAGNOSIS — I214 Non-ST elevation (NSTEMI) myocardial infarction: Secondary | ICD-10-CM | POA: Diagnosis present

## 2024-04-09 DIAGNOSIS — I2489 Other forms of acute ischemic heart disease: Secondary | ICD-10-CM | POA: Diagnosis present

## 2024-04-09 DIAGNOSIS — I69334 Monoplegia of upper limb following cerebral infarction affecting left non-dominant side: Secondary | ICD-10-CM | POA: Diagnosis not present

## 2024-04-09 DIAGNOSIS — E1122 Type 2 diabetes mellitus with diabetic chronic kidney disease: Secondary | ICD-10-CM | POA: Diagnosis present

## 2024-04-09 DIAGNOSIS — Z8249 Family history of ischemic heart disease and other diseases of the circulatory system: Secondary | ICD-10-CM

## 2024-04-09 DIAGNOSIS — Z95810 Presence of automatic (implantable) cardiac defibrillator: Secondary | ICD-10-CM | POA: Diagnosis not present

## 2024-04-09 DIAGNOSIS — E876 Hypokalemia: Secondary | ICD-10-CM | POA: Diagnosis present

## 2024-04-09 DIAGNOSIS — F1090 Alcohol use, unspecified, uncomplicated: Secondary | ICD-10-CM | POA: Diagnosis not present

## 2024-04-09 DIAGNOSIS — F1010 Alcohol abuse, uncomplicated: Secondary | ICD-10-CM | POA: Diagnosis present

## 2024-04-09 DIAGNOSIS — R6889 Other general symptoms and signs: Secondary | ICD-10-CM | POA: Diagnosis not present

## 2024-04-09 DIAGNOSIS — Z860101 Personal history of adenomatous and serrated colon polyps: Secondary | ICD-10-CM

## 2024-04-09 DIAGNOSIS — F32A Depression, unspecified: Secondary | ICD-10-CM | POA: Diagnosis present

## 2024-04-09 DIAGNOSIS — Y903 Blood alcohol level of 60-79 mg/100 ml: Secondary | ICD-10-CM | POA: Diagnosis present

## 2024-04-09 DIAGNOSIS — R4189 Other symptoms and signs involving cognitive functions and awareness: Secondary | ICD-10-CM | POA: Diagnosis present

## 2024-04-09 DIAGNOSIS — Z91014 Allergy to mammalian meats: Secondary | ICD-10-CM

## 2024-04-09 DIAGNOSIS — Z79890 Hormone replacement therapy (postmenopausal): Secondary | ICD-10-CM

## 2024-04-09 DIAGNOSIS — I130 Hypertensive heart and chronic kidney disease with heart failure and stage 1 through stage 4 chronic kidney disease, or unspecified chronic kidney disease: Secondary | ICD-10-CM | POA: Diagnosis present

## 2024-04-09 DIAGNOSIS — I6930 Unspecified sequelae of cerebral infarction: Secondary | ICD-10-CM

## 2024-04-09 DIAGNOSIS — N183 Chronic kidney disease, stage 3 unspecified: Secondary | ICD-10-CM | POA: Diagnosis present

## 2024-04-09 DIAGNOSIS — E512 Wernicke's encephalopathy: Secondary | ICD-10-CM | POA: Diagnosis not present

## 2024-04-09 DIAGNOSIS — E1165 Type 2 diabetes mellitus with hyperglycemia: Secondary | ICD-10-CM | POA: Diagnosis present

## 2024-04-09 DIAGNOSIS — J439 Emphysema, unspecified: Secondary | ICD-10-CM | POA: Diagnosis not present

## 2024-04-09 DIAGNOSIS — I340 Nonrheumatic mitral (valve) insufficiency: Secondary | ICD-10-CM | POA: Diagnosis present

## 2024-04-09 DIAGNOSIS — Z87891 Personal history of nicotine dependence: Secondary | ICD-10-CM

## 2024-04-09 DIAGNOSIS — I252 Old myocardial infarction: Secondary | ICD-10-CM

## 2024-04-09 DIAGNOSIS — R9431 Abnormal electrocardiogram [ECG] [EKG]: Secondary | ICD-10-CM | POA: Insufficient documentation

## 2024-04-09 DIAGNOSIS — G8929 Other chronic pain: Secondary | ICD-10-CM | POA: Diagnosis present

## 2024-04-09 DIAGNOSIS — D631 Anemia in chronic kidney disease: Secondary | ICD-10-CM | POA: Diagnosis present

## 2024-04-09 DIAGNOSIS — Z7984 Long term (current) use of oral hypoglycemic drugs: Secondary | ICD-10-CM | POA: Diagnosis not present

## 2024-04-09 DIAGNOSIS — I5042 Chronic combined systolic (congestive) and diastolic (congestive) heart failure: Secondary | ICD-10-CM | POA: Diagnosis present

## 2024-04-09 DIAGNOSIS — R079 Chest pain, unspecified: Secondary | ICD-10-CM | POA: Diagnosis not present

## 2024-04-09 DIAGNOSIS — I255 Ischemic cardiomyopathy: Secondary | ICD-10-CM | POA: Diagnosis present

## 2024-04-09 DIAGNOSIS — I9589 Other hypotension: Secondary | ICD-10-CM | POA: Diagnosis not present

## 2024-04-09 DIAGNOSIS — R600 Localized edema: Secondary | ICD-10-CM | POA: Diagnosis not present

## 2024-04-09 DIAGNOSIS — K625 Hemorrhage of anus and rectum: Secondary | ICD-10-CM

## 2024-04-09 DIAGNOSIS — E039 Hypothyroidism, unspecified: Secondary | ICD-10-CM | POA: Diagnosis present

## 2024-04-09 DIAGNOSIS — Z743 Need for continuous supervision: Secondary | ICD-10-CM | POA: Diagnosis not present

## 2024-04-09 DIAGNOSIS — R7989 Other specified abnormal findings of blood chemistry: Secondary | ICD-10-CM | POA: Diagnosis present

## 2024-04-09 DIAGNOSIS — I5022 Chronic systolic (congestive) heart failure: Secondary | ICD-10-CM | POA: Diagnosis not present

## 2024-04-09 DIAGNOSIS — E782 Mixed hyperlipidemia: Secondary | ICD-10-CM | POA: Diagnosis present

## 2024-04-09 DIAGNOSIS — I2609 Other pulmonary embolism with acute cor pulmonale: Secondary | ICD-10-CM | POA: Diagnosis not present

## 2024-04-09 DIAGNOSIS — M7989 Other specified soft tissue disorders: Secondary | ICD-10-CM | POA: Diagnosis not present

## 2024-04-09 DIAGNOSIS — Z955 Presence of coronary angioplasty implant and graft: Secondary | ICD-10-CM

## 2024-04-09 DIAGNOSIS — K219 Gastro-esophageal reflux disease without esophagitis: Secondary | ICD-10-CM | POA: Diagnosis present

## 2024-04-09 DIAGNOSIS — Z555 Less than a high school diploma: Secondary | ICD-10-CM

## 2024-04-09 DIAGNOSIS — R946 Abnormal results of thyroid function studies: Secondary | ICD-10-CM | POA: Diagnosis present

## 2024-04-09 DIAGNOSIS — R0989 Other specified symptoms and signs involving the circulatory and respiratory systems: Secondary | ICD-10-CM | POA: Diagnosis present

## 2024-04-09 DIAGNOSIS — Z823 Family history of stroke: Secondary | ICD-10-CM

## 2024-04-09 DIAGNOSIS — I4729 Other ventricular tachycardia: Secondary | ICD-10-CM | POA: Diagnosis not present

## 2024-04-09 DIAGNOSIS — Z79899 Other long term (current) drug therapy: Secondary | ICD-10-CM

## 2024-04-09 DIAGNOSIS — M159 Polyosteoarthritis, unspecified: Secondary | ICD-10-CM | POA: Diagnosis present

## 2024-04-09 DIAGNOSIS — I472 Ventricular tachycardia, unspecified: Secondary | ICD-10-CM | POA: Diagnosis present

## 2024-04-09 DIAGNOSIS — I2510 Atherosclerotic heart disease of native coronary artery without angina pectoris: Secondary | ICD-10-CM | POA: Diagnosis present

## 2024-04-09 DIAGNOSIS — F419 Anxiety disorder, unspecified: Secondary | ICD-10-CM | POA: Diagnosis present

## 2024-04-09 DIAGNOSIS — F17210 Nicotine dependence, cigarettes, uncomplicated: Secondary | ICD-10-CM | POA: Diagnosis present

## 2024-04-09 DIAGNOSIS — Z604 Social exclusion and rejection: Secondary | ICD-10-CM | POA: Diagnosis present

## 2024-04-09 DIAGNOSIS — R519 Headache, unspecified: Secondary | ICD-10-CM | POA: Diagnosis present

## 2024-04-09 DIAGNOSIS — R0789 Other chest pain: Secondary | ICD-10-CM

## 2024-04-09 DIAGNOSIS — R404 Transient alteration of awareness: Secondary | ICD-10-CM | POA: Diagnosis not present

## 2024-04-09 DIAGNOSIS — G4733 Obstructive sleep apnea (adult) (pediatric): Secondary | ICD-10-CM | POA: Diagnosis present

## 2024-04-09 LAB — CBC
HCT: 39.7 % (ref 39.0–52.0)
Hemoglobin: 12.9 g/dL — ABNORMAL LOW (ref 13.0–17.0)
MCH: 26.2 pg (ref 26.0–34.0)
MCHC: 32.5 g/dL (ref 30.0–36.0)
MCV: 80.7 fL (ref 80.0–100.0)
Platelets: 261 10*3/uL (ref 150–400)
RBC: 4.92 MIL/uL (ref 4.22–5.81)
RDW: 16.8 % — ABNORMAL HIGH (ref 11.5–15.5)
WBC: 6.7 10*3/uL (ref 4.0–10.5)
nRBC: 0 % (ref 0.0–0.2)

## 2024-04-09 LAB — ETHANOL: Alcohol, Ethyl (B): 68 mg/dL — ABNORMAL HIGH (ref <15)

## 2024-04-09 LAB — BRAIN NATRIURETIC PEPTIDE: B Natriuretic Peptide: 414 pg/mL — ABNORMAL HIGH (ref 0.0–100.0)

## 2024-04-09 LAB — BASIC METABOLIC PANEL WITH GFR
Anion gap: 15 (ref 5–15)
BUN: 12 mg/dL (ref 8–23)
CO2: 22 mmol/L (ref 22–32)
Calcium: 8.8 mg/dL — ABNORMAL LOW (ref 8.9–10.3)
Chloride: 103 mmol/L (ref 98–111)
Creatinine, Ser: 1.25 mg/dL — ABNORMAL HIGH (ref 0.61–1.24)
GFR, Estimated: >60 mL/min (ref >60)
Glucose, Bld: 191 mg/dL — ABNORMAL HIGH (ref 70–99)
Potassium: 3 mmol/L — ABNORMAL LOW (ref 3.5–5.1)
Sodium: 140 mmol/L (ref 135–145)

## 2024-04-09 LAB — GLUCOSE, CAPILLARY: Glucose-Capillary: 121 mg/dL — ABNORMAL HIGH (ref 70–99)

## 2024-04-09 LAB — MAGNESIUM: Magnesium: 2.2 mg/dL (ref 1.7–2.4)

## 2024-04-09 LAB — D-DIMER, QUANTITATIVE: D-Dimer, Quant: 1.13 ug{FEU}/mL — ABNORMAL HIGH (ref 0.00–0.50)

## 2024-04-09 LAB — TROPONIN I (HIGH SENSITIVITY)
Troponin I (High Sensitivity): 35 ng/L — ABNORMAL HIGH (ref <18)
Troponin I (High Sensitivity): 36 ng/L — ABNORMAL HIGH

## 2024-04-09 MED ORDER — ASPIRIN 81 MG PO CHEW
162.0000 mg | CHEWABLE_TABLET | Freq: Once | ORAL | Status: AC
  Administered 2024-04-09: 162 mg via ORAL
  Filled 2024-04-09: qty 2

## 2024-04-09 MED ORDER — HYDROMORPHONE HCL 1 MG/ML IJ SOLN
0.5000 mg | Freq: Once | INTRAMUSCULAR | Status: AC
  Administered 2024-04-09: 0.5 mg via INTRAVENOUS
  Filled 2024-04-09: qty 0.5

## 2024-04-09 MED ORDER — ENOXAPARIN SODIUM 80 MG/0.8ML IJ SOSY
1.0000 mg/kg | PREFILLED_SYRINGE | Freq: Once | INTRAMUSCULAR | Status: AC
  Administered 2024-04-09: 70 mg via SUBCUTANEOUS
  Filled 2024-04-09: qty 0.8

## 2024-04-09 MED ORDER — IOHEXOL 350 MG/ML SOLN
75.0000 mL | Freq: Once | INTRAVENOUS | Status: AC | PRN
  Administered 2024-04-09: 75 mL via INTRAVENOUS

## 2024-04-09 MED ORDER — POTASSIUM CHLORIDE CRYS ER 20 MEQ PO TBCR
40.0000 meq | EXTENDED_RELEASE_TABLET | Freq: Once | ORAL | Status: AC
  Administered 2024-04-09: 40 meq via ORAL
  Filled 2024-04-09: qty 2
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
## 2024-04-09 NOTE — ED Notes (Signed)
 Patient transported to X-ray

## 2024-04-09 NOTE — ED Provider Notes (Signed)
 Daniel [name]   CSN: [id number] Arrival date & time: 04/09/24  1914     Patient presents with: Chest Pain   Pacer Daniel [name] is a 68 y.o. male.   68 year old male with a history of alcohol use, diabetes, hypertension, hyperlipidemia, PE, stroke with residual left-sided deficits on Eliquis , and ischemic cardiomyopathy status post PCI who presents emergency department with shortness of breath, dizziness, chest tightness, leg swelling, and weight gain.  Has been going on for several days.  Says it feels similar to prior hospitalizations.  Has difficulty characterizing the chest pain but describes it as left-sided and pressure-like.  Says that he has been having some weight gain approximately 10 pounds.  Also has been same has been getting lightheaded recently.  Did drink some alcohol prior to arrival.  Unfortunately does not recall if he has been compliant with his Eliquis  or not.  Unable to reach facility for additional information.       Prior to Admission medications   Medication Sig Start Date End Date Taking? Authorizing Provider  acetaminophen  (TYLENOL ) 325 MG tablet Take 650 mg by mouth every 6 (six) hours as needed for mild pain.    [provider]  albuterol  (VENTOLIN  HFA) 108 (90 Base) MCG/ACT inhaler Inhale 2 puffs into the lungs every 4 (four) hours as needed for wheezing or shortness of breath. 06/19/23   Early Glisson, MD  amiodarone  (PACERONE ) 200 MG tablet Take 1 tablet (200 mg total) by mouth daily. 07/16/23   Arleene Belt, PA-C  apixaban  (ELIQUIS ) 5 MG TABS tablet TAKE (1) TABLET BY MOUTH TWICE DAILY. 03/12/24   Darlis Eisenmenger, MD  digoxin  (LANOXIN ) 0.125 MG tablet TAKE (1) TABLET BY MOUTH ONCE DAILY. 09/09/23   Darlis Eisenmenger, MD  empagliflozin  (JARDIANCE ) 10 MG TABS tablet Take 10 mg by mouth daily.    [provider]  ezetimibe  (ZETIA ) 10 MG tablet Take 1 tablet (10 mg total) by mouth  daily. 03/27/17   Laurann Pollock, MD  gabapentin  (NEURONTIN ) 100 MG capsule Take 100 mg by mouth 2 (two) times daily.    [provider]  levothyroxine  (SYNTHROID ) 100 MCG tablet Take 1 tablet (100 mcg total) by mouth daily at 6 (six) AM. 03/18/24   Albin Huh, MD  lidocaine  (LIDODERM ) 5 % Place 1 patch onto the skin every 12 (twelve) hours. Leave off 12 hours 05/27/23   [provider]  metFORMIN  (GLUCOPHAGE ) 500 MG tablet Take 500 mg by mouth daily with breakfast.    [provider]  nitroGLYCERIN  (NITROSTAT ) 0.4 MG SL tablet PLACE ONE (1) TABLET UNDER TONGUE EVERY 5 MINUTES UP TO (3) DOSES AS NEEDED FOR CHEST PAIN. IF NO RELIEF, CONTACT MD. 09/29/19   Laurann Pollock, MD  pantoprazole  (PROTONIX ) 40 MG tablet Take 40 mg by mouth daily.    [provider]  potassium chloride  (KLOR-CON ) 10 MEQ tablet Take 2 tablets (20 mEq total) by mouth daily. 11/06/23 04/06/24  Darlis Eisenmenger, MD  torsemide  (DEMADEX ) 20 MG tablet TAKE (1) TABLET BY MOUTH ONCE DAILY. 01/24/24   Darlis Eisenmenger, MD    Allergies: Pork-derived products    Review of Systems  Updated Vital Signs BP (!) 123/95   Pulse 72   Temp 98.6 F (37 C) (Oral)   Resp 17   Ht 5' 6 (1.676 m)   Wt 70.8 kg   SpO2 100%   BMI 25.18 kg/m   Physical  Exam Vitals and nursing [name] reviewed.  Constitutional:      General: He is not in acute distress.    Appearance: He is well-developed.  HENT:     Head: Normocephalic and atraumatic.     Right Ear: External ear normal.     Left Ear: External ear normal.     Nose: Nose normal.   Eyes:     Extraocular Movements: Extraocular movements intact.     Conjunctiva/sclera: Conjunctivae normal.     Pupils: Pupils are equal, round, and reactive to light.    Cardiovascular:     Rate and Rhythm: Normal rate and regular rhythm.     Heart sounds: Normal heart sounds.     Comments: Radial pulses 2+ bilaterally.  DP pulses 2+ bilaterally. Pulmonary:      Effort: Pulmonary effort is normal. No respiratory distress.     Breath sounds: Rales (Bibasilar) present.   Musculoskeletal:     Cervical back: Normal range of motion and neck supple.     Right lower leg: Edema (1+) present.     Left lower leg: Edema (1+) present.   Skin:    General: Skin is warm and dry.   Neurological:     Mental Status: He is alert. Mental status is at baseline.   Psychiatric:        Mood and Affect: Mood normal.        Behavior: Behavior normal.     (all labs ordered are listed, but only abnormal results are displayed) Labs Reviewed  BASIC METABOLIC PANEL WITH GFR - Abnormal; Notable for the following components:      Result Value   Potassium 3.0 (*)    Glucose, Bld 191 (*)    Creatinine, Ser 1.25 (*)    Calcium  8.8 (*)    All other components within normal limits  CBC - Abnormal; Notable for the following components:   Hemoglobin 12.9 (*)    RDW 16.8 (*)    All other components within normal limits  BRAIN NATRIURETIC PEPTIDE - Abnormal; Notable for the following components:   B Natriuretic Peptide 414.0 (*)    All other components within normal limits  D-DIMER, QUANTITATIVE - Abnormal; Notable for the following components:   D-Dimer, Quant 1.13 (*)    All other components within normal limits  ETHANOL - Abnormal; Notable for the following components:   Alcohol, Ethyl (B) 68 (*)    All other components within normal limits  TROPONIN I (HIGH SENSITIVITY) - Abnormal; Notable for the following components:   Troponin I (High Sensitivity) 35 (*)    All other components within normal limits  TROPONIN I (HIGH SENSITIVITY) - Abnormal; Notable for the following components:   Troponin I (High Sensitivity) 36 (*)    All other components within normal limits  MAGNESIUM     EKG: None  Radiology: CT Angio Chest PE W and/or Wo Contrast Result Date: 04/09/2024 CLINICAL DATA:  Pulmonary embolism suspected. Positive D-dimer. Chest pain for 2-3 days. EXAM: CT  ANGIOGRAPHY CHEST WITH CONTRAST TECHNIQUE: Multidetector CT imaging of the chest was performed using the standard protocol during bolus administration of intravenous contrast. Multiplanar CT image reconstructions and MIPs were obtained to evaluate the vascular anatomy. RADIATION DOSE REDUCTION: This exam was performed according to the departmental dose-optimization program which includes automated exposure control, adjustment of the mA and/or kV according to patient size and/or use of iterative reconstruction technique. CONTRAST:  75mL OMNIPAQUE  IOHEXOL  350 MG/ML SOLN COMPARISON:  Chest radiograph 04/09/2024  and CTA chest 01/22/2024 FINDINGS: Cardiovascular: Nonocclusive lobar pulmonary embolism in the left lower lobe pulmonary artery with associated wispy stranding. This may represent chronic thromboembolism. Acute appearing pulmonary embolism in a segmental branch of the left lower lobe pulmonary artery. The left ventricle is dilated. No evidence of right heart strain as there is compression of the right ventricle secondary to left ventricular enlargement. No pericardial effusion. Normal caliber thoracic aorta. Coronary artery and aortic atherosclerotic calcification. Left chest wall ICD. Mediastinum/Nodes: Trachea and esophagus are unremarkable. No thoracic adenopathy. Lungs/Pleura: Emphysema. Lower lobe atelectasis/scarring. The lungs are otherwise clear. No pleural effusion or pneumothorax. Upper Abdomen: No acute abnormality. Musculoskeletal: No acute fracture. Review of the MIP images confirms the above findings. IMPRESSION: 1. Acute pulmonary embolism in a segmental branch of the left lower lobe pulmonary artery. No evidence of right heart strain. Aortic Atherosclerosis (ICD10-I70.0) and Emphysema (ICD10-J43.9). Critical Value/emergent results were called by telephone at the time of interpretation on 04/09/2024 at 9:52 pm to provider Shyrl Doyne , who verbally acknowledged these results. Electronically  Signed   By: Rozell Cornet M.D.   On: 04/09/2024 21:54   DG Chest 2 View Result Date: 04/09/2024 CLINICAL DATA:  Chest pain for 2 days, initial encounter EXAM: CHEST - 2 VIEW COMPARISON:  02/04/2024 FINDINGS: Cardiac shadow is within normal limits. Defibrillator is again seen. Lungs are well aerated bilaterally. No focal infiltrate or effusion is seen. No bony abnormality is noted. IMPRESSION: No active cardiopulmonary disease. Electronically Signed   By: Violeta Grey M.D.   On: 04/09/2024 19:56     Procedures   Medications Ordered in the ED  potassium chloride  SA (KLOR-CON  M) CR tablet 40 mEq (40 mEq Oral Given 04/09/24 2029)  aspirin  chewable tablet 162 mg (162 mg Oral Given 04/09/24 2031)  iohexol  (OMNIPAQUE ) 350 MG/ML injection 75 mL (75 mLs Intravenous Contrast Given 04/09/24 2130)  enoxaparin  (LOVENOX ) injection 70 mg (70 mg Subcutaneous Given 04/09/24 2226)  HYDROmorphone  (DILAUDID ) injection 0.5 mg (0.5 mg Intravenous Given 04/09/24 2237)    Clinical Course as of 04/09/24 2333  Thu Apr 09, 2024  2222 Discussed admission with Dr. Elyse Hand. [RP]    Clinical Course User Index [RP] Ninetta Basket, MD                                 Medical Decision Making Amount and/or Complexity of Data Reviewed Labs: ordered. Radiology: ordered.  Risk OTC drugs. Prescription drug management. Decision regarding hospitalization.   68 year old male with a history of alcohol use, diabetes, hypertension, hyperlipidemia, PE, stroke with residual left-sided deficits on Eliquis , and ischemic cardiomyopathy status post PCI who presents emergency department with shortness of breath, dizziness, chest tightness, leg swelling, and weight gain.   Initial Ddx:  MI, PE, CHF, DVT  MDM/Course:  Patient presents emergency department with chest pain shortness of breath.  Patient unfortunately is very poor historian.  States that facility and it is unclear exactly what medications he has been getting  including his Eliquis .  On exam does have bibasilar rales with lower extremity edema.  Swelling appears symmetric to me but he feels that his right leg might be some more swollen than his left leg.  EKG shows nonspecific intraventricular conduction delay.  Troponins elevated but stable at 35 and 36.  Did have a D-dimer since it was elevated and CTA of the chest which does show a segmental PE in the left lower  lobe which is where the patient is having this pain.  Unclear if this is refractory to his Eliquis  or if he has been noncompliant.  Given a dose of Lovenox .  Will admit to the hospital with his elevated troponin and unclear medication compliance.  Also placed an ultrasound of his right lower extremity to evaluate for DVT.  This patient presents to the ED for concern of complaints listed in HPI, this involves an extensive number of treatment options, and is a complaint that carries with it a high risk of complications and morbidity. Disposition including potential need for admission considered.   Dispo: Admit to Floor  Records reviewed Outpatient Clinic Notes The following labs were independently interpreted: Chemistry and show CKD I independently reviewed the following imaging with scope of interpretation limited to determining acute life threatening conditions related to emergency care: CT Chest and agree with the radiologist interpretation with the following exceptions: none I personally reviewed and interpreted cardiac monitoring: normal sinus rhythm  I personally reviewed and interpreted the pt's EKG: see above for interpretation  I have reviewed the patients home medications and made adjustments as needed Consults: Hospitalist Social Determinants of health:  Facility resident  Portions of this [name] were generated with Scientist, clinical (histocompatibility and immunogenetics). Dictation errors may occur despite best attempts at proofreading.     Final diagnoses:  Hypokalemia  Other acute pulmonary embolism,  unspecified whether acute cor pulmonale present [facility])  Leg swelling    ED Discharge Orders     None          Ninetta Basket, MD 04/09/24 2333
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
## 2024-04-09 NOTE — ED Notes (Signed)
 Pt's O2 sat 91%. Pt placed on 2L [place].

## 2024-04-09 NOTE — ED Notes (Signed)
 Patient transported to CT

## 2024-04-09 NOTE — ED Triage Notes (Signed)
 [facility] from [facility]  ([address] [place] [address] ) Cc of chest pain. 2-3 days. Worse when you press on it. Per EMS + ETOH   Has a heart monitor in place already.

## 2024-04-10 ENCOUNTER — Other Ambulatory Visit (HOSPITAL_COMMUNITY): Payer: Self-pay | Admitting: *Deleted

## 2024-04-10 ENCOUNTER — Inpatient Hospital Stay (HOSPITAL_COMMUNITY)

## 2024-04-10 ENCOUNTER — Other Ambulatory Visit: Payer: Self-pay

## 2024-04-10 DIAGNOSIS — E876 Hypokalemia: Secondary | ICD-10-CM | POA: Diagnosis not present

## 2024-04-10 DIAGNOSIS — M7989 Other specified soft tissue disorders: Secondary | ICD-10-CM | POA: Insufficient documentation

## 2024-04-10 DIAGNOSIS — F1090 Alcohol use, unspecified, uncomplicated: Secondary | ICD-10-CM | POA: Insufficient documentation

## 2024-04-10 DIAGNOSIS — E782 Mixed hyperlipidemia: Secondary | ICD-10-CM | POA: Insufficient documentation

## 2024-04-10 DIAGNOSIS — I2609 Other pulmonary embolism with acute cor pulmonale: Secondary | ICD-10-CM | POA: Diagnosis not present

## 2024-04-10 DIAGNOSIS — R9431 Abnormal electrocardiogram [ECG] [EKG]: Secondary | ICD-10-CM | POA: Insufficient documentation

## 2024-04-10 DIAGNOSIS — R7989 Other specified abnormal findings of blood chemistry: Secondary | ICD-10-CM | POA: Insufficient documentation

## 2024-04-10 DIAGNOSIS — E1165 Type 2 diabetes mellitus with hyperglycemia: Secondary | ICD-10-CM | POA: Insufficient documentation

## 2024-04-10 LAB — GLUCOSE, CAPILLARY
Glucose-Capillary: 101 mg/dL — ABNORMAL HIGH (ref 70–99)
Glucose-Capillary: 123 mg/dL — ABNORMAL HIGH (ref 70–99)
Glucose-Capillary: 76 mg/dL (ref 70–99)
Glucose-Capillary: 113 mg/dL — ABNORMAL HIGH (ref 70–99)
Glucose-Capillary: 142 mg/dL — ABNORMAL HIGH (ref 70–99)

## 2024-04-10 LAB — COMPREHENSIVE METABOLIC PANEL WITH GFR
ALT: 19 U/L (ref 0–44)
AST: 29 U/L (ref 15–41)
Albumin: 3.4 g/dL — ABNORMAL LOW (ref 3.5–5.0)
Alkaline Phosphatase: 66 U/L (ref 38–126)
Anion gap: 10 (ref 5–15)
BUN: 12 mg/dL (ref 8–23)
CO2: 27 mmol/L (ref 22–32)
Calcium: 8.7 mg/dL — ABNORMAL LOW (ref 8.9–10.3)
Chloride: 104 mmol/L (ref 98–111)
Creatinine, Ser: 1.11 mg/dL (ref 0.61–1.24)
GFR, Estimated: >60 mL/min (ref >60)
Glucose, Bld: 161 mg/dL — ABNORMAL HIGH (ref 70–99)
Potassium: 4 mmol/L (ref 3.5–5.1)
Sodium: 141 mmol/L (ref 135–145)
Total Bilirubin: 0.5 mg/dL (ref 0.0–1.2)
Total Protein: 6.6 g/dL (ref 6.5–8.1)

## 2024-04-10 LAB — CBC
HCT: 41.1 % (ref 39.0–52.0)
Hemoglobin: 13.2 g/dL (ref 13.0–17.0)
MCH: 26.1 pg (ref 26.0–34.0)
MCHC: 32.1 g/dL (ref 30.0–36.0)
MCV: 81.2 fL (ref 80.0–100.0)
Platelets: 258 10*3/uL (ref 150–400)
RBC: 5.06 MIL/uL (ref 4.22–5.81)
RDW: 16.8 % — ABNORMAL HIGH (ref 11.5–15.5)
WBC: 5.8 10*3/uL (ref 4.0–10.5)
nRBC: 0 % (ref 0.0–0.2)

## 2024-04-10 LAB — ECHOCARDIOGRAM COMPLETE
AR max vel: 2.34 cm2
AV Area VTI: 2.21 cm2
AV Area mean vel: 2.52 cm2
AV Mean grad: 2.6 mmHg
AV Peak grad: 5.2 mmHg
Ao pk vel: 1.13 m/s
Area-P 1/2: 4.57 cm2
Calc EF: 31 %
Height: 66 in
MV M vel: 4.48 m/s
MV Peak grad: 80.3 mmHg
Radius: 0.6 cm
S' Lateral: 6.6 cm
Single Plane A2C EF: 29.9 %
Single Plane A4C EF: 32.1 %
Weight: 2395.08 [oz_av]

## 2024-04-10 LAB — PHOSPHORUS: Phosphorus: 3.8 mg/dL (ref 2.5–4.6)

## 2024-04-10 LAB — TROPONIN I (HIGH SENSITIVITY)
Troponin I (High Sensitivity): 37 ng/L — ABNORMAL HIGH (ref <18)
Troponin I (High Sensitivity): 37 ng/L — ABNORMAL HIGH (ref <18)

## 2024-04-10 LAB — MAGNESIUM: Magnesium: 2.2 mg/dL (ref 1.7–2.4)

## 2024-04-10 MED ORDER — HYDROMORPHONE HCL 1 MG/ML IJ SOLN
0.5000 mg | INTRAMUSCULAR | Status: DC | PRN
  Filled 2024-04-10: qty 0.5

## 2024-04-10 MED ORDER — ACETAMINOPHEN 650 MG RE SUPP: 650.0000 mg | Freq: Four times a day (QID) | RECTAL | Status: DC | PRN

## 2024-04-10 MED ORDER — APIXABAN 5 MG PO TABS
10.0000 mg | ORAL_TABLET | Freq: Two times a day (BID) | ORAL | Status: DC
  Administered 2024-04-10 – 2024-04-11 (×2): 10 mg via ORAL
  Filled 2024-04-10 (×2): qty 2

## 2024-04-10 MED ORDER — LEVOTHYROXINE SODIUM 100 MCG PO TABS
100.0000 ug | ORAL_TABLET | Freq: Every day | ORAL | Status: DC
  Administered 2024-04-10 – 2024-04-14 (×5): 100 ug via ORAL
  Filled 2024-04-10 (×5): qty 1

## 2024-04-10 MED ORDER — EZETIMIBE 10 MG PO TABS
10.0000 mg | ORAL_TABLET | Freq: Every day | ORAL | Status: DC
  Administered 2024-04-10 – 2024-04-14 (×5): 10 mg via ORAL
  Filled 2024-04-10 (×5): qty 1

## 2024-04-10 MED ORDER — ACETAMINOPHEN 325 MG PO TABS
650.0000 mg | ORAL_TABLET | Freq: Four times a day (QID) | ORAL | Status: DC | PRN
  Administered 2024-04-10 – 2024-04-14 (×5): 650 mg via ORAL
  Filled 2024-04-10 (×5): qty 2

## 2024-04-10 MED ORDER — ONDANSETRON HCL 4 MG PO TABS
4.0000 mg | ORAL_TABLET | Freq: Four times a day (QID) | ORAL | Status: AC | PRN
Start: 2024-04-10 — End: ?

## 2024-04-10 MED ORDER — APIXABAN 5 MG PO TABS: 5.0000 mg | ORAL_TABLET | Freq: Two times a day (BID) | ORAL | Status: DC

## 2024-04-10 MED ORDER — INSULIN ASPART 100 UNIT/ML IJ SOLN
0.0000 [IU] | Freq: Three times a day (TID) | INTRAMUSCULAR | Status: DC
  Administered 2024-04-10 – 2024-04-11 (×2): 2 [IU] via SUBCUTANEOUS
  Administered 2024-04-12: 3 [IU] via SUBCUTANEOUS
  Administered 2024-04-13: 2 [IU] via SUBCUTANEOUS

## 2024-04-10 MED ORDER — ONDANSETRON HCL 4 MG/2ML IJ SOLN: 4.0000 mg | Freq: Four times a day (QID) | INTRAMUSCULAR | Status: DC | PRN

## 2024-04-10 MED ORDER — AMIODARONE HCL 200 MG PO TABS
200.0000 mg | ORAL_TABLET | Freq: Every day | ORAL | Status: DC
  Administered 2024-04-10 – 2024-04-14 (×5): 200 mg via ORAL
  Filled 2024-04-10 (×5): qty 1

## 2024-04-10 MED ORDER — EMPAGLIFLOZIN 10 MG PO TABS
10.0000 mg | ORAL_TABLET | Freq: Every day | ORAL | Status: DC
  Administered 2024-04-10 – 2024-04-13 (×4): 10 mg via ORAL
  Filled 2024-04-10 (×4): qty 1

## 2024-04-10 MED ORDER — PERFLUTREN LIPID MICROSPHERE
1.0000 mL | INTRAVENOUS | Status: AC | PRN
  Administered 2024-04-10: 3 mL via INTRAVENOUS

## 2024-04-10 MED ORDER — PANTOPRAZOLE SODIUM 40 MG PO TBEC
40.0000 mg | DELAYED_RELEASE_TABLET | Freq: Every day | ORAL | Status: DC
  Administered 2024-04-10 – 2024-04-14 (×5): 40 mg via ORAL
  Filled 2024-04-10 (×5): qty 1

## 2024-04-10 NOTE — Hospital Course (Signed)
 Male with a history of HFrEF (25-30%), coronary disease with multiple stents,NS Vtach  St. Jude ICD in place, history of PE, HTN, HLD, Type 2 DM, Stage 3 CKD, prior LV thrombus, prior polysubstance abuse presenting with chest tightness/sob and lightheaded.   68 y.o. male with medical history significant of hypertension, hyperlipidemia, CAD s/p stent placement, T2DM, alcohol use disorder, chronic combined systolic and diastolic CHF who presents to the emergency department via EMS due to complaint of chest tightness, shortness of breath, increased leg swelling and some dizziness that has been ongoing for several days.  Patient states symptoms were similar to prior hospitalizations.  Chest pain was reproducible and left-sided, he endorsed increased weight gain (about 10 pounds).  Patient states that he had 1 beer PTA.  He was suppose to be apixaban  PTA, but had not been refilled since April 2025.  He reported that Eliquis  was stopped by Dr Rodolfo Clan for ICD procedure and he also might have skipped Eliquis  dose.   His hospitalization was prolonged due to reported rectal bleed.  His apixaban  was switched to IV heparin  and he was monitored closely.  His Hgb remained stable and he did not have any further hematochezia.  He was transitioned to apixaban  and monitored >24 hours back on apixaban  without difficulty.

## 2024-04-10 NOTE — Progress Notes (Signed)
*  PRELIMINARY RESULTS* Echocardiogram 2D Echocardiogram has been performed with Definity .  Bernis Brisker 04/10/2024, 3:09 PM

## 2024-04-10 NOTE — Progress Notes (Signed)
 Progress Note   Patient: Daniel Li WUJ:[id number] DOB: 1955-12-21 DOA: 04/09/2024     1 DOS: the patient was seen and examined on 04/10/2024   Brief hospital course: 68 y.o. male with medical history significant of hypertension, hyperlipidemia, CAD s/p stent placement, T2DM, alcohol use disorder, chronic combined systolic and diastolic CHF who presents to the emergency department via EMS due to complaint of chest tightness, shortness of breath, increased leg swelling and some dizziness that has been ongoing for several days.  Patient states symptoms were similar to prior hospitalizations.  Chest pain was reproducible and left-sided, he endorsed increased weight gain (about 10 pounds).  Patient states that he had 1 beer PTA.  He was on Eliquis  at home, but was unsure if he was compliant with this.   Assessment and Plan: Acute pulmonary embolism CT angiography chest with contrast showed acute  pulmonary embolism in a segmental branch of the left lower lobe pulmonary artery. No evidence of right heart strain. Patient was given dose of lovenox  in ED 2d echo with normal RV function, no evidence of RH strain Chart reviewed. Pt was last filled eliquis  in 4/25, none since Will not consider this Eliquis  failure, as pt was not taking Eliquis  at time of PE. Will resume Eliquis  Bilateral lower extremity ultrasound neg for DVT   Hypokalemia Replaced   Hx of chronic combined systolic and diastolic CHF Ischemic cardiomyopathy Patient is s/p St. Jude ICD He appears to be euvolemic Continue total input/output, daily weights and fluid restriction Continue heart healthy/carb modified diet       Echocardiogram done on 01/23/2024 showed LVEF of 20 to 25%. + 2d echo reviewed, EF is essentially unchanged   Prolonged QT interval QTc 534 ms Avoid QT prolonging drugs   Elevated troponin possibly secondary to type II demand ischemia Troponin 35 > 36   Chest pain Noted to be pleuritic in  nature Suspect secondary to acute PE   Left leg swelling -LE dopplers neg for DVT   Alcohol use disorder Alcohol level 68, he denies habitual alcohol use Continue to monitor for alcohol withdrawal and decide on starting patient on CIWA protocol   CAD  Extensive CAD history with prior stents Troponin is flattened: 35 > 37 > 37 Continue IV Dilaudid  0.5 mg every 4 hours as needed for moderate to severe pain Last cath in 2023 with patent stents, occluded LCX and moderate diffuse disease elsewhere treated medically Patient was on Eliquis , continued   Type 2 diabetes mellitus with hyperglycemia Hemoglobin A1c 6.8 on 01/22/2024 Continue Jardiance  Cont SSI as needed   Mixed hyperlipidemia Continue Zetia    History of NSVT Patient has ICD, continue with amiodarone .   Acquired hypothyroidism Continue Synthroid    GERD Continue Protonix          Subjective: Still feeling sob, worse when trying to reposition himself in bed  Physical Exam: Vitals:   04/09/24 2342 04/10/24 0416 04/10/24 0432 04/10/24 1528  BP: 121/84 95/71 100/63 (!) 87/56  Pulse: 68 61 62 61  Resp: 18 15 18 20   Temp: 98.2 F (36.8 C) 97.9 F (36.6 C) 98.4 F (36.9 C) 98.4 F (36.9 C)  TempSrc: Oral Oral Oral Oral  SpO2: 96% 97% 99%   Weight: 67.9 kg  67.9 kg   Height: 5' 6 (1.676 m)      General exam: Awake, laying in bed, in nad Respiratory system: Normal respiratory effort, no wheezing Cardiovascular system: regular rate, s1, s2 Gastrointestinal system: Soft, nondistended, positive BS  Central nervous system: CN2-12 grossly intact, strength intact Extremities: Perfused, no clubbing Skin: Normal skin turgor, no notable skin lesions seen Psychiatry: Mood normal // no visual hallucinations   Data Reviewed:  Labs reviewed: Na 141, K 4.0, Cr 1.11, WBC 5.8, Hgb 13.2  Family Communication: Pt in room, family not at bedside  Disposition: Status is: Inpatient Remains inpatient appropriate because:  severity of illness  Planned Discharge Destination: Group Home     Author: Cherylle Corwin, MD 04/10/2024 6:17 PM  For on call review [email or web address].

## 2024-04-10 NOTE — Plan of Care (Signed)
  Problem: Acute Rehab PT Goals(only PT should resolve) Goal: Pt Will Go Supine/Side To Sit Outcome: Progressing Flowsheets (Taken 04/10/2024 1407) Pt will go Supine/Side to Sit:  Independently  with modified independence Goal: Patient Will Transfer Sit To/From Stand Outcome: Progressing Flowsheets (Taken 04/10/2024 1407) Patient will transfer sit to/from stand:  with supervision  with modified independence Goal: Pt Will Transfer Bed To Chair/Chair To Bed Outcome: Progressing Flowsheets (Taken 04/10/2024 1407) Pt will Transfer Bed to Chair/Chair to Bed:  with modified independence  with supervision Goal: Pt Will Ambulate Outcome: Progressing Flowsheets (Taken 04/10/2024 1407) Pt will Ambulate:  75 feet  with supervision  with rolling walker  with least restrictive assistive device   2:07 PM, 04/10/24 Walton Guppy, MPT Physical Therapist with [facility] [phone] office [phone] mobile phone

## 2024-04-10 NOTE — Evaluation (Signed)
 Physical Therapy Evaluation Patient Details Name: Daniel Li MRN: [id number] DOB: Feb 09, 1956 Today's Date: 04/10/2024  History of Present Illness  Daniel Li is a 68 y.o. male with medical history significant of hypertension, hyperlipidemia, CAD s/p stent placement, T2DM, alcohol use disorder, chronic combined systolic and diastolic CHF who presents to the emergency department via EMS due to complaint of chest tightness, shortness of breath, increased leg swelling and some dizziness that has been ongoing for several days.  Patient states symptoms were similar to prior hospitalizations.  Chest pain was reproducible and left-sided, he endorsed increased weight gain (about 10 pounds).  Patient states that he had 1 beer PTA.  He was on Eliquis  at home, but was unsure if he was compliant with this.   Clinical Impression  Patient had difficulty balancing using his quad-cane due to right knee pain with buckling, required use of RW for safety and able to ambulate in hallway without loss of balance, but limited mostly due to c/o dizziness and had to return to room.  Patient's sitting BP after walking at 109/78, standing 103/86, patient on room air during ambulation with SpO2 at 98% and left on room air sitting in chair - nurse notified. Patient will benefit from continued skilled physical therapy in hospital and recommended venue below to increase strength, balance, endurance for safe ADLs and gait.         If plan is discharge home, recommend the following: A little help with walking and/or transfers;A little help with bathing/dressing/bathroom;Help with stairs or ramp for entrance;Assistance with cooking/housework   Can travel by private vehicle        Equipment Recommendations None recommended by PT  Recommendations for Other Services       Functional Status Assessment Patient has had a recent decline in their functional status and demonstrates the ability to make significant  improvements in function in a reasonable and predictable amount of time.     Precautions / Restrictions Precautions Precautions: Fall Recall of Precautions/Restrictions: Intact Restrictions Weight Bearing Restrictions Per Provider Order: No      Mobility  Bed Mobility Overal bed mobility: Modified Independent                  Transfers Overall transfer level: Needs assistance Equipment used: Quad cane, Rolling walker (2 wheels) Transfers: Sit to/from Stand, Bed to chair/wheelchair/BSC Sit to Stand: Contact guard assist   Step pivot transfers: Contact guard assist       General transfer comment: unsteady labored movement using his quad-cane with buckling of RLE due to increasing pain, safer using RW    Ambulation/Gait Ambulation/Gait assistance: Contact guard assist, Min assist Gait Distance (Feet): 40 Feet Assistive device: Rolling walker (2 wheels) Gait Pattern/deviations: Decreased step length - right, Decreased step length - left, Decreased stride length, Decreased stance time - right, Knees buckling Gait velocity: decreased     General Gait Details: slow labored movement with mild buckling of RLE due to medial pain, no loss of balance and limited mostly due to c/o dizziness, fatigue  Stairs            Wheelchair Mobility     Tilt Bed    Modified Rankin (Stroke Patients Only)       Balance Overall balance assessment: Needs assistance Sitting-balance support: Feet supported, No upper extremity supported Sitting balance-Leahy Scale: Good Sitting balance - Comments: seated at EOB   Standing balance support: Reliant on assistive device for balance, During functional activity, Bilateral upper extremity  supported Standing balance-Leahy Scale: Poor Standing balance comment: using  his quad-cane, fair using RW                             Pertinent Vitals/Pain Pain Assessment Pain Assessment: Faces Faces Pain Scale: Hurts even  more Pain Location: medial distal right leg above knee Pain Descriptors / Indicators: Sore, Discomfort, Grimacing Pain Intervention(s): Limited activity within patient's tolerance, Monitored during session, Repositioned    Home Living Family/patient expects to be discharged to:: Assisted living                 Home Equipment: Agricultural consultant (2 wheels);Cane - quad;Cane - single point;BSC/3in1;Shower seat;Grab bars - toilet;Grab bars - tub/shower;Hand held shower head      Prior Function Prior Level of Function : Needs assist       Physical Assist : Mobility (physical);ADLs (physical) Mobility (physical): Bed mobility;Transfers;Gait;Stairs   Mobility Comments: Household ambulation using quad-cane ADLs Comments: PRN assist with ADL, walks to the dinning room, assist with meds.     Extremity/Trunk Assessment   Upper Extremity Assessment Upper Extremity Assessment: Defer to OT evaluation    Lower Extremity Assessment Lower Extremity Assessment: Generalized weakness;RLE deficits/detail RLE Deficits / Details: grossly -4/5 RLE: Unable to fully assess due to pain RLE Sensation: WNL RLE Coordination: WNL    Cervical / Trunk Assessment Cervical / Trunk Assessment: Kyphotic  Communication   Communication Communication: No apparent difficulties    Cognition Arousal: Alert Behavior During Therapy: WFL for tasks assessed/performed   PT - Cognitive impairments: No apparent impairments                         Following commands: Intact       Cueing Cueing Techniques: Verbal cues, Tactile cues     General Comments      Exercises     Assessment/Plan    PT Assessment Patient needs continued PT services  PT Problem List Decreased strength;Decreased activity tolerance;Decreased balance;Decreased mobility       PT Treatment Interventions DME instruction;Gait training;Stair training;Functional mobility training;Therapeutic activities;Therapeutic  exercise;Balance training;Patient/family education    PT Goals (Current goals can be found in the Care Plan section)  Acute Rehab PT Goals Patient Stated Goal: return home with ALF staff to assist PT Goal Formulation: With patient Time For Goal Achievement: 04/13/24 Potential to Achieve Goals: Good    Frequency Min 3X/week     Co-evaluation               AM-PAC PT 6 Clicks Mobility  Outcome Measure Help needed turning from your back to your side while in a flat bed without using bedrails?: None Help needed moving from lying on your back to sitting on the side of a flat bed without using bedrails?: None Help needed moving to and from a bed to a chair (including a wheelchair)?: A Little Help needed standing up from a chair using your arms (e.g., wheelchair or bedside chair)?: A Little Help needed to walk in hospital room?: A Lot Help needed climbing 3-5 steps with a railing? : A Lot 6 Click Score: 18    End of Session   Activity Tolerance: Patient tolerated treatment well;Patient limited by fatigue Patient left: in chair;with call bell/phone within reach Nurse Communication: Mobility status PT Visit Diagnosis: Unsteadiness on feet (R26.81);Other abnormalities of gait and mobility (R26.89);Muscle weakness (generalized) (M62.81)    Time: 3086-5784 PT  Time Calculation (min) (ACUTE ONLY): 33 min   Charges:   PT Evaluation $PT Eval Moderate Complexity: 1 Mod PT Treatments $Therapeutic Activity: 23-37 mins PT General Charges $$ ACUTE PT VISIT: 1 Visit         2:06 PM, 04/10/24 Daniel Li, MPT Physical Therapist with [facility] [phone] office [phone] mobile phone

## 2024-04-10 NOTE — Consult Note (Signed)
 PHARMACY - ANTICOAGULATION CONSULT NOTE  Pharmacy Consult for Apixaban  Indication: pulmonary embolus  Allergies  Allergen Reactions   Pork-Derived Products Other (See Comments)    Unknown     Patient Measurements: Height: 5' 6 (167.6 cm) Weight: 67.9 kg (149 lb 11.1 oz) IBW/kg (Calculated) : 63.8 HEPARIN  DW (KG): 67.9  Vital Signs: Temp: 98.4 F (36.9 C) (06/13 1528) Temp Source: Oral (06/13 1528) BP: 87/56 (06/13 1528) Pulse Rate: 61 (06/13 1528)  Labs: Recent Labs    04/09/24 1923 04/09/24 2114 04/10/24 0041 04/10/24 0223  HGB 12.9*  --   --  13.2  HCT 39.7  --   --  41.1  PLT 261  --   --  258  CREATININE 1.25*  --   --  1.11  TROPONINIHS 35* 36* 37* 37*    Estimated Creatinine Clearance: 58.3 mL/min (by C-G formula based on SCr of 1.11 mg/dL).   Medical History: Past Medical History:  Diagnosis Date   AICD (automatic cardioverter/defibrillator) present    Anxiety    Arthritis    all over (09/17/2017)   Burn    CAD (coronary artery disease)    a. NSTEMI s/p BMS to 1st Diagonal and distal OM2 in 2007; b. STEMI 03/26/12 s/p BMS to RCA; c. NSTEMI 10/2012 : CTO of LCx (unable to open with PCI) and PL branch, mod dz of LAD and diagonal, and preserved LV systolic fxn, Med Rx;  d.  anterior STEMI in 01/2017 with DES to Proximal LAD   CAD in native artery 08/23/2020   Cardiomyopathy EF 35% on cath 06/30/14, new from jan 2015 07/20/2014   Chest pain    CHF (congestive heart failure) (HCC)    Chronic back pain    all over (09/17/2017)   DDD (degenerative disc disease), cervical    Depression    GERD (gastroesophageal reflux disease)    Headache    a few/wk (09/17/2017)   HTN (hypertension)    Hypercholesterolemia    Mental disorder    Myocardial infarction (HCC)    I've had 7 (09/17/2017)   Pulmonary edema    Respiratory failure (HCC)    Sciatic pain    Sleep apnea    Stroke [facility]    a. multiple dating back to 2002; *I''ve had 5; LUE/LLE weaker  since (09/17/2017)   Tibia fracture (l) leg   Tobacco abuse    Type 2 diabetes mellitus (HCC)    Unstable angina (HCC)     Medications:  Noted enoxaparin  1mg /kg [place] x 1 dose given 6/12 @ 2226. No additional anticoagulation given today.  Assessment: Patient presents to ED with chest tightness, shortness of breath, increased leg swelling and some dizziness. PMH includes hypertension, hyperlipidemia, CAD s/p stent placement, T2DM, alcohol use disorder, chronic combined systolic and diastolic CHF   Baseline CMET and CBC showed LFT , Hgb and plt within normal limits. CrCl 58 ml/min  Plan:  Eliquis  10mg  po BID x 7 days, then 5mg  po BID  Harinder Romas Rodriguez-Guzman PharmD, BCPS 04/10/2024 6:26 PM

## 2024-04-10 NOTE — TOC Initial Note (Signed)
 Transition of Care [facility]) - Initial/Assessment Note    Patient Details  Name: Daniel Li MRN: [id number] Date of Birth: 03/26/1956  [facility] CM/SW Contact:    Orelia Binet, RN Phone Number: 04/10/2024, 1:17 PM  Clinical Narrative:        Patient admitted from [facility]. PT is recommending HHPT. CM spoke with caregiver at the facility. They are agreeable to home health PT, he has had in the past with [facility]. [facility] accepted the new referral. MD aware to order. They will accept the patient back anytime he is medically ready, they might have transportation it will just depend on time and day. TOC following.             Barriers to Discharge: Barriers Resolved   Patient Goals and CMS Choice Patient states their goals for this hospitalization and ongoing recovery are:: return to group home          Expected Discharge Plan and Services        Prior Living Arrangements/Services     Patient language and need for interpreter reviewed:: Yes        Need for Family Participation in Patient Care: Yes (Comment) Care giver support system in place?: Yes (comment)   Criminal Activity/Legal Involvement Pertinent to Current Situation/Hospitalization: No - Comment as needed  Activities of Daily Living   ADL Screening (condition at time of admission) Independently performs ADLs?: Yes (appropriate for developmental age) Is the patient deaf or have difficulty hearing?: No Does the patient have difficulty seeing, even when wearing glasses/contacts?: No Does the patient have difficulty concentrating, remembering, or making decisions?: No  Permission Sought/Granted          Emotional Assessment     Affect (typically observed): Accepting Orientation: : Oriented to Self, Oriented to Place, Oriented to Situation Alcohol / Substance Use: Not Applicable Psych Involvement: No (comment)  Admission diagnosis:  Hypokalemia [E87.6] Leg swelling  [M79.89] Acute pulmonary embolism (HCC) [I26.99] Other acute pulmonary embolism, unspecified whether acute cor pulmonale present (HCC) [I26.99] Patient Active Problem List   Diagnosis Date Noted   Elevated d-dimer 04/10/2024   Hypokalemia 04/10/2024   Elevated brain natriuretic peptide (BNP) level 04/10/2024   Left leg swelling 04/10/2024   Alcohol use disorder 04/10/2024   Type 2 diabetes mellitus with hyperglycemia (HCC) 04/10/2024   Mixed hyperlipidemia 04/10/2024   Prolonged QT interval 04/10/2024   Acute pulmonary embolism (HCC) 04/09/2024   Anemia 03/14/2024   Hypotension due to hypovolemia 03/14/2024   Acquired hypothyroidism 03/13/2024   History of CVA with residual deficit 03/13/2024   Internal carotid artery occlusion 03/13/2024   Near syncope  Dizziness 03/12/2024   Chronic health problem 03/12/2024   AAA (abdominal aortic aneurysm) without rupture (HCC) 03/03/2024   Moderate mitral insufficiency 08/07/2023   Cocaine positive 12/09/2022   Mitral regurgitation 03/22/2022   CAD in native artery 08/23/2020   Unstable angina (HCC) 08/22/2020   Status post coronary artery stent placement    Hyponatremia    GERD (gastroesophageal reflux disease)    Dehydration    Acute on chronic renal failure (HCC) 05/25/2018   Abdominal pain, acute, epigastric 05/17/2018   Hernia of abdominal wall 05/17/2018   Presumed Gastritis 05/17/2018   Generalized abdominal pain    Elevated troponin    Chest pain 04/29/2018   ICD (implantable cardioverter-defibrillator) in place 03/05/2018   Anticoagulated on Coumadin  03/05/2018   Non-STEMI (non-ST elevated myocardial infarction) (HCC) 02/12/2018   Precordial chest  pain 02/11/2018   Chronic systolic CHF (congestive heart failure) (HCC) 02/11/2018   Chronic combined systolic (congestive) and diastolic (congestive) heart failure (HCC)    STEMI (ST elevation myocardial infarction) (HCC) 02/21/2017   Ischemic cardiomyopathy 07/20/2014   High  risk medication use 01/28/2013   Special screening for malignant neoplasms, colon 01/28/2013   Hypercholesterolemia    Tobacco abuse    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   Benign essential HTN 03/26/2012   CAD S/P percutaneous coronary angioplasty 03/26/2012   PCP:  Orlena Bitters, MD Pharmacy:   [facility] [place] - [address] [place] [address] Phone: [phone] Fax: [phone]  [facility] [address] [place] [address] Phone: [phone] Fax: [phone]     Social Drivers of Health (SDOH) Social History: SDOH Screenings   Food Insecurity: No Food Insecurity (04/09/2024)  Housing: Low Risk  (04/09/2024)  Transportation Needs: No Transportation Needs (04/09/2024)  Recent Concern: Transportation Needs - Unmet Transportation Needs (01/22/2024)  Utilities: Not At Risk (04/09/2024)  Depression (PHQ2-9): Low Risk  (11/05/2023)  Financial Resource Strain: Low Risk  (02/25/2023)   Received from [facility]  Social Connections: Moderately Isolated (04/09/2024)  Tobacco Use: Medium Risk (04/09/2024)   SDOH Interventions:    Readmission Risk Interventions     No data to display

## 2024-04-10 NOTE — Progress Notes (Signed)
   04/10/24 0416  Vitals  Temp 97.9 F (36.6 C)  Temp Source Oral  BP 95/71  MAP (mmHg) 80  BP Location Right Arm  BP Method Automatic  Patient Position (if appropriate) Sitting  Pulse Rate 61  Pulse Rate Source Monitor  ECG Heart Rate 61  Resp 15  Level of Consciousness  Level of Consciousness Alert  MEWS COLOR  MEWS Score Color Green  Oxygen  Therapy  SpO2 97 %  O2 Device Nasal Cannula  O2 Flow Rate (L/min) 2 L/min  Pain Assessment  Pain Scale 0-10  Pain Score 9  Pain Type Acute pain  Pain Location Chest  Pain Descriptors / Indicators Pressure  MEWS Score  MEWS Temp 0  MEWS Systolic 1  MEWS Pulse 0  MEWS RR 0  MEWS LOC 0  MEWS Score 1

## 2024-04-11 DIAGNOSIS — K625 Hemorrhage of anus and rectum: Secondary | ICD-10-CM | POA: Diagnosis not present

## 2024-04-11 DIAGNOSIS — I2699 Other pulmonary embolism without acute cor pulmonale: Secondary | ICD-10-CM | POA: Diagnosis not present

## 2024-04-11 DIAGNOSIS — Z7901 Long term (current) use of anticoagulants: Secondary | ICD-10-CM

## 2024-04-11 DIAGNOSIS — F1090 Alcohol use, unspecified, uncomplicated: Secondary | ICD-10-CM | POA: Diagnosis not present

## 2024-04-11 LAB — GLUCOSE, CAPILLARY
Glucose-Capillary: 137 mg/dL — ABNORMAL HIGH (ref 70–99)
Glucose-Capillary: 96 mg/dL (ref 70–99)
Glucose-Capillary: 111 mg/dL — ABNORMAL HIGH (ref 70–99)
Glucose-Capillary: 159 mg/dL — ABNORMAL HIGH (ref 70–99)

## 2024-04-11 MED ORDER — HEPARIN (PORCINE) 25000 UT/250ML-% IV SOLN
1000.0000 [IU]/h | INTRAVENOUS | Status: DC
  Administered 2024-04-11 – 2024-04-13 (×2): 1000 [IU]/h via INTRAVENOUS
  Filled 2024-04-11 (×2): qty 250

## 2024-04-11 MED ORDER — DIGOXIN 125 MCG PO TABS
0.1250 mg | ORAL_TABLET | Freq: Every day | ORAL | Status: DC
  Administered 2024-04-11 – 2024-04-14 (×4): 0.125 mg via ORAL
  Filled 2024-04-11 (×4): qty 1

## 2024-04-11 NOTE — Progress Notes (Signed)
 PROGRESS NOTE  Daniel Li URK:[id number] DOB: 30-Jul-1956 DOA: 04/09/2024 PCP: Orlena Bitters, MD  Brief History:  Male with a history of HFrEF (25-30%), coronary disease with multiple stents,NS Vtach  St. Jude ICD in place, history of PE, HTN, HLD, Type 2 DM, Stage 3 CKD, prior LV thrombus, prior polysubstance abuse presenting with chest tightness/have recently edema.   68 y.o. male with medical history significant of hypertension, hyperlipidemia, CAD s/p stent placement, T2DM, alcohol use disorder, chronic combined systolic and diastolic CHF who presents to the emergency department via EMS due to complaint of chest tightness, shortness of breath, increased leg swelling and some dizziness that has been ongoing for several days.  Patient states symptoms were similar to prior hospitalizations.  Chest pain was reproducible and left-sided, he endorsed increased weight gain (about 10 pounds).  Patient states that he had 1 beer PTA.  He was on Eliquis  at home, but was unsure if he was compliant with this.     closely by AHF with most recent visit on 12/26/2023. Reported overall doing well at that time and was residing in a group home. No changes were made to his cardiac medications and he was continued on Amiodarone  200mg  daily, Entresto  24-26 mg twice daily, Spironolactone  25 mg daily, Jardiance  10 mg daily, Digoxin  0.125 mg daily and Torsemide  20 mg daily. He had previously been on Coumadin  for LV thrombus and had been switched to Eliquis  and was continued on this.    Chronic HFrEF - His EF was at 20-25% by TEE in 07/2023. He does have an ICD in place and most recent device interrogation in 10/2023 showed normal device function. He appears euvolemic by examination today. -Continue current GDMT with Entresto  24-26 mg twice daily, Spironolactone  25 mg daily, Jardiance  10 mg daily and Digoxin  0.125 mg daily. Beta-blocker therapy previously discontinued given borderline low output. BP does  not allow for further titration of GDMT and anticipate restarting Torsemide  at discharge. He was listed as taking 40 mg daily at home and would resume at 20 mg daily as previously recommended by Advanced Heart Failure and also due to his elevated creatinine and low BP on admission which is concerning for dehydration.    Assessment/Plan: Acute pulmonary embolus - 04/09/2024 CTA chest--acute PE segmental branches of LLL PA -Chart reviewed. Pt was last filled eliquis  in 4/25, none since  -not consider this Eliquis  failure, as pt was not taking Eliquis  at time of PE.  - Lower extremity duplex negative for DVT - Restart apixaban   Rectal bleeding -reported by pt -unclear if he truly had rectal bleeding by pt's description -appreciate GI consult -discussed with Dr. Mordechai April -check hemoccult -monitor H/H -switch apixaban  to IV heparin  for now  Hypokalemia - Replating  Chronic HFpEF - Likely compensated - 04/10/2024 HK, normal RVF, moderate MR, mild TR -ICD in place and most recent device interrogation in 10/2023 showed normal device function  -Continue current GDMT with Entresto  24-26 mg twice daily, Spironolactone  25 mg daily, Jardiance  10 mg daily and Digoxin  0.125 mg daily. Beta-blocker therapy previously discontinued given borderline low output. BP does not allow for further titration of GDMT  - last visit with AHF clinic--Entresto  and spironolactone  were stopped secondary to her soft blood pressures.  Jardiance  and digoxin  were continued. - Resume torsemide  20 mg daily  History of LV thrombus -previously been on Coumadin  for LV thrombus and had been switched to Eliquis  and was continued on  this.    Elevated troponin - Secondary to demand ischemia - Troponin trend is flat - Troponins 35>> 36>> 37>> 37  Coronary artery disease - history of multiple interventions as discussed above and most recent cardiac catheterization in 2023 showed patent stents and 50% PLV and 50% PDA stenosis with  medical management recommended.  -not on ASA given the need for anticoagulation    NSVT - continued on Amiodarone  200mg  daily -s/p ICD  Alcohol abuse - Alcohol level 68 - No signs of withdrawal  Diabetes mellitus type 2 with hyperglycemia Hemoglobin A1c 6.8 on 01/22/2024 Continue Jardiance  Cont SSI as needed  Mixed hyperlipidemia -continue zetia   Hypothyroidism -continue synthroid        Family Communication: no  Family at bedside  Consultants:  GI  Code Status:  FULL /  DVT Prophylaxis:  IV Heparin     Procedures: As Listed in Progress Note Above  Antibiotics: None     Subjective: Patient denies fevers, chills, headache, chest pain, dyspnea, nausea, vomiting, diarrhea, abdominal pain, dysuria, hematuria,and melena.  Noted a spot of red with stool   Objective: Vitals:   04/10/24 1528 04/10/24 2023 04/11/24 0500 04/11/24 0557  BP: (!) 87/56 94/67  96/66  Pulse: 61 (!) 57  (!) 55  Resp: 20 16    Temp: 98.4 F (36.9 C) 98.4 F (36.9 C)  97.9 F (36.6 C)  TempSrc: Oral Oral  Oral  SpO2:  94%  90%  Weight:   67 kg   Height:        Intake/Output Summary (Last 24 hours) at 04/11/2024 0734 Last data filed at 04/11/2024 0500 Gross per 24 hour  Intake 780 ml  Output 2100 ml  Net -1320 ml   Weight change: -3.761 kg Exam:  General:  Pt is alert, follows commands appropriately, not in acute distress HEENT: No icterus, No thrush, No neck mass, /AT Cardiovascular: RRR, S1/S2, no rubs, no gallops Respiratory: CTA bilaterally, no wheezing, no crackles, no rhonchi Abdomen: Soft/+BS, non tender, non distended, no guarding Extremities: No edema, No lymphangitis, No petechiae, No rashes, no synovitis   Data Reviewed: I have personally reviewed following labs and imaging studies Basic Metabolic Panel: Recent Labs  Lab 04/06/24 1210 04/09/24 1923 04/10/24 0223  NA 143 140 141  K 4.2 3.0* 4.0  CL 110 103 104  CO2 27 22 27   GLUCOSE 43* 191* 161*   BUN 8 12 12   CREATININE 1.65* 1.25* 1.11  CALCIUM  8.7* 8.8* 8.7*  MG  --  2.2 2.2  PHOS  --   --  3.8   Liver Function Tests: Recent Labs  Lab 04/10/24 0223  AST 29  ALT 19  ALKPHOS 66  BILITOT 0.5  PROT 6.6  ALBUMIN 3.4*   No results for input(s): LIPASE, AMYLASE in the last 168 hours. No results for input(s): AMMONIA in the last 168 hours. Coagulation Profile: No results for input(s): INR, PROTIME in the last 168 hours. CBC: Recent Labs  Lab 04/09/24 1923 04/10/24 0223  WBC 6.7 5.8  HGB 12.9* 13.2  HCT 39.7 41.1  MCV 80.7 81.2  PLT 261 258   Cardiac Enzymes: No results for input(s): CKTOTAL, CKMB, CKMBINDEX, TROPONINI in the last 168 hours. BNP: Invalid input(s): POCBNP CBG: Recent Labs  Lab 04/10/24 1134 04/10/24 1528 04/10/24 1620 04/10/24 2040 04/11/24 0726  GLUCAP 123* 76 113* 142* 137*   HbA1C: No results for input(s): HGBA1C in the last 72 hours. Urine analysis:    Component Value Date/Time  COLORURINE YELLOW 03/12/2024 1837   APPEARANCEUR CLEAR 03/12/2024 1837   LABSPEC 1.023 03/12/2024 1837   PHURINE 5.0 03/12/2024 1837   GLUCOSEU >=500 (A) 03/12/2024 1837   HGBUR NEGATIVE 03/12/2024 1837   BILIRUBINUR NEGATIVE 03/12/2024 1837   KETONESUR NEGATIVE 03/12/2024 1837   PROTEINUR NEGATIVE 03/12/2024 1837   NITRITE NEGATIVE 03/12/2024 1837   LEUKOCYTESUR NEGATIVE 03/12/2024 1837   Sepsis Labs: @LABRCNTIP (procalcitonin:4,lacticidven:4) )No results found for this or any previous visit (from the past 240 hours).   Scheduled Meds:  amiodarone   200 mg Oral Daily   apixaban   10 mg Oral BID   Followed by   Cecily Cohen ON 04/17/2024] apixaban   5 mg Oral BID   empagliflozin   10 mg Oral Daily   ezetimibe   10 mg Oral Daily   insulin  aspart  0-15 Units Subcutaneous TID WC   levothyroxine   100 mcg Oral Q0600   pantoprazole   40 mg Oral Daily   Continuous Infusions:  Procedures/Studies: ECHOCARDIOGRAM COMPLETE Result Date:  04/10/2024    ECHOCARDIOGRAM REPORT   Patient Name:   Daniel Li Date of Exam: 04/10/2024 Medical Rec #:  [id number]           Height:       66.0 in Accession #:    [id number]          Weight:       149.7 lb Date of Birth:  1956/02/05           BSA:          1.768 m Patient Age:    67 years            BP:           100/63 mmHg Patient Gender: M                   HR:           62 bpm. Exam Location:  [facility] Procedure: 2D Echo, 3D Echo, Cardiac Doppler, Color Doppler and Intracardiac            Opacification Agent (Both Spectral and Color Flow Doppler were            utilized during procedure). Indications:    Pulmonary Embolus I26.09  History:        Patient has prior history of Echocardiogram examinations, most                 recent 01/23/2024. CHF and Cardiomyopathy, CAD and Previous                 Myocardial Infarction, Defibrillator, Stroke and TIA; Risk                 Factors:Hypertension, Diabetes, Dyslipidemia, Current Smoker and                 Sleep Apnea.  Sonographer:    Denese Finn RCS Referring Phys: [id number] OLADAPO ADEFESO IMPRESSIONS  1. Left ventricular ejection fraction, by estimation, is 25 to 30%. The left ventricle has normal function. The left ventricle demonstrates global hypokinesis. The left ventricular internal cavity size was severely dilated. Left ventricular diastolic parameters are indeterminate. Elevated left atrial pressure.  2. Right ventricular systolic function is normal. The right ventricular size is normal.  3. Left atrial size was severely dilated.  4. MR vena contraca is 0.6 cm. The mitral valve is abnormal. Moderate mitral valve regurgitation. No evidence of mitral stenosis.  5. The tricuspid valve is abnormal.  6. The aortic valve is tricuspid. Aortic valve regurgitation is not visualized. No aortic stenosis is present.  7. The inferior vena cava is normal in size with greater than 50% respiratory variability, suggesting right atrial pressure of 3 mmHg. FINDINGS   Left Ventricle: Left ventricular ejection fraction, by estimation, is 25 to 30%. The left ventricle has normal function. The left ventricle demonstrates global hypokinesis. Definity  contrast agent was given IV to delineate the left ventricular endocardial borders. The left ventricular internal cavity size was severely dilated. There is no left ventricular hypertrophy. Left ventricular diastolic parameters are indeterminate. Elevated left atrial pressure. Right Ventricle: The right ventricular size is normal. Right vetricular wall thickness was not well visualized. Right ventricular systolic function is normal. Left Atrium: Left atrial size was severely dilated. Right Atrium: Right atrial size was normal in size. Pericardium: There is no evidence of pericardial effusion. Mitral Valve: MR vena contraca is 0.6 cm. The mitral valve is abnormal. Moderate mitral valve regurgitation. No evidence of mitral valve stenosis. Tricuspid Valve: The tricuspid valve is abnormal. Tricuspid valve regurgitation is mild . No evidence of tricuspid stenosis. Aortic Valve: The aortic valve is tricuspid. Aortic valve regurgitation is not visualized. No aortic stenosis is present. Aortic valve mean gradient measures 2.6 mmHg. Aortic valve peak gradient measures 5.2 mmHg. Aortic valve area, by VTI measures 2.21 cm. Pulmonic Valve: The pulmonic valve was not well visualized. Pulmonic valve regurgitation is not visualized. No evidence of pulmonic stenosis. Aorta: The aortic root is normal in size and structure and the ascending aorta was not well visualized. Venous: The inferior vena cava is normal in size with greater than 50% respiratory variability, suggesting right atrial pressure of 3 mmHg. IAS/Shunts: No atrial level shunt detected by color flow Doppler.  LEFT VENTRICLE PLAX 2D LVIDd:         7.30 cm      Diastology LVIDs:         6.60 cm      LV e' medial:    4.90 cm/s LV PW:         0.90 cm      LV E/e' medial:  15.9 LV IVS:         0.80 cm      LV e' lateral:   3.59 cm/s LVOT diam:     2.10 cm      LV E/e' lateral: 21.7 LV SV:         49 LV SV Index:   28 LVOT Area:     3.46 cm                              3D Volume EF: LV Volumes (MOD)            3D EF:        36 % LV vol d, MOD A2C: 241.0 ml LV EDV:       282 ml LV vol d, MOD A4C: 299.0 ml LV ESV:       182 ml LV vol s, MOD A2C: 169.0 ml LV SV:        100 ml LV vol s, MOD A4C: 203.0 ml LV SV MOD A2C:     72.0 ml LV SV MOD A4C:     299.0 ml LV SV MOD BP:      83.5 ml RIGHT VENTRICLE RV S prime:     10.20 cm/s TAPSE (M-mode): 2.1 cm LEFT  ATRIUM              Index        RIGHT ATRIUM           Index LA diam:        4.40 cm  2.49 cm/m   RA Area:     16.50 cm LA Vol (A2C):   115.0 ml 65.04 ml/m  RA Volume:   41.90 ml  23.70 ml/m LA Vol (A4C):   84.3 ml  47.68 ml/m LA Biplane Vol: 102.0 ml 57.69 ml/m  AORTIC VALVE AV Area (Vmax):    2.34 cm AV Area (Vmean):   2.52 cm AV Area (VTI):     2.21 cm AV Vmax:           113.49 cm/s AV Vmean:          74.459 cm/s AV VTI:            0.223 m AV Peak Grad:      5.2 mmHg AV Mean Grad:      2.6 mmHg LVOT Vmax:         76.70 cm/s LVOT Vmean:        54.100 cm/s LVOT VTI:          0.142 m LVOT/AV VTI ratio: 0.64  AORTA Ao Root diam: 3.90 cm MITRAL VALVE MV Area (PHT): 4.57 cm       SHUNTS MV Decel Time: 166 msec       Systemic VTI:  0.14 m MR Peak grad:    80.3 mmHg    Systemic Diam: 2.10 cm MR Mean grad:    48.0 mmHg MR Vmax:         448.00 cm/s MR Vmean:        320.0 cm/s MR PISA:         2.26 cm MR PISA Eff ROA: 14 mm MR PISA Radius:  0.60 cm MV E velocity: 77.90 cm/s MV A velocity: 96.30 cm/s MV E/A ratio:  0.81 Armida Lander MD Electronically signed by Armida Lander MD Signature Date/Time: 04/10/2024/3:29:16 PM    Final    US  Venous Img Lower Bilateral (DVT) Result Date: 04/10/2024 CLINICAL DATA:  Right lower extremity edema EXAM: RIGHT LOWER EXTREMITY VENOUS DOPPLER ULTRASOUND TECHNIQUE: Gray-scale sonography with compression, as well as  color and duplex ultrasound, were performed to evaluate the deep venous system(s) from the level of the common femoral vein through the popliteal and proximal calf veins. COMPARISON:  None Available. FINDINGS: VENOUS Normal compressibility of the common femoral, superficial femoral, and popliteal veins, as well as the visualized calf veins. Visualized portions of profunda femoral vein and great saphenous vein unremarkable. No filling defects to suggest DVT on grayscale or color Doppler imaging. Doppler waveforms show normal direction of venous flow, normal respiratory plasticity and response to augmentation. Limited views of the contralateral common femoral vein are unremarkable. OTHER None. Limitations: none IMPRESSION: Negative. Electronically Signed   By: Fernando Hoyer M.D.   On: 04/10/2024 09:20   CT Angio Chest PE W and/or Wo Contrast Result Date: 04/09/2024 CLINICAL DATA:  Pulmonary embolism suspected. Positive D-dimer. Chest pain for 2-3 days. EXAM: CT ANGIOGRAPHY CHEST WITH CONTRAST TECHNIQUE: Multidetector CT imaging of the chest was performed using the standard protocol during bolus administration of intravenous contrast. Multiplanar CT image reconstructions and MIPs were obtained to evaluate the vascular anatomy. RADIATION DOSE REDUCTION: This exam was performed according to the departmental dose-optimization program which includes automated exposure control, adjustment  of the mA and/or kV according to patient size and/or use of iterative reconstruction technique. CONTRAST:  75mL OMNIPAQUE  IOHEXOL  350 MG/ML SOLN COMPARISON:  Chest radiograph 04/09/2024 and CTA chest 01/22/2024 FINDINGS: Cardiovascular: Nonocclusive lobar pulmonary embolism in the left lower lobe pulmonary artery with associated wispy stranding. This may represent chronic thromboembolism. Acute appearing pulmonary embolism in a segmental branch of the left lower lobe pulmonary artery. The left ventricle is dilated. No evidence of  right heart strain as there is compression of the right ventricle secondary to left ventricular enlargement. No pericardial effusion. Normal caliber thoracic aorta. Coronary artery and aortic atherosclerotic calcification. Left chest wall ICD. Mediastinum/Nodes: Trachea and esophagus are unremarkable. No thoracic adenopathy. Lungs/Pleura: Emphysema. Lower lobe atelectasis/scarring. The lungs are otherwise clear. No pleural effusion or pneumothorax. Upper Abdomen: No acute abnormality. Musculoskeletal: No acute fracture. Review of the MIP images confirms the above findings. IMPRESSION: 1. Acute pulmonary embolism in a segmental branch of the left lower lobe pulmonary artery. No evidence of right heart strain. Aortic Atherosclerosis (ICD10-I70.0) and Emphysema (ICD10-J43.9). Critical Value/emergent results were called by telephone at the time of interpretation on 04/09/2024 at 9:52 pm to provider Shyrl Doyne , who verbally acknowledged these results. Electronically Signed   By: Rozell Cornet M.D.   On: 04/09/2024 21:54   DG Chest 2 View Result Date: 04/09/2024 CLINICAL DATA:  Chest pain for 2 days, initial encounter EXAM: CHEST - 2 VIEW COMPARISON:  02/04/2024 FINDINGS: Cardiac shadow is within normal limits. Defibrillator is again seen. Lungs are well aerated bilaterally. No focal infiltrate or effusion is seen. No bony abnormality is noted. IMPRESSION: No active cardiopulmonary disease. Electronically Signed   By: Violeta Grey M.D.   On: 04/09/2024 19:56   VAS US  GROIN PSEUDOANEURYSM Result Date: 03/17/2024  ARTERIAL PSEUDOANEURYSM  Patient Name:  CHRISTORPHER HISAW  Date of Exam:   03/17/2024 Medical Rec #: [id number]            Accession #:    [id number] Date of Birth: 06-23-1956            Patient Gender: M Patient Age:   79 years Exam Location:  [facility] Procedure:      VAS US  GROIN PSEUDOANEURYSM Referring Phys: Thomos Flies  --------------------------------------------------------------------------------  Exam: Right groin Indications: Patient complains of groin pain. History: Right groin pain after cerebral arteriogram 5/19. Performing Technologist: Franky Ivanoff Sturdivant-Jones RDMS, RVT  Examination Guidelines: A complete evaluation includes B-mode imaging, spectral Doppler, color Doppler, and power Doppler as needed of all accessible portions of each vessel. Bilateral testing is considered an integral part of a complete examination. Limited examinations for reoccurring indications may be performed as noted. +------------+----------+---------+------+----------+ Right DuplexPSV (cm/s)Waveform PlaqueComment(s) +------------+----------+---------+------+----------+ CFA             86    triphasic                 +------------+----------+---------+------+----------+ PFA             54    triphasic                 +------------+----------+---------+------+----------+ Prox SFA        64    triphasic                 +------------+----------+---------+------+----------+ Right Vein comments:Patent right common femoral vein.  Summary: No evidence of pseudoaneurysm, AVF or DVT  Diagnosing physician: Genny Kid MD Electronically signed by Genny Kid MD on 03/17/2024 at 7:08:12 PM.   --------------------------------------------------------------------------------  Final    IR ANGIO INTRA EXTRACRAN SEL COM CAROTID INNOMINATE BILAT MOD SED Result Date: 03/17/2024 CLINICAL DATA:  Symptoms of dizziness and vertigo. Suspicion of high-grade stenosis of the intracranial internal carotid arteries. EXAM: BILATERAL COMMON CAROTID AND INNOMINATE ANGIOGRAPHY COMPARISON:  CT angiogram of the head and neck of 03/12/2024. MEDICATIONS: Heparin  1000 units IV; no antibiotic was administered within 1 hour of the procedure. ANESTHESIA/SEDATION: Versed  0 mg IV; Fentanyl  25 mcg IV, and Benadryl  25 mg IV. Moderate Sedation Time:  47 minutes The  patient was continuously monitored during the procedure by the interventional radiology nurse under my direct supervision. CONTRAST:  Omnipaque  300 approximately 65 mL. FLUOROSCOPY TIME:  Fluoroscopy Time: 17 minutes 42 seconds (398 mGy). COMPLICATIONS: None immediate. TECHNIQUE: Informed written consent was obtained from the patient after a thorough discussion of the procedural risks, benefits and alternatives. All questions were addressed. Maximal Sterile Barrier Technique was utilized including caps, mask, sterile gowns, sterile gloves, sterile drape, hand hygiene and skin antiseptic. A timeout was performed prior to the initiation of the procedure. The right groin was prepped and draped in the usual sterile fashion. Thereafter using modified Seldinger technique, transfemoral access into the right common femoral artery was obtained without difficulty. Over an 0.035 inch guidewire, a 5 French Pinnacle sheath was inserted. Through this, and also over an 0.035 inch guidewire, a 5 [place] JB 1 catheter was advanced to the aortic arch region and selectively positioned in the right common carotid artery, the right vertebral artery, the left common carotid artery and the left vertebral artery. FINDINGS: The innominate arteriogram demonstrates mild stenosis of the proximal innominate artery. The proximal right subclavian artery and the right common carotid artery demonstrate wide patency. The right common carotid arteriogram demonstrates the right external carotid artery and its major branches to be widely patent. The right common carotid artery at the bulb demonstrates a small ulcerated plaque along its medial wall. Distal to this the right internal carotid artery opacifies normally to the cranial skull base. The petrous, the cavernous and the supraclinoid right ICA are widely patent. The right middle cerebral artery and the right anterior cerebral artery opacify into the capillary and venous phases. The proximal right  vertebral artery demonstrates an approximately 20% stenosis just distal to its origin. The vessel opacifies to the right vertebrobasilar junction including a hypoplastic right posterior-inferior cerebellar artery. The opacified portion of the basilar artery, the posterior cerebral arteries, the superior cerebellar arteries and of the anterior inferior cerebellar arteries demonstrates patency into the capillary and venous phases. Unopacified blood is seen in the basilar artery from the contralateral vertebral artery. Suggestion of a right anterior inferior cerebellar artery/posteroinferior cerebellar artery complex is evident. The left subclavian arteriogram demonstrates mild stenosis proximally. The left vertebral artery demonstrates wide patency with moderate tortuosity just distal to its origin. Vessel opacifies to the cranial skull base. The left vertebrobasilar junction and the left posterior-inferior cerebellar artery demonstrate wide patency. The opacified portions of the basilar artery, the posterior cerebral arteries, the superior cerebellar and of the anterior-inferior cerebellar arteries demonstrate patency. Unopacified blood is seen in the basilar artery and distally from the contralateral vertebral artery. The left common carotid arteriogram demonstrates the left external carotid artery and its major branches to be widely patent. The left internal carotid artery at the bulb to the cranial skull base is widely patent. Wide patency is maintained of the petrous, the cavernous and the supraclinoid left ICA. The left middle and the left anterior cerebral  artery opacify into the capillary and venous phases. Suggestion of a mild focal stenosis is seen of a proximal branch of the superior division of the left middle cerebral artery. IMPRESSION: Wide patency of the supraclinoid ICAs bilaterally intracranially. Approximately 20% stenosis of the proximal right vertebral artery due to eccentric atherosclerotic  plaque. Suspicion of a mild stenosis of a proximal branch of the superior division of the left middle cerebral artery. PLAN: As per referring MD. Electronically Signed   By: Luellen Sages M.D.   On: 03/17/2024 08:15   IR ANGIO VERTEBRAL SEL VERTEBRAL BILAT MOD SED Result Date: 03/17/2024 CLINICAL DATA:  Symptoms of dizziness and vertigo. Suspicion of high-grade stenosis of the intracranial internal carotid arteries. EXAM: BILATERAL COMMON CAROTID AND INNOMINATE ANGIOGRAPHY COMPARISON:  CT angiogram of the head and neck of 03/12/2024. MEDICATIONS: Heparin  1000 units IV; no antibiotic was administered within 1 hour of the procedure. ANESTHESIA/SEDATION: Versed  0 mg IV; Fentanyl  25 mcg IV, and Benadryl  25 mg IV. Moderate Sedation Time:  47 minutes The patient was continuously monitored during the procedure by the interventional radiology nurse under my direct supervision. CONTRAST:  Omnipaque  300 approximately 65 mL. FLUOROSCOPY TIME:  Fluoroscopy Time: 17 minutes 42 seconds (398 mGy). COMPLICATIONS: None immediate. TECHNIQUE: Informed written consent was obtained from the patient after a thorough discussion of the procedural risks, benefits and alternatives. All questions were addressed. Maximal Sterile Barrier Technique was utilized including caps, mask, sterile gowns, sterile gloves, sterile drape, hand hygiene and skin antiseptic. A timeout was performed prior to the initiation of the procedure. The right groin was prepped and draped in the usual sterile fashion. Thereafter using modified Seldinger technique, transfemoral access into the right common femoral artery was obtained without difficulty. Over an 0.035 inch guidewire, a 5 French Pinnacle sheath was inserted. Through this, and also over an 0.035 inch guidewire, a 5 [place] JB 1 catheter was advanced to the aortic arch region and selectively positioned in the right common carotid artery, the right vertebral artery, the left common carotid artery and  the left vertebral artery. FINDINGS: The innominate arteriogram demonstrates mild stenosis of the proximal innominate artery. The proximal right subclavian artery and the right common carotid artery demonstrate wide patency. The right common carotid arteriogram demonstrates the right external carotid artery and its major branches to be widely patent. The right common carotid artery at the bulb demonstrates a small ulcerated plaque along its medial wall. Distal to this the right internal carotid artery opacifies normally to the cranial skull base. The petrous, the cavernous and the supraclinoid right ICA are widely patent. The right middle cerebral artery and the right anterior cerebral artery opacify into the capillary and venous phases. The proximal right vertebral artery demonstrates an approximately 20% stenosis just distal to its origin. The vessel opacifies to the right vertebrobasilar junction including a hypoplastic right posterior-inferior cerebellar artery. The opacified portion of the basilar artery, the posterior cerebral arteries, the superior cerebellar arteries and of the anterior inferior cerebellar arteries demonstrates patency into the capillary and venous phases. Unopacified blood is seen in the basilar artery from the contralateral vertebral artery. Suggestion of a right anterior inferior cerebellar artery/posteroinferior cerebellar artery complex is evident. The left subclavian arteriogram demonstrates mild stenosis proximally. The left vertebral artery demonstrates wide patency with moderate tortuosity just distal to its origin. Vessel opacifies to the cranial skull base. The left vertebrobasilar junction and the left posterior-inferior cerebellar artery demonstrate wide patency. The opacified portions of the basilar artery, the  posterior cerebral arteries, the superior cerebellar and of the anterior-inferior cerebellar arteries demonstrate patency. Unopacified blood is seen in the basilar  artery and distally from the contralateral vertebral artery. The left common carotid arteriogram demonstrates the left external carotid artery and its major branches to be widely patent. The left internal carotid artery at the bulb to the cranial skull base is widely patent. Wide patency is maintained of the petrous, the cavernous and the supraclinoid left ICA. The left middle and the left anterior cerebral artery opacify into the capillary and venous phases. Suggestion of a mild focal stenosis is seen of a proximal branch of the superior division of the left middle cerebral artery. IMPRESSION: Wide patency of the supraclinoid ICAs bilaterally intracranially. Approximately 20% stenosis of the proximal right vertebral artery due to eccentric atherosclerotic plaque. Suspicion of a mild stenosis of a proximal branch of the superior division of the left middle cerebral artery. PLAN: As per referring MD. Electronically Signed   By: Luellen Sages M.D.   On: 03/17/2024 08:15   MR BRAIN WO CONTRAST Result Date: 03/12/2024 CLINICAL DATA:  Provided history: Stroke, follow-up. EXAM: MRI HEAD WITHOUT CONTRAST TECHNIQUE: Multiplanar, multiecho pulse sequences of the brain and surrounding structures were obtained without intravenous contrast. COMPARISON:  Non-contrast head CT and CT angiogram head/neck performed earlier today 03/12/2024. Report from brain MRI 07/18/2015 (images unavailable). FINDINGS: Brain: Generalized cerebral atrophy. Chronic bilateral middle cerebral artery territory cortical/subcortical infarcts again demonstrated within the right frontal lobe/insula, right parietal lobe, left frontal lobe, left parietal lobe, left occipital lobe and left temporal lobe. Moderate multifocal T2 FLAIR hyperintense signal abnormality elsewhere within the cerebral white matter, and within the pons, nonspecific but compatible with chronic small vessel ischemic disease. There is no acute infarct. No evidence of an  intracranial mass. No extra-axial fluid collection. No midline shift. Vascular: Maintained flow voids within the proximal large arterial vessels. Skull and upper cervical spine: No focal worrisome marrow lesion. Incompletely assessed cervical spondylosis. Sinuses/Orbits: No mass or acute finding within the imaged orbits. No significant paranasal sinus disease. IMPRESSION: 1. No evidence of an acute intracranial abnormality. 2. Parenchymal atrophy, chronic small vessel ischemic disease and chronic infarcts, as described. Electronically Signed   By: Bascom Lily D.O.   On: 03/12/2024 16:54   CT ANGIO HEAD NECK W WO CM W PERF (CODE STROKE) Result Date: 03/12/2024 CLINICAL DATA:  68 year old male code stroke. History of bilateral MCA infarcts. EXAM: CT ANGIOGRAPHY HEAD AND NECK CT PERFUSION BRAIN TECHNIQUE: Multidetector CT imaging of the head and neck was performed using the standard protocol during bolus administration of intravenous contrast. Multiplanar CT image reconstructions and MIPs were obtained to evaluate the vascular anatomy. Carotid stenosis measurements (when applicable) are obtained utilizing NASCET criteria, using the distal internal carotid diameter as the denominator. Multiphase CT imaging of the brain was performed following IV bolus contrast injection. Subsequent parametric perfusion maps were calculated using RAPID software. RADIATION DOSE REDUCTION: This exam was performed according to the departmental dose-optimization program which includes automated exposure control, adjustment of the mA and/or kV according to patient size and/or use of iterative reconstruction technique. CONTRAST:  OMNIPAQUE  IOHEXOL  350 MG/ML SOLN COMPARISON:  Plain head CT 1227 hours today. FINDINGS: CT Brain Perfusion Findings: ASPECTS: 10 CBF (<30%) Volume: 4mL, however appears to be in the area of chronic encephalomalacia. Matched CBV abnormality there also. Perfusion (Tmax>6.0s) volume: 0mL Mismatch Volume: Not  applicable. Infarction Location:Right hemisphere artifacts suspected. CTA NECK Skeleton: Absent dentition. Mild for  age cervical spine degeneration. No acute osseous abnormality identified. Upper chest: Left chest pacemaker. Calcified coronary artery atherosclerosis and/or stent. Negative visible upper lungs, mediastinum. Other neck: Nonvascular neck soft tissue spaces are within normal limits. Aortic arch: Bovine arch configuration. Soft more so than calcified arch atherosclerosis. Right carotid system: Tortuous brachiocephalic artery with soft plaque, no stenosis. Mildly tortuous right CCA origin. Mild calcified plaque at the right carotid bifurcation, right ICA origin and bulb without stenosis. Left carotid system: Bovine left CCA origin with mild soft plaque, no stenosis. Additional soft plaque before the bifurcation without stenosis. Minimal calcified plaque at the left ICA origin and bulb without stenosis. Vertebral arteries: Tortuous proximal right subclavian artery with mild plaque and no stenosis. Calcified plaque at the right vertebral artery origin with mild to moderate stenosis (series 6, image 262). Right vertebral remains patent, is tortuous in the neck and mildly dominant to the skull base with no additional plaque or stenosis. Proximal left subclavian artery soft and calcified plaque. Calcified plaque is primarily at the left vertebral artery origin with at least moderate stenosis (series 10, image 146). Tortuous left V1 segment. The left vertebral artery remains patent and is tortuous, mildly non dominant to the skull base. CTA HEAD Posterior circulation: Patent distal vertebral arteries and vertebrobasilar junction. Right V4 calcified plaque with only mild stenosis. Normal left PICA origin. No significant left V4 stenosis. Right AICA appears patent and dominant. Patent basilar artery without stenosis. Patent SCA and PCA origins. Posterior communicating arteries are diminutive or absent. Bilateral  PCA branches are within normal limits. Anterior circulation: Both ICA siphons are patent. Left siphon moderate to severe supraclinoid calcified plaque and stenosis (series 10, image 137 and series 6, image 118 (appears severe). And contralateral similar bulky calcified plaque and severe right ICA siphon supraclinoid stenosis also on image 118. Despite that both carotid termini remain patent. Patent MCA and ACA origins. Anterior communicating artery and bilateral ACA branches are within normal limits. Left MCA M1 segment and bifurcation are patent without stenosis. Right MCA M1 segment and bifurcation are patent without stenosis. Bilateral MCA branches are within normal limits. Venous sinuses: Early contrast timing. Superior sagittal sinus appears patent. Anatomic variants: Mildly dominant right vertebral artery. Review of the MIP images confirms the above findings IMPRESSION: 1. CTA is negative for large vessel occlusion, but Positive for SEVERE BILATERAL supraclinoid ICA stenosis due to bulky calcified plaque. 2. CTP: Suspect artifact in the chronic Right MCA infarct. No oligemia detected. 3. Furthermore, up to Moderate stenosis of both vertebral artery origins, greater on the left. 4.  Aortic Atherosclerosis (ICD10-I70.0). #1, #2 were communicated to Dr. Bonnita Buttner at 1256 hours on 03/12/2024 by text page via the [facility] messaging system. Electronically Signed   By: Marlise Simpers M.D.   On: 03/12/2024 12:58   CT HEAD CODE STROKE WO CONTRAST Result Date: 03/12/2024 CLINICAL DATA:  Code stroke.  68 year old male. EXAM: CT HEAD WITHOUT CONTRAST TECHNIQUE: Contiguous axial images were obtained from the base of the skull through the vertex without intravenous contrast. RADIATION DOSE REDUCTION: This exam was performed according to the departmental dose-optimization program which includes automated exposure control, adjustment of the mA and/or kV according to patient size and/or use of iterative reconstruction technique.  COMPARISON:  Head CT 12/10/2022. FINDINGS: Brain: Stable cerebral volume from last year. Patchy chronic encephalomalacia in the posterior left MCA and middle/posterior right MCA territories is stable. Patchy additional asymmetric white matter hypodensity including at the left corona radiata and left operculum,  external capsule also appears stable. Stable gray-white matter differentiation throughout the brain. No cortically based acute infarct identified. No midline shift, mass effect, or evidence of intracranial mass lesion. No acute intracranial hemorrhage identified. No ventriculomegaly. Vascular: Calcified atherosclerosis at the skull base. No suspicious intracranial vascular hyperdensity. Skull: Stable and intact. Sinuses/Orbits: Visualized paranasal sinuses and mastoids are stable and well aerated. Other: No gaze deviation. No acute orbit or scalp soft tissue finding. ASPECTS [facility] Stroke Program Early CT Score) Total score (0-10 with 10 being normal): 10, chronic bilateral encephalomalacia. IMPRESSION: 1. Stable non contrast CT appearance of chronic bilateral MCA infarcts, asymmetric white matter disease. 2. ASPECTS 10. No acute cortically based infarct or acute intracranial hemorrhage identified. 3. These results were communicated to Dr. Arora at 12:36 pm on 03/12/2024 by text page via the [facility] messaging system. Electronically Signed   By: Marlise Simpers M.D.   On: 03/12/2024 12:36   CUP PACEART INCLINIC DEVICE CHECK Result Date: 03/12/2024 Normal in-clinic single chamber ICD check. Presenting Rhythm: VS. Routine testing was performed. Thresholds, sensing, and impedance demonstrate stable parameters and no programming changes needed. No treated arrhythmias. Estimated longevity 4 years. Pt enrolled in remote follow-up.Alline Areas, BSN, RN   Demaris Fillers, DO  [facility]  If 7PM-7AM, please contact night-coverage [email or web address] Password [facility] 04/11/2024, 7:34 AM   LOS: 2 days

## 2024-04-11 NOTE — Plan of Care (Signed)

## 2024-04-11 NOTE — Discharge Instructions (Signed)
 Information on my medicine - ELIQUIS  (apixaban )  Why was Eliquis  prescribed for you? Eliquis  was prescribed to treat blood clots that may have been found in the veins of your legs (deep vein thrombosis) or in your lungs (pulmonary embolism) and to reduce the risk of them occurring again.  What do You need to know about Eliquis  ? The starting dose is 10 mg (two 5 mg tablets) taken TWICE daily for the FIRST SEVEN (7) DAYS, then the dose is reduced to ONE 5 mg tablet taken TWICE daily.  Eliquis  may be taken with or without food.   Try to take the dose about the same time in the morning and in the evening. If you have difficulty swallowing the tablet whole please discuss with your pharmacist how to take the medication safely.  Take Eliquis  exactly as prescribed and DO NOT stop taking Eliquis  without talking to the doctor who prescribed the medication.  Stopping may increase your risk of developing a new blood clot.  Refill your prescription before you run out.  After discharge, you should have regular check-up appointments with your healthcare provider that is prescribing your Eliquis .    What do you do if you miss a dose? If a dose of ELIQUIS  is not taken at the scheduled time, take it as soon as possible on the same day and twice-daily administration should be resumed. The dose should not be doubled to make up for a missed dose.  Important Safety Information A possible side effect of Eliquis  is bleeding. You should call your healthcare provider right away if you experience any of the following: Bleeding from an injury or your nose that does not stop. Unusual colored urine (red or dark brown) or unusual colored stools (red or black). Unusual bruising for unknown reasons. A serious fall or if you hit your head (even if there is no bleeding).  Some medicines may interact with Eliquis  and might increase your risk of bleeding or clotting while on Eliquis . To help avoid this, consult your  healthcare provider or pharmacist prior to using any new prescription or non-prescription medications, including herbals, vitamins, non-steroidal anti-inflammatory drugs (NSAIDs) and supplements.  This website has more information on Eliquis  (apixaban ): [email or web address] Li

## 2024-04-11 NOTE — Consult Note (Signed)
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  02/04/2024 FINDINGS: Cardiac shadow is within normal limits. Defibrillator is again seen. Lungs are well aerated bilaterally. No focal infiltrate or effusion is seen. No bony abnormality is noted. IMPRESSION: No active cardiopulmonary disease. Electronically Signed   By: Violeta Grey M.D.   On: 04/09/2024 19:56    Assessment: *Rectal bleeding-patient reported *Acute pulmonary embolism *Chronic GERD  Plan: Discussed in depth with patient today as well as hospitalist Dr. Winferd Hatter.  Patient reports small red dot in his stool.  I am unsure if he is actually having rectal bleeding or not.  Hemoccult pending.  Continue to monitor H&H.  Fortunately this has been stable.  Out of precaution, would recommend switching to IV heparin  for his acute pulmonary embolus.  If he has evidence of significant GI bleeding, consider CTA abdomen pelvis to try  and localize bleed.  I discussed case with anesthesia who reports patient is not a candidate for colonoscopy at our facility given his acute pulmonary  embolus, ischemic cardiomyopathy.  If he does have evidence of GI bleeding, would recommend transfer to [facility].  Consider updating colonoscopy in the outpatient setting in 3-6 months once able to safely hold his anticoagulation.  Thank you for the consultation  Rolando Cliche. Mordechai April, D.O. Gastroenterology and Hepatology [facility]    LOS: 2 days     04/11/2024, 10:50 AM

## 2024-04-11 NOTE — Progress Notes (Signed)
 PHARMACY - ANTICOAGULATION CONSULT NOTE  Pharmacy Consult for heparin   Indication: pulmonary embolus  Allergies  Allergen Reactions   Pork-Derived Products Other (See Comments)    Unknown     Patient Measurements: Height: 5' 6 (167.6 cm) Weight: 67 kg (147 lb 11.3 oz) IBW/kg (Calculated) : 63.8 HEPARIN  DW (KG): 67.9  Vital Signs: Temp: 97.9 F (36.6 C) (06/14 0557) Temp Source: Oral (06/14 0557) BP: 96/66 (06/14 0557) Pulse Rate: 55 (06/14 0557)  Labs: Recent Labs    04/09/24 1923 04/09/24 2114 04/10/24 0041 04/10/24 0223  HGB 12.9*  --   --  13.2  HCT 39.7  --   --  41.1  PLT 261  --   --  258  CREATININE 1.25*  --   --  1.11  TROPONINIHS 35* 36* 37* 37*    Estimated Creatinine Clearance: 58.3 mL/min (by C-G formula based on SCr of 1.11 mg/dL).   Medical History: Past Medical History:  Diagnosis Date   AICD (automatic cardioverter/defibrillator) present    Anxiety    Arthritis    all over (09/17/2017)   Burn    CAD (coronary artery disease)    a. NSTEMI s/p BMS to 1st Diagonal and distal OM2 in 2007; b. STEMI 03/26/12 s/p BMS to RCA; c. NSTEMI 10/2012 : CTO of LCx (unable to open with PCI) and PL branch, mod dz of LAD and diagonal, and preserved LV systolic fxn, Med Rx;  d.  anterior STEMI in 01/2017 with DES to Proximal LAD   CAD in native artery 08/23/2020   Cardiomyopathy EF 35% on cath 06/30/14, new from jan 2015 07/20/2014   Chest pain    CHF (congestive heart failure) (HCC)    Chronic back pain    all over (09/17/2017)   DDD (degenerative disc disease), cervical    Depression    GERD (gastroesophageal reflux disease)    Headache    a few/wk (09/17/2017)   HTN (hypertension)    Hypercholesterolemia    Mental disorder    Myocardial infarction (HCC)    I've had 7 (09/17/2017)   Pulmonary edema    Respiratory failure (HCC)    Sciatic pain    Sleep apnea    Stroke [facility])    a. multiple dating back to 2002; *I''ve had 5; LUE/LLE weaker  since (09/17/2017)   Tibia fracture (l) leg   Tobacco abuse    Type 2 diabetes mellitus (HCC)    Unstable angina [facility])     Assessment: 68 year old male with new acute PE started on apixaban  loading dose on 6/13. Patient with hematochezia noted overnight. New orders to transition to IV heparin . Patient received apixaban  this morning, will start heparin  this evening.   Goal of Therapy:  Heparin  level 0.3-0.7 units/ml aPTT 66-102 seconds Monitor platelets by anticoagulation protocol: Yes   Plan:  Start heparin  infusion at 1000 units/hr Check anti-Xa level in 6-8 hours and daily while on heparin  Continue to monitor H&H and platelets  Audra Blend PharmD., BCPS Clinical Pharmacist 04/11/2024 10:45 AM

## 2024-04-11 NOTE — Progress Notes (Signed)
 Central tele called to state that pt's cardiac monitor shows an elongated QTC interval which was different from measurements this am. Monitor strips reviewed, lead noted to be off. Tele leads checked on pt and central tele states elongated QTC no longer noted. MD Tat notified of same, no new orders. Pt does have ICD.

## 2024-04-12 DIAGNOSIS — K625 Hemorrhage of anus and rectum: Secondary | ICD-10-CM | POA: Diagnosis not present

## 2024-04-12 DIAGNOSIS — E876 Hypokalemia: Secondary | ICD-10-CM | POA: Diagnosis not present

## 2024-04-12 DIAGNOSIS — F1090 Alcohol use, unspecified, uncomplicated: Secondary | ICD-10-CM | POA: Diagnosis not present

## 2024-04-12 DIAGNOSIS — I2699 Other pulmonary embolism without acute cor pulmonale: Secondary | ICD-10-CM | POA: Diagnosis not present

## 2024-04-12 LAB — CBC
HCT: 42.4 % (ref 39.0–52.0)
Hemoglobin: 13.2 g/dL (ref 13.0–17.0)
MCH: 25.5 pg — ABNORMAL LOW (ref 26.0–34.0)
MCHC: 31.1 g/dL (ref 30.0–36.0)
MCV: 82 fL (ref 80.0–100.0)
Platelets: 212 10*3/uL (ref 150–400)
RBC: 5.17 MIL/uL (ref 4.22–5.81)
RDW: 16.8 % — ABNORMAL HIGH (ref 11.5–15.5)
WBC: 5.4 10*3/uL (ref 4.0–10.5)
nRBC: 0 % (ref 0.0–0.2)

## 2024-04-12 LAB — BASIC METABOLIC PANEL WITH GFR
Anion gap: 10 (ref 5–15)
BUN: 15 mg/dL (ref 8–23)
CO2: 24 mmol/L (ref 22–32)
Calcium: 8.8 mg/dL — ABNORMAL LOW (ref 8.9–10.3)
Chloride: 104 mmol/L (ref 98–111)
Creatinine, Ser: 1.04 mg/dL (ref 0.61–1.24)
GFR, Estimated: >60 mL/min (ref >60)
Glucose, Bld: 99 mg/dL (ref 70–99)
Potassium: 3.8 mmol/L (ref 3.5–5.1)
Sodium: 138 mmol/L (ref 135–145)

## 2024-04-12 LAB — GLUCOSE, CAPILLARY
Glucose-Capillary: 109 mg/dL — ABNORMAL HIGH (ref 70–99)
Glucose-Capillary: 108 mg/dL — ABNORMAL HIGH (ref 70–99)
Glucose-Capillary: 156 mg/dL — ABNORMAL HIGH (ref 70–99)
Glucose-Capillary: 156 mg/dL — ABNORMAL HIGH (ref 70–99)

## 2024-04-12 LAB — MAGNESIUM: Magnesium: 2.2 mg/dL (ref 1.7–2.4)

## 2024-04-12 LAB — APTT
aPTT: 73 s — ABNORMAL HIGH (ref 24–36)
aPTT: 67 s — ABNORMAL HIGH (ref 24–36)

## 2024-04-12 LAB — HEPARIN LEVEL (UNFRACTIONATED): Heparin Unfractionated: >1.1 [IU]/mL — ABNORMAL HIGH (ref 0.30–0.70)

## 2024-04-12 MED ORDER — MELATONIN 3 MG PO TABS
6.0000 mg | ORAL_TABLET | Freq: Every evening | ORAL | Status: DC | PRN
  Administered 2024-04-12: 6 mg via ORAL
  Filled 2024-04-12: qty 2

## 2024-04-12 NOTE — Progress Notes (Addendum)
 PROGRESS NOTE  Oluwanifemi Susman Vanderploeg ZOX:[id number] DOB: 1956-09-20 DOA: 04/09/2024 PCP: Orlena Bitters, MD  Brief History:  Male with a history of HFrEF (25-30%), coronary disease with multiple stents,NS Vtach  St. Jude ICD in place, history of PE, HTN, HLD, Type 2 DM, Stage 3 CKD, prior LV thrombus, prior polysubstance abuse presenting with chest tightness/sob and lightheaded.   68 y.o. male with medical history significant of hypertension, hyperlipidemia, CAD s/p stent placement, T2DM, alcohol use disorder, chronic combined systolic and diastolic CHF who presents to the emergency department via EMS due to complaint of chest tightness, shortness of breath, increased leg swelling and some dizziness that has been ongoing for several days.  Patient states symptoms were similar to prior hospitalizations.  Chest pain was reproducible and left-sided, he endorsed increased weight gain (about 10 pounds).  Patient states that he had 1 beer PTA.  He was on Eliquis  at home, but was unsure if he was compliant with this.   His hospitalization was prolonged due to reported rectal bleed.  His apixaban  was switched to IV heparin  and he was monitored closely.    Assessment/Plan: Acute pulmonary embolus - 04/09/2024 CTA chest--acute PE segmental branches of LLL PA -Chart reviewed. Pt was last filled eliquis  in 4/25, none since  -not consider this Eliquis  failure, as pt was not taking Eliquis  at time of PE.  - Lower extremity duplex negative for DVT -switched to IV heparin  6/14   Rectal bleeding -6/14 reported by pt -unclear if he truly had rectal bleeding by pt's description -appreciate GI consult -discussed with Dr. Mordechai April -check hemoccult -monitor H/H--remains stable -switch apixaban  to IV heparin  for now -04/12/24--no reports of rectal bleed in last 24 hours -plan to switch back to apixaban  6/16 if no bleeding and Hgb stable   Hypokalemia - Repleting   Chronic HFpEF - clinically  compensated - 04/10/2024 HK, normal RVF, moderate MR, mild TR -ICD in place and most recent device interrogation in 10/2023 showed normal device function  -Continue current GDMT with Entresto  24-26 mg twice daily, Spironolactone  25 mg daily, Jardiance  10 mg daily and Digoxin  0.125 mg daily. Beta-blocker therapy previously discontinued given borderline low output. BP does not allow for further titration of GDMT  - last visit with AHF clinic--Entresto  and spironolactone  were stopped secondary to her soft blood pressures.  Jardiance  and digoxin  were continued. - Resume torsemide  20 mg daily   History of LV thrombus -previously been on Coumadin  for LV thrombus and had been switched to Eliquis  and was continued on this.     Elevated troponin - Secondary to demand ischemia - Troponin trend is flat - Troponins 35>> 36>> 37>> 37   Coronary artery disease - history of multiple interventions as discussed above and most recent cardiac catheterization in 2023 showed patent stents and 50% PLV and 50% PDA stenosis with medical management recommended.  -not on ASA given the need for anticoagulation     NSVT - continued on Amiodarone  200mg  daily -s/p ICD   Alcohol abuse - Alcohol level 68 - No signs of withdrawal   Diabetes mellitus type 2 with hyperglycemia Hemoglobin A1c 6.8 on 01/22/2024 Continue Jardiance  Cont SSI as needed   Mixed hyperlipidemia -continue zetia    Hypothyroidism -continue synthroid         Family Communication:  no Family at bedside  Consultants:  GI  Code Status:  FULL   DVT Prophylaxis:  IV Heparin    Procedures: As  Listed in Progress Note Above  Antibiotics: None       Subjective: Patient denies fevers, chills, headache, chest pain, dyspnea, nausea, vomiting, diarrhea, abdominal pain, dysuria, hematuria, hematochezia, and melena.   Objective: Vitals:   04/11/24 2033 04/12/24 0448 04/12/24 0500 04/12/24 1400  BP: 100/69 (!) 90/59  94/68   Pulse: 94 (!) 56  61  Resp: 17 16    Temp: 99 F (37.2 C) 98.1 F (36.7 C)  98.4 F (36.9 C)  TempSrc: Oral Oral  Oral  SpO2: 100% 96%    Weight:   67.5 kg   Height:        Intake/Output Summary (Last 24 hours) at 04/12/2024 1534 Last data filed at 04/12/2024 1300 Gross per 24 hour  Intake 91.3 ml  Output 2200 ml  Net -2108.7 ml   Weight change: 0.5 kg Exam:  General:  Pt is alert, follows commands appropriately, not in acute distress HEENT: No icterus, No thrush, No neck mass, [place]/AT Cardiovascular: RRR, S1/S2, no rubs, no gallops Respiratory: bibasilar crackles. No wheeze Abdomen: Soft/+BS, non tender, non distended, no guarding Extremities: No edema, No lymphangitis, No petechiae, No rashes, no synovitis   Data Reviewed: I have personally reviewed following labs and imaging studies Basic Metabolic Panel: Recent Labs  Lab 04/06/24 1210 04/09/24 1923 04/10/24 0223 04/12/24 0237  NA 143 140 141 138  K 4.2 3.0* 4.0 3.8  CL 110 103 104 104  CO2 27 22 27 24   GLUCOSE 43* 191* 161* 99  BUN 8 12 12 15   CREATININE 1.65* 1.25* 1.11 1.04  CALCIUM  8.7* 8.8* 8.7* 8.8*  MG  --  2.2 2.2 2.2  PHOS  --   --  3.8  --    Liver Function Tests: Recent Labs  Lab 04/10/24 0223  AST 29  ALT 19  ALKPHOS 66  BILITOT 0.5  PROT 6.6  ALBUMIN 3.4*   No results for input(s): LIPASE, AMYLASE in the last 168 hours. No results for input(s): AMMONIA in the last 168 hours. Coagulation Profile: No results for input(s): INR, PROTIME in the last 168 hours. CBC: Recent Labs  Lab 04/09/24 1923 04/10/24 0223 04/12/24 0237  WBC 6.7 5.8 5.4  HGB 12.9* 13.2 13.2  HCT 39.7 41.1 42.4  MCV 80.7 81.2 82.0  PLT 261 258 212   Cardiac Enzymes: No results for input(s): CKTOTAL, CKMB, CKMBINDEX, TROPONINI in the last 168 hours. BNP: Invalid input(s): POCBNP CBG: Recent Labs  Lab 04/11/24 1149 04/11/24 1647 04/11/24 2036 04/12/24 0741 04/12/24 1117  GLUCAP 96  111* 159* 109* 108*   HbA1C: No results for input(s): HGBA1C in the last 72 hours. Urine analysis:    Component Value Date/Time   COLORURINE YELLOW 03/12/2024 1837   APPEARANCEUR CLEAR 03/12/2024 1837   LABSPEC 1.023 03/12/2024 1837   PHURINE 5.0 03/12/2024 1837   GLUCOSEU >=500 (A) 03/12/2024 1837   HGBUR NEGATIVE 03/12/2024 1837   BILIRUBINUR NEGATIVE 03/12/2024 1837   KETONESUR NEGATIVE 03/12/2024 1837   PROTEINUR NEGATIVE 03/12/2024 1837   NITRITE NEGATIVE 03/12/2024 1837   LEUKOCYTESUR NEGATIVE 03/12/2024 1837   Sepsis Labs: @LABRCNTIP (procalcitonin:4,lacticidven:4) )No results found for this or any previous visit (from the past 240 hours).   Scheduled Meds:  amiodarone   200 mg Oral Daily   digoxin   0.125 mg Oral Daily   empagliflozin   10 mg Oral Daily   ezetimibe   10 mg Oral Daily   insulin  aspart  0-15 Units Subcutaneous TID WC   levothyroxine   100 mcg  Oral Q0600   pantoprazole   40 mg Oral Daily   Continuous Infusions:  heparin  1,000 Units/hr (04/11/24 2003)    Procedures/Studies: ECHOCARDIOGRAM COMPLETE Result Date: 04/10/2024    ECHOCARDIOGRAM REPORT   Patient Name:   MARQUETT BERTOLI Date of Exam: 04/10/2024 Medical Rec #:  [id number]           Height:       66.0 in Accession #:    [id number]          Weight:       149.7 lb Date of Birth:  1956-01-30           BSA:          1.768 m Patient Age:    67 years            BP:           100/63 mmHg Patient Gender: M                   HR:           62 bpm. Exam Location:  [facility] Procedure: 2D Echo, 3D Echo, Cardiac Doppler, Color Doppler and Intracardiac            Opacification Agent (Both Spectral and Color Flow Doppler were            utilized during procedure). Indications:    Pulmonary Embolus I26.09  History:        Patient has prior history of Echocardiogram examinations, most                 recent 01/23/2024. CHF and Cardiomyopathy, CAD and Previous                 Myocardial Infarction, Defibrillator, Stroke  and TIA; Risk                 Factors:Hypertension, Diabetes, Dyslipidemia, Current Smoker and                 Sleep Apnea.  Sonographer:    Denese Finn RCS Referring Phys: [id number] OLADAPO ADEFESO IMPRESSIONS  1. Left ventricular ejection fraction, by estimation, is 25 to 30%. The left ventricle has normal function. The left ventricle demonstrates global hypokinesis. The left ventricular internal cavity size was severely dilated. Left ventricular diastolic parameters are indeterminate. Elevated left atrial pressure.  2. Right ventricular systolic function is normal. The right ventricular size is normal.  3. Left atrial size was severely dilated.  4. MR vena contraca is 0.6 cm. The mitral valve is abnormal. Moderate mitral valve regurgitation. No evidence of mitral stenosis.  5. The tricuspid valve is abnormal.  6. The aortic valve is tricuspid. Aortic valve regurgitation is not visualized. No aortic stenosis is present.  7. The inferior vena cava is normal in size with greater than 50% respiratory variability, suggesting right atrial pressure of 3 mmHg. FINDINGS  Left Ventricle: Left ventricular ejection fraction, by estimation, is 25 to 30%. The left ventricle has normal function. The left ventricle demonstrates global hypokinesis. Definity  contrast agent was given IV to delineate the left ventricular endocardial borders. The left ventricular internal cavity size was severely dilated. There is no left ventricular hypertrophy. Left ventricular diastolic parameters are indeterminate. Elevated left atrial pressure. Right Ventricle: The right ventricular size is normal. Right vetricular wall thickness was not well visualized. Right ventricular systolic function is normal. Left Atrium: Left atrial size was severely dilated. Right Atrium: Right atrial size was normal in  size. Pericardium: There is no evidence of pericardial effusion. Mitral Valve: MR vena contraca is 0.6 cm. The mitral valve is abnormal. Moderate  mitral valve regurgitation. No evidence of mitral valve stenosis. Tricuspid Valve: The tricuspid valve is abnormal. Tricuspid valve regurgitation is mild . No evidence of tricuspid stenosis. Aortic Valve: The aortic valve is tricuspid. Aortic valve regurgitation is not visualized. No aortic stenosis is present. Aortic valve mean gradient measures 2.6 mmHg. Aortic valve peak gradient measures 5.2 mmHg. Aortic valve area, by VTI measures 2.21 cm. Pulmonic Valve: The pulmonic valve was not well visualized. Pulmonic valve regurgitation is not visualized. No evidence of pulmonic stenosis. Aorta: The aortic root is normal in size and structure and the ascending aorta was not well visualized. Venous: The inferior vena cava is normal in size with greater than 50% respiratory variability, suggesting right atrial pressure of 3 mmHg. IAS/Shunts: No atrial level shunt detected by color flow Doppler.  LEFT VENTRICLE PLAX 2D LVIDd:         7.30 cm      Diastology LVIDs:         6.60 cm      LV e' medial:    4.90 cm/s LV PW:         0.90 cm      LV E/e' medial:  15.9 LV IVS:        0.80 cm      LV e' lateral:   3.59 cm/s LVOT diam:     2.10 cm      LV E/e' lateral: 21.7 LV SV:         49 LV SV Index:   28 LVOT Area:     3.46 cm                              3D Volume EF: LV Volumes (MOD)            3D EF:        36 % LV vol d, MOD A2C: 241.0 ml LV EDV:       282 ml LV vol d, MOD A4C: 299.0 ml LV ESV:       182 ml LV vol s, MOD A2C: 169.0 ml LV SV:        100 ml LV vol s, MOD A4C: 203.0 ml LV SV MOD A2C:     72.0 ml LV SV MOD A4C:     299.0 ml LV SV MOD BP:      83.5 ml RIGHT VENTRICLE RV S prime:     10.20 cm/s TAPSE (M-mode): 2.1 cm LEFT ATRIUM              Index        RIGHT ATRIUM           Index LA diam:        4.40 cm  2.49 cm/m   RA Area:     16.50 cm LA Vol (A2C):   115.0 ml 65.04 ml/m  RA Volume:   41.90 ml  23.70 ml/m LA Vol (A4C):   84.3 ml  47.68 ml/m LA Biplane Vol: 102.0 ml 57.69 ml/m  AORTIC VALVE AV Area  (Vmax):    2.34 cm AV Area (Vmean):   2.52 cm AV Area (VTI):     2.21 cm AV Vmax:           113.49 cm/s AV Vmean:  74.459 cm/s AV VTI:            0.223 m AV Peak Grad:      5.2 mmHg AV Mean Grad:      2.6 mmHg LVOT Vmax:         76.70 cm/s LVOT Vmean:        54.100 cm/s LVOT VTI:          0.142 m LVOT/AV VTI ratio: 0.64  AORTA Ao Root diam: 3.90 cm MITRAL VALVE MV Area (PHT): 4.57 cm       SHUNTS MV Decel Time: 166 msec       Systemic VTI:  0.14 m MR Peak grad:    80.3 mmHg    Systemic Diam: 2.10 cm MR Mean grad:    48.0 mmHg MR Vmax:         448.00 cm/s MR Vmean:        320.0 cm/s MR PISA:         2.26 cm MR PISA Eff ROA: 14 mm MR PISA Radius:  0.60 cm MV E velocity: 77.90 cm/s MV A velocity: 96.30 cm/s MV E/A ratio:  0.81 Armida Lander MD Electronically signed by Armida Lander MD Signature Date/Time: 04/10/2024/3:29:16 PM    Final    US  Venous Img Lower Bilateral (DVT) Result Date: 04/10/2024 CLINICAL DATA:  Right lower extremity edema EXAM: RIGHT LOWER EXTREMITY VENOUS DOPPLER ULTRASOUND TECHNIQUE: Gray-scale sonography with compression, as well as color and duplex ultrasound, were performed to evaluate the deep venous system(s) from the level of the common femoral vein through the popliteal and proximal calf veins. COMPARISON:  None Available. FINDINGS: VENOUS Normal compressibility of the common femoral, superficial femoral, and popliteal veins, as well as the visualized calf veins. Visualized portions of profunda femoral vein and great saphenous vein unremarkable. No filling defects to suggest DVT on grayscale or color Doppler imaging. Doppler waveforms show normal direction of venous flow, normal respiratory plasticity and response to augmentation. Limited views of the contralateral common femoral vein are unremarkable. OTHER None. Limitations: none IMPRESSION: Negative. Electronically Signed   By: Fernando Hoyer M.D.   On: 04/10/2024 09:20   CT Angio Chest PE W and/or Wo  Contrast Result Date: 04/09/2024 CLINICAL DATA:  Pulmonary embolism suspected. Positive D-dimer. Chest pain for 2-3 days. EXAM: CT ANGIOGRAPHY CHEST WITH CONTRAST TECHNIQUE: Multidetector CT imaging of the chest was performed using the standard protocol during bolus administration of intravenous contrast. Multiplanar CT image reconstructions and MIPs were obtained to evaluate the vascular anatomy. RADIATION DOSE REDUCTION: This exam was performed according to the departmental dose-optimization program which includes automated exposure control, adjustment of the mA and/or kV according to patient size and/or use of iterative reconstruction technique. CONTRAST:  75mL OMNIPAQUE  IOHEXOL  350 MG/ML SOLN COMPARISON:  Chest radiograph 04/09/2024 and CTA chest 01/22/2024 FINDINGS: Cardiovascular: Nonocclusive lobar pulmonary embolism in the left lower lobe pulmonary artery with associated wispy stranding. This may represent chronic thromboembolism. Acute appearing pulmonary embolism in a segmental branch of the left lower lobe pulmonary artery. The left ventricle is dilated. No evidence of right heart strain as there is compression of the right ventricle secondary to left ventricular enlargement. No pericardial effusion. Normal caliber thoracic aorta. Coronary artery and aortic atherosclerotic calcification. Left chest wall ICD. Mediastinum/Nodes: Trachea and esophagus are unremarkable. No thoracic adenopathy. Lungs/Pleura: Emphysema. Lower lobe atelectasis/scarring. The lungs are otherwise clear. No pleural effusion or pneumothorax. Upper Abdomen: No acute abnormality. Musculoskeletal: No acute fracture. Review of the MIP  images confirms the above findings. IMPRESSION: 1. Acute pulmonary embolism in a segmental branch of the left lower lobe pulmonary artery. No evidence of right heart strain. Aortic Atherosclerosis (ICD10-I70.0) and Emphysema (ICD10-J43.9). Critical Value/emergent results were called by telephone at the  time of interpretation on 04/09/2024 at 9:52 pm to provider Shyrl Doyne , who verbally acknowledged these results. Electronically Signed   By: Rozell Cornet M.D.   On: 04/09/2024 21:54   DG Chest 2 View Result Date: 04/09/2024 CLINICAL DATA:  Chest pain for 2 days, initial encounter EXAM: CHEST - 2 VIEW COMPARISON:  02/04/2024 FINDINGS: Cardiac shadow is within normal limits. Defibrillator is again seen. Lungs are well aerated bilaterally. No focal infiltrate or effusion is seen. No bony abnormality is noted. IMPRESSION: No active cardiopulmonary disease. Electronically Signed   By: Violeta Grey M.D.   On: 04/09/2024 19:56   VAS US  GROIN PSEUDOANEURYSM Result Date: 03/17/2024  ARTERIAL PSEUDOANEURYSM  Patient Name:  ABDIRIZAK RICHISON  Date of Exam:   03/17/2024 Medical Rec #: [id number]            Accession #:    [id number] Date of Birth: Jun 02, 1956            Patient Gender: M Patient Age:   57 years Exam Location:  [facility] Procedure:      VAS US  GROIN PSEUDOANEURYSM Referring Phys: Thomos Flies --------------------------------------------------------------------------------  Exam: Right groin Indications: Patient complains of groin pain. History: Right groin pain after cerebral arteriogram 5/19. Performing Technologist: Franky Ivanoff Sturdivant-Jones RDMS, RVT  Examination Guidelines: A complete evaluation includes B-mode imaging, spectral Doppler, color Doppler, and power Doppler as needed of all accessible portions of each vessel. Bilateral testing is considered an integral part of a complete examination. Limited examinations for reoccurring indications may be performed as noted. +------------+----------+---------+------+----------+ Right DuplexPSV (cm/s)Waveform PlaqueComment(s) +------------+----------+---------+------+----------+ CFA             86    triphasic                 +------------+----------+---------+------+----------+ PFA             54    triphasic                  +------------+----------+---------+------+----------+ Prox SFA        64    triphasic                 +------------+----------+---------+------+----------+ Right Vein comments:Patent right common femoral vein.  Summary: No evidence of pseudoaneurysm, AVF or DVT  Diagnosing physician: Genny Kid MD Electronically signed by Genny Kid MD on 03/17/2024 at 7:08:12 PM.   --------------------------------------------------------------------------------    Final    IR ANGIO INTRA EXTRACRAN SEL COM CAROTID INNOMINATE BILAT MOD SED Result Date: 03/17/2024 CLINICAL DATA:  Symptoms of dizziness and vertigo. Suspicion of high-grade stenosis of the intracranial internal carotid arteries. EXAM: BILATERAL COMMON CAROTID AND INNOMINATE ANGIOGRAPHY COMPARISON:  CT angiogram of the head and neck of 03/12/2024. MEDICATIONS: Heparin  1000 units IV; no antibiotic was administered within 1 hour of the procedure. ANESTHESIA/SEDATION: Versed  0 mg IV; Fentanyl  25 mcg IV, and Benadryl  25 mg IV. Moderate Sedation Time:  47 minutes The patient was continuously monitored during the procedure by the interventional radiology nurse under my direct supervision. CONTRAST:  Omnipaque  300 approximately 65 mL. FLUOROSCOPY TIME:  Fluoroscopy Time: 17 minutes 42 seconds (398 mGy). COMPLICATIONS: None immediate. TECHNIQUE: Informed written consent was obtained from the patient after a thorough discussion of the  procedural risks, benefits and alternatives. All questions were addressed. Maximal Sterile Barrier Technique was utilized including caps, mask, sterile gowns, sterile gloves, sterile drape, hand hygiene and skin antiseptic. A timeout was performed prior to the initiation of the procedure. The right groin was prepped and draped in the usual sterile fashion. Thereafter using modified Seldinger technique, transfemoral access into the right common femoral artery was obtained without difficulty. Over an 0.035 inch guidewire, a 5 French  Pinnacle sheath was inserted. Through this, and also over an 0.035 inch guidewire, a 5 [place] JB 1 catheter was advanced to the aortic arch region and selectively positioned in the right common carotid artery, the right vertebral artery, the left common carotid artery and the left vertebral artery. FINDINGS: The innominate arteriogram demonstrates mild stenosis of the proximal innominate artery. The proximal right subclavian artery and the right common carotid artery demonstrate wide patency. The right common carotid arteriogram demonstrates the right external carotid artery and its major branches to be widely patent. The right common carotid artery at the bulb demonstrates a small ulcerated plaque along its medial wall. Distal to this the right internal carotid artery opacifies normally to the cranial skull base. The petrous, the cavernous and the supraclinoid right ICA are widely patent. The right middle cerebral artery and the right anterior cerebral artery opacify into the capillary and venous phases. The proximal right vertebral artery demonstrates an approximately 20% stenosis just distal to its origin. The vessel opacifies to the right vertebrobasilar junction including a hypoplastic right posterior-inferior cerebellar artery. The opacified portion of the basilar artery, the posterior cerebral arteries, the superior cerebellar arteries and of the anterior inferior cerebellar arteries demonstrates patency into the capillary and venous phases. Unopacified blood is seen in the basilar artery from the contralateral vertebral artery. Suggestion of a right anterior inferior cerebellar artery/posteroinferior cerebellar artery complex is evident. The left subclavian arteriogram demonstrates mild stenosis proximally. The left vertebral artery demonstrates wide patency with moderate tortuosity just distal to its origin. Vessel opacifies to the cranial skull base. The left vertebrobasilar junction and the left  posterior-inferior cerebellar artery demonstrate wide patency. The opacified portions of the basilar artery, the posterior cerebral arteries, the superior cerebellar and of the anterior-inferior cerebellar arteries demonstrate patency. Unopacified blood is seen in the basilar artery and distally from the contralateral vertebral artery. The left common carotid arteriogram demonstrates the left external carotid artery and its major branches to be widely patent. The left internal carotid artery at the bulb to the cranial skull base is widely patent. Wide patency is maintained of the petrous, the cavernous and the supraclinoid left ICA. The left middle and the left anterior cerebral artery opacify into the capillary and venous phases. Suggestion of a mild focal stenosis is seen of a proximal branch of the superior division of the left middle cerebral artery. IMPRESSION: Wide patency of the supraclinoid ICAs bilaterally intracranially. Approximately 20% stenosis of the proximal right vertebral artery due to eccentric atherosclerotic plaque. Suspicion of a mild stenosis of a proximal branch of the superior division of the left middle cerebral artery. PLAN: As per referring MD. Electronically Signed   By: Luellen Sages M.D.   On: 03/17/2024 08:15   IR ANGIO VERTEBRAL SEL VERTEBRAL BILAT MOD SED Result Date: 03/17/2024 CLINICAL DATA:  Symptoms of dizziness and vertigo. Suspicion of high-grade stenosis of the intracranial internal carotid arteries. EXAM: BILATERAL COMMON CAROTID AND INNOMINATE ANGIOGRAPHY COMPARISON:  CT angiogram of the head and neck of 03/12/2024. MEDICATIONS: Heparin   1000 units IV; no antibiotic was administered within 1 hour of the procedure. ANESTHESIA/SEDATION: Versed  0 mg IV; Fentanyl  25 mcg IV, and Benadryl  25 mg IV. Moderate Sedation Time:  47 minutes The patient was continuously monitored during the procedure by the interventional radiology nurse under my direct supervision. CONTRAST:   Omnipaque  300 approximately 65 mL. FLUOROSCOPY TIME:  Fluoroscopy Time: 17 minutes 42 seconds (398 mGy). COMPLICATIONS: None immediate. TECHNIQUE: Informed written consent was obtained from the patient after a thorough discussion of the procedural risks, benefits and alternatives. All questions were addressed. Maximal Sterile Barrier Technique was utilized including caps, mask, sterile gowns, sterile gloves, sterile drape, hand hygiene and skin antiseptic. A timeout was performed prior to the initiation of the procedure. The right groin was prepped and draped in the usual sterile fashion. Thereafter using modified Seldinger technique, transfemoral access into the right common femoral artery was obtained without difficulty. Over an 0.035 inch guidewire, a 5 French Pinnacle sheath was inserted. Through this, and also over an 0.035 inch guidewire, a 5 [place] JB 1 catheter was advanced to the aortic arch region and selectively positioned in the right common carotid artery, the right vertebral artery, the left common carotid artery and the left vertebral artery. FINDINGS: The innominate arteriogram demonstrates mild stenosis of the proximal innominate artery. The proximal right subclavian artery and the right common carotid artery demonstrate wide patency. The right common carotid arteriogram demonstrates the right external carotid artery and its major branches to be widely patent. The right common carotid artery at the bulb demonstrates a small ulcerated plaque along its medial wall. Distal to this the right internal carotid artery opacifies normally to the cranial skull base. The petrous, the cavernous and the supraclinoid right ICA are widely patent. The right middle cerebral artery and the right anterior cerebral artery opacify into the capillary and venous phases. The proximal right vertebral artery demonstrates an approximately 20% stenosis just distal to its origin. The vessel opacifies to the right  vertebrobasilar junction including a hypoplastic right posterior-inferior cerebellar artery. The opacified portion of the basilar artery, the posterior cerebral arteries, the superior cerebellar arteries and of the anterior inferior cerebellar arteries demonstrates patency into the capillary and venous phases. Unopacified blood is seen in the basilar artery from the contralateral vertebral artery. Suggestion of a right anterior inferior cerebellar artery/posteroinferior cerebellar artery complex is evident. The left subclavian arteriogram demonstrates mild stenosis proximally. The left vertebral artery demonstrates wide patency with moderate tortuosity just distal to its origin. Vessel opacifies to the cranial skull base. The left vertebrobasilar junction and the left posterior-inferior cerebellar artery demonstrate wide patency. The opacified portions of the basilar artery, the posterior cerebral arteries, the superior cerebellar and of the anterior-inferior cerebellar arteries demonstrate patency. Unopacified blood is seen in the basilar artery and distally from the contralateral vertebral artery. The left common carotid arteriogram demonstrates the left external carotid artery and its major branches to be widely patent. The left internal carotid artery at the bulb to the cranial skull base is widely patent. Wide patency is maintained of the petrous, the cavernous and the supraclinoid left ICA. The left middle and the left anterior cerebral artery opacify into the capillary and venous phases. Suggestion of a mild focal stenosis is seen of a proximal branch of the superior division of the left middle cerebral artery. IMPRESSION: Wide patency of the supraclinoid ICAs bilaterally intracranially. Approximately 20% stenosis of the proximal right vertebral artery due to eccentric atherosclerotic plaque. Suspicion of a  mild stenosis of a proximal branch of the superior division of the left middle cerebral artery. PLAN:  As per referring MD. Electronically Signed   By: Luellen Sages M.D.   On: 03/17/2024 08:15    Demaris Fillers, DO  [facility]  If 7PM-7AM, please contact night-coverage [email or web address] Password [facility] 04/12/2024, 3:34 PM   LOS: 3 days

## 2024-04-12 NOTE — Progress Notes (Signed)
 PHARMACY - ANTICOAGULATION CONSULT NOTE  Pharmacy Consult for heparin   Indication: pulmonary embolus  Allergies  Allergen Reactions   Pork-Derived Products Other (See Comments)    Unknown     Patient Measurements: Height: 5' 6 (167.6 cm) Weight: 67.5 kg (148 lb 13 oz) IBW/kg (Calculated) : 63.8 HEPARIN  DW (KG): 67.9  Vital Signs: Temp: 98.1 F (36.7 C) (06/15 0448) Temp Source: Oral (06/15 0448) BP: 90/59 (06/15 0448) Pulse Rate: 56 (06/15 0448)  Labs: Recent Labs    04/09/24 1923 04/09/24 2114 04/10/24 0041 04/10/24 0223 04/12/24 0237 04/12/24 1141  HGB 12.9*  --   --  13.2 13.2  --   HCT 39.7  --   --  41.1 42.4  --   PLT 261  --   --  258 212  --   APTT  --   --   --   --  73* 67*  HEPARINUNFRC  --   --   --   --  >1.10*  --   CREATININE 1.25*  --   --  1.11 1.04  --   TROPONINIHS 35* 36* 37* 37*  --   --     Estimated Creatinine Clearance: 62.2 mL/min (by C-G formula based on SCr of 1.04 mg/dL).   Medical History: Past Medical History:  Diagnosis Date   AICD (automatic cardioverter/defibrillator) present    Anxiety    Arthritis    all over (09/17/2017)   Burn    CAD (coronary artery disease)    a. NSTEMI s/p BMS to 1st Diagonal and distal OM2 in 2007; b. STEMI 03/26/12 s/p BMS to RCA; c. NSTEMI 10/2012 : CTO of LCx (unable to open with PCI) and PL branch, mod dz of LAD and diagonal, and preserved LV systolic fxn, Med Rx;  d.  anterior STEMI in 01/2017 with DES to Proximal LAD   CAD in native artery 08/23/2020   Cardiomyopathy EF 35% on cath 06/30/14, new from jan 2015 07/20/2014   Chest pain    CHF (congestive heart failure) (HCC)    Chronic back pain    all over (09/17/2017)   DDD (degenerative disc disease), cervical    Depression    GERD (gastroesophageal reflux disease)    Headache    a few/wk (09/17/2017)   HTN (hypertension)    Hypercholesterolemia    Mental disorder    Myocardial infarction (HCC)    I've had 7 (09/17/2017)    Pulmonary edema    Respiratory failure (HCC)    Sciatic pain    Sleep apnea    Stroke [facility])    a. multiple dating back to 2002; *I''ve had 5; LUE/LLE weaker since (09/17/2017)   Tibia fracture (l) leg   Tobacco abuse    Type 2 diabetes mellitus (HCC)    Unstable angina [facility])     Assessment: 68 year old male with new acute PE started on apixaban  loading dose on 6/13. Patient with hematochezia noted overnight. New orders to transition to IV heparin . Patient received apixaban  this morning, will start heparin  this evening.   aPTT 67- therapeutic on low end of range CBC WNL  Goal of Therapy:  Heparin  level 0.3-0.7 units/ml aPTT 66-102 seconds Monitor platelets by anticoagulation protocol: Yes   Plan:  Cont heparin  1000 units/hr Heparin  level and aPTT daily Continue to monitor H&H and platelets.  Cliffton Dama, PharmD Clinical Pharmacist 04/12/2024 12:22 PM

## 2024-04-12 NOTE — Plan of Care (Signed)

## 2024-04-12 NOTE — Progress Notes (Signed)
 PHARMACY - ANTICOAGULATION CONSULT NOTE  Pharmacy Consult for heparin   Indication: pulmonary embolus  Allergies  Allergen Reactions   Pork-Derived Products Other (See Comments)    Unknown     Patient Measurements: Height: 5' 6 (167.6 cm) Weight: 67 kg (147 lb 11.3 oz) IBW/kg (Calculated) : 63.8 HEPARIN  DW (KG): 67.9  Vital Signs: Temp: 99 F (37.2 C) (06/14 2033) Temp Source: Oral (06/14 2033) BP: 100/69 (06/14 2033) Pulse Rate: 94 (06/14 2033)  Labs: Recent Labs    04/09/24 1923 04/09/24 2114 04/10/24 0041 04/10/24 0223 04/12/24 0237  HGB 12.9*  --   --  13.2 13.2  HCT 39.7  --   --  41.1 42.4  PLT 261  --   --  258 212  APTT  --   --   --   --  73*  HEPARINUNFRC  --   --   --   --  >1.10*  CREATININE 1.25*  --   --  1.11  --   TROPONINIHS 35* 36* 37* 37*  --     Estimated Creatinine Clearance: 58.3 mL/min (by C-G formula based on SCr of 1.11 mg/dL).   Medical History: Past Medical History:  Diagnosis Date   AICD (automatic cardioverter/defibrillator) present    Anxiety    Arthritis    all over (09/17/2017)   Burn    CAD (coronary artery disease)    a. NSTEMI s/p BMS to 1st Diagonal and distal OM2 in 2007; b. STEMI 03/26/12 s/p BMS to RCA; c. NSTEMI 10/2012 : CTO of LCx (unable to open with PCI) and PL branch, mod dz of LAD and diagonal, and preserved LV systolic fxn, Med Rx;  d.  anterior STEMI in 01/2017 with DES to Proximal LAD   CAD in native artery 08/23/2020   Cardiomyopathy EF 35% on cath 06/30/14, new from jan 2015 07/20/2014   Chest pain    CHF (congestive heart failure) (HCC)    Chronic back pain    all over (09/17/2017)   DDD (degenerative disc disease), cervical    Depression    GERD (gastroesophageal reflux disease)    Headache    a few/wk (09/17/2017)   HTN (hypertension)    Hypercholesterolemia    Mental disorder    Myocardial infarction (HCC)    I've had 7 (09/17/2017)   Pulmonary edema    Respiratory failure (HCC)     Sciatic pain    Sleep apnea    Stroke [facility])    a. multiple dating back to 2002; *I''ve had 5; LUE/LLE weaker since (09/17/2017)   Tibia fracture (l) leg   Tobacco abuse    Type 2 diabetes mellitus (HCC)    Unstable angina [facility])     Assessment: 68 year old male with new acute PE started on apixaban  loading dose on 6/13. Patient with hematochezia noted overnight. New orders to transition to IV heparin . Patient received apixaban  this morning, will start heparin  this evening.   6/15 AM update:  aPTT therapeutic   Goal of Therapy:  Heparin  level 0.3-0.7 units/ml aPTT 66-102 seconds Monitor platelets by anticoagulation protocol: Yes   Plan:  Cont heparin  1000 units/hr Heparin  level and aPTT in 8 hours  Silvestre Drum, PharmD, BCPS Clinical Pharmacist Phone: [phone]

## 2024-04-13 DIAGNOSIS — I9589 Other hypotension: Secondary | ICD-10-CM

## 2024-04-13 DIAGNOSIS — Z95810 Presence of automatic (implantable) cardiac defibrillator: Secondary | ICD-10-CM

## 2024-04-13 DIAGNOSIS — I959 Hypotension, unspecified: Secondary | ICD-10-CM

## 2024-04-13 DIAGNOSIS — I2699 Other pulmonary embolism without acute cor pulmonale: Secondary | ICD-10-CM | POA: Diagnosis not present

## 2024-04-13 DIAGNOSIS — I340 Nonrheumatic mitral (valve) insufficiency: Secondary | ICD-10-CM

## 2024-04-13 DIAGNOSIS — F1090 Alcohol use, unspecified, uncomplicated: Secondary | ICD-10-CM | POA: Diagnosis not present

## 2024-04-13 DIAGNOSIS — E512 Wernicke's encephalopathy: Secondary | ICD-10-CM

## 2024-04-13 DIAGNOSIS — K625 Hemorrhage of anus and rectum: Secondary | ICD-10-CM | POA: Diagnosis not present

## 2024-04-13 DIAGNOSIS — I255 Ischemic cardiomyopathy: Secondary | ICD-10-CM | POA: Diagnosis not present

## 2024-04-13 LAB — GLUCOSE, CAPILLARY
Glucose-Capillary: 108 mg/dL — ABNORMAL HIGH (ref 70–99)
Glucose-Capillary: 142 mg/dL — ABNORMAL HIGH (ref 70–99)
Glucose-Capillary: 112 mg/dL — ABNORMAL HIGH (ref 70–99)
Glucose-Capillary: 366 mg/dL — ABNORMAL HIGH (ref 70–99)

## 2024-04-13 LAB — CBC
HCT: 41.8 % (ref 39.0–52.0)
Hemoglobin: 12.9 g/dL — ABNORMAL LOW (ref 13.0–17.0)
MCH: 25 pg — ABNORMAL LOW (ref 26.0–34.0)
MCHC: 30.9 g/dL (ref 30.0–36.0)
MCV: 81.2 fL (ref 80.0–100.0)
Platelets: 227 10*3/uL (ref 150–400)
RBC: 5.15 MIL/uL (ref 4.22–5.81)
RDW: 16.4 % — ABNORMAL HIGH (ref 11.5–15.5)
WBC: 5.9 10*3/uL (ref 4.0–10.5)
nRBC: 0 % (ref 0.0–0.2)

## 2024-04-13 LAB — HEPARIN LEVEL (UNFRACTIONATED): Heparin Unfractionated: 0.85 [IU]/mL — ABNORMAL HIGH (ref 0.30–0.70)

## 2024-04-13 LAB — APTT: aPTT: 74 s — ABNORMAL HIGH (ref 24–36)

## 2024-04-13 MED ORDER — MIDODRINE HCL 5 MG PO TABS
5.0000 mg | ORAL_TABLET | Freq: Three times a day (TID) | ORAL | Status: DC
  Administered 2024-04-13 – 2024-04-14 (×4): 5 mg via ORAL
  Filled 2024-04-13 (×4): qty 1

## 2024-04-13 MED ORDER — THIAMINE HCL 100 MG/ML IJ SOLN
500.0000 mg | Freq: Three times a day (TID) | INTRAVENOUS | Status: DC
  Administered 2024-04-13 – 2024-04-14 (×3): 500 mg via INTRAVENOUS
  Filled 2024-04-13 (×6): qty 5

## 2024-04-13 MED ORDER — INSULIN ASPART 100 UNIT/ML IJ SOLN
0.0000 [IU] | Freq: Three times a day (TID) | INTRAMUSCULAR | Status: DC
  Administered 2024-04-14: 1 [IU] via SUBCUTANEOUS

## 2024-04-13 MED ORDER — APIXABAN 5 MG PO TABS: 5.0000 mg | ORAL_TABLET | Freq: Two times a day (BID) | ORAL | Status: DC

## 2024-04-13 MED ORDER — INSULIN ASPART 100 UNIT/ML IJ SOLN
5.0000 [IU] | Freq: Once | INTRAMUSCULAR | Status: AC
  Administered 2024-04-13: 5 [IU] via SUBCUTANEOUS

## 2024-04-13 MED ORDER — APIXABAN 5 MG PO TABS
10.0000 mg | ORAL_TABLET | Freq: Two times a day (BID) | ORAL | Status: DC
  Administered 2024-04-13 – 2024-04-14 (×3): 10 mg via ORAL
  Filled 2024-04-13 (×3): qty 2

## 2024-04-13 NOTE — Consult Note (Signed)
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 lower lobe pulmonary artery with associated wispy stranding. This may represent chronic thromboembolism. Acute appearing pulmonary embolism in a segmental branch of the left lower lobe pulmonary artery. The left ventricle is dilated. No evidence of right heart strain as there is compression of the right ventricle secondary to left ventricular enlargement. No pericardial effusion. Normal caliber thoracic aorta. Coronary artery and aortic atherosclerotic calcification. Left chest wall ICD. Mediastinum/Nodes: Trachea and esophagus are unremarkable. No thoracic adenopathy. Lungs/Pleura: Emphysema. Lower lobe atelectasis/scarring. The lungs are otherwise clear. No pleural effusion or pneumothorax. Upper Abdomen: No acute abnormality. Musculoskeletal: No acute fracture. Review of the MIP images confirms the above findings. IMPRESSION: 1. Acute pulmonary embolism in a segmental branch of the left lower lobe pulmonary artery. No evidence of  right heart strain. Aortic Atherosclerosis (ICD10-I70.0) and Emphysema (ICD10-J43.9). Critical Value/emergent results were called by telephone at the time of interpretation on 04/09/2024 at 9:52 pm to provider Shyrl Doyne , who verbally acknowledged these results. Electronically Signed   By: Rozell Cornet M.D.   On: 04/09/2024 21:54   DG Chest 2 View Result Date: 04/09/2024 CLINICAL DATA:  Chest pain for 2 days, initial encounter EXAM: CHEST - 2 VIEW COMPARISON:  02/04/2024 FINDINGS: Cardiac shadow is within normal limits. Defibrillator is again seen. Lungs are well aerated bilaterally. No focal infiltrate or effusion is seen. No bony abnormality is noted. IMPRESSION: No active cardiopulmonary disease. Electronically Signed   By: Violeta Grey M.D.   On: 04/09/2024 19:56     Assessment and Plan:  Hypotension: New onset this admission in the setting of acute pulm embolism.  Patient also has been symptomatic with dizziness for a few days prior to admission.  Currently not on any GDMT except her Jardiance , will discontinue.  Agree with starting midodrine 5 mg TID.  Strongly encouraged alcohol cessation.  Acute pulmonary embolism: New onset this admission.  Also had prior history of pulmonary embolism in 2023/24 and history of LV thrombus previously.  He reported that Eliquis  was stopped by Dr Rodolfo Clan for ICD procedure and he also might have skipped Eliquis  dose.  Unclear if he developed any new Wernicke's encephalopathy due to confabulating stories this admission versus known cognitive impairment.  He also reports having shortness of breath only with deep inhalation exhalation, clear denies any orthopnea.  No leg swelling.  Ischemic cardiomyopathy s/p [employer] ICD: LVEF 20 to 25% since 2018.  Not on GDMT due to hypotension.  Will hold Jardiance .  Moderate to severe MR: TEER was deferred due to improvement in MR on subsequent echocardiogram.  CAD s/p multiple revascularizations: Has pleuritic chest  pain but no angina.  Not on aspirin  due to Eliquis  use.  Not on statin due to rhabdomyolysis on statin therapy.  Currently on Repatha  and Zetia .  History of LV thrombus: Previously on Coumadin  that was discontinued on one of the [facility] admissions.  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  Alcohol use: EtOH level 68 on admission.  He probably has some cognitive impairment but unclear if this is from chronic alcohol use.   For questions or updates, please contact [facility] Please consult [email or web address] for contact info under    Maylani Embree Priya Sheyli Horwitz, MD [place]  [facility]  3:42 PM

## 2024-04-13 NOTE — Progress Notes (Signed)
 Physical Therapy Treatment Patient Details Name: Daniel Li MRN: [id number] DOB: Nov 25, 1955 Today's Date: 04/13/2024   History of Present Illness Daniel Li is a 68 y.o. male with medical history significant of hypertension, hyperlipidemia, CAD s/p stent placement, T2DM, alcohol use disorder, chronic combined systolic and diastolic CHF who presents to the emergency department via EMS due to complaint of chest tightness, shortness of breath, increased leg swelling and some dizziness that has been ongoing for several days.  Patient states symptoms were similar to prior hospitalizations.  Chest pain was reproducible and left-sided, he endorsed increased weight gain (about 10 pounds).  Patient states that he had 1 beer PTA.  He was on Eliquis  at home, but was unsure if he was compliant with this.    PT Comments  Patient demonstrates increased tolerance/distance for gait training using quad-cane and RW.  Patient slightly unsteady and fatigues easily using quad-cane and patient requested to use RW for walking in hallway without loss of balance. Patient tolerated sitting up at bedside after therapy. Patient will benefit from continued skilled physical therapy in hospital and recommended venue below to increase strength, balance, endurance for safe ADLs and gait.     If plan is discharge home, recommend the following: A little help with walking and/or transfers;A little help with bathing/dressing/bathroom;Help with stairs or ramp for entrance;Assistance with cooking/housework   Can travel by private vehicle        Equipment Recommendations  None recommended by PT    Recommendations for Other Services       Precautions / Restrictions Precautions Precautions: Fall Recall of Precautions/Restrictions: Intact Restrictions Weight Bearing Restrictions Per Provider Order: No     Mobility  Bed Mobility Overal bed mobility: Modified Independent                   Transfers Overall transfer level: Needs assistance Equipment used: Quad cane, Rolling walker (2 wheels) Transfers: Sit to/from Stand, Bed to chair/wheelchair/BSC Sit to Stand: Supervision, Contact guard assist   Step pivot transfers: Contact guard assist, Supervision       General transfer comment: increased BLE strength for completing sit to stands    Ambulation/Gait Ambulation/Gait assistance: Supervision, Contact guard assist Gait Distance (Feet): 80 Feet Assistive device: Rolling walker (2 wheels), Quad cane Gait Pattern/deviations: Decreased step length - right, Decreased step length - left, Decreased stride length, Antalgic Gait velocity: decreased     General Gait Details: able to ambulate out of room using quad-cane with mostly step-to pattern, requested to use RW for rest of gait training due to fatiguing easily using quad-cane and no loss of balance using RW or Quad-cane   Stairs             Wheelchair Mobility     Tilt Bed    Modified Rankin (Stroke Patients Only)       Balance Overall balance assessment: Needs assistance Sitting-balance support: Feet supported, No upper extremity supported Sitting balance-Leahy Scale: Good Sitting balance - Comments: seated at EOB   Standing balance support: Reliant on assistive device for balance, During functional activity, Bilateral upper extremity supported, Single extremity supported Standing balance-Leahy Scale: Fair Standing balance comment: fair using quad-cane, fair/good using RW                            Communication Communication Communication: No apparent difficulties  Cognition Arousal: Alert Behavior During Therapy: WFL for tasks assessed/performed   PT - Cognitive  impairments: No apparent impairments                         Following commands: Intact      Cueing Cueing Techniques: Verbal cues  Exercises General Exercises - Lower Extremity Long Arc Quad: Seated,  AROM, Strengthening, Both, 10 reps Hip Flexion/Marching: Seated, AROM, Strengthening, Both, 10 reps Toe Raises: Seated, AROM, Strengthening, Both, 10 reps Heel Raises: Seated, AROM, Strengthening, Both, 10 reps    General Comments        Pertinent Vitals/Pain Pain Assessment Pain Assessment: Faces Faces Pain Scale: Hurts a little bit Pain Location: medial distal right leg above knee, right shoulder Pain Descriptors / Indicators: Sore, Discomfort Pain Intervention(s): Limited activity within patient's tolerance, Monitored during session, Repositioned    Home Living                          Prior Function            PT Goals (current goals can now be found in the care plan section) Acute Rehab PT Goals Patient Stated Goal: return home with ALF staff to assist PT Goal Formulation: With patient Time For Goal Achievement: 04/17/24 Potential to Achieve Goals: Good Progress towards PT goals: Progressing toward goals    Frequency    Min 3X/week      PT Plan      Co-evaluation              AM-PAC PT 6 Clicks Mobility   Outcome Measure  Help needed turning from your back to your side while in a flat bed without using bedrails?: None Help needed moving from lying on your back to sitting on the side of a flat bed without using bedrails?: None Help needed moving to and from a bed to a chair (including a wheelchair)?: None Help needed standing up from a chair using your arms (e.g., wheelchair or bedside chair)?: A Little Help needed to walk in hospital room?: A Little Help needed climbing 3-5 steps with a railing? : A Little 6 Click Score: 21    End of Session   Activity Tolerance: Patient tolerated treatment well;Patient limited by fatigue Patient left: in bed;with call bell/phone within reach Nurse Communication: Mobility status PT Visit Diagnosis: Unsteadiness on feet (R26.81);Other abnormalities of gait and mobility (R26.89);Muscle weakness  (generalized) (M62.81)     Time: 1610-9604 PT Time Calculation (min) (ACUTE ONLY): 14 min  Charges:    $Gait Training: 8-22 mins $Therapeutic Exercise: 8-22 mins PT General Charges $$ ACUTE PT VISIT: 1 Visit                     2:30 PM, 04/13/24 Walton Guppy, MPT Physical Therapist with [facility] [phone] office [phone] mobile phone

## 2024-04-13 NOTE — Progress Notes (Signed)
 PHARMACY - ANTICOAGULATION CONSULT NOTE  Pharmacy Consult for heparin   >> Eliquis  Indication: pulmonary embolus  Allergies  Allergen Reactions   Pork-Derived Products Other (See Comments)    Unknown     Patient Measurements: Height: 5' 6 (167.6 cm) Weight: 66.4 kg (146 lb 6.2 oz) IBW/kg (Calculated) : 63.8 HEPARIN  DW (KG): 67.9  Vital Signs: Temp: 98.6 F (37 C) (06/16 1141) Temp Source: Oral (06/16 1141) BP: 89/63 (06/16 1141) Pulse Rate: 57 (06/16 1141)  Labs: Recent Labs    04/12/24 0237 04/12/24 1141 04/13/24 0435  HGB 13.2  --  12.9*  HCT 42.4  --  41.8  PLT 212  --  227  APTT 73* 67* 74*  HEPARINUNFRC >1.10*  --  0.85*  CREATININE 1.04  --   --     Estimated Creatinine Clearance: 62.2 mL/min (by C-G formula based on SCr of 1.04 mg/dL).   Medical History: Past Medical History:  Diagnosis Date   AICD (automatic cardioverter/defibrillator) present    Anxiety    Arthritis    all over (09/17/2017)   Burn    CAD (coronary artery disease)    a. NSTEMI s/p BMS to 1st Diagonal and distal OM2 in 2007; b. STEMI 03/26/12 s/p BMS to RCA; c. NSTEMI 10/2012 : CTO of LCx (unable to open with PCI) and PL branch, mod dz of LAD and diagonal, and preserved LV systolic fxn, Med Rx;  d.  anterior STEMI in 01/2017 with DES to Proximal LAD   CAD in native artery 08/23/2020   Cardiomyopathy EF 35% on cath 06/30/14, new from jan 2015 07/20/2014   Chest pain    CHF (congestive heart failure) (HCC)    Chronic back pain    all over (09/17/2017)   DDD (degenerative disc disease), cervical    Depression    GERD (gastroesophageal reflux disease)    Headache    a few/wk (09/17/2017)   HTN (hypertension)    Hypercholesterolemia    Mental disorder    Myocardial infarction (HCC)    I've had 7 (09/17/2017)   Pulmonary edema    Respiratory failure (HCC)    Sciatic pain    Sleep apnea    Stroke [facility])    a. multiple dating back to 2002; *I''ve had 5; LUE/LLE weaker since  (09/17/2017)   Tibia fracture (l) leg   Tobacco abuse    Type 2 diabetes mellitus (HCC)    Unstable angina [facility])     Assessment: 68 year old male with new acute PE started on apixaban  loading dose on 6/13. Patient with hematochezia noted overnight. New orders to transition to IV heparin . Patient last received apixaban  in the morning on 6/14, started heparin  evening of 6/14.      Goal of Therapy:  Heparin  level 0.3-0.7 units/ml aPTT 66-102 seconds Monitor platelets by anticoagulation protocol: Yes   Plan:  Stop heparin  infusion. Start apixaban  10 mg twice daily x 7 days followed by apixaban  5 mg twice daily. Continue to monitor H&H and platelets.  Cliffton Dama, PharmD Clinical Pharmacist 04/13/2024 12:14 PM

## 2024-04-13 NOTE — Plan of Care (Signed)
  Problem: Education: Goal: Knowledge of General Education information will improve Description: Including pain rating scale, medication(s)/side effects and non-pharmacologic comfort measures Outcome: Adequate for Discharge   Problem: Clinical Measurements: Goal: Ability to maintain clinical measurements within normal limits will improve Outcome: Adequate for Discharge Goal: Diagnostic test results will improve Outcome: Adequate for Discharge

## 2024-04-13 NOTE — Progress Notes (Signed)
 PROGRESS NOTE  Daniel Li JYN:[id number] DOB: Jan 12, 1956 DOA: 04/09/2024 PCP: Orlena Bitters, MD  Brief History:  Male with a history of HFrEF (25-30%), coronary disease with multiple stents,NS Vtach  St. Jude ICD in place, history of PE, HTN, HLD, Type 2 DM, Stage 3 CKD, prior LV thrombus, prior polysubstance abuse presenting with chest tightness/sob and lightheaded.   68 y.o. male with medical history significant of hypertension, hyperlipidemia, CAD s/p stent placement, T2DM, alcohol use disorder, chronic combined systolic and diastolic CHF who presents to the emergency department via EMS due to complaint of chest tightness, shortness of breath, increased leg swelling and some dizziness that has been ongoing for several days.  Patient states symptoms were similar to prior hospitalizations.  Chest pain was reproducible and left-sided, he endorsed increased weight gain (about 10 pounds).  Patient states that he had 1 beer PTA.  He was on Eliquis  at home, but was unsure if he was compliant with this.   His hospitalization was prolonged due to reported rectal bleed.  His apixaban  was switched to IV heparin  and he was monitored closely.   Assessment/Plan: Acute pulmonary embolus - 04/09/2024 CTA chest--acute PE segmental branches of LLL PA -Chart reviewed. Pt was last filled eliquis  in 4/25, none since  -not consider this Eliquis  failure, as pt was not taking Eliquis  at time of PE.  - Lower extremity duplex negative for DVT -switched to IV heparin  6/14 -6/16>>transition back to apixaban    Rectal bleeding -6/14 reported by pt -unclear if he truly had rectal bleeding by pt's description -appreciate GI consult -discussed with Dr. Mordechai April -check hemoccult -monitor H/H--remains stable -switch apixaban  to IV heparin  for now -04/12/24--no reports of rectal bleed in last 24 hours -plan to switch back to apixaban  6/16 if no bleeding and Hgb stable  Hypotension -Echo = perserved  RV function -hgb stable -appreciate cardiology consult>>d/c Jardiance  -add midodrine -am cortisol   Hypokalemia - Repleting   Chronic HFpEF - clinically compensated - 04/10/2024 HK, normal RVF, moderate MR, mild TR -ICD in place and most recent device interrogation in 10/2023 showed normal device function  -Continue current GDMT with Entresto  24-26 mg twice daily, Spironolactone  25 mg daily, Jardiance  10 mg daily and Digoxin  0.125 mg daily. Beta-blocker therapy previously discontinued given borderline low output. BP does not allow for further titration of GDMT  - last visit with A[facility] clinic--Entresto  and spironolactone  were stopped secondary to her soft blood pressures.  Jardiance  and digoxin  were continued. - holding torsemide  20 mg daily due to soft BPs   History of LV thrombus -previously been on Coumadin  for LV thrombus and had been switched to Eliquis  and was continued on this.     Elevated troponin - Secondary to demand ischemia - Troponin trend is flat - Troponins 35>> 36>> 37>> 37   Coronary artery disease - history of multiple interventions as discussed above and most recent cardiac catheterization in 2023 showed patent stents and 50% PLV and 50% PDA stenosis with medical management recommended.  -not on ASA given the need for anticoagulation     NSVT - continued on Amiodarone  200mg  daily -s/p ICD   Alcohol abuse - Alcohol level 68 - No signs of withdrawal - start thiamine   Diabetes mellitus type 2 with hyperglycemia Hemoglobin A1c 6.8 on 01/22/2024 Continue Jardiance  Cont SSI as needed   Mixed hyperlipidemia -continue zetia    Hypothyroidism -continue synthroid   Family Communication:  no Family at bedside   Consultants:  GI   Code Status:  FULL    DVT Prophylaxis:  IV Heparin >>apixaban      Procedures: As Listed in Progress Note Above   Antibiotics: None       Subjective:  Patient denies fevers, chills, headache, chest pain,  dyspnea, nausea, vomiting, diarrhea, abdominal pain, dysuria, hematuria, hematochezia, and melena.  Objective: Vitals:   04/12/24 2133 04/13/24 0448 04/13/24 0500 04/13/24 1141  BP: 90/62 93/64  (!) 89/63  Pulse:  (!) 57  (!) 57  Resp: 19 17    Temp:  98.4 F (36.9 C)  98.6 F (37 C)  TempSrc:  Oral  Oral  SpO2:  98%  97%  Weight:   66.4 kg   Height:        Intake/Output Summary (Last 24 hours) at 04/13/2024 1655 Last data filed at 04/13/2024 1300 Gross per 24 hour  Intake 892.31 ml  Output 750 ml  Net 142.31 ml   Weight change: -1.1 kg Exam:  General:  Pt is alert, follows commands appropriately, not in acute distress HEENT: No icterus, No thrush, No neck mass, [place]/AT Cardiovascular: RRR, S1/S2, no rubs, no gallops Respiratory: CTA bilaterally, no wheezing, no crackles, no rhonchi Abdomen: Soft/+BS, non tender, non distended, no guarding Extremities: No edema, No lymphangitis, No petechiae, No rashes, no synovitis   Data Reviewed: I have personally reviewed following labs and imaging studies Basic Metabolic Panel: Recent Labs  Lab 04/09/24 1923 04/10/24 0223 04/12/24 0237  NA 140 141 138  K 3.0* 4.0 3.8  CL 103 104 104  CO2 22 27 24   GLUCOSE 191* 161* 99  BUN 12 12 15   CREATININE 1.25* 1.11 1.04  CALCIUM  8.8* 8.7* 8.8*  MG 2.2 2.2 2.2  PHOS  --  3.8  --    Liver Function Tests: Recent Labs  Lab 04/10/24 0223  AST 29  ALT 19  ALKPHOS 66  BILITOT 0.5  PROT 6.6  ALBUMIN 3.4*   No results for input(s): LIPASE, AMYLASE in the last 168 hours. No results for input(s): AMMONIA in the last 168 hours. Coagulation Profile: No results for input(s): INR, PROTIME in the last 168 hours. CBC: Recent Labs  Lab 04/09/24 1923 04/10/24 0223 04/12/24 0237 04/13/24 0435  WBC 6.7 5.8 5.4 5.9  HGB 12.9* 13.2 13.2 12.9*  HCT 39.7 41.1 42.4 41.8  MCV 80.7 81.2 82.0 81.2  PLT 261 258 212 227   Cardiac Enzymes: No results for input(s): CKTOTAL, CKMB,  CKMBINDEX, TROPONINI in the last 168 hours. BNP: Invalid input(s): POCBNP CBG: Recent Labs  Lab 04/12/24 1604 04/12/24 2110 04/13/24 0741 04/13/24 1115 04/13/24 1622  GLUCAP 156* 156* 108* 142* 112*   HbA1C: No results for input(s): HGBA1C in the last 72 hours. Urine analysis:    Component Value Date/Time   COLORURINE YELLOW 03/12/2024 1837   APPEARANCEUR CLEAR 03/12/2024 1837   LABSPEC 1.023 03/12/2024 1837   PHURINE 5.0 03/12/2024 1837   GLUCOSEU >=500 (A) 03/12/2024 1837   HGBUR NEGATIVE 03/12/2024 1837   BILIRUBINUR NEGATIVE 03/12/2024 1837   KETONESUR NEGATIVE 03/12/2024 1837   PROTEINUR NEGATIVE 03/12/2024 1837   NITRITE NEGATIVE 03/12/2024 1837   LEUKOCYTESUR NEGATIVE 03/12/2024 1837   Sepsis Labs: @LABRCNTIP (procalcitonin:4,lacticidven:4) )No results found for this or any previous visit (from the past 240 hours).   Scheduled Meds:  amiodarone   200 mg Oral Daily   apixaban   10 mg Oral BID   Followed by   [  START ON 04/20/2024] apixaban   5 mg Oral BID   digoxin   0.125 mg Oral Daily   ezetimibe   10 mg Oral Daily   insulin  aspart  0-9 Units Subcutaneous TID WC   levothyroxine   100 mcg Oral Q0600   midodrine  5 mg Oral TID WC   pantoprazole   40 mg Oral Daily   Continuous Infusions:  thiamine (VITAMIN B1) injection      Procedures/Studies: ECHOCARDIOGRAM COMPLETE Result Date: 04/10/2024    ECHOCARDIOGRAM REPORT   Patient Name:   Daniel Li Date of Exam: 04/10/2024 Medical Rec #:  [id number]           Height:       66.0 in Accession #:    [id number]          Weight:       149.7 lb Date of Birth:  Nov 16, 1955           BSA:          1.768 m Patient Age:    67 years            BP:           100/63 mmHg Patient Gender: M                   HR:           62 bpm. Exam Location:  [facility] Procedure: 2D Echo, 3D Echo, Cardiac Doppler, Color Doppler and Intracardiac            Opacification Agent (Both Spectral and Color Flow Doppler were            utilized  during procedure). Indications:    Pulmonary Embolus I26.09  History:        Patient has prior history of Echocardiogram examinations, most                 recent 01/23/2024. CHF and Cardiomyopathy, CAD and Previous                 Myocardial Infarction, Defibrillator, Stroke and TIA; Risk                 Factors:Hypertension, Diabetes, Dyslipidemia, Current Smoker and                 Sleep Apnea.  Sonographer:    Denese Finn RCS Referring Phys: [id number] OLADAPO ADEFESO IMPRESSIONS  1. Left ventricular ejection fraction, by estimation, is 25 to 30%. The left ventricle has normal function. The left ventricle demonstrates global hypokinesis. The left ventricular internal cavity size was severely dilated. Left ventricular diastolic parameters are indeterminate. Elevated left atrial pressure.  2. Right ventricular systolic function is normal. The right ventricular size is normal.  3. Left atrial size was severely dilated.  4. MR vena contraca is 0.6 cm. The mitral valve is abnormal. Moderate mitral valve regurgitation. No evidence of mitral stenosis.  5. The tricuspid valve is abnormal.  6. The aortic valve is tricuspid. Aortic valve regurgitation is not visualized. No aortic stenosis is present.  7. The inferior vena cava is normal in size with greater than 50% respiratory variability, suggesting right atrial pressure of 3 mmHg. FINDINGS  Left Ventricle: Left ventricular ejection fraction, by estimation, is 25 to 30%. The left ventricle has normal function. The left ventricle demonstrates global hypokinesis. Definity  contrast agent was given IV to delineate the left ventricular endocardial borders. The left ventricular internal cavity size was severely dilated. There is no  left ventricular hypertrophy. Left ventricular diastolic parameters are indeterminate. Elevated left atrial pressure. Right Ventricle: The right ventricular size is normal. Right vetricular wall thickness was not well visualized. Right ventricular  systolic function is normal. Left Atrium: Left atrial size was severely dilated. Right Atrium: Right atrial size was normal in size. Pericardium: There is no evidence of pericardial effusion. Mitral Valve: MR vena contraca is 0.6 cm. The mitral valve is abnormal. Moderate mitral valve regurgitation. No evidence of mitral valve stenosis. Tricuspid Valve: The tricuspid valve is abnormal. Tricuspid valve regurgitation is mild . No evidence of tricuspid stenosis. Aortic Valve: The aortic valve is tricuspid. Aortic valve regurgitation is not visualized. No aortic stenosis is present. Aortic valve mean gradient measures 2.6 mmHg. Aortic valve peak gradient measures 5.2 mmHg. Aortic valve area, by VTI measures 2.21 cm. Pulmonic Valve: The pulmonic valve was not well visualized. Pulmonic valve regurgitation is not visualized. No evidence of pulmonic stenosis. Aorta: The aortic root is normal in size and structure and the ascending aorta was not well visualized. Venous: The inferior vena cava is normal in size with greater than 50% respiratory variability, suggesting right atrial pressure of 3 mmHg. IAS/Shunts: No atrial level shunt detected by color flow Doppler.  LEFT VENTRICLE PLAX 2D LVIDd:         7.30 cm      Diastology LVIDs:         6.60 cm      LV e' medial:    4.90 cm/s LV PW:         0.90 cm      LV E/e' medial:  15.9 LV IVS:        0.80 cm      LV e' lateral:   3.59 cm/s LVOT diam:     2.10 cm      LV E/e' lateral: 21.7 LV SV:         49 LV SV Index:   28 LVOT Area:     3.46 cm                              3D Volume EF: LV Volumes (MOD)            3D EF:        36 % LV vol d, MOD A2C: 241.0 ml LV EDV:       282 ml LV vol d, MOD A4C: 299.0 ml LV ESV:       182 ml LV vol s, MOD A2C: 169.0 ml LV SV:        100 ml LV vol s, MOD A4C: 203.0 ml LV SV MOD A2C:     72.0 ml LV SV MOD A4C:     299.0 ml LV SV MOD BP:      83.5 ml RIGHT VENTRICLE RV S prime:     10.20 cm/s TAPSE (M-mode): 2.1 cm LEFT ATRIUM               Index        RIGHT ATRIUM           Index LA diam:        4.40 cm  2.49 cm/m   RA Area:     16.50 cm LA Vol (A2C):   115.0 ml 65.04 ml/m  RA Volume:   41.90 ml  23.70 ml/m LA Vol (A4C):   84.3 ml  47.68 ml/m LA Biplane Vol:  102.0 ml 57.69 ml/m  AORTIC VALVE AV Area (Vmax):    2.34 cm AV Area (Vmean):   2.52 cm AV Area (VTI):     2.21 cm AV Vmax:           113.49 cm/s AV Vmean:          74.459 cm/s AV VTI:            0.223 m AV Peak Grad:      5.2 mmHg AV Mean Grad:      2.6 mmHg LVOT Vmax:         76.70 cm/s LVOT Vmean:        54.100 cm/s LVOT VTI:          0.142 m LVOT/AV VTI ratio: 0.64  AORTA Ao Root diam: 3.90 cm MITRAL VALVE MV Area (PHT): 4.57 cm       SHUNTS MV Decel Time: 166 msec       Systemic VTI:  0.14 m MR Peak grad:    80.3 mmHg    Systemic Diam: 2.10 cm MR Mean grad:    48.0 mmHg MR Vmax:         448.00 cm/s MR Vmean:        320.0 cm/s MR PISA:         2.26 cm MR PISA Eff ROA: 14 mm MR PISA Radius:  0.60 cm MV E velocity: 77.90 cm/s MV A velocity: 96.30 cm/s MV E/A ratio:  0.81 Armida Lander MD Electronically signed by Armida Lander MD Signature Date/Time: 04/10/2024/3:29:16 PM    Final    US  Venous Img Lower Bilateral (DVT) Result Date: 04/10/2024 CLINICAL DATA:  Right lower extremity edema EXAM: RIGHT LOWER EXTREMITY VENOUS DOPPLER ULTRASOUND TECHNIQUE: Gray-scale sonography with compression, as well as color and duplex ultrasound, were performed to evaluate the deep venous system(s) from the level of the common femoral vein through the popliteal and proximal calf veins. COMPARISON:  None Available. FINDINGS: VENOUS Normal compressibility of the common femoral, superficial femoral, and popliteal veins, as well as the visualized calf veins. Visualized portions of profunda femoral vein and great saphenous vein unremarkable. No filling defects to suggest DVT on grayscale or color Doppler imaging. Doppler waveforms show normal direction of venous flow, normal respiratory plasticity and  response to augmentation. Limited views of the contralateral common femoral vein are unremarkable. OTHER None. Limitations: none IMPRESSION: Negative. Electronically Signed   By: Fernando Hoyer M.D.   On: 04/10/2024 09:20   CT Angio Chest PE W and/or Wo Contrast Result Date: 04/09/2024 CLINICAL DATA:  Pulmonary embolism suspected. Positive D-dimer. Chest pain for 2-3 days. EXAM: CT ANGIOGRAPHY CHEST WITH CONTRAST TECHNIQUE: Multidetector CT imaging of the chest was performed using the standard protocol during bolus administration of intravenous contrast. Multiplanar CT image reconstructions and MIPs were obtained to evaluate the vascular anatomy. RADIATION DOSE REDUCTION: This exam was performed according to the departmental dose-optimization program which includes automated exposure control, adjustment of the mA and/or kV according to patient size and/or use of iterative reconstruction technique. CONTRAST:  75mL OMNIPAQUE  IOHEXOL  350 MG/ML SOLN COMPARISON:  Chest radiograph 04/09/2024 and CTA chest 01/22/2024 FINDINGS: Cardiovascular: Nonocclusive lobar pulmonary embolism in the left lower lobe pulmonary artery with associated wispy stranding. This may represent chronic thromboembolism. Acute appearing pulmonary embolism in a segmental branch of the left lower lobe pulmonary artery. The left ventricle is dilated. No evidence of right heart strain as there is compression of the right ventricle secondary to left  ventricular enlargement. No pericardial effusion. Normal caliber thoracic aorta. Coronary artery and aortic atherosclerotic calcification. Left chest wall ICD. Mediastinum/Nodes: Trachea and esophagus are unremarkable. No thoracic adenopathy. Lungs/Pleura: Emphysema. Lower lobe atelectasis/scarring. The lungs are otherwise clear. No pleural effusion or pneumothorax. Upper Abdomen: No acute abnormality. Musculoskeletal: No acute fracture. Review of the MIP images confirms the above findings. IMPRESSION:  1. Acute pulmonary embolism in a segmental branch of the left lower lobe pulmonary artery. No evidence of right heart strain. Aortic Atherosclerosis (ICD10-I70.0) and Emphysema (ICD10-J43.9). Critical Value/emergent results were called by telephone at the time of interpretation on 04/09/2024 at 9:52 pm to provider Shyrl Doyne , who verbally acknowledged these results. Electronically Signed   By: Rozell Cornet M.D.   On: 04/09/2024 21:54   DG Chest 2 View Result Date: 04/09/2024 CLINICAL DATA:  Chest pain for 2 days, initial encounter EXAM: CHEST - 2 VIEW COMPARISON:  02/04/2024 FINDINGS: Cardiac shadow is within normal limits. Defibrillator is again seen. Lungs are well aerated bilaterally. No focal infiltrate or effusion is seen. No bony abnormality is noted. IMPRESSION: No active cardiopulmonary disease. Electronically Signed   By: Violeta Grey M.D.   On: 04/09/2024 19:56   VAS US  GROIN PSEUDOANEURYSM Result Date: 03/17/2024  ARTERIAL PSEUDOANEURYSM  Patient Name:  Daniel Li  Date of Exam:   03/17/2024 Medical Rec #: [id number]            Accession #:    [id number] Date of Birth: 1955/12/11            Patient Gender: M Patient Age:   87 years Exam Location:  [facility] Procedure:      VAS US  Marlyne Sing Referring Phys: Thomos Flies --------------------------------------------------------------------------------  Exam: Right groin Indications: Patient complains of groin pain. History: Right groin pain after cerebral arteriogram 5/19. Performing Technologist: Franky Ivanoff Sturdivant-Jones RDMS, RVT  Examination Guidelines: A complete evaluation includes B-mode imaging, spectral Doppler, color Doppler, and power Doppler as needed of all accessible portions of each vessel. Bilateral testing is considered an integral part of a complete examination. Limited examinations for reoccurring indications may be performed as noted. +------------+----------+---------+------+----------+ Right  DuplexPSV (cm/s)Waveform PlaqueComment(s) +------------+----------+---------+------+----------+ CFA             86    triphasic                 +------------+----------+---------+------+----------+ PFA             54    triphasic                 +------------+----------+---------+------+----------+ Prox SFA        64    triphasic                 +------------+----------+---------+------+----------+ Right Vein comments:Patent right common femoral vein.  Summary: No evidence of pseudoaneurysm, AVF or DVT  Diagnosing physician: Genny Kid MD Electronically signed by Genny Kid MD on 03/17/2024 at 7:08:12 PM.   --------------------------------------------------------------------------------    Final    IR ANGIO INTRA EXTRACRAN SEL COM CAROTID INNOMINATE BILAT MOD SED Result Date: 03/17/2024 CLINICAL DATA:  Symptoms of dizziness and vertigo. Suspicion of high-grade stenosis of the intracranial internal carotid arteries. EXAM: BILATERAL COMMON CAROTID AND INNOMINATE ANGIOGRAPHY COMPARISON:  CT angiogram of the head and neck of 03/12/2024. MEDICATIONS: Heparin  1000 units IV; no antibiotic was administered within 1 hour of the procedure. ANESTHESIA/SEDATION: Versed  0 mg IV; Fentanyl  25 mcg IV, and Benadryl  25 mg IV. Moderate Sedation Time:  47 minutes The patient was continuously monitored during the procedure by the interventional radiology nurse under my direct supervision. CONTRAST:  Omnipaque  300 approximately 65 mL. FLUOROSCOPY TIME:  Fluoroscopy Time: 17 minutes 42 seconds (398 mGy). COMPLICATIONS: None immediate. TECHNIQUE: Informed written consent was obtained from the patient after a thorough discussion of the procedural risks, benefits and alternatives. All questions were addressed. Maximal Sterile Barrier Technique was utilized including caps, mask, sterile gowns, sterile gloves, sterile drape, hand hygiene and skin antiseptic. A timeout was performed prior to the initiation of the  procedure. The right groin was prepped and draped in the usual sterile fashion. Thereafter using modified Seldinger technique, transfemoral access into the right common femoral artery was obtained without difficulty. Over an 0.035 inch guidewire, a 5 French Pinnacle sheath was inserted. Through this, and also over an 0.035 inch guidewire, a 5 [place] JB 1 catheter was advanced to the aortic arch region and selectively positioned in the right common carotid artery, the right vertebral artery, the left common carotid artery and the left vertebral artery. FINDINGS: The innominate arteriogram demonstrates mild stenosis of the proximal innominate artery. The proximal right subclavian artery and the right common carotid artery demonstrate wide patency. The right common carotid arteriogram demonstrates the right external carotid artery and its major branches to be widely patent. The right common carotid artery at the bulb demonstrates a small ulcerated plaque along its medial wall. Distal to this the right internal carotid artery opacifies normally to the cranial skull base. The petrous, the cavernous and the supraclinoid right ICA are widely patent. The right middle cerebral artery and the right anterior cerebral artery opacify into the capillary and venous phases. The proximal right vertebral artery demonstrates an approximately 20% stenosis just distal to its origin. The vessel opacifies to the right vertebrobasilar junction including a hypoplastic right posterior-inferior cerebellar artery. The opacified portion of the basilar artery, the posterior cerebral arteries, the superior cerebellar arteries and of the anterior inferior cerebellar arteries demonstrates patency into the capillary and venous phases. Unopacified blood is seen in the basilar artery from the contralateral vertebral artery. Suggestion of a right anterior inferior cerebellar artery/posteroinferior cerebellar artery complex is evident. The left  subclavian arteriogram demonstrates mild stenosis proximally. The left vertebral artery demonstrates wide patency with moderate tortuosity just distal to its origin. Vessel opacifies to the cranial skull base. The left vertebrobasilar junction and the left posterior-inferior cerebellar artery demonstrate wide patency. The opacified portions of the basilar artery, the posterior cerebral arteries, the superior cerebellar and of the anterior-inferior cerebellar arteries demonstrate patency. Unopacified blood is seen in the basilar artery and distally from the contralateral vertebral artery. The left common carotid arteriogram demonstrates the left external carotid artery and its major branches to be widely patent. The left internal carotid artery at the bulb to the cranial skull base is widely patent. Wide patency is maintained of the petrous, the cavernous and the supraclinoid left ICA. The left middle and the left anterior cerebral artery opacify into the capillary and venous phases. Suggestion of a mild focal stenosis is seen of a proximal branch of the superior division of the left middle cerebral artery. IMPRESSION: Wide patency of the supraclinoid ICAs bilaterally intracranially. Approximately 20% stenosis of the proximal right vertebral artery due to eccentric atherosclerotic plaque. Suspicion of a mild stenosis of a proximal branch of the superior division of the left middle cerebral artery. PLAN: As per referring MD. Electronically Signed   By: Dala Dublin.D.  On: 03/17/2024 08:15   IR ANGIO VERTEBRAL SEL VERTEBRAL BILAT MOD SED Result Date: 03/17/2024 CLINICAL DATA:  Symptoms of dizziness and vertigo. Suspicion of high-grade stenosis of the intracranial internal carotid arteries. EXAM: BILATERAL COMMON CAROTID AND INNOMINATE ANGIOGRAPHY COMPARISON:  CT angiogram of the head and neck of 03/12/2024. MEDICATIONS: Heparin  1000 units IV; no antibiotic was administered within 1 hour of the procedure.  ANESTHESIA/SEDATION: Versed  0 mg IV; Fentanyl  25 mcg IV, and Benadryl  25 mg IV. Moderate Sedation Time:  47 minutes The patient was continuously monitored during the procedure by the interventional radiology nurse under my direct supervision. CONTRAST:  Omnipaque  300 approximately 65 mL. FLUOROSCOPY TIME:  Fluoroscopy Time: 17 minutes 42 seconds (398 mGy). COMPLICATIONS: None immediate. TECHNIQUE: Informed written consent was obtained from the patient after a thorough discussion of the procedural risks, benefits and alternatives. All questions were addressed. Maximal Sterile Barrier Technique was utilized including caps, mask, sterile gowns, sterile gloves, sterile drape, hand hygiene and skin antiseptic. A timeout was performed prior to the initiation of the procedure. The right groin was prepped and draped in the usual sterile fashion. Thereafter using modified Seldinger technique, transfemoral access into the right common femoral artery was obtained without difficulty. Over an 0.035 inch guidewire, a 5 French Pinnacle sheath was inserted. Through this, and also over an 0.035 inch guidewire, a 5 [place] JB 1 catheter was advanced to the aortic arch region and selectively positioned in the right common carotid artery, the right vertebral artery, the left common carotid artery and the left vertebral artery. FINDINGS: The innominate arteriogram demonstrates mild stenosis of the proximal innominate artery. The proximal right subclavian artery and the right common carotid artery demonstrate wide patency. The right common carotid arteriogram demonstrates the right external carotid artery and its major branches to be widely patent. The right common carotid artery at the bulb demonstrates a small ulcerated plaque along its medial wall. Distal to this the right internal carotid artery opacifies normally to the cranial skull base. The petrous, the cavernous and the supraclinoid right ICA are widely patent. The right middle  cerebral artery and the right anterior cerebral artery opacify into the capillary and venous phases. The proximal right vertebral artery demonstrates an approximately 20% stenosis just distal to its origin. The vessel opacifies to the right vertebrobasilar junction including a hypoplastic right posterior-inferior cerebellar artery. The opacified portion of the basilar artery, the posterior cerebral arteries, the superior cerebellar arteries and of the anterior inferior cerebellar arteries demonstrates patency into the capillary and venous phases. Unopacified blood is seen in the basilar artery from the contralateral vertebral artery. Suggestion of a right anterior inferior cerebellar artery/posteroinferior cerebellar artery complex is evident. The left subclavian arteriogram demonstrates mild stenosis proximally. The left vertebral artery demonstrates wide patency with moderate tortuosity just distal to its origin. Vessel opacifies to the cranial skull base. The left vertebrobasilar junction and the left posterior-inferior cerebellar artery demonstrate wide patency. The opacified portions of the basilar artery, the posterior cerebral arteries, the superior cerebellar and of the anterior-inferior cerebellar arteries demonstrate patency. Unopacified blood is seen in the basilar artery and distally from the contralateral vertebral artery. The left common carotid arteriogram demonstrates the left external carotid artery and its major branches to be widely patent. The left internal carotid artery at the bulb to the cranial skull base is widely patent. Wide patency is maintained of the petrous, the cavernous and the supraclinoid left ICA. The left middle and the left anterior cerebral artery opacify  into the capillary and venous phases. Suggestion of a mild focal stenosis is seen of a proximal branch of the superior division of the left middle cerebral artery. IMPRESSION: Wide patency of the supraclinoid ICAs bilaterally  intracranially. Approximately 20% stenosis of the proximal right vertebral artery due to eccentric atherosclerotic plaque. Suspicion of a mild stenosis of a proximal branch of the superior division of the left middle cerebral artery. PLAN: As per referring MD. Electronically Signed   By: Luellen Sages M.D.   On: 03/17/2024 08:15    Demaris Fillers, DO  [facility]  If 7PM-7AM, please contact night-coverage [email or web address] Password [email or web address] 04/13/2024, 4:55 PM   LOS: 4 days

## 2024-04-13 NOTE — Progress Notes (Signed)
 PHARMACY - ANTICOAGULATION CONSULT NOTE  Pharmacy Consult for heparin   Indication: pulmonary embolus  Allergies  Allergen Reactions   Pork-Derived Products Other (See Comments)    Unknown     Patient Measurements: Height: 5' 6 (167.6 cm) Weight: 66.4 kg (146 lb 6.2 oz) IBW/kg (Calculated) : 63.8 HEPARIN  DW (KG): 67.9  Vital Signs: Temp: 98.4 F (36.9 C) (06/16 0448) Temp Source: Oral (06/16 0448) BP: 93/64 (06/16 0448) Pulse Rate: 57 (06/16 0448)  Labs: Recent Labs    04/12/24 0237 04/12/24 1141 04/13/24 0435  HGB 13.2  --  12.9*  HCT 42.4  --  41.8  PLT 212  --  227  APTT 73* 67* 74*  HEPARINUNFRC >1.10*  --  0.85*  CREATININE 1.04  --   --     Estimated Creatinine Clearance: 62.2 mL/min (by C-G formula based on SCr of 1.04 mg/dL).   Medical History: Past Medical History:  Diagnosis Date   AICD (automatic cardioverter/defibrillator) present    Anxiety    Arthritis    all over (09/17/2017)   Burn    CAD (coronary artery disease)    a. NSTEMI s/p BMS to 1st Diagonal and distal OM2 in 2007; b. STEMI 03/26/12 s/p BMS to RCA; c. NSTEMI 10/2012 : CTO of LCx (unable to open with PCI) and PL branch, mod dz of LAD and diagonal, and preserved LV systolic fxn, Med Rx;  d.  anterior STEMI in 01/2017 with DES to Proximal LAD   CAD in native artery 08/23/2020   Cardiomyopathy EF 35% on cath 06/30/14, new from jan 2015 07/20/2014   Chest pain    CHF (congestive heart failure) (HCC)    Chronic back pain    all over (09/17/2017)   DDD (degenerative disc disease), cervical    Depression    GERD (gastroesophageal reflux disease)    Headache    a few/wk (09/17/2017)   HTN (hypertension)    Hypercholesterolemia    Mental disorder    Myocardial infarction (HCC)    I've had 7 (09/17/2017)   Pulmonary edema    Respiratory failure (HCC)    Sciatic pain    Sleep apnea    Stroke [facility])    a. multiple dating back to 2002; *I''ve had 5; LUE/LLE weaker since  (09/17/2017)   Tibia fracture (l) leg   Tobacco abuse    Type 2 diabetes mellitus (HCC)    Unstable angina [facility])     Assessment: 68 year old male with new acute PE started on apixaban  loading dose on 6/13. Patient with hematochezia noted overnight. New orders to transition to IV heparin . Patient last received apixaban  in the morning on 6/14, started heparin  evening of 6/14.    aPTT - 74 - therapeutic CBC WNL  Goal of Therapy:  Heparin  level 0.3-0.7 units/ml aPTT 66-102 seconds Monitor platelets by anticoagulation protocol: Yes   Plan:  Cont heparin  1000 units/hr Heparin  level and aPTT daily Continue to monitor H&H and platelets.  Cliffton Dama, PharmD Clinical Pharmacist 04/13/2024 7:59 AM

## 2024-04-14 DIAGNOSIS — I5022 Chronic systolic (congestive) heart failure: Secondary | ICD-10-CM

## 2024-04-14 DIAGNOSIS — I4729 Other ventricular tachycardia: Secondary | ICD-10-CM

## 2024-04-14 DIAGNOSIS — I2510 Atherosclerotic heart disease of native coronary artery without angina pectoris: Secondary | ICD-10-CM | POA: Diagnosis not present

## 2024-04-14 DIAGNOSIS — K625 Hemorrhage of anus and rectum: Secondary | ICD-10-CM | POA: Diagnosis not present

## 2024-04-14 DIAGNOSIS — Z7901 Long term (current) use of anticoagulants: Secondary | ICD-10-CM | POA: Diagnosis not present

## 2024-04-14 DIAGNOSIS — I255 Ischemic cardiomyopathy: Secondary | ICD-10-CM | POA: Diagnosis not present

## 2024-04-14 DIAGNOSIS — I2699 Other pulmonary embolism without acute cor pulmonale: Secondary | ICD-10-CM | POA: Diagnosis not present

## 2024-04-14 DIAGNOSIS — I959 Hypotension, unspecified: Secondary | ICD-10-CM | POA: Diagnosis not present

## 2024-04-14 DIAGNOSIS — F1090 Alcohol use, unspecified, uncomplicated: Secondary | ICD-10-CM | POA: Diagnosis not present

## 2024-04-14 LAB — GLUCOSE, CAPILLARY
Glucose-Capillary: 105 mg/dL — ABNORMAL HIGH (ref 70–99)
Glucose-Capillary: 142 mg/dL — ABNORMAL HIGH (ref 70–99)
Glucose-Capillary: 113 mg/dL — ABNORMAL HIGH (ref 70–99)

## 2024-04-14 LAB — CBC
HCT: 40.1 % (ref 39.0–52.0)
Hemoglobin: 12.5 g/dL — ABNORMAL LOW (ref 13.0–17.0)
MCH: 25.2 pg — ABNORMAL LOW (ref 26.0–34.0)
MCHC: 31.2 g/dL (ref 30.0–36.0)
MCV: 80.7 fL (ref 80.0–100.0)
Platelets: 216 10*3/uL (ref 150–400)
RBC: 4.97 MIL/uL (ref 4.22–5.81)
RDW: 16.4 % — ABNORMAL HIGH (ref 11.5–15.5)
WBC: 6.7 10*3/uL (ref 4.0–10.5)
nRBC: 0 % (ref 0.0–0.2)

## 2024-04-14 LAB — BASIC METABOLIC PANEL WITH GFR
Anion gap: 8 (ref 5–15)
BUN: 16 mg/dL (ref 8–23)
CO2: 27 mmol/L (ref 22–32)
Calcium: 9 mg/dL (ref 8.9–10.3)
Chloride: 106 mmol/L (ref 98–111)
Creatinine, Ser: 1.1 mg/dL (ref 0.61–1.24)
GFR, Estimated: >60 mL/min (ref >60)
Glucose, Bld: 98 mg/dL (ref 70–99)
Potassium: 3.9 mmol/L (ref 3.5–5.1)
Sodium: 141 mmol/L (ref 135–145)

## 2024-04-14 LAB — MAGNESIUM: Magnesium: 2.2 mg/dL (ref 1.7–2.4)

## 2024-04-14 LAB — CORTISOL-AM, BLOOD: Cortisol - AM: 12.7 ug/dL (ref 6.7–22.6)

## 2024-04-14 MED ORDER — VITAMIN B-1 100 MG PO TABS: 100.0000 mg | ORAL_TABLET | Freq: Every day | ORAL | 2 refills | Status: DC

## 2024-04-14 MED ORDER — THIAMINE MONONITRATE 100 MG PO TABS: 100.0000 mg | ORAL_TABLET | Freq: Every day | ORAL | Status: DC

## 2024-04-14 MED ORDER — MIDODRINE HCL 5 MG PO TABS: 5.0000 mg | ORAL_TABLET | Freq: Three times a day (TID) | ORAL | 1 refills | Status: DC

## 2024-04-14 MED ORDER — APIXABAN 5 MG PO TABS: ORAL_TABLET | ORAL | 2 refills | Status: DC

## 2024-04-14 NOTE — TOC Transition Note (Signed)
 Transition of Care [facility]) - Discharge Note   Patient Details  Name: Daniel Li MRN: [id number] Date of Birth: Mar 27, 1956  Transition of Care [facility]) CM/SW Contact:  Ander Katos, LCSW Phone Number: 04/14/2024, 2:35 PM   Clinical Narrative:  Pt d/c today back to [facility]. Pt, pt's contact Coky, and facility aware and agreeable. Facility to pick up pt this afternoon. FL2 and D/C summary sent to facility. Jennifer with [facility] notified of d/c. HHPT order in.      Final next level of care: Assisted Living Barriers to Discharge: Barriers Resolved   Patient Goals and CMS Choice Patient states their goals for this hospitalization and ongoing recovery are:: return to group home          Discharge Placement                Patient to be transferred to facility by: facility vehicle Name of family member notified: Coky Patient and family notified of of transfer: 04/14/24  Discharge Plan and Services Additional resources added to the After Visit Summary for                            [facility] Arranged: PT HH Agency: [facility] Date [facility] Agency Contacted: 04/14/24 Time HH Agency Contacted: [phone] Representative spoke with at [facility] Agency: [facility]  Social Drivers of Health (SDOH) Interventions SDOH Screenings   Food Insecurity: No Food Insecurity (04/09/2024)  Housing: Low Risk  (04/09/2024)  Transportation Needs: No Transportation Needs (04/09/2024)  Recent Concern: Transportation Needs - Unmet Transportation Needs (01/22/2024)  Utilities: Not At Risk (04/09/2024)  Depression (PHQ2-9): Low Risk  (11/05/2023)  Financial Resource Strain: Low Risk  (02/25/2023)   [facility] from [facility]  Social Connections: Moderately Isolated (04/09/2024)  Tobacco Use: Medium Risk (04/09/2024)     Readmission Risk Interventions    04/13/2024   11:23 AM  Readmission Risk Prevention Plan  Transportation Screening Complete  Medication Review (RN Care  Manager) Complete  HRI or Home Care Consult Complete  SW Recovery Care/Counseling Consult Complete  Palliative Care Screening Not Applicable  Skilled Nursing Facility Not Applicable

## 2024-04-14 NOTE — Discharge Summary (Addendum)
 Physician Discharge Summary   Patient: Daniel Li MRN: [id number] DOB: 1956/08/29  Admit date:     04/09/2024  Discharge date: 04/14/24  Discharge Physician: Myrtie Atkinson Marveline Profeta   PCP: Orlena Bitters, MD   Recommendations at discharge:   Please follow up with primary care provider within 1-2 weeks  Please repeat BMP and CBC in one week     Hospital Course: Male with a history of HFrEF (25-30%), coronary disease with multiple stents,NS Vtach  St. Jude ICD in place, history of PE, HTN, HLD, Type 2 DM, Stage 3 CKD, prior LV thrombus, prior polysubstance abuse presenting with chest tightness/sob and lightheaded.   68 y.o. male with medical history significant of hypertension, hyperlipidemia, CAD s/p stent placement, T2DM, alcohol use disorder, chronic combined systolic and diastolic CHF who presents to the emergency department via EMS due to complaint of chest tightness, shortness of breath, increased leg swelling and some dizziness that has been ongoing for several days.  Patient states symptoms were similar to prior hospitalizations.  Chest pain was reproducible and left-sided, he endorsed increased weight gain (about 10 pounds).  Patient states that he had 1 beer PTA.  He was suppose to be apixaban  PTA, but had not been refilled since April 2025.  He reported that Eliquis  was stopped by Dr Rodolfo Clan for ICD procedure and he also might have skipped Eliquis  dose.   His hospitalization was prolonged due to reported rectal bleed.  His apixaban  was switched to IV heparin  and he was monitored closely.  His Hgb remained stable and he did not have any further hematochezia.  He was transitioned to apixaban  and monitored >24 hours back on apixaban  without difficulty.  Assessment and Plan: Acute pulmonary embolus - 04/09/2024 CTA chest--acute PE segmental branches of LLL PA -Chart reviewed. Pt was last filled eliquis  in 4/25, none since  -not consider this Eliquis  failure, as pt was not taking Eliquis  at  time of PE.  - Lower extremity duplex negative for DVT -switched to IV heparin  6/14 -6/16>>transition back to apixaban    Rectal bleeding -6/14 reported by pt -unclear if he truly had rectal bleeding by pt's description -appreciate GI consult -discussed with Dr. Mordechai April -check hemoccult -monitor H/H--remains stable -switch apixaban  to IV heparin  for now -04/12/24--no reports of rectal bleed in last 24 hours -switched back to apixaban  6/16  -04/14/24--no further reports of rectal bleed;  Hgb remains stable -unclear if pt is confabulating stories or developed a degree of Wernicke's -started on thiamin 500 mg IV>>d/c home with thiamine 100 mg po daily   Hypotension -Echo = perserved RV function -hgb stable -appreciate cardiology consult>>d/c Jardiance  -add midodrine -am cortisol 12.7 -SBP improved to low 100s on day of dc   Hypokalemia - Repleting   Chronic HFpEF - clinically compensated - 04/10/2024 HK, normal RVF, moderate MR, mild TR -ICD in place and most recent device interrogation in 10/2023 showed normal device function  -Continue current GDMT with Entresto  24-26 mg twice daily, Spironolactone  25 mg daily, Jardiance  10 mg daily and Digoxin  0.125 mg daily. Beta-blocker therapy previously discontinued given borderline low output. BP does not allow for further titration of GDMT  - last visit with AHF clinic--Entresto  and spironolactone  were stopped secondary to her soft blood pressures.  Jardiance  and digoxin  were continued. - holding torsemide  20 mg daily due to soft BPs   History of LV thrombus -previously been on Coumadin  for LV thrombus and had been switched to Eliquis  and was continued on this.  Elevated troponin - Secondary to demand ischemia - Troponin trend is flat - Troponins 35>> 36>> 37>> 37   Coronary artery disease - history of multiple interventions as discussed above and most recent cardiac catheterization in 2023 showed patent stents and 50% PLV and 50%  PDA stenosis with medical management recommended.  -not on ASA given the need for anticoagulation     NSVT - continued on Amiodarone  200mg  daily -s/p ICD   Alcohol abuse - Alcohol level 68 - No signs of withdrawal - start thiamine   Diabetes mellitus type 2 with hyperglycemia Hemoglobin A1c 6.8 on 01/22/2024 Cont SSI as needed   Mixed hyperlipidemia -continue zetia    Hypothyroidism -continue synthroid      Consultants: cardiology Procedures performed: none  Disposition: Assisted living Diet recommendation:  Carb modified diet DISCHARGE MEDICATION: Allergies as of 04/14/2024       Reactions   Pork-derived Products Other (See Comments)   Unknown         Medication List     STOP taking these medications    Jardiance  10 MG Tabs tablet Generic drug: empagliflozin    spironolactone  25 MG tablet Commonly known as: ALDACTONE        TAKE these medications    acetaminophen  325 MG tablet Commonly known as: TYLENOL  Take 650 mg by mouth every 6 (six) hours as needed for mild pain.   albuterol  108 (90 Base) MCG/ACT inhaler Commonly known as: VENTOLIN  HFA Inhale 2 puffs into the lungs every 4 (four) hours as needed for wheezing or shortness of breath.   amiodarone  200 MG tablet Commonly known as: PACERONE  Take 1 tablet (200 mg total) by mouth daily.   apixaban  5 MG Tabs tablet Commonly known as: ELIQUIS  Take 2 tablets (10 mg total) by mouth 2 (two) times daily for 6 days, THEN 1 tablet (5 mg total) 2 (two) times daily. Start taking on: April 14, 2024 What changed: See the new instructions.   digoxin  0.125 MG tablet Commonly known as: LANOXIN  TAKE (1) TABLET BY MOUTH ONCE DAILY. What changed: See the new instructions.   ezetimibe  10 MG tablet Commonly known as: Zetia  Take 1 tablet (10 mg total) by mouth daily.   gabapentin  100 MG capsule Commonly known as: NEURONTIN  Take 100 mg by mouth 2 (two) times daily.   levothyroxine  100 MCG tablet Commonly known  as: SYNTHROID  Take 1 tablet (100 mcg total) by mouth daily at 6 (six) AM.   lidocaine  5 % Commonly known as: LIDODERM  Place 1 patch onto the skin every 12 (twelve) hours. Leave off 12 hours   metFORMIN  500 MG tablet Commonly known as: GLUCOPHAGE  Take 500 mg by mouth daily with breakfast.   midodrine 5 MG tablet Commonly known as: PROAMATINE Take 1 tablet (5 mg total) by mouth 3 (three) times daily with meals.   nitroGLYCERIN  0.4 MG SL tablet Commonly known as: NITROSTAT  PLACE ONE (1) TABLET UNDER TONGUE EVERY 5 MINUTES UP TO (3) DOSES AS NEEDED FOR CHEST PAIN. IF NO RELIEF, CONTACT MD.   pantoprazole  40 MG tablet Commonly known as: PROTONIX  Take 40 mg by mouth daily.   potassium chloride  10 MEQ tablet Commonly known as: KLOR-CON  Take 2 tablets (20 mEq total) by mouth daily.   thiamine 100 MG tablet Commonly known as: Vitamin B-1 Take 1 tablet (100 mg total) by mouth daily. Start taking on: April 15, 2024   torsemide  20 MG tablet Commonly known as: DEMADEX  TAKE (1) TABLET BY MOUTH ONCE DAILY. What changed: See the new  instructions.        Follow-up Information     [facility] Follow up.   Specialty: Home Health Services Why: PT will call to schedule your first home visit. Contact information: [address] [place] [address] [phone]         [facility] Follow up on 04/23/2024.   Specialty: Cardiology Why: Follow up with HF clinic located at [facility]. Contact information: [address] [place]  [phone]               Discharge Exam: Filed Weights   04/12/24 0500 04/13/24 0500 04/14/24 0454  Weight: 67.5 kg 66.4 kg 66.4 kg   HEENT:  [place]/AT, No thrush, no icterus CV:  RRR, no rub, no S3, no S4 Lung:  fine basilar rales. No wheeze Abd:  soft/+BS, NT Ext:  No edema, no lymphangitis, no synovitis, no rash   Condition at discharge:  stable  The results of significant diagnostics from this hospitalization (including imaging, microbiology, ancillary and laboratory) are listed below for reference.   Imaging Studies: ECHOCARDIOGRAM COMPLETE Result Date: 04/10/2024    ECHOCARDIOGRAM REPORT   Patient Name:   ED MANDICH Date of Exam: 04/10/2024 Medical Rec #:  [id number]           Height:       66.0 in Accession #:    [id number]          Weight:       149.7 lb Date of Birth:  [birth year]           BSA:          1.768 m Patient Age:    67 years            BP:           100/63 mmHg Patient Gender: M                   HR:           62 bpm. Exam Location:  [facility] Procedure: 2D Echo, 3D Echo, Cardiac Doppler, Color Doppler and Intracardiac            Opacification Agent (Both Spectral and Color Flow Doppler were            utilized during procedure). Indications:    Pulmonary Embolus I26.09  History:        Patient has prior history of Echocardiogram examinations, most                 recent 01/23/2024. CHF and Cardiomyopathy, CAD and Previous                 Myocardial Infarction, Defibrillator, Stroke and TIA; Risk                 Factors:Hypertension, Diabetes, Dyslipidemia, Current Smoker and                 Sleep Apnea.  Sonographer:    Denese Finn RCS Referring Phys: [id number] OLADAPO ADEFESO IMPRESSIONS  1. Left ventricular ejection fraction, by estimation, is 25 to 30%. The left ventricle has normal function. The left ventricle demonstrates global hypokinesis. The left ventricular internal cavity size was severely dilated. Left ventricular diastolic parameters are indeterminate. Elevated left atrial pressure.  2. Right ventricular systolic function is normal. The right ventricular size is normal.  3. Left atrial size was severely dilated.  4. MR vena contraca is 0.6 cm. The mitral valve is abnormal. Moderate mitral valve regurgitation. No evidence of mitral stenosis.  5. The tricuspid valve is abnormal.  6. The aortic valve is  tricuspid. Aortic valve regurgitation is not visualized. No aortic stenosis is present.  7. The inferior vena cava is normal in size with greater than 50% respiratory variability, suggesting right atrial pressure of 3 mmHg. FINDINGS  Left Ventricle: Left ventricular ejection fraction, by estimation, is 25 to 30%. The left ventricle has normal function. The left ventricle demonstrates global hypokinesis. Definity  contrast agent was given IV to delineate the left ventricular endocardial borders. The left ventricular internal cavity size was severely dilated. There is no left ventricular hypertrophy. Left ventricular diastolic parameters are indeterminate. Elevated left atrial pressure. Right Ventricle: The right ventricular size is normal. Right vetricular wall thickness was not well visualized. Right ventricular systolic function is normal. Left Atrium: Left atrial size was severely dilated. Right Atrium: Right atrial size was normal in size. Pericardium: There is no evidence of pericardial effusion. Mitral Valve: MR vena contraca is 0.6 cm. The mitral valve is abnormal. Moderate mitral valve regurgitation. No evidence of mitral valve stenosis. Tricuspid Valve: The tricuspid valve is abnormal. Tricuspid valve regurgitation is mild . No evidence of tricuspid stenosis. Aortic Valve: The aortic valve is tricuspid. Aortic valve regurgitation is not visualized. No aortic stenosis is present. Aortic valve mean gradient measures 2.6 mmHg. Aortic valve peak gradient measures 5.2 mmHg. Aortic valve area, by VTI measures 2.21 cm. Pulmonic Valve: The pulmonic valve was not well visualized. Pulmonic valve regurgitation is not visualized. No evidence of pulmonic stenosis. Aorta: The aortic root is normal in size and structure and the ascending aorta was not well visualized. Venous: The inferior vena cava is normal in size with greater than 50% respiratory variability, suggesting right atrial pressure of 3 mmHg. IAS/Shunts: No  atrial level shunt detected by color flow Doppler.  LEFT VENTRICLE PLAX 2D LVIDd:         7.30 cm      Diastology LVIDs:         6.60 cm      LV e' medial:    4.90 cm/s LV PW:         0.90 cm      LV E/e' medial:  15.9 LV IVS:        0.80 cm      LV e' lateral:   3.59 cm/s LVOT diam:     2.10 cm      LV E/e' lateral: 21.7 LV SV:         49 LV SV Index:   28 LVOT Area:     3.46 cm                              3D Volume EF: LV Volumes (MOD)            3D EF:        36 % LV vol d, MOD A2C: 241.0 ml LV EDV:       282 ml LV vol d, MOD A4C: 299.0 ml LV ESV:       182 ml LV vol s, MOD A2C: 169.0 ml LV SV:        100 ml LV vol s, MOD A4C: 203.0 ml LV SV MOD A2C:     72.0 ml LV SV MOD A4C:  299.0 ml LV SV MOD BP:      83.5 ml RIGHT VENTRICLE RV S prime:     10.20 cm/s TAPSE (M-mode): 2.1 cm LEFT ATRIUM              Index        RIGHT ATRIUM           Index LA diam:        4.40 cm  2.49 cm/m   RA Area:     16.50 cm LA Vol (A2C):   115.0 ml 65.04 ml/m  RA Volume:   41.90 ml  23.70 ml/m LA Vol (A4C):   84.3 ml  47.68 ml/m LA Biplane Vol: 102.0 ml 57.69 ml/m  AORTIC VALVE AV Area (Vmax):    2.34 cm AV Area (Vmean):   2.52 cm AV Area (VTI):     2.21 cm AV Vmax:           113.49 cm/s AV Vmean:          74.459 cm/s AV VTI:            0.223 m AV Peak Grad:      5.2 mmHg AV Mean Grad:      2.6 mmHg LVOT Vmax:         76.70 cm/s LVOT Vmean:        54.100 cm/s LVOT VTI:          0.142 m LVOT/AV VTI ratio: 0.64  AORTA Ao Root diam: 3.90 cm MITRAL VALVE MV Area (PHT): 4.57 cm       SHUNTS MV Decel Time: 166 msec       Systemic VTI:  0.14 m MR Peak grad:    80.3 mmHg    Systemic Diam: 2.10 cm MR Mean grad:    48.0 mmHg MR Vmax:         448.00 cm/s MR Vmean:        320.0 cm/s MR PISA:         2.26 cm MR PISA Eff ROA: 14 mm MR PISA Radius:  0.60 cm MV E velocity: 77.90 cm/s MV A velocity: 96.30 cm/s MV E/A ratio:  0.81 Armida Lander MD Electronically signed by Armida Lander MD Signature Date/Time: 04/10/2024/3:29:16 PM     Final    US  Venous Img Lower Bilateral (DVT) Result Date: 04/10/2024 CLINICAL DATA:  Right lower extremity edema EXAM: RIGHT LOWER EXTREMITY VENOUS DOPPLER ULTRASOUND TECHNIQUE: Gray-scale sonography with compression, as well as color and duplex ultrasound, were performed to evaluate the deep venous system(s) from the level of the common femoral vein through the popliteal and proximal calf veins. COMPARISON:  None Available. FINDINGS: VENOUS Normal compressibility of the common femoral, superficial femoral, and popliteal veins, as well as the visualized calf veins. Visualized portions of profunda femoral vein and great saphenous vein unremarkable. No filling defects to suggest DVT on grayscale or color Doppler imaging. Doppler waveforms show normal direction of venous flow, normal respiratory plasticity and response to augmentation. Limited views of the contralateral common femoral vein are unremarkable. OTHER None. Limitations: none IMPRESSION: Negative. Electronically Signed   By: Fernando Hoyer M.D.   On: 04/10/2024 09:20   CT Angio Chest PE W and/or Wo Contrast Result Date: 04/09/2024 CLINICAL DATA:  Pulmonary embolism suspected. Positive D-dimer. Chest pain for 2-3 days. EXAM: CT ANGIOGRAPHY CHEST WITH CONTRAST TECHNIQUE: Multidetector CT imaging of the chest was performed using the standard protocol during bolus administration of intravenous contrast. Multiplanar CT image  reconstructions and MIPs were obtained to evaluate the vascular anatomy. RADIATION DOSE REDUCTION: This exam was performed according to the departmental dose-optimization program which includes automated exposure control, adjustment of the mA and/or kV according to patient size and/or use of iterative reconstruction technique. CONTRAST:  75mL OMNIPAQUE  IOHEXOL  350 MG/ML SOLN COMPARISON:  Chest radiograph 04/09/2024 and CTA chest 01/22/2024 FINDINGS: Cardiovascular: Nonocclusive lobar pulmonary embolism in the left lower lobe  pulmonary artery with associated wispy stranding. This may represent chronic thromboembolism. Acute appearing pulmonary embolism in a segmental branch of the left lower lobe pulmonary artery. The left ventricle is dilated. No evidence of right heart strain as there is compression of the right ventricle secondary to left ventricular enlargement. No pericardial effusion. Normal caliber thoracic aorta. Coronary artery and aortic atherosclerotic calcification. Left chest wall ICD. Mediastinum/Nodes: Trachea and esophagus are unremarkable. No thoracic adenopathy. Lungs/Pleura: Emphysema. Lower lobe atelectasis/scarring. The lungs are otherwise clear. No pleural effusion or pneumothorax. Upper Abdomen: No acute abnormality. Musculoskeletal: No acute fracture. Review of the MIP images confirms the above findings. IMPRESSION: 1. Acute pulmonary embolism in a segmental branch of the left lower lobe pulmonary artery. No evidence of right heart strain. Aortic Atherosclerosis (ICD10-I70.0) and Emphysema (ICD10-J43.9). Critical Value/emergent results were called by telephone at the time of interpretation on 04/09/2024 at 9:52 pm to provider Shyrl Doyne , who verbally acknowledged these results. Electronically Signed   By: Rozell Cornet M.D.   On: 04/09/2024 21:54   DG Chest 2 View Result Date: 04/09/2024 CLINICAL DATA:  Chest pain for 2 days, initial encounter EXAM: CHEST - 2 VIEW COMPARISON:  02/04/2024 FINDINGS: Cardiac shadow is within normal limits. Defibrillator is again seen. Lungs are well aerated bilaterally. No focal infiltrate or effusion is seen. No bony abnormality is noted. IMPRESSION: No active cardiopulmonary disease. Electronically Signed   By: Violeta Grey M.D.   On: 04/09/2024 19:56   VAS US  GROIN PSEUDOANEURYSM Result Date: 03/17/2024  ARTERIAL PSEUDOANEURYSM  Patient Name:  ILLYA GIENGER  Date of Exam:   03/17/2024 Medical Rec #: [id number]            Accession #:    [id number] Date of Birth:  [birth year]            Patient Gender: M Patient Age:   74 years Exam [facility]  [facility] Procedure:      VAS US  GROIN PSEUDOANEURYSM Referring Phys: Thomos Flies --------------------------------------------------------------------------------  Exam: Right groin Indications: Patient complains of groin pain. History: Right groin pain after cerebral arteriogram 5/19. Performing Technologist: Franky Ivanoff Sturdivant-Jones RDMS, RVT  Examination Guidelines: A complete evaluation includes B-mode imaging, spectral Doppler, color Doppler, and power Doppler as needed of all accessible portions of each vessel. Bilateral testing is considered an integral part of a complete examination. Limited examinations for reoccurring indications may be performed as noted. +------------+----------+---------+------+----------+ Right DuplexPSV (cm/s)Waveform PlaqueComment(s) +------------+----------+---------+------+----------+ CFA             86    triphasic                 +------------+----------+---------+------+----------+ PFA             54    triphasic                 +------------+----------+---------+------+----------+ Prox SFA        64    triphasic                 +------------+----------+---------+------+----------+ Right Vein comments:Patent right common  femoral vein.  Summary: No evidence of pseudoaneurysm, AVF or DVT  Diagnosing physician: Genny Kid MD Electronically signed by Genny Kid MD on 03/17/2024 at 7:08:12 PM.   --------------------------------------------------------------------------------    Final    IR ANGIO INTRA EXTRACRAN SEL COM CAROTID INNOMINATE BILAT MOD SED Result Date: 03/17/2024 CLINICAL DATA:  Symptoms of dizziness and vertigo. Suspicion of high-grade stenosis of the intracranial internal carotid arteries. EXAM: BILATERAL COMMON CAROTID AND INNOMINATE ANGIOGRAPHY COMPARISON:  CT angiogram of the head and neck of 03/12/2024. MEDICATIONS: Heparin  1000 units IV; no  antibiotic was administered within 1 hour of the procedure. ANESTHESIA/SEDATION: Versed  0 mg IV; Fentanyl  25 mcg IV, and Benadryl  25 mg IV. Moderate Sedation Time:  47 minutes The patient was continuously monitored during the procedure by the interventional radiology nurse under my direct supervision. CONTRAST:  Omnipaque  300 approximately 65 mL. FLUOROSCOPY TIME:  Fluoroscopy Time: 17 minutes 42 seconds (398 mGy). COMPLICATIONS: None immediate. TECHNIQUE: Informed written consent was obtained from the patient after a thorough discussion of the procedural risks, benefits and alternatives. All questions were addressed. Maximal Sterile Barrier Technique was utilized including caps, mask, sterile gowns, sterile gloves, sterile drape, hand hygiene and skin antiseptic. A timeout was performed prior to the initiation of the procedure. The right groin was prepped and draped in the usual sterile fashion. Thereafter using modified Seldinger technique, transfemoral access into the right common femoral artery was obtained without difficulty. Over an 0.035 inch guidewire, a 5 French Pinnacle sheath was inserted. Through this, and also over an 0.035 inch guidewire, a 5 [place] JB 1 catheter was advanced to the aortic arch region and selectively positioned in the right common carotid artery, the right vertebral artery, the left common carotid artery and the left vertebral artery. FINDINGS: The innominate arteriogram demonstrates mild stenosis of the proximal innominate artery. The proximal right subclavian artery and the right common carotid artery demonstrate wide patency. The right common carotid arteriogram demonstrates the right external carotid artery and its major branches to be widely patent. The right common carotid artery at the bulb demonstrates a small ulcerated plaque along its medial wall. Distal to this the right internal carotid artery opacifies normally to the cranial skull base. The petrous, the cavernous and the  supraclinoid right ICA are widely patent. The right middle cerebral artery and the right anterior cerebral artery opacify into the capillary and venous phases. The proximal right vertebral artery demonstrates an approximately 20% stenosis just distal to its origin. The vessel opacifies to the right vertebrobasilar junction including a hypoplastic right posterior-inferior cerebellar artery. The opacified portion of the basilar artery, the posterior cerebral arteries, the superior cerebellar arteries and of the anterior inferior cerebellar arteries demonstrates patency into the capillary and venous phases. Unopacified blood is seen in the basilar artery from the contralateral vertebral artery. Suggestion of a right anterior inferior cerebellar artery/posteroinferior cerebellar artery complex is evident. The left subclavian arteriogram demonstrates mild stenosis proximally. The left vertebral artery demonstrates wide patency with moderate tortuosity just distal to its origin. Vessel opacifies to the cranial skull base. The left vertebrobasilar junction and the left posterior-inferior cerebellar artery demonstrate wide patency. The opacified portions of the basilar artery, the posterior cerebral arteries, the superior cerebellar and of the anterior-inferior cerebellar arteries demonstrate patency. Unopacified blood is seen in the basilar artery and distally from the contralateral vertebral artery. The left common carotid arteriogram demonstrates the left external carotid artery and its major branches to be widely patent. The left internal carotid  artery at the bulb to the cranial skull base is widely patent. Wide patency is maintained of the petrous, the cavernous and the supraclinoid left ICA. The left middle and the left anterior cerebral artery opacify into the capillary and venous phases. Suggestion of a mild focal stenosis is seen of a proximal branch of the superior division of the left middle cerebral artery.  IMPRESSION: Wide patency of the supraclinoid ICAs bilaterally intracranially. Approximately 20% stenosis of the proximal right vertebral artery due to eccentric atherosclerotic plaque. Suspicion of a mild stenosis of a proximal branch of the superior division of the left middle cerebral artery. PLAN: As per referring MD. Electronically Signed   By: Luellen Sages M.D.   On: 03/17/2024 08:15   IR ANGIO VERTEBRAL SEL VERTEBRAL BILAT MOD SED Result Date: 03/17/2024 CLINICAL DATA:  Symptoms of dizziness and vertigo. Suspicion of high-grade stenosis of the intracranial internal carotid arteries. EXAM: BILATERAL COMMON CAROTID AND INNOMINATE ANGIOGRAPHY COMPARISON:  CT angiogram of the head and neck of 03/12/2024. MEDICATIONS: Heparin  1000 units IV; no antibiotic was administered within 1 hour of the procedure. ANESTHESIA/SEDATION: Versed  0 mg IV; Fentanyl  25 mcg IV, and Benadryl  25 mg IV. Moderate Sedation Time:  47 minutes The patient was continuously monitored during the procedure by the interventional radiology nurse under my direct supervision. CONTRAST:  Omnipaque  300 approximately 65 mL. FLUOROSCOPY TIME:  Fluoroscopy Time: 17 minutes 42 seconds (398 mGy). COMPLICATIONS: None immediate. TECHNIQUE: Informed written consent was obtained from the patient after a thorough discussion of the procedural risks, benefits and alternatives. All questions were addressed. Maximal Sterile Barrier Technique was utilized including caps, mask, sterile gowns, sterile gloves, sterile drape, hand hygiene and skin antiseptic. A timeout was performed prior to the initiation of the procedure. The right groin was prepped and draped in the usual sterile fashion. Thereafter using modified Seldinger technique, transfemoral access into the right common femoral artery was obtained without difficulty. Over an 0.035 inch guidewire, a 5 French Pinnacle sheath was inserted. Through this, and also over an 0.035 inch guidewire, a 5 [place] JB  1 catheter was advanced to the aortic arch region and selectively positioned in the right common carotid artery, the right vertebral artery, the left common carotid artery and the left vertebral artery. FINDINGS: The innominate arteriogram demonstrates mild stenosis of the proximal innominate artery. The proximal right subclavian artery and the right common carotid artery demonstrate wide patency. The right common carotid arteriogram demonstrates the right external carotid artery and its major branches to be widely patent. The right common carotid artery at the bulb demonstrates a small ulcerated plaque along its medial wall. Distal to this the right internal carotid artery opacifies normally to the cranial skull base. The petrous, the cavernous and the supraclinoid right ICA are widely patent. The right middle cerebral artery and the right anterior cerebral artery opacify into the capillary and venous phases. The proximal right vertebral artery demonstrates an approximately 20% stenosis just distal to its origin. The vessel opacifies to the right vertebrobasilar junction including a hypoplastic right posterior-inferior cerebellar artery. The opacified portion of the basilar artery, the posterior cerebral arteries, the superior cerebellar arteries and of the anterior inferior cerebellar arteries demonstrates patency into the capillary and venous phases. Unopacified blood is seen in the basilar artery from the contralateral vertebral artery. Suggestion of a right anterior inferior cerebellar artery/posteroinferior cerebellar artery complex is evident. The left subclavian arteriogram demonstrates mild stenosis proximally. The left vertebral artery demonstrates wide patency with moderate  tortuosity just distal to its origin. Vessel opacifies to the cranial skull base. The left vertebrobasilar junction and the left posterior-inferior cerebellar artery demonstrate wide patency. The opacified portions of the basilar  artery, the posterior cerebral arteries, the superior cerebellar and of the anterior-inferior cerebellar arteries demonstrate patency. Unopacified blood is seen in the basilar artery and distally from the contralateral vertebral artery. The left common carotid arteriogram demonstrates the left external carotid artery and its major branches to be widely patent. The left internal carotid artery at the bulb to the cranial skull base is widely patent. Wide patency is maintained of the petrous, the cavernous and the supraclinoid left ICA. The left middle and the left anterior cerebral artery opacify into the capillary and venous phases. Suggestion of a mild focal stenosis is seen of a proximal branch of the superior division of the left middle cerebral artery. IMPRESSION: Wide patency of the supraclinoid ICAs bilaterally intracranially. Approximately 20% stenosis of the proximal right vertebral artery due to eccentric atherosclerotic plaque. Suspicion of a mild stenosis of a proximal branch of the superior division of the left middle cerebral artery. PLAN: As per referring MD. Electronically Signed   By: Luellen Sages M.D.   On: 03/17/2024 08:15    Microbiology: Results for orders placed or performed during the hospital encounter of 01/22/24  Resp panel by RT-PCR (RSV, Flu A&B, Covid) Anterior Nasal Swab     Status: None   Collection Time: 01/22/24 10:18 AM   Specimen: Anterior Nasal Swab  Result Value Ref Range Status   SARS Coronavirus 2 by RT PCR NEGATIVE NEGATIVE Final    Comment: (NOTE) SARS-CoV-2 target nucleic acids are NOT DETECTED.  The SARS-CoV-2 RNA is generally detectable in upper respiratory specimens during the acute phase of infection. The lowest concentration of SARS-CoV-2 viral copies this assay can detect is 138 copies/mL. A negative result does not preclude SARS-Cov-2 infection and should not be used as the sole basis for treatment or other patient management decisions. A  negative result may occur with  improper specimen collection/handling, submission of specimen other than nasopharyngeal swab, presence of viral mutation(s) within the areas targeted by this assay, and inadequate number of viral copies(<138 copies/mL). A negative result must be combined with clinical observations, patient history, and epidemiological information. The expected result is Negative.  Fact Sheet for Patients:  BloggerCourse.com  Fact Sheet for Healthcare Providers:  SeriousBroker.it  This test is no t yet approved or cleared by the [place]  FDA and  has been authorized for detection and/or diagnosis of SARS-CoV-2 by FDA under an Emergency Use Authorization (EUA). This EUA will remain  in effect (meaning this test can be used) for the duration of the COVID-19 declaration under Section 564(b)(1) of the Act, 21 U.S.C.section 360bbb-3(b)(1), unless the authorization is terminated  or revoked sooner.       Influenza A by PCR NEGATIVE NEGATIVE Final   Influenza B by PCR NEGATIVE NEGATIVE Final    Comment: (NOTE) The Xpert Xpress SARS-CoV-2/FLU/RSV plus assay is intended as an aid in the diagnosis of influenza from Nasopharyngeal swab specimens and should not be used as a sole basis for treatment. Nasal washings and aspirates are unacceptable for Xpert Xpress SARS-CoV-2/FLU/RSV testing.  Fact Sheet for Patients: BloggerCourse.com  Fact Sheet for Healthcare Providers: SeriousBroker.it  This test is not yet approved or cleared by the [place]  FDA and has been authorized for detection and/or diagnosis of SARS-CoV-2 by FDA under an Emergency Use Authorization (EUA). This  EUA will remain in effect (meaning this test can be used) for the duration of the COVID-19 declaration under Section 564(b)(1) of the Act, 21 U.S.C. section 360bbb-3(b)(1), unless the authorization  is terminated or revoked.     Resp Syncytial Virus by PCR NEGATIVE NEGATIVE Final    Comment: (NOTE) Fact Sheet for Patients: BloggerCourse.com  Fact Sheet for Healthcare Providers: SeriousBroker.it  This test is not yet approved or cleared by the [place]  FDA and has been authorized for detection and/or diagnosis of SARS-CoV-2 by FDA under an Emergency Use Authorization (EUA). This EUA will remain in effect (meaning this test can be used) for the duration of the COVID-19 declaration under Section 564(b)(1) of the Act, 21 U.S.C. section 360bbb-3(b)(1), unless the authorization is terminated or revoked.  Performed at [facility], [address] [place], [place] [address]     Labs: CBC: Recent Labs  Lab 04/09/24 1923 04/10/24 0223 04/12/24 0237 04/13/24 0435 04/14/24 0519  WBC 6.7 5.8 5.4 5.9 6.7  HGB 12.9* 13.2 13.2 12.9* 12.5*  HCT 39.7 41.1 42.4 41.8 40.1  MCV 80.7 81.2 82.0 81.2 80.7  PLT 261 258 212 227 216   Basic Metabolic Panel: Recent Labs  Lab 04/09/24 1923 04/10/24 0223 04/12/24 0237 04/14/24 0519  NA 140 141 138 141  K 3.0* 4.0 3.8 3.9  CL 103 104 104 106  CO2 22 27 24 27   GLUCOSE 191* 161* 99 98  BUN 12 12 15 16   CREATININE 1.25* 1.11 1.04 1.10  CALCIUM  8.8* 8.7* 8.8* 9.0  MG 2.2 2.2 2.2 2.2  PHOS  --  3.8  --   --    Liver Function Tests: Recent Labs  Lab 04/10/24 0223  AST 29  ALT 19  ALKPHOS 66  BILITOT 0.5  PROT 6.6  ALBUMIN 3.4*   CBG: Recent Labs  Lab 04/13/24 1115 04/13/24 1622 04/13/24 2043 04/14/24 0713 04/14/24 1111  GLUCAP 142* 112* 366* 105* 142*    Discharge time spent: greater than 30 minutes.  Signed: Demaris Fillers, MD [facility] 04/14/2024

## 2024-04-14 NOTE — NC FL2 (Signed)
 [place]  MEDICAID [id number] LEVEL OF CARE FORM     IDENTIFICATION  Patient Name: Daniel Li Birthdate: May 15, 1956 Sex: male Admission Date (Current Location): 04/09/2024  [place] and [place] Number:  [name] [id number] [facility] Facility and Address:  [facility],  [address] [place] [address]      Provider Number: [id number]  Attending Physician Name and Address:  Demaris Fillers, MD  Relative Name and Phone Number:       Current Level of Care: Hospital Recommended Level of Care: Other (Comment) (group home) Prior Approval Number:    Date Approved/Denied:   PASRR Number:    Discharge Plan: Other (Comment) (group home)    Current Diagnoses: Patient Active Problem List   Diagnosis Date Noted   Rectal bleeding 04/11/2024   Chronic anticoagulation 04/11/2024   Elevated d-dimer 04/10/2024   Hypokalemia 04/10/2024   Elevated brain natriuretic peptide (BNP) level 04/10/2024   Left leg swelling 04/10/2024   Alcohol use disorder 04/10/2024   Type 2 diabetes mellitus with hyperglycemia (HCC) 04/10/2024   Mixed hyperlipidemia 04/10/2024   Prolonged QT interval 04/10/2024   Acute pulmonary embolism (HCC) 04/09/2024   Anemia 03/14/2024   Hypotension due to hypovolemia 03/14/2024   Acquired hypothyroidism 03/13/2024   History of CVA with residual deficit 03/13/2024   Internal carotid artery occlusion 03/13/2024   Near syncope  Dizziness 03/12/2024   Chronic health problem 03/12/2024   AAA (abdominal aortic aneurysm) without rupture (HCC) 03/03/2024   Moderate mitral insufficiency 08/07/2023   Cocaine positive 12/09/2022   Mitral regurgitation 03/22/2022   CAD in native artery 08/23/2020   Unstable angina (HCC) 08/22/2020   Status post coronary artery stent placement    Hyponatremia    GERD (gastroesophageal reflux disease)    Dehydration    Acute on chronic renal failure (HCC) 05/25/2018   Abdominal pain, acute, epigastric 05/17/2018   Hernia of abdominal  wall 05/17/2018   Presumed Gastritis 05/17/2018   Generalized abdominal pain    Elevated troponin    Chest pain 04/29/2018   ICD (implantable cardioverter-defibrillator) in place 03/05/2018   Anticoagulated on Coumadin  03/05/2018   Non-STEMI (non-ST elevated myocardial infarction) (HCC) 02/12/2018   Precordial chest pain 02/11/2018   Chronic systolic CHF (congestive heart failure) (HCC) 02/11/2018   Chronic combined systolic (congestive) and diastolic (congestive) heart failure (HCC)    STEMI (ST elevation myocardial infarction) (HCC) 02/21/2017   Ischemic cardiomyopathy 07/20/2014   High risk medication use 01/28/2013   Special screening for malignant neoplasms, colon 01/28/2013   Hypercholesterolemia    Tobacco abuse    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   Benign essential HTN 03/26/2012   CAD S/P percutaneous coronary angioplasty 03/26/2012    Orientation RESPIRATION BLADDER Height & Weight     Self, Time, Situation, Place  Normal Continent Weight: 146 lb 6.2 oz (66.4 kg) Height:  5' 6 (167.6 cm)  BEHAVIORAL SYMPTOMS/MOOD NEUROLOGICAL BOWEL NUTRITION STATUS      Continent Diet (Carb modified)  AMBULATORY STATUS COMMUNICATION OF NEEDS Skin   Limited Assist Verbally Normal                       Personal Care Assistance Level of Assistance  Bathing, Feeding, Dressing Bathing Assistance: Limited assistance Feeding assistance: Limited assistance Dressing Assistance: Limited assistance     Functional Limitations Info  Sight, Hearing, Speech Sight Info: Adequate Hearing Info: Adequate Speech Info: Adequate    SPECIAL CARE FACTORS FREQUENCY  PT (By  licensed PT)     PT Frequency: [facility]              Contractures      Additional Factors Info  Code Status, Allergies Code Status Info: Full Allergies Info: Pork-derived products           Current Medications (04/14/2024):  This is the current hospital active  medication list Current Facility-Administered Medications  Medication Dose Route Frequency Provider Last Rate Last Admin   acetaminophen  (TYLENOL ) tablet 650 mg  650 mg Oral Q6H PRN Adefeso, Oladapo, DO   650 mg at 04/14/24 0018   Or   acetaminophen  (TYLENOL ) suppository 650 mg  650 mg Rectal Q6H PRN Adefeso, Oladapo, DO       amiodarone  (PACERONE ) tablet 200 mg  200 mg Oral Daily Adefeso, Oladapo, DO   200 mg at 04/14/24 1610   apixaban  (ELIQUIS ) tablet 10 mg  10 mg Oral BID Demaris Fillers, MD   10 mg at 04/14/24 9604   Followed by   Cecily Cohen ON 04/20/2024] apixaban  (ELIQUIS ) tablet 5 mg  5 mg Oral BID Tat, Myrtie Atkinson, MD       digoxin  (LANOXIN ) tablet 0.125 mg  0.125 mg Oral Daily Tat, David, MD   0.125 mg at 04/14/24 5409   ezetimibe  (ZETIA ) tablet 10 mg  10 mg Oral Daily Adefeso, Oladapo, DO   10 mg at 04/14/24 8119   insulin  aspart (novoLOG ) injection 0-9 Units  0-9 Units Subcutaneous TID WC Demaris Fillers, MD   1 Units at 04/14/24 1308   levothyroxine  (SYNTHROID ) tablet 100 mcg  100 mcg Oral Q0600 Adefeso, Oladapo, DO   100 mcg at 04/14/24 0527   melatonin tablet 6 mg  6 mg Oral QHS PRN Adefeso, Oladapo, DO   6 mg at 04/12/24 2302   midodrine (PROAMATINE) tablet 5 mg  5 mg Oral TID WC Demaris Fillers, MD   5 mg at 04/14/24 1304   ondansetron  (ZOFRAN ) tablet 4 mg  4 mg Oral Q6H PRN Adefeso, Oladapo, DO       Or   ondansetron  (ZOFRAN ) injection 4 mg  4 mg Intravenous Q6H PRN Adefeso, Oladapo, DO       pantoprazole  (PROTONIX ) EC tablet 40 mg  40 mg Oral Daily Adefeso, Oladapo, DO   40 mg at 04/14/24 0811   thiamine (VITAMIN B1) 500 mg in sodium chloride  0.9 % 50 mL IVPB  500 mg Intravenous Q8H Tat, David, MD 110 mL/hr at 04/14/24 1305 500 mg at 04/14/24 1305   [START ON 04/15/2024] thiamine (VITAMIN B1) tablet 100 mg  100 mg Oral Daily Tat, David, MD         Discharge Medications: TAKE these medications     acetaminophen  325 MG tablet Commonly known as: TYLENOL  Take 650 mg by mouth every 6 (six) hours as  needed for mild pain.    albuterol  108 (90 Base) MCG/ACT inhaler Commonly known as: VENTOLIN  HFA Inhale 2 puffs into the lungs every 4 (four) hours as needed for wheezing or shortness of breath.    amiodarone  200 MG tablet Commonly known as: PACERONE  Take 1 tablet (200 mg total) by mouth daily.    apixaban  5 MG Tabs tablet Commonly known as: ELIQUIS  Take 2 tablets (10 mg total) by mouth 2 (two) times daily for 6 days, THEN 1 tablet (5 mg total) 2 (two) times daily. Start taking on: April 14, 2024 What changed: See the new instructions.    digoxin  0.125 MG tablet Commonly known  as: LANOXIN  TAKE (1) TABLET BY MOUTH ONCE DAILY. What changed: See the new instructions.    ezetimibe  10 MG tablet Commonly known as: Zetia  Take 1 tablet (10 mg total) by mouth daily.    gabapentin  100 MG capsule Commonly known as: NEURONTIN  Take 100 mg by mouth 2 (two) times daily.    levothyroxine  100 MCG tablet Commonly known as: SYNTHROID  Take 1 tablet (100 mcg total) by mouth daily at 6 (six) AM.    lidocaine  5 % Commonly known as: LIDODERM  Place 1 patch onto the skin every 12 (twelve) hours. Leave off 12 hours    metFORMIN  500 MG tablet Commonly known as: GLUCOPHAGE  Take 500 mg by mouth daily with breakfast.    midodrine 5 MG tablet Commonly known as: PROAMATINE Take 1 tablet (5 mg total) by mouth 3 (three) times daily with meals.    nitroGLYCERIN  0.4 MG SL tablet Commonly known as: NITROSTAT  PLACE ONE (1) TABLET UNDER TONGUE EVERY 5 MINUTES UP TO (3) DOSES AS NEEDED FOR CHEST PAIN. IF NO RELIEF, CONTACT MD.    pantoprazole  40 MG tablet Commonly known as: PROTONIX  Take 40 mg by mouth daily.    potassium chloride  10 MEQ tablet Commonly known as: KLOR-CON  Take 2 tablets (20 mEq total) by mouth daily.    thiamine 100 MG tablet Commonly known as: Vitamin B-1 Take 1 tablet (100 mg total) by mouth daily. Start taking on: April 15, 2024    torsemide  20 MG tablet Commonly known as:  DEMADEX  TAKE (1) TABLET BY MOUTH ONCE DAILY. What changed: See the new instructions.      Relevant Imaging Results:  Relevant Lab Results:   Additional Information    Ander Katos, LCSW

## 2024-04-14 NOTE — Progress Notes (Signed)
 Progress Note  Patient Name: Daniel Li Date of Encounter: 04/14/2024  Primary Cardiologist: Armida Lander, MD  Subjective   Patient reported that his medications were mixed up by pharmacist and hence arrived to the hospital with multiple issues.  He talked to his POA last night and has been stressed since.  No symptoms otherwise.  Inpatient Medications    Scheduled Meds:  amiodarone   200 mg Oral Daily   apixaban   10 mg Oral BID   Followed by   Cecily Cohen ON 04/20/2024] apixaban   5 mg Oral BID   digoxin   0.125 mg Oral Daily   ezetimibe   10 mg Oral Daily   insulin  aspart  0-9 Units Subcutaneous TID WC   levothyroxine   100 mcg Oral Q0600   midodrine  5 mg Oral TID WC   pantoprazole   40 mg Oral Daily   Continuous Infusions:  thiamine (VITAMIN B1) injection 500 mg (04/14/24 0531)   PRN Meds: acetaminophen  **OR** acetaminophen , melatonin, ondansetron  **OR** ondansetron  (ZOFRAN ) IV   Vital Signs    Vitals:   04/13/24 0500 04/13/24 1141 04/13/24 2008 04/14/24 0454  BP:  (!) 89/63 90/63 90/67   Pulse:  (!) 57 (!) 54 63  Resp:      Temp:  98.6 F (37 C) 98.3 F (36.8 C) 97.8 F (36.6 C)  TempSrc:  Oral Oral Oral  SpO2:  97% 98% 98%  Weight: 66.4 kg   66.4 kg  Height:        Intake/Output Summary (Last 24 hours) at 04/14/2024 1109 Last data filed at 04/14/2024 1052 Gross per 24 hour  Intake 527.99 ml  Output 1600 ml  Net -1072.01 ml   Filed Weights   04/12/24 0500 04/13/24 0500 04/14/24 0454  Weight: 67.5 kg 66.4 kg 66.4 kg    Telemetry     Personally reviewed.  NSR.  ECG    Not performed today.  Physical Exam   GEN: No acute distress.   Neck: No JVD. Cardiac: RRR, no murmur, rub, or gallop.  Respiratory: Nonlabored. Clear to auscultation bilaterally. GI: Soft, nontender, bowel sounds present. MS: No edema; No deformity. Neuro:  Nonfocal. Psych: Alert and oriented x 3. Normal affect.  Labs    Chemistry Recent Labs  Lab 04/10/24 0223  04/12/24 0237 04/14/24 0519  NA 141 138 141  K 4.0 3.8 3.9  CL 104 104 106  CO2 27 24 27   GLUCOSE 161* 99 98  BUN 12 15 16   CREATININE 1.11 1.04 1.10  CALCIUM  8.7* 8.8* 9.0  PROT 6.6  --   --   ALBUMIN 3.4*  --   --   AST 29  --   --   ALT 19  --   --   ALKPHOS 66  --   --   BILITOT 0.5  --   --   GFRNONAA >60 >60 >60  ANIONGAP 10 10 8      Hematology Recent Labs  Lab 04/12/24 0237 04/13/24 0435 04/14/24 0519  WBC 5.4 5.9 6.7  RBC 5.17 5.15 4.97  HGB 13.2 12.9* 12.5*  HCT 42.4 41.8 40.1  MCV 82.0 81.2 80.7  MCH 25.5* 25.0* 25.2*  MCHC 31.1 30.9 31.2  RDW 16.8* 16.4* 16.4*  PLT 212 227 216    Cardiac Enzymes Recent Labs  Lab 04/09/24 1923 04/09/24 2114 04/10/24 0041 04/10/24 0223  TROPONINIHS 35* 36* 37* 37*    BNP Recent Labs  Lab 04/09/24 1923  BNP 414.0*     DDimer Recent  Labs  Lab 04/09/24 1923  DDIMER 1.13*     Radiology    No results found.   Assessment & Plan    Hypotension: New onset this admission in the setting of acute pulm embolism.  Patient also has been symptomatic with dizziness for a few days prior to admission.  Not on any GDMT except Jardiance  prior to admission, discontinued yesterday.  Continue midodrine 5 mg TID.  Encourage alcohol cessation.   Acute pulmonary embolism: New onset this admission.  Also had prior history of pulmonary embolism in 2023/24 and history of LV thrombus previously.  On room air, saturating well.  Continue Eliquis  10 mg twice daily (PE dosing).  Management per primary team.   Ischemic cardiomyopathy s/p [facility] Jude ICD: LVEF 20 to 25% since 2018.  Not on GDMT due to hypotension.  Will hold Jardiance .   Moderate to severe MR: TEER was deferred due to improvement in MR on subsequent echocardiogram.   CAD s/p multiple revascularizations: Has pleuritic chest pain but no angina.  Not on aspirin  due to Eliquis  use.  Not on statin due to rhabdomyolysis on statin therapy.  Supposed to be on Repatha  and  Zetia .   History of LV thrombus: Previously on Coumadin  that was discontinued on one of the [facility] admissions.   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   Alcohol use: EtOH level 68 on admission.  Has POA.  Probably has some cognitive impairment.  Management per primary.   [facility] will sign off.   Medication Recommendations: Continue current medications Other recommendations (labs, testing, etc): None Follow up as an outpatient: Outpatient heart failure follow-up in 2 weeks upon discharge   Signed, Avedis Bevis P Shanyia Stines, MD  04/14/2024, 11:09 AM

## 2024-04-14 NOTE — Care Management Important Message (Signed)
 Important Message  Patient Details  Name: Daniel Li MRN: [id number] Date of Birth: 1955/11/23   Important Message Given:  Yes - Medicare IM     Daniel Li 04/14/2024, 2:48 PM

## 2024-04-14 NOTE — Progress Notes (Signed)
 This RN spoke with POA Ms. Ivette Marks who voices concerns about current home that patient is residing in. She reports he was given the wrong medications there and doesn't get the care he needs. [place] RN

## 2024-04-15 DIAGNOSIS — I4729 Other ventricular tachycardia: Secondary | ICD-10-CM | POA: Diagnosis not present

## 2024-04-15 DIAGNOSIS — M25562 Pain in left knee: Secondary | ICD-10-CM | POA: Diagnosis not present

## 2024-04-15 DIAGNOSIS — E785 Hyperlipidemia, unspecified: Secondary | ICD-10-CM | POA: Diagnosis not present

## 2024-04-15 DIAGNOSIS — I69354 Hemiplegia and hemiparesis following cerebral infarction affecting left non-dominant side: Secondary | ICD-10-CM | POA: Diagnosis not present

## 2024-04-15 DIAGNOSIS — Z604 Social exclusion and rejection: Secondary | ICD-10-CM | POA: Diagnosis not present

## 2024-04-15 DIAGNOSIS — Z7984 Long term (current) use of oral hypoglycemic drugs: Secondary | ICD-10-CM | POA: Diagnosis not present

## 2024-04-15 DIAGNOSIS — I255 Ischemic cardiomyopathy: Secondary | ICD-10-CM | POA: Diagnosis not present

## 2024-04-15 DIAGNOSIS — K219 Gastro-esophageal reflux disease without esophagitis: Secondary | ICD-10-CM | POA: Diagnosis not present

## 2024-04-15 DIAGNOSIS — I6523 Occlusion and stenosis of bilateral carotid arteries: Secondary | ICD-10-CM | POA: Diagnosis not present

## 2024-04-15 DIAGNOSIS — I69392 Facial weakness following cerebral infarction: Secondary | ICD-10-CM | POA: Diagnosis not present

## 2024-04-15 DIAGNOSIS — I2510 Atherosclerotic heart disease of native coronary artery without angina pectoris: Secondary | ICD-10-CM | POA: Diagnosis not present

## 2024-04-15 DIAGNOSIS — M25561 Pain in right knee: Secondary | ICD-10-CM | POA: Diagnosis not present

## 2024-04-15 DIAGNOSIS — E039 Hypothyroidism, unspecified: Secondary | ICD-10-CM | POA: Diagnosis not present

## 2024-04-15 DIAGNOSIS — Z7901 Long term (current) use of anticoagulants: Secondary | ICD-10-CM | POA: Diagnosis not present

## 2024-04-15 DIAGNOSIS — F17210 Nicotine dependence, cigarettes, uncomplicated: Secondary | ICD-10-CM | POA: Diagnosis not present

## 2024-04-15 DIAGNOSIS — G8929 Other chronic pain: Secondary | ICD-10-CM | POA: Diagnosis not present

## 2024-04-15 DIAGNOSIS — Z556 Problems related to health literacy: Secondary | ICD-10-CM | POA: Diagnosis not present

## 2024-04-15 DIAGNOSIS — E119 Type 2 diabetes mellitus without complications: Secondary | ICD-10-CM | POA: Diagnosis not present

## 2024-04-15 DIAGNOSIS — D649 Anemia, unspecified: Secondary | ICD-10-CM | POA: Diagnosis not present

## 2024-04-15 DIAGNOSIS — I110 Hypertensive heart disease with heart failure: Secondary | ICD-10-CM | POA: Diagnosis not present

## 2024-04-15 DIAGNOSIS — N179 Acute kidney failure, unspecified: Secondary | ICD-10-CM | POA: Diagnosis not present

## 2024-04-15 DIAGNOSIS — I5022 Chronic systolic (congestive) heart failure: Secondary | ICD-10-CM | POA: Diagnosis not present

## 2024-04-21 DIAGNOSIS — I110 Hypertensive heart disease with heart failure: Secondary | ICD-10-CM | POA: Diagnosis not present

## 2024-04-21 DIAGNOSIS — I6523 Occlusion and stenosis of bilateral carotid arteries: Secondary | ICD-10-CM | POA: Diagnosis not present

## 2024-04-21 DIAGNOSIS — K219 Gastro-esophageal reflux disease without esophagitis: Secondary | ICD-10-CM | POA: Diagnosis not present

## 2024-04-21 DIAGNOSIS — M25562 Pain in left knee: Secondary | ICD-10-CM | POA: Diagnosis not present

## 2024-04-21 DIAGNOSIS — I4729 Other ventricular tachycardia: Secondary | ICD-10-CM | POA: Diagnosis not present

## 2024-04-21 DIAGNOSIS — I2510 Atherosclerotic heart disease of native coronary artery without angina pectoris: Secondary | ICD-10-CM | POA: Diagnosis not present

## 2024-04-21 DIAGNOSIS — D649 Anemia, unspecified: Secondary | ICD-10-CM | POA: Diagnosis not present

## 2024-04-21 DIAGNOSIS — Z556 Problems related to health literacy: Secondary | ICD-10-CM | POA: Diagnosis not present

## 2024-04-21 DIAGNOSIS — I5022 Chronic systolic (congestive) heart failure: Secondary | ICD-10-CM | POA: Diagnosis not present

## 2024-04-21 DIAGNOSIS — I69392 Facial weakness following cerebral infarction: Secondary | ICD-10-CM | POA: Diagnosis not present

## 2024-04-21 DIAGNOSIS — E785 Hyperlipidemia, unspecified: Secondary | ICD-10-CM | POA: Diagnosis not present

## 2024-04-21 DIAGNOSIS — I69354 Hemiplegia and hemiparesis following cerebral infarction affecting left non-dominant side: Secondary | ICD-10-CM | POA: Diagnosis not present

## 2024-04-21 DIAGNOSIS — E039 Hypothyroidism, unspecified: Secondary | ICD-10-CM | POA: Diagnosis not present

## 2024-04-21 DIAGNOSIS — F17210 Nicotine dependence, cigarettes, uncomplicated: Secondary | ICD-10-CM | POA: Diagnosis not present

## 2024-04-21 DIAGNOSIS — Z7984 Long term (current) use of oral hypoglycemic drugs: Secondary | ICD-10-CM | POA: Diagnosis not present

## 2024-04-21 DIAGNOSIS — Z604 Social exclusion and rejection: Secondary | ICD-10-CM | POA: Diagnosis not present

## 2024-04-21 DIAGNOSIS — Z7901 Long term (current) use of anticoagulants: Secondary | ICD-10-CM | POA: Diagnosis not present

## 2024-04-21 DIAGNOSIS — N179 Acute kidney failure, unspecified: Secondary | ICD-10-CM | POA: Diagnosis not present

## 2024-04-21 DIAGNOSIS — I255 Ischemic cardiomyopathy: Secondary | ICD-10-CM | POA: Diagnosis not present

## 2024-04-21 DIAGNOSIS — G8929 Other chronic pain: Secondary | ICD-10-CM | POA: Diagnosis not present

## 2024-04-21 DIAGNOSIS — E119 Type 2 diabetes mellitus without complications: Secondary | ICD-10-CM | POA: Diagnosis not present

## 2024-04-21 DIAGNOSIS — M25561 Pain in right knee: Secondary | ICD-10-CM | POA: Diagnosis not present

## 2024-04-22 ENCOUNTER — Telehealth (HOSPITAL_COMMUNITY): Payer: Self-pay

## 2024-04-22 NOTE — Telephone Encounter (Signed)
 Called and spoke to pt's caregiver Jenkins to confirm/remind patient of their appointment [facility] on [facility]. However, pt needed to reschedule appointment to a later date.  Patient's new appointment has been scheduled.

## 2024-04-23 ENCOUNTER — Encounter (HOSPITAL_COMMUNITY)

## 2024-04-29 DIAGNOSIS — G8929 Other chronic pain: Secondary | ICD-10-CM | POA: Diagnosis not present

## 2024-04-29 DIAGNOSIS — M25562 Pain in left knee: Secondary | ICD-10-CM | POA: Diagnosis not present

## 2024-04-29 DIAGNOSIS — D649 Anemia, unspecified: Secondary | ICD-10-CM | POA: Diagnosis not present

## 2024-04-29 DIAGNOSIS — N179 Acute kidney failure, unspecified: Secondary | ICD-10-CM | POA: Diagnosis not present

## 2024-04-29 DIAGNOSIS — I2510 Atherosclerotic heart disease of native coronary artery without angina pectoris: Secondary | ICD-10-CM | POA: Diagnosis not present

## 2024-04-29 DIAGNOSIS — Z7901 Long term (current) use of anticoagulants: Secondary | ICD-10-CM | POA: Diagnosis not present

## 2024-04-29 DIAGNOSIS — E785 Hyperlipidemia, unspecified: Secondary | ICD-10-CM | POA: Diagnosis not present

## 2024-04-29 DIAGNOSIS — I5022 Chronic systolic (congestive) heart failure: Secondary | ICD-10-CM | POA: Diagnosis not present

## 2024-04-29 DIAGNOSIS — K219 Gastro-esophageal reflux disease without esophagitis: Secondary | ICD-10-CM | POA: Diagnosis not present

## 2024-04-29 DIAGNOSIS — I110 Hypertensive heart disease with heart failure: Secondary | ICD-10-CM | POA: Diagnosis not present

## 2024-04-29 DIAGNOSIS — Z556 Problems related to health literacy: Secondary | ICD-10-CM | POA: Diagnosis not present

## 2024-04-29 DIAGNOSIS — F17210 Nicotine dependence, cigarettes, uncomplicated: Secondary | ICD-10-CM | POA: Diagnosis not present

## 2024-04-29 DIAGNOSIS — I255 Ischemic cardiomyopathy: Secondary | ICD-10-CM | POA: Diagnosis not present

## 2024-04-29 DIAGNOSIS — I69392 Facial weakness following cerebral infarction: Secondary | ICD-10-CM | POA: Diagnosis not present

## 2024-04-29 DIAGNOSIS — E119 Type 2 diabetes mellitus without complications: Secondary | ICD-10-CM | POA: Diagnosis not present

## 2024-04-29 DIAGNOSIS — M25561 Pain in right knee: Secondary | ICD-10-CM | POA: Diagnosis not present

## 2024-04-29 DIAGNOSIS — I4729 Other ventricular tachycardia: Secondary | ICD-10-CM | POA: Diagnosis not present

## 2024-04-29 DIAGNOSIS — Z604 Social exclusion and rejection: Secondary | ICD-10-CM | POA: Diagnosis not present

## 2024-04-29 DIAGNOSIS — E039 Hypothyroidism, unspecified: Secondary | ICD-10-CM | POA: Diagnosis not present

## 2024-04-29 DIAGNOSIS — I6523 Occlusion and stenosis of bilateral carotid arteries: Secondary | ICD-10-CM | POA: Diagnosis not present

## 2024-04-29 DIAGNOSIS — I69354 Hemiplegia and hemiparesis following cerebral infarction affecting left non-dominant side: Secondary | ICD-10-CM | POA: Diagnosis not present

## 2024-04-29 DIAGNOSIS — Z7984 Long term (current) use of oral hypoglycemic drugs: Secondary | ICD-10-CM | POA: Diagnosis not present

## 2024-05-04 DIAGNOSIS — R5383 Other fatigue: Secondary | ICD-10-CM | POA: Diagnosis not present

## 2024-05-04 DIAGNOSIS — Z0000 Encounter for general adult medical examination without abnormal findings: Secondary | ICD-10-CM | POA: Diagnosis not present

## 2024-05-04 DIAGNOSIS — E1165 Type 2 diabetes mellitus with hyperglycemia: Secondary | ICD-10-CM | POA: Diagnosis not present

## 2024-05-04 DIAGNOSIS — I10 Essential (primary) hypertension: Secondary | ICD-10-CM | POA: Diagnosis not present

## 2024-05-04 DIAGNOSIS — Z299 Encounter for prophylactic measures, unspecified: Secondary | ICD-10-CM | POA: Diagnosis not present

## 2024-05-04 DIAGNOSIS — I2699 Other pulmonary embolism without acute cor pulmonale: Secondary | ICD-10-CM | POA: Diagnosis not present

## 2024-05-04 DIAGNOSIS — E7800 Pure hypercholesterolemia, unspecified: Secondary | ICD-10-CM | POA: Diagnosis not present

## 2024-05-07 DIAGNOSIS — I4729 Other ventricular tachycardia: Secondary | ICD-10-CM | POA: Diagnosis not present

## 2024-05-07 DIAGNOSIS — M25562 Pain in left knee: Secondary | ICD-10-CM | POA: Diagnosis not present

## 2024-05-07 DIAGNOSIS — I2510 Atherosclerotic heart disease of native coronary artery without angina pectoris: Secondary | ICD-10-CM | POA: Diagnosis not present

## 2024-05-07 DIAGNOSIS — G8929 Other chronic pain: Secondary | ICD-10-CM | POA: Diagnosis not present

## 2024-05-07 DIAGNOSIS — K219 Gastro-esophageal reflux disease without esophagitis: Secondary | ICD-10-CM | POA: Diagnosis not present

## 2024-05-07 DIAGNOSIS — I5022 Chronic systolic (congestive) heart failure: Secondary | ICD-10-CM | POA: Diagnosis not present

## 2024-05-07 DIAGNOSIS — Z604 Social exclusion and rejection: Secondary | ICD-10-CM | POA: Diagnosis not present

## 2024-05-07 DIAGNOSIS — N179 Acute kidney failure, unspecified: Secondary | ICD-10-CM | POA: Diagnosis not present

## 2024-05-07 DIAGNOSIS — F17210 Nicotine dependence, cigarettes, uncomplicated: Secondary | ICD-10-CM | POA: Diagnosis not present

## 2024-05-07 DIAGNOSIS — Z7984 Long term (current) use of oral hypoglycemic drugs: Secondary | ICD-10-CM | POA: Diagnosis not present

## 2024-05-07 DIAGNOSIS — Z556 Problems related to health literacy: Secondary | ICD-10-CM | POA: Diagnosis not present

## 2024-05-07 DIAGNOSIS — D649 Anemia, unspecified: Secondary | ICD-10-CM | POA: Diagnosis not present

## 2024-05-07 DIAGNOSIS — E785 Hyperlipidemia, unspecified: Secondary | ICD-10-CM | POA: Diagnosis not present

## 2024-05-07 DIAGNOSIS — Z7901 Long term (current) use of anticoagulants: Secondary | ICD-10-CM | POA: Diagnosis not present

## 2024-05-07 DIAGNOSIS — E039 Hypothyroidism, unspecified: Secondary | ICD-10-CM | POA: Diagnosis not present

## 2024-05-07 DIAGNOSIS — I69392 Facial weakness following cerebral infarction: Secondary | ICD-10-CM | POA: Diagnosis not present

## 2024-05-07 DIAGNOSIS — M25561 Pain in right knee: Secondary | ICD-10-CM | POA: Diagnosis not present

## 2024-05-07 DIAGNOSIS — I255 Ischemic cardiomyopathy: Secondary | ICD-10-CM | POA: Diagnosis not present

## 2024-05-07 DIAGNOSIS — I6523 Occlusion and stenosis of bilateral carotid arteries: Secondary | ICD-10-CM | POA: Diagnosis not present

## 2024-05-07 DIAGNOSIS — I69354 Hemiplegia and hemiparesis following cerebral infarction affecting left non-dominant side: Secondary | ICD-10-CM | POA: Diagnosis not present

## 2024-05-07 DIAGNOSIS — I110 Hypertensive heart disease with heart failure: Secondary | ICD-10-CM | POA: Diagnosis not present

## 2024-05-07 DIAGNOSIS — E119 Type 2 diabetes mellitus without complications: Secondary | ICD-10-CM | POA: Diagnosis not present

## 2024-05-08 NOTE — Progress Notes (Signed)
 ADVANCED HF CLINIC NOTE  Vyas, Leta NOVAK, MD  HF : Ezra Shuck, MD  Chief Complaint: Heart Failure   HPI: Daniel Li is a 68 y.o. male with history of CAD, chronic systolic CHF/ischemic cardiomyopathy s/p St. Jude ICD in 2018, HTN, HLD, tobacco abuse, hx ETOH abuse, mitral regurgitation, OSA, hx CVA, hx PE 05/24, DM.  He had NSTEMI in 2007 s/p BMS to D1 and OM2. Later had STEMI in 2013 s/p BMS to RCA. Had NSTEMI in 2014 and found to have CTO of LCX treated medically. He had anterior STEMI in 04/18 s/p DES to LAD. Had DES to mLAD 10/19 in setting of NSTEMI. Last LHC at [facility] in 2023: patent stents in p LAD and D1, moderate diffuse disease mLAD, LCX occluded proximally and fills via collaterals from LAD, patent pRCA stent, 50% PLV, 50% PDA. CAD treated medically.   EF has been in the 20-25% range by echo since 2018.   Presented for mTEER 10/24, however found no significant MR on TEE, despite multiple TTEs suggestive otherwise. Procedure aborted and he was discharged.   Admitted 03/12/24 with left arm weakness, dizziness, and near syncope. MRI and CT head without acute abnormalities. L arm weakness form previous CVA. At discharged jardiance , entresto , torsemide , and spironolactone  held. Discharge back Assisted Living. Saw PCP and jardiance  was restarted.   Overall feeling fine. Gets short of breath with exertion. Denies PND/Orthopnea. Appetite has been poor. No fever or chills. Weight at ALF has been stable. Taking all medications at Assisted Living. No recent drug use.   ROS: All systems negative except as listed in HPI, PMH and Problem List.  SH:  Social History   Socioeconomic History   Marital status: Single    Spouse name: Not on file   Number of children: 0   Years of education: 9   Highest education level: 9th grade  Occupational History   Not on file  Tobacco Use   Smoking status: Former    Current packs/day: 0.50    Average packs/day: 0.5 packs/day for 46.8  years (23.4 ttl pk-yrs)    Types: Cigarettes, Cigars    Start date: 07/21/1977   Smokeless tobacco: Never  Vaping Use   Vaping status: Never Used  Substance and Sexual Activity   Alcohol use: Yes    Comment: 09/17/2017 nothing since 05/2017   Drug use: Not Currently    Comment: previously incarcerated for drug related offense.   Sexual activity: Not Currently  Other Topics Concern   Not on file  Social History Narrative   Not on file   Social Drivers of Health   Financial Resource Strain: Low Risk  (02/25/2023)   Received from [facility]   Overall Financial Resource Strain (CARDIA)    Difficulty of Paying Living Expenses: Not hard at all  Food Insecurity: No Food Insecurity (04/09/2024)   Hunger Vital Sign    Worried About Running Out of Food in the Last Year: Never true    Ran Out of Food in the Last Year: Never true  Transportation Needs: No Transportation Needs (04/09/2024)   PRAPARE - Administrator, Civil Service (Medical): No    Lack of Transportation (Non-Medical): No  Recent Concern: Transportation Needs - Unmet Transportation Needs (01/22/2024)   PRAPARE - Administrator, Civil Service (Medical): Yes    Lack of Transportation (Non-Medical): Yes  Physical Activity: Not on file  Stress: Not on file  Social Connections: Moderately Isolated (04/09/2024)   Social Connection and Isolation Panel    Frequency of Communication with Friends and Family: More than three times a week    Frequency of Social Gatherings with Friends and Family: More than three times a week    Attends Religious Services: 1 to 4 times per year    Active Member of [employer] or Organizations: No    Attends Banker Meetings: Never    Marital Status: Never married  Intimate Partner Violence: Not At Risk (04/09/2024)   Humiliation, Afraid, Rape, and Kick questionnaire    Fear of Current or Ex-Partner: No    Emotionally Abused: No    Physically Abused: No    Sexually  Abused: No    FH:  Family History  Problem Relation Age of Onset   Stroke Mother    Heart attack Mother    Heart attack Father    Stroke Sister    Heart attack Sister    Heart attack Brother    Stroke Brother    Liver disease Neg Hx    Colon cancer Neg Hx     Past Medical History:  Diagnosis Date   AICD (automatic cardioverter/defibrillator) present    Anxiety    Arthritis    all over (09/17/2017)   Burn    CAD (coronary artery disease)    a. NSTEMI s/p BMS to 1st Diagonal and distal OM2 in 2007; b. STEMI 03/26/12 s/p BMS to RCA; c. NSTEMI 10/2012 : CTO of LCx (unable to open with PCI) and PL branch, mod dz of LAD and diagonal, and preserved LV systolic fxn, Med Rx;  d.  anterior STEMI in 01/2017 with DES to Proximal LAD   CAD in native artery 08/23/2020   Cardiomyopathy EF 35% on cath 06/30/14, new from jan 2015 07/20/2014   Chest pain    CHF (congestive heart failure) (HCC)    Chronic back pain    all over (09/17/2017)   DDD (degenerative disc disease), cervical    Depression    GERD (gastroesophageal reflux disease)    Headache    a few/wk (09/17/2017)   HTN (hypertension)    Hypercholesterolemia    Mental disorder    Myocardial infarction (HCC)    I've had 7 (09/17/2017)   Pulmonary edema    Respiratory failure (HCC)    Sciatic pain    Sleep apnea    Stroke [facility])    a. multiple dating back to 2002; *I''ve had 5; LUE/LLE weaker since (09/17/2017)   Tibia fracture (l) leg   Tobacco abuse    Type 2 diabetes mellitus (HCC)    Unstable angina (HCC)     Current Outpatient Medications  Medication Sig Dispense Refill   acetaminophen  (TYLENOL ) 325 MG tablet Take 650 mg by mouth every 6 (six) hours as needed for mild pain.     albuterol  (VENTOLIN  HFA) 108 (90 Base) MCG/ACT inhaler Inhale 2 puffs into the lungs every 4 (four) hours as needed for wheezing or shortness of breath. 1 each 3   amiodarone  (PACERONE ) 200 MG tablet Take 1 tablet (200 mg total) by mouth  daily. 90 tablet 3   apixaban  (ELIQUIS ) 5 MG TABS tablet Take 2 tablets (10 mg total) by mouth 2 (two) times daily for 6 days, THEN 1 tablet (5 mg total) 2 (two) times daily. 60 tablet 2   digoxin  (LANOXIN ) 0.125 MG tablet TAKE (1) TABLET BY MOUTH ONCE DAILY. (Patient taking differently: Take 0.125 mg by  mouth daily.) 30 tablet 11   ezetimibe  (ZETIA ) 10 MG tablet Take 1 tablet (10 mg total) by mouth daily. 30 tablet 3   gabapentin  (NEURONTIN ) 100 MG capsule Take 100 mg by mouth 2 (two) times daily.     levothyroxine  (SYNTHROID ) 100 MCG tablet Take 1 tablet (100 mcg total) by mouth daily at 6 (six) AM. 30 tablet 0   lidocaine  (LIDODERM ) 5 % Place 1 patch onto the skin every 12 (twelve) hours. Leave off 12 hours     metFORMIN  (GLUCOPHAGE ) 500 MG tablet Take 500 mg by mouth daily with breakfast.     midodrine  (PROAMATINE ) 5 MG tablet Take 1 tablet (5 mg total) by mouth 3 (three) times daily with meals. 90 tablet 1   nitroGLYCERIN  (NITROSTAT ) 0.4 MG SL tablet PLACE ONE (1) TABLET UNDER TONGUE EVERY 5 MINUTES UP TO (3) DOSES AS NEEDED FOR CHEST PAIN. IF NO RELIEF, CONTACT MD. 25 tablet 3   pantoprazole  (PROTONIX ) 40 MG tablet Take 40 mg by mouth daily.     potassium chloride  (KLOR-CON ) 10 MEQ tablet Take 2 tablets (20 mEq total) by mouth daily. 60 tablet 11   thiamine  (VITAMIN B-1) 100 MG tablet Take 1 tablet (100 mg total) by mouth daily. 30 tablet 2   torsemide  (DEMADEX ) 20 MG tablet TAKE (1) TABLET BY MOUTH ONCE DAILY. (Patient taking differently: Take 20 mg by mouth daily.) 30 tablet 11   No current facility-administered medications for this visit.    There were no vitals filed for this visit.    PHYSICAL EXAM: General:   No resp difficulty Neck: supple. JVP 8-9 Cor: PMI nondisplaced. Regular rate & rhythm. No rubs, gallops or murmurs. Lungs: crackles in the bases on room air.  Abdomen: soft, nontender, nondistended.  Extremities: no cyanosis, clubbing, rash, edema Neuro: alert & oriented  x3   ASSESSMENT & PLAN:  Chronic systolic CHF:  - D/t ischemic cardiomyopathy - EF has been in 20-25% range since 2018 -TEE 10/24: EF 20-2%, RV okay, severe LAE, mild MR - S/p St. Jude ICD.  - NYHA III. ON exam he does have some fluid building up with crackles in the bases. Restart torsemide  20 mg daily  - Hold off on entresto  and spiro - Continue  Jardiance  10 mg daily  - Continue Digoxin  0.125 mg daily   2. CAD: - Extensive CAD history with multiple prior Mis and stents - Last cath in 2023 with patent stents, occluded LCX and moderate diffuse disease elsewhere treated medically - No chest pain.  - no ASA. Continue eliquis .   - No statin given history of rhabdomyolysis while on statin therapy  3. Hx LV thrombus -No thrombus noted on most recent echocardiograms -Had been on Warfarin which was stopped during prior admit to [facility].  -Continue eliquis  5 mg twice a day   4. H/o NSVT - Continue amio 200 mg daily. TSH abnormal recently. Will need to check thyroid  panel next visit.   Follow up in 2 weeks.  Harlene HERO Corinthia Helmers NP-C  4:28 PM

## 2024-05-11 ENCOUNTER — Telehealth (HOSPITAL_COMMUNITY): Payer: Self-pay | Admitting: *Deleted

## 2024-05-11 ENCOUNTER — Ambulatory Visit: Payer: 59

## 2024-05-11 ENCOUNTER — Ambulatory Visit (INDEPENDENT_AMBULATORY_CARE_PROVIDER_SITE_OTHER): Payer: Medicare Other

## 2024-05-11 DIAGNOSIS — I5022 Chronic systolic (congestive) heart failure: Secondary | ICD-10-CM | POA: Diagnosis not present

## 2024-05-11 LAB — CUP PACEART REMOTE DEVICE CHECK
Battery Remaining Longevity: 46 mo
Battery Remaining Percentage: 43 %
Battery Voltage: 2.95 V
Brady Statistic RV Percent Paced: 1 %
Date Time Interrogation Session: 20250714020028
HighPow Impedance: 65 Ohm
HighPow Impedance: 65 Ohm
Implantable Lead Connection Status: 753985
Implantable Lead Implant Date: 20181120
Implantable Lead Location: 753860
Implantable Pulse Generator Implant Date: 20181120
Lead Channel Impedance Value: 350 Ohm
Lead Channel Pacing Threshold Amplitude: 1.25 V
Lead Channel Pacing Threshold Pulse Width: 0.5 ms
Lead Channel Sensing Intrinsic Amplitude: 11.9 mV
Lead Channel Setting Pacing Amplitude: 2.5 V
Lead Channel Setting Pacing Pulse Width: 0.5 ms
Lead Channel Setting Sensing Sensitivity: 0.5 mV
Pulse Gen Serial Number: 9786940
Zone Setting Status: 755011

## 2024-05-11 NOTE — Telephone Encounter (Signed)
 Called to confirm/remind patient of their appointment at the [facility] on 05/12/24.      Appointment:              [] Confirmed             [x] Left mess              [] No answer/No voice mail             [] Phone not in service   Patient reminded to bring all medications and/or complete list.   Confirmed patient has transportation. Gave directions, instructed to utilize valet parking.

## 2024-05-12 ENCOUNTER — Ambulatory Visit (HOSPITAL_COMMUNITY)
Admission: RE | Admit: 2024-05-12 | Discharge: 2024-05-12 | Disposition: A | Discharge status: Home / Self Care | Source: Ambulatory Visit | Attending: Family Medicine | Admitting: Family Medicine

## 2024-05-12 ENCOUNTER — Encounter (HOSPITAL_COMMUNITY): Payer: Self-pay

## 2024-05-12 VITALS — BP 110/78 | HR 65 | Ht 66.0 in | Wt 147.4 lb

## 2024-05-12 DIAGNOSIS — F1411 Cocaine abuse, in remission: Secondary | ICD-10-CM | POA: Diagnosis not present

## 2024-05-12 DIAGNOSIS — Z7984 Long term (current) use of oral hypoglycemic drugs: Secondary | ICD-10-CM | POA: Insufficient documentation

## 2024-05-12 DIAGNOSIS — I5022 Chronic systolic (congestive) heart failure: Secondary | ICD-10-CM | POA: Insufficient documentation

## 2024-05-12 DIAGNOSIS — I252 Old myocardial infarction: Secondary | ICD-10-CM | POA: Diagnosis not present

## 2024-05-12 DIAGNOSIS — Z95810 Presence of automatic (implantable) cardiac defibrillator: Secondary | ICD-10-CM | POA: Diagnosis not present

## 2024-05-12 DIAGNOSIS — Z86711 Personal history of pulmonary embolism: Secondary | ICD-10-CM | POA: Insufficient documentation

## 2024-05-12 DIAGNOSIS — I340 Nonrheumatic mitral (valve) insufficiency: Secondary | ICD-10-CM | POA: Insufficient documentation

## 2024-05-12 DIAGNOSIS — I4729 Other ventricular tachycardia: Secondary | ICD-10-CM | POA: Diagnosis not present

## 2024-05-12 DIAGNOSIS — F1011 Alcohol abuse, in remission: Secondary | ICD-10-CM

## 2024-05-12 DIAGNOSIS — I2510 Atherosclerotic heart disease of native coronary artery without angina pectoris: Secondary | ICD-10-CM | POA: Diagnosis not present

## 2024-05-12 DIAGNOSIS — Z7901 Long term (current) use of anticoagulants: Secondary | ICD-10-CM | POA: Insufficient documentation

## 2024-05-12 DIAGNOSIS — I255 Ischemic cardiomyopathy: Secondary | ICD-10-CM | POA: Diagnosis not present

## 2024-05-12 DIAGNOSIS — I110 Hypertensive heart disease with heart failure: Secondary | ICD-10-CM | POA: Diagnosis not present

## 2024-05-12 DIAGNOSIS — E119 Type 2 diabetes mellitus without complications: Secondary | ICD-10-CM | POA: Diagnosis not present

## 2024-05-12 DIAGNOSIS — G4733 Obstructive sleep apnea (adult) (pediatric): Secondary | ICD-10-CM | POA: Diagnosis not present

## 2024-05-12 DIAGNOSIS — F1410 Cocaine abuse, uncomplicated: Secondary | ICD-10-CM

## 2024-05-12 DIAGNOSIS — Z8673 Personal history of transient ischemic attack (TIA), and cerebral infarction without residual deficits: Secondary | ICD-10-CM | POA: Insufficient documentation

## 2024-05-12 DIAGNOSIS — Z79899 Other long term (current) drug therapy: Secondary | ICD-10-CM | POA: Insufficient documentation

## 2024-05-12 DIAGNOSIS — I472 Ventricular tachycardia, unspecified: Secondary | ICD-10-CM | POA: Diagnosis not present

## 2024-05-12 DIAGNOSIS — I513 Intracardiac thrombosis, not elsewhere classified: Secondary | ICD-10-CM | POA: Diagnosis not present

## 2024-05-12 LAB — BASIC METABOLIC PANEL WITH GFR
Anion gap: 12 (ref 5–15)
BUN: 17 mg/dL (ref 8–23)
CO2: 27 mmol/L (ref 22–32)
Calcium: 9 mg/dL (ref 8.9–10.3)
Chloride: 99 mmol/L (ref 98–111)
Creatinine, Ser: 1.36 mg/dL — ABNORMAL HIGH (ref 0.61–1.24)
GFR, Estimated: 57 mL/min — ABNORMAL LOW (ref >60)
Glucose, Bld: 138 mg/dL — ABNORMAL HIGH (ref 70–99)
Potassium: 4 mmol/L (ref 3.5–5.1)
Sodium: 138 mmol/L (ref 135–145)

## 2024-05-12 LAB — CBC
HCT: 45 % (ref 39.0–52.0)
Hemoglobin: 14.3 g/dL (ref 13.0–17.0)
MCH: 25.1 pg — ABNORMAL LOW (ref 26.0–34.0)
MCHC: 31.8 g/dL (ref 30.0–36.0)
MCV: 79.1 fL — ABNORMAL LOW (ref 80.0–100.0)
Platelets: 250 K/uL (ref 150–400)
RBC: 5.69 MIL/uL (ref 4.22–5.81)
RDW: 16.9 % — ABNORMAL HIGH (ref 11.5–15.5)
WBC: 4.7 K/uL (ref 4.0–10.5)
nRBC: 0 % (ref 0.0–0.2)

## 2024-05-12 LAB — BRAIN NATRIURETIC PEPTIDE: B Natriuretic Peptide: 206.8 pg/mL — ABNORMAL HIGH (ref 0.0–100.0)

## 2024-05-12 LAB — T4, FREE: Free T4: 1.2 ng/dL — ABNORMAL HIGH (ref 0.61–1.12)

## 2024-05-12 LAB — DIGOXIN LEVEL: Digoxin Level: 0.8 ng/mL (ref 0.8–2.0)

## 2024-05-12 LAB — TSH: TSH: 0.597 u[IU]/mL (ref 0.350–4.500)

## 2024-05-12 NOTE — Patient Instructions (Signed)
 Lab Work:  Labs done today, your results will be available in MyChart, we will contact you for abnormal readings.   Follow-Up in: 3 months   At the [facility], you and your health needs are our priority. We have a designated team specialized in the treatment of Heart Failure. This Care Team includes your primary Heart Failure Specialized Cardiologist (physician), Advanced Practice Providers (APPs- Physician Assistants and Nurse Practitioners), and Pharmacist who all work together to provide you with the care you need, when you need it.   You may see any of the following providers on your designated Care Team at your next follow up:  Dr. Toribio Fuel Dr. Ezra Shuck Dr. Ria Commander Dr. Odis Brownie Greig Mosses, NP Caffie Shed, [place] [facility] [place], [place] Beckey Coe, NP [place] Lee, NP Tinnie Redman, PharmD   Please be sure to bring in all your medications bottles to every appointment.   Need to Contact Us :  If you have any questions or concerns before your next appointment please send us  a message through [place] or call our office at [phone].    TO LEAVE A MESSAGE FOR THE NURSE SELECT OPTION 2, PLEASE LEAVE A MESSAGE INCLUDING: YOUR NAME DATE OF BIRTH CALL BACK NUMBER REASON FOR CALL**this is important as we prioritize the call backs  YOU WILL RECEIVE A CALL BACK THE SAME DAY AS LONG AS YOU CALL BEFORE 4:00 PM

## 2024-05-12 NOTE — Progress Notes (Signed)
 12-lead EKG performed.  Critical value noted.  [name], FNP, notified.

## 2024-05-12 NOTE — Addendum Note (Signed)
 Encounter addended by: Florene Velia SAILOR on: 05/12/2024 5:18 PM  Actions taken: Clinical Note Signed

## 2024-05-13 ENCOUNTER — Ambulatory Visit (HOSPITAL_COMMUNITY): Payer: Self-pay | Admitting: Family Medicine

## 2024-05-13 ENCOUNTER — Ambulatory Visit: Payer: Self-pay | Admitting: Cardiovascular Disease

## 2024-05-13 DIAGNOSIS — G8929 Other chronic pain: Secondary | ICD-10-CM | POA: Diagnosis not present

## 2024-05-13 DIAGNOSIS — Z604 Social exclusion and rejection: Secondary | ICD-10-CM | POA: Diagnosis not present

## 2024-05-13 DIAGNOSIS — Z7984 Long term (current) use of oral hypoglycemic drugs: Secondary | ICD-10-CM | POA: Diagnosis not present

## 2024-05-13 DIAGNOSIS — I6523 Occlusion and stenosis of bilateral carotid arteries: Secondary | ICD-10-CM | POA: Diagnosis not present

## 2024-05-13 DIAGNOSIS — Z7901 Long term (current) use of anticoagulants: Secondary | ICD-10-CM | POA: Diagnosis not present

## 2024-05-13 DIAGNOSIS — N179 Acute kidney failure, unspecified: Secondary | ICD-10-CM | POA: Diagnosis not present

## 2024-05-13 DIAGNOSIS — I2510 Atherosclerotic heart disease of native coronary artery without angina pectoris: Secondary | ICD-10-CM | POA: Diagnosis not present

## 2024-05-13 DIAGNOSIS — K219 Gastro-esophageal reflux disease without esophagitis: Secondary | ICD-10-CM | POA: Diagnosis not present

## 2024-05-13 DIAGNOSIS — E119 Type 2 diabetes mellitus without complications: Secondary | ICD-10-CM | POA: Diagnosis not present

## 2024-05-13 DIAGNOSIS — E039 Hypothyroidism, unspecified: Secondary | ICD-10-CM | POA: Diagnosis not present

## 2024-05-13 DIAGNOSIS — I69392 Facial weakness following cerebral infarction: Secondary | ICD-10-CM | POA: Diagnosis not present

## 2024-05-13 DIAGNOSIS — I5022 Chronic systolic (congestive) heart failure: Secondary | ICD-10-CM | POA: Diagnosis not present

## 2024-05-13 DIAGNOSIS — F17210 Nicotine dependence, cigarettes, uncomplicated: Secondary | ICD-10-CM | POA: Diagnosis not present

## 2024-05-13 DIAGNOSIS — Z556 Problems related to health literacy: Secondary | ICD-10-CM | POA: Diagnosis not present

## 2024-05-13 DIAGNOSIS — D649 Anemia, unspecified: Secondary | ICD-10-CM | POA: Diagnosis not present

## 2024-05-13 DIAGNOSIS — I110 Hypertensive heart disease with heart failure: Secondary | ICD-10-CM | POA: Diagnosis not present

## 2024-05-13 DIAGNOSIS — M25561 Pain in right knee: Secondary | ICD-10-CM | POA: Diagnosis not present

## 2024-05-13 DIAGNOSIS — I4729 Other ventricular tachycardia: Secondary | ICD-10-CM | POA: Diagnosis not present

## 2024-05-13 DIAGNOSIS — M25562 Pain in left knee: Secondary | ICD-10-CM | POA: Diagnosis not present

## 2024-05-13 DIAGNOSIS — I255 Ischemic cardiomyopathy: Secondary | ICD-10-CM | POA: Diagnosis not present

## 2024-05-13 DIAGNOSIS — E785 Hyperlipidemia, unspecified: Secondary | ICD-10-CM | POA: Diagnosis not present

## 2024-05-13 DIAGNOSIS — I69354 Hemiplegia and hemiparesis following cerebral infarction affecting left non-dominant side: Secondary | ICD-10-CM | POA: Diagnosis not present

## 2024-05-18 ENCOUNTER — Encounter: Attending: Cardiovascular Disease

## 2024-05-18 DIAGNOSIS — Z95810 Presence of automatic (implantable) cardiac defibrillator: Secondary | ICD-10-CM | POA: Diagnosis not present

## 2024-05-18 DIAGNOSIS — I5022 Chronic systolic (congestive) heart failure: Secondary | ICD-10-CM | POA: Insufficient documentation

## 2024-05-20 NOTE — Progress Notes (Signed)
 EPIC Encounter for ICM Monitoring  Patient Name: Daniel Li is a 68 y.o. male Date: 05/20/2024 Primary Care Physican: Rosamond Leta NOVAK, MD Primary Cardiologist: [facility] Electrophysiologist: Mealor 08/10/2022 Office Weight: 140 lbs                11/05/2023 Weight: 150 lbs   03/12/2024 Office Weight: 149 lbs    05/12/2024 Office Weight: 147 lbs                              Transmission results reviewed.    CorVue thoracic impedance suggesting normal fluid levels since 7/9.      Prescribed dosage:  Torsemide  20 mg Take 1 tablet(s) (20 mg total) by mouth daily Potassium 10 mEq take 2 tablets (20 mEq total) by mouth daily Spironolactone  25 mg take 1 tablet (25 mg total) daily   Labs: 05/12/2024 Creatinine 1.36, BUN 17, Potassium 4.0, Sodium 138, GFR 57  04/14/2024 Creatinine 1.10, BUN 16, Potassium 3.9, Sodium 141, GFR >60  04/12/2024 Creatinine 1.04, BUN 15, Potassium 3.8, Sodium 138, GFR >60  04/10/2024 Creatinine 1.11, BUN 12, Potassium 4.0, Sodium 141, GFR >60 04/09/2024 Creatinine 1.25, BUN 12, Potassium 3.0, Sodium 140, GFR >60  04/06/2024 Creatinine 1.65, BUN 8,   Potassium 4.2, Sodium 143, GFR 45  A complete set of results can be found in Results Review.   Recommendations:  No changes.    Follow-up plan: ICM clinic phone appointment on 06/22/2024.    91 day device clinic remote transmission 08/10/2024.     EP/Cardiology Office Visits:    07/27/2024 with Dr Shuck.       Copy of ICM check sent to Dr. Nancey.  3 month ICM trend: 05/18/2024.    12-14 Month ICM trend:     [name], RN 05/20/2024 4:16 PM

## 2024-06-02 ENCOUNTER — Encounter

## 2024-06-08 ENCOUNTER — Encounter: Admitting: *Deleted

## 2024-06-08 VITALS — BP 111/66 | HR 65 | Temp 98.0°F | Ht 66.0 in | Wt 150.0 lb

## 2024-06-08 DIAGNOSIS — Z006 Encounter for examination for normal comparison and control in clinical research program: Secondary | ICD-10-CM

## 2024-06-08 NOTE — Research (Addendum)
 COMET  Informed Consent   Subject Name: Daniel Li  Subject met inclusion and exclusion criteria.  The informed consent form, study requirements and expectations were reviewed with the subject and questions and concerns were addressed prior to the signing of the consent form.  The subject verbalized understanding of the trial requirements.  The subject agreed to participate in the COMET trial and signed the informed consent at 11:40 AM  on 06-08-2024.  The informed consent was obtained prior to performance of any protocol-specific procedures for the subject.  A copy of the signed informed consent was given to the subject and a copy was placed in the subject's medical record.   Khyleigh Furney    Eligibility Criteria:  More detailed eligibility criteria are presented in Section 5. Inclusion Criteria Adult patients who meet all the following criteria at screening may be included in the study:  Are between >= 18 years and <= 85 years at the signing of informed consent  Have a history of chronic HFrEF, defined as requiring treatment for HF for a minimum of  3 months prior to screening  Are receiving oral loop diuretics  Patients without AFF on screening ECG: ? LVEF < 30% within 6 months of screening ? Elevated N-terminal prohormone of B-type natriuretic peptide (NT-proBNP) >= 1000  pg/mL (BNP >= 300 pg/mL   Patients with AFF on screening ECG: ? LVEF < 25% within 6 months of screening ? Elevated N-terminal prohormone of B-type natriuretic peptide (NT-proBNP) >= 3000  pg/mL (BNP >= 900 pg/mL) ? Not currently taking digoxin   Are currently hospitalized with the primary reason of HF, or had an HF event within 6  months prior to screening  Are established on regional standard-of-care HF therapies for at least 30 days prior to screening  Systolic blood pressure <= 130 mmHg and diastolic blood pressure <= 90 mmH   Exclusion Criteria  Any of the following criteria will exclude  potential patients from the study:  Have AFF on the screening ECG and are currently taking digoxin   Have had acute coronary syndrome, cardiac surgery, valve surgery, any coronary  revascularization, and/or cardiac resynchronization therapy within 3 months of screening  Are admitted to a long-term care facility or hospice  Have a projected survival of < 12 months due to non-cardiovascular causes based on clinical  judgment  Are receiving intravenous inotropes or intravenous vasopressors <= 3 days prior to screening  Are receiving mechanical hemodynamic support or mechanical ventilation <= 7 days prior to  screening  Are receiving intravenous diuretics, intravenous vasodilators, or supplemental oxygen  therapy  <= 12 hours prior to screening  Have an estimated glomerular filtration rate (eGFR) < 20 mL/min/1.25m2 or receiving dialysis  at screening  Have previously had a solid organ transplant   Are receiving treatment in another investigational device or drug study or are within 30 days of  ending such investigational treatment at screening  Have previously received omecamtiv mecarbil  Are pregnant or planning pregnancy during the study period, or planning to breastfeed during  treatment with IP or within 5 days after the end of treatment with I     Comet Screening Visit  Date of Visit : 06-08-2024   Physical Exam : Dr. Rolan Coombe patient signed ECG    Last Hospitalization admission date for HF: 01-22-2024  Last Discharge date for HF: 01-23-2024   Electrocardiogram-complete Sinus Rhythm   Screening Labs(Local)  Chemistry,Hematology, Nt-ProBNP, HS Troponin I -Collected  EQ-5D-5L:  Completed  KCCQ: Completed  Current Outpatient Medications on File Prior to Visit  Medication Sig Dispense Refill   acetaminophen  (TYLENOL ) 325 MG tablet Take 650 mg by mouth every 6 (six) hours as needed for mild pain.     albuterol  (VENTOLIN  HFA) 108 (90 Base) MCG/ACT inhaler Inhale 2 puffs  into the lungs every 4 (four) hours as needed for wheezing or shortness of breath. 1 each 3   amiodarone  (PACERONE ) 200 MG tablet Take 1 tablet (200 mg total) by mouth daily. 90 tablet 3   apixaban  (ELIQUIS ) 5 MG TABS tablet Take 2 tablets (10 mg total) by mouth 2 (two) times daily for 6 days, THEN 1 tablet (5 mg total) 2 (two) times daily. 60 tablet 2   digoxin  (LANOXIN ) 0.125 MG tablet TAKE (1) TABLET BY MOUTH ONCE DAILY. 30 tablet 11   empagliflozin  (JARDIANCE ) 10 MG TABS tablet Take by mouth daily.     ezetimibe  (ZETIA ) 10 MG tablet Take 1 tablet (10 mg total) by mouth daily. 30 tablet 3   gabapentin  (NEURONTIN ) 100 MG capsule Take 100 mg by mouth 2 (two) times daily.     levothyroxine  (SYNTHROID ) 100 MCG tablet Take 1 tablet (100 mcg total) by mouth daily at 6 (six) AM. (Patient not taking: Reported on 05/12/2024) 30 tablet 0   lidocaine  (LIDODERM ) 5 % Place 1 patch onto the skin every 12 (twelve) hours. Leave off 12 hours     metFORMIN  (GLUCOPHAGE ) 500 MG tablet Take 500 mg by mouth daily with breakfast.     midodrine  (PROAMATINE ) 5 MG tablet Take 1 tablet (5 mg total) by mouth 3 (three) times daily with meals. (Patient not taking: Reported on 05/12/2024) 90 tablet 1   nitroGLYCERIN  (NITROSTAT ) 0.4 MG SL tablet PLACE ONE (1) TABLET UNDER TONGUE EVERY 5 MINUTES UP TO (3) DOSES AS NEEDED FOR CHEST PAIN. IF NO RELIEF, CONTACT MD. 25 tablet 3   pantoprazole  (PROTONIX ) 40 MG tablet Take 40 mg by mouth daily.     potassium chloride  (KLOR-CON ) 10 MEQ tablet Take 2 tablets (20 mEq total) by mouth daily. 60 tablet 11   sacubitril -valsartan  (ENTRESTO ) 24-26 MG Take 1 tablet by mouth 2 (two) times daily.     spironolactone  (ALDACTONE ) 25 MG tablet Take 25 mg by mouth daily.     thiamine  (VITAMIN B-1) 100 MG tablet Take 1 tablet (100 mg total) by mouth daily. 30 tablet 2   torsemide  (DEMADEX ) 20 MG tablet TAKE (1) TABLET BY MOUTH ONCE DAILY. 30 tablet 11   No current facility-administered medications on  file prior to visit.

## 2024-06-10 ENCOUNTER — Ambulatory Visit (HOSPITAL_COMMUNITY): Payer: Self-pay | Admitting: Cardiology

## 2024-06-10 LAB — CBC
Hematocrit: 44 % (ref 37.5–51.0)
Hemoglobin: 13.5 g/dL (ref 13.0–17.7)
MCH: 25 pg — ABNORMAL LOW (ref 26.6–33.0)
MCHC: 30.7 g/dL — ABNORMAL LOW (ref 31.5–35.7)
MCV: 82 fL (ref 79–97)
Platelets: 265 x10E3/uL (ref 150–450)
RBC: 5.39 x10E6/uL (ref 4.14–5.80)
RDW: 15.8 % — ABNORMAL HIGH (ref 11.6–15.4)
WBC: 6.3 x10E3/uL (ref 3.4–10.8)

## 2024-06-10 LAB — COMPREHENSIVE METABOLIC PANEL WITH GFR
ALT: 17 IU/L (ref 0–44)
AST: 32 IU/L (ref 0–40)
Albumin: 4.4 g/dL (ref 3.9–4.9)
Alkaline Phosphatase: 72 IU/L (ref 44–121)
BUN/Creatinine Ratio: 13 (ref 10–24)
BUN: 15 mg/dL (ref 8–27)
Bilirubin Total: 0.4 mg/dL (ref 0.0–1.2)
CO2: 26 mmol/L (ref 20–29)
Calcium: 9.6 mg/dL (ref 8.6–10.2)
Chloride: 103 mmol/L (ref 96–106)
Creatinine, Ser: 1.13 mg/dL (ref 0.76–1.27)
Globulin, Total: 2.6 g/dL (ref 1.5–4.5)
Glucose: 78 mg/dL (ref 70–99)
Potassium: 3.9 mmol/L (ref 3.5–5.2)
Sodium: 143 mmol/L (ref 134–144)
Total Protein: 7 g/dL (ref 6.0–8.5)
eGFR: 71 mL/min/1.73 (ref >59)

## 2024-06-10 LAB — PRO B NATRIURETIC PEPTIDE: NT-Pro BNP: 756 pg/mL — ABNORMAL HIGH (ref 0–376)

## 2024-06-10 LAB — TROPONIN T: Troponin T (Highly Sensitive): 29 ng/L (ref 0–22)

## 2024-06-11 ENCOUNTER — Other Ambulatory Visit (HOSPITAL_COMMUNITY): Payer: Self-pay | Admitting: Cardiology

## 2024-06-11 MED ORDER — APIXABAN 5 MG PO TABS: 5.0000 mg | ORAL_TABLET | Freq: Two times a day (BID) | ORAL | 3 refills | Status: AC

## 2024-06-11 MED ORDER — MIDODRINE HCL 5 MG PO TABS: 5.0000 mg | ORAL_TABLET | Freq: Three times a day (TID) | ORAL | 3 refills | Status: DC

## 2024-06-16 ENCOUNTER — Encounter

## 2024-06-16 VITALS — BP 112/66 | HR 64 | Temp 98.2°F | Resp 20 | Wt 149.7 lb

## 2024-06-16 DIAGNOSIS — Z006 Encounter for examination for normal comparison and control in clinical research program: Secondary | ICD-10-CM

## 2024-06-16 MED ORDER — STUDY - COMET-HF - OMECAMTIV MECARBIL 25 MG TABLET (2-WEEK RUN IN)(PI-MCLEAN)
25.0000 mg | ORAL_TABLET | Freq: Two times a day (BID) | ORAL | Status: AC
  Administered 2024-06-16: 25 mg via ORAL
  Filled 2024-06-16: qty 1

## 2024-06-16 MED ORDER — STUDY - COMET-HF - OMECAMTIV MECARBIL 25 MG TABLET (2-WEEK RUN IN)(PI-MCLEAN): 25.0000 mg | ORAL_TABLET | Freq: Two times a day (BID) | ORAL | 0 refills | Status: DC

## 2024-06-16 NOTE — Research (Addendum)
 [place] in Visit   Date of Visit: 06-16-2024  Was Chronic Inotrope Therapy changed/Initiated:Yes   Were Heart Failure medications Changed/Initiated: No    Potential Endpoint or Non-serious event : No   Hospitalization > 24 hours since last visit No   Central labs Collected: Yes    EQ-5D-5L: Yes  KCCQ :Yes  IP Administration: [id number]  Study Drug Adminstered: Yes  Date of Administration: 06-16-2024   Time of Administration: 11:51   Patient doing well since last visit no medication changes

## 2024-06-21 ENCOUNTER — Other Ambulatory Visit: Payer: Self-pay

## 2024-06-21 ENCOUNTER — Emergency Department (HOSPITAL_COMMUNITY)

## 2024-06-21 ENCOUNTER — Emergency Department (HOSPITAL_COMMUNITY)
Admission: EM | Admit: 2024-06-21 | Discharge: 2024-06-22 | Disposition: A | Discharge status: Home / Self Care | Source: Other Acute Inpatient Hospital | Attending: Student | Admitting: Student

## 2024-06-21 ENCOUNTER — Encounter (HOSPITAL_COMMUNITY): Payer: Self-pay | Admitting: Emergency Medicine

## 2024-06-21 DIAGNOSIS — R079 Chest pain, unspecified: Secondary | ICD-10-CM | POA: Diagnosis not present

## 2024-06-21 DIAGNOSIS — F1010 Alcohol abuse, uncomplicated: Secondary | ICD-10-CM

## 2024-06-21 DIAGNOSIS — E119 Type 2 diabetes mellitus without complications: Secondary | ICD-10-CM | POA: Insufficient documentation

## 2024-06-21 DIAGNOSIS — I110 Hypertensive heart disease with heart failure: Secondary | ICD-10-CM | POA: Insufficient documentation

## 2024-06-21 DIAGNOSIS — Z79899 Other long term (current) drug therapy: Secondary | ICD-10-CM | POA: Diagnosis not present

## 2024-06-21 DIAGNOSIS — F17210 Nicotine dependence, cigarettes, uncomplicated: Secondary | ICD-10-CM | POA: Insufficient documentation

## 2024-06-21 DIAGNOSIS — R0789 Other chest pain: Secondary | ICD-10-CM | POA: Insufficient documentation

## 2024-06-21 DIAGNOSIS — Z95810 Presence of automatic (implantable) cardiac defibrillator: Secondary | ICD-10-CM | POA: Insufficient documentation

## 2024-06-21 DIAGNOSIS — Y906 Blood alcohol level of 120-199 mg/100 ml: Secondary | ICD-10-CM | POA: Diagnosis not present

## 2024-06-21 DIAGNOSIS — I2510 Atherosclerotic heart disease of native coronary artery without angina pectoris: Secondary | ICD-10-CM | POA: Insufficient documentation

## 2024-06-21 DIAGNOSIS — F1090 Alcohol use, unspecified, uncomplicated: Secondary | ICD-10-CM | POA: Diagnosis present

## 2024-06-21 DIAGNOSIS — Z7984 Long term (current) use of oral hypoglycemic drugs: Secondary | ICD-10-CM | POA: Insufficient documentation

## 2024-06-21 DIAGNOSIS — Z7901 Long term (current) use of anticoagulants: Secondary | ICD-10-CM | POA: Insufficient documentation

## 2024-06-21 DIAGNOSIS — Z955 Presence of coronary angioplasty implant and graft: Secondary | ICD-10-CM | POA: Insufficient documentation

## 2024-06-21 DIAGNOSIS — I509 Heart failure, unspecified: Secondary | ICD-10-CM | POA: Diagnosis not present

## 2024-06-21 DIAGNOSIS — F10920 Alcohol use, unspecified with intoxication, uncomplicated: Secondary | ICD-10-CM | POA: Diagnosis not present

## 2024-06-21 DIAGNOSIS — Z8673 Personal history of transient ischemic attack (TIA), and cerebral infarction without residual deficits: Secondary | ICD-10-CM | POA: Insufficient documentation

## 2024-06-21 DIAGNOSIS — R739 Hyperglycemia, unspecified: Secondary | ICD-10-CM | POA: Diagnosis not present

## 2024-06-21 LAB — RESP PANEL BY RT-PCR (RSV, FLU A&B, COVID)  RVPGX2
Influenza A by PCR: NEGATIVE
Influenza B by PCR: NEGATIVE
Resp Syncytial Virus by PCR: NEGATIVE
SARS Coronavirus 2 by RT PCR: NEGATIVE

## 2024-06-21 LAB — CBC WITH DIFFERENTIAL/PLATELET
Abs Immature Granulocytes: 0 K/uL (ref 0.00–0.07)
Basophils Absolute: 0 K/uL (ref 0.0–0.1)
Basophils Relative: 0 %
Eosinophils Absolute: 0.1 K/uL (ref 0.0–0.5)
Eosinophils Relative: 1 %
HCT: 38.6 % — ABNORMAL LOW (ref 39.0–52.0)
Hemoglobin: 12.2 g/dL — ABNORMAL LOW (ref 13.0–17.0)
Immature Granulocytes: 0 %
Lymphocytes Relative: 39 %
Lymphs Abs: 2.1 K/uL (ref 0.7–4.0)
MCH: 25.2 pg — ABNORMAL LOW (ref 26.0–34.0)
MCHC: 31.6 g/dL (ref 30.0–36.0)
MCV: 79.8 fL — ABNORMAL LOW (ref 80.0–100.0)
Monocytes Absolute: 0.3 K/uL (ref 0.1–1.0)
Monocytes Relative: 6 %
Neutro Abs: 2.9 K/uL (ref 1.7–7.7)
Neutrophils Relative %: 54 %
Platelets: 246 K/uL (ref 150–400)
RBC: 4.84 MIL/uL (ref 4.22–5.81)
RDW: 18.4 % — ABNORMAL HIGH (ref 11.5–15.5)
WBC: 5.4 K/uL (ref 4.0–10.5)
nRBC: 0 % (ref 0.0–0.2)

## 2024-06-21 LAB — ETHANOL: Alcohol, Ethyl (B): 175 mg/dL — ABNORMAL HIGH (ref <15)

## 2024-06-21 LAB — COMPREHENSIVE METABOLIC PANEL WITH GFR
ALT: 24 U/L (ref 0–44)
AST: 38 U/L (ref 15–41)
Albumin: 3.7 g/dL (ref 3.5–5.0)
Alkaline Phosphatase: 55 U/L (ref 38–126)
Anion gap: 15 (ref 5–15)
BUN: 14 mg/dL (ref 8–23)
CO2: 22 mmol/L (ref 22–32)
Calcium: 8.8 mg/dL — ABNORMAL LOW (ref 8.9–10.3)
Chloride: 105 mmol/L (ref 98–111)
Creatinine, Ser: 1.27 mg/dL — ABNORMAL HIGH (ref 0.61–1.24)
GFR, Estimated: >60 mL/min (ref >60)
Glucose, Bld: 84 mg/dL (ref 70–99)
Potassium: 3.4 mmol/L — ABNORMAL LOW (ref 3.5–5.1)
Sodium: 142 mmol/L (ref 135–145)
Total Bilirubin: 0.4 mg/dL (ref 0.0–1.2)
Total Protein: 6.9 g/dL (ref 6.5–8.1)

## 2024-06-21 LAB — CBG MONITORING, ED: Glucose-Capillary: 84 mg/dL (ref 70–99)

## 2024-06-21 MED ORDER — KETOROLAC TROMETHAMINE 15 MG/ML IJ SOLN: 15.0000 mg | Freq: Once | INTRAMUSCULAR | Status: DC

## 2024-06-21 NOTE — ED Triage Notes (Signed)
 Facility called out for hyperglycemia. Pt c/o pain all over and has been drinking alcohol.

## 2024-06-21 NOTE — ED Provider Notes (Signed)
 11:11 PM Assumed care from Dr. Albertina, please see their note for full history, physical and decision making until this point. In brief this is a 68 y.o. year old male who presented to the ED tonight with Chest Pain     Everything hurts. Intoxicated. Needs reeval after sober.   No longer in pain, however still unsteady on feet. Will allow to continue to metabolize alcohol to clinical sobriety.   Care transferred to Dr. Suzette pending reevaluation.   Labs, studies and imaging reviewed by myself and considered in medical decision making if ordered. Imaging interpreted by radiology.  Labs Reviewed  COMPREHENSIVE METABOLIC PANEL WITH GFR - Abnormal; Notable for the following components:      Result Value   Potassium 3.4 (*)    Creatinine, Ser 1.27 (*)    Calcium  8.8 (*)    All other components within normal limits  CBC WITH DIFFERENTIAL/PLATELET - Abnormal; Notable for the following components:   Hemoglobin 12.2 (*)    HCT 38.6 (*)    MCV 79.8 (*)    MCH 25.2 (*)    RDW 18.4 (*)    All other components within normal limits  ETHANOL - Abnormal; Notable for the following components:   Alcohol, Ethyl (B) 175 (*)    All other components within normal limits  RESP PANEL BY RT-PCR (RSV, FLU A&B, COVID)  [id number]  URINALYSIS, ROUTINE W REFLEX MICROSCOPIC  CBG MONITORING, ED    DG Chest Portable 1 View  Final Result      No follow-ups on file.    Daisuke Bailey, Selinda, MD 06/23/24 [phone]

## 2024-06-22 ENCOUNTER — Encounter: Attending: Cardiovascular Disease

## 2024-06-22 ENCOUNTER — Other Ambulatory Visit: Payer: Self-pay

## 2024-06-22 DIAGNOSIS — Z95810 Presence of automatic (implantable) cardiac defibrillator: Secondary | ICD-10-CM | POA: Diagnosis not present

## 2024-06-22 DIAGNOSIS — R0789 Other chest pain: Secondary | ICD-10-CM | POA: Diagnosis not present

## 2024-06-22 DIAGNOSIS — I5022 Chronic systolic (congestive) heart failure: Secondary | ICD-10-CM | POA: Insufficient documentation

## 2024-06-22 LAB — TROPONIN I (HIGH SENSITIVITY)
Troponin I (High Sensitivity): 44 ng/L — ABNORMAL HIGH (ref <18)
Troponin I (High Sensitivity): 45 ng/L — ABNORMAL HIGH (ref <18)

## 2024-06-22 LAB — URINALYSIS, ROUTINE W REFLEX MICROSCOPIC
Bilirubin Urine: NEGATIVE
Glucose, UA: NEGATIVE mg/dL
Hgb urine dipstick: NEGATIVE
Ketones, ur: NEGATIVE mg/dL
Leukocytes,Ua: NEGATIVE
Nitrite: NEGATIVE
Protein, ur: NEGATIVE mg/dL
Specific Gravity, Urine: 1.006 (ref 1.005–1.030)
pH: 5 (ref 5.0–8.0)
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
## 2024-06-22 NOTE — ED Notes (Signed)
 Patient making phone call to find ride at this time.

## 2024-06-22 NOTE — ED Notes (Signed)
 Tried to ambulate PT but knees gave out after one step.

## 2024-06-22 NOTE — ED Notes (Signed)
 Patient was given a sandwhich with some beverages.

## 2024-06-22 NOTE — Discharge Instructions (Signed)
Follow-up with the family doctor next week for recheck

## 2024-06-25 NOTE — Progress Notes (Signed)
 EPIC Encounter for ICM Monitoring  Patient Name: Daniel Li is a 68 y.o. male Date: 06/25/2024 Primary Care Physican: Rosamond Leta NOVAK, MD Primary Cardiologist: [facility] Electrophysiologist: Mealor 08/10/2022 Office Weight: 140 lbs                11/05/2023 Weight: 150 lbs   03/12/2024 Office Weight: 149 lbs    05/12/2024 Office Weight: 147 lbs                              Transmission results reviewed.    CorVue thoracic impedance suggesting normal fluid levels with the exception of possible fluid accumulation from 7/22-8/1.   Prescribed dosage:  Torsemide  20 mg Take 1 tablet(s) (20 mg total) by mouth daily Potassium 10 mEq take 2 tablets (20 mEq total) by mouth daily Spironolactone  25 mg take 1 tablet (25 mg total) daily   Labs: 06/21/2024 Creatinine 1.27, BUN 14, Potassium 3.4, Sodium 142, GFR >60 06/08/2024 Creatinine 1.13, BUN 15, Potassium 3.9, Sodium 143 05/12/2024 Creatinine 1.36, BUN 17, Potassium 4.0, Sodium 138, GFR 57  04/14/2024 Creatinine 1.10, BUN 16, Potassium 3.9, Sodium 141, GFR >60  04/12/2024 Creatinine 1.04, BUN 15, Potassium 3.8, Sodium 138, GFR >60  04/10/2024 Creatinine 1.11, BUN 12, Potassium 4.0, Sodium 141, GFR >60 04/09/2024 Creatinine 1.25, BUN 12, Potassium 3.0, Sodium 140, GFR >60  04/06/2024 Creatinine 1.65, BUN 8,   Potassium 4.2, Sodium 143, GFR 45  A complete set of results can be found in Results Review.   Recommendations:  No changes.    Follow-up plan: ICM clinic phone appointment on 07/27/2024.    91 day device clinic remote transmission 08/10/2024.     EP/Cardiology Office Visits:    07/27/2024 with Dr Shuck.    Recall 03/25/2025 with EP APP.   Copy of ICM check sent to Dr. Nancey.  3 month ICM trend: 06/23/2024.    12-14 Month ICM trend:     [name], RN 06/25/2024 7:28 AM

## 2024-06-30 ENCOUNTER — Telehealth: Payer: Self-pay

## 2024-06-30 ENCOUNTER — Encounter: Admitting: *Deleted

## 2024-06-30 VITALS — BP 122/78 | HR 69 | Temp 98.0°F | Ht 66.0 in | Wt 146.1 lb

## 2024-06-30 DIAGNOSIS — Z006 Encounter for examination for normal comparison and control in clinical research program: Secondary | ICD-10-CM

## 2024-06-30 DIAGNOSIS — R0789 Other chest pain: Secondary | ICD-10-CM

## 2024-06-30 NOTE — Research (Signed)
 Run in Screen failure visit.  Patients BNP should have been over 1,000. Patient did not meet inclusion. Took 2 pills the day of run in visit. Patient returned pills and will be a screen failure.    [place] Revis Whalin, Designer, industrial/product. 06/30/2024 11:39 am

## 2024-07-02 ENCOUNTER — Telehealth: Payer: Self-pay

## 2024-07-02 NOTE — Progress Notes (Signed)
 Complex Care Management Note  Care Guide Note 07/02/2024 Name: Daniel Li MRN: [id number] DOB: 1956/04/02  Daniel Li is a 68 y.o. year old male who sees Vyas, Leta NOVAK, MD for primary care. I reached out to Daniel Li by phone today to offer complex care management services.  Mr. Menter was given information about Complex Care Management services today including:   The Complex Care Management services include support from the care team which includes your Nurse Care Manager, Clinical Social Worker, or Pharmacist.  The Complex Care Management team is here to help remove barriers to the health concerns and goals most important to you. Complex Care Management services are voluntary, and the patient may decline or stop services at any time by request to their care team member.   Complex Care Management Consent Status: Patient agreed to services and verbal consent obtained.   Follow up plan:  Telephone appointment with complex care management team member scheduled for:  07/15/24 @ 1 PM  Encounter Outcome:  Patient Scheduled  Leotis Rase [facility] Guide  Direct Dial: [phone]  Fax [phone]

## 2024-07-08 DIAGNOSIS — I2510 Atherosclerotic heart disease of native coronary artery without angina pectoris: Secondary | ICD-10-CM | POA: Diagnosis not present

## 2024-07-08 DIAGNOSIS — I4891 Unspecified atrial fibrillation: Secondary | ICD-10-CM | POA: Diagnosis not present

## 2024-07-08 DIAGNOSIS — I5022 Chronic systolic (congestive) heart failure: Secondary | ICD-10-CM | POA: Diagnosis not present

## 2024-07-08 DIAGNOSIS — I10 Essential (primary) hypertension: Secondary | ICD-10-CM | POA: Diagnosis not present

## 2024-07-08 DIAGNOSIS — J439 Emphysema, unspecified: Secondary | ICD-10-CM | POA: Diagnosis not present

## 2024-07-08 DIAGNOSIS — Z299 Encounter for prophylactic measures, unspecified: Secondary | ICD-10-CM | POA: Diagnosis not present

## 2024-07-15 ENCOUNTER — Other Ambulatory Visit: Payer: Self-pay | Admitting: Licensed Clinical Social Worker

## 2024-07-15 NOTE — Patient Outreach (Signed)
 Complex Care Management   Visit Note  07/15/2024  Name:  Daniel Li MRN: [id number] DOB: 12/04/1955  Situation: Referral received for Complex Care Management related to EMMI-Social Work. VBCI LCSW obtained verbal consent from Patient.  Visit completed with Patient  on the phone  Background:   Past Medical History:  Diagnosis Date   AICD (automatic cardioverter/defibrillator) present    Anxiety    Arthritis    all over (09/17/2017)   Burn    CAD (coronary artery disease)    a. NSTEMI s/p BMS to 1st Diagonal and distal OM2 in 2007; b. STEMI 03/26/12 s/p BMS to RCA; c. NSTEMI 10/2012 : CTO of LCx (unable to open with PCI) and PL branch, mod dz of LAD and diagonal, and preserved LV systolic fxn, Med Rx;  d.  anterior STEMI in 01/2017 with DES to Proximal LAD   CAD in native artery 08/23/2020   Cardiomyopathy EF 35% on cath 06/30/14, new from jan 2015 07/20/2014   Chest pain    CHF (congestive heart failure) (HCC)    Chronic back pain    all over (09/17/2017)   DDD (degenerative disc disease), cervical    Depression    GERD (gastroesophageal reflux disease)    Headache    a few/wk (09/17/2017)   HTN (hypertension)    Hypercholesterolemia    Mental disorder    Myocardial infarction (HCC)    I've had 7 (09/17/2017)   Pulmonary edema    Respiratory failure (HCC)    Sciatic pain    Sleep apnea    Stroke [facility])    a. multiple dating back to 2002; *I''ve had 5; LUE/LLE weaker since (09/17/2017)   Tibia fracture (l) leg   Tobacco abuse    Type 2 diabetes mellitus (HCC)    Unstable angina (HCC)    Patient Reported Symptoms: Patient reports doing well today. He shares that he is a resident at [facility]. His aide Grayce was present during session today. Patient reports that he is feeling better since his last ED visit on 06/21/24 and denies interest in gaining any behavioral health support/services at this time. He reports that he is managing his stress better now that  he is physically doing better. He denies any social work needs at this time but did agree to write this VBCI LCSW's contact information in case things were to change in the future. Patient was very appreciative of phone call and program education provided. VBCI LCSW will sign off at this time.   Recommendation:   PCP Follow-up Continue Current Plan of Care  Follow Up Plan:   Closing From:  Complex Care Management  Lyle Rung, BSW, MSW, LCSW Licensed Clinical Social Worker [place]   [facility] [email or web address] Direct Dial: [phone]

## 2024-07-15 NOTE — Patient Instructions (Signed)
 Visit Information  Thank you for taking time to visit with me today. Please don't hesitate to contact me if I can be of assistance to you in the future!   Please call the Suicide and Crisis Lifeline: 988 call the [place]  National Suicide Prevention Lifeline: (954)578-7556 or TTY: 9025808912 TTY (516)816-5288) to talk to a trained counselor call 1-800-273-TALK (toll free, 24 hour hotline) go to [facility] [address], [place] [phone]) call the [facility] Line: [phone] if you are experiencing a Mental Health or Behavioral Health Crisis or need someone to talk to.  Patient verbalizes understanding of instructions and care plan provided today and agrees to view in MyChart. Active MyChart status and patient understanding of how to access instructions and care plan via MyChart confirmed with patient.     Lyle Rung, BSW, MSW, LCSW Licensed Clinical Social Worker [facility]   [facility] [email or web address] Direct Dial: [phone]

## 2024-07-17 DIAGNOSIS — I509 Heart failure, unspecified: Secondary | ICD-10-CM | POA: Diagnosis not present

## 2024-07-17 DIAGNOSIS — Z743 Need for continuous supervision: Secondary | ICD-10-CM | POA: Diagnosis not present

## 2024-07-17 DIAGNOSIS — J9811 Atelectasis: Secondary | ICD-10-CM | POA: Diagnosis not present

## 2024-07-17 DIAGNOSIS — Z7984 Long term (current) use of oral hypoglycemic drugs: Secondary | ICD-10-CM | POA: Diagnosis not present

## 2024-07-17 DIAGNOSIS — M25461 Effusion, right knee: Secondary | ICD-10-CM | POA: Diagnosis not present

## 2024-07-17 DIAGNOSIS — Z95810 Presence of automatic (implantable) cardiac defibrillator: Secondary | ICD-10-CM | POA: Diagnosis not present

## 2024-07-17 DIAGNOSIS — R0602 Shortness of breath: Secondary | ICD-10-CM | POA: Diagnosis not present

## 2024-07-17 DIAGNOSIS — Z79899 Other long term (current) drug therapy: Secondary | ICD-10-CM | POA: Diagnosis not present

## 2024-07-17 DIAGNOSIS — G8929 Other chronic pain: Secondary | ICD-10-CM | POA: Diagnosis not present

## 2024-07-17 DIAGNOSIS — I2510 Atherosclerotic heart disease of native coronary artery without angina pectoris: Secondary | ICD-10-CM | POA: Diagnosis not present

## 2024-07-17 DIAGNOSIS — Z7901 Long term (current) use of anticoagulants: Secondary | ICD-10-CM | POA: Diagnosis not present

## 2024-07-17 DIAGNOSIS — M25561 Pain in right knee: Secondary | ICD-10-CM | POA: Diagnosis not present

## 2024-07-17 DIAGNOSIS — E119 Type 2 diabetes mellitus without complications: Secondary | ICD-10-CM | POA: Diagnosis not present

## 2024-07-24 ENCOUNTER — Telehealth (HOSPITAL_COMMUNITY): Payer: Self-pay | Admitting: Cardiology

## 2024-07-24 NOTE — Telephone Encounter (Signed)
 Called to confirm/remind patient of their appointment at the [facility] on 07/24/2024.   Appointment:   [] Confirmed  [] Left mess   [x] No answer/No voice mail  [] VM Full/unable to leave message  [] Phone not in service  Patient reminded to bring all medications and/or complete list.  Confirmed patient has transportation. Gave directions, instructed to utilize valet parking.

## 2024-07-27 ENCOUNTER — Ambulatory Visit (HOSPITAL_COMMUNITY)
Admission: RE | Admit: 2024-07-27 | Discharge: 2024-07-27 | Disposition: A | Discharge status: Home / Self Care | Source: Ambulatory Visit | Attending: Cardiology | Admitting: Cardiology

## 2024-07-27 ENCOUNTER — Encounter (HOSPITAL_COMMUNITY): Payer: Self-pay | Admitting: Cardiology

## 2024-07-27 ENCOUNTER — Encounter: Attending: Cardiovascular Disease

## 2024-07-27 ENCOUNTER — Ambulatory Visit (HOSPITAL_COMMUNITY): Payer: Self-pay | Admitting: Cardiology

## 2024-07-27 ENCOUNTER — Encounter: Payer: Self-pay | Admitting: *Deleted

## 2024-07-27 VITALS — BP 118/72 | HR 63 | Ht 66.0 in | Wt 143.2 lb

## 2024-07-27 DIAGNOSIS — I255 Ischemic cardiomyopathy: Secondary | ICD-10-CM | POA: Insufficient documentation

## 2024-07-27 DIAGNOSIS — E7800 Pure hypercholesterolemia, unspecified: Secondary | ICD-10-CM | POA: Insufficient documentation

## 2024-07-27 DIAGNOSIS — Z79899 Other long term (current) drug therapy: Secondary | ICD-10-CM | POA: Insufficient documentation

## 2024-07-27 DIAGNOSIS — F17290 Nicotine dependence, other tobacco product, uncomplicated: Secondary | ICD-10-CM | POA: Diagnosis not present

## 2024-07-27 DIAGNOSIS — I5022 Chronic systolic (congestive) heart failure: Secondary | ICD-10-CM | POA: Diagnosis not present

## 2024-07-27 DIAGNOSIS — I340 Nonrheumatic mitral (valve) insufficiency: Secondary | ICD-10-CM | POA: Insufficient documentation

## 2024-07-27 DIAGNOSIS — Z86711 Personal history of pulmonary embolism: Secondary | ICD-10-CM | POA: Diagnosis not present

## 2024-07-27 DIAGNOSIS — F1410 Cocaine abuse, uncomplicated: Secondary | ICD-10-CM | POA: Diagnosis not present

## 2024-07-27 DIAGNOSIS — Z006 Encounter for examination for normal comparison and control in clinical research program: Secondary | ICD-10-CM

## 2024-07-27 DIAGNOSIS — G4733 Obstructive sleep apnea (adult) (pediatric): Secondary | ICD-10-CM | POA: Insufficient documentation

## 2024-07-27 DIAGNOSIS — Z7901 Long term (current) use of anticoagulants: Secondary | ICD-10-CM | POA: Insufficient documentation

## 2024-07-27 DIAGNOSIS — I5042 Chronic combined systolic (congestive) and diastolic (congestive) heart failure: Secondary | ICD-10-CM

## 2024-07-27 DIAGNOSIS — Z955 Presence of coronary angioplasty implant and graft: Secondary | ICD-10-CM | POA: Insufficient documentation

## 2024-07-27 DIAGNOSIS — Z8673 Personal history of transient ischemic attack (TIA), and cerebral infarction without residual deficits: Secondary | ICD-10-CM | POA: Diagnosis not present

## 2024-07-27 DIAGNOSIS — I252 Old myocardial infarction: Secondary | ICD-10-CM | POA: Diagnosis not present

## 2024-07-27 DIAGNOSIS — Z7984 Long term (current) use of oral hypoglycemic drugs: Secondary | ICD-10-CM | POA: Diagnosis not present

## 2024-07-27 DIAGNOSIS — F17210 Nicotine dependence, cigarettes, uncomplicated: Secondary | ICD-10-CM | POA: Insufficient documentation

## 2024-07-27 DIAGNOSIS — I472 Ventricular tachycardia, unspecified: Secondary | ICD-10-CM | POA: Insufficient documentation

## 2024-07-27 DIAGNOSIS — Z95810 Presence of automatic (implantable) cardiac defibrillator: Secondary | ICD-10-CM | POA: Insufficient documentation

## 2024-07-27 DIAGNOSIS — I2510 Atherosclerotic heart disease of native coronary artery without angina pectoris: Secondary | ICD-10-CM | POA: Insufficient documentation

## 2024-07-27 DIAGNOSIS — E119 Type 2 diabetes mellitus without complications: Secondary | ICD-10-CM | POA: Insufficient documentation

## 2024-07-27 DIAGNOSIS — I110 Hypertensive heart disease with heart failure: Secondary | ICD-10-CM | POA: Insufficient documentation

## 2024-07-27 LAB — LIPID PANEL
Cholesterol: 196 mg/dL (ref 0–200)
HDL: 94 mg/dL (ref >40)
LDL Cholesterol: 89 mg/dL (ref 0–99)
Total CHOL/HDL Ratio: 2.1 ratio
Triglycerides: 65 mg/dL (ref <150)
VLDL: 13 mg/dL (ref 0–40)

## 2024-07-27 LAB — BASIC METABOLIC PANEL WITH GFR
Anion gap: 10 (ref 5–15)
BUN: 13 mg/dL (ref 8–23)
CO2: 28 mmol/L (ref 22–32)
Calcium: 9.4 mg/dL (ref 8.9–10.3)
Chloride: 100 mmol/L (ref 98–111)
Creatinine, Ser: 1.28 mg/dL — ABNORMAL HIGH (ref 0.61–1.24)
GFR, Estimated: >60 mL/min (ref >60)
Glucose, Bld: 107 mg/dL — ABNORMAL HIGH (ref 70–99)
Potassium: 4.1 mmol/L (ref 3.5–5.1)
Sodium: 138 mmol/L (ref 135–145)

## 2024-07-27 LAB — BRAIN NATRIURETIC PEPTIDE: B Natriuretic Peptide: 223.3 pg/mL — ABNORMAL HIGH (ref 0.0–100.0)

## 2024-07-27 LAB — DIGOXIN LEVEL: Digoxin Level: 1.2 ng/mL (ref 0.8–2.0)

## 2024-07-27 MED ORDER — SPIRONOLACTONE 25 MG PO TABS: 12.5000 mg | ORAL_TABLET | Freq: Every day | ORAL | 3 refills | Status: AC

## 2024-07-27 MED ORDER — JARDIANCE 10 MG PO TABS: 10.0000 mg | ORAL_TABLET | Freq: Every day | ORAL | 11 refills | Status: AC

## 2024-07-27 NOTE — Patient Instructions (Signed)
 STOP Midodrine .  RESTART Spironolactone  12.5 mg daily.  RESTART Jardiance  10 mg daily.  Labs done today, your results will be available in MyChart, we will contact you for abnormal readings.  Repeat blood work in 10 days.  You are scheduled for a Cardiopulmonary Exercise (CPX) Test as [facility] on: Date:      Time:   Expect to be in the lab for 2 hours. Please plan to arrive 30 minutes prior to your appointment. You may be asked to reschedule your test if you arrive 20 minutes or more after your scheduled appointment time.  Main Campus address: [address] [place], [place] [address] You may arrive to the Main Entrance A or Entrance C (free valet parking is available at both). -Main Entrance A (on [address]) :proceed to admitting for check in -Entrance C (on [facility]): proceed to [employer] parking or under hospital deck parking using this code _________  Check In: [facility] waiting room (1st floor)   General Instructions for the day of the test (Please follow all instructions from your physician): Refrain from ingesting a heavy meal, alcohol, or caffeine or using tobacco products within 2 hours of the test (DO NOT FAST for mare than 8 hours). You may have all other non-alcoholic, non -caffeinated beverage,a light snack (crackers,a piece of fruit, carrot sticks, toast bagel,etc) up to your appointment. Avoid significant exertion or exercise within 24 hours of your test. Be prepared to exercise and sweat. Your clothing should permit freedom of movement and include walking or running shoes. Women bring loose fitting short sleeved blouse.  This evaluation may be fatiguing and you may wish ti have someone accompany you to the assessment to drive you home afterward. Bring a list of your medications with you, including dosage and frequency you take the medications (  I.e.,once per day, twice per day, etc). Take all medications as prescribed, unless noted below or  instructed to do so by your physician.  Please do not take the following medications prior to your CPX:  _________________________________________________  _________________________________________________  Brief description of the test: A brief lung test will be performed. This will involve you taking deep breaths and blowing hard and fast through your mouth. During these , a clip will be on your nose and you will be breathing through a breathing device.   For the exercise portion of the test you will be walking on a treadmill, or riding a stationary bike, to your maximal effor or until symptoms such as chest pain, shortness of breath, leg pain or dizziness limit your exercise. You will be breathing in and out of a breathing device through your mouth (a clip will be on your nose again). Your heart rate, ECG, blood pressure, oxygen  saturations, breathing rate and depth, amount of oxygen  you consume and amount of carbon dioxide you produce will be measured and monitored throughout the exercise test.  If you need to cancel or reschedule your appointment please call [phone] If you have further questions please call your physician or Damien Nunnery at [phone]  Your physician recommends that you schedule a follow-up appointment in: 3 weeks.  If you have any questions or concerns before your next appointment please send us  a message through [place] or call our office at [phone].    TO LEAVE A MESSAGE FOR THE NURSE SELECT OPTION 2, PLEASE LEAVE A MESSAGE INCLUDING: YOUR NAME DATE OF BIRTH CALL BACK NUMBER REASON FOR CALL**this is important as we prioritize  the call backs  YOU WILL RECEIVE A CALL BACK THE SAME DAY AS LONG AS YOU CALL BEFORE 4:00 PM  At the [facility], you and your health needs are our priority. As part of our continuing mission to provide you with exceptional heart care, we have created designated Provider Care Teams. These Care Teams include your  primary Cardiologist (physician) and Advanced Practice Providers (APPs- Physician Assistants and Nurse Practitioners) who all work together to provide you with the care you need, when you need it.   You may see any of the following providers on your designated Care Team at your next follow up: Dr Toribio Fuel Dr Ezra Shuck Dr. Ria Commander Dr. Morene Brownie Amy Lenetta, NP Caffie Shed, [place] [facility] [place], [place] Beckey Coe, NP [place] Lee, NP Ellouise Class, NP Tinnie Redman, PharmD Jaun Bash, PharmD   Please be sure to bring in all your medications bottles to every appointment.    Thank you for choosing [facility]

## 2024-07-27 NOTE — Progress Notes (Signed)
 ADVANCED HF CLINIC NOTE  PCP: Rosamond Leta NOVAK, MD HF Cardiologist: Ezra Shuck, MD  Chief complaint: CHF  HPI: Daniel Li is a 68 y.o. male with history of CAD, chronic systolic CHF/ischemic cardiomyopathy s/p St. Jude ICD in 2018, HTN, HLD, tobacco abuse, hx ETOH abuse, mitral regurgitation, OSA, hx CVA, hx PE 05/24, DM.  He had NSTEMI in 2007 s/p BMS to D1 and OM2. Later had STEMI in 2013 s/p BMS to RCA. Had NSTEMI in 2014 and found to have CTO of LCX treated medically. He had anterior STEMI in 04/18 s/p DES to LAD. Had DES to mLAD 10/19 in setting of NSTEMI. Last LHC at [facility] in 2023: patent stents in p LAD and D1, moderate diffuse disease mLAD, LCX occluded proximally and fills via collaterals from LAD, patent pRCA stent, 50% PLV, 50% PDA. CAD treated medically.   EF has been in the 20-25% range by echo since 2018.   Presented for mTEER 10/24, however found no significant MR on TEE, despite multiple TTEs suggestive otherwise. Procedure aborted and he was discharged.   Admitted 03/12/24 with left arm weakness, dizziness, and near syncope. MRI and CT head without acute abnormalities. L arm weakness form previous CVA. At discharge, jardiance , entresto , torsemide , and spironolactone  held. Discharge back Assisted Living.   Admitted 6/25 with acute PE. CTA chest showed acute LLL PE. ETOH level 68. He had been off his Eliquis  x 2 months. LE dopplers negative for DVT. Echo showed EF 25-30%, normal RV, moderate MR. AC restarted. GDMT limited by low BP and needed midodrine . He was discharged back to ALF, weight 146 lbs.  He returns today for followup of CHF.  He is doing well symptomatically.  No significant exertional dyspnea or chest pain.  No orthopnea/PND.  No lightheadedness.  Weight down 4 lbs.  It appears that he is no longer taking Entresto , Farxiga, or spironolactone .  He is still taking midodrine .  He is drinking 2 beers every 2-3 days.  He admits to drinking some white  lightning over the weekend.    Labs (6/23): K 4.4, creatinine 1.46, LFTs normal, TSH normal, Lp (a) 232 Labs (2/24): LDL 45, BNP 171, K 4.3, creatinine 1.33 Labs (4/24): K 4.1, SCr 1.1 Labs (5/24): K 5.2, creatinine 1.12 Labs (7/24): K 4.7, creatininine 1.31 Labs (8/24): LDL 144, LFTs normal, TSH normal Labs (10/24): K 4, creatinine 1.21 Labs (5/25): LDL 44 Labs (6/25): K 3.9, creatinine 1.10, normal LFTs, TSH 11.6 Labs (7/25): TSH normal Labs (9/25): K 3.5, creatinine 1.34, pro-BNP 816, LFTs normal   St Jude device interrogation (personally reviewed): Stable thoracic impedance, no VT.    PMH: 1. OSA: Supposed to be using CPAP.  2. HTN 3. CAD: Long history.  - NSTEMI 2007 with BMS D1 and OM2. - STEMI 5/13 with BMS RCA - NSTEMI 1/14 with CTO LCx and PLV, unable to open LCx.  - STEMI 4/18 with LHC showing old TO LCx with collaterals, old TO PLV, 70% ostial D1, totally occluded proximal LAD with some collaterals => DES to LAD.  - LHC 8/18 with old TO LCx, old TO PLV, 60% mLAD, patent LAD stent.  - LHC 4/19 with totally occluded PLV, totally occluded LCx, patent LAD and D1 stents, 50% mid LAD.  - NSTEMI 10/19. LHC with totally occluded PLV, totally occluded LCx, patent proximal LAD and D1 stents, 80% mid LAD stenosis => DES to mid LAD.  - LHC (9/21): occluded PLV and proximal  LCx with collaterals, patent LAD, 60% D2, 60% PDA.  No intervention.  - LHC (1/23, [facility]): significant 3-vessel disease with patent stents; no further stents were placed 4. Depression 5. Type II DM 6. Hyperlipidemia: History of rhabdomyolysis with statins, he is on Zetia .  7. Chronic systolic CHF: Ischemic cardiomyopathy.  Echo from 9/16 showed improvement in EF to 55-60%.  St Jude ICD.  - Echo (4/18) with EF 20-25%, dyskinetic apex, moderate-severe MR, severe LAE.  - Echo (8/18) with EF 20-25%, moderate to severe MR - Echo (4/19) with EF 20-25%  - Echo (10/19) with EF 20-25%, no LV thrombus.  - RHC (10/19): mean  RA 4, PA 22/7, mean PCWP 7, CI 2.3 - RHC (9/21): mean RA 1, PA 18/5, mean PCWP 7, CI 2.12 (F)/2.17(T) - Echo (5/22): EF 25% with mild LV dilation, no LV thrombus, normal RV, mild MR. - RHC (1/23): CO 3.0 and low PWCP at 7 mmHg - Echo (1/23): EF 25% - Echo (4/23): EF 20-25% with mild LV dilation, no LV thrombus, moderate-severe MR with ERO 0.39 cm^2 by PISA, normal RV, PASP 49.  - Echo (2/24): EF 20%, severe LV dilation, slow flow at apex w/o frank clot, RV normal, at least moderate MR, IVC normal.  - Echo (7/24): EF 20%, slow flow at apex, severe LV dilation, mild RV dysfunction, mod-severe MR, IVC normal.  - TEE (10/24): LVEF 20-25%, RV normal, mild MR  - Echo (6/25): EF 25-30%, normal RV, moderate MR 8. PAD: - ABIs (4/18) with R ABI of 0.84 suggestive of mild disease.  L ABI of 1.27 (Normal flow at rest) - ABIs (5/19): abnormal TBI on right.  - ABIs (10/21): Normal 9. LV thrombus 10. H/o CVA 11. Mitral regurgitation: Likely functional. Echo in 4/23 with moderate-severe MR. EE (5/23): EF 20-25%, RV mildly reduced, no LAA, severe MR with no mitral stenosis, MV mean gradient 2.0 mmHg - TEE (5/23): EF 20-25%, mild RV dysfunction, severe MR.  - Echo (7/24): moderate-severe MR - TEE (10/24): LVEF 20-25%, RV normal, mild MR  12. PE: Diagnosed [facility] in 5/24. 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   ROS: All systems negative except as listed in HPI, PMH and Problem List.  SH:  Social History   Socioeconomic History   Marital status: Single    Spouse name: Not on file   Number of children: 0   Years of education: 9   Highest education level: 9th grade  Occupational History   Not on file  Tobacco Use   Smoking status: Every Day    Current packs/day: 0.50    Average packs/day: 0.5 packs/day for 47.0 years (23.5 ttl pk-yrs)    Types: Cigarettes, Cigars    Start date: 07/21/1977   Smokeless tobacco: Never  Vaping Use   Vaping status: Never Used  Substance and Sexual Activity   Alcohol use: Yes     Comment: 09/17/2017 nothing since 05/2017   Drug use: Not Currently    Comment: previously incarcerated for drug related offense.   Sexual activity: Not Currently  Other Topics Concern   Not on file  Social History Narrative   Not on file   Social Drivers of Health   Financial Resource Strain: Low Risk  (02/25/2023)   [facility] from [facility]   Overall Financial Resource Strain (CARDIA)    Difficulty of Paying Living Expenses: Not hard at all  Food Insecurity: No Food Insecurity (04/09/2024)   Hunger Vital Sign    Worried About Running Out  of Food in the Last Year: Never true    Ran Out of Food in the Last Year: Never true  Transportation Needs: No Transportation Needs (04/09/2024)   PRAPARE - Administrator, Civil Service (Medical): No    Lack of Transportation (Non-Medical): No  Recent Concern: Transportation Needs - Unmet Transportation Needs (01/22/2024)   PRAPARE - Administrator, Civil Service (Medical): Yes    Lack of Transportation (Non-Medical): Yes  Physical Activity: Not on file  Stress: Not on file  Social Connections: Moderately Isolated (04/09/2024)   Social Connection and Isolation Panel    Frequency of Communication with Friends and Family: More than three times a week    Frequency of Social Gatherings with Friends and Family: More than three times a week    Attends Religious Services: 1 to 4 times per year    Active Member of [employer] or Organizations: No    Attends Banker Meetings: Never    Marital Status: Never married  Intimate Partner Violence: Not At Risk (04/09/2024)   Humiliation, Afraid, Rape, and Kick questionnaire    Fear of Current or Ex-Partner: No    Emotionally Abused: No    Physically Abused: No    Sexually Abused: No    FH:  Family History  Problem Relation Age of Onset   Stroke Mother    Heart attack Mother    Heart attack Father    Stroke Sister    Heart attack Sister    Heart attack Brother     Stroke Brother    Liver disease Neg Hx    Colon cancer Neg Hx     Past Medical History:  Diagnosis Date   AICD (automatic cardioverter/defibrillator) present    Anxiety    Arthritis    all over (09/17/2017)   Burn    CAD (coronary artery disease)    a. NSTEMI s/p BMS to 1st Diagonal and distal OM2 in 2007; b. STEMI 03/26/12 s/p BMS to RCA; c. NSTEMI 10/2012 : CTO of LCx (unable to open with PCI) and PL branch, mod dz of LAD and diagonal, and preserved LV systolic fxn, Med Rx;  d.  anterior STEMI in 01/2017 with DES to Proximal LAD   CAD in native artery 08/23/2020   Cardiomyopathy EF 35% on cath 06/30/14, new from jan 2015 07/20/2014   Chest pain    CHF (congestive heart failure) (HCC)    Chronic back pain    all over (09/17/2017)   DDD (degenerative disc disease), cervical    Depression    GERD (gastroesophageal reflux disease)    Headache    a few/wk (09/17/2017)   HTN (hypertension)    Hypercholesterolemia    Mental disorder    Myocardial infarction (HCC)    I've had 7 (09/17/2017)   Pulmonary edema    Respiratory failure (HCC)    Sciatic pain    Sleep apnea    Stroke [facility])    a. multiple dating back to 2002; *I''ve had 5; LUE/LLE weaker since (09/17/2017)   Tibia fracture (l) leg   Tobacco abuse    Type 2 diabetes mellitus (HCC)    Unstable angina (HCC)     Current Outpatient Medications  Medication Sig Dispense Refill   acetaminophen  (TYLENOL ) 325 MG tablet Take 650 mg by mouth every 6 (six) hours as needed for mild pain.     albuterol  (VENTOLIN  HFA) 108 (90 Base) MCG/ACT inhaler Inhale 2 puffs into the lungs  every 4 (four) hours as needed for wheezing or shortness of breath. 1 each 3   amiodarone  (PACERONE ) 200 MG tablet Take 1 tablet (200 mg total) by mouth daily. 90 tablet 3   apixaban  (ELIQUIS ) 5 MG TABS tablet Take 1 tablet (5 mg total) by mouth 2 (two) times daily for 6 days. 60 tablet 3   digoxin  (LANOXIN ) 0.125 MG tablet TAKE (1) TABLET BY MOUTH  ONCE DAILY. 30 tablet 11   ezetimibe  (ZETIA ) 10 MG tablet Take 1 tablet (10 mg total) by mouth daily. 30 tablet 3   gabapentin  (NEURONTIN ) 100 MG capsule Take 100 mg by mouth 2 (two) times daily.     JARDIANCE  10 MG TABS tablet Take 1 tablet (10 mg total) by mouth daily before breakfast. 30 tablet 11   levothyroxine  (SYNTHROID ) 100 MCG tablet Take 1 tablet (100 mcg total) by mouth daily at 6 (six) AM. 30 tablet 0   lidocaine  (LIDODERM ) 5 % Place 1 patch onto the skin every 12 (twelve) hours. Leave off 12 hours     metFORMIN  (GLUCOPHAGE ) 500 MG tablet Take 500 mg by mouth daily with breakfast.     nitroGLYCERIN  (NITROSTAT ) 0.4 MG SL tablet PLACE ONE (1) TABLET UNDER TONGUE EVERY 5 MINUTES UP TO (3) DOSES AS NEEDED FOR CHEST PAIN. IF NO RELIEF, CONTACT MD. 25 tablet 3   pantoprazole  (PROTONIX ) 40 MG tablet Take 40 mg by mouth daily.     potassium chloride  (KLOR-CON ) 10 MEQ tablet Take 2 tablets (20 mEq total) by mouth daily. 60 tablet 11   spironolactone  (ALDACTONE ) 25 MG tablet Take 0.5 tablets (12.5 mg total) by mouth daily. 45 tablet 3   Study - COMET-HF - omecamtiv mecarbil 25 mg tablet (2-week run in)(PI-Dotty Gonzalo) Take 1 tablet (25 mg total) by mouth 2 (two) times daily. Take around the same time each day, with or without meals. Swallow whole. Do not chew, crush, or split. 70 tablet 0   thiamine  (VITAMIN B-1) 100 MG tablet Take 1 tablet (100 mg total) by mouth daily. 30 tablet 2   torsemide  (DEMADEX ) 20 MG tablet TAKE (1) TABLET BY MOUTH ONCE DAILY. 30 tablet 11   No current facility-administered medications for this encounter.   Wt Readings from Last 3 Encounters:  07/27/24 65 kg (143 lb 3.2 oz)  06/30/24 66.3 kg (146 lb 1.6 oz)  06/21/24 67.9 kg (149 lb 11.1 oz)   BP 118/72   Pulse 63   Ht 5' 6 (1.676 m)   Wt 65 kg (143 lb 3.2 oz)   SpO2 99%   BMI 23.11 kg/m   PHYSICAL EXAM: General: NAD Neck: No JVD, no thyromegaly or thyroid  nodule.  Lungs: Clear to auscultation bilaterally  with normal respiratory effort. CV: Nondisplaced PMI.  Heart regular S1/S2, no S3/S4, no murmur.  No peripheral edema.  No carotid bruit.  Normal pedal pulses.  Abdomen: Soft, nontender, no hepatosplenomegaly, no distention.  Skin: Intact without lesions or rashes.  Neurologic: Alert and oriented x 3.  Psych: Normal affect. Extremities: No clubbing or cyanosis.  HEENT: Normal.   ASSESSMENT & PLAN:  1. Chronic systolic CHF: Ischemic cardiomyopathy, St Jude ICD. Echo in 10/19 with EF 20-25%.  Echo in 5/22 with EF 25%, normal RV.  Echo 1/23 showed EF 25%. R/LHC in 1/23 at [facility] showed no new CAD, preserved CO and low PCWP suggesting over-diuresis. PYP scan obtained and did not suggest TTR amyloidosis. Echo 4/23 showed EF 20-25% with mild LV dilation, no LV thrombus,  moderate-severe MR with ERO 0.39 cm^2 by PISA, normal RV, PASP 49. RHC in 5/23 showed RA mean 2, PA 49/12 (31), PCWP mean 21 with prominent v waves to 45 suggesting severe MR, Fick CO/CI 3.81/2.18. Echo 7/24 showed EF 20%, slow flow at apex, severe LV dilation, mild RV dysfunction, mod-severe MR, IVC normal. TEE (10/24): LVEF 20-25%, RV normal, mild MR. Echo 6/25 showed EF 25-30%, normal RV, moderate MR. NYHA class I-II, not volume overloaded by exam or [place].  - Continue digoxin  0.125 mg daily. Check digoxin  level today. - Continue torsemide  20 mg daily.   - Restart spironolactone  12.5 mg daily, BMET/BNP today and BMET in 10 day.  - Restart Farxiga 10 mg daily.  - BP does not appear to need midodrine , will stop.  - Reassess BP next appointment, may be able to restart Entresto .  - I will arrange for CPX to assess functional status.  2. CAD: Extensive prior history of CAD.  He has known total occlusion LCx and PLV.  NSTEMI 10/19 with DES to mid LAD.  Coronary angiography in 9/21 showed stable coronary disease, no intervention was done.  Admission 1/23 to [facility] concerning for ACS, however LHC showed patent stents, no new stents placed. No  recent ischemic-type chest pain.  - no ASA with Eliquis  use. - No statin given history of rhabdomyolysis while on statin therapy - Continue Zetia  10 mg daily.  Check lipids today. 3. LV thrombus: Echo 4/23 showed no LV thrombus.  Warfarin was stopped during one of his admissions at [facility]. No LV thrombus on TEE (5/23). He was started on apixaban  in 5/24 due to PE.  No thrombus noted on most recent echocardiograms - Continue Eliquis  5 mg bid. CBC today. 4. Mitral regurgitation: Echo 4/23 showed moderate-severe likely functional MR.  He may benefit from Mitraclip procedure. TEE 5/23 with severe functional MR with restricted posterior leaflet. Elevated PCWP with prominent V waves on RHC in 5/23 suggestive of severe MR.  Patient was seen by structural heart team. He presented for mTEER in 10/24, however TEE showed only mild MR. Procedure aborted.  Last echo in 6/25 showed moderate functional MR.  5. 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  - He is on apixaban .  6. ETOH use: Still drinking some.  - I recommended complete cessation.  7. NSVT: Now off Coreg  with marginal output and soft BP.  - Continue amiodarone  at 200 mg daily. Recent LFTs and TSH normal. He will need a regular eye exam. 8. Cocaine abuse: He was cocaine positive during 2/24 admission.  Denies current use.  9. PE: Diagnosed in 5/24, on apixaban  (continue long-term). ? Compliance and new LLL PE in 6/25.  - Continue Eliquis  5 mg bid. CBC today.  Follow up in 3 wks with APP  I spent 31 minutes reviewing records, interviewing/examining patient, and managing orders.     Ezra Shuck 07/27/2024

## 2024-07-28 NOTE — Research (Signed)
 RUN IN END OF TREATMENT/FOLLOW UP VISIT     Date of visit: 29-Sept-2025  Potential Endpoint: NO   Hospitalization> 24 hours since last visit No     Patient states he is doing well since last visit. Thanked him for participating in study.    [place] Mayanna Garlitz, Research Coordinator 07/27/2024

## 2024-07-29 NOTE — Progress Notes (Signed)
 EPIC Encounter for ICM Monitoring  Patient Name: Daniel Li is a 68 y.o. male Date: 07/29/2024 Primary Care Physican: Rosamond Leta NOVAK, MD Primary Cardiologist: Emi Sh[facility] Electrophysiologist: Mealor 08/10/2022 Office Weight: 140 lbs                11/05/2023 Weight: 150 lbs   03/12/2024 Office Weight: 149 lbs    05/12/2024 Office Weight: 147 lbs      07/27/2024 Office Weight: 143.3 lbs                         Transmission results reviewed.    Since 06/22/2024 ICM Remote Transmission:  CorVue thoracic impedance suggesting normal fluid levels.   Prescribed dosage:  Torsemide  20 mg Take 1 tablet(s) (20 mg total) by mouth daily Potassium 10 mEq take 2 tablets (20 mEq total) by mouth daily Spironolactone  25 mg take 0.5 tablet (12.5 mg total) daily   Labs: 07/27/2024 Creatinine 1.28, BUN 13, Potassium 4.1, Sodium 138, GFR >60 06/21/2024 Creatinine 1.27, BUN 14, Potassium 3.4, Sodium 142, GFR >60 06/08/2024 Creatinine 1.13, BUN 15, Potassium 3.9, Sodium 143 05/12/2024 Creatinine 1.36, BUN 17, Potassium 4.0, Sodium 138, GFR 57  04/14/2024 Creatinine 1.10, BUN 16, Potassium 3.9, Sodium 141, GFR >60  04/12/2024 Creatinine 1.04, BUN 15, Potassium 3.8, Sodium 138, GFR >60  04/10/2024 Creatinine 1.11, BUN 12, Potassium 4.0, Sodium 141, GFR >60 04/09/2024 Creatinine 1.25, BUN 12, Potassium 3.0, Sodium 140, GFR >60  04/06/2024 Creatinine 1.65, BUN 8,   Potassium 4.2, Sodium 143, GFR 45  A complete set of results can be found in Results Review.   Recommendations:  Any recommendations given at 07/27/2024 HF clinic office visit.    Follow-up plan: ICM clinic phone appointment on 08/31/2024.    91 day device clinic remote transmission 08/10/2024.     EP/Cardiology Office Visits:    08/17/2024 with HF Clinic.    Recall 03/25/2025 with EP APP.   Copy of ICM check sent to Dr. Nancey.   Remote monitoring is medically necessary for Heart Failure Management.    90 day Daily Thoracic  Impedance ICM trend: 04/28/2024 through 07/27/2024.    12-14 Month Thoracic Impedance ICM trend:     [name], RN 07/29/2024 2:22 PM

## 2024-07-31 NOTE — Telephone Encounter (Signed)
 Called patient and spoke with his caregiver per Dr. Rolan with following lab results:  Repeat digoxin  level as a trough.  Asked her to have patient hold his Digoxin  the morning of his next appointment. She verbalized understanding of same. Clinic appointment note updated requesting Digoxin  trough.

## 2024-08-06 NOTE — Progress Notes (Signed)
 Remote ICD Transmission

## 2024-08-10 ENCOUNTER — Other Ambulatory Visit (HOSPITAL_COMMUNITY): Payer: Self-pay | Admitting: Physician Assistant

## 2024-08-10 ENCOUNTER — Ambulatory Visit (INDEPENDENT_AMBULATORY_CARE_PROVIDER_SITE_OTHER): Payer: Medicare Other

## 2024-08-10 DIAGNOSIS — I4729 Other ventricular tachycardia: Secondary | ICD-10-CM | POA: Diagnosis not present

## 2024-08-10 LAB — CUP PACEART REMOTE DEVICE CHECK
Battery Remaining Longevity: 43 mo
Battery Remaining Percentage: 41 %
Battery Voltage: 2.93 V
Brady Statistic RV Percent Paced: 1 %
Date Time Interrogation Session: 20251013020018
HighPow Impedance: 63 Ohm
HighPow Impedance: 63 Ohm
Implantable Lead Connection Status: 753985
Implantable Lead Implant Date: 20181120
Implantable Lead Location: 753860
Implantable Pulse Generator Implant Date: 20181120
Lead Channel Impedance Value: 350 Ohm
Lead Channel Pacing Threshold Amplitude: 1.25 V
Lead Channel Pacing Threshold Pulse Width: 0.5 ms
Lead Channel Sensing Intrinsic Amplitude: 11.9 mV
Lead Channel Setting Pacing Amplitude: 2.5 V
Lead Channel Setting Pacing Pulse Width: 0.5 ms
Lead Channel Setting Sensing Sensitivity: 0.5 mV
Pulse Gen Serial Number: 9786940
Zone Setting Status: 755011

## 2024-08-11 NOTE — Progress Notes (Signed)
 Remote ICD Transmission

## 2024-08-12 ENCOUNTER — Ambulatory Visit (HOSPITAL_COMMUNITY)

## 2024-08-12 ENCOUNTER — Ambulatory Visit (HOSPITAL_COMMUNITY)
Admission: RE | Admit: 2024-08-12 | Discharge: 2024-08-12 | Disposition: A | Discharge status: Home / Self Care | Source: Ambulatory Visit | Attending: Internal Medicine | Admitting: Internal Medicine

## 2024-08-12 ENCOUNTER — Ambulatory Visit (HOSPITAL_COMMUNITY)
Admission: RE | Admit: 2024-08-12 | Discharge: 2024-08-12 | Disposition: A | Source: Ambulatory Visit | Attending: Family Medicine | Admitting: Family Medicine

## 2024-08-12 ENCOUNTER — Encounter (HOSPITAL_COMMUNITY): Payer: Self-pay

## 2024-08-12 ENCOUNTER — Telehealth: Payer: Self-pay

## 2024-08-12 ENCOUNTER — Ambulatory Visit (HOSPITAL_COMMUNITY): Payer: Self-pay | Admitting: Family Medicine

## 2024-08-12 DIAGNOSIS — I513 Intracardiac thrombosis, not elsewhere classified: Secondary | ICD-10-CM | POA: Diagnosis not present

## 2024-08-12 DIAGNOSIS — I110 Hypertensive heart disease with heart failure: Secondary | ICD-10-CM | POA: Diagnosis not present

## 2024-08-12 DIAGNOSIS — I2510 Atherosclerotic heart disease of native coronary artery without angina pectoris: Secondary | ICD-10-CM | POA: Insufficient documentation

## 2024-08-12 DIAGNOSIS — Z7901 Long term (current) use of anticoagulants: Secondary | ICD-10-CM | POA: Diagnosis not present

## 2024-08-12 DIAGNOSIS — F1411 Cocaine abuse, in remission: Secondary | ICD-10-CM | POA: Diagnosis not present

## 2024-08-12 DIAGNOSIS — I340 Nonrheumatic mitral (valve) insufficiency: Secondary | ICD-10-CM | POA: Diagnosis not present

## 2024-08-12 DIAGNOSIS — Z7984 Long term (current) use of oral hypoglycemic drugs: Secondary | ICD-10-CM | POA: Diagnosis not present

## 2024-08-12 DIAGNOSIS — Z955 Presence of coronary angioplasty implant and graft: Secondary | ICD-10-CM | POA: Insufficient documentation

## 2024-08-12 DIAGNOSIS — Z95810 Presence of automatic (implantable) cardiac defibrillator: Secondary | ICD-10-CM | POA: Insufficient documentation

## 2024-08-12 DIAGNOSIS — F17290 Nicotine dependence, other tobacco product, uncomplicated: Secondary | ICD-10-CM | POA: Insufficient documentation

## 2024-08-12 DIAGNOSIS — E119 Type 2 diabetes mellitus without complications: Secondary | ICD-10-CM | POA: Diagnosis not present

## 2024-08-12 DIAGNOSIS — I5022 Chronic systolic (congestive) heart failure: Secondary | ICD-10-CM | POA: Insufficient documentation

## 2024-08-12 DIAGNOSIS — F17210 Nicotine dependence, cigarettes, uncomplicated: Secondary | ICD-10-CM | POA: Diagnosis not present

## 2024-08-12 DIAGNOSIS — Z86711 Personal history of pulmonary embolism: Secondary | ICD-10-CM | POA: Insufficient documentation

## 2024-08-12 DIAGNOSIS — I4729 Other ventricular tachycardia: Secondary | ICD-10-CM | POA: Insufficient documentation

## 2024-08-12 DIAGNOSIS — I517 Cardiomegaly: Secondary | ICD-10-CM | POA: Diagnosis not present

## 2024-08-12 DIAGNOSIS — R059 Cough, unspecified: Secondary | ICD-10-CM | POA: Diagnosis not present

## 2024-08-12 DIAGNOSIS — I255 Ischemic cardiomyopathy: Secondary | ICD-10-CM | POA: Insufficient documentation

## 2024-08-12 DIAGNOSIS — E785 Hyperlipidemia, unspecified: Secondary | ICD-10-CM | POA: Insufficient documentation

## 2024-08-12 DIAGNOSIS — I5042 Chronic combined systolic (congestive) and diastolic (congestive) heart failure: Secondary | ICD-10-CM

## 2024-08-12 DIAGNOSIS — Z8673 Personal history of transient ischemic attack (TIA), and cerebral infarction without residual deficits: Secondary | ICD-10-CM | POA: Diagnosis not present

## 2024-08-12 DIAGNOSIS — F1090 Alcohol use, unspecified, uncomplicated: Secondary | ICD-10-CM | POA: Insufficient documentation

## 2024-08-12 DIAGNOSIS — R0602 Shortness of breath: Secondary | ICD-10-CM | POA: Diagnosis not present

## 2024-08-12 DIAGNOSIS — Z79899 Other long term (current) drug therapy: Secondary | ICD-10-CM | POA: Diagnosis not present

## 2024-08-12 DIAGNOSIS — I252 Old myocardial infarction: Secondary | ICD-10-CM | POA: Insufficient documentation

## 2024-08-12 DIAGNOSIS — G4733 Obstructive sleep apnea (adult) (pediatric): Secondary | ICD-10-CM | POA: Diagnosis not present

## 2024-08-12 DIAGNOSIS — F1011 Alcohol abuse, in remission: Secondary | ICD-10-CM

## 2024-08-12 LAB — COMPREHENSIVE METABOLIC PANEL WITH GFR
ALT: 25 U/L (ref 0–44)
AST: 34 U/L (ref 15–41)
Albumin: 4 g/dL (ref 3.5–5.0)
Alkaline Phosphatase: 55 U/L (ref 38–126)
Anion gap: 12 (ref 5–15)
BUN: 14 mg/dL (ref 8–23)
CO2: 27 mmol/L (ref 22–32)
Calcium: 9.7 mg/dL (ref 8.9–10.3)
Chloride: 99 mmol/L (ref 98–111)
Creatinine, Ser: 1.42 mg/dL — ABNORMAL HIGH (ref 0.61–1.24)
GFR, Estimated: 54 mL/min — ABNORMAL LOW (ref >60)
Glucose, Bld: 105 mg/dL — ABNORMAL HIGH (ref 70–99)
Potassium: 4.8 mmol/L (ref 3.5–5.1)
Sodium: 138 mmol/L (ref 135–145)
Total Bilirubin: 0.7 mg/dL (ref 0.0–1.2)
Total Protein: 7.3 g/dL (ref 6.5–8.1)

## 2024-08-12 LAB — BRAIN NATRIURETIC PEPTIDE: B Natriuretic Peptide: 271.4 pg/mL — ABNORMAL HIGH (ref 0.0–100.0)

## 2024-08-12 LAB — DIGOXIN LEVEL: Digoxin Level: 1.4 ng/mL (ref 0.8–2.0)

## 2024-08-12 LAB — ETHANOL: Alcohol, Ethyl (B): <15 mg/dL (ref <15)

## 2024-08-12 NOTE — Progress Notes (Signed)
 ADVANCED HF CLINIC NOTE  PCP: Rosamond Leta NOVAK, MD HF Cardiologist: Ezra Shuck, MD  HPI: Daniel Li is a 68 y.o. male with history of CAD, chronic systolic CHF/ischemic cardiomyopathy s/p St. Jude ICD in 2018, HTN, HLD, tobacco abuse, hx ETOH abuse, mitral regurgitation, OSA, hx CVA, hx PE 05/24, DM.  He had NSTEMI in 2007 s/p BMS to D1 and OM2. Later had STEMI in 2013 s/p BMS to RCA. Had NSTEMI in 2014 and found to have CTO of LCX treated medically. He had anterior STEMI in 04/18 s/p DES to LAD. Had DES to mLAD 10/19 in setting of NSTEMI. Last LHC at [facility] in 2023: patent stents in p LAD and D1, moderate diffuse disease mLAD, LCX occluded proximally and fills via collaterals from LAD, patent pRCA stent, 50% PLV, 50% PDA. CAD treated medically.   EF has been in the 20-25% range by echo since 2018.   Presented for mTEER 10/24, however found no significant MR on TEE, despite multiple TTEs suggestive otherwise. Procedure aborted and he was discharged.   Admitted 03/12/24 with left arm weakness, dizziness, and near syncope. MRI and CT head without acute abnormalities. L arm weakness form previous CVA. At discharge, jardiance , entresto , torsemide , and spironolactone  held. Discharge back Assisted Living.   Admitted 6/25 with acute PE. CTA chest showed acute LLL PE. ETOH level 68. He had been off his Eliquis  x 2 months. LE dopplers negative for DVT. Echo showed EF 25-30%, normal RV, moderate MR. AC restarted. GDMT limited by low BP and needed midodrine . He was discharged back to ALF, weight 146 lbs.  Follow up 9/25, CPX arranged.  Today he returns for an acute visit. He was in middle of CPX when he became dizzy and complained of left neck pain. BP 110-100, HR stable and telemetry without acute changes. Exercise physiologist endorsed his color paled. He is not SOB, but feels dizzy and weak. He has been coughing, no fever or chills. Denies palpitations, abnormal bleeding, CP, edema, or  PND/Orthopnea. Taking all medications. He says he has not been drinking recently.    Labs (6/23): K 4.4, creatinine 1.46, LFTs normal, TSH normal, Lp (a) 232 Labs (2/24): LDL 45, BNP 171, K 4.3, creatinine 1.33 Labs (4/24): K 4.1, SCr 1.1 Labs (5/24): K 5.2, creatinine 1.12 Labs (7/24): K 4.7, creatininine 1.31 Labs (8/24): LDL 144, LFTs normal, TSH normal Labs (10/24): K 4, creatinine 1.21 Labs (5/25): LDL 44 Labs (6/25): K 3.9, creatinine 1.10, normal LFTs, TSH 11.6 Labs (7/25): TSH normal Labs (9/25): K 4.1, creatinine 1.28, BNP 223, LFTs normal, LDL 89   St Jude device interrogation (personally reviewed): will ask device RN to send interrogation   PMH: 1. OSA: Supposed to be using CPAP.  2. HTN 3. CAD: Long history.  - NSTEMI 2007 with BMS D1 and OM2. - STEMI 5/13 with BMS RCA - NSTEMI 1/14 with CTO LCx and PLV, unable to open LCx.  - STEMI 4/18 with LHC showing old TO LCx with collaterals, old TO PLV, 70% ostial D1, totally occluded proximal LAD with some collaterals => DES to LAD.  - LHC 8/18 with old TO LCx, old TO PLV, 60% mLAD, patent LAD stent.  - LHC 4/19 with totally occluded PLV, totally occluded LCx, patent LAD and D1 stents, 50% mid LAD.  - NSTEMI 10/19. LHC with totally occluded PLV, totally occluded LCx, patent proximal LAD and D1 stents, 80% mid LAD stenosis => DES to mid LAD.  -  LHC (9/21): occluded PLV and proximal LCx with collaterals, patent LAD, 60% D2, 60% PDA.  No intervention.  - LHC (1/23, [facility]): significant 3-vessel disease with patent stents; no further stents were placed 4. Depression 5. Type II DM 6. Hyperlipidemia: History of rhabdomyolysis with statins, he is on Zetia .  7. Chronic systolic CHF: Ischemic cardiomyopathy.  Echo from 9/16 showed improvement in EF to 55-60%.  St Jude ICD.  - Echo (4/18) with EF 20-25%, dyskinetic apex, moderate-severe MR, severe LAE.  - Echo (8/18) with EF 20-25%, moderate to severe MR - Echo (4/19) with EF 20-25%  -  Echo (10/19) with EF 20-25%, no LV thrombus.  - RHC (10/19): mean RA 4, PA 22/7, mean PCWP 7, CI 2.3 - RHC (9/21): mean RA 1, PA 18/5, mean PCWP 7, CI 2.12 (F)/2.17(T) - Echo (5/22): EF 25% with mild LV dilation, no LV thrombus, normal RV, mild MR. - RHC (1/23): CO 3.0 and low PWCP at 7 mmHg - Echo (1/23): EF 25% - Echo (4/23): EF 20-25% with mild LV dilation, no LV thrombus, moderate-severe MR with ERO 0.39 cm^2 by PISA, normal RV, PASP 49.  - Echo (2/24): EF 20%, severe LV dilation, slow flow at apex w/o frank clot, RV normal, at least moderate MR, IVC normal.  - Echo (7/24): EF 20%, slow flow at apex, severe LV dilation, mild RV dysfunction, mod-severe MR, IVC normal.  - TEE (10/24): LVEF 20-25%, RV normal, mild MR  - Echo (6/25): EF 25-30%, normal RV, moderate MR 8. PAD: - ABIs (4/18) with R ABI of 0.84 suggestive of mild disease.  L ABI of 1.27 (Normal flow at rest) - ABIs (5/19): abnormal TBI on right.  - ABIs (10/21): Normal 9. LV thrombus 10. H/o CVA 11. Mitral regurgitation: Likely functional. Echo in 4/23 with moderate-severe MR. EE (5/23): EF 20-25%, RV mildly reduced, no LAA, severe MR with no mitral stenosis, MV mean gradient 2.0 mmHg - TEE (5/23): EF 20-25%, mild RV dysfunction, severe MR.  - Echo (7/24): moderate-severe MR - TEE (10/24): LVEF 20-25%, RV normal, mild MR  12. PE: Diagnosed [facility] in 5/24. 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   ROS: All systems negative except as listed in HPI, PMH and Problem List.  SH:  Social History   Socioeconomic History   Marital status: Single    Spouse name: Not on file   Number of children: 0   Years of education: 9   Highest education level: 9th grade  Occupational History   Not on file  Tobacco Use   Smoking status: Every Day    Current packs/day: 0.50    Average packs/day: 0.5 packs/day for 47.1 years (23.5 ttl pk-yrs)    Types: Cigarettes, Cigars    Start date: 07/21/1977   Smokeless tobacco: Never  Vaping Use   Vaping  status: Never Used  Substance and Sexual Activity   Alcohol use: Yes    Comment: 09/17/2017 nothing since 05/2017   Drug use: Not Currently    Comment: previously incarcerated for drug related offense.   Sexual activity: Not Currently  Other Topics Concern   Not on file  Social History Narrative   Not on file   Social Drivers of Health   Financial Resource Strain: Low Risk  (02/25/2023)   Received from [facility]   Overall Financial Resource Strain (CARDIA)    Difficulty of Paying Living Expenses: Not hard at all  Food Insecurity: No Food Insecurity (04/09/2024)   Hunger Vital Sign  Worried About Programme researcher, broadcasting/film/video in the Last Year: Never true    Ran Out of Food in the Last Year: Never true  Transportation Needs: No Transportation Needs (04/09/2024)   PRAPARE - Administrator, Civil Service (Medical): No    Lack of Transportation (Non-Medical): No  Recent Concern: Transportation Needs - Unmet Transportation Needs (01/22/2024)   PRAPARE - Administrator, Civil Service (Medical): Yes    Lack of Transportation (Non-Medical): Yes  Physical Activity: Not on file  Stress: Not on file  Social Connections: Moderately Isolated (04/09/2024)   Social Connection and Isolation Panel    Frequency of Communication with Friends and Family: More than three times a week    Frequency of Social Gatherings with Friends and Family: More than three times a week    Attends Religious Services: 1 to 4 times per year    Active Member of [employer] or Organizations: No    Attends Banker Meetings: Never    Marital Status: Never married  Intimate Partner Violence: Not At Risk (04/09/2024)   Humiliation, Afraid, Rape, and Kick questionnaire    Fear of Current or Ex-Partner: No    Emotionally Abused: No    Physically Abused: No    Sexually Abused: No    FH:  Family History  Problem Relation Age of Onset   Stroke Mother    Heart attack Mother    Heart attack  Father    Stroke Sister    Heart attack Sister    Heart attack Brother    Stroke Brother    Liver disease Neg Hx    Colon cancer Neg Hx     Past Medical History:  Diagnosis Date   AICD (automatic cardioverter/defibrillator) present    Anxiety    Arthritis    all over (09/17/2017)   Burn    CAD (coronary artery disease)    a. NSTEMI s/p BMS to 1st Diagonal and distal OM2 in 2007; b. STEMI 03/26/12 s/p BMS to RCA; c. NSTEMI 10/2012 : CTO of LCx (unable to open with PCI) and PL branch, mod dz of LAD and diagonal, and preserved LV systolic fxn, Med Rx;  d.  anterior STEMI in 01/2017 with DES to Proximal LAD   CAD in native artery 08/23/2020   Cardiomyopathy EF 35% on cath 06/30/14, new from jan 2015 07/20/2014   Chest pain    CHF (congestive heart failure) (HCC)    Chronic back pain    all over (09/17/2017)   DDD (degenerative disc disease), cervical    Depression    GERD (gastroesophageal reflux disease)    Headache    a few/wk (09/17/2017)   HTN (hypertension)    Hypercholesterolemia    Mental disorder    Myocardial infarction (HCC)    I've had 7 (09/17/2017)   Pulmonary edema    Respiratory failure (HCC)    Sciatic pain    Sleep apnea    Stroke [facility])    a. multiple dating back to 2002; *I''ve had 5; LUE/LLE weaker since (09/17/2017)   Tibia fracture (l) leg   Tobacco abuse    Type 2 diabetes mellitus (HCC)    Unstable angina (HCC)     Current Outpatient Medications  Medication Sig Dispense Refill   acetaminophen  (TYLENOL ) 325 MG tablet Take 650 mg by mouth every 6 (six) hours as needed for mild pain.     albuterol  (VENTOLIN  HFA) 108 (90 Base) MCG/ACT inhaler Inhale 2  puffs into the lungs every 4 (four) hours as needed for wheezing or shortness of breath. 1 each 3   amiodarone  (PACERONE ) 200 MG tablet TAKE (1) TABLET BY MOUTH ONCE DAILY. 30 tablet 10   apixaban  (ELIQUIS ) 5 MG TABS tablet Take 1 tablet (5 mg total) by mouth 2 (two) times daily for 6 days. 60 tablet  3   digoxin  (LANOXIN ) 0.125 MG tablet TAKE (1) TABLET BY MOUTH ONCE DAILY. 30 tablet 11   ezetimibe  (ZETIA ) 10 MG tablet Take 1 tablet (10 mg total) by mouth daily. 30 tablet 3   gabapentin  (NEURONTIN ) 100 MG capsule Take 100 mg by mouth 2 (two) times daily.     JARDIANCE  10 MG TABS tablet Take 1 tablet (10 mg total) by mouth daily before breakfast. 30 tablet 11   levothyroxine  (SYNTHROID ) 100 MCG tablet Take 1 tablet (100 mcg total) by mouth daily at 6 (six) AM. 30 tablet 0   lidocaine  (LIDODERM ) 5 % Place 1 patch onto the skin every 12 (twelve) hours. Leave off 12 hours     metFORMIN  (GLUCOPHAGE ) 500 MG tablet Take 500 mg by mouth daily with breakfast.     nitroGLYCERIN  (NITROSTAT ) 0.4 MG SL tablet PLACE ONE (1) TABLET UNDER TONGUE EVERY 5 MINUTES UP TO (3) DOSES AS NEEDED FOR CHEST PAIN. IF NO RELIEF, CONTACT MD. 25 tablet 3   pantoprazole  (PROTONIX ) 40 MG tablet Take 40 mg by mouth daily.     potassium chloride  (KLOR-CON ) 10 MEQ tablet Take 2 tablets (20 mEq total) by mouth daily. 60 tablet 11   spironolactone  (ALDACTONE ) 25 MG tablet Take 0.5 tablets (12.5 mg total) by mouth daily. 45 tablet 3   Study - COMET-HF - omecamtiv mecarbil 25 mg tablet (2-week run in)(PI-[place]) Take 1 tablet (25 mg total) by mouth 2 (two) times daily. Take around the same time each day, with or without meals. Swallow whole. Do not chew, crush, or split. 70 tablet 0   thiamine  (VITAMIN B-1) 100 MG tablet Take 1 tablet (100 mg total) by mouth daily. 30 tablet 2   torsemide  (DEMADEX ) 20 MG tablet TAKE (1) TABLET BY MOUTH ONCE DAILY. 30 tablet 11   No current facility-administered medications for this encounter.   Wt Readings from Last 3 Encounters:  07/27/24 65 kg (143 lb 3.2 oz)  06/30/24 66.3 kg (146 lb 1.6 oz)  06/21/24 67.9 kg (149 lb 11.1 oz)   There were no vitals taken for this visit.  PHYSICAL EXAM: General:  NAD. No resp difficulty, fatigued-appearing HEENT: Normal Neck: Supple. No JVD. Cor: Regular  rate & rhythm. No rubs, gallops or murmurs. Lungs: Rhonchi in upper lobes Abdomen: Soft, nontender, nondistended.  Extremities: No cyanosis, clubbing, rash, edema Neuro: Alert & oriented x 3, moves all 4 extremities w/o difficulty. Affect pleasant.  ASSESSMENT & PLAN:  1. Chronic systolic CHF: Ischemic cardiomyopathy, St Jude ICD. Echo in 10/19 with EF 20-25%.  Echo in 5/22 with EF 25%, normal RV.  Echo 1/23 showed EF 25%. R/LHC in 1/23 at [facility] showed no new CAD, preserved CO and low PCWP suggesting over-diuresis. PYP scan obtained and did not suggest TTR amyloidosis. Echo 4/23 showed EF 20-25% with mild LV dilation, no LV thrombus, moderate-severe MR with ERO 0.39 cm^2 by PISA, normal RV, PASP 49. RHC in 5/23 showed RA mean 2, PA 49/12 (31), PCWP mean 21 with prominent v waves to 45 suggesting severe MR, Fick CO/CI 3.81/2.18. Echo 7/24 showed EF 20%, slow flow at apex, severe  LV dilation, mild RV dysfunction, mod-severe MR, IVC normal. TEE (10/24): LVEF 20-25%, RV normal, mild MR. Echo 6/25 showed EF 25-30%, normal RV, moderate MR. Became symptomatic during CPX today, sounds orthostatic, sBP in 100s. May be on the dry side. - I asked him to hold all GDMT for today. Check CMET, BNP, dig level and Ethanol level today  - I will ask device RN to send interrogation  - Re-schedule CPX. - With cough, will send for CXR to rule out PNA  2. CAD: Extensive prior history of CAD.  He has known total occlusion LCx and PLV.  NSTEMI 10/19 with DES to mid LAD.  Coronary angiography in 9/21 showed stable coronary disease, no intervention was done.  Admission 1/23 to [facility] concerning for ACS, however LHC showed patent stents, no new stents placed. No recent ischemic-type chest pain.  - no ASA with Eliquis  use. - No statin given history of rhabdomyolysis while on statin therapy - Continue Zetia  10 mg daily.   3. LV thrombus: Echo 4/23 showed no LV thrombus.  Warfarin was stopped during one of his admissions at [facility]. No LV  thrombus on TEE (5/23). He was started on apixaban  in 5/24 due to PE.  No thrombus noted on most recent echocardiograms - Continue Eliquis  5 mg bid. No bleeding issues. Recent CBC stable 4. Mitral regurgitation: Echo 4/23 showed moderate-severe likely functional MR.  He may benefit from Mitraclip procedure. TEE 5/23 with severe functional MR with restricted posterior leaflet. Elevated PCWP with prominent V waves on RHC in 5/23 suggestive of severe MR.  Patient was seen by structural heart team. He presented for mTEER in 10/24, however TEE showed only mild MR. Procedure aborted.  Last echo in 6/25 showed moderate functional MR.  5. 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  - He is on apixaban .  6. ETOH use: Says he has not had any ETOH.   - I recommended complete cessation.  7. NSVT: Now off Coreg  with marginal output and soft BP.  - Continue amiodarone  at 200 mg daily. Recent LFTs and TSH normal. He will need a regular eye exam. Repeat CMET today 8. Cocaine abuse: He was cocaine positive during 2/24 admission.  Denies current use.  9. PE: Diagnosed in 5/24, on apixaban  (continue long-term). ? Compliance and new LLL PE in 6/25.  - Continue Eliquis  5 mg bid.    Follow up next week with APP, as scheduled.  Harlene HERO [facility] FNP-BC 08/12/2024

## 2024-08-12 NOTE — Telephone Encounter (Signed)
 ICM Call to patient.  Explained [name], NP at [facility] clinic requested he send a remote transmission for review since he expressed not feeling well during CPX today.  He reports being dizzy and little SOB during test but dizziness has improved some since the test has been completed.   He will send report once he gets home in about 30 minutes.  Will send to [name], NP for review once report is received.

## 2024-08-12 NOTE — Telephone Encounter (Signed)
 Received device remote transmission.   No abnormalities noted.    CorVue thoracic impedance was suggesting fluid accumulation starting 08/07/2024 but returned to normal 08/11/2024.  Sent to [name], NP at [facility] clinic as requested.

## 2024-08-12 NOTE — Patient Instructions (Signed)
 HOLD YOUR DIGOXIN  AND SPIRONOLACTONE  TILL WE GET YOUR BLOOD WORK BACK.  Labs done today, your results will be available in MyChart, we will contact you for abnormal readings.  Your provider has ordered a chest X-ray for you.  KEEP FOLLOW UP AS SCHEDULED.  If you have any questions or concerns before your next appointment please send us  a message through [place] or call our office at [phone].    TO LEAVE A MESSAGE FOR THE NURSE SELECT OPTION 2, PLEASE LEAVE A MESSAGE INCLUDING: YOUR NAME DATE OF BIRTH CALL BACK NUMBER REASON FOR CALL**this is important as we prioritize the call backs  YOU WILL RECEIVE A CALL BACK THE SAME DAY AS LONG AS YOU CALL BEFORE 4:00 PM  At the [facility], you and your health needs are our priority. As part of our continuing mission to provide you with exceptional heart care, we have created designated Provider Care Teams. These Care Teams include your primary Cardiologist (physician) and Advanced Practice Providers (APPs- Physician Assistants and Nurse Practitioners) who all work together to provide you with the care you need, when you need it.   You may see any of the following providers on your designated Care Team at your next follow up: Dr Toribio Fuel Dr Ezra Shuck Dr. Ria Commander Dr. Morene Brownie Amy Lenetta, NP Caffie Shed, [place] [facility] [place], [place] Beckey Coe, NP [place] Lee, NP Ellouise Class, NP Tinnie Redman, PharmD Jaun Bash, PharmD   Please be sure to bring in all your medications bottles to every appointment.    Thank you for choosing [facility]

## 2024-08-13 ENCOUNTER — Ambulatory Visit (HOSPITAL_COMMUNITY)

## 2024-08-13 NOTE — Telephone Encounter (Addendum)
 Called assisted living. They are aware that he is to hold his digoxin  on Monday 10/20.  ----- Message from Harlene CHRISTELLA Gainer sent at 08/12/2024  4:11 PM EDT ----- Dig level elevated. Needs a digoxin  trough this week. Please make sure he knows to HOLD digoxin  before lab draw ----- Message ----- From: Interface, Lab In [place] Sent: 08/12/2024   2:17 PM EDT To: Harlene CHRISTELLA Gainer, FNP

## 2024-08-14 DIAGNOSIS — I5042 Chronic combined systolic (congestive) and diastolic (congestive) heart failure: Secondary | ICD-10-CM | POA: Diagnosis not present

## 2024-08-17 ENCOUNTER — Ambulatory Visit (HOSPITAL_COMMUNITY)
Admission: RE | Admit: 2024-08-17 | Discharge: 2024-08-17 | Disposition: A | Discharge status: Home / Self Care | Source: Ambulatory Visit | Attending: Family Medicine | Admitting: Family Medicine

## 2024-08-17 ENCOUNTER — Encounter (HOSPITAL_COMMUNITY): Payer: Self-pay

## 2024-08-17 ENCOUNTER — Ambulatory Visit (HOSPITAL_COMMUNITY): Payer: Self-pay | Admitting: Family Medicine

## 2024-08-17 VITALS — BP 102/70 | HR 77 | Wt 141.4 lb

## 2024-08-17 DIAGNOSIS — I255 Ischemic cardiomyopathy: Secondary | ICD-10-CM | POA: Diagnosis not present

## 2024-08-17 DIAGNOSIS — Z7984 Long term (current) use of oral hypoglycemic drugs: Secondary | ICD-10-CM | POA: Insufficient documentation

## 2024-08-17 DIAGNOSIS — Z8673 Personal history of transient ischemic attack (TIA), and cerebral infarction without residual deficits: Secondary | ICD-10-CM | POA: Insufficient documentation

## 2024-08-17 DIAGNOSIS — I513 Intracardiac thrombosis, not elsewhere classified: Secondary | ICD-10-CM | POA: Diagnosis not present

## 2024-08-17 DIAGNOSIS — I340 Nonrheumatic mitral (valve) insufficiency: Secondary | ICD-10-CM | POA: Insufficient documentation

## 2024-08-17 DIAGNOSIS — E119 Type 2 diabetes mellitus without complications: Secondary | ICD-10-CM | POA: Diagnosis not present

## 2024-08-17 DIAGNOSIS — I5022 Chronic systolic (congestive) heart failure: Secondary | ICD-10-CM | POA: Insufficient documentation

## 2024-08-17 DIAGNOSIS — F1011 Alcohol abuse, in remission: Secondary | ICD-10-CM

## 2024-08-17 DIAGNOSIS — Z87891 Personal history of nicotine dependence: Secondary | ICD-10-CM | POA: Insufficient documentation

## 2024-08-17 DIAGNOSIS — Z79899 Other long term (current) drug therapy: Secondary | ICD-10-CM | POA: Diagnosis not present

## 2024-08-17 DIAGNOSIS — Z7901 Long term (current) use of anticoagulants: Secondary | ICD-10-CM | POA: Insufficient documentation

## 2024-08-17 DIAGNOSIS — G4733 Obstructive sleep apnea (adult) (pediatric): Secondary | ICD-10-CM | POA: Diagnosis not present

## 2024-08-17 DIAGNOSIS — Z95810 Presence of automatic (implantable) cardiac defibrillator: Secondary | ICD-10-CM | POA: Diagnosis not present

## 2024-08-17 DIAGNOSIS — I4729 Other ventricular tachycardia: Secondary | ICD-10-CM | POA: Diagnosis not present

## 2024-08-17 DIAGNOSIS — Z86711 Personal history of pulmonary embolism: Secondary | ICD-10-CM | POA: Diagnosis not present

## 2024-08-17 DIAGNOSIS — I252 Old myocardial infarction: Secondary | ICD-10-CM | POA: Diagnosis not present

## 2024-08-17 DIAGNOSIS — I2510 Atherosclerotic heart disease of native coronary artery without angina pectoris: Secondary | ICD-10-CM | POA: Insufficient documentation

## 2024-08-17 DIAGNOSIS — E785 Hyperlipidemia, unspecified: Secondary | ICD-10-CM | POA: Insufficient documentation

## 2024-08-17 DIAGNOSIS — I110 Hypertensive heart disease with heart failure: Secondary | ICD-10-CM | POA: Insufficient documentation

## 2024-08-17 DIAGNOSIS — F1090 Alcohol use, unspecified, uncomplicated: Secondary | ICD-10-CM | POA: Insufficient documentation

## 2024-08-17 LAB — BASIC METABOLIC PANEL WITH GFR
Anion gap: 13 (ref 5–15)
BUN: 12 mg/dL (ref 8–23)
CO2: 25 mmol/L (ref 22–32)
Calcium: 9.7 mg/dL (ref 8.9–10.3)
Chloride: 99 mmol/L (ref 98–111)
Creatinine, Ser: 1.36 mg/dL — ABNORMAL HIGH (ref 0.61–1.24)
GFR, Estimated: 57 mL/min — ABNORMAL LOW (ref >60)
Glucose, Bld: 125 mg/dL — ABNORMAL HIGH (ref 70–99)
Potassium: 4.2 mmol/L (ref 3.5–5.1)
Sodium: 137 mmol/L (ref 135–145)

## 2024-08-17 LAB — DIGOXIN LEVEL: Digoxin Level: 0.6 ng/mL — ABNORMAL LOW (ref 0.8–2.0)

## 2024-08-17 MED ORDER — TORSEMIDE 20 MG PO TABS: 20.0000 mg | ORAL_TABLET | ORAL | 11 refills | Status: DC

## 2024-08-17 MED ORDER — POTASSIUM CHLORIDE ER 10 MEQ PO TBCR: 20.0000 meq | EXTENDED_RELEASE_TABLET | ORAL | 11 refills | Status: DC

## 2024-08-17 NOTE — Addendum Note (Signed)
 Encounter addended by: Harriett Azar M, RN on: 08/17/2024 1:29 PM  Actions taken: Charge Capture section accepted

## 2024-08-17 NOTE — Patient Instructions (Addendum)
 Good to see you today!  Take torsemide  20 mg 3 x a week( Monday Wednesday and Friday)  Take  potassium 20 meq 3 x a week( Monday Wednesday and Friday )  Labs done today, your results will be available in MyChart, we will contact you for abnormal readings.  Your physician recommends that you schedule a follow-up appointment as scheduled  If you have any questions or concerns before your next appointment please send us  a message through [place] or call our office at [phone].    TO LEAVE A MESSAGE FOR THE NURSE SELECT OPTION 2, PLEASE LEAVE A MESSAGE INCLUDING: YOUR NAME DATE OF BIRTH CALL BACK NUMBER REASON FOR CALL**this is important as we prioritize the call backs  YOU WILL RECEIVE A CALL BACK THE SAME DAY AS LONG AS YOU CALL BEFORE 4:00 PM At the [facility], you and your health needs are our priority. As part of our continuing mission to provide you with exceptional heart care, we have created designated Provider Care Teams. These Care Teams include your primary Cardiologist (physician) and Advanced Practice Providers (APPs- Physician Assistants and Nurse Practitioners) who all work together to provide you with the care you need, when you need it.   You may see any of the following providers on your designated Care Team at your next follow up: Dr Toribio Fuel Dr Ezra Shuck Dr. Ria Commander Dr. Morene Brownie Amy Lenetta, NP Caffie Shed, [place] [facility] [place], [place] Beckey Coe, NP [place] Lee, NP Ellouise Class, NP Tinnie Redman, PharmD Jaun Bash, PharmD   Please be sure to bring in all your medications bottles to every appointment.    Thank you for choosing [facility]

## 2024-08-17 NOTE — Progress Notes (Signed)
 ADVANCED HF CLINIC NOTE  PCP: Rosamond Leta NOVAK, MD HF Cardiologist: Dr. Rolan  HPI: Daniel Li is a 68 y.o. male with history of CAD, chronic systolic CHF/ischemic cardiomyopathy s/p St. Jude ICD in 2018, HTN, HLD, tobacco abuse, hx ETOH abuse, mitral regurgitation, OSA, hx CVA, hx PE 05/24, DM.  He had NSTEMI in 2007 s/p BMS to D1 and OM2. Later had STEMI in 2013 s/p BMS to RCA. Had NSTEMI in 2014 and found to have CTO of LCX treated medically. He had anterior STEMI in 04/18 s/p DES to LAD. Had DES to mLAD 10/19 in setting of NSTEMI. Last LHC at [facility] in 2023: patent stents in p LAD and D1, moderate diffuse disease mLAD, LCX occluded proximally and fills via collaterals from LAD, patent pRCA stent, 50% PLV, 50% PDA. CAD treated medically.  EF has been in the 20-25% range by echo since 2018.   Presented for mTEER 10/24, however found no significant MR on TEE, despite multiple TTEs suggestive otherwise. Procedure aborted and he was discharged.   Admitted 03/12/24 with left arm weakness, dizziness, and near syncope. MRI and CT head without acute abnormalities. L arm weakness form previous CVA. At discharge, jardiance , entresto , torsemide , and spironolactone  held. Discharge back Assisted Living.   Admitted 6/25 with acute PE. CTA chest showed acute LLL PE. ETOH level 68. He had been off his Eliquis  x 2 months. LE dopplers negative for DVT. Echo showed EF 25-30%, normal RV, moderate MR. AC restarted. GDMT limited by low BP and needed midodrine . He was discharged back to ALF, weight 146 lbs.  CPX arranged 10/25, which showed moderate to severe functional limitation (although sub-maximal effort due to dizziness and generalized feeling of weakness).  Today he returns for close HF follow up. Overall feeling fair. He remains dizzy. He has SOB walking fast on flat ground. Denies palpitations, abnormal bleeding, CP, edema, or PND/Orthopnea. Appetite ok. Weight at home 142 pounds. Taking all  medications. Has cut back on ETOH, drinks beer 2x/week, no tobacco or drug use.   St Jude device interrogation (personally reviewed): Daily impedence elevated suggesting volume low, <1% VP, no VT  Labs (6/23): K 4.4, creatinine 1.46, LFTs normal, TSH normal, Lp (a) 232 Labs (2/24): LDL 45, BNP 171, K 4.3, creatinine 1.33 Labs (4/24): K 4.1, SCr 1.1 Labs (5/24): K 5.2, creatinine 1.12 Labs (7/24): K 4.7, creatininine 1.31 Labs (8/24): LDL 144, LFTs normal, TSH normal Labs (10/24): K 4, creatinine 1.21 Labs (5/25): LDL 44 Labs (6/25): K 3.9, creatinine 1.10, normal LFTs, TSH 11.6 Labs (7/25): TSH normal Labs (9/25): K 4.1, creatinine 1.28, BNP 223, LFTs normal, LDL 89   PMH: 1. OSA: Supposed to be using CPAP.  2. HTN 3. CAD: Long history.  - NSTEMI 2007 with BMS D1 and OM2. - STEMI 5/13 with BMS RCA - NSTEMI 1/14 with CTO LCx and PLV, unable to open LCx.  - STEMI 4/18 with LHC showing old TO LCx with collaterals, old TO PLV, 70% ostial D1, totally occluded proximal LAD with some collaterals => DES to LAD.  - LHC 8/18 with old TO LCx, old TO PLV, 60% mLAD, patent LAD stent.  - LHC 4/19 with totally occluded PLV, totally occluded LCx, patent LAD and D1 stents, 50% mid LAD.  - NSTEMI 10/19. LHC with totally occluded PLV, totally occluded LCx, patent proximal LAD and D1 stents, 80% mid LAD stenosis => DES to mid LAD.  - LHC (9/21): occluded PLV  and proximal LCx with collaterals, patent LAD, 60% D2, 60% PDA.  No intervention.  - LHC (1/23, [facility]): significant 3-vessel disease with patent stents; no further stents were placed 4. Depression 5. Type II DM 6. Hyperlipidemia: History of rhabdomyolysis with statins, he is on Zetia .  7. Chronic systolic CHF: Ischemic cardiomyopathy.  Echo from 9/16 showed improvement in EF to 55-60%.  St Jude ICD.  - Echo (4/18) with EF 20-25%, dyskinetic apex, moderate-severe MR, severe LAE.  - Echo (8/18) with EF 20-25%, moderate to severe MR - Echo (4/19) with  EF 20-25%  - Echo (10/19) with EF 20-25%, no LV thrombus.  - RHC (10/19): mean RA 4, PA 22/7, mean PCWP 7, CI 2.3 - RHC (9/21): mean RA 1, PA 18/5, mean PCWP 7, CI 2.12 (F)/2.17(T) - Echo (5/22): EF 25% with mild LV dilation, no LV thrombus, normal RV, mild MR. - RHC (1/23): CO 3.0 and low PWCP at 7 mmHg - Echo (1/23): EF 25% - Echo (4/23): EF 20-25% with mild LV dilation, no LV thrombus, moderate-severe MR with ERO 0.39 cm^2 by PISA, normal RV, PASP 49.  - Echo (2/24): EF 20%, severe LV dilation, slow flow at apex w/o frank clot, RV normal, at least moderate MR, IVC normal.  - Echo (7/24): EF 20%, slow flow at apex, severe LV dilation, mild RV dysfunction, mod-severe MR, IVC normal.  - TEE (10/24): LVEF 20-25%, RV normal, mild MR  - Echo (6/25): EF 25-30%, normal RV, moderate MR - CPX (10/25): moderate to severe functional limitation (submaximal effort); peak VO2 11.7 (42% predicted), VE/VCO2 slope elevated at 39 8. PAD: - ABIs (4/18) with R ABI of 0.84 suggestive of mild disease.  L ABI of 1.27 (Normal flow at rest) - ABIs (5/19): abnormal TBI on right.  - ABIs (10/21): Normal 9. LV thrombus 10. H/o CVA 11. Mitral regurgitation: Likely functional. Echo in 4/23 with moderate-severe MR. EE (5/23): EF 20-25%, RV mildly reduced, no LAA, severe MR with no mitral stenosis, MV mean gradient 2.0 mmHg - TEE (5/23): EF 20-25%, mild RV dysfunction, severe MR.  - Echo (7/24): moderate-severe MR - TEE (10/24): LVEF 20-25%, RV normal, mild MR  12. PE: Diagnosed [facility] in 5/24. 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   ROS: All systems negative except as listed in HPI, PMH and Problem List.  SH:  Social History   Socioeconomic History   Marital status: Single    Spouse name: Not on file   Number of children: 0   Years of education: 9   Highest education level: 9th grade  Occupational History   Not on file  Tobacco Use   Smoking status: Every Day    Current packs/day: 0.50    Average packs/day: 0.5  packs/day for 47.1 years (23.5 ttl pk-yrs)    Types: Cigarettes, Cigars    Start date: 07/21/1977   Smokeless tobacco: Never  Vaping Use   Vaping status: Never Used  Substance and Sexual Activity   Alcohol use: Yes    Comment: 09/17/2017 nothing since 05/2017   Drug use: Not Currently    Comment: previously incarcerated for drug related offense.   Sexual activity: Not Currently  Other Topics Concern   Not on file  Social History Narrative   Not on file   Social Drivers of Health   Financial Resource Strain: Low Risk  (02/25/2023)   Received from [facility]   Overall Financial Resource Strain (CARDIA)    Difficulty of Paying Living Expenses: Not  hard at all  Food Insecurity: No Food Insecurity (04/09/2024)   Hunger Vital Sign    Worried About Running Out of Food in the Last Year: Never true    Ran Out of Food in the Last Year: Never true  Transportation Needs: No Transportation Needs (04/09/2024)   PRAPARE - Administrator, Civil Service (Medical): No    Lack of Transportation (Non-Medical): No  Recent Concern: Transportation Needs - Unmet Transportation Needs (01/22/2024)   PRAPARE - Administrator, Civil Service (Medical): Yes    Lack of Transportation (Non-Medical): Yes  Physical Activity: Not on file  Stress: Not on file  Social Connections: Moderately Isolated (04/09/2024)   Social Connection and Isolation Panel    Frequency of Communication with Friends and Family: More than three times a week    Frequency of Social Gatherings with Friends and Family: More than three times a week    Attends Religious Services: 1 to 4 times per year    Active Member of [employer] or Organizations: No    Attends Banker Meetings: Never    Marital Status: Never married  Intimate Partner Violence: Not At Risk (04/09/2024)   Humiliation, Afraid, Rape, and Kick questionnaire    Fear of Current or Ex-Partner: No    Emotionally Abused: No    Physically  Abused: No    Sexually Abused: No    FH:  Family History  Problem Relation Age of Onset   Stroke Mother    Heart attack Mother    Heart attack Father    Stroke Sister    Heart attack Sister    Heart attack Brother    Stroke Brother    Liver disease Neg Hx    Colon cancer Neg Hx     Past Medical History:  Diagnosis Date   AICD (automatic cardioverter/defibrillator) present    Anxiety    Arthritis    all over (09/17/2017)   Burn    CAD (coronary artery disease)    a. NSTEMI s/p BMS to 1st Diagonal and distal OM2 in 2007; b. STEMI 03/26/12 s/p BMS to RCA; c. NSTEMI 10/2012 : CTO of LCx (unable to open with PCI) and PL branch, mod dz of LAD and diagonal, and preserved LV systolic fxn, Med Rx;  d.  anterior STEMI in 01/2017 with DES to Proximal LAD   CAD in native artery 08/23/2020   Cardiomyopathy EF 35% on cath 06/30/14, new from jan 2015 07/20/2014   Chest pain    CHF (congestive heart failure) (HCC)    Chronic back pain    all over (09/17/2017)   DDD (degenerative disc disease), cervical    Depression    GERD (gastroesophageal reflux disease)    Headache    a few/wk (09/17/2017)   HTN (hypertension)    Hypercholesterolemia    Mental disorder    Myocardial infarction (HCC)    I've had 7 (09/17/2017)   Pulmonary edema    Respiratory failure (HCC)    Sciatic pain    Sleep apnea    Stroke [facility])    a. multiple dating back to 2002; *I''ve had 5; LUE/LLE weaker since (09/17/2017)   Tibia fracture (l) leg   Tobacco abuse    Type 2 diabetes mellitus (HCC)    Unstable angina (HCC)     Current Outpatient Medications  Medication Sig Dispense Refill   acetaminophen  (TYLENOL ) 325 MG tablet Take 650 mg by mouth every 6 (six) hours as  needed for mild pain.     albuterol  (VENTOLIN  HFA) 108 (90 Base) MCG/ACT inhaler Inhale 2 puffs into the lungs every 4 (four) hours as needed for wheezing or shortness of breath. 1 each 3   amiodarone  (PACERONE ) 200 MG tablet TAKE (1)  TABLET BY MOUTH ONCE DAILY. 30 tablet 10   apixaban  (ELIQUIS ) 5 MG TABS tablet Take 1 tablet (5 mg total) by mouth 2 (two) times daily for 6 days. 60 tablet 3   digoxin  (LANOXIN ) 0.125 MG tablet TAKE (1) TABLET BY MOUTH ONCE DAILY. 30 tablet 11   ezetimibe  (ZETIA ) 10 MG tablet Take 1 tablet (10 mg total) by mouth daily. 30 tablet 3   gabapentin  (NEURONTIN ) 100 MG capsule Take 100 mg by mouth 2 (two) times daily.     JARDIANCE  10 MG TABS tablet Take 1 tablet (10 mg total) by mouth daily before breakfast. 30 tablet 11   levothyroxine  (SYNTHROID ) 100 MCG tablet Take 1 tablet (100 mcg total) by mouth daily at 6 (six) AM. 30 tablet 0   lidocaine  (LIDODERM ) 5 % Place 1 patch onto the skin every 12 (twelve) hours. Leave off 12 hours     metFORMIN  (GLUCOPHAGE ) 500 MG tablet Take 500 mg by mouth daily with breakfast.     nitroGLYCERIN  (NITROSTAT ) 0.4 MG SL tablet PLACE ONE (1) TABLET UNDER TONGUE EVERY 5 MINUTES UP TO (3) DOSES AS NEEDED FOR CHEST PAIN. IF NO RELIEF, CONTACT MD. 25 tablet 3   pantoprazole  (PROTONIX ) 40 MG tablet Take 40 mg by mouth daily.     potassium chloride  (KLOR-CON ) 10 MEQ tablet Take 2 tablets (20 mEq total) by mouth daily. 60 tablet 11   spironolactone  (ALDACTONE ) 25 MG tablet Take 0.5 tablets (12.5 mg total) by mouth daily. 45 tablet 3   thiamine  (VITAMIN B-1) 100 MG tablet Take 1 tablet (100 mg total) by mouth daily. 30 tablet 2   torsemide  (DEMADEX ) 20 MG tablet TAKE (1) TABLET BY MOUTH ONCE DAILY. 30 tablet 11   Study - COMET-HF - omecamtiv mecarbil 25 mg tablet (2-week run in)(PI-[place]) Take 1 tablet (25 mg total) by mouth 2 (two) times daily. Take around the same time each day, with or without meals. Swallow whole. Do not chew, crush, or split. (Patient not taking: Reported on 08/17/2024) 70 tablet 0   No current facility-administered medications for this encounter.   Wt Readings from Last 3 Encounters:  08/17/24 64.1 kg (141 lb 6.4 oz)  07/27/24 65 kg (143 lb 3.2 oz)   06/30/24 66.3 kg (146 lb 1.6 oz)   BP 102/70   Pulse 77   Wt 64.1 kg (141 lb 6.4 oz)   SpO2 95%   BMI 22.82 kg/m   PHYSICAL EXAM: General:  NAD. No resp difficulty, walked into clinic, fatigued-appearing HEENT: Normal Neck: Supple. No JVD. Cor: Regular rate & rhythm. No rubs, gallops or murmurs. Lungs: Clear Abdomen: Soft, nontender, nondistended.  Extremities: No cyanosis, clubbing, rash, edema Neuro: Alert & oriented x 3, moves all 4 extremities w/o difficulty. Affect pleasant.  ASSESSMENT & PLAN:  1. Chronic systolic CHF: Ischemic cardiomyopathy, St Jude ICD. Echo in 10/19 with EF 20-25%.  Echo in 5/22 with EF 25%, normal RV.  Echo 1/23 showed EF 25%. R/LHC in 1/23 at [facility] showed no new CAD, preserved CO and low PCWP suggesting over-diuresis. PYP scan obtained and did not suggest TTR amyloidosis. Echo 4/23 showed EF 20-25% with mild LV dilation, no LV thrombus, moderate-severe MR with ERO 0.39 cm^2  by PISA, normal RV, PASP 49. RHC in 5/23 showed RA mean 2, PA 49/12 (31), PCWP mean 21 with prominent v waves to 45 suggesting severe MR, Fick CO/CI 3.81/2.18. Echo 7/24 showed EF 20%, slow flow at apex, severe LV dilation, mild RV dysfunction, mod-severe MR, IVC normal. TEE (10/24): LVEF 20-25%, RV normal, mild MR. Echo 6/25 showed EF 25-30%, normal RV, moderate MR. CPX 9/25, sub-max effort, showed moderate to severe functional impairment. Today, NYHA IIb, he is not volume overloaded today, weight down and CorVue suggests mild hypovolemia. GDMT limited by low BP and orthostasis. - Decrease torsemide  to 20 mg MWF, decrease KCL to 20 MWF. BMET today. - Continue Jardiance  10 mg daily. - Continue spironolactone  12.5 mg daily - Continue digoxin  0.125 mg daily. Check dig level today. - No BP room to add ARB/ARNi - No beta blocker with soft BP and marginal output. - Unclear if progressive symptoms are related to orthostasis or worsening HF with low output. Extremities warm on exam today. Consider  RHC. 2. CAD: Extensive prior history of CAD.  He has known total occlusion LCx and PLV.  NSTEMI 10/19 with DES to mid LAD.  Coronary angiography in 9/21 showed stable coronary disease, no intervention was done.  Admission 1/23 to [facility] concerning for ACS, however LHC showed patent stents, no new stents placed. No recent ischemic-type chest pain.  - no ASA with Eliquis  use. - No statin given history of rhabdomyolysis while on statin therapy - Continue Zetia  10 mg daily.   3. LV thrombus: Echo 4/23 showed no LV thrombus.  Warfarin was stopped during one of his admissions at [facility]. No LV thrombus on TEE (5/23). He was started on apixaban  in 5/24 due to PE.  No thrombus noted on most recent echocardiograms - Continue Eliquis  5 mg bid. No bleeding issues. Recent CBC stable 4. Mitral regurgitation: Echo 4/23 showed moderate-severe likely functional MR.  He may benefit from Mitraclip procedure. TEE 5/23 with severe functional MR with restricted posterior leaflet. Elevated PCWP with prominent V waves on RHC in 5/23 suggestive of severe MR.  Patient was seen by structural heart team. He presented for mTEER in 10/24, however TEE showed only mild MR. Procedure aborted.  Last echo in 6/25 showed moderate functional MR.  5. 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  - He is on apixaban .  6. ETOH use: He has cut back. - Continue to recommend complete cessation.  7. NSVT: Now off Coreg  with marginal output and soft BP.  - Continue amiodarone  at 200 mg daily. Recent LFTs and TSH normal. He will need a regular eye exam.  8. Cocaine abuse: He was cocaine positive during 2/24 admission.  Denies current use.  9. PE: Diagnosed in 5/24, on apixaban  (continue long-term). ? Compliance and new LLL PE in 6/25.  - Continue Eliquis  5 mg bid.    Follow up in 4-6 weeks with APP.  Harlene HERO [facility] FNP-BC 08/17/2024

## 2024-08-24 ENCOUNTER — Ambulatory Visit: Payer: Self-pay | Admitting: Cardiovascular Disease

## 2024-08-25 ENCOUNTER — Ambulatory Visit: Attending: Cardiovascular Disease

## 2024-08-25 DIAGNOSIS — M25361 Other instability, right knee: Secondary | ICD-10-CM | POA: Diagnosis not present

## 2024-08-25 DIAGNOSIS — G8929 Other chronic pain: Secondary | ICD-10-CM | POA: Diagnosis not present

## 2024-08-25 DIAGNOSIS — M1711 Unilateral primary osteoarthritis, right knee: Secondary | ICD-10-CM | POA: Diagnosis not present

## 2024-08-25 DIAGNOSIS — I5042 Chronic combined systolic (congestive) and diastolic (congestive) heart failure: Secondary | ICD-10-CM

## 2024-08-25 DIAGNOSIS — Z95810 Presence of automatic (implantable) cardiac defibrillator: Secondary | ICD-10-CM

## 2024-08-25 NOTE — Progress Notes (Signed)
 EPIC Encounter for ICM Monitoring  Patient Name: Daniel Li is a 68 y.o. male Date: 08/25/2024 Primary Care Physican: Rosamond Leta NOVAK, MD Primary Cardiologist: [facility] Electrophysiologist: Mealor 08/10/2022 Office Weight: 140 lbs                11/05/2023 Weight: 150 lbs   03/12/2024 Office Weight: 149 lbs    05/12/2024 Office Weight: 147 lbs      07/27/2024 Office Weight: 143.3 lbs  08/17/2024 Office Weight: 141 lbs (HF clinic visit) 08/25/2024 Weight: 149.8 lbs  (at ALF and MD office)                      Spoke with patient and heart failure questions reviewed.  Transmission results reviewed.  Pt 's weight has increased from 141 lbs at HF OV to 149 lbs at ALF and weighed at MD office today, 08/25/2024.    Since 06/22/2024 ICM Remote Transmission:  CorVue thoracic impedance suggesting possible fluid accumulation starting 08/20/2024 after Torsemide  and Potassium decreased on 08/17/2024.   Prescribed dosage:  Torsemide  20 mg Take 1 tablet(s) (20 mg total) by mouth 3 times a week, Mon, Wed and Friday (decreased from daily 08/17/2024) Potassium 10 mEq take 2 tablets (20 mEq total) by mouth 3 times a week, Mon, Wed and Friday (decreased from daily 08/17/2024) Spironolactone  25 mg take 0.5 tablet (12.5 mg total) daily   Labs: 08/17/2024 Creatinine 1.36, BUN 12, Potassium 4.2, Sodium 137, GFR 47 08/12/2024 Creatinine 1.42, BUN 14, Potassium 4.8, Sodium 138, GFR 54 07/27/2024 Creatinine 1.28, BUN 13, Potassium 4.1, Sodium 138, GFR >60 06/21/2024 Creatinine 1.27, BUN 14, Potassium 3.4, Sodium 142, GFR >60 06/08/2024 Creatinine 1.13, BUN 15, Potassium 3.9, Sodium 143 05/12/2024 Creatinine 1.36, BUN 17, Potassium 4.0, Sodium 138, GFR 57  04/14/2024 Creatinine 1.10, BUN 16, Potassium 3.9, Sodium 141, GFR >60  04/12/2024 Creatinine 1.04, BUN 15, Potassium 3.8, Sodium 138, GFR >60  04/10/2024 Creatinine 1.11, BUN 12, Potassium 4.0, Sodium 141, GFR >60 04/09/2024 Creatinine  1.25, BUN 12, Potassium 3.0, Sodium 140, GFR >60  04/06/2024 Creatinine 1.65, BUN 8,   Potassium 4.2, Sodium 143, GFR 45  A complete set of results can be found in Results Review.   Recommendations:  Copy sent to [name], NP at [facility] clinic for review and recommendations if needed.       Follow-up plan: ICM clinic phone appointment on 09/02/2024.    91 day device clinic remote transmission 11/09/2024.     EP/Cardiology Office Visits:  09/21/2024 with HF Clinic.    Recall 03/25/2025 with EP APP.   Copy of ICM check sent to Dr. Nancey.   Remote monitoring is medically necessary for Heart Failure Management.    Daily Thoracic Impedance ICM trend: 05/27/2024 through 08/25/2024.    12-14 Month Thoracic Impedance ICM trend:     [name], RN 08/25/2024 11:30 AM

## 2024-08-26 NOTE — Progress Notes (Signed)
  Received: Nilsa Gainer, Harlene HERO, FNP  Jozalynn Noyce, Mitzie RAMAN, RN; P [facility] Clinical Pool Thanks Mitzie.  Volume creeping back up.  Change torsemide  20 mg to M-F, hold on weekends. Change 20 KCL to M-F, hold on weekends. Please call his group home. Will re-assess at his follow up next month

## 2024-08-26 NOTE — Progress Notes (Signed)
 Message sent to P [facility] Clinical Pool to contact ALF to provide recommendations.

## 2024-08-27 ENCOUNTER — Encounter: Payer: Self-pay | Admitting: Family Medicine

## 2024-08-27 ENCOUNTER — Telehealth (HOSPITAL_COMMUNITY): Payer: Self-pay

## 2024-08-27 DIAGNOSIS — I5022 Chronic systolic (congestive) heart failure: Secondary | ICD-10-CM

## 2024-08-27 MED ORDER — POTASSIUM CHLORIDE ER 10 MEQ PO TBCR
EXTENDED_RELEASE_TABLET | ORAL | 11 refills | Status: AC
Start: 2024-08-28 — End: ?

## 2024-08-27 MED ORDER — TORSEMIDE 20 MG PO TABS: ORAL_TABLET | ORAL | 11 refills | Status: AC

## 2024-08-27 NOTE — Telephone Encounter (Addendum)
 Assisted living called and verbal order given. New RX sent to [facility] at [phone]  ----- Message from Nurse Mitzie RAMAN sent at 08/25/2024  5:14 PM EDT ----- I will recheck his fluid levels.    [facility] Clinical Pool:  If you can call the assisted living facility I would appreciate it. I don't have the ability to fax in case they need orders faxed to them. ----- Message ----- From: Glena Harlene HERO, FNP Sent: 08/25/2024  12:26 PM EDT To: Mitzie RAMAN Garner, RN; [facility] Clinical Pool  Thanks Mitzie.  Volume creeping back up.  Change torsemide  20 mg to M-F, hold on weekends. Change 20 KCL to M-F, hold on weekends. Please call his group home. Will re-assess at his follow up next month ----- Message ----- From: Garner Mitzie RAMAN, RN Sent: 08/25/2024  11:45 AM EDT To: Harlene HERO Glena, FNP  Harlene, can you have HF clinic notify Assisted Living facility if you make any changes.  I don't have fax capability.

## 2024-08-27 NOTE — Progress Notes (Signed)
 Care home called and order faxed

## 2024-08-31 ENCOUNTER — Encounter

## 2024-09-01 ENCOUNTER — Telehealth: Payer: Self-pay

## 2024-09-01 ENCOUNTER — Ambulatory Visit: Attending: Cardiovascular Disease

## 2024-09-01 DIAGNOSIS — I5022 Chronic systolic (congestive) heart failure: Secondary | ICD-10-CM

## 2024-09-01 DIAGNOSIS — Z95810 Presence of automatic (implantable) cardiac defibrillator: Secondary | ICD-10-CM | POA: Diagnosis not present

## 2024-09-01 NOTE — Progress Notes (Signed)
 EPIC Encounter for ICM Monitoring  Patient Name: Daniel Li is a 68 y.o. male Date: 09/01/2024 Primary Care Physican: Rosamond Leta NOVAK, MD Primary Cardiologist: [facility] Electrophysiologist: Mealor 08/10/2022 Office Weight: 140 lbs                11/05/2023 Weight: 150 lbs   03/12/2024 Office Weight: 149 lbs    05/12/2024 Office Weight: 147 lbs      07/27/2024 Office Weight: 143.3 lbs  08/17/2024 Office Weight: 141 lbs (HF clinic visit) 08/25/2024 Weight: 149.8 lbs  (at ALF and MD office)                      Attempted call to patient and unable to reach.   Transmission results reviewed.    Since 06/22/2024 ICM Remote Transmission:  CorVue thoracic impedance suggesting possible fluid accumulation starting 08/20/2024, back to baseline 08/29/2024 and possible fluid returned 08/30/2024 .   Prescribed dosage:  Torsemide  20 mg Take 1 tablet(s) (20 mg total) by mouth Mon-Friday only and hold Saturdays and Sundays (increased 08/25/2024) Potassium 10 mEq take 2 tablets (20 mEq total) by mouth Mon-Friday only and hold Saturdays and Sundays  (increased from daily 08/25/2024) Spironolactone  25 mg take 0.5 tablet (12.5 mg total) daily   Labs: 08/17/2024 Creatinine 1.36, BUN 12, Potassium 4.2, Sodium 137, GFR 47 08/12/2024 Creatinine 1.42, BUN 14, Potassium 4.8, Sodium 138, GFR 54 07/27/2024 Creatinine 1.28, BUN 13, Potassium 4.1, Sodium 138, GFR >60 06/21/2024 Creatinine 1.27, BUN 14, Potassium 3.4, Sodium 142, GFR >60 06/08/2024 Creatinine 1.13, BUN 15, Potassium 3.9, Sodium 143 05/12/2024 Creatinine 1.36, BUN 17, Potassium 4.0, Sodium 138, GFR 57  04/14/2024 Creatinine 1.10, BUN 16, Potassium 3.9, Sodium 141, GFR >60  04/12/2024 Creatinine 1.04, BUN 15, Potassium 3.8, Sodium 138, GFR >60  04/10/2024 Creatinine 1.11, BUN 12, Potassium 4.0, Sodium 141, GFR >60 04/09/2024 Creatinine 1.25, BUN 12, Potassium 3.0, Sodium 140, GFR >60  04/06/2024 Creatinine 1.65, BUN 8,   Potassium  4.2, Sodium 143, GFR 45  A complete set of results can be found in Results Review.   Recommendations:  Unable to reach.  Will send to HF clinic for review if patient is reached.      Follow-up plan: ICM clinic phone appointment on 09/07/2024 to check fluid levels.    91 day device clinic remote transmission 11/09/2024.     EP/Cardiology Office Visits:  09/21/2024 with HF Clinic.    Recall 03/25/2025 with EP APP.   Copy of ICM check sent to Dr. Nancey.   Remote monitoring is medically necessary for Heart Failure Management.    Daily Thoracic Impedance ICM trend: 05/03/2024 through 09/01/2024.    12-14 Month Thoracic Impedance ICM trend:     [name], RN 09/01/2024 7:37 AM

## 2024-09-01 NOTE — Telephone Encounter (Signed)
 Remote ICM transmission received.  Attempted call to patient regarding ICM remote transmission and no answer.

## 2024-09-02 ENCOUNTER — Ambulatory Visit

## 2024-09-07 ENCOUNTER — Ambulatory Visit

## 2024-09-09 ENCOUNTER — Ambulatory Visit: Attending: Cardiovascular Disease

## 2024-09-09 DIAGNOSIS — I5022 Chronic systolic (congestive) heart failure: Secondary | ICD-10-CM

## 2024-09-09 DIAGNOSIS — Z95810 Presence of automatic (implantable) cardiac defibrillator: Secondary | ICD-10-CM

## 2024-09-09 NOTE — Progress Notes (Signed)
 EPIC Encounter for ICM Monitoring  Patient Name: Daniel Li is a 68 y.o. male Date: 09/09/2024 Primary Care Physican: Rosamond Leta NOVAK, MD Primary Cardiologist: [facility] Electrophysiologist: Mealor 08/10/2022 Office Weight: 140 lbs                11/05/2023 Weight: 150 lbs   03/12/2024 Office Weight: 149 lbs    05/12/2024 Office Weight: 147 lbs      07/27/2024 Office Weight: 143.3 lbs  08/17/2024 Office Weight: 141 lbs (HF clinic visit) 08/25/2024 Weight: 149.8 lbs  (at ALF and MD office)                      Transmission results reviewed.    Since 09/01/2024 ICM Remote Transmission:  CorVue thoracic impedance suggesting fluid levels returned to baseline 09/01/2024.   Prescribed dosage:  Torsemide  20 mg Take 1 tablet(s) (20 mg total) by mouth Mon-Friday only and hold Saturdays and Sundays (increased 08/25/2024) Potassium 10 mEq take 2 tablets (20 mEq total) by mouth Mon-Friday only and hold Saturdays and Sundays  (increased from daily 08/25/2024) Spironolactone  25 mg take 0.5 tablet (12.5 mg total) daily   Labs: 08/17/2024 Creatinine 1.36, BUN 12, Potassium 4.2, Sodium 137, GFR 47 08/12/2024 Creatinine 1.42, BUN 14, Potassium 4.8, Sodium 138, GFR 54 07/27/2024 Creatinine 1.28, BUN 13, Potassium 4.1, Sodium 138, GFR >60 06/21/2024 Creatinine 1.27, BUN 14, Potassium 3.4, Sodium 142, GFR >60 06/08/2024 Creatinine 1.13, BUN 15, Potassium 3.9, Sodium 143 05/12/2024 Creatinine 1.36, BUN 17, Potassium 4.0, Sodium 138, GFR 57  04/14/2024 Creatinine 1.10, BUN 16, Potassium 3.9, Sodium 141, GFR >60  04/12/2024 Creatinine 1.04, BUN 15, Potassium 3.8, Sodium 138, GFR >60  04/10/2024 Creatinine 1.11, BUN 12, Potassium 4.0, Sodium 141, GFR >60 04/09/2024 Creatinine 1.25, BUN 12, Potassium 3.0, Sodium 140, GFR >60  04/06/2024 Creatinine 1.65, BUN 8,   Potassium 4.2, Sodium 143, GFR 45  A complete set of results can be found in Results Review.   Recommendations: No changes.     Follow-up plan: ICM clinic phone appointment on 10/12/2024.    91 day device clinic remote transmission 11/09/2024.     EP/Cardiology Office Visits:  09/21/2024 with HF Clinic.    Recall 03/25/2025 with EP APP.   Copy of ICM check sent to Dr. Nancey.    Remote monitoring is medically necessary for Heart Failure Management.    Daily Thoracic Impedance ICM trend: 06/11/2024 through 09/09/2024.    12-14 Month Thoracic Impedance ICM trend:     [name], RN 09/09/2024 2:26 PM

## 2024-09-18 ENCOUNTER — Telehealth (HOSPITAL_COMMUNITY): Payer: Self-pay

## 2024-09-18 NOTE — Telephone Encounter (Signed)
 Called to confirm/remind patient of their appointment at the [facility] on 09/21/2024.   Appointment:   [x] Confirmed  [] Left mess   [] No answer/No voice mail  [] VM Full/unable to leave message  [] Phone not in service  Patient reminded to bring all medications and/or complete list.  Confirmed patient has transportation. Gave directions, instructed to utilize valet parking.

## 2024-09-21 ENCOUNTER — Ambulatory Visit (HOSPITAL_COMMUNITY)
Admission: RE | Admit: 2024-09-21 | Discharge: 2024-09-21 | Disposition: A | Discharge status: Home / Self Care | Source: Ambulatory Visit | Attending: Family Medicine | Admitting: Family Medicine

## 2024-09-21 ENCOUNTER — Encounter (HOSPITAL_COMMUNITY): Payer: Self-pay

## 2024-09-21 VITALS — BP 110/78 | HR 69 | Ht 66.5 in | Wt 148.2 lb

## 2024-09-21 DIAGNOSIS — Z4502 Encounter for adjustment and management of automatic implantable cardiac defibrillator: Secondary | ICD-10-CM | POA: Insufficient documentation

## 2024-09-21 DIAGNOSIS — Z79899 Other long term (current) drug therapy: Secondary | ICD-10-CM | POA: Diagnosis not present

## 2024-09-21 DIAGNOSIS — I2510 Atherosclerotic heart disease of native coronary artery without angina pectoris: Secondary | ICD-10-CM | POA: Diagnosis not present

## 2024-09-21 DIAGNOSIS — Z8673 Personal history of transient ischemic attack (TIA), and cerebral infarction without residual deficits: Secondary | ICD-10-CM | POA: Diagnosis not present

## 2024-09-21 DIAGNOSIS — I2582 Chronic total occlusion of coronary artery: Secondary | ICD-10-CM | POA: Diagnosis not present

## 2024-09-21 DIAGNOSIS — I110 Hypertensive heart disease with heart failure: Secondary | ICD-10-CM | POA: Diagnosis not present

## 2024-09-21 DIAGNOSIS — G4733 Obstructive sleep apnea (adult) (pediatric): Secondary | ICD-10-CM | POA: Diagnosis not present

## 2024-09-21 DIAGNOSIS — Z7984 Long term (current) use of oral hypoglycemic drugs: Secondary | ICD-10-CM | POA: Insufficient documentation

## 2024-09-21 DIAGNOSIS — F1011 Alcohol abuse, in remission: Secondary | ICD-10-CM

## 2024-09-21 DIAGNOSIS — Z7901 Long term (current) use of anticoagulants: Secondary | ICD-10-CM | POA: Insufficient documentation

## 2024-09-21 DIAGNOSIS — I513 Intracardiac thrombosis, not elsewhere classified: Secondary | ICD-10-CM | POA: Diagnosis not present

## 2024-09-21 DIAGNOSIS — I252 Old myocardial infarction: Secondary | ICD-10-CM | POA: Insufficient documentation

## 2024-09-21 DIAGNOSIS — E119 Type 2 diabetes mellitus without complications: Secondary | ICD-10-CM | POA: Insufficient documentation

## 2024-09-21 DIAGNOSIS — I340 Nonrheumatic mitral (valve) insufficiency: Secondary | ICD-10-CM | POA: Insufficient documentation

## 2024-09-21 DIAGNOSIS — Z955 Presence of coronary angioplasty implant and graft: Secondary | ICD-10-CM | POA: Diagnosis not present

## 2024-09-21 DIAGNOSIS — F17210 Nicotine dependence, cigarettes, uncomplicated: Secondary | ICD-10-CM | POA: Insufficient documentation

## 2024-09-21 DIAGNOSIS — Z86711 Personal history of pulmonary embolism: Secondary | ICD-10-CM | POA: Diagnosis not present

## 2024-09-21 DIAGNOSIS — F17290 Nicotine dependence, other tobacco product, uncomplicated: Secondary | ICD-10-CM | POA: Insufficient documentation

## 2024-09-21 DIAGNOSIS — I472 Ventricular tachycardia, unspecified: Secondary | ICD-10-CM | POA: Diagnosis not present

## 2024-09-21 DIAGNOSIS — Z555 Less than a high school diploma: Secondary | ICD-10-CM | POA: Diagnosis not present

## 2024-09-21 DIAGNOSIS — I255 Ischemic cardiomyopathy: Secondary | ICD-10-CM | POA: Diagnosis not present

## 2024-09-21 DIAGNOSIS — E7800 Pure hypercholesterolemia, unspecified: Secondary | ICD-10-CM | POA: Diagnosis not present

## 2024-09-21 DIAGNOSIS — F1410 Cocaine abuse, uncomplicated: Secondary | ICD-10-CM

## 2024-09-21 DIAGNOSIS — I5022 Chronic systolic (congestive) heart failure: Secondary | ICD-10-CM | POA: Diagnosis present

## 2024-09-21 DIAGNOSIS — I4729 Other ventricular tachycardia: Secondary | ICD-10-CM

## 2024-09-21 NOTE — Progress Notes (Signed)
 ReDS Vest / Clip - 09/21/24 1100       ReDS Vest / Clip   Station Marker C    Ruler Value 28    ReDS Value Range Low volume    ReDS Actual Value 32

## 2024-09-21 NOTE — Patient Instructions (Signed)
 NO CHANGES TODAY!  Follow-Up in: 2-3 MONTHS WITH DR. ROLAN PLEASE CALL OUR OFFICE AROUND JANUARY TO GET SCHEDULED FOR YOUR APPOINTMENT. PHONE NUMBER IS [phone] OPTION 2   At the [facility], you and your health needs are our priority. We have a designated team specialized in the treatment of Heart Failure. This Care Team includes your primary Heart Failure Specialized Cardiologist (physician), Advanced Practice Providers (APPs- Physician Assistants and Nurse Practitioners), and Pharmacist who all work together to provide you with the care you need, when you need it.   You may see any of the following providers on your designated Care Team at your next follow up:  Dr. Toribio Fuel Dr. Ezra Rolan Dr. Odis Brownie Greig Mosses, NP Caffie Shed, [place] [facility], [place] Beckey Coe, NP Jordan Lee, NP Tinnie Redman, PharmD   Please be sure to bring in all your medications bottles to every appointment.   Need to Contact Us :  If you have any questions or concerns before your next appointment please send us  a message through [place] or call our office at [phone].    TO LEAVE A MESSAGE FOR THE NURSE SELECT OPTION 2, PLEASE LEAVE A MESSAGE INCLUDING: YOUR NAME DATE OF BIRTH CALL BACK NUMBER REASON FOR CALL**this is important as we prioritize the call backs  YOU WILL RECEIVE A CALL BACK THE SAME DAY AS LONG AS YOU CALL BEFORE 4:00 PM

## 2024-09-21 NOTE — Progress Notes (Signed)
 [facility]ED [facility] CLINIC NOTE  PCP: Rosamond Leta NOVAK, MD HF Cardiologist: Dr. Rolan  HPI: Daniel Li is a 68 y.o. male with history of CAD, chronic systolic CHF/ischemic cardiomyopathy s/p St. Jude ICD in 2018, HTN, HLD, tobacco abuse, hx ETOH abuse, mitral regurgitation, OSA, hx CVA, hx PE 05/24, DM.  He had NSTEMI in 2007 s/p BMS to D1 and OM2. Later had STEMI in 2013 s/p BMS to RCA. Had NSTEMI in 2014 and found to have CTO of LCX treated medically. He had anterior STEMI in 04/18 s/p DES to LAD. Had DES to mLAD 10/19 in setting of NSTEMI. Last LHC at [facility] in 2023: patent stents in p LAD and D1, moderate diffuse disease mLAD, LCX occluded proximally and fills via collaterals from LAD, patent pRCA stent, 50% PLV, 50% PDA. CAD treated medically.  EF has been in the 20-25% range by echo since 2018.   Presented for mTEER 10/24, however found no significant MR on TEE, despite multiple TTEs suggestive otherwise. Procedure aborted and he was discharged.   Admitted 03/12/24 with left arm weakness, dizziness, and near syncope. MRI and CT head without acute abnormalities. L arm weakness form previous CVA. At discharge, jardiance , entresto , torsemide , and spironolactone  held. Discharge back Assisted Living.   Admitted 6/25 with acute PE. CTA chest showed acute LLL PE. ETOH level 68. He had been off his Eliquis  x 2 months. LE dopplers negative for DVT. Echo showed EF 25-30%, normal RV, moderate MR. AC restarted. GDMT limited by low BP and needed midodrine . He was discharged back to ALF, weight 146 lbs.  CPX arranged 10/25, which showed moderate to severe functional limitation (although sub-maximal effort due to dizziness and generalized feeling of weakness).  Today he returns for HF follow up. Overall feeling fine. He is not SOB walking on flat ground, does OK with ADLs. Remains in group home who assist him with his medications.  Denies palpitations, abnormal bleeding, CP, dizziness, edema, or  PND/Orthopnea. Appetite ok. Weight at home 149 pounds. Has not had any ETOH since 08/28/24, no tobacco or drug use.   ReDs reading: 32 %, normal  St Jude device interrogation (personally reviewed): CorVue stable, <1%VT.   Labs (6/23): K 4.4, creatinine 1.46, LFTs normal, TSH normal, Lp (a) 232 Labs (5/25): LDL 44 Labs (6/25): K 3.9, creatinine 1.10, normal LFTs, TSH 11.6 Labs (7/25): TSH normal Labs (9/25): K 4.1, creatinine 1.28, BNP 223, LFTs normal, LDL 89 Labs (10/25): K 4.2, creatinine 1.36   PMH: 1. OSA: Supposed to be using CPAP.  2. HTN 3. CAD: Long history.  - NSTEMI 2007 with BMS D1 and OM2. - STEMI 5/13 with BMS RCA - NSTEMI 1/14 with CTO LCx and PLV, unable to open LCx.  - STEMI 4/18 with LHC showing old TO LCx with collaterals, old TO PLV, 70% ostial D1, totally occluded proximal LAD with some collaterals => DES to LAD.  - LHC 8/18 with old TO LCx, old TO PLV, 60% mLAD, patent LAD stent.  - LHC 4/19 with totally occluded PLV, totally occluded LCx, patent LAD and D1 stents, 50% mid LAD.  - NSTEMI 10/19. LHC with totally occluded PLV, totally occluded LCx, patent proximal LAD and D1 stents, 80% mid LAD stenosis => DES to mid LAD.  - LHC (9/21): occluded PLV and proximal LCx with collaterals, patent LAD, 60% D2, 60% PDA.  No intervention.  - LHC (1/23, [facility]): significant 3-vessel disease with patent stents; no further stents  were placed 4. Depression 5. Type II DM 6. Hyperlipidemia: History of rhabdomyolysis with statins, he is on Zetia .  7. Chronic systolic CHF: Ischemic cardiomyopathy.  Echo from 9/16 showed improvement in EF to 55-60%.  St Jude ICD.  - Echo (4/18) with EF 20-25%, dyskinetic apex, moderate-severe MR, severe LAE.  - Echo (8/18) with EF 20-25%, moderate to severe MR - Echo (4/19) with EF 20-25%  - Echo (10/19) with EF 20-25%, no LV thrombus.  - RHC (10/19): mean RA 4, PA 22/7, mean PCWP 7, CI 2.3 - RHC (9/21): mean RA 1, PA 18/5, mean PCWP 7, CI 2.12  (F)/2.17(T) - Echo (5/22): EF 25% with mild LV dilation, no LV thrombus, normal RV, mild MR. - RHC (1/23): CO 3.0 and low PWCP at 7 mmHg - Echo (1/23): EF 25% - Echo (4/23): EF 20-25% with mild LV dilation, no LV thrombus, moderate-severe MR with ERO 0.39 cm^2 by PISA, normal RV, PASP 49.  - Echo (2/24): EF 20%, severe LV dilation, slow flow at apex w/o frank clot, RV normal, at least moderate MR, IVC normal.  - Echo (7/24): EF 20%, slow flow at apex, severe LV dilation, mild RV dysfunction, mod-severe MR, IVC normal.  - TEE (10/24): LVEF 20-25%, RV normal, mild MR  - Echo (6/25): EF 25-30%, normal RV, moderate MR - CPX (10/25): moderate to severe functional limitation (submaximal effort); peak VO2 11.7 (42% predicted), VE/VCO2 slope elevated at 39 8. PAD: - ABIs (4/18) with R ABI of 0.84 suggestive of mild disease.  L ABI of 1.27 (Normal flow at rest) - ABIs (5/19): abnormal TBI on right.  - ABIs (10/21): Normal 9. LV thrombus 10. H/o CVA 11. Mitral regurgitation: Likely functional. Echo in 4/23 with moderate-severe MR. EE (5/23): EF 20-25%, RV mildly reduced, no LAA, severe MR with no mitral stenosis, MV mean gradient 2.0 mmHg - TEE (5/23): EF 20-25%, mild RV dysfunction, severe MR.  - Echo (7/24): moderate-severe MR - TEE (10/24): LVEF 20-25%, RV normal, mild MR  12. PE: Diagnosed [facility] in 5/24. 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   ROS: All systems negative except as listed in HPI, PMH and Problem List.  SH:  Social History   Socioeconomic History   Marital status: Single    Spouse name: Not on file   Number of children: 0   Years of education: 9   Highest education level: 9th grade  Occupational History   Not on file  Tobacco Use   Smoking status: Every Day    Current packs/day: 0.50    Average packs/day: 0.5 packs/day for 47.2 years (23.6 ttl pk-yrs)    Types: Cigarettes, Cigars    Start date: 07/21/1977   Smokeless tobacco: Never  Vaping Use   Vaping status: Never Used   Substance and Sexual Activity   Alcohol use: Yes    Comment: 09/17/2017 nothing since 05/2017   Drug use: Not Currently    Comment: previously incarcerated for drug related offense.   Sexual activity: Not Currently  Other Topics Concern   Not on file  Social History Narrative   Not on file   Social Drivers of Health   Financial Resource Strain: Low Risk (02/25/2023)   Received from [facility]   Overall Financial Resource Strain (CARDIA)    Difficulty of Paying Living Expenses: Not hard at all  Food Insecurity: No Food Insecurity (04/09/2024)   Hunger Vital Sign    Worried About Running Out of Food in the Last Year: Never  true    Ran Out of Food in the Last Year: Never true  Transportation Needs: No Transportation Needs (04/09/2024)   PRAPARE - Administrator, Civil Service (Medical): No    Lack of Transportation (Non-Medical): No  Recent Concern: Transportation Needs - Unmet Transportation Needs (01/22/2024)   PRAPARE - Administrator, Civil Service (Medical): Yes    Lack of Transportation (Non-Medical): Yes  Physical Activity: Not on file  Stress: Not on file  Social Connections: Moderately Isolated (04/09/2024)   Social Connection and Isolation Panel    Frequency of Communication with Friends and Family: More than three times a week    Frequency of Social Gatherings with Friends and Family: More than three times a week    Attends Religious Services: 1 to 4 times per year    Active Member of [employer] or Organizations: No    Attends Banker Meetings: Never    Marital Status: Never married  Intimate Partner Violence: Not At Risk (04/09/2024)   Humiliation, Afraid, Rape, and Kick questionnaire    Fear of Current or Ex-Partner: No    Emotionally Abused: No    Physically Abused: No    Sexually Abused: No   FH:  Family History  Problem Relation Age of Onset   Stroke Mother    Heart attack Mother    Heart attack Father    Stroke Sister     Heart attack Sister    Heart attack Brother    Stroke Brother    Liver disease Neg Hx    Colon cancer Neg Hx    Past Medical History:  Diagnosis Date   AICD (automatic cardioverter/defibrillator) present    Anxiety    Arthritis    all over (09/17/2017)   Burn    CAD (coronary artery disease)    a. NSTEMI s/p BMS to 1st Diagonal and distal OM2 in 2007; b. STEMI 03/26/12 s/p BMS to RCA; c. NSTEMI 10/2012 : CTO of LCx (unable to open with PCI) and PL branch, mod dz of LAD and diagonal, and preserved LV systolic fxn, Med Rx;  d.  anterior STEMI in 01/2017 with DES to Proximal LAD   CAD in native artery 08/23/2020   Cardiomyopathy EF 35% on cath 06/30/14, new from jan 2015 07/20/2014   Chest pain    CHF (congestive heart failure) (HCC)    Chronic back pain    all over (09/17/2017)   DDD (degenerative disc disease), cervical    Depression    GERD (gastroesophageal reflux disease)    Headache    a few/wk (09/17/2017)   HTN (hypertension)    Hypercholesterolemia    Mental disorder    Myocardial infarction (HCC)    I've had 7 (09/17/2017)   Pulmonary edema    Respiratory failure (HCC)    Sciatic pain    Sleep apnea    Stroke [facility])    a. multiple dating back to 2002; *I''ve had 5; LUE/LLE weaker since (09/17/2017)   Tibia fracture (l) leg   Tobacco abuse    Type 2 diabetes mellitus (HCC)    Unstable angina (HCC)    Current Outpatient Medications  Medication Sig Dispense Refill   acetaminophen  (TYLENOL ) 325 MG tablet Take 650 mg by mouth every 6 (six) hours as needed for mild pain.     albuterol  (VENTOLIN  HFA) 108 (90 Base) MCG/ACT inhaler Inhale 2 puffs into the lungs every 4 (four) hours as needed for wheezing or shortness  of breath. 1 each 3   amiodarone  (PACERONE ) 200 MG tablet TAKE (1) TABLET BY MOUTH ONCE DAILY. 30 tablet 10   apixaban  (ELIQUIS ) 5 MG TABS tablet Take 1 tablet (5 mg total) by mouth 2 (two) times daily for 6 days. 60 tablet 3   digoxin  (LANOXIN ) 0.125  MG tablet TAKE (1) TABLET BY MOUTH ONCE DAILY. 30 tablet 11   ezetimibe  (ZETIA ) 10 MG tablet Take 1 tablet (10 mg total) by mouth daily. 30 tablet 3   gabapentin  (NEURONTIN ) 100 MG capsule Take 100 mg by mouth 2 (two) times daily.     levothyroxine  (SYNTHROID ) 100 MCG tablet Take 1 tablet (100 mcg total) by mouth daily at 6 (six) AM. 30 tablet 0   lidocaine  (LIDODERM ) 5 % Place 1 patch onto the skin every 12 (twelve) hours. Leave off 12 hours     metFORMIN  (GLUCOPHAGE ) 500 MG tablet Take 500 mg by mouth daily with breakfast.     midodrine  (PROAMATINE ) 5 MG tablet Take 5 mg by mouth 3 (three) times daily.     nitroGLYCERIN  (NITROSTAT ) 0.4 MG SL tablet PLACE ONE (1) TABLET UNDER TONGUE EVERY 5 MINUTES UP TO (3) DOSES AS NEEDED FOR CHEST PAIN. IF NO RELIEF, CONTACT MD. 25 tablet 3   pantoprazole  (PROTONIX ) 40 MG tablet Take 40 mg by mouth daily.     potassium chloride  (KLOR-CON ) 10 MEQ tablet Take 20 mEq ( 2 Tablets) Monday thru Friday Only. Hold on Saturday and Sundays 60 tablet 11   spironolactone  (ALDACTONE ) 25 MG tablet Take 0.5 tablets (12.5 mg total) by mouth daily. 45 tablet 3   Study - COMET-HF - omecamtiv mecarbil 25 mg tablet (2-week run in)(PI-[place]) Take 1 tablet (25 mg total) by mouth 2 (two) times daily. Take around the same time each day, with or without meals. Swallow whole. Do not chew, crush, or split. 70 tablet 0   thiamine  (VITAMIN B-1) 100 MG tablet Take 1 tablet (100 mg total) by mouth daily. 30 tablet 2   torsemide  (DEMADEX ) 20 MG tablet Take 20 mg ( 1 Tablet) Monday thru Friday Only. Hold on Saturdays and Sunday 30 tablet 11   JARDIANCE  10 MG TABS tablet Take 1 tablet (10 mg total) by mouth daily before breakfast. 30 tablet 11   No current facility-administered medications for this encounter.   Wt Readings from Last 3 Encounters:  09/21/24 67.2 kg (148 lb 3.2 oz)  08/17/24 64.1 kg (141 lb 6.4 oz)  07/27/24 65 kg (143 lb 3.2 oz)   BP 110/78   Pulse 69   Ht 5' 6.5 (1.689  m)   Wt 67.2 kg (148 lb 3.2 oz)   SpO2 96%   BMI 23.56 kg/m   PHYSICAL EXAM: General:  NAD. No resp difficulty, walked into clinic HEENT: Normal Neck: Supple. No JVD. Cor: Regular rate & rhythm. No rubs, gallops or murmurs. Lungs: Clear Abdomen: Soft, nontender, nondistended.  Extremities: No cyanosis, clubbing, rash, edema Neuro: Alert & oriented x 3, moves all 4 extremities w/o difficulty. Affect pleasant.  ASSESSMENT & PLAN:  1. Chronic systolic CHF: Ischemic cardiomyopathy, St Jude ICD. Echo in 10/19 with EF 20-25%.  Echo in 5/22 with EF 25%, normal RV.  Echo 1/23 showed EF 25%. R/LHC in 1/23 at [facility] showed no new CAD, preserved CO and low PCWP suggesting over-diuresis. PYP scan obtained and did not suggest TTR amyloidosis. Echo 4/23 showed EF 20-25% with mild LV dilation, no LV thrombus, moderate-severe MR with ERO 0.39 cm^2  by PISA, normal RV, PASP 49. RHC in 5/23 showed RA mean 2, PA 49/12 (31), PCWP mean 21 with prominent v waves to 45 suggesting severe MR, Fick CO/CI 3.81/2.18. Echo 7/24 showed EF 20%, slow flow at apex, severe LV dilation, mild RV dysfunction, mod-severe MR, IVC normal. TEE (10/24): LVEF 20-25%, RV normal, mild MR. Echo 6/25 showed EF 25-30%, normal RV, moderate MR. CPX 9/25, sub-max effort, showed moderate to severe functional impairment. Today, NYHA II, he is not volume overloaded today on exam or by device interrogation., ReDs 32% - Continue torsemide  20 mg MWF. Recent labs reviewed and are stable, K 4.2, SCr 1.36 - Continue Jardiance  10 mg daily. - Continue spironolactone  12.5 mg daily - Continue digoxin  0.125 mg daily. Dig level 0.6 (08/17/24) - Continue midodrine  5 mg tid. - No BP room to add ARB/ARNi. - No beta blocker with soft BP and marginal output. 2. CAD: Extensive prior history of CAD.  He has known total occlusion LCx and PLV.  NSTEMI 10/19 with DES to mid LAD.  Coronary angiography in 9/21 showed stable coronary disease, no intervention was done.   Admission 1/23 to [facility] concerning for ACS, however LHC showed patent stents, no new stents placed. No recent ischemic-type chest pain.  - no ASA with Eliquis  use. - No statin given history of rhabdomyolysis while on statin therapy - Continue Zetia  10 mg daily.   3. LV thrombus: Echo 4/23 showed no LV thrombus.  Warfarin was stopped during one of his admissions at [facility]. No LV thrombus on TEE (5/23). He was started on apixaban  in 5/24 due to PE.  No thrombus noted on most recent echocardiograms - Continue Eliquis  5 mg bid. No bleeding issues. Recent CBC stable 4. Mitral regurgitation: Echo 4/23 showed moderate-severe likely functional MR.  He may benefit from Mitraclip procedure. TEE 5/23 with severe functional MR with restricted posterior leaflet. Elevated PCWP with prominent V waves on RHC in 5/23 suggestive of severe MR.  Patient was seen by structural heart team. He presented for mTEER in 10/24, however TEE showed only mild MR. Procedure aborted.  Last echo in 6/25 showed moderate functional MR.  5. 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  - He is on apixaban .  6. ETOH use: He has cut back. - Continue to recommend complete cessation.  7. NSVT: Now off Coreg  with marginal output and soft BP.  - Continue amiodarone  at 200 mg daily. Recent LFTs and TSH normal. He will need a regular eye exam.  8. Cocaine abuse: He was cocaine positive during 2/24 admission.  Denies current use.  9. PE: Diagnosed in 5/24, on apixaban  (continue long-term). ? Compliance and new LLL PE in 6/25.  - Continue Eliquis  5 mg bid.    Follow up 3 months with Dr. Rolan [facility] FNP-BC 09/21/2024

## 2024-10-05 ENCOUNTER — Emergency Department (HOSPITAL_COMMUNITY)
Admission: EM | Admit: 2024-10-05 | Discharge: 2024-10-05 | Disposition: A | Discharge status: Home / Self Care | Attending: Emergency Medicine | Admitting: Emergency Medicine

## 2024-10-05 ENCOUNTER — Other Ambulatory Visit: Payer: Self-pay

## 2024-10-05 ENCOUNTER — Encounter (HOSPITAL_COMMUNITY): Payer: Self-pay | Admitting: Emergency Medicine

## 2024-10-05 DIAGNOSIS — M150 Primary generalized (osteo)arthritis: Secondary | ICD-10-CM

## 2024-10-05 LAB — CBC WITH DIFFERENTIAL/PLATELET
Abs Immature Granulocytes: 0.01 K/uL (ref 0.00–0.07)
Basophils Absolute: 0 K/uL (ref 0.0–0.1)
Basophils Relative: 0 %
Eosinophils Absolute: 0.1 K/uL (ref 0.0–0.5)
Eosinophils Relative: 1 %
HCT: 42.4 % (ref 39.0–52.0)
Hemoglobin: 13.3 g/dL (ref 13.0–17.0)
Immature Granulocytes: 0 %
Lymphocytes Relative: 21 %
Lymphs Abs: 1.5 K/uL (ref 0.7–4.0)
MCH: 24.6 pg — ABNORMAL LOW (ref 26.0–34.0)
MCHC: 31.4 g/dL (ref 30.0–36.0)
MCV: 78.4 fL — ABNORMAL LOW (ref 80.0–100.0)
Monocytes Absolute: 0.5 K/uL (ref 0.1–1.0)
Monocytes Relative: 7 %
Neutro Abs: 4.9 K/uL (ref 1.7–7.7)
Neutrophils Relative %: 71 %
Platelets: 235 K/uL (ref 150–400)
RBC: 5.41 MIL/uL (ref 4.22–5.81)
RDW: 17.2 % — ABNORMAL HIGH (ref 11.5–15.5)
WBC: 6.9 K/uL (ref 4.0–10.5)
nRBC: 0 % (ref 0.0–0.2)

## 2024-10-05 LAB — BASIC METABOLIC PANEL WITH GFR
Anion gap: 12 (ref 5–15)
BUN: 11 mg/dL (ref 8–23)
CO2: 25 mmol/L (ref 22–32)
Calcium: 9.4 mg/dL (ref 8.9–10.3)
Chloride: 105 mmol/L (ref 98–111)
Creatinine, Ser: 1.15 mg/dL (ref 0.61–1.24)
GFR, Estimated: >60 mL/min (ref >60)
Glucose, Bld: 90 mg/dL (ref 70–99)
Potassium: 4.1 mmol/L (ref 3.5–5.1)
Sodium: 142 mmol/L (ref 135–145)

## 2024-10-05 MED ORDER — HYDROCODONE-ACETAMINOPHEN 5-325 MG PO TABS: 1.0000 | ORAL_TABLET | Freq: Four times a day (QID) | ORAL | 0 refills | Status: AC | PRN

## 2024-10-05 MED ORDER — SODIUM CHLORIDE 0.9 % IV BOLUS
1000.0000 mL | Freq: Once | INTRAVENOUS | Status: AC
  Administered 2024-10-05: 1000 mL via INTRAVENOUS

## 2024-10-05 MED ORDER — HYDROCODONE-ACETAMINOPHEN 5-325 MG PO TABS
1.0000 | ORAL_TABLET | Freq: Once | ORAL | Status: AC
  Administered 2024-10-05: 1 via ORAL
  Filled 2024-10-05: qty 1

## 2024-10-05 NOTE — Discharge Instructions (Addendum)
 You are being prescribed a stronger pain medicine to help relieve your symptoms.  Continue taking the methocarbamol  (muscle relaxer) your orthopedic specialist started you on last week.  Avoid any activity that worsens your pain but stay as active as you comfortably can (so you don't lose strength and develop stiffness in your joints).  Hydrocodone  will make you drowsy - do not drive within 4 hours of taking this medication.  I also recommend using a heating pad for 20 minutes several times daily to your lower back, hip and knee as needed.

## 2024-10-05 NOTE — ED Provider Notes (Signed)
 [place] [facility]ERGENCY DEPARTMENT AT [facility] Provider Note   CSN: [id number] Arrival date & time: 10/05/24  9048     Patient presents with: Leg Pain   Daniel Li is a 68 y.o. male  With a history including hypertension, CHF, CAD with a history of MI, and chronic low back pain secondary to degenerative joint and disc disease, chronic sciatica along with arthritis involving his left hip and right knee presenting for evaluation of increased pain in his low back hip and bilateral lower extremities.  He was seen by an orthopedic specialist 5 days ago at which time he had a steroid injection, he was placed on methocarbamol  and he is anticipating CT scan of the left hip and is being referred to  a spine specialist.  In the interim he presents secondary to increasing pain in his lower back radiating to his bilateral legs.  At baseline he has difficulty ambulating secondary to this pain.  He denies any increasing weakness or numbness in his extremities, no urinary or fecal incontinence or retention.  He has found no alleviators for his symptoms. Denies any new injury or fall.also denies fevers, chills, no IVDU.   Patient currently resides in assisted living facility.   The history is provided by the patient.       Prior to Admission medications   Medication Sig Start Date End Date Taking? Authorizing Provider  amiodarone  (PACERONE ) 200 MG tablet TAKE (1) TABLET BY MOUTH ONCE DAILY. 08/10/24  Yes Colletta Manuelita Garre, PA-C  apixaban  (ELIQUIS ) 5 MG TABS tablet Take 1 tablet (5 mg total) by mouth 2 (two) times daily for 6 days. 06/11/24 08/17/25 Yes Rolan Ezra RAMAN, MD  digoxin  (LANOXIN ) 0.125 MG tablet TAKE (1) TABLET BY MOUTH ONCE DAILY. 09/09/23  Yes Rolan Ezra RAMAN, MD  ezetimibe  (ZETIA ) 10 MG tablet Take 1 tablet (10 mg total) by mouth daily. 03/27/17  Yes BranchDorn FALCON, MD  gabapentin  (NEURONTIN ) 100 MG capsule Take 100 mg by mouth 2 (two) times daily.   Yes [provider]  HYDROcodone -acetaminophen  (NORCO/VICODIN) 5-325 MG tablet Take 1 tablet by mouth every 6 (six) hours as needed for severe pain (pain score 7-10). 10/05/24  Yes Jayveion Stalling, Mliss, PA-C  JARDIANCE  10 MG TABS tablet Take 1 tablet (10 mg total) by mouth daily before breakfast. 07/27/24  Yes McLean, Dalton S, MD  levothyroxine  (SYNTHROID ) 100 MCG tablet Take 1 tablet (100 mcg total) by mouth daily at 6 (six) AM. 03/18/24  Yes Elicia Hamlet, MD  lidocaine  (LIDODERM ) 5 % Place 1 patch onto the skin every 12 (twelve) hours. Leave off 12 hours 05/27/23  Yes [provider]  metFORMIN  (GLUCOPHAGE ) 500 MG tablet Take 500 mg by mouth daily with breakfast.   Yes [provider]  methocarbamol  (ROBAXIN ) 500 MG tablet Take 1,000 mg by mouth 3 (three) times daily. For 10 days 10/01/24 10/11/24 Yes [provider]  pantoprazole  (PROTONIX ) 40 MG tablet Take 40 mg by mouth daily.   Yes [provider]  potassium chloride  (KLOR-CON ) 10 MEQ tablet Take 20 mEq ( 2 Tablets) Monday thru Friday Only. Hold on Saturday and Sundays 08/28/24  Yes [place], Harlene HERO, FNP  spironolactone  (ALDACTONE ) 25 MG tablet Take 0.5 tablets (12.5 mg total) by mouth daily. 07/27/24  Yes Rolan Ezra RAMAN, MD  thiamine  (VITAMIN B-1) 100 MG tablet Take 100 mg by mouth daily.   Yes [provider]  torsemide  (DEMADEX ) 20 MG tablet Take 20 mg (  1 Tablet) Monday thru Friday Only. Hold on Saturdays and Sunday 08/28/24  Yes [place], [place], FNP    Allergies: Porcine (pork) protein-containing drug products    Review of Systems  Constitutional:  Negative for fever.  Respiratory:  Negative for shortness of breath.   Cardiovascular:  Negative for chest pain and leg swelling.  Gastrointestinal:  Negative for abdominal distention, abdominal pain and constipation.  Genitourinary:  Negative for difficulty urinating, dysuria, flank pain, frequency and urgency.  Musculoskeletal:  Positive for arthralgias  and back pain. Negative for gait problem, joint swelling and myalgias.  Skin:  Negative for rash.  Neurological:  Negative for weakness and numbness.    Updated Vital Signs BP 105/80   Pulse 63   Temp 97.8 F (36.6 C) (Oral)   Resp 20   Ht 5' 6.5 (1.689 m)   Wt 67.1 kg   SpO2 95%   BMI 23.53 kg/m   Physical Exam Vitals and nursing note reviewed.  Constitutional:      Appearance: He is well-developed.  HENT:     Head: Normocephalic.  Eyes:     Conjunctiva/sclera: Conjunctivae normal.  Cardiovascular:     Rate and Rhythm: Normal rate.     Pulses: Normal pulses.     Comments: Pedal pulses normal. Pulmonary:     Effort: Pulmonary effort is normal.  Abdominal:     General: Bowel sounds are normal. There is no distension.     Palpations: Abdomen is soft. There is no mass.  Musculoskeletal:        General: No deformity or signs of injury.     Cervical back: Normal range of motion and neck supple.     Lumbar back: Tenderness present. No swelling, edema or spasms.     Comments: Patient has limited flexion of his bilateral knees secondary to pain.  No effusion, no erythema.  Skin:    General: Skin is warm and dry.  Neurological:     Mental Status: He is alert.     Sensory: Sensation is intact. No sensory deficit.     Motor: No tremor or atrophy.     Gait: Gait normal.     Deep Tendon Reflexes:     Reflex Scores:      Patellar reflexes are 2+ on the right side and 2+ on the left side.    Comments: No strength deficit noted in hip and knee flexor and extensor muscle groups.  Ankle flexion and extension intact.     (all labs ordered are listed, but only abnormal results are displayed) Labs Reviewed  CBC WITH DIFFERENTIAL/PLATELET - Abnormal; Notable for the following components:      Result Value   MCV 78.4 (*)    MCH 24.6 (*)    RDW 17.2 (*)    All other components within normal limits  BASIC METABOLIC PANEL WITH GFR    EKG: None  Radiology: No results  found.   Procedures   Medications Ordered in the ED  sodium chloride  0.9 % bolus 1,000 mL (0 mLs Intravenous Stopped 10/05/24 1241)  HYDROcodone -acetaminophen  (NORCO/VICODIN) 5-325 MG per tablet 1 tablet (1 tablet Oral Given 10/05/24 1229)                                    Medical Decision Making Pt presenting with acute on chronic low back, left hip and knee pain with known djd.  He presents  with printed xrays of his recent imaging by his orthopedic specialist - seen 4 days ago in [place],  reports from these films also reviewed through [facility].  No neuro deficit on exam or by history to suggest emergent or surgical presentation.  Discussed worsened sx that should prompt immediate re-evaluation including distal weakness, bowel/bladder retention/incontinence. Prescribed short course of hydrocodone ,  advised to continue taking robaxin .  Return precautions outlined,  plan f/u with his orthopedic specialist as needed.        Amount and/or Complexity of Data Reviewed Labs: ordered.  Risk Prescription drug management.        Final diagnoses:  Primary osteoarthritis involving multiple joints    ED Discharge Orders          Ordered    HYDROcodone -acetaminophen  (NORCO/VICODIN) 5-325 MG tablet  Every 6 hours PRN        10/05/24 1420               Caeley Dohrmann, PA-C 10/05/24 1631    Towana Ozell BROCKS, MD 10/05/24 [phone]

## 2024-10-05 NOTE — ED Notes (Signed)
 [facility] called to transport patient. Nurse aware. ETA approx. 30-40 minutes.

## 2024-10-05 NOTE — ED Notes (Signed)
 Pt requested fall risk bracelet before leaving; Pt does use cane at home.

## 2024-10-05 NOTE — ED Triage Notes (Signed)
 Pt c/o of bilateral sciatic pain that is chronic. Pt was seen at [facility] recently for the same.

## 2024-10-08 ENCOUNTER — Other Ambulatory Visit (HOSPITAL_COMMUNITY): Payer: Self-pay | Admitting: Cardiology

## 2024-10-12 ENCOUNTER — Ambulatory Visit

## 2024-10-14 NOTE — Progress Notes (Signed)
 EPIC Encounter for ICM Monitoring  Patient Name: Daniel Li is a 68 y.o. male Date: 10/14/2024 Primary Care Physican: Rosamond Leta NOVAK, MD Primary Cardiologist: [facility] Electrophysiologist: Mealor 08/10/2022 Office Weight: 140 lbs                11/05/2023 Weight: 150 lbs   03/12/2024 Office Weight: 149 lbs    05/12/2024 Office Weight: 147 lbs      07/27/2024 Office Weight: 143.3 lbs  08/17/2024 Office Weight: 141 lbs (HF clinic visit) 08/25/2024 Weight: 149.8 lbs  (at ALF and MD office)         09/21/2024 Office Weight: 148.3 lbs              Transmission results reviewed.    Since 09/09/2024 ICM Remote Transmission:  CorVue thoracic impedance suggesting normal fluid levels with the exception of possible fluid accumulation from 09/27/2024-10/08/2024.   Prescribed dosage:  Torsemide  20 mg Take 1 tablet(s) (20 mg total) by mouth Mon-Friday only and hold Saturdays and Sundays  Potassium 10 mEq take 2 tablets (20 mEq total) by mouth Mon-Friday only and hold Saturdays and Sundays   Spironolactone  25 mg take 0.5 tablet (12.5 mg total) daily   Labs: 10/05/2024 Creatinine 1.15, BUN 11, Potassium 4.1, Sodium 142, GFR >60 08/17/2024 Creatinine 1.36, BUN 12, Potassium 4.2, Sodium 137, GFR 47 08/12/2024 Creatinine 1.42, BUN 14, Potassium 4.8, Sodium 138, GFR 54 07/27/2024 Creatinine 1.28, BUN 13, Potassium 4.1, Sodium 138, GFR >60 06/21/2024 Creatinine 1.27, BUN 14, Potassium 3.4, Sodium 142, GFR >60 06/08/2024 Creatinine 1.13, BUN 15, Potassium 3.9, Sodium 143 05/12/2024 Creatinine 1.36, BUN 17, Potassium 4.0, Sodium 138, GFR 57  04/14/2024 Creatinine 1.10, BUN 16, Potassium 3.9, Sodium 141, GFR >60  04/12/2024 Creatinine 1.04, BUN 15, Potassium 3.8, Sodium 138, GFR >60  04/10/2024 Creatinine 1.11, BUN 12, Potassium 4.0, Sodium 141, GFR >60 04/09/2024 Creatinine 1.25, BUN 12, Potassium 3.0, Sodium 140, GFR >60  04/06/2024 Creatinine 1.65, BUN 8,   Potassium 4.2, Sodium  143, GFR 45  A complete set of results can be found in Results Review.   Recommendations:  No changes.    Follow-up plan: ICM clinic phone appointment on 11/17/2024.    91 day device clinic remote transmission 11/09/2024.     EP/Cardiology Office Visits:  Recall 11/20/2024 with Dr Shuck.    Recall 03/25/2025 with EP APP.   Copy of ICM check sent to Dr. Nancey.    Remote monitoring is medically necessary for Heart Failure Management.    Daily Thoracic Impedance ICM trend: 07/14/2024 through 10/12/2024.    12-14 Month Thoracic Impedance ICM trend:     [name], RN 10/14/2024 8:52 AM

## 2024-10-20 ENCOUNTER — Encounter: Payer: Self-pay | Admitting: Cardiology

## 2024-11-09 ENCOUNTER — Ambulatory Visit (INDEPENDENT_AMBULATORY_CARE_PROVIDER_SITE_OTHER): Payer: Medicare Other

## 2024-11-09 DIAGNOSIS — I4729 Other ventricular tachycardia: Secondary | ICD-10-CM | POA: Diagnosis not present

## 2024-11-11 LAB — CUP PACEART REMOTE DEVICE CHECK
Battery Remaining Longevity: 42 mo
Battery Remaining Percentage: 39 %
Battery Voltage: 2.93 V
Brady Statistic RV Percent Paced: 1 %
Date Time Interrogation Session: 20260114122630
HighPow Impedance: 65 Ohm
HighPow Impedance: 65 Ohm
Implantable Lead Connection Status: 753985
Implantable Lead Implant Date: 20181120
Implantable Lead Location: 753860
Implantable Pulse Generator Implant Date: 20181120
Lead Channel Impedance Value: 350 Ohm
Lead Channel Pacing Threshold Amplitude: 1.25 V
Lead Channel Pacing Threshold Pulse Width: 0.5 ms
Lead Channel Sensing Intrinsic Amplitude: 12 mV
Lead Channel Setting Pacing Amplitude: 2.5 V
Lead Channel Setting Pacing Pulse Width: 0.5 ms
Lead Channel Setting Sensing Sensitivity: 0.5 mV
Pulse Gen Serial Number: 9786940
Zone Setting Status: 755011

## 2024-11-12 NOTE — Progress Notes (Signed)
 Remote ICD Transmission

## 2024-11-13 ENCOUNTER — Ambulatory Visit: Payer: Self-pay | Admitting: Cardiovascular Disease

## 2024-11-17 ENCOUNTER — Ambulatory Visit

## 2024-11-17 DIAGNOSIS — I5022 Chronic systolic (congestive) heart failure: Secondary | ICD-10-CM

## 2024-11-17 DIAGNOSIS — Z95810 Presence of automatic (implantable) cardiac defibrillator: Secondary | ICD-10-CM

## 2024-11-19 NOTE — Progress Notes (Signed)
 EPIC Encounter for ICM Monitoring  Patient Name: Daniel Li is a 69 y.o. male Date: 11/19/2024 Primary Care Physican: Rosamond Leta NOVAK, MD Primary Cardiologist: [facility] Electrophysiologist: Mealor 08/10/2022 Office Weight: 140 lbs                11/05/2023 Weight: 150 lbs   03/12/2024 Office Weight: 149 lbs    05/12/2024 Office Weight: 147 lbs      07/27/2024 Office Weight: 143.3 lbs  08/17/2024 Office Weight: 141 lbs (HF clinic visit) 08/25/2024 Weight: 149.8 lbs  (at ALF and MD office)         09/21/2024 Office Weight: 148.3 lbs              Transmission results reviewed.    Since 10/12/2024 ICM Remote Transmission:  CorVue thoracic impedance suggesting normal fluid levels with the exception of possible days of dryness.   Prescribed dosage:  Torsemide  20 mg Take 1 tablet(s) (20 mg total) by mouth Mon-Friday only and hold Saturdays and Sundays  Potassium 10 mEq take 2 tablets (20 mEq total) by mouth Mon-Friday only and hold Saturdays and Sundays   Spironolactone  25 mg take 0.5 tablet (12.5 mg total) daily   Labs: 10/05/2024 Creatinine 1.15, BUN 11, Potassium 4.1, Sodium 142, GFR >60 08/17/2024 Creatinine 1.36, BUN 12, Potassium 4.2, Sodium 137, GFR 47 08/12/2024 Creatinine 1.42, BUN 14, Potassium 4.8, Sodium 138, GFR 54 07/27/2024 Creatinine 1.28, BUN 13, Potassium 4.1, Sodium 138, GFR >60 06/21/2024 Creatinine 1.27, BUN 14, Potassium 3.4, Sodium 142, GFR >60 06/08/2024 Creatinine 1.13, BUN 15, Potassium 3.9, Sodium 143 05/12/2024 Creatinine 1.36, BUN 17, Potassium 4.0, Sodium 138, GFR 57  04/14/2024 Creatinine 1.10, BUN 16, Potassium 3.9, Sodium 141, GFR >60  04/12/2024 Creatinine 1.04, BUN 15, Potassium 3.8, Sodium 138, GFR >60  04/10/2024 Creatinine 1.11, BUN 12, Potassium 4.0, Sodium 141, GFR >60 04/09/2024 Creatinine 1.25, BUN 12, Potassium 3.0, Sodium 140, GFR >60  04/06/2024 Creatinine 1.65, BUN 8,   Potassium 4.2, Sodium 143, GFR 45  A complete set of  results can be found in Results Review.   Recommendations:  No changes.    Follow-up plan: ICM clinic phone appointment on 12/21/2024.    91 day device clinic remote transmission 02/08/2025.     EP/Cardiology Office Visits:  Recall 11/20/2024 with Dr Shuck.    Recall 03/25/2025 with EP APP.   Copy of ICM check sent to Dr. Nancey.    Remote monitoring is medically necessary for Heart Failure Management.    Daily Thoracic Impedance ICM trend: 08/19/2024 through 11/17/2024.    12-14 Month Thoracic Impedance ICM trend:     [name], RN 11/19/2024 3:52 PM

## 2024-11-26 NOTE — Progress Notes (Signed)
 31 day ICM Remote transmission canceled due to [facility] clinic is on hold until further notice.  91 day remote monitoring will continue per protocol.

## 2024-12-21 ENCOUNTER — Ambulatory Visit
